# Patient Record
Sex: Male | Born: 1940
Health system: Southern US, Community
[De-identification: ages and names within clinical notes are randomized; demographics above are authoritative.]

## PROBLEM LIST (undated history)

## (undated) DIAGNOSIS — I10 Essential (primary) hypertension: Secondary | ICD-10-CM

## (undated) DIAGNOSIS — M51379 Other intervertebral disc degeneration, lumbosacral region without mention of lumbar back pain or lower extremity pain: Secondary | ICD-10-CM

## (undated) DIAGNOSIS — I82409 Acute embolism and thrombosis of unspecified deep veins of unspecified lower extremity: Secondary | ICD-10-CM

## (undated) DIAGNOSIS — E663 Overweight: Secondary | ICD-10-CM

## (undated) DIAGNOSIS — I2510 Atherosclerotic heart disease of native coronary artery without angina pectoris: Secondary | ICD-10-CM

## (undated) DIAGNOSIS — Z86718 Personal history of other venous thrombosis and embolism: Secondary | ICD-10-CM

## (undated) DIAGNOSIS — M7989 Other specified soft tissue disorders: Secondary | ICD-10-CM

## (undated) DIAGNOSIS — E785 Hyperlipidemia, unspecified: Secondary | ICD-10-CM

## (undated) DIAGNOSIS — N400 Enlarged prostate without lower urinary tract symptoms: Secondary | ICD-10-CM

## (undated) DIAGNOSIS — N401 Enlarged prostate with lower urinary tract symptoms: Secondary | ICD-10-CM

## (undated) DIAGNOSIS — I499 Cardiac arrhythmia, unspecified: Secondary | ICD-10-CM

## (undated) DIAGNOSIS — K5792 Diverticulitis of intestine, part unspecified, without perforation or abscess without bleeding: Secondary | ICD-10-CM

## (undated) DIAGNOSIS — Z7901 Long term (current) use of anticoagulants: Secondary | ICD-10-CM

## (undated) DIAGNOSIS — N529 Male erectile dysfunction, unspecified: Secondary | ICD-10-CM

## (undated) DIAGNOSIS — M5137 Other intervertebral disc degeneration, lumbosacral region: Secondary | ICD-10-CM

## (undated) DIAGNOSIS — I872 Venous insufficiency (chronic) (peripheral): Secondary | ICD-10-CM

## (undated) DIAGNOSIS — R351 Nocturia: Secondary | ICD-10-CM

## (undated) DIAGNOSIS — I639 Cerebral infarction, unspecified: Secondary | ICD-10-CM

## (undated) DIAGNOSIS — K219 Gastro-esophageal reflux disease without esophagitis: Secondary | ICD-10-CM

## (undated) DIAGNOSIS — L309 Dermatitis, unspecified: Secondary | ICD-10-CM

## (undated) HISTORY — DX: Essential (primary) hypertension: I10

## (undated) HISTORY — DX: Benign prostatic hyperplasia with lower urinary tract symptoms: N40.1

## (undated) HISTORY — DX: Hyperlipidemia, unspecified: E78.5

## (undated) HISTORY — DX: Benign prostatic hyperplasia without lower urinary tract symptoms: N40.0

## (undated) HISTORY — DX: Cerebral infarction, unspecified: I63.9

## (undated) HISTORY — PX: OTHER SURGICAL HISTORY: SHX169

## (undated) HISTORY — DX: Atherosclerotic heart disease of native coronary artery without angina pectoris: I25.10

## (undated) HISTORY — DX: Nocturia: R35.1

## (undated) HISTORY — DX: Gastro-esophageal reflux disease without esophagitis: K21.9

## (undated) HISTORY — PX: COLON SURGERY: SHX602

## (undated) HISTORY — DX: Dermatitis, unspecified: L30.9

## (undated) HISTORY — PX: EYE SURGERY: SHX253

## (undated) HISTORY — PX: CORONARY ANGIOPLASTY: SHX604

## (undated) HISTORY — PX: CARDIAC CATHETERIZATION: SHX172

## (undated) HISTORY — DX: Diverticulitis of intestine, part unspecified, without perforation or abscess without bleeding: K57.92

## (undated) HISTORY — DX: Other intervertebral disc degeneration, lumbosacral region without mention of lumbar back pain or lower extremity pain: M51.379

## (undated) HISTORY — DX: Venous insufficiency (chronic) (peripheral): I87.2

## (undated) HISTORY — DX: Long term (current) use of anticoagulants: Z79.01

## (undated) HISTORY — DX: Other specified soft tissue disorders: M79.89

## (undated) HISTORY — PX: TONSILLECTOMY AND ADENOIDECTOMY: SUR1326

## (undated) HISTORY — PX: NASAL SEPTUM SURGERY: SHX37

## (undated) HISTORY — DX: Other intervertebral disc degeneration, lumbosacral region: M51.37

## (undated) HISTORY — DX: Overweight: E66.3

## (undated) HISTORY — DX: Personal history of other venous thrombosis and embolism: Z86.718

## (undated) HISTORY — DX: Male erectile dysfunction, unspecified: N52.9

---

## 2000-11-15 ENCOUNTER — Encounter: Payer: Self-pay | Admitting: Gastroenterology

## 2000-11-15 ENCOUNTER — Encounter (INDEPENDENT_AMBULATORY_CARE_PROVIDER_SITE_OTHER): Payer: Self-pay

## 2000-11-15 ENCOUNTER — Other Ambulatory Visit: Admission: RE | Admit: 2000-11-15 | Discharge: 2000-11-15 | Payer: Self-pay | Admitting: Gastroenterology

## 2001-01-05 ENCOUNTER — Encounter: Payer: Self-pay | Admitting: Family Medicine

## 2001-01-05 ENCOUNTER — Inpatient Hospital Stay (HOSPITAL_COMMUNITY): Admission: EM | Admit: 2001-01-05 | Discharge: 2001-01-07 | Payer: Self-pay | Admitting: Emergency Medicine

## 2001-01-05 ENCOUNTER — Encounter: Admission: RE | Admit: 2001-01-05 | Discharge: 2001-01-05 | Payer: Self-pay | Admitting: Family Medicine

## 2004-03-29 ENCOUNTER — Ambulatory Visit: Payer: Self-pay | Admitting: Family Medicine

## 2004-04-19 ENCOUNTER — Ambulatory Visit: Payer: Self-pay | Admitting: Family Medicine

## 2004-04-30 ENCOUNTER — Ambulatory Visit: Payer: Self-pay | Admitting: Family Medicine

## 2004-06-07 ENCOUNTER — Ambulatory Visit: Payer: Self-pay | Admitting: Family Medicine

## 2004-12-28 ENCOUNTER — Ambulatory Visit: Payer: Self-pay | Admitting: Family Medicine

## 2005-01-04 ENCOUNTER — Ambulatory Visit: Payer: Self-pay | Admitting: Family Medicine

## 2005-07-13 ENCOUNTER — Ambulatory Visit: Payer: Self-pay | Admitting: Family Medicine

## 2005-07-18 ENCOUNTER — Ambulatory Visit: Payer: Self-pay | Admitting: Family Medicine

## 2005-07-20 ENCOUNTER — Ambulatory Visit: Payer: Self-pay | Admitting: Family Medicine

## 2005-07-25 ENCOUNTER — Ambulatory Visit: Payer: Self-pay | Admitting: Family Medicine

## 2005-08-04 ENCOUNTER — Ambulatory Visit: Payer: Self-pay | Admitting: Family Medicine

## 2005-08-23 ENCOUNTER — Ambulatory Visit: Payer: Self-pay | Admitting: Family Medicine

## 2005-08-26 ENCOUNTER — Ambulatory Visit: Payer: Self-pay | Admitting: Family Medicine

## 2005-09-01 ENCOUNTER — Ambulatory Visit: Payer: Self-pay | Admitting: Family Medicine

## 2005-10-03 ENCOUNTER — Ambulatory Visit: Payer: Self-pay | Admitting: Family Medicine

## 2005-10-27 ENCOUNTER — Ambulatory Visit: Payer: Self-pay | Admitting: Family Medicine

## 2005-11-17 ENCOUNTER — Ambulatory Visit: Payer: Self-pay | Admitting: Family Medicine

## 2006-01-10 ENCOUNTER — Ambulatory Visit: Payer: Self-pay | Admitting: Family Medicine

## 2006-01-10 LAB — CONVERTED CEMR LAB
ALT: 25 units/L (ref 0–40)
AST: 31 units/L (ref 0–37)
Albumin: 3.8 g/dL (ref 3.5–5.2)
Alkaline Phosphatase: 44 units/L (ref 39–117)
BUN: 12 mg/dL (ref 6–23)
Basophils Absolute: 0 10*3/uL (ref 0.0–0.1)
Basophils Relative: 0.9 % (ref 0.0–1.0)
CO2: 27 meq/L (ref 19–32)
Calcium: 9.5 mg/dL (ref 8.4–10.5)
Chloride: 109 meq/L (ref 96–112)
Chol/HDL Ratio, serum: 2.6
Cholesterol: 144 mg/dL (ref 0–200)
Creatinine, Ser: 0.9 mg/dL (ref 0.4–1.5)
Eosinophil percent: 2 % (ref 0.0–5.0)
GFR calc non Af Amer: 90 mL/min
Glomerular Filtration Rate, Af Am: 109 mL/min/{1.73_m2}
Glucose, Bld: 94 mg/dL (ref 70–99)
HCT: 44.3 % (ref 39.0–52.0)
HDL: 54.4 mg/dL (ref >39.0)
Hemoglobin: 15 g/dL (ref 13.0–17.0)
LDL Cholesterol: 76 mg/dL (ref 0–99)
Lymphocytes Relative: 30.3 % (ref 12.0–46.0)
MCHC: 33.8 g/dL (ref 30.0–36.0)
MCV: 84.5 fL (ref 78.0–100.0)
Monocytes Absolute: 0.5 10*3/uL (ref 0.2–0.7)
Monocytes Relative: 12 % — ABNORMAL HIGH (ref 3.0–11.0)
Neutro Abs: 2.5 10*3/uL (ref 1.4–7.7)
Neutrophils Relative %: 54.8 % (ref 43.0–77.0)
PSA: 1.12 ng/mL
PSA: 1.12 ng/mL
PSA: 1.12 ng/mL (ref 0.10–4.00)
Platelets: 198 10*3/uL (ref 150–400)
Potassium: 4.1 meq/L (ref 3.5–5.1)
RBC: 5.25 M/uL (ref 4.22–5.81)
RDW: 14 % (ref 11.5–14.6)
Sodium: 142 meq/L (ref 135–145)
TSH: 1.68 microintl units/mL (ref 0.35–5.50)
Total Bilirubin: 0.9 mg/dL (ref 0.3–1.2)
Total Protein: 6.3 g/dL (ref 6.0–8.3)
Triglyceride fasting, serum: 68 mg/dL (ref 0–149)
VLDL: 14 mg/dL (ref 0–40)
WBC: 4.5 10*3/uL (ref 4.5–10.5)

## 2006-01-16 ENCOUNTER — Ambulatory Visit: Payer: Self-pay | Admitting: Family Medicine

## 2006-08-04 ENCOUNTER — Encounter: Payer: Self-pay | Admitting: Family Medicine

## 2006-08-04 DIAGNOSIS — K219 Gastro-esophageal reflux disease without esophagitis: Secondary | ICD-10-CM | POA: Insufficient documentation

## 2006-08-04 DIAGNOSIS — E785 Hyperlipidemia, unspecified: Secondary | ICD-10-CM | POA: Insufficient documentation

## 2006-08-04 DIAGNOSIS — I10 Essential (primary) hypertension: Secondary | ICD-10-CM | POA: Insufficient documentation

## 2006-08-04 HISTORY — DX: Gastro-esophageal reflux disease without esophagitis: K21.9

## 2006-09-13 ENCOUNTER — Ambulatory Visit: Payer: Self-pay | Admitting: Family Medicine

## 2006-10-25 ENCOUNTER — Ambulatory Visit: Payer: Self-pay | Admitting: Family Medicine

## 2006-10-25 DIAGNOSIS — I82409 Acute embolism and thrombosis of unspecified deep veins of unspecified lower extremity: Secondary | ICD-10-CM | POA: Insufficient documentation

## 2006-10-25 LAB — CONVERTED CEMR LAB
INR: 2.5
Prothrombin Time: 19.2 s

## 2006-11-24 ENCOUNTER — Ambulatory Visit: Payer: Self-pay | Admitting: Family Medicine

## 2006-11-24 LAB — CONVERTED CEMR LAB
INR: 1.9
Prothrombin Time: 16.8 s

## 2006-12-27 ENCOUNTER — Ambulatory Visit: Payer: Self-pay | Admitting: Family Medicine

## 2006-12-27 LAB — CONVERTED CEMR LAB
INR: 2.2
Prothrombin Time: 18 s

## 2007-02-13 ENCOUNTER — Ambulatory Visit: Payer: Self-pay | Admitting: Family Medicine

## 2007-02-13 IMAGING — CR DG LUMBAR SPINE COMPLETE 4+V
5 series · 5 of 5 positions shown · non-contrast
Comparison: none

This report is delayed due to PACS failure.
CLINICAL DATA: Injured several weeks ago with right-sided pain radiating down right leg.
 LUMBAR SPINE ? 4 VIEW:

[view not recorded (1 of 5)]
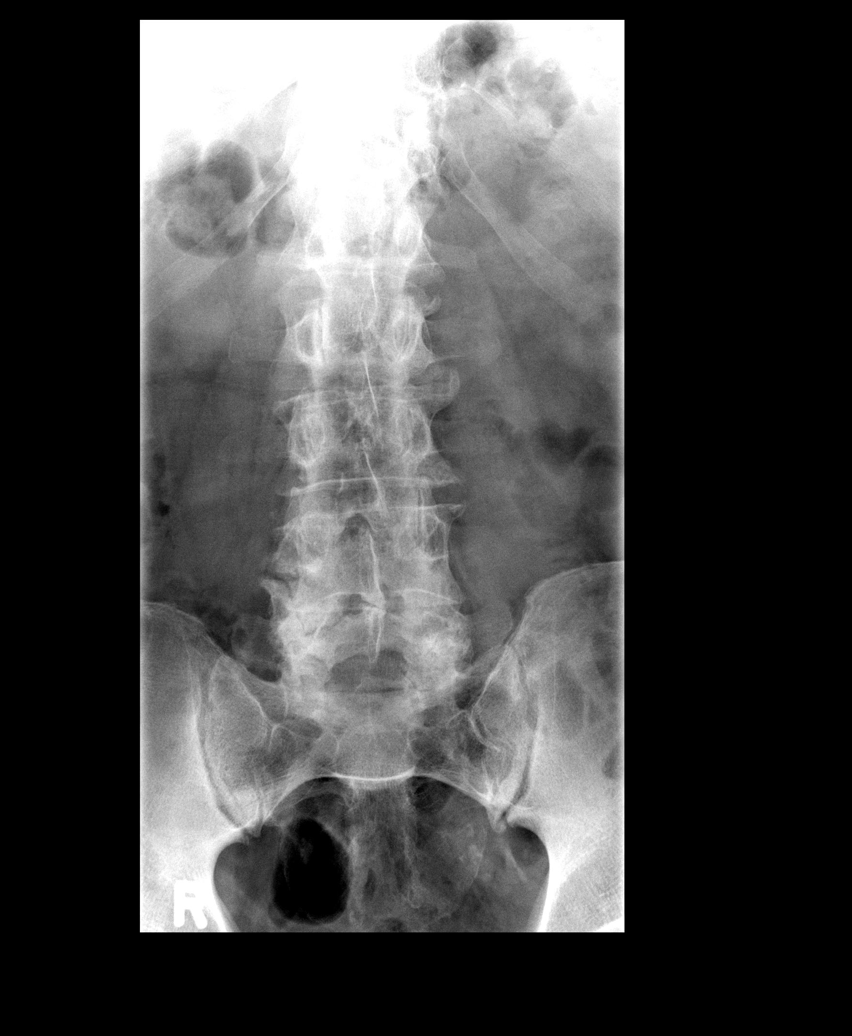

[view not recorded (2 of 5)]
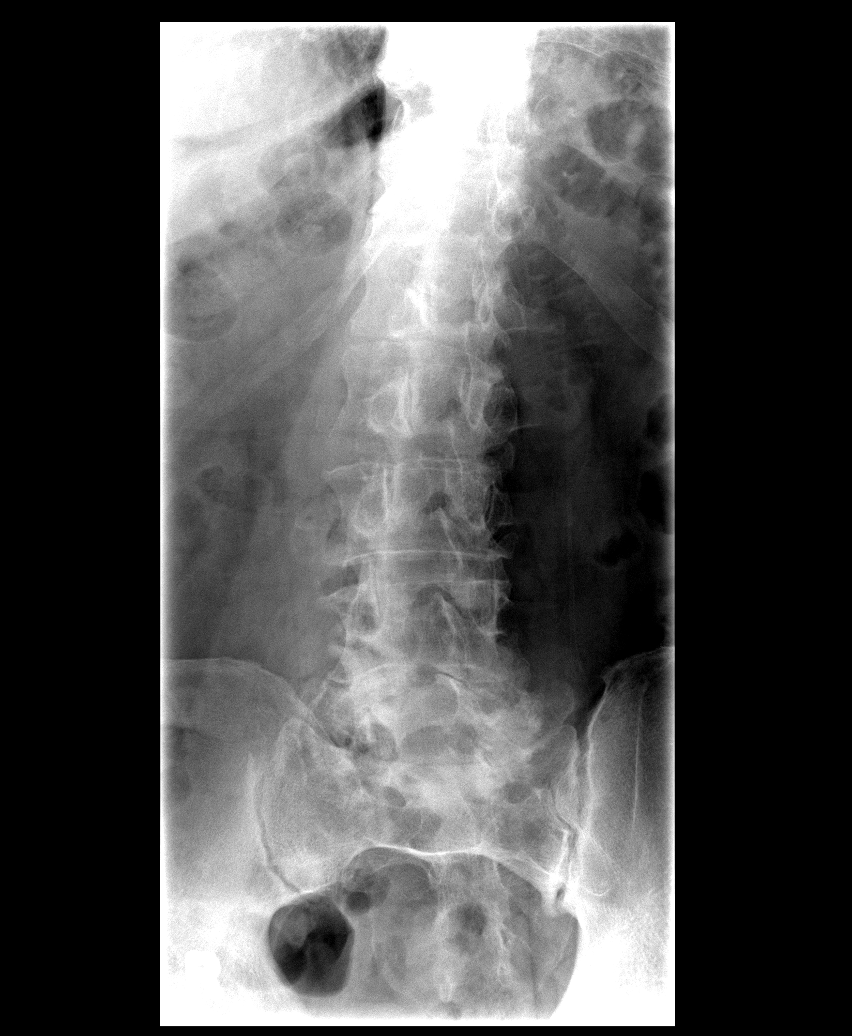

[view not recorded (3 of 5)]
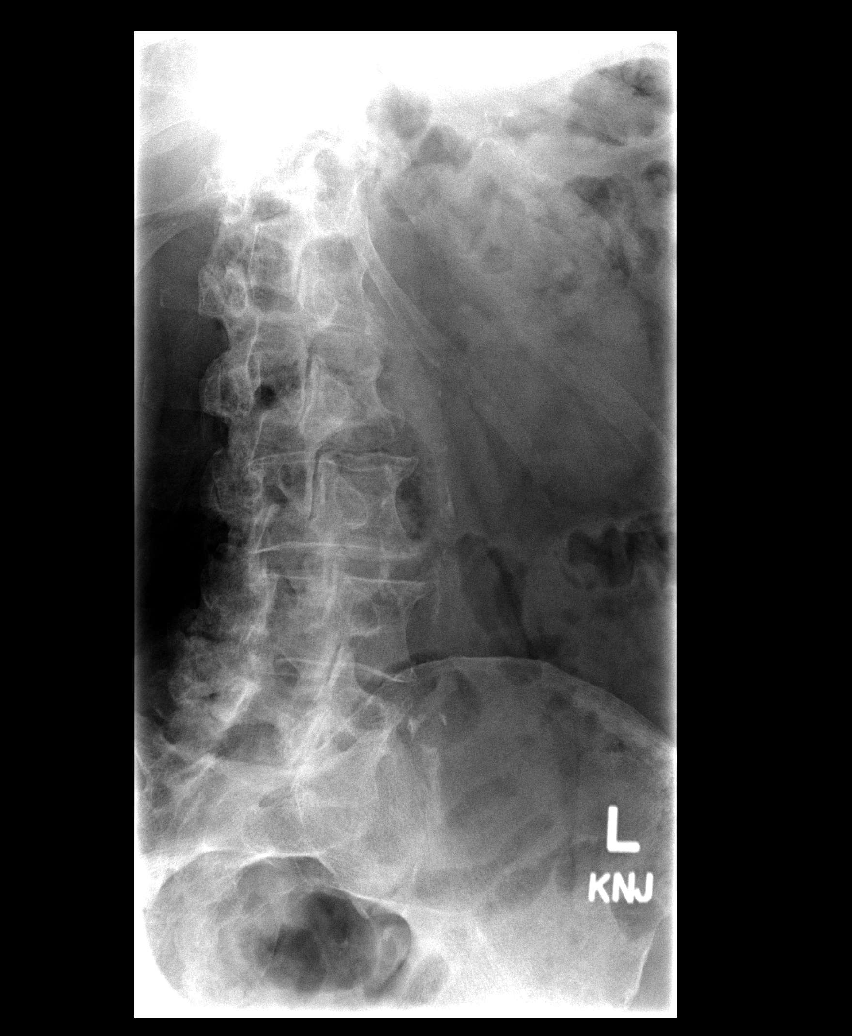

[view not recorded (4 of 5)]
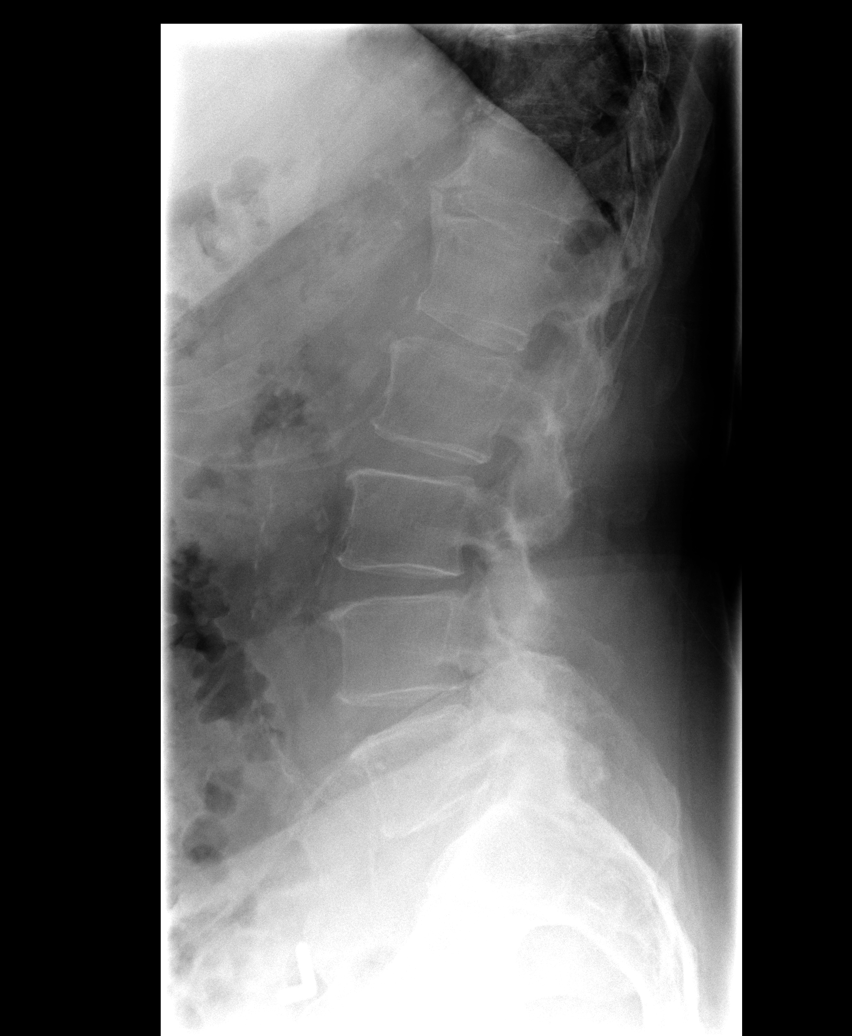

[view not recorded (5 of 5)]
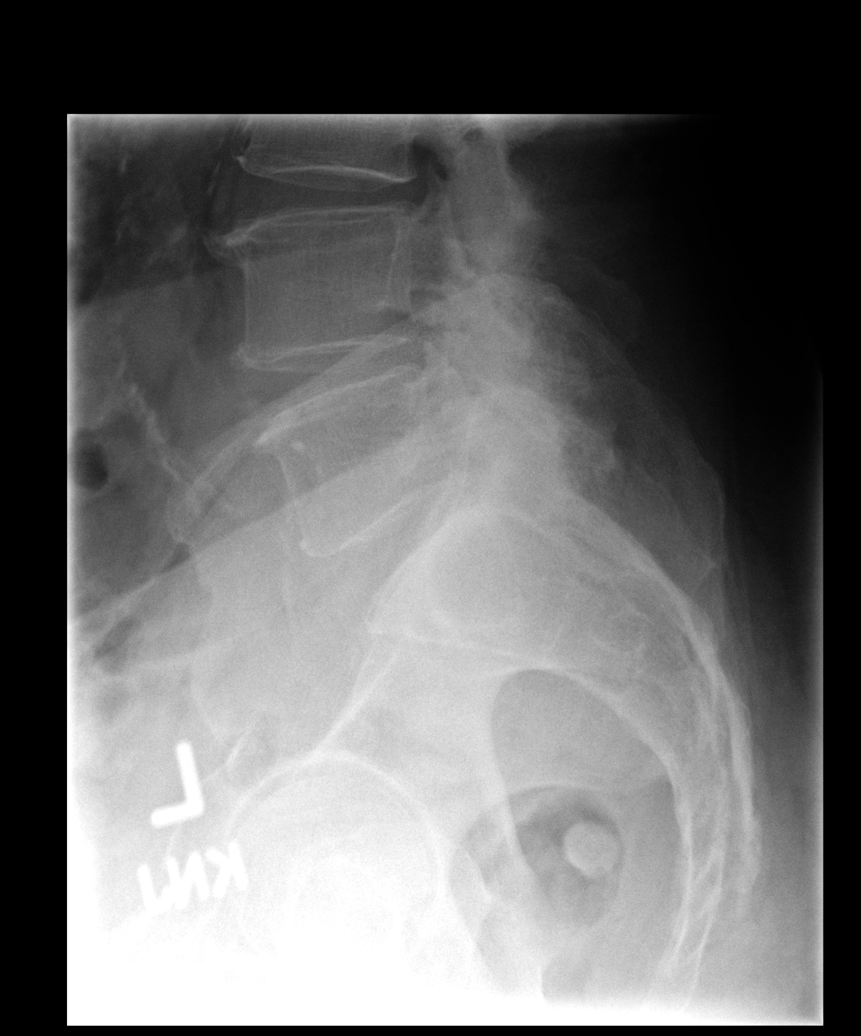

[5 of 5 positions shown; findings below may reference images not displayed]

FINDINGS: Four views of the lumbar spine were obtained.  The lumbar vertebrae are in normal alignment.  No compression deformity is seen. There is degenerative change involving the facet joints particularly of L4-5 and L5-S1. The SI joints appear normal.
IMPRESSION: 1.  Normal alignment with no acute abnormality.
 2.  Degenerative change involves facet joints of L4-5 and L5-S1.

## 2007-02-14 DIAGNOSIS — M5137 Other intervertebral disc degeneration, lumbosacral region: Secondary | ICD-10-CM | POA: Insufficient documentation

## 2007-02-14 DIAGNOSIS — M5416 Radiculopathy, lumbar region: Secondary | ICD-10-CM | POA: Insufficient documentation

## 2007-02-20 ENCOUNTER — Ambulatory Visit: Payer: Self-pay | Admitting: Family Medicine

## 2007-05-04 ENCOUNTER — Encounter: Payer: Self-pay | Admitting: Family Medicine

## 2007-05-07 ENCOUNTER — Ambulatory Visit: Payer: Self-pay | Admitting: Family Medicine

## 2007-05-07 LAB — CONVERTED CEMR LAB
INR: 1.6
Prothrombin Time: 15.7 s

## 2007-08-09 ENCOUNTER — Ambulatory Visit: Payer: Self-pay | Admitting: Family Medicine

## 2007-08-09 LAB — CONVERTED CEMR LAB
INR: 1.8
Prothrombin Time: 16.7 s

## 2007-08-28 ENCOUNTER — Ambulatory Visit: Payer: Self-pay | Admitting: Family Medicine

## 2007-08-28 LAB — CONVERTED CEMR LAB: Zinc: 1012 (ref 600–1200)

## 2007-11-12 ENCOUNTER — Ambulatory Visit: Payer: Self-pay | Admitting: Family Medicine

## 2007-11-12 LAB — CONVERTED CEMR LAB
INR: 2
Prothrombin Time: 17.5 s

## 2007-11-20 ENCOUNTER — Ambulatory Visit: Payer: Self-pay | Admitting: Family Medicine

## 2008-02-18 ENCOUNTER — Ambulatory Visit: Payer: Self-pay | Admitting: Family Medicine

## 2008-02-27 ENCOUNTER — Ambulatory Visit: Payer: Self-pay | Admitting: Family Medicine

## 2008-02-29 ENCOUNTER — Ambulatory Visit: Payer: Self-pay | Admitting: Family Medicine

## 2008-02-29 DIAGNOSIS — L02818 Cutaneous abscess of other sites: Secondary | ICD-10-CM | POA: Insufficient documentation

## 2008-02-29 DIAGNOSIS — L03818 Cellulitis of other sites: Secondary | ICD-10-CM

## 2008-03-03 ENCOUNTER — Ambulatory Visit: Payer: Self-pay | Admitting: Family Medicine

## 2008-03-10 ENCOUNTER — Ambulatory Visit: Payer: Self-pay | Admitting: Family Medicine

## 2008-03-10 DIAGNOSIS — R609 Edema, unspecified: Secondary | ICD-10-CM | POA: Insufficient documentation

## 2008-03-12 DIAGNOSIS — D126 Benign neoplasm of colon, unspecified: Secondary | ICD-10-CM | POA: Insufficient documentation

## 2008-03-12 DIAGNOSIS — K5730 Diverticulosis of large intestine without perforation or abscess without bleeding: Secondary | ICD-10-CM | POA: Insufficient documentation

## 2008-03-17 ENCOUNTER — Ambulatory Visit: Payer: Self-pay | Admitting: Gastroenterology

## 2008-03-17 DIAGNOSIS — D68318 Other hemorrhagic disorder due to intrinsic circulating anticoagulants, antibodies, or inhibitors: Secondary | ICD-10-CM | POA: Insufficient documentation

## 2008-03-24 ENCOUNTER — Ambulatory Visit: Payer: Self-pay | Admitting: Family Medicine

## 2008-03-25 ENCOUNTER — Ambulatory Visit: Payer: Self-pay | Admitting: Gastroenterology

## 2008-03-25 ENCOUNTER — Telehealth: Payer: Self-pay | Admitting: Gastroenterology

## 2008-03-27 LAB — CONVERTED CEMR LAB
BUN: 13 mg/dL (ref 6–23)
CO2: 30 meq/L (ref 19–32)
Calcium: 9.9 mg/dL (ref 8.4–10.5)
Chloride: 106 meq/L (ref 96–112)
Creatinine, Ser: 1 mg/dL (ref 0.4–1.5)
GFR calc Af Amer: 96 mL/min
GFR calc non Af Amer: 79 mL/min
Glucose, Bld: 75 mg/dL (ref 70–99)
Potassium: 4.9 meq/L (ref 3.5–5.1)
Sodium: 142 meq/L (ref 135–145)

## 2008-08-07 ENCOUNTER — Ambulatory Visit: Payer: Self-pay | Admitting: Internal Medicine

## 2008-08-07 ENCOUNTER — Inpatient Hospital Stay (HOSPITAL_COMMUNITY): Admission: EM | Admit: 2008-08-07 | Discharge: 2008-08-20 | Payer: Self-pay | Admitting: Emergency Medicine

## 2008-08-07 ENCOUNTER — Encounter (INDEPENDENT_AMBULATORY_CARE_PROVIDER_SITE_OTHER): Payer: Self-pay | Admitting: General Surgery

## 2008-08-07 IMAGING — CT CT PELVIS W/ CM
2 of 5 series · 13 of 32 positions shown, 18 images · IV contrast (water & 100 ML OMNI 300)
Comparison: None.

CT ABDOMEN

CLINICAL DATA: Severe mid to lower abdominal pain and nausea.
Fever.

CT ABDOMEN AND PELVIS WITH CONTRAST  [DATE]:
TECHNIQUE: Multidetector CT imaging of the abdomen and pelvis was
performed using the standard protocol following bolus
administration of intravenous contrast.
Contrast: 100 ml [WX] IV.  Water as oral contrast.

[Series 2: routine abdomen · axial · 0.88mm/px · z∈[-441,-91]mm · 5 of 103 slices shown, 10 images]
[im 18/103  soft-tissue]
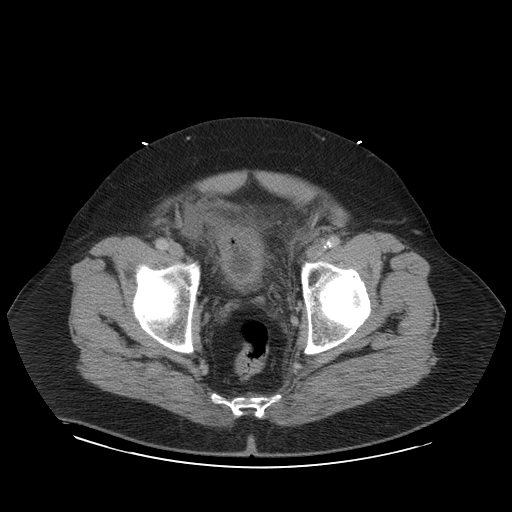
[im 18/103  bone]
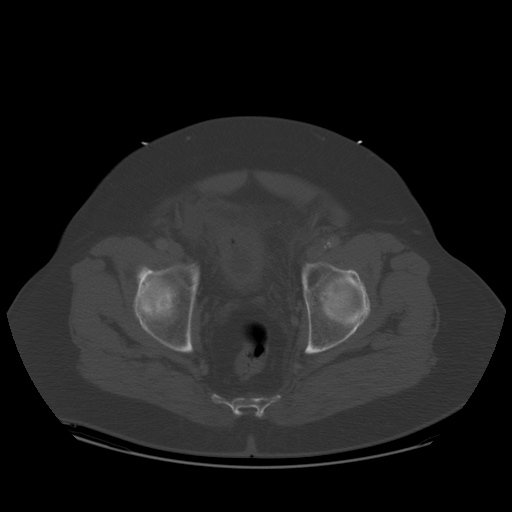
[im 35/103  soft-tissue]
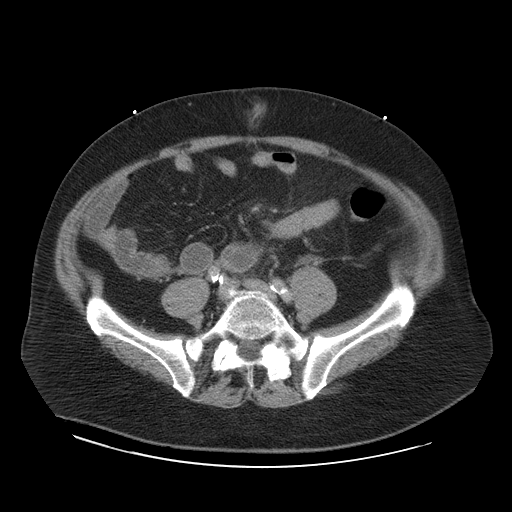
[im 35/103  lung]
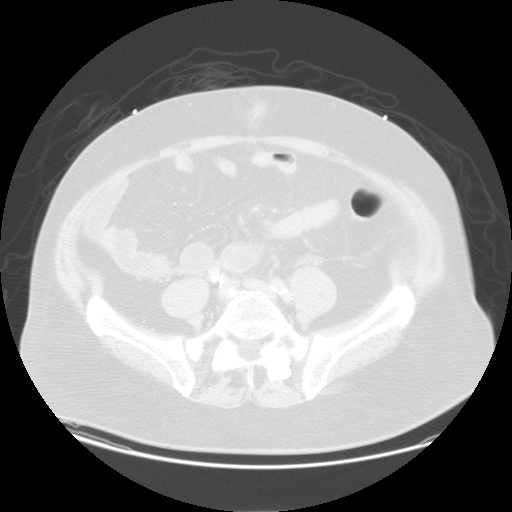
[im 52/103  soft-tissue]
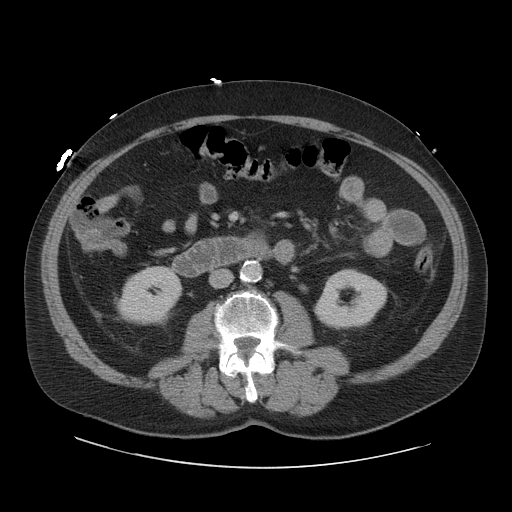
[im 52/103  lung]
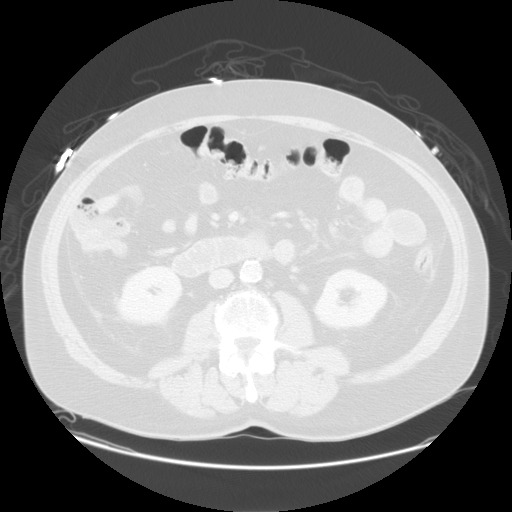
[im 69/103  soft-tissue]
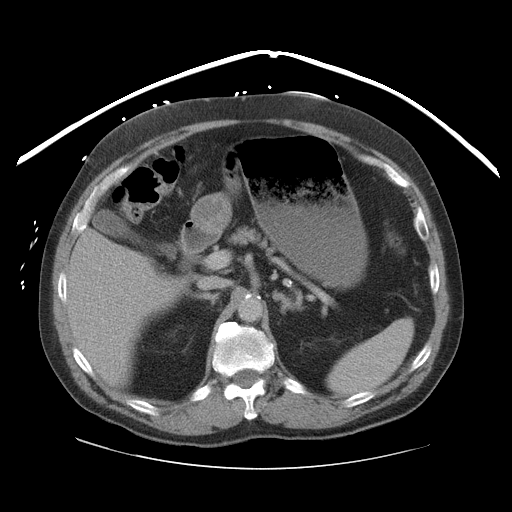
[im 69/103  lung]
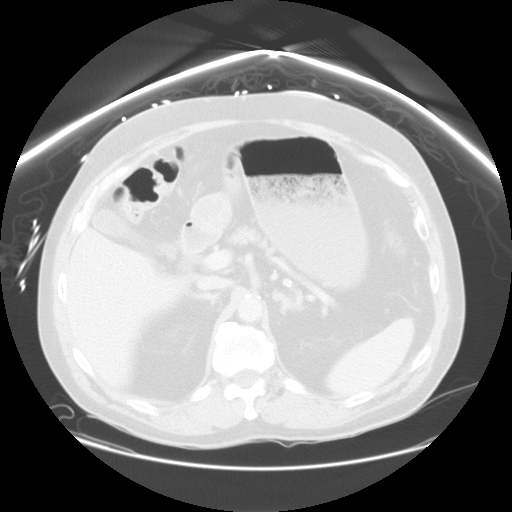
[im 86/103  soft-tissue]
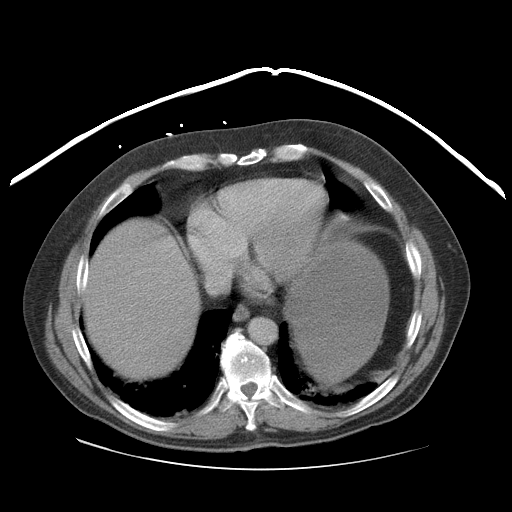
[im 86/103  lung]
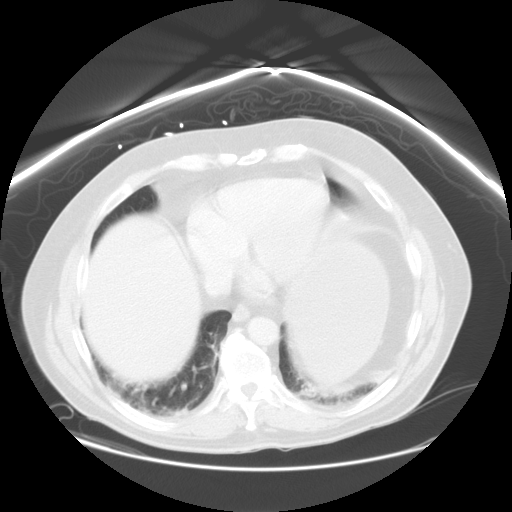

[Series 401: reformatted · sagittal · 1.08mm/px · 8 of 142 slices shown]
[im 16/142  soft-tissue]
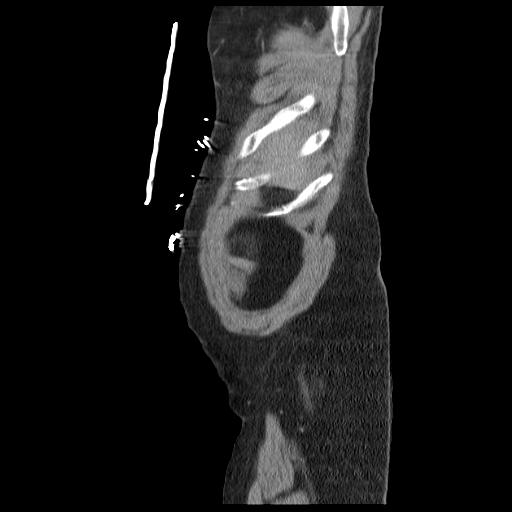
[im 32/142  soft-tissue]
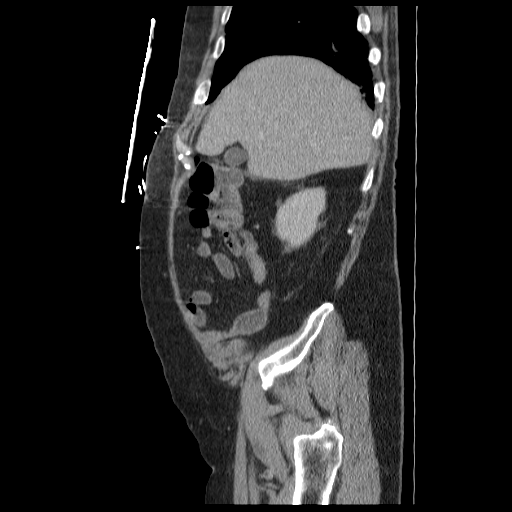
[im 48/142  soft-tissue]
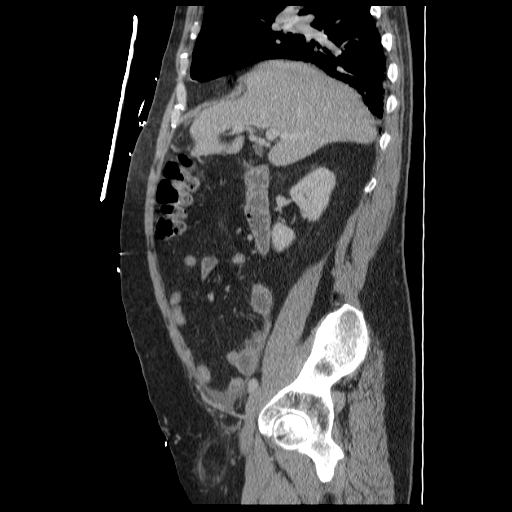
[im 63/142  soft-tissue]
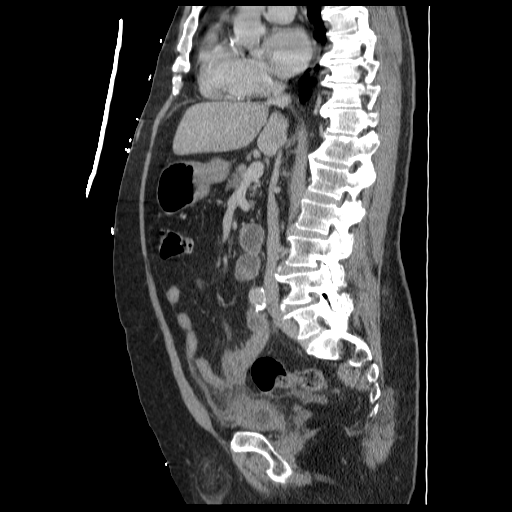
[im 79/142  soft-tissue]
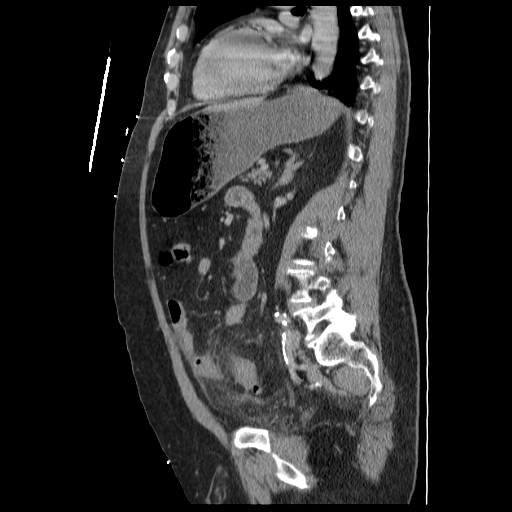
[im 95/142  soft-tissue]
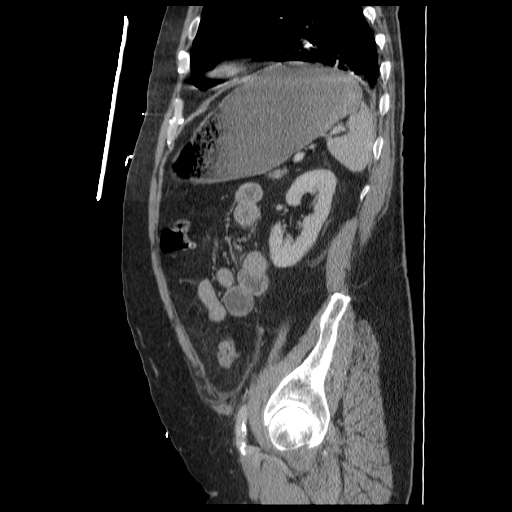
[im 110/142  soft-tissue]
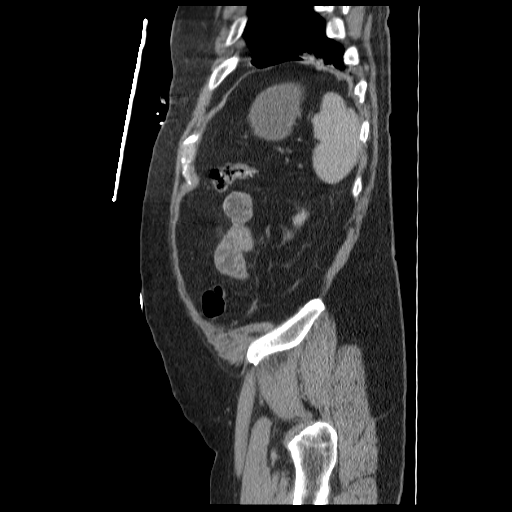
[im 126/142  soft-tissue]
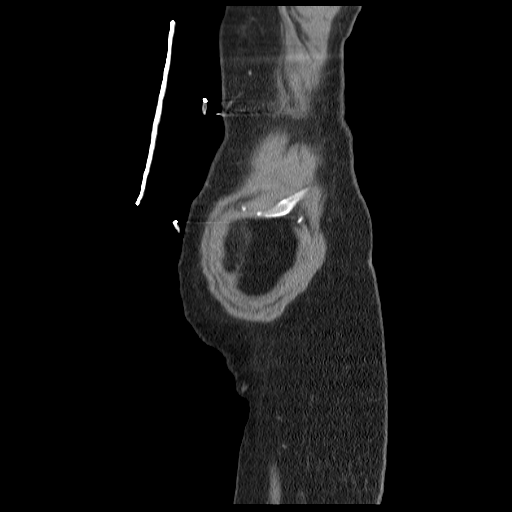

[13 of 32 positions shown; findings below may reference images not displayed]

FINDINGS: Normal appearing liver, spleen, adrenal glands, and the
left kidney.  Mild pancreatic atrophy without focal pancreatic
parenchymal abnormality.  Simple cyst in the lower pole of the
right kidney approximating 2 cm; no significant abnormality
involving the right kidney.  Gallbladder unremarkable by CT.  No
biliary ductal dilation.  Food within a normal-appearing stomach.
Scattered colonic diverticula without evidence of acute
diverticulitis.  Cecum positioned in the right upper quadrant of
the abdomen, and normal appearing appendix identified in the right
mid abdomen.  Visualized small bowel unremarkable.  Aortic
atherosclerosis without aneurysm.  No significant lymphadenopathy.
No ascites.  Emphysematous changes in the lower lobes with
atelectasis and/or scarring.  Bone window images demonstrate lower
thoracic spondylosis and degenerative changes diffusely throughout
the lumbar spine.
IMPRESSION: 1.  No acute abnormalities in the abdomen.
2.  Diffuse colonic diverticulosis.
3.  Mild pancreatic atrophy.
4.  Atelectasis and/or scarring in the lower lobes bilaterally.

CT PELVIS
FINDINGS: Wall thickening and associated inflammation/edema
involving a loop of the distal small bowel in the lower pelvis.
This loop is adjacent to the sigmoid colon, and there are numerous
sigmoid diverticula; however, the inflammatory disease does appear
to arise from the small bowel loop and not the colon.  No evidence
of bowel obstruction.  No extraluminal gas.  No abnormal fluid
collection to suggest abscess.  Urinary bladder decompressed by
Foley catheter.  Mild prostate gland enlargement consistent with
age.  Normal seminal vesicles.  Left inguinal hernia containing fat
and an approximate to 3.1 x 3.9 cm fluid collection.  No ascites.
No significant lymphadenopathy.  Iliofemoral atherosclerosis
without aneurysm.  Bone window images demonstrate mild degenerative
changes in the sacroiliac joints.
IMPRESSION: 1.  Marked inflammation involving a loop of distal small bowel in
the mid pelvis, with and associated phlegmon.  No evidence of
abscess.  This is likely either infectious in etiology or a
manifestation of inflammatory bowel disease. Focal ischemia is
possible, though the main and visceral arteries are all patent.
2.  Sigmoid colon diverticulosis without evidence of acute
diverticulitis.
3.  Right inguinal hernia containing fat and a small fluid
collection, measured above.  This could represent either a seroma
or a liquefied undescended testicle on the right.  Please correlate
clinically.
4.  Mild prostate gland enlargement consistent with age.

## 2008-08-09 IMAGING — CR DG CHEST 1V PORT
1 series · 1 of 1 positions shown · non-contrast
Comparison: None

CLINICAL DATA: CHF.

PORTABLE CHEST - 1 VIEW

[view not recorded]
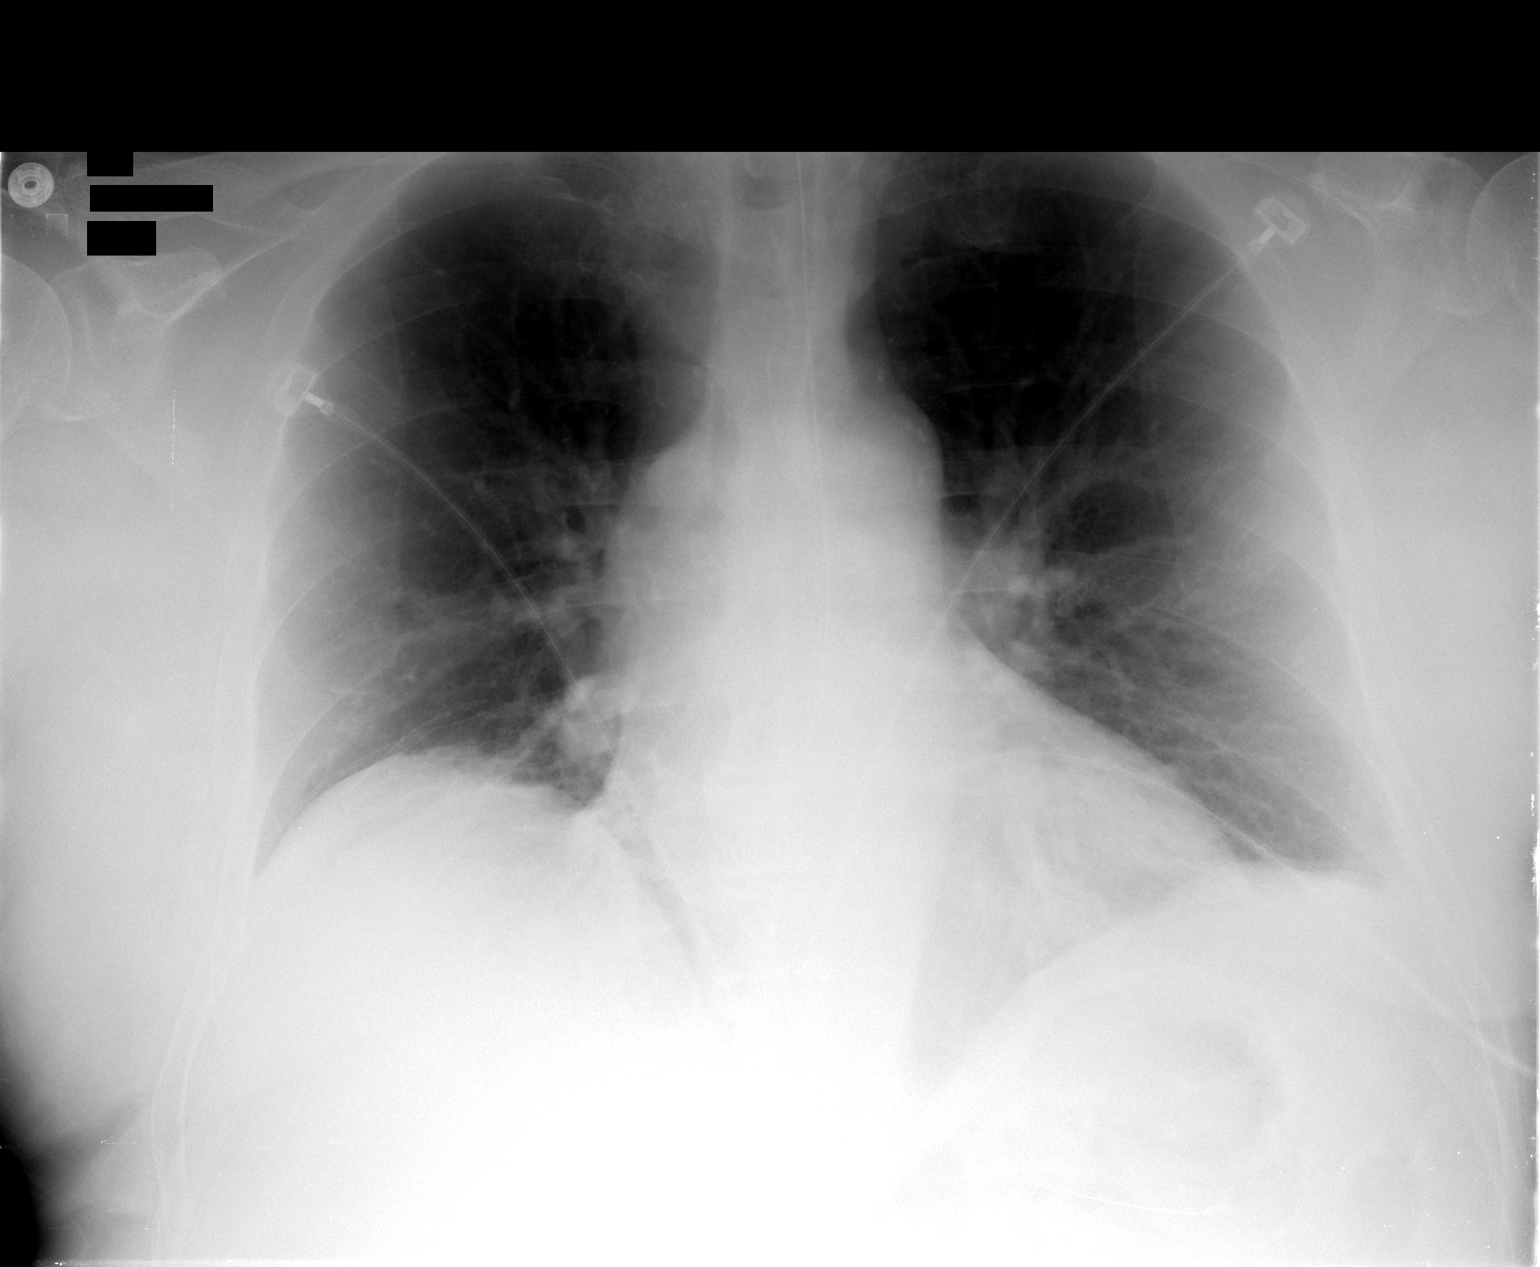

[1 of 1 positions shown; findings below may reference images not displayed]

FINDINGS: Single view of the chest demonstrates a nasogastric tube
that terminates in the proximal stomach near the fundus.  The lungs
are essentially clear.  There may be a small amount of basilar
atelectasis.  Heart size is within normal limits.  No evidence for
pulmonary edema.  Trachea is midline.
IMPRESSION: Nasogastric tube tip in the proximal stomach.

Left basilar atelectasis.

## 2008-08-22 ENCOUNTER — Ambulatory Visit: Payer: Self-pay | Admitting: Family Medicine

## 2008-08-22 LAB — CONVERTED CEMR LAB
INR: 1.3
Prothrombin Time: 14.3 s

## 2008-09-02 ENCOUNTER — Encounter: Payer: Self-pay | Admitting: Gastroenterology

## 2008-10-21 ENCOUNTER — Encounter: Payer: Self-pay | Admitting: Gastroenterology

## 2008-11-18 ENCOUNTER — Encounter: Payer: Self-pay | Admitting: Gastroenterology

## 2008-11-24 ENCOUNTER — Telehealth: Payer: Self-pay | Admitting: Gastroenterology

## 2008-12-11 ENCOUNTER — Telehealth: Payer: Self-pay | Admitting: Gastroenterology

## 2008-12-11 ENCOUNTER — Telehealth (INDEPENDENT_AMBULATORY_CARE_PROVIDER_SITE_OTHER): Payer: Self-pay

## 2008-12-12 ENCOUNTER — Ambulatory Visit: Payer: Self-pay | Admitting: Gastroenterology

## 2008-12-15 ENCOUNTER — Encounter (INDEPENDENT_AMBULATORY_CARE_PROVIDER_SITE_OTHER): Payer: Self-pay

## 2008-12-24 ENCOUNTER — Telehealth: Payer: Self-pay | Admitting: Gastroenterology

## 2008-12-25 ENCOUNTER — Encounter: Payer: Self-pay | Admitting: Gastroenterology

## 2008-12-25 ENCOUNTER — Ambulatory Visit: Payer: Self-pay | Admitting: Gastroenterology

## 2008-12-25 LAB — HM COLONOSCOPY

## 2008-12-29 ENCOUNTER — Encounter: Payer: Self-pay | Admitting: Gastroenterology

## 2008-12-29 ENCOUNTER — Encounter: Admission: RE | Admit: 2008-12-29 | Discharge: 2008-12-29 | Payer: Self-pay | Admitting: General Surgery

## 2008-12-29 IMAGING — CR DG ABDOMEN 1V
1 series · 1 of 1 positions shown · non-contrast
Comparison: CT scan from [DATE]

CLINICAL DATA: Preop evaluation for colostomy takedown

ABDOMEN - 1 VIEW

[view not recorded]
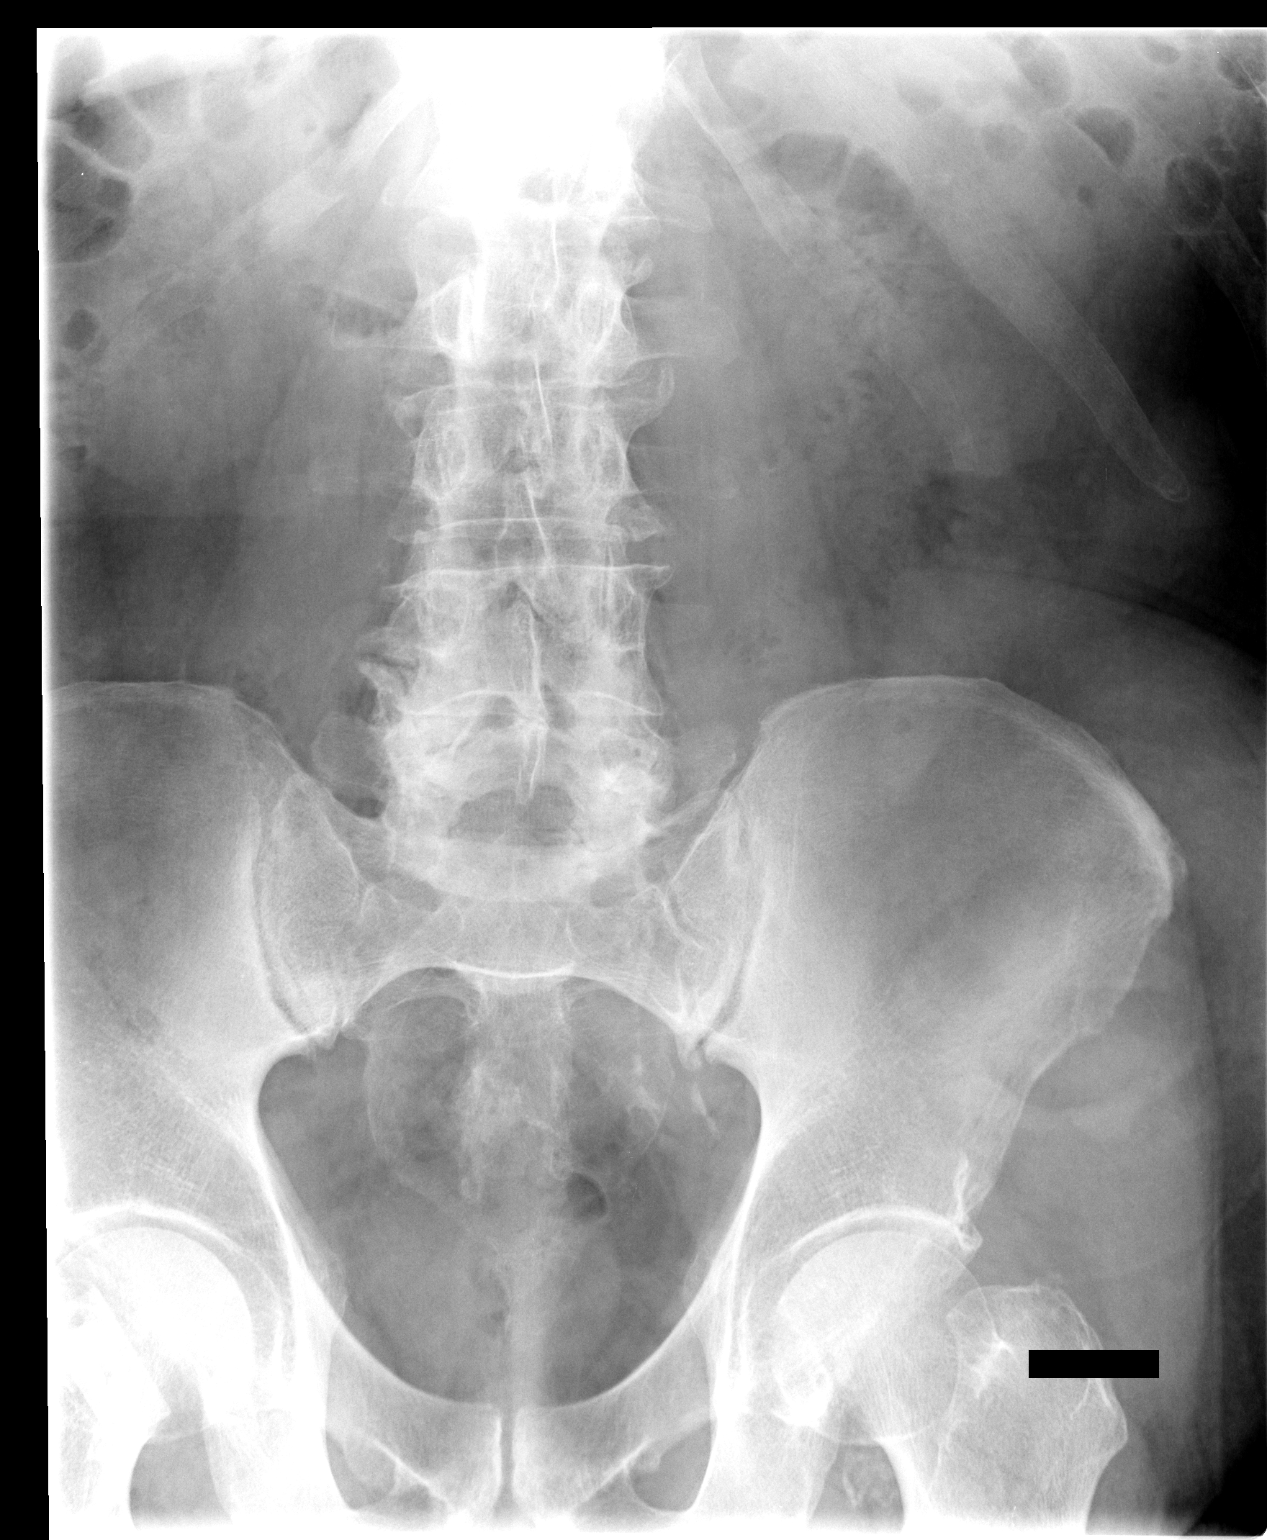

[1 of 1 positions shown; findings below may reference images not displayed]

FINDINGS: The the patient was scheduled for barium enema, but is
status post multiple colonic biopsies taken during colonoscopy 4
days ago.  For this reason, a barium enema was not performed today.
The the patient was rescheduled for repeat barium enema. KUB
obtained prior to the procedure shows a normal bowel gas pattern.
Stoma is visible in the left lower quadrant.
IMPRESSION: Normal bowel gas pattern.

## 2009-01-12 ENCOUNTER — Ambulatory Visit: Payer: Self-pay | Admitting: Family Medicine

## 2009-01-12 ENCOUNTER — Encounter: Payer: Self-pay | Admitting: Gastroenterology

## 2009-01-12 LAB — CONVERTED CEMR LAB
INR: 2.1
Prothrombin Time: 17.8 s

## 2009-01-20 ENCOUNTER — Encounter: Admission: RE | Admit: 2009-01-20 | Discharge: 2009-01-20 | Payer: Self-pay | Admitting: General Surgery

## 2009-01-20 IMAGING — RF DG BE THRU COLOSTOMY
14 of 24 series · 14 of 24 positions shown · IV contrast (agent unspecified)
Comparison: [HOSPITAL] at [REDACTED] [HOSPITAL] abdominal
radiograph [DATE] and [HOSPITAL] abdominal pelvic CT
[DATE].

CLINICAL DATA: Preoperative study for reanastomosis.  Previous
diverticulitis.

SINGLE CONTRAST BARIUM ENEMA
Fluoroscopy Time: 1.6 minutes.
Contrast: Single contrast barium instilled per rectum with balloon
catheter.  Subsequent barium single contrast retrograde
administered via the colostomy.

[Series 1: run · 1 of 1 slices shown (1 of 10)]
[im 1/1]
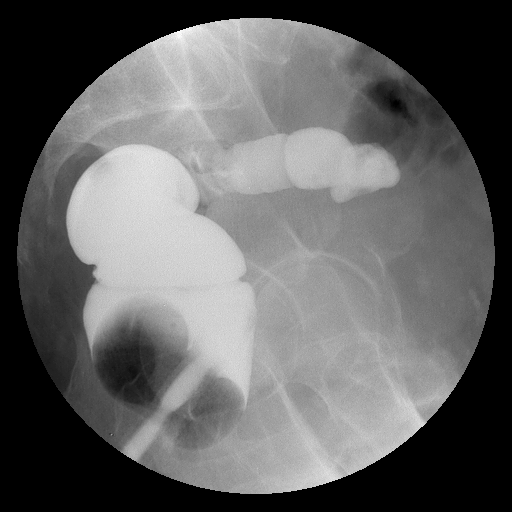

[Series 3: run · 1 of 1 slices shown (2 of 10)]
[im 1/1]
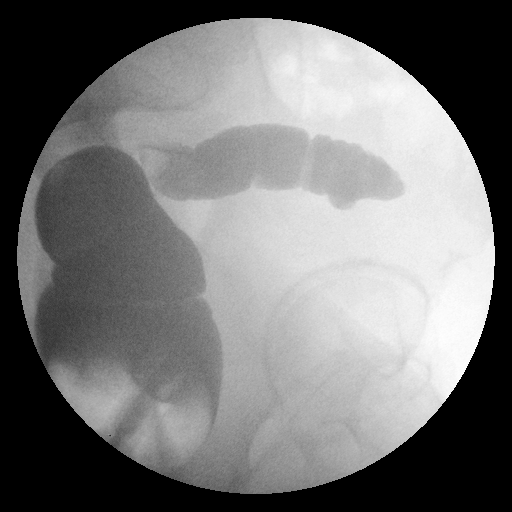

[Series 5: run · 1 of 1 slices shown (3 of 10)]
[im 1/1]
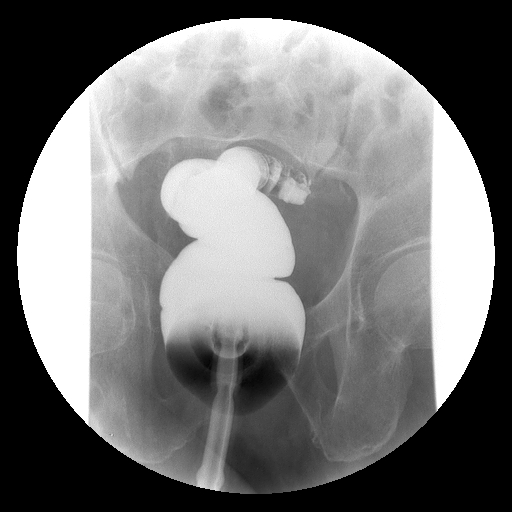

[Series 7: run · 1 of 1 slices shown (4 of 10)]
[im 1/1]
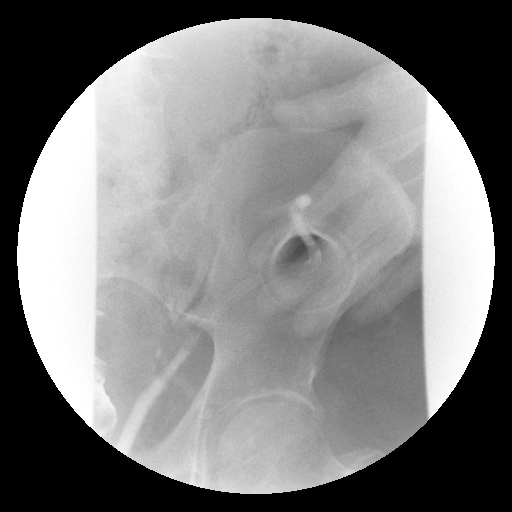

[Series 8: run · 1 of 1 slices shown (5 of 10)]
[im 1/1]
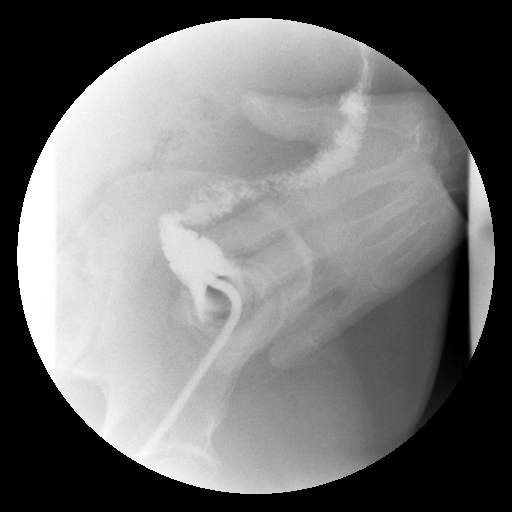

[Series 10: run · 1 of 1 slices shown (6 of 10)]
[im 1/1]
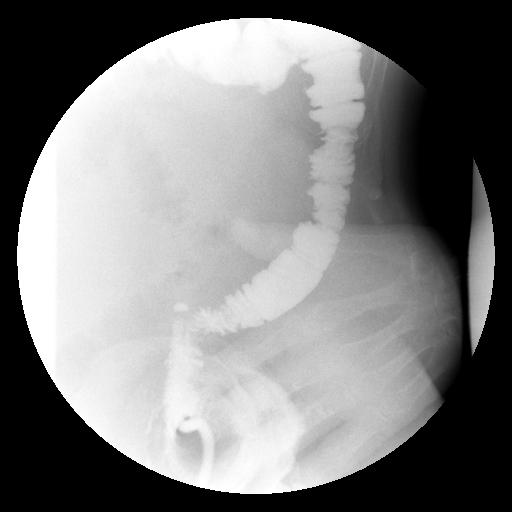

[Series 12: run · 1 of 1 slices shown (7 of 10)]
[im 1/1]
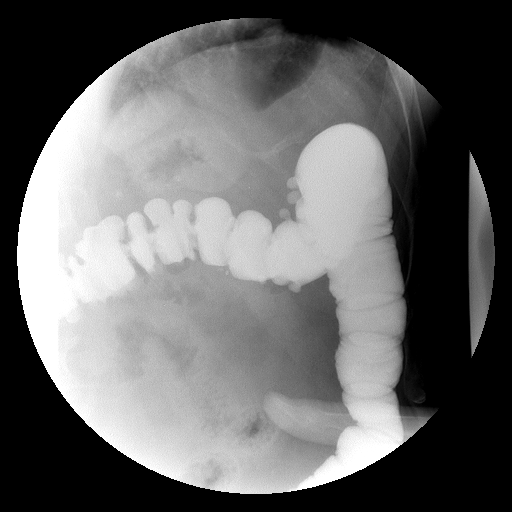

[Series 13: run · 1 of 1 slices shown (8 of 10)]
[im 1/1]
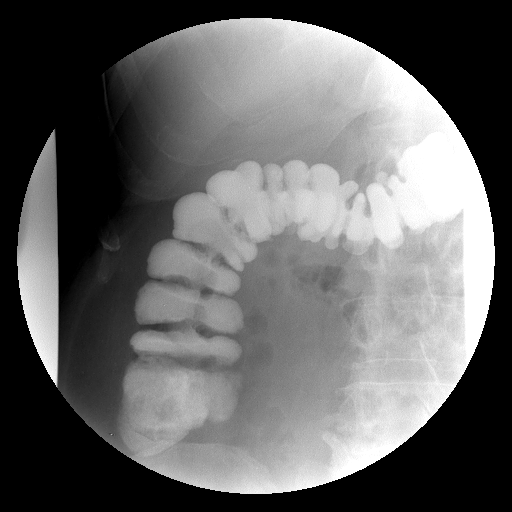

[Series 15: run · 1 of 1 slices shown (9 of 10)]
[im 1/1]
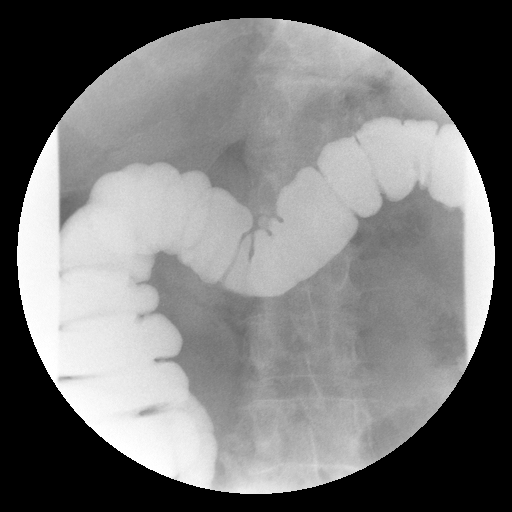

[Series 17: run · 1 of 1 slices shown (10 of 10)]
[im 1/1]
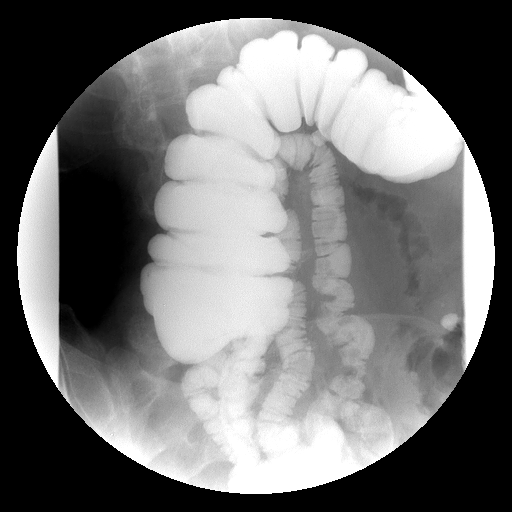

[Series 1002: view not recorded · 0.20mm/px · 1 of 1 slices shown (1 of 4)]
[im 1/1]
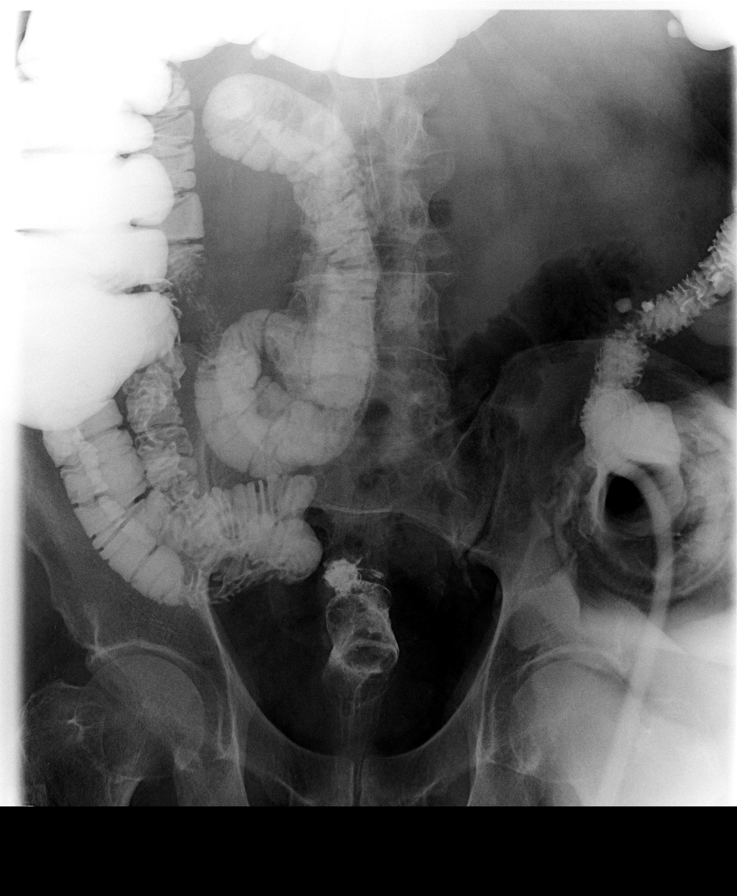

[Series 1003: view not recorded · 0.20mm/px · 1 of 1 slices shown (2 of 4)]
[im 1/1]
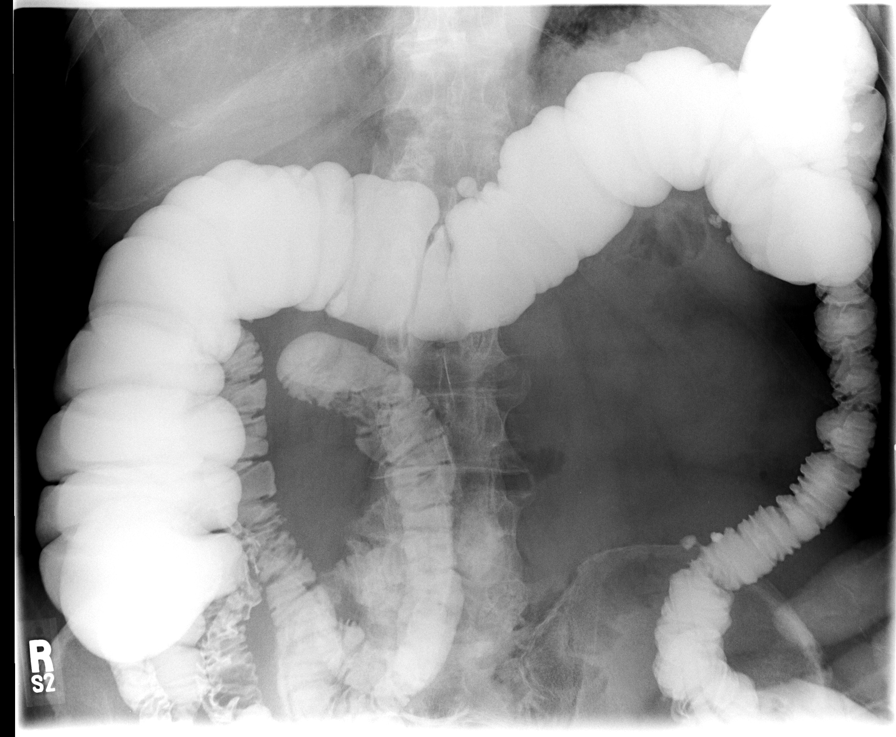

[Series 1005: view not recorded · 0.20mm/px · 1 of 1 slices shown (3 of 4)]
[im 1/1]
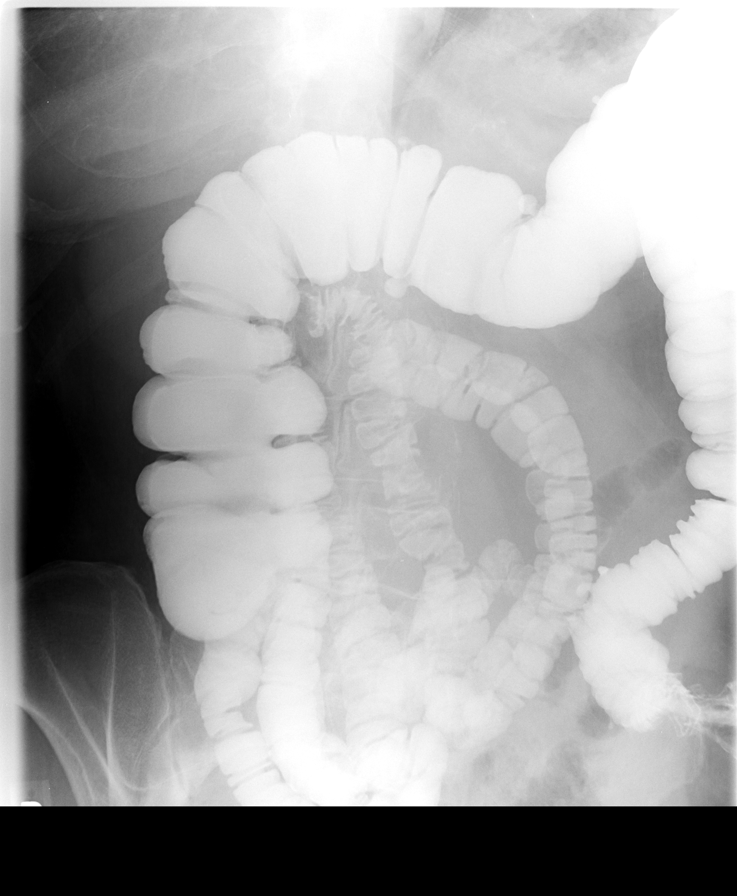

[Series 1007: view not recorded · 0.20mm/px · 1 of 1 slices shown (4 of 4)]
[im 1/1]
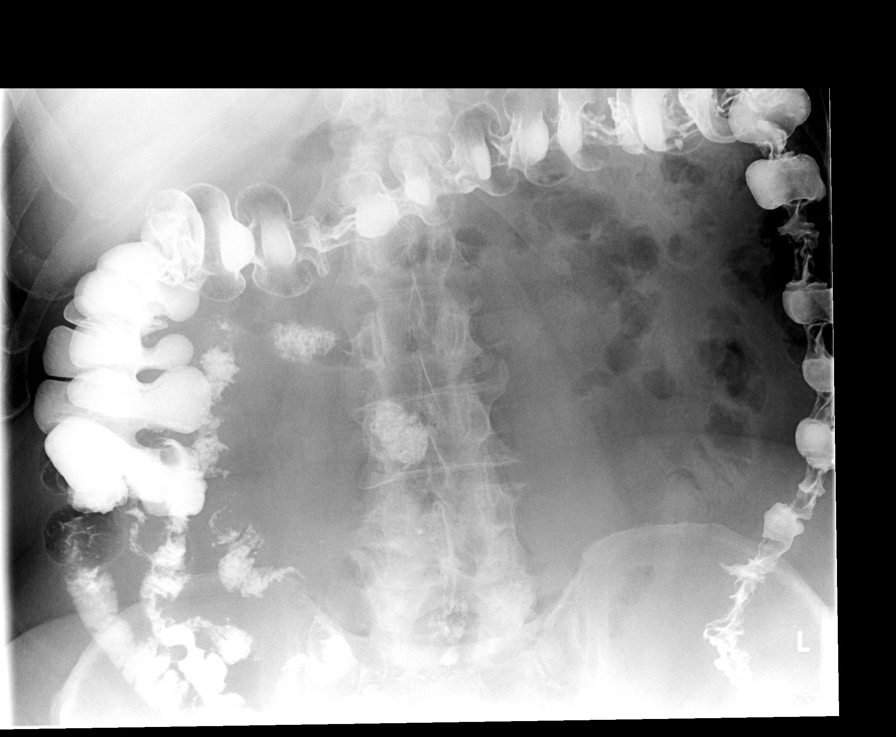

[14 of 24 positions shown; findings below may reference images not displayed]

FINDINGS: The rectosigmoid Hartmann's pouch measures approximately
25 cm from anal verge with no intrinsic or extrinsic lesions
visualized.

Left lower quadrant retrograde enema via colostomy demonstrates
slight transverse and descending colonic diverticulosis with no
other significant intrinsic or extrinsic colonic lesion.
Retrograde filling of terminal ileum appears normal.
IMPRESSION: 1.  Normal approximate 25 cm Hartmann's pouch/rectosigmoid colon.
2.  Slight transverse and descending colonic diverticulosis without
inflammatory change.
3.  Otherwise no significant abnormality.

## 2009-02-12 ENCOUNTER — Encounter: Payer: Self-pay | Admitting: Gastroenterology

## 2009-02-16 LAB — CONVERTED CEMR LAB
Cholesterol: 166 mg/dL
HDL: 72 mg/dL
LDL Cholesterol: 62 mg/dL
Triglycerides: 164 mg/dL

## 2009-02-26 ENCOUNTER — Ambulatory Visit: Payer: Self-pay | Admitting: Family Medicine

## 2009-02-26 LAB — CONVERTED CEMR LAB
INR: 2.3
Prothrombin Time: 18.4 s

## 2009-03-02 LAB — CONVERTED CEMR LAB
ALT: 21 units/L (ref 0–53)
AST: 28 units/L (ref 0–37)
Albumin: 4 g/dL (ref 3.5–5.2)
Alkaline Phosphatase: 40 units/L (ref 39–117)
BUN: 10 mg/dL (ref 6–23)
Basophils Absolute: 0.1 10*3/uL (ref 0.0–0.1)
Basophils Relative: 1.2 % (ref 0.0–3.0)
Bilirubin, Direct: 0.1 mg/dL (ref 0.0–0.3)
CO2: 27 meq/L (ref 19–32)
Calcium: 9.2 mg/dL (ref 8.4–10.5)
Chloride: 103 meq/L (ref 96–112)
Creatinine, Ser: 0.9 mg/dL (ref 0.4–1.5)
Eosinophils Absolute: 0.1 10*3/uL (ref 0.0–0.7)
Eosinophils Relative: 1.6 % (ref 0.0–5.0)
GFR calc non Af Amer: 88.88 mL/min (ref >60)
Glucose, Bld: 105 mg/dL — ABNORMAL HIGH (ref 70–99)
HCT: 40.8 % (ref 39.0–52.0)
Hemoglobin: 13.1 g/dL (ref 13.0–17.0)
Lymphocytes Relative: 36.8 % (ref 12.0–46.0)
Lymphs Abs: 1.8 10*3/uL (ref 0.7–4.0)
MCHC: 32.2 g/dL (ref 30.0–36.0)
MCV: 85.3 fL (ref 78.0–100.0)
Monocytes Absolute: 0.6 10*3/uL (ref 0.1–1.0)
Monocytes Relative: 11.9 % (ref 3.0–12.0)
Neutro Abs: 2.3 10*3/uL (ref 1.4–7.7)
Neutrophils Relative %: 48.5 % (ref 43.0–77.0)
PSA: 1.86 ng/mL (ref 0.10–4.00)
Platelets: 177 10*3/uL (ref 150.0–400.0)
Potassium: 4.1 meq/L (ref 3.5–5.1)
RBC: 4.78 M/uL (ref 4.22–5.81)
RDW: 15.2 % — ABNORMAL HIGH (ref 11.5–14.6)
Sodium: 137 meq/L (ref 135–145)
TSH: 2.23 microintl units/mL (ref 0.35–5.50)
Total Bilirubin: 0.7 mg/dL (ref 0.3–1.2)
Total Protein: 7 g/dL (ref 6.0–8.3)
WBC: 4.9 10*3/uL (ref 4.5–10.5)

## 2009-04-15 ENCOUNTER — Ambulatory Visit
Admission: RE | Admit: 2009-04-15 | Discharge: 2009-04-15 | Payer: Self-pay | Source: Home / Self Care | Admitting: General Surgery

## 2009-05-11 ENCOUNTER — Inpatient Hospital Stay (HOSPITAL_COMMUNITY): Admission: RE | Admit: 2009-05-11 | Discharge: 2009-05-17 | Payer: Self-pay | Admitting: General Surgery

## 2009-05-11 ENCOUNTER — Encounter (INDEPENDENT_AMBULATORY_CARE_PROVIDER_SITE_OTHER): Payer: Self-pay | Admitting: General Surgery

## 2009-05-21 ENCOUNTER — Encounter: Admission: RE | Admit: 2009-05-21 | Discharge: 2009-05-21 | Payer: Self-pay | Admitting: General Surgery

## 2009-05-21 IMAGING — CT CT ABD-PELV W/ CM
2 of 5 series · 16 of 46 positions shown, 18 images · IV contrast (30CC OMNI 300 & [ID] OMNI 300)
Comparison: The CT [DATE]

CLINICAL DATA: Post colostomy closure 10 days ago.  Left abdominal
pain.  Uncontrolled diarrhea, weight loss.

CT ABDOMEN AND PELVIS WITH CONTRAST
TECHNIQUE: Multidetector CT imaging of the abdomen and pelvis was
performed following the standard protocol during bolus
administration of intravenous contrast.
Contrast: 100 ml Omnipaque 300 IV.

[Series 2: a&pw/ · axial · 0.76mm/px · z∈[-390,+20]mm · 13 of 94 slices shown, 15 images]
[im 6/94  soft-tissue]
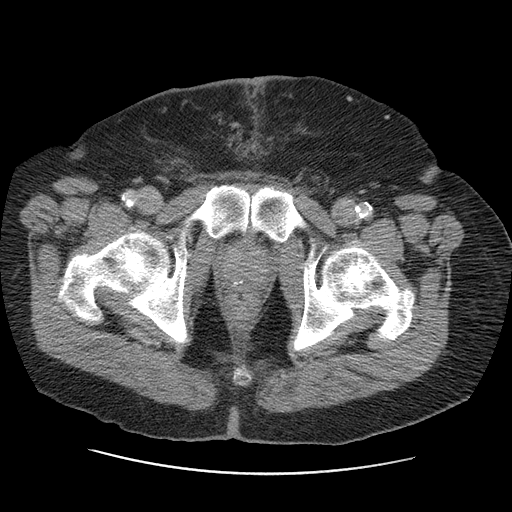
[im 6/94  bone]
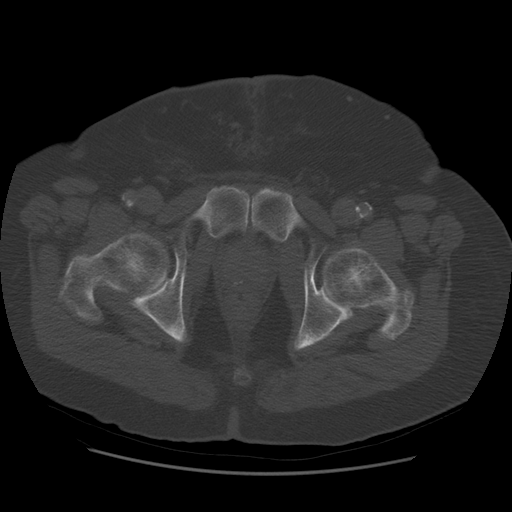
[im 11/94  soft-tissue]
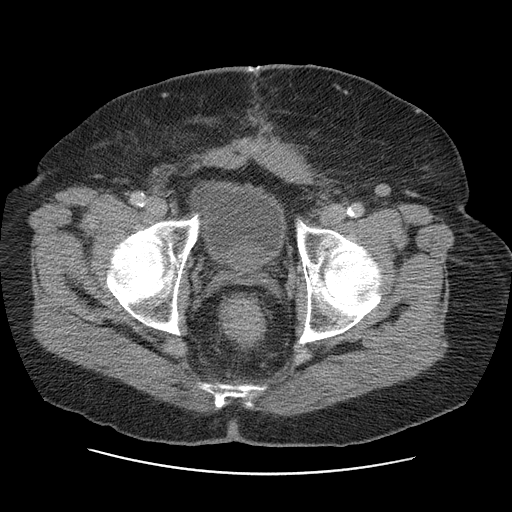
[im 22/94  soft-tissue]
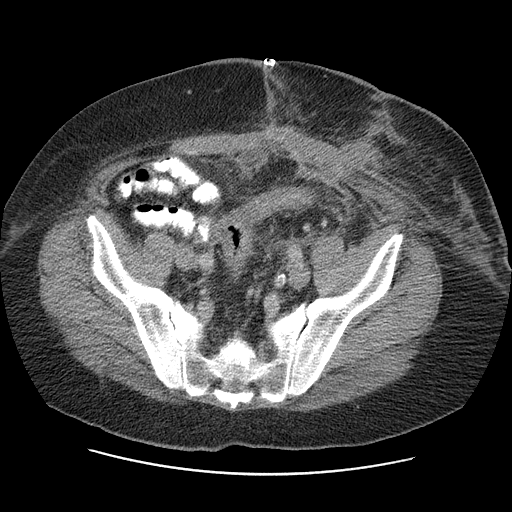
[im 28/94  soft-tissue]
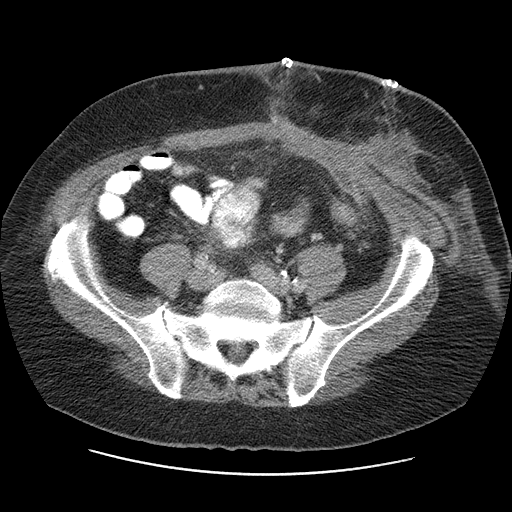
[im 33/94  soft-tissue]
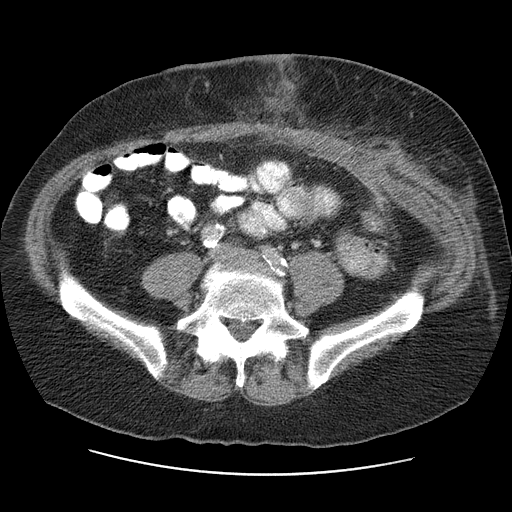
[im 39/94  soft-tissue]
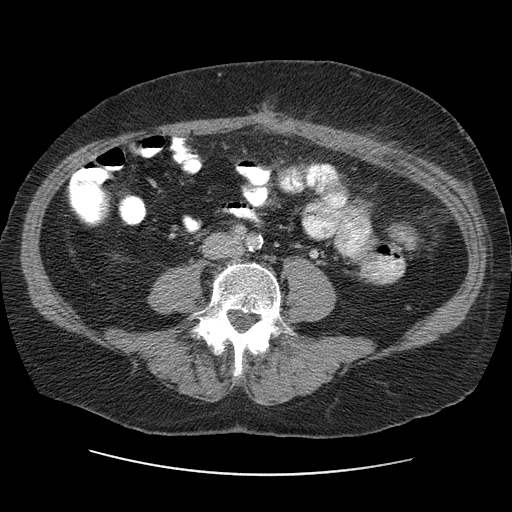
[im 50/94  soft-tissue]
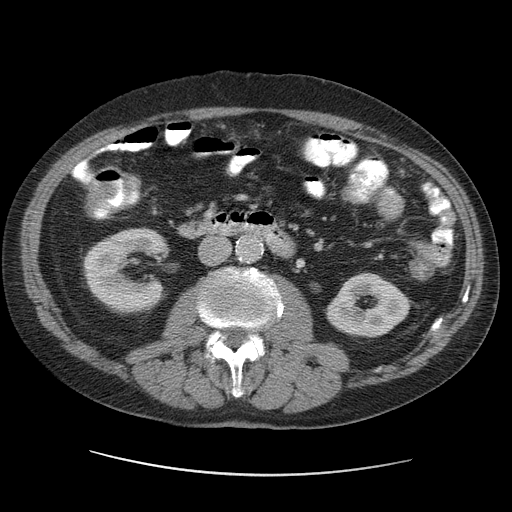
[im 55/94  soft-tissue]
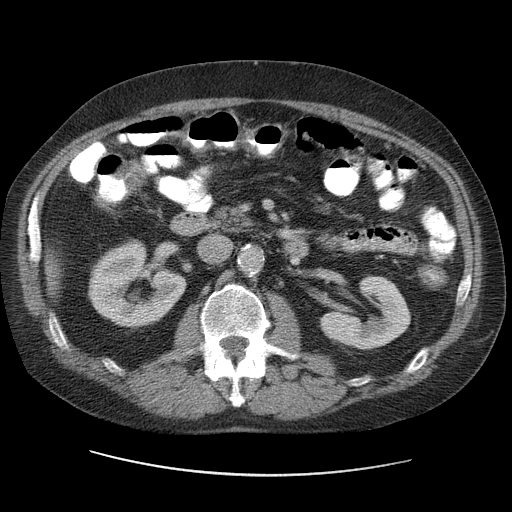
[im 61/94  soft-tissue]
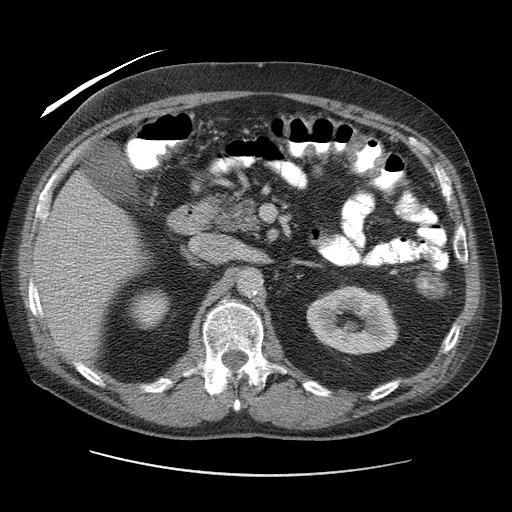
[im 61/94  bone]
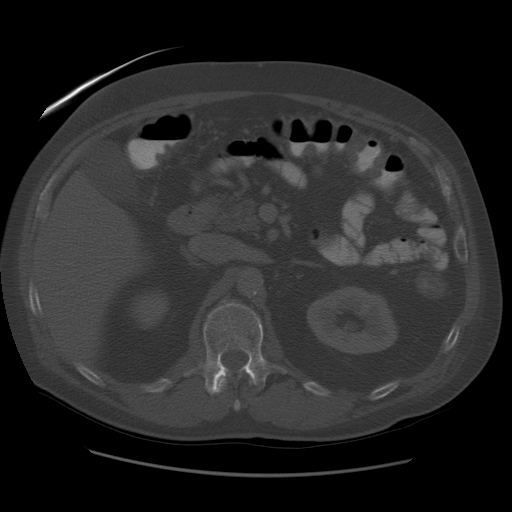
[im 66/94  soft-tissue]
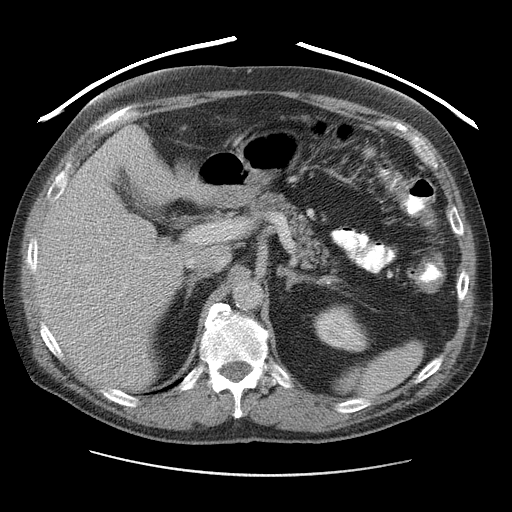
[im 72/94  soft-tissue]
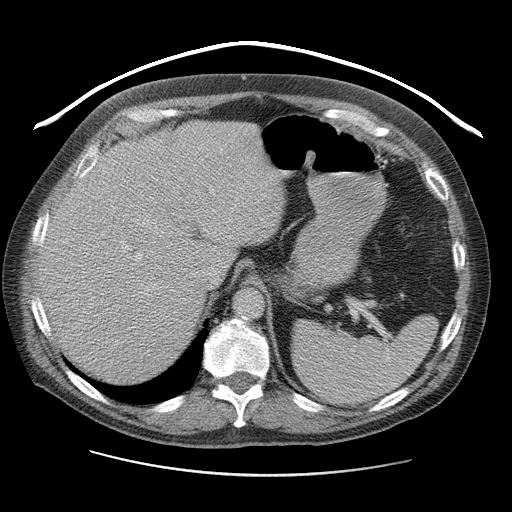
[im 83/94  soft-tissue]
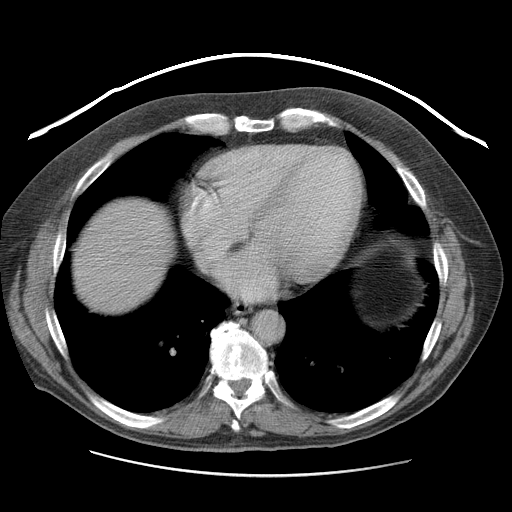
[im 88/94  soft-tissue]
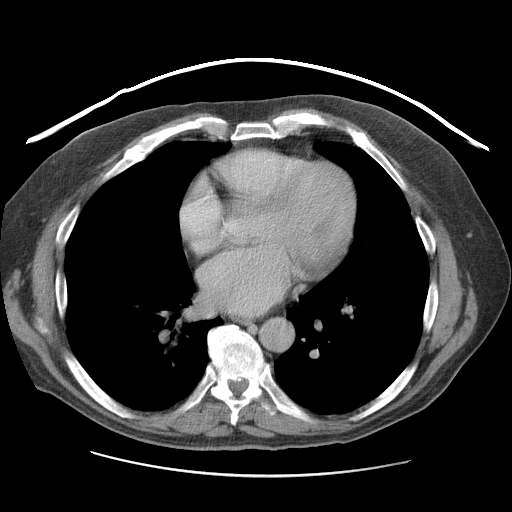

[Series 400: cor abd · coronal · 1.03mm/px · 3 of 142 slices shown]
[im 48/142  soft-tissue]
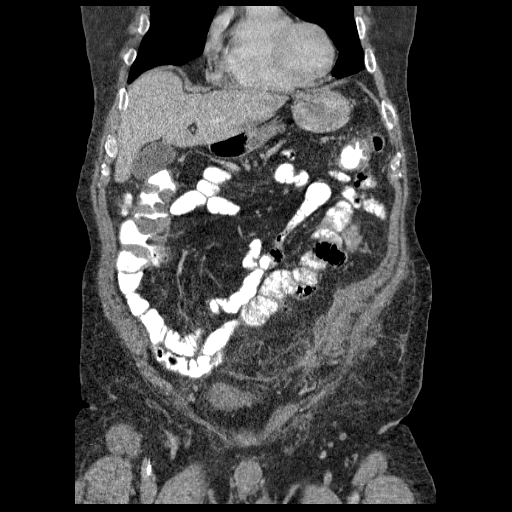
[im 63/142  soft-tissue]
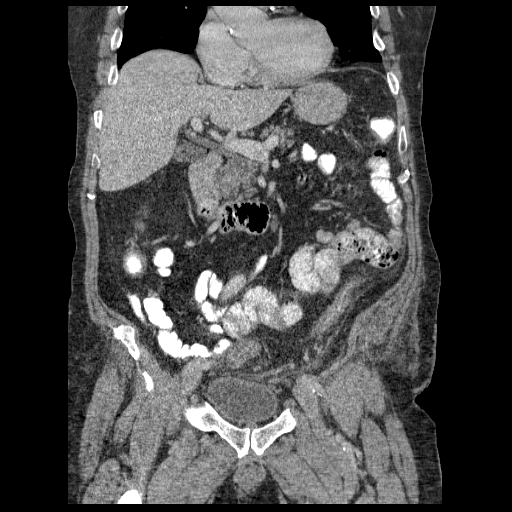
[im 79/142  soft-tissue]
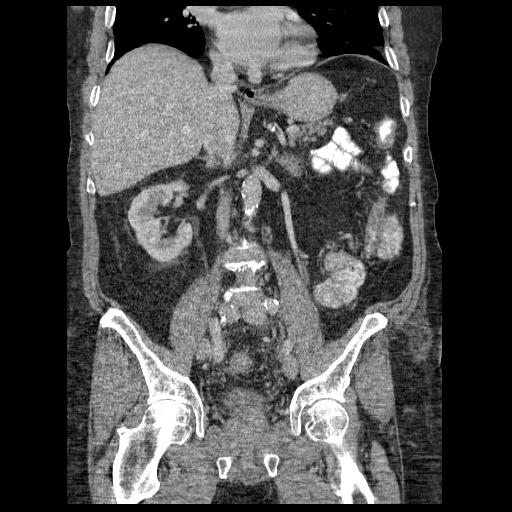

[16 of 46 positions shown; findings below may reference images not displayed]

FINDINGS: At initial imaging, contrast was in the descending colon.
Therefore, delayed images were also obtained 1 hour following
initial imaging to ensure passage of contrast into the rectum.

Coronary artery calcifications are present.  Heart is upper limits
normal in size.  No confluent opacities in the lung bases.  No
effusions.

Abdomen:  Liver, gallbladder, spleen, pancreas, adrenals and
kidneys are unremarkable.

Aorta is normal caliber. Abdominal large and small bowel
unremarkable.

Pelvis:  Extensive subcutaneous stranding is noted in the anterior
subcutaneous soft tissues of the left pelvic wall.  Irregular,
poorly defined fluid collections seen in the left lower quadrant
and in the region of the left rectus muscle.  The best defined
fluid collection is tiny, deep to the left abdominal wall
musculature, measures 2.3 x 1.3 cm on image 68.  There is stranding
noted around the sigmoid colon and in the pelvis superior to the
bladder.  Much of this may be postoperative, but infection cannot
be excluded.  On delayed images, contrast is seen into the rectum.
There is no evidence of contrast extravasation to suggest leak.
IMPRESSION: Extensive stranding in the anterior pelvic wall and adjacent to the
left abdominal musculature.  Ill-defined small fluid collections
superficial to and deep to the left abdominal wall musculature.
While some of this may represent postoperative changes, infection
cannot be completely excluded. No drainable fluid collection at
this time.  No evidence of leak or fistula at this time.

## 2009-05-21 IMAGING — CT CT ABD-PELV W/ CM
2 of 3 series · 17 of 46 positions shown, 19 images · IV contrast (30CC OMNI 300 & [ID] OMNI 350)
Comparison: The CT [DATE]

CLINICAL DATA: Post colostomy closure 10 days ago.  Left abdominal
pain.  Uncontrolled diarrhea, weight loss.

CT ABDOMEN AND PELVIS WITH CONTRAST
TECHNIQUE: Multidetector CT imaging of the abdomen and pelvis was
performed following the standard protocol during bolus
administration of intravenous contrast.
Contrast: 100 ml Omnipaque 300 IV.

[Series 2: ihr delay · axial · delayed · 0.76mm/px · z∈[-234,+32]mm · 14 of 61 slices shown, 16 images]
[im 4/61  soft-tissue]
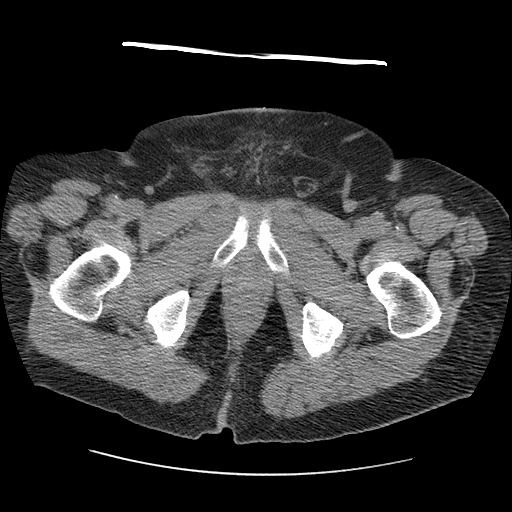
[im 4/61  bone]
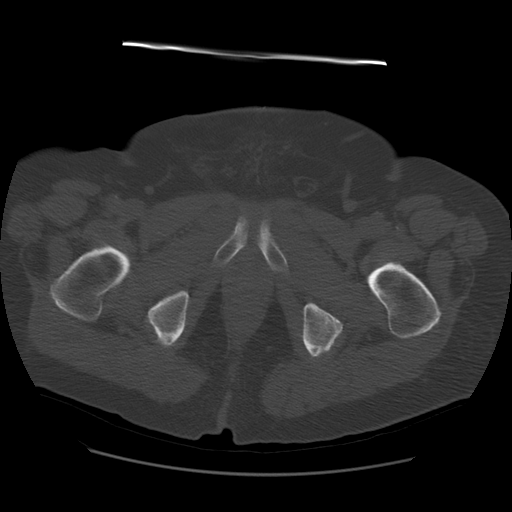
[im 8/61  soft-tissue]
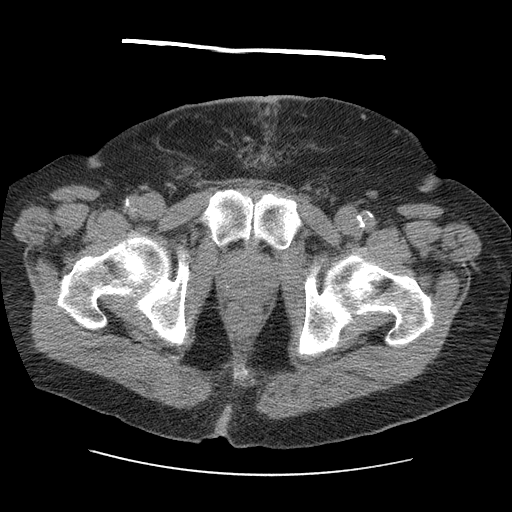
[im 12/61  soft-tissue]
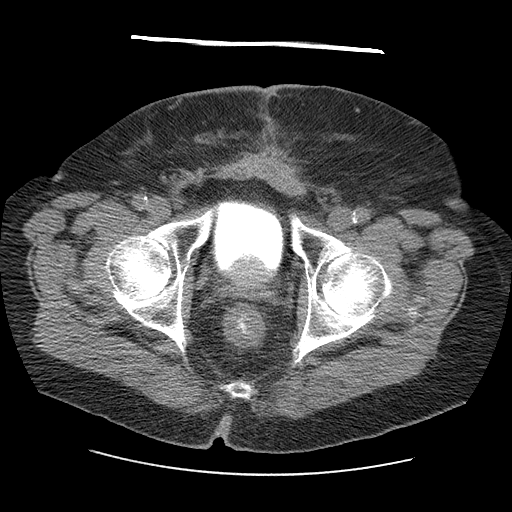
[im 16/61  soft-tissue]
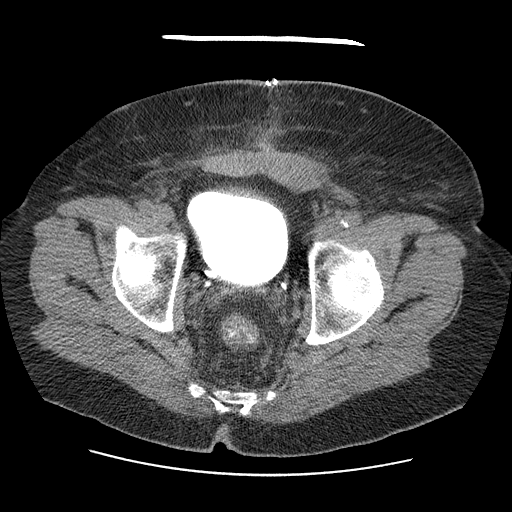
[im 20/61  soft-tissue]
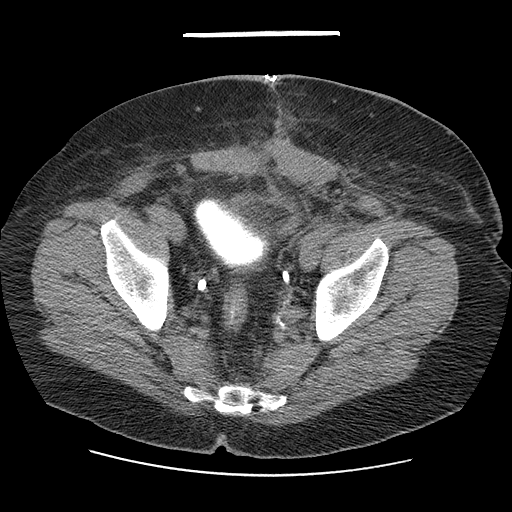
[im 24/61  soft-tissue]
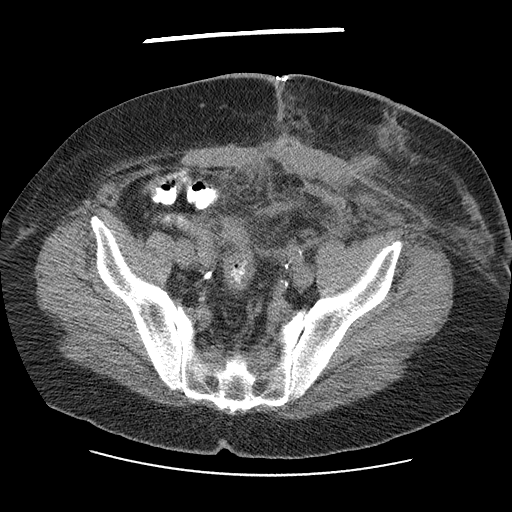
[im 28/61  soft-tissue]
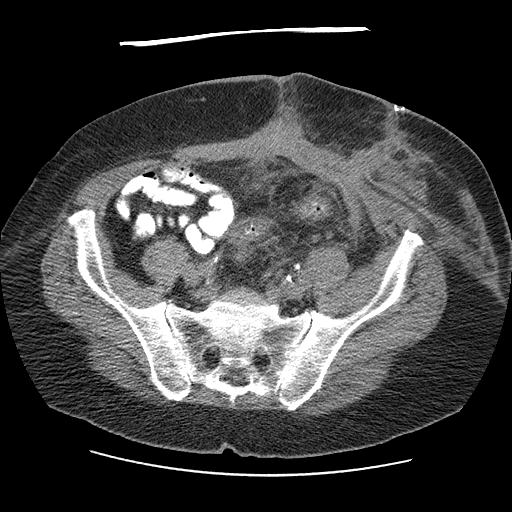
[im 33/61  soft-tissue]
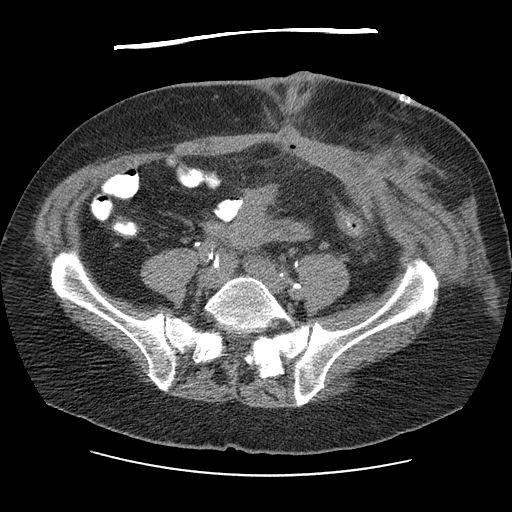
[im 37/61  soft-tissue]
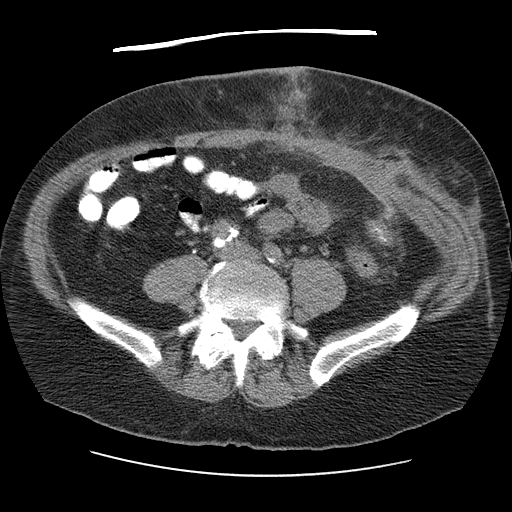
[im 37/61  bone]
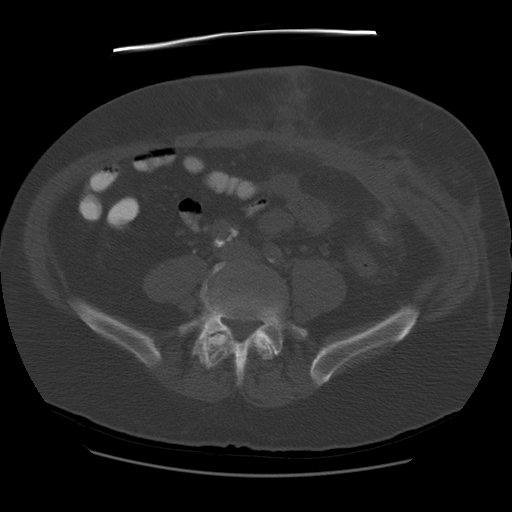
[im 41/61  soft-tissue]
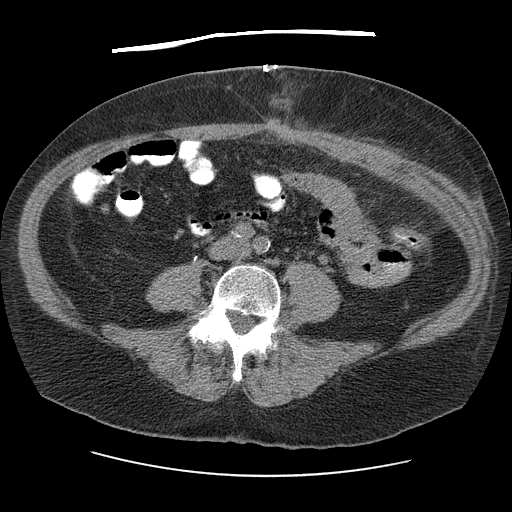
[im 45/61  soft-tissue]
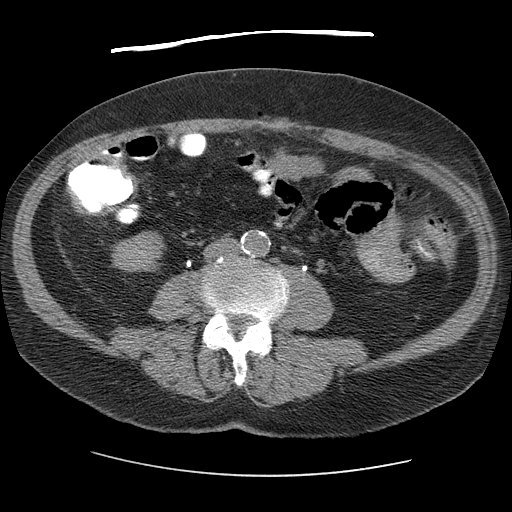
[im 49/61  soft-tissue]
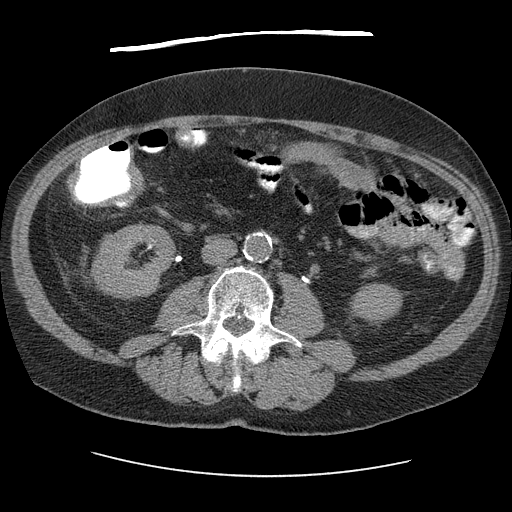
[im 53/61  soft-tissue]
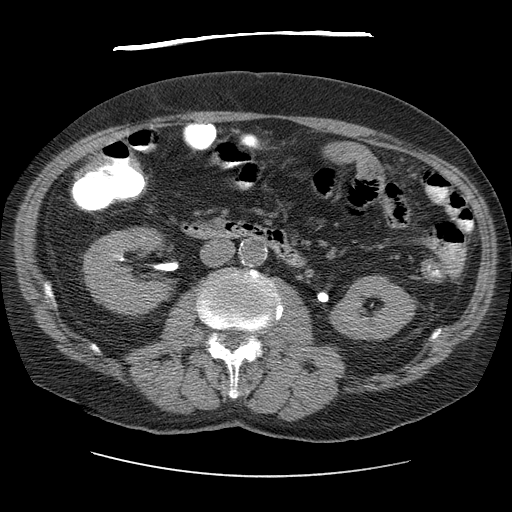
[im 57/61  soft-tissue]
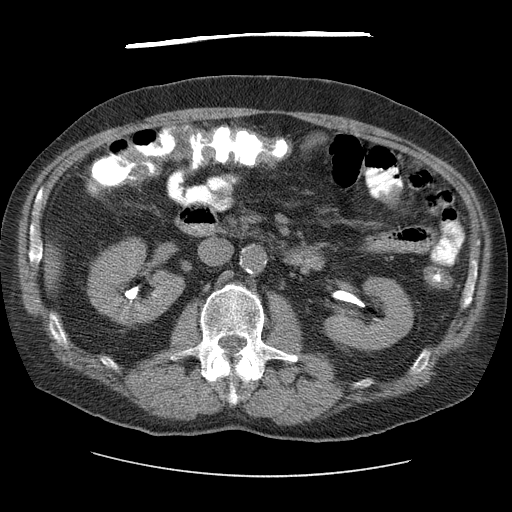

[Series 103: 1hr. delayed pelvis cor · coronal · 0.82mm/px · 3 of 140 slices shown]
[im 47/140  soft-tissue]
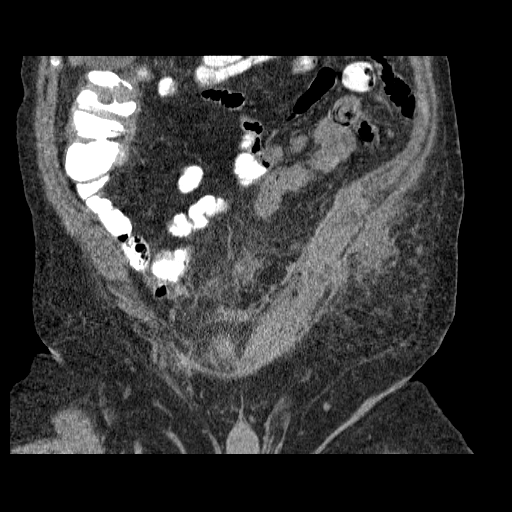
[im 62/140  soft-tissue]
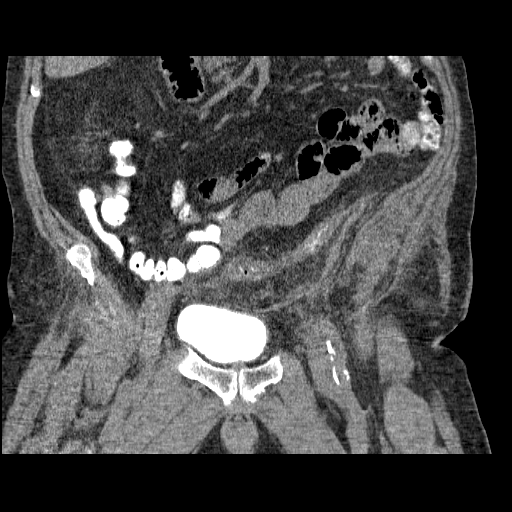
[im 78/140  soft-tissue]
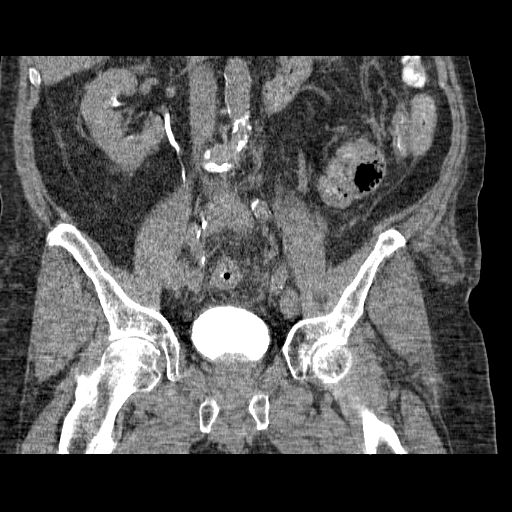

[17 of 46 positions shown; findings below may reference images not displayed]

FINDINGS: At initial imaging, contrast was in the descending colon.
Therefore, delayed images were also obtained 1 hour following
initial imaging to ensure passage of contrast into the rectum.

Coronary artery calcifications are present.  Heart is upper limits
normal in size.  No confluent opacities in the lung bases.  No
effusions.

Abdomen:  Liver, gallbladder, spleen, pancreas, adrenals and
kidneys are unremarkable.

Aorta is normal caliber. Abdominal large and small bowel
unremarkable.

Pelvis:  Extensive subcutaneous stranding is noted in the anterior
subcutaneous soft tissues of the left pelvic wall.  Irregular,
poorly defined fluid collections seen in the left lower quadrant
and in the region of the left rectus muscle.  The best defined
fluid collection is tiny, deep to the left abdominal wall
musculature, measures 2.3 x 1.3 cm on image 68.  There is stranding
noted around the sigmoid colon and in the pelvis superior to the
bladder.  Much of this may be postoperative, but infection cannot
be excluded.  On delayed images, contrast is seen into the rectum.
There is no evidence of contrast extravasation to suggest leak.
IMPRESSION: Extensive stranding in the anterior pelvic wall and adjacent to the
left abdominal musculature.  Ill-defined small fluid collections
superficial to and deep to the left abdominal wall musculature.
While some of this may represent postoperative changes, infection
cannot be completely excluded. No drainable fluid collection at
this time.  No evidence of leak or fistula at this time.

## 2009-05-28 ENCOUNTER — Encounter: Payer: Self-pay | Admitting: Gastroenterology

## 2009-06-26 ENCOUNTER — Ambulatory Visit: Payer: Self-pay | Admitting: Family Medicine

## 2009-07-09 ENCOUNTER — Encounter: Payer: Self-pay | Admitting: Gastroenterology

## 2009-07-14 ENCOUNTER — Ambulatory Visit: Payer: Self-pay | Admitting: Family Medicine

## 2009-07-14 LAB — CONVERTED CEMR LAB: INR: 3.3

## 2009-07-28 ENCOUNTER — Ambulatory Visit: Payer: Self-pay | Admitting: Family Medicine

## 2009-07-28 LAB — CONVERTED CEMR LAB: INR: 3.6

## 2009-08-18 ENCOUNTER — Ambulatory Visit: Payer: Self-pay | Admitting: Family Medicine

## 2009-08-18 LAB — CONVERTED CEMR LAB: INR: 2.9

## 2009-12-02 ENCOUNTER — Telehealth: Payer: Self-pay | Admitting: Family Medicine

## 2009-12-24 ENCOUNTER — Ambulatory Visit: Payer: Self-pay | Admitting: Family Medicine

## 2009-12-24 LAB — CONVERTED CEMR LAB: INR: 3.9

## 2010-02-26 ENCOUNTER — Encounter: Payer: Self-pay | Admitting: Family Medicine

## 2010-02-26 ENCOUNTER — Other Ambulatory Visit: Payer: Self-pay | Admitting: Family Medicine

## 2010-02-26 ENCOUNTER — Ambulatory Visit
Admission: RE | Admit: 2010-02-26 | Discharge: 2010-02-26 | Payer: Self-pay | Source: Home / Self Care | Attending: Family Medicine | Admitting: Family Medicine

## 2010-02-26 LAB — BASIC METABOLIC PANEL
BUN: 13 mg/dL (ref 6–23)
CO2: 27 mEq/L (ref 19–32)
Calcium: 9.4 mg/dL (ref 8.4–10.5)
Chloride: 106 mEq/L (ref 96–112)
Creatinine, Ser: 0.7 mg/dL (ref 0.4–1.5)
GFR: 112.84 mL/min (ref >60.00)
Glucose, Bld: 90 mg/dL (ref 70–99)
Potassium: 4.5 mEq/L (ref 3.5–5.1)
Sodium: 140 mEq/L (ref 135–145)

## 2010-02-26 LAB — CBC WITH DIFFERENTIAL/PLATELET
Basophils Absolute: 0.1 10*3/uL (ref 0.0–0.1)
Basophils Relative: 1.2 % (ref 0.0–3.0)
Eosinophils Absolute: 0 10*3/uL (ref 0.0–0.7)
Eosinophils Relative: 0.8 % (ref 0.0–5.0)
HCT: 40.6 % (ref 39.0–52.0)
Hemoglobin: 13.4 g/dL (ref 13.0–17.0)
Lymphocytes Relative: 40.2 % (ref 12.0–46.0)
Lymphs Abs: 2 10*3/uL (ref 0.7–4.0)
MCHC: 32.9 g/dL (ref 30.0–36.0)
MCV: 79.4 fl (ref 78.0–100.0)
Monocytes Absolute: 0.5 10*3/uL (ref 0.1–1.0)
Monocytes Relative: 10.7 % (ref 3.0–12.0)
Neutro Abs: 2.3 10*3/uL (ref 1.4–7.7)
Neutrophils Relative %: 47.1 % (ref 43.0–77.0)
Platelets: 221 10*3/uL (ref 150.0–400.0)
RBC: 5.11 Mil/uL (ref 4.22–5.81)
RDW: 18.1 % — ABNORMAL HIGH (ref 11.5–14.6)
WBC: 4.9 10*3/uL (ref 4.5–10.5)

## 2010-02-26 LAB — CONVERTED CEMR LAB: INR: 1.9

## 2010-02-26 LAB — LIPID PANEL
Cholesterol: 254 mg/dL — ABNORMAL HIGH (ref 0–200)
HDL: 44.5 mg/dL (ref >39.00)
Total CHOL/HDL Ratio: 6
Triglycerides: 160 mg/dL — ABNORMAL HIGH (ref 0.0–149.0)
VLDL: 32 mg/dL (ref 0.0–40.0)

## 2010-02-26 LAB — HEPATIC FUNCTION PANEL
ALT: 19 U/L (ref 0–53)
AST: 28 U/L (ref 0–37)
Albumin: 3.9 g/dL (ref 3.5–5.2)
Alkaline Phosphatase: 47 U/L (ref 39–117)
Bilirubin, Direct: 0.1 mg/dL (ref 0.0–0.3)
Total Bilirubin: 0.5 mg/dL (ref 0.3–1.2)
Total Protein: 6.6 g/dL (ref 6.0–8.3)

## 2010-02-26 LAB — PSA: PSA: 1.64 ng/mL (ref 0.10–4.00)

## 2010-02-26 LAB — TSH: TSH: 1.96 u[IU]/mL (ref 0.35–5.50)

## 2010-02-26 LAB — LDL CHOLESTEROL, DIRECT: Direct LDL: 184.2 mg/dL

## 2010-03-03 ENCOUNTER — Telehealth: Payer: Self-pay | Admitting: Family Medicine

## 2010-03-09 ENCOUNTER — Encounter: Payer: Self-pay | Admitting: Family Medicine

## 2010-03-09 ENCOUNTER — Ambulatory Visit
Admission: RE | Admit: 2010-03-09 | Discharge: 2010-03-09 | Payer: Self-pay | Source: Home / Self Care | Attending: Family Medicine | Admitting: Family Medicine

## 2010-03-11 ENCOUNTER — Telehealth: Payer: Self-pay | Admitting: Family Medicine

## 2010-03-14 LAB — CONVERTED CEMR LAB
ALT: 23 units/L (ref 0–53)
ALT: 24 units/L (ref 0–53)
AST: 30 units/L (ref 0–37)
AST: 32 units/L (ref 0–37)
Albumin: 4 g/dL (ref 3.5–5.2)
Albumin: 4 g/dL (ref 3.5–5.2)
Alkaline Phosphatase: 38 units/L — ABNORMAL LOW (ref 39–117)
Alkaline Phosphatase: 42 units/L (ref 39–117)
BUN: 11 mg/dL (ref 6–23)
BUN: 14 mg/dL (ref 6–23)
Basophils Absolute: 0 10*3/uL (ref 0.0–0.1)
Basophils Absolute: 0.1 10*3/uL (ref 0.0–0.1)
Basophils Relative: 0 % (ref 0.0–1.0)
Basophils Relative: 1.5 % (ref 0.0–3.0)
Bilirubin Urine: NEGATIVE
Bilirubin, Direct: 0.1 mg/dL (ref 0.0–0.3)
Bilirubin, Direct: 0.2 mg/dL (ref 0.0–0.3)
CO2: 29 meq/L (ref 19–32)
CO2: 31 meq/L (ref 19–32)
Calcium: 9.7 mg/dL (ref 8.4–10.5)
Calcium: 9.9 mg/dL (ref 8.4–10.5)
Chloride: 106 meq/L (ref 96–112)
Chloride: 106 meq/L (ref 96–112)
Cholesterol: 153 mg/dL (ref 0–200)
Cholesterol: 164 mg/dL (ref 0–200)
Creatinine, Ser: 0.8 mg/dL (ref 0.4–1.5)
Creatinine, Ser: 0.8 mg/dL (ref 0.4–1.5)
Eosinophils Absolute: 0.1 10*3/uL (ref 0.0–0.6)
Eosinophils Absolute: 0.1 10*3/uL (ref 0.0–0.7)
Eosinophils Relative: 1.2 % (ref 0.0–5.0)
Eosinophils Relative: 1.9 % (ref 0.0–5.0)
GFR calc Af Amer: 124 mL/min
GFR calc Af Amer: 124 mL/min
GFR calc non Af Amer: 102 mL/min
GFR calc non Af Amer: 102 mL/min
Glucose, Bld: 107 mg/dL — ABNORMAL HIGH (ref 70–99)
Glucose, Bld: 99 mg/dL (ref 70–99)
Glucose, Urine, Semiquant: NEGATIVE
HCT: 44.7 % (ref 39.0–52.0)
HCT: 44.9 % (ref 39.0–52.0)
HDL: 51.7 mg/dL (ref >39.0)
HDL: 56.6 mg/dL (ref >39.0)
Hemoglobin: 15.4 g/dL (ref 13.0–17.0)
Hemoglobin: 15.4 g/dL (ref 13.0–17.0)
INR: 2.7
INR: 3.3
Ketones, urine, test strip: NEGATIVE
LDL Cholesterol: 81 mg/dL (ref 0–99)
LDL Cholesterol: 83 mg/dL (ref 0–99)
Lymphocytes Relative: 27.7 % (ref 12.0–46.0)
Lymphocytes Relative: 41.3 % (ref 12.0–46.0)
MCHC: 34.2 g/dL (ref 30.0–36.0)
MCHC: 34.5 g/dL (ref 30.0–36.0)
MCV: 85.5 fL (ref 78.0–100.0)
MCV: 87.1 fL (ref 78.0–100.0)
Monocytes Absolute: 0.7 10*3/uL (ref 0.2–0.7)
Monocytes Absolute: 0.8 10*3/uL (ref 0.1–1.0)
Monocytes Relative: 11.6 % — ABNORMAL HIGH (ref 3.0–11.0)
Monocytes Relative: 12.5 % — ABNORMAL HIGH (ref 3.0–12.0)
Neutro Abs: 3 10*3/uL (ref 1.4–7.7)
Neutro Abs: 3.6 10*3/uL (ref 1.4–7.7)
Neutrophils Relative %: 45.9 % (ref 43.0–77.0)
Neutrophils Relative %: 56.4 % (ref 43.0–77.0)
Nitrite: NEGATIVE
PSA: 1.4 ng/mL (ref 0.10–4.00)
PSA: 1.66 ng/mL (ref 0.10–4.00)
Platelets: 179 10*3/uL (ref 150–400)
Platelets: 184 10*3/uL (ref 150–400)
Potassium: 4.1 meq/L (ref 3.5–5.1)
Potassium: 4.3 meq/L (ref 3.5–5.1)
Protein, U semiquant: NEGATIVE
Prothrombin Time: 19.9 s
Prothrombin Time: 22 s
RBC: 5.16 M/uL (ref 4.22–5.81)
RBC: 5.22 M/uL (ref 4.22–5.81)
RDW: 12.9 % (ref 11.5–14.6)
RDW: 13.3 % (ref 11.5–14.6)
Sodium: 141 meq/L (ref 135–145)
Sodium: 142 meq/L (ref 135–145)
Specific Gravity, Urine: 1.02
TSH: 1.84 microintl units/mL (ref 0.35–5.50)
TSH: 2.49 microintl units/mL (ref 0.35–5.50)
Total Bilirubin: 0.7 mg/dL (ref 0.3–1.2)
Total Bilirubin: 0.9 mg/dL (ref 0.3–1.2)
Total CHOL/HDL Ratio: 2.9
Total CHOL/HDL Ratio: 3
Total Protein: 6.6 g/dL (ref 6.0–8.3)
Total Protein: 6.7 g/dL (ref 6.0–8.3)
Triglycerides: 130 mg/dL (ref 0–149)
Triglycerides: 93 mg/dL (ref 0–149)
Urobilinogen, UA: 0.2
VLDL: 19 mg/dL (ref 0–40)
VLDL: 26 mg/dL (ref 0–40)
WBC Urine, dipstick: NEGATIVE
WBC: 6.3 10*3/uL (ref 4.5–10.5)
WBC: 6.4 10*3/uL (ref 4.5–10.5)
pH: 5.5

## 2010-03-15 ENCOUNTER — Telehealth: Payer: Self-pay | Admitting: Family Medicine

## 2010-03-18 NOTE — Letter (Signed)
Summary: [facility]   Imported By: Lester [place] 09/15/2008 09:28:37  _____________________________________________________________________  External Attachment:    Type:   Image     Comment:   External Document

## 2010-03-18 NOTE — Assessment & Plan Note (Signed)
Summary: 3 day rov/njr   Vital Signs:  Patient Profile:   70 Years Old Male Height:     71 inches Weight:      237 pounds Temp:     98.1 degrees F oral Pulse rate:   60 / minute Pulse rhythm:   irregular BP sitting:   142 / 84  (left arm) Cuff size:   regular  Vitals Entered By: Kern Reap CMA (February 29, 2008 12:32 PM)                 Chief Complaint:  follow up with left leg.  History of Present Illness: William Norman is a 70 year old male, who comes in today for follow-up of cellulitis of his left lower extremity.  He was seen earlier this week with cellulitis.  He was started on Keflex 1 g t.i.d.  He was placed at bedrest with elevation because of 2+ edema of the extremity.  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  Because of that.  He has chronic venous insufficiency.  He comes back today saying is better, but not well.  The swelling has not changed    Prior Medication List:  ATENOLOL 100 MG  TABS (ATENOLOL) Take 1 tablet by mouth once a day COUMADIN 5 MG  TABS (WARFARIN SODIUM) Take 1 tablet by mouth once a day NEXIUM 40 MG  CPDR (ESOMEPRAZOLE MAGNESIUM) Take 1 tab by mouth at bedtime NIASPAN 500 MG  TBCR (NIACIN (ANTIHYPERLIPIDEMIC)) Take 1 tablet by mouth once a day KLOR-CON M20 20 MEQ  TBCR (POTASSIUM CHLORIDE CRYS CR) Take 1 tablet by mouth once a day LISINOPRIL 20 MG  TABS (LISINOPRIL) Take 1 tablet by mouth once a day NITROSTAT 0.4 MG  SUBL (NITROGLYCERIN) as needed,uad VIAGRA 50 MG  TABS (SILDENAFIL CITRATE) as needed, uad * EQUATE NASAL SPRAY  CRESTOR 40 MG  TABS (ROSUVASTATIN CALCIUM) 1 tab @ bedtime KEFLEX 500 MG CAPS (CEPHALEXIN) Take 2 tablet by mouth two times a day   Current Allergies: No known allergies   Past Medical History:    Reviewed history from 08/04/2006 and no changes required:       CAD       Hyperlipidemia       Hypertension       BPH       GERD       ECZEMA       PERIPHERAL EDEMA   Social History:    Reviewed  history from 02/13/2007 and no changes required:       Retired       Married       Never Smoked    Review of Systems      See HPI   Physical Exam  General:     Well-developed,well-nourished,in no acute distress; alert,appropriate and cooperative throughout examination Skin:     the abrasion appears unchanged.  The erythema is less, but only slightly.  There is 2+ edema    Impression & Recommendations:  Problem # 1:  CELLULITIS AND ABSCESS OF OTHER SPECIFIED SITE [phone]) Assessment: Improved  His updated medication list for this problem includes:    Keflex 500 Mg Caps (Cephalexin) .Marland Kitchen... Take 2 tablet by mouth two times a day   Complete Medication List: 1)  Atenolol 100 Mg Tabs (Atenolol) .... Take 1 tablet by mouth once a day 2)  Coumadin 5 Mg Tabs (Warfarin sodium) .... Take 1 tablet by mouth once a day 3)  Nexium 40 Mg  Cpdr (Esomeprazole magnesium) .... Take 1 tab by mouth at bedtime 4)  Niaspan 500 Mg Tbcr (Niacin (antihyperlipidemic)) .... Take 1 tablet by mouth once a day 5)  Klor-con M20 20 Meq Tbcr (Potassium chloride crys cr) .... Take 1 tablet by mouth once a day 6)  Lisinopril 20 Mg Tabs (Lisinopril) .... Take 1 tablet by mouth once a day 7)  Nitrostat 0.4 Mg Subl (Nitroglycerin) .... As needed,uad 8)  Viagra 50 Mg Tabs (Sildenafil citrate) .... As needed, uad 9)  Equate Nasal Spray  10)  Crestor 40 Mg Tabs (Rosuvastatin calcium) .Marland Kitchen.. 1 tab @ bedtime 11)  Keflex 500 Mg Caps (Cephalexin) .... Take 2 tablet by mouth two times a day 12)  Furosemide 20 Mg Tabs (Furosemide) .... Take 1 tablet by mouth two times a day   Patient Instructions: 1)  continue your current treatment program.  Add Lasix 20 mg in the morning and 20 mg at noon.  Continue a salt free diet.  Elevation in your medication.  Return Monday afternoon for follow   Prescriptions: FUROSEMIDE 20 MG TABS (FUROSEMIDE) Take 1 tablet by mouth two times a day  #200 x 3   Entered and Authorized by:    Roderick Pee MD   Signed by:   Roderick Pee MD on 02/29/2008   Method used:   Print then Give to Patient   RxID:   [id number]

## 2010-03-18 NOTE — Progress Notes (Signed)
Summary: fyi  Phone Note Call from Patient   Summary of Call: patient is aware of lab results but he does not want to take his crestor until he sees his cardiologist because crestor causes muscle pain for him. Initial call taken by: Kern Reap CMA Duncan Dull),  March 03, 2010 3:32 PM  Follow-up for Phone Call        Provider Notified Follow-up by: Roderick Pee MD,  March 04, 2010 10:39 AM

## 2010-03-18 NOTE — Letter (Signed)
Summary: Dr Retta Mac request  Dr Retta Mac request   Imported By: Kassie Mends 05/10/2007 15:57:45  _____________________________________________________________________  External Attachment:    Type:   Image     Comment:   Dr Retta Mac request

## 2010-03-18 NOTE — Assessment & Plan Note (Signed)
Summary: ?dvt/njr   Vital Signs:  Patient Profile:   70 Years Old Male Height:     71 inches Weight:      237 pounds Temp:     98.2 degrees F oral BP sitting:   140 / 92  (left arm)  Vitals Entered By: Kern Reap CMA (February 27, 2008 11:59 AM)                 Chief Complaint:  left leg swollen and red.  History of Present Illness: [name] is a 70 year old male, who comes in today for evaluation of redness and swelling of his left lower extremity.  He bumped his shin.  A couple days ago, and then noticed some redness and swelling.  He's had no fever, chills.  He had a history of venous insufficiency, secondary to previous DVT.  He has chronic edema of his lower extremities, but is not taking his Lasix as directed    Prior Medication List:  ATENOLOL 100 MG  TABS (ATENOLOL) Take 1 tablet by mouth once a day COUMADIN 5 MG  TABS (WARFARIN SODIUM) Take 1 tablet by mouth once a day NEXIUM 40 MG  CPDR (ESOMEPRAZOLE MAGNESIUM) Take 1 tab by mouth at bedtime NIASPAN 500 MG  TBCR (NIACIN (ANTIHYPERLIPIDEMIC)) Take 1 tablet by mouth once a day KLOR-CON M20 20 MEQ  TBCR (POTASSIUM CHLORIDE CRYS CR) Take 1 tablet by mouth once a day LISINOPRIL 20 MG  TABS (LISINOPRIL) Take 1 tablet by mouth once a day NITROSTAT 0.4 MG  SUBL (NITROGLYCERIN) as needed,uad VIAGRA 50 MG  TABS (SILDENAFIL CITRATE) as needed, uad * EQUATE NASAL SPRAY  CRESTOR 40 MG  TABS (ROSUVASTATIN CALCIUM) 1 tab @ bedtime KEFLEX 500 MG CAPS (CEPHALEXIN) Take 2 tablet by mouth two times a day   Current Allergies: No known allergies   Past Medical History:    Reviewed history from 08/04/2006 and no changes required:       CAD       Hyperlipidemia       Hypertension       BPH       GERD       ECZEMA       PERIPHERAL EDEMA   Social History:    Reviewed history from 02/13/2007 and no changes required:       Retired       Married       Never Smoked    Review of Systems      See HPI   Physical  Exam  General:     Well-developed,well-nourished,in no acute distress; alert,appropriate and cooperative throughout examination Skin:     abrasion over the tibia in the midline with superficial erythema.    Impression & Recommendations:  Problem # 1:  DEEP VENOUS THROMBOPHLEBITIS (ICD-453.40)  Complete Medication List: 1)  Atenolol 100 Mg Tabs (Atenolol) .... Take 1 tablet by mouth once a day 2)  Coumadin 5 Mg Tabs (Warfarin sodium) .... Take 1 tablet by mouth once a day 3)  Nexium 40 Mg Cpdr (Esomeprazole magnesium) .... Take 1 tab by mouth at bedtime 4)  Niaspan 500 Mg Tbcr (Niacin (antihyperlipidemic)) .... Take 1 tablet by mouth once a day 5)  Klor-con M20 20 Meq Tbcr (Potassium chloride crys cr) .... Take 1 tablet by mouth once a day 6)  Lisinopril 20 Mg Tabs (Lisinopril) .... Take 1 tablet by mouth once a day 7)  Nitrostat 0.4 Mg Subl (Nitroglycerin) .... As needed,uad 8)  Viagra 50  Mg Tabs (Sildenafil citrate) .... As needed, uad 9)  Equate Nasal Spray  10)  Crestor 40 Mg Tabs (Rosuvastatin calcium) .Marland Kitchen.. 1 tab @ bedtime 11)  Keflex 500 Mg Caps (Cephalexin) .... Take 2 tablet by mouth two times a day   Patient Instructions: 1)  elevation or leg state, complete bed rest, no salt.  Begin Keflex 1 g t.i.d. return two days for follow-up

## 2010-03-18 NOTE — Procedures (Signed)
Summary: Colonoscopy  Patient: William Norman Note: All result statuses are Final unless otherwise noted.  Tests: (1) Colonoscopy (COL)   COL Colonoscopy           DONE (C)     [facility]     [address]     [place], [place]  [address]           COLONOSCOPY PROCEDURE REPORT           PATIENT:  William Norman, William Norman  MR#:  #[id number]     BIRTHDATE:  December 16, 1940, 68 yrs. old  GENDER:  male           ENDOSCOPIST:  Judie Petit T. Russella Dar, MD, [facility]           PROCEDURE DATE:  12/25/2008     PROCEDURE:  Colonoscopy with biopsy     ASA CLASS:  Class III     INDICATIONS:  1) Routine Risk Screening S/P sigmoid colectomy,     colostomy.           MEDICATIONS:   Fentanyl 50 mcg IV, Versed 8 mg IV           DESCRIPTION OF PROCEDURE:   After the risks benefits and     alternatives of the procedure were thoroughly explained, informed     consent was obtained.  Digital rectal exam was performed and     revealed no abnormalities.   The LB [id number] endoscope     was introduced through the colostomy and and advanced to the     cecum, which was identified by both the appendix and ileocecal     valve, without limitations.  The endoscope was  then inserted in     the rectum and advanced to the end of the Hartmann's pouch with     return material in the rectum. The quality of the prep was     excellent, using MoviPrep.  The instrument was then slowly     withdrawn as the colon was fully examined.     <<PROCEDUREIMAGES>>           FINDINGS:  Mild diverticulosis was found in the descending colon.     A normal appearing cecum, ileocecal valve, and appendiceal orifice     were identified. The ascending, hepatic flexure, transverse,     splenic flexure, appeared unremarkable.  There was evidence of a     prior segmental colectomy. in the sigmoid colon and a colostomy.     Proctitis was identified in the rectum, suspect diversion or statis     related colitis. It was erythematous and friable.  Multiple     biopsies were obtained and sent to pathology.  Retroflexed views     in the rectum revealed internal hemorrhoids, small. The Hartman's     pouch was otherwise normal. The time to cecum =  1.25  minutes.     The scope was then withdrawn (time =  9.67  min) from the patient     and the procedure completed.           COMPLICATIONS:  None           ENDOSCOPIC IMPRESSION:     1) Mild diverticulosis in the descending colon     2) Prior segmental colectomy in the sigmoid colon     3) Proctitis in the rectum     4) Internal hemorrhoids     5) Colostomy and Hartmanns' pouch  RECOMMENDATIONS:     1) Resume Coumadin (warfarin) today     2) Await pathology results     3) High fiber diet     4) Continue current colorectal screening recommendations for     "routine risk" patients with a repeat colonoscopy in 10 years.           Venita Lick. Russella Dar, MD, [name]           CC: Claud Kelp, MD           n.     REVISED:  12/30/2008 08:29 AM     eSIGNED:   Judie Petit T. Stark at 12/30/2008 08:29 AM           Estill Dooms, #[id number]  Note: An exclamation mark (!) indicates a result that was not dispersed into the flowsheet. Document Creation Date: 12/30/2008 8:29 AM _______________________________________________________________________  (1) Order result status: Final Collection or observation date-time: 12/25/2008 15:00 Requested date-time:  Receipt date-time:  Reported date-time:  Referring Physician:   Ordering Physician: Claudette Head [id number]) Specimen Source:  Source: Launa Grill Order Number: [id number] Lab site:   Appended Document: Colonoscopy     Procedures Next Due Date:    Colonoscopy: 12/2018

## 2010-03-18 NOTE — Letter (Signed)
Summary: [facility]   Imported By: Lester [place] 10/31/2008 09:04:15  _____________________________________________________________________  External Attachment:    Type:   Image     Comment:   External Document

## 2010-03-18 NOTE — Assessment & Plan Note (Signed)
Summary: PROTIME PER DR LOSEE//SLM  Nurse Visit     Allergies: No Known Drug Allergies  Laboratory Results   Blood Tests     PT: 14.3 s   (Normal Range: 10.6-13.4)  INR: 1.3   (Normal Range: 0.88-1.12   Therap INR: 2.0-3.5) Comments: Army Fossa CMA  August 22, 2008 3:06 PM      Orders Added: 1)  Est. Patient Level I [99211] 2)  Fingerstick [36416] 3)  Protime [84696EX]      ANTICOAGULATION RECORD PREVIOUS REGIMEN & LAB RESULTS Anticoagulation Diagnosis:  v58.83,v58.61, 453.41 on  02/13/2007 Previous INR Goal Range:  2-3 on  08/09/2007 Previous INR:  3.3 on  02/18/2008 Previous Coumadin Dose(mg):  5mg  QD on  08/09/2007 Previous Regimen:  same on  08/09/2007 Previous Coagulation Comments:  Patient will stop eating greens. on  08/09/2007  NEW REGIMEN & LAB RESULTS Current INR: 1.3 Regimen: same  (no change) Coagulation Comments: per dr.k leave the same.  Repeat testing in: 4 weeks   Anticoagulation Visit Questionnaire Coumadin dose missed/changed:  Yes Coumadin Dose Comments:  was in hospital  Abnormal Bleeding Symptoms:  No  Any diet changes including alcohol intake, vegetables or greens since the last visit:  Yes      Diet Comments:was in the hospital for 2 weeks  Any illnesses or hospitalizations since the last visit:  Yes      Recent Illness/Hospitalizations:  Ruptured colon was in the hospital for 2 weeks  Any signs of clotting since the last visit (including chest discomfort, dizziness, shortness of breath, arm tingling, slurred speech, swelling or redness in leg):  No  MEDICATIONS ATENOLOL 100 MG  TABS (ATENOLOL) Take 1 tablet by mouth once a day COUMADIN 5 MG  TABS (WARFARIN SODIUM) Take 1 tablet by mouth once a day NEXIUM 40 MG  CPDR (ESOMEPRAZOLE MAGNESIUM) Take 1 tab by mouth at bedtime NIASPAN 500 MG  TBCR (NIACIN (ANTIHYPERLIPIDEMIC)) Take 1 tablet by mouth once a day KLOR-CON M20 20 MEQ  TBCR (POTASSIUM CHLORIDE CRYS CR) Take 1 tablet by  mouth once a day LISINOPRIL 20 MG  TABS (LISINOPRIL) Take 1 tablet by mouth once a day NITROSTAT 0.4 MG  SUBL (NITROGLYCERIN) as needed,uad VIAGRA 50 MG  TABS (SILDENAFIL CITRATE) as needed, uad * EQUATE NASAL SPRAY  CRESTOR 40 MG  TABS (ROSUVASTATIN CALCIUM) 1 tab @ bedtime FUROSEMIDE 20 MG TABS (FUROSEMIDE) 2 in am, 1 @ noon

## 2010-03-18 NOTE — Progress Notes (Signed)
Summary: Ate a Salad  Phone Note Call from Patient Call back at [facility] Phone [phone]   Call For: Dr Russella Dar Summary of Call: Had salad with tomatoes and green peppers tuesday. Did not see that he was not supposed to eat this. Should he reschedule? Colon tomorrow at 3pm. Initial call taken by: Leanor [facility],  December 24, 2008 1:27 PM  Follow-up for Phone Call        Pt. told to drink plenty of clear liquids today to help flush the salad through his system. Follow-up by: Wyona Almas RN,  December 24, 2008 1:54 PM

## 2010-03-18 NOTE — Letter (Signed)
Summary: Anticoagulation Modification Letter  [facility]  [address] [place], [place] [address]   Phone: [phone]  Fax: [phone]    December 15, 2008  Re:    William Norman DOB:    29-Apr-1940 MRN:    [id number]    Dear Tawanna Cooler,   We have scheduled the above patient for an endoscopic procedure. Our records show that  he is on anticoagulation therapy. Please advise as to how long the patient may come off their therapy of Coumadin prior to the scheduled procedure(s) on 12/25/2008   Please route the completed form to Darcey Nora, RN at [facility].  Thank you for your help with this matter.  Sincerely,  Darcey Nora RN, [facility]   Physician Recommendation:  Hold Plavix 7 days prior ________________  Hold Coumadin 5 days prior ____________  Other ______________________________     Appended Document: Anticoagulation Modification Letter Per Kern Reap with Dr Tawanna Cooler patient is ok to hold the coumadin for 5 days  Appended Document: Anticoagulation Modification Letter I have left the patient a message to hold his coumadin 12-20-08 for his colonoscopy scheduled on 12-25-08.  I have asked him to call and confirm he has gotten this message  Appended Document: Anticoagulation Modification Letter I spoke with patient and he is aware

## 2010-03-18 NOTE — Letter (Signed)
Summary: [facility]   Imported By: Lester [place] 06/18/2009 08:06:32  _____________________________________________________________________  External Attachment:    Type:   Image     Comment:   External Document

## 2010-03-18 NOTE — Progress Notes (Signed)
Summary: ? hold coumadin  ---- Converted from flag ---- ---- 12/11/2008 9:03 AM, Meryl Dare MD [facility] wrote: OK to get clearance from his PCP for a 5 day hold and keep PV and colonoscopy appt as scheduled. ? whether he is taking it.  ---- 12/11/2008 8:21 AM, Wyona Almas RN wrote: Dr. Russella Dar, Mr. Dugal has a PV, Friday, 12/12/2008.  He has been on coumadin 5 mg. daily for Hx DVT.  Do you want him to hold it prior to colon on 12/25/2008?  Thanks, Almyra Brace ------------------------------

## 2010-03-18 NOTE — Assessment & Plan Note (Signed)
Summary: EMP//PT WILL FAST//LH   Vital Signs:  Patient profile:   70 year old male Height:      71 inches Weight:      229 pounds BMI:     32.05 Temp:     98.6 degrees F oral Pulse rate:   68 / minute BP sitting:   160 / 70  (left arm)  Vitals Entered By: Doristine Devoid (February 26, 2009 10:44 AM) CC: yearly exam and labs    Primary Care Provider:  Kelle Darting M.D  CC:  yearly exam and labs .  History of Present Illness:  [name] is a 70 year old, married male, nonsmoker, who comes in today for evaluation of multiple problems.  His underlying hypertension on atenolol 100 mg daily, lisinopril, 20 mg daily, one potassium supplement 20 mEq daily.  BP 160/70.  He will monitor home blood pressures x 3 weeks if BP does not draw back to normal.  He is to return for follow-up.  He takes Coumadin 5 mg daily pro times are normal.  Cardiology office.  He takes Nexium 40 mg daily for reflux esophagitis.  For hyperlipidemia.  He takes Niaspan 500 mg daily and Crestor 40 mg daily.  He's wondering if he can stop the Niaspan because of so expensive.    The last year because he had a colon perforation.  Colonoscopy in February 2010 by Dr. Arlyce Dice, which shows some diverticuli.  No polyps.  In June.  He has the sudden onset of spontaneous abdominal pain.  ,  Dr. Derrell Lolling saw him.  They took him to the OR and found he had a colon perf .  They removed 3 inches of his colon.  Dr. Derrell Lolling is planning to reattach his colon sometime in the near future.  He does have dry skin and would like a refill on the Lidex.  Pneumovax 2010, tetanus, 2006, seasonal flu 2010, cardiac evaluation, and Dr. Donnie Aho in the past month.  Normal.  Lipids done in Dr. Donnie Aho  ok.   Allergies: No Known Drug Allergies  Past History:  Past medical, surgical, family and social histories (including risk factors) reviewed, and no changes noted (except as noted below).  Past Medical History: Reviewed history from 08/04/2006 and no  changes required. CAD Hyperlipidemia Hypertension BPH GERD ECZEMA PERIPHERAL EDEMA  Past Surgical History: Reviewed history from 08/04/2006 and no changes required. Percutaneous transluminal coronary angioplasty T&A R HERNIA  DVT R LEG  Family History: Reviewed history from 03/17/2008 and no changes required. No FH of Colon Cancer: Family History of Heart Disease: father  Social History: Reviewed history from 03/17/2008 and no changes required. Retired Married Never Smoked Alcohol Use - yes rare Daily Caffeine Use Illicit Drug Use - no Patient gets regular exercise.  Review of Systems      See HPI  Physical Exam  General:  Well-developed,well-nourished,in no acute distress; alert,appropriate and cooperative throughout examination Head:  Normocephalic and atraumatic without obvious abnormalities. No apparent alopecia or balding. Eyes:  No corneal or conjunctival inflammation noted. EOMI. Perrla. Funduscopic exam benign, without hemorrhages, exudates or papilledema. Vision grossly normal. Ears:  External ear exam shows no significant lesions or deformities.  Otoscopic examination reveals clear canals, tympanic membranes are intact bilaterally without bulging, retraction, inflammation or discharge. Hearing is grossly normal bilaterally. Nose:  External nasal examination shows no deformity or inflammation. Nasal mucosa are pink and moist without lesions or exudates. Mouth:  Oral mucosa and oropharynx without lesions or exudates.  Teeth in good  repair. Neck:  No deformities, masses, or tenderness noted. Chest Wall:  No deformities, masses, tenderness or gynecomastia noted. Breasts:  No masses or gynecomastia noted Lungs:  Normal respiratory effort, chest expands symmetrically. Lungs are clear to auscultation, no crackles or wheezes. Heart:  Normal rate and regular rhythm. S1 and S2 normal without gallop, murmur, click, rub or other extra sounds. Abdomen:  colostomy pouch,  left lower quadrant, otherwise, abdomen negative Msk:  No deformity or scoliosis noted of thoracic or lumbar spine.   Pulses:  R and L carotid,radial,femoral,dorsalis pedis and posterior tibial pulses are full and equal bilaterally Extremities:  No clubbing, cyanosis, edema, or deformity noted with normal full range of motion of all joints.   Neurologic:  No cranial nerve deficits noted. Station and gait are normal. Plantar reflexes are down-going bilaterally. DTRs are symmetrical throughout. Sensory, motor and coordinative functions appear intact. Skin:  Intact without suspicious lesions or rashes Cervical Nodes:  No lymphadenopathy noted Axillary Nodes:  No palpable lymphadenopathy Inguinal Nodes:  No significant adenopathy Psych:  Cognition and judgment appear intact. Alert and cooperative with normal attention span and concentration. No apparent delusions, illusions, hallucinations   Impression & Recommendations:  Problem # 1:  HYPERTENSION (ICD-401.9) Assessment Improved  His updated medication list for this problem includes:    Atenolol 100 Mg Tabs (Atenolol) .Marland Kitchen... Take 1 tablet by mouth once a day    Lisinopril 20 Mg Tabs (Lisinopril) .Marland Kitchen... Take 1 tablet by mouth once a day    Furosemide 20 Mg Tabs (Furosemide) .Marland Kitchen... 2 in am, 1 @ noon  Orders: Venipuncture ([address]) TLB-BMP (Basic Metabolic Panel-BMET) (80048-METABOL) TLB-CBC Platelet - w/Differential (85025-CBCD) TLB-Hepatic/Liver Function Pnl (80076-HEPATIC) TLB-TSH (Thyroid Stimulating Hormone) (84443-TSH) TLB-PSA (Prostate Specific Antigen) (84153-PSA) Prescription Created Electronically [phone]) UA Dipstick w/o Micro (automated)  (81003)  Problem # 2:  HYPERLIPIDEMIA (ICD-272.4) Assessment: Improved  His updated medication list for this problem includes:    Niaspan 500 Mg Tbcr (Niacin (antihyperlipidemic)) .Marland Kitchen... Take 1 tablet by mouth once a day    Crestor 40 Mg Tabs (Rosuvastatin calcium) .Marland Kitchen... 1 tab @ bedtime     Niaspan 1000 Mg Cr-tabs (Niacin (antihyperlipidemic)) .Marland Kitchen... Take 1 tablet by mouth every morning  Orders: Venipuncture ([address]) TLB-BMP (Basic Metabolic Panel-BMET) (80048-METABOL) TLB-CBC Platelet - w/Differential (85025-CBCD) TLB-Hepatic/Liver Function Pnl (80076-HEPATIC) TLB-TSH (Thyroid Stimulating Hormone) (84443-TSH) TLB-PSA (Prostate Specific Antigen) (84153-PSA) Prescription Created Electronically [phone]) UA Dipstick w/o Micro (automated)  (81003)  Problem # 3:  GERD (ICD-530.81) Assessment: Improved  His updated medication list for this problem includes:    Nexium 40 Mg Cpdr (Esomeprazole magnesium) .Marland Kitchen... Take 1 tab by mouth at bedtime  Orders: Venipuncture ([address]) TLB-BMP (Basic Metabolic Panel-BMET) (80048-METABOL) TLB-CBC Platelet - w/Differential (85025-CBCD) TLB-Hepatic/Liver Function Pnl (80076-HEPATIC) TLB-TSH (Thyroid Stimulating Hormone) (84443-TSH) TLB-PSA (Prostate Specific Antigen) (84153-PSA)  Complete Medication List: 1)  Atenolol 100 Mg Tabs (Atenolol) .... Take 1 tablet by mouth once a day 2)  Coumadin 5 Mg Tabs (Warfarin sodium) .... Take 1 tablet by mouth once a day 3)  Nexium 40 Mg Cpdr (Esomeprazole magnesium) .... Take 1 tab by mouth at bedtime 4)  Niaspan 500 Mg Tbcr (Niacin (antihyperlipidemic)) .... Take 1 tablet by mouth once a day 5)  Klor-con M20 20 Meq Tbcr (Potassium chloride crys cr) .... Take 1 tablet by mouth once a day 6)  Lisinopril 20 Mg Tabs (Lisinopril) .... Take 1 tablet by mouth once a day 7)  Nitrostat 0.4 Mg Subl (Nitroglycerin) .... As needed,uad 8)  Viagra 50  Mg Tabs (Sildenafil citrate) .... As needed, uad 9)  Equate Nasal Spray  10)  Crestor 40 Mg Tabs (Rosuvastatin calcium) .Marland Kitchen.. 1 tab @ bedtime 11)  Furosemide 20 Mg Tabs (Furosemide) .... 2 in am, 1 @ noon 12)  Niaspan 1000 Mg Cr-tabs (Niacin (antihyperlipidemic)) .... Take 1 tablet by mouth every morning 13)  Lidex 0.05 % Crea (Fluocinonide) .... Apply at bedtime  Other  Orders: Protime (72536UY)  Patient Instructions: 1)  Please schedule a follow-up appointment in 1 year. 2)  he may use small amounts of Lidex, along with a moisturizing cream at bedtime as needed for the dry skin. 3)  Also during 30 ounces of water daily and run a vaporizer in her bedroom at night. Prescriptions: LIDEX 0.05 % CREA (FLUOCINONIDE) apply at bedtime  #60 gr x 4   Entered and Authorized by:   Roderick Pee MD   Signed by:   Roderick Pee MD on 02/26/2009   Method used:   Electronically to        [facility]  [phone]* (retail)       [name] [place], [place]  [address]       Ph: [phone] or [phone]       Fax: [phone]   RxID:   [id number] NIASPAN 1000 MG CR-TABS (NIACIN (ANTIHYPERLIPIDEMIC)) Take 1 tablet by mouth every morning  #100 x 3   Entered and Authorized by:   Roderick Pee MD   Signed by:   Roderick Pee MD on 02/26/2009   Method used:   Print then Give to Patient   RxID:   [id number] FUROSEMIDE 20 MG TABS (FUROSEMIDE) 2 in am, 1 @ noon  #300 x 3   Entered and Authorized by:   Roderick Pee MD   Signed by:   Roderick Pee MD on 02/26/2009   Method used:   Electronically to        [facility]  [phone]* (retail)       [address] [place], [place]  [address]       Ph: [phone] or [phone]       Fax: [phone]   RxID:   [id number] CRESTOR 40 MG  TABS (ROSUVASTATIN CALCIUM) 1 tab @ bedtime  #100 x 3   Entered and Authorized by:   Roderick Pee MD   Signed by:   Roderick Pee MD on 02/26/2009   Method used:   Electronically to        [facility]  [phone]* (retail)       [address] [place], [place]  [address]       Ph: [phone] or [phone]       Fax: [phone]   RxID:   [id number] VIAGRA 50 MG  TABS (SILDENAFIL CITRATE) as needed, uad  #6 x 11   Entered and Authorized by:   Roderick Pee MD   Signed by:   Roderick Pee MD on 02/26/2009   Method used:    Electronically to        [facility]  [phone]* (retail)       [address] [place], [place]  [address]       Ph: [phone] or [phone]       Fax: [phone]   RxID:   [id number]  NITROSTAT 0.4 MG  SUBL (NITROGLYCERIN) as needed,uad  #25 x 1   Entered and Authorized by:   Roderick Pee MD   Signed by:   Roderick Pee MD on 02/26/2009   Method used:   Electronically to        [facility]  [phone]* (retail)       [address] [place], [place]  [address]       Ph: [phone] or [phone]       Fax: [phone]   RxID:   [id number] LISINOPRIL 20 MG  TABS (LISINOPRIL) Take 1 tablet by mouth once a day  #100 x 3   Entered and Authorized by:   Roderick Pee MD   Signed by:   Roderick Pee MD on 02/26/2009   Method used:   Electronically to        [facility]  [phone]* (retail)       [address] [place], [place]  [address]       Ph: [phone] or [phone]       Fax: [phone]   RxID:   [id number] KLOR-CON M20 20 MEQ  TBCR (POTASSIUM CHLORIDE CRYS CR) Take 1 tablet by mouth once a day  #100 x 3   Entered and Authorized by:   Roderick Pee MD   Signed by:   Roderick Pee MD on 02/26/2009   Method used:   Electronically to        [facility]  [phone]* (retail)       [address] [place], [place]  [address]       Ph: [phone] or [phone]       Fax: [phone]   RxID:   [id number] NEXIUM 40 MG  CPDR (ESOMEPRAZOLE MAGNESIUM) Take 1 tab by mouth at bedtime  #100 x 3   Entered and Authorized by:   Roderick Pee MD   Signed by:   Roderick Pee MD on 02/26/2009   Method used:   Electronically to        [facility]  [phone]* (retail)       [address] [place], [place]  [address]       Ph: [phone] or [phone]       Fax: [phone]   RxID:   [id number] COUMADIN 5 MG  TABS (WARFARIN SODIUM) Take 1 tablet by mouth once a day  #100 x 3   Entered and  Authorized by:   Roderick Pee MD   Signed by:   Roderick Pee MD on 02/26/2009   Method used:   Electronically to        [facility]  [phone]* (retail)       [address] [place], [place]  [address]       Ph: [phone] or [phone]       Fax: [phone]   RxID:   [id number] ATENOLOL 100 MG  TABS (ATENOLOL) Take 1 tablet by mouth once a day  #100 x 3   Entered and Authorized by:   Roderick Pee MD   Signed by:   Roderick Pee MD on 02/26/2009   Method used:   Electronically to        [facility]  [address]  [phone]* (retail)  [address] [place], [place]  [address]       Ph: [phone] or [phone]       Fax: [phone]   RxID:   [id number]     ANTICOAGULATION RECORD PREVIOUS REGIMEN & LAB RESULTS Anticoagulation Diagnosis:  v58.83,v58.61, 453.41 on  02/13/2007 Previous INR Goal Range:  2-3 on  08/09/2007 Previous INR:  2.1 on  01/12/2009 Previous Coumadin Dose(mg):  5mg  QD on  08/09/2007 Previous Regimen:  same on  01/12/2009 Previous Coagulation Comments:  per dr.k leave the same.  on  08/22/2008  NEW REGIMEN & LAB RESULTS Current INR: 2.3 Regimen: same  (no change)   Anticoagulation Visit Questionnaire Coumadin dose missed/changed:  No Abnormal Bleeding Symptoms:  No  Any diet changes including alcohol intake, vegetables or greens since the last visit:  No Any illnesses or hospitalizations since the last visit:  No Any signs of clotting since the last visit (including chest discomfort, dizziness, shortness of breath, arm tingling, slurred speech, swelling or redness in leg):  No  MEDICATIONS ATENOLOL 100 MG  TABS (ATENOLOL) Take 1 tablet by mouth once a day COUMADIN 5 MG  TABS (WARFARIN SODIUM) Take 1 tablet by mouth once a day NEXIUM 40 MG  CPDR (ESOMEPRAZOLE MAGNESIUM) Take 1 tab by mouth at bedtime NIASPAN 500 MG  TBCR (NIACIN (ANTIHYPERLIPIDEMIC)) Take 1 tablet by mouth once a day KLOR-CON M20 20 MEQ  TBCR  (POTASSIUM CHLORIDE CRYS CR) Take 1 tablet by mouth once a day LISINOPRIL 20 MG  TABS (LISINOPRIL) Take 1 tablet by mouth once a day NITROSTAT 0.4 MG  SUBL (NITROGLYCERIN) as needed,uad VIAGRA 50 MG  TABS (SILDENAFIL CITRATE) as needed, uad * EQUATE NASAL SPRAY  CRESTOR 40 MG  TABS (ROSUVASTATIN CALCIUM) 1 tab @ bedtime FUROSEMIDE 20 MG TABS (FUROSEMIDE) 2 in am, 1 @ noon NIASPAN 1000 MG CR-TABS (NIACIN (ANTIHYPERLIPIDEMIC)) Take 1 tablet by mouth every morning LIDEX 0.05 % CREA (FLUOCINONIDE) apply at bedtime    Laboratory Results   Blood Tests     PT: 18.4 s   (Normal Range: 10.6-13.4)  INR: 2.3   (Normal Range: 0.88-1.12   Therap INR: 2.0-3.5) Comments: Joanne Chars CMA  February 26, 2009 11:58 AM

## 2010-03-18 NOTE — Letter (Signed)
Summary: Patient Notice- Colon Biospy Results  [facility]  [address] [place], [place] [address]   Phone: [phone]  Fax: [phone]        December 29, 2008 MRN: [id number]    [address] [place], [place]  [address]    Dear Mr. Weatherford,  I am pleased to inform you that the biopsies taken during your recent colonoscopy did not show any evidence of cancer upon pathologic examination. The biopsies were normal.  Continue with the treatment plan as outlined on the day of your      exam.  You should have a repeat colonoscopy examination in 10 years.  Please call us if you are having persistent problems or have questions about your condition that have not been fully answered at this time.  Sincerely,  Meryl Dare MD [facility]  This letter has been electronically signed by your physician.  Appended Document: Patient Notice- Colon Biospy Results Letter mailed 11.17.10.

## 2010-03-18 NOTE — Assessment & Plan Note (Signed)
Summary: 1 week roa/jls   Vital Signs:  Patient Profile:   70 Years Old Male Weight:      226 pounds (102.73 kg) Temp:     98.1 degrees F (36.72 degrees C) oral Pulse rate:   56 / minute BP sitting:   114 / 68  (right arm)  Pt. in pain?   yes    Location:   back    Intensity:   5    Type:       aching  Vitals Entered By: Arcola Jansky, RN (February 20, 2007 10:53 AM)                  Chief Complaint:  1)F/U BACK PAIN       2)SINUSES.  History of Present Illness: he is a 70 year old male comes back today for follow-up of back pain.  He was seen last week for evaluation of back pain.  At that time.  His neurologic examination was negative.  He was started on prednisone 20-mg tablets two a day for 3 days with a taper.  He was also given Flexeril and Vicodin to take 3 times a day as needed for pain.  He comes back today for follow-up saying is 50% better.  His x-rays were normal.  The  Current Allergies (reviewed today): No known allergies      Review of Systems      See HPI   Physical Exam  General:     Well-developed,well-nourished,in no acute distress; alert,appropriate and cooperative throughout examination Msk:     No deformity or scoliosis noted of thoracic or lumbar spine.   Pulses:     R and L carotid,radial,femoral,dorsalis pedis and posterior tibial pulses are full and equal bilaterally Extremities:     No clubbing, cyanosis, edema, or deformity noted with normal full range of motion of all joints.   Neurologic:     No cranial nerve deficits noted. Station and gait are normal. Plantar reflexes are down-going bilaterally. DTRs are symmetrical throughout. Sensory, motor and coordinative functions appear intact.straight leg positive right leg at 45 degrees, negative left    Impression & Recommendations:  Problem # 1:  DISC DISEASE, LUMBAR (ICD-722.52) Assessment: Improved  Complete Medication List: 1)  Atenolol 100 Mg Tabs (Atenolol) .... Take 1  tablet by mouth once a day 2)  Coumadin 5 Mg Tabs (Warfarin sodium) .... Take 1 tablet by mouth once a day 3)  Nexium 40 Mg Cpdr (Esomeprazole magnesium) .... Take 1 tab by mouth at bedtime 4)  Niaspan 500 Mg Tbcr (Niacin (antihyperlipidemic)) .... Take 1 tablet by mouth once a day 5)  Lasix 20 Mg Tabs (Furosemide) .... Stopped 6)  Klor-con M20 20 Meq Tbcr (Potassium chloride crys cr) .... Take 1 tablet by mouth once a day 7)  Lisinopril 20 Mg Tabs (Lisinopril) .... Take 1 tablet by mouth once a day 8)  Nitrostat 0.4 Mg Subl (Nitroglycerin) .... As needed,uad 9)  Viagra 50 Mg Tabs (Sildenafil citrate) .... As needed, uad 10)  Prednisone 20 Mg Tabs (Prednisone) .... Uad 11)  Flexeril 10 Mg Tabs (Cyclobenzaprine hcl) .Marland Kitchen.. 1 tab @ bedtime 12)  Vicodin Es 7.5-750 Mg Tabs (Hydrocodone-acetaminophen) .Marland Kitchen.. 1 tab @ bedtime 13)  Equate Nasal Spray  14)  Crestor 40 Mg Tabs (Rosuvastatin calcium) .Marland Kitchen.. 1 tab @ bedtime   Patient Instructions: 1)  taper off the present dose of prednisone.  Use Motrin, 600 mg 3 times a day as needed for minor  discomfort.  Take Flexeril and/or Vicodin at bedtime as needed for pain.  Return p.r.n.    Prescriptions: CRESTOR 40 MG  TABS (ROSUVASTATIN CALCIUM) 1 tab @ bedtime  #100 x 4   Entered and Authorized by:   Roderick Pee MD   Signed by:   Roderick Pee MD on 02/20/2007   Method used:   Print then Give to Patient   RxID:   [id number]  ]

## 2010-03-18 NOTE — Assessment & Plan Note (Signed)
Summary: 1 WK ROV/NJR   Vital Signs:  Patient Profile:   70 Years Old Male Height:     71 inches Weight:      238 pounds Temp:     98.2 degrees F oral BP sitting:   134 / 84  (left arm) Cuff size:   regular  Vitals Entered By: Kern Reap CMA (March 10, 2008 1:40 PM)                 Chief Complaint:  follow up left leg .  History of Present Illness: is a 70 year old male, who comes back today for evaluation of the cellulitis of his left lower extremity.  He had a contusion with a bruise and a slight laceration to midway between the knee and the ankle just over the tibia anteriorly.  He developed a secondary cellulitis.  We cut them on an ABI elevation et Karie Soda.  It's markedly improved.  He also has significant edema.  That lower extremity and a week ago was started on Lasix 20 mg b.i.d., but is not taking Lasix.  He says he can't remember to do that.  His leg is still swollen.  I explained the importance of decreasing the edema also, the downside of developing severe problems, including osteomyelitis of the tibia.    Current Allergies: No known allergies   Past Medical History:    Reviewed history from 08/04/2006 and no changes required:       CAD       Hyperlipidemia       Hypertension       BPH       GERD       ECZEMA       PERIPHERAL EDEMA   Social History:    Reviewed history from 02/13/2007 and no changes required:       Retired       Married       Never Smoked    Review of Systems      See HPI   Physical Exam  General:     Well-developed,well-nourished,in no acute distress; alert,appropriate and cooperative throughout examination Msk:     No deformity or scoliosis noted of thoracic or lumbar spine.   Extremities:     2+ left pedal edema and 2+ right pedal edema.   Skin:     wound is healing.  There is a scab.  There was no pus.    Impression & Recommendations:  Problem # 1:  CELLULITIS AND ABSCESS OF OTHER SPECIFIED SITE  [phone]) Assessment: Improved  His updated medication list for this problem includes:    Keflex 500 Mg Caps (Cephalexin) .Marland Kitchen... Take 2 tablet by mouth two times a day   Problem # 2:  PERIPHERAL EDEMA (ICD-782.3) Assessment: Unchanged  His updated medication list for this problem includes:    Furosemide 20 Mg Tabs (Furosemide) .Marland Kitchen... Take 1 tablet by mouth two times a day   Complete Medication List: 1)  Atenolol 100 Mg Tabs (Atenolol) .... Take 1 tablet by mouth once a day 2)  Coumadin 5 Mg Tabs (Warfarin sodium) .... Take 1 tablet by mouth once a day 3)  Nexium 40 Mg Cpdr (Esomeprazole magnesium) .... Take 1 tab by mouth at bedtime 4)  Niaspan 500 Mg Tbcr (Niacin (antihyperlipidemic)) .... Take 1 tablet by mouth once a day 5)  Klor-con M20 20 Meq Tbcr (Potassium chloride crys cr) .... Take 1 tablet by mouth once a day 6)  Lisinopril 20 Mg Tabs (  Lisinopril) .... Take 1 tablet by mouth once a day 7)  Nitrostat 0.4 Mg Subl (Nitroglycerin) .... As needed,uad 8)  Viagra 50 Mg Tabs (Sildenafil citrate) .... As needed, uad 9)  Equate Nasal Spray  10)  Crestor 40 Mg Tabs (Rosuvastatin calcium) .Marland Kitchen.. 1 tab @ bedtime 11)  Keflex 500 Mg Caps (Cephalexin) .... Take 2 tablet by mouth two times a day 12)  Furosemide 20 Mg Tabs (Furosemide) .... Take 1 tablet by mouth two times a day   Patient Instructions: 1)  take Lasix twice a day, as outlined.  Return in two weeks for follow-up.  If at any point, you feel, like it's getting infected again restart the antibiotics, and immediately

## 2010-03-18 NOTE — Letter (Signed)
Summary: [facility]   Imported By: Sherian Rein 01/23/2009 12:19:42  _____________________________________________________________________  External Attachment:    Type:   Image     Comment:   External Document

## 2010-03-18 NOTE — Assessment & Plan Note (Signed)
Summary: ISSUES WITH CRESTOR MED//ALP   Vital Signs:  Patient profile:   70 year old male Weight:      217 pounds Temp:     98.0 degrees F BP sitting:   150 / 80  (right arm) CC: Med prob Crestor   Primary Care Provider:  Kelle Darting M.D  CC:  Med prob Crestor.  History of Present Illness: William Norman is a 70 year old male, who comes in today for follow-up of 3 issues.  He is been taking Nexium 40 mg daily for reflux esophagitis.  However, because of cost he would like to switch to omeprazole.  He stopped taking his 40 mg of Crestor daily because his severe muscle pain.  Since he stopped it two weeks ago.  His symptoms have all gone away.  He feels well.  He's due for a protime today.  In March Dr. Derrell Lolling took down his colostomy.  re[-  anastomosis went well.  No side effects except for some postop diarrhea.  They resolve with some oral antibiotics.  Allergies: No Known Drug Allergies  Past History:  Past medical, surgical, family and social histories (including risk factors) reviewed for relevance to current acute and chronic problems.  Past Medical History: Reviewed history from 08/04/2006 and no changes required. CAD Hyperlipidemia Hypertension BPH GERD ECZEMA PERIPHERAL EDEMA  Past Surgical History: Reviewed history from 08/04/2006 and no changes required. Percutaneous transluminal coronary angioplasty T&A R HERNIA  DVT R LEG  Family History: Reviewed history from 03/17/2008 and no changes required. No FH of Colon Cancer: Family History of Heart Disease: father  Social History: Reviewed history from 03/17/2008 and no changes required. Retired Married Never Smoked Alcohol Use - yes rare Daily Caffeine Use Illicit Drug Use - no Patient gets regular exercise.  Review of Systems      See HPI  Physical Exam  General:  Well-developed,well-nourished,in no acute distress; alert,appropriate and cooperative throughout examination   Impression &  Recommendations:  Problem # 1:  ENCOUNTER FOR THERAPEUTIC DRUG MONITORING (ICD-V58.83) Assessment Unchanged  Orders: Protime ([id number]) Fingerstick ([id number])  Problem # 2:  GERD (ICD-530.81) Assessment: Improved  The following medications were removed from the medication list:    Nexium 40 Mg Cpdr (Esomeprazole magnesium) .Marland Kitchen... Take 1 tab by mouth at bedtime His updated medication list for this problem includes:    Prilosec 40 Mg Cpdr (Omeprazole) .Marland Kitchen... Take 1 tablet by mouth every morning  Problem # 3:  HYPERLIPIDEMIA (ICD-272.4) Assessment: Unchanged  The following medications were removed from the medication list:    Crestor 40 Mg Tabs (Rosuvastatin calcium) .Marland Kitchen... 1 tab @ bedtime His updated medication list for this problem includes:    Niaspan 500 Mg Tbcr (Niacin (antihyperlipidemic)) .Marland Kitchen... Take 1 tablet by mouth once a day    Niaspan 1000 Mg Cr-tabs (Niacin (antihyperlipidemic)) .Marland Kitchen... Take 1 tablet by mouth every morning  Complete Medication List: 1)  Atenolol 100 Mg Tabs (Atenolol) .... Take 1 tablet by mouth once a day 2)  Coumadin 5 Mg Tabs (Warfarin sodium) .... Take 1 tablet by mouth once a day 3)  Niaspan 500 Mg Tbcr (Niacin (antihyperlipidemic)) .... Take 1 tablet by mouth once a day 4)  Klor-con M20 20 Meq Tbcr (Potassium chloride crys cr) .... Take 1 tablet by mouth once a day 5)  Lisinopril 20 Mg Tabs (Lisinopril) .... Take 1 tablet by mouth once a day 6)  Nitrostat 0.4 Mg Subl (Nitroglycerin) .... As needed,uad 7)  Viagra 50 Mg Tabs (Sildenafil citrate) .Marland KitchenMarland KitchenMarland Kitchen  As needed, uad 8)  Equate Nasal Spray  9)  Niaspan 1000 Mg Cr-tabs (Niacin (antihyperlipidemic)) .... Take 1 tablet by mouth every morning 10)  Prilosec 40 Mg Cpdr (Omeprazole) .... Take 1 tablet by mouth every morning  Patient Instructions: 1)  restart the Crestor by taking a half a tablet Monday, and Friday.if with a half a tablet twice weekly y  symptoms of muscle pain return .......... stopped the Crestor,  completely 2)  We will check your protime today. 3)  I will write her a prescription for Prilosec 40 mg daily instead of the Nexium Prescriptions: PRILOSEC 40 MG CPDR (OMEPRAZOLE) Take 1 tablet by mouth every morning  #100 x 3   Entered and Authorized by:   Roderick Pee MD   Signed by:   Roderick Pee MD on 06/26/2009   Method used:   [facility] to        [phone] (retail)       [address] [place], [place]  [address]       Ph: [phone] or [phone]       Fax: [phone]   RxID:   [id number]       ANTICOAGULATION RECORD PREVIOUS REGIMEN & LAB RESULTS Anticoagulation Diagnosis:  v58.83,v58.61, 453.41 on  02/13/2007 Previous INR Goal Range:  2-3 on  08/09/2007 Previous INR:  2.3 on  02/26/2009 Previous Coumadin Dose(mg):  5mg  QD on  08/09/2007 Previous Regimen:  same on  01/12/2009 Previous Coagulation Comments:  per dr.k leave the same.  on  08/22/2008  NEW REGIMEN & LAB RESULTS Regimen: same  (no change)   Anticoagulation Visit Questionnaire Coumadin dose missed/changed:  No Abnormal Bleeding Symptoms:  No  Any diet changes including alcohol intake, vegetables or greens since the last visit:  No Any illnesses or hospitalizations since the last visit:  Yes      Recent Illness/Hospitalizations:  1 week in March Any signs of clotting since the last visit (including chest discomfort, dizziness, shortness of breath, arm tingling, slurred speech, swelling or redness in leg):  No  MEDICATIONS ATENOLOL 100 MG  TABS (ATENOLOL) Take 1 tablet by mouth once a day COUMADIN 5 MG  TABS (WARFARIN SODIUM) Take 1 tablet by mouth once a day NIASPAN 500 MG  TBCR (NIACIN (ANTIHYPERLIPIDEMIC)) Take 1 tablet by mouth once a day KLOR-CON M20 20 MEQ  TBCR (POTASSIUM CHLORIDE CRYS CR) Take 1 tablet by mouth once a day LISINOPRIL 20 MG  TABS (LISINOPRIL) Take 1 tablet by mouth once a day NITROSTAT 0.4 MG  SUBL (NITROGLYCERIN) as needed,uad VIAGRA  50 MG  TABS (SILDENAFIL CITRATE) as needed, uad * EQUATE NASAL SPRAY  NIASPAN 1000 MG CR-TABS (NIACIN (ANTIHYPERLIPIDEMIC)) Take 1 tablet by mouth every morning PRILOSEC 40 MG CPDR (OMEPRAZOLE) Take 1 tablet by mouth every morning

## 2010-03-18 NOTE — Miscellaneous (Signed)
Summary: Waiver of Liability for [employer]  Waiver of Liability for [employer]   Imported By: Maryln Gottron 03/11/2010 10:15:45  _____________________________________________________________________  External Attachment:    Type:   Image     Comment:   External Document

## 2010-03-18 NOTE — Progress Notes (Signed)
Summary: ? about coumadin  Phone Note Outgoing Call Call back at [facility] Phone [phone]   Call placed by: Darcey Nora RN, CGRN,  December 11, 2008 9:06 AM Call placed to: Patient Summary of Call: I have left a message for the patient to call back and let me know if he is on coumadin. Initial call taken by: Darcey Nora RN, CGRN,  December 11, 2008 9:07 AM  Follow-up for Phone Call        Pt. is taking coumadin 5 mg. daily. Follow-up by: Wyona Almas RN,  December 12, 2008 4:18 PM

## 2010-03-18 NOTE — Procedures (Signed)
Summary: Colonoscopy/[facility]  [facility]   Imported By: Lanelle Bal 03/26/2008 09:44:07  _____________________________________________________________________  External Attachment:    Type:   Image     Comment:   External Document

## 2010-03-18 NOTE — Progress Notes (Signed)
Summary: switc drs  Phone Note Call from Patient Call back at Home Phone [phone]   Caller: Patient Call For: William Norman Reason for Call: Talk to Nurse Summary of Call: Patient wants to switch from Dr Arlyce Dice to Dr William Norman because he likes Dr William Norman  Initial call taken by: Tawni Levy,  November 24, 2008 4:44 PM  Follow-up for Phone Call        DR.KAPLAN--Do you approve switch? Follow-up by: Laureen Ochs LPN,  November 24, 2008 5:03 PM  Additional Follow-up for Phone Call Additional follow up Details #1::        ok Additional Follow-up by: Louis Meckel MD,  November 26, 2008 9:33 PM     Appended Document: switc drs Dr William Dar do you approve?????  Appended Document: switc drs ok

## 2010-03-18 NOTE — Letter (Signed)
Summary: [facility]   Imported By: Lester Metamora 12/01/2008 08:13:28  _____________________________________________________________________  External Attachment:    Type:   Image     Comment:   External Document

## 2010-03-18 NOTE — Assessment & Plan Note (Signed)
Summary: DIR COL//BLOOD THINNERS...AS.   History of Present Illness Visit Type: consult Primary GI MD: Melvia Heaps MD [facility] Primary Jerlyn Pain: Kelle Darting M.D Chief Complaint: Colon on bloodthinners History of Present Illness:   Mr. William Norman is a pleasant 70 year old white male patient of Dr. Renda Rolls was here to sit up screening colonoscopy.  His  last exam in 2002 demonstrated a hyperplastic polyp and diverticulosis.  He has no  GI  complaints including change in bowel habits, abdominal pain, melena or hematochezia.  His Coumadin because of a history of DVT.    GI Review of Systems      Denies abdominal pain, acid reflux, belching, bloating, chest pain, dysphagia with liquids, dysphagia with solids, heartburn, loss of appetite, nausea, vomiting, vomiting blood, weight loss, and  weight gain.        Denies anal fissure, black tarry stools, change in bowel habit, constipation, diarrhea, diverticulosis, fecal incontinence, heme positive stool, hemorrhoids, irritable bowel syndrome, jaundice, light color stool, liver problems, rectal bleeding, and  rectal pain.     Updated Prior Medication List: ATENOLOL 100 MG  TABS (ATENOLOL) Take 1 tablet by mouth once a day COUMADIN 5 MG  TABS (WARFARIN SODIUM) Take 1 tablet by mouth once a day NEXIUM 40 MG  CPDR (ESOMEPRAZOLE MAGNESIUM) Take 1 tab by mouth at bedtime NIASPAN 500 MG  TBCR (NIACIN (ANTIHYPERLIPIDEMIC)) Take 1 tablet by mouth once a day KLOR-CON M20 20 MEQ  TBCR (POTASSIUM CHLORIDE CRYS CR) Take 1 tablet by mouth once a day LISINOPRIL 20 MG  TABS (LISINOPRIL) Take 1 tablet by mouth once a day NITROSTAT 0.4 MG  SUBL (NITROGLYCERIN) as needed,uad VIAGRA 50 MG  TABS (SILDENAFIL CITRATE) as needed, uad * EQUATE NASAL SPRAY  CRESTOR 40 MG  TABS (ROSUVASTATIN CALCIUM) 1 tab @ bedtime FUROSEMIDE 20 MG TABS (FUROSEMIDE) Take 1 tablet by mouth two times a day  Current Allergies (reviewed today): No known allergies   Past Medical  History:    Reviewed history from 08/04/2006 and no changes required:       CAD       Hyperlipidemia       Hypertension       BPH       GERD       ECZEMA       PERIPHERAL EDEMA  Past Surgical History:    Reviewed history from 08/04/2006 and no changes required:       Percutaneous transluminal coronary angioplasty       T&A       R HERNIA        DVT R LEG   Family History:    No FH of Colon Cancer:    Family History of Heart Disease: father  Social History:    Retired    Married    Never Smoked    Alcohol Use - yes rare    Daily Caffeine Use    Illicit Drug Use - no    Patient gets regular exercise.   Risk Factors:  Drug use:  no Alcohol use:  yes Exercise:  yes   Review of Systems       The patient complains of allergy/sinus, swelling of feet/legs, urination - excessive, and urine leakage.         Review of systems is otherwise negative    Vital Signs:  Patient Profile:   70 Years Old Male Height:     71 inches Weight:      236.50 pounds BMI:  33.10 BSA:     2.27 Pulse rate:   58 / minute Pulse rhythm:   regular BP sitting:   130 / 72  (left arm)  Vitals Entered By: Paulene Floor, RN (March 17, 2008 2:25 PM)                  Physical Exam  He is a well-developed well-nourished male  skin: anicteric HEENT: normocephalic; PEERLA; no nasal or pharyngeal abnormalities neck: supple nodes: no cervical lymphadenopathy chest: clear to ausculatation and percussion heart: no murmurs, gallops, or rubs abd: soft, nontender; BS normoactive; no abdominal masses, tenderness, organomegaly rectal: deferred ext: no cynanosis, clubbing, edema skeletal: no deformities neuro: oriented x 3; no focal abnormalities     Impression & Recommendations:  Problem # 1:  POLYP, COLON (ICD-211.3) Patient has a history of hyperplastic polyps.  He is about due for screening colonoscopy.  Recommendations #1 colonoscopy.  Coumadin will be held in anticipation  of this exam  Problem # 2:  COAGULOPATHY, COUMADIN-INDUCED (ICD-286.5) Assessment: Comment Only   Patient Instructions: 1)  Colonoscopy and Flexible Sigmoidoscopy brochure given. 2)  Conscious Sedation brochure given.     Appended Document: Orders Update    Clinical Lists Changes  Problems: Added new problem of SPECIAL SCREENING FOR MALIGNANT NEOPLASMS COLON (ICD-V76.51) Medications: Added new medication of MIRALAX   POWD (POLYETHYLENE GLYCOL 3350) As per prep  instructions. - Signed Added new medication of REGLAN 10 MG  TABS (METOCLOPRAMIDE HCL) As per prep instructions. - Signed Added new medication of DULCOLAX 5 MG  TBEC (BISACODYL) Day before procedure take 2 at 3pm and 2 at 8pm. - Signed Rx of MIRALAX   POWD (POLYETHYLENE GLYCOL 3350) As per prep  instructions.;  #255gm x 0;  Signed;  Entered by: Merri Ray CMA;  Authorized by: Louis Meckel MD;  Method used: [facility]  [phone]*, [address], [place], [place]  [address], Ph: [phone] or [phone], Fax: [phone] Rx of REGLAN 10 MG  TABS (METOCLOPRAMIDE HCL) As per prep instructions.;  #2 x 0;  Signed;  Entered by: Merri Ray CMA;  Authorized by: Louis Meckel MD;  Method used: Electronically to [facility]  [phone]*, [address] [place], [place]  [address], Ph: [phone] or [phone], Fax: [phone] Rx of DULCOLAX 5 MG  TBEC (BISACODYL) Day before procedure take 2 at 3pm and 2 at 8pm.;  #4 x 0;  Signed;  Entered by: Merri Ray CMA;  Authorized by: Louis Meckel MD;  Method used: Electronically to [facility]  [phone]*, [address], [place], [place]  [address], Ph: [phone] or [phone], Fax: [phone] Orders: Added new Test order of Colonoscopy (Colon) - Signed    Prescriptions: DULCOLAX 5 MG  TBEC (BISACODYL) Day before procedure take 2 at 3pm and 2 at 8pm.  #4 x 0   Entered by:   Merri Ray CMA    Authorized by:   Louis Meckel MD   Signed by:   Merri Ray CMA on 03/17/2008   Method used:   Electronically to        [facility]  [phone]* (retail)       [address] [place], [place]  [address]       Ph: [phone] or [phone]       Fax: [phone]   RxID:   [id number] REGLAN 10  MG  TABS (METOCLOPRAMIDE HCL) As per prep instructions.  #2 x 0   Entered by:   Merri Ray CMA   Authorized by:   Louis Meckel MD   Signed by:   Merri Ray CMA on 03/17/2008   Method used:   Electronically to        [facility]  [phone]* (retail)       [address] [place], [place]  [address]       Ph: [phone] or [phone]       Fax: [phone]   RxID:   [id number] MIRALAX   POWD (POLYETHYLENE GLYCOL 3350) As per prep  instructions.  #255gm x 0   Entered by:   Merri Ray CMA   Authorized by:   Louis Meckel MD   Signed by:   Merri Ray CMA on 03/17/2008   Method used:   Electronically to        [facility]  [phone]* (retail)       [address] [place], [place]  [address]       Ph: [phone] or [phone]       Fax: [phone]   RxID:   [id number]

## 2010-03-18 NOTE — Assessment & Plan Note (Signed)
Summary: pt/njr  Nurse Visit   Vital Signs:  Patient Profile:   70 Years Old Male Pulse rate:   54 / minute BP sitting:   114 / 68  (left arm)                 Prior Medications: ATENOLOL 100 MG  TABS (ATENOLOL) Take 1 tablet by mouth once a day COUMADIN 5 MG  TABS (WARFARIN SODIUM) Take 1 tablet by mouth once a day NEXIUM 40 MG  CPDR (ESOMEPRAZOLE MAGNESIUM) Take 1 tab by mouth at bedtime NIASPAN 500 MG  TBCR (NIACIN (ANTIHYPERLIPIDEMIC)) Take 1 tablet by mouth once a day LASIX 20 MG  TABS (FUROSEMIDE) stopped KLOR-CON M20 20 MEQ  TBCR (POTASSIUM CHLORIDE CRYS CR) Take 1 tablet by mouth once a day LISINOPRIL 20 MG  TABS (LISINOPRIL) Take 1 tablet by mouth once a day NITROSTAT 0.4 MG  SUBL (NITROGLYCERIN) as needed,uad VIAGRA 50 MG  TABS (SILDENAFIL CITRATE) as needed, uad PREDNISONE 20 MG  TABS (PREDNISONE) UAD FLEXERIL 10 MG  TABS (CYCLOBENZAPRINE HCL) 1 tab @ bedtime VICODIN ES 7.5-750 MG  TABS (HYDROCODONE-ACETAMINOPHEN) 1 tab @ bedtime EQUATE NASAL SPRAY ()  CRESTOR 40 MG  TABS (ROSUVASTATIN CALCIUM) 1 tab @ bedtime Current Allergies: No known allergies  Laboratory Results   Blood Tests    Date/Time Reported: November 12, 2007 12:15 PM   PT: 17.5 s   (Normal Range: 10.6-13.4)  INR: 2.0   (Normal Range: 0.88-1.12   Therap INR: 2.0-3.5) Comments: Wynona Canes, CMA  November 12, 2007 12:16 PM         ] Laboratory Results   Blood Tests     PT: 17.5 s   (Normal Range: 10.6-13.4)  INR: 2.0   (Normal Range: 0.88-1.12   Therap INR: 2.0-3.5) Comments: Wynona Canes, CMA  November 12, 2007 12:16 PM       ANTICOAGULATION RECORD PREVIOUS REGIMEN & LAB RESULTS Anticoagulation Diagnosis:  v58.83,v58.61, 453.41 on  02/13/2007 Previous INR Goal Range:  2-3 on  08/09/2007 Previous INR:  1.8 on  08/09/2007 Previous Coumadin Dose(mg):  5mg  QD on  08/09/2007 Previous Regimen:  same on  08/09/2007 Previous Coagulation Comments:  Patient will stop  eating greens. on  08/09/2007  NEW REGIMEN & LAB RESULTS Current INR: 2.0 Regimen: same  (no change)  MEDICATIONS ATENOLOL 100 MG  TABS (ATENOLOL) Take 1 tablet by mouth once a day COUMADIN 5 MG  TABS (WARFARIN SODIUM) Take 1 tablet by mouth once a day NEXIUM 40 MG  CPDR (ESOMEPRAZOLE MAGNESIUM) Take 1 tab by mouth at bedtime NIASPAN 500 MG  TBCR (NIACIN (ANTIHYPERLIPIDEMIC)) Take 1 tablet by mouth once a day LASIX 20 MG  TABS (FUROSEMIDE) stopped KLOR-CON M20 20 MEQ  TBCR (POTASSIUM CHLORIDE CRYS CR) Take 1 tablet by mouth once a day LISINOPRIL 20 MG  TABS (LISINOPRIL) Take 1 tablet by mouth once a day NITROSTAT 0.4 MG  SUBL (NITROGLYCERIN) as needed,uad VIAGRA 50 MG  TABS (SILDENAFIL CITRATE) as needed, uad PREDNISONE 20 MG  TABS (PREDNISONE) UAD FLEXERIL 10 MG  TABS (CYCLOBENZAPRINE HCL) 1 tab @ bedtime VICODIN ES 7.5-750 MG  TABS (HYDROCODONE-ACETAMINOPHEN) 1 tab @ bedtime * EQUATE NASAL SPRAY  CRESTOR 40 MG  TABS (ROSUVASTATIN CALCIUM) 1 tab @ bedtime   Anticoagulation Visit Questionnaire      Coumadin dose missed/changed:  No      Abnormal Bleeding Symptoms:  No   Any diet changes including alcohol intake, vegetables or greens since the  last visit:  No Any illnesses or hospitalizations since the last visit:  No Any signs of clotting since the last visit (including chest discomfort, dizziness, shortness of breath, arm tingling, slurred speech, swelling or redness in leg):  No   Appended Document: Orders Update    Clinical Lists Changes  Orders: Added new Service order of Est. Patient Level I (16109) - Signed Added new Service order of Protime ([id number]) - Signed Added new Service order of Fingerstick [phone]) - Signed

## 2010-03-18 NOTE — Assessment & Plan Note (Signed)
Summary: fu per doc/njr   Vital Signs:  Patient Profile:   70 Years Old Male Height:     71 inches Weight:      238 pounds Temp:     97.8 degrees F oral BP sitting:   140 / 86  (left arm) Cuff size:   regular  Vitals Entered By: Kern Reap CMA (March 03, 2008 2:36 PM)                 Chief Complaint:  follow up left leg.  History of Present Illness:  William Norman is a 70 year old male, who comes back today for follow-up of cellulitis of his left lower extremity.  He was seen last week had marked edema of his leg.  We start him on antibiotics, but we only saw marginal improvement.  We therefore, advised him this we start his Lasix.  He did not get the prescription filled until Sunday, and only took one dose.  However, he said it helped a lot with the swelling and now the redness is going but not gone.    Current Allergies: No known allergies   Past Medical History:    Reviewed history from 08/04/2006 and no changes required:       CAD       Hyperlipidemia       Hypertension       BPH       GERD       ECZEMA       PERIPHERAL EDEMA   Social History:    Reviewed history from 02/13/2007 and no changes required:       Retired       Married       Never Smoked     Physical Exam  General:     Well-developed,well-nourished,in no acute distress; alert,appropriate and cooperative throughout examination Skin:     the left lower extremity appears less red.  The central lesion shows no past.  There is still one to 2+ edema    Impression & Recommendations:  Problem # 1:  CELLULITIS AND ABSCESS OF OTHER SPECIFIED SITE [phone]) Assessment: Improved  His updated medication list for this problem includes:    Keflex 500 Mg Caps (Cephalexin) .Marland Kitchen... Take 2 tablet by mouth two times a day   Complete Medication List: 1)  Atenolol 100 Mg Tabs (Atenolol) .... Take 1 tablet by mouth once a day 2)  Coumadin 5 Mg Tabs (Warfarin sodium) .... Take 1 tablet by mouth once a  day 3)  Nexium 40 Mg Cpdr (Esomeprazole magnesium) .... Take 1 tab by mouth at bedtime 4)  Niaspan 500 Mg Tbcr (Niacin (antihyperlipidemic)) .... Take 1 tablet by mouth once a day 5)  Klor-con M20 20 Meq Tbcr (Potassium chloride crys cr) .... Take 1 tablet by mouth once a day 6)  Lisinopril 20 Mg Tabs (Lisinopril) .... Take 1 tablet by mouth once a day 7)  Nitrostat 0.4 Mg Subl (Nitroglycerin) .... As needed,uad 8)  Viagra 50 Mg Tabs (Sildenafil citrate) .... As needed, uad 9)  Equate Nasal Spray  10)  Crestor 40 Mg Tabs (Rosuvastatin calcium) .Marland Kitchen.. 1 tab @ bedtime 11)  Keflex 500 Mg Caps (Cephalexin) .... Take 2 tablet by mouth two times a day 12)  Furosemide 20 Mg Tabs (Furosemide) .... Take 1 tablet by mouth two times a day   Patient Instructions: 1)  take 20 mg of Lasix twice a day.  We are not to be able to completely get the infection  resolved until we get the swelling to go away.  Return in one week for follow-up

## 2010-03-18 NOTE — Procedures (Signed)
Summary: Colonoscopy   Colonoscopy  Procedure date:  03/25/2008  Findings:      Location:  [facility].    Procedures Next Due Date:    Colonoscopy: 03/2018  COLONOSCOPY PROCEDURE REPORT  PATIENT:  William Norman, William Norman  MR#:  [id number] BIRTHDATE:   [birth year]   GENDER:   male  ENDOSCOPIST:   Barbette Hair. Arlyce Dice, MD Referred by: Eugenio Hoes Tawanna Cooler, M.D.  PROCEDURE DATE:  03/25/2008 PROCEDURE:  Average-risk screening colonoscopy ASA CLASS:   Class II INDICATIONS: screening   MEDICATIONS:    Fentanyl 75 mcg, Versed mg  DESCRIPTION OF PROCEDURE:   After the risks benefits and alternatives of the procedure were thoroughly explained, informed consent was obtained.  Digital rectal exam was performed and revealed no abnormalities.   The LB [id number] endoscope was introduced through the anus and advanced to the cecum, which was identified by both the appendix and ileocecal valve, without limitations.  The quality of the prep was excellent, using MoviPrep.  The instrument was then slowly withdrawn as the colon was fully examined. <<PROCEDUREIMAGES>>              <<OLD IMAGES>>  FINDINGS:  Mild diverticulosis was found in the right colon (see image6).  Mild diverticulosis was found in the sigmoid colon (see image9).  Internal hemorrhoids were found (see image11).  This was otherwise a normal examination (see image3, image4, image5, image8, and image10).   Retroflexed views in the rectum revealed no abnormalities.    The scope was then withdrawn from the patient and the procedure completed.  COMPLICATIONS:   None  ENDOSCOPIC IMPRESSION:  1) Mild diverticulosis in the right colon  2) Mild diverticulosis in the sigmoid colon  3) Internal hemorrhoids  4) Otherwise normal examination RECOMMENDATIONS:  1) continue current medications    2) You should continue follow current colorectal cancer screening guidelines for "routine risk" patients with a repeat colonoscopy in  10 years. I do not recommend other colon cancer screening prior to then (including stool tests for microscopic blood) unless new symptoms arise.      Resume coumadin  REPEAT EXAM:   In 10 year(s) for Colonoscopy.   _______________________________ Barbette Hair. Arlyce Dice, MD  CC: Eugenio Hoes. Tawanna Cooler, MD

## 2010-03-18 NOTE — Miscellaneous (Signed)
Summary: [facility] previsit/prep  Clinical Lists Changes  Medications: Added new medication of MOVIPREP 100 GM  SOLR (PEG-KCL-NACL-NASULF-NA ASC-C) As per prep instructions. - Signed Rx of MOVIPREP 100 GM  SOLR (PEG-KCL-NACL-NASULF-NA ASC-C) As per prep instructions.;  #1 x 0;  Signed;  Entered by: Wyona Almas RN;  Authorized by: Meryl Dare MD [facility];  Method used: [facility] to [facility]  [phone]*, [address] [place], [place]  [address], Ph: [phone] or [phone], Fax: [phone] Observations: Added new observation of NKA: T (12/12/2008 15:32)    Prescriptions: MOVIPREP 100 GM  SOLR (PEG-KCL-NACL-NASULF-NA ASC-C) As per prep instructions.  #1 x 0   Entered by:   Wyona Almas RN   Authorized by:   Meryl Dare MD [facility]   Signed by:   Wyona Almas RN on 12/12/2008   Method used:   [facility]ronically to        [facility]  [phone]* (retail)       [address] [place], [place]  [address]       Ph: [phone] or [phone]       Fax: [phone]   RxID:   [id number]

## 2010-03-18 NOTE — Progress Notes (Signed)
Summary: comadin refill  Phone Note Refill Request   Refills Requested: Medication #1:  COUMADIN 5 MG  TABS Take 1 tablet by mouth once a day Initial call taken by: Kern Reap CMA Duncan Dull),  December 02, 2009 9:47 AM    New/Updated Medications: COUMADIN 5 MG  TABS (WARFARIN SODIUM) Take 1 1/2 tab T,W,F,Sat,Sun 1 tab M,Th Prescriptions: COUMADIN 5 MG  TABS (WARFARIN SODIUM) Take 1 1/2 tab T,W,F,Sat,Sun 1 tab M,Th  #90 x 3   Entered by:   Kern Reap CMA (AAMA)   Authorized by:   Roderick Pee MD   Signed by:   Kern Reap CMA (AAMA) on 12/02/2009   Method used:   Electronically to        [facility]  [phone]* (retail)       [address] [place], [place]  [address]       Ph: [phone] or [phone]       Fax: [phone]   RxID:   [id number]

## 2010-03-18 NOTE — Assessment & Plan Note (Signed)
Summary: 2 wk rov/mm   Vital Signs:  Patient Profile:   70 Years Old Male Height:     71 inches Weight:      235 pounds Temp:     98.3 degrees F oral BP sitting:   110 / 80  (left arm) Cuff size:   regular  Vitals Entered By: Kern Reap CMA (March 24, 2008 11:46 AM)                 PCP:  Kelle Darting M.D  Chief Complaint:  follow up left leg .  History of Present Illness: William Norman is a 70 year old male, who comes in today for follow-up of venous insufficiency of his left lower extremity.  He had a superficial cellulitis that we treat her with Keflex 1 g b.i.d.  The redness and mostly gone.  However, the swelling is still there.  We increased his Lasix to 20 mg b.i.d.  He's lost 3 pounds in one week.  All fluid.  Leg is better, but still swollen    Prior Medication List:  ATENOLOL 100 MG  TABS (ATENOLOL) Take 1 tablet by mouth once a day COUMADIN 5 MG  TABS (WARFARIN SODIUM) Take 1 tablet by mouth once a day NEXIUM 40 MG  CPDR (ESOMEPRAZOLE MAGNESIUM) Take 1 tab by mouth at bedtime NIASPAN 500 MG  TBCR (NIACIN (ANTIHYPERLIPIDEMIC)) Take 1 tablet by mouth once a day KLOR-CON M20 20 MEQ  TBCR (POTASSIUM CHLORIDE CRYS CR) Take 1 tablet by mouth once a day LISINOPRIL 20 MG  TABS (LISINOPRIL) Take 1 tablet by mouth once a day NITROSTAT 0.4 MG  SUBL (NITROGLYCERIN) as needed,uad VIAGRA 50 MG  TABS (SILDENAFIL CITRATE) as needed, uad * EQUATE NASAL SPRAY  CRESTOR 40 MG  TABS (ROSUVASTATIN CALCIUM) 1 tab @ bedtime FUROSEMIDE 20 MG TABS (FUROSEMIDE) Take 1 tablet by mouth two times a day MIRALAX   POWD (POLYETHYLENE GLYCOL 3350) As per prep  instructions. REGLAN 10 MG  TABS (METOCLOPRAMIDE HCL) As per prep instructions. DULCOLAX 5 MG  TBEC (BISACODYL) Day before procedure take 2 at 3pm and 2 at 8pm.   Current Allergies (reviewed today): No known allergies   Past Medical History:    Reviewed history from 08/04/2006 and no changes required:       CAD        Hyperlipidemia       Hypertension       BPH       GERD       ECZEMA       PERIPHERAL EDEMA   Social History:    Reviewed history from 03/17/2008 and no changes required:       Retired       Married       Never Smoked       Alcohol Use - yes rare       Daily Caffeine Use       Illicit Drug Use - no       Patient gets regular exercise.    Review of Systems      See HPI   Physical Exam  General:     Well-developed,well-nourished,in no acute distress; alert,appropriate and cooperative throughout examination Extremities:     2+ left pedal edema and 2+ right pedal edema.   Skin:     the skin is well healed.  It slightly erythematosus, and tender.    Impression & Recommendations:  Problem # 1:  PERIPHERAL EDEMA (ICD-782.3) Assessment: Improved  His updated  medication list for this problem includes:    Furosemide 20 Mg Tabs (Furosemide) .Marland Kitchen... 2 in am, 1 @ noon  Orders: Venipuncture (04540) TLB-BMP (Basic Metabolic Panel-BMET) (80048-METABOL)   Complete Medication List: 1)  Atenolol 100 Mg Tabs (Atenolol) .... Take 1 tablet by mouth once a day 2)  Coumadin 5 Mg Tabs (Warfarin sodium) .... Take 1 tablet by mouth once a day 3)  Nexium 40 Mg Cpdr (Esomeprazole magnesium) .... Take 1 tab by mouth at bedtime 4)  Niaspan 500 Mg Tbcr (Niacin (antihyperlipidemic)) .... Take 1 tablet by mouth once a day 5)  Klor-con M20 20 Meq Tbcr (Potassium chloride crys cr) .... Take 1 tablet by mouth once a day 6)  Lisinopril 20 Mg Tabs (Lisinopril) .... Take 1 tablet by mouth once a day 7)  Nitrostat 0.4 Mg Subl (Nitroglycerin) .... As needed,uad 8)  Viagra 50 Mg Tabs (Sildenafil citrate) .... As needed, uad 9)  Equate Nasal Spray  10)  Crestor 40 Mg Tabs (Rosuvastatin calcium) .Marland Kitchen.. 1 tab @ bedtime 11)  Furosemide 20 Mg Tabs (Furosemide) .... 2 in am, 1 @ noon 12)  Miralax Powd (Polyethylene glycol 3350) .... As per prep  instructions. 13)  Reglan 10 Mg Tabs (Metoclopramide hcl) ....  As per prep instructions. 14)  Dulcolax 5 Mg Tbec (Bisacodyl) .... Day before procedure take 2 at 3pm and 2 at 8pm.   Patient Instructions: 1)  increase the Lasix to take 40 mg in the morning and 20 mg at noon.  We will check a potassium level today.   Prescriptions: FUROSEMIDE 20 MG TABS (FUROSEMIDE) 2 in am, 1 @ noon  #300 x 3   Entered and Authorized by:   Roderick Pee MD   Signed by:   Roderick Pee MD on 03/24/2008   Method used:   Electronically to        [facility]  [phone]* (retail)       [address] [place], [place]  [address]       Ph: [phone] or [phone]       Fax: [phone]   RxID:   [id number]

## 2010-03-18 NOTE — Assessment & Plan Note (Signed)
Summary: PT..LH  Nurse Visit   Allergies: No Known Drug Allergies Laboratory Results   Blood Tests     PT: 17.8 s   (Normal Range: 10.6-13.4)  INR: 2.1   (Normal Range: 0.88-1.12   Therap INR: 2.0-3.5) Comments: Rita Ohara  January 12, 2009 3:23 PM     Orders Added: 1)  Est. Patient Level I [99211] 2)  Protime [[id number]]   ANTICOAGULATION RECORD PREVIOUS REGIMEN & LAB RESULTS Anticoagulation Diagnosis:  v58.83,v58.61, 453.41 on  02/13/2007 Previous INR Goal Range:  2-3 on  08/09/2007 Previous INR:  1.3 on  08/22/2008 Previous Coumadin Dose(mg):  5mg  QD on  08/09/2007 Previous Regimen:  same on  08/09/2007 Previous Coagulation Comments:  per dr.k leave the same.  on  08/22/2008  NEW REGIMEN & LAB RESULTS Current INR: 2.1 Regimen: same  Repeat testing in: 1 month  Anticoagulation Visit Questionnaire Coumadin dose missed/changed:  No Abnormal Bleeding Symptoms:  No  Any diet changes including alcohol intake, vegetables or greens since the last visit:  No Any illnesses or hospitalizations since the last visit:  Yes      Recent Illness/Hospitalizations:  June 24th for 3 weeks for a ruptured colin. Any signs of clotting since the last visit (including chest discomfort, dizziness, shortness of breath, arm tingling, slurred speech, swelling or redness in leg):  No  MEDICATIONS ATENOLOL 100 MG  TABS (ATENOLOL) Take 1 tablet by mouth once a day COUMADIN 5 MG  TABS (WARFARIN SODIUM) Take 1 tablet by mouth once a day NEXIUM 40 MG  CPDR (ESOMEPRAZOLE MAGNESIUM) Take 1 tab by mouth at bedtime NIASPAN 500 MG  TBCR (NIACIN (ANTIHYPERLIPIDEMIC)) Take 1 tablet by mouth once a day KLOR-CON M20 20 MEQ  TBCR (POTASSIUM CHLORIDE CRYS CR) Take 1 tablet by mouth once a day LISINOPRIL 20 MG  TABS (LISINOPRIL) Take 1 tablet by mouth once a day NITROSTAT 0.4 MG  SUBL (NITROGLYCERIN) as needed,uad VIAGRA 50 MG  TABS (SILDENAFIL CITRATE) as needed, uad * EQUATE NASAL SPRAY  CRESTOR  40 MG  TABS (ROSUVASTATIN CALCIUM) 1 tab @ bedtime FUROSEMIDE 20 MG TABS (FUROSEMIDE) 2 in am, 1 @ noon

## 2010-03-18 NOTE — Assessment & Plan Note (Signed)
Summary: CPX//PT WILL BE FASTING//ALP   Vital Signs:  Patient profile:   70 year old male Height:      71 inches Weight:      231 pounds BMI:     32.33 Temp:     97.9 degrees F oral BP sitting:   128 / 80  (left arm) Cuff size:   large  Vitals Entered By: Romualdo Bolk, CMA (AAMA) (February 26, 2010 9:49 AM) CC: Annual Visit for disease management   Primary Care Provider:  Kelle Darting M.D  CC:  Annual Visit for disease management.  History of Present Illness:  William Norman is a 70 year old male, who comes in today for Medicare wellness examination because of a history of underlying hypertension erectile dysfunction, coronary disease hyperlipidemia.  His medications are reviewed.  There is been no changes except he stopped taking the potassium and the Viagra 50 mg does not work.  Also, the nitroglycerin he has not had to use cardiac wise.  He said no chest pain.  He gets routine eye care, dental care, colonoscopy, normal in GI......... he did have a perforation from his previous colonoscopy and had to have a colostomy which has been reversed......... tetanus 2006, Pneumovax 2010, seasonal flu shot 2011, information given on shingles  His gastroenterologist, Dr. Russella Dar has him on Nexium.  He tried OTC Prilosec, and he did work. Here for Medicare AWV:  1.   Risk factors based on Past M, S, F history:...reviewed no changes 2.   Physical Activities: walks daily 3.   Depression/mood: good mood.  No depression 4.   Hearing: normal.  Hearing 5.   ADL's: functions independently 6.   Fall Risk: reviewed.  None identified 7.   Home Safety: guns in the house locked up 8.   Height, weight, &visual acuity:height weight, normal.  Vision normal except for early cataract 9.   Counseling: ..continue good health habits 10.   Labs ordered based on risk factors:done today 11.           Referral Coordination...Marland KitchenMarland KitchenMarland Kitchennone indicated 12.           Care Plan....are reviewed 95.            Cognitive  Assessment ...... oriented x 3.  He does all his own finances independently  Current Medications (verified): 1)  Atenolol 100 Mg  Tabs (Atenolol) .... Take 1 Tablet By Mouth Once A Day 2)  Coumadin 5 Mg  Tabs (Warfarin Sodium) .... Take 1 1/2 Tab T,w,f,sat,sun 1 Tab M,th 3)  Klor-Con M20 20 Meq  Tbcr (Potassium Chloride Crys Cr) .... Take 1 Tablet By Mouth Once A Day 4)  Lisinopril 20 Mg  Tabs (Lisinopril) .... Take 1 Tablet By Mouth Once A Day 5)  Nitrostat 0.4 Mg  Subl (Nitroglycerin) .... As Needed,uad 6)  Viagra 50 Mg  Tabs (Sildenafil Citrate) .... As Needed, Uad 7)  Equate Nasal Spray 8)  Prilosec 40 Mg Cpdr (Omeprazole) .... Take 1 Tablet By Mouth Every Morning 9)  Niacin Cr 250 Mg Cr-Caps (Niacin)  Allergies (verified): No Known Drug Allergies  Past History:  Past medical, surgical, family and social histories (including risk factors) reviewed, and no changes noted (except as noted below).  Past Medical History: Reviewed history from 08/04/2006 and no changes required. CAD Hyperlipidemia Hypertension BPH GERD ECZEMA PERIPHERAL EDEMA  Past Surgical History: Reviewed history from 08/04/2006 and no changes required. Percutaneous transluminal coronary angioplasty T&A R HERNIA  DVT R LEG  Family History: Reviewed history  from 03/17/2008 and no changes required. No FH of Colon Cancer: Family History of Heart Disease: father  Social History: Reviewed history from 03/17/2008 and no changes required. Retired Married Never Smoked Alcohol Use - yes rare Daily Caffeine Use Illicit Drug Use - no Patient gets regular exercise.  Review of Systems      See HPI  Physical Exam  General:  Well-developed,well-nourished,in no acute distress; alert,appropriate and cooperative throughout examination Head:  Normocephalic and atraumatic without obvious abnormalities. No apparent alopecia or balding. Eyes:  No corneal or conjunctival inflammation noted. EOMI. Perrla.  Funduscopic exam benign, without hemorrhages, exudates or papilledema. Vision grossly normal. Ears:  External ear exam shows no significant lesions or deformities.  Otoscopic examination reveals clear canals, tympanic membranes are intact bilaterally without bulging, retraction, inflammation or discharge. Hearing is grossly normal bilaterally. Nose:  External nasal examination shows no deformity or inflammation. Nasal mucosa are pink and moist without lesions or exudates. Mouth:  Oral mucosa and oropharynx without lesions or exudates.  Teeth in good repair. Neck:  No deformities, masses, or tenderness noted. Chest Wall:  No deformities, masses, tenderness or gynecomastia noted. Breasts:  No masses or gynecomastia noted Lungs:  Normal respiratory effort, chest expands symmetrically. Lungs are clear to auscultation, no crackles or wheezes. Heart:  Normal rate and regular rhythm. S1 and S2 normal without gallop, murmur, click, rub or other extra sounds. Abdomen:  Bowel sounds positive,abdomen soft and non-tender without masses, organomegaly or hernias noted. Rectal:  No external abnormalities noted. Normal sphincter tone. No rectal masses or tenderness. Genitalia:  Testes bilaterally descended without nodularity, tenderness or masses. No scrotal masses or lesions. No penis lesions or urethral discharge. Prostate:  no nodules, no asymmetry, and 1+ enlarged.   Msk:  No deformity or scoliosis noted of thoracic or lumbar spine.   Pulses:  R and L carotid,radial,femoral,dorsalis pedis and posterior tibial pulses are full and equal bilaterally Neurologic:  No cranial nerve deficits noted. Station and gait are normal. Plantar reflexes are down-going bilaterally. DTRs are symmetrical throughout. Sensory, motor and coordinative functions appear intact.   Impression & Recommendations:  Problem # 1:  CAD (ICD-414.00) Assessment Unchanged  His updated medication list for this problem includes:    Atenolol  100 Mg Tabs (Atenolol) .Marland Kitchen... Take 1 tablet by mouth once a day    Lisinopril 20 Mg Tabs (Lisinopril) .Marland Kitchen... Take 1 tablet by mouth once a day    Nitrostat 0.4 Mg Subl (Nitroglycerin) .Marland Kitchen... As needed,uad  Orders: Venipuncture (16109) TLB-Lipid Panel (80061-LIPID) TLB-BMP (Basic Metabolic Panel-BMET) (80048-METABOL) TLB-CBC Platelet - w/Differential (85025-CBCD) TLB-Hepatic/Liver Function Pnl (80076-HEPATIC) TLB-TSH (Thyroid Stimulating Hormone) (84443-TSH) TLB-PSA (Prostate Specific Antigen) (84153-PSA) Prescription Created Electronically [phone]) Medicare -1st Annual Wellness Visit [phone]) Urinalysis-dipstick only (Medicare patient) ([id number])  Problem # 2:  HYPERTENSION (ICD-401.9) Assessment: Improved  His updated medication list for this problem includes:    Atenolol 100 Mg Tabs (Atenolol) .Marland Kitchen... Take 1 tablet by mouth once a day    Lisinopril 20 Mg Tabs (Lisinopril) .Marland Kitchen... Take 1 tablet by mouth once a day  Orders: Venipuncture (56213) TLB-Lipid Panel (80061-LIPID) TLB-BMP (Basic Metabolic Panel-BMET) (80048-METABOL) TLB-CBC Platelet - w/Differential (85025-CBCD) TLB-Hepatic/Liver Function Pnl (80076-HEPATIC) TLB-TSH (Thyroid Stimulating Hormone) (84443-TSH) TLB-PSA (Prostate Specific Antigen) (84153-PSA) Prescription Created Electronically [phone]) Medicare -1st Annual Wellness Visit [phone]) Urinalysis-dipstick only (Medicare patient) ([id number])  Problem # 3:  HYPERLIPIDEMIA (ICD-272.4) Assessment: Improved  The following medications were removed from the medication list:    Niaspan 500 Mg Tbcr (Niacin (antihyperlipidemic)) .Marland KitchenMarland KitchenMarland KitchenMarland Kitchen  Take 1 tablet by mouth once a day    Niaspan 1000 Mg Cr-tabs (Niacin (antihyperlipidemic)) .Marland Kitchen... Take 1 tablet by mouth every morning His updated medication list for this problem includes:    Niacin Cr 250 Mg Cr-caps (Niacin)  Orders: Venipuncture (04540) TLB-Lipid Panel (80061-LIPID) TLB-BMP (Basic Metabolic Panel-BMET) (80048-METABOL) TLB-CBC  Platelet - w/Differential (85025-CBCD) TLB-Hepatic/Liver Function Pnl (80076-HEPATIC) TLB-TSH (Thyroid Stimulating Hormone) (84443-TSH) TLB-PSA (Prostate Specific Antigen) (98119-JYN) Prescription Created Electronically [phone]) Medicare -1st Annual Wellness Visit [phone]) Urinalysis-dipstick only (Medicare patient) (65784ON)  Problem # 4:  Preventive Health Care (ICD-V70.0) Assessment: Unchanged  Complete Medication List: 1)  Atenolol 100 Mg Tabs (Atenolol) .... Take 1 tablet by mouth once a day 2)  Coumadin 5 Mg Tabs (Warfarin sodium) .... Take 1 1/2 tab t,w,f,sat,sun 1 tab m,th 3)  Klor-con M20 20 Meq Tbcr (Potassium chloride crys cr) .... Take 1 tablet by mouth once a day 4)  Lisinopril 20 Mg Tabs (Lisinopril) .... Take 1 tablet by mouth once a day 5)  Nitrostat 0.4 Mg Subl (Nitroglycerin) .... As needed,uad 6)  Equate Nasal Spray  7)  Prilosec 40 Mg Cpdr (Omeprazole) .... Take 1 tablet by mouth every morning 8)  Niacin Cr 250 Mg Cr-caps (Niacin) 9)  Viagra 100 Mg Tabs (Sildenafil citrate) .... Uad 10)  Nexium 40 Mg Cpdr (Esomeprazole magnesium) .... Take 1 tablet by mouth every morning  Other Orders: Specimen Handling (62952)  Patient Instructions: 1)  Please schedule a follow-up appointment in 1 year. 2)  It is important that you exercise regularly at least 20 minutes 5 times a week. If you develop chest pain, have severe difficulty breathing, or feel very tired , stop exercising immediately and seek medical attention. 3)  You need to lose weight. Consider a lower calorie diet and regular exercise.  Prescriptions: NEXIUM 40 MG CPDR (ESOMEPRAZOLE MAGNESIUM) Take 1 tablet by mouth every morning  #100 x 3   Entered and Authorized by:   Roderick Pee MD   Signed by:   Roderick Pee MD on 02/26/2010   Method used:   Electronically to        [facility]  [phone]* (retail)       [address] [place], [place]  [address]       Ph: [phone] or [phone]        Fax: [phone]   RxID:   [id number] VIAGRA 100 MG TABS (SILDENAFIL CITRATE) UAD  #6 x 11   Entered and Authorized by:   Roderick Pee MD   Signed by:   Roderick Pee MD on 02/26/2010   Method used:   Electronically to        [facility]  [phone]* (retail)       [address] [place], [place]  [address]       Ph: [phone] or [phone]       Fax: [phone]   RxID:   [id number] NITROSTAT 0.4 MG  SUBL (NITROGLYCERIN) as needed,uad  #25 x 1   Entered and Authorized by:   Roderick Pee MD   Signed by:   Roderick Pee MD on 02/26/2010   Method used:   Electronically to        [facility]  [phone]* (retail)       [address] [place], [place]  [address]  Ph: [phone] or [phone]       Fax: [phone]   RxID:   [id number] LISINOPRIL 20 MG  TABS (LISINOPRIL) Take 1 tablet by mouth once a day  #100 Tablet x 3   Entered and Authorized by:   Roderick Pee MD   Signed by:   Roderick Pee MD on 02/26/2010   Method used:   Electronically to        [facility]  [phone]* (retail)       [address] [place], [place]  [address]       Ph: [phone] or [phone]       Fax: [phone]   RxID:   [id number] COUMADIN 5 MG  TABS (WARFARIN SODIUM) Take 1 1/2 tab T,W,F,Sat,Sun 1 tab M,Th  #100 x 3   Entered and Authorized by:   Roderick Pee MD   Signed by:   Roderick Pee MD on 02/26/2010   Method used:   Electronically to        [facility]  [phone]* (retail)       [address] [place], [place]  [address]       Ph: [phone] or [phone]       Fax: [phone]   RxID:   [id number] ATENOLOL 100 MG  TABS (ATENOLOL) Take 1 tablet by mouth once a day  #100 Tablet x 3   Entered and Authorized by:   Roderick Pee MD   Signed by:   Roderick Pee MD on 02/26/2010   Method used:   Electronically to        [facility]  [phone]* (retail)       [address] [place], [place]  [address]       Ph: [phone] or [phone]       Fax: [phone]   RxID:   [id number]    Orders Added: 1)  Venipuncture [85027] 2)  TLB-Lipid Panel [80061-LIPID] 3)  TLB-BMP (Basic Metabolic Panel-BMET) [80048-METABOL] 4)  TLB-CBC Platelet - w/Differential [85025-CBCD] 5)  TLB-Hepatic/Liver Function Pnl [80076-HEPATIC] 6)  TLB-TSH (Thyroid Stimulating Hormone) [84443-TSH] 7)  TLB-PSA (Prostate Specific Antigen) [74128-NOM] 8)  Prescription Created Electronically [G8553] 9)  Medicare -1st Annual Wellness Visit [G0438] 10)  Urinalysis-dipstick only (Medicare patient) [81003QW] 11)  Specimen Handling [99000]   Immunization History:  Influenza Immunization History:    Influenza:  historical (11/14/2009)   Immunization History:  Influenza Immunization History:    Influenza:  Historical (11/14/2009)   ANTICOAGULATION RECORD PREVIOUS REGIMEN & LAB RESULTS Anticoagulation Diagnosis:  v58.83,v58.61, 453.41 on  02/13/2007 Previous INR Goal Range:  2-3 on  08/09/2007 Previous INR:  3.9 on  12/24/2009 Previous Coumadin Dose(mg):  5mg  on mon & thu 7.5mg  on other days on  12/24/2009 Previous Regimen:  Hold for a day then 5mg  qd on  12/24/2009 Previous Coagulation Comments:  per dr.k leave the same.  on  08/22/2008  NEW REGIMEN & LAB RESULTS Current INR: 1.9 Regimen: same  Repeat testing in: 4 weeks  Anticoagulation Visit Questionnaire Coumadin dose missed/changed:  No Abnormal Bleeding Symptoms:  No  Any diet changes including alcohol intake, vegetables or greens since the last visit:  No Any illnesses or hospitalizations since the last visit:  No Any signs of clotting since the last visit (including chest discomfort, dizziness, shortness of breath, arm tingling, slurred speech, swelling or  redness in leg):  Yes  MEDICATIONS ATENOLOL 100 MG  TABS (ATENOLOL) Take 1 tablet by mouth once a day COUMADIN 5 MG  TABS (WARFARIN SODIUM) Take 1 1/2 tab  T,W,F,Sat,Sun 1 tab M,Th KLOR-CON M20 20 MEQ  TBCR (POTASSIUM CHLORIDE CRYS CR) Take 1 tablet by mouth once a day LISINOPRIL 20 MG  TABS (LISINOPRIL) Take 1 tablet by mouth once a day NITROSTAT 0.4 MG  SUBL (NITROGLYCERIN) as needed,uad * EQUATE NASAL SPRAY  PRILOSEC 40 MG CPDR (OMEPRAZOLE) Take 1 tablet by mouth every morning NIACIN CR 250 MG CR-CAPS (NIACIN)  VIAGRA 100 MG TABS (SILDENAFIL CITRATE) UAD NEXIUM 40 MG CPDR (ESOMEPRAZOLE MAGNESIUM) Take 1 tablet by mouth every morning    Laboratory Results   Blood Tests      INR: 1.9   (Normal Range: 0.88-1.12   Therap INR: 2.0-3.5) Comments: Rita Ohara  February 26, 2010 11:06 AM       Appended Document: Orders Update    Clinical Lists Changes  Observations: Added new observation of COMMENTS: Rita Ohara  February 26, 2010 1:44 PM  (02/26/2010 13:44) Added new observation of PH URINE: 7.0  (02/26/2010 13:44) Added new observation of SPEC GR URIN: 1.020  (02/26/2010 13:44) Added new observation of APPEARANCE U: Clear  (02/26/2010 13:44) Added new observation of UA COLOR: yellow  (02/26/2010 13:44) Added new observation of WBC DIPSTK U: negative  (02/26/2010 13:44) Added new observation of NITRITE URN: negative  (02/26/2010 13:44) Added new observation of UROBILINOGEN: 0.2  (02/26/2010 13:44) Added new observation of PROTEIN, URN: negative  (02/26/2010 13:44) Added new observation of BLOOD UR DIP: trace-intact  (02/26/2010 13:44) Added new observation of KETONES URN: negative  (02/26/2010 13:44) Added new observation of BILIRUBIN UR: negative  (02/26/2010 13:44) Added new observation of GLUCOSE, URN: negative  (02/26/2010 13:44)      Laboratory Results   Urine Tests    Routine Urinalysis   Color: yellow Appearance: Clear Glucose: negative   (Normal Range: Negative) Bilirubin: negative   (Normal Range: Negative) Ketone: negative   (Normal Range: Negative) Spec. Gravity: 1.020   (Normal Range:  1.003-1.035) Blood: trace-intact   (Normal Range: Negative) pH: 7.0   (Normal Range: 5.0-8.0) Protein: negative   (Normal Range: Negative) Urobilinogen: 0.2   (Normal Range: 0-1) Nitrite: negative   (Normal Range: Negative) Leukocyte Esterace: negative   (Normal Range: Negative)    Comments: Rita Ohara  February 26, 2010 1:44 PM

## 2010-03-18 NOTE — Letter (Signed)
Summary: [facility]   Imported By: Sherian Rein 03/04/2009 12:25:28  _____________________________________________________________________  External Attachment:    Type:   Image     Comment:   External Document

## 2010-03-18 NOTE — Assessment & Plan Note (Signed)
Summary: pt will come in fasting/njr   Vital Signs:  Patient Profile:   70 Years Old Male Height:     71 inches Weight:      238 pounds Temp:     97.9 degrees F oral Pulse rate:   60 / minute Pulse rhythm:   regular BP sitting:   150 / 84  (right arm) Cuff size:   regular  Vitals Entered By: Kern Reap CMA (February 18, 2008 9:52 AM)                 Chief Complaint:  cpx.  History of Present Illness: William Norman is a 70 year old male, who comes in today for evaluation of multiple problems.  He has underlying hypertension, treated with atenolol hundred milligrams daily, potassium 20 mEq daily, lisinopril, 20 mEq daily.  BP 150/84.  Asked him to do a BP check every day for 3 weeks and get back if BP not at goal.  He also takes Nexium 40 mg daily for reflux esophagitis.  He is asymptomaes Coumadin 5 mg daily last protime was two months ago.  Advised to get a protime monthly.  He has underlying coronary disease.  We will write him a new bottle of Nitrostat.  Cardiac-wise he is asymptomatic.  He also uses Allegra 50 mg p.r.n. for rectal dysfunction.  No side effects  Yours uses Crestor 40 mg one half tablet nightly for hyperlipidemia.  Overall, he says he feels well and has no major complaints.  Last tetanus booster 2006, pneumonia shot 2005, seasonal flu 2009.  Advised H1 and one, and another Pneumovax    Updated Prior Medication List: ATENOLOL 100 MG  TABS (ATENOLOL) Take 1 tablet by mouth once a day COUMADIN 5 MG  TABS (WARFARIN SODIUM) Take 1 tablet by mouth once a day NEXIUM 40 MG  CPDR (ESOMEPRAZOLE MAGNESIUM) Take 1 tab by mouth at bedtime NIASPAN 500 MG  TBCR (NIACIN (ANTIHYPERLIPIDEMIC)) Take 1 tablet by mouth once a day KLOR-CON M20 20 MEQ  TBCR (POTASSIUM CHLORIDE CRYS CR) Take 1 tablet by mouth once a day LISINOPRIL 20 MG  TABS (LISINOPRIL) Take 1 tablet by mouth once a day NITROSTAT 0.4 MG  SUBL (NITROGLYCERIN) as needed,uad VIAGRA 50 MG  TABS (SILDENAFIL  CITRATE) as needed, uad * EQUATE NASAL SPRAY  CRESTOR 40 MG  TABS (ROSUVASTATIN CALCIUM) 1 tab @ bedtime  Current Allergies: No known allergies   Past Medical History:    Reviewed history from 08/04/2006 and no changes required:       CAD       Hyperlipidemia       Hypertension       BPH       GERD       ECZEMA       PERIPHERAL EDEMA   Social History:    Reviewed history from 02/13/2007 and no changes required:       Retired       Married       Never Smoked    Review of Systems      See HPI   Physical Exam  General:     Well-developed,well-nourished,in no acute distress; alert,appropriate and cooperative throughout examination Head:     Normocephalic and atraumatic without obvious abnormalities. No apparent alopecia or balding. Eyes:     No corneal or conjunctival inflammation noted. EOMI. Perrla. Funduscopic exam benign, without hemorrhages, exudates or papilledema. Vision grossly normal. Ears:     External ear exam shows no significant  lesions or deformities.  Otoscopic examination reveals clear canals, tympanic membranes are intact bilaterally without bulging, retraction, inflammation or discharge. Hearing is grossly normal bilaterally. Nose:     External nasal examination shows no deformity or inflammation. Nasal mucosa are pink and moist without lesions or exudates. Mouth:     Oral mucosa and oropharynx without lesions or exudates.  Teeth in good repair. Neck:     No deformities, masses, or tenderness noted. Chest Wall:     No deformities, masses, tenderness or gynecomastia noted. Breasts:     No masses or gynecomastia noted Lungs:     Normal respiratory effort, chest expands symmetrically. Lungs are clear to auscultation, no crackles or wheezes. Heart:     Normal rate and regular rhythm. S1 and S2 normal without gallop, murmur, click, rub or other extra sounds. Abdomen:     Bowel sounds positive,abdomen soft and non-tender without masses, organomegaly or  hernias noted. Rectal:     No external abnormalities noted. Normal sphincter tone. No rectal masses or tenderness. Genitalia:     Testes bilaterally descended without nodularity, tenderness or masses. No scrotal masses or lesions. No penis lesions or urethral discharge. Prostate:     Prostate gland firm and smooth, no enlargement, nodularity, tenderness, mass, asymmetry or induration. Msk:     No deformity or scoliosis noted of thoracic or lumbar spine.   Pulses:     R and L carotid,radial,femoral,dorsalis pedis and posterior tibial pulses are full and equal bilaterally Extremities:     No clubbing, cyanosis, edema, or deformity noted with normal full range of motion of all joints.   Neurologic:     No cranial nerve deficits noted. Station and gait are normal. Plantar reflexes are down-going bilaterally. DTRs are symmetrical throughout. Sensory, motor and coordinative functions appear intact. Skin:     small laceration sustained yesterday.  He he did local wound care, looks well.  The lower left extremity Cervical Nodes:     No lymphadenopathy noted Axillary Nodes:     No palpable lymphadenopathy Inguinal Nodes:     No significant adenopathy Psych:     Cognition and judgment appear intact. Alert and cooperative with normal attention span and concentration. No apparent delusions, illusions, hallucinations    Impression & Recommendations:  Problem # 1:  CAD (ICD-414.00) Assessment: Unchanged  The following medications were removed from the medication list:    Lasix 20 Mg Tabs (Furosemide) ..... Stopped  His updated medication list for this problem includes:    Atenolol 100 Mg Tabs (Atenolol) .Marland Kitchen... Take 1 tablet by mouth once a day    Lisinopril 20 Mg Tabs (Lisinopril) .Marland Kitchen... Take 1 tablet by mouth once a day    Nitrostat 0.4 Mg Subl (Nitroglycerin) .Marland Kitchen... As needed,uad  Orders: Venipuncture (16109) TLB-Lipid Panel (80061-LIPID) TLB-BMP (Basic Metabolic Panel-BMET)  (80048-METABOL) TLB-CBC Platelet - w/Differential (85025-CBCD) TLB-Hepatic/Liver Function Pnl (80076-HEPATIC) TLB-TSH (Thyroid Stimulating Hormone) (84443-TSH) TLB-PSA (Prostate Specific Antigen) (84153-PSA) Protime (60454UJ)   Problem # 2:  HYPERTENSION (ICD-401.9) Assessment: Unchanged  The following medications were removed from the medication list:    Lasix 20 Mg Tabs (Furosemide) ..... Stopped  His updated medication list for this problem includes:    Atenolol 100 Mg Tabs (Atenolol) .Marland Kitchen... Take 1 tablet by mouth once a day    Lisinopril 20 Mg Tabs (Lisinopril) .Marland Kitchen... Take 1 tablet by mouth once a day  Orders: Venipuncture (81191) TLB-Lipid Panel (80061-LIPID) TLB-BMP (Basic Metabolic Panel-BMET) (80048-METABOL) TLB-CBC Platelet - w/Differential (85025-CBCD) TLB-Hepatic/Liver Function Pnl (  80076-HEPATIC) TLB-TSH (Thyroid Stimulating Hormone) (84443-TSH) TLB-PSA (Prostate Specific Antigen) (84153-PSA) EKG w/ Interpretation (93000)   Problem # 3:  HYPERLIPIDEMIA (ICD-272.4) Assessment: Improved  His updated medication list for this problem includes:    Niaspan 500 Mg Tbcr (Niacin (antihyperlipidemic)) .Marland Kitchen... Take 1 tablet by mouth once a day    Crestor 40 Mg Tabs (Rosuvastatin calcium) .Marland Kitchen... 1 tab @ bedtime  Orders: Venipuncture (16109) TLB-Lipid Panel (80061-LIPID) TLB-BMP (Basic Metabolic Panel-BMET) (80048-METABOL) TLB-CBC Platelet - w/Differential (85025-CBCD) TLB-Hepatic/Liver Function Pnl (80076-HEPATIC) TLB-TSH (Thyroid Stimulating Hormone) (84443-TSH) TLB-PSA (Prostate Specific Antigen) (84153-PSA) EKG w/ Interpretation (93000)   Complete Medication List: 1)  Atenolol 100 Mg Tabs (Atenolol) .... Take 1 tablet by mouth once a day 2)  Coumadin 5 Mg Tabs (Warfarin sodium) .... Take 1 tablet by mouth once a day 3)  Nexium 40 Mg Cpdr (Esomeprazole magnesium) .... Take 1 tab by mouth at bedtime 4)  Niaspan 500 Mg Tbcr (Niacin (antihyperlipidemic)) .... Take 1  tablet by mouth once a day 5)  Klor-con M20 20 Meq Tbcr (Potassium chloride crys cr) .... Take 1 tablet by mouth once a day 6)  Lisinopril 20 Mg Tabs (Lisinopril) .... Take 1 tablet by mouth once a day 7)  Nitrostat 0.4 Mg Subl (Nitroglycerin) .... As needed,uad 8)  Viagra 50 Mg Tabs (Sildenafil citrate) .... As needed, uad 9)  Equate Nasal Spray  10)  Crestor 40 Mg Tabs (Rosuvastatin calcium) .Marland Kitchen.. 1 tab @ bedtime 11)  Keflex 500 Mg Caps (Cephalexin) .... Take 2 tablet by mouth two times a day  Other Orders: Pneumococcal Vaccine (60454) Admin 1st Vaccine (09811)   Patient Instructions: 1)  It is important that you exercise regularly at least 20 minutes 5 times a week. If you develop chest pain, have severe difficulty breathing, or feel very tired , stop exercising immediately and seek medical attention. 2)  You need to lose weight. Consider a lower calorie diet and regular exercise.  3)  Schedule a colonoscopy/sigmoidoscopy to help detect colon cancer. 4)  Check your Blood Pressure regularly. If it is above: you should make an appointment. 5)  clean the laceration with peroxide and apply ointment to and a Band-Aid daily.  If you see any signs of secondary infection.  Start Keflex immediately   Prescriptions: KEFLEX 500 MG CAPS (CEPHALEXIN) Take 2 tablet by mouth two times a day  #50 x 1   Entered and Authorized by:   Roderick Pee MD   Signed by:   Roderick Pee MD on 02/18/2008   Method used:   Print then Give to Patient   RxID:   [id number] CRESTOR 40 MG  TABS (ROSUVASTATIN CALCIUM) 1 tab @ bedtime  #100 x 4   Entered and Authorized by:   Roderick Pee MD   Signed by:   Roderick Pee MD on 02/18/2008   Method used:   Print then Give to Patient   RxID:   [id number] VIAGRA 50 MG  TABS (SILDENAFIL CITRATE) as needed, uad  #6 x 11   Entered and Authorized by:   Roderick Pee MD   Signed by:   Roderick Pee MD on 02/18/2008   Method used:   Print then Give to  Patient   RxID:   [id number] NITROSTAT 0.4 MG  SUBL (NITROGLYCERIN) as needed,uad  #25 x 1   Entered and Authorized by:   Roderick Pee MD   Signed by:   Roderick Pee MD on 02/18/2008  Method used:   Print then Give to Patient   RxID:   [id number] LISINOPRIL 20 MG  TABS (LISINOPRIL) Take 1 tablet by mouth once a day  #100 x 4   Entered and Authorized by:   Roderick Pee MD   Signed by:   Roderick Pee MD on 02/18/2008   Method used:   Print then Give to Patient   RxID:   [id number] KLOR-CON M20 20 MEQ  TBCR (POTASSIUM CHLORIDE CRYS CR) Take 1 tablet by mouth once a day  #100 x 4   Entered and Authorized by:   Roderick Pee MD   Signed by:   Roderick Pee MD on 02/18/2008   Method used:   Print then Give to Patient   RxID:   [id number] NIASPAN 500 MG  TBCR (NIACIN (ANTIHYPERLIPIDEMIC)) Take 1 tablet by mouth once a day  #100 x 4   Entered and Authorized by:   Roderick Pee MD   Signed by:   Roderick Pee MD on 02/18/2008   Method used:   Print then Give to Patient   RxID:   [id number] NEXIUM 40 MG  CPDR (ESOMEPRAZOLE MAGNESIUM) Take 1 tab by mouth at bedtime  #100 x 4   Entered and Authorized by:   Roderick Pee MD   Signed by:   Roderick Pee MD on 02/18/2008   Method used:   Print then Give to Patient   RxID:   [id number] COUMADIN 5 MG  TABS (WARFARIN SODIUM) Take 1 tablet by mouth once a day  #100 x 4   Entered and Authorized by:   Roderick Pee MD   Signed by:   Roderick Pee MD on 02/18/2008   Method used:   Print then Give to Patient   RxID:   [id number] ATENOLOL 100 MG  TABS (ATENOLOL) Take 1 tablet by mouth once a day  #100 x 4   Entered and Authorized by:   Roderick Pee MD   Signed by:   Roderick Pee MD on 02/18/2008   Method used:   Print then Give to Patient   RxID:   [id number]  ]  Pneumovax Vaccine    Vaccine Type: Pneumovax    Site: left deltoid    Mfr: Mer[employer]    Dose: 0.5 ml    Route: IM     Given by: Kern Reap CMA    Exp. Date: 03/06/2009    Lot #: [id number]  Laboratory Results   Blood Tests    Date/Time Reported: February 18, 2008 11:02 AM   PT: 22.0 s   (Normal Range: 10.6-13.4)  INR: 3.3   (Normal Range: 0.88-1.12   Therap INR: 2.0-3.5) Comments: Wynona Canes, CMA  February 18, 2008 11:02 AM        ANTICOAGULATION RECORD PREVIOUS REGIMEN & LAB RESULTS Anticoagulation Diagnosis:  v58.83,v58.61, 453.41 on  02/13/2007 Previous INR Goal Range:  2-3 on  08/09/2007 Previous INR:  2.0 on  11/12/2007 Previous Coumadin Dose(mg):  5mg  QD on  08/09/2007 Previous Regimen:  same on  08/09/2007 Previous Coagulation Comments:  Patient will stop eating greens. on  08/09/2007  NEW REGIMEN & LAB RESULTS Current INR: 3.3 Regimen: same  (no change)       Repeat testing in: 4 weeks MEDICATIONS ATENOLOL 100 MG  TABS (ATENOLOL) Take 1 tablet by mouth once a day COUMADIN 5 MG  TABS (WARFARIN SODIUM) Take 1 tablet by mouth  once a day NEXIUM 40 MG  CPDR (ESOMEPRAZOLE MAGNESIUM) Take 1 tab by mouth at bedtime NIASPAN 500 MG  TBCR (NIACIN (ANTIHYPERLIPIDEMIC)) Take 1 tablet by mouth once a day KLOR-CON M20 20 MEQ  TBCR (POTASSIUM CHLORIDE CRYS CR) Take 1 tablet by mouth once a day LISINOPRIL 20 MG  TABS (LISINOPRIL) Take 1 tablet by mouth once a day NITROSTAT 0.4 MG  SUBL (NITROGLYCERIN) as needed,uad VIAGRA 50 MG  TABS (SILDENAFIL CITRATE) as needed, uad * EQUATE NASAL SPRAY  CRESTOR 40 MG  TABS (ROSUVASTATIN CALCIUM) 1 tab @ bedtime KEFLEX 500 MG CAPS (CEPHALEXIN) Take 2 tablet by mouth two times a day   Anticoagulation Visit Questionnaire      Coumadin dose missed/changed:  No      Abnormal Bleeding Symptoms:  No   Any diet changes including alcohol intake, vegetables or greens since the last visit:  No Any illnesses or hospitalizations since the last visit:  No Any signs of clotting since the last visit (including chest discomfort, dizziness, shortness of breath,  arm tingling, slurred speech, swelling or redness in leg):  No

## 2010-03-18 NOTE — Assessment & Plan Note (Signed)
Summary: pt fasting/nta rsc bmp/njr   Vital Signs:  Patient Profile:   70 Years Old Male Weight:      225 pounds (102.27 kg) Temp:     98.1 degrees F (36.72 degrees C) oral Pulse rate:   62 / minute BP sitting:   145 / 76  Pt. in pain?   no  Vitals Entered By: Arcola Jansky, RN (February 13, 2007 9:39 AM)                  Chief Complaint:  cpx  and fasting labs .  History of Present Illness: William Norman is a 70 year old male, who comes in today for physical evaluation because of underlying coronary disease, hyperlipidemia, hypertension, BPH, reflux, esophagitis, and now severe back pain for 6 months.  Says overall he set a very good year.  Six months ago he noticed the gradual onset of back pain.  He describes it as right and he points to the lumbar area.  He says it comes and goes.  When it comes on a last for two to 3 minutesight knee, no history of trauma.  It also hurts when he coughs or sneezes.  It is never any injuries to his back.  Mostly scale review of systems negative.  He sucked on self minutes.  Activities.  Acute Visit History:      He denies abdominal pain, chest pain, constipation, cough, diarrhea, earache, eye symptoms, fever, genitourinary symptoms, headache, musculoskeletal symptoms, nasal discharge, nausea, rash, sinus problems, sore throat, and vomiting.         Current Allergies (reviewed today): No known allergies   Past Medical History:    Reviewed history from 08/04/2006 and no changes required:       CAD       Hyperlipidemia       Hypertension       BPH       GERD       ECZEMA       PERIPHERAL EDEMA   Family History:    Reviewed history and no changes required:  Social History:    Reviewed history and no changes required:       Retired       Married       Never Smoked   Risk Factors:  Tobacco use:  never   Review of Systems      See HPI   Physical Exam  General:     Well-developed,well-nourished,in no acute distress;  alert,appropriate and cooperative throughout examination Head:     Normocephalic and atraumatic without obvious abnormalities. No apparent alopecia or balding. Eyes:     No corneal or conjunctival inflammation noted. EOMI. Perrla. Funduscopic exam benign, without hemorrhages, exudates or papilledema. Vision grossly normal. Ears:     External ear exam shows no significant lesions or deformities.  Otoscopic examination reveals clear canals, tympanic membranes are intact bilaterally without bulging, retraction, inflammation or discharge. Hearing is grossly normal bilaterally. Nose:     External nasal examination shows no deformity or inflammation. Nasal mucosa are pink and moist without lesions or exudates. Mouth:     Oral mucosa and oropharynx without lesions or exudates.  Teeth in good repair. Neck:     No deformities, masses, or tenderness noted. Chest Wall:     No deformities, masses, tenderness or gynecomastia noted. Breasts:     No masses or gynecomastia noted Lungs:     Normal respiratory effort, chest expands symmetrically. Lungs are clear to auscultation, no  crackles or wheezes. Heart:     Normal rate and regular rhythm. S1 and S2 normal without gallop, murmur, click, rub or other extra sounds. Abdomen:     Bowel sounds positive,abdomen soft and non-tender without masses, organomegaly or hernias noted. Rectal:     No external abnormalities noted. Normal sphincter tone. No rectal masses or tenderness. Genitalia:     Testes bilaterally descended without nodularity, tenderness or masses. No scrotal masses or lesions. No penis lesions or urethral discharge. Prostate:     Prostate gland firm and smooth, no enlargement, nodularity, tenderness, mass, asymmetry or induration. Msk:     No deformity or scoliosis noted of thoracic or lumbar spine.   Pulses:     R and L carotid,radial,femoral,dorsalis pedis and posterior tibial pulses are full and equal bilaterally Extremities:     No  clubbing, cyanosis, edema, or deformity noted with normal full range of motion of all joints.   Neurologic:     No cranial nerve deficits noted. Station and gait are normal. Plantar reflexes are down-going bilaterally. DTRs are symmetrical throughout. Sensory, motor and coordinative functions appear intact.  Straight leg raising positive right leg at 45 degrees. Skin:     Intact without suspicious lesions or rashes Cervical Nodes:     No lymphadenopathy noted Axillary Nodes:     No palpable lymphadenopathy Inguinal Nodes:     No significant adenopathy Psych:     Cognition and judgment appear intact. Alert and cooperative with normal attention span and concentration. No apparent delusions, illusions, hallucinations    Impression & Recommendations:  Problem # 1:  HYPERTENSION (ICD-401.9) Assessment: Improved  His updated medication list for this problem includes:    Atenolol 100 Mg Tabs (Atenolol) .Marland Kitchen... Take 1 tablet by mouth once a day    Lasix 20 Mg Tabs (Furosemide) ..... Stopped    Lisinopril 20 Mg Tabs (Lisinopril) .Marland Kitchen... Take 1 tablet by mouth once a day  Orders: EKG w/ Interpretation (93000) Venipuncture (62952) UA Dipstick w/o Micro (81002) TLB-Lipid Panel (80061-LIPID) TLB-CBC Platelet - w/Differential (85025-CBCD) TLB-Hepatic/Liver Function Pnl (80076-HEPATIC) TLB-TSH (Thyroid Stimulating Hormone) (84443-TSH) TLB-BMP (Basic Metabolic Panel-BMET) (80048-METABOL) TLB-PSA (Prostate Specific Antigen) (84153-PSA)   Problem # 2:  HYPERLIPIDEMIA (ICD-272.4) Assessment: Improved  His updated medication list for this problem includes:    Niaspan 500 Mg Tbcr (Niacin (antihyperlipidemic)) .Marland Kitchen... Take 1 tablet by mouth once a day    Crestor 20 Mg Tabs (Rosuvastatin calcium) .Marland Kitchen... Take 1 tablet by mouth once a day  Orders: Venipuncture (84132) TLB-Lipid Panel (80061-LIPID) TLB-CBC Platelet - w/Differential (85025-CBCD) TLB-Hepatic/Liver Function Pnl  (80076-HEPATIC) TLB-TSH (Thyroid Stimulating Hormone) (84443-TSH) TLB-BMP (Basic Metabolic Panel-BMET) (80048-METABOL) TLB-PSA (Prostate Specific Antigen) (84153-PSA)   Problem # 3:  GERD (ICD-530.81) Assessment: Improved  His updated medication list for this problem includes:    Nexium 40 Mg Cpdr (Esomeprazole magnesium) .Marland Kitchen... Take 1 tab by mouth at bedtime  Orders: Venipuncture (44010) TLB-Lipid Panel (80061-LIPID) TLB-CBC Platelet - w/Differential (85025-CBCD) TLB-Hepatic/Liver Function Pnl (80076-HEPATIC) TLB-TSH (Thyroid Stimulating Hormone) (84443-TSH) TLB-BMP (Basic Metabolic Panel-BMET) (80048-METABOL) TLB-PSA (Prostate Specific Antigen) (84153-PSA)   Problem # 4:  CAD (ICD-414.00) Assessment: Unchanged  His updated medication list for this problem includes:    Atenolol 100 Mg Tabs (Atenolol) .Marland Kitchen... Take 1 tablet by mouth once a day    Lasix 20 Mg Tabs (Furosemide) ..... Stopped    Lisinopril 20 Mg Tabs (Lisinopril) .Marland Kitchen... Take 1 tablet by mouth once a day    Nitrostat 0.4 Mg Subl (Nitroglycerin) .Marland Kitchen... As  needed,uad  Orders: Venipuncture (84132) TLB-Lipid Panel (80061-LIPID) TLB-CBC Platelet - w/Differential (85025-CBCD) TLB-Hepatic/Liver Function Pnl (80076-HEPATIC) TLB-TSH (Thyroid Stimulating Hormone) (84443-TSH) TLB-BMP (Basic Metabolic Panel-BMET) (80048-METABOL) TLB-PSA (Prostate Specific Antigen) (84153-PSA)   Problem # 5:  DISC DISEASE, LUMBAR (ICD-722.52) Assessment: New  Orders: Venipuncture (44010) T-Lumbar Spine Complete, 5 Views (71110TC) TLB-Lipid Panel (80061-LIPID) TLB-CBC Platelet - w/Differential (85025-CBCD) TLB-Hepatic/Liver Function Pnl (80076-HEPATIC) TLB-TSH (Thyroid Stimulating Hormone) (84443-TSH) TLB-BMP (Basic Metabolic Panel-BMET) (80048-METABOL) TLB-PSA (Prostate Specific Antigen) (84153-PSA)   Complete Medication List: 1)  Atenolol 100 Mg Tabs (Atenolol) .... Take 1 tablet by mouth once a day 2)  Coumadin 5 Mg Tabs (Warfarin  sodium) .... Take 1 tablet by mouth once a day 3)  Nexium 40 Mg Cpdr (Esomeprazole magnesium) .... Take 1 tab by mouth at bedtime 4)  Niaspan 500 Mg Tbcr (Niacin (antihyperlipidemic)) .... Take 1 tablet by mouth once a day 5)  Crestor 20 Mg Tabs (Rosuvastatin calcium) .... Take 1 tablet by mouth once a day 6)  Lasix 20 Mg Tabs (Furosemide) .... Stopped 7)  Klor-con M20 20 Meq Tbcr (Potassium chloride crys cr) .... Take 1 tablet by mouth once a day 8)  Lisinopril 20 Mg Tabs (Lisinopril) .... Take 1 tablet by mouth once a day 9)  Nitrostat 0.4 Mg Subl (Nitroglycerin) .... As needed,uad 10)  Viagra 50 Mg Tabs (Sildenafil citrate) .... As needed, uad 11)  Prednisone 20 Mg Tabs (Prednisone) .... Uad 12)  Flexeril 10 Mg Tabs (Cyclobenzaprine hcl) .Marland Kitchen.. 1 tab @ bedtime 13)  Vicodin Es 7.5-750 Mg Tabs (Hydrocodone-acetaminophen) .Marland Kitchen.. 1 tab @ bedtime   Patient Instructions: 1)  begin prednisone 20-mg tablets two a day for 3 days, one a day for 3 days, then a half a tablet a day for 3 days, sleep on a heating pad at night.  Be sure to use low heat and cover the heating pad with a tile.  Take a half of Flexeril and a half of a pain pill at bedtime.  Return next Tuesday for re-examination. 2)  Go ahead and get her x-rays today or tomorrow or Friday.    Prescriptions: VIAGRA 50 MG  TABS (SILDENAFIL CITRATE) as needed, uad  #6 x 11   Entered and Authorized by:   Roderick Pee MD   Signed by:   Roderick Pee MD on 02/13/2007   Method used:   Print then Give to Patient   RxID:   [id number] NITROSTAT 0.4 MG  SUBL (NITROGLYCERIN) as needed,uad  #25 x 1   Entered and Authorized by:   Roderick Pee MD   Signed by:   Roderick Pee MD on 02/13/2007   Method used:   Print then Give to Patient   RxID:   [id number] CRESTOR 20 MG  TABS (ROSUVASTATIN CALCIUM) Take 1 tablet by mouth once a day  #100 x 4   Entered and Authorized by:   Roderick Pee MD   Signed by:   Roderick Pee MD on  02/13/2007   Method used:   Print then Give to Patient   RxID:   [id number] LISINOPRIL 20 MG  TABS (LISINOPRIL) Take 1 tablet by mouth once a day  #100 x 4   Entered and Authorized by:   Roderick Pee MD   Signed by:   Roderick Pee MD on 02/13/2007   Method used:   Print then Give to Patient   RxID:   [id number] KLOR-CON M20 20 MEQ  TBCR (POTASSIUM CHLORIDE  CRYS CR) Take 1 tablet by mouth once a day  #100 x 4   Entered and Authorized by:   Roderick Pee MD   Signed by:   Roderick Pee MD on 02/13/2007   Method used:   Print then Give to Patient   RxID:   [id number] LASIX 20 MG  TABS (FUROSEMIDE) stopped  #100 x 4   Entered and Authorized by:   Roderick Pee MD   Signed by:   Roderick Pee MD on 02/13/2007   Method used:   Print then Give to Patient   RxID:   [id number] NIASPAN 500 MG  TBCR (NIACIN (ANTIHYPERLIPIDEMIC)) Take 1 tablet by mouth once a day  #100 x 4   Entered and Authorized by:   Roderick Pee MD   Signed by:   Roderick Pee MD on 02/13/2007   Method used:   Print then Give to Patient   RxID:   [id number] NEXIUM 40 MG  CPDR (ESOMEPRAZOLE MAGNESIUM) Take 1 tab by mouth at bedtime  #100 x 4   Entered and Authorized by:   Roderick Pee MD   Signed by:   Roderick Pee MD on 02/13/2007   Method used:   Print then Give to Patient   RxID:   [id number] COUMADIN 5 MG  TABS (WARFARIN SODIUM) Take 1 tablet by mouth once a day  #100 x 4   Entered and Authorized by:   Roderick Pee MD   Signed by:   Roderick Pee MD on 02/13/2007   Method used:   Print then Give to Patient   RxID:   [id number] ATENOLOL 100 MG  TABS (ATENOLOL) Take 1 tablet by mouth once a day  #100 x 4   Entered and Authorized by:   Roderick Pee MD   Signed by:   Roderick Pee MD on 02/13/2007   Method used:   Print then Give to Patient   RxID:   [id number] VICODIN ES 7.5-750 MG  TABS (HYDROCODONE-ACETAMINOPHEN) 1 tab @ bedtime  #40 x 1   Entered  and Authorized by:   Roderick Pee MD   Signed by:   Roderick Pee MD on 02/13/2007   Method used:   Print then Give to Patient   RxID:   [id number] FLEXERIL 10 MG  TABS (CYCLOBENZAPRINE HCL) 1 tab @ bedtime  #40 x 1   Entered and Authorized by:   Roderick Pee MD   Signed by:   Roderick Pee MD on 02/13/2007   Method used:   Print then Give to Patient   RxID:   [id number] PREDNISONE 20 MG  TABS (PREDNISONE) UAD  #40 x 1   Entered and Authorized by:   Roderick Pee MD   Signed by:   Roderick Pee MD on 02/13/2007   Method used:   Print then Give to Patient   RxID:   [id number]  ]  Laboratory Results   Urine Tests   Date/Time Reported: February 13, 2007 11:06 AM   Routine Urinalysis   Color: yellow Appearance: Clear Glucose: negative   (Normal Range: Negative) Bilirubin: negative   (Normal Range: Negative) Ketone: negative   (Normal Range: Negative) Spec. Gravity: 1.020   (Normal Range: 1.003-1.035) Blood: 1+   (Normal Range: Negative) pH: 5.5   (Normal Range: 5.0-8.0) Protein: negative   (Normal Range: Negative) Urobilinogen: 0.2   (Normal Range: 0-1) Nitrite: negative   (Normal  Range: Negative) Leukocyte Esterace: negative   (Normal Range: Negative)    Comments: ..................................................................Marland KitchenWynona Canes, CMA  February 13, 2007 11:07 AM   Blood Tests    Date/Time Reported: February 13, 2007 11:16 AM   PT: 19.9 s   (Normal Range: 10.6-13.4)  INR: 2.7   (Normal Range: 0.88-1.12   Therap INR: 2.0-3.5) Comments: ..................................................................Marland KitchenWynona Canes, CMA  February 13, 2007 11:17 AM      ANTICOAGULATION RECORD PREVIOUS REGIMEN & LAB RESULTS Anticoagulation Diagnosis:  Deep venous thrombosis,v58.83,v58.61 on  10/25/2006 Previous INR Goal Range:  2-3 on  10/25/2006 Previous INR:  2.2 on  12/27/2006 Previous Coumadin Dose(mg):  5mg  qd on   12/27/2006 Previous Regimen:  same on  12/27/2006  NEW REGIMEN & LAB RESULTS Anticoag. Dx: G29.52,W41.32, 453.41 Current INR: 2.7 Regimen: same  (no change)  MEDICATIONS ATENOLOL 100 MG  TABS (ATENOLOL) Take 1 tablet by mouth once a day COUMADIN 5 MG  TABS (WARFARIN SODIUM) Take 1 tablet by mouth once a day NEXIUM 40 MG  CPDR (ESOMEPRAZOLE MAGNESIUM) Take 1 tab by mouth at bedtime NIASPAN 500 MG  TBCR (NIACIN (ANTIHYPERLIPIDEMIC)) Take 1 tablet by mouth once a day CRESTOR 20 MG  TABS (ROSUVASTATIN CALCIUM) Take 1 tablet by mouth once a day LASIX 20 MG  TABS (FUROSEMIDE) stopped KLOR-CON M20 20 MEQ  TBCR (POTASSIUM CHLORIDE CRYS CR) Take 1 tablet by mouth once a day LISINOPRIL 20 MG  TABS (LISINOPRIL) Take 1 tablet by mouth once a day NITROSTAT 0.4 MG  SUBL (NITROGLYCERIN) as needed,uad VIAGRA 50 MG  TABS (SILDENAFIL CITRATE) as needed, uad PREDNISONE 20 MG  TABS (PREDNISONE) UAD FLEXERIL 10 MG  TABS (CYCLOBENZAPRINE HCL) 1 tab @ bedtime VICODIN ES 7.5-750 MG  TABS (HYDROCODONE-ACETAMINOPHEN) 1 tab @ bedtime   Anticoagulation Visit Questionnaire      Coumadin dose missed/changed:  No      Abnormal Bleeding Symptoms:  No   Any diet changes including alcohol intake, vegetables or greens since the last visit:  No Any illnesses or hospitalizations since the last visit:  No Any signs of clotting since the last visit (including chest discomfort, dizziness, shortness of breath, arm tingling, slurred speech, swelling or redness in leg):  No

## 2010-03-18 NOTE — Assessment & Plan Note (Signed)
Summary: shingles inj/rcd  Nurse Visit   Allergies: No Known Drug Allergies  Immunizations Administered:  Zostavax # 1:    Vaccine Type: Zostavax    Site: left deltoid    Mfr: Merck    Dose: 0.65    Route: [place]    Given by: Kern Reap CMA (AAMA)    Exp. Date: 10/20/2010    Lot #: [id number]    VIS given: 11/26/04 given March 09, 2010.    Physician counseled: yes  Orders Added: 1)  Zoster (Shingles) Vaccine Live [90736] 2)  Admin 1st Vaccine [phone]

## 2010-03-18 NOTE — Assessment & Plan Note (Signed)
Summary: PT/RCD  Nurse Visit   Vital Signs:  Patient Profile:   70 Years Old Male Pulse rate:   57 / minute BP sitting:   129 / 73  (left arm) Cuff size:   regular                 Prior Medications: ATENOLOL 100 MG  TABS (ATENOLOL) Take 1 tablet by mouth once a day COUMADIN 5 MG  TABS (WARFARIN SODIUM) Take 1 tablet by mouth once a day NEXIUM 40 MG  CPDR (ESOMEPRAZOLE MAGNESIUM) Take 1 tab by mouth at bedtime NIASPAN 500 MG  TBCR (NIACIN (ANTIHYPERLIPIDEMIC)) Take 1 tablet by mouth once a day LASIX 20 MG  TABS (FUROSEMIDE) stopped KLOR-CON M20 20 MEQ  TBCR (POTASSIUM CHLORIDE CRYS CR) Take 1 tablet by mouth once a day LISINOPRIL 20 MG  TABS (LISINOPRIL) Take 1 tablet by mouth once a day NITROSTAT 0.4 MG  SUBL (NITROGLYCERIN) as needed,uad VIAGRA 50 MG  TABS (SILDENAFIL CITRATE) as needed, uad PREDNISONE 20 MG  TABS (PREDNISONE) UAD FLEXERIL 10 MG  TABS (CYCLOBENZAPRINE HCL) 1 tab @ bedtime VICODIN ES 7.5-750 MG  TABS (HYDROCODONE-ACETAMINOPHEN) 1 tab @ bedtime EQUATE NASAL SPRAY ()  CRESTOR 40 MG  TABS (ROSUVASTATIN CALCIUM) 1 tab @ bedtime Current Allergies: No known allergies  Laboratory Results   Blood Tests    Date/Time Reported: August 09, 2007 1:40 PM   PT: 16.7 s   (Normal Range: 10.6-13.4)  INR: 1.8   (Normal Range: 0.88-1.12   Therap INR: 2.0-3.5) Comments: Darra Lis RMA  August 09, 2007 1:40 PM       Orders Added: 1)  Est. Patient Level I [99211] 2)  Protime [85610QW] 3)  Fingerstick Xanthus.Diener    ]  ANTICOAGULATION RECORD PREVIOUS REGIMEN & LAB RESULTS Anticoagulation Diagnosis:  v58.83,v58.61, 453.41 on  02/13/2007 Previous INR Goal Range:  2-3 on  10/25/2006 Previous INR:  1.6 on  05/07/2007 Previous Coumadin Dose(mg):  5mg  qd on  12/27/2006 Previous Regimen:  same on  12/27/2006 Previous Coagulation Comments:  Missed 1 day on  05/07/2007  NEW REGIMEN & LAB RESULTS Current INR Goal Range: 2-3 Current INR: 1.8 Current Coumadin  Dose(mg): 5mg  QD Regimen: same Coagulation Comments: Patient will stop eating greens. Provider: Dr. Tawanna Cooler Repeat testing in: 4 weeks  Anticoagulation Visit Questionnaire Coumadin dose missed/changed:  No Abnormal Bleeding Symptoms:  No  Any diet changes including alcohol intake, vegetables or greens since the last visit:  No Any illnesses or hospitalizations since the last visit:  No Any signs of clotting since the last visit (including chest discomfort, dizziness, shortness of breath, arm tingling, slurred speech, swelling or redness in leg):  No  MEDICATIONS ATENOLOL 100 MG  TABS (ATENOLOL) Take 1 tablet by mouth once a day COUMADIN 5 MG  TABS (WARFARIN SODIUM) Take 1 tablet by mouth once a day NEXIUM 40 MG  CPDR (ESOMEPRAZOLE MAGNESIUM) Take 1 tab by mouth at bedtime NIASPAN 500 MG  TBCR (NIACIN (ANTIHYPERLIPIDEMIC)) Take 1 tablet by mouth once a day LASIX 20 MG  TABS (FUROSEMIDE) stopped KLOR-CON M20 20 MEQ  TBCR (POTASSIUM CHLORIDE CRYS CR) Take 1 tablet by mouth once a day LISINOPRIL 20 MG  TABS (LISINOPRIL) Take 1 tablet by mouth once a day NITROSTAT 0.4 MG  SUBL (NITROGLYCERIN) as needed,uad VIAGRA 50 MG  TABS (SILDENAFIL CITRATE) as needed, uad PREDNISONE 20 MG  TABS (PREDNISONE) UAD FLEXERIL 10 MG  TABS (CYCLOBENZAPRINE HCL) 1 tab @ bedtime VICODIN ES 7.5-750 MG  TABS (HYDROCODONE-ACETAMINOPHEN) 1 tab @ bedtime * EQUATE NASAL SPRAY  CRESTOR 40 MG  TABS (ROSUVASTATIN CALCIUM) 1 tab @ bedtime

## 2010-03-18 NOTE — Progress Notes (Signed)
Summary: colon report  Phone Note Call from Patient Call back at [phone]   Caller: Spouse Call For: [facility] Reason for Call: Talk to Nurse Summary of Call: has ? re: colon report done today  Initial call taken by: Guadlupe [facility],  March 25, 2008 3:38 PM  Follow-up for Phone Call        called pt back wife who was care partner w/ pt. today asked what as needed means under meds. listed last dose column. told pt. it means takes med. as needed. Follow-up by: penny hylton r. n.

## 2010-03-18 NOTE — Assessment & Plan Note (Signed)
Summary: flu shot/njr  Nurse Visit    Prior Medications: ATENOLOL 100 MG  TABS (ATENOLOL) Take 1 tablet by mouth once a day COUMADIN 5 MG  TABS (WARFARIN SODIUM) Take 1 tablet by mouth once a day NEXIUM 40 MG  CPDR (ESOMEPRAZOLE MAGNESIUM) Take 1 tab by mouth at bedtime NIASPAN 500 MG  TBCR (NIACIN (ANTIHYPERLIPIDEMIC)) Take 1 tablet by mouth once a day LASIX 20 MG  TABS (FUROSEMIDE) stopped KLOR-CON M20 20 MEQ  TBCR (POTASSIUM CHLORIDE CRYS CR) Take 1 tablet by mouth once a day LISINOPRIL 20 MG  TABS (LISINOPRIL) Take 1 tablet by mouth once a day NITROSTAT 0.4 MG  SUBL (NITROGLYCERIN) as needed,uad VIAGRA 50 MG  TABS (SILDENAFIL CITRATE) as needed, uad PREDNISONE 20 MG  TABS (PREDNISONE) UAD FLEXERIL 10 MG  TABS (CYCLOBENZAPRINE HCL) 1 tab @ bedtime VICODIN ES 7.5-750 MG  TABS (HYDROCODONE-ACETAMINOPHEN) 1 tab @ bedtime EQUATE NASAL SPRAY ()  CRESTOR 40 MG  TABS (ROSUVASTATIN CALCIUM) 1 tab @ bedtime Current Allergies: No known allergies   Flu Vaccine Consent Questions     Do you have a history of severe allergic reactions to this vaccine? no    Any prior history of allergic reactions to egg and/or gelatin? no    Do you have a sensitivity to the preservative Thimersol? no    Do you have a past history of Guillan-Barre Syndrome? no    Do you currently have an acute febrile illness? no    Have you ever had a severe reaction to latex? no    Vaccine information given and explained to patient? yes    Are you currently pregnant? no    Lot Number:[id number]   Site Given  Left Deltoid IM   Orders Added: 1)  Flu Vaccine 48yrs + [09811] 2)  Administration Flu vaccine [G0008]    ]

## 2010-03-19 NOTE — Letter (Signed)
Summary: [facility]   Imported By: Lester [place] 07/28/2009 14:30:22  _____________________________________________________________________  External Attachment:    Type:   Image     Comment:   External Document

## 2010-03-24 ENCOUNTER — Other Ambulatory Visit: Payer: Self-pay | Admitting: *Deleted

## 2010-03-24 DIAGNOSIS — K219 Gastro-esophageal reflux disease without esophagitis: Secondary | ICD-10-CM

## 2010-03-24 MED ORDER — ESOMEPRAZOLE MAGNESIUM 40 MG PO CPDR: 40.0000 mg | DELAYED_RELEASE_CAPSULE | Freq: Every day | ORAL | Status: DC

## 2010-03-24 NOTE — Progress Notes (Signed)
Summary: REQUEST FOR RX  Phone Note Refill Request Message from:  Patient on March 11, 2010 10:46 AM  Refills Requested: Medication #1:  ACIPHEX / DEXILANT  60MG    Notes: [facility] [address]...Marland KitchenMarland KitchenWas given samples, currently out of same...Marland KitchenMarland KitchenWould like Rx for Dexilant sent to pharmacy.    Initial call taken by: Debbra Riding,  March 11, 2010 10:48 AM  Follow-up for Phone Call        Pt called to ck status of refill / RX.  Follow-up by: Debbra Riding,  March 12, 2010 12:19 PM  Additional Follow-up for Phone Call Additional follow up Details #1::        left message on machine for patient to return our call Additional Follow-up by: Kern Reap CMA Duncan Dull),  March 12, 2010 4:06 PM    Additional Follow-up for Phone Call Additional follow up Details #2::    left message on machine Follow-up by: Kern Reap CMA Duncan Dull),  March 15, 2010 11:27 AM

## 2010-03-24 NOTE — Progress Notes (Signed)
Summary: rx request  Phone Note Call from Patient   Caller: Patient Reason for Call: Talk to Doctor Summary of Call: patient is calling because he has tried all OTC and had a prescription for omeprazole and they did not work.  Nexium is too expensive. He would really like to continue with a rx for dexilant if at all possible. Initial call taken by: Kern Reap CMA Duncan Dull),  March 15, 2010 1:59 PM  Follow-up for Phone Call        this product is a combination of omeprazole, and bicarbonate.  He can get the same result by taking the omeprazole OTC and the liquid antacid like Mylicon two tablespoons twice daily or if he would like the Brand name  product.  Have him call GI Follow-up by: Roderick Pee MD,  March 15, 2010 2:50 PM

## 2010-03-25 ENCOUNTER — Ambulatory Visit (INDEPENDENT_AMBULATORY_CARE_PROVIDER_SITE_OTHER): Payer: Medicare Other | Admitting: Family Medicine

## 2010-03-25 ENCOUNTER — Encounter: Payer: Self-pay | Admitting: Family Medicine

## 2010-03-25 VITALS — BP 150/90 | Temp 98.3°F | Ht 71.75 in | Wt 232.0 lb

## 2010-03-25 DIAGNOSIS — I82409 Acute embolism and thrombosis of unspecified deep veins of unspecified lower extremity: Secondary | ICD-10-CM

## 2010-03-25 DIAGNOSIS — E785 Hyperlipidemia, unspecified: Secondary | ICD-10-CM

## 2010-03-25 DIAGNOSIS — K219 Gastro-esophageal reflux disease without esophagitis: Secondary | ICD-10-CM

## 2010-03-25 LAB — POCT INR: INR: 2.1

## 2010-03-25 MED ORDER — ESOMEPRAZOLE MAGNESIUM 40 MG PO CPDR: 40.0000 mg | DELAYED_RELEASE_CAPSULE | Freq: Every day | ORAL | Status: DC

## 2010-03-25 MED ORDER — ROSUVASTATIN CALCIUM 20 MG PO TABS: 20.0000 mg | ORAL_TABLET | Freq: Every day | ORAL | Status: DC

## 2010-03-25 MED ORDER — LISINOPRIL 40 MG PO TABS: 40.0000 mg | ORAL_TABLET | Freq: Every day | ORAL | Status: DC

## 2010-03-25 NOTE — Patient Instructions (Signed)
Take Nexium 40 mg q.a.m. Daily.  Crestor 20 mg Monday, and Thursday.  Return in 3 months for lipid and liver panel

## 2010-03-25 NOTE — Progress Notes (Signed)
  Subjective:    Patient ID: Fortino Haag, male    DOB: Nov 09, 1940, 70 y.o.   MRN: [id number]  HPI  [name] is a 70 year old male, who comes in today for follow-up of two problems.  He has a history of hyperlipidemia, and underlying coronary disease.  His LDL cholesterol is 184.  In the, past.  He's been intolerant of statins.  They give him severe muscle pain and joint pain.  Recently, he went to see his cardiologist, Dr. Doylene Canard, who recommended that he start back on the Crestor and take a 20-mg tablet on Monday and Thursday.  He started to have muscle and joint pain.  Again, and wants to know what to do.  I advised him he only has two options either take the medicine or not.  He also has a history of chronic reflux esophagitis and would like Nexium 40 mg daily.  He states the over-the-counter omeprazole, does not help his symptoms.  Review of Systems Negative    Objective:   Physical Exam Well-developed well-nourished, male, no acute distress       Assessment & Plan:

## 2010-03-29 ENCOUNTER — Other Ambulatory Visit: Payer: Self-pay | Admitting: *Deleted

## 2010-03-29 DIAGNOSIS — E785 Hyperlipidemia, unspecified: Secondary | ICD-10-CM

## 2010-03-29 MED ORDER — ROSUVASTATIN CALCIUM 20 MG PO TABS: 20.0000 mg | ORAL_TABLET | Freq: Every day | ORAL | Status: DC

## 2010-04-22 ENCOUNTER — Other Ambulatory Visit (INDEPENDENT_AMBULATORY_CARE_PROVIDER_SITE_OTHER): Payer: Medicare Other

## 2010-04-22 ENCOUNTER — Other Ambulatory Visit: Payer: Self-pay | Admitting: Family Medicine

## 2010-04-22 DIAGNOSIS — I82409 Acute embolism and thrombosis of unspecified deep veins of unspecified lower extremity: Secondary | ICD-10-CM

## 2010-04-22 DIAGNOSIS — E785 Hyperlipidemia, unspecified: Secondary | ICD-10-CM

## 2010-04-22 LAB — LIPID PANEL
Cholesterol: 205 mg/dL — ABNORMAL HIGH (ref 0–200)
HDL: 41.9 mg/dL (ref >39.00)
Total CHOL/HDL Ratio: 5
Triglycerides: 167 mg/dL — ABNORMAL HIGH (ref 0.0–149.0)
VLDL: 33.4 mg/dL (ref 0.0–40.0)

## 2010-04-22 LAB — POCT INR: INR: 2.3

## 2010-04-22 LAB — LDL CHOLESTEROL, DIRECT: Direct LDL: 131.5 mg/dL

## 2010-05-05 LAB — COMPREHENSIVE METABOLIC PANEL
ALT: 24 U/L (ref 0–53)
AST: 28 U/L (ref 0–37)
Albumin: 3.8 g/dL (ref 3.5–5.2)
Alkaline Phosphatase: 42 U/L (ref 39–117)
BUN: 12 mg/dL (ref 6–23)
CO2: 29 mEq/L (ref 19–32)
Calcium: 9.4 mg/dL (ref 8.4–10.5)
Chloride: 108 mEq/L (ref 96–112)
Creatinine, Ser: 0.78 mg/dL (ref 0.4–1.5)
GFR calc Af Amer: >60 mL/min (ref >60)
GFR calc non Af Amer: >60 mL/min (ref >60)
Glucose, Bld: 90 mg/dL (ref 70–99)
Potassium: 4.2 mEq/L (ref 3.5–5.1)
Sodium: 141 mEq/L (ref 135–145)
Total Bilirubin: 0.5 mg/dL (ref 0.3–1.2)
Total Protein: 6.6 g/dL (ref 6.0–8.3)

## 2010-05-05 LAB — DIFFERENTIAL
Basophils Absolute: 0.1 10*3/uL (ref 0.0–0.1)
Basophils Relative: 1 % (ref 0–1)
Eosinophils Absolute: 0.1 10*3/uL (ref 0.0–0.7)
Eosinophils Relative: 2 % (ref 0–5)
Lymphocytes Relative: 33 % (ref 12–46)
Lymphs Abs: 2.1 10*3/uL (ref 0.7–4.0)
Monocytes Absolute: 0.8 10*3/uL (ref 0.1–1.0)
Monocytes Relative: 12 % (ref 3–12)
Neutro Abs: 3.3 10*3/uL (ref 1.7–7.7)
Neutrophils Relative %: 52 % (ref 43–77)

## 2010-05-05 LAB — CBC
HCT: 40.8 % (ref 39.0–52.0)
Hemoglobin: 13.3 g/dL (ref 13.0–17.0)
MCHC: 32.5 g/dL (ref 30.0–36.0)
MCV: 83.5 fL (ref 78.0–100.0)
Platelets: 247 10*3/uL (ref 150–400)
RBC: 4.88 MIL/uL (ref 4.22–5.81)
RDW: 16.2 % — ABNORMAL HIGH (ref 11.5–15.5)
WBC: 6.4 10*3/uL (ref 4.0–10.5)

## 2010-05-05 LAB — URINALYSIS, ROUTINE W REFLEX MICROSCOPIC
Bilirubin Urine: NEGATIVE
Glucose, UA: NEGATIVE mg/dL
Ketones, ur: NEGATIVE mg/dL
Leukocytes, UA: NEGATIVE
Nitrite: NEGATIVE
Protein, ur: NEGATIVE mg/dL
Specific Gravity, Urine: 1.017 (ref 1.005–1.030)
Urobilinogen, UA: 0.2 mg/dL (ref 0.0–1.0)
pH: 6 (ref 5.0–8.0)

## 2010-05-05 LAB — URINE MICROSCOPIC-ADD ON

## 2010-05-05 LAB — PROTIME-INR
INR: 1.13 (ref 0.00–1.49)
INR: 1.24 (ref 0.00–1.49)
INR: 1.35 (ref 0.00–1.49)
Prothrombin Time: 14.4 seconds (ref 11.6–15.2)
Prothrombin Time: 15.5 seconds — ABNORMAL HIGH (ref 11.6–15.2)
Prothrombin Time: 16.6 seconds — ABNORMAL HIGH (ref 11.6–15.2)

## 2010-05-09 LAB — BASIC METABOLIC PANEL
BUN: 4 mg/dL — ABNORMAL LOW (ref 6–23)
BUN: 6 mg/dL (ref 6–23)
CO2: 26 mEq/L (ref 19–32)
CO2: 28 mEq/L (ref 19–32)
Calcium: 8.7 mg/dL (ref 8.4–10.5)
Calcium: 9.2 mg/dL (ref 8.4–10.5)
Chloride: 105 mEq/L (ref 96–112)
Chloride: 105 mEq/L (ref 96–112)
Creatinine, Ser: 0.72 mg/dL (ref 0.4–1.5)
Creatinine, Ser: 0.81 mg/dL (ref 0.4–1.5)
GFR calc Af Amer: >60 mL/min (ref >60)
GFR calc Af Amer: >60 mL/min (ref >60)
GFR calc non Af Amer: >60 mL/min (ref >60)
GFR calc non Af Amer: >60 mL/min (ref >60)
Glucose, Bld: 137 mg/dL — ABNORMAL HIGH (ref 70–99)
Glucose, Bld: 137 mg/dL — ABNORMAL HIGH (ref 70–99)
Potassium: 3.7 mEq/L (ref 3.5–5.1)
Potassium: 4.3 mEq/L (ref 3.5–5.1)
Sodium: 136 mEq/L (ref 135–145)
Sodium: 136 mEq/L (ref 135–145)

## 2010-05-09 LAB — URINALYSIS, ROUTINE W REFLEX MICROSCOPIC
Bilirubin Urine: NEGATIVE
Glucose, UA: NEGATIVE mg/dL
Hgb urine dipstick: NEGATIVE
Ketones, ur: NEGATIVE mg/dL
Nitrite: NEGATIVE
Protein, ur: NEGATIVE mg/dL
Specific Gravity, Urine: 1.011 (ref 1.005–1.030)
Urobilinogen, UA: 0.2 mg/dL (ref 0.0–1.0)
pH: 5.5 (ref 5.0–8.0)

## 2010-05-09 LAB — COMPREHENSIVE METABOLIC PANEL
ALT: 21 U/L (ref 0–53)
AST: 28 U/L (ref 0–37)
Albumin: 4.1 g/dL (ref 3.5–5.2)
Alkaline Phosphatase: 47 U/L (ref 39–117)
BUN: 11 mg/dL (ref 6–23)
CO2: 27 mEq/L (ref 19–32)
Calcium: 9.5 mg/dL (ref 8.4–10.5)
Chloride: 107 mEq/L (ref 96–112)
Creatinine, Ser: 0.72 mg/dL (ref 0.4–1.5)
GFR calc Af Amer: >60 mL/min (ref >60)
GFR calc non Af Amer: >60 mL/min (ref >60)
Glucose, Bld: 92 mg/dL (ref 70–99)
Potassium: 4.2 mEq/L (ref 3.5–5.1)
Sodium: 137 mEq/L (ref 135–145)
Total Bilirubin: 0.8 mg/dL (ref 0.3–1.2)
Total Protein: 6.7 g/dL (ref 6.0–8.3)

## 2010-05-09 LAB — CBC
HCT: 37.3 % — ABNORMAL LOW (ref 39.0–52.0)
HCT: 39.3 % (ref 39.0–52.0)
HCT: 41.3 % (ref 39.0–52.0)
Hemoglobin: 12.3 g/dL — ABNORMAL LOW (ref 13.0–17.0)
Hemoglobin: 13 g/dL (ref 13.0–17.0)
Hemoglobin: 13.4 g/dL (ref 13.0–17.0)
MCHC: 32.6 g/dL (ref 30.0–36.0)
MCHC: 32.9 g/dL (ref 30.0–36.0)
MCHC: 33.1 g/dL (ref 30.0–36.0)
MCV: 84.5 fL (ref 78.0–100.0)
MCV: 85 fL (ref 78.0–100.0)
MCV: 85.1 fL (ref 78.0–100.0)
Platelets: 169 10*3/uL (ref 150–400)
Platelets: 172 10*3/uL (ref 150–400)
Platelets: 209 10*3/uL (ref 150–400)
RBC: 4.38 MIL/uL (ref 4.22–5.81)
RBC: 4.65 MIL/uL (ref 4.22–5.81)
RBC: 4.85 MIL/uL (ref 4.22–5.81)
RDW: 14.6 % (ref 11.5–15.5)
RDW: 15.1 % (ref 11.5–15.5)
RDW: 15.5 % (ref 11.5–15.5)
WBC: 10.2 10*3/uL (ref 4.0–10.5)
WBC: 11.4 10*3/uL — ABNORMAL HIGH (ref 4.0–10.5)
WBC: 5.5 10*3/uL (ref 4.0–10.5)

## 2010-05-09 LAB — DIFFERENTIAL
Basophils Absolute: 0.1 10*3/uL (ref 0.0–0.1)
Basophils Relative: 2 % — ABNORMAL HIGH (ref 0–1)
Eosinophils Absolute: 0.1 10*3/uL (ref 0.0–0.7)
Eosinophils Relative: 2 % (ref 0–5)
Lymphocytes Relative: 35 % (ref 12–46)
Lymphs Abs: 1.9 10*3/uL (ref 0.7–4.0)
Monocytes Absolute: 0.6 10*3/uL (ref 0.1–1.0)
Monocytes Relative: 12 % (ref 3–12)
Neutro Abs: 2.7 10*3/uL (ref 1.7–7.7)
Neutrophils Relative %: 50 % (ref 43–77)

## 2010-05-09 LAB — MRSA PCR SCREENING: MRSA by PCR: NEGATIVE

## 2010-05-09 LAB — PROTIME-INR
INR: 1.02 (ref 0.00–1.49)
INR: 1.94 — ABNORMAL HIGH (ref 0.00–1.49)
Prothrombin Time: 13.3 seconds (ref 11.6–15.2)
Prothrombin Time: 22 seconds — ABNORMAL HIGH (ref 11.6–15.2)

## 2010-05-11 ENCOUNTER — Other Ambulatory Visit: Payer: Self-pay | Admitting: Family Medicine

## 2010-05-23 LAB — BASIC METABOLIC PANEL
BUN: 5 mg/dL — ABNORMAL LOW (ref 6–23)
CO2: 30 mEq/L (ref 19–32)
Calcium: 9 mg/dL (ref 8.4–10.5)
Chloride: 103 mEq/L (ref 96–112)
Creatinine, Ser: 0.91 mg/dL (ref 0.4–1.5)
GFR calc Af Amer: >60 mL/min (ref >60)
GFR calc non Af Amer: >60 mL/min (ref >60)
Glucose, Bld: 87 mg/dL (ref 70–99)
Potassium: 3.8 mEq/L (ref 3.5–5.1)
Sodium: 140 mEq/L (ref 135–145)

## 2010-05-23 LAB — PROTIME-INR
INR: 1.5 (ref 0.00–1.49)
INR: 1.7 — ABNORMAL HIGH (ref 0.00–1.49)
INR: 1.7 — ABNORMAL HIGH (ref 0.00–1.49)
INR: 2 — ABNORMAL HIGH (ref 0.00–1.49)
INR: 2 — ABNORMAL HIGH (ref 0.00–1.49)
INR: 2 — ABNORMAL HIGH (ref 0.00–1.49)
Prothrombin Time: 19.1 seconds — ABNORMAL HIGH (ref 11.6–15.2)
Prothrombin Time: 21 seconds — ABNORMAL HIGH (ref 11.6–15.2)
Prothrombin Time: 21.3 seconds — ABNORMAL HIGH (ref 11.6–15.2)
Prothrombin Time: 23.6 seconds — ABNORMAL HIGH (ref 11.6–15.2)
Prothrombin Time: 23.6 seconds — ABNORMAL HIGH (ref 11.6–15.2)
Prothrombin Time: 24 seconds — ABNORMAL HIGH (ref 11.6–15.2)

## 2010-05-23 LAB — CBC
HCT: 37.6 % — ABNORMAL LOW (ref 39.0–52.0)
Hemoglobin: 12.7 g/dL — ABNORMAL LOW (ref 13.0–17.0)
MCHC: 33.7 g/dL (ref 30.0–36.0)
MCV: 86.7 fL (ref 78.0–100.0)
Platelets: 330 10*3/uL (ref 150–400)
RBC: 4.34 MIL/uL (ref 4.22–5.81)
RDW: 14.6 % (ref 11.5–15.5)
WBC: 7.9 10*3/uL (ref 4.0–10.5)

## 2010-05-24 ENCOUNTER — Other Ambulatory Visit (INDEPENDENT_AMBULATORY_CARE_PROVIDER_SITE_OTHER): Payer: Medicare Other | Admitting: Family Medicine

## 2010-05-24 ENCOUNTER — Ambulatory Visit (INDEPENDENT_AMBULATORY_CARE_PROVIDER_SITE_OTHER): Payer: Medicare Other | Admitting: Family Medicine

## 2010-05-24 DIAGNOSIS — I82409 Acute embolism and thrombosis of unspecified deep veins of unspecified lower extremity: Secondary | ICD-10-CM

## 2010-05-24 DIAGNOSIS — K219 Gastro-esophageal reflux disease without esophagitis: Secondary | ICD-10-CM

## 2010-05-24 LAB — BASIC METABOLIC PANEL
BUN: 12 mg/dL (ref 6–23)
BUN: 18 mg/dL (ref 6–23)
BUN: 18 mg/dL (ref 6–23)
BUN: 6 mg/dL (ref 6–23)
BUN: 9 mg/dL (ref 6–23)
CO2: 23 mEq/L (ref 19–32)
CO2: 24 mEq/L (ref 19–32)
CO2: 27 mEq/L (ref 19–32)
CO2: 27 mEq/L (ref 19–32)
CO2: 29 mEq/L (ref 19–32)
Calcium: 8.1 mg/dL — ABNORMAL LOW (ref 8.4–10.5)
Calcium: 8.3 mg/dL — ABNORMAL LOW (ref 8.4–10.5)
Calcium: 8.6 mg/dL (ref 8.4–10.5)
Calcium: 8.7 mg/dL (ref 8.4–10.5)
Calcium: 8.9 mg/dL (ref 8.4–10.5)
Chloride: 102 mEq/L (ref 96–112)
Chloride: 104 mEq/L (ref 96–112)
Chloride: 104 mEq/L (ref 96–112)
Chloride: 105 mEq/L (ref 96–112)
Chloride: 106 mEq/L (ref 96–112)
Creatinine, Ser: 0.76 mg/dL (ref 0.4–1.5)
Creatinine, Ser: 0.78 mg/dL (ref 0.4–1.5)
Creatinine, Ser: 0.78 mg/dL (ref 0.4–1.5)
Creatinine, Ser: 0.9 mg/dL (ref 0.4–1.5)
Creatinine, Ser: 0.98 mg/dL (ref 0.4–1.5)
GFR calc Af Amer: >60 mL/min (ref >60)
GFR calc Af Amer: >60 mL/min (ref >60)
GFR calc Af Amer: >60 mL/min (ref >60)
GFR calc Af Amer: >60 mL/min (ref >60)
GFR calc Af Amer: >60 mL/min (ref >60)
GFR calc non Af Amer: >60 mL/min (ref >60)
GFR calc non Af Amer: >60 mL/min (ref >60)
GFR calc non Af Amer: >60 mL/min (ref >60)
GFR calc non Af Amer: >60 mL/min (ref >60)
GFR calc non Af Amer: >60 mL/min (ref >60)
Glucose, Bld: 108 mg/dL — ABNORMAL HIGH (ref 70–99)
Glucose, Bld: 120 mg/dL — ABNORMAL HIGH (ref 70–99)
Glucose, Bld: 142 mg/dL — ABNORMAL HIGH (ref 70–99)
Glucose, Bld: 164 mg/dL — ABNORMAL HIGH (ref 70–99)
Glucose, Bld: 188 mg/dL — ABNORMAL HIGH (ref 70–99)
Potassium: 3.9 mEq/L (ref 3.5–5.1)
Potassium: 3.9 mEq/L (ref 3.5–5.1)
Potassium: 4 mEq/L (ref 3.5–5.1)
Potassium: 4 mEq/L (ref 3.5–5.1)
Potassium: 4 mEq/L (ref 3.5–5.1)
Sodium: 132 mEq/L — ABNORMAL LOW (ref 135–145)
Sodium: 133 mEq/L — ABNORMAL LOW (ref 135–145)
Sodium: 135 mEq/L (ref 135–145)
Sodium: 138 mEq/L (ref 135–145)
Sodium: 139 mEq/L (ref 135–145)

## 2010-05-24 LAB — CBC
HCT: 32.7 % — ABNORMAL LOW (ref 39.0–52.0)
HCT: 37 % — ABNORMAL LOW (ref 39.0–52.0)
HCT: 37.3 % — ABNORMAL LOW (ref 39.0–52.0)
HCT: 38 % — ABNORMAL LOW (ref 39.0–52.0)
HCT: 38.3 % — ABNORMAL LOW (ref 39.0–52.0)
HCT: 39.3 % (ref 39.0–52.0)
HCT: 40.4 % (ref 39.0–52.0)
HCT: 44.1 % (ref 39.0–52.0)
Hemoglobin: 11 g/dL — ABNORMAL LOW (ref 13.0–17.0)
Hemoglobin: 12.4 g/dL — ABNORMAL LOW (ref 13.0–17.0)
Hemoglobin: 12.5 g/dL — ABNORMAL LOW (ref 13.0–17.0)
Hemoglobin: 12.6 g/dL — ABNORMAL LOW (ref 13.0–17.0)
Hemoglobin: 12.9 g/dL — ABNORMAL LOW (ref 13.0–17.0)
Hemoglobin: 13.1 g/dL (ref 13.0–17.0)
Hemoglobin: 13.6 g/dL (ref 13.0–17.0)
Hemoglobin: 14.9 g/dL (ref 13.0–17.0)
MCHC: 32.6 g/dL (ref 30.0–36.0)
MCHC: 33.2 g/dL (ref 30.0–36.0)
MCHC: 33.5 g/dL (ref 30.0–36.0)
MCHC: 33.6 g/dL (ref 30.0–36.0)
MCHC: 33.7 g/dL (ref 30.0–36.0)
MCHC: 33.7 g/dL (ref 30.0–36.0)
MCHC: 33.8 g/dL (ref 30.0–36.0)
MCHC: 33.8 g/dL (ref 30.0–36.0)
MCV: 86.3 fL (ref 78.0–100.0)
MCV: 86.5 fL (ref 78.0–100.0)
MCV: 86.8 fL (ref 78.0–100.0)
MCV: 86.8 fL (ref 78.0–100.0)
MCV: 87 fL (ref 78.0–100.0)
MCV: 87.2 fL (ref 78.0–100.0)
MCV: 87.2 fL (ref 78.0–100.0)
MCV: 87.5 fL (ref 78.0–100.0)
Platelets: 185 10*3/uL (ref 150–400)
Platelets: 195 10*3/uL (ref 150–400)
Platelets: 200 10*3/uL (ref 150–400)
Platelets: 207 10*3/uL (ref 150–400)
Platelets: 209 10*3/uL (ref 150–400)
Platelets: 221 10*3/uL (ref 150–400)
Platelets: 324 10*3/uL (ref 150–400)
Platelets: 343 10*3/uL (ref 150–400)
RBC: 3.77 MIL/uL — ABNORMAL LOW (ref 4.22–5.81)
RBC: 4.28 MIL/uL (ref 4.22–5.81)
RBC: 4.28 MIL/uL (ref 4.22–5.81)
RBC: 4.37 MIL/uL (ref 4.22–5.81)
RBC: 4.42 MIL/uL (ref 4.22–5.81)
RBC: 4.53 MIL/uL (ref 4.22–5.81)
RBC: 4.62 MIL/uL (ref 4.22–5.81)
RBC: 5.06 MIL/uL (ref 4.22–5.81)
RDW: 13.8 % (ref 11.5–15.5)
RDW: 14 % (ref 11.5–15.5)
RDW: 14 % (ref 11.5–15.5)
RDW: 14 % (ref 11.5–15.5)
RDW: 14.1 % (ref 11.5–15.5)
RDW: 14.2 % (ref 11.5–15.5)
RDW: 14.2 % (ref 11.5–15.5)
RDW: 14.3 % (ref 11.5–15.5)
WBC: 10.4 10*3/uL (ref 4.0–10.5)
WBC: 10.4 10*3/uL (ref 4.0–10.5)
WBC: 11.2 10*3/uL — ABNORMAL HIGH (ref 4.0–10.5)
WBC: 12.6 10*3/uL — ABNORMAL HIGH (ref 4.0–10.5)
WBC: 12.9 10*3/uL — ABNORMAL HIGH (ref 4.0–10.5)
WBC: 13.3 10*3/uL — ABNORMAL HIGH (ref 4.0–10.5)
WBC: 14.8 10*3/uL — ABNORMAL HIGH (ref 4.0–10.5)
WBC: 8.5 10*3/uL (ref 4.0–10.5)

## 2010-05-24 LAB — LIPID PANEL
Cholesterol: 108 mg/dL (ref 0–200)
HDL: 51 mg/dL (ref >39)
LDL Cholesterol: 48 mg/dL (ref 0–99)
Total CHOL/HDL Ratio: 2.1 RATIO
Triglycerides: 46 mg/dL (ref <150)
VLDL: 9 mg/dL (ref 0–40)

## 2010-05-24 LAB — PROTIME-INR
INR: 1.3 (ref 0.00–1.49)
INR: 1.4 (ref 0.00–1.49)
INR: 1.5 (ref 0.00–1.49)
INR: 1.8 — ABNORMAL HIGH (ref 0.00–1.49)
INR: 2.1 — ABNORMAL HIGH (ref 0.00–1.49)
INR: 2.3 — ABNORMAL HIGH (ref 0.00–1.49)
Prothrombin Time: 16.8 seconds — ABNORMAL HIGH (ref 11.6–15.2)
Prothrombin Time: 17.7 seconds — ABNORMAL HIGH (ref 11.6–15.2)
Prothrombin Time: 19.2 seconds — ABNORMAL HIGH (ref 11.6–15.2)
Prothrombin Time: 21.5 seconds — ABNORMAL HIGH (ref 11.6–15.2)
Prothrombin Time: 25.2 seconds — ABNORMAL HIGH (ref 11.6–15.2)
Prothrombin Time: 27 seconds — ABNORMAL HIGH (ref 11.6–15.2)

## 2010-05-24 LAB — PREPARE FRESH FROZEN PLASMA

## 2010-05-24 LAB — URINALYSIS, ROUTINE W REFLEX MICROSCOPIC
Glucose, UA: NEGATIVE mg/dL
Ketones, ur: 15 mg/dL — AB
Leukocytes, UA: NEGATIVE
Nitrite: NEGATIVE
Protein, ur: 30 mg/dL — AB
Specific Gravity, Urine: 1.031 — ABNORMAL HIGH (ref 1.005–1.030)
Urobilinogen, UA: 1 mg/dL (ref 0.0–1.0)
pH: 5 (ref 5.0–8.0)

## 2010-05-24 LAB — COMPREHENSIVE METABOLIC PANEL
ALT: 23 U/L (ref 0–53)
ALT: 24 U/L (ref 0–53)
AST: 29 U/L (ref 0–37)
AST: 34 U/L (ref 0–37)
Albumin: 2.8 g/dL — ABNORMAL LOW (ref 3.5–5.2)
Albumin: 3.4 g/dL — ABNORMAL LOW (ref 3.5–5.2)
Alkaline Phosphatase: 53 U/L (ref 39–117)
Alkaline Phosphatase: 60 U/L (ref 39–117)
BUN: 17 mg/dL (ref 6–23)
BUN: 18 mg/dL (ref 6–23)
CO2: 22 mEq/L (ref 19–32)
CO2: 26 mEq/L (ref 19–32)
Calcium: 9.1 mg/dL (ref 8.4–10.5)
Calcium: 9.5 mg/dL (ref 8.4–10.5)
Chloride: 106 mEq/L (ref 96–112)
Chloride: 108 mEq/L (ref 96–112)
Creatinine, Ser: 1.08 mg/dL (ref 0.4–1.5)
Creatinine, Ser: 1.13 mg/dL (ref 0.4–1.5)
GFR calc Af Amer: >60 mL/min (ref >60)
GFR calc Af Amer: >60 mL/min (ref >60)
GFR calc non Af Amer: >60 mL/min (ref >60)
GFR calc non Af Amer: >60 mL/min (ref >60)
Glucose, Bld: 139 mg/dL — ABNORMAL HIGH (ref 70–99)
Glucose, Bld: 164 mg/dL — ABNORMAL HIGH (ref 70–99)
Potassium: 4.2 mEq/L (ref 3.5–5.1)
Potassium: 4.4 mEq/L (ref 3.5–5.1)
Sodium: 136 mEq/L (ref 135–145)
Sodium: 138 mEq/L (ref 135–145)
Total Bilirubin: 0.9 mg/dL (ref 0.3–1.2)
Total Bilirubin: 1.1 mg/dL (ref 0.3–1.2)
Total Protein: 6.2 g/dL (ref 6.0–8.3)
Total Protein: 7.2 g/dL (ref 6.0–8.3)

## 2010-05-24 LAB — DIFFERENTIAL
Basophils Absolute: 0 10*3/uL (ref 0.0–0.1)
Basophils Absolute: 0 10*3/uL (ref 0.0–0.1)
Basophils Relative: 0 % (ref 0–1)
Basophils Relative: 0 % (ref 0–1)
Eosinophils Absolute: 0 10*3/uL (ref 0.0–0.7)
Eosinophils Absolute: 0 10*3/uL (ref 0.0–0.7)
Eosinophils Relative: 0 % (ref 0–5)
Eosinophils Relative: 0 % (ref 0–5)
Lymphocytes Relative: 10 % — ABNORMAL LOW (ref 12–46)
Lymphocytes Relative: 7 % — ABNORMAL LOW (ref 12–46)
Lymphs Abs: 1 10*3/uL (ref 0.7–4.0)
Lymphs Abs: 1 10*3/uL (ref 0.7–4.0)
Monocytes Absolute: 0.5 10*3/uL (ref 0.1–1.0)
Monocytes Absolute: 0.9 10*3/uL (ref 0.1–1.0)
Monocytes Relative: 5 % (ref 3–12)
Monocytes Relative: 6 % (ref 3–12)
Neutro Abs: 12.8 10*3/uL — ABNORMAL HIGH (ref 1.7–7.7)
Neutro Abs: 8.8 10*3/uL — ABNORMAL HIGH (ref 1.7–7.7)
Neutrophils Relative %: 85 % — ABNORMAL HIGH (ref 43–77)
Neutrophils Relative %: 87 % — ABNORMAL HIGH (ref 43–77)

## 2010-05-24 LAB — LIPASE, BLOOD: Lipase: 12 U/L (ref 11–59)

## 2010-05-24 LAB — CULTURE, BLOOD (ROUTINE X 2): Culture: NO GROWTH

## 2010-05-24 LAB — POCT I-STAT, CHEM 8
BUN: 20 mg/dL (ref 6–23)
Calcium, Ion: 1.2 mmol/L (ref 1.12–1.32)
Chloride: 105 mEq/L (ref 96–112)
Creatinine, Ser: 1.1 mg/dL (ref 0.4–1.5)
Glucose, Bld: 156 mg/dL — ABNORMAL HIGH (ref 70–99)
HCT: 46 % (ref 39.0–52.0)
Hemoglobin: 15.6 g/dL (ref 13.0–17.0)
Potassium: 4.4 mEq/L (ref 3.5–5.1)
Sodium: 137 mEq/L (ref 135–145)
TCO2: 25 mmol/L (ref 0–100)

## 2010-05-24 LAB — LACTIC ACID, PLASMA: Lactic Acid, Venous: 1 mmol/L (ref 0.5–2.2)

## 2010-05-24 LAB — URINE CULTURE: Colony Count: 45000

## 2010-05-24 LAB — URINE MICROSCOPIC-ADD ON

## 2010-05-24 LAB — HEMOGLOBIN A1C
Hgb A1c MFr Bld: 5.7 % (ref 4.6–6.1)
Mean Plasma Glucose: 117 mg/dL

## 2010-05-24 LAB — APTT
aPTT: 56 seconds — ABNORMAL HIGH (ref 24–37)
aPTT: 61 seconds — ABNORMAL HIGH (ref 24–37)

## 2010-05-24 LAB — ABO/RH: ABO/RH(D): A POS

## 2010-05-24 LAB — TSH: TSH: 1.027 u[IU]/mL (ref 0.350–4.500)

## 2010-05-24 LAB — POCT INR: INR: 2.3

## 2010-05-24 MED ORDER — ESOMEPRAZOLE MAGNESIUM 40 MG PO CPDR: 40.0000 mg | DELAYED_RELEASE_CAPSULE | Freq: Every day | ORAL | Status: DC

## 2010-05-24 NOTE — Telephone Encounter (Signed)
DR. Tena/CARDIOLOGIST CHANGED CRESTOR MEDS TO GENERIC FORM OF LIPITOR (DAILY) WANTS TO KNOW IF DR. TODD IS AWARE OF THIS?

## 2010-05-24 NOTE — Telephone Encounter (Signed)
Crestor change to Lipitor noted

## 2010-05-24 NOTE — Patient Instructions (Signed)
Same dose, 5 mg everyday

## 2010-06-21 ENCOUNTER — Other Ambulatory Visit (INDEPENDENT_AMBULATORY_CARE_PROVIDER_SITE_OTHER): Payer: Medicare Other | Admitting: Family Medicine

## 2010-06-21 DIAGNOSIS — E785 Hyperlipidemia, unspecified: Secondary | ICD-10-CM

## 2010-06-21 DIAGNOSIS — I82409 Acute embolism and thrombosis of unspecified deep veins of unspecified lower extremity: Secondary | ICD-10-CM

## 2010-06-21 LAB — POCT INR: INR: 2

## 2010-06-21 LAB — LIPID PANEL
Cholesterol: 142 mg/dL (ref 0–200)
HDL: 43.5 mg/dL (ref >39.00)
LDL Cholesterol: 69 mg/dL (ref 0–99)
Total CHOL/HDL Ratio: 3
Triglycerides: 148 mg/dL (ref 0.0–149.0)
VLDL: 29.6 mg/dL (ref 0.0–40.0)

## 2010-06-21 NOTE — Patient Instructions (Signed)
Same dose 

## 2010-06-23 ENCOUNTER — Other Ambulatory Visit: Payer: Medicare Other

## 2010-06-29 NOTE — Consult Note (Signed)
NAMEDARELD, MCAULIFFE NO.:  [id number]   MEDICAL RECORD NO.:  [id number]          PATIENT TYPE:  INP   LOCATION:  3313                         FACILITY:  [facility]   PHYSICIAN:  Angelia Mould. Derrell Lolling, M.D.DATE OF BIRTH:  28-Jan-1941   DATE OF CONSULTATION:  08/06/2008  DATE OF DISCHARGE:                                 CONSULTATION   CONSULTING SURGEON:  Angelia Mould. Derrell Lolling, MD   REQUESTING PHYSICIAN:  Michiel Cowboy, MD   PRIMARY CARE PHYSICIAN:  Eugenio Hoes. Tawanna Cooler, MD   CARDIOLOGIST:  Georga Hacking, MD   REASON FOR CONSULTATION:  Focal small-bowel inflammation and abdominal  pain.   HISTORY OF PRESENT ILLNESS:  William Norman is a 70 year old male patient,  known history of BPH, mild CAD, post PTCA in 1988 and again in 1992 as  well as prior DVT in 2002, on chronic Coumadin.  He presented to the ER  on Wednesday night with severe suprapubic abdominal pain.  On the exam,  he was also found to have mid abdominal pain as well.  CT scan revealed  wall thickening with phlegmon of the distal small bowel as well as the  fluid collection possibly an old seroma in the right inguinal region.  The patient's initial white count was 10,400 with neutrophils of 86%,  and he was admitted by the Internal Medicine Team, and started on  empiric antibiotic therapy, n.p.o. status, and pain management.   REVIEW OF SYSTEMS:  GI:  The patient has reported anorexia with nausea,  no vomiting since onset of symptoms this past Wednesday.  He reports his  symptoms began after he ran to his seat at a track meet and then sat for  4 hours straight.  When he arose to get up to leave, he noticed severe  suprapubic pain.  The pain gradually decreased, although the anorexia  persisted for some time.  Wednesday evening, he had tried to eat pizza  and then developed a worst pain that he had experienced since onset of  symptoms.  He has had no change in stool or no blood and no diarrhea.  GU:  The  patient denied any and scrotal swelling.  Since onset of  symptoms, he has had dysuria, which improves after emptying his bladder.  This has been going on since Wednesday.  No chronic symptoms.  CONSTITUTIONAL:  No fever.  He has had malaise.  No chills.   PAST MEDICAL HISTORY:  1. BPH.  2. Right DVT in the distal popliteal as well as a superficial femoral      vein in 2002.  3. CAD with PTCA in 1988 and 1992.  4. GERD.  5. Hypertension.   PAST SURGICAL HISTORY:  1. Tonsillectomy.  2. Appendectomy in childhood.  3. Right inguinal hernia repair.   SOCIAL HISTORY:  Former tobacco user, quit about 1998, drinks about 3  beers per week.  He is retired.   ALLERGIES:  NKDA.   FAMILY HISTORY:  Noncontributory.   CURRENT MEDICATIONS:  1. Nexium 40 mg daily.  2. Coumadin 5 mg daily.  3. Niaspan.  4. Crestor 20 mg daily.  5. Atenolol 100 mg daily.  6. Potassium chloride 20 mEq daily.  7. Lasix 20 mg daily.   PHYSICAL EXAMINATION:  GENERAL:  Pleasant male patient reporting lower  abdominal pain as described in the history of present illness, slightly  improved since coming to the hospital, and starting on antibiotics, IV  fluids, and IV pain medications.  VITAL SIGNS:  Temperature 98.8, BP 114/65, pulse 63, and respirations  20.  PSYCH:  The patient is alert and oriented x3.  Affect appropriate to  current situation.  NEUROLOGIC:  Cranial nerves II through XII are grossly intact.  He is  moving all extremities x4 without focal deficits.  HEENT:  Eyes, sclerae nonicteric and injected bilaterally.  Ears, nose  and throat, ears are symmetrical.  No otorrhea.  Nose is midline.  No  rhinorrhea.  Oral mucous membranes pink and moist.  CHEST:  Bilateral lung sounds are clear to auscultation anteriorly.  Respiratory effort is nonlabored.  He is on room air.  CARDIOVASCULAR:  Heart sounds S1 and S2.  No rubs, murmurs, thrills, or  gallops.  No JVD.  No peripheral edema.  Pulses regular  and  nontachycardic.  ABDOMEN:  Soft and slightly distended.  Bowel sounds are present and  normoactive.  He has no abdominal scars except for a low abdomen/groin  scar horizontal in nature consistent with prior right inguinal hernia  repair.  He is tender in the suprapubic area more to the right below the  right inguinal hernia scar.  There is a fullness and goose-egg type knot  like structure.  There that is exquisitely tender.  He is also tender in  the mid abdomen between the symphysis pubis and the umbilicus.  He is  more tender in this area that he has been in the groin.  EXTREMITIES:  Symmetrical in appearance without cyanosis or clubbing.   LABORATORY DATA:  White count from 6:30 this morning is 14,800, previous  white count was 10,400, this was at 1 a.m.  Current neutrophils are 87%,  hemoglobin 13.6, and platelets 200,000.  Sodium 136, potassium 4.2, CO2  of 22, glucose 139, BUN 18, and creatinine 1.08.  LFTs are normal.  Lactic acid at 3:40 a.m. was 1.0.  INR 2.3.  PT 27, and PTT 56.  Diagnostic CT as described in the history of present illness.  He also  has colonic diverticulum without evidence of diverticulitis, and again  there is not only the wall thickening and phlegmon in the distal small  bowel.  In the lower pelvis, there is a right inguinal hernia with fat  and small fluid collection.   IMPRESSION:  1. Abdominal and right groin pain due to incarcerated inguinal hernia.  2. Small bowel vs. large bowel inflammatory process possibly related      to ischemia secondary related to incarcerated inguinal hernia.  3. Coumadin for deep vein thrombosis with therapeutic INR.  4. History of coronary artery disease, prior percutaneous transluminal      coronary angioplasty currently asymptomatic.   PLAN:  1. The patient needs urgent surgical intervention and repair of      incarcerated right inguinal hernia.  He is at increased risk for      bowel perforation due to ischemia  or inflammation.  Risks and      benefits of undergoing procedure, including laparotomy have been      discussed with the patient per Dr. Derrell Lolling, the patient  agrees.  We      need to urgently reverse anticoagulation.  He will be given vitamin      K IV as well as 2 units of FFP quickly with stat PT, INR, and CBC      check after infusion of the blood products.  2. Continue Cipro and Flagyl.  3. Increase IV fluids to 150 an hour.       Allison L. Rennis Harding, N.P.      Angelia Mould. Derrell Lolling, M.D.  Electronically Signed    ALE/MEDQ  D:  08/07/2008  T:  08/07/2008  Job:  [id number]   cc:   Michiel Cowboy, MD  Eugenio Hoes Tawanna Cooler, MD  Georga Hacking, M.D.

## 2010-06-29 NOTE — Discharge Summary (Signed)
William Norman, STAILEY NO.:  [id number]   MEDICAL RECORD NO.:  [id number]          PATIENT TYPE:  INP   LOCATION:  5531                         FACILITY:  [facility]   PHYSICIAN:  Lonia Blood, M.D.       DATE OF BIRTH:  Mar 16, 1940   DATE OF ADMISSION:  08/07/2008  DATE OF DISCHARGE:  08/20/2008                               DISCHARGE SUMMARY   PRIMARY CARE PHYSICIAN:  Tinnie Gens A. Tawanna Cooler, MD, with [facility].   DISCHARGE DIAGNOSES:  1. Strangulated right inguinal hernia, status post repair.  2. Perforated sigmoid diverticulitis, status post sigmoid colectomy      and colostomy.  3. History of recurrent deep vein thrombosis, the patient can remember      at least 4 episodes in the past.  4. Gastroesophageal reflux disease.  5. Hypertension.  6. Hyperlipidemia.  7. Remote history of coronary artery disease with balloon angioplasty      in the 90s.  8. Postoperative ileus, resolved.   DISCHARGE MEDICATIONS:  1. Nexium 40 mg daily.  2. Coumadin 5 mg daily.  3. Niaspan 1000 mg at bedtime.  4. Crestor 20 mg daily.  5. Atenolol 100 mg daily.  6. Potassium chloride 20 mEq daily.  7. Vicodin 5/325 one to two every 4 hours as needed for pain.  8. Protonix 40 mg daily.   CONSULTATION THIS ADMISSION:  The patient was seen by Dr. Claud Kelp  from General Surgery and his partners.   PROCEDURE DURING THIS ADMISSION:  1. On June 24, 2,010, the patient underwent CT scan of abdomen and      pelvis with intravenous contrast with findings of sigmoid      diverticulosis with diverticulitis and strangulated right inguinal      hernia.  2. On August 07, 2008, procedure for repair of incarcerated right      inguinal hernia, sigmoid colectomy with colostomy by Dr. Claud Kelp.   HISTORY AND PHYSICAL:  Refer to the dictated H and P, which was done by  Dr. Adela Glimpse on August 07, 2008.   HOSPITAL COURSE:  1. Severe abdominal pain.  Mr. Odette presented to the  emergency      room with severe abdominal pain.  He was diagnosed with an      incarcerated right inguinal hernia, perforated sigmoid      diverticulitis, was taken emergently to the operating room and      underwent repair of his hernia as well as sigmoid resection and      colostomy.  Postoperatively, the patient did well on intravenous      antibiotics and symptomatic treatment.  He had a mild postoperative      ileus, which eventually resolved.  He has been advanced on a diet      and is currently able to tolerate a regular diet.  He also has been      educated about the use of his colostomy.  2. Hypertension, hyperlipidemia, coronary artery disease, and      gastroesophageal reflux disease.  Mr. Abalos has been seen on a      daily basis by the Medicine Service with the necessary adjustments      being done depending on the postoperative status.  By the time of      discharge, the patient is able to resume all his home medications.  3. History of recurrent deep venous thrombosis.  Prior to surgery, Mr.      Winsett was taken off his Coumadin and then once he is stabilized      postoperatively, he was resumed.  Discharge INR is 2.0 on 5 mg of      Coumadin.  The initial history and physical mentions the fact that      the patient may not need Coumadin, but I strongly disagree with      that as he had at least 4 episodes of deep venous thrombosis in the      past.  As far as I am concerned for now, this patient needs a      lifelong anticoagulation.   FOLLOWUP:  Mr. Harmes will follow up with Dr. Kelle Darting on Friday,  August 22, 2008, for Coumadin check and he will follow up with Dr. Claud Kelp for his postoperative care in 1-2 weeks.      Lonia Blood, M.D.  Electronically Signed     SL/MEDQ  D:  08/20/2008  T:  08/20/2008  Job:  [id number]   cc:   Tinnie Gens A. Tawanna Cooler, MD  Angelia Mould. Derrell Lolling, M.D.

## 2010-06-29 NOTE — Op Note (Signed)
William Norman, William Norman NO.:  [id number]   MEDICAL RECORD NO.:  [id number]          PATIENT TYPE:  INP   LOCATION:  3313                         FACILITY:  [facility]   PHYSICIAN:  Angelia Mould. Derrell Lolling, M.D.DATE OF BIRTH:  January 15, 1941   DATE OF PROCEDURE:  08/07/2008  DATE OF DISCHARGE:                               OPERATIVE REPORT   PREOPERATIVE DIAGNOSIS:  Strangulated right inguinal hernia.   POSTOPERATIVE DIAGNOSES:  1. Perforated sigmoid diverticulitis.  2. Incarcerated right inguinal hernia containing preperitoneal fat      only.   OPERATIVE PROCEDURE  1. Sigmoid colectomy with Colostomy ([place] resection)  2. Repair incarcerated inguinal hernia.   OPERATIVE INDICATIONS:  This is a 70 year old white man who has a  history of right inguinal hernia repair some 20 years ago.  He is known  that he had a recurrent right inguinal hernia for 5 or 6 years, but has  elected not to have this repaired.  He presented to the emergency room  last night with severe suprapubic pain, abdominal pain, and right groin  pain.  On exam, he has peritoneal signs with tenderness and guarding  throughout the entire lower abdomen, is somewhat obese and distended.  He also has an exquisitely tender 5-cm mass in his right groin, but  there is no inflammation of the groin or the scrotum or penis.  CT scan  shows an inflamed loop of bowel, was read to be small bowel, and also  there appears to be a right groin mass, but they really could not tell  whether this contained bowel or not.  The patient was brought to the  operating room emergently following correction of his coagulation status  with fresh frozen plasma and vitamin K, as he is a chronic Coumadin user  because of deep venous thrombosis.   OPERATIVE FINDINGS:  In the abdomen, the patient had ruptured sigmoid  diverticulitis with lots of purulent fluid and exudate on the small  bowel, but there was no primary small bowel process.   There was a focal  area of acute diverticulitis in the midsigmoid colon.  We were able to  resect this with the distal stump being well above the peritoneal  reflection and perform a colostomy on the left side.  In the right groin  when we explored this, he had a large hernia sac coming out beside the  cord structures.  The external oblique did not appear to have been  closed, or possibly he had a Arboriculturist many years ago. I did not  see any mesh.  The hernia sac was simply a very thick-walled hernia sac  containing some fluid, which was presumably irritated from the  inflammatory fluid and a little bit of preperitoneal fat.   OPERATIVE TECHNIQUE:  Following the induction of general endotracheal  anesthesia, the patient's abdomen, right groin, and genitalia were  prepped and draped in sterile fashion.  He had received antibiotics.  Surgical time-out was held identifying correct patient and correct  procedure.   A lower midline laparotomy incision was made from the suprapubic  area  and ultimately, this had to be extended up and around the umbilicus for  exposure because of his obesity.  The fascia was incised in the midline.  The abdomen was entered and explored with findings as described above.  We ran the small bowel from the ligament of Treitz all the way to the  ileocecal valve.  We identified the area of sigmoid diverticulitis.  We  did not see any small bowel or large bowel incarcerated in the right  inguinal hernia.  We could pass our finger down and found a small defect  in the fascia, but it was no more than 1 cm in size.   We elected to explore the right groin next and made an oblique incision  in the right groin overlying the palpable mass.  Dissection was carried  down through the subcutaneous tissue and we encountered the hernia sac.  We did not see the external oblique at all.  We dissected the hernia sac  away from the surrounding tissues.  We isolated the cord  structures and  encircled them with a Penrose drain.  I made a small incision in the  fascia around the hernia sac.  Ultimately, I could pass my finger from  the abdominal cavity through the defect in the abdominal wall into the  hernia sac.  We then opened the hernia sac and found no other  abnormalities.  We carefully debrided the hernia sac circumferentially.   We then had one of the nurses inflate the bladder with about 350 mL of  saline until the bladder was quite tight and there was no leakage of  urine from the peritoneal side or from the groin side.  We therefore  felt that there was no injury to the bladder in anyway.  We then emptied  the bladder.   We closed the peritoneum of the hernia sac with a pursestring suture of  2-0 Vicryl.  We then simply closed the abdominal wall muscle with  multiple interrupted sutures of #1 Novafil.  We did not feel comfortable  placing any kind of mesh in this place.  Since the hernia defect was so  small and we closed this well, we felt that this would be adequate,  although he would have a slightly increased risk of recurrence.  We  irrigated the right groin wound out quite well.  We closed the  subcutaneous tissue with interrupted 2-0 Vicryl and the skin with skin  staples.   We then went back to the abdominal cavity, placed self-retaining  retractors.  We ran the small bowel one more time from the ligament of  Treitz all the way to the ileocecal valve, saw some exudate on part of  the small bowel, but there was no primary disease process of the small  bowel.  We then packed the small bowel away.  We mobilized the diseased  sigmoid colon by dividing its lateral peritoneal attachments.  We  identified the focal inflamed area of sigmoid colon.  We cleaned off the  mesentery just proximal to this and divided the sigmoid colon with a GIA  stapling device.  We then mobilized the diseased distal segment by  dividing the mesentery close to the  colon, staying well away from the  retroperitoneum.  We divided some of the mesentery with LigaSure device  and some of the mesenteric vessels were clamped, divided, and ligated  with 2-0 silk ties and 2-0 silk suture ligatures.  We then isolated the  distal colon below this area and skeletonized it away from the colon  mesentery.  We then divided the distal colon with the contour stapling  device and removed the specimen.  We marked the distal margin of the  specimen with silk suture and sent it to the lab.  We also sent the  hernia sac to the lab.   We placed two 2-0 Prolene sutures at the corners of the staple line of  the rectal stump.  The rectal stump was several centimeters above the  peritoneal reflection behind the bladder.  We irrigated out the entire  abdomen; subphrenic spaces, abdominal space, and pelvic area at this  time.  We mobilized the sigmoid colon somewhat more by dividing its  lateral peritoneal attachments until we felt we could bring it to the  abdominal wall.  We found a suitable area on the left abdominal wall for  the colostomy.  We cut out a circular button of skin.  We debrided the  fat.  We incised the anterior rectus sheath in a cruciate fashion.  We  split the rectus muscles and divided the posterior rectus sheath and  then dilated this until two fingers would go through it.  We placed a  Babcock clamp to the abdominal wall.  We placed the colon carefully in  this making sure that there was no twisting.  We then carefully pushed  it from within outward until it would come up into the wound just above  the level of the skin.  It looked pink and viable.   We irrigated out one more time.  There was no bleeding in the abdomen.  We placed a 19-French Blake drain down in the pelvis and brought it out  through a separate stab incision in the right lower quadrant, sutured  the drain to the skin with nylon suture, and connected to suction bulb.  We returned the  small bowel and the omentum to their anatomic positions.  Midline fascia was closed carefully with a running, double-stranded #1  PDS suture.  The wound was irrigated.  The skin was closed loosely with  staples and we placed Telfa wicks in between the staples for drainage.   We then amputated the staple line on the colostomy and matured the  colostomy with about 12 interrupted sutures of 3-0 Vicryl.  The  colostomy bled well, was pink and viable.  I could pass my finger  through the colostomy into the peritoneal space and there was no  obstruction.  Clean bandages were placed and a colostomy bag was placed.  The patient tolerated the procedure well, was taken to the recovery room  in stable condition.  Estimated blood loss was about 500 mL.   COMPLICATIONS:  None.   COUNTS:  Sponge, needle, and instrument counts were correct.      Angelia Mould. Derrell Lolling, M.D.  Electronically Signed     HMI/MEDQ  D:  08/07/2008  T:  08/08/2008  Job:  [id number]   cc:   Tinnie Gens A. Tawanna Cooler, MD  Michiel Cowboy, MD

## 2010-06-29 NOTE — H&P (Signed)
NAMEGHAZI, William Norman NO.:  [id number]   MEDICAL RECORD NO.:  [id number]          PATIENT TYPE:  INP   LOCATION:  4715                         FACILITY:  [facility]   PHYSICIAN:  Michiel Cowboy, MDDATE OF BIRTH:  04-22-1940   DATE OF ADMISSION:  08/07/2008  DATE OF DISCHARGE:                              HISTORY & PHYSICAL   PRIMARY CARE PHYSICIAN:  Dr. Tawanna Cooler.   CHIEF COMPLAINT:  Abdominal pain.   HISTORY OF PRESENT ILLNESS:  The patient is a 70 year old gentleman with  history of hypertension, recurrent DVTs and GERD.  Patient, for the past  one week, had been having intermittent abdominal pain, nausea and  occasional dry heaves.  No fevers.  He had no appetite for the past two  days.  Yesterday, he finally got some appetite, so he decided to go have  a pizza.  He ordered a pizza with hot sausage, and as soon as he ate it  he started having further significant abdominal pain.  He was severe  enough to come into the emergency department.  He was noted to be  febrile up to 102.6.  Antibiotics was started and CT scan showed small  bowel inflammation with no abscess or perforation.  Medicine was called  for admission and surgery was called for consult at which point Dr.  Lindie Spruce felt that the patient most likely does not have an acute surgical  indication, but they will continue antibiotics and continue to monitor  him.  So, we will see him this morning.   Otherwise, no constipation or diarrhea.  The patient's last bowel  movement was yesterday.  He did not know of any fever he had at home.  No chills, no shortness of breath.  No chest pain.  Otherwise, review of  systems unremarkable.   PAST MEDICAL HISTORY:  1. Recurrent DVTs.  Last one was in 1990s.  He continues to be on      Coumadin.  2. Gastroesophageal reflux disease.  3. Hypercholesterolemia.  4. Hypertension.  5. Coronary artery disease status post angioplasty in 1991.  He is      followed by Dr.  Donnie Aho.  6. Inguinal hernias.  One of his hernias was repaired in the past.   SOCIAL HISTORY:  The patient used to smoke but quit in 1990s, drinks  about three alcohol drinks a week, does not use drugs.  Has very  supportive family.   FAMILY HISTORY:  Noncontributory.   ALLERGIES:  No known drug allergies.   MEDICATIONS:  1. Nexium 40 mg daily.  2. Coumadin 5 mg daily.  3. Niaspan, dose unknown.  4. Crestor 20 mg at bedtime.  5. Atenolol 100 mg once a day.  6. Potassium 20 mEq daily.  7. Lasix 40 mg daily, but he is not sure what dose.   PHYSICAL EXAMINATION:  VITAL SIGNS:  T-max 102.6, blood pressure 139/80,  pulse 80, respirations 17, sating 92-95% on room air.  GENERAL:  The patient appears to be currently in no acute distress.  HEENT:  Normocephalic.  Somewhat dry, mucous membranes.  LUNGS:  Clear to auscultation bilaterally.  HEART:  Regular rate and rhythm.  No murmurs, rubs or gallops.  ABDOMEN:  Slightly distended, diminished bowel sounds noted.  There is  some guarding and some point tenderness over the right lower quadrant  noted.  Some peritoneal signs.  EXTREMITIES:  Lower extremities without cyanosis, clubbing or edema.  NEUROLOGICAL:  Intact.  SKIN:  Intact.   LABORATORY DATA:  White blood cell count 10.4, hemoglobin 15.6.  Sodium  137, potassium 4.4, creatinine 0.1, INR 2.1.  UA showing 3-6 white blood  cells and 7-10 red blood cells and few bacteria.  CT scan of the abdomen  and pelvis showing diverticulosis as well as still small bowel loop  inflammation in phlegmon but no evidence of abscess.  No extraluminal  gas noted.  There is a seroma on the left and inguinal hernia.  Of note,  they did discuss his appendix as being normal in the right mid abdomen.   EKG showed normal sinus rhythm, no ischemia.   ASSESSMENT/PLAN:  This is a 70 year old gentleman with small bowel  inflammation, question of mild UTI, being admitted for the antibiotics.  1. Small bowel  inflammation.  Per Surgery, will treat as a      diverticulitis, will make n.p.o.  Give Cipro and Flagyl, IV fluids.      Continue to follow.  Surgery will see him.  The patient's care was      discussed with Dr. Lindie Spruce.  2. History of deep vein thrombosis.  This was remote.  In case the      patient may need to undergo surgery, will hold Coumadin for now.      His last DVT was very remote.  Question if he would need to be on      heparin until once his INR becomes subtherapeutic.  3. History of GERD.  Continue Protonix.  4. Hypertension.  Hold Lasix for now, continue Atenolol.  5. Questionable UTI, mild, but should be covered by Cipro anyway.      Will write urine culture.  6. Prophylaxis, Protonix, plus the patient is still therapeutic on      Coumadin.  Once becomes subtherapeutic will do SCDs until the      patient will not need the operation.  After that, if no need for      operation, his Coumadin likely could be restarted.      Michiel Cowboy, MD  Electronically Signed     AVD/MEDQ  D:  08/07/2008  T:  08/07/2008  Job:  [id number]   cc:   Tinnie Gens A. Tawanna Cooler, MD

## 2010-07-02 NOTE — H&P (Signed)
[facility]  Patient:    William Norman, William Norman Visit Number: [id number] MRN: [id number]          Service Type: Attending:  Tera Mater. Clent Ridges, M.D. Dictated by:   Tera Mater Clent Ridges, M.D.                           History and Physical  DATE OF BIRTH:  [birth year]  CHIEF COMPLAINT:  A 70 year old man with pain and swelling in the left calf.  HISTORY OF PRESENT ILLNESS:  Four days ago, the patient did a lot of strenuous work in his yard.  Then the next day he spent the whole day sitting in his truck driving.  The next day, he developed some swelling and pain in his left calf.  Now for the past three days, he has had progressive pain and swelling in the left calf.  No history of trauma.  No chest pain, shortness of breath, or any other symptoms.  PAST MEDICAL HISTORY:  Notable for a deep venous thrombosis in his right leg in 1988 after he had an angioplasty procedure.  He has been on daily aspirin since then.  Otherwise the history is positive for coronary artery disease. He is status post PTCA twice, once in 1988 and again in 1992.  He sees W. Viann Fish, Montez Hageman., M.D., on a regular basis for cardiology care.  He has hyperlipidemia, hypertension, GE reflux disease, and benign prostatic hypertrophy.  He has had a tonsillectomy and adenoidectomy as a child.  He has had a right inguinal hernia repair.  He had a Cardiolite treadmill evaluation in April of 2000, which was negative for ischemia.  SOCIAL HISTORY:  He is married with several children.  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  HABITS:  He quit smoking in 1995.  He does not use alcohol.  FAMILY HISTORY:  Remarkable for hypertension and heart disease.  ALLERGIES:  None.  CURRENT MEDICATIONS: 1. Atenolol 50 mg per day. 2. Zocor 10 mg per day. 3. Aspirin 325 mg per day. 4. Lasix 20 mg per day. 5. K-Dur 20 mEq per day.  PHYSICAL EXAMINATION:  He is in no acute distress.  VITAL SIGNS:  BP 120/90,  respirations 12 and comfortable, pulse 70 and regular, temperature 98.2 degrees.  WEIGHT:  233 pounds.  HEENT:  Unremarkable.  NECK:  Supple without masses.  LUNGS:  Clear.  CARDIAC:  Regular rate and rhythm without murmurs, rubs, or gallops.  Distal pulses are full.  ABDOMEN:  Soft.  Normal bowel sounds.  Nontender.  No masses.  EXTREMITIES:  Remarkable for swelling, redness, slight warmth, and tenderness in the left calf.  Homans sign is negative.  No cords are palpable.  He is not tender in the popliteal space.  NEUROLOGIC:  Exam is grossly intact.  The patient was sent for a venous Doppler evaluation become coming back to the office.  This was positive for deep venous thrombosis involving the distal superficial femoral vein, the distal popliteal vein, and the distal medial calf vein.  ASSESSMENT AND PLAN:  Deep venous thrombosis in the left calf.  The patient will be admitted after baseline labs are drawn, including PT and PTT.  Twice daily injections of Lovenox will begin immediately as he is worked up to therapeutic levels on Coumadin.  Since he has had thrombotic events twice now even on aspirin therapy, I think a full hypercoagulability work-up is warranted as well. Dictated by:  Tera Mater. Clent Ridges, M.D. Attending:  Tera Mater. Clent Ridges, M.D. DD:  01/05/01 TD:  01/06/01 Job: [id number]

## 2010-07-02 NOTE — Discharge Summary (Signed)
[facility]  Patient:    William Norman, William Norman Visit Number: [id number] MRN: [id number]          Service Type: MED Location: [phone] Attending Physician:  Nelwyn Salisbury Dictated by:   Titus Dubin. Alwyn Ren, M.D. [facility] Admit Date:  01/05/2001 Discharge Date: 01/07/2001   CC:         Evette Georges, M.D. [facility] [place] - Cherylann Banas., M.D.   Discharge Summary  ADMITTING DIAGNOSIS:  Deep venous thrombosis, distal popliteal medial and superficial femoral veins.  OPERATIONS:  None.  BRIEF HISTORY:  Mr. Duris presented with four days of swelling and pain on the left calf.  Four days ago, he spent an entire day in his truck. Additionally, a month ago, he was off aspirin for 10 days for a colonoscopy and polypectomy.  Significantly, he had deep venous thrombosis on his right leg in 1988 after angioplasty.  He has had angioplasty twice in 1988 and 1992 and is followed by Dr. Viann Fish.  Apparently, he had a Cardiolite which was negative in April 2000.  Other medical issues have included hypercholesterolemia, hypertension, prostatic hypertrophy, and esophageal reflux.  He quit smoking in 1995 and does not drink.  Outpatient medicines included atenolol 50 mg daily, Zocor 10 mg daily, enteric-coated aspirin 325 mg daily, Lasix 20 mg daily, and potassium 20 mg daily.  Significant family history includes pulmonary thromboembolism in his grandfather.  His father had varicose veins.  HOSPITAL COURSE:  He was admitted following the ultrasound results as an outpatient.  He was placed on Lovenox and Coumadin initiated. Symptomatically, there was decreased pain and swelling on the left calf.  He was instructed in self-administration of the Lovenox.  He remained asymptomatic from a cardiopulmonary standpoint with no chest pain or shortness of breath.  He stated that the tenderness in the calf had become more localized and was markedly  improved.  On the day of discharge, he had a temperature of 99.4, pulse rate of 65, and respiratory rate of 18 to 20.  Blood pressure was 136/79.  Chest was clear and he had a regular rhythm.  There was localized tenderness to compression on the left calf.  INR was 1.0 after one dose of Coumadin 7.5 mg daily.  Arrangements were made for outpatient self-administration of the Lovenox for five days following discharge.  He was to take Coumadin 7.5 mg on Sunday, November 24 and Monday, November 25, and then have a prothrombin time checked at Dr. Barbette Or office on Tuesday, November 26.  He was instructed to take his Coumadin in the evening at approximately 5:00 so adjustments could be made in his dose if the INRs were excessively high.  As this is his second episode of deep venous thrombosis and now has involved both lower extremities and he has underlying venous insufficiency, lifelong anticoagulation would probably be advisable.  He was discharged with no change in his home medicines with the exception of stopping the enteric-coated aspirin.  He will continue Nexium for esophageal reflux symptoms.  He has been asked to monitor his stools for black or tarry stool, or blood in his stool, and his urine for any traces of blood.  If he sees these, he should contact Dr. Tawanna Cooler immediately.  He has been asked to monitor PTs as noted above; follow-up by Dr. Tawanna Cooler which should be in seven to 10 days or sooner should he have any symptoms or signs.  Discharge status is improved.  Prognosis is good as he has no significant comorbidities other than those listed in the history and is asymptomatic from a cardiopulmonary standpoint. Dictated by:   Titus Dubin. Alwyn Ren, M.D. [facility] Attending Physician:  Nelwyn Salisbury DD:  01/07/01 TD:  01/07/01 Job: [id number]

## 2010-07-08 ENCOUNTER — Ambulatory Visit: Payer: Medicare Other | Admitting: Family Medicine

## 2010-07-20 ENCOUNTER — Ambulatory Visit: Payer: Medicare Other | Admitting: Family Medicine

## 2010-11-21 ENCOUNTER — Other Ambulatory Visit: Payer: Self-pay | Admitting: Family Medicine

## 2010-11-26 ENCOUNTER — Other Ambulatory Visit: Payer: Self-pay | Admitting: Family Medicine

## 2010-11-26 DIAGNOSIS — K219 Gastro-esophageal reflux disease without esophagitis: Secondary | ICD-10-CM

## 2010-11-26 MED ORDER — ESOMEPRAZOLE MAGNESIUM 40 MG PO CPDR: 40.0000 mg | DELAYED_RELEASE_CAPSULE | Freq: Every day | ORAL | Status: DC

## 2010-11-26 NOTE — Telephone Encounter (Signed)
rx sent in electronically 

## 2010-11-26 NOTE — Telephone Encounter (Signed)
Pt came by office and said that [facility] [address] sent over a refill req for pts Nexium, but was told that it had been Denied by Dr Tawanna Cooler. Pt needs refill of NEXIUM 40 MG capsule 90 day supply [facility] [address].

## 2011-03-02 ENCOUNTER — Other Ambulatory Visit: Payer: Self-pay | Admitting: *Deleted

## 2011-03-02 DIAGNOSIS — K219 Gastro-esophageal reflux disease without esophagitis: Secondary | ICD-10-CM

## 2011-03-02 MED ORDER — WARFARIN SODIUM 5 MG PO TABS: 5.0000 mg | ORAL_TABLET | Freq: Every day | ORAL | Status: DC

## 2011-03-02 MED ORDER — ATENOLOL 100 MG PO TABS: 100.0000 mg | ORAL_TABLET | Freq: Every day | ORAL | Status: DC

## 2011-03-02 MED ORDER — ESOMEPRAZOLE MAGNESIUM 40 MG PO CPDR: 40.0000 mg | DELAYED_RELEASE_CAPSULE | Freq: Every day | ORAL | Status: DC

## 2011-04-13 ENCOUNTER — Encounter: Payer: Self-pay | Admitting: Family Medicine

## 2011-04-14 ENCOUNTER — Ambulatory Visit (INDEPENDENT_AMBULATORY_CARE_PROVIDER_SITE_OTHER): Payer: Medicare Other | Admitting: Family Medicine

## 2011-04-14 ENCOUNTER — Encounter: Payer: Self-pay | Admitting: Family Medicine

## 2011-04-14 DIAGNOSIS — N401 Enlarged prostate with lower urinary tract symptoms: Secondary | ICD-10-CM

## 2011-04-14 DIAGNOSIS — R609 Edema, unspecified: Secondary | ICD-10-CM

## 2011-04-14 DIAGNOSIS — J309 Allergic rhinitis, unspecified: Secondary | ICD-10-CM

## 2011-04-14 DIAGNOSIS — N139 Obstructive and reflux uropathy, unspecified: Secondary | ICD-10-CM

## 2011-04-14 DIAGNOSIS — K219 Gastro-esophageal reflux disease without esophagitis: Secondary | ICD-10-CM

## 2011-04-14 DIAGNOSIS — N138 Other obstructive and reflux uropathy: Secondary | ICD-10-CM

## 2011-04-14 DIAGNOSIS — D68318 Other hemorrhagic disorder due to intrinsic circulating anticoagulants, antibodies, or inhibitors: Secondary | ICD-10-CM

## 2011-04-14 DIAGNOSIS — N529 Male erectile dysfunction, unspecified: Secondary | ICD-10-CM

## 2011-04-14 DIAGNOSIS — K5730 Diverticulosis of large intestine without perforation or abscess without bleeding: Secondary | ICD-10-CM

## 2011-04-14 DIAGNOSIS — M5137 Other intervertebral disc degeneration, lumbosacral region: Secondary | ICD-10-CM

## 2011-04-14 DIAGNOSIS — I2510 Atherosclerotic heart disease of native coronary artery without angina pectoris: Secondary | ICD-10-CM

## 2011-04-14 DIAGNOSIS — E785 Hyperlipidemia, unspecified: Secondary | ICD-10-CM

## 2011-04-14 DIAGNOSIS — I10 Essential (primary) hypertension: Secondary | ICD-10-CM

## 2011-04-14 DIAGNOSIS — Z0000 Encounter for general adult medical examination without abnormal findings: Secondary | ICD-10-CM

## 2011-04-14 LAB — CBC WITH DIFFERENTIAL/PLATELET
Basophils Absolute: 0.1 10*3/uL (ref 0.0–0.1)
Basophils Relative: 1 % (ref 0.0–3.0)
Eosinophils Absolute: 0.2 10*3/uL (ref 0.0–0.7)
Eosinophils Relative: 1.9 % (ref 0.0–5.0)
HCT: 40.5 % (ref 39.0–52.0)
Hemoglobin: 13.2 g/dL (ref 13.0–17.0)
Lymphocytes Relative: 15.8 % (ref 12.0–46.0)
Lymphs Abs: 1.4 10*3/uL (ref 0.7–4.0)
MCHC: 32.5 g/dL (ref 30.0–36.0)
MCV: 82.4 fl (ref 78.0–100.0)
Monocytes Absolute: 0.9 10*3/uL (ref 0.1–1.0)
Monocytes Relative: 11 % (ref 3.0–12.0)
Neutro Abs: 6 10*3/uL (ref 1.4–7.7)
Neutrophils Relative %: 70.3 % (ref 43.0–77.0)
Platelets: 278 10*3/uL (ref 150.0–400.0)
RBC: 4.91 Mil/uL (ref 4.22–5.81)
RDW: 16.2 % — ABNORMAL HIGH (ref 11.5–14.6)
WBC: 8.5 10*3/uL (ref 4.5–10.5)

## 2011-04-14 LAB — POCT URINALYSIS DIPSTICK
Bilirubin, UA: NEGATIVE
Glucose, UA: NEGATIVE
Ketones, UA: NEGATIVE
Leukocytes, UA: NEGATIVE
Nitrite, UA: NEGATIVE
Protein, UA: NEGATIVE
Spec Grav, UA: 1.02
Urobilinogen, UA: 0.2
pH, UA: 5.5

## 2011-04-14 LAB — BASIC METABOLIC PANEL
BUN: 20 mg/dL (ref 6–23)
CO2: 27 mEq/L (ref 19–32)
Calcium: 9.5 mg/dL (ref 8.4–10.5)
Chloride: 105 mEq/L (ref 96–112)
Creatinine, Ser: 0.9 mg/dL (ref 0.4–1.5)
GFR: 88.34 mL/min (ref >60.00)
Glucose, Bld: 82 mg/dL (ref 70–99)
Potassium: 4.9 mEq/L (ref 3.5–5.1)
Sodium: 139 mEq/L (ref 135–145)

## 2011-04-14 LAB — LIPID PANEL
Cholesterol: 110 mg/dL (ref 0–200)
HDL: 38.9 mg/dL — ABNORMAL LOW (ref >39.00)
LDL Cholesterol: 58 mg/dL (ref 0–99)
Total CHOL/HDL Ratio: 3
Triglycerides: 64 mg/dL (ref 0.0–149.0)
VLDL: 12.8 mg/dL (ref 0.0–40.0)

## 2011-04-14 LAB — TSH: TSH: 2.53 u[IU]/mL (ref 0.35–5.50)

## 2011-04-14 LAB — HEPATIC FUNCTION PANEL
ALT: 18 U/L (ref 0–53)
AST: 20 U/L (ref 0–37)
Albumin: 3.4 g/dL — ABNORMAL LOW (ref 3.5–5.2)
Alkaline Phosphatase: 50 U/L (ref 39–117)
Bilirubin, Direct: 0.1 mg/dL (ref 0.0–0.3)
Total Bilirubin: 0.3 mg/dL (ref 0.3–1.2)
Total Protein: 6.2 g/dL (ref 6.0–8.3)

## 2011-04-14 LAB — POCT INR: INR: 2.9

## 2011-04-14 LAB — PSA: PSA: 2.14 ng/mL (ref 0.10–4.00)

## 2011-04-14 MED ORDER — WARFARIN SODIUM 5 MG PO TABS: 5.0000 mg | ORAL_TABLET | Freq: Every day | ORAL | Status: DC

## 2011-04-14 MED ORDER — ATORVASTATIN CALCIUM 20 MG PO TABS: 20.0000 mg | ORAL_TABLET | Freq: Every day | ORAL | Status: DC

## 2011-04-14 MED ORDER — OXYMETAZOLINE HCL 0.05 % NA SOLN: 2.0000 | Freq: Two times a day (BID) | NASAL | Status: DC

## 2011-04-14 MED ORDER — HYDROCHLOROTHIAZIDE 25 MG PO TABS: 25.0000 mg | ORAL_TABLET | Freq: Every day | ORAL | Status: DC

## 2011-04-14 MED ORDER — LISINOPRIL 40 MG PO TABS: 40.0000 mg | ORAL_TABLET | Freq: Every day | ORAL | Status: DC

## 2011-04-14 MED ORDER — ATENOLOL 100 MG PO TABS: 100.0000 mg | ORAL_TABLET | Freq: Every day | ORAL | Status: DC

## 2011-04-14 MED ORDER — NITROGLYCERIN 0.4 MG SL SUBL: 0.4000 mg | SUBLINGUAL_TABLET | SUBLINGUAL | Status: DC | PRN

## 2011-04-14 MED ORDER — SILDENAFIL CITRATE 100 MG PO TABS: 100.0000 mg | ORAL_TABLET | Freq: Every day | ORAL | Status: DC | PRN

## 2011-04-14 NOTE — Progress Notes (Signed)
Subjective:    Patient ID: William Norman, male    DOB: [birth year], 71 y.o.   MRN: [id number]  HPI [name] is today a 71 year old married male nonsmoker who comes in today for a Medicare wellness examination  He is a history of hypertension hyperlipidemia coronary disease allergic rhinitis chronic Coumadin therapy and erectile dysfunction  Medications reviewed there's been no change see med list  He's currently taking Motrin 800 mg daily for hand pain he has seen the hand surgeon Dr. Merlyn Lot. They feel he has carpal tunnel syndrome. He was cautioned about taking anti-inflammatories with the lisinopril on board.  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 He does have guns in the house but her locked up. He does have a health care power of attorney and living will    Review of Systems  Constitutional: Negative.   HENT: Negative.   Eyes: Negative.   Respiratory: Negative.   Cardiovascular: Negative.   Gastrointestinal: Negative.   Genitourinary: Negative.   Musculoskeletal: Positive for arthralgias.  Skin: Negative.   Neurological: Negative.   Hematological: Negative.   Psychiatric/Behavioral: Negative.        Objective:   Physical Exam  Constitutional: He is oriented to person, place, and time. He appears well-developed and well-nourished.  HENT:  Head: Normocephalic and atraumatic.  Right Ear: External ear normal.  Left Ear: External ear normal.  Nose: Nose normal.  Mouth/Throat: Oropharynx is clear and moist.  Eyes: Conjunctivae and EOM are normal. Pupils are equal, round, and reactive to light.  Neck: Normal range of motion. Neck supple. No JVD present. No tracheal deviation present. No thyromegaly present.  Cardiovascular: Normal rate,  regular rhythm, normal heart sounds and intact distal pulses.  Exam reveals no gallop and no friction rub.   No murmur heard.      No carotid bruits  Pulmonary/Chest: Effort normal and breath sounds normal. No stridor. No respiratory distress. He has no wheezes. He has no rales. He exhibits no tenderness.  Abdominal: Soft. Bowel sounds are normal. He exhibits no distension and no mass. There is no tenderness. There is no rebound and no guarding.  Genitourinary: Rectum normal and penis normal. Guaiac negative stool. No penile tenderness.       3+ symmetrical BPH nonnodular  Musculoskeletal: Normal range of motion. He exhibits no edema and no tenderness.  Lymphadenopathy:    He has no cervical adenopathy.  Neurological: He is alert and oriented to person, place, and time. He has normal reflexes. No cranial nerve deficit. He exhibits normal muscle tone.  Skin: Skin is warm and dry. No rash noted. No erythema. No pallor.       Total body skin exam normal except for a plaque lesion left face advised to return for removal  Psychiatric: He has a normal mood and affect. His behavior is normal. Judgment and thought content normal.          Assessment & Plan:  Healthy male  Hypertension continue current medication  Hyperlipidemia continue current meds  Coronary disease asymptomatic followup by cardiology and a couple weeks  Chronic Coumadin therapy  Erectile dysfunction continue Viagra when necessary  Allergic rhinitis continue OTC antihistamine and nasal spray  Advised to take only small amounts of Motrin 400 twice a day and  no more for joint pain because of the potential interaction between NSAIDs and lisinopril  Carpal tunnel syndrome bilaterally followup with Dr. Merlyn Lot

## 2011-04-14 NOTE — Patient Instructions (Addendum)
Continue your current medications  Limit Motrin to no more than 400 mg twice daily with food  Return in one year sooner if any problems  Hydrochlorothiazide 25 mg every morning  .   Latest dosing instructions   Total Sun Mon Tue Wed Thu Fri Sat   35 5 mg 5 mg 5 mg 5 mg 5 mg 5 mg 5 mg    (5 mg1) (5 mg1) (5 mg1) (5 mg1) (5 mg1) (5 mg1) (5 mg1)

## 2011-04-18 ENCOUNTER — Telehealth: Payer: Self-pay | Admitting: Family Medicine

## 2011-04-18 NOTE — Telephone Encounter (Signed)
Yes and discuss with y Environmental health practitioner asap

## 2011-04-18 NOTE — Telephone Encounter (Addendum)
Pt has hand surgery on 04-21-2011. Should he stop coumadin?

## 2011-04-19 ENCOUNTER — Encounter: Payer: Self-pay | Admitting: Family Medicine

## 2011-04-19 ENCOUNTER — Ambulatory Visit (INDEPENDENT_AMBULATORY_CARE_PROVIDER_SITE_OTHER): Payer: Medicare Other | Admitting: Family Medicine

## 2011-04-19 DIAGNOSIS — I10 Essential (primary) hypertension: Secondary | ICD-10-CM

## 2011-04-19 DIAGNOSIS — G5600 Carpal tunnel syndrome, unspecified upper limb: Secondary | ICD-10-CM

## 2011-04-19 DIAGNOSIS — G5603 Carpal tunnel syndrome, bilateral upper limbs: Secondary | ICD-10-CM | POA: Insufficient documentation

## 2011-04-19 NOTE — Telephone Encounter (Signed)
Left detailed message on machine.

## 2011-04-19 NOTE — Patient Instructions (Signed)
Call Dr. Merrilee Seashore office tomorrow and confirm your surgery for Thursday morning  They should check a protime on Thursday morning prior to surgery  Check a blood pressure daily in the morning  Return in 2 weeks for followup  Per your request we will check a uric acid level however I do not think it's gout

## 2011-04-19 NOTE — Progress Notes (Signed)
  Subjective:    Patient ID: William Norman, male    DOB: 08/19/1940, 71 y.o.   MRN: [id number]  HPI William Norman is a 71 year old male who walks in this afternoon for evaluation of pain in both hands  He's had a complete evaluation by Dr. Merlyn Norman and is scheduled for surgery this Thursday for carpal tunnel syndrome on one hand. His last dose of Coumadin was Sunday night. I recommend that he have a pro time prior to surgery on Thursday morning and he is coming hematological s between now and then.  He states last night he slept with his hand pain over the bed and not today his hand is swollen.  His wife also wants to get a uric acid level she thinks he has gout and not carpal tunnel syndrome.  BP today 160/90 right arm sitting position he states he is taking his medicine daily previous BP was 120/78 a couple weeks ago   Review of Systems General and cardiovascular musculoskeletal review of systems otherwise negative    Objective:   Physical Exam  Well-developed well-nourished man no acute distress BP right arm sitting position 160/90  Left hand appears normal right hand was swollen but he had it in a dependent position all night      Assessment & Plan:  Hypertension question at goal plan BP check daily followup 2 weeks continue current medication  Carpal tunnel syndrome ,,,,,,,,, surgery the Thursday with Dr. Merlyn Norman plan,,,,,,,, okayed for surgery check pro time prior to surgery

## 2011-04-20 ENCOUNTER — Encounter (HOSPITAL_BASED_OUTPATIENT_CLINIC_OR_DEPARTMENT_OTHER): Payer: Medicare Other

## 2011-04-20 ENCOUNTER — Other Ambulatory Visit: Payer: Self-pay

## 2011-04-20 ENCOUNTER — Other Ambulatory Visit: Payer: Self-pay | Admitting: Orthopedic Surgery

## 2011-04-20 LAB — BASIC METABOLIC PANEL
BUN: 18 mg/dL (ref 6–23)
CO2: 24 mEq/L (ref 19–32)
Calcium: 10.4 mg/dL (ref 8.4–10.5)
Chloride: 103 mEq/L (ref 96–112)
Creatinine, Ser: 0.77 mg/dL (ref 0.50–1.35)
GFR calc Af Amer: >90 mL/min (ref >90)
GFR calc non Af Amer: 89 mL/min — ABNORMAL LOW (ref >90)
Glucose, Bld: 112 mg/dL — ABNORMAL HIGH (ref 70–99)
Potassium: 4 mEq/L (ref 3.5–5.1)
Sodium: 139 mEq/L (ref 135–145)

## 2011-04-20 LAB — PROTIME-INR
INR: 1.84 — ABNORMAL HIGH (ref 0.00–1.49)
Prothrombin Time: 21.6 seconds — ABNORMAL HIGH (ref 11.6–15.2)

## 2011-04-20 LAB — URIC ACID: Uric Acid, Serum: 5.5 mg/dL (ref 4.0–7.8)

## 2011-04-21 ENCOUNTER — Encounter (HOSPITAL_BASED_OUTPATIENT_CLINIC_OR_DEPARTMENT_OTHER): Payer: Self-pay | Admitting: Orthopedic Surgery

## 2011-04-21 ENCOUNTER — Encounter (HOSPITAL_BASED_OUTPATIENT_CLINIC_OR_DEPARTMENT_OTHER)
Admission: RE | Disposition: A | Discharge status: Home Health | Payer: Self-pay | Source: Ambulatory Visit | Attending: Orthopedic Surgery

## 2011-04-21 ENCOUNTER — Ambulatory Visit (HOSPITAL_BASED_OUTPATIENT_CLINIC_OR_DEPARTMENT_OTHER): Payer: Medicare Other | Admitting: Anesthesiology

## 2011-04-21 ENCOUNTER — Encounter (HOSPITAL_BASED_OUTPATIENT_CLINIC_OR_DEPARTMENT_OTHER): Payer: Self-pay | Admitting: *Deleted

## 2011-04-21 ENCOUNTER — Encounter (HOSPITAL_BASED_OUTPATIENT_CLINIC_OR_DEPARTMENT_OTHER): Payer: Self-pay | Admitting: Anesthesiology

## 2011-04-21 ENCOUNTER — Ambulatory Visit (HOSPITAL_BASED_OUTPATIENT_CLINIC_OR_DEPARTMENT_OTHER)
Admission: RE | Admit: 2011-04-21 | Discharge: 2011-04-21 | Disposition: A | Discharge status: Home Health | Payer: Medicare Other | Source: Ambulatory Visit | Attending: Orthopedic Surgery | Admitting: Orthopedic Surgery

## 2011-04-21 DIAGNOSIS — I2510 Atherosclerotic heart disease of native coronary artery without angina pectoris: Secondary | ICD-10-CM | POA: Insufficient documentation

## 2011-04-21 DIAGNOSIS — G5600 Carpal tunnel syndrome, unspecified upper limb: Secondary | ICD-10-CM | POA: Insufficient documentation

## 2011-04-21 DIAGNOSIS — I10 Essential (primary) hypertension: Secondary | ICD-10-CM | POA: Insufficient documentation

## 2011-04-21 DIAGNOSIS — Z01810 Encounter for preprocedural cardiovascular examination: Secondary | ICD-10-CM | POA: Insufficient documentation

## 2011-04-21 DIAGNOSIS — L259 Unspecified contact dermatitis, unspecified cause: Secondary | ICD-10-CM | POA: Insufficient documentation

## 2011-04-21 DIAGNOSIS — R609 Edema, unspecified: Secondary | ICD-10-CM | POA: Insufficient documentation

## 2011-04-21 DIAGNOSIS — E785 Hyperlipidemia, unspecified: Secondary | ICD-10-CM | POA: Insufficient documentation

## 2011-04-21 DIAGNOSIS — K219 Gastro-esophageal reflux disease without esophagitis: Secondary | ICD-10-CM | POA: Insufficient documentation

## 2011-04-21 DIAGNOSIS — N400 Enlarged prostate without lower urinary tract symptoms: Secondary | ICD-10-CM | POA: Insufficient documentation

## 2011-04-21 HISTORY — PX: CARPAL TUNNEL RELEASE: SHX101

## 2011-04-21 LAB — PROTIME-INR
INR: 1.46 (ref 0.00–1.49)
Prothrombin Time: 18 seconds — ABNORMAL HIGH (ref 11.6–15.2)

## 2011-04-21 SURGERY — CARPAL TUNNEL RELEASE
Anesthesia: Choice | Laterality: Left | Wound class: Clean

## 2011-04-21 MED ORDER — 0.9 % SODIUM CHLORIDE (POUR BTL) OPTIME
TOPICAL | Status: DC | PRN
  Administered 2011-04-21: 50 mL

## 2011-04-21 MED ORDER — DEXAMETHASONE SODIUM PHOSPHATE 4 MG/ML IJ SOLN
INTRAMUSCULAR | Status: DC | PRN
  Administered 2011-04-21: 4 mg via INTRAVENOUS

## 2011-04-21 MED ORDER — FENTANYL CITRATE 0.05 MG/ML IJ SOLN: 50.0000 ug | INTRAMUSCULAR | Status: DC | PRN

## 2011-04-21 MED ORDER — BUPIVACAINE HCL (PF) 0.25 % IJ SOLN
INTRAMUSCULAR | Status: DC | PRN
  Administered 2011-04-21: 10 mL

## 2011-04-21 MED ORDER — CEFAZOLIN SODIUM 1-5 GM-% IV SOLN
INTRAVENOUS | Status: DC | PRN
  Administered 2011-04-21: 2 g via INTRAVENOUS

## 2011-04-21 MED ORDER — MIDAZOLAM HCL 2 MG/2ML IJ SOLN: 0.5000 mg | INTRAMUSCULAR | Status: DC | PRN

## 2011-04-21 MED ORDER — ONDANSETRON HCL 4 MG/2ML IJ SOLN
INTRAMUSCULAR | Status: DC | PRN
  Administered 2011-04-21: 4 mg via INTRAVENOUS

## 2011-04-21 MED ORDER — FENTANYL CITRATE 0.05 MG/ML IJ SOLN
INTRAMUSCULAR | Status: DC | PRN
  Administered 2011-04-21 (×2): 50 ug via INTRAVENOUS

## 2011-04-21 MED ORDER — LACTATED RINGERS IV SOLN
Freq: Once | INTRAVENOUS | Status: AC
  Administered 2011-04-21: 08:00:00 via INTRAVENOUS

## 2011-04-21 MED ORDER — OXYCODONE-ACETAMINOPHEN 5-325 MG PO TABS: ORAL_TABLET | ORAL | Status: AC

## 2011-04-21 MED ORDER — PROPOFOL 10 MG/ML IV EMUL
INTRAVENOUS | Status: DC | PRN
  Administered 2011-04-21: 100 ug/kg/min via INTRAVENOUS

## 2011-04-21 MED ORDER — LIDOCAINE HCL (CARDIAC) 20 MG/ML IV SOLN
INTRAVENOUS | Status: DC | PRN
  Administered 2011-04-21: 2 mg via INTRAVENOUS

## 2011-04-21 SURGICAL SUPPLY — 35 items
BANDAGE ELASTIC 3 VELCRO ST LF (GAUZE/BANDAGES/DRESSINGS) ×2 IMPLANT
BANDAGE GAUZE ELAST BULKY 4 IN (GAUZE/BANDAGES/DRESSINGS) ×2 IMPLANT
BLADE MINI RND TIP GREEN BEAV (BLADE) IMPLANT
BLADE SURG 15 STRL LF DISP TIS (BLADE) ×2 IMPLANT
BLADE SURG 15 STRL SS (BLADE) ×2
BNDG ESMARK 4X9 LF (GAUZE/BANDAGES/DRESSINGS) IMPLANT
CHLORAPREP W/TINT 26ML (MISCELLANEOUS) ×2 IMPLANT
CLOTH BEACON ORANGE TIMEOUT ST (SAFETY) ×2 IMPLANT
CORDS BIPOLAR (ELECTRODE) ×2 IMPLANT
COVER MAYO STAND STRL (DRAPES) ×2 IMPLANT
COVER TABLE BACK 60X90 (DRAPES) ×2 IMPLANT
CUFF TOURNIQUET SINGLE 18IN (TOURNIQUET CUFF) ×2 IMPLANT
DRAPE EXTREMITY T 121X128X90 (DRAPE) ×2 IMPLANT
DRAPE SURG 17X23 STRL (DRAPES) ×2 IMPLANT
DRSG PAD ABDOMINAL 8X10 ST (GAUZE/BANDAGES/DRESSINGS) ×2 IMPLANT
GAUZE XEROFORM 1X8 LF (GAUZE/BANDAGES/DRESSINGS) ×2 IMPLANT
GLOVE BIO SURGEON STRL SZ7.5 (GLOVE) ×2 IMPLANT
GLOVE SKINSENSE NS SZ7.0 (GLOVE) ×1
GLOVE SKINSENSE STRL SZ7.0 (GLOVE) ×1 IMPLANT
GOWN PREVENTION PLUS XLARGE (GOWN DISPOSABLE) ×2 IMPLANT
GOWN STRL REIN XL XLG (GOWN DISPOSABLE) ×2 IMPLANT
NEEDLE HYPO 25X1 1.5 SAFETY (NEEDLE) ×4 IMPLANT
NS IRRIG 1000ML POUR BTL (IV SOLUTION) ×2 IMPLANT
PACK BASIN DAY SURGERY FS (CUSTOM PROCEDURE TRAY) ×2 IMPLANT
PADDING CAST ABS 4INX4YD NS (CAST SUPPLIES) ×1
PADDING CAST ABS COTTON 4X4 ST (CAST SUPPLIES) ×1 IMPLANT
SHEET MEDIUM DRAPE 40X70 STRL (DRAPES) IMPLANT
SPONGE GAUZE 4X4 12PLY (GAUZE/BANDAGES/DRESSINGS) ×2 IMPLANT
STOCKINETTE 4X48 STRL (DRAPES) ×2 IMPLANT
SUT ETHILON 4 0 PS 2 18 (SUTURE) ×2 IMPLANT
SYR BULB 3OZ (MISCELLANEOUS) ×2 IMPLANT
SYR CONTROL 10ML LL (SYRINGE) ×4 IMPLANT
TOWEL OR 17X24 6PK STRL BLUE (TOWEL DISPOSABLE) ×4 IMPLANT
UNDERPAD 30X30 INCONTINENT (UNDERPADS AND DIAPERS) ×2 IMPLANT
WATER STERILE IRR 1000ML POUR (IV SOLUTION) IMPLANT

## 2011-04-21 NOTE — Anesthesia Preprocedure Evaluation (Addendum)
Anesthesia Evaluation  Patient identified by MRN, date of birth, ID band Patient awake    Reviewed: Allergy & Precautions, H&P , NPO status , Patient's Chart, lab work & pertinent test results, reviewed documented beta blocker date and time   Airway Mallampati: II TM Distance: >3 FB Neck ROM: full    Dental   Pulmonary neg pulmonary ROS,          Cardiovascular hypertension, On Medications and On Home Beta Blockers + CAD     Neuro/Psych  Neuromuscular disease negative psych ROS   GI/Hepatic Neg liver ROS, GERD-  Medicated and Controlled,  Endo/Other  negative endocrine ROS  Renal/GU negative Renal ROS  negative genitourinary   Musculoskeletal   Abdominal   Peds  Hematology negative hematology ROS (+)   Anesthesia Other Findings See surgeon's H&P   Reproductive/Obstetrics negative OB ROS                           Anesthesia Physical Anesthesia Plan  ASA: III  Anesthesia Plan: MAC and Bier Block   Post-op Pain Management:    Induction: Intravenous  Airway Management Planned: Simple Face Mask  Additional Equipment:   Intra-op Plan:   Post-operative Plan:   Informed Consent: I have reviewed the patients History and Physical, chart, labs and discussed the procedure including the risks, benefits and alternatives for the proposed anesthesia with the patient or authorized representative who has indicated his/her understanding and acceptance.   Dental Advisory Given  Plan Discussed with: CRNA and Surgeon  Anesthesia Plan Comments: (Pt has H/O DVT R leg on Coumadin)      Anesthesia Quick Evaluation

## 2011-04-21 NOTE — Anesthesia Postprocedure Evaluation (Signed)
Anesthesia Post Note  Patient: William Norman  Procedure(s) Performed: Procedure(s) (LRB): CARPAL TUNNEL RELEASE (Left)  Anesthesia type: general  Patient location: PACU  Post pain: Pain level controlled  Post assessment: Patient's Cardiovascular Status Stable  Last Vitals:  Filed Vitals:   04/21/11 1115  BP: 176/99  Pulse: 57  Temp: 36.9 C  Resp: 20    Post vital signs: Reviewed and stable  Level of consciousness: sedated  Complications: No apparent anesthesia complications

## 2011-04-21 NOTE — Discharge Instructions (Addendum)
[facility] Center Instructions Hand Surgery  Wound Care: Keep your hand elevated above the level of your heart.  Do not allow it to dangle  by your side.  Keep the dressing dry and do not remove it unless your doctor advises you to do so.  He will usually change it at the time of your post-op visit.  Moving your fingers is advised to stimulate circulation but will depend on the site of your surgery.  If you have a splint applied, your doctor will advise you regarding movement.  Activity: Do not drive or operate machinery today.  Rest today and then you may return to your normal activity and work as indicated by your physician.  Diet:  Drink liquids today or eat a light diet.  You may resume a regular diet tomorrow.    General expectations: Pain for two to three days. Fingers may become slightly swollen.  Call your doctor if any of the following occur: Severe pain not relieved by pain medication. Elevated temperature. Dressing soaked with blood. Inability to move fingers. White or bluish color to fingers.[facility]  [address] [place], [place] [address] [phone]   Post Anesthesia Home Care Instructions  Activity: Get plenty of rest for the remainder of the day. A responsible adult should stay with you for 24 hours following the procedure.  For the next 24 hours, DO NOT: -Drive a car -Operate machinery -Drink alcoholic beverages -Take any medication unless instructed by your physician -Make any legal decisions or sign important papers.  Meals: Start with liquid foods such as gelatin or soup. Progress to regular foods as tolerated. Avoid greasy, spicy, heavy foods. If nausea and/or vomiting occur, drink only clear liquids until the nausea and/or vomiting subsides. Call your physician if vomiting continues.  Special Instructions/Symptoms: Your throat may feel dry or sore from the anesthesia or the breathing tube placed in your throat during surgery. If  this causes discomfort, gargle with warm salt water. The discomfort should disappear within 24 hours.   

## 2011-04-21 NOTE — Anesthesia Procedure Notes (Addendum)
Performed by: Meyer Russel   Procedure Name: MAC Date/Time: 04/21/2011 9:06 AM Performed by: Meyer Russel Pre-anesthesia Checklist: Patient identified, Timeout performed, Emergency Drugs available, Suction available and Patient being monitored Patient Re-evaluated:Patient Re-evaluated prior to inductionOxygen Delivery Method: Simple face mask Preoxygenation: Pre-oxygenation with 100% oxygen

## 2011-04-21 NOTE — Op Note (Signed)
Dictation [id number]

## 2011-04-21 NOTE — Brief Op Note (Signed)
04/21/2011  10:04 AM  PATIENT:  Assunta Curtis  71 y.o. male  PRE-OPERATIVE DIAGNOSIS:  Left CTS  POST-OPERATIVE DIAGNOSIS:  Left Carpal Tunnel Syndrone  PROCEDURE:  Procedure(s) (LRB): CARPAL TUNNEL RELEASE (Left)  SURGEON:  Surgeon(s) and Role:    * Tami Ribas, MD - Primary  PHYSICIAN ASSISTANT:   ASSISTANTS: none   ANESTHESIA:   bier block with local  EBL:  Total I/O In: 600 [I.V.:600] Out: -   BLOOD ADMINISTERED:none  DRAINS: none   LOCAL MEDICATIONS USED:  MARCAINE     SPECIMEN:  No Specimen  DISPOSITION OF SPECIMEN:  N/A  COUNTS:  YES  TOURNIQUET:   Total Tourniquet Time Documented: Forearm (Left) - 49 minutes  DICTATION: .Other Dictation: Dictation Number [id number]  PLAN OF CARE: Discharge to home after PACU  PATIENT DISPOSITION:  PACU - hemodynamically stable.

## 2011-04-21 NOTE — Transfer of Care (Signed)
Immediate Anesthesia Transfer of Care Note  Patient: William Norman  Procedure(s) Performed: Procedure(s) (LRB): CARPAL TUNNEL RELEASE (Left)  Patient Location: PACU  Anesthesia Type: Bier block  Level of Consciousness: awake, alert  and oriented  Airway & Oxygen Therapy: Patient Spontanous Breathing and Patient connected to face mask oxygen  Post-op Assessment: Report given to PACU RN, Post -op Vital signs reviewed and stable and Patient moving all extremities  Post vital signs: Reviewed and stable  Complications: No apparent anesthesia complications

## 2011-04-21 NOTE — H&P (Signed)
William Norman is an 71 y.o. male.   Chief Complaint: carpal tunnel syndrome HPI: 71 yo rhd male with pain and numbness in hands for years with diagnosis of carpal tunnel syndrome.  Worsening of symptoms 3 months ago.  Nocturnal symptoms.  Positive nerve conduction testing.  Past Medical History  Diagnosis Date  . CAD (coronary artery disease)   . Hyperlipidemia   . Hypertension   . BPH (benign prostatic hypertrophy)   . GERD (gastroesophageal reflux disease)   . Eczema   . Peripheral edema     Past Surgical History  Procedure Date  . Precutaneous transluminal coronary angioplasty   . Tonsillectomy and adenoidectomy   . Right hernia   . Dvt right leg   . Colon surgery 2010 and 2011 colostomy reversal    Family History  Problem Relation Age of Onset  . Heart disease Father    Social History:  reports that he quit smoking about 13 years ago. He does not have any smokeless tobacco history on file. He reports that he drinks about .6 ounces of alcohol per week. He reports that he does not use illicit drugs.  Allergies: Not on File  Medications Prior to Admission  Medication Dose Route Frequency Provider Last Rate Last Dose  . fentaNYL (SUBLIMAZE) injection 50-100 mcg  50-100 mcg Intravenous PRN Constance Goltz, MD      . lactated ringers infusion   Intravenous Once Aubery Lapping, MD 20 mL/hr at 04/21/11 0818    . midazolam (VERSED) injection 0.5-2 mg  0.5-2 mg Intravenous PRN Constance Goltz, MD       Medications Prior to Admission  Medication Sig Dispense Refill  . atenolol (TENORMIN) 100 MG tablet Take 1 tablet (100 mg total) by mouth daily.  100 tablet  3  . atorvastatin (LIPITOR) 20 MG tablet Take 1 tablet (20 mg total) by mouth daily.  100 tablet  3  . co-enzyme Q-10 30 MG capsule Take 30 mg by mouth 3 (three) times daily.      Marland Kitchen esomeprazole (NEXIUM) 40 MG capsule Take 1 capsule (40 mg total) by mouth daily before breakfast.  90 capsule  1  .  lisinopril (PRINIVIL,ZESTRIL) 40 MG tablet Take 1 tablet (40 mg total) by mouth daily.  100 tablet  3  . oxymetazoline (AFRIN) 0.05 % nasal spray Place 2 sprays into the nose 2 (two) times daily.  30 mL  5  . traMADol (ULTRAM) 50 MG tablet Take 50 mg by mouth every 6 (six) hours as needed.      . hydrochlorothiazide (HYDRODIURIL) 25 MG tablet Take 1 tablet (25 mg total) by mouth daily.  90 tablet  3  . niacin 250 MG tablet Take 250 mg by mouth daily with breakfast.        . nitroGLYCERIN (NITROSTAT) 0.4 MG SL tablet Place 1 tablet (0.4 mg total) under the tongue every 5 (five) minutes as needed.  25 tablet  1  . sildenafil (VIAGRA) 100 MG tablet Take 1 tablet (100 mg total) by mouth daily as needed.  6 tablet  5  . warfarin (COUMADIN) 5 MG tablet Take 1 tablet (5 mg total) by mouth daily.  90 tablet  3    Results for orders placed during the hospital encounter of 04/21/11 (from the past 48 hour(s))  BASIC METABOLIC PANEL     Status: Abnormal   Collection Time   04/20/11  4:00 PM      Component Value Range  Comment   Sodium 139  135 - 145 (mEq/L)    Potassium 4.0  3.5 - 5.1 (mEq/L)    Chloride 103  96 - 112 (mEq/L)    CO2 24  19 - 32 (mEq/L)    Glucose, Bld 112 (*) 70 - 99 (mg/dL)    BUN 18  6 - 23 (mg/dL)    Creatinine, Ser 4.09  0.50 - 1.35 (mg/dL)    Calcium 81.1  8.4 - 10.5 (mg/dL)    GFR calc non Af Amer 89 (*) >90 (mL/min)    GFR calc Af Amer >90  >90 (mL/min)   PROTIME-INR     Status: Abnormal   Collection Time   04/20/11  4:00 PM      Component Value Range Comment   Prothrombin Time 21.6 (*) 11.6 - 15.2 (seconds)    INR 1.84 (*) 0.00 - 1.49      No results found.   A comprehensive review of systems was negative except for: Eyes: positive for contacts/glasses Hematologic/lymphatic: positive for bleeding  Blood pressure 129/76, pulse 55, temperature 98.2 F (36.8 C), temperature source Oral, resp. rate 18, height 5\' 11"  (1.803 m), weight 100.699 kg (222 lb), SpO2  95.00%.  General appearance: alert, cooperative and appears stated age Head: Normocephalic, without obvious abnormality, atraumatic Neck: supple, symmetrical, trachea midline Resp: clear to auscultation bilaterally Cardio: regular rate and rhythm GI: soft, non-tender; bowel sounds normal; no masses,  no organomegaly Extremities: light touch sensation and capillary refill intact all digits.  positive phalens and durkins test on left. Pulses: 2+ and symmetric Skin: Skin color, texture, turgor normal. No rashes or lesions Neurologic: Grossly normal Incision/Wound: na  Assessment/Plan Bilateral carpal tunnel syndrome.  Discussed non operative and operative treatment options.  Patient elected surgical release of left carpal tunnel.  Risks, benefits, and alternatives of surgery were discussed and the patient agrees with the plan of care.   William Norman William 04/21/2011, 8:42 AM

## 2011-04-22 ENCOUNTER — Encounter (HOSPITAL_BASED_OUTPATIENT_CLINIC_OR_DEPARTMENT_OTHER): Payer: Self-pay | Admitting: Orthopedic Surgery

## 2011-04-22 NOTE — Op Note (Signed)
William Norman, William Norman             ACCOUNT NO.:  [id number]  MEDICAL RECORD NO.:  [id number]  LOCATION:                                 FACILITY:  PHYSICIAN:  Betha Loa, MD             DATE OF BIRTH:  DATE OF PROCEDURE:  04/21/2011 DATE OF DISCHARGE:                              OPERATIVE REPORT   PREOPERATIVE DIAGNOSIS:  Left carpal tunnel syndrome.  POSTOPERATIVE DIAGNOSIS:  Left carpal tunnel syndrome.  PROCEDURE:  Left carpal tunnel release.  SURGEON:  Betha Loa, M.D.  ASSISTANTS:  None.  ANESTHESIA:  Bier block.  IV FLUIDS:  Per anesthesia flow sheet.  ESTIMATED BLOOD LOSS:  Minimal.  COMPLICATIONS:  None.  SPECIMENS:  None.  TOURNIQUET TIME:  49 minutes.  DISPOSITION:  Stable to PACU.  INDICATIONS:  William Norman is a 71 year old right-hand dominant male who has had many years of carpal tunnel syndrome on bilateral hands.  It has been bothering him more over the past few months.  He presented to me in the office.  He notes nocturnal symptoms.  He had positive Phalen's and Durkin's at median nerve and at the carpal tunnel on the left side. Nerve conduction studies confirmed carpal tunnel syndrome bilaterally. He wished to have a release of transverse carpal ligament after discussion of nonoperative and operative treatment measures.  Risks, benefits, alternatives of surgery were discussed including risk of blood loss; infection; damage to nerves, vessels, tendons, ligaments, bone; failure of surgery; need for additional surgery; complications with wound healing, continued pain, continued carpal tunnel syndrome.  He voiced understanding of these risks and elected proceed.  OPERATIVE COURSE:  After being identified preoperatively by myself, the patient and I agreed upon procedure and site of the procedure.  Surgical site was marked.  The risks, benefits, alternatives of the surgery were reviewed and he wished to proceed.  Surgical had been consent.  He  was given 1 g of IV Ancef as preoperative antibiotic prophylaxis.  He was transferred to the operating room and placed on the operating table in supine position with left upper extremity on arm board.  A Bier block was performed using a left forearm tourniquet.  The left upper extremity was prepped and draped in normal sterile orthopedic fashion. A surgical pause was performed between surgeons, anesthesia, operating staff, and all were in agreement with the patient, procedure and site of the procedure.  Incision was made over the transverse carpal ligament, carried into subcutaneous tissues by spreading technique.  The ligament was identified and was incised sharply.  It was incised distally first. Care was taken to ensure complete release of transverse carpal ligament in distal direction.  We then incised it proximally.  The distal aspect of the volar antebrachial fascia was split using the scissors.  The finger was placed into the wound.  There was good decompression distally.  At the proximal aspect,  there was some tight bands of tissue.  Attempts to reach these from within the incision were unsuccessful.  The incision was extended proximally to allow direct visualization to prevent any injury to the nerve.  The tight bands of tissue were released.  It  was confirmed that there was complete decompression of the median nerve through the carpal tunnel.  The nerve was hyperemic and flattened.  The motor branch of the nerve was identified and was intact.  The wound was copiously irrigated with sterile saline by bulb syringe.  The skin was closed with 4-0 nylon in a horizontal mattress fashion.  The wound had been injected with 10 mL of 0.25% plain Marcaine at the start of the case.  The wound was then dressed with sterile Xeroform, 4x4s, Ace, and ABD, wrapped with Kerlix and Ace bandage.  The tourniquet was deflated at 49 minutes.  The fingertips were pink with brisk capillary refill after  deflation of tourniquet.  Operative drapes were broken down and the patient was awoken from anesthesia safely.  He was transferred back to stretcher and taken to PACU in stable condition.  I will see him back in the office in 1 week for postoperative followup.  I will give him Percocet 5/325, 1-2 p.o. q.6 hours p.r.n. pain, dispensed #40.     Betha Loa, MD     KK/MEDQ  D:  04/21/2011  T:  04/21/2011  Job:  [id number]

## 2011-04-28 NOTE — Progress Notes (Signed)
Quick Note:  Left a message for pt to return call. ______ 

## 2011-05-05 ENCOUNTER — Other Ambulatory Visit: Payer: Self-pay | Admitting: Orthopedic Surgery

## 2011-05-19 ENCOUNTER — Encounter (HOSPITAL_BASED_OUTPATIENT_CLINIC_OR_DEPARTMENT_OTHER): Payer: Self-pay | Admitting: *Deleted

## 2011-05-19 NOTE — Progress Notes (Signed)
To do inr-istat dos

## 2011-05-23 ENCOUNTER — Encounter (HOSPITAL_BASED_OUTPATIENT_CLINIC_OR_DEPARTMENT_OTHER)
Admission: RE | Disposition: A | Discharge status: Home / Self Care | Payer: Self-pay | Source: Ambulatory Visit | Attending: Orthopedic Surgery

## 2011-05-23 ENCOUNTER — Ambulatory Visit (HOSPITAL_BASED_OUTPATIENT_CLINIC_OR_DEPARTMENT_OTHER)
Admission: RE | Admit: 2011-05-23 | Discharge: 2011-05-23 | Disposition: A | Discharge status: Home / Self Care | Payer: Medicare Other | Source: Ambulatory Visit | Attending: Orthopedic Surgery | Admitting: Orthopedic Surgery

## 2011-05-23 ENCOUNTER — Ambulatory Visit (HOSPITAL_BASED_OUTPATIENT_CLINIC_OR_DEPARTMENT_OTHER): Payer: Medicare Other | Admitting: Certified Registered Nurse Anesthetist

## 2011-05-23 ENCOUNTER — Encounter (HOSPITAL_BASED_OUTPATIENT_CLINIC_OR_DEPARTMENT_OTHER): Payer: Self-pay

## 2011-05-23 ENCOUNTER — Encounter (HOSPITAL_BASED_OUTPATIENT_CLINIC_OR_DEPARTMENT_OTHER): Payer: Self-pay | Admitting: Certified Registered Nurse Anesthetist

## 2011-05-23 DIAGNOSIS — I10 Essential (primary) hypertension: Secondary | ICD-10-CM | POA: Insufficient documentation

## 2011-05-23 DIAGNOSIS — K219 Gastro-esophageal reflux disease without esophagitis: Secondary | ICD-10-CM | POA: Insufficient documentation

## 2011-05-23 DIAGNOSIS — G5600 Carpal tunnel syndrome, unspecified upper limb: Secondary | ICD-10-CM | POA: Insufficient documentation

## 2011-05-23 DIAGNOSIS — I2510 Atherosclerotic heart disease of native coronary artery without angina pectoris: Secondary | ICD-10-CM | POA: Insufficient documentation

## 2011-05-23 HISTORY — DX: Acute embolism and thrombosis of unspecified deep veins of unspecified lower extremity: I82.409

## 2011-05-23 HISTORY — PX: CARPAL TUNNEL RELEASE: SHX101

## 2011-05-23 LAB — APTT: aPTT: 34 seconds (ref 24–37)

## 2011-05-23 LAB — POCT I-STAT, CHEM 8
BUN: 29 mg/dL — ABNORMAL HIGH (ref 6–23)
Calcium, Ion: 1.29 mmol/L (ref 1.12–1.32)
Chloride: 108 mEq/L (ref 96–112)
Creatinine, Ser: 1.1 mg/dL (ref 0.50–1.35)
Glucose, Bld: 100 mg/dL — ABNORMAL HIGH (ref 70–99)
HCT: 41 % (ref 39.0–52.0)
Hemoglobin: 13.9 g/dL (ref 13.0–17.0)
Potassium: 3.7 mEq/L (ref 3.5–5.1)
Sodium: 140 mEq/L (ref 135–145)
TCO2: 24 mmol/L (ref 0–100)

## 2011-05-23 LAB — PROTIME-INR
INR: 0.98 (ref 0.00–1.49)
Prothrombin Time: 13.2 seconds (ref 11.6–15.2)

## 2011-05-23 SURGERY — CARPAL TUNNEL RELEASE
Anesthesia: General | Site: Hand | Laterality: Right | Wound class: Clean

## 2011-05-23 MED ORDER — FENTANYL CITRATE 0.05 MG/ML IJ SOLN
INTRAMUSCULAR | Status: DC | PRN
  Administered 2011-05-23: 25 ug via INTRAVENOUS
  Administered 2011-05-23 (×2): 50 ug via INTRAVENOUS

## 2011-05-23 MED ORDER — OXYCODONE-ACETAMINOPHEN 5-325 MG PO TABS: ORAL_TABLET | ORAL | Status: AC

## 2011-05-23 MED ORDER — 0.9 % SODIUM CHLORIDE (POUR BTL) OPTIME
TOPICAL | Status: DC | PRN
  Administered 2011-05-23: 1000 mL

## 2011-05-23 MED ORDER — CHLORHEXIDINE GLUCONATE 4 % EX LIQD: 60.0000 mL | Freq: Once | CUTANEOUS | Status: DC

## 2011-05-23 MED ORDER — LIDOCAINE HCL (CARDIAC) 20 MG/ML IV SOLN
INTRAVENOUS | Status: DC | PRN
  Administered 2011-05-23: 60 mg via INTRAVENOUS

## 2011-05-23 MED ORDER — PROPOFOL 10 MG/ML IV EMUL
INTRAVENOUS | Status: DC | PRN
  Administered 2011-05-23: 180 mg via INTRAVENOUS

## 2011-05-23 MED ORDER — LACTATED RINGERS IV SOLN
INTRAVENOUS | Status: DC
  Administered 2011-05-23 (×2): via INTRAVENOUS

## 2011-05-23 MED ORDER — BUPIVACAINE HCL (PF) 0.25 % IJ SOLN
INTRAMUSCULAR | Status: DC | PRN
  Administered 2011-05-23: 8 mL

## 2011-05-23 MED ORDER — CEFAZOLIN SODIUM 1-5 GM-% IV SOLN
1.0000 g | INTRAVENOUS | Status: AC
Start: 2011-05-23 — End: 2011-05-23
  Administered 2011-05-23: 2 g via INTRAVENOUS

## 2011-05-23 MED ORDER — DEXAMETHASONE SODIUM PHOSPHATE 10 MG/ML IJ SOLN
INTRAMUSCULAR | Status: DC | PRN
  Administered 2011-05-23: 5 mg via INTRAVENOUS

## 2011-05-23 MED ORDER — ONDANSETRON HCL 4 MG/2ML IJ SOLN
INTRAMUSCULAR | Status: DC | PRN
  Administered 2011-05-23: 4 mg via INTRAVENOUS

## 2011-05-23 SURGICAL SUPPLY — 33 items
BANDAGE ELASTIC 3 VELCRO ST LF (GAUZE/BANDAGES/DRESSINGS) ×2 IMPLANT
BANDAGE GAUZE ELAST BULKY 4 IN (GAUZE/BANDAGES/DRESSINGS) ×2 IMPLANT
BLADE MINI RND TIP GREEN BEAV (BLADE) IMPLANT
BLADE SURG 15 STRL LF DISP TIS (BLADE) ×2 IMPLANT
BLADE SURG 15 STRL SS (BLADE) ×2
BNDG ESMARK 4X9 LF (GAUZE/BANDAGES/DRESSINGS) ×2 IMPLANT
CHLORAPREP W/TINT 26ML (MISCELLANEOUS) ×2 IMPLANT
CLOTH BEACON ORANGE TIMEOUT ST (SAFETY) ×2 IMPLANT
CORDS BIPOLAR (ELECTRODE) ×2 IMPLANT
COVER MAYO STAND STRL (DRAPES) ×2 IMPLANT
COVER TABLE BACK 60X90 (DRAPES) ×2 IMPLANT
CUFF TOURNIQUET SINGLE 18IN (TOURNIQUET CUFF) ×2 IMPLANT
DRAPE EXTREMITY T 121X128X90 (DRAPE) ×2 IMPLANT
DRAPE SURG 17X23 STRL (DRAPES) ×2 IMPLANT
DRSG PAD ABDOMINAL 8X10 ST (GAUZE/BANDAGES/DRESSINGS) ×2 IMPLANT
GAUZE XEROFORM 1X8 LF (GAUZE/BANDAGES/DRESSINGS) ×2 IMPLANT
GLOVE BIO SURGEON STRL SZ 6.5 (GLOVE) ×2 IMPLANT
GLOVE BIO SURGEON STRL SZ7.5 (GLOVE) ×2 IMPLANT
GOWN PREVENTION PLUS XLARGE (GOWN DISPOSABLE) ×2 IMPLANT
GOWN STRL REIN XL XLG (GOWN DISPOSABLE) ×2 IMPLANT
NEEDLE HYPO 25X1 1.5 SAFETY (NEEDLE) ×2 IMPLANT
NS IRRIG 1000ML POUR BTL (IV SOLUTION) ×2 IMPLANT
PACK BASIN DAY SURGERY FS (CUSTOM PROCEDURE TRAY) ×2 IMPLANT
PADDING CAST ABS 4INX4YD NS (CAST SUPPLIES) ×1
PADDING CAST ABS COTTON 4X4 ST (CAST SUPPLIES) ×1 IMPLANT
SPONGE GAUZE 4X4 12PLY (GAUZE/BANDAGES/DRESSINGS) ×2 IMPLANT
STOCKINETTE 4X48 STRL (DRAPES) ×2 IMPLANT
SUT ETHILON 4 0 PS 2 18 (SUTURE) ×2 IMPLANT
SYR BULB 3OZ (MISCELLANEOUS) ×2 IMPLANT
SYR CONTROL 10ML LL (SYRINGE) ×2 IMPLANT
TOWEL OR 17X24 6PK STRL BLUE (TOWEL DISPOSABLE) ×4 IMPLANT
UNDERPAD 30X30 INCONTINENT (UNDERPADS AND DIAPERS) ×2 IMPLANT
WATER STERILE IRR 1000ML POUR (IV SOLUTION) ×2 IMPLANT

## 2011-05-23 NOTE — Op Note (Signed)
[name] NO.:  [id number]  MEDICAL RECORD NO.:  [id number]  LOCATION:                                 FACILITY:  PHYSICIAN:  Betha Loa, MD             DATE OF BIRTH:  DATE OF PROCEDURE:  05/23/2011 DATE OF DISCHARGE:                              OPERATIVE REPORT   PREOPERATIVE DIAGNOSIS:  Right carpal tunnel syndrome.  POSTOP DIAGNOSIS:  Right carpal tunnel syndrome.  PROCEDURE:  Right carpal tunnel release.  SURGEON:  Betha Loa, MD  ASSISTANTS:  None.  ANESTHESIA:  General.  IV FLUIDS:  Per anesthesia flow sheet.  ESTIMATED BLOOD LOSS:  Minimal.  COMPLICATIONS:  None.  SPECIMENS:  None.  TOURNIQUET TIME:  22 minutes.  DISPOSITION:  Stable to PACU.  INDICATIONS:  William Norman is a 71 year old right-hand dominant male who has been seen in my office for bilateral carpal tunnel syndrome.  He has positive nerve conduction studies.  He has had a left carpal tunnel release and wishes to have the right carpal tunnel released as well. Risks, benefits, alternatives of surgery were discussed including the risk of blood loss, infection, damage to nerves, vessels, tendons, ligaments, bone, failure of  surgery, need for additional surgery, complications with wound healing, continued pain, continued carpal tunnel syndrome.  He voiced understanding of these risks and elected to proceed.  OPERATIVE COURSE:  After being identified preoperatively by myself, the Patient and I agreed upon procedure and site of procedure.  Surgical site was marked.  The risks, benefits, alternatives of surgery were reviewed and he wished to proceed.  Surgical consent had been signed.  He was given 1 g of IV Ancef as preoperative antibiotic prophylaxis.  He was transported to the operating room, placed on operating room table in supine position with the right upper extremity on arm board.  General anesthesia was induced by the anesthesiologist.  The right upper  extremity was prepped and draped in the normal sterile orthopedic fashion.  A surgical pause was performed between surgeons, anesthesia, and operating staff and all were in agreement with the patient, procedure, and site of procedure.  Tourniquet to proximal aspect of the extremity was inflated 250 mmHg after exsanguination of the limb with an Esmarch bandage. Incision was made in the palm over the transverse carpal ligament.  This was carried into subcutaneous tissues by spreading technique.  The transverse carpal ligament was identified and was incised sharply in distal direction.  Care was taken to ensure complete release of the transverse carpal ligament distally.  The ligament was then incised proximally.  The distal aspect of the volar antebrachial fascia was released as well.  A finger was placed in the wound, and there was adequate decompression of median nerve both proximally and distally. The nerve was inspected.  It was somewhat flattened.  The motor branch was identified and was intact.  The wound was copiously irrigated with sterile saline.  It was closed with 4-0 nylon in horizontal mattress fashion.  It was injected with 8 mL of 0.25% plain Marcaine to aid in postoperative analgesia.  The wound was then dressed with sterile Xeroform, 4x4s,  an ABD and wrapped with Kerlix and Ace bandage.  The tourniquet was deflated at 22 minutes.  The operative drapes were broken down.  Fingertips were pink with brisk capillary refill after deflation of tourniquet.  The patient was awoken from anesthesia safely.  He was transferred back to stretcher and taken to PACU in stable condition.  I will see him back in the office in 1 week for postoperative followup.  I will give him Percocet 5/325 one to two p.o. q.6 hours p.r.n. pain, dispensed #40.     Betha Loa, MD     KK/MEDQ  D:  05/23/2011  T:  05/23/2011  Job:  [id number]

## 2011-05-23 NOTE — Brief Op Note (Signed)
05/23/2011  10:27 AM  PATIENT:  Assunta Curtis  71 y.o. male  PRE-OPERATIVE DIAGNOSIS:  Right Carpal Tunnel Syndrome  POST-OPERATIVE DIAGNOSIS:  Right Carpal Tunnel Syndrome  PROCEDURE:  Procedure(s) (LRB): CARPAL TUNNEL RELEASE (Right)  SURGEON:  Surgeon(s) and Role:    * Tami Ribas, MD - Primary  PHYSICIAN ASSISTANT:   ASSISTANTS: none   ANESTHESIA:   general  EBL:  Total I/O In: 1000 [I.V.:1000] Out: -   BLOOD ADMINISTERED:none  DRAINS: none   LOCAL MEDICATIONS USED:  MARCAINE     SPECIMEN:  No Specimen  DISPOSITION OF SPECIMEN:  N/A  COUNTS:  YES  TOURNIQUET:   Total Tourniquet Time Documented: Upper Arm (Right) - 22 minutes  DICTATION: .Other Dictation: Dictation Number [id number]  PLAN OF CARE: Discharge to home after PACU  PATIENT DISPOSITION:  PACU - hemodynamically stable.

## 2011-05-23 NOTE — Anesthesia Procedure Notes (Signed)
Procedure Name: LMA Insertion Date/Time: 05/23/2011 9:50 AM Performed by: Tekisha Darcey D Pre-anesthesia Checklist: Patient identified, Emergency Drugs available, Suction available and Patient being monitored Patient Re-evaluated:Patient Re-evaluated prior to inductionOxygen Delivery Method: Circle System Utilized Preoxygenation: Pre-oxygenation with 100% oxygen Intubation Type: IV induction Ventilation: Mask ventilation without difficulty LMA: LMA inserted LMA Size: 5.0 Number of attempts: 1 Placement Confirmation: positive ETCO2 Tube secured with: Tape Dental Injury: Teeth and Oropharynx as per pre-operative assessment

## 2011-05-23 NOTE — Discharge Instructions (Addendum)
[facility] Instructions °Hand Surgery ° °Wound Care: °Keep your hand elevated above the level of your heart.  Do not allow it to dangle by your side.  Keep the dressing dry and do not remove it unless your doctor advises you to do so.  He will usually change it at the time of your post-op visit.  Moving your fingers is advised to stimulate circulation but will depend on the site of your surgery.  If you have a splint applied, your doctor will advise you regarding movement. ° °Activity: °Do not drive or operate machinery today.  Rest today and then you may return to your normal activity and work as indicated by your physician. ° °Diet:  °Drink liquids today or eat a light diet.  You may resume a regular diet tomorrow.   ° °General expectations: °Pain for two to three days. °Fingers may become slightly swollen. ° °Call your doctor if any of the following occur: °Severe pain not relieved by pain medication. °Elevated temperature. °Dressing soaked with blood. °Inability to move fingers. °White or bluish color to fingers. ° ° ° °Post Anesthesia Home Care Instructions ° °Activity: °Get plenty of rest for the remainder of the day. A responsible adult should stay with you for 24 hours following the procedure.  °For the next 24 hours, DO NOT: °-Drive a car °-Operate machinery °-Drink alcoholic beverages °-Take any medication unless instructed by your physician °-Make any legal decisions or sign important papers. ° °Meals: °Start with liquid foods such as gelatin or soup. Progress to regular foods as tolerated. Avoid greasy, spicy, heavy foods. If nausea and/or vomiting occur, drink only clear liquids until the nausea and/or vomiting subsides. Call your physician if vomiting continues. ° °Special Instructions/Symptoms: °Your throat may feel dry or sore from the anesthesia or the breathing tube placed in your throat during surgery. If this causes discomfort, gargle with warm salt water. The discomfort should disappear within  24 hours. ° ° °Call your surgeon if you experience:  ° °1.  Fever over 101.0. °2.  Inability to urinate. °3.  Nausea and/or vomiting. °4.  Extreme swelling or bruising at the surgical site. °5.  Continued bleeding from the incision. °6.  Increased pain, redness or drainage from the incision. °7.  Problems related to your pain medication. °

## 2011-05-23 NOTE — H&P (Signed)
R William Norman is an 71 y.o. male.   Chief Complaint: carpal tunnel syndrome HPI: 71 yo rhd male with bilateral carpal tunnel syndrome.  Has had left carpal tunnel release.  Would like right carpal tunnel release.  Positive nerve conduction studies.  Past Medical History  Diagnosis Date  . CAD (coronary artery disease)   . Hyperlipidemia   . Hypertension   . BPH (benign prostatic hypertrophy)   . GERD (gastroesophageal reflux disease)   . Eczema   . Peripheral edema   . DVT (deep venous thrombosis)     Past Surgical History  Procedure Date  . Precutaneous transluminal coronary angioplasty   . Tonsillectomy and adenoidectomy   . Right hernia   . Dvt right leg   . Colon surgery 2010 and 2011 colostomy reversal  . Carpal tunnel release 04/21/2011    Procedure: CARPAL TUNNEL RELEASE;  Surgeon: Tami Ribas, MD;  Location: [facility];  Service: Orthopedics;  Laterality: Left;    Family History  Problem Relation Age of Onset  . Heart disease Father    Social History:  reports that he quit smoking about 13 years ago. He does not have any smokeless tobacco history on file. He reports that he drinks about .6 ounces of alcohol per week. He reports that he does not use illicit drugs.  Allergies: No Known Allergies  Medications Prior to Admission  Medication Dose Route Frequency Provider Last Rate Last Dose  . ceFAZolin (ANCEF) IVPB 1 g/50 mL premix  1 g Intravenous 60 min Pre-Op Tami Ribas, MD      . chlorhexidine (HIBICLENS) 4 % liquid 4 application  60 mL Topical Once Tami Ribas, MD      . chlorhexidine (HIBICLENS) 4 % liquid 4 application  60 mL Topical Once Tami Ribas, MD      . lactated ringers infusion   Intravenous Continuous Hart Robinsons, MD 20 mL/hr at 05/23/11 0800     Medications Prior to Admission  Medication Sig Dispense Refill  . atenolol (TENORMIN) 100 MG tablet Take 1 tablet (100 mg total) by mouth daily.  100 tablet  3  .  atorvastatin (LIPITOR) 20 MG tablet Take 1 tablet (20 mg total) by mouth daily.  100 tablet  3  . co-enzyme Q-10 30 MG capsule Take 30 mg by mouth 3 (three) times daily.      Marland Kitchen esomeprazole (NEXIUM) 40 MG capsule Take 1 capsule (40 mg total) by mouth daily before breakfast.  90 capsule  1  . hydrochlorothiazide (HYDRODIURIL) 25 MG tablet Take 1 tablet (25 mg total) by mouth daily.  90 tablet  3  . lisinopril (PRINIVIL,ZESTRIL) 40 MG tablet Take 1 tablet (40 mg total) by mouth daily.  100 tablet  3  . niacin 250 MG tablet Take 250 mg by mouth daily with breakfast.        . oxymetazoline (AFRIN) 0.05 % nasal spray Place 2 sprays into the nose 2 (two) times daily.  30 mL  5  . sildenafil (VIAGRA) 100 MG tablet Take 1 tablet (100 mg total) by mouth daily as needed.  6 tablet  5  . warfarin (COUMADIN) 5 MG tablet Take 1 tablet (5 mg total) by mouth daily.  90 tablet  3  . nitroGLYCERIN (NITROSTAT) 0.4 MG SL tablet Place 1 tablet (0.4 mg total) under the tongue every 5 (five) minutes as needed.  25 tablet  1    Results for orders placed during  the hospital encounter of 05/23/11 (from the past 48 hour(s))  POCT I-STAT, CHEM 8     Status: Abnormal   Collection Time   05/23/11  8:05 AM      Component Value Range Comment   Sodium 140  135 - 145 (mEq/L)    Potassium 3.7  3.5 - 5.1 (mEq/L)    Chloride 108  96 - 112 (mEq/L)    BUN 29 (*) 6 - 23 (mg/dL)    Creatinine, Ser 4.09  0.50 - 1.35 (mg/dL)    Glucose, Bld 811 (*) 70 - 99 (mg/dL)    Calcium, Ion 9.14  1.12 - 1.32 (mmol/L)    TCO2 24  0 - 100 (mmol/L)    Hemoglobin 13.9  13.0 - 17.0 (g/dL)    HCT 78.2  95.6 - 21.3 (%)     No results found.   A comprehensive review of systems was negative except for: Eyes: positive for contacts/glasses Hematologic/lymphatic: positive for bleeding  Blood pressure 122/75, pulse 55, temperature 97.6 F (36.4 C), temperature source Oral, resp. rate 18, height 5\' 11"  (1.803 m), weight 102.059 kg (225 lb), SpO2  97.00%.  General appearance: alert, cooperative and appears stated age Head: Normocephalic, without obvious abnormality, atraumatic Neck: supple, symmetrical, trachea midline Resp: clear to auscultation bilaterally Cardio: regular rate and rhythm GI: soft, non-tender; bowel sounds normal; no masses,  no organomegaly Extremities: intact capillary refill all digits.  fingertips numb feeling.  +epl/fpl/io. Pulses: 2+ and symmetric Skin: Skin color, texture, turgor normal. No rashes or lesions Neurologic: Grossly normal Incision/Wound: na  Assessment/Plan Right carpal tunnel syndrome.  Discussed non operative and operative treatment measures.  He wishes to have right carpal tunnel release.  Risks, benefits, and alternatives of surgery were discussed and the patient agrees with the plan of care.   [place] Moss R 05/23/2011, 8:44 AM

## 2011-05-23 NOTE — Anesthesia Preprocedure Evaluation (Signed)
Anesthesia Evaluation  Patient identified by MRN, date of birth, ID band Patient awake    Reviewed: Allergy & Precautions, H&P , NPO status , Patient's Chart, lab work & pertinent test results, reviewed documented beta blocker date and time   Airway Mallampati: III TM Distance: >3 FB Neck ROM: Full    Dental  (+) Edentulous Upper and Partial Lower   Pulmonary  breath sounds clear to auscultation        Cardiovascular Rhythm:Regular Rate:Normal     Neuro/Psych    GI/Hepatic   Endo/Other    Renal/GU      Musculoskeletal   Abdominal   Peds  Hematology   Anesthesia Other Findings   Reproductive/Obstetrics                           Anesthesia Physical Anesthesia Plan  ASA: III  Anesthesia Plan: General   Post-op Pain Management:    Induction: Intravenous  Airway Management Planned: LMA  Additional Equipment:   Intra-op Plan:   Post-operative Plan:   Informed Consent: I have reviewed the patients History and Physical, chart, labs and discussed the procedure including the risks, benefits and alternatives for the proposed anesthesia with the patient or authorized representative who has indicated his/her understanding and acceptance.   Dental advisory given  Plan Discussed with:   Anesthesia Plan Comments: (Htn CAD s/p PTCA x2 Stable since 2000.  Plan GA with LMA  Kipp Brood, MD)        Anesthesia Quick Evaluation

## 2011-05-23 NOTE — Op Note (Signed)
Dictation [phone]

## 2011-05-23 NOTE — Anesthesia Postprocedure Evaluation (Signed)
  Anesthesia Post-op Note  Patient: William Norman  Procedure(s) Performed: Procedure(s) (LRB): CARPAL TUNNEL RELEASE (Right)  Patient Location: PACU  Anesthesia Type: General  Level of Consciousness: awake, alert  and oriented  Airway and Oxygen Therapy: Patient Spontanous Breathing and Patient connected to nasal cannula oxygen  Post-op Pain: mild  Post-op Assessment: Post-op Vital signs reviewed and Patient's Cardiovascular Status Stable  Post-op Vital Signs: stable  Complications: No apparent anesthesia complications

## 2011-05-23 NOTE — Transfer of Care (Signed)
Immediate Anesthesia Transfer of Care Note  Patient: William Norman  Procedure(s) Performed: Procedure(s) (LRB): CARPAL TUNNEL RELEASE (Right)  Patient Location: PACU  Anesthesia Type: General  Level of Consciousness: awake, alert , oriented and patient cooperative  Airway & Oxygen Therapy: Patient Spontanous Breathing and Patient connected to face mask oxygen  Post-op Assessment: Report given to PACU RN and Post -op Vital signs reviewed and stable  Post vital signs: Reviewed and stable  Complications: No apparent anesthesia complications

## 2011-05-24 ENCOUNTER — Encounter (HOSPITAL_BASED_OUTPATIENT_CLINIC_OR_DEPARTMENT_OTHER): Payer: Self-pay | Admitting: Orthopedic Surgery

## 2011-06-10 ENCOUNTER — Ambulatory Visit (INDEPENDENT_AMBULATORY_CARE_PROVIDER_SITE_OTHER): Payer: Medicare Other

## 2011-06-10 DIAGNOSIS — Z7901 Long term (current) use of anticoagulants: Secondary | ICD-10-CM

## 2011-06-10 LAB — POCT INR: INR: 2.1

## 2011-06-10 NOTE — Patient Instructions (Signed)
  Latest dosing instructions   Total Sun Mon Tue Wed Thu Fri Sat   35 5 mg 5 mg 5 mg 5 mg 5 mg 5 mg 5 mg    (5 mg1) (5 mg1) (5 mg1) (5 mg1) (5 mg1) (5 mg1) (5 mg1)        

## 2011-08-11 ENCOUNTER — Telehealth: Payer: Self-pay | Admitting: Family

## 2011-08-11 NOTE — Telephone Encounter (Signed)
Patient needs to schedule appointment to check INR.

## 2011-09-02 ENCOUNTER — Ambulatory Visit (INDEPENDENT_AMBULATORY_CARE_PROVIDER_SITE_OTHER): Payer: Medicare Other | Admitting: Family

## 2011-09-02 DIAGNOSIS — Z7901 Long term (current) use of anticoagulants: Secondary | ICD-10-CM

## 2011-09-02 LAB — POCT INR: INR: 2

## 2011-09-02 NOTE — Patient Instructions (Addendum)
  Latest dosing instructions   Total Sun Mon Tue Wed Thu Fri Sat   35 5 mg 5 mg 5 mg 5 mg 5 mg 5 mg 5 mg    (5 mg1) (5 mg1) (5 mg1) (5 mg1) (5 mg1) (5 mg1) (5 mg1)       Same dose, 5 mg Everyday, check in 6 weeks

## 2011-10-12 ENCOUNTER — Encounter: Payer: Medicare Other | Admitting: Family

## 2011-10-19 ENCOUNTER — Telehealth: Payer: Self-pay | Admitting: Family

## 2011-10-19 NOTE — Telephone Encounter (Signed)
Needs INR checked asap. Please schedule appointment

## 2011-10-19 NOTE — Telephone Encounter (Signed)
Patient overdue for INR check. Please schedule asap!

## 2011-10-20 NOTE — Telephone Encounter (Signed)
Left message to advise pt to call office to schedule pt/inr check asap

## 2011-10-24 ENCOUNTER — Ambulatory Visit (INDEPENDENT_AMBULATORY_CARE_PROVIDER_SITE_OTHER): Payer: Medicare Other | Admitting: Family

## 2011-10-24 DIAGNOSIS — Z7901 Long term (current) use of anticoagulants: Secondary | ICD-10-CM

## 2011-10-24 LAB — POCT INR: INR: 2.2

## 2011-10-24 NOTE — Patient Instructions (Addendum)
Same dose, 5 mg Everyday, check in 6 weeks    Latest dosing instructions   Total Sun Mon Tue Wed Thu Fri Sat   35 5 mg 5 mg 5 mg 5 mg 5 mg 5 mg 5 mg    (5 mg1) (5 mg1) (5 mg1) (5 mg1) (5 mg1) (5 mg1) (5 mg1)       \ 

## 2011-10-27 ENCOUNTER — Other Ambulatory Visit: Payer: Self-pay | Admitting: Family Medicine

## 2011-12-05 ENCOUNTER — Ambulatory Visit (INDEPENDENT_AMBULATORY_CARE_PROVIDER_SITE_OTHER): Payer: Medicare Other | Admitting: Family

## 2011-12-05 ENCOUNTER — Encounter: Payer: Medicare Other | Admitting: Family

## 2011-12-05 DIAGNOSIS — D68318 Other hemorrhagic disorder due to intrinsic circulating anticoagulants, antibodies, or inhibitors: Secondary | ICD-10-CM

## 2011-12-05 LAB — POCT INR: INR: 2.5

## 2011-12-05 NOTE — Patient Instructions (Addendum)
Same dose, 5 mg Everyday, check in 6 weeks    Latest dosing instructions   Total Sun Mon Tue Wed Thu Fri Sat   35 5 mg 5 mg 5 mg 5 mg 5 mg 5 mg 5 mg    (5 mg1) (5 mg1) (5 mg1) (5 mg1) (5 mg1) (5 mg1) (5 mg1)       \ 

## 2012-01-16 ENCOUNTER — Encounter: Payer: Medicare Other | Admitting: Family

## 2012-01-24 ENCOUNTER — Telehealth: Payer: Self-pay | Admitting: Family

## 2012-01-24 NOTE — Telephone Encounter (Signed)
Needs to have INR asap.

## 2012-01-24 NOTE — Telephone Encounter (Signed)
Left message for pt to call back to schedule pt/inr appt

## 2012-02-02 ENCOUNTER — Ambulatory Visit (INDEPENDENT_AMBULATORY_CARE_PROVIDER_SITE_OTHER): Payer: Medicare Other | Admitting: Family

## 2012-02-02 DIAGNOSIS — D68318 Other hemorrhagic disorder due to intrinsic circulating anticoagulants, antibodies, or inhibitors: Secondary | ICD-10-CM

## 2012-02-02 LAB — POCT INR: INR: 2.1

## 2012-02-02 NOTE — Patient Instructions (Signed)
Same dose, 5 mg Everyday, check in 6 weeks    Latest dosing instructions   Total Sun Mon Tue Wed Thu Fri Sat   35 5 mg 5 mg 5 mg 5 mg 5 mg 5 mg 5 mg    (5 mg1) (5 mg1) (5 mg1) (5 mg1) (5 mg1) (5 mg1) (5 mg1)       \ 

## 2012-02-03 ENCOUNTER — Other Ambulatory Visit: Payer: Self-pay | Admitting: Family Medicine

## 2012-03-16 ENCOUNTER — Ambulatory Visit (INDEPENDENT_AMBULATORY_CARE_PROVIDER_SITE_OTHER): Payer: Medicare Other | Admitting: Family

## 2012-03-16 DIAGNOSIS — Z7901 Long term (current) use of anticoagulants: Secondary | ICD-10-CM

## 2012-03-16 DIAGNOSIS — D68318 Other hemorrhagic disorder due to intrinsic circulating anticoagulants, antibodies, or inhibitors: Secondary | ICD-10-CM

## 2012-03-16 LAB — POCT INR: INR: 2.1

## 2012-03-16 NOTE — Patient Instructions (Addendum)
Same dose, 5 mg Everyday, check in 6 weeks    Latest dosing instructions   Total Sun Mon Tue Wed Thu Fri Sat   35 5 mg 5 mg 5 mg 5 mg 5 mg 5 mg 5 mg    (5 mg1) (5 mg1) (5 mg1) (5 mg1) (5 mg1) (5 mg1) (5 mg1)       \

## 2012-04-16 ENCOUNTER — Encounter: Payer: Medicare Other | Admitting: Family Medicine

## 2012-04-16 ENCOUNTER — Encounter: Payer: Medicare Other | Admitting: Family

## 2012-04-18 ENCOUNTER — Ambulatory Visit (INDEPENDENT_AMBULATORY_CARE_PROVIDER_SITE_OTHER): Payer: Medicare Other | Admitting: Family

## 2012-04-18 DIAGNOSIS — Z7901 Long term (current) use of anticoagulants: Secondary | ICD-10-CM

## 2012-04-18 DIAGNOSIS — D68318 Other hemorrhagic disorder due to intrinsic circulating anticoagulants, antibodies, or inhibitors: Secondary | ICD-10-CM

## 2012-04-18 LAB — POCT INR: INR: 2.2

## 2012-04-18 NOTE — Patient Instructions (Signed)
Same dose, 5 mg Everyday, check in 6 weeks  Anticoagulation Dose Instructions as of 04/18/2012     Glynis Smiles Tue Wed Thu Fri Sat   New Dose 5 mg 5 mg 5 mg 5 mg 5 mg 5 mg 5 mg    Description       Same dose, 5 mg Everyday, check in 6 weeks

## 2012-04-29 ENCOUNTER — Other Ambulatory Visit: Payer: Self-pay | Admitting: Family Medicine

## 2012-05-02 ENCOUNTER — Other Ambulatory Visit: Payer: Self-pay | Admitting: Family

## 2012-06-11 ENCOUNTER — Encounter: Payer: Medicare Other | Admitting: Family

## 2012-06-11 ENCOUNTER — Encounter: Payer: Medicare Other | Admitting: Family Medicine

## 2012-06-12 ENCOUNTER — Telehealth: Payer: Self-pay | Admitting: Family

## 2012-06-12 NOTE — Telephone Encounter (Signed)
Appointment for CPX and pt/inr was canceled for 06/11/12 and rescheduled for 07/11/12

## 2012-06-12 NOTE — Telephone Encounter (Signed)
Needs an INR visit ASAP!!

## 2012-06-21 ENCOUNTER — Other Ambulatory Visit: Payer: Self-pay | Admitting: Dermatology

## 2012-07-04 ENCOUNTER — Other Ambulatory Visit: Payer: Self-pay | Admitting: Family Medicine

## 2012-07-11 ENCOUNTER — Ambulatory Visit (INDEPENDENT_AMBULATORY_CARE_PROVIDER_SITE_OTHER): Payer: Medicare Other | Admitting: Family

## 2012-07-11 ENCOUNTER — Ambulatory Visit (INDEPENDENT_AMBULATORY_CARE_PROVIDER_SITE_OTHER): Payer: Medicare Other | Admitting: Family Medicine

## 2012-07-11 ENCOUNTER — Encounter: Payer: Self-pay | Admitting: Family Medicine

## 2012-07-11 VITALS — BP 160/98 | Temp 98.3°F | Ht 72.0 in | Wt 221.0 lb

## 2012-07-11 DIAGNOSIS — Z0000 Encounter for general adult medical examination without abnormal findings: Secondary | ICD-10-CM

## 2012-07-11 DIAGNOSIS — N401 Enlarged prostate with lower urinary tract symptoms: Secondary | ICD-10-CM

## 2012-07-11 DIAGNOSIS — D68318 Other hemorrhagic disorder due to intrinsic circulating anticoagulants, antibodies, or inhibitors: Secondary | ICD-10-CM

## 2012-07-11 DIAGNOSIS — M5137 Other intervertebral disc degeneration, lumbosacral region: Secondary | ICD-10-CM

## 2012-07-11 DIAGNOSIS — M51379 Other intervertebral disc degeneration, lumbosacral region without mention of lumbar back pain or lower extremity pain: Secondary | ICD-10-CM

## 2012-07-11 DIAGNOSIS — I2510 Atherosclerotic heart disease of native coronary artery without angina pectoris: Secondary | ICD-10-CM

## 2012-07-11 DIAGNOSIS — E785 Hyperlipidemia, unspecified: Secondary | ICD-10-CM

## 2012-07-11 DIAGNOSIS — N138 Other obstructive and reflux uropathy: Secondary | ICD-10-CM

## 2012-07-11 DIAGNOSIS — N529 Male erectile dysfunction, unspecified: Secondary | ICD-10-CM

## 2012-07-11 DIAGNOSIS — Z7901 Long term (current) use of anticoagulants: Secondary | ICD-10-CM

## 2012-07-11 DIAGNOSIS — Z125 Encounter for screening for malignant neoplasm of prostate: Secondary | ICD-10-CM

## 2012-07-11 DIAGNOSIS — D126 Benign neoplasm of colon, unspecified: Secondary | ICD-10-CM

## 2012-07-11 DIAGNOSIS — R609 Edema, unspecified: Secondary | ICD-10-CM

## 2012-07-11 DIAGNOSIS — I10 Essential (primary) hypertension: Secondary | ICD-10-CM

## 2012-07-11 DIAGNOSIS — K219 Gastro-esophageal reflux disease without esophagitis: Secondary | ICD-10-CM

## 2012-07-11 HISTORY — DX: Male erectile dysfunction, unspecified: N52.9

## 2012-07-11 LAB — CBC WITH DIFFERENTIAL/PLATELET
Basophils Absolute: 0.1 10*3/uL (ref 0.0–0.1)
Basophils Relative: 1.5 % (ref 0.0–3.0)
Eosinophils Absolute: 0.2 10*3/uL (ref 0.0–0.7)
Eosinophils Relative: 2.3 % (ref 0.0–5.0)
HCT: 44.8 % (ref 39.0–52.0)
Hemoglobin: 14.8 g/dL (ref 13.0–17.0)
Lymphocytes Relative: 28 % (ref 12.0–46.0)
Lymphs Abs: 1.9 10*3/uL (ref 0.7–4.0)
MCHC: 33.1 g/dL (ref 30.0–36.0)
MCV: 86.7 fl (ref 78.0–100.0)
Monocytes Absolute: 0.8 10*3/uL (ref 0.1–1.0)
Monocytes Relative: 12.2 % — ABNORMAL HIGH (ref 3.0–12.0)
Neutro Abs: 3.8 10*3/uL (ref 1.4–7.7)
Neutrophils Relative %: 56 % (ref 43.0–77.0)
Platelets: 224 10*3/uL (ref 150.0–400.0)
RBC: 5.17 Mil/uL (ref 4.22–5.81)
RDW: 15.3 % — ABNORMAL HIGH (ref 11.5–14.6)
WBC: 6.8 10*3/uL (ref 4.5–10.5)

## 2012-07-11 LAB — BASIC METABOLIC PANEL
BUN: 24 mg/dL — ABNORMAL HIGH (ref 6–23)
CO2: 28 mEq/L (ref 19–32)
Calcium: 10 mg/dL (ref 8.4–10.5)
Chloride: 107 mEq/L (ref 96–112)
Creatinine, Ser: 0.8 mg/dL (ref 0.4–1.5)
GFR: 103.83 mL/min (ref >60.00)
Glucose, Bld: 86 mg/dL (ref 70–99)
Potassium: 4.6 mEq/L (ref 3.5–5.1)
Sodium: 139 mEq/L (ref 135–145)

## 2012-07-11 LAB — HEPATIC FUNCTION PANEL
ALT: 24 U/L (ref 0–53)
AST: 30 U/L (ref 0–37)
Albumin: 4 g/dL (ref 3.5–5.2)
Alkaline Phosphatase: 50 U/L (ref 39–117)
Bilirubin, Direct: 0.2 mg/dL (ref 0.0–0.3)
Total Bilirubin: 0.9 mg/dL (ref 0.3–1.2)
Total Protein: 6.8 g/dL (ref 6.0–8.3)

## 2012-07-11 LAB — POCT URINALYSIS DIPSTICK
Bilirubin, UA: NEGATIVE
Glucose, UA: NEGATIVE
Ketones, UA: NEGATIVE
Nitrite, UA: NEGATIVE
Protein, UA: NEGATIVE
Spec Grav, UA: >=1.03
Urobilinogen, UA: 0.2
pH, UA: 5

## 2012-07-11 LAB — PSA: PSA: 1.99 ng/mL (ref 0.10–4.00)

## 2012-07-11 LAB — TSH: TSH: 3.16 u[IU]/mL (ref 0.35–5.50)

## 2012-07-11 LAB — POCT INR: INR: 2.6

## 2012-07-11 MED ORDER — WARFARIN SODIUM 5 MG PO TABS: ORAL_TABLET | ORAL | Status: DC

## 2012-07-11 MED ORDER — ATENOLOL 100 MG PO TABS: ORAL_TABLET | ORAL | Status: DC

## 2012-07-11 MED ORDER — SILDENAFIL CITRATE 100 MG PO TABS: 100.0000 mg | ORAL_TABLET | Freq: Every day | ORAL | Status: DC | PRN

## 2012-07-11 MED ORDER — HYDROCHLOROTHIAZIDE 25 MG PO TABS: ORAL_TABLET | ORAL | Status: DC

## 2012-07-11 MED ORDER — ATORVASTATIN CALCIUM 20 MG PO TABS: 20.0000 mg | ORAL_TABLET | Freq: Every day | ORAL | Status: DC

## 2012-07-11 NOTE — Addendum Note (Signed)
Addended byAdline Mango B on: 07/11/2012 09:00 AM   Modules accepted: Level of Service

## 2012-07-11 NOTE — Patient Instructions (Signed)
Check your blood pressure daily in the morning return in 4 weeks for followup with the data and the device  Continue all your other medications  OTC omeprazole when necessary for GERD  OTC steroid nasal spray when necessary for allergic rhinitis  Stop all caffeine,,,,,, no sodas no Tea

## 2012-07-11 NOTE — Patient Instructions (Addendum)
Same dose, 5 mg Everyday, check in 6 weeks  Anticoagulation Dose Instructions as of 07/11/2012     Glynis Smiles Tue Wed Thu Fri Sat   New Dose 5 mg 5 mg 5 mg 5 mg 5 mg 5 mg 5 mg    Description       Same dose, 5 mg Everyday, check in 6 weeks

## 2012-07-11 NOTE — Progress Notes (Signed)
Subjective:    Patient ID: William Norman, male    DOB: Mar 12, 1940, 72 y.o.   MRN: [id number]  HPI Mr. Abbe Amsterdam is a 72 year old married male nonsmoker who comes in today for a Medicare wellness examination  He takes 100 mg of Tenormin, lisinopril 40 mg for hypertension BP 160/98. Advised to do BP check daily and send [place] the data in 3 weeks  He takes Nexium when necessary for reflux esophagitis,  He gets a prescription of nitroglycerin to take when necessary but she's not had to use.  He takes Viagra 100 mg when necessary for ED  He's on chronic Coumadin therapy because of a history of [place] DVT for coronary disease and status post angioplasty.  He gets routine eye care, dental care, colonoscopy and GI, vaccinations up-to-date  Cognitive function normal he walks on a regular basis home health safety reviewed no issues identified, no guns in the house, he does have a health care power of attorney and living well  He saw his cardiologist Dr. Donnie Aho who did a lipid panel this month it was normal.   Review of Systems  Constitutional: Negative.   HENT: Negative.   Eyes: Negative.   Respiratory: Negative.   Cardiovascular: Negative.   Gastrointestinal: Negative.   Genitourinary: Negative.   Musculoskeletal: Negative.   Skin: Negative.   Neurological: Negative.   Psychiatric/Behavioral: Negative.        Objective:   Physical Exam  Constitutional: He is oriented to person, place, and time. He appears well-developed and well-nourished.  HENT:  Head: Normocephalic and atraumatic.  Right Ear: External ear normal.  Left Ear: External ear normal.  Nose: Nose normal.  Mouth/Throat: Oropharynx is clear and moist.  Eyes: Conjunctivae and EOM are normal. Pupils are equal, round, and reactive to light.  Neck: Normal range of motion. Neck supple. No JVD present. No tracheal deviation present. No thyromegaly present.  Cardiovascular: Normal rate, regular rhythm, normal heart  sounds and intact distal pulses.  Exam reveals no gallop and no friction rub.   No murmur heard. No carotid or aortic bruits peripheral pulses normal  Pulmonary/Chest: Effort normal and breath sounds normal. No stridor. No respiratory distress. He has no wheezes. He has no rales. He exhibits no tenderness.  Abdominal: Soft. Bowel sounds are normal. He exhibits no distension and no mass. There is no tenderness. There is no rebound and no guarding.  Umbilicus to pubis from previous partial 3 inches of colon removal for severe diverticulitis 4 years ago  Genitourinary: Rectum normal and penis normal. Guaiac negative stool. No penile tenderness.  3+ nonnodular BPH  Musculoskeletal: Normal range of motion. He exhibits no edema and no tenderness.  Lymphadenopathy:    He has no cervical adenopathy.  Neurological: He is alert and oriented to person, place, and time. He has normal reflexes. No cranial nerve deficit. He exhibits normal muscle tone.  Skin: Skin is warm and dry. No rash noted. No erythema. No pallor.  Total body skin exam normal except he has multiple moles and freckles capillary hemangiomas and seborrheic keratosis  Psychiatric: He has a normal mood and affect. His behavior is normal. Judgment and thought content normal.          Assessment & Plan:  Healthy male  Hypertension question ,,,,,,,,, control continue current blood pressure medication BP check daily followup in one month  Hyperlipidemia continue Lipitor 20 mg a day 8  Reflux esophagitis Nexium when necessary  Coronary disease status post PTCA nitroglycerin when  necessary  Erectile dysfunction Viagra when necessary  Chronic Coumadin therapy  BPH with urinary frequency and nocturia,,,,,,,,,,,, DC caffeine,,, he consumes tea and Pepsi  History of DVT

## 2012-08-07 ENCOUNTER — Telehealth: Payer: Self-pay | Admitting: Family

## 2012-08-07 NOTE — Telephone Encounter (Signed)
Pt has fup w/ Dr Tawanna Cooler on 7/10 @ 2:30.  His coum was 6/09 @ 2:00pm.  Is it ok if he gets his coum checked when he comes for his appt w/ Dr Tawanna Cooler on 7/10 so he does not have to come 2 days in a row?

## 2012-08-07 NOTE — Telephone Encounter (Signed)
That is fine. Please schedule

## 2012-08-09 ENCOUNTER — Ambulatory Visit: Payer: Medicare Other | Admitting: Family Medicine

## 2012-08-22 ENCOUNTER — Encounter: Payer: Medicare Other | Admitting: Family

## 2012-08-23 ENCOUNTER — Encounter: Payer: Self-pay | Admitting: Family Medicine

## 2012-08-23 ENCOUNTER — Ambulatory Visit (INDEPENDENT_AMBULATORY_CARE_PROVIDER_SITE_OTHER): Payer: Medicare Other | Admitting: General Practice

## 2012-08-23 ENCOUNTER — Ambulatory Visit (INDEPENDENT_AMBULATORY_CARE_PROVIDER_SITE_OTHER): Payer: Medicare Other | Admitting: Family Medicine

## 2012-08-23 VITALS — BP 130/84 | Temp 98.3°F | Wt 220.0 lb

## 2012-08-23 DIAGNOSIS — Z7901 Long term (current) use of anticoagulants: Secondary | ICD-10-CM

## 2012-08-23 DIAGNOSIS — I10 Essential (primary) hypertension: Secondary | ICD-10-CM

## 2012-08-23 DIAGNOSIS — D68318 Other hemorrhagic disorder due to intrinsic circulating anticoagulants, antibodies, or inhibitors: Secondary | ICD-10-CM

## 2012-08-23 LAB — POCT INR: INR: 2.3

## 2012-08-23 NOTE — Patient Instructions (Signed)
Restart the hydrochlorothiazide one tablet daily  Continue the Tenormin and lisinopril one of each daily in the morning  Drink lots of water  Avoid salt  Check your blood pressure right arm sitting position daily in the morning and fax me the data in 4 weeks,,,,,,,,,,,, [phone]

## 2012-08-23 NOTE — Progress Notes (Signed)
  Subjective:    Patient ID: William Norman, male    DOB: [birth year], 72 y.o.   MRN: [id number]  HPI William Norman is a 72 year old married male nonsmoker who comes in today for followup of hypertension  He's on 100 mg of Tenormin, lisinopril 40 mg daily  He stopped the hydrochlorothiazide. He says since he stopped the hydrochlorothiazide his feet are more swollen and his blood pressure went up. Diastolics mid 80s systolics 140-150   Review of Systems Review of systems negative    Objective:   Physical Exam  Well-developed well-nourished male no acute distress DP right arm sitting position with his cuff and our 140/80 145/85      Assessment & Plan:  Hypertension not quite at goal restart diuretic fax dated in 4 weeks

## 2012-10-04 ENCOUNTER — Ambulatory Visit: Payer: Medicare Other

## 2012-10-04 ENCOUNTER — Ambulatory Visit (INDEPENDENT_AMBULATORY_CARE_PROVIDER_SITE_OTHER): Payer: Medicare Other | Admitting: General Practice

## 2012-10-26 ENCOUNTER — Other Ambulatory Visit: Payer: Self-pay | Admitting: Family Medicine

## 2012-11-23 ENCOUNTER — Telehealth: Payer: Self-pay | Admitting: General Practice

## 2012-11-23 NOTE — Telephone Encounter (Signed)
Patient last picked up a 90 day refill of coumadin in September.  Last 2 90 day refills have been cancelled per Bailey Mech, RN.  Patient has not had INR checked in 3 months.  LMOM for patient to call clinic to schedule appointment.

## 2012-12-31 ENCOUNTER — Ambulatory Visit (INDEPENDENT_AMBULATORY_CARE_PROVIDER_SITE_OTHER): Payer: Medicare Other | Admitting: General Practice

## 2012-12-31 DIAGNOSIS — D68318 Other hemorrhagic disorder due to intrinsic circulating anticoagulants, antibodies, or inhibitors: Secondary | ICD-10-CM

## 2012-12-31 LAB — POCT INR: INR: 2.8

## 2012-12-31 NOTE — Progress Notes (Signed)
Pre-visit discussion using our clinic review tool. No additional management support is needed unless otherwise documented below in the visit note.  

## 2013-01-28 ENCOUNTER — Other Ambulatory Visit: Payer: Self-pay | Admitting: Family Medicine

## 2013-01-28 ENCOUNTER — Other Ambulatory Visit: Payer: Self-pay | Admitting: General Practice

## 2013-02-11 ENCOUNTER — Ambulatory Visit: Payer: Medicare Other

## 2013-03-11 ENCOUNTER — Ambulatory Visit (INDEPENDENT_AMBULATORY_CARE_PROVIDER_SITE_OTHER): Payer: Managed Care, Other (non HMO) | Admitting: General Practice

## 2013-03-11 DIAGNOSIS — Z5181 Encounter for therapeutic drug level monitoring: Secondary | ICD-10-CM | POA: Insufficient documentation

## 2013-03-11 DIAGNOSIS — D68318 Other hemorrhagic disorder due to intrinsic circulating anticoagulants, antibodies, or inhibitors: Secondary | ICD-10-CM

## 2013-03-11 LAB — POCT INR: INR: 2.1

## 2013-03-11 NOTE — Progress Notes (Signed)
Pre-visit discussion using our clinic review tool. No additional management support is needed unless otherwise documented below in the visit note.  

## 2013-04-22 ENCOUNTER — Ambulatory Visit (INDEPENDENT_AMBULATORY_CARE_PROVIDER_SITE_OTHER): Payer: Managed Care, Other (non HMO) | Admitting: General Practice

## 2013-04-22 DIAGNOSIS — Z7901 Long term (current) use of anticoagulants: Secondary | ICD-10-CM

## 2013-04-22 DIAGNOSIS — D68318 Other hemorrhagic disorder due to intrinsic circulating anticoagulants, antibodies, or inhibitors: Secondary | ICD-10-CM

## 2013-04-22 LAB — POCT INR: INR: 2

## 2013-04-22 NOTE — Progress Notes (Signed)
Pre visit review using our clinic review tool, if applicable. No additional management support is needed unless otherwise documented below in the visit note. 

## 2013-04-28 ENCOUNTER — Other Ambulatory Visit: Payer: Self-pay | Admitting: Family Medicine

## 2013-05-07 ENCOUNTER — Other Ambulatory Visit: Payer: Self-pay | Admitting: Family Medicine

## 2013-05-08 ENCOUNTER — Other Ambulatory Visit: Payer: Self-pay | Admitting: General Practice

## 2013-05-08 MED ORDER — WARFARIN SODIUM 5 MG PO TABS: ORAL_TABLET | ORAL | Status: DC

## 2013-06-03 ENCOUNTER — Ambulatory Visit (INDEPENDENT_AMBULATORY_CARE_PROVIDER_SITE_OTHER): Payer: Managed Care, Other (non HMO) | Admitting: General Practice

## 2013-06-03 DIAGNOSIS — Z5181 Encounter for therapeutic drug level monitoring: Secondary | ICD-10-CM

## 2013-06-03 LAB — POCT INR: INR: 3

## 2013-06-03 NOTE — Progress Notes (Signed)
Pre visit review using our clinic review tool, if applicable. No additional management support is needed unless otherwise documented below in the visit note. 

## 2013-07-15 ENCOUNTER — Ambulatory Visit (INDEPENDENT_AMBULATORY_CARE_PROVIDER_SITE_OTHER): Payer: Managed Care, Other (non HMO) | Admitting: General Practice

## 2013-07-15 ENCOUNTER — Encounter: Payer: Self-pay | Admitting: Family Medicine

## 2013-07-15 ENCOUNTER — Ambulatory Visit (INDEPENDENT_AMBULATORY_CARE_PROVIDER_SITE_OTHER): Payer: Managed Care, Other (non HMO) | Admitting: Family Medicine

## 2013-07-15 VITALS — BP 120/80 | Temp 98.6°F | Ht 71.5 in | Wt 222.0 lb

## 2013-07-15 DIAGNOSIS — N401 Enlarged prostate with lower urinary tract symptoms: Secondary | ICD-10-CM

## 2013-07-15 DIAGNOSIS — D68318 Other hemorrhagic disorder due to intrinsic circulating anticoagulants, antibodies, or inhibitors: Secondary | ICD-10-CM

## 2013-07-15 DIAGNOSIS — I10 Essential (primary) hypertension: Secondary | ICD-10-CM

## 2013-07-15 DIAGNOSIS — K219 Gastro-esophageal reflux disease without esophagitis: Secondary | ICD-10-CM

## 2013-07-15 DIAGNOSIS — R351 Nocturia: Secondary | ICD-10-CM

## 2013-07-15 DIAGNOSIS — R609 Edema, unspecified: Secondary | ICD-10-CM

## 2013-07-15 DIAGNOSIS — E785 Hyperlipidemia, unspecified: Secondary | ICD-10-CM

## 2013-07-15 DIAGNOSIS — Z5181 Encounter for therapeutic drug level monitoring: Secondary | ICD-10-CM

## 2013-07-15 DIAGNOSIS — N529 Male erectile dysfunction, unspecified: Secondary | ICD-10-CM

## 2013-07-15 DIAGNOSIS — N138 Other obstructive and reflux uropathy: Secondary | ICD-10-CM

## 2013-07-15 DIAGNOSIS — I82409 Acute embolism and thrombosis of unspecified deep veins of unspecified lower extremity: Secondary | ICD-10-CM

## 2013-07-15 DIAGNOSIS — I2510 Atherosclerotic heart disease of native coronary artery without angina pectoris: Secondary | ICD-10-CM

## 2013-07-15 DIAGNOSIS — K5730 Diverticulosis of large intestine without perforation or abscess without bleeding: Secondary | ICD-10-CM

## 2013-07-15 LAB — POCT URINALYSIS DIPSTICK
Bilirubin, UA: NEGATIVE
Glucose, UA: NEGATIVE
Ketones, UA: NEGATIVE
Nitrite, UA: POSITIVE
Spec Grav, UA: >=1.03
Urobilinogen, UA: 0.2
pH, UA: 6

## 2013-07-15 LAB — CBC WITH DIFFERENTIAL/PLATELET
Basophils Absolute: 0.1 10*3/uL (ref 0.0–0.1)
Basophils Relative: 0.9 % (ref 0.0–3.0)
Eosinophils Absolute: 0.1 10*3/uL (ref 0.0–0.7)
Eosinophils Relative: 1.5 % (ref 0.0–5.0)
HCT: 43.2 % (ref 39.0–52.0)
Hemoglobin: 14.4 g/dL (ref 13.0–17.0)
Lymphocytes Relative: 28.1 % (ref 12.0–46.0)
Lymphs Abs: 1.8 10*3/uL (ref 0.7–4.0)
MCHC: 33.4 g/dL (ref 30.0–36.0)
MCV: 85.7 fl (ref 78.0–100.0)
Monocytes Absolute: 0.8 10*3/uL (ref 0.1–1.0)
Monocytes Relative: 13.4 % — ABNORMAL HIGH (ref 3.0–12.0)
Neutro Abs: 3.5 10*3/uL (ref 1.4–7.7)
Neutrophils Relative %: 56.1 % (ref 43.0–77.0)
Platelets: 183 10*3/uL (ref 150.0–400.0)
RBC: 5.04 Mil/uL (ref 4.22–5.81)
RDW: 14.9 % (ref 11.5–15.5)
WBC: 6.3 10*3/uL (ref 4.0–10.5)

## 2013-07-15 LAB — LIPID PANEL
Cholesterol: 156 mg/dL (ref 0–200)
HDL: 46.2 mg/dL (ref >39.00)
LDL Cholesterol: 82 mg/dL (ref 0–99)
Total CHOL/HDL Ratio: 3
Triglycerides: 138 mg/dL (ref 0.0–149.0)
VLDL: 27.6 mg/dL (ref 0.0–40.0)

## 2013-07-15 LAB — BASIC METABOLIC PANEL
BUN: 21 mg/dL (ref 6–23)
CO2: 27 mEq/L (ref 19–32)
Calcium: 9.4 mg/dL (ref 8.4–10.5)
Chloride: 108 mEq/L (ref 96–112)
Creatinine, Ser: 0.9 mg/dL (ref 0.4–1.5)
GFR: 91.28 mL/min (ref >60.00)
Glucose, Bld: 88 mg/dL (ref 70–99)
Potassium: 4.5 mEq/L (ref 3.5–5.1)
Sodium: 140 mEq/L (ref 135–145)

## 2013-07-15 LAB — HEPATIC FUNCTION PANEL
ALT: 21 U/L (ref 0–53)
AST: 29 U/L (ref 0–37)
Albumin: 3.8 g/dL (ref 3.5–5.2)
Alkaline Phosphatase: 43 U/L (ref 39–117)
Bilirubin, Direct: 0.1 mg/dL (ref 0.0–0.3)
Total Bilirubin: 0.9 mg/dL (ref 0.2–1.2)
Total Protein: 6.2 g/dL (ref 6.0–8.3)

## 2013-07-15 LAB — POCT INR: INR: 2.7

## 2013-07-15 LAB — TSH: TSH: 2.4 u[IU]/mL (ref 0.35–4.50)

## 2013-07-15 LAB — PSA: PSA: 2.23 ng/mL (ref 0.10–4.00)

## 2013-07-15 MED ORDER — ATENOLOL 100 MG PO TABS: ORAL_TABLET | ORAL | Status: DC

## 2013-07-15 MED ORDER — DOXAZOSIN MESYLATE 1 MG PO TABS: ORAL_TABLET | ORAL | Status: DC

## 2013-07-15 MED ORDER — HYDROCHLOROTHIAZIDE 25 MG PO TABS: ORAL_TABLET | ORAL | Status: DC

## 2013-07-15 MED ORDER — SILDENAFIL CITRATE 100 MG PO TABS: 100.0000 mg | ORAL_TABLET | Freq: Every day | ORAL | Status: DC | PRN

## 2013-07-15 MED ORDER — WARFARIN SODIUM 5 MG PO TABS: ORAL_TABLET | ORAL | Status: DC

## 2013-07-15 MED ORDER — NITROGLYCERIN 0.4 MG SL SUBL: 0.4000 mg | SUBLINGUAL_TABLET | SUBLINGUAL | Status: DC | PRN

## 2013-07-15 MED ORDER — ESOMEPRAZOLE MAGNESIUM 40 MG PO CPDR: 40.0000 mg | DELAYED_RELEASE_CAPSULE | Freq: Every day | ORAL | Status: DC

## 2013-07-15 MED ORDER — ATORVASTATIN CALCIUM 20 MG PO TABS: 20.0000 mg | ORAL_TABLET | Freq: Every day | ORAL | Status: DC

## 2013-07-15 MED ORDER — LISINOPRIL 40 MG PO TABS: ORAL_TABLET | ORAL | Status: DC

## 2013-07-15 NOTE — Progress Notes (Signed)
Pre visit review using our clinic review tool, if applicable. No additional management support is needed unless otherwise documented below in the visit note. 

## 2013-07-15 NOTE — Patient Instructions (Addendum)
Continue your current medications  Walk 30 minutes daily  Return in one year for general checkup sooner if any problem  Cardura 1 mg............ one tablet at bedtime for BPH  Avoid caffeine  Followup in 4-6 weeks

## 2013-07-15 NOTE — Progress Notes (Signed)
   Subjective:    Patient ID: William Norman, male    DOB: 04/04/1940, 73 y.o.   MRN: [id number]  HPI William Norman is a 73 year old married male nonsmoker who comes in for general checkup and review all his medications  His med list reviewed is correct  His underlying problems include coronary disease, hypertension, hyperlipidemia, reflux esophagitis, erectile dysfunction and history of DVT  He gets routine eye care,,,,,, cataract extraction lens implant last August,,,,,,,, regular dental care, colonoscopy 2010 showed some diverticuli and polyps. 2 months after the procedure had a ruptured diverticulum.  Cognitive function normal he walks on a regular basis home health safety reviewed no issues identified, no no guns in the house, he does have a health care power of attorney and living well  Vaccinations updated by Apolonio Schneiders  Over the last year his urine flow is decreased he now has significant nocturia sometimes every hour. He's also drink a lot of sodas   Review of Systems  Constitutional: Negative.   HENT: Negative.   Eyes: Negative.   Respiratory: Negative.   Cardiovascular: Negative.   Gastrointestinal: Negative.   Genitourinary: Negative.   Musculoskeletal: Negative.   Skin: Negative.   Neurological: Negative.   Psychiatric/Behavioral: Negative.        Objective:   Physical Exam  Nursing note and vitals reviewed. Constitutional: He is oriented to person, place, and time. He appears well-developed and well-nourished.  HENT:  Head: Normocephalic and atraumatic.  Right Ear: External ear normal.  Left Ear: External ear normal.  Nose: Nose normal.  Mouth/Throat: Oropharynx is clear and moist.  Eyes: Conjunctivae and EOM are normal. Pupils are equal, round, and reactive to light.  Neck: Normal range of motion. Neck supple. No JVD present. No tracheal deviation present. No thyromegaly present.  Cardiovascular: Normal rate, regular rhythm and intact distal pulses.  Exam reveals  no gallop and no friction rub.   No murmur heard. No carotid no aortic bruits  Heart sounds audible but distant  Pulmonary/Chest: Effort normal and breath sounds normal. No stridor. No respiratory distress. He has no wheezes. He has no rales. He exhibits no tenderness.  Abdominal: Soft. Bowel sounds are normal. He exhibits no distension and no mass. There is no tenderness. There is no rebound and no guarding.  Large panniculus  Genitourinary: Rectum normal and penis normal. Guaiac negative stool. No penile tenderness.  3+ symmetrical BPH  Musculoskeletal: Normal range of motion. He exhibits no edema and no tenderness.  Lymphadenopathy:    He has no cervical adenopathy.  Neurological: He is alert and oriented to person, place, and time. He has normal reflexes. No cranial nerve deficit. He exhibits normal muscle tone.  Skin: Skin is warm and dry. No rash noted. No erythema. No pallor.  Psychiatric: He has a normal mood and affect. His behavior is normal. Judgment and thought content normal.          Assessment & Plan:  Hypertension at goal continue current therapy  Coronary artery disease asymptomatic  Hyperlipidemia continue Lipitor  BPH relatively asymptomatic therefore no therapy at this time  History of ruptured diverticulum  Reflux esophagitis continue Nexium  Erectile dysfunction continue Viagra  BPH with outlet obstruction

## 2013-07-18 ENCOUNTER — Encounter: Payer: Self-pay | Admitting: *Deleted

## 2013-07-18 ENCOUNTER — Encounter: Payer: Self-pay | Admitting: Cardiology

## 2013-07-18 ENCOUNTER — Other Ambulatory Visit: Payer: Self-pay | Admitting: *Deleted

## 2013-07-18 ENCOUNTER — Encounter: Payer: Self-pay | Admitting: Family Medicine

## 2013-07-18 DIAGNOSIS — Z86718 Personal history of other venous thrombosis and embolism: Secondary | ICD-10-CM

## 2013-07-18 DIAGNOSIS — I872 Venous insufficiency (chronic) (peripheral): Secondary | ICD-10-CM

## 2013-07-18 DIAGNOSIS — Z7901 Long term (current) use of anticoagulants: Secondary | ICD-10-CM | POA: Insufficient documentation

## 2013-07-18 DIAGNOSIS — M5137 Other intervertebral disc degeneration, lumbosacral region: Secondary | ICD-10-CM

## 2013-07-18 DIAGNOSIS — I2510 Atherosclerotic heart disease of native coronary artery without angina pectoris: Secondary | ICD-10-CM | POA: Insufficient documentation

## 2013-07-18 MED ORDER — ATORVASTATIN CALCIUM 40 MG PO TABS: 40.0000 mg | ORAL_TABLET | Freq: Every day | ORAL | Status: DC

## 2013-07-18 NOTE — Progress Notes (Unsigned)
Patient ID: William Norman, male   DOB: 01/18/1941, 73 y.o.   MRN: [id number]   William, Norman  Date of visit:  07/18/2013 DOB:  August 18, 1940    Age:  73 yrs. Medical record number:  [id number]     Account number:  [id number] Primary Care Provider: TODD, JEFFREY A ____________________________ CURRENT DIAGNOSES  1. CAD,Native  2. Hypertension-Essential (Benign)  3. Hyperlipidemia  4. Obesity(BMI30-40)  5. Venous (peripheral) Insufficiency, Unspecified  6. Personal history of venous thrombosis and embollism  7. Surgery-PTCA  8. Long Term Use Anticoagulant ____________________________ ALLERGIES  Simvastatin, Muscle aches ____________________________ MEDICATIONS  1. nitroglycerin 0.4 mg Tablet, Sublingual, PRN  2. atenolol 100 mg Tablet, 1 p.o. daily  3. warfarin 5 mg Tablet, 5mg  qd  4. lisinopril 40 mg Tablet, 1 p.o. daily  5. Co Q-10 100 mg Capsule, 1 p.o. daily  6. niacin 500 mg tablet, 1 p.o. daily  7. Nexium 40 mg capsule,delayed release(DR/EC), PRN  8. atorvastatin 40 mg tablet, 1/2 tab daily  9. doxazosin 1 mg tablet, 1 p.o. daily  10. hydrochlorothiazide 25 mg tablet, 2 p.o. daily ____________________________ CHIEF COMPLAINTS  Followup of CAD,Native ____________________________ HISTORY OF PRESENT ILLNESS  Patient seen for cardiac followup. He has been doing well since he was previously here. He is not exercising on a regular basis and his lipids were reviewed today and show excellent control. He denies angina and has no PND, orthopnea, syncope, palpitations, or claudication. He remains on warfarin without any bleeding complications first prior history of DVT. He has a mild amount of edema. ____________________________ PAST HISTORY  Past Medical Illnesses:  hypertension, hyperlipidemia, obesity, Deep vein thrombosis1988 with recurrence bilateal in 2002  on chronic coumadin, GERD, BPH, lumbar disc disease, carpal tunnel syndrome;  Cardiovascular Illnesses:  CAD;  Surgical  Procedures:  inguinal herniorrhaphy-rt, deviated septum repair, emergency laparotomy with partial colon resection June 2010, closure of colostomy and repair of hernia April 2011;  Cardiology Procedures-Invasive:  cardiac cath (left) July 1992, PTCA of the LAD July 1992, PTCA of the RCA February 1986;  Cardiology Procedures-Noninvasive:  treadmill cardiolite January, 2011;  Cardiac Cath Results:  normal Left main, 80% stenosis proximal LAD, no significant disease CFX, 40% stenosis proximal RCA, widely patent RCA at the previous angioplasty site, PTCA of LAD done after unsuccessful atherectomy;  LVEF of 66% documented via nuclear study on 02/19/2009,   ____________________________ CARDIO-PULMONARY TEST DATES EKG Date:  06/14/2012;   Cardiac Cath Date:  08/13/1990;  Stent Placement Date: 08/16/1990;  Nuclear Study Date:  02/19/2009;  Chest Xray Date: 05/07/1998;   ____________________________ FAMILY HISTORY Brother -- Brother dead, Myocardial infarction Father -- Father dead, Myocardial infarction Mother -- Mother dead, Heart disease ____________________________ SOCIAL HISTORY Alcohol Use:  drinks occasionally;  Smoking:  used to smoke but quit 1998;  Diet:  regular diet;  Lifestyle:  married;  Exercise:  some exercise;  Occupation:  retired AT;  Residence:  lives with wife;   ____________________________ REVIEW OF SYSTEMS General:  obesity Eyes: denies diplopia, history of glaucoma or visual problems. Ears, Nose, Throat, Mouth:  seasonal sinusitis Respiratory: denies dyspnea, cough, wheezing or hemoptysis. Cardiovascular:  please review HPI Abdominal: constipation Genitourinary-Male: hesitancy, nocturia, denies erectile dysfunction  Musculoskeletal:  Chronic back pain, prior DVT and venous insufficiency, mild edema  Neurological:  denies headaches, stroke, or TIA  ____________________________ PHYSICAL EXAMINATION VITAL SIGNS  Blood Pressure:  114/70 Sitting, Left arm, regular cuff  , 110/68 Standing,  Left arm and regular cuff   Pulse:  76/min. Weight:  220.00 lbs. Height:  72"BMI: 30  Constitutional:  pleasant white male in no acute distress, mildly obese Skin:  warm and dry to touch, no apparent skin lesions, or masses noted. Head:  normocephalic, normal hair pattern, no masses or tenderness ENT:  ears, nose and throat unremarkable, upper dentures present Neck:  supple, no masses, thyromegaly, JVD. Carotid pulses are full and equal bilaterally without bruits. Chest:  normal symmetry, clear to auscultation. Cardiac:  regular rhythm, normal S1 and S2, No S3 or S4, no murmurs, gallops or rubs detected. Peripheral Pulses:  the femoral,dorsalis pedis, and posterior tibial pulses are full and equal bilaterally with no bruits auscultated. Extremities & Back:  bilateral venous insufficiency changes present, trace edema Neurological:  no gross motor or sensory deficits noted, affect appropriate, oriented x3. ____________________________ MOST RECENT LIPID PANEL 07/15/13  CHOL TOTL 156 mg/dl, LDL 82 NM, HDL 46 mg/dl, TRIGLYCER 138 mg/dl and CHOL/HDL 3 (Calc) ____________________________ IMPRESSIONS/PLAN  1. Coronary artery disease with previous percutaneous intervention of the LAD and right coronary artery over 20 years ago with no angina 2. Hyperlipidemia currently well controlled 3. Overweight with weight loss since here 4. Hypertension controlled 5. History of DVT  Recommendations:  Encouraged him on his weight loss. He is now no longer obese. He is having no angina at the present time and his lipids are controlled. Recommend followup in one year with an EKG. Labs reviewed from primary physician. ____________________________ TODAYS ORDERS  1. Return Visit: 1 year  2. 12 Lead EKG: 1 year                       ____________________________ Cardiology Physician:  Kerry Hough MD [facility]

## 2013-08-26 ENCOUNTER — Ambulatory Visit: Payer: Managed Care, Other (non HMO) | Admitting: Family Medicine

## 2013-08-26 ENCOUNTER — Ambulatory Visit: Payer: Managed Care, Other (non HMO)

## 2013-08-29 ENCOUNTER — Ambulatory Visit (INDEPENDENT_AMBULATORY_CARE_PROVIDER_SITE_OTHER): Payer: Managed Care, Other (non HMO) | Admitting: Family Medicine

## 2013-08-29 ENCOUNTER — Ambulatory Visit (INDEPENDENT_AMBULATORY_CARE_PROVIDER_SITE_OTHER): Payer: Managed Care, Other (non HMO) | Admitting: General Practice

## 2013-08-29 ENCOUNTER — Encounter: Payer: Self-pay | Admitting: Family Medicine

## 2013-08-29 VITALS — BP 120/80 | Temp 98.4°F | Wt 225.0 lb

## 2013-08-29 DIAGNOSIS — N401 Enlarged prostate with lower urinary tract symptoms: Secondary | ICD-10-CM

## 2013-08-29 DIAGNOSIS — R351 Nocturia: Secondary | ICD-10-CM

## 2013-08-29 DIAGNOSIS — N138 Other obstructive and reflux uropathy: Secondary | ICD-10-CM

## 2013-08-29 DIAGNOSIS — I82409 Acute embolism and thrombosis of unspecified deep veins of unspecified lower extremity: Secondary | ICD-10-CM

## 2013-08-29 DIAGNOSIS — Z86718 Personal history of other venous thrombosis and embolism: Secondary | ICD-10-CM

## 2013-08-29 DIAGNOSIS — Z5181 Encounter for therapeutic drug level monitoring: Secondary | ICD-10-CM

## 2013-08-29 HISTORY — DX: Benign prostatic hyperplasia with lower urinary tract symptoms: N40.1

## 2013-08-29 LAB — POCT INR: INR: 2.1

## 2013-08-29 MED ORDER — DOXAZOSIN MESYLATE 4 MG PO TABS: 4.0000 mg | ORAL_TABLET | Freq: Every day | ORAL | Status: DC

## 2013-08-29 NOTE — Progress Notes (Signed)
Pre visit review using our clinic review tool, if applicable. No additional management support is needed unless otherwise documented below in the visit note. 

## 2013-08-29 NOTE — Progress Notes (Signed)
   Subjective:    Patient ID: William Norman, male    DOB: 30-Dec-1940, 73 y.o.   MRN: [id number]  HPI William Norman is a 73 year old male who comes in today for followup of BPH  We saw about 6 weeks ago with symptomatic BPH and nocturia x3-4  We started her on Cardura 1 mg daily. He comes back today for followup. He notices that increase in his urine flow or stream   Review of Systems Review of systems negative no side effects    Objective:   Physical Exam  Well-developed well-nourished male no acute distress vital signs stable he is afebrile     Assessment & Plan:  BPH with nocturia,,,,,,,,,,, increase Cardura to 4 mg,,,,,,,,,,,,,,, 8 mg of 4 does not help,,,,,,,,,,,, switch to other drugs when necessary,

## 2013-08-29 NOTE — Patient Instructions (Signed)
Cardura 4 mg.............. one tablet nightly at bedtime  If in 6 weeks she don't see any improvement call and we'll increase the dose to 8 mg.  Date milligrams doesn't work we'll switch to another drug  Voicemail to [place] when necessary

## 2013-09-09 ENCOUNTER — Other Ambulatory Visit: Payer: Self-pay | Admitting: *Deleted

## 2013-09-09 MED ORDER — DOXAZOSIN MESYLATE 8 MG PO TABS: 8.0000 mg | ORAL_TABLET | Freq: Every day | ORAL | Status: DC

## 2013-10-05 ENCOUNTER — Encounter: Payer: Self-pay | Admitting: Family Medicine

## 2013-10-10 ENCOUNTER — Ambulatory Visit (INDEPENDENT_AMBULATORY_CARE_PROVIDER_SITE_OTHER): Payer: Managed Care, Other (non HMO) | Admitting: *Deleted

## 2013-10-10 ENCOUNTER — Ambulatory Visit: Payer: Managed Care, Other (non HMO)

## 2013-10-10 ENCOUNTER — Ambulatory Visit (INDEPENDENT_AMBULATORY_CARE_PROVIDER_SITE_OTHER): Payer: Managed Care, Other (non HMO) | Admitting: Family

## 2013-10-10 DIAGNOSIS — Z23 Encounter for immunization: Secondary | ICD-10-CM

## 2013-10-10 DIAGNOSIS — I2510 Atherosclerotic heart disease of native coronary artery without angina pectoris: Secondary | ICD-10-CM

## 2013-10-10 DIAGNOSIS — Z7901 Long term (current) use of anticoagulants: Secondary | ICD-10-CM

## 2013-10-10 LAB — POCT INR: INR: 3

## 2013-10-10 NOTE — Patient Instructions (Signed)
Continue to take 5 mg daily.  Re-check in 6 weeks.  Anticoagulation Dose Instructions as of 10/10/2013     William Norman Tue Wed Thu Fri Sat   New Dose 5 mg 5 mg 5 mg 5 mg 5 mg 5 mg 5 mg    Description       Continue to take 5 mg daily.  Re-check in 6 weeks.

## 2013-11-21 ENCOUNTER — Ambulatory Visit: Payer: Managed Care, Other (non HMO)

## 2013-11-25 ENCOUNTER — Ambulatory Visit (INDEPENDENT_AMBULATORY_CARE_PROVIDER_SITE_OTHER): Payer: Managed Care, Other (non HMO) | Admitting: Family

## 2013-11-25 DIAGNOSIS — Z7901 Long term (current) use of anticoagulants: Secondary | ICD-10-CM

## 2013-11-25 LAB — POCT INR: INR: 2.8

## 2013-11-25 NOTE — Patient Instructions (Signed)
Continue to take 5 mg daily.  Re-check in 6 weeks. Anticoagulation Dose Instructions as of 11/25/2013     Dorene Grebe Tue Wed Thu Fri Sat   New Dose 5 mg 5 mg 5 mg 5 mg 5 mg 5 mg 5 mg    Description       Continue to take 5 mg daily.  Re-check in 6 weeks.

## 2014-01-06 ENCOUNTER — Ambulatory Visit (INDEPENDENT_AMBULATORY_CARE_PROVIDER_SITE_OTHER): Payer: Managed Care, Other (non HMO) | Admitting: Family

## 2014-01-06 DIAGNOSIS — Z5181 Encounter for therapeutic drug level monitoring: Secondary | ICD-10-CM

## 2014-01-06 LAB — POCT INR: INR: 1.9

## 2014-01-06 NOTE — Patient Instructions (Signed)
Take an extra 1/2 tab today only. Continue to take 5 mg daily.  Re-check in 6 weeks.  Anticoagulation Dose Instructions as of 01/06/2014      William Norman Tue Wed Thu Fri Sat   New Dose 5 mg 5 mg 5 mg 5 mg 5 mg 5 mg 5 mg    Description        Take an extra 1/2 tab today only. Continue to take 5 mg daily.  Re-check in 6 weeks.

## 2014-02-15 ENCOUNTER — Other Ambulatory Visit: Payer: Self-pay | Admitting: Family Medicine

## 2014-02-17 ENCOUNTER — Ambulatory Visit: Payer: Managed Care, Other (non HMO) | Admitting: Family

## 2014-02-24 ENCOUNTER — Other Ambulatory Visit: Payer: Self-pay | Admitting: *Deleted

## 2014-02-24 MED ORDER — LISINOPRIL 40 MG PO TABS: 40.0000 mg | ORAL_TABLET | Freq: Every day | ORAL | Status: DC

## 2014-03-05 ENCOUNTER — Telehealth: Payer: Self-pay | Admitting: Family

## 2014-03-05 NOTE — Telephone Encounter (Signed)
LMOM

## 2014-03-05 NOTE — Telephone Encounter (Signed)
Needs PT/INR appt asap.  

## 2014-03-21 ENCOUNTER — Encounter (HOSPITAL_COMMUNITY): Payer: Self-pay | Admitting: Emergency Medicine

## 2014-03-21 ENCOUNTER — Emergency Department (HOSPITAL_COMMUNITY): Payer: Medicare HMO

## 2014-03-21 ENCOUNTER — Inpatient Hospital Stay (HOSPITAL_COMMUNITY)
Admission: EM | Admit: 2014-03-21 | Discharge: 2014-03-25 | DRG: 390 | Disposition: A | Discharge status: Home / Self Care | Payer: Medicare HMO | Attending: Internal Medicine | Admitting: Internal Medicine

## 2014-03-21 DIAGNOSIS — Z79899 Other long term (current) drug therapy: Secondary | ICD-10-CM

## 2014-03-21 DIAGNOSIS — Z8249 Family history of ischemic heart disease and other diseases of the circulatory system: Secondary | ICD-10-CM | POA: Diagnosis not present

## 2014-03-21 DIAGNOSIS — K56609 Unspecified intestinal obstruction, unspecified as to partial versus complete obstruction: Secondary | ICD-10-CM

## 2014-03-21 DIAGNOSIS — Z86718 Personal history of other venous thrombosis and embolism: Secondary | ICD-10-CM | POA: Diagnosis not present

## 2014-03-21 DIAGNOSIS — N529 Male erectile dysfunction, unspecified: Secondary | ICD-10-CM | POA: Diagnosis present

## 2014-03-21 DIAGNOSIS — K219 Gastro-esophageal reflux disease without esophagitis: Secondary | ICD-10-CM | POA: Diagnosis present

## 2014-03-21 DIAGNOSIS — K5660 Unspecified intestinal obstruction: Secondary | ICD-10-CM | POA: Diagnosis present

## 2014-03-21 DIAGNOSIS — N400 Enlarged prostate without lower urinary tract symptoms: Secondary | ICD-10-CM | POA: Diagnosis present

## 2014-03-21 DIAGNOSIS — Z7901 Long term (current) use of anticoagulants: Secondary | ICD-10-CM | POA: Diagnosis not present

## 2014-03-21 DIAGNOSIS — R109 Unspecified abdominal pain: Secondary | ICD-10-CM | POA: Diagnosis present

## 2014-03-21 DIAGNOSIS — I2510 Atherosclerotic heart disease of native coronary artery without angina pectoris: Secondary | ICD-10-CM | POA: Diagnosis present

## 2014-03-21 DIAGNOSIS — E785 Hyperlipidemia, unspecified: Secondary | ICD-10-CM | POA: Diagnosis present

## 2014-03-21 DIAGNOSIS — Z9049 Acquired absence of other specified parts of digestive tract: Secondary | ICD-10-CM | POA: Diagnosis present

## 2014-03-21 DIAGNOSIS — Z9861 Coronary angioplasty status: Secondary | ICD-10-CM

## 2014-03-21 DIAGNOSIS — I10 Essential (primary) hypertension: Secondary | ICD-10-CM | POA: Diagnosis present

## 2014-03-21 DIAGNOSIS — Z87891 Personal history of nicotine dependence: Secondary | ICD-10-CM

## 2014-03-21 LAB — CBC WITH DIFFERENTIAL/PLATELET
Basophils Absolute: 0.1 10*3/uL (ref 0.0–0.1)
Basophils Relative: 1 % (ref 0–1)
Eosinophils Absolute: 0.1 10*3/uL (ref 0.0–0.7)
Eosinophils Relative: 2 % (ref 0–5)
HCT: 46.2 % (ref 39.0–52.0)
Hemoglobin: 15.3 g/dL (ref 13.0–17.0)
Lymphocytes Relative: 43 % (ref 12–46)
Lymphs Abs: 2.5 10*3/uL (ref 0.7–4.0)
MCH: 29.4 pg (ref 26.0–34.0)
MCHC: 33.1 g/dL (ref 30.0–36.0)
MCV: 88.7 fL (ref 78.0–100.0)
Monocytes Absolute: 0.6 10*3/uL (ref 0.1–1.0)
Monocytes Relative: 11 % (ref 3–12)
Neutro Abs: 2.4 10*3/uL (ref 1.7–7.7)
Neutrophils Relative %: 43 % (ref 43–77)
Platelets: 194 10*3/uL (ref 150–400)
RBC: 5.21 MIL/uL (ref 4.22–5.81)
RDW: 14.7 % (ref 11.5–15.5)
WBC: 5.7 10*3/uL (ref 4.0–10.5)

## 2014-03-21 LAB — PROTIME-INR
INR: 2.24 — ABNORMAL HIGH (ref 0.00–1.49)
INR: 1.92 — ABNORMAL HIGH (ref 0.00–1.49)
Prothrombin Time: 25 seconds — ABNORMAL HIGH (ref 11.6–15.2)
Prothrombin Time: 22.1 seconds — ABNORMAL HIGH (ref 11.6–15.2)

## 2014-03-21 LAB — I-STAT CG4 LACTIC ACID, ED: Lactic Acid, Venous: 1.03 mmol/L (ref 0.5–2.0)

## 2014-03-21 LAB — COMPREHENSIVE METABOLIC PANEL
ALT: 29 U/L (ref 0–53)
AST: 42 U/L — ABNORMAL HIGH (ref 0–37)
Albumin: 4.1 g/dL (ref 3.5–5.2)
Alkaline Phosphatase: 49 U/L (ref 39–117)
Anion gap: 5 (ref 5–15)
BUN: 12 mg/dL (ref 6–23)
CO2: 30 mmol/L (ref 19–32)
Calcium: 10.1 mg/dL (ref 8.4–10.5)
Chloride: 106 mmol/L (ref 96–112)
Creatinine, Ser: 0.94 mg/dL (ref 0.50–1.35)
GFR calc Af Amer: >90 mL/min (ref >90)
GFR calc non Af Amer: 80 mL/min — ABNORMAL LOW (ref >90)
Glucose, Bld: 109 mg/dL — ABNORMAL HIGH (ref 70–99)
Potassium: 4.1 mmol/L (ref 3.5–5.1)
Sodium: 141 mmol/L (ref 135–145)
Total Bilirubin: 0.7 mg/dL (ref 0.3–1.2)
Total Protein: 6.8 g/dL (ref 6.0–8.3)

## 2014-03-21 LAB — URINE MICROSCOPIC-ADD ON

## 2014-03-21 LAB — URINALYSIS, ROUTINE W REFLEX MICROSCOPIC
Bilirubin Urine: NEGATIVE
Glucose, UA: NEGATIVE mg/dL
Ketones, ur: NEGATIVE mg/dL
Nitrite: NEGATIVE
Protein, ur: NEGATIVE mg/dL
Specific Gravity, Urine: 1.017 (ref 1.005–1.030)
Urobilinogen, UA: 0.2 mg/dL (ref 0.0–1.0)
pH: 5 (ref 5.0–8.0)

## 2014-03-21 LAB — APTT: aPTT: 38 seconds — ABNORMAL HIGH (ref 24–37)

## 2014-03-21 LAB — LIPASE, BLOOD: Lipase: 26 U/L (ref 11–59)

## 2014-03-21 IMAGING — CT CT ABD-PELV W/ CM
2 of 5 series · 8 of 46 positions shown, 9 images · IV contrast (omnipaque)
Comparison: CT of the abdomen and pelvis [DATE]

CLINICAL DATA: Mid abdominal pain beginning last night, worsening
since arrival in emergency department. History of constipation.
History of hernia repair and reflux disease.

EXAM:
CT ABDOMEN AND PELVIS WITH CONTRAST
TECHNIQUE: Multidetector CT imaging of the abdomen and pelvis was performed
using the standard protocol following bolus administration of
intravenous contrast.
CONTRAST:  100mL OMNIPAQUE IOHEXOL 300 MG/ML  SOLN

[Series 201: routine, idose (2) · axial · 0.90mm/px · z∈[+62,+477]mm · 5 of 105 slices shown, 6 images]
[im 11/105  soft-tissue]
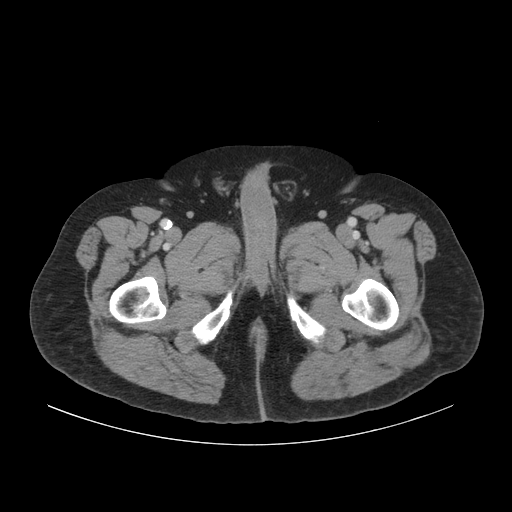
[im 11/105  bone]
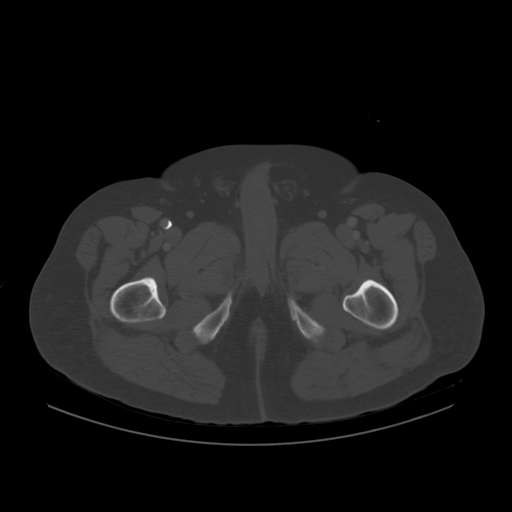
[im 33/105  soft-tissue]
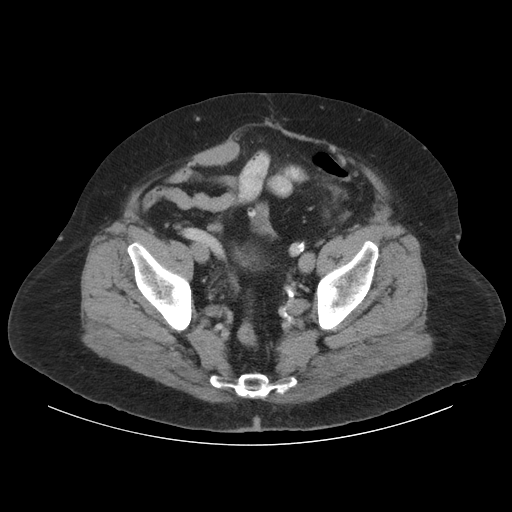
[im 55/105  soft-tissue]
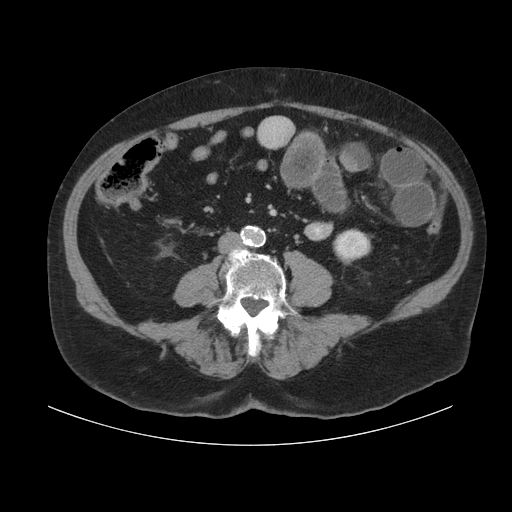
[im 72/105  soft-tissue]
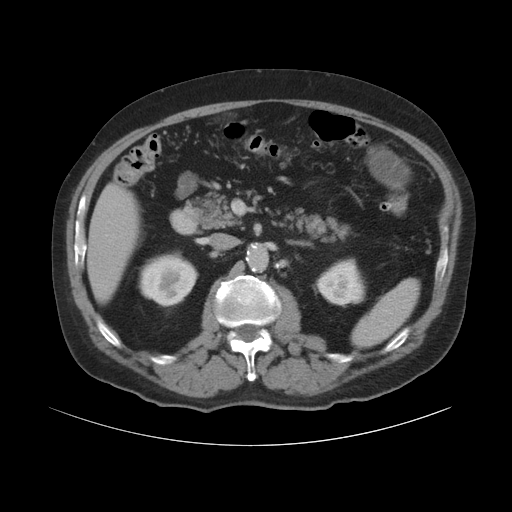
[im 94/105  soft-tissue]
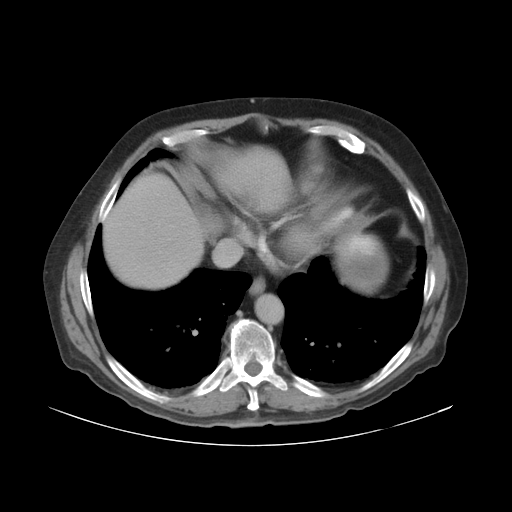

[Series 202: coronals, idose (2) · coronal · 0.45mm/px · 3 of 131 slices shown]
[im 44/131  soft-tissue]
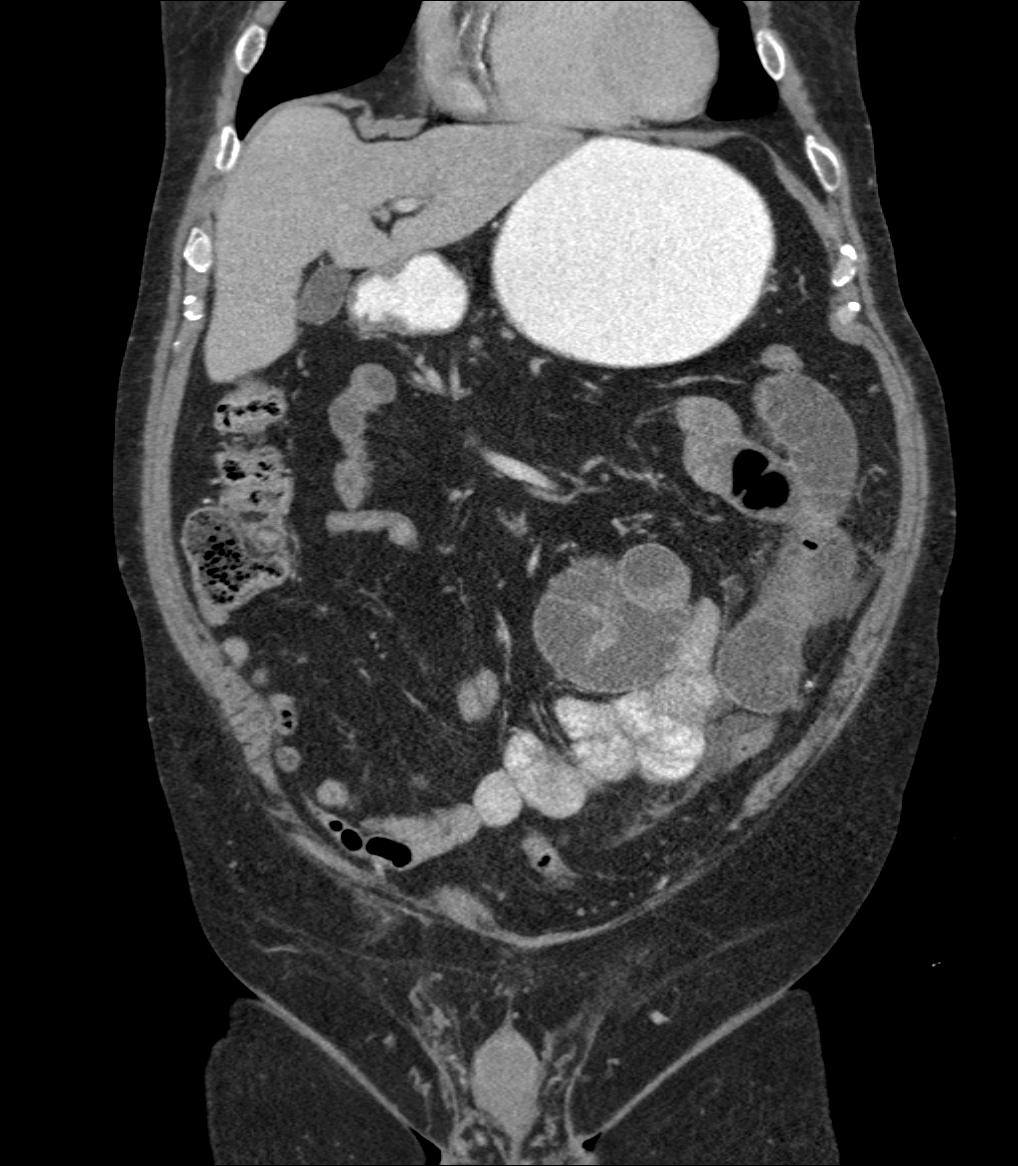
[im 58/131  soft-tissue]
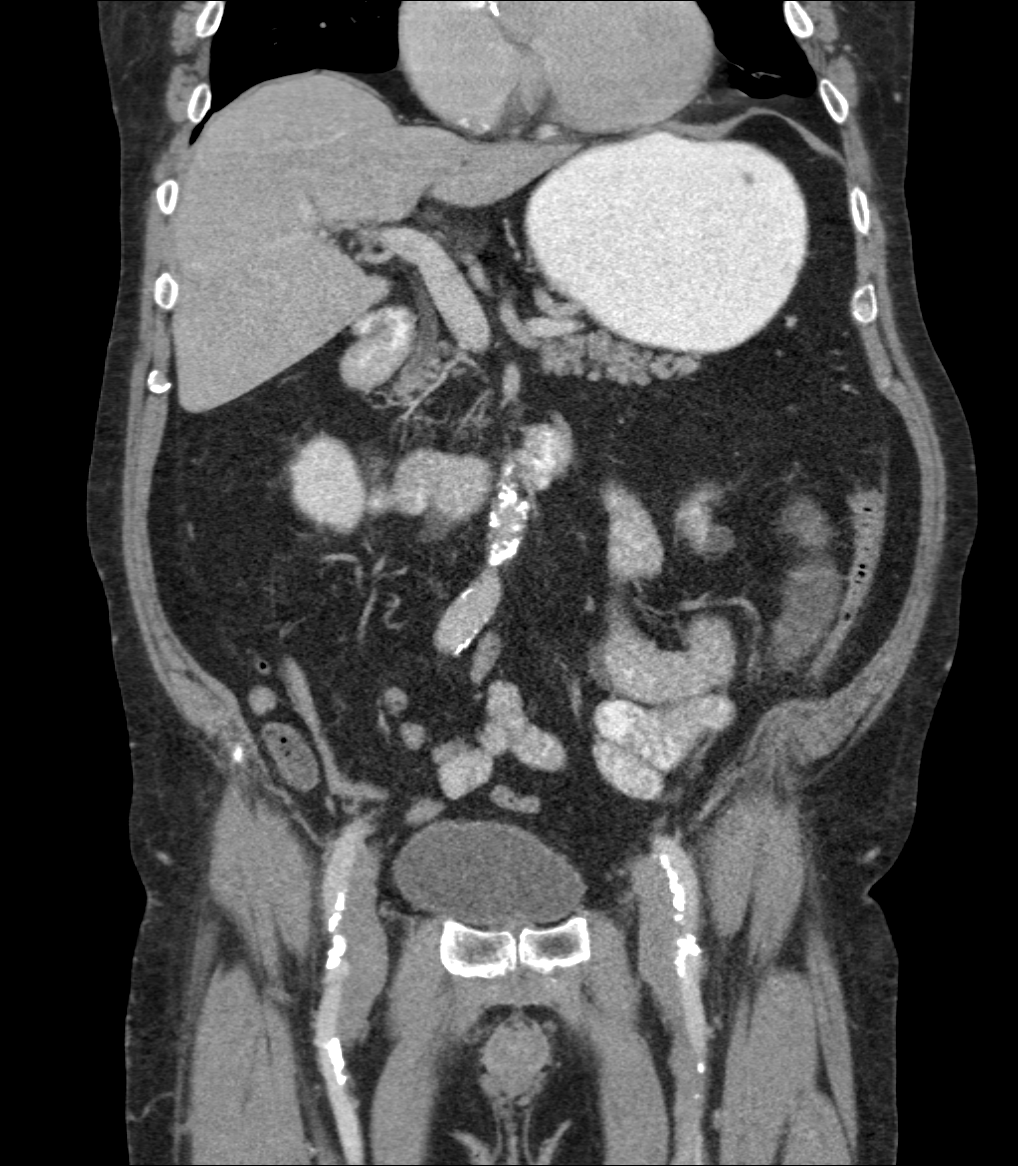
[im 73/131  soft-tissue]
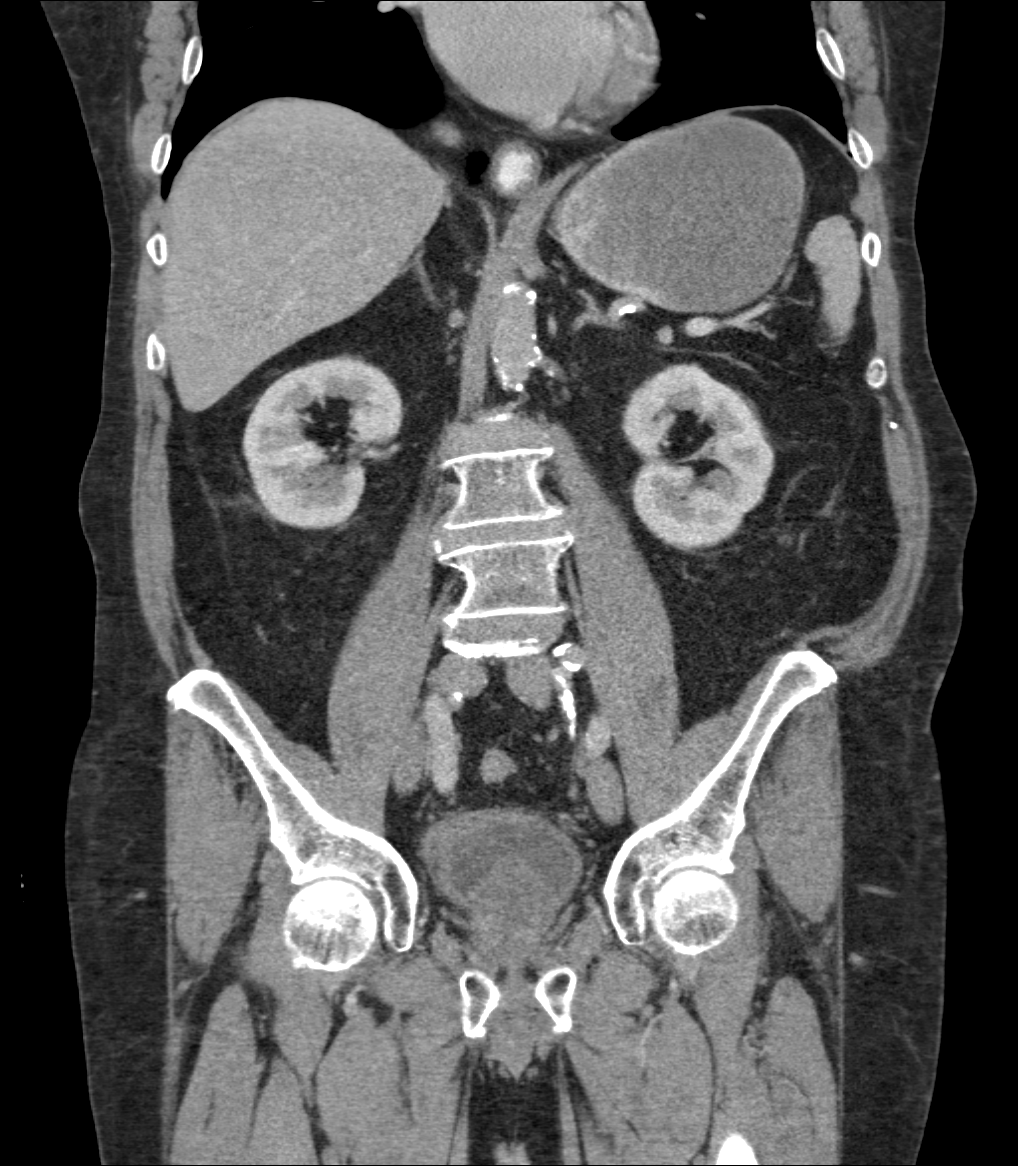

[8 of 46 positions shown; findings below may reference images not displayed]

FINDINGS: LUNG BASES: Included view of the lung bases demonstrates dependent
atelectatic this Cough the heart appears mildly enlarged with
coronary artery calcifications. No pericardial fluid collections.

SOLID ORGANS: The liver, spleen, gallbladder, pancreas and adrenal
glands are unremarkable.

GASTROINTESTINAL TRACT: Multiple loops of dilated small bowel with
inflammation though LEFT lower quadrant measure up to 4.1 cm.
Multiple transition points and, suspected superimposed possible
internal hernia. Large bowel is decompressed.

KIDNEYS/ URINARY TRACT: Kidneys are orthotopic, demonstrating
symmetric enhancement. No nephrolithiasis, hydronephrosis or solid
renal masses. Too small to characterize hypodensities in the kidneys
bilaterally. 1 cm exophytic LEFT lower pole cyst. The unopacified
ureters are normal in course and caliber. Delayed imaging through
the kidneys demonstrates symmetric prompt contrast excretion within
the proximal urinary collecting system. Urinary bladder is partially
distended and unremarkable.

PERITONEUM/RETROPERITONEUM: Aortoiliac vessels are normal in course,
mild infrarenal aortic ectasia with moderate calcific
atherosclerosis. Mildly ectatic RIGHT Common iliac artery. No
lymphadenopathy by CT size criteria. Moderate prostatomegaly. Small
amount of ascites in the LEFT abdomen.

SOFT TISSUE/OSSEOUS STRUCTURES: Non-suspicious. Extensive anterior
abdominal wall scarring. Multiple small the moderate fat containing
ventral hernias. Moderate to severe sacroiliac osteoarthrosis.
Severe lower lumbar facet arthropathy, severe L3-4, L4-5 neural
foraminal narrowing on the RIGHT.
IMPRESSION: Partial versus early bowel obstruction with multiple transition
points. This may reflect adhesions though, suspect superimposed
internal hernia. Small amount of ascites in the LEFT abdomen is
likely reactive.

Extensive abdominal wall scarring with fat containing ventral
hernias.

  By: TIGER

## 2014-03-21 MED ORDER — MORPHINE SULFATE 2 MG/ML IJ SOLN
2.0000 mg | INTRAMUSCULAR | Status: DC | PRN
  Administered 2014-03-21: 2 mg via INTRAVENOUS
  Filled 2014-03-21 (×2): qty 1

## 2014-03-21 MED ORDER — PANTOPRAZOLE SODIUM 40 MG IV SOLR
40.0000 mg | Freq: Two times a day (BID) | INTRAVENOUS | Status: DC
  Administered 2014-03-21 – 2014-03-25 (×9): 40 mg via INTRAVENOUS
  Filled 2014-03-21 (×10): qty 40

## 2014-03-21 MED ORDER — ONDANSETRON HCL 4 MG PO TABS: 4.0000 mg | ORAL_TABLET | Freq: Four times a day (QID) | ORAL | Status: DC | PRN

## 2014-03-21 MED ORDER — FENTANYL CITRATE 0.05 MG/ML IJ SOLN
50.0000 ug | Freq: Once | INTRAMUSCULAR | Status: AC
  Administered 2014-03-21: 50 ug via INTRAVENOUS
  Filled 2014-03-21: qty 2

## 2014-03-21 MED ORDER — NITROGLYCERIN 0.4 MG SL SUBL: 0.4000 mg | SUBLINGUAL_TABLET | SUBLINGUAL | Status: DC | PRN

## 2014-03-21 MED ORDER — ONDANSETRON HCL 4 MG/2ML IJ SOLN
4.0000 mg | Freq: Once | INTRAMUSCULAR | Status: AC
  Administered 2014-03-21: 4 mg via INTRAVENOUS
  Filled 2014-03-21: qty 2

## 2014-03-21 MED ORDER — HEPARIN (PORCINE) IN NACL 100-0.45 UNIT/ML-% IJ SOLN
1450.0000 [IU]/h | INTRAMUSCULAR | Status: DC
  Administered 2014-03-21 – 2014-03-24 (×4): 1450 [IU]/h via INTRAVENOUS
  Filled 2014-03-21 (×7): qty 250

## 2014-03-21 MED ORDER — DEXTROSE-NACL 5-0.45 % IV SOLN
INTRAVENOUS | Status: AC
  Administered 2014-03-21 (×2): via INTRAVENOUS

## 2014-03-21 MED ORDER — HYDROMORPHONE HCL 1 MG/ML IJ SOLN
0.5000 mg | Freq: Once | INTRAMUSCULAR | Status: AC
  Administered 2014-03-21: 0.5 mg via INTRAVENOUS

## 2014-03-21 MED ORDER — FENTANYL CITRATE 0.05 MG/ML IJ SOLN
50.0000 ug | INTRAMUSCULAR | Status: DC | PRN
  Administered 2014-03-21: 50 ug via INTRAVENOUS
  Filled 2014-03-21: qty 2

## 2014-03-21 MED ORDER — IOHEXOL 300 MG/ML  SOLN
100.0000 mL | Freq: Once | INTRAMUSCULAR | Status: AC | PRN
  Administered 2014-03-21: 100 mL via INTRAVENOUS

## 2014-03-21 MED ORDER — MORPHINE SULFATE 2 MG/ML IJ SOLN
2.0000 mg | INTRAMUSCULAR | Status: DC | PRN
  Administered 2014-03-21 (×4): 2 mg via INTRAVENOUS
  Filled 2014-03-21 (×4): qty 1

## 2014-03-21 MED ORDER — IOHEXOL 300 MG/ML  SOLN
25.0000 mL | Freq: Once | INTRAMUSCULAR | Status: AC | PRN
  Administered 2014-03-21: 25 mL via ORAL

## 2014-03-21 MED ORDER — NITROGLYCERIN 2 % TD OINT
0.5000 [in_us] | TOPICAL_OINTMENT | Freq: Four times a day (QID) | TRANSDERMAL | Status: DC
  Filled 2014-03-21: qty 30

## 2014-03-21 MED ORDER — SODIUM CHLORIDE 0.9 % IV BOLUS (SEPSIS)
1000.0000 mL | Freq: Once | INTRAVENOUS | Status: AC
  Administered 2014-03-21: 1000 mL via INTRAVENOUS

## 2014-03-21 MED ORDER — HYDRALAZINE HCL 20 MG/ML IJ SOLN
10.0000 mg | Freq: Four times a day (QID) | INTRAMUSCULAR | Status: DC | PRN
  Administered 2014-03-21 – 2014-03-25 (×3): 10 mg via INTRAVENOUS
  Filled 2014-03-21 (×3): qty 1

## 2014-03-21 MED ORDER — ONDANSETRON HCL 4 MG/2ML IJ SOLN
4.0000 mg | Freq: Four times a day (QID) | INTRAMUSCULAR | Status: DC | PRN
  Administered 2014-03-21 – 2014-03-22 (×2): 4 mg via INTRAVENOUS
  Filled 2014-03-21 (×2): qty 2

## 2014-03-21 MED ORDER — GUAIFENESIN-DM 100-10 MG/5ML PO SYRP
5.0000 mL | ORAL_SOLUTION | ORAL | Status: DC | PRN
  Filled 2014-03-21: qty 5

## 2014-03-21 MED ORDER — HYDROMORPHONE HCL 1 MG/ML IJ SOLN: 2.0000 mg | Freq: Once | INTRAMUSCULAR | Status: DC

## 2014-03-21 NOTE — ED Notes (Signed)
Pt. reports mid abdominal pain with constipation onset this evening , denies nausea or vomitting .

## 2014-03-21 NOTE — ED Notes (Signed)
Patient transported to CT 

## 2014-03-21 NOTE — Evaluation (Signed)
EDPA: Humes  Reason for Admission: SBO   Abd pain, constipation x 1 day. CT abd and pelvis in ER + Partial versus early bowel obstruction with multiple transition points. This may reflect adhesions though, suspect superimposed internal hernia. Surgery consulting. Asking for medical admission given comorbidities including CAD, hx/o DVT on coumadin. Hemodynamically stable. Bloodwork WNL. INR pending.

## 2014-03-21 NOTE — Consult Note (Signed)
Reason for Consult:PSBO  Referring Physician: Julianne Rice  William Norman is an 74 y.o. male.  HPI: William Norman developed acute onset of mid abdominal pain around 10 PM last night. He ate a sandwich but the pain persisted. It is been crampy. It comes and goes. The pain increased in intensity and prevented him from sleeping. He says he usually passes a lot of gas but has not passed any since this episode began. He came to the emergency department for evaluation. Laboratory studies were unrevealing. CT scan of the abdomen and pelvis revealed partial small bowel obstruction. I was asked to see him in consultation for surgical management. He did not have associated nausea or vomiting at home. However, he has developed nausea recently in the emergency department. He is known to Dr. Dalbert Batman from our practice status post colectomy with colostomy in 2010 for what sounds like perforated diverticulitis and then colostomy takedown in 2011.  Past Medical History  Diagnosis Date  . CAD (coronary artery disease)   . Hyperlipidemia   . Hypertension   . BPH (benign prostatic hypertrophy)   . GERD (gastroesophageal reflux disease)   . Eczema   . DVT (deep venous thrombosis)     Past Surgical History  Procedure Laterality Date  . Precutaneous transluminal coronary angioplasty    . Tonsillectomy and adenoidectomy    . Right hernia    . Colon surgery  2010 and 2011 colostomy reversal  . Carpal tunnel release  04/21/2011    Procedure: CARPAL TUNNEL RELEASE;  Surgeon: Tennis Must, MD;  Location: [facility];  Service: Orthopedics;  Laterality: Left;  . Carpal tunnel release  05/23/2011    Procedure: CARPAL TUNNEL RELEASE;  Surgeon: Tennis Must, MD;  Location: [facility];  Service: Orthopedics;  Laterality: Right;    Family History  Problem Relation Age of Onset  . Heart disease Father     Social History:  reports that he quit smoking about 16 years ago. He does not have  any smokeless tobacco history on file. He reports that he drinks about 0.6 oz of alcohol per week. He reports that he does not use illicit drugs.  Allergies:  Allergies  Allergen Reactions  . Other Other (See Comments)    Some form of numbing medication used by dentist, almost died    Medications: Prior to Admission:  (Not in a hospital admission)  Results for orders placed or performed during the hospital encounter of 03/21/14 (from the past 48 hour(s))  CBC with Differential     Status: None   Collection Time: 03/21/14  2:13 AM  Result Value Ref Range   WBC 5.7 4.0 - 10.5 K/uL   RBC 5.21 4.22 - 5.81 MIL/uL   Hemoglobin 15.3 13.0 - 17.0 g/dL   HCT 46.2 39.0 - 52.0 %   MCV 88.7 78.0 - 100.0 fL   MCH 29.4 26.0 - 34.0 pg   MCHC 33.1 30.0 - 36.0 g/dL   RDW 14.7 11.5 - 15.5 %   Platelets 194 150 - 400 K/uL   Neutrophils Relative % 43 43 - 77 %   Neutro Abs 2.4 1.7 - 7.7 K/uL   Lymphocytes Relative 43 12 - 46 %   Lymphs Abs 2.5 0.7 - 4.0 K/uL   Monocytes Relative 11 3 - 12 %   Monocytes Absolute 0.6 0.1 - 1.0 K/uL   Eosinophils Relative 2 0 - 5 %   Eosinophils Absolute 0.1 0.0 - 0.7 K/uL  Basophils Relative 1 0 - 1 %   Basophils Absolute 0.1 0.0 - 0.1 K/uL  Comprehensive metabolic panel     Status: Abnormal   Collection Time: 03/21/14  2:13 AM  Result Value Ref Range   Sodium 141 135 - 145 mmol/L   Potassium 4.1 3.5 - 5.1 mmol/L   Chloride 106 96 - 112 mmol/L   CO2 30 19 - 32 mmol/L   Glucose, Bld 109 (H) 70 - 99 mg/dL   BUN 12 6 - 23 mg/dL   Creatinine, Ser 0.94 0.50 - 1.35 mg/dL   Calcium 10.1 8.4 - 10.5 mg/dL   Total Protein 6.8 6.0 - 8.3 g/dL   Albumin 4.1 3.5 - 5.2 g/dL   AST 42 (H) 0 - 37 U/L   ALT 29 0 - 53 U/L   Alkaline Phosphatase 49 39 - 117 U/L   Total Bilirubin 0.7 0.3 - 1.2 mg/dL   GFR calc non Af Amer 80 (L) >90 mL/min   GFR calc Af Amer >90 >90 mL/min    Comment: (NOTE) The eGFR has been calculated using the CKD EPI equation. This calculation has  not been validated in all clinical situations. eGFR's persistently <90 mL/min signify possible Chronic Kidney Disease.    Anion gap 5 5 - 15  Lipase, blood     Status: None   Collection Time: 03/21/14  2:13 AM  Result Value Ref Range   Lipase 26 11 - 59 U/L  I-Stat CG4 Lactic Acid, ED     Status: None   Collection Time: 03/21/14  2:21 AM  Result Value Ref Range   Lactic Acid, Venous 1.03 0.5 - 2.0 mmol/L  Urinalysis, Routine w reflex microscopic     Status: Abnormal   Collection Time: 03/21/14  4:20 AM  Result Value Ref Range   Color, Urine YELLOW YELLOW   APPearance CLEAR CLEAR   Specific Gravity, Urine 1.017 1.005 - 1.030   pH 5.0 5.0 - 8.0   Glucose, UA NEGATIVE NEGATIVE mg/dL   Hgb urine dipstick SMALL (A) NEGATIVE   Bilirubin Urine NEGATIVE NEGATIVE   Ketones, ur NEGATIVE NEGATIVE mg/dL   Protein, ur NEGATIVE NEGATIVE mg/dL   Urobilinogen, UA 0.2 0.0 - 1.0 mg/dL   Nitrite NEGATIVE NEGATIVE   Leukocytes, UA SMALL (A) NEGATIVE  Urine microscopic-add on     Status: None   Collection Time: 03/21/14  4:20 AM  Result Value Ref Range   Squamous Epithelial / LPF RARE RARE   WBC, UA 11-20 <3 WBC/hpf   RBC / HPF 0-2 <3 RBC/hpf   Bacteria, UA RARE RARE    Ct Abdomen Pelvis W Contrast  03/21/2014   CLINICAL DATA:  Mid abdominal pain beginning last night, worsening since arrival in emergency department. History of constipation. History of hernia repair and reflux disease.  EXAM: CT ABDOMEN AND PELVIS WITH CONTRAST  TECHNIQUE: Multidetector CT imaging of the abdomen and pelvis was performed using the standard protocol following bolus administration of intravenous contrast.  CONTRAST:  189m OMNIPAQUE IOHEXOL 300 MG/ML  SOLN  COMPARISON:  CT of the abdomen and pelvis May 21, 2009  FINDINGS: LUNG BASES: Included view of the lung bases demonstrates dependent atelectatic this Cough the heart appears mildly enlarged with coronary artery calcifications. No pericardial fluid collections.  SOLID  ORGANS: The liver, spleen, gallbladder, pancreas and adrenal glands are unremarkable.  GASTROINTESTINAL TRACT: Multiple loops of dilated small bowel with inflammation though LEFT lower quadrant measure up to 4.1 cm. Multiple transition  points and, suspected superimposed possible internal hernia. Large bowel is decompressed.  KIDNEYS/ URINARY TRACT: Kidneys are orthotopic, demonstrating symmetric enhancement. No nephrolithiasis, hydronephrosis or solid renal masses. Too small to characterize hypodensities in the kidneys bilaterally. 1 cm exophytic LEFT lower pole cyst. The unopacified ureters are normal in course and caliber. Delayed imaging through the kidneys demonstrates symmetric prompt contrast excretion within the proximal urinary collecting system. Urinary bladder is partially distended and unremarkable.  PERITONEUM/RETROPERITONEUM: Aortoiliac vessels are normal in course, mild infrarenal aortic ectasia with moderate calcific atherosclerosis. Mildly ectatic RIGHT Common iliac artery. No lymphadenopathy by CT size criteria. Moderate prostatomegaly. Small amount of ascites in the LEFT abdomen.  SOFT TISSUE/OSSEOUS STRUCTURES: Non-suspicious. Extensive anterior abdominal wall scarring. Multiple small the moderate fat containing ventral hernias. Moderate to severe sacroiliac osteoarthrosis. Severe lower lumbar facet arthropathy, severe L3-4, L4-5 neural foraminal narrowing on the RIGHT.  IMPRESSION: Partial versus early bowel obstruction with multiple transition points. This may reflect adhesions though, suspect superimposed internal hernia. Small amount of ascites in the LEFT abdomen is likely reactive.  Extensive abdominal wall scarring with fat containing ventral hernias.   Electronically Signed   By: Elon Alas   On: 03/21/2014 05:17    Review of Systems  Constitutional: Negative for fever and chills.  HENT: Negative.   Eyes: Negative.   Respiratory: Negative.   Cardiovascular: Negative for  chest pain and palpitations.  Gastrointestinal: Positive for nausea, abdominal pain and constipation. Negative for vomiting and diarrhea.  Genitourinary: Negative.   Musculoskeletal: Negative.   Skin: Negative.   Neurological: Negative.   Endo/Heme/Allergies: Negative.   Psychiatric/Behavioral: Negative.    Blood pressure 161/79, pulse 65, temperature 97.9 F (36.6 C), temperature source Oral, resp. rate 19, height 6' (1.829 m), weight 230 lb (104.327 kg), SpO2 89 %. Physical Exam  Constitutional: He is oriented to person, place, and time. He appears well-developed and well-nourished. No distress.  HENT:  Head: Normocephalic and atraumatic.  Right Ear: External ear normal.  Left Ear: External ear normal.  Nose: Nose normal.  Mouth/Throat: Oropharynx is clear and moist.  Eyes: EOM are normal. Pupils are equal, round, and reactive to light. Right eye exhibits no discharge. Left eye exhibits no discharge. No scleral icterus.  Neck: Neck supple. No tracheal deviation present.  Cardiovascular: Normal rate, regular rhythm, normal heart sounds and intact distal pulses.   Moderate peripheral edema  Respiratory: Effort normal and breath sounds normal. No stridor. No respiratory distress. He has no wheezes. He has no rales.  GI: Soft. He exhibits distension. There is no tenderness. There is no rebound and no guarding.  Small reducible incisional hernias along midline  Musculoskeletal: Normal range of motion.  Neurological: He is alert and oriented to person, place, and time. He exhibits normal muscle tone.  Skin: Skin is warm and dry.  Psychiatric: He has a normal mood and affect.    Assessment/Plan: PSBO - likely due to adhesions. Agree with medical admission. Recommend bowel rest and IV fluids. If he develops significant nausea and vomiting would place nasogastric tube to low intermittent suction. We will follow along closely with you. If conservative measures fail, he may require  laparotomy. I discussed this plan of care with him and answered his questions.  Layney Gillson E 03/21/2014, 6:16 AM

## 2014-03-21 NOTE — Progress Notes (Signed)
ANTICOAGULATION CONSULT NOTE - Follow-up Consult  Pharmacy Consult for heparin Indication: h/o DVT  Allergies  Allergen Reactions  . Other Other (See Comments)    Some form of numbing medication used by dentist, almost died    Patient Measurements: Height: 6' (182.9 cm) Weight: 230 lb (104.327 kg) IBW/kg (Calculated) : 77.6  Heparin dosing wt: 106 kg   Vital Signs: Temp: 98.5 F (36.9 C) (02/05 2057) Temp Source: Oral (02/05 2057) BP: 153/74 mmHg (02/05 2057) Pulse Rate: 73 (02/05 2057)  Labs:  Recent Labs  03/21/14 0213 03/21/14 0543 03/21/14 1956  HGB 15.3  --   --   HCT 46.2  --   --   PLT 194  --   --   APTT  --  38*  --   LABPROT  --  25.0* 22.1*  INR  --  2.24* 1.92*  CREATININE 0.94  --   --     Estimated Creatinine Clearance: 86.1 mL/min (by C-G formula based on Cr of 0.94).   Assessment: 74 yo man admitted with abdominal pain.  He was on coumadin for h/o DVT.  His last dose was 2/4. INR down to 1.92. Will start heparin now that INR <2.0.  Goal of Therapy:  Heparin level 0.3-0.7 units/ml Monitor platelets by anticoagulation protocol: Yes   Plan:  1. Heparin gtt at 1450 units/hr. No bolus 2. Will f/u 8hr heparin level 3. Daily heparin level and CBC  Thanks for allowing pharmacy to be a part of this patient's care.  Sherlon Handing, PharmD, BCPS Clinical pharmacist, pager [phone] 03/21/2014,9:07 PM

## 2014-03-21 NOTE — ED Provider Notes (Signed)
CSN: [id number]     Arrival date & time 03/21/14  0147 History   First MD Initiated Contact with Patient 03/21/14 [phone]     Chief Complaint  Patient presents with  . Abdominal Pain  . Constipation    (Consider location/radiation/quality/duration/timing/severity/associated sxs/prior Treatment) HPI Comments: 74 year old male with a history of CAD, hyperlipidemia, hypertension, BPH, esophageal reflux, and DVT presents to the emergency department today for further evaluation of abdominal pain. Patient states that his abdominal pain began this evening. It has been waxing and waning in severity as well as intermittent. Patient denies any modifying factors of his symptoms. He feels as though his pain is primarily present in his epigastric region. No radiation of the pain, per patient. He states that he feels as though he would have improvement in his pain if he had a bowel movement, but has been unable to do so this evening. He denies passing any flatus. He does report 2 normal bowel movements prior to onset of symptoms, yesterday. He states that he feels as though his abdomen is becoming more distended.   Per patient, he has a history of colonic perforation. As a result, patient had a sigmoid colectomy with colostomy and, later, reversal by Dr. Dalbert Batman. He denies any other history of abdominal surgeries. Patient denies associated fever, chest pain, shortness of breath, nausea, vomiting, diarrhea, melena or hematochezia, urinary symptoms, and syncope or near-syncope.  PCP - Stevie Kern Cardiologist - [facility]  The history is provided by the patient. No language interpreter was used.    Past Medical History  Diagnosis Date  . CAD (coronary artery disease)   . Hyperlipidemia   . Hypertension   . BPH (benign prostatic hypertrophy)   . GERD (gastroesophageal reflux disease)   . Eczema   . DVT (deep venous thrombosis)    Past Surgical History  Procedure Laterality Date  . Precutaneous  transluminal coronary angioplasty    . Tonsillectomy and adenoidectomy    . Right hernia    . Colon surgery  2010 and 2011 colostomy reversal  . Carpal tunnel release  04/21/2011    Procedure: CARPAL TUNNEL RELEASE;  Surgeon: Tennis Must, MD;  Location: [place];  Service: Orthopedics;  Laterality: Left;  . Carpal tunnel release  05/23/2011    Procedure: CARPAL TUNNEL RELEASE;  Surgeon: Tennis Must, MD;  Location: [place];  Service: Orthopedics;  Laterality: Right;   Family History  Problem Relation Age of Onset  . Heart disease Father    History  Substance Use Topics  . Smoking status: Former Smoker    Quit date: 03/25/1998  . Smokeless tobacco: Not on file  . Alcohol Use: 0.6 oz/week    1 Cans of beer per week    Review of Systems  Constitutional: Negative for fever.  Respiratory: Negative for shortness of breath.   Cardiovascular: Negative for chest pain.  Gastrointestinal: Positive for abdominal pain and abdominal distention. Negative for nausea, vomiting and diarrhea.  Genitourinary: Negative for dysuria and hematuria.  Neurological: Negative for syncope.  All other systems reviewed and are negative.   Allergies  Other  Home Medications   Prior to Admission medications   Medication Sig Start Date End Date Taking? Authorizing Provider  atenolol (TENORMIN) 100 MG tablet TAKE 1 TABLET BY MOUTH EVERY DAY 07/15/13  Yes Dorena Cookey, MD  atorvastatin (LIPITOR) 40 MG tablet Take 1 tablet (40 mg total) by mouth daily. 07/18/13  Yes Dellis Filbert  Delora Fuel, MD  esomeprazole (NEXIUM) 40 MG capsule Take 1 capsule (40 mg total) by mouth daily before breakfast. Patient taking differently: Take 40 mg by mouth daily as needed (acid reflux).  07/15/13 07/15/14 Yes Dorena Cookey, MD  hydroxypropyl methylcellulose / hypromellose (ISOPTO TEARS / GONIOVISC) 2.5 % ophthalmic solution Place 1 drop into both eyes 4 (four) times daily as needed for dry eyes.   Yes  Historical Provider, MD  lisinopril (PRINIVIL,ZESTRIL) 40 MG tablet Take 1 tablet (40 mg total) by mouth daily. 02/24/14  Yes Dorena Cookey, MD  niacin 50 MG tablet Take 50 mg by mouth daily.   Yes Historical Provider, MD  nitroGLYCERIN (NITROSTAT) 0.4 MG SL tablet Place 1 tablet (0.4 mg total) under the tongue every 5 (five) minutes as needed. 07/15/13  Yes Dorena Cookey, MD  OVER THE COUNTER MEDICATION Take 1 capsule by mouth daily.    Yes Historical Provider, MD  oxymetazoline (AFRIN) 0.05 % nasal spray Place 2 sprays into both nostrils every evening.   Yes Historical Provider, MD  sildenafil (VIAGRA) 100 MG tablet Take 1 tablet (100 mg total) by mouth daily as needed. Patient taking differently: Take 100 mg by mouth daily as needed for erectile dysfunction.  07/15/13  Yes Dorena Cookey, MD  tamsulosin (FLOMAX) 0.4 MG CAPS capsule Take 0.4 mg by mouth daily.   Yes Historical Provider, MD  warfarin (COUMADIN) 5 MG tablet Take as directed by anticoagulation clinic Patient taking differently: Take 5 mg by mouth daily.  07/15/13  Yes Dorena Cookey, MD  doxazosin (CARDURA) 8 MG tablet Take 1 tablet (8 mg total) by mouth daily. Patient not taking: Reported on 03/21/2014 09/09/13   Dorena Cookey, MD  hydrochlorothiazide (HYDRODIURIL) 50 MG tablet Take 50 mg by mouth daily.    Historical Provider, MD   BP 161/77 mmHg  Pulse 62  Temp(Src) 97.9 F (36.6 C) (Oral)  Resp 14  Ht 6' (1.829 m)  Wt 230 lb (104.327 kg)  BMI 31.19 kg/m2  SpO2 90%   Physical Exam  Constitutional: He is oriented to person, place, and time. He appears well-developed and well-nourished. No distress.  Nontoxic/nonseptic appearing. Patient is calm and pleasant.  HENT:  Head: Normocephalic and atraumatic.  Eyes: Conjunctivae and EOM are normal. No scleral icterus.  Neck: Normal range of motion.  Cardiovascular: Normal rate, regular rhythm and intact distal pulses.   Pulmonary/Chest: Effort normal and breath sounds normal. No  respiratory distress. He has no wheezes. He has no rales.  Respirations even and unlabored  Abdominal: Soft. He exhibits distension (mild). There is tenderness in the right lower quadrant, epigastric area and left upper quadrant. There is no rebound and no guarding.    TTP in the epigastric regio, LUQ, and RLQ. No masses or peritoneal signs. Abdomen appears mildly distended; not firm.  Musculoskeletal: Normal range of motion.  Neurological: He is alert and oriented to person, place, and time. He exhibits normal muscle tone. Coordination normal.  Skin: Skin is warm and dry. No rash noted. He is not diaphoretic. No erythema. No pallor.  Psychiatric: He has a normal mood and affect. His behavior is normal.  Nursing note and vitals reviewed.   ED Course  Procedures (including critical care time) Labs Review Labs Reviewed  COMPREHENSIVE METABOLIC PANEL - Abnormal; Notable for the following:    Glucose, Bld 109 (*)    AST 42 (*)    GFR calc non Af Amer 80 (*)  All other components within normal limits  URINALYSIS, ROUTINE W REFLEX MICROSCOPIC - Abnormal; Notable for the following:    Hgb urine dipstick SMALL (*)    Leukocytes, UA SMALL (*)    All other components within normal limits  CBC WITH DIFFERENTIAL/PLATELET  LIPASE, BLOOD  URINE MICROSCOPIC-ADD ON  I-STAT CG4 LACTIC ACID, ED    Imaging Review Ct Abdomen Pelvis W Contrast  03/21/2014   CLINICAL DATA:  Mid abdominal pain beginning last night, worsening since arrival in emergency department. History of constipation. History of hernia repair and reflux disease.  EXAM: CT ABDOMEN AND PELVIS WITH CONTRAST  TECHNIQUE: Multidetector CT imaging of the abdomen and pelvis was performed using the standard protocol following bolus administration of intravenous contrast.  CONTRAST:  162mL OMNIPAQUE IOHEXOL 300 MG/ML  SOLN  COMPARISON:  CT of the abdomen and pelvis May 21, 2009  FINDINGS: LUNG BASES: Included view of the lung bases  demonstrates dependent atelectatic this Cough the heart appears mildly enlarged with coronary artery calcifications. No pericardial fluid collections.  SOLID ORGANS: The liver, spleen, gallbladder, pancreas and adrenal glands are unremarkable.  GASTROINTESTINAL TRACT: Multiple loops of dilated small bowel with inflammation though LEFT lower quadrant measure up to 4.1 cm. Multiple transition points and, suspected superimposed possible internal hernia. Large bowel is decompressed.  KIDNEYS/ URINARY TRACT: Kidneys are orthotopic, demonstrating symmetric enhancement. No nephrolithiasis, hydronephrosis or solid renal masses. Too small to characterize hypodensities in the kidneys bilaterally. 1 cm exophytic LEFT lower pole cyst. The unopacified ureters are normal in course and caliber. Delayed imaging through the kidneys demonstrates symmetric prompt contrast excretion within the proximal urinary collecting system. Urinary bladder is partially distended and unremarkable.  PERITONEUM/RETROPERITONEUM: Aortoiliac vessels are normal in course, mild infrarenal aortic ectasia with moderate calcific atherosclerosis. Mildly ectatic RIGHT Common iliac artery. No lymphadenopathy by CT size criteria. Moderate prostatomegaly. Small amount of ascites in the LEFT abdomen.  SOFT TISSUE/OSSEOUS STRUCTURES: Non-suspicious. Extensive anterior abdominal wall scarring. Multiple small the moderate fat containing ventral hernias. Moderate to severe sacroiliac osteoarthrosis. Severe lower lumbar facet arthropathy, severe L3-4, L4-5 neural foraminal narrowing on the RIGHT.  IMPRESSION: Partial versus early bowel obstruction with multiple transition points. This may reflect adhesions though, suspect superimposed internal hernia. Small amount of ascites in the LEFT abdomen is likely reactive.  Extensive abdominal wall scarring with fat containing ventral hernias.   Electronically Signed   By: Elon Alas   On: 03/21/2014 05:17     EKG  Interpretation None      MDM   Final diagnoses:  Abdominal pain, unspecified abdominal location    74 year old male with a history of colectomy with colostomy and reversal by Dr. Dalbert Batman presents to the emergency department for further evaluation of abdominal pain which began yesterday evening. No associated nausea, vomiting, or diarrhea. Patient hemodynamically stable and afebrile. His labs today are unremarkable. CT scan ordered for further evaluation which shows partial versus early bowel obstruction with multiple transition points. Radiologist referencing possible adhesions versus superimposed internal hernia.  Case discussed with Dr. Grandville Silos of [facility]. He will come and evaluate the patient in ED for admission. Advises to hold NG tube until surgical evaluation. Dr. Grandville Silos does recommend admission to the hospitalist service given his medical history and comorbidities. Case discussed with Dr. Ernestina Patches of [facility] to admit. Temp admit orders placed.   Filed Vitals:   03/21/14 0345 03/21/14 0400 03/21/14 0415 03/21/14 0430  BP: 161/79 173/88 177/85 161/77  Pulse: 60 64  61 62  Temp:      TempSrc:      Resp:      Height:      Weight:      SpO2: 94% 98% 94% 90%     Antonietta Breach, PA-C 03/21/14 0602  Julianne Rice, MD 03/21/14 [phone]

## 2014-03-21 NOTE — Progress Notes (Signed)
Utilization review completed. Jamita Mckelvin, RN, BSN. 

## 2014-03-21 NOTE — Progress Notes (Signed)
[facility] Progress Note     Subjective: Pt feels bad, still with pain.  Not much nausea, no vomiting.  Hasn't been OOB yet due to pain  Objective: Vital signs in last 24 hours: Temp:  [97.9 F (36.6 C)-98.3 F (36.8 C)] 98.3 F (36.8 C) (02/05 0717) Pulse Rate:  [59-65] 64 (02/05 0717) Resp:  [12-19] 12 (02/05 0717) BP: (151-179)/(73-98) 157/78 mmHg (02/05 0717) SpO2:  [89 %-98 %] 94 % (02/05 0717) Weight:  [230 lb (104.327 kg)] 230 lb (104.327 kg) (02/05 0157)    Intake/Output from previous day:   Intake/Output this shift:    PE: Gen:  Alert, NAD, pleasant Abd: Soft, distended, tender in the upper abdomen, +BS, no HSM, no abdominal scars noted   Lab Results:   Recent Labs  03/21/14 0213  WBC 5.7  HGB 15.3  HCT 46.2  PLT 194   BMET  Recent Labs  03/21/14 0213  NA 141  K 4.1  CL 106  CO2 30  GLUCOSE 109*  BUN 12  CREATININE 0.94  CALCIUM 10.1   PT/INR  Recent Labs  03/21/14 0543  LABPROT 25.0*  INR 2.24*   CMP     Component Value Date/Time   NA 141 03/21/2014 0213   K 4.1 03/21/2014 0213   CL 106 03/21/2014 0213   CO2 30 03/21/2014 0213   GLUCOSE 109* 03/21/2014 0213   GLUCOSE 94 01/10/2006 1108   BUN 12 03/21/2014 0213   CREATININE 0.94 03/21/2014 0213   CALCIUM 10.1 03/21/2014 0213   PROT 6.8 03/21/2014 0213   ALBUMIN 4.1 03/21/2014 0213   AST 42* 03/21/2014 0213   ALT 29 03/21/2014 0213   ALKPHOS 49 03/21/2014 0213   BILITOT 0.7 03/21/2014 0213   GFRNONAA 80* 03/21/2014 0213   GFRAA >90 03/21/2014 0213   Lipase     Component Value Date/Time   LIPASE 26 03/21/2014 0213       Studies/Results: Ct Abdomen Pelvis W Contrast  03/21/2014   CLINICAL DATA:  Mid abdominal pain beginning last night, worsening since arrival in emergency department. History of constipation. History of hernia repair and reflux disease.  EXAM: CT ABDOMEN AND PELVIS WITH CONTRAST  TECHNIQUE: Multidetector CT imaging of the abdomen and  pelvis was performed using the standard protocol following bolus administration of intravenous contrast.  CONTRAST:  144mL OMNIPAQUE IOHEXOL 300 MG/ML  SOLN  COMPARISON:  CT of the abdomen and pelvis May 21, 2009  FINDINGS: LUNG BASES: Included view of the lung bases demonstrates dependent atelectatic this Cough the heart appears mildly enlarged with coronary artery calcifications. No pericardial fluid collections.  SOLID ORGANS: The liver, spleen, gallbladder, pancreas and adrenal glands are unremarkable.  GASTROINTESTINAL TRACT: Multiple loops of dilated small bowel with inflammation though LEFT lower quadrant measure up to 4.1 cm. Multiple transition points and, suspected superimposed possible internal hernia. Large bowel is decompressed.  KIDNEYS/ URINARY TRACT: Kidneys are orthotopic, demonstrating symmetric enhancement. No nephrolithiasis, hydronephrosis or solid renal masses. Too small to characterize hypodensities in the kidneys bilaterally. 1 cm exophytic LEFT lower pole cyst. The unopacified ureters are normal in course and caliber. Delayed imaging through the kidneys demonstrates symmetric prompt contrast excretion within the proximal urinary collecting system. Urinary bladder is partially distended and unremarkable.  PERITONEUM/RETROPERITONEUM: Aortoiliac vessels are normal in course, mild infrarenal aortic ectasia with moderate calcific atherosclerosis. Mildly ectatic RIGHT Common iliac artery. No lymphadenopathy by CT size criteria. Moderate prostatomegaly. Small amount of ascites in the LEFT abdomen.  SOFT TISSUE/OSSEOUS STRUCTURES: Non-suspicious. Extensive anterior abdominal wall scarring. Multiple small the moderate fat containing ventral hernias. Moderate to severe sacroiliac osteoarthrosis. Severe lower lumbar facet arthropathy, severe L3-4, L4-5 neural foraminal narrowing on the RIGHT.  IMPRESSION: Partial versus early bowel obstruction with multiple transition points. This may reflect  adhesions though, suspect superimposed internal hernia. Small amount of ascites in the LEFT abdomen is likely reactive.  Extensive abdominal wall scarring with fat containing ventral hernias.   Electronically Signed   By: Elon Alas   On: 03/21/2014 05:17    Anti-infectives: Anti-infectives    None       Assessment/Plan pSBO  1.  Seen early this am by Dr. Grandville Silos.  Continue present management, await improvement in bowel function, if not improvement in 48-72hrs may need surgical intervention  2.  Low threshold for NG tube    LOS: 0 days    Coralie Keens 03/21/2014, 12:56 PM Pager: [phone]

## 2014-03-21 NOTE — ED Notes (Signed)
Hospitalist at the bedside 

## 2014-03-21 NOTE — H&P (Signed)
Patient Demographics  William Norman, is a 74 y.o. male  MRN: [id number]   DOB - [birth year]  Admit Date - 03/21/2014  Outpatient Primary MD for the patient is TODD,JEFFREY Zenia Resides, MD   With History of -  Past Medical History  Diagnosis Date  . CAD (coronary artery disease)   . Hyperlipidemia   . Hypertension   . BPH (benign prostatic hypertrophy)   . GERD (gastroesophageal reflux disease)   . Eczema   . DVT (deep venous thrombosis)       Past Surgical History  Procedure Laterality Date  . Precutaneous transluminal coronary angioplasty    . Tonsillectomy and adenoidectomy    . Right hernia    . Colon surgery  2010 and 2011 colostomy reversal  . Carpal tunnel release  04/21/2011    Procedure: CARPAL TUNNEL RELEASE;  Surgeon: Tennis Must, MD;  Location: [facility];  Service: Orthopedics;  Laterality: Left;  . Carpal tunnel release  05/23/2011    Procedure: CARPAL TUNNEL RELEASE;  Surgeon: Tennis Must, MD;  Location: [facility];  Service: Orthopedics;  Laterality: Right;    in for   Chief Complaint  Patient presents with  . Abdominal Pain  . Constipation     HPI  William Norman  is a 74 y.o. male, with history of CAD status post angioplasty over 15 years ago, DVT on Coumadin, GERD, BPH, status post colon rupture requiring surgery several years ago, BPH, essential hypertension comes to the hospital with 1 day history of constipation, generalized nonradiating dull and constant abdominal pain and bloating, associated with nausea, no aggravating or relieving symptoms.  In the ER blood work, EKG unremarkable, CT scan of the abdomen and pelvis a digestive of partial small bowel obstruction. He was seen by general surgery and hospitalist team was requested to admit. Patient  currently is feeling fairly well except for mild generalized abdominal pain and some nausea without emesis, denies any unintentional weight loss, denies any personal or family history of colon cancer or stomach cancers.    Review of Systems    In addition to the HPI above,  No Fever-chills, No Headache, No changes with Vision or hearing, No problems swallowing food or Liquids, No Chest pain, Cough or Shortness of Breapositive generalized abdominal pain with nausea but no emesis, not passing flatus.  No Blood in stool or Urine, No dysuria, No new skin rashes or bruises, No new joints pains-aches,  No new weakness, tingling, numbness in any extremity, No recent weight gain or loss, No polyuria, polydypsia or polyphagia, No significant Mental Stressors.  A full 10 point Review of Systems was done, except as stated above, all other Review of Systems were negative.   Social History History  Substance Use Topics  . Smoking status: Former Smoker    Quit date: 03/25/1998  . Smokeless tobacco: Not on file  . Alcohol Use: 0.6 oz/week  1 Cans of beer per week      Family History Family History  Problem Relation Age of Onset  . Heart disease Father       Prior to Admission medications   Medication Sig Start Date End Date Taking? Authorizing Provider  atenolol (TENORMIN) 100 MG tablet TAKE 1 TABLET BY MOUTH EVERY DAY 07/15/13  Yes Dorena Cookey, MD  atorvastatin (LIPITOR) 40 MG tablet Take 1 tablet (40 mg total) by mouth daily. 07/18/13  Yes Dorena Cookey, MD  esomeprazole (NEXIUM) 40 MG capsule Take 1 capsule (40 mg total) by mouth daily before breakfast. Patient taking differently: Take 40 mg by mouth daily as needed (acid reflux).  07/15/13 07/15/14 Yes Dorena Cookey, MD  hydroxypropyl methylcellulose / hypromellose (ISOPTO TEARS / GONIOVISC) 2.5 % ophthalmic solution Place 1 drop into both eyes 4 (four) times daily as needed for dry eyes.   Yes Historical Provider, MD  lisinopril  (PRINIVIL,ZESTRIL) 40 MG tablet Take 1 tablet (40 mg total) by mouth daily. 02/24/14  Yes Dorena Cookey, MD  niacin 50 MG tablet Take 50 mg by mouth daily.   Yes Historical Provider, MD  nitroGLYCERIN (NITROSTAT) 0.4 MG SL tablet Place 1 tablet (0.4 mg total) under the tongue every 5 (five) minutes as needed. 07/15/13  Yes Dorena Cookey, MD  OVER THE COUNTER MEDICATION Take 1 capsule by mouth daily.    Yes Historical Provider, MD  oxymetazoline (AFRIN) 0.05 % nasal spray Place 2 sprays into both nostrils every evening.   Yes Historical Provider, MD  sildenafil (VIAGRA) 100 MG tablet Take 1 tablet (100 mg total) by mouth daily as needed. Patient taking differently: Take 100 mg by mouth daily as needed for erectile dysfunction.  07/15/13  Yes Dorena Cookey, MD  tamsulosin (FLOMAX) 0.4 MG CAPS capsule Take 0.4 mg by mouth daily.   Yes Historical Provider, MD  warfarin (COUMADIN) 5 MG tablet Take as directed by anticoagulation clinic Patient taking differently: Take 5 mg by mouth daily.  07/15/13  Yes Dorena Cookey, MD  doxazosin (CARDURA) 8 MG tablet Take 1 tablet (8 mg total) by mouth daily. Patient not taking: Reported on 03/21/2014 09/09/13   Dorena Cookey, MD  hydrochlorothiazide (HYDRODIURIL) 50 MG tablet Take 50 mg by mouth daily.    Historical Provider, MD    Allergies  Allergen Reactions  . Other Other (See Comments)    Some form of numbing medication used by dentist, almost died    Physical Exam  Vitals  Blood pressure 157/78, pulse 64, temperature 98.3 F (36.8 C), temperature source Oral, resp. rate 12, height 6' (1.829 m), weight 104.327 kg (230 lb), SpO2 94 %.   1. General elderly white male lying in bed in NAD,     2. Normal affect and insight, Not Suicidal or Homicidal, Awake Alert, Oriented X 3.  3. No F.N deficits, ALL C.Nerves Intact, Strength 5/5 all 4 extremities, Sensation intact all 4 extremities, Plantars down going.  4. Ears and Eyes appear Normal, Conjunctivae  clear, PERRLA. Moist Oral Mucosa.  5. Supple Neck, No JVD, No cervical lymphadenopathy appriciated, No Carotid Bruits.  6. Symmetrical Chest wall movement, Good air movement bilaterally, CTAB.  7. RRR, No Gallops, Rubs or Murmurs, No Parasternal Heave.  8. No Bowel Sounds, Abdomen Soft but distended, mild generalized  tenderness, No organomegaly appriciated,No rebound -guarding or rigidity.  9.  No Cyanosis, Normal Skin Turgor, No Skin Rash or Bruise.  10. Good  muscle tone,  joints appear normal , no effusions, Normal ROM.  11. No Palpable Lymph Nodes in Neck or Axillae     Data Review  CBC  Recent Labs Lab 03/21/14 0213  WBC 5.7  HGB 15.3  HCT 46.2  PLT 194  MCV 88.7  MCH 29.4  MCHC 33.1  RDW 14.7  LYMPHSABS 2.5  MONOABS 0.6  EOSABS 0.1  BASOSABS 0.1   ------------------------------------------------------------------------------------------------------------------  Chemistries   Recent Labs Lab 03/21/14 0213  NA 141  K 4.1  CL 106  CO2 30  GLUCOSE 109*  BUN 12  CREATININE 0.94  CALCIUM 10.1  AST 42*  ALT 29  ALKPHOS 49  BILITOT 0.7   ------------------------------------------------------------------------------------------------------------------ estimated creatinine clearance is 86.1 mL/min (by C-G formula based on Cr of 0.94). ------------------------------------------------------------------------------------------------------------------ No results for input(s): TSH, T4TOTAL, T3FREE, THYROIDAB in the last 72 hours.  Invalid input(s): FREET3   Coagulation profile  Recent Labs Lab 03/21/14 0543  INR 2.24*   ------------------------------------------------------------------------------------------------------------------- No results for input(s): DDIMER in the last 72 hours. -------------------------------------------------------------------------------------------------------------------  Cardiac Enzymes No results for input(s): CKMB,  TROPONINI, MYOGLOBIN in the last 168 hours.  Invalid input(s): CK ------------------------------------------------------------------------------------------------------------------ Invalid input(s): POCBNP   ---------------------------------------------------------------------------------------------------------------  Urinalysis    Component Value Date/Time   COLORURINE YELLOW 03/21/2014 0420   APPEARANCEUR CLEAR 03/21/2014 0420   LABSPEC 1.017 03/21/2014 0420   PHURINE 5.0 03/21/2014 0420   GLUCOSEU NEGATIVE 03/21/2014 0420   HGBUR SMALL* 03/21/2014 0420   HGBUR 1+ 02/13/2007 0000   BILIRUBINUR NEGATIVE 03/21/2014 0420   BILIRUBINUR n 07/15/2013 1017   KETONESUR NEGATIVE 03/21/2014 0420   PROTEINUR NEGATIVE 03/21/2014 0420   PROTEINUR trace 07/15/2013 1017   UROBILINOGEN 0.2 03/21/2014 0420   UROBILINOGEN 0.2 07/15/2013 1017   NITRITE NEGATIVE 03/21/2014 0420   NITRITE pos 07/15/2013 1017   LEUKOCYTESUR SMALL* 03/21/2014 0420    ----------------------------------------------------------------------------------------------------------------  Imaging results:   Ct Abdomen Pelvis W Contrast  03/21/2014   CLINICAL DATA:  Mid abdominal pain beginning last night, worsening since arrival in emergency department. History of constipation. History of hernia repair and reflux disease.  EXAM: CT ABDOMEN AND PELVIS WITH CONTRAST  TECHNIQUE: Multidetector CT imaging of the abdomen and pelvis was performed using the standard protocol following bolus administration of intravenous contrast.  CONTRAST:  189mL OMNIPAQUE IOHEXOL 300 MG/ML  SOLN  COMPARISON:  CT of the abdomen and pelvis May 21, 2009  FINDINGS: LUNG BASES: Included view of the lung bases demonstrates dependent atelectatic this Cough the heart appears mildly enlarged with coronary artery calcifications. No pericardial fluid collections.  SOLID ORGANS: The liver, spleen, gallbladder, pancreas and adrenal glands are unremarkable.   GASTROINTESTINAL TRACT: Multiple loops of dilated small bowel with inflammation though LEFT lower quadrant measure up to 4.1 cm. Multiple transition points and, suspected superimposed possible internal hernia. Large bowel is decompressed.  KIDNEYS/ URINARY TRACT: Kidneys are orthotopic, demonstrating symmetric enhancement. No nephrolithiasis, hydronephrosis or solid renal masses. Too small to characterize hypodensities in the kidneys bilaterally. 1 cm exophytic LEFT lower pole cyst. The unopacified ureters are normal in course and caliber. Delayed imaging through the kidneys demonstrates symmetric prompt contrast excretion within the proximal urinary collecting system. Urinary bladder is partially distended and unremarkable.  PERITONEUM/RETROPERITONEUM: Aortoiliac vessels are normal in course, mild infrarenal aortic ectasia with moderate calcific atherosclerosis. Mildly ectatic RIGHT Common iliac artery. No lymphadenopathy by CT size criteria. Moderate prostatomegaly. Small amount of ascites in the LEFT abdomen.  SOFT TISSUE/OSSEOUS STRUCTURES: Non-suspicious. Extensive anterior abdominal wall  scarring. Multiple small the moderate fat containing ventral hernias. Moderate to severe sacroiliac osteoarthrosis. Severe lower lumbar facet arthropathy, severe L3-4, L4-5 neural foraminal narrowing on the RIGHT.  IMPRESSION: Partial versus early bowel obstruction with multiple transition points. This may reflect adhesions though, suspect superimposed internal hernia. Small amount of ascites in the LEFT abdomen is likely reactive.  Extensive abdominal wall scarring with fat containing ventral hernias.   Electronically Signed   By: Elon Alas   On: 03/21/2014 05:17    My personal review of EKG: Rhythm NSR,   no Acute ST changes    Assessment & Plan    1. Small bowel obstruction. Supportive care, nothing by mouth, IV fluids, pain control and nausea control, IV PPI, general surgery following. If nausea gets  worse or he develops emesis will place NG tube.   2. CAD. Status post angioplasty. No acute issues, EKG sinus. Supportive care.   3. Essential hypertension. Nothing by mouth, as needed IV hydralazine.   4. BPH. Monitor as alpha blockers were held due to nothing by mouth status.   5. GERD. IV PPI.   6.history of DVT. Pharmacy consulted for IV heparin once INR below 2.      DVT Prophylaxis Heparin    AM Labs Ordered, also please review Full Orders  Family Communication: Admission, patients condition and plan of care including tests being ordered have been discussed with the patient  who indicates understanding and agree with the plan and Code Status.  Code Status Full  Likely DC to  Home  Condition Fair  Time spent in minutes :35    William Norman K M.D on 03/21/2014 at 9:23 AM  Between 7am to 7pm - Pager - [phone]  After 7pm go to [email or web address] - [facility] Office  [phone]

## 2014-03-21 NOTE — Progress Notes (Addendum)
ANTICOAGULATION CONSULT NOTE - Initial Consult  Pharmacy Consult for heparin Indication: DVT  Allergies  Allergen Reactions  . Other Other (See Comments)    Some form of numbing medication used by dentist, almost died    Patient Measurements: Height: 6' (182.9 cm) Weight: 230 lb (104.327 kg) IBW/kg (Calculated) : 77.6   Vital Signs: Temp: 98.3 F (36.8 C) (02/05 0717) Temp Source: Oral (02/05 0717) BP: 157/78 mmHg (02/05 0717) Pulse Rate: 64 (02/05 0717)  Labs:  Recent Labs  03/21/14 0213 03/21/14 0543  HGB 15.3  --   HCT 46.2  --   PLT 194  --   APTT  --  38*  LABPROT  --  25.0*  INR  --  2.24*  CREATININE 0.94  --     Estimated Creatinine Clearance: 86.1 mL/min (by C-G formula based on Cr of 0.94).   Medical History: Past Medical History  Diagnosis Date  . CAD (coronary artery disease)   . Hyperlipidemia   . Hypertension   . BPH (benign prostatic hypertrophy)   . GERD (gastroesophageal reflux disease)   . Eczema   . DVT (deep venous thrombosis)     Medications:  Prescriptions prior to admission  Medication Sig Dispense Refill Last Dose  . atenolol (TENORMIN) 100 MG tablet TAKE 1 TABLET BY MOUTH EVERY DAY 100 tablet 3 03/20/2014 at 0830  . atorvastatin (LIPITOR) 40 MG tablet Take 1 tablet (40 mg total) by mouth daily. 90 tablet 3 03/20/2014 at Unknown time  . esomeprazole (NEXIUM) 40 MG capsule Take 1 capsule (40 mg total) by mouth daily before breakfast. (Patient taking differently: Take 40 mg by mouth daily as needed (acid reflux). ) 90 capsule 3 03/20/2014 at Unknown time  . hydroxypropyl methylcellulose / hypromellose (ISOPTO TEARS / GONIOVISC) 2.5 % ophthalmic solution Place 1 drop into both eyes 4 (four) times daily as needed for dry eyes.   03/20/2014 at Unknown time  . lisinopril (PRINIVIL,ZESTRIL) 40 MG tablet Take 1 tablet (40 mg total) by mouth daily. 90 tablet 2 03/20/2014 at Unknown time  . niacin 50 MG tablet Take 50 mg by mouth daily.   03/20/2014 at  Unknown time  . nitroGLYCERIN (NITROSTAT) 0.4 MG SL tablet Place 1 tablet (0.4 mg total) under the tongue every 5 (five) minutes as needed. 25 tablet 1 couple years  . OVER THE COUNTER MEDICATION Take 1 capsule by mouth daily.    03/20/2014 at Unknown time  . oxymetazoline (AFRIN) 0.05 % nasal spray Place 2 sprays into both nostrils every evening.   03/20/2014 at Unknown time  . sildenafil (VIAGRA) 100 MG tablet Take 1 tablet (100 mg total) by mouth daily as needed. (Patient taking differently: Take 100 mg by mouth daily as needed for erectile dysfunction. ) 6 tablet 5 never  . tamsulosin (FLOMAX) 0.4 MG CAPS capsule Take 0.4 mg by mouth daily.   03/20/2014 at Unknown time  . warfarin (COUMADIN) 5 MG tablet Take as directed by anticoagulation clinic (Patient taking differently: Take 5 mg by mouth daily. ) 90 tablet 3 03/20/2014 at Unknown time  . doxazosin (CARDURA) 8 MG tablet Take 1 tablet (8 mg total) by mouth daily. (Patient not taking: Reported on 03/21/2014) 30 tablet 3   . hydrochlorothiazide (HYDRODIURIL) 50 MG tablet Take 50 mg by mouth daily.   Taking    Assessment: 74 yo man admitted with abdominal pain.  He was on coumadin for h/o DVT.  His last dose was 2/4.  INR this  am is therapeutic and heparin will start when INR <2.0. Goal of Therapy:  Heparin level 0.3-0.7 units/ml Monitor platelets by anticoagulation protocol: Yes   Plan:  Will check PT/INR later today.  Thanks for allowing pharmacy to be a part of this patient's care.  Excell Seltzer, PharmD Clinical Pharmacist, [phone] 03/21/2014,9:54 AM

## 2014-03-22 ENCOUNTER — Inpatient Hospital Stay (HOSPITAL_COMMUNITY): Payer: Medicare HMO

## 2014-03-22 DIAGNOSIS — R1084 Generalized abdominal pain: Secondary | ICD-10-CM

## 2014-03-22 DIAGNOSIS — I10 Essential (primary) hypertension: Secondary | ICD-10-CM

## 2014-03-22 DIAGNOSIS — I2510 Atherosclerotic heart disease of native coronary artery without angina pectoris: Secondary | ICD-10-CM

## 2014-03-22 DIAGNOSIS — K5669 Other intestinal obstruction: Secondary | ICD-10-CM

## 2014-03-22 DIAGNOSIS — Z86718 Personal history of other venous thrombosis and embolism: Secondary | ICD-10-CM

## 2014-03-22 LAB — BASIC METABOLIC PANEL
Anion gap: 8 (ref 5–15)
BUN: 10 mg/dL (ref 6–23)
CO2: 25 mmol/L (ref 19–32)
Calcium: 8.8 mg/dL (ref 8.4–10.5)
Chloride: 102 mmol/L (ref 96–112)
Creatinine, Ser: 0.86 mg/dL (ref 0.50–1.35)
GFR calc Af Amer: >90 mL/min (ref >90)
GFR calc non Af Amer: 83 mL/min — ABNORMAL LOW (ref >90)
Glucose, Bld: 138 mg/dL — ABNORMAL HIGH (ref 70–99)
Potassium: 3.6 mmol/L (ref 3.5–5.1)
Sodium: 135 mmol/L (ref 135–145)

## 2014-03-22 LAB — HEPARIN LEVEL (UNFRACTIONATED)
Heparin Unfractionated: 0.53 IU/mL (ref 0.30–0.70)
Heparin Unfractionated: 0.53 IU/mL (ref 0.30–0.70)

## 2014-03-22 LAB — CBC
HCT: 44.5 % (ref 39.0–52.0)
Hemoglobin: 14.7 g/dL (ref 13.0–17.0)
MCH: 29.2 pg (ref 26.0–34.0)
MCHC: 33 g/dL (ref 30.0–36.0)
MCV: 88.3 fL (ref 78.0–100.0)
Platelets: 178 10*3/uL (ref 150–400)
RBC: 5.04 MIL/uL (ref 4.22–5.81)
RDW: 15 % (ref 11.5–15.5)
WBC: 8.7 10*3/uL (ref 4.0–10.5)

## 2014-03-22 IMAGING — CR DG ABD PORTABLE 1V
2 series · 2 of 2 positions shown · non-contrast
Comparison: CT abdomen/ pelvis [DATE]

CLINICAL DATA: 73-year-old male with 4 days of constipation,
abdominal pain and distention.

EXAM:
PORTABLE ABDOMEN - 1 VIEW

[AP (1 of 2)]
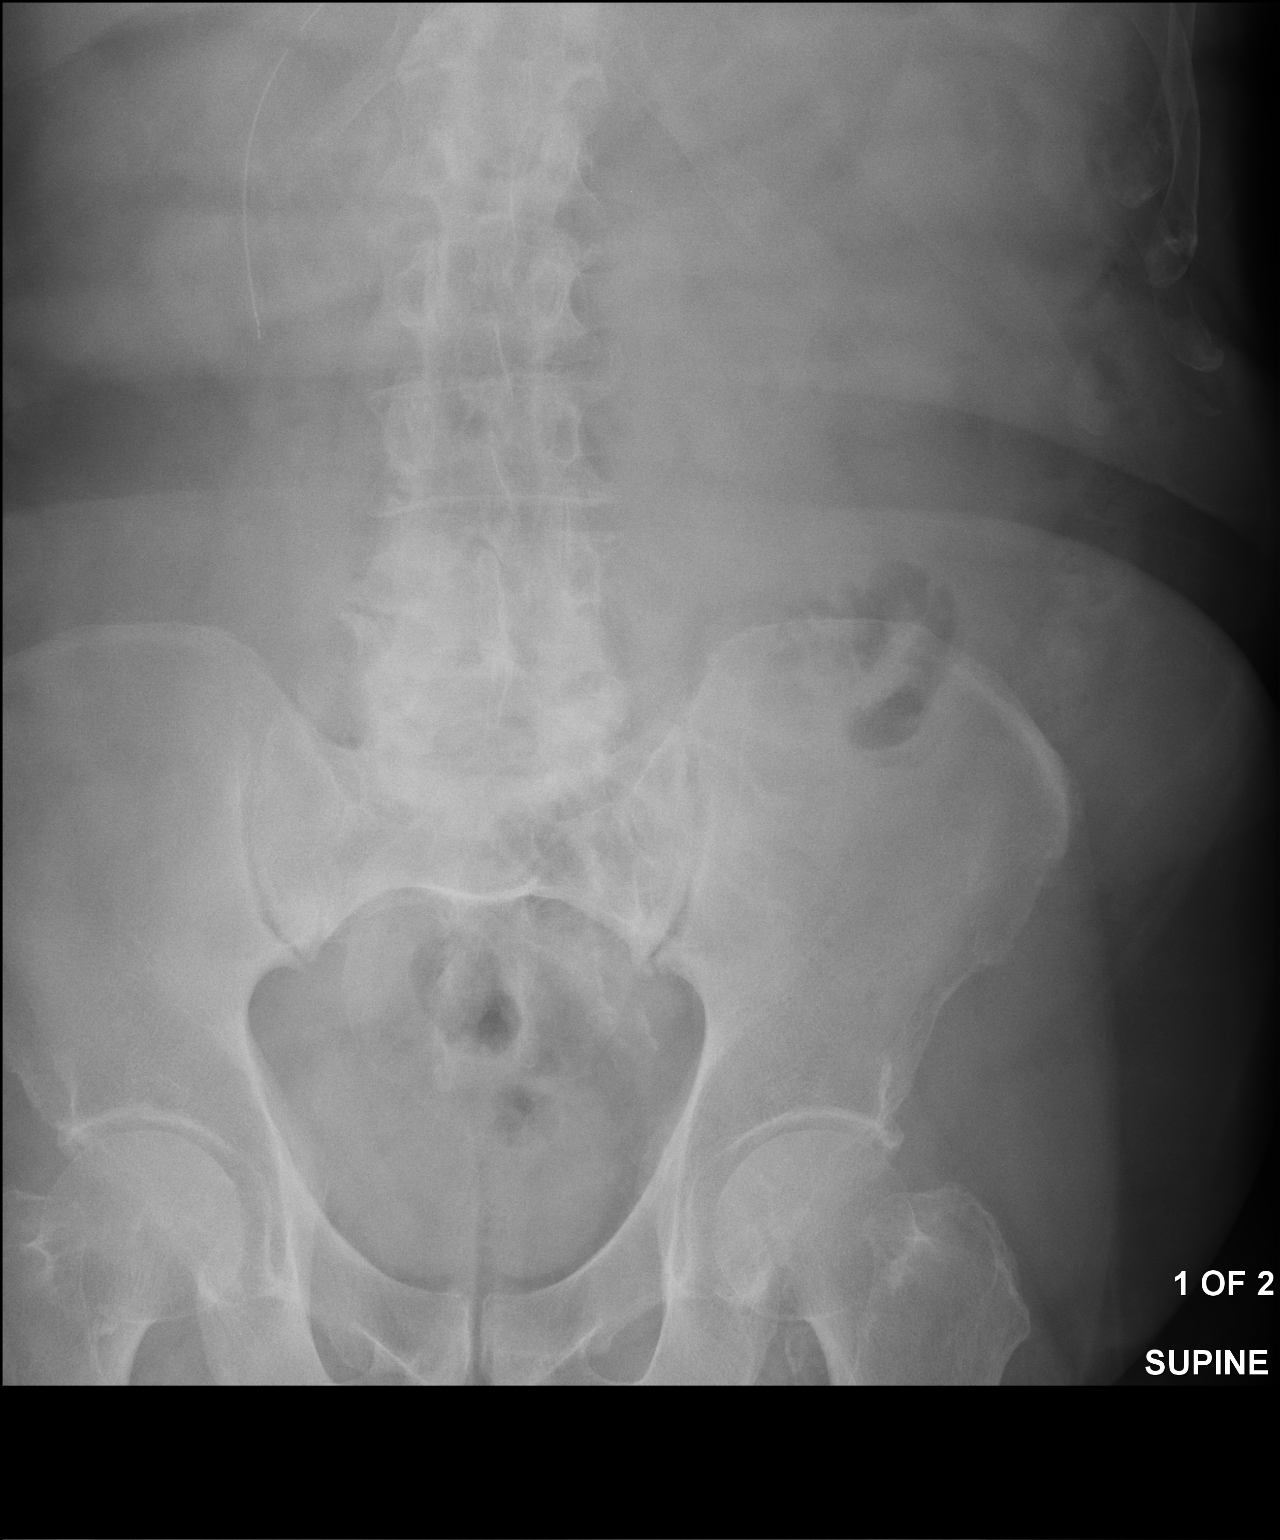

[AP (2 of 2)]
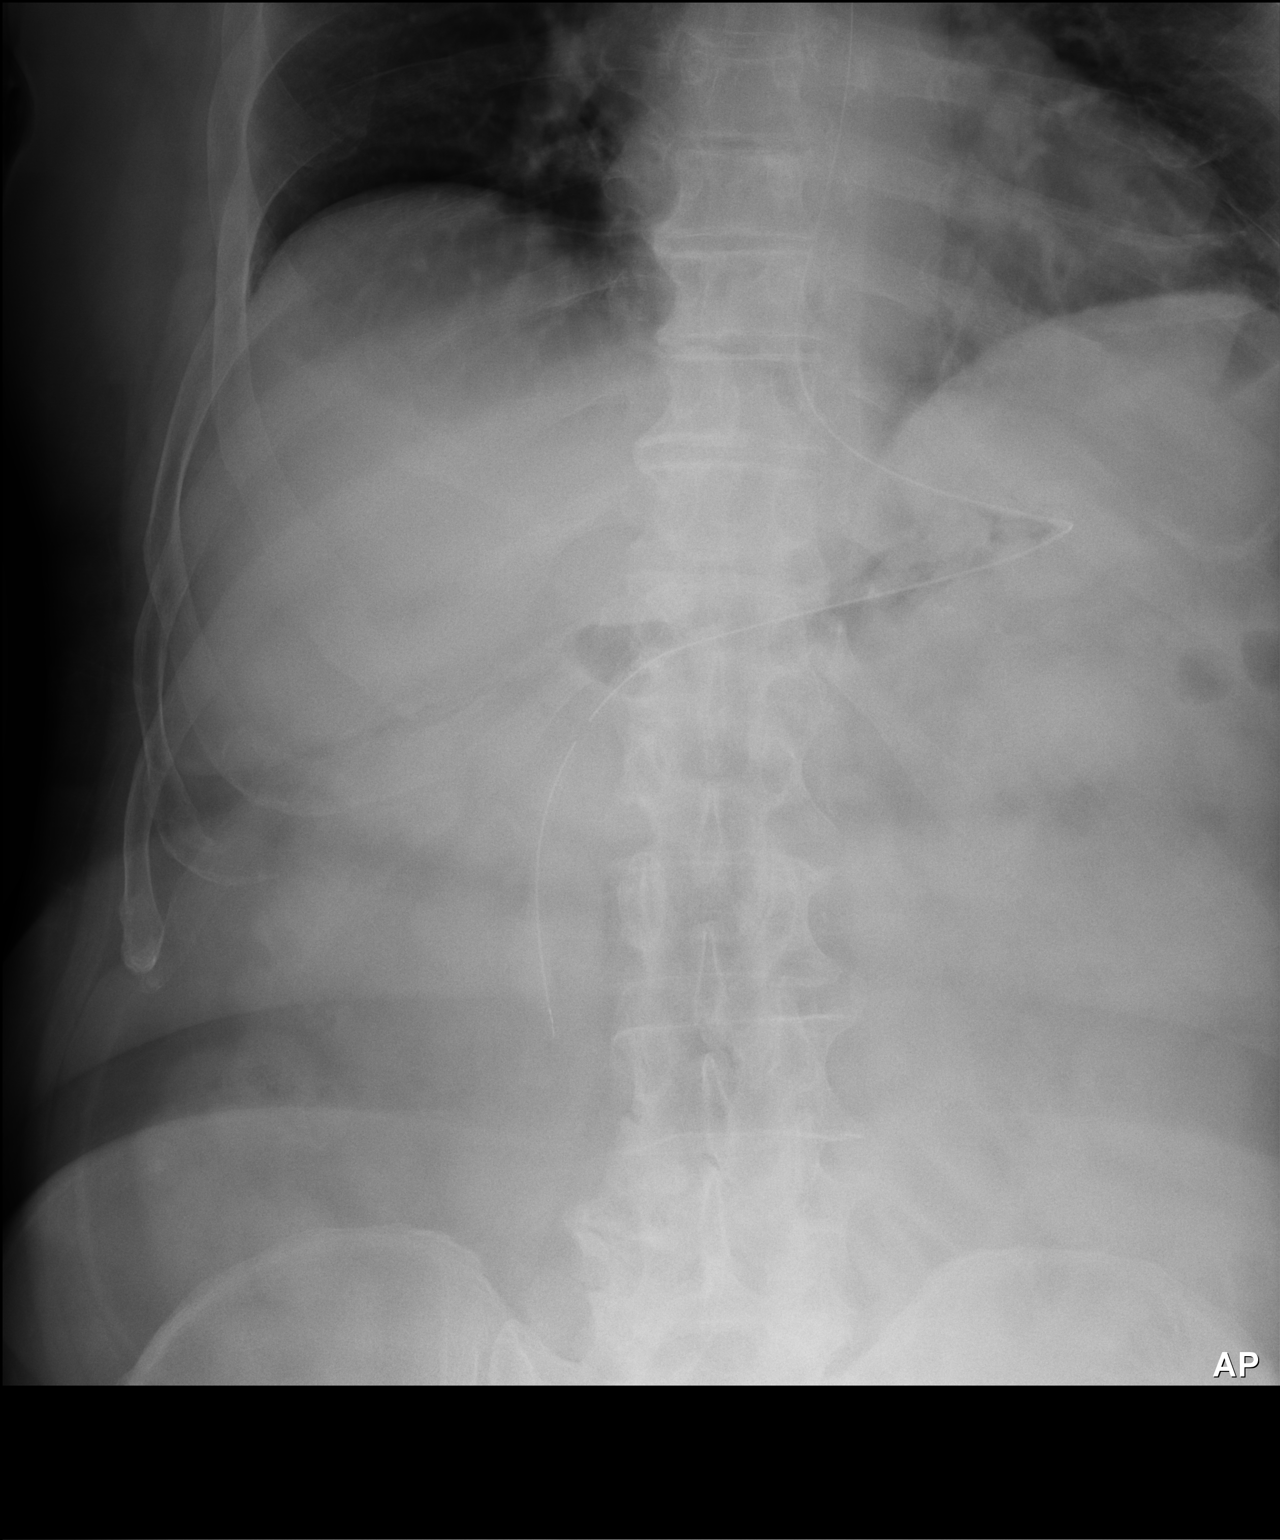

[2 of 2 positions shown; findings below may reference images not displayed]

FINDINGS: The tip of the nasogastric tube is in the descending duodenum. The
bowel gas pattern is not obstructed. There is a small amount of gas
in normal caliber small bowel in the lower abdomen. The colon has
very little gas overall. Lower lumbar degenerative disc disease and
facet arthropathy.
IMPRESSION: 1. The tip of the nasogastric tube is at the junction of the second
and third portions of the duodenum.
2. Nonobstructed bowel gas pattern. Of note, obstructed, non aerated
but fluid-filled loops of small bowel may be missed on conventional
radiography.

## 2014-03-22 MED ORDER — POTASSIUM CHLORIDE 10 MEQ/100ML IV SOLN
10.0000 meq | INTRAVENOUS | Status: AC
  Administered 2014-03-22 (×3): 10 meq via INTRAVENOUS
  Filled 2014-03-22 (×3): qty 100

## 2014-03-22 NOTE — Progress Notes (Signed)
[facility] PROGRESS NOTE   William Norman ERX:[id number] DOB: May 08, 1940 DOA: 03/21/2014 PCP: Joycelyn Man, MD  HPI/Subjective: Feels better than yesterday, not passing any gas or bowel movement for the past 3 days.  Assessment/Plan: Principal Problem:   SBO (small bowel obstruction) Active Problems:   Essential hypertension   CAD (coronary artery disease), native coronary artery   Personal history of DVT (deep vein thrombosis)   Abdominal pain   AP (abdominal pain)   Small bowel obstruction Supportive care, nothing by mouth, IV fluids, pain control and nausea control, IV PPI General surgery following.  NG tube placed, low intermittent suction. Follow clinically, check KUB, per general surgery to the OR if not improving in the next 24-48 hours.  CAD. Status post angioplasty No acute issues, EKG sinus. Supportive care. Denies any recent chest pain or other issues.  Essential hypertension Nothing by mouth, as needed IV hydralazine, reasonable control so far.  BPH Monitor as alpha blockers were held due to nothing by mouth status.  GERD IV PPI.  History of DVT Patient is on Coumadin, held anticipating surgery.  INR is 1.9, check INR in a.m. heparin when INR less than 2, if surgery scheduled, ALT 68 hours prior to surgery.  Code Status: Full Code Family Communication: Plan discussed with the patient. Disposition Plan: Remains inpatient Diet:    Consultants:  General surgery  Procedures:  NG tube placed  Antibiotics:  None    Objective: Filed Vitals:   03/22/14 0502  BP: 147/72  Pulse:   Temp: 98.2 F (36.8 C)  Resp: 18    Intake/Output Summary (Last 24 hours) at 03/22/14 1329 Last data filed at 03/22/14 0730  Gross per 24 hour  Intake 657.22 ml  Output   1700 ml  Net -1042.78 ml   Filed Weights   03/21/14 0157 03/22/14 0502  Weight: 104.327 kg (230 lb) 101.9 kg (224 lb 10.4 oz)    Exam: General: Alert and awake,  oriented x3, not in any acute distress. HEENT: anicteric sclera, pupils reactive to light and accommodation, EOMI CVS: S1-S2 clear, no murmur rubs or gallops Chest: clear to auscultation bilaterally, no wheezing, rales or rhonchi Abdomen: soft nontender, nondistended, normal bowel sounds, no organomegaly Extremities: no cyanosis, clubbing or edema noted bilaterally Neuro: Cranial nerves II-XII intact, no focal neurological deficits  Data Reviewed: Basic Metabolic Panel:  Recent Labs Lab 03/21/14 0213 03/22/14 0813  NA 141 135  K 4.1 3.6  CL 106 102  CO2 30 25  GLUCOSE 109* 138*  BUN 12 10  CREATININE 0.94 0.86  CALCIUM 10.1 8.8   Liver Function Tests:  Recent Labs Lab 03/21/14 0213  AST 42*  ALT 29  ALKPHOS 49  BILITOT 0.7  PROT 6.8  ALBUMIN 4.1    Recent Labs Lab 03/21/14 0213  LIPASE 26   No results for input(s): AMMONIA in the last 168 hours. CBC:  Recent Labs Lab 03/21/14 0213 03/22/14 0813  WBC 5.7 8.7  NEUTROABS 2.4  --   HGB 15.3 14.7  HCT 46.2 44.5  MCV 88.7 88.3  PLT 194 178   Cardiac Enzymes: No results for input(s): CKTOTAL, CKMB, CKMBINDEX, TROPONINI in the last 168 hours. BNP (last 3 results) No results for input(s): BNP in the last 8760 hours.  ProBNP (last 3 results) No results for input(s): PROBNP in the last 8760 hours.  CBG: No results for input(s): GLUCAP in the last 168 hours.  Micro No results found for this or any previous  visit (from the past 240 hour(s)).   Studies: Ct Abdomen Pelvis W Contrast  03/21/2014   CLINICAL DATA:  Mid abdominal pain beginning last night, worsening since arrival in emergency department. History of constipation. History of hernia repair and reflux disease.  EXAM: CT ABDOMEN AND PELVIS WITH CONTRAST  TECHNIQUE: Multidetector CT imaging of the abdomen and pelvis was performed using the standard protocol following bolus administration of intravenous contrast.  CONTRAST:  129mL OMNIPAQUE IOHEXOL 300  MG/ML  SOLN  COMPARISON:  CT of the abdomen and pelvis May 21, 2009  FINDINGS: LUNG BASES: Included view of the lung bases demonstrates dependent atelectatic this Cough the heart appears mildly enlarged with coronary artery calcifications. No pericardial fluid collections.  SOLID ORGANS: The liver, spleen, gallbladder, pancreas and adrenal glands are unremarkable.  GASTROINTESTINAL TRACT: Multiple loops of dilated small bowel with inflammation though LEFT lower quadrant measure up to 4.1 cm. Multiple transition points and, suspected superimposed possible internal hernia. Large bowel is decompressed.  KIDNEYS/ URINARY TRACT: Kidneys are orthotopic, demonstrating symmetric enhancement. No nephrolithiasis, hydronephrosis or solid renal masses. Too small to characterize hypodensities in the kidneys bilaterally. 1 cm exophytic LEFT lower pole cyst. The unopacified ureters are normal in course and caliber. Delayed imaging through the kidneys demonstrates symmetric prompt contrast excretion within the proximal urinary collecting system. Urinary bladder is partially distended and unremarkable.  PERITONEUM/RETROPERITONEUM: Aortoiliac vessels are normal in course, mild infrarenal aortic ectasia with moderate calcific atherosclerosis. Mildly ectatic RIGHT Common iliac artery. No lymphadenopathy by CT size criteria. Moderate prostatomegaly. Small amount of ascites in the LEFT abdomen.  SOFT TISSUE/OSSEOUS STRUCTURES: Non-suspicious. Extensive anterior abdominal wall scarring. Multiple small the moderate fat containing ventral hernias. Moderate to severe sacroiliac osteoarthrosis. Severe lower lumbar facet arthropathy, severe L3-4, L4-5 neural foraminal narrowing on the RIGHT.  IMPRESSION: Partial versus early bowel obstruction with multiple transition points. This may reflect adhesions though, suspect superimposed internal hernia. Small amount of ascites in the LEFT abdomen is likely reactive.  Extensive abdominal wall  scarring with fat containing ventral hernias.   Electronically Signed   By: Elon Alas   On: 03/21/2014 05:17    Scheduled Meds: . pantoprazole (PROTONIX) IV  40 mg Intravenous Q12H   Continuous Infusions: . heparin 1,450 Units/hr (03/22/14 0730)       Time spent: 35 minutes    [facility]  [facility] Pager [phone] If 7PM-7AM, please contact night-coverage at [email or web address], password [facility] 03/22/2014, 1:29 PM  LOS: 1 day

## 2014-03-22 NOTE — Progress Notes (Signed)
ANTICOAGULATION CONSULT NOTE - Follow-up Consult  Pharmacy Consult for heparin Indication: h/o DVT  Allergies  Allergen Reactions  . Other Other (See Comments)    Some form of numbing medication used by dentist, almost died    Patient Measurements: Height: 6' (182.9 cm) Weight: 224 lb 10.4 oz (101.9 kg) IBW/kg (Calculated) : 77.6  Heparin dosing wt: 106 kg   Vital Signs: Temp: 98.2 F (36.8 C) (02/06 0502) Temp Source: Oral (02/06 0502) BP: 147/72 mmHg (02/06 0502)  Labs:  Recent Labs  03/21/14 0213 03/21/14 0543 03/21/14 1956 03/22/14 0813 03/22/14 1136  HGB 15.3  --   --  14.7  --   HCT 46.2  --   --  44.5  --   PLT 194  --   --  178  --   APTT  --  38*  --   --   --   LABPROT  --  25.0* 22.1*  --   --   INR  --  2.24* 1.92*  --   --   HEPARINUNFRC  --   --   --   --  0.53  CREATININE 0.94  --   --  0.86  --     Estimated Creatinine Clearance: 93.1 mL/min (by C-G formula based on Cr of 0.86).   Assessment: 74 yo man admitted with abdominal pain.  He was on coumadin for h/o DVT.  His last dose was 2/4. Heparin was started as INR <2.0.  Initial heparin level is 0.53 units/ml.    Goal of Therapy:  Heparin level 0.3-0.7 units/ml Monitor platelets by anticoagulation protocol: Yes   Plan:  1. Cont Heparin gtt at 1450 units/hr.  2. Will f/u 8hr heparin level to confirm 3. Daily heparin level and CBC  Thanks for allowing pharmacy to be a part of this patient's care.  Excell Seltzer. PharmD  03/22/2014,12:30 PM

## 2014-03-22 NOTE — Progress Notes (Signed)
ANTICOAGULATION CONSULT NOTE - Follow-up Consult  Pharmacy Consult for heparin Indication: h/o DVT  Allergies  Allergen Reactions  . Other Other (See Comments)    Some form of numbing medication used by dentist, almost died    Patient Measurements: Height: 6' (182.9 cm) Weight: 224 lb 10.4 oz (101.9 kg) IBW/kg (Calculated) : 77.6  Heparin dosing wt: 106 kg   Vital Signs: Temp: 97.9 F (36.6 C) (02/06 1534) Temp Source: Oral (02/06 1534) BP: 151/80 mmHg (02/06 1534) Pulse Rate: 69 (02/06 1534)  Labs:  Recent Labs  03/21/14 0213 03/21/14 0543 03/21/14 1956 03/22/14 0813 03/22/14 1136 03/22/14 1837  HGB 15.3  --   --  14.7  --   --   HCT 46.2  --   --  44.5  --   --   PLT 194  --   --  178  --   --   APTT  --  38*  --   --   --   --   LABPROT  --  25.0* 22.1*  --   --   --   INR  --  2.24* 1.92*  --   --   --   HEPARINUNFRC  --   --   --   --  0.53 0.53  CREATININE 0.94  --   --  0.86  --   --     Estimated Creatinine Clearance: 93.1 mL/min (by C-G formula based on Cr of 0.86).  Assessment: 74 yo man admitted with abdominal pain.  He was on coumadin for h/o DVT.  His last dose was 2/4. Heparin was started as INR <2.0. Repeat heparin level remains therapeutic.   Goal of Therapy:  Heparin level 0.3-0.7 units/ml Monitor platelets by anticoagulation protocol: Yes   Plan:  1. Cont Heparin gtt at 1450 units/hr.  2. F/u AM heparin level and CBC  Salome Arnt, PharmD, BCPS Pager # [phone] 03/22/2014 7:24 PM

## 2014-03-22 NOTE — Progress Notes (Signed)
[facility] Progress Note     Subjective: Pt  States overnight he got very nauseated and threw up thus an NG was placed.  He is since feeling much better.  NG has put out 1532mL since placement.  He feels less distended, no more N/V.  Abdominal pain improved, but still in upper abdomen.     Objective: Vital signs in last 24 hours: Temp:  [98.2 F (36.8 C)-98.6 F (37 C)] 98.2 F (36.8 C) (02/06 0502) Pulse Rate:  [69-73] 73 (02/05 2057) Resp:  [18-20] 18 (02/06 0502) BP: (147-183)/(72-84) 147/72 mmHg (02/06 0502) SpO2:  [92 %-98 %] 92 % (02/06 0502) Weight:  [224 lb 10.4 oz (101.9 kg)] 224 lb 10.4 oz (101.9 kg) (02/06 0502) Last BM Date: 03/19/14  Intake/Output from previous day: 02/05 0701 - 02/06 0700 In: 547.5 [I.V.:547.5] Out: 1700 [Urine:200; Emesis/NG output:1500] Intake/Output this shift: Total I/O In: 109.7 [I.V.:109.7] Out: -   PE: Gen:  Alert, NAD, pleasant Abd: Soft, distended (mildly less than yesterday), tender in the upper abdomen, few scattered BS, no HSM, abdominal scars noted   Lab Results:   Recent Labs  03/21/14 0213  WBC 5.7  HGB 15.3  HCT 46.2  PLT 194   BMET  Recent Labs  03/21/14 0213  NA 141  K 4.1  CL 106  CO2 30  GLUCOSE 109*  BUN 12  CREATININE 0.94  CALCIUM 10.1   PT/INR  Recent Labs  03/21/14 0543 03/21/14 1956  LABPROT 25.0* 22.1*  INR 2.24* 1.92*   CMP     Component Value Date/Time   NA 141 03/21/2014 0213   K 4.1 03/21/2014 0213   CL 106 03/21/2014 0213   CO2 30 03/21/2014 0213   GLUCOSE 109* 03/21/2014 0213   GLUCOSE 94 01/10/2006 1108   BUN 12 03/21/2014 0213   CREATININE 0.94 03/21/2014 0213   CALCIUM 10.1 03/21/2014 0213   PROT 6.8 03/21/2014 0213   ALBUMIN 4.1 03/21/2014 0213   AST 42* 03/21/2014 0213   ALT 29 03/21/2014 0213   ALKPHOS 49 03/21/2014 0213   BILITOT 0.7 03/21/2014 0213   GFRNONAA 80* 03/21/2014 0213   GFRAA >90 03/21/2014 0213   Lipase     Component Value  Date/Time   LIPASE 26 03/21/2014 0213       Studies/Results: Ct Abdomen Pelvis W Contrast  03/21/2014   CLINICAL DATA:  Mid abdominal pain beginning last night, worsening since arrival in emergency department. History of constipation. History of hernia repair and reflux disease.  EXAM: CT ABDOMEN AND PELVIS WITH CONTRAST  TECHNIQUE: Multidetector CT imaging of the abdomen and pelvis was performed using the standard protocol following bolus administration of intravenous contrast.  CONTRAST:  127mL OMNIPAQUE IOHEXOL 300 MG/ML  SOLN  COMPARISON:  CT of the abdomen and pelvis May 21, 2009  FINDINGS: LUNG BASES: Included view of the lung bases demonstrates dependent atelectatic this Cough the heart appears mildly enlarged with coronary artery calcifications. No pericardial fluid collections.  SOLID ORGANS: The liver, spleen, gallbladder, pancreas and adrenal glands are unremarkable.  GASTROINTESTINAL TRACT: Multiple loops of dilated small bowel with inflammation though LEFT lower quadrant measure up to 4.1 cm. Multiple transition points and, suspected superimposed possible internal hernia. Large bowel is decompressed.  KIDNEYS/ URINARY TRACT: Kidneys are orthotopic, demonstrating symmetric enhancement. No nephrolithiasis, hydronephrosis or solid renal masses. Too small to characterize hypodensities in the kidneys bilaterally. 1 cm exophytic LEFT lower pole cyst. The unopacified ureters are normal in course and  caliber. Delayed imaging through the kidneys demonstrates symmetric prompt contrast excretion within the proximal urinary collecting system. Urinary bladder is partially distended and unremarkable.  PERITONEUM/RETROPERITONEUM: Aortoiliac vessels are normal in course, mild infrarenal aortic ectasia with moderate calcific atherosclerosis. Mildly ectatic RIGHT Common iliac artery. No lymphadenopathy by CT size criteria. Moderate prostatomegaly. Small amount of ascites in the LEFT abdomen.  SOFT TISSUE/OSSEOUS  STRUCTURES: Non-suspicious. Extensive anterior abdominal wall scarring. Multiple small the moderate fat containing ventral hernias. Moderate to severe sacroiliac osteoarthrosis. Severe lower lumbar facet arthropathy, severe L3-4, L4-5 neural foraminal narrowing on the RIGHT.  IMPRESSION: Partial versus early bowel obstruction with multiple transition points. This may reflect adhesions though, suspect superimposed internal hernia. Small amount of ascites in the LEFT abdomen is likely reactive.  Extensive abdominal wall scarring with fat containing ventral hernias.   Electronically Signed   By: Elon Alas   On: 03/21/2014 05:17    Anti-infectives: Anti-infectives    None       Assessment/Plan pSBO likely due to adhesions Colectomy/colostomy and subsequent takedown for diverticulitis 2010 Dr. Dalbert Batman CAD  HLD/HTN BPH GERD DVT normally anticoagulated on Coumadin  Plan: 1. Continue present management, slight improvement since NG in place.  Await improvement in bowel function, if not improvement in 24-48hrs may need surgical intervention. 2. Pain meds, antiemetics 3.  Ambulate and IS 4.  SCD's and on heparin drip, coumadin being held 5.  Ordered KUB, will check to see if CT contrast is in colon, repeat labs tomorrow    LOS: 1 day    DORT, Lazaria Schaben 03/22/2014, 7:58 AM Pager: [phone]

## 2014-03-23 LAB — CBC
HCT: 43.3 % (ref 39.0–52.0)
Hemoglobin: 14 g/dL (ref 13.0–17.0)
MCH: 28.8 pg (ref 26.0–34.0)
MCHC: 32.3 g/dL (ref 30.0–36.0)
MCV: 89.1 fL (ref 78.0–100.0)
Platelets: 151 10*3/uL (ref 150–400)
RBC: 4.86 MIL/uL (ref 4.22–5.81)
RDW: 14.9 % (ref 11.5–15.5)
WBC: 5.2 10*3/uL (ref 4.0–10.5)

## 2014-03-23 LAB — BASIC METABOLIC PANEL
Anion gap: 4 — ABNORMAL LOW (ref 5–15)
BUN: 10 mg/dL (ref 6–23)
CO2: 29 mmol/L (ref 19–32)
Calcium: 9 mg/dL (ref 8.4–10.5)
Chloride: 106 mmol/L (ref 96–112)
Creatinine, Ser: 0.88 mg/dL (ref 0.50–1.35)
GFR calc Af Amer: >90 mL/min (ref >90)
GFR calc non Af Amer: 83 mL/min — ABNORMAL LOW (ref >90)
Glucose, Bld: 93 mg/dL (ref 70–99)
Potassium: 3.8 mmol/L (ref 3.5–5.1)
Sodium: 139 mmol/L (ref 135–145)

## 2014-03-23 LAB — HEPARIN LEVEL (UNFRACTIONATED): Heparin Unfractionated: 0.46 IU/mL (ref 0.30–0.70)

## 2014-03-23 MED ORDER — POTASSIUM CHLORIDE CRYS ER 20 MEQ PO TBCR
40.0000 meq | EXTENDED_RELEASE_TABLET | Freq: Once | ORAL | Status: AC
  Administered 2014-03-23: 40 meq via ORAL
  Filled 2014-03-23: qty 2

## 2014-03-23 NOTE — Progress Notes (Signed)
[facility] Progress Note     Subjective: Pt notes significant improvement.  Had 3 BM's.  Feels great since distension and pain resolved.  No N/V, ambulating well.  Thirsty/hungry.  Having good flatus.  Objective: Vital signs in last 24 hours: Temp:  [97.9 F (36.6 C)-98.9 F (37.2 C)] 98.9 F (37.2 C) (02/07 0501) Pulse Rate:  [57-69] 57 (02/07 0501) Resp:  [16] 16 (02/07 0501) BP: (151-161)/(79-80) 161/79 mmHg (02/07 0501) SpO2:  [91 %-93 %] 91 % (02/07 0501) Weight:  [220 lb 3.8 oz (99.9 kg)] 220 lb 3.8 oz (99.9 kg) (02/07 0501) Last BM Date: 03/22/14  Intake/Output from previous day: 02/06 0701 - 02/07 0700 In: 850.3 [I.V.:550.3; IV Piggyback:300] Out: 750 [Urine:400; Emesis/NG output:350] Intake/Output this shift:    PE: Gen:  Alert, NAD, pleasant Abd: Soft, NT/ND, +BS, no HSM, abdominal scars noted   Lab Results:   Recent Labs  03/22/14 0813 03/23/14 0700  WBC 8.7 5.2  HGB 14.7 14.0  HCT 44.5 43.3  PLT 178 151   BMET  Recent Labs  03/21/14 0213 03/22/14 0813  NA 141 135  K 4.1 3.6  CL 106 102  CO2 30 25  GLUCOSE 109* 138*  BUN 12 10  CREATININE 0.94 0.86  CALCIUM 10.1 8.8   PT/INR  Recent Labs  03/21/14 0543 03/21/14 1956  LABPROT 25.0* 22.1*  INR 2.24* 1.92*   CMP     Component Value Date/Time   NA 135 03/22/2014 0813   K 3.6 03/22/2014 0813   CL 102 03/22/2014 0813   CO2 25 03/22/2014 0813   GLUCOSE 138* 03/22/2014 0813   GLUCOSE 94 01/10/2006 1108   BUN 10 03/22/2014 0813   CREATININE 0.86 03/22/2014 0813   CALCIUM 8.8 03/22/2014 0813   PROT 6.8 03/21/2014 0213   ALBUMIN 4.1 03/21/2014 0213   AST 42* 03/21/2014 0213   ALT 29 03/21/2014 0213   ALKPHOS 49 03/21/2014 0213   BILITOT 0.7 03/21/2014 0213   GFRNONAA 83* 03/22/2014 0813   GFRAA >90 03/22/2014 0813   Lipase     Component Value Date/Time   LIPASE 26 03/21/2014 0213       Studies/Results: Dg Abd Portable 1v  03/22/2014   CLINICAL DATA:   74 year old male with 4 days of constipation, abdominal pain and distention.  EXAM: PORTABLE ABDOMEN - 1 VIEW  COMPARISON:  CT abdomen/ pelvis 03/21/2014  FINDINGS: The tip of the nasogastric tube is in the descending duodenum. The bowel gas pattern is not obstructed. There is a small amount of gas in normal caliber small bowel in the lower abdomen. The colon has very little gas overall. Lower lumbar degenerative disc disease and facet arthropathy.  IMPRESSION: 1. The tip of the nasogastric tube is at the junction of the second and third portions of the duodenum. 2. Nonobstructed bowel gas pattern. Of note, obstructed, non aerated but fluid-filled loops of small bowel may be missed on conventional radiography.   Electronically Signed   By: Jacqulynn Cadet M.D.   On: 03/22/2014 14:25    Anti-infectives: Anti-infectives    None       Assessment/Plan pSBO likely due to adhesions Colectomy/colostomy and subsequent takedown for diverticulitis 2010 Dr. Dalbert Batman CAD  HLD/HTN BPH GERD DVT normally anticoagulated on Coumadin  Plan: 1. Improved, clamp NG tube for 6 hours, allow sips of clears, check residuals after 6 hours and if improved then d/c ng and start clear liquid tray. 2. Pain meds, antiemetics 3. Ambulate and  IS 4. SCD's and on heparin drip, coumadin being held, can be resumed when we know he's tolerating a diet well 5. Some air seen in colon on film yesterday.    LOS: 2 days    Coralie Keens 03/23/2014, 7:58 AM Pager: [phone]

## 2014-03-23 NOTE — Progress Notes (Signed)
[facility] PROGRESS NOTE   William Norman DVV:[id number] DOB: 1940-10-16 DOA: 03/21/2014 PCP: Joycelyn Man, MD  HPI/Subjective: Feels much better today, had 3 bowel movements yesterday. Continue to ambulate without problems.  Assessment/Plan: Principal Problem:   SBO (small bowel obstruction) Active Problems:   Essential hypertension   CAD (coronary artery disease), native coronary artery   Personal history of DVT (deep vein thrombosis)   Abdominal pain   AP (abdominal pain)   Small bowel obstruction Supportive care, nothing by mouth, IV fluids, pain control and nausea control, IV PPI General surgery following.  Had 3 bowel movements yesterday, NG tube clamped, trial of clears per general surgery. Continue ambulation.  CAD. Status post angioplasty No acute issues, EKG sinus. Supportive care. Denies any recent chest pain or other issues.  Essential hypertension Nothing by mouth, as needed IV hydralazine, reasonable control so far.  BPH Monitor as alpha blockers were held due to nothing by mouth status.  GERD IV PPI.  History of DVT Patient is on Coumadin, held anticipating surgery.  INR is 1.9, check INR in a.m. heparin when INR less than 2, if surgery scheduled, ALT 68 hours prior to surgery.  Code Status: Full Code Family Communication: Plan discussed with the patient. Disposition Plan: Remains inpatient Diet: Diet NPO time specified Except for: Ice Chips, Sips with Meds, Other (See Comments)  Consultants:  General surgery  Procedures:  NG tube placed  Antibiotics:  None    Objective: Filed Vitals:   03/23/14 0501  BP: 161/79  Pulse: 57  Temp: 98.9 F (37.2 C)  Resp: 16    Intake/Output Summary (Last 24 hours) at 03/23/14 1201 Last data filed at 03/23/14 0600  Gross per 24 hour  Intake 740.56 ml  Output    750 ml  Net  -9.44 ml   Filed Weights   03/21/14 0157 03/22/14 0502 03/23/14 0501  Weight: 104.327 kg (230 lb)  101.9 kg (224 lb 10.4 oz) 99.9 kg (220 lb 3.8 oz)    Exam: General: Alert and awake, oriented x3, not in any acute distress. HEENT: anicteric sclera, pupils reactive to light and accommodation, EOMI CVS: S1-S2 clear, no murmur rubs or gallops Chest: clear to auscultation bilaterally, no wheezing, rales or rhonchi Abdomen: soft nontender, nondistended, normal bowel sounds, no organomegaly Extremities: no cyanosis, clubbing or edema noted bilaterally Neuro: Cranial nerves II-XII intact, no focal neurological deficits  Data Reviewed: Basic Metabolic Panel:  Recent Labs Lab 03/21/14 0213 03/22/14 0813 03/23/14 0700  NA 141 135 139  K 4.1 3.6 3.8  CL 106 102 106  CO2 30 25 29   GLUCOSE 109* 138* 93  BUN 12 10 10   CREATININE 0.94 0.86 0.88  CALCIUM 10.1 8.8 9.0   Liver Function Tests:  Recent Labs Lab 03/21/14 0213  AST 42*  ALT 29  ALKPHOS 49  BILITOT 0.7  PROT 6.8  ALBUMIN 4.1    Recent Labs Lab 03/21/14 0213  LIPASE 26   No results for input(s): AMMONIA in the last 168 hours. CBC:  Recent Labs Lab 03/21/14 0213 03/22/14 0813 03/23/14 0700  WBC 5.7 8.7 5.2  NEUTROABS 2.4  --   --   HGB 15.3 14.7 14.0  HCT 46.2 44.5 43.3  MCV 88.7 88.3 89.1  PLT 194 178 151   Cardiac Enzymes: No results for input(s): CKTOTAL, CKMB, CKMBINDEX, TROPONINI in the last 168 hours. BNP (last 3 results) No results for input(s): BNP in the last 8760 hours.  ProBNP (last 3 results)  No results for input(s): PROBNP in the last 8760 hours.  CBG: No results for input(s): GLUCAP in the last 168 hours.  Micro No results found for this or any previous visit (from the past 240 hour(s)).   Studies: Dg Abd Portable 1v  03/22/2014   CLINICAL DATA:  74 year old male with 4 days of constipation, abdominal pain and distention.  EXAM: PORTABLE ABDOMEN - 1 VIEW  COMPARISON:  CT abdomen/ pelvis 03/21/2014  FINDINGS: The tip of the nasogastric tube is in the descending duodenum. The bowel  gas pattern is not obstructed. There is a small amount of gas in normal caliber small bowel in the lower abdomen. The colon has very little gas overall. Lower lumbar degenerative disc disease and facet arthropathy.  IMPRESSION: 1. The tip of the nasogastric tube is at the junction of the second and third portions of the duodenum. 2. Nonobstructed bowel gas pattern. Of note, obstructed, non aerated but fluid-filled loops of small bowel may be missed on conventional radiography.   Electronically Signed   By: Jacqulynn Cadet M.D.   On: 03/22/2014 14:25    Scheduled Meds: . pantoprazole (PROTONIX) IV  40 mg Intravenous Q12H   Continuous Infusions: . heparin 1,450 Units/hr (03/23/14 0600)       Time spent: 35 minutes    [facility] A  [facility] Pager [phone] If 7PM-7AM, please contact night-coverage at [email or web address], password [email or web address] 03/23/2014, 12:01 PM  LOS: 2 days

## 2014-03-23 NOTE — Progress Notes (Signed)
received pt in bed asleep,  IV  Heparin drip infusing at 14.5 cc / hr. IV site intact and WNL. NGT to LI WS, draining greenish drainage. No acute distress  noted. Will continue to monitor pt.

## 2014-03-23 NOTE — Progress Notes (Signed)
ANTICOAGULATION CONSULT NOTE - Follow-up Consult  Pharmacy Consult for heparin Indication: h/o DVT  Allergies  Allergen Reactions  . Other Other (See Comments)    Some form of numbing medication used by dentist, almost died    Patient Measurements: Height: 6' (182.9 cm) Weight: 220 lb 3.8 oz (99.9 kg) IBW/kg (Calculated) : 77.6  Heparin dosing wt: 106 kg   Vital Signs: Temp: 98.9 F (37.2 C) (02/07 0501) Temp Source: Oral (02/07 0501) BP: 161/79 mmHg (02/07 0501) Pulse Rate: 57 (02/07 0501)  Labs:  Recent Labs  03/21/14 0213 03/21/14 0543 03/21/14 1956 03/22/14 0813 03/22/14 1136 03/22/14 1837 03/23/14 0700  HGB 15.3  --   --  14.7  --   --  14.0  HCT 46.2  --   --  44.5  --   --  43.3  PLT 194  --   --  178  --   --  151  APTT  --  38*  --   --   --   --   --   LABPROT  --  25.0* 22.1*  --   --   --   --   INR  --  2.24* 1.92*  --   --   --   --   HEPARINUNFRC  --   --   --   --  0.53 0.53 0.46  CREATININE 0.94  --   --  0.86  --   --  0.88    Estimated Creatinine Clearance: 90.1 mL/min (by C-G formula based on Cr of 0.88).  Assessment: 74 yo man admitted with abdominal pain.  He was on coumadin for h/o DVT.  His last dose was 2/4. Heparin was started as INR <2.0. Heparin level remains therapeutic.  No bleeding noted  Goal of Therapy:  Heparin level 0.3-0.7 units/ml Monitor platelets by anticoagulation protocol: Yes   Plan:  1. Cont Heparin gtt at 1450 units/hr.  2. F/u AM heparin level and CBC Thanks for allowing pharmacy to be a part of this patient's care.  Excell Seltzer, PharmD Clinical Pharmacist, [phone]   03/23/2014 10:09 AM

## 2014-03-24 LAB — CBC
HCT: 46.2 % (ref 39.0–52.0)
Hemoglobin: 15 g/dL (ref 13.0–17.0)
MCH: 29.4 pg (ref 26.0–34.0)
MCHC: 32.5 g/dL (ref 30.0–36.0)
MCV: 90.4 fL (ref 78.0–100.0)
Platelets: 175 10*3/uL (ref 150–400)
RBC: 5.11 MIL/uL (ref 4.22–5.81)
RDW: 14.5 % (ref 11.5–15.5)
WBC: 5.6 10*3/uL (ref 4.0–10.5)

## 2014-03-24 LAB — PROTIME-INR
INR: 1.21 (ref 0.00–1.49)
Prothrombin Time: 15.4 seconds — ABNORMAL HIGH (ref 11.6–15.2)

## 2014-03-24 LAB — BASIC METABOLIC PANEL
Anion gap: 8 (ref 5–15)
BUN: 14 mg/dL (ref 6–23)
CO2: 26 mmol/L (ref 19–32)
Calcium: 9.3 mg/dL (ref 8.4–10.5)
Chloride: 105 mmol/L (ref 96–112)
Creatinine, Ser: 0.94 mg/dL (ref 0.50–1.35)
GFR calc Af Amer: >90 mL/min (ref >90)
GFR calc non Af Amer: 80 mL/min — ABNORMAL LOW (ref >90)
Glucose, Bld: 84 mg/dL (ref 70–99)
Potassium: 4 mmol/L (ref 3.5–5.1)
Sodium: 139 mmol/L (ref 135–145)

## 2014-03-24 LAB — URINE CULTURE: Colony Count: 7000

## 2014-03-24 LAB — HEPARIN LEVEL (UNFRACTIONATED): Heparin Unfractionated: 0.43 IU/mL (ref 0.30–0.70)

## 2014-03-24 MED ORDER — WARFARIN SODIUM 7.5 MG PO TABS
7.5000 mg | ORAL_TABLET | Freq: Once | ORAL | Status: AC
  Administered 2014-03-24: 7.5 mg via ORAL
  Filled 2014-03-24: qty 1

## 2014-03-24 MED ORDER — BISACODYL 10 MG RE SUPP: 10.0000 mg | Freq: Every day | RECTAL | Status: DC | PRN

## 2014-03-24 MED ORDER — ARTIFICIAL TEARS OP OINT
TOPICAL_OINTMENT | OPHTHALMIC | Status: DC | PRN
Start: 2014-03-24 — End: 2014-03-25
  Administered 2014-03-24: 11:00:00 via OPHTHALMIC
  Filled 2014-03-24: qty 3.5

## 2014-03-24 MED ORDER — WARFARIN - PHARMACIST DOSING INPATIENT
Freq: Every day | Status: DC
  Administered 2014-03-24: 1

## 2014-03-24 MED ORDER — DOCUSATE SODIUM 100 MG PO CAPS
100.0000 mg | ORAL_CAPSULE | Freq: Two times a day (BID) | ORAL | Status: DC
  Administered 2014-03-24 – 2014-03-25 (×3): 100 mg via ORAL
  Filled 2014-03-24 (×2): qty 1

## 2014-03-24 NOTE — Progress Notes (Signed)
ANTICOAGULATION CONSULT NOTE - Follow-up Consult  Pharmacy Consult for heparin + Warfarin Indication: h/o DVT  Allergies  Allergen Reactions  . Other Other (See Comments)    Some form of numbing medication used by dentist, almost died    Patient Measurements: Height: 6' (182.9 cm) Weight: 217 lb 2.5 oz (98.5 kg) IBW/kg (Calculated) : 77.6  Heparin dosing wt: 98 kg   Vital Signs: Temp: 97.8 F (36.6 C) (02/08 1332) Temp Source: Oral (02/08 1332) BP: 134/84 mmHg (02/08 1332) Pulse Rate: 99 (02/08 1332)  Labs:  Recent Labs  03/21/14 1956  03/22/14 0813  03/22/14 1837 03/23/14 0700 03/24/14 0752 03/24/14 1025  HGB  --   < > 14.7  --   --  14.0 15.0  --   HCT  --   --  44.5  --   --  43.3 46.2  --   PLT  --   --  178  --   --  151 175  --   LABPROT 22.1*  --   --   --   --   --   --  15.4*  INR 1.92*  --   --   --   --   --   --  1.21  HEPARINUNFRC  --   --   --   < > 0.53 0.46 0.43  --   CREATININE  --   --  0.86  --   --  0.88 0.94  --   < > = values in this interval not displayed.  Estimated Creatinine Clearance: 83.9 mL/min (by C-G formula based on Cr of 0.94).  Assessment: 54 YOM on warfarin PTA for hx DVT - held on admit and transitioned to Heparin while awaiting resolution of SBO. This is now noted to be resolving, surgery is not planning any interventions. Pharmacy has received approval to resume warfarin dosing tonight.   Heparin level this morning remains therapeutic (HL 0.43, goal of 0.3-0.7), INR is SUBtherapeutic (INR 1.21, goal of 2-3). CBC wnl - no bleeding noted.   Goal of Therapy:  INR 2-3 Heparin level 0.3-0.7 units/ml Monitor platelets by anticoagulation protocol: Yes   Plan:  1. Continue heparin at the current rate of 1450 units/hr (14.5 ml/hr) 2. Warfarin 7.5 mg x 1 dose at 1800 today 3. Will continue to monitor for any signs/symptoms of bleeding and will follow up with PT/INR and HL in the AM  Alycia Rossetti, PharmD, BCPS Clinical  Pharmacist Pager: [phone] 03/24/2014 1:59 PM

## 2014-03-24 NOTE — Progress Notes (Signed)
[facility] PROGRESS NOTE   William Norman SJG:[id number] DOB: 1940/02/23 DOA: 03/21/2014 PCP: Joycelyn Man, MD  HPI/Subjective: Patient did very well overnight, no nausea or vomiting. Tolerated clears very well. Started on clears and advanced to full per general surgery at lunchtime.  Assessment/Plan: Principal Problem:   SBO (small bowel obstruction) Active Problems:   Essential hypertension   CAD (coronary artery disease), native coronary artery   Personal history of DVT (deep vein thrombosis)   Abdominal pain   AP (abdominal pain)   Small bowel obstruction Supportive care, nothing by mouth, IV fluids, pain control and nausea control, IV PPI General surgery following.  Had 3 bowel movements yesterday, NG tube clamped, trial of clears per general surgery. Started on clears, full liquids by today likely to be discharged in a.m.  CAD. Status post angioplasty No acute issues, EKG sinus. Supportive care. Denies any recent chest pain or other issues.  Essential hypertension Nothing by mouth, as needed IV hydralazine, reasonable control so far.  BPH Monitor as alpha blockers were held due to nothing by mouth status.  GERD IV PPI.  History of DVT Patient is on Coumadin, held anticipating surgery.  INR is 1.2, restarted Coumadin, can be discharge on Coumadin/Lovenox bridge in a.m.  I will discuss with them if he prefers to be on one of the new novel anticoagulants.  Code Status: Full Code Family Communication: Plan discussed with the patient. Disposition Plan: Remains inpatient Diet: Diet full liquid DIET SOFT  Consultants:  General surgery  Procedures:  NG tube placed  Antibiotics:  None    Objective: Filed Vitals:   03/24/14 1332  BP: 134/84  Pulse: 99  Temp: 97.8 F (36.6 C)  Resp: 20    Intake/Output Summary (Last 24 hours) at 03/24/14 1615 Last data filed at 03/24/14 1605  Gross per 24 hour  Intake  860.5 ml  Output     595 ml  Net  265.5 ml   Filed Weights   03/22/14 0502 03/23/14 0501 03/24/14 0705  Weight: 101.9 kg (224 lb 10.4 oz) 99.9 kg (220 lb 3.8 oz) 98.5 kg (217 lb 2.5 oz)    Exam: General: Alert and awake, oriented x3, not in any acute distress. HEENT: anicteric sclera, pupils reactive to light and accommodation, EOMI CVS: S1-S2 clear, no murmur rubs or gallops Chest: clear to auscultation bilaterally, no wheezing, rales or rhonchi Abdomen: soft nontender, nondistended, normal bowel sounds, no organomegaly Extremities: no cyanosis, clubbing or edema noted bilaterally Neuro: Cranial nerves II-XII intact, no focal neurological deficits  Data Reviewed: Basic Metabolic Panel:  Recent Labs Lab 03/21/14 0213 03/22/14 0813 03/23/14 0700 03/24/14 0752  NA 141 135 139 139  K 4.1 3.6 3.8 4.0  CL 106 102 106 105  CO2 30 25 29 26   GLUCOSE 109* 138* 93 84  BUN 12 10 10 14   CREATININE 0.94 0.86 0.88 0.94  CALCIUM 10.1 8.8 9.0 9.3   Liver Function Tests:  Recent Labs Lab 03/21/14 0213  AST 42*  ALT 29  ALKPHOS 49  BILITOT 0.7  PROT 6.8  ALBUMIN 4.1    Recent Labs Lab 03/21/14 0213  LIPASE 26   No results for input(s): AMMONIA in the last 168 hours. CBC:  Recent Labs Lab 03/21/14 0213 03/22/14 0813 03/23/14 0700 03/24/14 0752  WBC 5.7 8.7 5.2 5.6  NEUTROABS 2.4  --   --   --   HGB 15.3 14.7 14.0 15.0  HCT 46.2 44.5 43.3 46.2  MCV 88.7  88.3 89.1 90.4  PLT 194 178 151 175   Cardiac Enzymes: No results for input(s): CKTOTAL, CKMB, CKMBINDEX, TROPONINI in the last 168 hours. BNP (last 3 results) No results for input(s): BNP in the last 8760 hours.  ProBNP (last 3 results) No results for input(s): PROBNP in the last 8760 hours.  CBG: No results for input(s): GLUCAP in the last 168 hours.  Micro Recent Results (from the past 240 hour(s))  Urine culture     Status: None   Collection Time: 03/22/14 10:54 AM  Result Value Ref Range Status   Specimen Description  URINE, RANDOM  Final   Special Requests NONE  Final   Colony Count   Final    7,000 COLONIES/ML Performed at [facility]    Culture   Final    INSIGNIFICANT GROWTH Performed at [facility]    Report Status 03/24/2014 FINAL  Final     Studies: No results found.  Scheduled Meds: . docusate sodium  100 mg Oral BID  . pantoprazole (PROTONIX) IV  40 mg Intravenous Q12H  . warfarin  7.5 mg Oral ONCE-1800  . Warfarin - Pharmacist Dosing Inpatient   Does not apply q1800   Continuous Infusions: . heparin 1,450 Units/hr (03/24/14 0352)       Time spent: 35 minutes    [facility] A  [facility] Pager [phone] If 7PM-7AM, please contact night-coverage at [email or web address], password [facility] 03/24/2014, 4:15 PM  LOS: 3 days

## 2014-03-24 NOTE — Plan of Care (Signed)
Problem: Phase III Progression Outcomes Goal: Activity at appropriate level-compared to baseline (UP IN CHAIR FOR HEMODIALYSIS)  Outcome: Completed/Met Date Met:  03/24/14 Patient ambulating the halls without complications.

## 2014-03-24 NOTE — Progress Notes (Signed)
NG tube was D/C by MD order. Patient tolerated well clear liquid diet. No complaints of nausea/vomiting, no pain. Will continue to monitor.

## 2014-03-24 NOTE — Progress Notes (Signed)
[facility] Progress Note     Subjective: Pt doing well.  He says the nurse yesterday never clamped and removed his NG tube yesterday as ordered.  He denies any N/V or abdominal pain.  Having great gas but no BM yet.  Ambulating well through the halls.  Objective: Vital signs in last 24 hours: Temp:  [98 F (36.7 C)-98.4 F (36.9 C)] 98 F (36.7 C) (02/08 0705) Pulse Rate:  [66-72] 66 (02/08 0705) Resp:  [18-20] 18 (02/08 0705) BP: (136-168)/(70-80) 163/70 mmHg (02/08 0705) SpO2:  [94 %-95 %] 94 % (02/08 0705) Weight:  [217 lb 2.5 oz (98.5 kg)] 217 lb 2.5 oz (98.5 kg) (02/08 0705) Last BM Date: 03/22/14  Intake/Output from previous day: 02/07 0701 - 02/08 0700 In: 338.5 [P.O.:150; I.V.:188.5] Out: 325 [Urine:250; Emesis/NG output:75] Intake/Output this shift: Total I/O In: -  Out: 250 [Urine:100; Emesis/NG output:150]  PE: Gen:  Alert, NAD, pleasant Abd: Soft, NT/ND, +BS, no HSM   Lab Results:   Recent Labs  03/22/14 0813 03/23/14 0700  WBC 8.7 5.2  HGB 14.7 14.0  HCT 44.5 43.3  PLT 178 151   BMET  Recent Labs  03/22/14 0813 03/23/14 0700  NA 135 139  K 3.6 3.8  CL 102 106  CO2 25 29  GLUCOSE 138* 93  BUN 10 10  CREATININE 0.86 0.88  CALCIUM 8.8 9.0   PT/INR  Recent Labs  03/21/14 1956  LABPROT 22.1*  INR 1.92*   CMP     Component Value Date/Time   NA 139 03/23/2014 0700   K 3.8 03/23/2014 0700   CL 106 03/23/2014 0700   CO2 29 03/23/2014 0700   GLUCOSE 93 03/23/2014 0700   GLUCOSE 94 01/10/2006 1108   BUN 10 03/23/2014 0700   CREATININE 0.88 03/23/2014 0700   CALCIUM 9.0 03/23/2014 0700   PROT 6.8 03/21/2014 0213   ALBUMIN 4.1 03/21/2014 0213   AST 42* 03/21/2014 0213   ALT 29 03/21/2014 0213   ALKPHOS 49 03/21/2014 0213   BILITOT 0.7 03/21/2014 0213   GFRNONAA 83* 03/23/2014 0700   GFRAA >90 03/23/2014 0700   Lipase     Component Value Date/Time   LIPASE 26 03/21/2014 0213       Studies/Results: Dg Abd  Portable 1v  03/22/2014   CLINICAL DATA:  74 year old male with 4 days of constipation, abdominal pain and distention.  EXAM: PORTABLE ABDOMEN - 1 VIEW  COMPARISON:  CT abdomen/ pelvis 03/21/2014  FINDINGS: The tip of the nasogastric tube is in the descending duodenum. The bowel gas pattern is not obstructed. There is a small amount of gas in normal caliber small bowel in the lower abdomen. The colon has very little gas overall. Lower lumbar degenerative disc disease and facet arthropathy.  IMPRESSION: 1. The tip of the nasogastric tube is at the junction of the second and third portions of the duodenum. 2. Nonobstructed bowel gas pattern. Of note, obstructed, non aerated but fluid-filled loops of small bowel may be missed on conventional radiography.   Electronically Signed   By: Jacqulynn Cadet M.D.   On: 03/22/2014 14:25    Anti-infectives: Anti-infectives    None       Assessment/Plan pSBO due to adhesions - resolving Colectomy/colostomy and subsequent takedown for diverticulitis 2010 Dr. Dalbert Batman CAD  HLD/HTN BPH GERD DVT normally anticoagulated on Coumadin  Plan: 1. Improved, NG wasn't clamped yesterday, but he's doing well so will pull it now and start clears.  Advance  to fulls at lunch 2. Pain meds, antiemetics 3. Ambulate and IS 4. SCD's and on heparin drip, coumadin being held, resume coumadin later today if tolerating clears 5. Start colace and duclolax 6.  Anticipate d/c home tomorrow if all goes well    LOS: 3 days    DORT, Lavone Barrientes 03/24/2014, 8:12 AM Pager: [phone]

## 2014-03-24 NOTE — Progress Notes (Signed)
Medicare Important Message given?  YES (If response is "NO", the following Medicare IM given date fields will be blank) Date Medicare IM given:  03/24/14 Medicare IM given by:  Adasha Boehme 

## 2014-03-25 LAB — CBC
HCT: 47.2 % (ref 39.0–52.0)
Hemoglobin: 15.8 g/dL (ref 13.0–17.0)
MCH: 29.8 pg (ref 26.0–34.0)
MCHC: 33.5 g/dL (ref 30.0–36.0)
MCV: 88.9 fL (ref 78.0–100.0)
Platelets: 186 10*3/uL (ref 150–400)
RBC: 5.31 MIL/uL (ref 4.22–5.81)
RDW: 14.9 % (ref 11.5–15.5)
WBC: 4.7 10*3/uL (ref 4.0–10.5)

## 2014-03-25 LAB — PROTIME-INR
INR: 1.32 (ref 0.00–1.49)
Prothrombin Time: 16.5 seconds — ABNORMAL HIGH (ref 11.6–15.2)

## 2014-03-25 LAB — HEPARIN LEVEL (UNFRACTIONATED): Heparin Unfractionated: 0.47 IU/mL (ref 0.30–0.70)

## 2014-03-25 MED ORDER — PANTOPRAZOLE SODIUM 40 MG PO TBEC: 40.0000 mg | DELAYED_RELEASE_TABLET | Freq: Two times a day (BID) | ORAL | Status: DC

## 2014-03-25 MED ORDER — POLYETHYLENE GLYCOL 3350 17 G PO PACK
17.0000 g | PACK | Freq: Every day | ORAL | Status: DC
  Administered 2014-03-25: 17 g via ORAL
  Filled 2014-03-25: qty 1

## 2014-03-25 MED ORDER — WARFARIN SODIUM 7.5 MG PO TABS
7.5000 mg | ORAL_TABLET | Freq: Once | ORAL | Status: DC
  Filled 2014-03-25: qty 1

## 2014-03-25 NOTE — Progress Notes (Signed)
Patient was discharged home by MD order; discharged instructions  review and give to patient with care notes; IV DIC; skin intact; patient will be escorted to the car by nurse tech via wheelchair.  

## 2014-03-25 NOTE — Discharge Instructions (Signed)

## 2014-03-25 NOTE — Progress Notes (Signed)
ANTICOAGULATION CONSULT NOTE - Follow-up Consult  Pharmacy Consult for heparin + Warfarin Indication: h/o DVT  Allergies  Allergen Reactions  . Other Other (See Comments)    Some form of numbing medication used by dentist, almost died    Patient Measurements: Height: 6' (182.9 cm) Weight: 217 lb 2.5 oz (98.5 kg) IBW/kg (Calculated) : 77.6  Heparin dosing wt: 98 kg   Vital Signs: Temp: 97.7 F (36.5 C) (02/09 0548) Temp Source: Oral (02/09 0548) BP: 149/80 mmHg (02/09 0548)  Labs:  Recent Labs  03/23/14 0700 03/24/14 0752 03/24/14 1025 03/25/14 0930  HGB 14.0 15.0  --  15.8  HCT 43.3 46.2  --  47.2  PLT 151 175  --  186  LABPROT  --   --  15.4* 16.5*  INR  --   --  1.21 1.32  HEPARINUNFRC 0.46 0.43  --  0.47  CREATININE 0.88 0.94  --   --     Estimated Creatinine Clearance: 83.9 mL/min (by C-G formula based on Cr of 0.94).  Assessment: 63 YOM on warfarin PTA for hx DVT - held on admit and transitioned to Heparin while awaiting resolution of SBO. This is now noted to be resolving, surgery is not planning any interventions. Warfarin was resumed yetserday.  Heparin level this morning remains therapeutic (HL 0.47, goal of 0.3-0.7), INR is SUBtherapeutic (INR 1.3, goal of 2-3). CBC wnl - no bleeding noted. Pt was taking warfarin 5 mg po daily PTA.  Goal of Therapy:  INR 2-3 Heparin level 0.3-0.7 units/ml Monitor platelets by anticoagulation protocol: Yes   Plan:  1. Continue heparin at the current rate of 1450 units/hr (14.5 ml/hr) 2. Warfarin 7.5 mg x 1  3. Will continue to monitor for any signs/symptoms of bleeding and will follow up with PT/INR and HL in the AM    Hughes Better, PharmD, BCPS Clinical Pharmacist Pager: [phone] 03/25/2014 10:20 AM

## 2014-03-25 NOTE — Progress Notes (Signed)
Patient ID: William Norman, male   DOB: [birth year], 74 y.o.   MRN: [id number]    Subjective: Pt looks great and feels well.  Just had a large BM.  Tolerating regular diet  Objective: Vital signs in last 24 hours: Temp:  [97.7 F (36.5 C)-97.8 F (36.6 C)] 97.7 F (36.5 C) (02/09 0548) Pulse Rate:  [71-99] 71 (02/08 2137) Resp:  [18-20] 18 (02/09 0548) BP: (134-154)/(79-84) 149/80 mmHg (02/09 0548) SpO2:  [93 %-96 %] 95 % (02/09 0548) Weight:  [217 lb 2.5 oz (98.5 kg)] 217 lb 2.5 oz (98.5 kg) (02/09 0548) Last BM Date: 03/22/14  Intake/Output from previous day: 02/08 0701 - 02/09 0700 In: 1012 [P.O.:1012] Out: 570 [Urine:420; Emesis/NG output:150] Intake/Output this shift: Total I/O In: 120 [P.O.:120] Out: -   PE: Abd: soft, Nt, Nd, +BS  Lab Results:   Recent Labs  03/24/14 0752 03/25/14 0930  WBC 5.6 4.7  HGB 15.0 15.8  HCT 46.2 47.2  PLT 175 186   BMET  Recent Labs  03/23/14 0700 03/24/14 0752  NA 139 139  K 3.8 4.0  CL 106 105  CO2 29 26  GLUCOSE 93 84  BUN 10 14  CREATININE 0.88 0.94  CALCIUM 9.0 9.3   PT/INR  Recent Labs  03/24/14 1025 03/25/14 0930  LABPROT 15.4* 16.5*  INR 1.21 1.32   CMP     Component Value Date/Time   NA 139 03/24/2014 0752   K 4.0 03/24/2014 0752   CL 105 03/24/2014 0752   CO2 26 03/24/2014 0752   GLUCOSE 84 03/24/2014 0752   GLUCOSE 94 01/10/2006 1108   BUN 14 03/24/2014 0752   CREATININE 0.94 03/24/2014 0752   CALCIUM 9.3 03/24/2014 0752   PROT 6.8 03/21/2014 0213   ALBUMIN 4.1 03/21/2014 0213   AST 42* 03/21/2014 0213   ALT 29 03/21/2014 0213   ALKPHOS 49 03/21/2014 0213   BILITOT 0.7 03/21/2014 0213   GFRNONAA 80* 03/24/2014 0752   GFRAA >90 03/24/2014 0752   Lipase     Component Value Date/Time   LIPASE 26 03/21/2014 0213       Studies/Results: No results found.  Anti-infectives: Anti-infectives    None       Assessment/Plan  1. SBO, resolved  Plan: 1. Patient is  tolerating a regular diet.  He is stable for dc home from our standpoint.  No surgical follow up required.   LOS: 4 days    Shy Guallpa E 03/25/2014, 11:03 AM Pager: [phone]

## 2014-03-25 NOTE — Discharge Summary (Signed)
Physician Discharge Summary  William Norman DGU:[id number] DOB: [birth year] DOA: 03/21/2014  PCP: Joycelyn Man, MD  Admit date: 03/21/2014 Discharge date: 03/25/2014  Time spent: 40 minutes  Recommendations for Outpatient Follow-up:  1. Follow-up with primary care physician within one week. 2. Check INR within 1 week.  Discharge Diagnoses:  Principal Problem:   SBO (small bowel obstruction) Active Problems:   Essential hypertension   CAD (coronary artery disease), native coronary artery   Personal history of DVT (deep vein thrombosis)   Abdominal pain   AP (abdominal pain)   Discharge Condition: Stable  Diet recommendation: Heart healthy  Filed Weights   03/23/14 0501 03/24/14 0705 03/25/14 0548  Weight: 99.9 kg (220 lb 3.8 oz) 98.5 kg (217 lb 2.5 oz) 98.5 kg (217 lb 2.5 oz)    History of present illness:  William Norman is a 74 y.o. male, with history of CAD status post angioplasty over 15 years ago, DVT on Coumadin, GERD, BPH, status post colon rupture requiring surgery several years ago, BPH, essential hypertension comes to the hospital with 1 day history of constipation, generalized nonradiating dull and constant abdominal pain and bloating, associated with nausea, no aggravating or relieving symptoms.  In the ER blood work, EKG unremarkable, CT scan of the abdomen and pelvis a digestive of partial small bowel obstruction. He was seen by general surgery and hospitalist team was requested to admit. Patient currently is feeling fairly well except for mild generalized abdominal pain and some nausea without emesis, denies any unintentional weight loss, denies any personal or family history of colon cancer or stomach cancers.  Hospital Course:    Small bowel obstruction Supportive care, nothing by mouth, IV fluids, pain control and nausea control, IV PPI General surgery following.  Had 3 bowel movements yesterday, NG tube clamped, trial of clears per general  surgery. Was on clears, advanced to full liquids and tolerated a soft diet since yesterday. Discharged home, I told him to take MiraLAX daily.  CAD. Status post angioplasty No acute issues, EKG sinus. Supportive care. Denies any recent chest pain or other issues.  Essential hypertension Nothing by mouth, as needed IV hydralazine, reasonable control so far.  BPH Monitor as alpha blockers were held due to nothing by mouth status.  GERD IV PPI.  History of DVT Patient is on Coumadin, held anticipating surgery.  INR is 1.32 on discharge, I were to discharge patient in Coumadin/Lovenox bridge. Patient reported that last DVT had was in the mid 90s, he will really prefer not to take any shots. Reported he is very active at home, discharge on his home dose of Coumadin, check INR in 1 week.   Procedures:  Placement and removal of NG tube  Consultations:  General surgery  Discharge Exam: Filed Vitals:   03/25/14 0548  BP: 149/80  Pulse:   Temp: 97.7 F (36.5 C)  Resp: 18   General: Alert and awake, oriented x3, not in any acute distress. HEENT: anicteric sclera, pupils reactive to light and accommodation, EOMI CVS: S1-S2 clear, no murmur rubs or gallops Chest: clear to auscultation bilaterally, no wheezing, rales or rhonchi Abdomen: soft nontender, nondistended, normal bowel sounds, no organomegaly Extremities: no cyanosis, clubbing or edema noted bilaterally Neuro: Cranial nerves II-XII intact, no focal neurological deficits  Discharge Instructions   Discharge Instructions    Diet - low sodium heart healthy    Complete by:  As directed      Increase activity slowly    Complete by:  As directed           Current Discharge Medication List    CONTINUE these medications which have NOT CHANGED   Details  atenolol (TENORMIN) 100 MG tablet TAKE 1 TABLET BY MOUTH EVERY DAY Qty: 100 tablet, Refills: 3   Associated Diagnoses: Unspecified essential hypertension     atorvastatin (LIPITOR) 40 MG tablet Take 1 tablet (40 mg total) by mouth daily. Qty: 90 tablet, Refills: 3    esomeprazole (NEXIUM) 40 MG capsule Take 1 capsule (40 mg total) by mouth daily before breakfast. Qty: 90 capsule, Refills: 3   Associated Diagnoses: GERD (gastroesophageal reflux disease); Esophageal reflux    hydroxypropyl methylcellulose / hypromellose (ISOPTO TEARS / GONIOVISC) 2.5 % ophthalmic solution Place 1 drop into both eyes 4 (four) times daily as needed for dry eyes.    lisinopril (PRINIVIL,ZESTRIL) 40 MG tablet Take 1 tablet (40 mg total) by mouth daily. Qty: 90 tablet, Refills: 2    niacin 50 MG tablet Take 50 mg by mouth daily.    nitroGLYCERIN (NITROSTAT) 0.4 MG SL tablet Place 1 tablet (0.4 mg total) under the tongue every 5 (five) minutes as needed. Qty: 25 tablet, Refills: 1   Associated Diagnoses: Coronary atherosclerosis of unspecified type of vessel, native or graft    OVER THE COUNTER MEDICATION Take 1 capsule by mouth daily.     oxymetazoline (AFRIN) 0.05 % nasal spray Place 2 sprays into both nostrils every evening.    sildenafil (VIAGRA) 100 MG tablet Take 1 tablet (100 mg total) by mouth daily as needed. Qty: 6 tablet, Refills: 5   Associated Diagnoses: ED (erectile dysfunction)    tamsulosin (FLOMAX) 0.4 MG CAPS capsule Take 0.4 mg by mouth daily.    warfarin (COUMADIN) 5 MG tablet Take as directed by anticoagulation clinic Qty: 90 tablet, Refills: 3   Associated Diagnoses: DVT (deep venous thrombosis)       Allergies  Allergen Reactions  . Other Other (See Comments)    Some form of numbing medication used by dentist, almost died   Follow-up Information    Follow up with TODD,JEFFREY ALLEN, MD In 1 week.   Specialty:  Family Medicine   Contact information:   [place] [address] [phone]        The results of significant diagnostics from this hospitalization (including imaging, microbiology, ancillary  and laboratory) are listed below for reference.    Significant Diagnostic Studies: Ct Abdomen Pelvis W Contrast  03/21/2014   CLINICAL DATA:  Mid abdominal pain beginning last night, worsening since arrival in emergency department. History of constipation. History of hernia repair and reflux disease.  EXAM: CT ABDOMEN AND PELVIS WITH CONTRAST  TECHNIQUE: Multidetector CT imaging of the abdomen and pelvis was performed using the standard protocol following bolus administration of intravenous contrast.  CONTRAST:  159mL OMNIPAQUE IOHEXOL 300 MG/ML  SOLN  COMPARISON:  CT of the abdomen and pelvis May 21, 2009  FINDINGS: LUNG BASES: Included view of the lung bases demonstrates dependent atelectatic this Cough the heart appears mildly enlarged with coronary artery calcifications. No pericardial fluid collections.  SOLID ORGANS: The liver, spleen, gallbladder, pancreas and adrenal glands are unremarkable.  GASTROINTESTINAL TRACT: Multiple loops of dilated small bowel with inflammation though LEFT lower quadrant measure up to 4.1 cm. Multiple transition points and, suspected superimposed possible internal hernia. Large bowel is decompressed.  KIDNEYS/ URINARY TRACT: Kidneys are orthotopic, demonstrating symmetric enhancement. No nephrolithiasis, hydronephrosis or solid renal masses. Too small  to characterize hypodensities in the kidneys bilaterally. 1 cm exophytic LEFT lower pole cyst. The unopacified ureters are normal in course and caliber. Delayed imaging through the kidneys demonstrates symmetric prompt contrast excretion within the proximal urinary collecting system. Urinary bladder is partially distended and unremarkable.  PERITONEUM/RETROPERITONEUM: Aortoiliac vessels are normal in course, mild infrarenal aortic ectasia with moderate calcific atherosclerosis. Mildly ectatic RIGHT Common iliac artery. No lymphadenopathy by CT size criteria. Moderate prostatomegaly. Small amount of ascites in the LEFT abdomen.   SOFT TISSUE/OSSEOUS STRUCTURES: Non-suspicious. Extensive anterior abdominal wall scarring. Multiple small the moderate fat containing ventral hernias. Moderate to severe sacroiliac osteoarthrosis. Severe lower lumbar facet arthropathy, severe L3-4, L4-5 neural foraminal narrowing on the RIGHT.  IMPRESSION: Partial versus early bowel obstruction with multiple transition points. This may reflect adhesions though, suspect superimposed internal hernia. Small amount of ascites in the LEFT abdomen is likely reactive.  Extensive abdominal wall scarring with fat containing ventral hernias.   Electronically Signed   By: Elon Alas   On: 03/21/2014 05:17   Dg Abd Portable 1v  03/22/2014   CLINICAL DATA:  74 year old male with 4 days of constipation, abdominal pain and distention.  EXAM: PORTABLE ABDOMEN - 1 VIEW  COMPARISON:  CT abdomen/ pelvis 03/21/2014  FINDINGS: The tip of the nasogastric tube is in the descending duodenum. The bowel gas pattern is not obstructed. There is a small amount of gas in normal caliber small bowel in the lower abdomen. The colon has very little gas overall. Lower lumbar degenerative disc disease and facet arthropathy.  IMPRESSION: 1. The tip of the nasogastric tube is at the junction of the second and third portions of the duodenum. 2. Nonobstructed bowel gas pattern. Of note, obstructed, non aerated but fluid-filled loops of small bowel may be missed on conventional radiography.   Electronically Signed   By: Jacqulynn Cadet M.D.   On: 03/22/2014 14:25    Microbiology: Recent Results (from the past 240 hour(s))  Urine culture     Status: None   Collection Time: 03/22/14 10:54 AM  Result Value Ref Range Status   Specimen Description URINE, RANDOM  Final   Special Requests NONE  Final   Colony Count   Final    7,000 COLONIES/ML Performed at [facility]    Culture   Final    INSIGNIFICANT GROWTH Performed at [facility]    Report Status 03/24/2014  FINAL  Final     Labs: Basic Metabolic Panel:  Recent Labs Lab 03/21/14 0213 03/22/14 0813 03/23/14 0700 03/24/14 0752  NA 141 135 139 139  K 4.1 3.6 3.8 4.0  CL 106 102 106 105  CO2 30 25 29 26   GLUCOSE 109* 138* 93 84  BUN 12 10 10 14   CREATININE 0.94 0.86 0.88 0.94  CALCIUM 10.1 8.8 9.0 9.3   Liver Function Tests:  Recent Labs Lab 03/21/14 0213  AST 42*  ALT 29  ALKPHOS 49  BILITOT 0.7  PROT 6.8  ALBUMIN 4.1    Recent Labs Lab 03/21/14 0213  LIPASE 26   No results for input(s): AMMONIA in the last 168 hours. CBC:  Recent Labs Lab 03/21/14 0213 03/22/14 0813 03/23/14 0700 03/24/14 0752 03/25/14 0930  WBC 5.7 8.7 5.2 5.6 4.7  NEUTROABS 2.4  --   --   --   --   HGB 15.3 14.7 14.0 15.0 15.8  HCT 46.2 44.5 43.3 46.2 47.2  MCV 88.7 88.3 89.1 90.4 88.9  PLT 194  178 151 175 186   Cardiac Enzymes: No results for input(s): CKTOTAL, CKMB, CKMBINDEX, TROPONINI in the last 168 hours. BNP: BNP (last 3 results) No results for input(s): BNP in the last 8760 hours.  ProBNP (last 3 results) No results for input(s): PROBNP in the last 8760 hours.  CBG: No results for input(s): GLUCAP in the last 168 hours.     Signed:  Nemiah Kissner A  [facility] 03/25/2014, 12:07 PM

## 2014-03-27 NOTE — Care Management Note (Signed)
    Page 1 of 1   03/27/2014     4:17:42 PM CARE MANAGEMENT NOTE 03/27/2014  Patient:  William Norman, William Norman   Account Number:  [id number]  Date Initiated:  03/24/2014  Documentation initiated by:  Tomi Bamberger  Subjective/Objective Assessment:   dx sbo  admit- lives with spouse.     Action/Plan:   Anticipated DC Date:  03/25/2014   Anticipated DC Plan:  [place]  CM consult      Choice offered to / List presented to:             Status of service:  Completed, signed off Medicare Important Message given?  YES (If response is "NO", the following Medicare IM given date fields will be blank) Date Medicare IM given:  03/24/2014 Medicare IM given by:  Tomi Bamberger Date Additional Medicare IM given:   Additional Medicare IM given by:    Discharge Disposition:  HOME/SELF CARE  Per UR Regulation:  Reviewed for med. necessity/level of care/duration of stay  If discussed at [place] of Stay Meetings, dates discussed:    Comments:

## 2014-04-01 ENCOUNTER — Encounter: Payer: Self-pay | Admitting: Internal Medicine

## 2014-04-01 ENCOUNTER — Ambulatory Visit (INDEPENDENT_AMBULATORY_CARE_PROVIDER_SITE_OTHER): Payer: Medicare HMO | Admitting: Internal Medicine

## 2014-04-01 VITALS — BP 120/70 | HR 71 | Temp 98.0°F | Resp 20 | Ht 72.0 in | Wt 218.0 lb

## 2014-04-01 DIAGNOSIS — E785 Hyperlipidemia, unspecified: Secondary | ICD-10-CM

## 2014-04-01 DIAGNOSIS — I2510 Atherosclerotic heart disease of native coronary artery without angina pectoris: Secondary | ICD-10-CM

## 2014-04-01 DIAGNOSIS — I10 Essential (primary) hypertension: Secondary | ICD-10-CM

## 2014-04-01 NOTE — Progress Notes (Signed)
Subjective:    Patient ID: William Norman, male    DOB: 07/03/1940, 74 y.o.   MRN: [id number]  HPI  Admit date: 03/21/2014 Discharge date: 03/25/2014  Recommendations for Outpatient Follow-up:  1. Follow-up with primary care physician within one week. 2. Check INR within 1 week.  Discharge Diagnoses:  Principal Problem:  SBO (small bowel obstruction) Active Problems:  Essential hypertension  CAD (coronary artery disease), native coronary artery  Personal history of DVT (deep vein thrombosis)  Abdominal pain  AP (abdominal pain)  74 year old patient who has a history of complex diverticular disease.  He suffered a diverticular rupture and required a temporary colostomy and later re-anastomosis.  He was admitted hospital recently for a partial small bowel obstruction that was treated medically.  He has done quite well since his hospital discharge with no abdominal pain, nausea or other GI symptoms.  His bowel habits have been normal.  He generally feels very well and is back to his usual routine  He is scheduled for his annual physical in September He has resumed chronic anticoagulation  Hospital records including x-rays and lab reviewed  Past Medical History  Diagnosis Date  . CAD (coronary artery disease)   . Hyperlipidemia   . Hypertension   . BPH (benign prostatic hypertrophy)   . GERD (gastroesophageal reflux disease)   . Eczema   . DVT (deep venous thrombosis)     History   Social History  . Marital Status: Married    Spouse Name: N/A  . Number of Children: N/A  . Years of Education: N/A   Occupational History  . Not on file.   Social History Main Topics  . Smoking status: Former Smoker    Quit date: 03/25/1998  . Smokeless tobacco: Not on file  . Alcohol Use: 0.6 oz/week    1 Cans of beer per week  . Drug Use: No  . Sexual Activity: Not on file   Other Topics Concern  . Not on file   Social History Narrative    Past Surgical History   Procedure Laterality Date  . Precutaneous transluminal coronary angioplasty    . Tonsillectomy and adenoidectomy    . Right hernia    . Colon surgery  2010 and 2011 colostomy reversal  . Carpal tunnel release  04/21/2011    Procedure: CARPAL TUNNEL RELEASE;  Surgeon: Tennis Must, MD;  Location: [place];  Service: Orthopedics;  Laterality: Left;  . Carpal tunnel release  05/23/2011    Procedure: CARPAL TUNNEL RELEASE;  Surgeon: Tennis Must, MD;  Location: [place];  Service: Orthopedics;  Laterality: Right;    Family History  Problem Relation Age of Onset  . Heart disease Father     Allergies  Allergen Reactions  . Other Other (See Comments)    Some form of numbing medication used by dentist, almost died    Current Outpatient Prescriptions on File Prior to Visit  Medication Sig Dispense Refill  . atenolol (TENORMIN) 100 MG tablet TAKE 1 TABLET BY MOUTH EVERY DAY 100 tablet 3  . atorvastatin (LIPITOR) 40 MG tablet Take 1 tablet (40 mg total) by mouth daily. 90 tablet 3  . esomeprazole (NEXIUM) 40 MG capsule Take 1 capsule (40 mg total) by mouth daily before breakfast. (Patient taking differently: Take 40 mg by mouth daily as needed (acid reflux). ) 90 capsule 3  . hydroxypropyl methylcellulose / hypromellose (ISOPTO TEARS / GONIOVISC) 2.5 % ophthalmic solution Place 1  drop into both eyes 4 (four) times daily as needed for dry eyes.    Marland Kitchen lisinopril (PRINIVIL,ZESTRIL) 40 MG tablet Take 1 tablet (40 mg total) by mouth daily. 90 tablet 2  . niacin 50 MG tablet Take 50 mg by mouth daily.    . nitroGLYCERIN (NITROSTAT) 0.4 MG SL tablet Place 1 tablet (0.4 mg total) under the tongue every 5 (five) minutes as needed. 25 tablet 1  . OVER THE COUNTER MEDICATION Take 1 capsule by mouth daily.     Marland Kitchen oxymetazoline (AFRIN) 0.05 % nasal spray Place 2 sprays into both nostrils every evening.    . sildenafil (VIAGRA) 100 MG tablet Take 1 tablet (100 mg total) by  mouth daily as needed. (Patient taking differently: Take 100 mg by mouth daily as needed for erectile dysfunction. ) 6 tablet 5  . tamsulosin (FLOMAX) 0.4 MG CAPS capsule Take 0.4 mg by mouth daily.    Marland Kitchen warfarin (COUMADIN) 5 MG tablet Take as directed by anticoagulation clinic (Patient taking differently: Take 5 mg by mouth daily. ) 90 tablet 3   No current facility-administered medications on file prior to visit.    BP 120/70 mmHg  Pulse 71  Temp(Src) 98 F (36.7 C) (Oral)  Resp 20  Ht 6' (1.829 m)  Wt 218 lb (98.884 kg)  BMI 29.56 kg/m2  SpO2 97%     Review of Systems  Constitutional: Negative for fever, chills, appetite change and fatigue.  HENT: Negative for congestion, dental problem, ear pain, hearing loss, sore throat, tinnitus, trouble swallowing and voice change.   Eyes: Negative for pain, discharge and visual disturbance.  Respiratory: Negative for cough, chest tightness, wheezing and stridor.   Cardiovascular: Negative for chest pain, palpitations and leg swelling.  Gastrointestinal: Negative for nausea, vomiting, abdominal pain, diarrhea, constipation, blood in stool and abdominal distention.  Genitourinary: Negative for urgency, hematuria, flank pain, discharge, difficulty urinating and genital sores.  Musculoskeletal: Negative for myalgias, back pain, joint swelling, arthralgias, gait problem and neck stiffness.  Skin: Negative for rash.  Neurological: Negative for dizziness, syncope, speech difficulty, weakness, numbness and headaches.  Hematological: Negative for adenopathy. Does not bruise/bleed easily.  Psychiatric/Behavioral: Negative for behavioral problems and dysphoric mood. The patient is not nervous/anxious.        Objective:   Physical Exam  Constitutional: He is oriented to person, place, and time. He appears well-developed.  HENT:  Head: Normocephalic.  Right Ear: External ear normal.  Left Ear: External ear normal.  Eyes: Conjunctivae and EOM  are normal.  Neck: Normal range of motion.  Cardiovascular: Normal rate and normal heart sounds.   Pulmonary/Chest: Breath sounds normal.  Abdominal: Soft. Bowel sounds are normal. He exhibits no distension. There is no tenderness. There is no rebound and no guarding.  Well-healed surgical scars  Musculoskeletal: Normal range of motion. He exhibits no edema or tenderness.  Neurological: He is alert and oriented to person, place, and time.  Psychiatric: He has a normal mood and affect. His behavior is normal.          Assessment & Plan:   Status post partial SBO.  Treated medically.  Medically stable Hypertension Dyslipidemia Chronic Coumadin anticoagulation  CPX as scheduled Continue monthly INRs

## 2014-04-01 NOTE — Patient Instructions (Signed)
Call or return to clinic prn if these symptoms worsen or fail to improve as anticipated.  Annual exam as scheduled

## 2014-04-01 NOTE — Progress Notes (Signed)
Pre visit review using our clinic review tool, if applicable. No additional management support is needed unless otherwise documented below in the visit note. 

## 2014-04-02 ENCOUNTER — Telehealth: Payer: Self-pay | Admitting: Family Medicine

## 2014-04-02 NOTE — Telephone Encounter (Signed)
emmi emailed °

## 2014-04-14 ENCOUNTER — Encounter: Payer: Self-pay | Admitting: Family Medicine

## 2014-04-14 ENCOUNTER — Ambulatory Visit (INDEPENDENT_AMBULATORY_CARE_PROVIDER_SITE_OTHER): Payer: Medicare HMO | Admitting: Family Medicine

## 2014-04-14 ENCOUNTER — Ambulatory Visit (INDEPENDENT_AMBULATORY_CARE_PROVIDER_SITE_OTHER): Payer: Medicare HMO | Admitting: General Practice

## 2014-04-14 VITALS — BP 120/80 | HR 68 | Temp 98.3°F | Wt 217.5 lb

## 2014-04-14 DIAGNOSIS — Z5181 Encounter for therapeutic drug level monitoring: Secondary | ICD-10-CM

## 2014-04-14 DIAGNOSIS — Z86718 Personal history of other venous thrombosis and embolism: Secondary | ICD-10-CM

## 2014-04-14 DIAGNOSIS — L03012 Cellulitis of left finger: Secondary | ICD-10-CM

## 2014-04-14 LAB — POCT INR: INR: 2.1

## 2014-04-14 MED ORDER — DOXYCYCLINE HYCLATE 100 MG PO CAPS: 100.0000 mg | ORAL_CAPSULE | Freq: Two times a day (BID) | ORAL | Status: AC

## 2014-04-14 NOTE — Progress Notes (Signed)
   Subjective:    Patient ID: William Norman, male    DOB: Jan 27, 1941, 74 y.o.   MRN: [id number]  HPI Here for one week of a sore spot on the left 4th finger that will not heal despite dressing it with Neosporin and a Bandaid every day. He has eczema on the hands and sometimes the skin breaks down.    Review of Systems  Constitutional: Negative.   Skin: Positive for wound.       Objective:   Physical Exam  Constitutional: He appears well-developed and well-nourished.  Skin:  Both hands have patches of red macular scaly skin. The left 4th finger has an area of open skin with scant purulence draining out. This is tender but not swollen           Assessment & Plan:  Treat with Doxycycline.

## 2014-04-14 NOTE — Progress Notes (Signed)
Pre visit review using our clinic review tool, if applicable. No additional management support is needed unless otherwise documented below in the visit note. 

## 2014-05-22 ENCOUNTER — Other Ambulatory Visit: Payer: Self-pay | Admitting: Family Medicine

## 2014-05-26 ENCOUNTER — Ambulatory Visit (INDEPENDENT_AMBULATORY_CARE_PROVIDER_SITE_OTHER): Payer: Medicare HMO | Admitting: General Practice

## 2014-05-26 DIAGNOSIS — Z86718 Personal history of other venous thrombosis and embolism: Secondary | ICD-10-CM | POA: Diagnosis not present

## 2014-05-26 DIAGNOSIS — Z5181 Encounter for therapeutic drug level monitoring: Secondary | ICD-10-CM | POA: Diagnosis not present

## 2014-05-26 LAB — POCT INR: INR: 2.3

## 2014-05-26 NOTE — Progress Notes (Signed)
Pre visit review using our clinic review tool, if applicable. No additional management support is needed unless otherwise documented below in the visit note. 

## 2014-06-23 ENCOUNTER — Ambulatory Visit (INDEPENDENT_AMBULATORY_CARE_PROVIDER_SITE_OTHER): Payer: Medicare HMO | Admitting: General Practice

## 2014-06-23 DIAGNOSIS — Z86718 Personal history of other venous thrombosis and embolism: Secondary | ICD-10-CM

## 2014-06-23 DIAGNOSIS — Z5181 Encounter for therapeutic drug level monitoring: Secondary | ICD-10-CM | POA: Diagnosis not present

## 2014-06-23 LAB — POCT INR: INR: 1.9

## 2014-06-23 NOTE — Progress Notes (Signed)
Pre visit review using our clinic review tool, if applicable. No additional management support is needed unless otherwise documented below in the visit note. 

## 2014-08-04 ENCOUNTER — Ambulatory Visit (INDEPENDENT_AMBULATORY_CARE_PROVIDER_SITE_OTHER): Payer: Medicare HMO | Admitting: General Practice

## 2014-08-04 DIAGNOSIS — Z86718 Personal history of other venous thrombosis and embolism: Secondary | ICD-10-CM | POA: Diagnosis not present

## 2014-08-04 DIAGNOSIS — Z5181 Encounter for therapeutic drug level monitoring: Secondary | ICD-10-CM | POA: Diagnosis not present

## 2014-08-04 LAB — POCT INR: INR: 2.1

## 2014-08-04 NOTE — Progress Notes (Signed)
Pre visit review using our clinic review tool, if applicable. No additional management support is needed unless otherwise documented below in the visit note. 

## 2014-08-23 ENCOUNTER — Other Ambulatory Visit: Payer: Self-pay | Admitting: Family Medicine

## 2014-08-25 ENCOUNTER — Other Ambulatory Visit: Payer: Self-pay | Admitting: General Practice

## 2014-08-25 DIAGNOSIS — I82409 Acute embolism and thrombosis of unspecified deep veins of unspecified lower extremity: Secondary | ICD-10-CM

## 2014-08-25 MED ORDER — WARFARIN SODIUM 5 MG PO TABS: ORAL_TABLET | ORAL | Status: DC

## 2014-08-26 ENCOUNTER — Other Ambulatory Visit: Payer: Self-pay | Admitting: *Deleted

## 2014-08-26 MED ORDER — ATENOLOL 100 MG PO TABS: 100.0000 mg | ORAL_TABLET | Freq: Every day | ORAL | Status: DC

## 2014-08-26 NOTE — Telephone Encounter (Signed)
Patient came in to the office to request refills on Atenolol and Warfain.  William Norman informed the pt the Rx for Atenolol was sent by me and [name] sent a refill for the Warfarin on 7/11.

## 2014-09-15 ENCOUNTER — Ambulatory Visit: Payer: Medicare HMO

## 2014-09-18 ENCOUNTER — Ambulatory Visit (INDEPENDENT_AMBULATORY_CARE_PROVIDER_SITE_OTHER): Payer: Medicare HMO | Admitting: General Practice

## 2014-09-18 ENCOUNTER — Other Ambulatory Visit: Payer: Medicare HMO

## 2014-09-18 DIAGNOSIS — I82409 Acute embolism and thrombosis of unspecified deep veins of unspecified lower extremity: Secondary | ICD-10-CM | POA: Diagnosis not present

## 2014-09-18 DIAGNOSIS — Z5181 Encounter for therapeutic drug level monitoring: Secondary | ICD-10-CM

## 2014-09-18 LAB — POCT INR: INR: 1.7

## 2014-09-18 NOTE — Progress Notes (Signed)
Pre visit review using our clinic review tool, if applicable. No additional management support is needed unless otherwise documented below in the visit note. 

## 2014-10-14 ENCOUNTER — Other Ambulatory Visit (INDEPENDENT_AMBULATORY_CARE_PROVIDER_SITE_OTHER): Payer: Medicare HMO

## 2014-10-14 DIAGNOSIS — E785 Hyperlipidemia, unspecified: Secondary | ICD-10-CM | POA: Diagnosis not present

## 2014-10-14 DIAGNOSIS — N401 Enlarged prostate with lower urinary tract symptoms: Secondary | ICD-10-CM | POA: Diagnosis not present

## 2014-10-14 DIAGNOSIS — I10 Essential (primary) hypertension: Secondary | ICD-10-CM

## 2014-10-14 DIAGNOSIS — Z0000 Encounter for general adult medical examination without abnormal findings: Secondary | ICD-10-CM

## 2014-10-14 LAB — POCT URINALYSIS DIPSTICK
Bilirubin, UA: NEGATIVE
Glucose, UA: NEGATIVE
Ketones, UA: NEGATIVE
Leukocytes, UA: NEGATIVE
Nitrite, UA: NEGATIVE
Protein, UA: NEGATIVE
Spec Grav, UA: 1.025
Urobilinogen, UA: 0.2
pH, UA: 5.5

## 2014-10-14 LAB — HEPATIC FUNCTION PANEL
ALT: 24 U/L (ref 0–53)
AST: 28 U/L (ref 0–37)
Albumin: 4 g/dL (ref 3.5–5.2)
Alkaline Phosphatase: 46 U/L (ref 39–117)
Bilirubin, Direct: 0.1 mg/dL (ref 0.0–0.3)
Total Bilirubin: 0.6 mg/dL (ref 0.2–1.2)
Total Protein: 6.4 g/dL (ref 6.0–8.3)

## 2014-10-14 LAB — CBC WITH DIFFERENTIAL/PLATELET
Basophils Absolute: 0.1 10*3/uL (ref 0.0–0.1)
Basophils Relative: 1.1 % (ref 0.0–3.0)
Eosinophils Absolute: 0.1 10*3/uL (ref 0.0–0.7)
Eosinophils Relative: 1.5 % (ref 0.0–5.0)
HCT: 44.4 % (ref 39.0–52.0)
Hemoglobin: 14.8 g/dL (ref 13.0–17.0)
Lymphocytes Relative: 28.2 % (ref 12.0–46.0)
Lymphs Abs: 1.7 10*3/uL (ref 0.7–4.0)
MCHC: 33.2 g/dL (ref 30.0–36.0)
MCV: 88 fl (ref 78.0–100.0)
Monocytes Absolute: 0.7 10*3/uL (ref 0.1–1.0)
Monocytes Relative: 11.1 % (ref 3.0–12.0)
Neutro Abs: 3.4 10*3/uL (ref 1.4–7.7)
Neutrophils Relative %: 58.1 % (ref 43.0–77.0)
Platelets: 184 10*3/uL (ref 150.0–400.0)
RBC: 5.04 Mil/uL (ref 4.22–5.81)
RDW: 14.6 % (ref 11.5–15.5)
WBC: 5.9 10*3/uL (ref 4.0–10.5)

## 2014-10-14 LAB — PSA: PSA: 1.8 ng/mL (ref 0.10–4.00)

## 2014-10-14 LAB — LIPID PANEL
Cholesterol: 161 mg/dL (ref 0–200)
HDL: 45.6 mg/dL (ref >39.00)
LDL Cholesterol: 96 mg/dL (ref 0–99)
NonHDL: 115.45
Total CHOL/HDL Ratio: 4
Triglycerides: 96 mg/dL (ref 0.0–149.0)
VLDL: 19.2 mg/dL (ref 0.0–40.0)

## 2014-10-14 LAB — BASIC METABOLIC PANEL
BUN: 18 mg/dL (ref 6–23)
CO2: 29 mEq/L (ref 19–32)
Calcium: 9.8 mg/dL (ref 8.4–10.5)
Chloride: 106 mEq/L (ref 96–112)
Creatinine, Ser: 0.82 mg/dL (ref 0.40–1.50)
GFR: 97.4 mL/min (ref >60.00)
Glucose, Bld: 101 mg/dL — ABNORMAL HIGH (ref 70–99)
Potassium: 5 mEq/L (ref 3.5–5.1)
Sodium: 141 mEq/L (ref 135–145)

## 2014-10-14 LAB — TSH: TSH: 3.09 u[IU]/mL (ref 0.35–4.50)

## 2014-10-14 NOTE — Addendum Note (Signed)
Addended by: Elmer Picker on: 10/14/2014 09:34 AM   Modules accepted: Orders

## 2014-10-21 ENCOUNTER — Other Ambulatory Visit: Payer: Self-pay | Admitting: Family Medicine

## 2014-10-21 ENCOUNTER — Encounter: Payer: Managed Care, Other (non HMO) | Admitting: Family Medicine

## 2014-10-21 ENCOUNTER — Encounter: Payer: Self-pay | Admitting: Family Medicine

## 2014-10-21 ENCOUNTER — Ambulatory Visit (INDEPENDENT_AMBULATORY_CARE_PROVIDER_SITE_OTHER): Payer: Medicare HMO | Admitting: General Practice

## 2014-10-21 ENCOUNTER — Ambulatory Visit (INDEPENDENT_AMBULATORY_CARE_PROVIDER_SITE_OTHER): Payer: Medicare HMO | Admitting: Family Medicine

## 2014-10-21 VITALS — BP 140/90 | Temp 98.0°F | Ht 71.25 in | Wt 217.0 lb

## 2014-10-21 DIAGNOSIS — R351 Nocturia: Secondary | ICD-10-CM

## 2014-10-21 DIAGNOSIS — Z5181 Encounter for therapeutic drug level monitoring: Secondary | ICD-10-CM

## 2014-10-21 DIAGNOSIS — Z0000 Encounter for general adult medical examination without abnormal findings: Secondary | ICD-10-CM

## 2014-10-21 DIAGNOSIS — I10 Essential (primary) hypertension: Secondary | ICD-10-CM | POA: Diagnosis not present

## 2014-10-21 DIAGNOSIS — I2510 Atherosclerotic heart disease of native coronary artery without angina pectoris: Secondary | ICD-10-CM | POA: Diagnosis not present

## 2014-10-21 DIAGNOSIS — Z23 Encounter for immunization: Secondary | ICD-10-CM

## 2014-10-21 DIAGNOSIS — Z86718 Personal history of other venous thrombosis and embolism: Secondary | ICD-10-CM | POA: Diagnosis not present

## 2014-10-21 DIAGNOSIS — N401 Enlarged prostate with lower urinary tract symptoms: Secondary | ICD-10-CM

## 2014-10-21 DIAGNOSIS — E785 Hyperlipidemia, unspecified: Secondary | ICD-10-CM

## 2014-10-21 DIAGNOSIS — I2584 Coronary atherosclerosis due to calcified coronary lesion: Secondary | ICD-10-CM

## 2014-10-21 LAB — POCT INR: INR: 2.8

## 2014-10-21 MED ORDER — LISINOPRIL 40 MG PO TABS: 40.0000 mg | ORAL_TABLET | Freq: Every day | ORAL | Status: DC

## 2014-10-21 MED ORDER — ATENOLOL 100 MG PO TABS: 100.0000 mg | ORAL_TABLET | Freq: Every day | ORAL | Status: DC

## 2014-10-21 MED ORDER — NITROGLYCERIN 0.4 MG SL SUBL: 0.4000 mg | SUBLINGUAL_TABLET | SUBLINGUAL | Status: DC | PRN

## 2014-10-21 MED ORDER — ATORVASTATIN CALCIUM 40 MG PO TABS: 40.0000 mg | ORAL_TABLET | Freq: Every day | ORAL | Status: DC

## 2014-10-21 NOTE — Patient Instructions (Signed)
Continue current medications  Remember to avoid all caffeinated products  Follow-up in one year............ Beaulah Dinning our new adult Designer, jewellery from [employer]

## 2014-10-21 NOTE — Progress Notes (Signed)
Pre visit review using our clinic review tool, if applicable. No additional management support is needed unless otherwise documented below in the visit note. 

## 2014-10-21 NOTE — Progress Notes (Signed)
I have reviewed and agree with the plan. 

## 2014-10-21 NOTE — Progress Notes (Signed)
Subjective:    Patient ID: William Norman, male    DOB: [birth year], 74 y.o.   MRN: [id number]  HPI William Norman is a 74 year old married male nonsmoker who comes in today for general physical examination because of a history of hypertension, hyperlipidemia, coronary artery disease, chronic anticoagulation with Coumadin  His blood pressure is 140/90 on Tenormin 100 mg daily along with lisinopril 40 mg daily  He takes Lipitor 40 mg daily for hyperlipidemia lipids are at goal with an LDL of 96  He had a history coronary disease currently no chest pain or shortness of breath with exertion. He lives in [place] does all his own mowing without any difficulty.  At one time he took some Flomax for 6 months for BPH but didn't really help  He takes Coumadin 5 mg daily he gets his pro times here at [facility]  He gets routine eye care, dental care, colonoscopy 2010 was normal. Vaccinations updated today by Apolonio Schneiders  Cognitive function normal he walks on a daily basis home health safety reviewed no issues identified, he does have guns in the house but they're locked up, he also has a healthcare power of attorney and living well   Review of Systems  Constitutional: Negative.   HENT: Negative.   Eyes: Negative.   Respiratory: Negative.   Cardiovascular: Negative.   Gastrointestinal: Negative.   Endocrine: Negative.   Genitourinary: Negative.   Musculoskeletal: Negative.   Skin: Negative.   Allergic/Immunologic: Negative.   Neurological: Negative.   Hematological: Negative.   Psychiatric/Behavioral: Negative.        Objective:   Physical Exam  Constitutional: He is oriented to person, place, and time. He appears well-developed and well-nourished.  HENT:  Head: Normocephalic and atraumatic.  Right Ear: External ear normal.  Left Ear: External ear normal.  Nose: Nose normal.  Mouth/Throat: Oropharynx is clear and moist.  Upper fold denture partial lower  Eyes: Conjunctivae  and EOM are normal. Pupils are equal, round, and reactive to light.  Neck: Normal range of motion. Neck supple. No JVD present. No tracheal deviation present. No thyromegaly present.  Cardiovascular: Normal rate, regular rhythm, normal heart sounds and intact distal pulses.  Exam reveals no gallop and no friction rub.   No murmur heard. No carotid aortic bruits peripheral pulses 2+ and symmetrical  Pulmonary/Chest: Effort normal and breath sounds normal. No stridor. No respiratory distress. He has no wheezes. He has no rales. He exhibits no tenderness.  Abdominal: Soft. Bowel sounds are normal. He exhibits no distension and no mass. There is no tenderness. There is no rebound and no guarding.  Genitourinary: Rectum normal and penis normal. Guaiac negative stool. No penile tenderness.  3+ symmetrical nonnodular BPH  Musculoskeletal: Normal range of motion. He exhibits no edema or tenderness.  Lymphadenopathy:    He has no cervical adenopathy.  Neurological: He is alert and oriented to person, place, and time. He has normal reflexes. No cranial nerve deficit. He exhibits normal muscle tone.  Skin: Skin is warm and dry. No rash noted. No erythema. No pallor.  Psychiatric: He has a normal mood and affect. His behavior is normal. Judgment and thought content normal.  Nursing note and vitals reviewed.         Assessment & Plan:  Healthy male  Hypertension ago continue current therapy  Hyperlipidemia goal continue current therapy  History coronary disease refill Nitrostat  BPH 3+ relatively asymptomatic unresponsive to 6 months of Flomax. Discussed various options patient elects  to do nothing for now  Long-term anticoagulant follow-up in the Coumadin clinic.

## 2014-12-01 ENCOUNTER — Ambulatory Visit: Payer: Medicare HMO

## 2014-12-11 ENCOUNTER — Ambulatory Visit (INDEPENDENT_AMBULATORY_CARE_PROVIDER_SITE_OTHER): Payer: Medicare HMO | Admitting: General Practice

## 2014-12-11 DIAGNOSIS — Z5181 Encounter for therapeutic drug level monitoring: Secondary | ICD-10-CM

## 2014-12-11 DIAGNOSIS — Z86718 Personal history of other venous thrombosis and embolism: Secondary | ICD-10-CM

## 2014-12-11 LAB — POCT INR: INR: 2.5

## 2014-12-11 NOTE — Progress Notes (Signed)
Pre visit review using our clinic review tool, if applicable. No additional management support is needed unless otherwise documented below in the visit note. 

## 2014-12-11 NOTE — Progress Notes (Signed)
Ok with this plan 

## 2014-12-25 DIAGNOSIS — R69 Illness, unspecified: Secondary | ICD-10-CM | POA: Diagnosis not present

## 2015-01-15 ENCOUNTER — Ambulatory Visit (INDEPENDENT_AMBULATORY_CARE_PROVIDER_SITE_OTHER): Payer: Medicare HMO | Admitting: General Practice

## 2015-01-15 DIAGNOSIS — Z86718 Personal history of other venous thrombosis and embolism: Secondary | ICD-10-CM | POA: Diagnosis not present

## 2015-01-15 DIAGNOSIS — Z5181 Encounter for therapeutic drug level monitoring: Secondary | ICD-10-CM | POA: Diagnosis not present

## 2015-01-15 LAB — POCT INR: INR: 2.2

## 2015-01-15 NOTE — Progress Notes (Signed)
Pre visit review using our clinic review tool, if applicable. No additional management support is needed unless otherwise documented below in the visit note. 

## 2015-01-27 ENCOUNTER — Encounter: Payer: Self-pay | Admitting: Gastroenterology

## 2015-02-26 ENCOUNTER — Ambulatory Visit (INDEPENDENT_AMBULATORY_CARE_PROVIDER_SITE_OTHER): Payer: Medicare HMO | Admitting: General Practice

## 2015-02-26 DIAGNOSIS — Z5181 Encounter for therapeutic drug level monitoring: Secondary | ICD-10-CM

## 2015-02-26 DIAGNOSIS — Z86718 Personal history of other venous thrombosis and embolism: Secondary | ICD-10-CM | POA: Diagnosis not present

## 2015-02-26 LAB — POCT INR: INR: 2.7

## 2015-02-26 NOTE — Progress Notes (Signed)
Pre visit review using our clinic review tool, if applicable. No additional management support is needed unless otherwise documented below in the visit note. 

## 2015-04-06 ENCOUNTER — Ambulatory Visit: Payer: Medicare HMO

## 2015-05-14 ENCOUNTER — Telehealth: Payer: Self-pay | Admitting: Family Medicine

## 2015-05-14 NOTE — Telephone Encounter (Signed)
Ok with me 

## 2015-05-14 NOTE — Telephone Encounter (Signed)
Patient is requesting to transfer care to [facility].   Marya Amsler,  Is this ok with you Dr Sherren Mocha,  Is this ok with you

## 2015-05-16 NOTE — Telephone Encounter (Signed)
Okay with Dr Audrie Kuri 

## 2015-05-28 NOTE — Telephone Encounter (Signed)
Called left vm ..

## 2015-06-09 ENCOUNTER — Ambulatory Visit (INDEPENDENT_AMBULATORY_CARE_PROVIDER_SITE_OTHER): Payer: Medicare HMO

## 2015-06-09 VITALS — BP 140/90 | Ht 71.0 in | Wt 216.0 lb

## 2015-06-09 DIAGNOSIS — Z0000 Encounter for general adult medical examination without abnormal findings: Secondary | ICD-10-CM

## 2015-06-09 NOTE — Progress Notes (Addendum)
Subjective:   William Norman is a 75 y.o. male who presents for Medicare Annual/Subsequent preventive examination.  Review of Systems:  HRA assessment completed during visit; [place]  The Patient was informed that this wellness visit is to identify risk and educate on how to reduce risk for increase disease through lifestyle changes.   ROS deferred to CPE exam with physician / is transitioning care to Terri Piedra NP and apt made 5/4 of next week;   Family and medical hx given below;  Maternal grand mother had DM Paternal grandfather died of blood clot Father has issues blood clots   Has been getting calls from [facility] regarding doctor not being Par; decided to transition care to this practice.   Lifestyle review:  HTN; slightly elevated today; did have spaghetti last pm. Educated no sodium and BP .  Reviewed foods with sodium; CAD with PTCA 1992; attempted stent; angioplasty; (late 80's) no issues since Dyslipidemia; trig good HDL 40  DVT had this 2 to 3 times; 1st time post- surgery;   Tobacco HX; approx 03/1998 ( smoked since 75 yo) 60 pack years;  ETOH; Beer one time a week.   Medication review/ Adherence good; no issues   BMI: 30  Full dentures; partial; sees dentist periodically to check on a few lower teeth  Diet;  Had banana and grapefruit;  apple in the am when getting up Later am: [place]; scrambled eggs and sausage; [employer] and [employer] scrambled; toast; very crisp bacon Lunch skips Dinner; eat late; oven roasted [place]'; cheese; tomato; lettuce;  Does not eat vegetables;  Likes corn; green beans, mashed potatoes; small chopped steak;  Discussed getting more vegetables in diet and referred to INR nurse to discuss options and number of greens.   Exercise;  Has a garden; 82ft x 3ft grows tomatoes; okra; squash, zucchini; cucumbers; green beans and butter beans  Loves working outside; Heat with wood; cuts his own wood;   HOME SAFETY; one level with  basement; lives in basement but bedrooms on main level; could alter if they needed to.  Age in place? YES, the long term plan is to stay in home.  Removal of clutter clearing paths through the home,  Railing as needed for stairs; may consider renovating bathrooms  Falls Hx:  no falls in the last year; Does not climb often but does occasionally; Given information regarding fall risk   Bathroom safety; 2 bathrooms; one a tub and one is shower with a seat;  Wife is slow down steps  Warehouse manager; yes Smoke detectors yes Firearms safety reviewed and will keep in a safe place if these exist.  Driving accidents; no; Wears a seatbelt Advised to use sun protection; no; does wear a cap   Stressors (1-5) most days a 2  Depression: Denies feeling depressed or hopeless; voices pleasure in daily life Mood stable;   Cognitive; Presents with no issues;  Engaged in assessment Manages checkbook, medications; no failures of task Ad8 score reviewed for issues; score 0  . Issues making decisions: no . Less interest in hobbies / activities: no . Repeats questions, stories; family complaining: no . Trouble using ordinary gadgets; microwave; computer: no . Forgets the month or year: no . Mismanaging finances:no . Missing apt; no . Daily problems with thinking of memory: no   Fall assessment no Gait assessment appears normal;   Mobilization and Functional losses from last year to this year? none  Sleep pattern changes; can't not sleep; Hours of sleep  on average? 6 hours;  Does get up and go to bathroom; mentioned it x 1 to the doctor. Can discuss with Terri Piedra on his next visit;   Urinary or fecal incontinence reviewed; frequency up q 2 hours;  Difficult stream; hurts to go at times; to fup with Greg 05/04   Counseling Health Maintenance Colonoscopy; 12/2008; June 2016, his colon ruptured; eating pizza and had pain; Colonoscopy due to be repeated 12/2018    EKG:  03/22/2014  Hearing: 4000hz  both ears   Ophthalmology exam; eyes examined; Dr. Gershon Crane cataracts in both eyes Lens implant was unique; no glasses lens;  Had issue with one eye ; lasik surgery; left eye;  Has been within the last year;   Immunizations: up to date   Advanced Directive;  Asked about donations of body to [facility] at the time of his death; has a plan in place. Has discussed with his children.  Will take the time to write his wishes out in [place]; given cone form.    Health Recommendations and Referrals fup urinary issues  with Terri Piedra for recommendation or referral    Current Care Team reviewed and updated  Cardiac Risk Factors include: advanced age (>77men, >28 women);dyslipidemia;hypertension;smoking/ tobacco exposure     Objective:    Vitals: BP 140/90 mmHg  Ht 5\' 11"  (1.803 m)  Wt 216 lb (97.977 kg)  BMI 30.14 kg/m2  Body mass index is 30.14 kg/(m^2).  Tobacco History  Smoking status  . Former Smoker -- 1.00 packs/day for 60 years  . Types: Cigarettes  . Quit date: 03/25/1998  Smokeless tobacco  . Not on file     Counseling given: Not Answered   Past Medical History  Diagnosis Date  . CAD (coronary artery disease)   . Hyperlipidemia   . Hypertension   . BPH (benign prostatic hypertrophy)   . GERD (gastroesophageal reflux disease)   . Eczema   . DVT (deep venous thrombosis) [facility])    Past Surgical History  Procedure Laterality Date  . Precutaneous transluminal coronary angioplasty    . Tonsillectomy and adenoidectomy    . Right hernia    . Colon surgery  2010 and 2011 colostomy reversal  . Carpal tunnel release  04/21/2011    Procedure: CARPAL TUNNEL RELEASE;  Surgeon: Tennis Must, MD;  Location: [place] [place];  Service: Orthopedics;  Laterality: Left;  . Carpal tunnel release  05/23/2011    Procedure: CARPAL TUNNEL RELEASE;  Surgeon: Tennis Must, MD;  Location: [place];  Service: Orthopedics;   Laterality: Right;   Family History  Problem Relation Age of Onset  . Heart disease Father    History  Sexual Activity  . Sexual Activity: Not on file    Outpatient Encounter Prescriptions as of 06/09/2015  Medication Sig  . atenolol (TENORMIN) 100 MG tablet Take 1 tablet (100 mg total) by mouth daily.  Marland Kitchen atorvastatin (LIPITOR) 40 MG tablet Take 1 tablet (40 mg total) by mouth daily.  . Coenzyme Q10 (COQ10) 200 MG CAPS Take by mouth.  . fluocinonide cream (LIDEX) 0.05 %   . hydroxypropyl methylcellulose / hypromellose (ISOPTO TEARS / GONIOVISC) 2.5 % ophthalmic solution Place 1 drop into both eyes 4 (four) times daily as needed for dry eyes.  Marland Kitchen lisinopril (PRINIVIL,ZESTRIL) 40 MG tablet Take 1 tablet (40 mg total) by mouth daily.  . niacin 50 MG tablet Take 50 mg by mouth daily.  . nitroGLYCERIN (NITROSTAT) 0.4 MG SL  tablet Dissolve 1 tablet under the tongue every 5 minutes as needed  . warfarin (COUMADIN) 5 MG tablet Take as directed by anticoagulation clinic  . OVER THE COUNTER MEDICATION Take 1 capsule by mouth daily.   Marland Kitchen oxymetazoline (AFRIN) 0.05 % nasal spray Place 2 sprays into both nostrils every evening. Reported on 06/09/2015   No facility-administered encounter medications on file as of 06/09/2015.    Activities of Daily Living In your present state of health, do you have any difficulty performing the following activities: 06/09/2015  Hearing? N  Vision? N  Difficulty concentrating or making decisions? N  Walking or climbing stairs? N  Dressing or bathing? N  Doing errands, shopping? N  Preparing Food and eating ? N  Using the Toilet? N  In the past six months, have you accidently leaked urine? N  Do you have problems with loss of bowel control? N  Managing your Medications? N  Managing your Finances? N  Housekeeping or managing your Housekeeping? N    Patient Care Team: Dorena Cookey, MD as PCP - General Jacolyn Reedy, MD as Consulting Physician (Cardiology)    Assessment:    Exercise Activities and Dietary recommendations Current Exercise Habits: Home exercise routine, Time (Minutes): 60, Frequency (Times/Week): 4, Weekly Exercise (Minutes/Week): 240, Intensity: Moderate  Goals    . patient     Wants to continue to stay active to enjoy life       Fall Risk Fall Risk  06/09/2015 10/21/2014 07/15/2013  Falls in the past year? No No Yes   Depression Screen PHQ 2/9 Scores 06/09/2015 10/21/2014 07/15/2013  PHQ - 2 Score 0 0 0    Cognitive Testing MMSE - Mini Mental State Exam 06/09/2015  Not completed: (No Data)   Ad8 score is 0; mentally engaged and alert  Immunization History  Administered Date(s) Administered  . Influenza Split 11/16/2012  . Influenza Whole 02/14/2005, 11/20/2007, 11/14/2009  . Influenza, High Dose Seasonal PF 10/21/2014  . Influenza,inj,Quad PF,36+ Mos 10/10/2013  . Pneumococcal Conjugate-13 10/21/2014  . Pneumococcal Polysaccharide-23 02/15/2003, 02/18/2008  . Td 02/15/2004, 10/21/2014  . Zoster 03/09/2010   Screening Tests Health Maintenance  Topic Date Due  . INFLUENZA VACCINE  09/15/2015  . COLONOSCOPY  12/26/2018  . TETANUS/TDAP  10/20/2024  . ZOSTAVAX  Completed  . PNA vac Low Risk Adult  Completed      Plan:    During the course of the visit the patient was educated and counseled about the following appropriate screening and preventive services:   Vaccines to include Pneumoccal, Influenza, Hepatitis B, Td, Zostavax, HCV/ up to date  Electrocardiogram: 03/2014  Cardiovascular Disease/ deferred to cardiology  Colorectal cancer screening; 12/2018  Diabetes screening/ neg  Prostate Cancer Screening/ 07/2013 neg  Glaucoma screening; neg; Dr. Gershon Crane; lens implant; left eye still giving him some issues   Nutrition counseling; low sodium  Smoking cessation counseling; has 60 pack hx; stopped around 2000;   Patient Instructions (the written plan) was given to the patient.    Wynetta Fines,  RN  06/09/2015   Medical screening examination/treatment/procedure(s) were performed by non-physician practitioner and as supervising provider I was immediately available for consultation/collaboration. I agree with above. Mauricio Po, FNP

## 2015-06-09 NOTE — Patient Instructions (Addendum)
William Norman , Thank you for taking time to come for your Medicare Wellness Visit. I appreciate your ongoing commitment to your health goals. Please review the following plan we discussed and let me know if I can assist you in the future.   Will complete Advanced Directive wishes in writing  Will fup with Terri Piedra NP regarding frequent voiding     These are the goals we discussed: Goals    . patient     Wants to continue to stay active to enjoy life        This is a list of the screening recommended for you and due dates:  Health Maintenance  Topic Date Due  . Flu Shot  09/15/2015  . Colon Cancer Screening  12/26/2018  . Tetanus Vaccine  10/20/2024  . Shingles Vaccine  Completed  . Pneumonia vaccines  Completed     Fall Prevention in the Home  Falls can cause injuries. They can happen to people of all ages. There are many things you can do to make your home safe and to help prevent falls.  WHAT CAN I DO ON THE OUTSIDE OF MY HOME?  Regularly fix the edges of walkways and driveways and fix any cracks.  Remove anything that might make you trip as you walk through a door, such as a raised step or threshold.  Trim any bushes or trees on the path to your home.  Use bright outdoor lighting.  Clear any walking paths of anything that might make someone trip, such as rocks or tools.  Regularly check to see if handrails are loose or broken. Make sure that both sides of any steps have handrails.  Any raised decks and porches should have guardrails on the edges.  Have any leaves, snow, or ice cleared regularly.  Use sand or salt on walking paths during winter.  Clean up any spills in your garage right away. This includes oil or grease spills. WHAT CAN I DO IN THE BATHROOM?   Use night lights.  Install grab bars by the toilet and in the tub and shower. Do not use towel bars as grab bars.  Use non-skid mats or decals in the tub or shower.  If you need to sit down in  the shower, use a plastic, non-slip stool.  Keep the floor dry. Clean up any water that spills on the floor as soon as it happens.  Remove soap buildup in the tub or shower regularly.  Attach bath mats securely with double-sided non-slip rug tape.  Do not have throw rugs and other things on the floor that can make you trip. WHAT CAN I DO IN THE BEDROOM?  Use night lights.  Make sure that you have a light by your bed that is easy to reach.  Do not use any sheets or blankets that are too big for your bed. They should not hang down onto the floor.  Have a firm chair that has side arms. You can use this for support while you get dressed.  Do not have throw rugs and other things on the floor that can make you trip. WHAT CAN I DO IN THE KITCHEN?  Clean up any spills right away.  Avoid walking on wet floors.  Keep items that you use a lot in easy-to-reach places.  If you need to reach something above you, use a strong step stool that has a grab bar.  Keep electrical cords out of the way.  Do not use floor  polish or wax that makes floors slippery. If you must use wax, use non-skid floor wax.  Do not have throw rugs and other things on the floor that can make you trip. WHAT CAN I DO WITH MY STAIRS?  Do not leave any items on the stairs.  Make sure that there are handrails on both sides of the stairs and use them. Fix handrails that are broken or loose. Make sure that handrails are as long as the stairways.  Check any carpeting to make sure that it is firmly attached to the stairs. Fix any carpet that is loose or worn.  Avoid having throw rugs at the top or bottom of the stairs. If you do have throw rugs, attach them to the floor with carpet tape.  Make sure that you have a light switch at the top of the stairs and the bottom of the stairs. If you do not have them, ask someone to add them for you. WHAT ELSE CAN I DO TO HELP PREVENT FALLS?  Wear shoes that:  Do not have high  heels.  Have rubber bottoms.  Are comfortable and fit you well.  Are closed at the toe. Do not wear sandals.  If you use a stepladder:  Make sure that it is fully opened. Do not climb a closed stepladder.  Make sure that both sides of the stepladder are locked into place.  Ask someone to hold it for you, if possible.  Clearly mark and make sure that you can see:  Any grab bars or handrails.  First and last steps.  Where the edge of each step is.  Use tools that help you move around (mobility aids) if they are needed. These include:  Canes.  Walkers.  Scooters.  Crutches.  Turn on the lights when you go into a dark area. Replace any light bulbs as soon as they burn out.  Set up your furniture so you have a clear path. Avoid moving your furniture around.  If any of your floors are uneven, fix them.  If there are any pets around you, be aware of where they are.  Review your medicines with your doctor. Some medicines can make you feel dizzy. This can increase your chance of falling. Ask your doctor what other things that you can do to help prevent falls.   This information is not intended to replace advice given to you by your health care provider. Make sure you discuss any questions you have with your health care provider.   Document Released: 11/27/2008 Document Revised: 06/17/2014 Document Reviewed: 03/07/2014 Elsevier Interactive Patient Education 2016 [place] Maintenance, Male A healthy lifestyle and preventative care can promote health and wellness.  Maintain regular health, dental, and eye exams.  Eat a healthy diet. Foods like vegetables, fruits, whole grains, low-fat dairy products, and lean protein foods contain the nutrients you need and are low in calories. Decrease your intake of foods high in solid fats, added sugars, and salt. Get information about a proper diet from your health care provider, if necessary.  Regular physical exercise  is one of the most important things you can do for your health. Most adults should get at least 150 minutes of moderate-intensity exercise (any activity that increases your heart rate and causes you to sweat) each week. In addition, most adults need muscle-strengthening exercises on 2 or more days a week.   Maintain a healthy weight. The body mass index (BMI) is a screening tool to identify  possible weight problems. It provides an estimate of body fat based on height and weight. Your health care provider can find your BMI and can help you achieve or maintain a healthy weight. For males 20 years and older:  A BMI below 18.5 is considered underweight.  A BMI of 18.5 to 24.9 is normal.  A BMI of 25 to 29.9 is considered overweight.  A BMI of 30 and above is considered obese.  Maintain normal blood lipids and cholesterol by exercising and minimizing your intake of saturated fat. Eat a balanced diet with plenty of fruits and vegetables. Blood tests for lipids and cholesterol should begin at age 96 and be repeated every 5 years. If your lipid or cholesterol levels are high, you are over age 69, or you are at high risk for heart disease, you may need your cholesterol levels checked more frequently.Ongoing high lipid and cholesterol levels should be treated with medicines if diet and exercise are not working.  If you smoke, find out from your health care provider how to quit. If you do not use tobacco, do not start.  Lung cancer screening is recommended for adults aged 16-80 years who are at high risk for developing lung cancer because of a history of smoking. A yearly low-dose CT scan of the lungs is recommended for people who have at least a 30-pack-year history of smoking and are current smokers or have quit within the past 15 years. A pack year of smoking is smoking an average of 1 pack of cigarettes a day for 1 year (for example, a 30-pack-year history of smoking could mean smoking 1 pack a day for 30  years or 2 packs a day for 15 years). Yearly screening should continue until the smoker has stopped smoking for at least 15 years. Yearly screening should be stopped for people who develop a health problem that would prevent them from having lung cancer treatment.  If you choose to drink alcohol, do not have more than 2 drinks per day. One drink is considered to be 12 oz (360 mL) of beer, 5 oz (150 mL) of wine, or 1.5 oz (45 mL) of liquor.  Avoid the use of street drugs. Do not share needles with anyone. Ask for help if you need support or instructions about stopping the use of drugs.  High blood pressure causes heart disease and increases the risk of stroke. High blood pressure is more likely to develop in:  People who have blood pressure in the end of the normal range (100-139/85-89 mm Hg).  People who are overweight or obese.  People who are African American.  If you are 69-60 years of age, have your blood pressure checked every 3-5 years. If you are 68 years of age or older, have your blood pressure checked every year. You should have your blood pressure measured twice--once when you are at a hospital or clinic, and once when you are not at a hospital or clinic. Record the average of the two measurements. To check your blood pressure when you are not at a hospital or clinic, you can use:  An automated blood pressure machine at a pharmacy.  A home blood pressure monitor.  If you are 60-72 years old, ask your health care provider if you should take aspirin to prevent heart disease.  Diabetes screening involves taking a blood sample to check your fasting blood sugar level. This should be done once every 3 years after age 19 if you are at a  normal weight and without risk factors for diabetes. Testing should be considered at a younger age or be carried out more frequently if you are overweight and have at least 1 risk factor for diabetes.  Colorectal cancer can be detected and often prevented.  Most routine colorectal cancer screening begins at the age of 28 and continues through age 41. However, your health care provider may recommend screening at an earlier age if you have risk factors for colon cancer. On a yearly basis, your health care provider may provide home test kits to check for hidden blood in the stool. A small camera at the end of a tube may be used to directly examine the colon (sigmoidoscopy or colonoscopy) to detect the earliest forms of colorectal cancer. Talk to your health care provider about this at age 53 when routine screening begins. A direct exam of the colon should be repeated every 5-10 years through age 52, unless early forms of precancerous polyps or small growths are found.  People who are at an increased risk for hepatitis B should be screened for this virus. You are considered at high risk for hepatitis B if:  You were born in a country where hepatitis B occurs often. Talk with your health care provider about which countries are considered high risk.  Your parents were born in a high-risk country and you have not received a shot to protect against hepatitis B (hepatitis B vaccine).  You have HIV or AIDS.  You use needles to inject street drugs.  You live with, or have sex with, someone who has hepatitis B.  You are a man who has sex with other men (MSM).  You get hemodialysis treatment.  You take certain medicines for conditions like cancer, organ transplantation, and autoimmune conditions.  Hepatitis C blood testing is recommended for all people born from [birth year] through 1966 and any individual with known risk factors for hepatitis C.  Healthy men should no longer receive prostate-specific antigen (PSA) blood tests as part of routine cancer screening. Talk to your health care provider about prostate cancer screening.  Testicular cancer screening is not recommended for adolescents or adult males who have no symptoms. Screening includes self-exam, a health  care provider exam, and other screening tests. Consult with your health care provider about any symptoms you have or any concerns you have about testicular cancer.  Practice safe sex. Use condoms and avoid high-risk sexual practices to reduce the spread of sexually transmitted infections (STIs).  You should be screened for STIs, including gonorrhea and chlamydia if:  You are sexually active and are younger than 24 years.  You are older than 24 years, and your health care provider tells you that you are at risk for this type of infection.  Your sexual activity has changed since you were last screened, and you are at an increased risk for chlamydia or gonorrhea. Ask your health care provider if you are at risk.  If you are at risk of being infected with HIV, it is recommended that you take a prescription medicine daily to prevent HIV infection. This is called pre-exposure prophylaxis (PrEP). You are considered at risk if:  You are a man who has sex with other men (MSM).  You are a heterosexual man who is sexually active with multiple partners.  You take drugs by injection.  You are sexually active with a partner who has HIV.  Talk with your health care provider about whether you are at high risk of being  infected with HIV. If you choose to begin PrEP, you should first be tested for HIV. You should then be tested every 3 months for as long as you are taking PrEP.  Use sunscreen. Apply sunscreen liberally and repeatedly throughout the day. You should seek shade when your shadow is shorter than you. Protect yourself by wearing long sleeves, pants, a wide-brimmed hat, and sunglasses year round whenever you are outdoors.  Tell your health care provider of new moles or changes in moles, especially if there is a change in shape or color. Also, tell your health care provider if a mole is larger than the size of a pencil eraser.  A one-time screening for abdominal aortic aneurysm (AAA) and surgical  repair of large AAAs by ultrasound is recommended for men aged 18-75 years who are current or former smokers.  Stay current with your vaccines (immunizations).   This information is not intended to replace advice given to you by your health care provider. Make sure you discuss any questions you have with your health care provider.   Document Released: 07/30/2007 Document Revised: 02/21/2014 Document Reviewed: 06/28/2010 Elsevier Interactive Patient Education [employer].

## 2015-06-18 ENCOUNTER — Encounter: Payer: Self-pay | Admitting: Family

## 2015-06-18 ENCOUNTER — Ambulatory Visit (INDEPENDENT_AMBULATORY_CARE_PROVIDER_SITE_OTHER): Payer: Medicare HMO | Admitting: Family

## 2015-06-18 DIAGNOSIS — I10 Essential (primary) hypertension: Secondary | ICD-10-CM

## 2015-06-18 DIAGNOSIS — E663 Overweight: Secondary | ICD-10-CM | POA: Insufficient documentation

## 2015-06-18 DIAGNOSIS — I639 Cerebral infarction, unspecified: Secondary | ICD-10-CM | POA: Insufficient documentation

## 2015-06-18 DIAGNOSIS — Z0000 Encounter for general adult medical examination without abnormal findings: Secondary | ICD-10-CM | POA: Diagnosis not present

## 2015-06-18 HISTORY — DX: Overweight: E66.3

## 2015-06-18 MED ORDER — AMLODIPINE BESYLATE 5 MG PO TABS: 5.0000 mg | ORAL_TABLET | Freq: Every day | ORAL | Status: DC

## 2015-06-18 NOTE — Progress Notes (Addendum)
Subjective:    Patient ID: William Norman, male    DOB: [birth year], 75 y.o.   MRN: [id number]  Chief Complaint  Patient presents with  . CPE    not fasting    HPI:  William Norman is a 75 y.o. male who presents today for an annual wellness visit.   1) Health Maintenance -   Diet - Averaging about 1-2 meals per day consisting of fruits, vegetables, chicken, beef and pork; 1-2 cups of caffeine daily.  Exercise - Working out in the yard  2) Publishing rights manager / Immunizations:  Dental -- Appointment upcoming / up to date  Vision -- Appointment upcoming    Health Maintenance  Topic Date Due  . INFLUENZA VACCINE  09/15/2015  . COLONOSCOPY  12/26/2018  . TETANUS/TDAP  10/20/2024  . ZOSTAVAX  Completed  . PNA vac Low Risk Adult  Completed     Immunization History  Administered Date(s) Administered  . Influenza Split 11/16/2012  . Influenza Whole 02/14/2005, 11/20/2007, 11/14/2009  . Influenza, High Dose Seasonal PF 10/21/2014  . Influenza,inj,Quad PF,36+ Mos 10/10/2013  . Pneumococcal Conjugate-13 10/21/2014  . Pneumococcal Polysaccharide-23 02/15/2003, 02/18/2008  . Td 02/15/2004, 10/21/2014  . Zoster 03/09/2010    RISK FACTORS  Tobacco History  Smoking status  . Former Smoker -- 1.00 packs/day for 60 years  . Types: Cigarettes  . Quit date: 03/25/1998  Smokeless tobacco  . Never Used     Cardiac risk factors: advanced age (older than 43 for men, 59 for women), dyslipidemia, hypertension, male gender and obesity (BMI >= 30 kg/m2).  Depression Screen  Q1: Over the past two weeks, have you felt down, depressed or hopeless? No  Q2: Over the past two weeks, have you felt little interest or pleasure in doing things? No  Have you lost interest or pleasure in daily life? No  Do you often feel hopeless? No  Do you cry easily over simple problems? No  Activities of Daily Living In your present state of health, do you have any difficulty  performing the following activities?:  Driving? No Managing money?  No Feeding yourself? No Getting from bed to chair? No Climbing a flight of stairs? No Preparing food and eating?: No Bathing or showering? No Getting dressed: No Getting to the toilet? No Using the toilet: No Moving around from place to place: No In the past year have you fallen or had a near fall?:No   Home Safety Has smoke detector and wears seat belts. No firearms. No excess sun exposure. Are there smokers in your home (other than you)?  No Do you feel safe at home?  Yes  Hearing Difficulties: No Do you often ask people to speak up or repeat themselves? No Do you experience ringing or noises in your ears? No  Do you have difficulty understanding soft or whispered voices? No    Cognitive Testing  Alert? Yes   Normal Appearance? Yes  Oriented to person? Yes  Place? Yes   Time? Yes  Recall of three objects?  Yes  Can perform simple calculations? Yes  Displays appropriate judgment? Yes  Can read the correct time from a watch face? Yes  Do you feel that you have a problem with memory? No  Do you often misplace items? No   Advanced Directives have been discussed with the patient? Yes  Current Physicians/Providers and Suppliers  1. Terri Piedra, FNP - Internal Medicine 2. [name], RN - Coumadin Management 3. Landry Corporal,  MD - Cardiology 4. Rowan Blase, MD - Dermatology 5. Rutherford Guys, MD - Opthalmology   Indicate any recent Medical Services you may have received from other than [facility] providers in the past year (date may be approximate).  All answers were reviewed with the patient and necessary referrals were made:  Mauricio Po, [place]   06/18/2015    Allergies  Allergen Reactions  . Other Other (See Comments)    Some form of numbing medication used by dentist, almost died     Outpatient Prescriptions Prior to Visit  Medication Sig Dispense Refill  . atenolol (TENORMIN) 100 MG  tablet Take 1 tablet (100 mg total) by mouth daily. 90 tablet 3  . atorvastatin (LIPITOR) 40 MG tablet Take 1 tablet (40 mg total) by mouth daily. 90 tablet 3  . Coenzyme Q10 (COQ10) 200 MG CAPS Take by mouth.    . fluocinonide cream (LIDEX) 0.05 %   2  . hydroxypropyl methylcellulose / hypromellose (ISOPTO TEARS / GONIOVISC) 2.5 % ophthalmic solution Place 1 drop into both eyes 4 (four) times daily as needed for dry eyes.    Marland Kitchen lisinopril (PRINIVIL,ZESTRIL) 40 MG tablet Take 1 tablet (40 mg total) by mouth daily. 90 tablet 3  . niacin 50 MG tablet Take 50 mg by mouth daily.    . nitroGLYCERIN (NITROSTAT) 0.4 MG SL tablet Dissolve 1 tablet under the tongue every 5 minutes as needed 30 tablet 1  . oxymetazoline (AFRIN) 0.05 % nasal spray Place 2 sprays into both nostrils every evening. Reported on 06/09/2015    . warfarin (COUMADIN) 5 MG tablet Take as directed by anticoagulation clinic 90 tablet 3  . OVER THE COUNTER MEDICATION Take 1 capsule by mouth daily.      No facility-administered medications prior to visit.     Past Medical History  Diagnosis Date  . CAD (coronary artery disease)   . Hyperlipidemia   . Hypertension   . BPH (benign prostatic hypertrophy)   . GERD (gastroesophageal reflux disease)   . Eczema   . DVT (deep venous thrombosis) [facility])      Past Surgical History  Procedure Laterality Date  . Precutaneous transluminal coronary angioplasty    . Tonsillectomy and adenoidectomy    . Right hernia    . Colon surgery  2010 and 2011 colostomy reversal  . Carpal tunnel release  04/21/2011    Procedure: CARPAL TUNNEL RELEASE;  Surgeon: Tennis Must, MD;  Location: [place];  Service: Orthopedics;  Laterality: Left;  . Carpal tunnel release  05/23/2011    Procedure: CARPAL TUNNEL RELEASE;  Surgeon: Tennis Must, MD;  Location: [place];  Service: Orthopedics;  Laterality: Right;     Family History  Problem Relation Age of Onset  . Heart  disease Father      Social History   Social History  . Marital Status: Married    Spouse Name: N/A  . Number of Children: 3  . Years of Education: 16   Occupational History  . Retired    Social History Main Topics  . Smoking status: Former Smoker -- 1.00 packs/day for 60 years    Types: Cigarettes    Quit date: 03/25/1998  . Smokeless tobacco: Never Used  . Alcohol Use: 4.2 oz/week    7 Glasses of wine per week  . Drug Use: No  . Sexual Activity: Not on file   Other Topics Concern  . Not on file   Social  History Narrative   Fun: Working outside in yard, cutting wood    Denies abuse and feels safe at home.      Review of Systems  Constitutional: Denies fever, chills, fatigue, or significant weight gain/loss. HENT: Head: Denies headache or neck pain Ears: Denies changes in hearing, ringing in ears, earache, drainage Nose: Denies discharge, stuffiness, itching, nosebleed, sinus pain Throat: Denies sore throat, hoarseness, dry mouth, sores, thrush Eyes: Denies loss/changes in vision, pain, redness, blurry/double vision, flashing lights Cardiovascular: Denies chest pain/discomfort, tightness, palpitations, shortness of breath with activity, difficulty lying down, swelling, sudden awakening with shortness of breath Respiratory: Denies shortness of breath, cough, sputum production, wheezing Gastrointestinal: Denies dysphasia, heartburn, change in appetite, nausea, change in bowel habits, rectal bleeding, constipation, diarrhea, yellow skin or eyes Genitourinary: Denies frequency, urgency, burning/pain, blood in urine, incontinence, change in urinary strength. Musculoskeletal: Denies muscle/joint pain, stiffness, back pain, redness or swelling of joints, trauma Skin: Denies rashes, lumps, itching, dryness, color changes, or hair/nail changes Neurological: Denies dizziness, fainting, seizures, weakness, numbness, tingling, tremor Psychiatric - Denies nervousness, stress,  depression or memory loss Endocrine: Denies heat or cold intolerance, sweating, frequent urination, excessive thirst, changes in appetite Hematologic: Denies ease of bruising or bleeding    Objective:     BP 162/80 mmHg  Pulse 55  Temp(Src) 97.5 F (36.4 C) (Oral)  Resp 16  Ht 5' 11.25" (1.81 m)  Wt 214 lb (97.07 kg)  BMI 29.63 kg/m2  SpO2 95% Nursing note and vital signs reviewed.  Physical Exam  Constitutional: He is oriented to person, place, and time. He appears well-developed and well-nourished.  HENT:  Head: Normocephalic.  Right Ear: Hearing, tympanic membrane, external ear and ear canal normal.  Left Ear: Hearing, tympanic membrane, external ear and ear canal normal.  Nose: Nose normal.  Mouth/Throat: Uvula is midline, oropharynx is clear and moist and mucous membranes are normal.  Eyes: Conjunctivae and EOM are normal. Pupils are equal, round, and reactive to light.  Neck: Neck supple. No JVD present. No tracheal deviation present. No thyromegaly present.  Cardiovascular: Normal rate, regular rhythm, normal heart sounds and intact distal pulses.   Pulmonary/Chest: Effort normal and breath sounds normal.  Abdominal: Soft. Bowel sounds are normal. He exhibits no distension and no mass. There is no tenderness. There is no rebound and no guarding.  Musculoskeletal: Normal range of motion. He exhibits no edema or tenderness.  Lymphadenopathy:    He has no cervical adenopathy.  Neurological: He is alert and oriented to person, place, and time. He has normal reflexes. No cranial nerve deficit. He exhibits normal muscle tone. Coordination normal.  Skin: Skin is warm and dry.  Psychiatric: He has a normal mood and affect. His behavior is normal. Judgment and thought content normal.       Assessment & Plan:   During the course of the visit the patient was educated and counseled about appropriate screening and preventive services including:    Pneumococcal vaccine    Influenza vaccine  Colorectal cancer screening  Nutrition counseling   Diet review for nutrition referral? Yes ____  Not Indicated _X___   Patient Instructions (the written plan) was given to the patient.  Medicare Attestation I have personally reviewed: The patient's medical and social history Their use of alcohol, tobacco or illicit drugs Their current medications and supplements The patient's functional ability including ADLs,fall risks, home safety risks, cognitive, and hearing and visual impairment Diet and physical activities Evidence for depression or mood disorders  The patient's weight, height, BMI,  have been recorded in the chart.  I have made referrals, counseling, and provided education to the patient based on review of the above and I have provided the patient with a written personalized care plan for preventive services.     Mauricio Po, FNP   06/18/2015    Problem List Items Addressed This Visit      Cardiovascular and Mediastinum   Essential hypertension (Chronic)    Hypertension remains uncontrolled and above goal 150/90 with current regimen. Start amlodipine. Continue current dosage of lisinopril and atenolol. Encouraged to monitor blood pressure at home. Decrease sodium in diet. Follow-up in 3 weeks for nurse visit to check blood pressure.      Relevant Medications   amLODipine (NORVASC) 5 MG tablet     Other   Routine general medical examination at a health care facility    1) Anticipatory Guidance: Discussed importance of wearing a seatbelt while driving and not texting while driving; changing batteries in smoke detector at least once annually; wearing suntan lotion when outside; eating a balanced and moderate diet; getting physical activity at least 30 minutes per day.  2) Immunizations / Screenings / Labs:  All immunizations are up-to-date per recommendations. Due for a vision and dental screen both with appointment scheduled next couple weeks.  Obtain PSA for prostate cancer screening. All other screenings are up-to-date per recommendations. Obtain CBC, CMET, and Lipid profile.  Overall well exam with risk factors for cardiovascular disease including hypertension, dyslipidemia and overweight. Hypertension remains labile with current regimen. Start amlodipine. Encouraged to monitor blood pressure at home. Dyslipidemia adequate controlled with atorvastatin. Recommend weight loss of approximately 5-10% of current body weight through nutrition and physical activity.Recommend increasing physical activity to 30 minutes of moderate level activity daily. Encourage nutritional intake that focuses on nutrient dense foods and is moderate, varied, and balanced and is low in saturated fats and processed/sugary foods. Continue other healthy lifestyle behaviors and choices. Follow-up prevention exam in 1 year. Follow-up office visit for chronic conditions in 3 months. Nurse visit in 3 weeks for blood pressure check.      Relevant Orders   CBC   Comprehensive metabolic panel   Lipid panel   PSA   Overweight (BMI 25.0-29.9)    BMI of 29 indicating overweight. Recommend weight loss of approximately 5-10% of current body weight through nutritional intake and lifestyle changes.Recommend increasing physical activity to 30 minutes of moderate level activity daily. Encourage nutritional intake that focuses on nutrient dense foods and is moderate, varied, and balanced and is low in saturated fats and processed/sugary foods. Continue to monitor with weight checks at next office visit.

## 2015-06-18 NOTE — Assessment & Plan Note (Signed)
BMI of 29 indicating overweight. Recommend weight loss of approximately 5-10% of current body weight through nutritional intake and lifestyle changes.Recommend increasing physical activity to 30 minutes of moderate level activity daily. Encourage nutritional intake that focuses on nutrient dense foods and is moderate, varied, and balanced and is low in saturated fats and processed/sugary foods. Continue to monitor with weight checks at next office visit.

## 2015-06-18 NOTE — Progress Notes (Signed)
Pre visit review using our clinic review tool, if applicable. No additional management support is needed unless otherwise documented below in the visit note. 

## 2015-06-18 NOTE — Assessment & Plan Note (Signed)
1) Anticipatory Guidance: Discussed importance of wearing a seatbelt while driving and not texting while driving; changing batteries in smoke detector at least once annually; wearing suntan lotion when outside; eating a balanced and moderate diet; getting physical activity at least 30 minutes per day.  2) Immunizations / Screenings / Labs:  All immunizations are up-to-date per recommendations. Due for a vision and dental screen both with appointment scheduled next couple weeks. Obtain PSA for prostate cancer screening. All other screenings are up-to-date per recommendations. Obtain CBC, CMET, and Lipid profile.  Overall well exam with risk factors for cardiovascular disease including hypertension, dyslipidemia and overweight. Hypertension remains labile with current regimen. Start amlodipine. Encouraged to monitor blood pressure at home. Dyslipidemia adequate controlled with atorvastatin. Recommend weight loss of approximately 5-10% of current body weight through nutrition and physical activity.Recommend increasing physical activity to 30 minutes of moderate level activity daily. Encourage nutritional intake that focuses on nutrient dense foods and is moderate, varied, and balanced and is low in saturated fats and processed/sugary foods. Continue other healthy lifestyle behaviors and choices. Follow-up prevention exam in 1 year. Follow-up office visit for chronic conditions in 3 months. Nurse visit in 3 weeks for blood pressure check.

## 2015-06-18 NOTE — Addendum Note (Signed)
Addended by: Mauricio Po D on: 06/18/2015 01:15 PM   Modules accepted: Level of Service

## 2015-06-18 NOTE — Assessment & Plan Note (Signed)
Hypertension remains uncontrolled and above goal 150/90 with current regimen. Start amlodipine. Continue current dosage of lisinopril and atenolol. Encouraged to monitor blood pressure at home. Decrease sodium in diet. Follow-up in 3 weeks for nurse visit to check blood pressure.

## 2015-06-18 NOTE — Patient Instructions (Addendum)
Thank you for choosing [facility].  Summary/Instructions:  Please continue to take your medications as prescribed.   Start the amlodipine for your blood pressure.   Follow up 3 weeks for blood pressure check and blood work.   Your prescription(s) have been submitted to your pharmacy or been printed and provided for you. Please take as directed and contact our office if you believe you are having problem(s) with the medication(s) or have any questions.  Health Maintenance  Topic Date Due  . INFLUENZA VACCINE  09/15/2015  . COLONOSCOPY  12/26/2018  . TETANUS/TDAP  10/20/2024  . ZOSTAVAX  Completed  . PNA vac Low Risk Adult  Completed   Health Maintenance, Male A healthy lifestyle and preventative care can promote health and wellness.  Maintain regular health, dental, and eye exams.  Eat a healthy diet. Foods like vegetables, fruits, whole grains, low-fat dairy products, and lean protein foods contain the nutrients you need and are low in calories. Decrease your intake of foods high in solid fats, added sugars, and salt. Get information about a proper diet from your health care provider, if necessary.  Regular physical exercise is one of the most important things you can do for your health. Most adults should get at least 150 minutes of moderate-intensity exercise (any activity that increases your heart rate and causes you to sweat) each week. In addition, most adults need muscle-strengthening exercises on 2 or more days a week.   Maintain a healthy weight. The body mass index (BMI) is a screening tool to identify possible weight problems. It provides an estimate of body fat based on height and weight. Your health care provider can find your BMI and can help you achieve or maintain a healthy weight. For males 20 years and older:  A BMI below 18.5 is considered underweight.  A BMI of 18.5 to 24.9 is normal.  A BMI of 25 to 29.9 is considered overweight.  A BMI of 30 and  above is considered obese.  Maintain normal blood lipids and cholesterol by exercising and minimizing your intake of saturated fat. Eat a balanced diet with plenty of fruits and vegetables. Blood tests for lipids and cholesterol should begin at age 34 and be repeated every 5 years. If your lipid or cholesterol levels are high, you are over age 56, or you are at high risk for heart disease, you may need your cholesterol levels checked more frequently.Ongoing high lipid and cholesterol levels should be treated with medicines if diet and exercise are not working.  If you smoke, find out from your health care provider how to quit. If you do not use tobacco, do not start.  Lung cancer screening is recommended for adults aged 10-80 years who are at high risk for developing lung cancer because of a history of smoking. A yearly low-dose CT scan of the lungs is recommended for people who have at least a 30-pack-year history of smoking and are current smokers or have quit within the past 15 years. A pack year of smoking is smoking an average of 1 pack of cigarettes a day for 1 year (for example, a 30-pack-year history of smoking could mean smoking 1 pack a day for 30 years or 2 packs a day for 15 years). Yearly screening should continue until the smoker has stopped smoking for at least 15 years. Yearly screening should be stopped for people who develop a health problem that would prevent them from having lung cancer treatment.  If you choose  to drink alcohol, do not have more than 2 drinks per day. One drink is considered to be 12 oz (360 mL) of beer, 5 oz (150 mL) of wine, or 1.5 oz (45 mL) of liquor.  Avoid the use of street drugs. Do not share needles with anyone. Ask for help if you need support or instructions about stopping the use of drugs.  High blood pressure causes heart disease and increases the risk of stroke. High blood pressure is more likely to develop in:  People who have blood pressure in the  end of the normal range (100-139/85-89 mm Hg).  People who are overweight or obese.  People who are African American.  If you are 41-58 years of age, have your blood pressure checked every 3-5 years. If you are 42 years of age or older, have your blood pressure checked every year. You should have your blood pressure measured twice--once when you are at a hospital or clinic, and once when you are not at a hospital or clinic. Record the average of the two measurements. To check your blood pressure when you are not at a hospital or clinic, you can use:  An automated blood pressure machine at a pharmacy.  A home blood pressure monitor.  If you are 33-59 years old, ask your health care provider if you should take aspirin to prevent heart disease.  Diabetes screening involves taking a blood sample to check your fasting blood sugar level. This should be done once every 3 years after age 27 if you are at a normal weight and without risk factors for diabetes. Testing should be considered at a younger age or be carried out more frequently if you are overweight and have at least 1 risk factor for diabetes.  Colorectal cancer can be detected and often prevented. Most routine colorectal cancer screening begins at the age of 68 and continues through age 53. However, your health care provider may recommend screening at an earlier age if you have risk factors for colon cancer. On a yearly basis, your health care provider may provide home test kits to check for hidden blood in the stool. A small camera at the end of a tube may be used to directly examine the colon (sigmoidoscopy or colonoscopy) to detect the earliest forms of colorectal cancer. Talk to your health care provider about this at age 83 when routine screening begins. A direct exam of the colon should be repeated every 5-10 years through age 87, unless early forms of precancerous polyps or small growths are found.  People who are at an increased risk for  hepatitis B should be screened for this virus. You are considered at high risk for hepatitis B if:  You were born in a country where hepatitis B occurs often. Talk with your health care provider about which countries are considered high risk.  Your parents were born in a high-risk country and you have not received a shot to protect against hepatitis B (hepatitis B vaccine).  You have HIV or AIDS.  You use needles to inject street drugs.  You live with, or have sex with, someone who has hepatitis B.  You are a man who has sex with other men (MSM).  You get hemodialysis treatment.  You take certain medicines for conditions like cancer, organ transplantation, and autoimmune conditions.  Hepatitis C blood testing is recommended for all people born from [birth year] through 1966 and any individual with known risk factors for hepatitis C.  Healthy men  should no longer receive prostate-specific antigen (PSA) blood tests as part of routine cancer screening. Talk to your health care provider about prostate cancer screening.  Testicular cancer screening is not recommended for adolescents or adult males who have no symptoms. Screening includes self-exam, a health care provider exam, and other screening tests. Consult with your health care provider about any symptoms you have or any concerns you have about testicular cancer.  Practice safe sex. Use condoms and avoid high-risk sexual practices to reduce the spread of sexually transmitted infections (STIs).  You should be screened for STIs, including gonorrhea and chlamydia if:  You are sexually active and are younger than 24 years.  You are older than 24 years, and your health care provider tells you that you are at risk for this type of infection.  Your sexual activity has changed since you were last screened, and you are at an increased risk for chlamydia or gonorrhea. Ask your health care provider if you are at risk.  If you are at risk of being  infected with HIV, it is recommended that you take a prescription medicine daily to prevent HIV infection. This is called pre-exposure prophylaxis (PrEP). You are considered at risk if:  You are a man who has sex with other men (MSM).  You are a heterosexual man who is sexually active with multiple partners.  You take drugs by injection.  You are sexually active with a partner who has HIV.  Talk with your health care provider about whether you are at high risk of being infected with HIV. If you choose to begin PrEP, you should first be tested for HIV. You should then be tested every 3 months for as long as you are taking PrEP.  Use sunscreen. Apply sunscreen liberally and repeatedly throughout the day. You should seek shade when your shadow is shorter than you. Protect yourself by wearing long sleeves, pants, a wide-brimmed hat, and sunglasses year round whenever you are outdoors.  Tell your health care provider of new moles or changes in moles, especially if there is a change in shape or color. Also, tell your health care provider if a mole is larger than the size of a pencil eraser.  A one-time screening for abdominal aortic aneurysm (AAA) and surgical repair of large AAAs by ultrasound is recommended for men aged 9-75 years who are current or former smokers.  Stay current with your vaccines (immunizations).   This information is not intended to replace advice given to you by your health care provider. Make sure you discuss any questions you have with your health care provider.   Document Released: 07/30/2007 Document Revised: 02/21/2014 Document Reviewed: 06/28/2010 Elsevier Interactive Patient Education [employer].

## 2015-06-18 NOTE — Assessment & Plan Note (Signed)
Reviewed and updated patient's medical, surgical, family and social history. Medications and allergies were also reviewed. Basic screenings for depression, activities of daily living, hearing, cognition and safety were performed. Provider list was updated and health plan was provided to the patient.  

## 2015-06-23 ENCOUNTER — Telehealth: Payer: Self-pay

## 2015-06-23 NOTE — Telephone Encounter (Signed)
Faxed PAF and OV notes to [facility] 2017

## 2015-06-30 ENCOUNTER — Telehealth: Payer: Self-pay

## 2015-06-30 NOTE — Telephone Encounter (Signed)
The patient returned my call to fup on information rec'd from the pastoral dept related to his wish to send his body for gifted to [facility] at his death. Stated that per the pastoral dept at [facility], there are qualifiers as he would have to make a reservation for [facility] to accept his body as anatomical gift/ they will then let him know how to manage things at that time. He will need information on the Qualifiers as well. William Norman stated he spoke to a person at [facility] and they have sent him paper work but he actually has not reviewed this. Understands that he needs to review and complete, as well fup his direction on his HCPOA. He may have to write in that the [place] has the "right to decide what will happen upon his death" and then state his wishes; The patient will review the information sent from [facility] and completed the needed paper work.  Will call as needed.

## 2015-07-07 DIAGNOSIS — H43813 Vitreous degeneration, bilateral: Secondary | ICD-10-CM | POA: Diagnosis not present

## 2015-07-07 DIAGNOSIS — H35431 Paving stone degeneration of retina, right eye: Secondary | ICD-10-CM | POA: Diagnosis not present

## 2015-07-07 DIAGNOSIS — H04123 Dry eye syndrome of bilateral lacrimal glands: Secondary | ICD-10-CM | POA: Diagnosis not present

## 2015-07-08 ENCOUNTER — Other Ambulatory Visit (INDEPENDENT_AMBULATORY_CARE_PROVIDER_SITE_OTHER): Payer: Medicare HMO

## 2015-07-08 DIAGNOSIS — Z0000 Encounter for general adult medical examination without abnormal findings: Secondary | ICD-10-CM | POA: Diagnosis not present

## 2015-07-08 DIAGNOSIS — Z125 Encounter for screening for malignant neoplasm of prostate: Secondary | ICD-10-CM | POA: Diagnosis not present

## 2015-07-08 LAB — COMPREHENSIVE METABOLIC PANEL
ALT: 24 U/L (ref 0–53)
AST: 27 U/L (ref 0–37)
Albumin: 4.2 g/dL (ref 3.5–5.2)
Alkaline Phosphatase: 45 U/L (ref 39–117)
BUN: 16 mg/dL (ref 6–23)
CO2: 29 mEq/L (ref 19–32)
Calcium: 9.7 mg/dL (ref 8.4–10.5)
Chloride: 107 mEq/L (ref 96–112)
Creatinine, Ser: 0.83 mg/dL (ref 0.40–1.50)
GFR: 95.86 mL/min (ref >60.00)
Glucose, Bld: 79 mg/dL (ref 70–99)
Potassium: 4.2 mEq/L (ref 3.5–5.1)
Sodium: 142 mEq/L (ref 135–145)
Total Bilirubin: 0.6 mg/dL (ref 0.2–1.2)
Total Protein: 6.3 g/dL (ref 6.0–8.3)

## 2015-07-08 LAB — LIPID PANEL
Cholesterol: 155 mg/dL (ref 0–200)
HDL: 46.4 mg/dL (ref >39.00)
LDL Cholesterol: 80 mg/dL (ref 0–99)
NonHDL: 108.97
Total CHOL/HDL Ratio: 3
Triglycerides: 146 mg/dL (ref 0.0–149.0)
VLDL: 29.2 mg/dL (ref 0.0–40.0)

## 2015-07-08 LAB — CBC
HCT: 45.9 % (ref 39.0–52.0)
Hemoglobin: 15 g/dL (ref 13.0–17.0)
MCHC: 32.8 g/dL (ref 30.0–36.0)
MCV: 87.9 fl (ref 78.0–100.0)
Platelets: 213 10*3/uL (ref 150.0–400.0)
RBC: 5.22 Mil/uL (ref 4.22–5.81)
RDW: 14.8 % (ref 11.5–15.5)
WBC: 7.5 10*3/uL (ref 4.0–10.5)

## 2015-07-08 LAB — PSA: PSA: 1.65 ng/mL (ref 0.10–4.00)

## 2015-07-09 ENCOUNTER — Encounter: Payer: Self-pay | Admitting: Family

## 2015-07-17 ENCOUNTER — Ambulatory Visit (INDEPENDENT_AMBULATORY_CARE_PROVIDER_SITE_OTHER): Payer: Medicare HMO | Admitting: General Practice

## 2015-07-17 DIAGNOSIS — Z5181 Encounter for therapeutic drug level monitoring: Secondary | ICD-10-CM

## 2015-07-17 LAB — POCT INR: INR: 1.9

## 2015-07-17 NOTE — Progress Notes (Signed)
Pre visit review using our clinic review tool, if applicable. No additional management support is needed unless otherwise documented below in the visit note. 

## 2015-07-17 NOTE — Progress Notes (Signed)
I have reviewed and agree with the plan. 

## 2015-08-14 ENCOUNTER — Ambulatory Visit (INDEPENDENT_AMBULATORY_CARE_PROVIDER_SITE_OTHER): Payer: Medicare HMO | Admitting: General Practice

## 2015-08-14 DIAGNOSIS — Z5181 Encounter for therapeutic drug level monitoring: Secondary | ICD-10-CM

## 2015-08-14 LAB — POCT INR: INR: 2.8

## 2015-08-14 NOTE — Progress Notes (Signed)
Pre visit review using our clinic review tool, if applicable. No additional management support is needed unless otherwise documented below in the visit note. 

## 2015-08-14 NOTE — Progress Notes (Signed)
I have reviewed and agree with the plan. 

## 2015-08-21 ENCOUNTER — Other Ambulatory Visit: Payer: Self-pay | Admitting: Family

## 2015-08-27 ENCOUNTER — Ambulatory Visit (INDEPENDENT_AMBULATORY_CARE_PROVIDER_SITE_OTHER): Payer: Medicare HMO | Admitting: Family

## 2015-08-27 ENCOUNTER — Encounter: Payer: Self-pay | Admitting: Family

## 2015-08-27 VITALS — BP 142/80 | HR 59 | Temp 98.0°F | Resp 14 | Ht 71.25 in | Wt 214.2 lb

## 2015-08-27 DIAGNOSIS — M7989 Other specified soft tissue disorders: Secondary | ICD-10-CM

## 2015-08-27 DIAGNOSIS — M799 Soft tissue disorder, unspecified: Secondary | ICD-10-CM

## 2015-08-27 HISTORY — DX: Soft tissue disorder, unspecified: M79.9

## 2015-08-27 MED ORDER — DOXYCYCLINE HYCLATE 100 MG PO TABS: 100.0000 mg | ORAL_TABLET | Freq: Two times a day (BID) | ORAL | Status: DC

## 2015-08-27 NOTE — Patient Instructions (Addendum)
Thank you for choosing [facility].  Summary/Instructions:  Please keep clean with soap and water.   Start the doxycycline.  If symptoms do not improve please let us know.  Your prescription(s) have been submitted to your pharmacy or been printed and provided for you. Please take as directed and contact our office if you believe you are having problem(s) with the medication(s) or have any questions.  If your symptoms worsen or fail to improve, please contact our office for further instruction, or in case of emergency go directly to the emergency room at the closest medical facility.

## 2015-08-27 NOTE — Progress Notes (Signed)
Subjective:    Patient ID: William Norman, male    DOB: [birth year], 75 y.o.   MRN: [id number]  Chief Complaint  Patient presents with  . Mass    x1 month has had a growth on left foot, it does have pain with it    HPI:  William Norman is a 75 y.o. male who  has a past medical history of CAD (coronary artery disease); Hyperlipidemia; Hypertension; BPH (benign prostatic hypertrophy); GERD (gastroesophageal reflux disease); Eczema; and DVT (deep venous thrombosis) ([place]). and presents today for an office visit.   This is a new problem. Associated symptom of a mass located on his left foot has been going on for about 1 month. Described as a red nodule and tender to the touch. Modifying factors include neosporin and a bandage which has not helped. Course of the symptoms has not changed. Denies fevers.    Allergies  Allergen Reactions  . Other Other (See Comments)    Some form of numbing medication used by dentist, almost died     Current Outpatient Prescriptions on File Prior to Visit  Medication Sig Dispense Refill  . amLODipine (NORVASC) 5 MG tablet Take 1 tablet (5 mg total) by mouth daily. 30 tablet 2  . atenolol (TENORMIN) 100 MG tablet Take 1 tablet (100 mg total) by mouth daily. 90 tablet 3  . atorvastatin (LIPITOR) 40 MG tablet Take 1 tablet (40 mg total) by mouth daily. 90 tablet 3  . Coenzyme Q10 (COQ10) 200 MG CAPS Take by mouth.    . fluocinonide cream (LIDEX) 0.05 %   2  . hydroxypropyl methylcellulose / hypromellose (ISOPTO TEARS / GONIOVISC) 2.5 % ophthalmic solution Place 1 drop into both eyes 4 (four) times daily as needed for dry eyes.    Marland Kitchen lisinopril (PRINIVIL,ZESTRIL) 40 MG tablet Take 1 tablet (40 mg total) by mouth daily. 90 tablet 3  . niacin 50 MG tablet Take 50 mg by mouth daily.    . nitroGLYCERIN (NITROSTAT) 0.4 MG SL tablet Dissolve 1 tablet under the tongue every 5 minutes as needed 30 tablet 1  . oxymetazoline (AFRIN) 0.05 % nasal spray  Place 2 sprays into both nostrils every evening. Reported on 06/09/2015    . warfarin (COUMADIN) 5 MG tablet TAKE AS DIRECTED BY ANTICOAGULATION CLINIC 90 tablet 0   No current facility-administered medications on file prior to visit.    Past Medical History  Diagnosis Date  . CAD (coronary artery disease)   . Hyperlipidemia   . Hypertension   . BPH (benign prostatic hypertrophy)   . GERD (gastroesophageal reflux disease)   . Eczema   . DVT (deep venous thrombosis) ([place])       Review of Systems  Constitutional: Negative for fever and chills.  Skin:       Positive for a lesion located on his left foot.       Objective:    BP 142/80 mmHg  Pulse 59  Temp(Src) 98 F (36.7 C) (Oral)  Resp 14  Ht 5' 11.25" (1.81 m)  Wt 214 lb 3.2 oz (97.16 kg)  BMI 29.66 kg/m2  SpO2 98% Nursing note and vital signs reviewed.  Physical Exam  Constitutional: He is oriented to person, place, and time. He appears well-developed and well-nourished. No distress.  Cardiovascular: Normal rate, regular rhythm, normal heart sounds and intact distal pulses.   Pulmonary/Chest: Effort normal and breath sounds normal.  Neurological: He is alert and oriented to person, place, and  time.  Skin: Skin is warm and dry.  Approximately 1 in irregularly shaped raised lesion that appears red and purple with a reddish border surrounding the base and mild warmth. The tip appears with dried discharge. There is tenderness to the touch.   Psychiatric: He has a normal mood and affect. His behavior is normal. Judgment and thought content normal.       Assessment & Plan:   Problem List Items Addressed This Visit      Other   Soft tissue lesion of foot - Primary    Symptoms and exam consistent with a soft tissue leision/cyst. No evidence of systemic infection with vital signs being normal. Start doxycycline. Does have potential for worsening. Instructed to monitor for signs of bleeding as INR may be increased  secondary to doxycyline. Follow up if symptoms worsen or do not improve.       Relevant Medications   doxycycline (VIBRA-TABS) 100 MG tablet       I am having Mr. Tousignant start on doxycycline. I am also having him maintain his niacin, oxymetazoline, hydroxypropyl methylcellulose / hypromellose, fluocinonide cream, lisinopril, atorvastatin, atenolol, nitroGLYCERIN, CoQ10, amLODipine, and warfarin.   Meds ordered this encounter  Medications  . doxycycline (VIBRA-TABS) 100 MG tablet    Sig: Take 1 tablet (100 mg total) by mouth 2 (two) times daily.    Dispense:  20 tablet    Refill:  0    Order Specific Question:  Supervising Provider    Answer:  Pricilla Holm A [id number]     Follow-up: Return if symptoms worsen or fail to improve.  Mauricio Po, FNP

## 2015-08-27 NOTE — Assessment & Plan Note (Addendum)
Symptoms and exam consistent with a soft tissue leision/cyst. No evidence of systemic infection with vital signs being normal. Start doxycycline. Does have potential for worsening. Instructed to monitor for signs of bleeding as INR may be increased secondary to doxycyline. Follow up if symptoms worsen or do not improve.

## 2015-09-11 ENCOUNTER — Ambulatory Visit: Payer: Medicare HMO

## 2015-09-22 ENCOUNTER — Other Ambulatory Visit: Payer: Self-pay | Admitting: Family

## 2015-09-22 DIAGNOSIS — I10 Essential (primary) hypertension: Secondary | ICD-10-CM

## 2015-10-02 ENCOUNTER — Ambulatory Visit (INDEPENDENT_AMBULATORY_CARE_PROVIDER_SITE_OTHER): Payer: Medicare HMO | Admitting: General Practice

## 2015-10-02 DIAGNOSIS — Z5181 Encounter for therapeutic drug level monitoring: Secondary | ICD-10-CM

## 2015-10-02 LAB — POCT INR: INR: 2.9

## 2015-10-02 NOTE — Progress Notes (Signed)
I have reviewed and agree with the plan. 

## 2015-10-12 ENCOUNTER — Encounter: Payer: Self-pay | Admitting: Family

## 2015-10-12 ENCOUNTER — Ambulatory Visit (INDEPENDENT_AMBULATORY_CARE_PROVIDER_SITE_OTHER): Payer: Medicare HMO | Admitting: Family

## 2015-10-12 VITALS — BP 136/80 | HR 56 | Temp 97.8°F | Resp 16 | Ht 71.0 in | Wt 211.0 lb

## 2015-10-12 DIAGNOSIS — Z23 Encounter for immunization: Secondary | ICD-10-CM

## 2015-10-12 DIAGNOSIS — L309 Dermatitis, unspecified: Secondary | ICD-10-CM

## 2015-10-12 HISTORY — DX: Dermatitis, unspecified: L30.9

## 2015-10-12 MED ORDER — FLUOCINONIDE 0.05 % EX CREA: 1.0000 "application " | TOPICAL_CREAM | Freq: Two times a day (BID) | CUTANEOUS | 0 refills | Status: DC

## 2015-10-12 NOTE — Patient Instructions (Signed)
Thank you for choosing [facility].  SUMMARY AND INSTRUCTIONS:  Recommend Tinactin powder for your gloves.   Air out / Dry out glove nightly.   Medication:  Your prescription(s) have been submitted to your pharmacy or been printed and provided for you. Please take as directed and contact our office if you believe you are having problem(s) with the medication(s) or have any questions.  Follow up:  If your symptoms worsen or fail to improve, please contact our office for further instruction, or in case of emergency go directly to the emergency room at the closest medical facility.

## 2015-10-12 NOTE — Progress Notes (Signed)
Subjective:    Patient ID: William Norman, male    DOB: 1940-12-15, 75 y.o.   MRN: [id number]  Chief Complaint  Patient presents with  . Rash    fingers are cracking and painful it happens during the summer    HPI:  William Norman is a 75 y.o. male who  has a past medical history of BPH (benign prostatic hypertrophy); CAD (coronary artery disease); DVT (deep venous thrombosis) ([place]); Eczema; GERD (gastroesophageal reflux disease); Hyperlipidemia; and Hypertension. and presents today for an office visit.    1.) Rash of hands - Associated symptoms of a rash located on his bilateral hands that has been going for about 3 years and the timing of the symptoms is generally worse In the summer. Previously evaluated by dermatology and prescribed fluocinonide cream which has helped in the past. Notes he does wear gloves while outside in the heat. Rash has not spread and is described as itchy and red.   Allergies  Allergen Reactions  . Other Other (See Comments)    Some form of numbing medication used by dentist, almost died      Outpatient Medications Prior to Visit  Medication Sig Dispense Refill  . amLODipine (NORVASC) 5 MG tablet TAKE 1 TABLET(5 MG) BY MOUTH DAILY 90 tablet 1  . atenolol (TENORMIN) 100 MG tablet Take 1 tablet (100 mg total) by mouth daily. 90 tablet 3  . atorvastatin (LIPITOR) 40 MG tablet Take 1 tablet (40 mg total) by mouth daily. 90 tablet 3  . Coenzyme Q10 (COQ10) 200 MG CAPS Take by mouth.    . hydroxypropyl methylcellulose / hypromellose (ISOPTO TEARS / GONIOVISC) 2.5 % ophthalmic solution Place 1 drop into both eyes 4 (four) times daily as needed for dry eyes.    Marland Kitchen lisinopril (PRINIVIL,ZESTRIL) 40 MG tablet Take 1 tablet (40 mg total) by mouth daily. 90 tablet 3  . niacin 50 MG tablet Take 50 mg by mouth daily.    . nitroGLYCERIN (NITROSTAT) 0.4 MG SL tablet Dissolve 1 tablet under the tongue every 5 minutes as needed 30 tablet 1  . warfarin  (COUMADIN) 5 MG tablet TAKE AS DIRECTED BY ANTICOAGULATION CLINIC 90 tablet 0  . doxycycline (VIBRA-TABS) 100 MG tablet Take 1 tablet (100 mg total) by mouth 2 (two) times daily. 20 tablet 0  . fluocinonide cream (LIDEX) 0.05 %   2  . oxymetazoline (AFRIN) 0.05 % nasal spray Place 2 sprays into both nostrils every evening. Reported on 06/09/2015     No facility-administered medications prior to visit.      Review of Systems  Constitutional: Negative for chills and fever.  Skin: Positive for rash.      Objective:    BP 136/80 (BP Location: Left Arm, Patient Position: Sitting, Cuff Size: Normal)   Pulse (!) 56   Temp 97.8 F (36.6 C) (Oral)   Resp 16   Ht 5\' 11"  (1.803 m)   Wt 211 lb (95.7 kg)   SpO2 95%   BMI 29.43 kg/m  Nursing note and vital signs reviewed.  Physical Exam  Constitutional: He is oriented to person, place, and time. He appears well-developed and well-nourished. No distress.  Cardiovascular: Normal rate, regular rhythm, normal heart sounds and intact distal pulses.   Pulmonary/Chest: Effort normal and breath sounds normal.  Neurological: He is alert and oriented to person, place, and time.  Skin: Skin is warm and dry. Rash (Rash of bilateral hands with redness and white cracked skin.) noted.  Psychiatric: He has a normal mood and affect. His behavior is normal. Judgment and thought content normal.       Assessment & Plan:   Problem List Items Addressed This Visit      Musculoskeletal and Integument   Chronic dermatitis of hands - Primary    Symptoms and exam consistent with chronic dermatitis of his bilateral hands with questionable fungal infections or tinea manus. Start fluocinonide cream. Keep gloves dry as this is most likely a result of sweat and heat. Consider Tinactin powders to promote dryness.      Relevant Medications   fluocinonide cream (LIDEX) 0.05 %    Other Visit Diagnoses    Encounter for immunization       Relevant Orders   Flu  vaccine HIGH DOSE PF (Completed)       I have discontinued Mr. Wirtanen oxymetazoline, fluocinonide cream, and doxycycline. I am also having him start on fluocinonide cream. Additionally, I am having him maintain his niacin, hydroxypropyl methylcellulose / hypromellose, lisinopril, atorvastatin, atenolol, nitroGLYCERIN, CoQ10, warfarin, and amLODipine.   Meds ordered this encounter  Medications  . fluocinonide cream (LIDEX) 0.05 %    Sig: Apply 1 application topically 2 (two) times daily.    Dispense:  120 g    Refill:  0    Order Specific Question:   Supervising Provider    Answer:   Pricilla Holm A [id number]     Follow-up: Return if symptoms worsen or fail to improve.  William Po, FNP

## 2015-10-12 NOTE — Assessment & Plan Note (Signed)
Symptoms and exam consistent with chronic dermatitis of his bilateral hands with questionable fungal infections or tinea manus. Start fluocinonide cream. Keep gloves dry as this is most likely a result of sweat and heat. Consider Tinactin powders to promote dryness.

## 2015-10-30 ENCOUNTER — Ambulatory Visit (INDEPENDENT_AMBULATORY_CARE_PROVIDER_SITE_OTHER): Payer: Medicare HMO | Admitting: General Practice

## 2015-10-30 DIAGNOSIS — Z5181 Encounter for therapeutic drug level monitoring: Secondary | ICD-10-CM

## 2015-10-30 LAB — POCT INR: INR: 2.9

## 2015-11-13 DIAGNOSIS — I10 Essential (primary) hypertension: Secondary | ICD-10-CM | POA: Diagnosis not present

## 2015-11-13 DIAGNOSIS — E785 Hyperlipidemia, unspecified: Secondary | ICD-10-CM | POA: Diagnosis not present

## 2015-11-13 DIAGNOSIS — Z9861 Coronary angioplasty status: Secondary | ICD-10-CM | POA: Diagnosis not present

## 2015-11-13 DIAGNOSIS — Z7901 Long term (current) use of anticoagulants: Secondary | ICD-10-CM | POA: Diagnosis not present

## 2015-11-13 DIAGNOSIS — I2510 Atherosclerotic heart disease of native coronary artery without angina pectoris: Secondary | ICD-10-CM | POA: Diagnosis not present

## 2015-11-13 DIAGNOSIS — Z86718 Personal history of other venous thrombosis and embolism: Secondary | ICD-10-CM | POA: Diagnosis not present

## 2015-11-13 DIAGNOSIS — E668 Other obesity: Secondary | ICD-10-CM | POA: Diagnosis not present

## 2015-11-17 ENCOUNTER — Telehealth: Payer: Self-pay | Admitting: Family

## 2015-11-17 ENCOUNTER — Other Ambulatory Visit: Payer: Self-pay | Admitting: Family

## 2015-11-17 NOTE — Telephone Encounter (Signed)
Patient is due for atenolol (TENORMIN) 100 MG tablet [id number]   refill. Due to shortage, they are recommending an alternative. please call in altenalol alternative.

## 2015-11-19 NOTE — Telephone Encounter (Signed)
Patient will have enough medication to last until Tuesday when Marya Amsler gets back

## 2015-11-23 MED ORDER — METOPROLOL SUCCINATE ER 50 MG PO TB24: 50.0000 mg | ORAL_TABLET | Freq: Every day | ORAL | 2 refills | Status: DC

## 2015-11-23 NOTE — Telephone Encounter (Signed)
Medication changed to metoprolol.

## 2015-11-24 ENCOUNTER — Other Ambulatory Visit: Payer: Self-pay | Admitting: Family

## 2015-11-24 NOTE — Telephone Encounter (Signed)
Tried to contact patient.  No ring

## 2015-11-27 ENCOUNTER — Ambulatory Visit: Payer: Medicare HMO

## 2015-12-31 DIAGNOSIS — R69 Illness, unspecified: Secondary | ICD-10-CM | POA: Diagnosis not present

## 2016-01-01 ENCOUNTER — Ambulatory Visit (INDEPENDENT_AMBULATORY_CARE_PROVIDER_SITE_OTHER): Payer: Medicare HMO | Admitting: General Practice

## 2016-01-01 DIAGNOSIS — Z5181 Encounter for therapeutic drug level monitoring: Secondary | ICD-10-CM | POA: Diagnosis not present

## 2016-01-01 LAB — POCT INR: INR: 2.3

## 2016-01-01 NOTE — Progress Notes (Signed)
I have reviewed and agree with the plan. 

## 2016-01-01 NOTE — Patient Instructions (Signed)
Pre visit review using our clinic review tool, if applicable. No additional management support is needed unless otherwise documented below in the visit note. 

## 2016-01-06 ENCOUNTER — Other Ambulatory Visit: Payer: Self-pay | Admitting: Family Medicine

## 2016-02-17 ENCOUNTER — Other Ambulatory Visit: Payer: Self-pay

## 2016-02-17 DIAGNOSIS — I10 Essential (primary) hypertension: Secondary | ICD-10-CM

## 2016-02-17 MED ORDER — WARFARIN SODIUM 5 MG PO TABS: ORAL_TABLET | ORAL | 0 refills | Status: DC

## 2016-02-17 MED ORDER — AMLODIPINE BESYLATE 5 MG PO TABS: ORAL_TABLET | ORAL | 1 refills | Status: DC

## 2016-02-18 ENCOUNTER — Telehealth: Payer: Self-pay | Admitting: Family

## 2016-02-18 ENCOUNTER — Other Ambulatory Visit: Payer: Self-pay | Admitting: Emergency Medicine

## 2016-02-18 NOTE — Telephone Encounter (Signed)
Patient walked in today  States that [facility] [address] has altenolol. He is asking that you change the metoprolol back to altenolol and [facility] to [facility] [address].   Additionally, dr Sherren Mocha was sent refills back in November for atorvistatin and lisinopril. Only filled x90. Patient is asking that you also send those for a 6 month to avoid refill calls.

## 2016-02-18 NOTE — Telephone Encounter (Signed)
Spoke with pharmacy in reference to a medication request for Lisinopril. Explained to pharmacist that the pt as a new pcp and hasn't been in our office since 10/2014.

## 2016-02-19 ENCOUNTER — Other Ambulatory Visit: Payer: Self-pay

## 2016-02-19 ENCOUNTER — Other Ambulatory Visit: Payer: Self-pay | Admitting: Emergency Medicine

## 2016-02-19 ENCOUNTER — Ambulatory Visit: Payer: Medicare HMO

## 2016-02-19 DIAGNOSIS — L309 Dermatitis, unspecified: Secondary | ICD-10-CM

## 2016-02-19 MED ORDER — ATORVASTATIN CALCIUM 40 MG PO TABS: ORAL_TABLET | ORAL | 1 refills | Status: DC

## 2016-02-19 MED ORDER — LISINOPRIL 40 MG PO TABS: ORAL_TABLET | ORAL | 1 refills | Status: DC

## 2016-02-19 MED ORDER — ATENOLOL 100 MG PO TABS: 100.0000 mg | ORAL_TABLET | Freq: Every day | ORAL | 1 refills | Status: DC

## 2016-02-19 MED ORDER — FLUOCINONIDE 0.05 % EX CREA: 1.0000 "application " | TOPICAL_CREAM | Freq: Two times a day (BID) | CUTANEOUS | 0 refills | Status: DC

## 2016-02-19 NOTE — Addendum Note (Signed)
Addended by: Delice Bison E on: 02/19/2016 01:49 PM   Modules accepted: Orders

## 2016-02-19 NOTE — Telephone Encounter (Signed)
All medications have been filled.

## 2016-03-04 ENCOUNTER — Ambulatory Visit (INDEPENDENT_AMBULATORY_CARE_PROVIDER_SITE_OTHER): Payer: Medicare HMO | Admitting: General Practice

## 2016-03-04 DIAGNOSIS — Z5181 Encounter for therapeutic drug level monitoring: Secondary | ICD-10-CM

## 2016-03-04 LAB — POCT INR: INR: 1.9

## 2016-03-04 NOTE — Progress Notes (Signed)
I have reviewed and agree with the plan. 

## 2016-03-04 NOTE — Patient Instructions (Signed)
Pre visit review using our clinic review tool, if applicable. No additional management support is needed unless otherwise documented below in the visit note. 

## 2016-04-01 ENCOUNTER — Ambulatory Visit (INDEPENDENT_AMBULATORY_CARE_PROVIDER_SITE_OTHER): Payer: Medicare HMO | Admitting: General Practice

## 2016-04-01 DIAGNOSIS — Z5181 Encounter for therapeutic drug level monitoring: Secondary | ICD-10-CM

## 2016-04-01 LAB — POCT INR: INR: 1.8

## 2016-04-01 NOTE — Patient Instructions (Signed)
Pre visit review using our clinic review tool, if applicable. No additional management support is needed unless otherwise documented below in the visit note. 

## 2016-04-01 NOTE — Progress Notes (Signed)
I have reviewed and agree with the plan. 

## 2016-04-29 ENCOUNTER — Ambulatory Visit (INDEPENDENT_AMBULATORY_CARE_PROVIDER_SITE_OTHER): Payer: Medicare HMO | Admitting: General Practice

## 2016-04-29 DIAGNOSIS — Z5181 Encounter for therapeutic drug level monitoring: Secondary | ICD-10-CM | POA: Diagnosis not present

## 2016-04-29 LAB — POCT INR: INR: 2

## 2016-04-29 NOTE — Progress Notes (Signed)
I have reviewed and agree with the plan. 

## 2016-04-29 NOTE — Patient Instructions (Signed)
Pre visit review using our clinic review tool, if applicable. No additional management support is needed unless otherwise documented below in the visit note. 

## 2016-05-19 ENCOUNTER — Other Ambulatory Visit: Payer: Self-pay | Admitting: Family

## 2016-05-27 ENCOUNTER — Ambulatory Visit (INDEPENDENT_AMBULATORY_CARE_PROVIDER_SITE_OTHER): Payer: Medicare HMO | Admitting: General Practice

## 2016-05-27 DIAGNOSIS — I82409 Acute embolism and thrombosis of unspecified deep veins of unspecified lower extremity: Secondary | ICD-10-CM | POA: Diagnosis not present

## 2016-05-27 DIAGNOSIS — Z5181 Encounter for therapeutic drug level monitoring: Secondary | ICD-10-CM

## 2016-05-27 LAB — POCT INR: INR: 2.3

## 2016-05-27 NOTE — Patient Instructions (Signed)
Pre visit review using our clinic review tool, if applicable. No additional management support is needed unless otherwise documented below in the visit note. 

## 2016-05-27 NOTE — Progress Notes (Signed)
I have reviewed and agree with the plan. 

## 2016-06-20 ENCOUNTER — Encounter: Payer: Self-pay | Admitting: Family

## 2016-06-20 ENCOUNTER — Ambulatory Visit (INDEPENDENT_AMBULATORY_CARE_PROVIDER_SITE_OTHER): Payer: Medicare HMO | Admitting: Family

## 2016-06-20 ENCOUNTER — Other Ambulatory Visit (INDEPENDENT_AMBULATORY_CARE_PROVIDER_SITE_OTHER): Payer: Medicare HMO

## 2016-06-20 VITALS — BP 138/70 | HR 54 | Temp 98.0°F | Resp 16 | Ht 71.0 in | Wt 209.8 lb

## 2016-06-20 DIAGNOSIS — I10 Essential (primary) hypertension: Secondary | ICD-10-CM | POA: Diagnosis not present

## 2016-06-20 DIAGNOSIS — Z125 Encounter for screening for malignant neoplasm of prostate: Secondary | ICD-10-CM | POA: Diagnosis not present

## 2016-06-20 DIAGNOSIS — Z0000 Encounter for general adult medical examination without abnormal findings: Secondary | ICD-10-CM

## 2016-06-20 DIAGNOSIS — I2510 Atherosclerotic heart disease of native coronary artery without angina pectoris: Secondary | ICD-10-CM

## 2016-06-20 DIAGNOSIS — R351 Nocturia: Secondary | ICD-10-CM | POA: Diagnosis not present

## 2016-06-20 DIAGNOSIS — E785 Hyperlipidemia, unspecified: Secondary | ICD-10-CM

## 2016-06-20 DIAGNOSIS — N401 Enlarged prostate with lower urinary tract symptoms: Secondary | ICD-10-CM | POA: Diagnosis not present

## 2016-06-20 LAB — COMPREHENSIVE METABOLIC PANEL
ALT: 19 U/L (ref 0–53)
AST: 25 U/L (ref 0–37)
Albumin: 4.3 g/dL (ref 3.5–5.2)
Alkaline Phosphatase: 44 U/L (ref 39–117)
BUN: 21 mg/dL (ref 6–23)
CO2: 28 mEq/L (ref 19–32)
Calcium: 9.7 mg/dL (ref 8.4–10.5)
Chloride: 107 mEq/L (ref 96–112)
Creatinine, Ser: 0.79 mg/dL (ref 0.40–1.50)
GFR: 101.22 mL/min (ref >60.00)
Glucose, Bld: 106 mg/dL — ABNORMAL HIGH (ref 70–99)
Potassium: 4.4 mEq/L (ref 3.5–5.1)
Sodium: 140 mEq/L (ref 135–145)
Total Bilirubin: 0.7 mg/dL (ref 0.2–1.2)
Total Protein: 6.6 g/dL (ref 6.0–8.3)

## 2016-06-20 LAB — LIPID PANEL
Cholesterol: 145 mg/dL (ref 0–200)
HDL: 45.3 mg/dL (ref >39.00)
LDL Cholesterol: 76 mg/dL (ref 0–99)
NonHDL: 99.24
Total CHOL/HDL Ratio: 3
Triglycerides: 118 mg/dL (ref 0.0–149.0)
VLDL: 23.6 mg/dL (ref 0.0–40.0)

## 2016-06-20 LAB — CBC
HCT: 45.3 % (ref 39.0–52.0)
Hemoglobin: 15.1 g/dL (ref 13.0–17.0)
MCHC: 33.3 g/dL (ref 30.0–36.0)
MCV: 88.1 fl (ref 78.0–100.0)
Platelets: 208 10*3/uL (ref 150.0–400.0)
RBC: 5.14 Mil/uL (ref 4.22–5.81)
RDW: 14.3 % (ref 11.5–15.5)
WBC: 5.6 10*3/uL (ref 4.0–10.5)

## 2016-06-20 LAB — PSA: PSA: 2.01 ng/mL (ref 0.10–4.00)

## 2016-06-20 MED ORDER — ZOSTER VAC RECOMB ADJUVANTED 50 MCG/0.5ML IM SUSR: 0.5000 mL | Freq: Once | INTRAMUSCULAR | 0 refills | Status: DC

## 2016-06-20 MED ORDER — ZOSTER VAC RECOMB ADJUVANTED 50 MCG/0.5ML IM SUSR
0.5000 mL | Freq: Once | INTRAMUSCULAR | 0 refills | Status: AC
Start: 2016-06-20 — End: 2016-06-20

## 2016-06-20 NOTE — Assessment & Plan Note (Signed)
Blood pressure adequate control and below goal 140/90 with current medication regimen and no adverse side effects or hypotensive readings. Continue current dosage of amlodipine, atenolol and lisinopril. Continue to monitor blood pressure, follow sodium diet.

## 2016-06-20 NOTE — Assessment & Plan Note (Signed)
Currently maintained on atorvastatin with no adverse side effects. Continue current dosage of atorvastatin pending lipid profile results.

## 2016-06-20 NOTE — Assessment & Plan Note (Signed)
Reviewed and updated patient's medical, surgical, family and social history. Medications and allergies were also reviewed. Basic screenings for depression, activities of daily living, hearing, cognition and safety were performed. Provider list was updated and health plan was provided to the patient.  

## 2016-06-20 NOTE — Patient Instructions (Addendum)
Thank you for choosing [facility].  SUMMARY AND INSTRUCTIONS:  Medication:  Your prescription(s) have been submitted to your pharmacy or been printed and provided for you. Please take as directed and contact our office if you believe you are having problem(s) with the medication(s) or have any questions.  Labs:  Please stop by the lab on the lower level of the building for your blood work. Your results will be released to [place] (or called to you) after review, usually within 72 hours after test completion. If any changes need to be made, you will be notified at that same time.  1.) The lab is open from 7:30am to 5:30 pm Monday-Friday 2.) No appointment is necessary 3.) Fasting (if needed) is 6-8 hours after food and drink; black coffee and water are okay   Imaging / Radiology:  Please stop by radiology on the basement level of the building for your x-rays. Your results will be released to [place] (or called to you) after review, usually within 72 hours after test completion. If any treatments or changes are necessary, you will be notified at that same time.  Referrals:  Referrals have been made during this visit. You should expect to hear back from our schedulers in about 7-10 days in regards to establishing an appointment with the specialists we discussed.   Follow up:  If your symptoms worsen or fail to improve, please contact our office for further instruction, or in case of emergency go directly to the emergency room at the closest medical facility.    Health Maintenance  Topic Date Due  . INFLUENZA VACCINE  09/14/2016  . TETANUS/TDAP  10/20/2024  . PNA vac Low Risk Adult  Completed    Health Maintenance, Male A healthy lifestyle and preventive care is important for your health and wellness. Ask your health care provider about what schedule of regular examinations is right for you. What should I know about weight and diet?  Eat a Healthy Diet  Eat plenty of  vegetables, fruits, whole grains, low-fat dairy products, and lean protein.  Do not eat a lot of foods high in solid fats, added sugars, or salt. Maintain a Healthy Weight  Regular exercise can help you achieve or maintain a healthy weight. You should:  Do at least 150 minutes of exercise each week. The exercise should increase your heart rate and make you sweat (moderate-intensity exercise).  Do strength-training exercises at least twice a week. Watch Your Levels of Cholesterol and Blood Lipids  Have your blood tested for lipids and cholesterol every 5 years starting at 77 years of age. If you are at high risk for heart disease, you should start having your blood tested when you are 77 years old. You may need to have your cholesterol levels checked more often if:  Your lipid or cholesterol levels are high.  You are older than 77 years of age.  You are at high risk for heart disease. What should I know about cancer screening? Many types of cancers can be detected early and may often be prevented. Lung Cancer  You should be screened every year for lung cancer if:  You are a current smoker who has smoked for at least 30 years.  You are a former smoker who has quit within the past 15 years.  Talk to your health care provider about your screening options, when you should start screening, and how often you should be screened. Colorectal Cancer  Routine colorectal cancer screening usually begins at  77 years of age and should be repeated every 5-10 years until you are 77 years old. You may need to be screened more often if early forms of precancerous polyps or small growths are found. Your health care provider may recommend screening at an earlier age if you have risk factors for colon cancer.  Your health care provider may recommend using home test kits to check for hidden blood in the stool.  A small camera at the end of a tube can be used to examine your colon (sigmoidoscopy or  colonoscopy). This checks for the earliest forms of colorectal cancer. Prostate and Testicular Cancer  Depending on your age and overall health, your health care provider may do certain tests to screen for prostate and testicular cancer.  Talk to your health care provider about any symptoms or concerns you have about testicular or prostate cancer. Skin Cancer  Check your skin from head to toe regularly.  Tell your health care provider about any new moles or changes in moles, especially if:  There is a change in a mole's size, shape, or color.  You have a mole that is larger than a pencil eraser.  Always use sunscreen. Apply sunscreen liberally and repeat throughout the day.  Protect yourself by wearing long sleeves, pants, a wide-brimmed hat, and sunglasses when outside. What should I know about heart disease, diabetes, and high blood pressure?  If you are 58-21 years of age, have your blood pressure checked every 3-5 years. If you are 2 years of age or older, have your blood pressure checked every year. You should have your blood pressure measured twice-once when you are at a hospital or clinic, and once when you are not at a hospital or clinic. Record the average of the two measurements. To check your blood pressure when you are not at a hospital or clinic, you can use:  An automated blood pressure machine at a pharmacy.  A home blood pressure monitor.  Talk to your health care provider about your target blood pressure.  If you are between 74-16 years old, ask your health care provider if you should take aspirin to prevent heart disease.  Have regular diabetes screenings by checking your fasting blood sugar level.  If you are at a normal weight and have a low risk for diabetes, have this test once every three years after the age of 73.  If you are overweight and have a high risk for diabetes, consider being tested at a younger age or more often.  A one-time screening for  abdominal aortic aneurysm (AAA) by ultrasound is recommended for men aged 70-75 years who are current or former smokers. What should I know about preventing infection? Hepatitis B  If you have a higher risk for hepatitis B, you should be screened for this virus. Talk with your health care provider to find out if you are at risk for hepatitis B infection. Hepatitis C  Blood testing is recommended for:  Everyone born from [birth year] through 1966.  Anyone with known risk factors for hepatitis C. Sexually Transmitted Diseases (STDs)  You should be screened each year for STDs including gonorrhea and chlamydia if:  You are sexually active and are younger than 77 years of age.  You are older than 77 years of age and your health care provider tells you that you are at risk for this type of infection.  Your sexual activity has changed since you were last screened and you are at an increased  risk for chlamydia or gonorrhea. Ask your health care provider if you are at risk.  Talk with your health care provider about whether you are at high risk of being infected with HIV. Your health care provider may recommend a prescription medicine to help prevent HIV infection. What else can I do?  Schedule regular health, dental, and eye exams.  Stay current with your vaccines (immunizations).  Do not use any tobacco products, such as cigarettes, chewing tobacco, and e-cigarettes. If you need help quitting, ask your health care provider.  Limit alcohol intake to no more than 2 drinks per day. One drink equals 12 ounces of beer, 5 ounces of wine, or 1 ounces of hard liquor.  Do not use street drugs.  Do not share needles.  Ask your health care provider for help if you need support or information about quitting drugs.  Tell your health care provider if you often feel depressed.  Tell your health care provider if you have ever been abused or do not feel safe at home. This information is not intended to  replace advice given to you by your health care provider. Make sure you discuss any questions you have with your health care provider. Document Released: 07/30/2007 Document Revised: 09/30/2015 Document Reviewed: 11/04/2014 Elsevier Interactive Patient Education  2017 [employer].

## 2016-06-20 NOTE — Assessment & Plan Note (Signed)
Continues to experience BPH associated with nocturia. Previously maintained on Flomax with no significant improvement. Refer to urology for further assessment and treatment.

## 2016-06-20 NOTE — Progress Notes (Signed)
Subjective:    Patient ID: William Norman, male    DOB: [birth year], 76 y.o.   MRN: [id number]  Chief Complaint  Patient presents with  . CPE    fasting    HPI:  William Norman is a 76 y.o. male who presents today for a Medicare Annual Wellness/Physical exam.    1) Health Maintenance -   Diet - Averaging about 1-2 meals per day consisting of a regular diet; Caffeine intake of 2-3 cups per day   Exercise - Stays active; outdoor yard work; No other structured exercise.   2) Preventative Exams / Immunizations:  Dental -- Due for exam  Vision -- Up to date   Health Maintenance  Topic Date Due  . INFLUENZA VACCINE  09/14/2016  . TETANUS/TDAP  10/20/2024  . PNA vac Low Risk Adult  Completed     Immunization History  Administered Date(s) Administered  . Influenza Split 11/16/2012  . Influenza Whole 02/14/2005, 11/20/2007, 11/14/2009  . Influenza, High Dose Seasonal PF 10/21/2014, 10/12/2015  . Influenza,inj,Quad PF,36+ Mos 10/10/2013  . Pneumococcal Conjugate-13 10/21/2014  . Pneumococcal Polysaccharide-23 02/15/2003, 02/18/2008  . Td 02/15/2004, 10/21/2014  . Zoster 03/09/2010    RISK FACTORS  Tobacco History  Smoking Status  . Former Smoker  . Packs/day: 1.00  . Years: 60.00  . Types: Cigarettes  . Quit date: 03/25/1998  Smokeless Tobacco  . Never Used     Cardiac risk factors: advanced age (older than 45 for men, 66 for women), dyslipidemia, hypertension, male gender and sedentary lifestyle.  Depression Screen  Depression screen [facility] 2/9 06/20/2016  Decreased Interest 0  Down, Depressed, Hopeless 0  PHQ - 2 Score 0     Activities of Daily Living In your present state of health, do you have any difficulty performing the following activities?:  Driving? No Managing money?  No Feeding yourself? No Getting from bed to chair? No Climbing a flight of stairs? No Preparing food and eating?: No Bathing or showering? No Getting dressed:  No Getting to the toilet? No Using the toilet: No Moving around from place to place: No In the past year have you fallen or had a near fall?:No   Home Safety Has smoke detector and wears seat belts. No firearms. No excess sun exposure. Are there smokers in your home (other than you)?  No Do you feel safe at home?  Yes  Hearing Difficulties: No Do you often ask people to speak up or repeat themselves? No Do you experience ringing or noises in your ears? No  Do you have difficulty understanding soft or whispered voices? No    Cognitive Testing  Alert? Yes   Normal Appearance? Yes  Oriented to person? Yes  Place? Yes   Time? Yes  Recall of three objects?  Yes  Can perform simple calculations? Yes  Displays appropriate judgment? Yes  Can read the correct time from a watch face? Yes  Do you feel that you have a problem with memory? No  Do you often misplace items? No   Advanced Directives have been discussed with the patient? Yes   Current Physicians/Providers and Suppliers  1. Terri Piedra, FNP - Internal Medicine 2. Meriam Sprague, RN -  Coumadin Management 3. Dr. Gershon [facility] - Opthalmology  Indicate any recent Medical Services you may have received from other than [facility] providers in the past year (date may be approximate).  All answers were reviewed with the patient and necessary referrals were made:  Mauricio Po,  FNP   06/20/2016    Allergies  Allergen Reactions  . Other Other (See Comments)    Some form of numbing medication used by dentist, almost died     Outpatient Medications Prior to Visit  Medication Sig Dispense Refill  . amLODipine (NORVASC) 5 MG tablet TAKE 1 TABLET(5 MG) BY MOUTH DAILY 90 tablet 1  . atenolol (TENORMIN) 100 MG tablet Take 1 tablet (100 mg total) by mouth daily. 90 tablet 1  . atorvastatin (LIPITOR) 40 MG tablet TAKE 1 TABLET(40 MG) BY MOUTH DAILY 90 tablet 1  . Coenzyme Q10 (COQ10) 200 MG CAPS Take by mouth.    . fluocinonide cream (LIDEX)  2.37 % Apply 1 application topically 2 (two) times daily. 120 g 0  . hydroxypropyl methylcellulose / hypromellose (ISOPTO TEARS / GONIOVISC) 2.5 % ophthalmic solution Place 1 drop into both eyes 4 (four) times daily as needed for dry eyes.    . metoprolol succinate (TOPROL-XL) 50 MG 24 hr tablet TAKE 1 TABLET BY MOUTH DAILY WITH OR IMMEDIATELY FOLLOWING A MEAL 90 tablet 2  . niacin 50 MG tablet Take 50 mg by mouth daily.    . nitroGLYCERIN (NITROSTAT) 0.4 MG SL tablet Dissolve 1 tablet under the tongue every 5 minutes as needed 30 tablet 1  . warfarin (COUMADIN) 5 MG tablet TAKE AS DIRECTED BY ANTICOAGULATION CLINIC 90 tablet 0  . lisinopril (PRINIVIL,ZESTRIL) 40 MG tablet TAKE 1 TABLET(40 MG) BY MOUTH DAILY 90 tablet 1   No facility-administered medications prior to visit.      Past Medical History:  Diagnosis Date  . BPH (benign prostatic hypertrophy)   . CAD (coronary artery disease)   . DVT (deep venous thrombosis) ([place])   . Eczema   . GERD (gastroesophageal reflux disease)   . Hyperlipidemia   . Hypertension      Past Surgical History:  Procedure Laterality Date  . CARPAL TUNNEL RELEASE  04/21/2011   Procedure: CARPAL TUNNEL RELEASE;  Surgeon: Tennis Must, MD;  Location: [facility];  Service: Orthopedics;  Laterality: Left;  . CARPAL TUNNEL RELEASE  05/23/2011   Procedure: CARPAL TUNNEL RELEASE;  Surgeon: Tennis Must, MD;  Location: [facility];  Service: Orthopedics;  Laterality: Right;  . COLON SURGERY  2010 and 2011 colostomy reversal  . precutaneous transluminal coronary angioplasty    . right hernia    . TONSILLECTOMY AND ADENOIDECTOMY       Family History  Problem Relation Age of Onset  . Heart disease Father      Social History   Social History  . Marital status: Married    Spouse name: N/A  . Number of children: 3  . Years of education: 36   Occupational History  . Retired    Social History Main Topics  . Smoking status:  Former Smoker    Packs/day: 1.00    Years: 60.00    Types: Cigarettes    Quit date: 03/25/1998  . Smokeless tobacco: Never Used  . Alcohol use 4.2 oz/week    7 Glasses of wine per week  . Drug use: No  . Sexual activity: Not on file   Other Topics Concern  . Not on file   Social History Narrative   Fun: Working outside in yard, cutting wood    Denies abuse and feels safe at home.      Review of Systems  Constitutional: Denies fever, chills, fatigue, or significant weight gain/loss. HENT: Head: Denies headache  or neck pain Ears: Denies changes in hearing, ringing in ears, earache, drainage Nose: Denies discharge, stuffiness, itching, nosebleed, sinus pain Throat: Denies sore throat, hoarseness, dry mouth, sores, thrush Eyes: Denies loss/changes in vision, pain, redness, blurry/double vision, flashing lights Cardiovascular: Denies chest pain/discomfort, tightness, palpitations, shortness of breath with activity, difficulty lying down, swelling, sudden awakening with shortness of breath Respiratory: Denies shortness of breath, cough, sputum production, wheezing Gastrointestinal: Denies dysphasia, heartburn, change in appetite, nausea, change in bowel habits, rectal bleeding, constipation, diarrhea, yellow skin or eyes Genitourinary: Denies frequency, urgency, burning/pain, blood in urine, incontinence, change in urinary strength. Musculoskeletal: Denies muscle/joint pain, stiffness, back pain, redness or swelling of joints, trauma Skin: Denies rashes, lumps, itching, dryness, color changes, or hair/nail changes Neurological: Denies dizziness, fainting, seizures, weakness, numbness, tingling, tremor Psychiatric - Denies nervousness, stress, depression or memory loss Endocrine: Denies heat or cold intolerance, sweating, frequent urination, excessive thirst, changes in appetite Hematologic: Denies ease of bruising or bleeding    Objective:     BP 138/70 (BP Location: Left Arm,  Patient Position: Sitting, Cuff Size: Large)   Pulse (!) 54   Temp 98 F (36.7 C) (Oral)   Resp 16   Ht 5\' 11"  (1.803 m)   Wt 209 lb 12.8 oz (95.2 kg)   SpO2 96%   BMI 29.26 kg/m  Nursing note and vital signs reviewed.  Physical Exam  Constitutional: He is oriented to person, place, and time. He appears well-developed and well-nourished.  HENT:  Head: Normocephalic.  Right Ear: Hearing, tympanic membrane, external ear and ear canal normal.  Left Ear: Hearing, tympanic membrane, external ear and ear canal normal.  Nose: Nose normal.  Mouth/Throat: Uvula is midline, oropharynx is clear and moist and mucous membranes are normal.  Eyes: Conjunctivae and EOM are normal. Pupils are equal, round, and reactive to light.  Neck: Neck supple. No JVD present. No tracheal deviation present. No thyromegaly present.  Cardiovascular: Normal rate, regular rhythm, normal heart sounds and intact distal pulses.   Pulmonary/Chest: Effort normal and breath sounds normal.  Abdominal: Soft. Bowel sounds are normal. He exhibits no distension and no mass. There is no tenderness. There is no rebound and no guarding.  Musculoskeletal: Normal range of motion. He exhibits no edema or tenderness.  Lymphadenopathy:    He has no cervical adenopathy.  Neurological: He is alert and oriented to person, place, and time. He has normal reflexes. No cranial nerve deficit. He exhibits normal muscle tone. Coordination normal.  Skin: Skin is warm and dry.  Psychiatric: He has a normal mood and affect. His behavior is normal. Judgment and thought content normal.       Assessment & Plan:   During the course of the visit the patient was educated and counseled about appropriate screening and preventive services including:    Pneumococcal vaccine   Influenza vaccine  Prostate cancer screening  Diabetes screening  Glaucoma screening  Nutrition counseling   Diet review for nutrition referral? Yes ____  Not  Indicated _X___   Patient Instructions (the written plan) was given to the patient.  Medicare Attestation I have personally reviewed: The patient's medical and social history Their use of alcohol, tobacco or illicit drugs Their current medications and supplements The patient's functional ability including ADLs,fall risks, home safety risks, cognitive, and hearing and visual impairment Diet and physical activities Evidence for depression or mood disorders  The patient's weight, height, BMI,  have been recorded in the chart.  I have made  referrals, counseling, and provided education to the patient based on review of the above and I have provided the patient with a written personalized care plan for preventive services.     Problem List Items Addressed This Visit      Cardiovascular and Mediastinum   Essential hypertension (Chronic)    Blood pressure adequate control and below goal 140/90 with current medication regimen and no adverse side effects or hypotensive readings. Continue current dosage of amlodipine, atenolol and lisinopril. Continue to monitor blood pressure, follow sodium diet.      CAD (coronary artery disease), native coronary artery (Chronic)    No current exacerbations or cardiac symptoms. Continue to monitor through risk factor modification for hypertension and hyperlipidemia.        Genitourinary   BPH associated with nocturia    Continues to experience BPH associated with nocturia. Previously maintained on Flomax with no significant improvement. Refer to urology for further assessment and treatment.      Relevant Orders   Ambulatory referral to Urology     Other   Dyslipidemia (Chronic)    Currently maintained on atorvastatin with no adverse side effects. Continue current dosage of atorvastatin pending lipid profile results.      Medicare annual wellness visit, subsequent    Reviewed and updated patient's medical, surgical, family and social history.  Medications and allergies were also reviewed. Basic screenings for depression, activities of daily living, hearing, cognition and safety were performed. Provider list was updated and health plan was provided to the patient.       Routine general medical examination at a health care facility - Primary    1) Anticipatory Guidance: Discussed importance of wearing a seatbelt while driving and not texting while driving; changing batteries in smoke detector at least once annually; wearing suntan lotion when outside; eating a balanced and moderate diet; getting physical activity at least 30 minutes per day.  2) Immunizations / Screenings / Labs:  Written prescription for Shingrix provided. All other immunizations are up to date per recommendations. Due for a dental exam encouraged to be completed independently. Obtain PSA for prostate cancer screening. Aged out of colon cancer screening. All other screenings are up to date per recommendations. Obtain CBC, CMET, and lipid profile.    Overall well exam with risk factors for cardiovascular disease including hyperlipidemia, hypertension, coronary artery disease, and overweight. Chronic conditions appear adequately managed through medication regimen with no significant adverse side effects. Encouraged increased physical activity to 30 minutes of moderate level activity 2-3 days per week or about 10,000 steps per day. Continue other healthy lifestyle behaviors and choices. Follow-up prevention exam in 1 year. Follow-up office visit for chronic conditions pending blood work.      Relevant Orders   CBC   Comprehensive metabolic panel   Lipid panel   PSA       I have discontinued Mr. Clason's lisinopril. I am also having him maintain his niacin, hydroxypropyl methylcellulose / hypromellose, nitroGLYCERIN, CoQ10, metoprolol succinate, amLODipine, atenolol, atorvastatin, fluocinonide cream, warfarin, and Zoster Vac Recomb Adjuvanted.   Meds ordered this  encounter  Medications  . DISCONTD: Zoster Vac Recomb Adjuvanted [facility]) injection    Sig: Inject 0.5 mLs into the muscle once.    Dispense:  0.5 mL    Refill:  0    Order Specific Question:   Supervising Provider    Answer:   Pricilla Holm A [[id number]]  . Zoster Vac Recomb Adjuvanted [facility]) injection    Sig: Inject  0.5 mLs into the muscle once.    Dispense:  0.5 mL    Refill:  0    Complete 2-6 months after first injection date.    Order Specific Question:   Supervising Provider    Answer:   Pricilla Holm A [[id number]]     Follow-up: Return in about 6 months (around 12/21/2016), or if symptoms worsen or fail to improve.   Mauricio Po, FNP

## 2016-06-20 NOTE — Assessment & Plan Note (Signed)
1) Anticipatory Guidance: Discussed importance of wearing a seatbelt while driving and not texting while driving; changing batteries in smoke detector at least once annually; wearing suntan lotion when outside; eating a balanced and moderate diet; getting physical activity at least 30 minutes per day.  2) Immunizations / Screenings / Labs:  Written prescription for Shingrix provided. All other immunizations are up to date per recommendations. Due for a dental exam encouraged to be completed independently. Obtain PSA for prostate cancer screening. Aged out of colon cancer screening. All other screenings are up to date per recommendations. Obtain CBC, CMET, and lipid profile.    Overall well exam with risk factors for cardiovascular disease including hyperlipidemia, hypertension, coronary artery disease, and overweight. Chronic conditions appear adequately managed through medication regimen with no significant adverse side effects. Encouraged increased physical activity to 30 minutes of moderate level activity 2-3 days per week or about 10,000 steps per day. Continue other healthy lifestyle behaviors and choices. Follow-up prevention exam in 1 year. Follow-up office visit for chronic conditions pending blood work.

## 2016-06-20 NOTE — Assessment & Plan Note (Signed)
No current exacerbations or cardiac symptoms. Continue to monitor through risk factor modification for hypertension and hyperlipidemia.

## 2016-06-24 ENCOUNTER — Ambulatory Visit (INDEPENDENT_AMBULATORY_CARE_PROVIDER_SITE_OTHER): Payer: Medicare HMO | Admitting: General Practice

## 2016-06-24 DIAGNOSIS — Z5181 Encounter for therapeutic drug level monitoring: Secondary | ICD-10-CM | POA: Diagnosis not present

## 2016-06-24 LAB — POCT INR: INR: 2.6

## 2016-06-24 NOTE — Patient Instructions (Signed)
Pre visit review using our clinic review tool, if applicable. No additional management support is needed unless otherwise documented below in the visit note. 

## 2016-06-24 NOTE — Progress Notes (Signed)
I have reviewed and agree with the plan. 

## 2016-07-15 DIAGNOSIS — R69 Illness, unspecified: Secondary | ICD-10-CM | POA: Diagnosis not present

## 2016-08-05 ENCOUNTER — Ambulatory Visit (INDEPENDENT_AMBULATORY_CARE_PROVIDER_SITE_OTHER): Payer: Medicare HMO | Admitting: General Practice

## 2016-08-05 DIAGNOSIS — Z5181 Encounter for therapeutic drug level monitoring: Secondary | ICD-10-CM

## 2016-08-05 LAB — POCT INR: INR: 3.1

## 2016-08-05 NOTE — Patient Instructions (Signed)
Pre visit review using our clinic review tool, if applicable. No additional management support is needed unless otherwise documented below in the visit note. 

## 2016-08-05 NOTE — Progress Notes (Signed)
I have reviewed and agree with the plan. 

## 2016-08-14 ENCOUNTER — Other Ambulatory Visit: Payer: Self-pay | Admitting: Family

## 2016-08-15 ENCOUNTER — Other Ambulatory Visit: Payer: Self-pay | Admitting: General Practice

## 2016-08-15 MED ORDER — WARFARIN SODIUM 5 MG PO TABS: ORAL_TABLET | ORAL | 1 refills | Status: DC

## 2016-09-16 ENCOUNTER — Ambulatory Visit (INDEPENDENT_AMBULATORY_CARE_PROVIDER_SITE_OTHER): Payer: Medicare HMO | Admitting: General Practice

## 2016-09-16 DIAGNOSIS — Z5181 Encounter for therapeutic drug level monitoring: Secondary | ICD-10-CM | POA: Diagnosis not present

## 2016-09-16 LAB — POCT INR: INR: 1.7

## 2016-09-16 NOTE — Progress Notes (Signed)
I have reviewed and agree with the plan. 

## 2016-09-16 NOTE — Patient Instructions (Signed)
Pre visit review using our clinic review tool, if applicable. No additional management support is needed unless otherwise documented below in the visit note. 

## 2016-09-17 ENCOUNTER — Other Ambulatory Visit: Payer: Self-pay | Admitting: Family

## 2016-09-17 DIAGNOSIS — I10 Essential (primary) hypertension: Secondary | ICD-10-CM

## 2016-10-15 ENCOUNTER — Other Ambulatory Visit: Payer: Self-pay | Admitting: Family

## 2016-10-20 NOTE — Patient Instructions (Signed)
Pre visit review using our clinic review tool, if applicable. No additional management support is needed unless otherwise documented below in the visit note. 

## 2016-10-21 ENCOUNTER — Ambulatory Visit (INDEPENDENT_AMBULATORY_CARE_PROVIDER_SITE_OTHER): Payer: Medicare HMO | Admitting: General Practice

## 2016-10-21 DIAGNOSIS — Z7901 Long term (current) use of anticoagulants: Secondary | ICD-10-CM | POA: Diagnosis not present

## 2016-10-21 LAB — POCT INR: INR: 2.8

## 2016-11-11 ENCOUNTER — Encounter: Payer: Self-pay | Admitting: Cardiology

## 2016-11-11 DIAGNOSIS — E785 Hyperlipidemia, unspecified: Secondary | ICD-10-CM

## 2016-11-11 DIAGNOSIS — I10 Essential (primary) hypertension: Secondary | ICD-10-CM

## 2016-11-11 DIAGNOSIS — K219 Gastro-esophageal reflux disease without esophagitis: Secondary | ICD-10-CM

## 2016-11-11 DIAGNOSIS — E668 Other obesity: Secondary | ICD-10-CM | POA: Diagnosis not present

## 2016-11-11 DIAGNOSIS — Z7901 Long term (current) use of anticoagulants: Secondary | ICD-10-CM | POA: Diagnosis not present

## 2016-11-11 DIAGNOSIS — Z9861 Coronary angioplasty status: Secondary | ICD-10-CM | POA: Diagnosis not present

## 2016-11-11 DIAGNOSIS — Z86718 Personal history of other venous thrombosis and embolism: Secondary | ICD-10-CM | POA: Diagnosis not present

## 2016-11-11 DIAGNOSIS — I2510 Atherosclerotic heart disease of native coronary artery without angina pectoris: Secondary | ICD-10-CM | POA: Diagnosis not present

## 2016-11-24 DIAGNOSIS — R69 Illness, unspecified: Secondary | ICD-10-CM | POA: Diagnosis not present

## 2016-12-02 ENCOUNTER — Ambulatory Visit: Payer: Medicare HMO | Admitting: General Practice

## 2016-12-02 DIAGNOSIS — Z7901 Long term (current) use of anticoagulants: Secondary | ICD-10-CM | POA: Diagnosis not present

## 2016-12-02 DIAGNOSIS — I82409 Acute embolism and thrombosis of unspecified deep veins of unspecified lower extremity: Secondary | ICD-10-CM | POA: Diagnosis not present

## 2016-12-02 NOTE — Patient Instructions (Signed)
Pre visit review using our clinic review tool, if applicable. No additional management support is needed unless otherwise documented below in the visit note. 

## 2016-12-27 ENCOUNTER — Other Ambulatory Visit: Payer: Self-pay

## 2016-12-27 DIAGNOSIS — I10 Essential (primary) hypertension: Secondary | ICD-10-CM

## 2016-12-27 MED ORDER — AMLODIPINE BESYLATE 5 MG PO TABS: ORAL_TABLET | ORAL | 0 refills | Status: DC

## 2017-01-03 ENCOUNTER — Other Ambulatory Visit: Payer: Self-pay | Admitting: *Deleted

## 2017-01-03 ENCOUNTER — Ambulatory Visit: Payer: Medicare HMO | Admitting: General Practice

## 2017-01-03 DIAGNOSIS — Z7901 Long term (current) use of anticoagulants: Secondary | ICD-10-CM

## 2017-01-03 LAB — POCT INR: INR: 3.4

## 2017-01-03 MED ORDER — LISINOPRIL 40 MG PO TABS: ORAL_TABLET | ORAL | 0 refills | Status: DC

## 2017-01-03 NOTE — Patient Instructions (Addendum)
Pre visit review using our clinic review tool, if applicable. No additional management support is needed unless otherwise documented below in the visit note.  Skip coumadin tomorrow (11/21) and then continue to take 1 tablet all days except 1 1/2 tablets on Thursdays.  Re-check in 4 weeks.

## 2017-01-17 ENCOUNTER — Telehealth: Payer: Self-pay | Admitting: Family Medicine

## 2017-01-17 DIAGNOSIS — R69 Illness, unspecified: Secondary | ICD-10-CM | POA: Diagnosis not present

## 2017-01-17 NOTE — Telephone Encounter (Signed)
MEDICATION: nitroGLYCERIN (NITROSTAT) 0.4 MG SL tablet  PHARMACY:   [id number] [place], [place] - [address] AT [place] [phone] (Phone) [phone] (Fax)    IS THIS A 90 DAY SUPPLY : no (unknown)  IS PATIENT OUT OF MEDICATION: yes  IF NOT; HOW MUCH IS LEFT: n/a  LAST APPOINTMENT DATE: @5 /7/18  NEXT APPOINTMENT DATE:@12 /17/2018  OTHER COMMENTS: Patient used to be on this medication with Dr. Elna Breslow. Dr. Elna Breslow is no longer practicing and he is seeing Dr. [name] next year to establish care due to being rather healthy. Patient needs this medication as soon as possible. Please advise.    **Let patient know to contact pharmacy at the end of the day to make sure medication is ready. **  ** Please notify patient to allow 48-72 hours to process**  **Encourage patient to contact the pharmacy for refills or they can request refills through [facility]**

## 2017-01-18 MED ORDER — NITROGLYCERIN 0.4 MG SL SUBL: SUBLINGUAL_TABLET | SUBLINGUAL | 1 refills | Status: DC

## 2017-01-18 NOTE — Telephone Encounter (Signed)
Medication sent in for patient. 

## 2017-01-19 ENCOUNTER — Other Ambulatory Visit: Payer: Self-pay

## 2017-01-19 MED ORDER — NITROGLYCERIN 0.4 MG SL SUBL: SUBLINGUAL_TABLET | SUBLINGUAL | 1 refills | Status: DC

## 2017-01-20 ENCOUNTER — Telehealth: Payer: Self-pay | Admitting: Family Medicine

## 2017-01-20 NOTE — Telephone Encounter (Signed)
Patient needs to schedule coumadin, but states he's unavailable Wednesdays. Please call patient to advise if possible to schedule another day.

## 2017-01-24 NOTE — Telephone Encounter (Signed)
No further action required

## 2017-01-30 ENCOUNTER — Ambulatory Visit (INDEPENDENT_AMBULATORY_CARE_PROVIDER_SITE_OTHER): Payer: Medicare HMO | Admitting: General Practice

## 2017-01-30 ENCOUNTER — Telehealth: Payer: Self-pay | Admitting: Family Medicine

## 2017-01-30 ENCOUNTER — Ambulatory Visit: Payer: Medicare HMO

## 2017-01-30 DIAGNOSIS — Z7901 Long term (current) use of anticoagulants: Secondary | ICD-10-CM | POA: Diagnosis not present

## 2017-01-30 LAB — POCT INR: INR: 2.7

## 2017-01-30 NOTE — Progress Notes (Signed)
I agree with this plan.

## 2017-01-30 NOTE — Telephone Encounter (Signed)
Patient walked in requesting an update from [place] about a billing situation which the Billing Department could not resolve. Please advise.

## 2017-01-30 NOTE — Patient Instructions (Addendum)
Pre visit review using our clinic review tool, if applicable. No additional management support is needed unless otherwise documented below in the visit note.  Continue to take 1 tablet all days except 1 1/2 tablets on Thursdays.  Re-check in 4 weeks.

## 2017-02-13 NOTE — Telephone Encounter (Signed)
I called and left a voicemail stating "to update the patient that [facility] has requested to the payer to recoup their payment. Currently, [facility] is not showing anything owed by the patient for this date of service."   I advised that everything is being taken care of and no other step is needed at this time but, to call with any other questions if needed.

## 2017-02-19 ENCOUNTER — Other Ambulatory Visit: Payer: Self-pay | Admitting: Family

## 2017-02-21 ENCOUNTER — Other Ambulatory Visit: Payer: Self-pay

## 2017-02-21 ENCOUNTER — Telehealth: Payer: Self-pay | Admitting: Family Medicine

## 2017-02-21 MED ORDER — ATENOLOL 100 MG PO TABS: 100.0000 mg | ORAL_TABLET | Freq: Every day | ORAL | 1 refills | Status: DC

## 2017-02-21 MED ORDER — WARFARIN SODIUM 5 MG PO TABS: ORAL_TABLET | ORAL | 1 refills | Status: DC

## 2017-02-21 NOTE — Telephone Encounter (Signed)
NOTE: Patient has not had their establish care visit, but their previous provider [facility]) is no longer working. Please advise.    MEDICATION: atenolol (TENORMIN) 100 MG tablet  [facility] [address] [place]  IS THIS A 90 DAY SUPPLY : yes  IS PATIENT OUT OF MEDICATION: no  IF NOT; HOW MUCH IS LEFT: 4-7 pills  LAST APPOINTMENT DATE: @12 /17/2018  NEXT APPOINTMENT DATE:@5 /09/2017  OTHER COMMENTS:    **Let patient know to contact pharmacy at the end of the day to make sure medication is ready. **  ** Please notify patient to allow 48-72 hours to process**  **Encourage patient to contact the pharmacy for refills or they can request refills through [facility]**    MEDICATION: warfarin (COUMADIN) 5 MG tablet  [facility] [address], [place]  IS THIS A 90 DAY SUPPLY : yes  IS PATIENT OUT OF MEDICATION: no  IF NOT; HOW MUCH IS LEFT: 2 pills  LAST APPOINTMENT DATE: @12 /17/2018  NEXT APPOINTMENT DATE:@5 /09/2017  OTHER COMMENTS:    **Let patient know to contact pharmacy at the end of the day to make sure medication is ready. **  ** Please notify patient to allow 48-72 hours to process**  **Encourage patient to contact the pharmacy for refills or they can request refills through [facility]**

## 2017-02-21 NOTE — Telephone Encounter (Signed)
Refills have been sent to patient's pharmacy. °

## 2017-03-30 ENCOUNTER — Other Ambulatory Visit: Payer: Self-pay | Admitting: Nurse Practitioner

## 2017-03-30 DIAGNOSIS — I10 Essential (primary) hypertension: Secondary | ICD-10-CM

## 2017-04-01 ENCOUNTER — Other Ambulatory Visit: Payer: Self-pay | Admitting: Nurse Practitioner

## 2017-04-03 ENCOUNTER — Other Ambulatory Visit: Payer: Self-pay | Admitting: Family Medicine

## 2017-04-03 DIAGNOSIS — I10 Essential (primary) hypertension: Secondary | ICD-10-CM

## 2017-04-22 ENCOUNTER — Other Ambulatory Visit: Payer: Self-pay | Admitting: Family Medicine

## 2017-05-26 ENCOUNTER — Ambulatory Visit (INDEPENDENT_AMBULATORY_CARE_PROVIDER_SITE_OTHER): Payer: Medicare HMO | Admitting: Family Medicine

## 2017-05-26 ENCOUNTER — Encounter: Payer: Self-pay | Admitting: Family Medicine

## 2017-05-26 ENCOUNTER — Telehealth: Payer: Self-pay | Admitting: *Deleted

## 2017-05-26 VITALS — BP 142/78 | HR 57 | Temp 98.4°F | Ht 71.0 in | Wt 210.0 lb

## 2017-05-26 DIAGNOSIS — M542 Cervicalgia: Secondary | ICD-10-CM

## 2017-05-26 NOTE — Telephone Encounter (Signed)
Pt walked into the office at 11:40 c/o sharp pain left side of neck started yesterday. Pain last about 3 seconds occurring every 4-5 hours. Bp:160/90, pulse: 57, O2: 95 Pt has Hx of Balloon procedure done x 2, pt also on coumadin 5 mg daily. Pt has not had coumadin checked since 01/2017. He said [name] left. I told him [name] is still here. Pt was not aware and did not think to call for coumadin check cause he said it has always been good. Asked pt if he is having any Chest pain, headaches, dizziness, SOB, numbness or tingling, slurred speech or pain down left arm. Pt denies all symptoms. Went and discussed pt with Dr. [name] he said he would see pt at 1:00 pm today. Told pt Dr. [name] would like to see him at 1:00 pm today. Pt said do you have anything after 2:00 pm? Told pt there is a 2:00 pm. Pt said that is fine. Told pt okay we will see you at 2:00 pm appointment scheduled any chest pain or other symptoms prior to visit need to go to the ED. Pt verbalized understanding. Dr. [name] notified pt coming at 2:00 pm.

## 2017-05-26 NOTE — Progress Notes (Signed)
   Subjective:  William Norman is a 77 y.o. male who presents today for same-day appointment with a chief complaint of neck pain.   HPI:  Neck Pain, Acute Issue Started yesterday.  Located in left neck.  Pain is described as a sharp sensation that lasts for 2-3 seconds and then subsides.  He has had 2 or 3 episodes of this over the past day.  No obvious triggers.  No obvious precipitating events.  No obvious alleviating or aggravating factors.  No chest pain.  No shortness of breath.  No nausea or vomiting.  ROS: Per HPI  PMH: He reports that he quit smoking about 19 years ago. His smoking use included cigarettes. He has a 60.00 pack-year smoking history. He has never used smokeless tobacco. He reports that he drinks about 4.2 oz of alcohol per week. He reports that he does not use drugs.  Objective:  Physical Exam: BP (!) 142/78 (BP Location: Left Arm, Patient Position: Sitting, Cuff Size: Normal)   Pulse (!) 57   Temp 98.4 F (36.9 C) (Oral)   Ht 5\' 11"  (1.803 m)   Wt 210 lb (95.3 kg)   SpO2 95%   BMI 29.29 kg/m   Gen: NAD, resting comfortably CV: RRR with no murmurs appreciated.  Carotid pulse 2+ bilaterally.  No carotid bruits Pulm: NWOB, CTAB with no crackles, wheezes, or rhonchi MSK:  -Neck: No deformities.  Very slight tenderness to palpation along left sternocleidomastoid.  Otherwise nontender to palpation.  Full range of motion in all directions. Skin: Warm, dry  Assessment/Plan:  Neck pain Likely secondary to muscular strain/spasm.  No red flag signs or symptoms.  Symptoms are not consistent with cardiac or pulmonary etiology.  Discussed management with patient.  We will avoid NSAIDs given his history of anticoagulation on Coumadin.  Offered starting mild muscle relaxant such as baclofen, however patient deferred.  He will use Tylenol as needed.  Also recommended use of heat to the area over the next several days.  Return precautions reviewed.  [name]. [name],  MD 05/26/2017 2:18 PM

## 2017-05-26 NOTE — Patient Instructions (Signed)
I think you have a strained muscle.  Please try using a heating pad to the area to see if this helps.  We will not make any other medication changes today.  Please schedule an appointment to have your Coumadin level checked soon.  I will see you next month, or sooner as needed.  Take care, Dr. [name]

## 2017-06-21 ENCOUNTER — Ambulatory Visit: Payer: Medicare HMO | Admitting: Family Medicine

## 2017-06-27 ENCOUNTER — Telehealth: Payer: Self-pay | Admitting: General Practice

## 2017-06-27 NOTE — Telephone Encounter (Signed)
Unable to reach patient.  LMOVM.  Patient needs coumadin clinic appointment with [name], RN.

## 2017-06-29 ENCOUNTER — Encounter: Payer: Self-pay | Admitting: Family Medicine

## 2017-06-29 ENCOUNTER — Ambulatory Visit (INDEPENDENT_AMBULATORY_CARE_PROVIDER_SITE_OTHER): Payer: Medicare HMO | Admitting: Family Medicine

## 2017-06-29 ENCOUNTER — Telehealth: Payer: Self-pay | Admitting: Family Medicine

## 2017-06-29 VITALS — BP 110/68 | HR 62 | Temp 98.4°F | Resp 16 | Ht 72.0 in | Wt 208.0 lb

## 2017-06-29 DIAGNOSIS — I10 Essential (primary) hypertension: Secondary | ICD-10-CM | POA: Diagnosis not present

## 2017-06-29 DIAGNOSIS — R739 Hyperglycemia, unspecified: Secondary | ICD-10-CM | POA: Diagnosis not present

## 2017-06-29 DIAGNOSIS — Z7901 Long term (current) use of anticoagulants: Secondary | ICD-10-CM | POA: Diagnosis not present

## 2017-06-29 DIAGNOSIS — Z0001 Encounter for general adult medical examination with abnormal findings: Secondary | ICD-10-CM

## 2017-06-29 DIAGNOSIS — I2510 Atherosclerotic heart disease of native coronary artery without angina pectoris: Secondary | ICD-10-CM

## 2017-06-29 DIAGNOSIS — E785 Hyperlipidemia, unspecified: Secondary | ICD-10-CM | POA: Diagnosis not present

## 2017-06-29 LAB — LIPID PANEL
Cholesterol: 146 mg/dL (ref 0–200)
HDL: 47.4 mg/dL (ref >39.00)
LDL Cholesterol: 80 mg/dL (ref 0–99)
NonHDL: 99.06
Total CHOL/HDL Ratio: 3
Triglycerides: 93 mg/dL (ref 0.0–149.0)
VLDL: 18.6 mg/dL (ref 0.0–40.0)

## 2017-06-29 LAB — PROTIME-INR
INR: 2.6 ratio — ABNORMAL HIGH (ref 0.8–1.0)
Prothrombin Time: 30.3 s — ABNORMAL HIGH (ref 9.6–13.1)

## 2017-06-29 LAB — CBC
HCT: 44.2 % (ref 39.0–52.0)
Hemoglobin: 14.6 g/dL (ref 13.0–17.0)
MCHC: 33.1 g/dL (ref 30.0–36.0)
MCV: 87.8 fl (ref 78.0–100.0)
Platelets: 219 10*3/uL (ref 150.0–400.0)
RBC: 5.03 Mil/uL (ref 4.22–5.81)
RDW: 14.9 % (ref 11.5–15.5)
WBC: 5.3 10*3/uL (ref 4.0–10.5)

## 2017-06-29 LAB — COMPREHENSIVE METABOLIC PANEL
ALT: 19 U/L (ref 0–53)
AST: 26 U/L (ref 0–37)
Albumin: 3.9 g/dL (ref 3.5–5.2)
Alkaline Phosphatase: 45 U/L (ref 39–117)
BUN: 17 mg/dL (ref 6–23)
CO2: 28 mEq/L (ref 19–32)
Calcium: 9.5 mg/dL (ref 8.4–10.5)
Chloride: 106 mEq/L (ref 96–112)
Creatinine, Ser: 0.82 mg/dL (ref 0.40–1.50)
GFR: 96.7 mL/min (ref >60.00)
Glucose, Bld: 97 mg/dL (ref 70–99)
Potassium: 5 mEq/L (ref 3.5–5.1)
Sodium: 138 mEq/L (ref 135–145)
Total Bilirubin: 0.7 mg/dL (ref 0.2–1.2)
Total Protein: 6.8 g/dL (ref 6.0–8.3)

## 2017-06-29 LAB — TSH: TSH: 2.5 u[IU]/mL (ref 0.35–4.50)

## 2017-06-29 LAB — HEMOGLOBIN A1C: Hgb A1c MFr Bld: 5.7 % (ref 4.6–6.5)

## 2017-06-29 NOTE — Assessment & Plan Note (Addendum)
At goal on amlodipine, atenolol, and lisinopril.  We will continue these medications.  Check CBC, CMET, and TSH.

## 2017-06-29 NOTE — Telephone Encounter (Signed)
See note

## 2017-06-29 NOTE — Patient Instructions (Signed)
It was very nice to see you today!  No med changes today.  Please look into if your insurance would cover either xarelto or eliquis. This could be good alternatives to warfarin.  It is ok for you to stop the niacin.  Please come back soon for your medicare annual wellness visit.  I would like to see you back in a year for your next physical. Please come back to see [place] sooner as needed.  Take care, Dr [name] Preventive Care 78 Years and Older, Male Preventive care refers to lifestyle choices and visits with your health care provider that can promote health and wellness. What does preventive care include?  A yearly physical exam. This is also called an annual well check.  Dental exams once or twice a year.  Routine eye exams. Ask your health care provider how often you should have your eyes checked.  Personal lifestyle choices, including: ? Daily care of your teeth and gums. ? Regular physical activity. ? Eating a healthy diet. ? Avoiding tobacco and drug use. ? Limiting alcohol use. ? Practicing safe sex. ? Taking low doses of aspirin every day. ? Taking vitamin and mineral supplements as recommended by your health care provider. What happens during an annual well check? The services and screenings done by your health care provider during your annual well check will depend on your age, overall health, lifestyle risk factors, and family history of disease. Counseling Your health care provider may ask you questions about your:  Alcohol use.  Tobacco use.  Drug use.  Emotional well-being.  Home and relationship well-being.  Sexual activity.  Eating habits.  History of falls.  Memory and ability to understand (cognition).  Work and work Statistician.  Screening You may have the following tests or measurements:  Height, weight, and BMI.  Blood pressure.  Lipid and cholesterol levels. These may be checked every 5 years, or more frequently if you are over 48  years old.  Skin check.  Lung cancer screening. You may have this screening every year starting at age 19 if you have a 30-pack-year history of smoking and currently smoke or have quit within the past 15 years.  Fecal occult blood test (FOBT) of the stool. You may have this test every year starting at age 42.  Flexible sigmoidoscopy or colonoscopy. You may have a sigmoidoscopy every 5 years or a colonoscopy every 10 years starting at age 64.  Prostate cancer screening. Recommendations will vary depending on your family history and other risks.  Hepatitis C blood test.  Hepatitis B blood test.  Sexually transmitted disease (STD) testing.  Diabetes screening. This is done by checking your blood sugar (glucose) after you have not eaten for a while (fasting). You may have this done every 1-3 years.  Abdominal aortic aneurysm (AAA) screening. You may need this if you are a current or former smoker.  Osteoporosis. You may be screened starting at age 15 if you are at high risk.  Talk with your health care provider about your test results, treatment options, and if necessary, the need for more tests. Vaccines Your health care provider may recommend certain vaccines, such as:  Influenza vaccine. This is recommended every year.  Tetanus, diphtheria, and acellular pertussis (Tdap, Td) vaccine. You may need a Td booster every 10 years.  Varicella vaccine. You may need this if you have not been vaccinated.  Zoster vaccine. You may need this after age 70.  Measles, mumps, and rubella (MMR) vaccine. You  may need at least one dose of MMR if you were born in 1957 or later. You may also need a second dose.  Pneumococcal 13-valent conjugate (PCV13) vaccine. One dose is recommended after age 52.  Pneumococcal polysaccharide (PPSV23) vaccine. One dose is recommended after age 40.  Meningococcal vaccine. You may need this if you have certain conditions.  Hepatitis A vaccine. You may need this  if you have certain conditions or if you travel or work in places where you may be exposed to hepatitis A.  Hepatitis B vaccine. You may need this if you have certain conditions or if you travel or work in places where you may be exposed to hepatitis B.  Haemophilus influenzae type b (Hib) vaccine. You may need this if you have certain risk factors.  Talk to your health care provider about which screenings and vaccines you need and how often you need them. This information is not intended to replace advice given to you by your health care provider. Make sure you discuss any questions you have with your health care provider. Document Released: 02/27/2015 Document Revised: 10/21/2015 Document Reviewed: 12/02/2014 Elsevier Interactive Patient Education  [employer].

## 2017-06-29 NOTE — Progress Notes (Signed)
Subjective:  William Norman is a 77 y.o. male who presents today for his annual comprehensive physical exam.    HPI:  He has no acute complaints today.   Lifestyle Diet: Tries to eat a balanced diet.  Exercise: Does a lot of yard work.   Depression screen [facility] 2/9 06/29/2017  Decreased Interest 0  Down, Depressed, Hopeless 0  PHQ - 2 Score 0   ROS: Positive for light sensitive eyes, cough, leg swelling, increased urination, difficulty urinating, pain with urination, urinary frequency, urinary urgency, otherwise a complete review of systems was negative.   PMH:  The following were reviewed and entered/updated in epic: Past Medical History:  Diagnosis Date  . BPH (benign prostatic hypertrophy)   . CAD (coronary artery disease)   . DVT (deep venous thrombosis) ([place])   . Eczema   . GERD (gastroesophageal reflux disease)   . Hyperlipidemia   . Hypertension    Patient Active Problem List   Diagnosis Date Noted  . Long term (current) use of anticoagulants 12/02/2016  . Chronic dermatitis of hands 10/12/2015  . Soft tissue lesion of foot 08/27/2015  . Overweight (BMI 25.0-29.9) 06/18/2015  . Abdominal pain 03/21/2014  . SBO (small bowel obstruction) ([place]) 03/21/2014  . BPH associated with nocturia 08/29/2013  . CAD (coronary artery disease), native coronary artery   . Long term current use of anticoagulant therapy   . Personal history of DVT (deep vein thrombosis)   . Chronic venous insufficiency   . Erectile dysfunction 07/11/2012  . Lumbar disc disease   . GERD 08/04/2006  . Dyslipidemia   . Essential hypertension    Past Surgical History:  Procedure Laterality Date  . CARPAL TUNNEL RELEASE  04/21/2011   Procedure: CARPAL TUNNEL RELEASE;  Surgeon: Tennis Must, MD;  Location: [facility];  Service: Orthopedics;  Laterality: Left;  . CARPAL TUNNEL RELEASE  05/23/2011   Procedure: CARPAL TUNNEL RELEASE;  Surgeon: Tennis Must, MD;  Location: [place];  Service: Orthopedics;  Laterality: Right;  . COLON SURGERY  2010 and 2011 colostomy reversal  . precutaneous transluminal coronary angioplasty    . right hernia    . TONSILLECTOMY AND ADENOIDECTOMY      Family History  Problem Relation Age of Onset  . Heart disease Father     Medications- reviewed and updated Current Outpatient Medications  Medication Sig Dispense Refill  . amLODipine (NORVASC) 5 MG tablet TAKE 1 TABLET(5 MG) BY MOUTH DAILY 90 tablet 0  . atenolol (TENORMIN) 100 MG tablet Take 1 tablet (100 mg total) by mouth daily. 90 tablet 1  . atorvastatin (LIPITOR) 40 MG tablet TAKE 1 TABLET(40 MG) BY MOUTH DAILY 90 tablet 1  . Coenzyme Q10 (COQ10) 200 MG CAPS Take by mouth.    . fluocinonide cream (LIDEX) 8.24 % Apply 1 application topically 2 (two) times daily. 120 g 0  . hydroxypropyl methylcellulose / hypromellose (ISOPTO TEARS / GONIOVISC) 2.5 % ophthalmic solution Place 1 drop into both eyes 4 (four) times daily as needed for dry eyes.    Marland Kitchen lisinopril (PRINIVIL,ZESTRIL) 40 MG tablet TAKE 1 TABLET(40 MG) BY MOUTH DAILY *KEEP APPT FOR MORE REFILLS** 90 tablet 0  . nitroGLYCERIN (NITROSTAT) 0.4 MG SL tablet Dissolve 1 tablet under the tongue every 5 minutes as needed 90 tablet 1  . warfarin (COUMADIN) 5 MG tablet TAKE AS DIRECTED BY ANTICOAGULATION CLINIC 90 tablet 1   No current facility-administered medications for this visit.  Allergies-reviewed and updated Allergies  Allergen Reactions  . Other Other (See Comments)    Some form of numbing medication used by dentist, almost died    Social History   Socioeconomic History  . Marital status: Married    Spouse name: Not on file  . Number of children: 3  . Years of education: 33  . Highest education level: Not on file  Occupational History  . Occupation: Retired  Scientific laboratory technician  . Financial resource strain: Not on file  . Food insecurity:    Worry: Not on file    Inability: Not on file  .  Transportation needs:    Medical: Not on file    Non-medical: Not on file  Tobacco Use  . Smoking status: Former Smoker    Packs/day: 1.00    Years: 60.00    Pack years: 60.00    Types: Cigarettes    Last attempt to quit: 03/25/1998    Years since quitting: 19.2  . Smokeless tobacco: Never Used  Substance and Sexual Activity  . Alcohol use: Yes    Alcohol/week: 4.2 oz    Types: 7 Glasses of wine per week  . Drug use: No  . Sexual activity: Not on file  Lifestyle  . Physical activity:    Days per week: Not on file    Minutes per session: Not on file  . Stress: Not on file  Relationships  . Social connections:    Talks on phone: Not on file    Gets together: Not on file    Attends religious service: Not on file    Active member of club or organization: Not on file    Attends meetings of clubs or organizations: Not on file    Relationship status: Not on file  Other Topics Concern  . Not on file  Social History Narrative   Fun: Working outside in yard, cutting wood    Denies abuse and feels safe at home.    Objective:  Physical Exam: BP 110/68   Pulse 62   Temp 98.4 F (36.9 C) (Oral)   Resp 16   Ht 6' (1.829 m)   Wt 208 lb (94.3 kg)   SpO2 96%   BMI 28.21 kg/m   Body mass index is 28.21 kg/m. Wt Readings from Last 3 Encounters:  06/29/17 208 lb (94.3 kg)  05/26/17 210 lb (95.3 kg)  06/20/16 209 lb 12.8 oz (95.2 kg)   Gen: NAD, resting comfortably HEENT: TMs normal bilaterally. OP clear. No thyromegaly noted.  CV: RRR with no murmurs appreciated Pulm: NWOB, CTAB with no crackles, wheezes, or rhonchi GI: Normal bowel sounds present. Soft, Nontender, Nondistended. MSK: no edema, cyanosis, or clubbing noted Skin: warm, dry Neuro: CN2-12 grossly intact. Strength 5/5 in upper and lower extremities. Reflexes symmetric and intact bilaterally.  Psych: Normal affect and thought content  Assessment/Plan:  Essential hypertension At goal on amlodipine, atenolol, and  lisinopril.  We will continue these medications.  Check CBC, CMET, and TSH.  Dyslipidemia Continue Lipitor 40 mg daily.  Check lipid panel.  Stop niacin.  Long term current use of anticoagulant therapy Check INR today.  Patient will look alternative anticoagulation including Xarelto and Eliquis to see if his insurance will cover this.  Hyperglycemia Check A1c today.  Preventative Healthcare: Up-to-date on vaccinations and screenings.  Patient Counseling(The following topics were reviewed and/or handout was given):  -Nutrition: Stressed importance of moderation in sodium/caffeine intake, saturated fat and cholesterol, caloric balance, sufficient intake  of fresh fruits, vegetables, and fiber.  -Stressed the importance of regular exercise.   -Substance Abuse: Discussed cessation/primary prevention of tobacco, alcohol, or other drug use; driving or other dangerous activities under the influence; availability of treatment for abuse.   -Injury prevention: Discussed safety belts, safety helmets, smoke detector, smoking near bedding or upholstery.   -Sexuality: Discussed sexually transmitted diseases, partner selection, use of condoms, avoidance of unintended pregnancy and contraceptive alternatives.   -Dental health: Discussed importance of regular tooth brushing, flossing, and dental visits.  -Health maintenance and immunizations reviewed. Please refer to Health maintenance section.  Return to care in 1 year for next preventative visit.   [name]. [name], MD 06/29/2017 11:57 AM

## 2017-06-29 NOTE — Assessment & Plan Note (Signed)
Continue Lipitor 40 mg daily.  Check lipid panel.  Stop niacin.

## 2017-06-29 NOTE — Telephone Encounter (Signed)
Copied from [name] [id number]. Topic: Quick Communication - Rx Refill/Question >> Jun 29, 2017  2:10 PM William Norman wrote: Medication: Dr. [name] noted: Please look into if your insurance would cover either xarelto or eliquis. This could be good alternatives to warfarin. (patient needs to know what the dosage would be on either of these to find out how much it would cost at his pharamcy.) Has the patient contacted their pharmacy? Yes.   (Agent: If no, request that the patient contact the pharmacy for the refill.) Preferred Pharmacy (with phone number or street name): [id number] [address], [place] [address] Phone: [phone] Fax: [phone] Agent: Please be advised that RX refills may take up to 3 business days. We ask that you follow-up with your pharmacy.

## 2017-06-29 NOTE — Telephone Encounter (Signed)
Please add dosages to the meds for pt. For insurance

## 2017-06-29 NOTE — Assessment & Plan Note (Signed)
Check INR today.  Patient will look alternative anticoagulation including Xarelto and Eliquis to see if his insurance will cover this.

## 2017-06-30 NOTE — Telephone Encounter (Signed)
Rivaroxaban 15 mg by mouth twice daily for 21 days followed by 20 mg once daily Apixaban 10 mg twice daily for seven days followed by 5 mg twice daily

## 2017-06-30 NOTE — Telephone Encounter (Signed)
LM for patient to return call.

## 2017-07-03 NOTE — Progress Notes (Signed)
Dr Marigene Ehlers interpretation of your lab work:  Your blood work is all NORMAL. We do not need to make any changes to your treatment plan at this time.    If you have any additional questions, please give [place] a call or send [place] a message through [employer].  Take care, Dr [name]

## 2017-07-04 ENCOUNTER — Encounter: Payer: Self-pay | Admitting: Family Medicine

## 2017-07-04 NOTE — Telephone Encounter (Signed)
Sent note through [employer]

## 2017-07-13 ENCOUNTER — Encounter: Payer: Self-pay | Admitting: *Deleted

## 2017-07-13 ENCOUNTER — Other Ambulatory Visit: Payer: Self-pay | Admitting: Family Medicine

## 2017-07-13 ENCOUNTER — Ambulatory Visit (INDEPENDENT_AMBULATORY_CARE_PROVIDER_SITE_OTHER): Payer: Medicare HMO | Admitting: *Deleted

## 2017-07-13 VITALS — BP 120/70 | HR 60 | Ht 72.0 in | Wt 211.0 lb

## 2017-07-13 DIAGNOSIS — Z0000 Encounter for general adult medical examination without abnormal findings: Secondary | ICD-10-CM | POA: Diagnosis not present

## 2017-07-13 DIAGNOSIS — I10 Essential (primary) hypertension: Secondary | ICD-10-CM

## 2017-07-13 NOTE — Progress Notes (Signed)
I have personally reviewed the Medicare Annual Wellness Visit and agree with the assessment and plan.  William Norman. [name], MD 07/13/2017 4:34 PM

## 2017-07-13 NOTE — Patient Instructions (Addendum)
William Norman , Thank you for taking time to come for your Medicare Wellness Visit. I appreciate your ongoing commitment to your health goals. Please review the following plan we discussed and let me know if I can assist you in the future.   Please make an apt with Dr. Gershon Crane this year   Will fup with [name] on Monday at 1:15 for your INR check States he had not had his INR checks in awhile  You had a zoster in Jan of 2012 Now we have 2  New vaccines called the shingirx, Shingrix is a vaccine for the prevention of Shingles in Adults 50 and older.  If you are on Medicare, the shingrix is covered under your Part D plan, so you will take both of the vaccines in the series at your pharmacy. Please check with your benefits regarding applicable copays or out of pocket expenses.  The Shingrix is given in 2 vaccines approx 8 weeks apart. You must receive the 2nd dose prior to 6 months from receipt of the first. Please have the pharmacist print out you Immunization  dates for our office records   You were tested for pre-diabetes and your A1c was 5.7  5.8 indicates very mild pre-diabetes and fasting glucose was normal   These are the goals we discussed: Goals    . patient (pt-stated)     Wants to continue to stay active to enjoy life        This is a list of the screening recommended for you and due dates:  Health Maintenance  Topic Date Due  . Flu Shot  09/14/2017  . Tetanus Vaccine  10/20/2024  . Pneumonia vaccines  Completed      Fall Prevention in the Home Falls can cause injuries. They can happen to people of all ages. There are many things you can do to make your home safe and to help prevent falls. What can I do on the outside of my home?  Regularly fix the edges of walkways and driveways and fix any cracks.  Remove anything that might make you trip as you walk through a door, such as a raised step or threshold.  Trim any bushes or trees on the path to your home.  Use  bright outdoor lighting.  Clear any walking paths of anything that might make someone trip, such as rocks or tools.  Regularly check to see if handrails are loose or broken. Make sure that both sides of any steps have handrails.  Any raised decks and porches should have guardrails on the edges.  Have any leaves, snow, or ice cleared regularly.  Use sand or salt on walking paths during winter.  Clean up any spills in your garage right away. This includes oil or grease spills. What can I do in the bathroom?  Use night lights.  Install grab bars by the toilet and in the tub and shower. Do not use towel bars as grab bars.  Use non-skid mats or decals in the tub or shower.  If you need to sit down in the shower, use a plastic, non-slip stool.  Keep the floor dry. Clean up any water that spills on the floor as soon as it happens.  Remove soap buildup in the tub or shower regularly.  Attach bath mats securely with double-sided non-slip rug tape.  Do not have throw rugs and other things on the floor that can make you trip. What can I do in the bedroom?  Use night  lights.  Make sure that you have a light by your bed that is easy to reach.  Do not use any sheets or blankets that are too big for your bed. They should not hang down onto the floor.  Have a firm chair that has side arms. You can use this for support while you get dressed.  Do not have throw rugs and other things on the floor that can make you trip. What can I do in the kitchen?  Clean up any spills right away.  Avoid walking on wet floors.  Keep items that you use a lot in easy-to-reach places.  If you need to reach something above you, use a strong step stool that has a grab bar.  Keep electrical cords out of the way.  Do not use floor polish or wax that makes floors slippery. If you must use wax, use non-skid floor wax.  Do not have throw rugs and other things on the floor that can make you trip. What can  I do with my stairs?  Do not leave any items on the stairs.  Make sure that there are handrails on both sides of the stairs and use them. Fix handrails that are broken or loose. Make sure that handrails are as long as the stairways.  Check any carpeting to make sure that it is firmly attached to the stairs. Fix any carpet that is loose or worn.  Avoid having throw rugs at the top or bottom of the stairs. If you do have throw rugs, attach them to the floor with carpet tape.  Make sure that you have a light switch at the top of the stairs and the bottom of the stairs. If you do not have them, ask someone to add them for you. What else can I do to help prevent falls?  Wear shoes that: ? Do not have high heels. ? Have rubber bottoms. ? Are comfortable and fit you well. ? Are closed at the toe. Do not wear sandals.  If you use a stepladder: ? Make sure that it is fully opened. Do not climb a closed stepladder. ? Make sure that both sides of the stepladder are locked into place. ? Ask someone to hold it for you, if possible.  Clearly mark and make sure that you can see: ? Any grab bars or handrails. ? First and last steps. ? Where the edge of each step is.  Use tools that help you move around (mobility aids) if they are needed. These include: ? Canes. ? Walkers. ? Scooters. ? Crutches.  Turn on the lights when you go into a dark area. Replace any light bulbs as soon as they burn out.  Set up your furniture so you have a clear path. Avoid moving your furniture around.  If any of your floors are uneven, fix them.  If there are any pets around you, be aware of where they are.  Review your medicines with your doctor. Some medicines can make you feel dizzy. This can increase your chance of falling. Ask your doctor what other things that you can do to help prevent falls. This information is not intended to replace advice given to you by your health care provider. Make sure you  discuss any questions you have with your health care provider. Document Released: 11/27/2008 Document Revised: 07/09/2015 Document Reviewed: 03/07/2014 Elsevier Interactive Patient Education  2018 [place] Maintenance, Male A healthy lifestyle and preventive care is important for  your health and wellness. Ask your health care provider about what schedule of regular examinations is right for you. What should I know about weight and diet? Eat a Healthy Diet  Eat plenty of vegetables, fruits, whole grains, low-fat dairy products, and lean protein.  Do not eat a lot of foods high in solid fats, added sugars, or salt.  Maintain a Healthy Weight Regular exercise can help you achieve or maintain a healthy weight. You should:  Do at least 150 minutes of exercise each week. The exercise should increase your heart rate and make you sweat (moderate-intensity exercise).  Do strength-training exercises at least twice a week.  Watch Your Levels of Cholesterol and Blood Lipids  Have your blood tested for lipids and cholesterol every 5 years starting at 78 years of age. If you are at high risk for heart disease, you should start having your blood tested when you are 78 years old. You may need to have your cholesterol levels checked more often if: ? Your lipid or cholesterol levels are high. ? You are older than 78 years of age. ? You are at high risk for heart disease.  What should I know about cancer screening? Many types of cancers can be detected early and may often be prevented. Lung Cancer  You should be screened every year for lung cancer if: ? You are a current smoker who has smoked for at least 30 years. ? You are a former smoker who has quit within the past 15 years.  Talk to your health care provider about your screening options, when you should start screening, and how often you should be screened.  Colorectal Cancer  Routine colorectal cancer screening usually  begins at 78 years of age and should be repeated every 5-10 years until you are 78 years old. You may need to be screened more often if early forms of precancerous polyps or small growths are found. Your health care provider may recommend screening at an earlier age if you have risk factors for colon cancer.  Your health care provider may recommend using home test kits to check for hidden blood in the stool.  A small camera at the end of a tube can be used to examine your colon (sigmoidoscopy or colonoscopy). This checks for the earliest forms of colorectal cancer.  Prostate and Testicular Cancer  Depending on your age and overall health, your health care provider may do certain tests to screen for prostate and testicular cancer.  Talk to your health care provider about any symptoms or concerns you have about testicular or prostate cancer.  Skin Cancer  Check your skin from head to toe regularly.  Tell your health care provider about any new moles or changes in moles, especially if: ? There is a change in a mole's size, shape, or color. ? You have a mole that is larger than a pencil eraser.  Always use sunscreen. Apply sunscreen liberally and repeat throughout the day.  Protect yourself by wearing long sleeves, pants, a wide-brimmed hat, and sunglasses when outside.  What should I know about heart disease, diabetes, and high blood pressure?  If you are 41-33 years of age, have your blood pressure checked every 3-5 years. If you are 64 years of age or older, have your blood pressure checked every year. You should have your blood pressure measured twice-once when you are at a hospital or clinic, and once when you are not at a hospital or clinic. Record the average of  the two measurements. To check your blood pressure when you are not at a hospital or clinic, you can use: ? An automated blood pressure machine at a pharmacy. ? A home blood pressure monitor.  Talk to your health care  provider about your target blood pressure.  If you are between 46-65 years old, ask your health care provider if you should take aspirin to prevent heart disease.  Have regular diabetes screenings by checking your fasting blood sugar level. ? If you are at a normal weight and have a low risk for diabetes, have this test once every three years after the age of 57. ? If you are overweight and have a high risk for diabetes, consider being tested at a younger age or more often.  A one-time screening for abdominal aortic aneurysm (AAA) by ultrasound is recommended for men aged 4-75 years who are current or former smokers. What should I know about preventing infection? Hepatitis B If you have a higher risk for hepatitis B, you should be screened for this virus. Talk with your health care provider to find out if you are at risk for hepatitis B infection. Hepatitis C Blood testing is recommended for:  Everyone born from [birth year] through 1966.  Anyone with known risk factors for hepatitis C.  Sexually Transmitted Diseases (STDs)  You should be screened each year for STDs including gonorrhea and chlamydia if: ? You are sexually active and are younger than 78 years of age. ? You are older than 78 years of age and your health care provider tells you that you are at risk for this type of infection. ? Your sexual activity has changed since you were last screened and you are at an increased risk for chlamydia or gonorrhea. Ask your health care provider if you are at risk.  Talk with your health care provider about whether you are at high risk of being infected with HIV. Your health care provider may recommend a prescription medicine to help prevent HIV infection.  What else can I do?  Schedule regular health, dental, and eye exams.  Stay current with your vaccines (immunizations).  Do not use any tobacco products, such as cigarettes, chewing tobacco, and e-cigarettes. If you need help quitting, ask  your health care provider.  Limit alcohol intake to no more than 2 drinks per day. One drink equals 12 ounces of beer, 5 ounces of wine, or 1 ounces of hard liquor.  Do not use street drugs.  Do not share needles.  Ask your health care provider for help if you need support or information about quitting drugs.  Tell your health care provider if you often feel depressed.  Tell your health care provider if you have ever been abused or do not feel safe at home. This information is not intended to replace advice given to you by your health care provider. Make sure you discuss any questions you have with your health care provider. Document Released: 07/30/2007 Document Revised: 09/30/2015 Document Reviewed: 11/04/2014 Elsevier Interactive Patient Education  [employer].

## 2017-07-13 NOTE — Progress Notes (Signed)
Subjective:   Wilgus Deyton is a 77 y.o. male who presents for Medicare Annual/Subsequent preventive examination.  Reports health as good Colectomy 2010 with colostomy reversal in 2011   Lives with wife in [place] in 2 level home Basement unfinished but stays down there in the summer   Typical day; Leaves in the am and goes to [place] to eat and to [place] home and then works inside or outside  Wife is in good health Children:  Sherren Mocha- has one dtr; Jeani Hawking she has 2 dtr; Merry Proud is a  Agricultural consultant and currently lives with them   Diet BMI 28  Lipids chol/hdl ratio 3 Trig 93 (A1c 5.7 2019) Educated regarding pre-diabetes  Exercise Works in the yard On his feet during the day    ETOH q day x 1 glass Tobacco 60 pack hx and quit 2000;   There are no preventive care reminders to display for this patient.  No more colonoscopy's planned at this time  PSA 06/2016    Objective:    Vitals: BP 120/70   Pulse 60   Ht 6' (1.829 m)   Wt 211 lb (95.7 kg)   SpO2 94%   BMI 28.62 kg/m   Body mass index is 28.62 kg/m.  Advanced Directives 07/13/2017 03/21/2014 05/19/2011  Does Patient Have a Medical Advance Directive? No No Patient does not have advance directive   Declined information  Tobacco Social History   Tobacco Use  Smoking Status Former Smoker  . Packs/day: 1.00  . Years: 60.00  . Pack years: 60.00  . Types: Cigarettes  . Last attempt to quit: 03/25/1998  . Years since quitting: 19.3  Smokeless Tobacco Never Used     Counseling given: Yes   Clinical Intake:     Past Medical History:  Diagnosis Date  . BPH (benign prostatic hypertrophy)   . CAD (coronary artery disease)   . DVT (deep venous thrombosis) ([facility])   . Eczema   . GERD (gastroesophageal reflux disease)   . Hyperlipidemia   . Hypertension    Past Surgical History:  Procedure Laterality Date  . CARPAL TUNNEL RELEASE  04/21/2011   Procedure: CARPAL TUNNEL RELEASE;  Surgeon: Tennis Must, MD;  Location: [facility];  Service: Orthopedics;  Laterality: Left;  . CARPAL TUNNEL RELEASE  05/23/2011   Procedure: CARPAL TUNNEL RELEASE;  Surgeon: Tennis Must, MD;  Location: [facility];  Service: Orthopedics;  Laterality: Right;  . COLON SURGERY  2010 and 2011 colostomy reversal  . precutaneous transluminal coronary angioplasty    . right hernia    . TONSILLECTOMY AND ADENOIDECTOMY     Family History  Problem Relation Age of Onset  . Heart disease Father    Social History   Socioeconomic History  . Marital status: Married    Spouse name: Not on file  . Number of children: 3  . Years of education: 9  . Highest education level: Not on file  Occupational History  . Occupation: Retired  Scientific laboratory technician  . Financial resource strain: Not on file  . Food insecurity:    Worry: Not on file    Inability: Not on file  . Transportation needs:    Medical: Not on file    Non-medical: Not on file  Tobacco Use  . Smoking status: Former Smoker    Packs/day: 1.00    Years: 60.00    Pack years: 60.00    Types: Cigarettes  Last attempt to quit: 03/25/1998    Years since quitting: 19.3  . Smokeless tobacco: Never Used  Substance and Sexual Activity  . Alcohol use: Yes    Alcohol/week: 4.2 oz    Types: 7 Glasses of wine per week    Comment: maybe 2 a week  . Drug use: No  . Sexual activity: Not on file  Lifestyle  . Physical activity:    Days per week: Not on file    Minutes per session: Not on file  . Stress: Not on file  Relationships  . Social connections:    Talks on phone: Not on file    Gets together: Not on file    Attends religious service: Not on file    Active member of club or organization: Not on file    Attends meetings of clubs or organizations: Not on file    Relationship status: Not on file  Other Topics Concern  . Not on file  Social History Narrative   Fun: Working outside in yard, cutting wood    Denies abuse and  feels safe at home.     Outpatient Encounter Medications as of 07/13/2017  Medication Sig  . amLODipine (NORVASC) 5 MG tablet TAKE 1 TABLET(5 MG) BY MOUTH DAILY  . atenolol (TENORMIN) 100 MG tablet Take 1 tablet (100 mg total) by mouth daily.  Marland Kitchen atorvastatin (LIPITOR) 40 MG tablet TAKE 1 TABLET(40 MG) BY MOUTH DAILY  . Coenzyme Q10 (COQ10) 200 MG CAPS Take by mouth.  . fluocinonide cream (LIDEX) 0.63 % Apply 1 application topically 2 (two) times daily.  . hydroxypropyl methylcellulose / hypromellose (ISOPTO TEARS / GONIOVISC) 2.5 % ophthalmic solution Place 1 drop into both eyes 4 (four) times daily as needed for dry eyes.  Marland Kitchen lisinopril (PRINIVIL,ZESTRIL) 40 MG tablet TAKE 1 TABLET(40 MG) BY MOUTH DAILY *KEEP APPT FOR MORE REFILLS**  . nitroGLYCERIN (NITROSTAT) 0.4 MG SL tablet Dissolve 1 tablet under the tongue every 5 minutes as needed  . warfarin (COUMADIN) 5 MG tablet TAKE AS DIRECTED BY ANTICOAGULATION CLINIC   No facility-administered encounter medications on file as of 07/13/2017.     Activities of Daily Living In your present state of health, do you have any difficulty performing the following activities: 07/13/2017  Hearing? N  Vision? N  Difficulty concentrating or making decisions? N  Walking or climbing stairs? N  Dressing or bathing? N  Doing errands, shopping? N  Preparing Food and eating ? N  Using the Toilet? N  In the past six months, have you accidently leaked urine? N  Comment does have freq urination; sometimes painful;   Do you have problems with loss of bowel control? N  Managing your Medications? N  Managing your Finances? N  Housekeeping or managing your Housekeeping? N  Some recent data might be hidden    Patient Care Team: Vivi Barrack, MD as PCP - General (Family Medicine) Jacolyn Reedy, MD as Consulting Physician (Cardiology)   Assessment:   This is a routine wellness examination for Zeph.  Exercise Activities and Dietary  recommendations Current Exercise Habits: Home exercise routine, Type of exercise: walking(physical work ), Time (Minutes): 60, Frequency (Times/Week): 3, Weekly Exercise (Minutes/Week): 180  Goals    . patient (pt-stated)     Wants to continue to stay active to enjoy life        Fall Risk Fall Risk  07/13/2017 06/29/2017 06/20/2016 06/18/2015 06/09/2015  Falls in the past year? No No No No  No     Depression Screen PHQ 2/9 Scores 07/13/2017 06/29/2017 06/20/2016 06/18/2015  PHQ - 2 Score 0 0 0 0    Cognitive Function MMSE - Mini Mental State Exam 07/13/2017 06/09/2015  Not completed: (No Data) (No Data)   Ad8 score reviewed for issues:  Issues making decisions:  Less interest in hobbies / activities:  Repeats questions, stories (family complaining):  Trouble using ordinary gadgets (microwave, computer, phone):  Forgets the month or year:   Mismanaging finances:   Remembering appts:  Daily problems with thinking and/or memory: Ad8 score is=0       6CIT Screen 07/13/2017  What Year? 0 points  What month? 0 points  What time? 0 points  Count back from 20 0 points  Months in reverse 0 points  Repeat phrase 0 points  Total Score 0    Immunization History  Administered Date(s) Administered  . Influenza Split 11/16/2012  . Influenza Whole 02/14/2005, 11/20/2007, 11/14/2009  . Influenza, High Dose Seasonal PF 10/21/2014, 10/12/2015  . Influenza,inj,Quad PF,6+ Mos 10/10/2013  . Pneumococcal Conjugate-13 10/21/2014  . Pneumococcal Polysaccharide-23 02/15/2003, 02/18/2008  . Td 02/15/2004, 10/21/2014  . Zoster 03/09/2010     Screening Tests Health Maintenance  Topic Date Due  . INFLUENZA VACCINE  09/14/2017  . TETANUS/TDAP  10/20/2024  . PNA vac Low Risk Adult  Completed   Cancer Screenings: Lung: Low Dose CT Chest recommended if Age 53-80 years, 30 pack-year currently smoking OR have quit w/in 15years. Patient does not qualify as he quit in 2000;  AAA /had CT of  the abd 04-26-14  States his brother died from AAA  Colorectal: hx of colectomy with reversal of colostomy PSA in 2018         Plan:     PCP Notes   Health Maintenance Will schedule an eye exam with Dr. Dominga Ferry regarding shingrix   Abnormal Screens  none  Referrals  States he has not had INR in awhile; referred to [facility] and apt made for Monday at 1:15; He is thinking of transitioning to Eliquis and will see how much it cost; cindy will help with the transition if he decides to switch   Patient concerns; Frequent urination but does not feel he needs to see a doctor Dysuria random;   Nurse Concerns; Of interest, he was a chronic 60 pack year smoker and quit 2000  CT of the abd w contrast completed 04-26-2014 and 06-24-09 States his brother died of AAA and this was added to the history Not eligible for AAA screening under guidelines 65 to 24.  No aneurysm noted in CT report    Next PCP apt Just seen 06/29/2017     I have personally reviewed and noted the following in the patient's chart:   . Medical and social history . Use of alcohol, tobacco or illicit drugs  . Current medications and supplements . Functional ability and status . Nutritional status . Physical activity . Advanced directives . List of other physicians . Hospitalizations, surgeries, and ER visits in previous 12 months . Vitals . Screenings to include cognitive, depression, and falls . Referrals and appointments  In addition, I have reviewed and discussed with patient certain preventive protocols, quality metrics, and best practice recommendations. A written personalized care plan for preventive services as well as general preventive health recommendations were provided to patient.     Wynetta Fines, RN  07/13/2017

## 2017-07-17 ENCOUNTER — Ambulatory Visit (INDEPENDENT_AMBULATORY_CARE_PROVIDER_SITE_OTHER): Payer: Medicare HMO | Admitting: General Practice

## 2017-07-17 DIAGNOSIS — Z7901 Long term (current) use of anticoagulants: Secondary | ICD-10-CM

## 2017-07-17 LAB — POCT INR: INR: 1.9 — AB (ref 2.0–3.0)

## 2017-07-17 NOTE — Patient Instructions (Addendum)
Pre visit review using our clinic review tool, if applicable. No additional management support is needed unless otherwise documented below in the visit note.  Take 1 1/2 tablets today and then continue to take 1 tablet daily.  Re-check in 4 weeks. 

## 2017-07-20 ENCOUNTER — Other Ambulatory Visit: Payer: Self-pay | Admitting: Family Medicine

## 2017-07-20 ENCOUNTER — Other Ambulatory Visit: Payer: Self-pay | Admitting: Family

## 2017-08-14 ENCOUNTER — Ambulatory Visit (INDEPENDENT_AMBULATORY_CARE_PROVIDER_SITE_OTHER): Payer: Medicare HMO | Admitting: General Practice

## 2017-08-14 DIAGNOSIS — I82409 Acute embolism and thrombosis of unspecified deep veins of unspecified lower extremity: Secondary | ICD-10-CM

## 2017-08-14 DIAGNOSIS — Z7901 Long term (current) use of anticoagulants: Secondary | ICD-10-CM | POA: Diagnosis not present

## 2017-08-14 LAB — POCT INR: INR: 2.9 (ref 2.0–3.0)

## 2017-08-14 NOTE — Patient Instructions (Addendum)
Pre visit review using our clinic review tool, if applicable. No additional management support is needed unless otherwise documented below in the visit note.  Continue to take 1 tablet daily.  Re-check in 4 weeks.  

## 2017-08-16 ENCOUNTER — Other Ambulatory Visit: Payer: Self-pay | Admitting: Family Medicine

## 2017-08-17 ENCOUNTER — Other Ambulatory Visit: Payer: Self-pay | Admitting: Family Medicine

## 2017-09-04 DIAGNOSIS — Z961 Presence of intraocular lens: Secondary | ICD-10-CM | POA: Diagnosis not present

## 2017-09-11 ENCOUNTER — Ambulatory Visit: Payer: Medicare HMO

## 2017-10-12 ENCOUNTER — Other Ambulatory Visit: Payer: Self-pay | Admitting: Family Medicine

## 2017-10-23 ENCOUNTER — Other Ambulatory Visit: Payer: Self-pay | Admitting: Family Medicine

## 2017-10-23 DIAGNOSIS — I10 Essential (primary) hypertension: Secondary | ICD-10-CM

## 2017-11-23 DIAGNOSIS — R69 Illness, unspecified: Secondary | ICD-10-CM | POA: Diagnosis not present

## 2017-11-28 NOTE — Progress Notes (Signed)
William Norman  Date of visit:  11/11/2016 DOB:  09-Sep-1940    Age:  77 yrs. Medical record number:  [id number]     Account number:  [id number] Primary Care Provider: Mauricio Po ____________________________ CURRENT DIAGNOSES  1. CAD Native without angina  2. Hyperlipidemia  3. Essential (primary) hypertension  4. Obesity  5. Personal history of other venous thrombosis and embolism  6. Coronary angioplasty status  7. Venous insufficiency (chronic) (peripheral)  8. Long term (current) use of anticoagulants ____________________________ ALLERGIES  Simvastatin, Muscle aches ____________________________ MEDICATIONS  1. amlodipine 5 mg tablet, 1 p.o. daily  2. atenolol 100 mg Tablet, 1 p.o. daily  3. atorvastatin 40 mg tablet, 1/2 tab daily  4. Co Q-10 100 mg capsule, 300mg  p.o. daily  5. lisinopril 40 mg Tablet, 1 p.o. daily  6. Nexium 40 mg capsule,delayed release(DR/EC), PRN  7. niacin 500 mg tablet, 1 p.o. daily  8. nitroglycerin 0.4 mg Tablet, Sublingual, PRN  9. warfarin 5 mg Tablet, 5mg  qd ____________________________ HISTORY OF PRESENT ILLNESS Patient seen for cardiac followup. He has been doing well since he was previously here. He is exercising on a regular basis and his lipids were reviewed today and show good control. He denies angina and has no PND, orthopnea, syncope, palpitations, or claudication. He is getting ready to change primary care doctors again. He has had no bleeding complications from anticoagulation and no recurrence of DVT. ____________________________ PAST HISTORY  Past Medical Illnesses:  hypertension, hyperlipidemia, obesity, Deep vein thrombosis1988 with recurrence bilateal in 2002  on chronic coumadin, GERD, BPH, lumbar disc disease, carpal tunnel syndrome;  Cardiovascular Illnesses:  CAD;  Surgical Procedures:  inguinal herniorrhaphy-rt, deviated septum repair, emergency laparotomy with partial colon resection June 2010, closure of colostomy and repair  of hernia April 2011;  NYHA Classification:  I;  Canadian Angina Classification:  Class 0: Asymptomatic;  Cardiology Procedures-Invasive:  cardiac cath (left) July 1992, PTCA of the LAD July 1992, PTCA of the RCA February 1986;  Cardiology Procedures-Noninvasive:  treadmill cardiolite January, 2011;  Cardiac Cath Results:  normal Left main, 80% stenosis proximal LAD, no significant disease CFX, 40% stenosis proximal RCA, widely patent RCA at the previous angioplasty site, PTCA of LAD done after unsuccessful atherectomy;  LVEF of 66% documented via nuclear study on 02/19/2009,   ____________________________ CARDIO-PULMONARY TEST DATES EKG Date:  11/13/2015;   Cardiac Cath Date:  08/13/1990;  Stent Placement Date: 08/16/1990;  Nuclear Study Date:  02/19/2009;  Chest Xray Date: 05/07/1998;   ____________________________ FAMILY HISTORY Brother -- Brother dead, Myocardial infarction Father -- Father dead, Myocardial infarction Mother -- Mother dead, Heart disease ____________________________ SOCIAL HISTORY Alcohol Use:  drinks occasionally;  Smoking:  used to smoke but quit 1998;  Diet:  regular diet;  Lifestyle:  married;  Exercise:  some exercise;  Occupation:  retired AT;  Residence:  lives with wife;   ____________________________ REVIEW OF SYSTEMS General:  obesity Eyes: denies diplopia, history of glaucoma or visual problems. Ears, Nose, Throat, Mouth:  seasonal sinusitis Respiratory: denies dyspnea, cough, wheezing or hemoptysis. Cardiovascular:  please review HPI Abdominal: denies dyspepsia, GI bleeding, constipation, or diarrhea Genitourinary-Male: nocturia, denies erectile dysfunction, frequency  Musculoskeletal:  arthritis of the shoulder Neurological:  denies headaches, stroke, or TIA  ____________________________ PHYSICAL EXAMINATION VITAL SIGNS  Blood Pressure:  130/80 Standing, Left arm, regular cuff  , 120/70 Sitting, Left arm and regular cuff   Pulse:  64/min. Weight:  208.00 lbs.  Height:  71.00"BMI: 29  Constitutional:  pleasant white male in no acute distress, mildly obese Skin:  warm and dry to touch, no apparent skin lesions, or masses noted. Head:  normocephalic, normal hair pattern, no masses or tenderness ENT:  ears, nose and throat unremarkable, upper dentures present Neck:  supple, no masses, thyromegaly, JVD. Carotid pulses are full and equal bilaterally without bruits. Chest:  normal symmetry, clear to auscultation. Cardiac:  regular rhythm, normal S1 and S2, No S3 or S4, no murmurs, gallops or rubs detected. Peripheral Pulses:  the femoral,dorsalis pedis, and posterior tibial pulses are full and equal bilaterally with no bruits auscultated. Extremities & Back:  bilateral venous insufficiency changes present, trace edema Neurological:  no gross motor or sensory deficits noted, affect appropriate, oriented x3. ____________________________ MOST RECENT LIPID PANEL 10/14/14  CHOL TOTL 161 mg/dl, LDL 96 NM, HDL 45 mg/dl and TRIGLYCER 96 mg/dl ____________________________ IMPRESSIONS/PLAN  1. Coronary artery disease with remote PCI of the LAD and right coronary artery many years ago with no angina 2. Hyperlipidemia well controlled 3. Hypertension controlled 4. Long-term use of anticoagulation without complications  Recommendations:  Labs from primary physician were reviewed and updated. Continues on anticoagulation but no recurrence of DVT. Continue blood pressure treatment and lipid treatment. Followup in one year. Call if problems ____________________________ TODAYS ORDERS  1. Return Visit: 1 year ____________________________ Cardiology Physician:  Kerry Hough MD [facility]

## 2018-01-14 ENCOUNTER — Other Ambulatory Visit: Payer: Self-pay | Admitting: Family Medicine

## 2018-01-15 ENCOUNTER — Other Ambulatory Visit: Payer: Self-pay | Admitting: Family Medicine

## 2018-01-15 DIAGNOSIS — I10 Essential (primary) hypertension: Secondary | ICD-10-CM

## 2018-01-31 DIAGNOSIS — R69 Illness, unspecified: Secondary | ICD-10-CM | POA: Diagnosis not present

## 2018-02-05 ENCOUNTER — Other Ambulatory Visit: Payer: Self-pay | Admitting: Family Medicine

## 2018-02-05 NOTE — Telephone Encounter (Signed)
Rx request 

## 2018-03-07 ENCOUNTER — Ambulatory Visit (INDEPENDENT_AMBULATORY_CARE_PROVIDER_SITE_OTHER): Payer: Medicare HMO | Admitting: General Practice

## 2018-03-07 DIAGNOSIS — Z7901 Long term (current) use of anticoagulants: Secondary | ICD-10-CM | POA: Diagnosis not present

## 2018-03-07 LAB — POCT INR: INR: 3.1 — AB (ref 2.0–3.0)

## 2018-03-07 NOTE — Patient Instructions (Signed)
Pre visit review using our clinic review tool, if applicable. No additional management support is needed unless otherwise documented below in the visit note.  Take 1/2 tablet tomorrow and then continue to take 1 tablet daily. Re-check in 6 weeks.

## 2018-03-11 ENCOUNTER — Other Ambulatory Visit: Payer: Self-pay | Admitting: Family Medicine

## 2018-03-12 NOTE — Telephone Encounter (Signed)
Routing to cindy boyd,RN/coumadin clinic

## 2018-03-12 NOTE — Telephone Encounter (Signed)
Rx request 

## 2018-04-09 DIAGNOSIS — D1801 Hemangioma of skin and subcutaneous tissue: Secondary | ICD-10-CM | POA: Diagnosis not present

## 2018-04-09 DIAGNOSIS — L821 Other seborrheic keratosis: Secondary | ICD-10-CM | POA: Diagnosis not present

## 2018-04-09 DIAGNOSIS — L814 Other melanin hyperpigmentation: Secondary | ICD-10-CM | POA: Diagnosis not present

## 2018-04-09 DIAGNOSIS — D225 Melanocytic nevi of trunk: Secondary | ICD-10-CM | POA: Diagnosis not present

## 2018-04-09 DIAGNOSIS — L570 Actinic keratosis: Secondary | ICD-10-CM | POA: Diagnosis not present

## 2018-04-09 NOTE — Progress Notes (Signed)
Cardiology Office Note:    Date:  04/10/2018   ID:  William Norman, DOB 01-11-1941, MRN [id number]  PCP:  Vivi Barrack, MD  Cardiologist:  Shirlee More, MD    Referring MD: Vivi Barrack, MD    ASSESSMENT:    1. Coronary artery disease involving native coronary artery of native heart without angina pectoris   2. Essential hypertension   3. Dyslipidemia   4. Long term current use of anticoagulant therapy   5. Persistent atrial fibrillation    PLAN:    In order of problems listed above:  1. Stable CAD New York Heart Association class I continue current treatment this time I do not think he requires an ischemia evaluation he is having no angina on current treatment 2. Stable hypertension continue treatment including ACE beta-blocker calcium channel blocker he will have labs done as wellness with his primary care physician in the near future 3. Good result and continue atorvastatin lipids are at target 4. Continue warfarin goal INR 2.5 especially with atrial fibrillation 5. New compared to 2017 plan to 1 weeks ago reassess in the office at this time I be hesitant to consider cardioversion with age-indeterminate atrial fibrillation and echocardiogram   Next appointment: 6 weeks   Medication Adjustments/Labs and Tests Ordered: Current medicines are reviewed at length with the patient today.  Concerns regarding medicines are outlined above.  No orders of the defined types were placed in this encounter.  No orders of the defined types were placed in this encounter.   Chief Complaint  Patient presents with  . Follow-up  . Coronary Artery Disease    History of Present Illness:    William Norman is a 78 y.o. male with a hx of CAD with PCI of LAD in 1992 and RCA 1986 DVT on chronic coumadin hypertension and hyperlipidemia last seen 11/11/2016 Dr. Wynonia Lawman. Compliance with diet, lifestyle and medications: Yes  He has had a good year no change in exercise  tolerance no chest pain dyspnea edema palpitations syncope remains on warfarin today is in rate controlled atrial fibrillation normal axis his previous EKGs to see if this is new or old.  His last electrocardiogram in 2017 was sinus rhythm.  Since he is rate controlled and anticoagulated no change in his medications Past Medical History:  Diagnosis Date  . BPH (benign prostatic hypertrophy)   . BPH associated with nocturia 08/29/2013  . CAD (coronary artery disease)   . CAD (coronary artery disease), native coronary artery    PTCA of RCA 1986, PTCA of LAD 1992,  Negative treadmill Cardiolite 2011   . Chronic dermatitis of hands 10/12/2015  . Chronic venous insufficiency   . DVT (deep venous thrombosis) ([place])   . Dyslipidemia   . Eczema   . Erectile dysfunction 07/11/2012  . Essential hypertension   . GERD 08/04/2006      . GERD (gastroesophageal reflux disease)   . Hyperlipidemia   . Hypertension   . Long term current use of anticoagulant therapy   . Lumbar disc disease   . Overweight (BMI 25.0-29.9) 06/18/2015  . Personal history of DVT (deep vein thrombosis)    Initially in 1998 with recurrence in 2002 now on chronic warfarin   . Soft tissue lesion of foot 08/27/2015    Past Surgical History:  Procedure Laterality Date  . CARDIAC CATHETERIZATION    . CARPAL TUNNEL RELEASE  04/21/2011   Procedure: CARPAL TUNNEL RELEASE;  Surgeon: Tennis Must, MD;  Location: [facility];  Service: Orthopedics;  Laterality: Left;  . CARPAL TUNNEL RELEASE  05/23/2011   Procedure: CARPAL TUNNEL RELEASE;  Surgeon: Tennis Must, MD;  Location: [facility];  Service: Orthopedics;  Laterality: Right;  . COLON SURGERY  2010 and 2011 colostomy reversal  . CORONARY ANGIOPLASTY    . NASAL SEPTUM SURGERY    . precutaneous transluminal coronary angioplasty    . right hernia    . TONSILLECTOMY AND ADENOIDECTOMY      Current Medications: Current Meds  Medication Sig  . amLODipine  (NORVASC) 5 MG tablet TAKE 1 TABLET BY MOUTH EVERY DAY  . atenolol (TENORMIN) 100 MG tablet TAKE 1 TABLET BY MOUTH EVERY DAY  . atorvastatin (LIPITOR) 40 MG tablet Take 20 mg by mouth daily.  . Coenzyme Q10 (COQ10) 200 MG CAPS Take 1 capsule by mouth daily.   . hydroxypropyl methylcellulose / hypromellose (ISOPTO TEARS / GONIOVISC) 2.5 % ophthalmic solution Place 1 drop into both eyes 4 (four) times daily as needed for dry eyes.  Marland Kitchen lisinopril (PRINIVIL,ZESTRIL) 40 MG tablet TAKE 1 TABLET(40 MG) BY MOUTH DAILY *KEEP APPT FOR MORE REFILLS**  . nitroGLYCERIN (NITROSTAT) 0.4 MG SL tablet Dissolve 1 tablet under the tongue every 5 minutes as needed  . warfarin (COUMADIN) 5 MG tablet TAKE 1 TABLET BY MOUTH DAILY OR AS DIRECTED BY ANTICOAGULATION CLINIC.     Allergies:   Other and Simvastatin   Social History   Socioeconomic History  . Marital status: Married    Spouse name: Not on file  . Number of children: 3  . Years of education: 36  . Highest education level: Not on file  Occupational History  . Occupation: Retired  Scientific laboratory technician  . Financial resource strain: Not on file  . Food insecurity:    Worry: Not on file    Inability: Not on file  . Transportation needs:    Medical: Not on file    Non-medical: Not on file  Tobacco Use  . Smoking status: Former Smoker    Packs/day: 1.00    Years: 60.00    Pack years: 60.00    Types: Cigarettes    Last attempt to quit: 03/25/1998    Years since quitting: 20.0  . Smokeless tobacco: Never Used  Substance and Sexual Activity  . Alcohol use: Yes    Alcohol/week: 7.0 standard drinks    Types: 7 Glasses of wine per week    Comment: 7 glasses of red wine per week   . Drug use: No  . Sexual activity: Not on file  Lifestyle  . Physical activity:    Days per week: Not on file    Minutes per session: Not on file  . Stress: Not on file  Relationships  . Social connections:    Talks on phone: Not on file    Gets together: Not on file     Attends religious service: Not on file    Active member of club or organization: Not on file    Attends meetings of clubs or organizations: Not on file    Relationship status: Not on file  Other Topics Concern  . Not on file  Social History Narrative   Fun: Working outside in yard, cutting wood    Denies abuse and feels safe at home.      Family History: The patient's family history includes AAA (abdominal aortic aneurysm) in his brother; Heart attack in his father; Heart disease in  his father and mother. ROS:   Please see the history of present illness.    All other systems reviewed and are negative.  EKGs/Labs/Other Studies Reviewed:    The following studies were reviewed today:  EKG:  EKG ordered today.  The ekg ordered today demonstrates AF controlled rate   Recent Labs: 06/29/2017: ALT 19; BUN 17; Creatinine, Ser 0.82; Hemoglobin 14.6; Platelets 219.0; Potassium 5.0; Sodium 138; TSH 2.50  Recent Lipid Panel    Component Value Date/Time   CHOL 146 06/29/2017 1118   TRIG 93.0 06/29/2017 1118   TRIG 68 01/10/2006 1108   HDL 47.40 06/29/2017 1118   CHOLHDL 3 06/29/2017 1118   VLDL 18.6 06/29/2017 1118   LDLCALC 80 06/29/2017 1118   LDLDIRECT 131.5 04/22/2010 1018   06/29/17    Chol 146 HDL 47 LDL 80 Physical Exam:    VS:  BP 138/84 (BP Location: Right Arm, Patient Position: Sitting, Cuff Size: Normal)   Pulse 67   Ht 6' (1.829 m)   Wt 217 lb 12.8 oz (98.8 kg)   SpO2 93%   BMI 29.54 kg/m     Wt Readings from Last 3 Encounters:  04/10/18 217 lb 12.8 oz (98.8 kg)  07/13/17 211 lb (95.7 kg)  06/29/17 208 lb (94.3 kg)     GEN:  Well nourished, well developed in no acute distress HEENT: Normal NECK: No JVD; No carotid bruits LYMPHATICS: No lymphadenopathy CARDIAC: Irr Irr variable s1  no murmurs, rubs, gallops RESPIRATORY:  Clear to auscultation without rales, wheezing or rhonchi  ABDOMEN: Soft, non-tender, non-distended MUSCULOSKELETAL:  No edema; No deformity    SKIN: Warm and dry NEUROLOGIC:  Alert and oriented x 3 PSYCHIATRIC:  Normal affect    Signed, Shirlee More, MD  04/10/2018 4:37 PM     [facility]

## 2018-04-10 ENCOUNTER — Encounter: Payer: Self-pay | Admitting: Cardiology

## 2018-04-10 ENCOUNTER — Ambulatory Visit: Payer: Medicare HMO | Admitting: Cardiology

## 2018-04-10 VITALS — BP 138/84 | HR 67 | Ht 72.0 in | Wt 217.8 lb

## 2018-04-10 DIAGNOSIS — I10 Essential (primary) hypertension: Secondary | ICD-10-CM | POA: Diagnosis not present

## 2018-04-10 DIAGNOSIS — I4819 Other persistent atrial fibrillation: Secondary | ICD-10-CM

## 2018-04-10 DIAGNOSIS — E785 Hyperlipidemia, unspecified: Secondary | ICD-10-CM

## 2018-04-10 DIAGNOSIS — I2510 Atherosclerotic heart disease of native coronary artery without angina pectoris: Secondary | ICD-10-CM | POA: Diagnosis not present

## 2018-04-10 DIAGNOSIS — Z7901 Long term (current) use of anticoagulants: Secondary | ICD-10-CM

## 2018-04-10 HISTORY — DX: Other persistent atrial fibrillation: I48.19

## 2018-04-10 NOTE — Patient Instructions (Addendum)
Medication Instructions:  Your physician recommends that you continue on your current medications as directed. Please refer to the Current Medication list given to you today.  If you need a refill on your cardiac medications before your next appointment, please call your pharmacy.   Lab work: NONE   Testing/Procedures: An EKG was performed today  Your physician has recommended that you wear a ZIO monitor. ZIO monitors are medical devices that record the heart's electrical activity. Doctors most often use these monitors to diagnose arrhythmias. Arrhythmias are problems with the speed or rhythm of the heartbeat. The monitor is a small, portable device. You can wear one while you do your normal daily activities. This is usually used to diagnose what is causing palpitations/syncope (passing out).You will wear this device for 1 week.  Your physician has requested that you have an echocardiogram. Echocardiography is a painless test that uses sound waves to create images of your heart. It provides your doctor with information about the size and shape of your heart and how well your heart's chambers and valves are working. This procedure takes approximately one hour. There are no restrictions for this procedure.     Follow-Up: At [facility], you and your health needs are our priority.  As part of our continuing mission to provide you with exceptional heart care, we have created designated Provider Care Teams.  These Care Teams include your primary Cardiologist (physician) and Advanced Practice Providers (APPs -  Physician Assistants and Nurse Practitioners) who all work together to provide you with the care you need, when you need it. You will need a follow up appointment in 6 weeks.  Please call our office 2 months in advance to schedule this appointment.     Echocardiogram An echocardiogram is a procedure that uses painless sound waves (ultrasound) to produce an image of the heart. Images  from an echocardiogram can provide important information about:  Signs of coronary artery disease (CAD).  Aneurysm detection. An aneurysm is a weak or damaged part of an artery wall that bulges out from the normal force of blood pumping through the body.  Heart size and shape. Changes in the size or shape of the heart can be associated with certain conditions, including heart failure, aneurysm, and CAD.  Heart muscle function.  Heart valve function.  Signs of a past heart attack.  Fluid buildup around the heart.  Thickening of the heart muscle.  A tumor or infectious growth around the heart valves. Tell a health care provider about:  Any allergies you have.  All medicines you are taking, including vitamins, herbs, eye drops, creams, and over-the-counter medicines.  Any blood disorders you have.  Any surgeries you have had.  Any medical conditions you have.  Whether you are pregnant or may be pregnant. What are the risks? Generally, this is a safe procedure. However, problems may occur, including:  Allergic reaction to dye (contrast) that may be used during the procedure. What happens before the procedure? No specific preparation is needed. You may eat and drink normally. What happens during the procedure?   An IV tube may be inserted into one of your veins.  You may receive contrast through this tube. A contrast is an injection that improves the quality of the pictures from your heart.  A gel will be applied to your chest.  A wand-like tool (transducer) will be moved over your chest. The gel will help to transmit the sound waves from the transducer.  The sound waves  will harmlessly bounce off of your heart to allow the heart images to be captured in real-time motion. The images will be recorded on a computer. The procedure may vary among health care providers and hospitals. What happens after the procedure?  You may return to your normal, everyday life, including  diet, activities, and medicines, unless your health care provider tells you not to do that. Summary  An echocardiogram is a procedure that uses painless sound waves (ultrasound) to produce an image of the heart.  Images from an echocardiogram can provide important information about the size and shape of your heart, heart muscle function, heart valve function, and fluid buildup around your heart.  You do not need to do anything to prepare before this procedure. You may eat and drink normally.  After the echocardiogram is completed, you may return to your normal, everyday life, unless your health care provider tells you not to do that. This information is not intended to replace advice given to you by your health care provider. Make sure you discuss any questions you have with your health care provider. Document Released: 01/29/2000 Document Revised: 03/05/2016 Document Reviewed: 03/05/2016 Elsevier Interactive Patient Education  2019 [employer].

## 2018-04-11 ENCOUNTER — Other Ambulatory Visit: Payer: Self-pay | Admitting: Family Medicine

## 2018-04-12 ENCOUNTER — Ambulatory Visit (INDEPENDENT_AMBULATORY_CARE_PROVIDER_SITE_OTHER): Payer: Medicare HMO

## 2018-04-12 DIAGNOSIS — I4819 Other persistent atrial fibrillation: Secondary | ICD-10-CM

## 2018-04-18 ENCOUNTER — Ambulatory Visit (HOSPITAL_BASED_OUTPATIENT_CLINIC_OR_DEPARTMENT_OTHER)
Admission: RE | Admit: 2018-04-18 | Discharge: 2018-04-18 | Disposition: A | Discharge status: Home / Self Care | Payer: Medicare HMO | Source: Ambulatory Visit | Attending: Cardiology | Admitting: Cardiology

## 2018-04-18 ENCOUNTER — Ambulatory Visit (INDEPENDENT_AMBULATORY_CARE_PROVIDER_SITE_OTHER): Payer: Medicare HMO | Admitting: General Practice

## 2018-04-18 DIAGNOSIS — I10 Essential (primary) hypertension: Secondary | ICD-10-CM | POA: Diagnosis not present

## 2018-04-18 DIAGNOSIS — I4819 Other persistent atrial fibrillation: Secondary | ICD-10-CM | POA: Diagnosis not present

## 2018-04-18 DIAGNOSIS — Z7901 Long term (current) use of anticoagulants: Secondary | ICD-10-CM | POA: Insufficient documentation

## 2018-04-18 DIAGNOSIS — I2510 Atherosclerotic heart disease of native coronary artery without angina pectoris: Secondary | ICD-10-CM | POA: Diagnosis not present

## 2018-04-18 LAB — POCT INR: INR: 2.2 (ref 2.0–3.0)

## 2018-04-18 NOTE — Progress Notes (Signed)
  Echocardiogram 2D Echocardiogram has been performed.  Taber Sweetser T Paiton Fosco 04/18/2018, 11:09 AM

## 2018-04-18 NOTE — Patient Instructions (Addendum)
Pre visit review using our clinic review tool, if applicable. No additional management support is needed unless otherwise documented below in the visit note.  Continue to take 1 tablet daily.  Re-check in 6 weeks.  

## 2018-04-18 NOTE — Progress Notes (Signed)
I have reviewed and agree with this plan  

## 2018-04-23 ENCOUNTER — Encounter: Payer: Self-pay | Admitting: Podiatry

## 2018-04-23 ENCOUNTER — Ambulatory Visit: Payer: Medicare HMO | Admitting: Podiatry

## 2018-04-23 DIAGNOSIS — B351 Tinea unguium: Secondary | ICD-10-CM | POA: Diagnosis not present

## 2018-04-23 DIAGNOSIS — I4891 Unspecified atrial fibrillation: Secondary | ICD-10-CM | POA: Diagnosis not present

## 2018-04-23 DIAGNOSIS — M79676 Pain in unspecified toe(s): Secondary | ICD-10-CM

## 2018-04-23 DIAGNOSIS — I4819 Other persistent atrial fibrillation: Secondary | ICD-10-CM | POA: Diagnosis not present

## 2018-04-23 DIAGNOSIS — I472 Ventricular tachycardia: Secondary | ICD-10-CM | POA: Diagnosis not present

## 2018-04-26 DIAGNOSIS — R69 Illness, unspecified: Secondary | ICD-10-CM | POA: Diagnosis not present

## 2018-04-26 NOTE — Progress Notes (Signed)
   SUBJECTIVE Patient presents to office today complaining of elongated, thickened nails that cause pain while ambulating in shoes. He is unable to trim his own nails. Patient is here for further evaluation and treatment.  Past Medical History:  Diagnosis Date  . BPH (benign prostatic hypertrophy)   . BPH associated with nocturia 08/29/2013  . CAD (coronary artery disease)   . CAD (coronary artery disease), native coronary artery    PTCA of RCA 1986, PTCA of LAD 1992,  Negative treadmill Cardiolite 2011   . Chronic dermatitis of hands 10/12/2015  . Chronic venous insufficiency   . DVT (deep venous thrombosis) ([facility])   . Dyslipidemia   . Eczema   . Erectile dysfunction 07/11/2012  . Essential hypertension   . GERD 08/04/2006      . GERD (gastroesophageal reflux disease)   . Hyperlipidemia   . Hypertension   . Long term current use of anticoagulant therapy   . Lumbar disc disease   . Overweight (BMI 25.0-29.9) 06/18/2015  . Personal history of DVT (deep vein thrombosis)    Initially in 1998 with recurrence in 2002 now on chronic warfarin   . Soft tissue lesion of foot 08/27/2015    OBJECTIVE General Patient is awake, alert, and oriented x 3 and in no acute distress. Derm Skin is dry and supple bilateral. Negative open lesions or macerations. Remaining integument unremarkable. Nails are tender, long, thickened and dystrophic with subungual debris, consistent with onychomycosis, 1-5 bilateral. No signs of infection noted. Vasc  DP and PT pedal pulses palpable bilaterally. Temperature gradient within normal limits.  Neuro Epicritic and protective threshold sensation grossly intact bilaterally.  Musculoskeletal Exam No symptomatic pedal deformities noted bilateral. Muscular strength within normal limits.  ASSESSMENT 1. Onychodystrophic nails 1-5 bilateral with hyperkeratosis of nails.  2. Onychomycosis of nail due to dermatophyte bilateral 3. Pain in foot bilateral  PLAN OF CARE 1.  Patient evaluated today.  2. Instructed to maintain good pedal hygiene and foot care.  3. Mechanical debridement of nails 1-5 bilaterally performed using a nail nipper. Filed with dremel without incident.  4. Return to clinic in 3 mos.    Emonte Dieujuste M. Owin Vignola, DPM [facility]  Dr. Dailynn Nancarrow M. Jayani Rozman, DPM    [address]                                        [place], [place] [address]                Office [phone]  Fax [phone]     

## 2018-05-10 ENCOUNTER — Ambulatory Visit (INDEPENDENT_AMBULATORY_CARE_PROVIDER_SITE_OTHER): Payer: Medicare HMO

## 2018-05-10 ENCOUNTER — Ambulatory Visit (INDEPENDENT_AMBULATORY_CARE_PROVIDER_SITE_OTHER): Payer: Medicare HMO | Admitting: Family Medicine

## 2018-05-10 ENCOUNTER — Encounter: Payer: Self-pay | Admitting: Family Medicine

## 2018-05-10 ENCOUNTER — Other Ambulatory Visit: Payer: Self-pay

## 2018-05-10 VITALS — BP 132/80 | HR 77 | Temp 97.8°F | Ht 72.0 in | Wt 216.4 lb

## 2018-05-10 DIAGNOSIS — R0781 Pleurodynia: Secondary | ICD-10-CM | POA: Diagnosis not present

## 2018-05-10 DIAGNOSIS — R0782 Intercostal pain: Secondary | ICD-10-CM | POA: Diagnosis not present

## 2018-05-10 IMAGING — DX LEFT RIBS AND CHEST - 3+ VIEW
5 series · 5 of 5 positions shown · non-contrast
Comparison: Chest radiograph [DATE]

CLINICAL DATA: LEFT rib pain, injury yesterday

EXAM:
LEFT RIBS AND CHEST - 3+ VIEW

[chest pa]
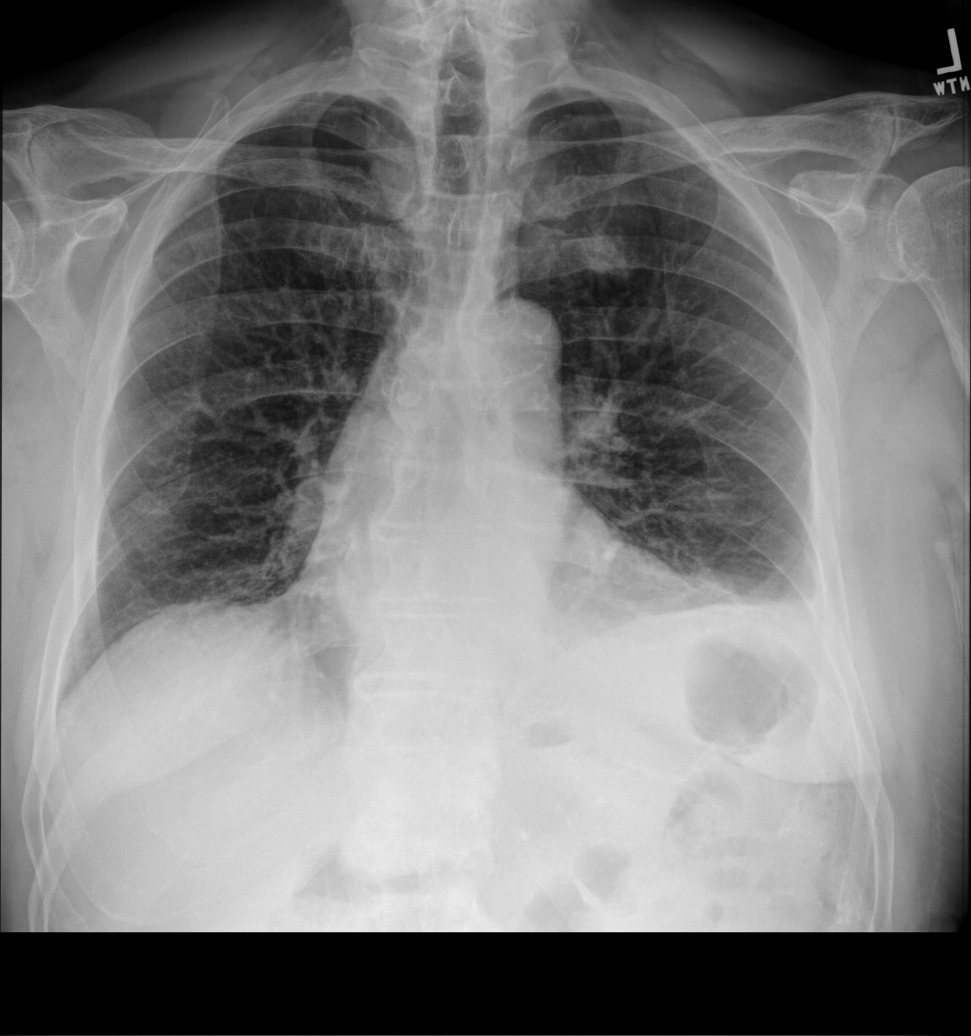

[hemithorax (ribs) ap (1 of 2)]
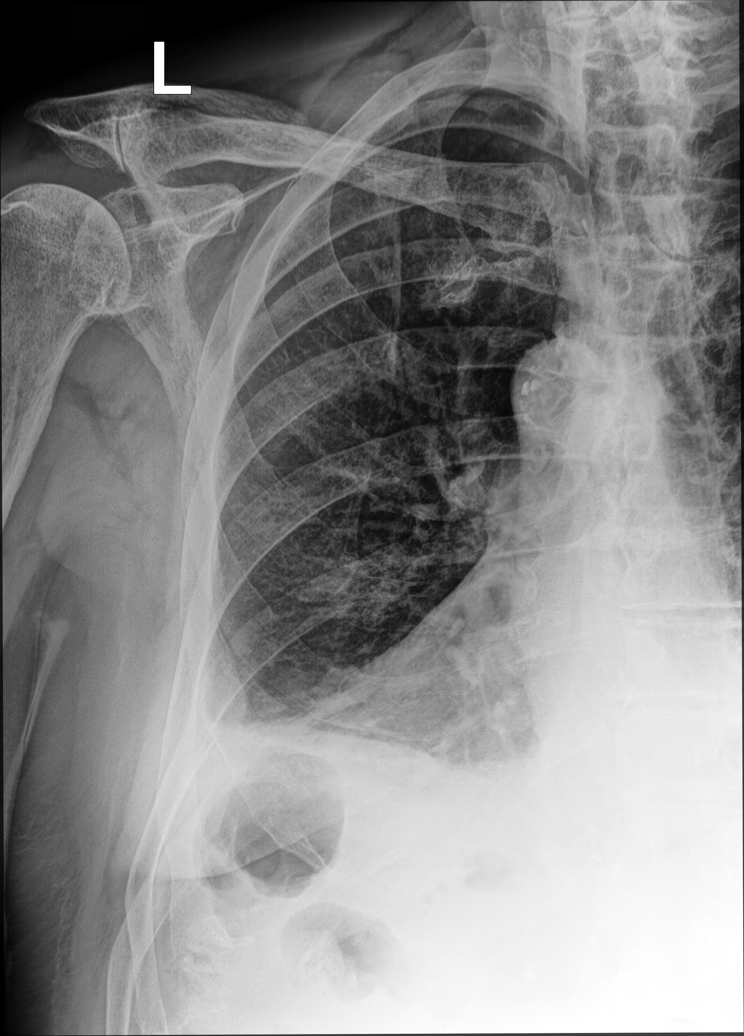

[hemithorax (ribs) ap (2 of 2)]
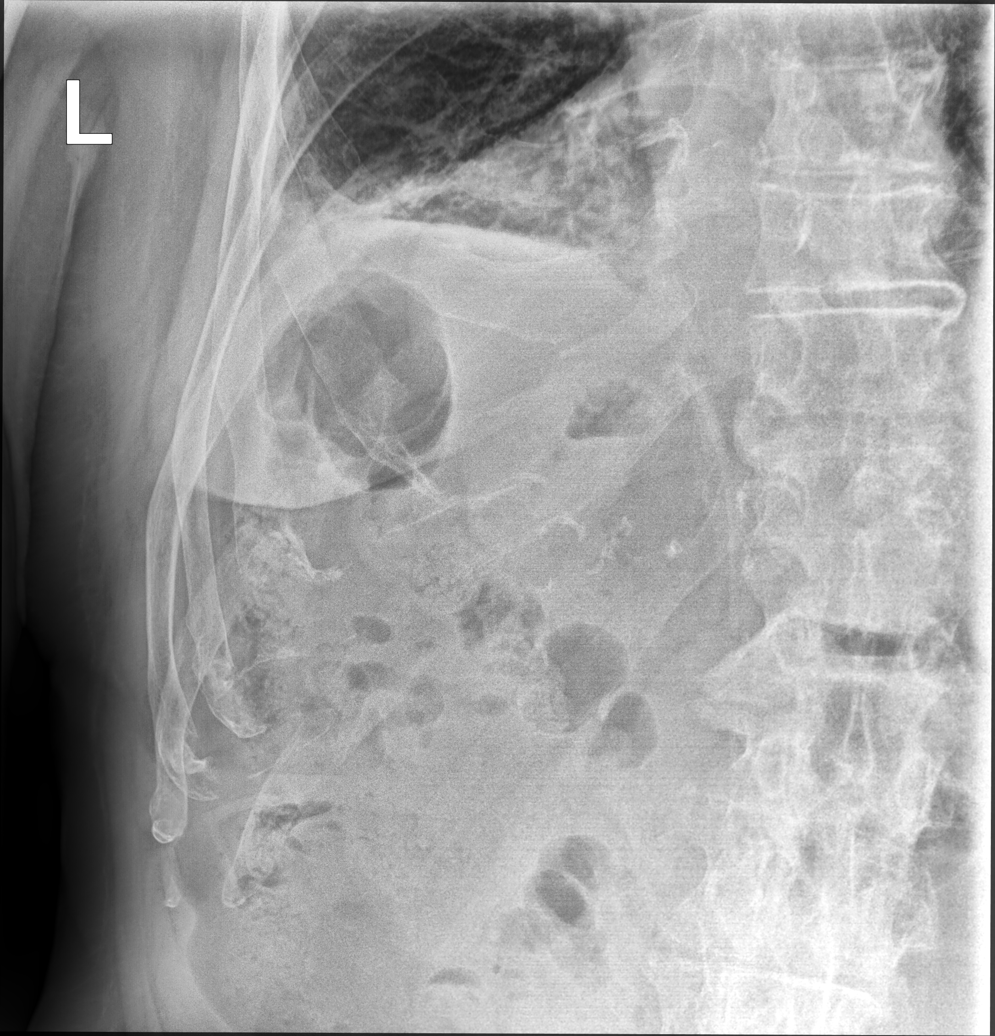

[oblique ribs oblique (1 of 2)]
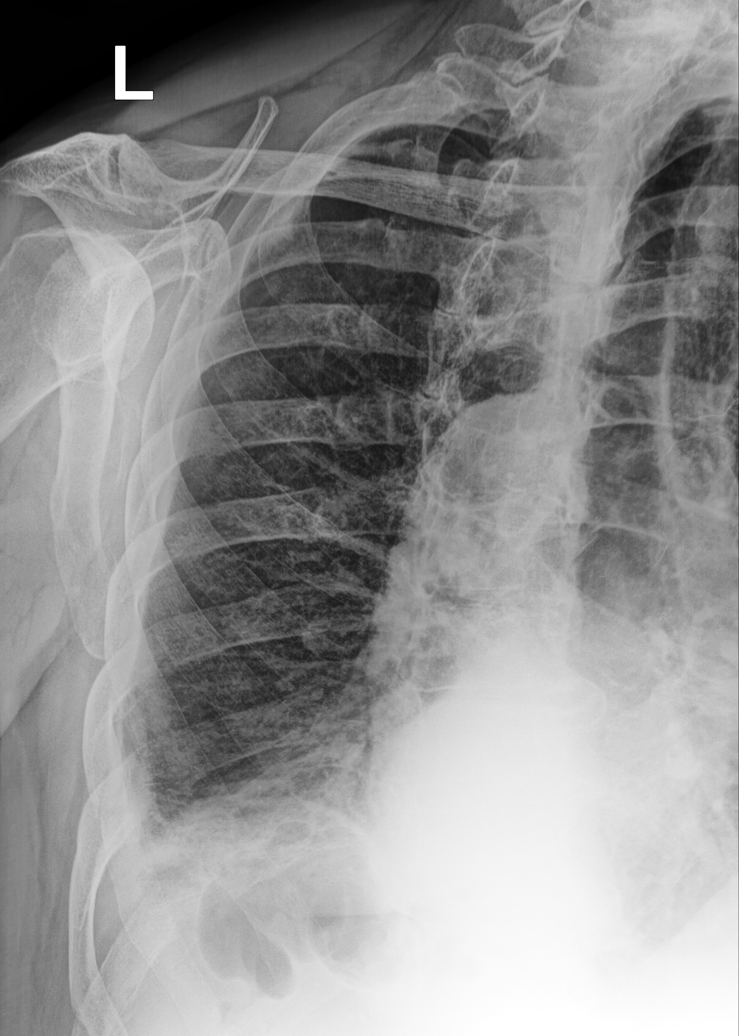

[oblique ribs oblique (2 of 2)]
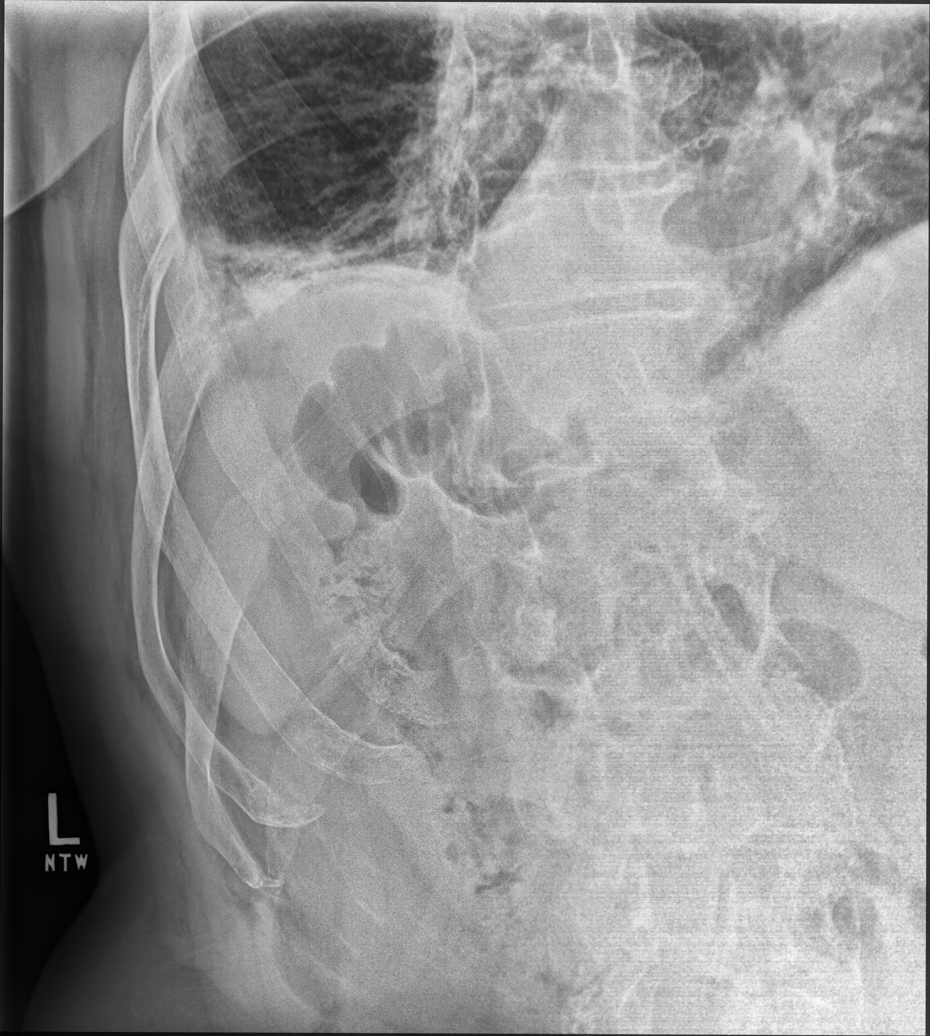

[5 of 5 positions shown; findings below may reference images not displayed]

FINDINGS: Upper normal heart size.

Atherosclerotic calcification aorta.

Mediastinal contours and pulmonary vascularity normal.

LEFT basilar atelectasis.

Remaining lungs clear.

No pneumothorax.

Bones demineralized.

Minimally displaced acute fracture of lateral LEFT eighth rib.

No additional focal osseous abnormalities.
IMPRESSION: Fracture of the lateral LEFT eighth rib with associated LEFT basilar
atelectasis.

## 2018-05-10 MED ORDER — ACETAMINOPHEN-CODEINE #3 300-30 MG PO TABS: 1.0000 | ORAL_TABLET | Freq: Three times a day (TID) | ORAL | 0 refills | Status: AC | PRN

## 2018-05-10 NOTE — Progress Notes (Signed)
   Chief Complaint:  William Norman is a 78 y.o. male who presents for same day appointment with a chief complaint of back pain.   Assessment/Plan:  Left Sided Rib Pain Plain film without obvious rib fracture based on my read-we will await radiology read.  Possibly has a very small left pleural effusion-again will await radiology read.  Will treat symptomatically with short supply of Tylenol 3.  Encouraged deep inspirations periodically to prevent atelectasis.  Discussed reasons to return to care and seek emergent care.  Follow-up as needed.    Subjective:  HPI:  Back Pain, acute problem Symptoms started yesterday. Patient was trimming tree limbs with a polesaw when one fell onto his back and left side of his ribs. He had the breath knocked out of him and fell to the ground.  He was able to get up and resume his normal activities. Since then, has had worsening left rib and back pain.  Pain is worse with lying on his left side.  Also worse with deep inspiration.  No difficulty breathing.  No other obvious alleviating or aggravating factors.    ROS: Per HPI  PMH: He reports that he quit smoking about 20 years ago. His smoking use included cigarettes. He has a 60.00 pack-year smoking history. He has never used smokeless tobacco. He reports current alcohol use of about 7.0 standard drinks of alcohol per week. He reports that he does not use drugs.      Objective:  Physical Exam: BP 132/80 (BP Location: Left Arm, Patient Position: Sitting, Cuff Size: Normal)   Pulse 77   Temp 97.8 F (36.6 C) (Oral)   Ht 6' (1.829 m)   Wt 216 lb 6.4 oz (98.2 kg)   SpO2 94%   BMI 29.35 kg/m   Gen: NAD, resting comfortably CV: Regular rate and rhythm with no murmurs appreciated Pulm: Normal work of breathing, clear to auscultation bilaterally with no crackles, wheezes, or rhonchi MSK: No apparent deformities.  Tender to palpation along posterior aspect of lower rib wall.     [name]. [name], MD  05/10/2018 3:53 PM

## 2018-05-11 NOTE — Progress Notes (Signed)
Please inform patient of the following:  Radiology reviewed his xray and found a small fracture in his 8th rib that I did not see. This does not change management, but he should expect to have some pain there for the next 4-6 weeks. Would like for him to continue working on taking deep breaths and use the pain medication if needed. Would like for him to let us know if his symptoms do not improve gradually over the next few weeks.   [name]. [name], MD 05/11/2018 9:45 AM

## 2018-05-18 ENCOUNTER — Telehealth: Payer: Self-pay | Admitting: Cardiology

## 2018-05-18 NOTE — Telephone Encounter (Signed)
Cardiac Questionnaire:    Since your last visit or hospitalization:    1. Have you been having new or worsening chest pain? no   2. Have you been having new or worsening shortness of breath?no 3. Have you been having new or worsening leg swelling, wt gain, or increase in abdominal girth (pants fitting more tightly)? No    4. Have you had any passing out spells? no    *A YES to any of these questions would result in the appointment being kept. *If all the answers to these questions are NO, we should indicate that given the current situation regarding the worldwide coronarvirus pandemic, at the recommendation of the CDC, we are looking to limit gatherings in our waiting area, and thus will reschedule their appointment beyond four weeks from today.   _____________   [id number]-[id number] Pre-Screening Questions:   Do you currently have a fever? no  Have you recently travelled on a cruise, internationally, or to [place], [place], [place], [place], or [place], [place]) ? no  Have you been in contact with someone that is currently pending confirmation of Covid19 testing or has been confirmed to have the [place] virus?  no  Are you currently experiencing fatigue or cough? No  YOUR CARDIOLOGY TEAM HAS ARRANGED FOR AN E-VISIT FOR YOUR APPOINTMENT - PLEASE REVIEW IMPORTANT INFORMATION BELOW SEVERAL DAYS PRIOR TO YOUR APPOINTMENT  Due to the recent COVID-19 pandemic, we are transitioning in-person office visits to tele-medicine visits in an effort to decrease unnecessary exposure to our patients and staff. Medicare and most insurances are covering these visits without a copay needed. We also encourage you to sign up for MyChart if you have not already done so. You will need a smartphone if possible. For patients that do not have this, we can still complete the visit using a regular telephone but do prefer a smartphone to enable video when possible. You may have a close family member that lives with you that can  help. If possible, we also ask that you have a blood pressure cuff and scale at home to measure your blood pressure, heart rate and weight prior to your scheduled appointment. Patients with clinical needs that need an in-person evaluation and testing will still be able to come to the office if absolutely necessary. If you have any questions, feel free to call our office.    CONSENT FOR TELE-HEALTH VISIT - PLEASE REVIEW  I hereby voluntarily request, consent and authorize [facility] and its employed or contracted physicians, physician assistants, nurse practitioners or other licensed health care professionals (the Practitioner), to provide me with telemedicine health care services (the Services") as deemed necessary by the treating Practitioner. I acknowledge and consent to receive the Services by the Practitioner via telemedicine. I understand that the telemedicine visit will involve communicating with the Practitioner through live audiovisual communication technology and the disclosure of certain medical information by electronic transmission. I acknowledge that I have been given the opportunity to request an in-person assessment or other available alternative prior to the telemedicine visit and am voluntarily participating in the telemedicine visit.  I understand that I have the right to withhold or withdraw my consent to the use of telemedicine in the course of my care at any time, without affecting my right to future care or treatment, and that the Practitioner or I may terminate the telemedicine visit at any time. I understand that I have the right to inspect all information obtained and/or recorded in the  course of the telemedicine visit and may receive copies of available information for a reasonable fee.  I understand that some of the potential risks of receiving the Services via telemedicine include:   Delay or interruption in medical evaluation due to technological equipment failure or  disruption;  Information transmitted may not be sufficient (e.g. poor resolution of images) to allow for appropriate medical decision making by the Practitioner; and/or   In rare instances, security protocols could fail, causing a breach of personal health information.  Furthermore, I acknowledge that it is my responsibility to provide information about my medical history, conditions and care that is complete and accurate to the best of my ability. I acknowledge that Practitioner's advice, recommendations, and/or decision may be based on factors not within their control, such as incomplete or inaccurate data provided by me or distortions of diagnostic images or specimens that may result from electronic transmissions. I understand that the practice of medicine is not an exact science and that Practitioner makes no warranties or guarantees regarding treatment outcomes. I acknowledge that I will receive a copy of this consent concurrently upon execution via email to the email address I last provided but may also request a printed copy by calling the office of [facility].    I understand that my insurance will be billed for this visit.   I have read or had this consent read to me.  I understand the contents of this consent, which adequately explains the benefits and risks of the Services being provided via telemedicine.   I have been provided ample opportunity to ask questions regarding this consent and the Services and have had my questions answered to my satisfaction.  I give my informed consent for the services to be provided through the use of telemedicine in my medical care  By participating in this telemedicine visit I agree to the above.  Patient gives verbal consent for televisit 05/18/2018 pp

## 2018-05-22 ENCOUNTER — Ambulatory Visit: Payer: Medicare HMO | Admitting: Cardiology

## 2018-05-23 ENCOUNTER — Encounter: Payer: Self-pay | Admitting: Cardiology

## 2018-05-23 ENCOUNTER — Telehealth (INDEPENDENT_AMBULATORY_CARE_PROVIDER_SITE_OTHER): Payer: Medicare HMO | Admitting: Cardiology

## 2018-05-23 VITALS — BP 131/71 | HR 76 | Ht 72.0 in | Wt 217.0 lb

## 2018-05-23 DIAGNOSIS — I119 Hypertensive heart disease without heart failure: Secondary | ICD-10-CM | POA: Diagnosis not present

## 2018-05-23 DIAGNOSIS — Z7901 Long term (current) use of anticoagulants: Secondary | ICD-10-CM | POA: Diagnosis not present

## 2018-05-23 DIAGNOSIS — Z79899 Other long term (current) drug therapy: Secondary | ICD-10-CM

## 2018-05-23 DIAGNOSIS — E785 Hyperlipidemia, unspecified: Secondary | ICD-10-CM

## 2018-05-23 DIAGNOSIS — Z87891 Personal history of nicotine dependence: Secondary | ICD-10-CM

## 2018-05-23 DIAGNOSIS — I2510 Atherosclerotic heart disease of native coronary artery without angina pectoris: Secondary | ICD-10-CM | POA: Diagnosis not present

## 2018-05-23 DIAGNOSIS — I4819 Other persistent atrial fibrillation: Secondary | ICD-10-CM | POA: Diagnosis not present

## 2018-05-23 NOTE — Progress Notes (Signed)
Virtual Visit via Telephone Note   This visit type was conducted due to national recommendations for restrictions regarding the COVID-19 Pandemic (e.g. social distancing) in an effort to limit this patient's exposure and mitigate transmission in our community.  Due to his co-morbid illnesses, this patient is at least at moderate risk for complications without adequate follow up.  This format is felt to be most appropriate for this patient at this time.  The patient did not have access to video technology/had technical difficulties with video requiring transitioning to audio format only (telephone).  All issues noted in this document were discussed and addressed.  No physical exam could be performed with this format.  Please refer to the patient's chart for his  consent to telehealth for [facility].   Evaluation Performed:  Follow-up visit  Date:  05/23/2018   ID:  William Norman, William Norman 07-23-1940, MRN [id number]  Patient Location: Home  Provider Location: Home  PCP:  Vivi Barrack, MD  Cardiologist:  No primary care provider on file. Dr Bettina Gavia Electrophysiologist:  None   Chief Complaint:  FU after tests for atrial fibrillation  History of Present Illness:    William Norman is a 78 y.o. male who presents via audio/video conferencing for a telehealth visit today.    William Norman is a 78 y.o. male with a hx of CAD with PCI of LAD in 1992 and RCA 1986 DVT on chronic coumadin hypertension and hyperlipidemia last seen 04/11/18.  He was asymptomatic but found to be in persistent rate controlled atrial fibrillation. Echocardiogram 04/18/2018 showed normal left ventricular size and function EF 60 to 65% the right ventricle is normal there was moderate to severe left atrial and right atrial enlargement.  Mild aortic and moderate mitral regurgitation was noted.  Quantitative evaluation of mitral regurgitation was not performed and there is no message of reflux into the  pulmonary vein.  A 7-day ZIO monitor showed atrial fibrillation with a controlled rate.  The patient does not have symptoms concerning for COVID-19 infection (fever, chills, cough, or new shortness of breath).   Unfortunately cannot master video link on his iPhone.  He was struck in the back by a tree branch x-ray 05/10/2018 showed a left eighth rib fracture and atelectasis.  He is slowly improving but still has back and rib pain.  No shortness of breath palpitation orthopnea chest pain syncope or TIA he is on warfarin his last INR was therapeutic 2.2 and has had no bleeding complication.  I reviewed his 7-day extended heart rhythm monitor with well rate controlled atrial fibrillation and echocardiogram showing moderate to severe biatrial enlargement and recommended that we not attempt to resume sinus rhythm I think he be a failure of cardioversion antiarrhythmic drugs.  Clinically his mitral regurgitation is not severe. Past Medical History:  Diagnosis Date  . BPH (benign prostatic hypertrophy)   . BPH associated with nocturia 08/29/2013  . CAD (coronary artery disease)   . CAD (coronary artery disease), native coronary artery    PTCA of RCA 1986, PTCA of LAD 1992,  Negative treadmill Cardiolite 2011   . Chronic dermatitis of hands 10/12/2015  . Chronic venous insufficiency   . DVT (deep venous thrombosis) ([place])   . Dyslipidemia   . Eczema   . Erectile dysfunction 07/11/2012  . Essential hypertension   . GERD 08/04/2006      . GERD (gastroesophageal reflux disease)   . Hyperlipidemia   . Hypertension   . Long  term current use of anticoagulant therapy   . Lumbar disc disease   . Overweight (BMI 25.0-29.9) 06/18/2015  . Personal history of DVT (deep vein thrombosis)    Initially in 1998 with recurrence in 2002 now on chronic warfarin   . Soft tissue lesion of foot 08/27/2015   Past Surgical History:  Procedure Laterality Date  . CARDIAC CATHETERIZATION    . CARPAL TUNNEL RELEASE  04/21/2011    Procedure: CARPAL TUNNEL RELEASE;  Surgeon: Tennis Must, MD;  Location: [facility];  Service: Orthopedics;  Laterality: Left;  . CARPAL TUNNEL RELEASE  05/23/2011   Procedure: CARPAL TUNNEL RELEASE;  Surgeon: Tennis Must, MD;  Location: [facility];  Service: Orthopedics;  Laterality: Right;  . COLON SURGERY  2010 and 2011 colostomy reversal  . CORONARY ANGIOPLASTY    . NASAL SEPTUM SURGERY    . precutaneous transluminal coronary angioplasty    . right hernia    . TONSILLECTOMY AND ADENOIDECTOMY       Current Meds  Medication Sig  . amLODipine (NORVASC) 5 MG tablet TAKE 1 TABLET BY MOUTH EVERY DAY  . atenolol (TENORMIN) 100 MG tablet TAKE 1 TABLET BY MOUTH EVERY DAY  . atorvastatin (LIPITOR) 40 MG tablet TAKE 1 TABLET(40 MG) BY MOUTH DAILY  . Coenzyme Q10 (COQ10) 200 MG CAPS Take 1 capsule by mouth daily.   . hydroxypropyl methylcellulose / hypromellose (ISOPTO TEARS / GONIOVISC) 2.5 % ophthalmic solution Place 1 drop into both eyes 4 (four) times daily as needed for dry eyes.  Marland Kitchen lisinopril (PRINIVIL,ZESTRIL) 40 MG tablet TAKE 1 TABLET(40 MG) BY MOUTH DAILY *KEEP APPT FOR MORE REFILLS**  . nitroGLYCERIN (NITROSTAT) 0.4 MG SL tablet Dissolve 1 tablet under the tongue every 5 minutes as needed  . warfarin (COUMADIN) 5 MG tablet TAKE 1 TABLET BY MOUTH DAILY OR AS DIRECTED BY ANTICOAGULATION CLINIC.     Allergies:   Other and Simvastatin   Social History   Tobacco Use  . Smoking status: Former Smoker    Packs/day: 1.00    Years: 60.00    Pack years: 60.00    Types: Cigarettes    Last attempt to quit: 03/25/1998    Years since quitting: 20.1  . Smokeless tobacco: Never Used  Substance Use Topics  . Alcohol use: Yes    Alcohol/week: 7.0 standard drinks    Types: 7 Glasses of wine per week    Comment: 7 glasses of red wine per week   . Drug use: No     Family Hx: The patient's family history includes AAA (abdominal aortic aneurysm) in his brother;  Heart attack in his father; Heart disease in his father and mother.  ROS:   Please see the history of present illness.     All other systems reviewed and are negative.   Prior CV studies:   The following studies were reviewed today:    Labs/Other Tests and Data Reviewed:    Recent Labs: 06/29/2017: ALT 19; BUN 17; Creatinine, Ser 0.82; Hemoglobin 14.6; Platelets 219.0; Potassium 5.0; Sodium 138; TSH 2.50   Recent Lipid Panel Lab Results  Component Value Date/Time   CHOL 146 06/29/2017 11:18 AM   TRIG 93.0 06/29/2017 11:18 AM   TRIG 68 01/10/2006 11:08 AM   HDL 47.40 06/29/2017 11:18 AM   CHOLHDL 3 06/29/2017 11:18 AM   LDLCALC 80 06/29/2017 11:18 AM   LDLDIRECT 131.5 04/22/2010 10:18 AM    Wt Readings from Last 3 Encounters:  05/23/18 217 lb (98.4 kg)  05/10/18 216 lb 6.4 oz (98.2 kg)  04/10/18 217 lb 12.8 oz (98.8 kg)     Objective:    Vital Signs:  BP 131/71 (BP Location: Left Arm, Patient Position: Sitting)   Pulse 76   Ht 6' (1.829 m)   Wt 217 lb (98.4 kg)   BMI 29.43 kg/m    well-nourished well-developed in no acute distress Vital signs reviewed Neuro/Psychiatric, judgment and thought processes are intact and coherent, alert and oriented x3, mood and affect appear normal.  ASSESSMENT & PLAN:    1. Atrial fibrillation persistent asymptomatic rate controlled continue his beta-blocker and anticoagulant.  He will have a repeat INR in our warfarin clinic 2. Stable continue his current anticoagulant goal INR 2.5 3. CAD stable after remote PCI and stent asymptomatic New York Heart Association class I and continue current medical therapy including lipid-lowering and beta-blocker 4. Hypertension stable continue current treatment including ACE inhibitor beta-blocker 5. Hyperlipidemia stable continue a statin lipids are ideal   COVID-19 Education: The signs and symptoms of COVID-19 were discussed with the patient and how to seek care for testing (follow up with PCP  or arrange E-visit).  The importance of social distancing was discussed today.  Time:   Today, I have spent 24 minutes with the patient with telehealth technology discussing the above problems.     Medication Adjustments/Labs and Tests Ordered: Current medicines are reviewed at length with the patient today.  Concerns regarding medicines are outlined above.  Tests Ordered: No orders of the defined types were placed in this encounter.  Medication Changes: No orders of the defined types were placed in this encounter.   Disposition:  Follow up Septmeber 2020  Signed, Shirlee More, MD  05/23/2018 2:53 PM    [place]

## 2018-05-23 NOTE — Patient Instructions (Addendum)
Medication Instructions:  Your physician recommends that you continue on your current medications as directed. Please refer to the Current Medication list given to you today.  If you need a refill on your cardiac medications before your next appointment, please call your pharmacy.   Lab work: None  If you have labs (blood work) drawn today and your tests are completely normal, you will receive your results only by: Marland Kitchen MyChart Message (if you have MyChart) OR . A paper copy in the mail If you have any lab test that is abnormal or we need to change your treatment, we will call you to review the results.  Testing/Procedures: None  Follow-Up: At [facility], you and your health needs are our priority.  As part of our continuing mission to provide you with exceptional heart care, we have created designated Provider Care Teams.  These Care Teams include your primary Cardiologist (physician) and Advanced Practice Providers (APPs -  Physician Assistants and Nurse Practitioners) who all work together to provide you with the care you need, when you need it. You will need a follow up appointment in 5 months: Wednesday, 10/24/2018, at 3:00 pm in the [facility] office. Please arrive at 2:45 pm.

## 2018-05-30 ENCOUNTER — Other Ambulatory Visit: Payer: Self-pay | Admitting: General Practice

## 2018-05-30 ENCOUNTER — Other Ambulatory Visit: Payer: Self-pay

## 2018-05-30 ENCOUNTER — Ambulatory Visit (INDEPENDENT_AMBULATORY_CARE_PROVIDER_SITE_OTHER): Payer: Medicare HMO | Admitting: General Practice

## 2018-05-30 DIAGNOSIS — Z7901 Long term (current) use of anticoagulants: Secondary | ICD-10-CM

## 2018-05-30 LAB — POCT INR: INR: 3 (ref 2.0–3.0)

## 2018-05-30 MED ORDER — WARFARIN SODIUM 5 MG PO TABS: ORAL_TABLET | ORAL | 1 refills | Status: DC

## 2018-05-30 NOTE — Progress Notes (Signed)
I have reviewed the results and agree with this plan   

## 2018-05-30 NOTE — Patient Instructions (Addendum)
Pre visit review using our clinic review tool, if applicable. No additional management support is needed unless otherwise documented below in the visit note.  Continue to take 1 tablet daily.  Re-check in 6 weeks.  

## 2018-07-05 ENCOUNTER — Encounter: Payer: Self-pay | Admitting: Family Medicine

## 2018-07-05 ENCOUNTER — Ambulatory Visit (INDEPENDENT_AMBULATORY_CARE_PROVIDER_SITE_OTHER): Payer: Medicare HMO | Admitting: Family Medicine

## 2018-07-05 ENCOUNTER — Other Ambulatory Visit: Payer: Self-pay

## 2018-07-05 VITALS — BP 126/78 | HR 72 | Temp 97.9°F | Ht 72.0 in | Wt 211.6 lb

## 2018-07-05 DIAGNOSIS — E785 Hyperlipidemia, unspecified: Secondary | ICD-10-CM | POA: Diagnosis not present

## 2018-07-05 DIAGNOSIS — Z0001 Encounter for general adult medical examination with abnormal findings: Secondary | ICD-10-CM | POA: Diagnosis not present

## 2018-07-05 DIAGNOSIS — I4819 Other persistent atrial fibrillation: Secondary | ICD-10-CM

## 2018-07-05 DIAGNOSIS — R739 Hyperglycemia, unspecified: Secondary | ICD-10-CM | POA: Diagnosis not present

## 2018-07-05 DIAGNOSIS — I10 Essential (primary) hypertension: Secondary | ICD-10-CM

## 2018-07-05 LAB — COMPREHENSIVE METABOLIC PANEL
ALT: 16 U/L (ref 0–53)
AST: 20 U/L (ref 0–37)
Albumin: 3.9 g/dL (ref 3.5–5.2)
Alkaline Phosphatase: 61 U/L (ref 39–117)
BUN: 13 mg/dL (ref 6–23)
CO2: 28 mEq/L (ref 19–32)
Calcium: 9.5 mg/dL (ref 8.4–10.5)
Chloride: 105 mEq/L (ref 96–112)
Creatinine, Ser: 0.85 mg/dL (ref 0.40–1.50)
GFR: 87.06 mL/min (ref >60.00)
Glucose, Bld: 95 mg/dL (ref 70–99)
Potassium: 4.5 mEq/L (ref 3.5–5.1)
Sodium: 141 mEq/L (ref 135–145)
Total Bilirubin: 1 mg/dL (ref 0.2–1.2)
Total Protein: 6.4 g/dL (ref 6.0–8.3)

## 2018-07-05 LAB — CBC
HCT: 47.1 % (ref 39.0–52.0)
Hemoglobin: 15.9 g/dL (ref 13.0–17.0)
MCHC: 33.8 g/dL (ref 30.0–36.0)
MCV: 87.1 fl (ref 78.0–100.0)
Platelets: 208 10*3/uL (ref 150.0–400.0)
RBC: 5.41 Mil/uL (ref 4.22–5.81)
RDW: 15.1 % (ref 11.5–15.5)
WBC: 6.8 10*3/uL (ref 4.0–10.5)

## 2018-07-05 LAB — LIPID PANEL
Cholesterol: 134 mg/dL (ref 0–200)
HDL: 49.3 mg/dL (ref >39.00)
LDL Cholesterol: 66 mg/dL (ref 0–99)
NonHDL: 85.1
Total CHOL/HDL Ratio: 3
Triglycerides: 96 mg/dL (ref 0.0–149.0)
VLDL: 19.2 mg/dL (ref 0.0–40.0)

## 2018-07-05 LAB — TSH: TSH: 2.78 u[IU]/mL (ref 0.35–4.50)

## 2018-07-05 LAB — HEMOGLOBIN A1C: Hgb A1c MFr Bld: 6 % (ref 4.6–6.5)

## 2018-07-05 NOTE — Assessment & Plan Note (Signed)
Stable.  Continue Lipitor 40 mg daily.  Check lipid panel.

## 2018-07-05 NOTE — Assessment & Plan Note (Signed)
At goal.  Continue amlodipine 5 mg daily, atenolol 100 mg daily, and lisinopril 40 mg daily.  Check C met, TSH, and CBC.

## 2018-07-05 NOTE — Assessment & Plan Note (Signed)
Rate controlled.  Anticoagulated on warfarin.  Continue management per cardiology.

## 2018-07-05 NOTE — Progress Notes (Signed)
Chief Complaint:  William Norman is a 78 y.o. male who presents today for his annual comprehensive physical exam.    Assessment/Plan:  Persistent atrial fibrillation Rate controlled.  Anticoagulated on warfarin.  Continue management per cardiology.  Essential hypertension At goal.  Continue amlodipine 5 mg daily, atenolol 100 mg daily, and lisinopril 40 mg daily.  Check C met, TSH, and CBC.  Dyslipidemia Stable.  Continue Lipitor 40 mg daily.  Check lipid panel.  Hyperglycemia Check A1c.  Preventative Healthcare: Up-to-date on vaccines and immunizations.  Patient Counseling(The following topics were reviewed and/or handout was given):  -Nutrition: Stressed importance of moderation in sodium/caffeine intake, saturated fat and cholesterol, caloric balance, sufficient intake of fresh fruits, vegetables, and fiber.  -Stressed the importance of regular exercise.   -Substance Abuse: Discussed cessation/primary prevention of tobacco, alcohol, or other drug use; driving or other dangerous activities under the influence; availability of treatment for abuse.   -Injury prevention: Discussed safety belts, safety helmets, smoke detector, smoking near bedding or upholstery.   -Sexuality: Discussed sexually transmitted diseases, partner selection, use of condoms, avoidance of unintended pregnancy and contraceptive alternatives.   -Dental health: Discussed importance of regular tooth brushing, flossing, and dental visits.  -Health maintenance and immunizations reviewed. Please refer to Health maintenance section.  Return to care in 1 year for next preventative visit.     Subjective:  HPI:  He has no acute complaints today.   His stable, chronic medical conditions are outlined below:   # Essential Hypertension - On norvasc 49m daily, lisinopril 40 mg daily and atenolol 1092mdaily.  Tolerating well without side effects. - ROS: No reported chest pain or shortness of breath.   #  Dyslipidemia - On lipitor 4053maily and tolerating well - ROS: No reported myalgias  # Afib anticoagulated on warfarin - Follows with cardiology  Lifestyle Diet: No specific diets or eating plans. Tries to eat a health, balanced diet.  Exercise: Does a lot of yard work.   [facility]  Decreased Interest 0  Down, Depressed, Hopeless 0  PHQ - 2 Score 0   ROS: Per HPI, otherwise a complete review of systems was negative.   PMH:  The following were reviewed and entered/updated in epic: Past Medical History:  Diagnosis Date  . BPH (benign prostatic hypertrophy)   . BPH associated with nocturia 08/29/2013  . CAD (coronary artery disease)   . CAD (coronary artery disease), native coronary artery    PTCA of RCA 1986, PTCA of LAD 1992,  Negative treadmill Cardiolite 2011   . Chronic dermatitis of hands 10/12/2015  . Chronic venous insufficiency   . DVT (deep venous thrombosis) ([facility] . Dyslipidemia   . Eczema   . Erectile dysfunction 07/11/2012  . Essential hypertension   . GERD 08/04/2006      . GERD (gastroesophageal reflux disease)   . Hyperlipidemia   . Hypertension   . Long term current use of anticoagulant therapy   . Lumbar disc disease   . Overweight (BMI 25.0-29.9) 06/18/2015  . Personal history of DVT (deep vein thrombosis)    Initially in 1998 with recurrence in 2002 now on chronic warfarin   . Soft tissue lesion of foot 08/27/2015   Patient Active Problem List   Diagnosis Date Noted  . Persistent atrial fibrillation 04/10/2018  . Chronic dermatitis of hands 10/12/2015  . Soft tissue lesion of foot 08/27/2015  . Overweight (BMI 25.0-29.9) 06/18/2015  . BPH associated  with nocturia 08/29/2013  . CAD (coronary artery disease), native coronary artery   . Long term current use of anticoagulant therapy   . Personal history of DVT (deep vein thrombosis)   . Chronic venous insufficiency   . Erectile dysfunction 07/11/2012  . Lumbar disc disease    . GERD 08/04/2006  . Dyslipidemia   . Essential hypertension    Past Surgical History:  Procedure Laterality Date  . CARDIAC CATHETERIZATION    . CARPAL TUNNEL RELEASE  04/21/2011   Procedure: CARPAL TUNNEL RELEASE;  Surgeon: Tennis Must, MD;  Location: [place];  Service: Orthopedics;  Laterality: Left;  . CARPAL TUNNEL RELEASE  05/23/2011   Procedure: CARPAL TUNNEL RELEASE;  Surgeon: Tennis Must, MD;  Location: [place];  Service: Orthopedics;  Laterality: Right;  . COLON SURGERY  2010 and 2011 colostomy reversal  . CORONARY ANGIOPLASTY    . NASAL SEPTUM SURGERY    . precutaneous transluminal coronary angioplasty    . right hernia    . TONSILLECTOMY AND ADENOIDECTOMY      Family History  Problem Relation Age of Onset  . Heart disease Father   . Heart attack Father   . Heart disease Mother   . AAA (abdominal aortic aneurysm) Brother     Medications- reviewed and updated Current Outpatient Medications  Medication Sig Dispense Refill  . amLODipine (NORVASC) 5 MG tablet TAKE 1 TABLET BY MOUTH EVERY DAY 90 tablet 1  . atenolol (TENORMIN) 100 MG tablet TAKE 1 TABLET BY MOUTH EVERY DAY 90 tablet 1  . atorvastatin (LIPITOR) 40 MG tablet TAKE 1 TABLET(40 MG) BY MOUTH DAILY 90 tablet 1  . Coenzyme Q10 (COQ10) 200 MG CAPS Take 1 capsule by mouth daily.     . hydroxypropyl methylcellulose / hypromellose (ISOPTO TEARS / GONIOVISC) 2.5 % ophthalmic solution Place 1 drop into both eyes 4 (four) times daily as needed for dry eyes.    Marland Kitchen lisinopril (PRINIVIL,ZESTRIL) 40 MG tablet TAKE 1 TABLET(40 MG) BY MOUTH DAILY *KEEP APPT FOR MORE REFILLS** 90 tablet 0  . nitroGLYCERIN (NITROSTAT) 0.4 MG SL tablet Dissolve 1 tablet under the tongue every 5 minutes as needed 90 tablet 1  . warfarin (COUMADIN) 5 MG tablet TAKE 1 TABLET BY MOUTH DAILY OR AS DIRECTED BY ANTICOAGULATION CLINIC. 100 tablet 1   No current facility-administered medications for this visit.      Allergies-reviewed and updated Allergies  Allergen Reactions  . Other Other (See Comments)    Some form of numbing medication used by dentist, almost died  . Simvastatin Other (See Comments)    Muscle aches     Social History   Socioeconomic History  . Marital status: Married    Spouse name: Not on file  . Number of children: 3  . Years of education: 71  . Highest education level: Not on file  Occupational History  . Occupation: Retired  Scientific laboratory technician  . Financial resource strain: Not on file  . Food insecurity:    Worry: Not on file    Inability: Not on file  . Transportation needs:    Medical: Not on file    Non-medical: Not on file  Tobacco Use  . Smoking status: Former Smoker    Packs/day: 1.00    Years: 60.00    Pack years: 60.00    Types: Cigarettes    Last attempt to quit: 03/25/1998    Years since quitting: 20.2  . Smokeless tobacco: Never  Used  Substance and Sexual Activity  . Alcohol use: Yes    Alcohol/week: 7.0 standard drinks    Types: 7 Glasses of wine per week    Comment: 7 glasses of red wine per week   . Drug use: No  . Sexual activity: Not on file  Lifestyle  . Physical activity:    Days per week: Not on file    Minutes per session: Not on file  . Stress: Not on file  Relationships  . Social connections:    Talks on phone: Not on file    Gets together: Not on file    Attends religious service: Not on file    Active member of club or organization: Not on file    Attends meetings of clubs or organizations: Not on file    Relationship status: Not on file  Other Topics Concern  . Not on file  Social History Narrative   Fun: Working outside in yard, cutting wood    Denies abuse and feels safe at home.         Objective:  Physical Exam: BP 126/78 (BP Location: Left Arm, Patient Position: Sitting, Cuff Size: Normal)   Pulse 72   Temp 97.9 F (36.6 C) (Oral)   Ht 6' (1.829 m)   Wt 211 lb 9.6 oz (96 kg)   SpO2 96%   BMI 28.70 kg/m    Body mass index is 28.7 kg/m. Wt Readings from Last 3 Encounters:  07/05/18 211 lb 9.6 oz (96 kg)  05/23/18 217 lb (98.4 kg)  05/10/18 216 lb 6.4 oz (98.2 kg)   Gen: NAD, resting comfortably HEENT: TMs normal bilaterally. OP clear. No thyromegaly noted.  CV: Irregular with no murmurs appreciated Pulm: NWOB, CTAB with no crackles, wheezes, or rhonchi GI: Normal bowel sounds present. Soft, Nontender, Nondistended. MSK: no edema, cyanosis, or clubbing noted Skin: warm, dry Neuro: CN2-12 grossly intact. Strength 5/5 in upper and lower extremities. Reflexes symmetric and intact bilaterally.  Psych: Normal affect and thought content     Aubrey Voong M. [name], MD 07/05/2018 3:36 PM

## 2018-07-05 NOTE — Patient Instructions (Signed)
It was very nice to see you today!  Keep up the good work! No changes today. We will check blood work.  Come back in 1 year for your next physical or sooner as needed.   Take care, Dr [name]   Preventive Care 79 Years and Older, Male Preventive care refers to lifestyle choices and visits with your health care provider that can promote health and wellness. What does preventive care include?   A yearly physical exam. This is also called an annual well check.  Dental exams once or twice a year.  Routine eye exams. Ask your health care provider how often you should have your eyes checked.  Personal lifestyle choices, including: ? Daily care of your teeth and gums. ? Regular physical activity. ? Eating a healthy diet. ? Avoiding tobacco and drug use. ? Limiting alcohol use. ? Practicing safe sex. ? Taking low doses of aspirin every day. ? Taking vitamin and mineral supplements as recommended by your health care provider. What happens during an annual well check? The services and screenings done by your health care provider during your annual well check will depend on your age, overall health, lifestyle risk factors, and family history of disease. Counseling Your health care provider may ask you questions about your:  Alcohol use.  Tobacco use.  Drug use.  Emotional well-being.  Home and relationship well-being.  Sexual activity.  Eating habits.  History of falls.  Memory and ability to understand (cognition).  Work and work Statistician. Screening You may have the following tests or measurements:  Height, weight, and BMI.  Blood pressure.  Lipid and cholesterol levels. These may be checked every 5 years, or more frequently if you are over 39 years old.  Skin check.  Lung cancer screening. You may have this screening every year starting at age 79 if you have a 30-pack-year history of smoking and currently smoke or have quit within the past 15 years.   Colorectal cancer screening. All adults should have this screening starting at age 30 and continuing until age 79. You will have tests every 1-10 years, depending on your results and the type of screening test. People at increased risk should start screening at an earlier age. Screening tests may include: ? Guaiac-based fecal occult blood testing. ? Fecal immunochemical test (FIT). ? Stool DNA test. ? Virtual colonoscopy. ? Sigmoidoscopy. During this test, a flexible tube with a tiny camera (sigmoidoscope) is used to examine your rectum and lower colon. The sigmoidoscope is inserted through your anus into your rectum and lower colon. ? Colonoscopy. During this test, a long, thin, flexible tube with a tiny camera (colonoscope) is used to examine your entire colon and rectum.  Prostate cancer screening. Recommendations will vary depending on your family history and other risks.  Hepatitis C blood test.  Hepatitis B blood test.  Sexually transmitted disease (STD) testing.  Diabetes screening. This is done by checking your blood sugar (glucose) after you have not eaten for a while (fasting). You may have this done every 1-3 years.  Abdominal aortic aneurysm (AAA) screening. You may need this if you are a current or former smoker.  Osteoporosis. You may be screened starting at age 75 if you are at high risk. Talk with your health care provider about your test results, treatment options, and if necessary, the need for more tests. Vaccines Your health care provider may recommend certain vaccines, such as:  Influenza vaccine. This is recommended every year.  Tetanus, diphtheria, and  acellular pertussis (Tdap, Td) vaccine. You may need a Td booster every 10 years.  Varicella vaccine. You may need this if you have not been vaccinated.  Zoster vaccine. You may need this after age 44.  Measles, mumps, and rubella (MMR) vaccine. You may need at least one dose of MMR if you were born in 1957 or  later. You may also need a second dose.  Pneumococcal 13-valent conjugate (PCV13) vaccine. One dose is recommended after age 37.  Pneumococcal polysaccharide (PPSV23) vaccine. One dose is recommended after age 29.  Meningococcal vaccine. You may need this if you have certain conditions.  Hepatitis A vaccine. You may need this if you have certain conditions or if you travel or work in places where you may be exposed to hepatitis A.  Hepatitis B vaccine. You may need this if you have certain conditions or if you travel or work in places where you may be exposed to hepatitis B.  Haemophilus influenzae type b (Hib) vaccine. You may need this if you have certain risk factors. Talk to your health care provider about which screenings and vaccines you need and how often you need them. This information is not intended to replace advice given to you by your health care provider. Make sure you discuss any questions you have with your health care provider. Document Released: 02/27/2015 Document Revised: 03/23/2017 Document Reviewed: 12/02/2014 Elsevier Interactive Patient Education  2019 [employer].

## 2018-07-06 ENCOUNTER — Telehealth: Payer: Self-pay | Admitting: Family Medicine

## 2018-07-06 NOTE — Telephone Encounter (Signed)
Copied from [place] [id number]. Topic: General - Other >> Jul 06, 2018 10:55 AM Leward Quan A wrote: Reason for CRM: Patient called to speak with Dr [name] in regards to his pulse from yesterdays visit. States that his paper work show his pulse at a 56 and he was informed to contact his heart Dr but he need to know from Dr [name] if he should be concerned about this. Please call patient at Ph# [phone]

## 2018-07-06 NOTE — Progress Notes (Signed)
Please inform patient of the following:  His A1c is up just a little but but still well below the diabetic range. All of his other blood work is NORMAL.   Would like for him to keep up the good work and we can recheck in a year.  William Norman. [name], MD 07/06/2018 4:07 PM

## 2018-07-06 NOTE — Telephone Encounter (Signed)
I have answered the patient in the [place] question he asked earlier.

## 2018-07-11 ENCOUNTER — Other Ambulatory Visit: Payer: Self-pay | Admitting: Family Medicine

## 2018-07-11 ENCOUNTER — Other Ambulatory Visit: Payer: Self-pay

## 2018-07-11 ENCOUNTER — Ambulatory Visit (INDEPENDENT_AMBULATORY_CARE_PROVIDER_SITE_OTHER): Payer: Medicare HMO | Admitting: General Practice

## 2018-07-11 DIAGNOSIS — Z7901 Long term (current) use of anticoagulants: Secondary | ICD-10-CM | POA: Diagnosis not present

## 2018-07-11 DIAGNOSIS — I10 Essential (primary) hypertension: Secondary | ICD-10-CM

## 2018-07-11 LAB — POCT INR: INR: 3.4 — AB (ref 2.0–3.0)

## 2018-07-11 NOTE — Progress Notes (Signed)
I have reviewed the results and agree with this plan   

## 2018-07-11 NOTE — Patient Instructions (Incomplete)
Pre visit review using our clinic review tool, if applicable. No additional management support is needed unless otherwise documented below in the visit note.  Hold coumadin today (5/27) and then continue to take 1 tablet daily. Re-check in 4 weeks.

## 2018-07-17 ENCOUNTER — Ambulatory Visit: Payer: Medicare HMO

## 2018-07-18 ENCOUNTER — Ambulatory Visit: Payer: Medicare HMO

## 2018-07-23 ENCOUNTER — Other Ambulatory Visit: Payer: Self-pay

## 2018-07-23 ENCOUNTER — Ambulatory Visit: Payer: Medicare HMO | Admitting: Podiatry

## 2018-07-23 VITALS — HR 97

## 2018-07-23 DIAGNOSIS — M79676 Pain in unspecified toe(s): Secondary | ICD-10-CM | POA: Diagnosis not present

## 2018-07-23 DIAGNOSIS — B351 Tinea unguium: Secondary | ICD-10-CM | POA: Diagnosis not present

## 2018-07-29 NOTE — Progress Notes (Signed)
   SUBJECTIVE Patient presents to office today complaining of elongated, thickened nails that cause pain while ambulating in shoes. He is unable to trim his own nails. Patient is here for further evaluation and treatment.  Past Medical History:  Diagnosis Date  . BPH (benign prostatic hypertrophy)   . BPH associated with nocturia 08/29/2013  . CAD (coronary artery disease)   . CAD (coronary artery disease), native coronary artery    PTCA of RCA 1986, PTCA of LAD 1992,  Negative treadmill Cardiolite 2011   . Chronic dermatitis of hands 10/12/2015  . Chronic venous insufficiency   . DVT (deep venous thrombosis) ([place])   . Dyslipidemia   . Eczema   . Erectile dysfunction 07/11/2012  . Essential hypertension   . GERD 08/04/2006      . GERD (gastroesophageal reflux disease)   . Hyperlipidemia   . Hypertension   . Long term current use of anticoagulant therapy   . Lumbar disc disease   . Overweight (BMI 25.0-29.9) 06/18/2015  . Personal history of DVT (deep vein thrombosis)    Initially in 1998 with recurrence in 2002 now on chronic warfarin   . Soft tissue lesion of foot 08/27/2015    OBJECTIVE General Patient is awake, alert, and oriented x 3 and in no acute distress. Derm Skin is dry and supple bilateral. Negative open lesions or macerations. Remaining integument unremarkable. Nails are tender, long, thickened and dystrophic with subungual debris, consistent with onychomycosis, 1-5 bilateral. No signs of infection noted. Vasc  DP and PT pedal pulses palpable bilaterally. Temperature gradient within normal limits.  Neuro Epicritic and protective threshold sensation grossly intact bilaterally.  Musculoskeletal Exam No symptomatic pedal deformities noted bilateral. Muscular strength within normal limits.  ASSESSMENT 1. Onychodystrophic nails 1-5 bilateral with hyperkeratosis of nails.  2. Onychomycosis of nail due to dermatophyte bilateral 3. Pain in foot bilateral  PLAN OF CARE 1.  Patient evaluated today.  2. Instructed to maintain good pedal hygiene and foot care.  3. Mechanical debridement of nails 1-5 bilaterally performed using a nail nipper. Filed with dremel without incident.  4. Return to clinic in 3 mos.    Edrick Kins, DPM [facility]  Dr. Edrick Kins, [place]                                        [place], [place] [address]                Office [phone]  Fax [phone]

## 2018-08-08 ENCOUNTER — Ambulatory Visit: Payer: Medicare HMO

## 2018-08-13 ENCOUNTER — Other Ambulatory Visit: Payer: Self-pay

## 2018-08-13 ENCOUNTER — Ambulatory Visit (INDEPENDENT_AMBULATORY_CARE_PROVIDER_SITE_OTHER): Payer: Medicare HMO | Admitting: General Practice

## 2018-08-13 DIAGNOSIS — Z7901 Long term (current) use of anticoagulants: Secondary | ICD-10-CM

## 2018-08-13 LAB — POCT INR: INR: 2.9 (ref 2.0–3.0)

## 2018-08-13 NOTE — Patient Instructions (Incomplete)
Pre visit review using our clinic review tool, if applicable. No additional management support is needed unless otherwise documented below in the visit note.  Continue to take 1 tablet daily.  Re-check in 4 weeks.  

## 2018-08-22 ENCOUNTER — Other Ambulatory Visit: Payer: Self-pay

## 2018-08-22 MED ORDER — ATENOLOL 100 MG PO TABS: 100.0000 mg | ORAL_TABLET | Freq: Every day | ORAL | 1 refills | Status: DC

## 2018-09-06 DIAGNOSIS — H52203 Unspecified astigmatism, bilateral: Secondary | ICD-10-CM | POA: Diagnosis not present

## 2018-09-06 DIAGNOSIS — Z961 Presence of intraocular lens: Secondary | ICD-10-CM | POA: Diagnosis not present

## 2018-09-06 DIAGNOSIS — H35342 Macular cyst, hole, or pseudohole, left eye: Secondary | ICD-10-CM | POA: Diagnosis not present

## 2018-09-10 DIAGNOSIS — H43812 Vitreous degeneration, left eye: Secondary | ICD-10-CM | POA: Diagnosis not present

## 2018-09-10 DIAGNOSIS — Z961 Presence of intraocular lens: Secondary | ICD-10-CM | POA: Diagnosis not present

## 2018-09-10 DIAGNOSIS — H35342 Macular cyst, hole, or pseudohole, left eye: Secondary | ICD-10-CM | POA: Diagnosis not present

## 2018-09-12 ENCOUNTER — Ambulatory Visit: Payer: Medicare HMO

## 2018-09-25 ENCOUNTER — Telehealth: Payer: Self-pay | Admitting: Cardiology

## 2018-09-25 DIAGNOSIS — H35342 Macular cyst, hole, or pseudohole, left eye: Secondary | ICD-10-CM | POA: Diagnosis not present

## 2018-09-25 DIAGNOSIS — H43813 Vitreous degeneration, bilateral: Secondary | ICD-10-CM | POA: Diagnosis not present

## 2018-09-25 DIAGNOSIS — H35431 Paving stone degeneration of retina, right eye: Secondary | ICD-10-CM | POA: Diagnosis not present

## 2018-09-25 NOTE — Telephone Encounter (Signed)
Patient having eye surgery 8-20 and needs to go off blood thinner prior, will this be ok? Dr. Naoma Diener (Eye dr) will need permission from [place] to do this.

## 2018-09-26 ENCOUNTER — Telehealth: Payer: Self-pay | Admitting: Cardiology

## 2018-09-26 NOTE — Telephone Encounter (Signed)
Left message to return call 

## 2018-09-26 NOTE — Telephone Encounter (Signed)
Duplicate encounter. Please see previous encounter.  

## 2018-09-26 NOTE — Telephone Encounter (Signed)
Patient confirmed that he is having a macular hole procedure with Dr. Sherlynn Stalls in [place] on 10/04/2018. Called Dr. [place] Cancer' office and requested a cardiac clearance form be sent for Dr. Bettina Gavia to review and advise. Melissa confirmed fax number and will send it.

## 2018-09-27 NOTE — Telephone Encounter (Signed)
Reviewed the chart. William Norman has a PMH of CAD, persistent atrial fibrillation on chronic anticoagulation, DVT. No recent CAD, TIA symptoms noted.  He is scheduled for vitrectomy, membrane peel, gas fluid exchange under MAC anesthesia.  Safe to stop Warfarin 7 days prior to surgery scheduled 10/04/18. Dr. [place] [facility] of [facility] will inform the Mr. Zentz when to resume Warfarin after surgery.   Will forward this encounter to Dr. Joycelyn Das office.   Shirlee More, MD

## 2018-10-04 DIAGNOSIS — H35342 Macular cyst, hole, or pseudohole, left eye: Secondary | ICD-10-CM | POA: Diagnosis not present

## 2018-10-05 ENCOUNTER — Other Ambulatory Visit: Payer: Self-pay | Admitting: *Deleted

## 2018-10-05 DIAGNOSIS — Z20822 Contact with and (suspected) exposure to covid-19: Secondary | ICD-10-CM

## 2018-10-05 DIAGNOSIS — H35342 Macular cyst, hole, or pseudohole, left eye: Secondary | ICD-10-CM | POA: Diagnosis not present

## 2018-10-06 LAB — NOVEL CORONAVIRUS, NAA: SARS-CoV-2, NAA: NOT DETECTED

## 2018-10-10 DIAGNOSIS — H35342 Macular cyst, hole, or pseudohole, left eye: Secondary | ICD-10-CM | POA: Diagnosis not present

## 2018-10-15 DIAGNOSIS — R69 Illness, unspecified: Secondary | ICD-10-CM | POA: Diagnosis not present

## 2018-10-16 ENCOUNTER — Other Ambulatory Visit: Payer: Self-pay | Admitting: Family Medicine

## 2018-10-23 NOTE — Progress Notes (Signed)
Cardiology Office Note:    Date:  10/24/2018   ID:  William Norman, DOB [birth year], MRN [id number]  PCP:  Vivi Barrack, MD  Cardiologist:  Shirlee More, MD    Referring MD: Vivi Barrack, MD    ASSESSMENT:    1. Persistent atrial fibrillation   2. Long term current use of anticoagulant therapy   3. Hypertensive heart disease without heart failure   4. Dyslipidemia    PLAN:    In order of problems listed above:  1. And persistent age-indeterminate atrial fibrillation rate controlled continue his beta-blocker anticoagulant.  At this time I would not advise resuming sinus rhythm I suspect to be ineffective I would not improve the quality of his life 2. Continue his anticoagulant 3. Stable decrease his calcium channel blocker with his peripheral edema 4. Continue statin check liver function renal function potassium lipid profile   Next appointment: 6 months   Medication Adjustments/Labs and Tests Ordered: Current medicines are reviewed at length with the patient today.  Concerns regarding medicines are outlined above.  No orders of the defined types were placed in this encounter.  No orders of the defined types were placed in this encounter.   Chief Complaint  Patient presents with  . Follow-up  . Atrial Fibrillation    History of Present Illness:    William Norman is a 78 y.o. male with a hx of CAD with PCI of LAD in 1992 and RCA 1986 DVT on chronic coumadin hypertension and hyperlipidemia He was asymptomatic but found to be in persistent rate controlled atrial fibrillation. Echocardiogram 04/18/2018 showed normal left ventricular size and function EF 60 to 65% the right ventricle is normal there was moderate to severe left atrial and right atrial enlargement.  Mild aortic and moderate mitral regurgitation was noted.  Quantitative evaluation of mitral regurgitation was not performed and there is no message of reflux into the pulmonary vein.  A 7-day ZIO  monitor showed atrial fibrillation with a controlled rate.  He was last seen 05/23/2018. Compliance with diet, lifestyle and medications: Yes  Overall he is doing well he has chronic edema takes a calcium channel blocker BP is in range and I think we can decrease to see if it helps his peripheral edema.  He has no awareness of atrial fibrillation no bleeding complications anticoagulant at this time I would not advise attempts to resume sinus rhythm.  He has had no shortness of breath orthopnea chest pain or syncope. Past Medical History:  Diagnosis Date  . BPH (benign prostatic hypertrophy)   . BPH associated with nocturia 08/29/2013  . CAD (coronary artery disease)   . CAD (coronary artery disease), native coronary artery    PTCA of RCA 1986, PTCA of LAD 1992,  Negative treadmill Cardiolite 2011   . Chronic dermatitis of hands 10/12/2015  . Chronic venous insufficiency   . DVT (deep venous thrombosis) ([place])   . Dyslipidemia   . Eczema   . Erectile dysfunction 07/11/2012  . Essential hypertension   . GERD 08/04/2006      . GERD (gastroesophageal reflux disease)   . Hyperlipidemia   . Hypertension   . Long term current use of anticoagulant therapy   . Lumbar disc disease   . Overweight (BMI 25.0-29.9) 06/18/2015  . Personal history of DVT (deep vein thrombosis)    Initially in 1998 with recurrence in 2002 now on chronic warfarin   . Soft tissue lesion of foot 08/27/2015  Past Surgical History:  Procedure Laterality Date  . CARDIAC CATHETERIZATION    . CARPAL TUNNEL RELEASE  04/21/2011   Procedure: CARPAL TUNNEL RELEASE;  Surgeon: Tennis Must, MD;  Location: [facility];  Service: Orthopedics;  Laterality: Left;  . CARPAL TUNNEL RELEASE  05/23/2011   Procedure: CARPAL TUNNEL RELEASE;  Surgeon: Tennis Must, MD;  Location: [facility];  Service: Orthopedics;  Laterality: Right;  . COLON SURGERY  2010 and 2011 colostomy reversal  . CORONARY ANGIOPLASTY    .  EYE SURGERY     repair of macular hole  . NASAL SEPTUM SURGERY    . precutaneous transluminal coronary angioplasty    . right hernia    . TONSILLECTOMY AND ADENOIDECTOMY      Current Medications: Current Meds  Medication Sig  . amLODipine (NORVASC) 5 MG tablet TAKE 1 TABLET BY MOUTH EVERY DAY  . atenolol (TENORMIN) 100 MG tablet Take 1 tablet (100 mg total) by mouth daily.  Marland Kitchen atorvastatin (LIPITOR) 40 MG tablet Take 20 mg by mouth daily.  . Coenzyme Q10 (COQ10) 200 MG CAPS Take 1 capsule by mouth daily.   . hydroxypropyl methylcellulose / hypromellose (ISOPTO TEARS / GONIOVISC) 2.5 % ophthalmic solution Place 1 drop into both eyes 4 (four) times daily as needed for dry eyes.  Marland Kitchen lisinopril (ZESTRIL) 40 MG tablet TAKE 1 TABLE BY MOUTH DAILY  . nitroGLYCERIN (NITROSTAT) 0.4 MG SL tablet Dissolve 1 tablet under the tongue every 5 minutes as needed  . warfarin (COUMADIN) 5 MG tablet TAKE 1 TABLET BY MOUTH DAILY OR AS DIRECTED BY ANTICOAGULATION CLINIC.     Allergies:   Other and Simvastatin   Social History   Socioeconomic History  . Marital status: Married    Spouse name: Not on file  . Number of children: 3  . Years of education: 24  . Highest education level: Not on file  Occupational History  . Occupation: Retired  Scientific laboratory technician  . Financial resource strain: Not on file  . Food insecurity    Worry: Not on file    Inability: Not on file  . Transportation needs    Medical: Not on file    Non-medical: Not on file  Tobacco Use  . Smoking status: Former Smoker    Packs/day: 1.00    Years: 60.00    Pack years: 60.00    Types: Cigarettes    Quit date: 03/25/1998    Years since quitting: 20.5  . Smokeless tobacco: Never Used  Substance and Sexual Activity  . Alcohol use: Yes    Alcohol/week: 7.0 standard drinks    Types: 7 Glasses of wine per week    Comment: 7 glasses of red wine per week   . Drug use: No  . Sexual activity: Not on file  Lifestyle  . Physical activity     Days per week: Not on file    Minutes per session: Not on file  . Stress: Not on file  Relationships  . Social Herbalist on phone: Not on file    Gets together: Not on file    Attends religious service: Not on file    Active member of club or organization: Not on file    Attends meetings of clubs or organizations: Not on file    Relationship status: Not on file  Other Topics Concern  . Not on file  Social History Narrative   Fun: Working outside in yard,  cutting wood    Denies abuse and feels safe at home.      Family History: The patient's family history includes AAA (abdominal aortic aneurysm) in his brother; Heart attack in his father; Heart disease in his father and mother. ROS:   Please see the history of present illness.    All other systems reviewed and are negative.  EKGs/Labs/Other Studies Reviewed:    The following studies were reviewed today:  EKG:  EKG ordered today and personally reviewed.  The ekg ordered today demonstrates rate controlled atrial fibrillation  Recent Labs: 07/05/2018: ALT 16; BUN 13; Creatinine, Ser 0.85; Hemoglobin 15.9; Platelets 208.0; Potassium 4.5; Sodium 141; TSH 2.78  Recent Lipid Panel    Component Value Date/Time   CHOL 134 07/05/2018 1414   TRIG 96.0 07/05/2018 1414   TRIG 68 01/10/2006 1108   HDL 49.30 07/05/2018 1414   CHOLHDL 3 07/05/2018 1414   VLDL 19.2 07/05/2018 1414   LDLCALC 66 07/05/2018 1414   LDLDIRECT 131.5 04/22/2010 1018    Physical Exam:    VS:  BP 128/84 (BP Location: Right Arm, Patient Position: Sitting, Cuff Size: Large)   Pulse 65   Temp (!) 97.5 F (36.4 C)   Ht 6' (1.829 m)   Wt 208 lb 6.4 oz (94.5 kg)   SpO2 96%   BMI 28.26 kg/m     Wt Readings from Last 3 Encounters:  10/24/18 208 lb 6.4 oz (94.5 kg)  07/05/18 211 lb 9.6 oz (96 kg)  05/23/18 217 lb (98.4 kg)     GEN:  Well nourished, well developed in no acute distress HEENT: Normal NECK: No JVD; No carotid bruits  LYMPHATICS: No lymphadenopathy CARDIAC: Irregular rhythm S1 variable RRR, no murmurs, rubs, gallops RESPIRATORY:  Clear to auscultation without rales, wheezing or rhonchi  ABDOMEN: Soft, non-tender, non-distended MUSCULOSKELETAL: He has bilateral nonpitting predominantly ankles lower extremities towards the ankle doughy edema bilateral edema; No deformity  SKIN: Warm and dry NEUROLOGIC:  Alert and oriented x 3 PSYCHIATRIC:  Normal affect    Signed, Shirlee More, MD  10/24/2018 3:49 PM    [facility]

## 2018-10-24 ENCOUNTER — Other Ambulatory Visit: Payer: Self-pay

## 2018-10-24 ENCOUNTER — Ambulatory Visit (INDEPENDENT_AMBULATORY_CARE_PROVIDER_SITE_OTHER): Payer: Medicare HMO | Admitting: Cardiology

## 2018-10-24 ENCOUNTER — Encounter: Payer: Self-pay | Admitting: Cardiology

## 2018-10-24 VITALS — BP 128/84 | HR 65 | Temp 97.5°F | Ht 72.0 in | Wt 208.4 lb

## 2018-10-24 DIAGNOSIS — I10 Essential (primary) hypertension: Secondary | ICD-10-CM

## 2018-10-24 DIAGNOSIS — Z7901 Long term (current) use of anticoagulants: Secondary | ICD-10-CM

## 2018-10-24 DIAGNOSIS — I4819 Other persistent atrial fibrillation: Secondary | ICD-10-CM | POA: Diagnosis not present

## 2018-10-24 DIAGNOSIS — E785 Hyperlipidemia, unspecified: Secondary | ICD-10-CM | POA: Diagnosis not present

## 2018-10-24 DIAGNOSIS — I119 Hypertensive heart disease without heart failure: Secondary | ICD-10-CM | POA: Diagnosis not present

## 2018-10-24 MED ORDER — AMLODIPINE BESYLATE 5 MG PO TABS: 5.0000 mg | ORAL_TABLET | ORAL | 1 refills | Status: DC

## 2018-10-24 NOTE — Patient Instructions (Signed)
Medication Instructions:  Your physician has recommended you make the following change in your medication:   DECREASE amlodipine (norvasc) 5 mg: Take 1 tablet every other day   If you need a refill on your cardiac medications before your next appointment, please call your pharmacy.   Lab work: Your physician recommends that you return for lab work today: CMP, lipid panel.   If you have labs (blood work) drawn today and your tests are completely normal, you will receive your results only by: Marland Kitchen MyChart Message (if you have MyChart) OR . A paper copy in the mail If you have any lab test that is abnormal or we need to change your treatment, we will call you to review the results.  Testing/Procedures: You had an EKG today.   Follow-Up: At [facility], you and your health needs are our priority.  As part of our continuing mission to provide you with exceptional heart care, we have created designated Provider Care Teams.  These Care Teams include your primary Cardiologist (physician) and Advanced Practice Providers (APPs -  Physician Assistants and Nurse Practitioners) who all work together to provide you with the care you need, when you need it. You will need a follow up appointment in 6 months.  Please call our office 2 months in advance to schedule this appointment.

## 2018-10-24 NOTE — Addendum Note (Signed)
Addended by: Austin Miles on: 10/24/2018 03:58 PM   Modules accepted: Orders

## 2018-10-25 LAB — LIPID PANEL
Chol/HDL Ratio: 3 ratio (ref 0.0–5.0)
Cholesterol, Total: 132 mg/dL (ref 100–199)
HDL: 44 mg/dL (ref >39)
LDL Chol Calc (NIH): 69 mg/dL (ref 0–99)
Triglycerides: 103 mg/dL (ref 0–149)
VLDL Cholesterol Cal: 19 mg/dL (ref 5–40)

## 2018-10-25 LAB — COMPREHENSIVE METABOLIC PANEL
ALT: 17 IU/L (ref 0–44)
AST: 26 IU/L (ref 0–40)
Albumin/Globulin Ratio: 1.8 (ref 1.2–2.2)
Albumin: 4.2 g/dL (ref 3.7–4.7)
Alkaline Phosphatase: 58 IU/L (ref 39–117)
BUN/Creatinine Ratio: 22 (ref 10–24)
BUN: 19 mg/dL (ref 8–27)
Bilirubin Total: 0.7 mg/dL (ref 0.0–1.2)
CO2: 23 mmol/L (ref 20–29)
Calcium: 9.5 mg/dL (ref 8.6–10.2)
Chloride: 103 mmol/L (ref 96–106)
Creatinine, Ser: 0.88 mg/dL (ref 0.76–1.27)
GFR calc Af Amer: 95 mL/min/{1.73_m2} (ref >59)
GFR calc non Af Amer: 82 mL/min/{1.73_m2} (ref >59)
Globulin, Total: 2.4 g/dL (ref 1.5–4.5)
Glucose: 98 mg/dL (ref 65–99)
Potassium: 4.4 mmol/L (ref 3.5–5.2)
Sodium: 139 mmol/L (ref 134–144)
Total Protein: 6.6 g/dL (ref 6.0–8.5)

## 2018-10-26 ENCOUNTER — Encounter: Payer: Self-pay | Admitting: Podiatry

## 2018-10-26 ENCOUNTER — Other Ambulatory Visit: Payer: Self-pay

## 2018-10-26 ENCOUNTER — Ambulatory Visit (INDEPENDENT_AMBULATORY_CARE_PROVIDER_SITE_OTHER): Payer: Medicare HMO | Admitting: Podiatry

## 2018-10-26 DIAGNOSIS — D689 Coagulation defect, unspecified: Secondary | ICD-10-CM

## 2018-10-26 DIAGNOSIS — B351 Tinea unguium: Secondary | ICD-10-CM

## 2018-10-26 DIAGNOSIS — M79676 Pain in unspecified toe(s): Secondary | ICD-10-CM | POA: Diagnosis not present

## 2018-10-26 HISTORY — DX: Coagulation defect, unspecified: D68.9

## 2018-10-26 NOTE — Progress Notes (Signed)
Complaint:  Visit Type: Patient returns to my office for continued preventative foot care services. Complaint: Patient states" my nails have grown long and thick and become painful to walk and wear shoes" Patient has been treated with coumadin.. The patient presents for preventative foot care services. No changes to ROS  Podiatric Exam: Vascular: dorsalis pedis and posterior tibial pulses are palpable bilateral. Capillary return is immediate. Temperature gradient is WNL. Skin turgor WNL  Sensorium: Normal Semmes Weinstein monofilament test. Normal tactile sensation bilaterally. Nail Exam: Pt has thick disfigured discolored nails with subungual debris noted bilateral entire nail hallux through fifth toenails Ulcer Exam: There is no evidence of ulcer or pre-ulcerative changes or infection. Orthopedic Exam: Muscle tone and strength are WNL. No limitations in general ROM. No crepitus or effusions noted. Foot type and digits show no abnormalities. Bony prominences are unremarkable. Skin: No Porokeratosis. No infection or ulcers  Diagnosis:  Onychomycosis, , Pain in right toe, pain in left toes  Treatment & Plan Procedures and Treatment: Consent by patient was obtained for treatment procedures.   Debridement of mycotic and hypertrophic toenails, 1 through 5 bilateral and clearing of subungual debris. No ulceration, no infection noted.  Return Visit-Office Procedure: Patient instructed to return to the office for a follow up visit 3 months for continued evaluation and treatment.    Gardiner Barefoot DPM

## 2018-10-31 DIAGNOSIS — H35342 Macular cyst, hole, or pseudohole, left eye: Secondary | ICD-10-CM | POA: Diagnosis not present

## 2018-11-25 ENCOUNTER — Other Ambulatory Visit: Payer: Self-pay | Admitting: Family Medicine

## 2018-11-25 DIAGNOSIS — Z7901 Long term (current) use of anticoagulants: Secondary | ICD-10-CM

## 2018-11-28 ENCOUNTER — Encounter: Payer: Self-pay | Admitting: Gastroenterology

## 2018-12-14 ENCOUNTER — Telehealth: Payer: Self-pay | Admitting: Family Medicine

## 2018-12-14 NOTE — Telephone Encounter (Signed)
I left a message asking the patient to call and schedule Medicare AWV with Loma Sousa ([place]).  If patient calls back, please schedule Medicare Wellness Visit at next available opening. [place] AWV 07/13/17 VDM ([place])

## 2019-01-07 ENCOUNTER — Other Ambulatory Visit: Payer: Self-pay | Admitting: Family Medicine

## 2019-01-25 ENCOUNTER — Ambulatory Visit: Payer: Medicare HMO | Admitting: Podiatry

## 2019-02-18 ENCOUNTER — Telehealth: Payer: Self-pay | Admitting: General Practice

## 2019-02-18 NOTE — Telephone Encounter (Signed)
LMOVM for pt to call [name],  RN for INR appointment.

## 2019-02-19 DIAGNOSIS — R69 Illness, unspecified: Secondary | ICD-10-CM | POA: Diagnosis not present

## 2019-02-22 ENCOUNTER — Other Ambulatory Visit: Payer: Self-pay

## 2019-02-22 ENCOUNTER — Telehealth: Payer: Self-pay

## 2019-02-22 MED ORDER — ATENOLOL 100 MG PO TABS: 100.0000 mg | ORAL_TABLET | Freq: Every day | ORAL | 1 refills | Status: DC

## 2019-02-22 NOTE — Telephone Encounter (Signed)
Patient came to the office because [facility] that have fax the office twice for patient medication. Patient need a refill for atenolol to be sent to [facility].

## 2019-03-01 ENCOUNTER — Ambulatory Visit: Payer: Medicare Other | Attending: Internal Medicine

## 2019-03-01 DIAGNOSIS — Z23 Encounter for immunization: Secondary | ICD-10-CM | POA: Insufficient documentation

## 2019-03-01 NOTE — Progress Notes (Signed)
   Covid-19 Vaccination Clinic  Name:  William Norman    MRN: [id number] DOB: November 01, 1940  03/01/2019  William Norman was observed post Covid-19 immunization for 30 minutes based on pre-vaccination screening without incidence. He was provided with Vaccine Information Sheet and instruction to access the V-Safe system.   William Norman was instructed to call 911 with any severe reactions post vaccine: Marland Kitchen Difficulty breathing  . Swelling of your face and throat  . A fast heartbeat  . A bad rash all over your body  . Dizziness and weakness    Immunizations Administered    Name Date Dose VIS Date Route   Pfizer COVID-19 Vaccine 03/01/2019 11:32 AM 0.3 mL 01/25/2019 Intramuscular   Manufacturer: [place]   Lot: [id number]   [place]: [id number]

## 2019-03-21 ENCOUNTER — Ambulatory Visit: Payer: Medicare HMO | Attending: Internal Medicine

## 2019-03-21 DIAGNOSIS — Z23 Encounter for immunization: Secondary | ICD-10-CM | POA: Insufficient documentation

## 2019-03-21 NOTE — Progress Notes (Signed)
   Covid-19 Vaccination Clinic  Name:  William Norman    MRN: [id number] DOB: 1940/06/28  03/21/2019  [name] was observed post Covid-19 immunization for 15 minutes without incidence. He was provided with Vaccine Information Sheet and instruction to access the V-Safe system.   [name] was instructed to call 911 with any severe reactions post vaccine: Marland Kitchen Difficulty breathing  . Swelling of your face and throat  . A fast heartbeat  . A bad rash all over your body  . Dizziness and weakness    Immunizations Administered    Name Date Dose VIS Date Route   Pfizer COVID-19 Vaccine 03/21/2019 12:38 PM 0.3 mL 01/25/2019 Intramuscular   Manufacturer: [place]   Lot: [id number]   [place]: [id number]

## 2019-03-31 ENCOUNTER — Other Ambulatory Visit: Payer: Self-pay | Admitting: Family Medicine

## 2019-04-02 ENCOUNTER — Other Ambulatory Visit: Payer: Self-pay | Admitting: Family Medicine

## 2019-04-02 DIAGNOSIS — I10 Essential (primary) hypertension: Secondary | ICD-10-CM

## 2019-04-02 NOTE — Telephone Encounter (Signed)
MEDICATION: Amlodipine 5 MG  PHARMACY: [facility]  Comments:   **Let patient know to contact pharmacy at the end of the day to make sure medication is ready. **  ** Please notify patient to allow 48-72 hours to process**  **Encourage patient to contact the pharmacy for refills or they can request refills through [facility]**

## 2019-04-03 ENCOUNTER — Other Ambulatory Visit: Payer: Self-pay

## 2019-04-03 ENCOUNTER — Ambulatory Visit: Payer: Medicare HMO | Admitting: Podiatry

## 2019-04-03 ENCOUNTER — Encounter: Payer: Self-pay | Admitting: Podiatry

## 2019-04-03 DIAGNOSIS — M79676 Pain in unspecified toe(s): Secondary | ICD-10-CM | POA: Diagnosis not present

## 2019-04-03 DIAGNOSIS — D689 Coagulation defect, unspecified: Secondary | ICD-10-CM

## 2019-04-03 DIAGNOSIS — B351 Tinea unguium: Secondary | ICD-10-CM | POA: Diagnosis not present

## 2019-04-03 MED ORDER — AMLODIPINE BESYLATE 5 MG PO TABS: 5.0000 mg | ORAL_TABLET | ORAL | 1 refills | Status: DC

## 2019-04-03 NOTE — Progress Notes (Signed)
Complaint:  Visit Type: Patient returns to my office for continued preventative foot care services. Complaint: Patient states" my nails have grown long and thick and become painful to walk and wear shoes" Patient has been treated with coumadin.. The patient presents for preventative foot care services. No changes to ROS  Podiatric Exam: Vascular: dorsalis pedis and posterior tibial pulses are palpable bilateral. Capillary return is immediate. Temperature gradient is WNL. Skin turgor WNL  Sensorium: Normal Semmes Weinstein monofilament test. Normal tactile sensation bilaterally. Nail Exam: Pt has thick disfigured discolored nails with subungual debris noted bilateral entire nail hallux through fifth toenails Ulcer Exam: There is no evidence of ulcer or pre-ulcerative changes or infection. Orthopedic Exam: Muscle tone and strength are WNL. No limitations in general ROM. No crepitus or effusions noted. Foot type and digits show no abnormalities. Bony prominences are unremarkable. Skin: No Porokeratosis. No infection or ulcers  Diagnosis:  Onychomycosis, , Pain in right toe, pain in left toes  Treatment & Plan Procedures and Treatment: Consent by patient was obtained for treatment procedures.   Debridement of mycotic and hypertrophic toenails, 1 through 5 bilateral and clearing of subungual debris. No ulceration, no infection noted.  Return Visit-Office Procedure: Patient instructed to return to the office for a follow up visit 3 months for continued evaluation and treatment.    Gardiner Barefoot DPM

## 2019-04-03 NOTE — Addendum Note (Signed)
Addended by: Francella Solian on: 04/03/2019 10:57 AM   Modules accepted: Orders

## 2019-04-03 NOTE — Telephone Encounter (Signed)
Last seen by you on 07/05/2018 no f/u app made. Script given in past by cardiology ok to refill?

## 2019-04-04 ENCOUNTER — Telehealth: Payer: Self-pay | Admitting: Family Medicine

## 2019-04-04 NOTE — Telephone Encounter (Signed)
I left a message asking the patient to call and schedule Medicare AWV with Loma Sousa ([place]).  If patient calls back, please schedule Medicare Wellness Visit at next available opening.. Last AWV 07/13/2017 VDM ([place])

## 2019-04-05 ENCOUNTER — Other Ambulatory Visit: Payer: Self-pay

## 2019-04-05 DIAGNOSIS — I10 Essential (primary) hypertension: Secondary | ICD-10-CM

## 2019-04-05 MED ORDER — AMLODIPINE BESYLATE 5 MG PO TABS: 5.0000 mg | ORAL_TABLET | ORAL | 1 refills | Status: DC

## 2019-04-09 ENCOUNTER — Other Ambulatory Visit: Payer: Self-pay

## 2019-04-10 ENCOUNTER — Ambulatory Visit (INDEPENDENT_AMBULATORY_CARE_PROVIDER_SITE_OTHER): Payer: Medicare HMO | Admitting: General Practice

## 2019-04-10 DIAGNOSIS — Z7901 Long term (current) use of anticoagulants: Secondary | ICD-10-CM | POA: Diagnosis not present

## 2019-04-10 LAB — POCT INR: INR: 3 (ref 2.0–3.0)

## 2019-04-10 NOTE — Progress Notes (Signed)
I have reviewed the results and agree with this plan   

## 2019-04-10 NOTE — Patient Instructions (Addendum)
Pre visit review using our clinic review tool, if applicable. No additional management support is needed unless otherwise documented below in the visit note.  Continue to take 1 tablet daily.  Re-check in 6 weeks.  

## 2019-04-12 ENCOUNTER — Other Ambulatory Visit: Payer: Self-pay

## 2019-04-12 ENCOUNTER — Ambulatory Visit (INDEPENDENT_AMBULATORY_CARE_PROVIDER_SITE_OTHER): Payer: Medicare HMO

## 2019-04-12 VITALS — BP 126/74 | HR 85 | Temp 97.2°F | Ht 72.0 in | Wt 214.4 lb

## 2019-04-12 DIAGNOSIS — Z0000 Encounter for general adult medical examination without abnormal findings: Secondary | ICD-10-CM

## 2019-04-12 NOTE — Progress Notes (Addendum)
Subjective:   Shiquan Rapisarda is a 79 y.o. male who presents for Medicare Annual/Subsequent preventive examination.  Review of Systems:   Cardiac Risk Factors include: advanced age (>21men, >70 women);male gender;hypertension;dyslipidemia    Objective:    Vitals: BP 126/74   Pulse 85   Temp (!) 97.2 F (36.2 C) (Temporal)   Ht 6' (1.829 m)   Wt 214 lb 6.4 oz (97.3 kg)   SpO2 95%   BMI 29.08 kg/m   Body mass index is 29.08 kg/m.  Advanced Directives 04/12/2019 07/13/2017 03/21/2014 05/19/2011  Does Patient Have a Medical Advance Directive? No No No Patient does not have advance directive  Would patient like information on creating a medical advance directive? No - Patient declined - - -    Tobacco Social History   Tobacco Use  Smoking Status Former Smoker  . Packs/day: 1.00  . Years: 60.00  . Pack years: 60.00  . Types: Cigarettes  . Quit date: 03/25/1998  . Years since quitting: 21.0  Smokeless Tobacco Never Used     Counseling given: Not Answered   Clinical Intake:  Pre-visit preparation completed: Yes  Pain : No/denies pain  Diabetes: No  How often do you need to have someone help you when you read instructions, pamphlets, or other written materials from your doctor or pharmacy?: 1 - Never  Interpreter Needed?: No  Information entered by :: Denman George LPN  Past Medical History:  Diagnosis Date  . BPH (benign prostatic hypertrophy)   . BPH associated with nocturia 08/29/2013  . CAD (coronary artery disease)   . CAD (coronary artery disease), native coronary artery    PTCA of RCA 1986, PTCA of LAD 1992,  Negative treadmill Cardiolite 2011   . Chronic dermatitis of hands 10/12/2015  . Chronic venous insufficiency   . DVT (deep venous thrombosis) ([place])   . Dyslipidemia   . Eczema   . Erectile dysfunction 07/11/2012  . Essential hypertension   . GERD 08/04/2006      . GERD (gastroesophageal reflux disease)   . Hyperlipidemia   . Hypertension     . Long term current use of anticoagulant therapy   . Lumbar disc disease   . Overweight (BMI 25.0-29.9) 06/18/2015  . Personal history of DVT (deep vein thrombosis)    Initially in 1998 with recurrence in 2002 now on chronic warfarin   . Soft tissue lesion of foot 08/27/2015   Past Surgical History:  Procedure Laterality Date  . CARDIAC CATHETERIZATION    . CARPAL TUNNEL RELEASE  04/21/2011   Procedure: CARPAL TUNNEL RELEASE;  Surgeon: Tennis Must, MD;  Location: [facility];  Service: Orthopedics;  Laterality: Left;  . CARPAL TUNNEL RELEASE  05/23/2011   Procedure: CARPAL TUNNEL RELEASE;  Surgeon: Tennis Must, MD;  Location: [facility];  Service: Orthopedics;  Laterality: Right;  . COLON SURGERY  2010 and 2011 colostomy reversal  . CORONARY ANGIOPLASTY    . EYE SURGERY     repair of macular hole  . NASAL SEPTUM SURGERY    . precutaneous transluminal coronary angioplasty    . right hernia    . TONSILLECTOMY AND ADENOIDECTOMY     Family History  Problem Relation Age of Onset  . Heart disease Father   . Heart attack Father   . Heart disease Mother   . AAA (abdominal aortic aneurysm) Brother    Social History   Socioeconomic History  . Marital status: Married  Spouse name: Not on file  . Number of children: 3  . Years of education: 1  . Highest education level: Not on file  Occupational History  . Occupation: Retired  Tobacco Use  . Smoking status: Former Smoker    Packs/day: 1.00    Years: 60.00    Pack years: 60.00    Types: Cigarettes    Quit date: 03/25/1998    Years since quitting: 21.0  . Smokeless tobacco: Never Used  Substance and Sexual Activity  . Alcohol use: Yes    Alcohol/week: 7.0 standard drinks    Types: 7 Glasses of wine per week    Comment: 7 glasses of red wine per week   . Drug use: No  . Sexual activity: Not on file  Other Topics Concern  . Not on file  Social History Narrative   Fun: Working outside in yard,  cutting wood    Denies abuse and feels safe at home.    Social Determinants of Health   Financial Resource Strain:   . Difficulty of Paying Living Expenses: Not on file  Food Insecurity:   . Worried About Charity fundraiser in the Last Year: Not on file  . Ran Out of Food in the Last Year: Not on file  Transportation Needs:   . Lack of Transportation (Medical): Not on file  . Lack of Transportation (Non-Medical): Not on file  Physical Activity:   . Days of Exercise per Week: Not on file  . Minutes of Exercise per Session: Not on file  Stress:   . Feeling of Stress : Not on file  Social Connections:   . Frequency of Communication with Friends and Family: Not on file  . Frequency of Social Gatherings with Friends and Family: Not on file  . Attends Religious Services: Not on file  . Active Member of Clubs or Organizations: Not on file  . Attends Archivist Meetings: Not on file  . Marital Status: Not on file    Outpatient Encounter Medications as of 04/12/2019  Medication Sig  . amLODipine (NORVASC) 5 MG tablet Take 1 tablet (5 mg total) by mouth every other day.  Marland Kitchen atenolol (TENORMIN) 100 MG tablet Take 1 tablet (100 mg total) by mouth daily.  Marland Kitchen atorvastatin (LIPITOR) 40 MG tablet TAKE 1 TABLET BY MOUTH EVERY DAY  . Coenzyme Q10 (COQ10) 200 MG CAPS Take 1 capsule by mouth daily.   . hydroxypropyl methylcellulose / hypromellose (ISOPTO TEARS / GONIOVISC) 2.5 % ophthalmic solution Place 1 drop into both eyes 4 (four) times daily as needed for dry eyes.  Marland Kitchen lisinopril (ZESTRIL) 40 MG tablet TAKE 1 TABLET BY MOUTH EVERY DAY  . nitroGLYCERIN (NITROSTAT) 0.4 MG SL tablet Dissolve 1 tablet under the tongue every 5 minutes as needed  . warfarin (COUMADIN) 5 MG tablet TAKE 1 TABLET BY MOUTH DAILY OR AS DIRECTED BY ANTICOAGULATION CLINIC.   No facility-administered encounter medications on file as of 04/12/2019.    Activities of Daily Living In your present state of health, do  you have any difficulty performing the following activities: 04/12/2019  Hearing? N  Vision? Y  Difficulty concentrating or making decisions? N  Walking or climbing stairs? N  Dressing or bathing? N  Doing errands, shopping? N  Preparing Food and eating ? N  Using the Toilet? N  In the past six months, have you accidently leaked urine? N  Do you have problems with loss of bowel control? N  Managing  your Medications? N  Managing your Finances? N  Housekeeping or managing your Housekeeping? N  Some recent data might be hidden    Patient Care Team: Vivi Barrack, MD as PCP - General (Family Medicine) Jacolyn Reedy, MD as Consulting Physician (Cardiology) Rutherford Guys, MD as Consulting Physician (Ophthalmology) Bettina Gavia Hilton Cork, MD as Consulting Physician (Cardiology) Edrick Kins, DPM as Consulting Physician (Podiatry)   Assessment:   This is a routine wellness examination for Xander.  Exercise Activities and Dietary recommendations Current Exercise Habits: The patient does not participate in regular exercise at present  Goals    . patient (pt-stated)     Wants to continue to stay active to enjoy life        Fall Risk Fall Risk  04/12/2019 07/13/2017 06/29/2017 06/20/2016 06/18/2015  Falls in the past year? 0 No No No No  Number falls in past yr: 0 - - - -  Injury with Fall? 0 - - - -  Follow up Falls evaluation completed;Education provided;Falls prevention discussed - - - -   Is the patient's home free of loose throw rugs in walkways, pet beds, electrical cords, etc?   yes      Grab bars in the bathroom? yes      Handrails on the stairs?   yes      Adequate lighting?   yes  Timed Get Up and Go Performed: completed and within normal timeframe; no gait abnormalities noted   Depression Screen PHQ 2/9 Scores 04/12/2019 07/13/2017 06/29/2017 06/20/2016  PHQ - 2 Score 0 0 0 0    Cognitive Function MMSE - Mini Mental State Exam 07/13/2017 06/09/2015  Not completed: (No Data)  (No Data)     6CIT Screen 07/13/2017  What Year? 0 points  What month? 0 points  What time? 0 points  Count back from 20 0 points  Months in reverse 0 points  Repeat phrase 0 points  Total Score 0    Immunization History  Administered Date(s) Administered  . Fluad Quad(high Dose 65+) 10/15/2018  . Influenza Split 11/16/2012  . Influenza Whole 02/14/2005, 11/20/2007, 11/14/2009  . Influenza, High Dose Seasonal PF 10/21/2014, 10/12/2015, 11/23/2017  . Influenza,inj,Quad PF,6+ Mos 10/10/2013  . PFIZER SARS-COV-2 Vaccination 03/01/2019, 03/21/2019  . Pneumococcal Conjugate-13 10/21/2014  . Pneumococcal Polysaccharide-23 02/15/2003, 02/18/2008  . Td 02/15/2004, 10/21/2014  . Zoster 03/09/2010    Qualifies for Shingles Vaccine? Discussed and patient will check with pharmacy for coverage.  Patient education handout provided   Screening Tests Health Maintenance  Topic Date Due  . TETANUS/TDAP  10/20/2024  . INFLUENZA VACCINE  Completed  . PNA vac Low Risk Adult  Completed   Cancer Screenings: Lung: Low Dose CT Chest recommended if Age 2-80 years, 30 pack-year currently smoking OR have quit w/in 15years. Patient does not qualify. Colorectal: No longer indicated      Plan:  I have personally reviewed and addressed the Medicare Annual Wellness questionnaire and have noted the following in the patient's chart:  A. Medical and social history B. Use of alcohol, tobacco or illicit drugs  C. Current medications and supplements D. Functional ability and status E.  Nutritional status F.  Physical activity G. Advance directives H. List of other physicians I.  Hospitalizations, surgeries, and ER visits in previous 12 months J.  [place] such as hearing and vision if needed, cognitive and depression L. Referrals, records requested, and appointments- none   In addition, I have reviewed and  discussed with patient certain preventive protocols, quality metrics, and best  practice recommendations. A written personalized care plan for preventive services as well as general preventive health recommendations were provided to patient.   Signed,  Denman George, LPN  Nurse Health Advisor   Nurse Notes: Patient has completed Covid vaccine   I have reviewed documentation for AWV and Advance Care planning provided by [facility], I agree with documentation, I was immediately available for any questions. Inda Coke, [place]

## 2019-04-12 NOTE — Patient Instructions (Signed)
William Norman , Thank you for taking time to come for your Medicare Wellness Visit. I appreciate your ongoing commitment to your health goals. Please review the following plan we discussed and let me know if I can assist you in the future.   Screening recommendations/referrals: Colorectal Screening: up to date; no longer indicated   Vision and Dental Exams: Recommended annual ophthalmology exams for early detection of glaucoma and other disorders of the eye Recommended annual dental exams for proper oral hygiene  Vaccinations: Influenza vaccine: up to date; last 10/15/18 Pneumococcal vaccine: up to date; last 10/21/14 Tdap vaccine: up to date; last 10/21/14 Shingles vaccine: Please call your insurance company to determine your out of pocket expense for the Shingrix vaccine. You may receive this vaccine at your local pharmacy. (see handout)   Advanced directives: Please bring a copy of your POA (Power of Attorney) and/or Living Will to your next appointment.  Goals: Recommend to drink at least 6-8 8oz glasses of water per day and consume a balanced diet rich in fresh fruits and vegetables.   Next appointment: Please schedule your Annual Wellness Visit with your Nurse Health Advisor in one year.  Preventive Care 80 Years and Older, Male Preventive care refers to lifestyle choices and visits with your health care provider that can promote health and wellness. What does preventive care include?  A yearly physical exam. This is also called an annual well check.  Dental exams once or twice a year.  Routine eye exams. Ask your health care provider how often you should have your eyes checked.  Personal lifestyle choices, including:  Daily care of your teeth and gums.  Regular physical activity.  Eating a healthy diet.  Avoiding tobacco and drug use.  Limiting alcohol use.  Practicing safe sex.  Taking low doses of aspirin every day if recommended by your health care  provider..  Taking vitamin and mineral supplements as recommended by your health care provider. What happens during an annual well check? The services and screenings done by your health care provider during your annual well check will depend on your age, overall health, lifestyle risk factors, and family history of disease. Counseling  Your health care provider may ask you questions about your:  Alcohol use.  Tobacco use.  Drug use.  Emotional well-being.  Home and relationship well-being.  Sexual activity.  Eating habits.  History of falls.  Memory and ability to understand (cognition).  Work and work Statistician. Screening  You may have the following tests or measurements:  Height, weight, and BMI.  Blood pressure.  Lipid and cholesterol levels. These may be checked every 5 years, or more frequently if you are over 60 years old.  Skin check.  Lung cancer screening. You may have this screening every year starting at age 80 if you have a 30-pack-year history of smoking and currently smoke or have quit within the past 15 years.  Fecal occult blood test (FOBT) of the stool. You may have this test every year starting at age 80.  Flexible sigmoidoscopy or colonoscopy. You may have a sigmoidoscopy every 5 years or a colonoscopy every 10 years starting at age 80.  Prostate cancer screening. Recommendations will vary depending on your family history and other risks.  Hepatitis C blood test.  Hepatitis B blood test.  Sexually transmitted disease (STD) testing.  Diabetes screening. This is done by checking your blood sugar (glucose) after you have not eaten for a while (fasting). You may have this done  every 1-3 years.  Abdominal aortic aneurysm (AAA) screening. You may need this if you are a current or former smoker.  Osteoporosis. You may be screened starting at age 80 if you are at high risk. Talk with your health care provider about your test results, treatment  options, and if necessary, the need for more tests. Vaccines  Your health care provider may recommend certain vaccines, such as:  Influenza vaccine. This is recommended every year.  Tetanus, diphtheria, and acellular pertussis (Tdap, Td) vaccine. You may need a Td booster every 10 years.  Zoster vaccine. You may need this after age 80.  Pneumococcal 13-valent conjugate (PCV13) vaccine. One dose is recommended after age 80.  Pneumococcal polysaccharide (PPSV23) vaccine. One dose is recommended after age 80. Talk to your health care provider about which screenings and vaccines you need and how often you need them. This information is not intended to replace advice given to you by your health care provider. Make sure you discuss any questions you have with your health care provider. Document Released: 02/27/2015 Document Revised: 10/21/2015 Document Reviewed: 12/02/2014 Elsevier Interactive Patient Education  2017 [employer] Prevention in the Home Falls can cause injuries. They can happen to people of all ages. There are many things you can do to make your home safe and to help prevent falls. What can I do on the outside of my home?  Regularly fix the edges of walkways and driveways and fix any cracks.  Remove anything that might make you trip as you walk through a door, such as a raised step or threshold.  Trim any bushes or trees on the path to your home.  Use bright outdoor lighting.  Clear any walking paths of anything that might make someone trip, such as rocks or tools.  Regularly check to see if handrails are loose or broken. Make sure that both sides of any steps have handrails.  Any raised decks and porches should have guardrails on the edges.  Have any leaves, snow, or ice cleared regularly.  Use sand or salt on walking paths during winter.  Clean up any spills in your garage right away. This includes oil or grease spills. What can I do in the  bathroom?  Use night lights.  Install grab bars by the toilet and in the tub and shower. Do not use towel bars as grab bars.  Use non-skid mats or decals in the tub or shower.  If you need to sit down in the shower, use a plastic, non-slip stool.  Keep the floor dry. Clean up any water that spills on the floor as soon as it happens.  Remove soap buildup in the tub or shower regularly.  Attach bath mats securely with double-sided non-slip rug tape.  Do not have throw rugs and other things on the floor that can make you trip. What can I do in the bedroom?  Use night lights.  Make sure that you have a light by your bed that is easy to reach.  Do not use any sheets or blankets that are too big for your bed. They should not hang down onto the floor.  Have a firm chair that has side arms. You can use this for support while you get dressed.  Do not have throw rugs and other things on the floor that can make you trip. What can I do in the kitchen?  Clean up any spills right away.  Avoid walking on wet floors.  Keep  items that you use a lot in easy-to-reach places.  If you need to reach something above you, use a strong step stool that has a grab bar.  Keep electrical cords out of the way.  Do not use floor polish or wax that makes floors slippery. If you must use wax, use non-skid floor wax.  Do not have throw rugs and other things on the floor that can make you trip. What can I do with my stairs?  Do not leave any items on the stairs.  Make sure that there are handrails on both sides of the stairs and use them. Fix handrails that are broken or loose. Make sure that handrails are as long as the stairways.  Check any carpeting to make sure that it is firmly attached to the stairs. Fix any carpet that is loose or worn.  Avoid having throw rugs at the top or bottom of the stairs. If you do have throw rugs, attach them to the floor with carpet tape.  Make sure that you have a  light switch at the top of the stairs and the bottom of the stairs. If you do not have them, ask someone to add them for you. What else can I do to help prevent falls?  Wear shoes that:  Do not have high heels.  Have rubber bottoms.  Are comfortable and fit you well.  Are closed at the toe. Do not wear sandals.  If you use a stepladder:  Make sure that it is fully opened. Do not climb a closed stepladder.  Make sure that both sides of the stepladder are locked into place.  Ask someone to hold it for you, if possible.  Clearly mark and make sure that you can see:  Any grab bars or handrails.  First and last steps.  Where the edge of each step is.  Use tools that help you move around (mobility aids) if they are needed. These include:  Canes.  Walkers.  Scooters.  Crutches.  Turn on the lights when you go into a dark area. Replace any light bulbs as soon as they burn out.  Set up your furniture so you have a clear path. Avoid moving your furniture around.  If any of your floors are uneven, fix them.  If there are any pets around you, be aware of where they are.  Review your medicines with your doctor. Some medicines can make you feel dizzy. This can increase your chance of falling. Ask your doctor what other things that you can do to help prevent falls. This information is not intended to replace advice given to you by your health care provider. Make sure you discuss any questions you have with your health care provider. Document Released: 11/27/2008 Document Revised: 07/09/2015 Document Reviewed: 03/07/2014 Elsevier Interactive Patient Education  2017 [employer].

## 2019-04-20 NOTE — Progress Notes (Deleted)
Cardiology Office Note:    Date:  04/20/2019   ID:  William Norman, DOB October 23, 1940, MRN [id number]  PCP:  Vivi Barrack, MD  Cardiologist:  Shirlee More, MD    Referring MD: Vivi Barrack, MD    ASSESSMENT:    No diagnosis found. PLAN:    In order of problems listed above:  1. ***   Next appointment: ***   Medication Adjustments/Labs and Tests Ordered: Current medicines are reviewed at length with the patient today.  Concerns regarding medicines are outlined above.  No orders of the defined types were placed in this encounter.  No orders of the defined types were placed in this encounter.   No chief complaint on file.   History of Present Illness:    William Norman is a 79 y.o. male with a hx of CAD with PCI of LAD in 1992 and RCA 1986 DVT on chronic coumadin hypertension and hyperlipidemia He was asymptomatic but found to be in persistent rate controlled atrial fibrillation.Echocardiogram 04/18/2018 showed normal left ventricular size and function EF 60 to 65% the right ventricle is normal there was moderate to severe left atrial and right atrial enlargement.  Mild aortic and moderate mitral regurgitation was noted He was last seen 10/24/2018. Compliance with diet, lifestyle and medications: *** Past Medical History:  Diagnosis Date  . BPH (benign prostatic hypertrophy)   . BPH associated with nocturia 08/29/2013  . CAD (coronary artery disease)   . CAD (coronary artery disease), native coronary artery    PTCA of RCA 1986, PTCA of LAD 1992,  Negative treadmill Cardiolite 2011   . Chronic dermatitis of hands 10/12/2015  . Chronic venous insufficiency   . DVT (deep venous thrombosis) ([place])   . Dyslipidemia   . Eczema   . Erectile dysfunction 07/11/2012  . Essential hypertension   . GERD 08/04/2006      . GERD (gastroesophageal reflux disease)   . Hyperlipidemia   . Hypertension   . Long term current use of anticoagulant therapy   . Lumbar disc  disease   . Overweight (BMI 25.0-29.9) 06/18/2015  . Personal history of DVT (deep vein thrombosis)    Initially in 1998 with recurrence in 2002 now on chronic warfarin   . Soft tissue lesion of foot 08/27/2015    Past Surgical History:  Procedure Laterality Date  . CARDIAC CATHETERIZATION    . CARPAL TUNNEL RELEASE  04/21/2011   Procedure: CARPAL TUNNEL RELEASE;  Surgeon: Tennis Must, MD;  Location: [facility];  Service: Orthopedics;  Laterality: Left;  . CARPAL TUNNEL RELEASE  05/23/2011   Procedure: CARPAL TUNNEL RELEASE;  Surgeon: Tennis Must, MD;  Location: [facility];  Service: Orthopedics;  Laterality: Right;  . COLON SURGERY  2010 and 2011 colostomy reversal  . CORONARY ANGIOPLASTY    . EYE SURGERY     repair of macular hole  . NASAL SEPTUM SURGERY    . precutaneous transluminal coronary angioplasty    . right hernia    . TONSILLECTOMY AND ADENOIDECTOMY      Current Medications: No outpatient medications have been marked as taking for the 04/22/19 encounter (Appointment) with Richardo Priest, MD.     Allergies:   Other and Simvastatin   Social History   Socioeconomic History  . Marital status: Married    Spouse name: Not on file  . Number of children: 3  . Years of education: 16  . Highest education level:  Not on file  Occupational History  . Occupation: Retired  Tobacco Use  . Smoking status: Former Smoker    Packs/day: 1.00    Years: 60.00    Pack years: 60.00    Types: Cigarettes    Quit date: 03/25/1998    Years since quitting: 21.0  . Smokeless tobacco: Never Used  Substance and Sexual Activity  . Alcohol use: Yes    Alcohol/week: 7.0 standard drinks    Types: 7 Glasses of wine per week    Comment: 7 glasses of red wine per week   . Drug use: No  . Sexual activity: Not on file  Other Topics Concern  . Not on file  Social History Narrative   Fun: Working outside in yard, cutting wood    Denies abuse and feels safe at home.     Social Determinants of Health   Financial Resource Strain:   . Difficulty of Paying Living Expenses: Not on file  Food Insecurity:   . Worried About Charity fundraiser in the Last Year: Not on file  . Ran Out of Food in the Last Year: Not on file  Transportation Needs:   . Lack of Transportation (Medical): Not on file  . Lack of Transportation (Non-Medical): Not on file  Physical Activity:   . Days of Exercise per Week: Not on file  . Minutes of Exercise per Session: Not on file  Stress:   . Feeling of Stress : Not on file  Social Connections:   . Frequency of Communication with Friends and Family: Not on file  . Frequency of Social Gatherings with Friends and Family: Not on file  . Attends Religious Services: Not on file  . Active Member of Clubs or Organizations: Not on file  . Attends Archivist Meetings: Not on file  . Marital Status: Not on file     Family History: The patient's ***family history includes AAA (abdominal aortic aneurysm) in his brother; Heart attack in his father; Heart disease in his father and mother. ROS:   Please see the history of present illness.    All other systems reviewed and are negative.  EKGs/Labs/Other Studies Reviewed:    The following studies were reviewed today:  EKG:  EKG ordered today and personally reviewed.  The ekg ordered today demonstrates ***  Recent Labs: 07/05/2018: Hemoglobin 15.9; Platelets 208.0; TSH 2.78 10/24/2018: ALT 17; BUN 19; Creatinine, Ser 0.88; Potassium 4.4; Sodium 139  Recent Lipid Panel    Component Value Date/Time   CHOL 132 10/24/2018 1607   TRIG 103 10/24/2018 1607   TRIG 68 01/10/2006 1108   HDL 44 10/24/2018 1607   CHOLHDL 3.0 10/24/2018 1607   CHOLHDL 3 07/05/2018 1414   VLDL 19.2 07/05/2018 1414   LDLCALC 69 10/24/2018 1607   LDLDIRECT 131.5 04/22/2010 1018    Physical Exam:    VS:  There were no vitals taken for this visit.    Wt Readings from Last 3 Encounters:  04/12/19 214  lb 6.4 oz (97.3 kg)  10/24/18 208 lb 6.4 oz (94.5 kg)  07/05/18 211 lb 9.6 oz (96 kg)     GEN: *** Well nourished, well developed in no acute distress HEENT: Normal NECK: No JVD; No carotid bruits LYMPHATICS: No lymphadenopathy CARDIAC: ***RRR, no murmurs, rubs, gallops RESPIRATORY:  Clear to auscultation without rales, wheezing or rhonchi  ABDOMEN: Soft, non-tender, non-distended MUSCULOSKELETAL:  No edema; No deformity  SKIN: Warm and dry NEUROLOGIC:  Alert and oriented  x 3 PSYCHIATRIC:  Normal affect    Signed, Shirlee More, MD  04/20/2019 11:20 AM    [place]

## 2019-04-22 ENCOUNTER — Ambulatory Visit (INDEPENDENT_AMBULATORY_CARE_PROVIDER_SITE_OTHER): Payer: Medicare HMO

## 2019-04-22 ENCOUNTER — Other Ambulatory Visit: Payer: Self-pay

## 2019-04-22 ENCOUNTER — Ambulatory Visit: Payer: Medicare HMO | Admitting: Cardiology

## 2019-04-22 ENCOUNTER — Ambulatory Visit (INDEPENDENT_AMBULATORY_CARE_PROVIDER_SITE_OTHER): Payer: Medicare HMO | Admitting: Family Medicine

## 2019-04-22 ENCOUNTER — Encounter: Payer: Self-pay | Admitting: Family Medicine

## 2019-04-22 VITALS — BP 130/80 | HR 84 | Temp 98.1°F | Ht 72.0 in | Wt 211.8 lb

## 2019-04-22 DIAGNOSIS — S2232XA Fracture of one rib, left side, initial encounter for closed fracture: Secondary | ICD-10-CM | POA: Diagnosis not present

## 2019-04-22 DIAGNOSIS — I4819 Other persistent atrial fibrillation: Secondary | ICD-10-CM

## 2019-04-22 DIAGNOSIS — M549 Dorsalgia, unspecified: Secondary | ICD-10-CM

## 2019-04-22 IMAGING — DX DG RIBS W/ CHEST 3+V*L*
5 series · 5 of 5 positions shown · non-contrast
Comparison: Left rib radiographs [DATE]

CLINICAL DATA: Fall from tree. Left-sided back pain.

EXAM:
LEFT RIBS AND CHEST - 3+ VIEW

[chest pa]
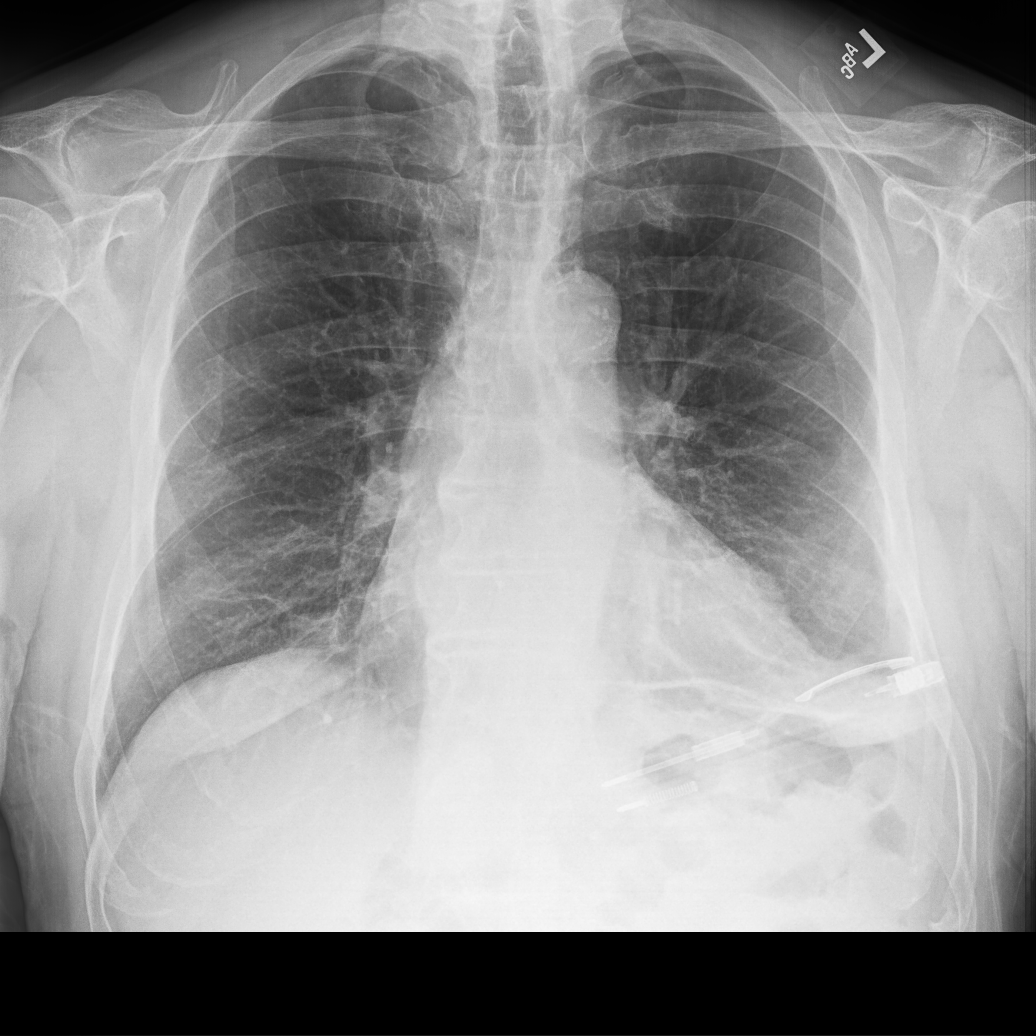

[hemithorax (ribs) ap (1 of 2)]
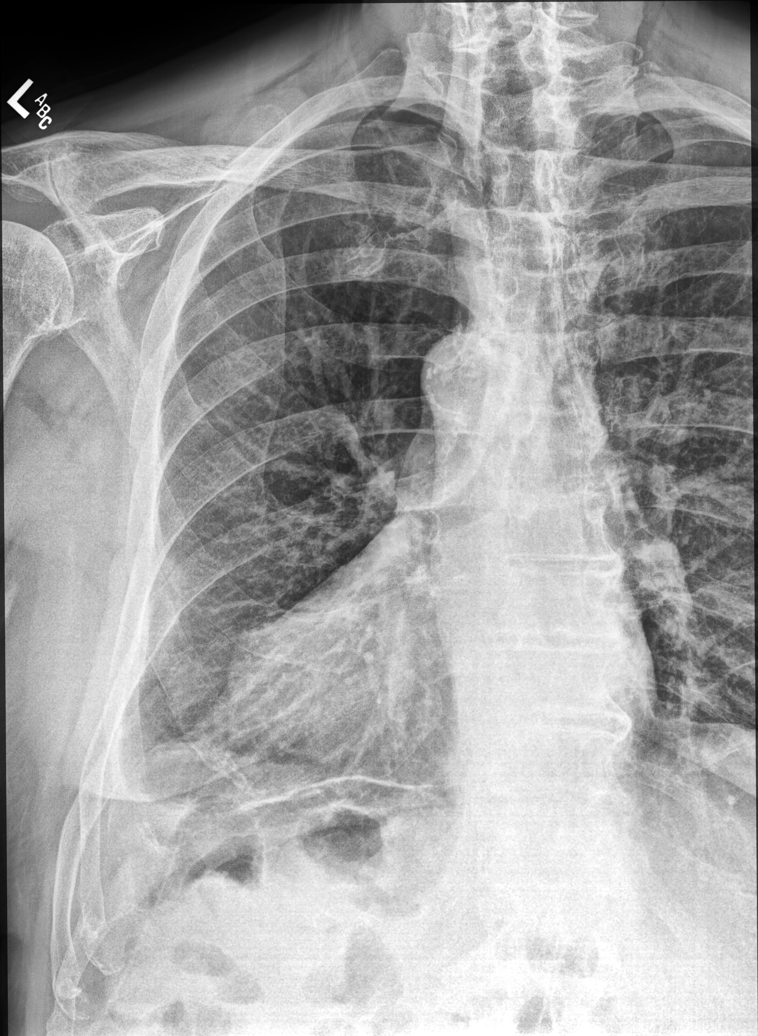

[hemithorax (ribs) ap (2 of 2)]
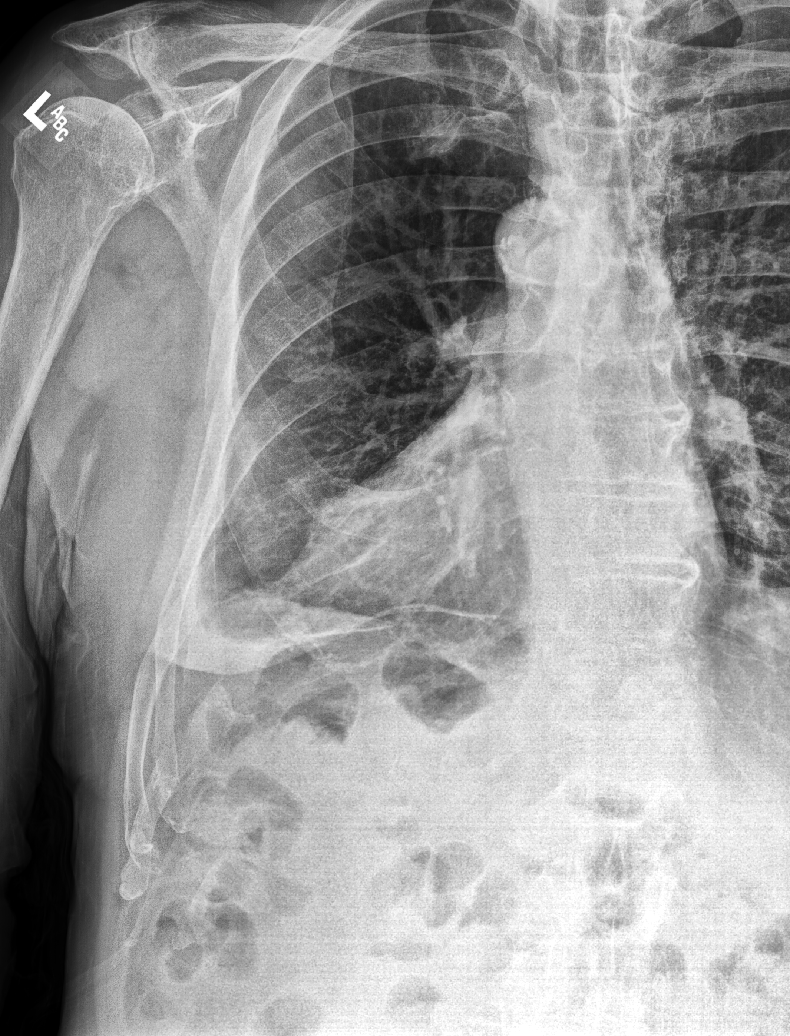

[oblique ribs oblique (1 of 2)]
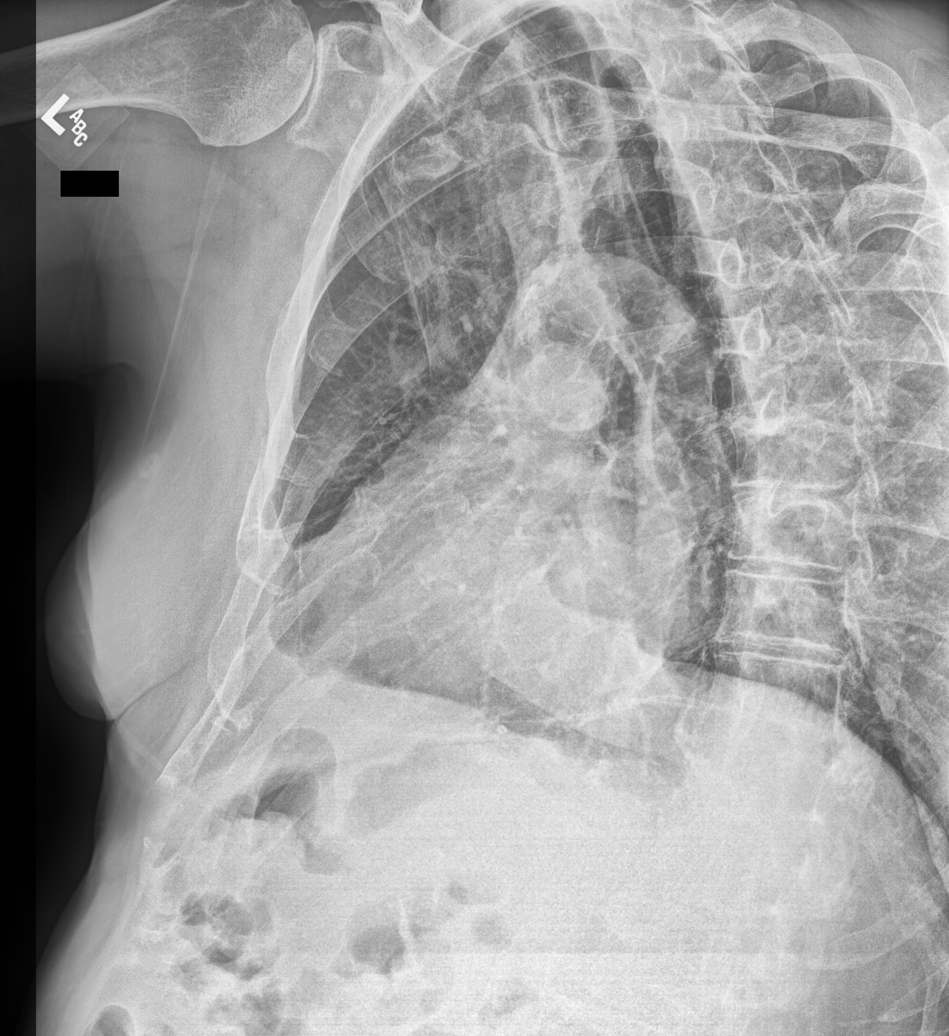

[oblique ribs oblique (2 of 2)]
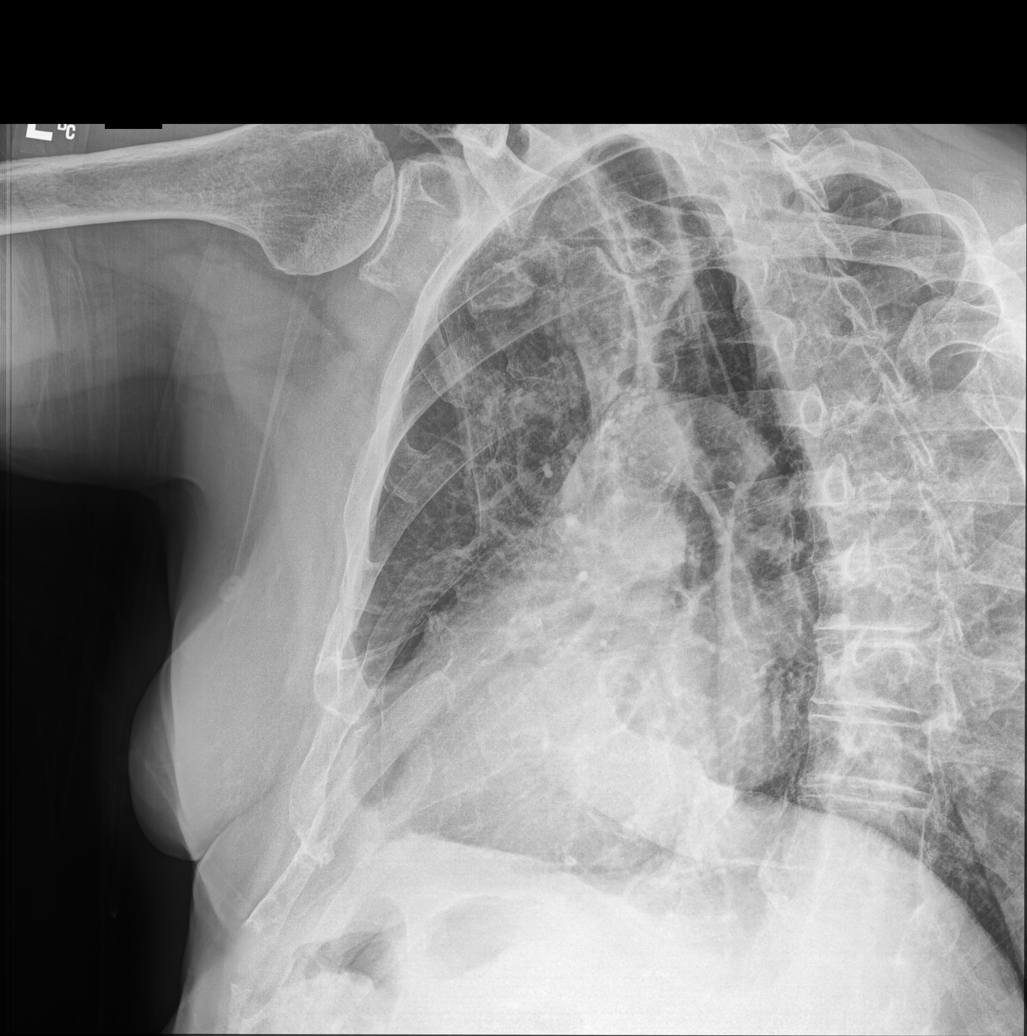

[5 of 5 positions shown; findings below may reference images not displayed]

FINDINGS: A minimally displaced left acute fifth rib fracture is present peer
a more remote left sixth rib fracture is present. There is no
pneumothorax. The heart size is normal. Atherosclerotic
calcifications are present at the aortic arch. The lungs are clear.
Chronic scarring is present at the left base.
IMPRESSION: 1. Acute left fifth rib fracture without pneumothorax.
2. More remote left sixth rib fracture.
3. Aortic atherosclerosis.

## 2019-04-22 NOTE — Progress Notes (Signed)
Phone [phone] In person visit   Subjective:   William Norman is a 79 y.o. year old very pleasant male patient who presents for/with See problem oriented charting Chief Complaint  Patient presents with  . Follow-up  . fall   This visit occurred during the SARS-CoV-2 public health emergency.  Safety protocols were in place, including screening questions prior to the visit, additional usage of staff PPE, and extensive cleaning of exam room while observing appropriate contact time as indicated for disinfecting solutions.   Past Medical History-  Patient Active Problem List   Diagnosis Date Noted  . Coagulation disorder ([place]) 10/26/2018  . Persistent atrial fibrillation ([place]) 04/10/2018  . Chronic dermatitis of hands 10/12/2015  . Soft tissue lesion of foot 08/27/2015  . Overweight (BMI 25.0-29.9) 06/18/2015  . BPH associated with nocturia 08/29/2013  . CAD (coronary artery disease), native coronary artery   . Long term current use of anticoagulant therapy   . Personal history of DVT (deep vein thrombosis)   . Chronic venous insufficiency   . Erectile dysfunction 07/11/2012  . Lumbar disc disease   . GERD 08/04/2006  . Dyslipidemia   . Essential hypertension     Medications- reviewed and updated Current Outpatient Medications  Medication Sig Dispense Refill  . amLODipine (NORVASC) 5 MG tablet Take 1 tablet (5 mg total) by mouth every other day. 90 tablet 1  . atenolol (TENORMIN) 100 MG tablet Take 1 tablet (100 mg total) by mouth daily. 90 tablet 1  . atorvastatin (LIPITOR) 40 MG tablet TAKE 1 TABLET BY MOUTH EVERY DAY 90 tablet 1  . Coenzyme Q10 (COQ10) 200 MG CAPS Take 1 capsule by mouth daily.     . hydroxypropyl methylcellulose / hypromellose (ISOPTO TEARS / GONIOVISC) 2.5 % ophthalmic solution Place 1 drop into both eyes 4 (four) times daily as needed for dry eyes.    Marland Kitchen lisinopril (ZESTRIL) 40 MG tablet TAKE 1 TABLET BY MOUTH EVERY DAY 90 tablet 0  .  nitroGLYCERIN (NITROSTAT) 0.4 MG SL tablet Dissolve 1 tablet under the tongue every 5 minutes as needed 90 tablet 1  . warfarin (COUMADIN) 5 MG tablet TAKE 1 TABLET BY MOUTH DAILY OR AS DIRECTED BY ANTICOAGULATION CLINIC. 100 tablet 1   No current facility-administered medications for this visit.     Objective:  BP 130/80   Pulse 84   Temp 98.1 F (36.7 C)   Ht 6' (1.829 m)   Wt 211 lb 12.8 oz (96.1 kg)   SpO2 96%   BMI 28.73 kg/m  Gen: NAD, resting comfortably CV: irregularly irregular No obvious chest wall tenderness Lungs: CTAB no crackles, wheeze, rhonchi Ext: no edema MSK: patient with pain over left lateral upper to mid back- has one pinpoint area where he pulls away    Assessment and Plan  Left-sided rib pain/fall S: Patient with fall 3 days ago.  Pine tree was down and was cutting off branches. He lost balance started to fall backwards. Fell onto left upper back. Did not hit head or lose consciousness. Felt jarred overall as hit hard but did not feel pain immediately. Pain mainly came on the next day- he was actually able to finish cutting. The next day pain was mild as well as following day. Also had some pain in lower portion of left chest. Doing things like blowing nose or coughing would worsen the pain. Pain was worse at night and couldn't lay on left side first 2 nights. Night before last finally  able to lay on left side with assist of pillow. Last night he threw his left leg up and he felt a little click and got a sharp pain where he had landed. He rates pain as moderate since that time- getting up from seated position makes things worse. No pain in middle of back. Tried some hemp salve chest but not on back but otherwise has not tried medicine- has not tried tylenol. Discussed avoiding nsaids with xarelto.   He also felt lower energy yesterday while working. Fully vaccinated from covid 19.  A/P: 79 year old male with left-sided rib pain/left mid to upper back pain after a  fall.  I worry about rib fracture versus contusion.  We will get left-sided rib films as well as chest x-ray to evaluate further.  From AVS "  Patient Instructions  I think you either have a rib fracture or at least a significant bone bruise- likely to take 4-6 weeks to heal all the way up.   Heating pad is reasonable to use 15 minutes 3-4 x a day  You may also want to schedule some tylenol 650mg  every 6 hours while awake for pain control. If you need something stronger please let me know - could try low dose tramadol  "  #Atrial fibrillation S: Patient is rate controlled on atenolol.  He was scheduled to see cardiology today but canceled that visit so he could come in today.  He is anticoagulated with Xarelto A/P: Appears stable and persistent atrial fibrillation-continue rate control and anticoagulation.  We discussed this topic specifically due to fall but he had no head injury or loss of consciousness-do not think neuroimaging is necessary   Recommended follow up:  Future Appointments  Date Time Provider [place]  05/22/2019  2:30 PM [facility]  07/03/2019  2:30 PM Gardiner Barefoot, DPM [facility]  07/08/2019  2:20 PM [name], [name], MD [facility]    Lab/Order associations:   ICD-10-CM   1. Upper back pain on left side  M54.9 DG Ribs Unilateral W/Chest Left  2. Persistent atrial fibrillation (HCC) Chronic I48.19    Time Spent: 20 minutes of total time (4:15 PM- 4:31 PM, 5:37 PM-5:41 PM ) was spent on the date of the encounter performing the following actions: chart review prior to seeing the patient, obtaining history, performing a medically necessary exam, counseling on the treatment plan, placing orders, and documenting in our EHR.   Return precautions advised.  Garret Reddish, MD

## 2019-04-22 NOTE — Patient Instructions (Addendum)
Please stop by x-ray before you go If you do not have mychart- we will call you about results within 5 business days of [place] receiving them.  If you have mychart- we will send your results within 3 business days of [place] receiving them.  If abnormal or we want to clarify a result, we will call or mychart you to make sure you receive the message.  If you have questions or concerns or don't hear within 5 business days, please send [place] a message or call us.    I think you either have a rib fracture or at least a significant bone bruise- likely to take 4-6 weeks to heal all the way up.   Heating pad is reasonable to use 15 minutes 3-4 x a day  You may also want to schedule some tylenol 650mg  every 6 hours while awake for pain control. If you need something stronger please let me know - could try low dose tramadol

## 2019-05-21 ENCOUNTER — Other Ambulatory Visit: Payer: Self-pay

## 2019-05-22 ENCOUNTER — Ambulatory Visit: Payer: Medicare HMO

## 2019-05-25 DIAGNOSIS — Z20828 Contact with and (suspected) exposure to other viral communicable diseases: Secondary | ICD-10-CM | POA: Diagnosis not present

## 2019-05-25 DIAGNOSIS — I10 Essential (primary) hypertension: Secondary | ICD-10-CM | POA: Diagnosis not present

## 2019-05-25 DIAGNOSIS — Z03818 Encounter for observation for suspected exposure to other biological agents ruled out: Secondary | ICD-10-CM | POA: Diagnosis not present

## 2019-05-26 NOTE — Progress Notes (Signed)
Cardiology Office Note:    Date:  05/27/2019   ID:  William Norman, DOB [birth year], MRN [id number]  PCP:  Vivi Barrack, MD  Cardiologist:  Shirlee More, MD    Referring MD: Vivi Barrack, MD    ASSESSMENT:    1. Persistent atrial fibrillation ([place])   2. Long term current use of anticoagulant therapy   3. Hypertensive heart disease without heart failure   4. Dyslipidemia   5. Coronary artery disease involving native coronary artery of native heart without angina pectoris    PLAN:    In order of problems listed above:  1. Overall he is done well he is rate controlled atrial fibrillation anticoagulated and at this point time I would not advise attempts to cardiovert her to maintain sinus rhythm.  He is on warfarin goal INR 2.5. 2. Stable blood pressure rechecked by me 138/80 continue current treatment ACE inhibitor beta-blocker 3. Stable dyslipidemia continue his high intensity statin have labs done next month with his PCP goal INR less than 100 ideally less than 70 4. Stable CAD having no angina continue medical therapy including anticoagulant calcium channel blocker beta-blocker statin   Next appointment: 6 months   Medication Adjustments/Labs and Tests Ordered: Current medicines are reviewed at length with the patient today.  Concerns regarding medicines are outlined above.  No orders of the defined types were placed in this encounter.  No orders of the defined types were placed in this encounter.   Chief Complaint  Patient presents with  . Follow-up    6 Months  . Atrial Fibrillation    History of Present Illness:    William Norman is a 79 y.o. male with a hx of CAD PCI of the LAD in 1992 right coronary artery in 1986, deep vein thrombosis on chronic anticoagulant therapy hypertension hyperlipidemia was asymptomatic but found to be in persistent atrial fibrillation.  He was last seen 10/24/2018.  Echocardiogram performed 04/18/2018 showed normal  left ventricular size and function ejection fraction 60 to 65% but also moderate to severe left atrial and right atrial enlargement.  He has 7-day cardiac monitor showed well controlled atrial fibrillation. Compliance with diet, lifestyle and medications: Yes  In general he is done well he just notices a little bit of a decrease in his endurance but he attributes to atrial fibrillation.  He is anticoagulated without bleeding complication.  He has had both Covid vaccines had a recent contact square but did not develop the illness he was tested.  He has had no angina edema shortness of breath palpitation or syncope and tolerates his statin without muscle pain or weakness.  He is having a wellness exam this month and he will need to have a CMP and lipid profile performed. Past Medical History:  Diagnosis Date  . BPH (benign prostatic hypertrophy)   . BPH associated with nocturia 08/29/2013  . CAD (coronary artery disease)   . CAD (coronary artery disease), native coronary artery    PTCA of RCA 1986, PTCA of LAD 1992,  Negative treadmill Cardiolite 2011   . Chronic dermatitis of hands 10/12/2015  . Chronic venous insufficiency   . DVT (deep venous thrombosis) ([place])   . Dyslipidemia   . Eczema   . Erectile dysfunction 07/11/2012  . Essential hypertension   . GERD 08/04/2006      . GERD (gastroesophageal reflux disease)   . Hyperlipidemia   . Hypertension   . Long term current use of anticoagulant therapy   .  Lumbar disc disease   . Overweight (BMI 25.0-29.9) 06/18/2015  . Personal history of DVT (deep vein thrombosis)    Initially in 1998 with recurrence in 2002 now on chronic warfarin   . Soft tissue lesion of foot 08/27/2015    Past Surgical History:  Procedure Laterality Date  . CARDIAC CATHETERIZATION    . CARPAL TUNNEL RELEASE  04/21/2011   Procedure: CARPAL TUNNEL RELEASE;  Surgeon: Tennis Must, MD;  Location: [facility];  Service: Orthopedics;  Laterality: Left;  .  CARPAL TUNNEL RELEASE  05/23/2011   Procedure: CARPAL TUNNEL RELEASE;  Surgeon: Tennis Must, MD;  Location: [facility];  Service: Orthopedics;  Laterality: Right;  . COLON SURGERY  2010 and 2011 colostomy reversal  . CORONARY ANGIOPLASTY    . EYE SURGERY     repair of macular hole  . NASAL SEPTUM SURGERY    . precutaneous transluminal coronary angioplasty    . right hernia    . TONSILLECTOMY AND ADENOIDECTOMY      Current Medications: Current Meds  Medication Sig  . amLODipine (NORVASC) 5 MG tablet Take 1 tablet (5 mg total) by mouth every other day.  Marland Kitchen atenolol (TENORMIN) 100 MG tablet Take 1 tablet (100 mg total) by mouth daily.  Marland Kitchen atorvastatin (LIPITOR) 40 MG tablet TAKE 1 TABLET BY MOUTH EVERY DAY  . Coenzyme Q10 (COQ10) 200 MG CAPS Take 1 capsule by mouth daily.   . hydroxypropyl methylcellulose / hypromellose (ISOPTO TEARS / GONIOVISC) 2.5 % ophthalmic solution Place 1 drop into both eyes 4 (four) times daily as needed for dry eyes.  Marland Kitchen lisinopril (ZESTRIL) 40 MG tablet TAKE 1 TABLET BY MOUTH EVERY DAY  . nitroGLYCERIN (NITROSTAT) 0.4 MG SL tablet Dissolve 1 tablet under the tongue every 5 minutes as needed  . warfarin (COUMADIN) 5 MG tablet TAKE 1 TABLET BY MOUTH DAILY OR AS DIRECTED BY ANTICOAGULATION CLINIC.     Allergies:   Other and Simvastatin   Social History   Socioeconomic History  . Marital status: Married    Spouse name: Not on file  . Number of children: 3  . Years of education: 31  . Highest education level: Not on file  Occupational History  . Occupation: Retired  Tobacco Use  . Smoking status: Former Smoker    Packs/day: 1.00    Years: 60.00    Pack years: 60.00    Types: Cigarettes    Quit date: 03/25/1998    Years since quitting: 21.1  . Smokeless tobacco: Never Used  Substance and Sexual Activity  . Alcohol use: Yes    Alcohol/week: 7.0 standard drinks    Types: 7 Glasses of wine per week    Comment: 7 glasses of red wine per week    . Drug use: No  . Sexual activity: Not on file  Other Topics Concern  . Not on file  Social History Narrative   Fun: Working outside in yard, cutting wood    Denies abuse and feels safe at home.    Social Determinants of Health   Financial Resource Strain:   . Difficulty of Paying Living Expenses:   Food Insecurity:   . Worried About Charity fundraiser in the Last Year:   . Arboriculturist in the Last Year:   Transportation Needs:   . Film/video editor (Medical):   Marland Kitchen Lack of Transportation (Non-Medical):   Physical Activity:   . Days of Exercise per Week:   .  Minutes of Exercise per Session:   Stress:   . Feeling of Stress :   Social Connections:   . Frequency of Communication with Friends and Family:   . Frequency of Social Gatherings with Friends and Family:   . Attends Religious Services:   . Active Member of Clubs or Organizations:   . Attends Archivist Meetings:   Marland Kitchen Marital Status:      Family History: The patient's family history includes AAA (abdominal aortic aneurysm) in his brother; Heart attack in his father; Heart disease in his father and mother. ROS:   Please see the history of present illness.    All other systems reviewed and are negative.  EKGs/Labs/Other Studies Reviewed:    The following studies were reviewed today Recent Labs: 07/05/2018: Hemoglobin 15.9; Platelets 208.0; TSH 2.78 10/24/2018: ALT 17; BUN 19; Creatinine, Ser 0.88; Potassium 4.4; Sodium 139  Recent Lipid Panel    Component Value Date/Time   CHOL 132 10/24/2018 1607   TRIG 103 10/24/2018 1607   TRIG 68 01/10/2006 1108   HDL 44 10/24/2018 1607   CHOLHDL 3.0 10/24/2018 1607   CHOLHDL 3 07/05/2018 1414   VLDL 19.2 07/05/2018 1414   LDLCALC 69 10/24/2018 1607   LDLDIRECT 131.5 04/22/2010 1018    Physical Exam:    VS:  BP (!) 150/88   Pulse 68   Ht 6' (1.829 m)   Wt 213 lb (96.6 kg)   SpO2 97%   BMI 28.89 kg/m     Wt Readings from Last 3 Encounters:    05/27/19 213 lb (96.6 kg)  04/22/19 211 lb 12.8 oz (96.1 kg)  04/12/19 214 lb 6.4 oz (97.3 kg)     GEN:  Well nourished, well developed in no acute distress HEENT: Normal NECK: No JVD; No carotid bruits LYMPHATICS: No lymphadenopathy CARDIAC: Irregular S1 variable no murmurs, rubs, gallops RESPIRATORY:  Clear to auscultation without rales, wheezing or rhonchi  ABDOMEN: Soft, non-tender, non-distended MUSCULOSKELETAL:  No edema; No deformity  SKIN: Warm and dry NEUROLOGIC:  Alert and oriented x 3 PSYCHIATRIC:  Normal affect    Signed, Shirlee More, MD  05/27/2019 2:37 PM    [place]

## 2019-05-27 ENCOUNTER — Other Ambulatory Visit: Payer: Self-pay

## 2019-05-27 ENCOUNTER — Encounter: Payer: Self-pay | Admitting: Cardiology

## 2019-05-27 ENCOUNTER — Ambulatory Visit: Payer: Medicare HMO | Admitting: Cardiology

## 2019-05-27 VITALS — BP 150/88 | HR 68 | Ht 72.0 in | Wt 213.0 lb

## 2019-05-27 DIAGNOSIS — I119 Hypertensive heart disease without heart failure: Secondary | ICD-10-CM

## 2019-05-27 DIAGNOSIS — I4819 Other persistent atrial fibrillation: Secondary | ICD-10-CM

## 2019-05-27 DIAGNOSIS — E785 Hyperlipidemia, unspecified: Secondary | ICD-10-CM

## 2019-05-27 DIAGNOSIS — I2510 Atherosclerotic heart disease of native coronary artery without angina pectoris: Secondary | ICD-10-CM | POA: Diagnosis not present

## 2019-05-27 DIAGNOSIS — Z7901 Long term (current) use of anticoagulants: Secondary | ICD-10-CM

## 2019-05-27 NOTE — Patient Instructions (Signed)

## 2019-05-28 ENCOUNTER — Telehealth: Payer: Self-pay | Admitting: Family Medicine

## 2019-05-28 NOTE — Telephone Encounter (Signed)
Monica from [facility] is calling she talked to the patient and he told her that he atorvastatin (LIPITOR) 40 MG tablet that he was only taking half of the pill instead of taking the whole thing. Monica just wanted to call and touch base and if there is any questions to call her back at [phone].

## 2019-05-28 NOTE — Telephone Encounter (Signed)
He should be on whole tablet.   [name]. [name], MD 05/28/2019 11:30 AM

## 2019-05-28 NOTE — Telephone Encounter (Signed)
Left detailed message ok for patient to wait until May,Informed patient that he should be taking atorvastatin 1 tablet daily.

## 2019-05-28 NOTE — Telephone Encounter (Signed)
Patient calling back in regard.  States has a CPE on 5/24 and would like to know if he could wait until that appointment.  Patient can be reached at [phone].

## 2019-05-28 NOTE — Telephone Encounter (Signed)
Patient taking  1/2 tablet daily atorvastatin 40 MG, Left patient a detailed message to call the clinic to schedule a follow up.Please advise

## 2019-06-14 ENCOUNTER — Other Ambulatory Visit: Payer: Self-pay

## 2019-06-17 ENCOUNTER — Ambulatory Visit (INDEPENDENT_AMBULATORY_CARE_PROVIDER_SITE_OTHER): Payer: Medicare HMO | Admitting: General Practice

## 2019-06-17 DIAGNOSIS — Z7901 Long term (current) use of anticoagulants: Secondary | ICD-10-CM

## 2019-06-17 LAB — POCT INR: INR: 3.7 — AB (ref 2.0–3.0)

## 2019-06-17 NOTE — Patient Instructions (Signed)
Pre visit review using our clinic review tool, if applicable. No additional management support is needed unless otherwise documented below in the visit note.  Hold coumadin today and then change dosage and take 1 tablet daily except take 1/2 tablet on Sundays.  Re-check in 3 weeks.

## 2019-06-25 ENCOUNTER — Other Ambulatory Visit: Payer: Self-pay | Admitting: Family Medicine

## 2019-06-25 DIAGNOSIS — Z7901 Long term (current) use of anticoagulants: Secondary | ICD-10-CM

## 2019-07-03 ENCOUNTER — Ambulatory Visit: Payer: Medicare HMO | Admitting: Podiatry

## 2019-07-03 ENCOUNTER — Encounter: Payer: Self-pay | Admitting: Podiatry

## 2019-07-03 ENCOUNTER — Other Ambulatory Visit: Payer: Self-pay

## 2019-07-03 VITALS — Temp 97.5°F

## 2019-07-03 DIAGNOSIS — I872 Venous insufficiency (chronic) (peripheral): Secondary | ICD-10-CM

## 2019-07-03 DIAGNOSIS — M79676 Pain in unspecified toe(s): Secondary | ICD-10-CM | POA: Diagnosis not present

## 2019-07-03 DIAGNOSIS — D689 Coagulation defect, unspecified: Secondary | ICD-10-CM | POA: Diagnosis not present

## 2019-07-03 DIAGNOSIS — B351 Tinea unguium: Secondary | ICD-10-CM | POA: Diagnosis not present

## 2019-07-03 NOTE — Progress Notes (Signed)
This patient returns to my office for at risk foot care.  This patient requires this care by a professional since this patient will be at risk due to having venous insufficiency and coagulation defect.  Patient is taking coumadin.  This patient is unable to cut nails himself since the patient cannot reach his nails.These nails are painful walking and wearing shoes.  This patient presents for at risk foot care today.  General Appearance  Alert, conversant and in no acute stress.  Vascular  Dorsalis pedis and posterior tibial  pulses are palpable  bilaterally.  Capillary return is within normal limits  bilaterally. Temperature is within normal limits  bilaterally.  Neurologic  Senn-Weinstein monofilament wire test within normal limits  bilaterally. Muscle power within normal limits bilaterally.  Nails Thick disfigured discolored nails with subungual debris  from hallux to fifth toes bilaterally. No evidence of bacterial infection or drainage bilaterally.  Orthopedic  No limitations of motion  feet .  No crepitus or effusions noted.  No bony pathology or digital deformities noted.  Skin  normotropic skin with no porokeratosis noted bilaterally.  No signs of infections or ulcers noted.     Onychomycosis  Pain in right toes  Pain in left toes  Consent was obtained for treatment procedures.   Mechanical debridement of nails 1-5  bilaterally performed with a nail nipper.  Filed with dremel without incident.    Return office visit   10 weeks                  Told patient to return for periodic foot care and evaluation due to potential at risk complications.   Lajuana Patchell DPM  

## 2019-07-08 ENCOUNTER — Ambulatory Visit (INDEPENDENT_AMBULATORY_CARE_PROVIDER_SITE_OTHER): Payer: Medicare HMO | Admitting: Family Medicine

## 2019-07-08 ENCOUNTER — Encounter: Payer: Self-pay | Admitting: Family Medicine

## 2019-07-08 ENCOUNTER — Ambulatory Visit (INDEPENDENT_AMBULATORY_CARE_PROVIDER_SITE_OTHER): Payer: Medicare HMO | Admitting: General Practice

## 2019-07-08 ENCOUNTER — Other Ambulatory Visit: Payer: Self-pay

## 2019-07-08 VITALS — BP 140/80 | HR 77 | Temp 97.3°F | Ht 72.0 in | Wt 215.4 lb

## 2019-07-08 DIAGNOSIS — R351 Nocturia: Secondary | ICD-10-CM

## 2019-07-08 DIAGNOSIS — I25118 Atherosclerotic heart disease of native coronary artery with other forms of angina pectoris: Secondary | ICD-10-CM | POA: Diagnosis not present

## 2019-07-08 DIAGNOSIS — E663 Overweight: Secondary | ICD-10-CM

## 2019-07-08 DIAGNOSIS — Z0001 Encounter for general adult medical examination with abnormal findings: Secondary | ICD-10-CM

## 2019-07-08 DIAGNOSIS — E785 Hyperlipidemia, unspecified: Secondary | ICD-10-CM | POA: Diagnosis not present

## 2019-07-08 DIAGNOSIS — R739 Hyperglycemia, unspecified: Secondary | ICD-10-CM | POA: Insufficient documentation

## 2019-07-08 DIAGNOSIS — Z6829 Body mass index (BMI) 29.0-29.9, adult: Secondary | ICD-10-CM

## 2019-07-08 DIAGNOSIS — I10 Essential (primary) hypertension: Secondary | ICD-10-CM

## 2019-07-08 DIAGNOSIS — Z7901 Long term (current) use of anticoagulants: Secondary | ICD-10-CM

## 2019-07-08 DIAGNOSIS — N401 Enlarged prostate with lower urinary tract symptoms: Secondary | ICD-10-CM

## 2019-07-08 HISTORY — DX: Hyperglycemia, unspecified: R73.9

## 2019-07-08 LAB — TSH: TSH: 2.61 u[IU]/mL (ref 0.35–4.50)

## 2019-07-08 LAB — CBC
HCT: 44.3 % (ref 39.0–52.0)
Hemoglobin: 14.7 g/dL (ref 13.0–17.0)
MCHC: 33.2 g/dL (ref 30.0–36.0)
MCV: 88.7 fl (ref 78.0–100.0)
Platelets: 191 10*3/uL (ref 150.0–400.0)
RBC: 5 Mil/uL (ref 4.22–5.81)
RDW: 15.1 % (ref 11.5–15.5)
WBC: 6 10*3/uL (ref 4.0–10.5)

## 2019-07-08 LAB — LIPID PANEL
Cholesterol: 130 mg/dL (ref 0–200)
HDL: 46.1 mg/dL (ref >39.00)
LDL Cholesterol: 71 mg/dL (ref 0–99)
NonHDL: 84.13
Total CHOL/HDL Ratio: 3
Triglycerides: 67 mg/dL (ref 0.0–149.0)
VLDL: 13.4 mg/dL (ref 0.0–40.0)

## 2019-07-08 LAB — COMPREHENSIVE METABOLIC PANEL
ALT: 20 U/L (ref 0–53)
AST: 24 U/L (ref 0–37)
Albumin: 4 g/dL (ref 3.5–5.2)
Alkaline Phosphatase: 64 U/L (ref 39–117)
BUN: 15 mg/dL (ref 6–23)
CO2: 25 mEq/L (ref 19–32)
Calcium: 9.4 mg/dL (ref 8.4–10.5)
Chloride: 107 mEq/L (ref 96–112)
Creatinine, Ser: 0.78 mg/dL (ref 0.40–1.50)
GFR: 95.88 mL/min (ref >60.00)
Glucose, Bld: 87 mg/dL (ref 70–99)
Potassium: 4.3 mEq/L (ref 3.5–5.1)
Sodium: 140 mEq/L (ref 135–145)
Total Bilirubin: 1.2 mg/dL (ref 0.2–1.2)
Total Protein: 6.2 g/dL (ref 6.0–8.3)

## 2019-07-08 LAB — POCT INR: INR: 2.7 (ref 2.0–3.0)

## 2019-07-08 LAB — HEMOGLOBIN A1C: Hgb A1c MFr Bld: 5.8 % (ref 4.6–6.5)

## 2019-07-08 MED ORDER — ATORVASTATIN CALCIUM 40 MG PO TABS: 20.0000 mg | ORAL_TABLET | Freq: Every day | ORAL | 1 refills | Status: DC

## 2019-07-08 NOTE — Assessment & Plan Note (Signed)
He will be following up with urology.

## 2019-07-08 NOTE — Assessment & Plan Note (Signed)
Continue management per cardiology. 

## 2019-07-08 NOTE — Assessment & Plan Note (Signed)
At goal. Continue amlodipine 5mg  daily, atenolol 100 mg daily, and lisinopril 40 mg daily. Check CBC, CMET, and TSH.

## 2019-07-08 NOTE — Assessment & Plan Note (Signed)
Stable.  Continue Lipitor 10 mg daily.  Will check lipid panel.

## 2019-07-08 NOTE — Patient Instructions (Addendum)
Pre visit review using our clinic review tool, if applicable. No additional management support is needed unless otherwise documented below in the visit note.  Continue to take 1 tablet daily except take 1/2 tablet on Sundays.  Re-check in 5 weeks.

## 2019-07-08 NOTE — Patient Instructions (Signed)
It was very nice to see you today!  We will check blood work today.  No medication changes today.  Please continue working on diet and exercise.  I will see you back in year for your next physical, or sooner if needed.  Take care, Dr [name]  Please try these tips to maintain a healthy lifestyle:   Eat at least 3 REAL meals and 1-2 snacks per day.  Aim for no more than 5 hours between eating.  If you eat breakfast, please do so within one hour of getting up.    Each meal should contain half fruits/vegetables, one quarter protein, and one quarter carbs (no bigger than a computer mouse)   Cut down on sweet beverages. This includes juice, soda, and sweet tea.     Drink at least 1 glass of water with each meal and aim for at least 8 glasses per day   Exercise at least 150 minutes every week.    Preventive Care 80 Years and Older, Male Preventive care refers to lifestyle choices and visits with your health care provider that can promote health and wellness. This includes:  A yearly physical exam. This is also called an annual well check.  Regular dental and eye exams.  Immunizations.  Screening for certain conditions.  Healthy lifestyle choices, such as diet and exercise. What can I expect for my preventive care visit? Physical exam Your health care provider will check:  Height and weight. These may be used to calculate body mass index (BMI), which is a measurement that tells if you are at a healthy weight.  Heart rate and blood pressure.  Your skin for abnormal spots. Counseling Your health care provider may ask you questions about:  Alcohol, tobacco, and drug use.  Emotional well-being.  Home and relationship well-being.  Sexual activity.  Eating habits.  History of falls.  Memory and ability to understand (cognition).  Work and work Statistician. What immunizations do I need?  Influenza (flu) vaccine  This is recommended every year. Tetanus,  diphtheria, and pertussis (Tdap) vaccine  You may need a Td booster every 10 years. Varicella (chickenpox) vaccine  You may need this vaccine if you have not already been vaccinated. Zoster (shingles) vaccine  You may need this after age 80. Pneumococcal conjugate (PCV13) vaccine  One dose is recommended after age 80. Pneumococcal polysaccharide (PPSV23) vaccine  One dose is recommended after age 37. Measles, mumps, and rubella (MMR) vaccine  You may need at least one dose of MMR if you were born in 1957 or later. You may also need a second dose. Meningococcal conjugate (MenACWY) vaccine  You may need this if you have certain conditions. Hepatitis A vaccine  You may need this if you have certain conditions or if you travel or work in places where you may be exposed to hepatitis A. Hepatitis B vaccine  You may need this if you have certain conditions or if you travel or work in places where you may be exposed to hepatitis B. Haemophilus influenzae type b (Hib) vaccine  You may need this if you have certain conditions. You may receive vaccines as individual doses or as more than one vaccine together in one shot (combination vaccines). Talk with your health care provider about the risks and benefits of combination vaccines. What tests do I need? Blood tests  Lipid and cholesterol levels. These may be checked every 5 years, or more frequently depending on your overall health.  Hepatitis C test.  Hepatitis  B test. Screening  Lung cancer screening. You may have this screening every year starting at age 80 if you have a 30-pack-year history of smoking and currently smoke or have quit within the past 15 years.  Colorectal cancer screening. All adults should have this screening starting at age 80 and continuing until age 72. Your health care provider may recommend screening at age 72 if you are at increased risk. You will have tests every 1-10 years, depending on your results and  the type of screening test.  Prostate cancer screening. Recommendations will vary depending on your family history and other risks.  Diabetes screening. This is done by checking your blood sugar (glucose) after you have not eaten for a while (fasting). You may have this done every 1-3 years.  Abdominal aortic aneurysm (AAA) screening. You may need this if you are a current or former smoker.  Sexually transmitted disease (STD) testing. Follow these instructions at home: Eating and drinking  Eat a diet that includes fresh fruits and vegetables, whole grains, lean protein, and low-fat dairy products. Limit your intake of foods with high amounts of sugar, saturated fats, and salt.  Take vitamin and mineral supplements as recommended by your health care provider.  Do not drink alcohol if your health care provider tells you not to drink.  If you drink alcohol: ? Limit how much you have to 0-2 drinks a day. ? Be aware of how much alcohol is in your drink. In the [place] one drink equals one 12 oz bottle of beer (355 mL), one 5 oz glass of wine (148 mL), or one 1 oz glass of hard liquor (44 mL). Lifestyle  Take daily care of your teeth and gums.  Stay active. Exercise for at least 30 minutes on 5 or more days each week.  Do not use any products that contain nicotine or tobacco, such as cigarettes, e-cigarettes, and chewing tobacco. If you need help quitting, ask your health care provider.  If you are sexually active, practice safe sex. Use a condom or other form of protection to prevent STIs (sexually transmitted infections).  Talk with your health care provider about taking a low-dose aspirin or statin. What's next?  Visit your health care provider once a year for a well check visit.  Ask your health care provider how often you should have your eyes and teeth checked.  Stay up to date on all vaccines. This information is not intended to replace advice given to you by your health care  provider. Make sure you discuss any questions you have with your health care provider. Document Revised: 01/25/2018 Document Reviewed: 01/25/2018 Elsevier Patient Education  2020 [employer].

## 2019-07-08 NOTE — Progress Notes (Signed)
Chief Complaint:  William Norman is a 79 y.o. male who presents today for his annual comprehensive physical exam.    Assessment/Plan:  Chronic Problems Addressed Today: Essential hypertension At goal. Continue amlodipine 20m daily, atenolol 100 mg daily, and lisinopril 40 mg daily. Check CBC, CMET, and TSH.   Dyslipidemia Stable.  Continue Lipitor 10 mg daily.  Will check lipid panel.  BPH associated with nocturia He will be following up with urology.  CAD (coronary artery disease), native coronary artery Continue management per cardiology.  Hyperglycemia Check A1c.   Body mass index is 29.21 kg/m. / Overweight BMI Metric Follow Up - 07/08/19 1441      BMI Metric Follow Up-Please document annually   BMI Metric Follow Up  Education provided       Preventative Healthcare: Check CBC, C met, TSH, lipid panel. UTD on other vaccines and screenings.   Patient Counseling(The following topics were reviewed and/or handout was given):  -Nutrition: Stressed importance of moderation in sodium/caffeine intake, saturated fat and cholesterol, caloric balance, sufficient intake of fresh fruits, vegetables, and fiber.  -Stressed the importance of regular exercise.   -Substance Abuse: Discussed cessation/primary prevention of tobacco, alcohol, or other drug use; driving or other dangerous activities under the influence; availability of treatment for abuse.   -Injury prevention: Discussed safety belts, safety helmets, smoke detector, smoking near bedding or upholstery.   -Sexuality: Discussed sexually transmitted diseases, partner selection, use of condoms, avoidance of unintended pregnancy and contraceptive alternatives.   -Dental health: Discussed importance of regular tooth brushing, flossing, and dental visits.  -Health maintenance and immunizations reviewed. Please refer to Health maintenance section.  Return to care in 1 year for next preventative visit.     Subjective:   HPI:  He has no acute complaints today.   His stable, chronic medical conditions are outlined below:  # Essential Hypertension - On norvasc 569mdaily, lisinopril 40 mg daily and atenolol 10015maily.  Tolerating well without side effects. - ROS: No reported chest pain or shortness of breath.   # Dyslipidemia - On lipitor 7m57mily and tolerating well - ROS: No reported myalgias  # Afib anticoagulated on warfarin - Follows with cardiology  # BPH - Following with urology   Lifestyle Diet: Balanced. Plenty of fruits and vegetables.  Exercise: Likes to work outside.   Depression screen PHQ 2/9 04/12/2019  Decreased Interest 0  Down, Depressed, Hopeless 0  PHQ - 2 Score 0    There are no preventive care reminders to display for this patient.   ROS: Per HPI, otherwise a complete review of systems was negative.   PMH:  The following were reviewed and entered/updated in epic: Past Medical History:  Diagnosis Date  . BPH (benign prostatic hypertrophy)   . BPH associated with nocturia 08/29/2013  . CAD (coronary artery disease)   . CAD (coronary artery disease), native coronary artery    PTCA of RCA 1986, PTCA of LAD 1992,  Negative treadmill Cardiolite 2011   . Chronic dermatitis of hands 10/12/2015  . Chronic venous insufficiency   . DVT (deep venous thrombosis) (HCC)[place]. Dyslipidemia   . Eczema   . Erectile dysfunction 07/11/2012  . Essential hypertension   . GERD 08/04/2006      . GERD (gastroesophageal reflux disease)   . Hyperlipidemia   . Hypertension   . Long term current use of anticoagulant therapy   . Lumbar disc disease   . Overweight (BMI 25.0-29.9) 06/18/2015  .  Personal history of DVT (deep vein thrombosis)    Initially in 1998 with recurrence in 2002 now on chronic warfarin   . Soft tissue lesion of foot 08/27/2015   Patient Active Problem List   Diagnosis Date Noted  . Hyperglycemia 07/08/2019  . Coagulation disorder ([place]) 10/26/2018  .  Persistent atrial fibrillation ([place]) 04/10/2018  . Chronic dermatitis of hands 10/12/2015  . Soft tissue lesion of foot 08/27/2015  . BPH associated with nocturia 08/29/2013  . CAD (coronary artery disease), native coronary artery   . Long term current use of anticoagulant therapy   . Personal history of DVT (deep vein thrombosis)   . Chronic venous insufficiency   . Erectile dysfunction 07/11/2012  . Lumbar disc disease   . GERD 08/04/2006  . Dyslipidemia   . Essential hypertension    Past Surgical History:  Procedure Laterality Date  . CARDIAC CATHETERIZATION    . CARPAL TUNNEL RELEASE  04/21/2011   Procedure: CARPAL TUNNEL RELEASE;  Surgeon: Tennis Must, MD;  Location: [facility];  Service: Orthopedics;  Laterality: Left;  . CARPAL TUNNEL RELEASE  05/23/2011   Procedure: CARPAL TUNNEL RELEASE;  Surgeon: Tennis Must, MD;  Location: [facility];  Service: Orthopedics;  Laterality: Right;  . COLON SURGERY  2010 and 2011 colostomy reversal  . CORONARY ANGIOPLASTY    . EYE SURGERY     repair of macular hole  . NASAL SEPTUM SURGERY    . precutaneous transluminal coronary angioplasty    . right hernia    . TONSILLECTOMY AND ADENOIDECTOMY      Family History  Problem Relation Age of Onset  . Heart disease Father   . Heart attack Father   . Heart disease Mother   . AAA (abdominal aortic aneurysm) Brother     Medications- reviewed and updated Current Outpatient Medications  Medication Sig Dispense Refill  . amLODipine (NORVASC) 5 MG tablet Take 1 tablet (5 mg total) by mouth every other day. 90 tablet 1  . atenolol (TENORMIN) 100 MG tablet Take 1 tablet (100 mg total) by mouth daily. 90 tablet 1  . atorvastatin (LIPITOR) 40 MG tablet Take 0.5 tablets (20 mg total) by mouth daily. 90 tablet 1  . Coenzyme Q10 (COQ10) 200 MG CAPS Take 1 capsule by mouth daily.     . hydroxypropyl methylcellulose / hypromellose (ISOPTO TEARS / GONIOVISC) 2.5 %  ophthalmic solution Place 1 drop into both eyes 4 (four) times daily as needed for dry eyes.    Marland Kitchen lisinopril (ZESTRIL) 40 MG tablet TAKE 1 TABLET BY MOUTH EVERY DAY 90 tablet 0  . nitroGLYCERIN (NITROSTAT) 0.4 MG SL tablet Dissolve 1 tablet under the tongue every 5 minutes as needed 90 tablet 1  . warfarin (COUMADIN) 5 MG tablet TAKE 1 TABLET BY MOUTH DAILY OR AS DIRECTED BY ANTICOAGULATION CLINIC. 90 tablet 2   No current facility-administered medications for this visit.    Allergies-reviewed and updated Allergies  Allergen Reactions  . Other Other (See Comments)    Some form of numbing medication used by dentist, almost died  . Simvastatin Other (See Comments)    Muscle aches     Social History   Socioeconomic History  . Marital status: Married    Spouse name: Not on file  . Number of children: 3  . Years of education: 3  . Highest education level: Not on file  Occupational History  . Occupation: Retired  Tobacco Use  . Smoking  status: Former Smoker    Packs/day: 1.00    Years: 60.00    Pack years: 60.00    Types: Cigarettes    Quit date: 03/25/1998    Years since quitting: 21.3  . Smokeless tobacco: Never Used  Substance and Sexual Activity  . Alcohol use: Yes    Alcohol/week: 7.0 standard drinks    Types: 7 Glasses of wine per week    Comment: 7 glasses of red wine per week   . Drug use: No  . Sexual activity: Not on file  Other Topics Concern  . Not on file  Social History Narrative   Fun: Working outside in yard, cutting wood    Denies abuse and feels safe at home.    Social Determinants of Health   Financial Resource Strain:   . Difficulty of Paying Living Expenses:   Food Insecurity:   . Worried About Charity fundraiser in the Last Year:   . Arboriculturist in the Last Year:   Transportation Needs:   . Film/video editor (Medical):   Marland Kitchen Lack of Transportation (Non-Medical):   Physical Activity:   . Days of Exercise per Week:   . Minutes of  Exercise per Session:   Stress:   . Feeling of Stress :   Social Connections:   . Frequency of Communication with Friends and Family:   . Frequency of Social Gatherings with Friends and Family:   . Attends Religious Services:   . Active Member of Clubs or Organizations:   . Attends Archivist Meetings:   Marland Kitchen Marital Status:         Objective:  Physical Exam: BP 140/80 (BP Location: Left Arm, Patient Position: Sitting, Cuff Size: Normal)   Pulse 77   Temp (!) 97.3 F (36.3 C) (Temporal)   Ht 6' (1.829 m)   Wt 215 lb 6.4 oz (97.7 kg)   SpO2 98%   BMI 29.21 kg/m   Body mass index is 29.21 kg/m. Wt Readings from Last 3 Encounters:  07/08/19 215 lb 6.4 oz (97.7 kg)  05/27/19 213 lb (96.6 kg)  04/22/19 211 lb 12.8 oz (96.1 kg)   Gen: NAD, resting comfortably HEENT: TMs normal bilaterally. OP clear. No thyromegaly noted.  CV: RRR with no murmurs appreciated Pulm: NWOB, CTAB with no crackles, wheezes, or rhonchi GI: Normal bowel sounds present. Soft, Nontender, Nondistended. MSK: no edema, cyanosis, or clubbing noted Skin: warm, dry Neuro: CN2-12 grossly intact. Strength 5/5 in upper and lower extremities. Reflexes symmetric and intact bilaterally.  Psych: Normal affect and thought content     Deneise Getty M. [name], MD 07/08/2019 2:46 PM

## 2019-07-08 NOTE — Assessment & Plan Note (Signed)
Check A1c. 

## 2019-07-09 ENCOUNTER — Telehealth: Payer: Self-pay

## 2019-07-09 NOTE — Progress Notes (Signed)
Please inform patient of the following:  Blood work is all STABLE. Do not need to make any changes to his treatment plan at this time. Would like for him to keep up the good work and we can recheck in a year or so.  William Norman. [name], MD 07/09/2019 9:24 AM

## 2019-07-09 NOTE — Telephone Encounter (Signed)
Returned patients call.

## 2019-07-09 NOTE — Telephone Encounter (Signed)
Patient returning missed call. 

## 2019-08-12 ENCOUNTER — Other Ambulatory Visit: Payer: Self-pay

## 2019-08-12 ENCOUNTER — Ambulatory Visit (INDEPENDENT_AMBULATORY_CARE_PROVIDER_SITE_OTHER): Payer: Medicare HMO | Admitting: General Practice

## 2019-08-12 DIAGNOSIS — Z7901 Long term (current) use of anticoagulants: Secondary | ICD-10-CM

## 2019-08-12 LAB — POCT INR: INR: 3.6 — AB (ref 2.0–3.0)

## 2019-08-12 NOTE — Patient Instructions (Addendum)
Pre visit review using our clinic review tool, if applicable. No additional management support is needed unless otherwise documented below in the visit note.  Skip coumadin tomorrow and then take 1 tablet daily except take 1/2 tablet on Sundays.  Re-check in 4 weeks.

## 2019-08-14 ENCOUNTER — Other Ambulatory Visit: Payer: Self-pay | Admitting: Family Medicine

## 2019-08-14 DIAGNOSIS — N138 Other obstructive and reflux uropathy: Secondary | ICD-10-CM

## 2019-08-14 DIAGNOSIS — N390 Urinary tract infection, site not specified: Secondary | ICD-10-CM

## 2019-08-14 DIAGNOSIS — R338 Other retention of urine: Secondary | ICD-10-CM | POA: Diagnosis not present

## 2019-08-14 DIAGNOSIS — R319 Hematuria, unspecified: Secondary | ICD-10-CM | POA: Diagnosis not present

## 2019-08-14 DIAGNOSIS — N401 Enlarged prostate with lower urinary tract symptoms: Secondary | ICD-10-CM | POA: Diagnosis not present

## 2019-08-14 DIAGNOSIS — R339 Retention of urine, unspecified: Secondary | ICD-10-CM | POA: Insufficient documentation

## 2019-08-14 HISTORY — DX: Benign prostatic hyperplasia with lower urinary tract symptoms: N13.8

## 2019-08-14 HISTORY — DX: Urinary tract infection, site not specified: N39.0

## 2019-08-14 HISTORY — DX: Other obstructive and reflux uropathy: N40.1

## 2019-08-14 HISTORY — DX: Retention of urine, unspecified: R33.9

## 2019-08-20 ENCOUNTER — Encounter: Payer: Self-pay | Admitting: Family Medicine

## 2019-08-21 ENCOUNTER — Ambulatory Visit (INDEPENDENT_AMBULATORY_CARE_PROVIDER_SITE_OTHER): Payer: Medicare HMO | Admitting: General Practice

## 2019-08-21 ENCOUNTER — Other Ambulatory Visit: Payer: Self-pay

## 2019-08-21 DIAGNOSIS — Z7901 Long term (current) use of anticoagulants: Secondary | ICD-10-CM

## 2019-08-21 LAB — POCT INR: INR: 2 (ref 2.0–3.0)

## 2019-08-21 NOTE — Patient Instructions (Addendum)
Pre visit review using our clinic review tool, if applicable. No additional management support is needed unless otherwise documented below in the visit note.  Continue to take 1 tablet daily except take 1/2 tablet on Sundays.  Re-check in 4 weeks.

## 2019-08-21 NOTE — Telephone Encounter (Signed)
Patients wife says that Caren Griffins called and left a voicemail telling them they have information to go over and wife would like if she called husbands cell phone number around 2 [phone]

## 2019-08-28 DIAGNOSIS — N138 Other obstructive and reflux uropathy: Secondary | ICD-10-CM | POA: Diagnosis not present

## 2019-08-28 DIAGNOSIS — R338 Other retention of urine: Secondary | ICD-10-CM | POA: Diagnosis not present

## 2019-08-28 DIAGNOSIS — N401 Enlarged prostate with lower urinary tract symptoms: Secondary | ICD-10-CM | POA: Diagnosis not present

## 2019-09-09 ENCOUNTER — Ambulatory Visit: Payer: Medicare HMO

## 2019-09-09 DIAGNOSIS — Z961 Presence of intraocular lens: Secondary | ICD-10-CM | POA: Diagnosis not present

## 2019-09-09 DIAGNOSIS — Z0100 Encounter for examination of eyes and vision without abnormal findings: Secondary | ICD-10-CM | POA: Diagnosis not present

## 2019-09-11 ENCOUNTER — Ambulatory Visit: Payer: Medicare HMO

## 2019-09-18 ENCOUNTER — Ambulatory Visit (INDEPENDENT_AMBULATORY_CARE_PROVIDER_SITE_OTHER): Payer: Medicare HMO | Admitting: General Practice

## 2019-09-18 ENCOUNTER — Other Ambulatory Visit: Payer: Self-pay

## 2019-09-18 DIAGNOSIS — Z7901 Long term (current) use of anticoagulants: Secondary | ICD-10-CM

## 2019-09-18 LAB — POCT INR: INR: 3.2 — AB (ref 2.0–3.0)

## 2019-09-18 NOTE — Progress Notes (Signed)
I have reviewed the results and agree with this plan   

## 2019-09-18 NOTE — Patient Instructions (Addendum)
Pre visit review using our clinic review tool, if applicable. No additional management support is needed unless otherwise documented below in the visit note.  Hold tomorrow (8/5) and then continue to take 1 tablet daily except take 1/2 tablet on Sundays.  Re-check in 4 weeks.

## 2019-09-23 ENCOUNTER — Emergency Department
Admission: EM | Admit: 2019-09-23 | Discharge: 2019-09-23 | Disposition: A | Discharge status: Home / Self Care | Payer: Medicare HMO | Source: Home / Self Care

## 2019-09-23 DIAGNOSIS — R0981 Nasal congestion: Secondary | ICD-10-CM

## 2019-09-23 DIAGNOSIS — R310 Gross hematuria: Secondary | ICD-10-CM

## 2019-09-23 DIAGNOSIS — R319 Hematuria, unspecified: Secondary | ICD-10-CM | POA: Diagnosis not present

## 2019-09-23 DIAGNOSIS — J3489 Other specified disorders of nose and nasal sinuses: Secondary | ICD-10-CM

## 2019-09-23 LAB — POCT URINALYSIS DIP (MANUAL ENTRY)
Bilirubin, UA: NEGATIVE
Glucose, UA: NEGATIVE mg/dL
Ketones, POC UA: NEGATIVE mg/dL
Nitrite, UA: NEGATIVE
Protein Ur, POC: NEGATIVE mg/dL
Spec Grav, UA: 1.025 (ref 1.010–1.025)
Urobilinogen, UA: 0.2 E.U./dL
pH, UA: 6.5 (ref 5.0–8.0)

## 2019-09-23 NOTE — Discharge Instructions (Signed)
  Please call your primary care provider to schedule a follow up visit later this week for recheck of symptoms and to discuss PSA.

## 2019-09-23 NOTE — ED Provider Notes (Signed)
[name]    CSN: [id number] Arrival date & time: 09/23/19  1506      History   Chief Complaint Chief Complaint  Patient presents with  . Urinary Tract Infection    HPI William Norman is a 79 y.o. male.   HPI  William Norman is a 79 y.o. male presenting to UC with c/o passing a blood clot in his urine yesterday.  He was treated for a UTI with flomax recently, he is currently still taking flomax but was concerned about the blood clot in his urine yesterday. Denies pain or difficulty urinating. Denies fever, chills, back or abdominal pain. No hx of kidney stones.   Pt also reports nasal congestion with a slight runny nose for about 2 days. He has received the Covid-19 vaccine but wonders if he needs a booster based on information he heard on the radio. No known sick contacts. He would like to be tested.    Past Medical History:  Diagnosis Date  . BPH (benign prostatic hypertrophy)   . BPH associated with nocturia 08/29/2013  . CAD (coronary artery disease)   . CAD (coronary artery disease), native coronary artery    PTCA of RCA 1986, PTCA of LAD 1992,  Negative treadmill Cardiolite 2011   . Chronic dermatitis of hands 10/12/2015  . Chronic venous insufficiency   . DVT (deep venous thrombosis) ([place])   . Dyslipidemia   . Eczema   . Erectile dysfunction 07/11/2012  . Essential hypertension   . GERD 08/04/2006      . GERD (gastroesophageal reflux disease)   . Hyperlipidemia   . Hypertension   . Long term current use of anticoagulant therapy   . Lumbar disc disease   . Overweight (BMI 25.0-29.9) 06/18/2015  . Personal history of DVT (deep vein thrombosis)    Initially in 1998 with recurrence in 2002 now on chronic warfarin   . Soft tissue lesion of foot 08/27/2015    Patient Active Problem List   Diagnosis Date Noted  . Hyperglycemia 07/08/2019  . Coagulation disorder ([place]) 10/26/2018  . Persistent atrial fibrillation ([place]) 04/10/2018  . Chronic  dermatitis of hands 10/12/2015  . Soft tissue lesion of foot 08/27/2015  . BPH associated with nocturia 08/29/2013  . CAD (coronary artery disease), native coronary artery   . Long term current use of anticoagulant therapy   . Personal history of DVT (deep vein thrombosis)   . Chronic venous insufficiency   . Erectile dysfunction 07/11/2012  . Lumbar disc disease   . GERD 08/04/2006  . Dyslipidemia   . Essential hypertension     Past Surgical History:  Procedure Laterality Date  . CARDIAC CATHETERIZATION    . CARPAL TUNNEL RELEASE  04/21/2011   Procedure: CARPAL TUNNEL RELEASE;  Surgeon: Tennis Must, MD;  Location: [facility];  Service: Orthopedics;  Laterality: Left;  . CARPAL TUNNEL RELEASE  05/23/2011   Procedure: CARPAL TUNNEL RELEASE;  Surgeon: Tennis Must, MD;  Location: [facility];  Service: Orthopedics;  Laterality: Right;  . COLON SURGERY  2010 and 2011 colostomy reversal  . CORONARY ANGIOPLASTY    . EYE SURGERY     repair of macular hole  . NASAL SEPTUM SURGERY    . precutaneous transluminal coronary angioplasty    . right hernia    . TONSILLECTOMY AND ADENOIDECTOMY         Home Medications    Prior to Admission medications   Medication  Sig Start Date End Date Taking? Authorizing Provider  amLODipine (NORVASC) 5 MG tablet Take 1 tablet (5 mg total) by mouth every other day. 04/05/19   Vivi Barrack, MD  atenolol (TENORMIN) 100 MG tablet TAKE 1 TABLET BY MOUTH EVERY DAY 08/14/19   Vivi Barrack, MD  atorvastatin (LIPITOR) 40 MG tablet Take 0.5 tablets (20 mg total) by mouth daily. 07/08/19   Vivi Barrack, MD  Coenzyme Q10 (COQ10) 200 MG CAPS Take 1 capsule by mouth daily.     [provider]  hydroxypropyl methylcellulose / hypromellose (ISOPTO TEARS / GONIOVISC) 2.5 % ophthalmic solution Place 1 drop into both eyes 4 (four) times daily as needed for dry eyes.    [provider]  lisinopril (ZESTRIL) 40 MG tablet  TAKE 1 TABLET BY MOUTH EVERY DAY 06/25/19   Vivi Barrack, MD  nitroGLYCERIN (NITROSTAT) 0.4 MG SL tablet Dissolve 1 tablet under the tongue every 5 minutes as needed 01/19/17   Vivi Barrack, MD  warfarin (COUMADIN) 5 MG tablet TAKE 1 TABLET BY MOUTH DAILY OR AS DIRECTED BY ANTICOAGULATION CLINIC. 06/25/19   Vivi Barrack, MD    Family History Family History  Problem Relation Age of Onset  . Heart disease Father   . Heart attack Father   . Heart disease Mother   . AAA (abdominal aortic aneurysm) Brother     Social History Social History   Tobacco Use  . Smoking status: Former Smoker    Packs/day: 1.00    Years: 60.00    Pack years: 60.00    Types: Cigarettes    Quit date: 03/25/1998    Years since quitting: 21.5  . Smokeless tobacco: Never Used  Vaping Use  . Vaping Use: Never used  Substance Use Topics  . Alcohol use: Yes    Alcohol/week: 7.0 standard drinks    Types: 7 Glasses of wine per week    Comment: 7 glasses of red wine per week   . Drug use: No     Allergies   Other and Simvastatin   Review of Systems Review of Systems  Constitutional: Negative for chills and fever.  HENT: Positive for congestion (minimal) and rhinorrhea. Negative for ear pain, sore throat, trouble swallowing and voice change.   Respiratory: Negative for cough and shortness of breath.   Cardiovascular: Negative for chest pain and palpitations.  Gastrointestinal: Negative for abdominal pain, diarrhea, nausea and vomiting.  Genitourinary: Positive for hematuria. Negative for dysuria, flank pain and frequency.  Musculoskeletal: Negative for arthralgias, back pain and myalgias.  Skin: Negative for rash.  All other systems reviewed and are negative.    Physical Exam Triage Vital Signs ED Triage Vitals  Enc Vitals Group     BP 09/23/19 1521 (!) 148/90     Pulse Rate 09/23/19 1521 77     Resp 09/23/19 1521 16     Temp 09/23/19 1521 98.3 F (36.8 C)     Temp Source 09/23/19 1521  Oral     SpO2 09/23/19 1521 97 %     Weight --      Height --      Head Circumference --      Peak Flow --      Pain Score 09/23/19 1519 0     Pain Loc --      Pain Edu? --      Excl. in [place]? --    No data found.  Updated Vital Signs BP Marland Kitchen)  148/90 (BP Location: Right Arm)   Pulse 77   Temp 98.3 F (36.8 C) (Oral)   Resp 16   SpO2 97%   Visual Acuity Right Eye Distance:   Left Eye Distance:   Bilateral Distance:    Right Eye Near:   Left Eye Near:    Bilateral Near:     Physical Exam Vitals and nursing note reviewed.  Constitutional:      General: He is not in acute distress.    Appearance: Normal appearance. He is well-developed. He is not ill-appearing, toxic-appearing or diaphoretic.  HENT:     Head: Normocephalic and atraumatic.     Right Ear: Tympanic membrane and ear canal normal.     Left Ear: Tympanic membrane and ear canal normal.     Nose: Nose normal.     Mouth/Throat:     Mouth: Mucous membranes are moist.     Pharynx: Oropharynx is clear.  Cardiovascular:     Rate and Rhythm: Normal rate and regular rhythm.  Pulmonary:     Effort: Pulmonary effort is normal. No respiratory distress.     Breath sounds: Normal breath sounds.  Abdominal:     General: There is no distension.     Palpations: Abdomen is soft.     Tenderness: There is no abdominal tenderness. There is no right CVA tenderness or left CVA tenderness.  Musculoskeletal:        General: Normal range of motion.     Cervical back: Normal range of motion.  Skin:    General: Skin is warm and dry.  Neurological:     Mental Status: He is alert and oriented to person, place, and time.  Psychiatric:        Behavior: Behavior normal.      UC Treatments / Results  Labs (all labs ordered are listed, but only abnormal results are displayed) Labs Reviewed  POCT URINALYSIS DIP (MANUAL ENTRY) - Abnormal; Notable for the following components:      Result Value   Blood, UA trace-intact (*)     Leukocytes, UA Trace (*)    All other components within normal limits  NOVEL CORONAVIRUS, NAA   Narrative:    Performed at:  [address], [place], [place]  [id number] Lab Director: Rush Farmer MD, Phone:  [phone]  URINE CULTURE  SARS-COV-2, NAA 2 DAY TAT   Narrative:    Performed at:  [address], [place], [place]  [id number] Lab Director: Rush Farmer MD, Phone:  [phone]    EKG   Radiology No results found.  Procedures Procedures (including critical care time)  Medications Ordered in UC Medications - No data to display  Initial Impression / Assessment and Plan / UC Course  I have reviewed the triage vital signs and the nursing notes.  Pertinent labs & imaging results that were available during my care of the patient were reviewed by me and considered in my medical decision making (see chart for details).    Discussed UA with pt Encouraged f/u with urology, keep taking Flomax as prescribed Will hold off on antibiotics Encouraged f/u with PCP to discuss Covid booster, advised it has not been officially approved/recommended yet.  Final Clinical Impressions(s) / UC Diagnoses   Final diagnoses:  Nasal congestion with rhinorrhea  Gross hematuria  Hematuria of unknown cause     Discharge Instructions      Please call your primary care provider to schedule a follow  up visit later this week for recheck of symptoms and to discuss PSA.     ED Prescriptions    None     PDMP not reviewed this encounter.   Noe Gens, PA-C 09/25/19 1022

## 2019-09-23 NOTE — ED Triage Notes (Signed)
Patient presents to Urgent Care with complaints of possible UTI since yesterday. Patient reports he was recently diagnosed with a UTI, was put on flomax by urology and his sx went away. Then yesterday, he passed what he thinks is a blood clot. Pt would also like to be tested for covid, has been vaccinated but he heard on the radio that people are getting boosters and he has a runny nose so would like to be tested.

## 2019-09-25 LAB — NOVEL CORONAVIRUS, NAA: SARS-CoV-2, NAA: NOT DETECTED

## 2019-09-25 LAB — URINE CULTURE
MICRO NUMBER:: 10807019
SPECIMEN QUALITY:: ADEQUATE

## 2019-09-25 LAB — SARS-COV-2, NAA 2 DAY TAT

## 2019-09-26 ENCOUNTER — Other Ambulatory Visit: Payer: Self-pay | Admitting: Family Medicine

## 2019-09-30 ENCOUNTER — Other Ambulatory Visit: Payer: Self-pay

## 2019-09-30 ENCOUNTER — Ambulatory Visit (INDEPENDENT_AMBULATORY_CARE_PROVIDER_SITE_OTHER): Payer: Medicare HMO | Admitting: Family Medicine

## 2019-09-30 VITALS — BP 138/80 | HR 69 | Temp 97.5°F | Ht 72.0 in | Wt 218.4 lb

## 2019-09-30 DIAGNOSIS — R351 Nocturia: Secondary | ICD-10-CM

## 2019-09-30 DIAGNOSIS — R319 Hematuria, unspecified: Secondary | ICD-10-CM

## 2019-09-30 DIAGNOSIS — I10 Essential (primary) hypertension: Secondary | ICD-10-CM | POA: Diagnosis not present

## 2019-09-30 DIAGNOSIS — D519 Vitamin B12 deficiency anemia, unspecified: Secondary | ICD-10-CM | POA: Diagnosis not present

## 2019-09-30 DIAGNOSIS — N401 Enlarged prostate with lower urinary tract symptoms: Secondary | ICD-10-CM | POA: Diagnosis not present

## 2019-09-30 NOTE — Assessment & Plan Note (Signed)
At goal.  Continue amlodipine 5 mg daily, atenolol 100 mg daily, lisinopril 40 mg daily.

## 2019-09-30 NOTE — Assessment & Plan Note (Signed)
Possibly associated with his hematuria.  Will place referral to urology.  He will continue Flomax.

## 2019-09-30 NOTE — Patient Instructions (Signed)
It was very nice to see you today!  I will place a referral for you to see the urologist.  We will check blood work today.  We will contact you later this year.  Take care, Dr [name]  Please try these tips to maintain a healthy lifestyle:   Eat at least 3 REAL meals and 1-2 snacks per day.  Aim for no more than 5 hours between eating.  If you eat breakfast, please do so within one hour of getting up.    Each meal should contain half fruits/vegetables, one quarter protein, and one quarter carbs (no bigger than a computer mouse)   Cut down on sweet beverages. This includes juice, soda, and sweet tea.     Drink at least 1 glass of water with each meal and aim for at least 8 glasses per day   Exercise at least 150 minutes every week.

## 2019-09-30 NOTE — Progress Notes (Signed)
   William Norman is a 79 y.o. male who presents today for an office visit.  Assessment/Plan:  New/Acute Problems: Hematuria We will place referral to urology.  Check CBC, CMET, TSH  Fatigue Check CBC, CMET, TSH, B12.  Chronic Problems Addressed Today: Essential hypertension At goal.  Continue amlodipine 5 mg daily, atenolol 100 mg daily, lisinopril 40 mg daily.  BPH associated with nocturia Possibly associated with his hematuria.  Will place referral to urology.  He will continue Flomax.     Subjective:  HPI:  Patient had single episode of hematuria about a week ago.  At that point urgent care.  UA at that time showed trace hemoglobin.  He has seen urology in the past.  Has been put on Flomax to help with BPH.  Since then.  No history of kidney stones.  No dysuria.  No flank pain.  No fevers or chills.  No difficulty with urination.      Objective:  Physical Exam: BP 138/80   Pulse 69   Temp (!) 97.5 F (36.4 C)   Ht 6' (1.829 m)   Wt 218 lb 6.4 oz (99.1 kg)   SpO2 94%   BMI 29.62 kg/m   Gen: No acute distress, resting comfortably CV: Regular rate and rhythm with no murmurs appreciated Pulm: Normal work of breathing, clear to auscultation bilaterally with no crackles, wheezes, or rhonchi Neuro: Grossly normal, moves all extremities Psych: Normal affect and thought content      William Arney M. [name], MD 09/30/2019 1:51 PM

## 2019-10-01 ENCOUNTER — Ambulatory Visit: Payer: Medicare HMO | Admitting: Podiatry

## 2019-10-01 ENCOUNTER — Encounter: Payer: Self-pay | Admitting: Podiatry

## 2019-10-01 DIAGNOSIS — B351 Tinea unguium: Secondary | ICD-10-CM

## 2019-10-01 DIAGNOSIS — D689 Coagulation defect, unspecified: Secondary | ICD-10-CM

## 2019-10-01 DIAGNOSIS — I872 Venous insufficiency (chronic) (peripheral): Secondary | ICD-10-CM

## 2019-10-01 DIAGNOSIS — M79676 Pain in unspecified toe(s): Secondary | ICD-10-CM | POA: Diagnosis not present

## 2019-10-01 LAB — COMPREHENSIVE METABOLIC PANEL
AG Ratio: 1.7 (calc) (ref 1.0–2.5)
ALT: 17 U/L (ref 9–46)
AST: 22 U/L (ref 10–35)
Albumin: 3.8 g/dL (ref 3.6–5.1)
Alkaline phosphatase (APISO): 48 U/L (ref 35–144)
BUN: 14 mg/dL (ref 7–25)
CO2: 27 mmol/L (ref 20–32)
Calcium: 9.8 mg/dL (ref 8.6–10.3)
Chloride: 105 mmol/L (ref 98–110)
Creat: 0.84 mg/dL (ref 0.70–1.18)
Globulin: 2.3 g/dL (calc) (ref 1.9–3.7)
Glucose, Bld: 83 mg/dL (ref 65–99)
Potassium: 4.7 mmol/L (ref 3.5–5.3)
Sodium: 139 mmol/L (ref 135–146)
Total Bilirubin: 1.1 mg/dL (ref 0.2–1.2)
Total Protein: 6.1 g/dL (ref 6.1–8.1)

## 2019-10-01 LAB — TSH: TSH: 2.86 mIU/L (ref 0.40–4.50)

## 2019-10-01 LAB — VITAMIN B12: Vitamin B-12: 271 pg/mL (ref 200–1100)

## 2019-10-01 LAB — CBC
HCT: 47.3 % (ref 38.5–50.0)
Hemoglobin: 15.5 g/dL (ref 13.2–17.1)
MCH: 29.6 pg (ref 27.0–33.0)
MCHC: 32.8 g/dL (ref 32.0–36.0)
MCV: 90.3 fL (ref 80.0–100.0)
MPV: 10.7 fL (ref 7.5–12.5)
Platelets: 198 10*3/uL (ref 140–400)
RBC: 5.24 10*6/uL (ref 4.20–5.80)
RDW: 13.7 % (ref 11.0–15.0)
WBC: 5.9 10*3/uL (ref 3.8–10.8)

## 2019-10-01 NOTE — Progress Notes (Signed)
Order(s) created erroneously. Erroneous order ID: [id number]  Order moved by: Sheliah Hatch  Order move date/time: 10/01/2019 11:44 AM  Source Patient: [id number]  Source Contact: 09/30/2019  Destination Patient: [id number]  Destination Contact: 12/05/2018

## 2019-10-01 NOTE — Progress Notes (Signed)
This patient returns to my office for at risk foot care.  This patient requires this care by a professional since this patient will be at risk due to having venous insufficiency and coagulation defect.  Patient is taking coumadin.  This patient is unable to cut nails himself since the patient cannot reach his nails.These nails are painful walking and wearing shoes.  This patient presents for at risk foot care today.  General Appearance  Alert, conversant and in no acute stress.  Vascular  Dorsalis pedis and posterior tibial  pulses are palpable  bilaterally.  Capillary return is within normal limits  bilaterally. Temperature is within normal limits  bilaterally.  Neurologic  Senn-Weinstein monofilament wire test within normal limits  bilaterally. Muscle power within normal limits bilaterally.  Nails Thick disfigured discolored nails with subungual debris  from hallux to fifth toes bilaterally. No evidence of bacterial infection or drainage bilaterally.  Orthopedic  No limitations of motion  feet .  No crepitus or effusions noted.  No bony pathology or digital deformities noted.  Skin  normotropic skin with no porokeratosis noted bilaterally.  No signs of infections or ulcers noted.     Onychomycosis  Pain in right toes  Pain in left toes  Consent was obtained for treatment procedures.   Mechanical debridement of nails 1-5  bilaterally performed with a nail nipper.  Filed with dremel without incident.    Return office visit   10 weeks                  Told patient to return for periodic foot care and evaluation due to potential at risk complications.   Gardiner Barefoot DPM

## 2019-10-16 ENCOUNTER — Other Ambulatory Visit: Payer: Self-pay

## 2019-10-16 ENCOUNTER — Ambulatory Visit (INDEPENDENT_AMBULATORY_CARE_PROVIDER_SITE_OTHER): Payer: Medicare HMO | Admitting: General Practice

## 2019-10-16 DIAGNOSIS — Z7901 Long term (current) use of anticoagulants: Secondary | ICD-10-CM

## 2019-10-16 LAB — POCT INR: INR: 3 (ref 2.0–3.0)

## 2019-10-16 NOTE — Patient Instructions (Addendum)
Pre visit review using our clinic review tool, if applicable. No additional management support is needed unless otherwise documented below in the visit note.  Hold tomorrow (9/2) and then continue to take 1 tablet daily except take 1/2 tablet on Sundays.  Re-check in 4 to 5 weeks.

## 2019-11-06 DIAGNOSIS — R69 Illness, unspecified: Secondary | ICD-10-CM | POA: Diagnosis not present

## 2019-11-18 ENCOUNTER — Ambulatory Visit: Payer: Medicare HMO

## 2019-11-20 ENCOUNTER — Other Ambulatory Visit: Payer: Self-pay

## 2019-11-20 ENCOUNTER — Ambulatory Visit (INDEPENDENT_AMBULATORY_CARE_PROVIDER_SITE_OTHER): Payer: Medicare HMO | Admitting: General Practice

## 2019-11-20 DIAGNOSIS — Z7901 Long term (current) use of anticoagulants: Secondary | ICD-10-CM | POA: Diagnosis not present

## 2019-11-20 LAB — POCT INR: INR: 2.2 (ref 2.0–3.0)

## 2019-11-20 NOTE — Patient Instructions (Addendum)
Pre visit review using our clinic review tool, if applicable. No additional management support is needed unless otherwise documented below in the visit note.  Continue to take 1 tablet daily except take 1/2 tablet on Sundays.  Re-check in 6 weeks.   

## 2019-11-20 NOTE — Progress Notes (Signed)
I have reviewed the results and agree with this plan   

## 2019-12-02 DIAGNOSIS — L309 Dermatitis, unspecified: Secondary | ICD-10-CM | POA: Insufficient documentation

## 2019-12-02 DIAGNOSIS — I10 Essential (primary) hypertension: Secondary | ICD-10-CM | POA: Insufficient documentation

## 2019-12-02 DIAGNOSIS — I2510 Atherosclerotic heart disease of native coronary artery without angina pectoris: Secondary | ICD-10-CM | POA: Insufficient documentation

## 2019-12-02 DIAGNOSIS — K219 Gastro-esophageal reflux disease without esophagitis: Secondary | ICD-10-CM | POA: Insufficient documentation

## 2019-12-02 DIAGNOSIS — I82409 Acute embolism and thrombosis of unspecified deep veins of unspecified lower extremity: Secondary | ICD-10-CM | POA: Insufficient documentation

## 2019-12-02 DIAGNOSIS — E785 Hyperlipidemia, unspecified: Secondary | ICD-10-CM | POA: Insufficient documentation

## 2019-12-03 ENCOUNTER — Ambulatory Visit: Payer: Medicare HMO | Admitting: Cardiology

## 2019-12-03 ENCOUNTER — Encounter: Payer: Self-pay | Admitting: Cardiology

## 2019-12-03 ENCOUNTER — Other Ambulatory Visit: Payer: Self-pay

## 2019-12-03 VITALS — BP 144/74 | HR 68 | Ht 72.0 in | Wt 211.0 lb

## 2019-12-03 DIAGNOSIS — R7303 Prediabetes: Secondary | ICD-10-CM | POA: Diagnosis not present

## 2019-12-03 DIAGNOSIS — I824Y9 Acute embolism and thrombosis of unspecified deep veins of unspecified proximal lower extremity: Secondary | ICD-10-CM

## 2019-12-03 DIAGNOSIS — I4819 Other persistent atrial fibrillation: Secondary | ICD-10-CM

## 2019-12-03 DIAGNOSIS — I2510 Atherosclerotic heart disease of native coronary artery without angina pectoris: Secondary | ICD-10-CM | POA: Diagnosis not present

## 2019-12-03 DIAGNOSIS — I119 Hypertensive heart disease without heart failure: Secondary | ICD-10-CM | POA: Diagnosis not present

## 2019-12-03 DIAGNOSIS — Z7901 Long term (current) use of anticoagulants: Secondary | ICD-10-CM | POA: Diagnosis not present

## 2019-12-03 DIAGNOSIS — E785 Hyperlipidemia, unspecified: Secondary | ICD-10-CM

## 2019-12-03 NOTE — Patient Instructions (Signed)
Medication Instructions:  Your physician recommends that you continue on your current medications as directed. Please refer to the Current Medication list given to you today.  *If you need a refill on your cardiac medications before your next appointment, please call your pharmacy*   Lab Work: Your physician recommends that you return for lab work in: TODAY CMP, Lipids, HGB A1C If you have labs (blood work) drawn today and your tests are completely normal, you will receive your results only by: Marland Kitchen MyChart Message (if you have MyChart) OR . A paper copy in the mail If you have any lab test that is abnormal or we need to change your treatment, we will call you to review the results.   Testing/Procedures: None   Follow-Up: At [facility], you and your health needs are our priority.  As part of our continuing mission to provide you with exceptional heart care, we have created designated Provider Care Teams.  These Care Teams include your primary Cardiologist (physician) and Advanced Practice Providers (APPs -  Physician Assistants and Nurse Practitioners) who all work together to provide you with the care you need, when you need it.  We recommend signing up for the patient portal called "MyChart".  Sign up information is provided on this After Visit Summary.  MyChart is used to connect with patients for Virtual Visits (Telemedicine).  Patients are able to view lab/test results, encounter notes, upcoming appointments, etc.  Non-urgent messages can be sent to your provider as well.   To learn more about what you can do with MyChart, go to [email or web address].    Your next appointment:   6 month(s)  The format for your next appointment:   In Person  Provider:   Shirlee More, MD   Other Instructions

## 2019-12-03 NOTE — Progress Notes (Signed)
Cardiology Office Note:    Date:  12/03/2019   ID:  William Norman, DOB [birth year], MRN [id number]  PCP:  Vivi Barrack, MD  Cardiologist:  Shirlee More, MD    Referring MD: Vivi Barrack, MD    ASSESSMENT:    1. Coronary artery disease involving native coronary artery of native heart without angina pectoris   2. Deep vein thrombosis (DVT) of proximal lower extremity, unspecified chronicity, unspecified laterality ([place])   3. Long term current use of anticoagulant therapy   4. Persistent atrial fibrillation ([place])   5. Hypertensive heart disease without heart failure   6. Dyslipidemia   7. Prediabetes    PLAN:    In order of problems listed above:  1. Stable New York Heart Association class I no angina present treatment continue beta-blocker calcium channel blocker statin would not advise an ischemia evaluation at this time 2. Continue anticoagulation also has atrial fibrillation chronic 3. Stable hypertension continue treatment including ACE inhibitor 4. Continue statin check lipid profile liver function 5. Check A1c   Next appointment: 6 months   Medication Adjustments/Labs and Tests Ordered: Current medicines are reviewed at length with the patient today.  Concerns regarding medicines are outlined above.  Orders Placed This Encounter  Procedures  . Comprehensive metabolic panel  . Lipid panel  . HgB A1c  . EKG 12-Lead   No orders of the defined types were placed in this encounter.   No chief complaint on file.   History of Present Illness:    William Norman is a 79 y.o. male with a hx of CAD remote PCI of the LAD in 1992, history of deep vein thrombosis on chronic anticoagulant therapy hypertension hyperlipidemia and persistent atrial fibrillation.  Echocardiogram in March 2020 showed moderate to severe left and right atrial enlargement normal left ventricular function and a 7-day event monitor showed well-controlled atrial fibrillation at that  time and he was continued on rate control and anticoagulation.  He was last seen 05/27/2019. Compliance with diet, lifestyle and medications: Yes  Remains active he just needs to stop and rest when he does garden work.  He is concerned about the lower extremities that show varicosities chronic problem.  He tells me his A1c was borderline at request to repeat today.  He is taking over-the-counter vitamin B12 for deficiency.  No palpitations syncope bleeding from warfarin chest pain shortness of breath or edema.  Past Medical History:  Diagnosis Date  . BPH (benign prostatic hypertrophy)   . BPH associated with nocturia 08/29/2013  . BPH with obstruction/lower urinary tract symptoms 08/14/2019  . CAD (coronary artery disease)   . CAD (coronary artery disease), native coronary artery    PTCA of RCA 1986, PTCA of LAD 1992,  Negative treadmill Cardiolite 2011   . Chronic dermatitis of hands 10/12/2015  . Chronic venous insufficiency   . Coagulation disorder ([place]) 10/26/2018  . DVT (deep venous thrombosis) ([place])   . Dyslipidemia   . Eczema   . Erectile dysfunction 07/11/2012  . Essential hypertension   . GERD 08/04/2006      . GERD (gastroesophageal reflux disease)   . Hyperglycemia 07/08/2019  . Hyperlipidemia   . Hypertension   . Incomplete emptying of bladder 08/14/2019  . Long term current use of anticoagulant therapy   . Lumbar disc disease   . Overweight (BMI 25.0-29.9) 06/18/2015  . Persistent atrial fibrillation ([place]) 04/10/2018  . Personal history of DVT (deep vein thrombosis)    Initially  in 1998 with recurrence in 2002 now on chronic warfarin   . Soft tissue lesion of foot 08/27/2015  . Urinary tract infection with hematuria 08/14/2019    Past Surgical History:  Procedure Laterality Date  . CARDIAC CATHETERIZATION    . CARPAL TUNNEL RELEASE  04/21/2011   Procedure: CARPAL TUNNEL RELEASE;  Surgeon: Tennis Must, MD;  Location: [facility];  Service: Orthopedics;   Laterality: Left;  . CARPAL TUNNEL RELEASE  05/23/2011   Procedure: CARPAL TUNNEL RELEASE;  Surgeon: Tennis Must, MD;  Location: [facility];  Service: Orthopedics;  Laterality: Right;  . COLON SURGERY  2010 and 2011 colostomy reversal  . CORONARY ANGIOPLASTY    . EYE SURGERY     repair of macular hole  . NASAL SEPTUM SURGERY    . precutaneous transluminal coronary angioplasty    . right hernia    . TONSILLECTOMY AND ADENOIDECTOMY      Current Medications: Current Meds  Medication Sig  . amLODipine (NORVASC) 5 MG tablet Take 1 tablet (5 mg total) by mouth every other day.  Marland Kitchen atenolol (TENORMIN) 100 MG tablet TAKE 1 TABLET BY MOUTH EVERY DAY  . atorvastatin (LIPITOR) 40 MG tablet Take 0.5 tablets (20 mg total) by mouth daily.  . Coenzyme Q10 (COQ10) 200 MG CAPS Take 1 capsule by mouth daily.   . Cranberry 500 MG CAPS Take 1 capsule by mouth daily.  . Cyanocobalamin (VITAMIN B-12) 1000 MCG SUBL Place 1 tablet under the tongue daily.  . hydroxypropyl methylcellulose / hypromellose (ISOPTO TEARS / GONIOVISC) 2.5 % ophthalmic solution Place 1 drop into both eyes 4 (four) times daily as needed for dry eyes.  Marland Kitchen lisinopril (ZESTRIL) 40 MG tablet TAKE 1 TABLET BY MOUTH EVERY DAY  . Melatonin ([employer] MELATONIN) 5 MG SUBL Place 1 tablet under the tongue at bedtime.  . nitroGLYCERIN (NITROSTAT) 0.4 MG SL tablet Dissolve 1 tablet under the tongue every 5 minutes as needed  . warfarin (COUMADIN) 5 MG tablet TAKE 1 TABLET BY MOUTH DAILY OR AS DIRECTED BY ANTICOAGULATION CLINIC.     Allergies:   Other and Simvastatin   Social History   Socioeconomic History  . Marital status: Married    Spouse name: Not on file  . Number of children: 3  . Years of education: 42  . Highest education level: Not on file  Occupational History  . Occupation: Retired  Tobacco Use  . Smoking status: Former Smoker    Packs/day: 1.00    Years: 60.00    Pack years: 60.00    Types: Cigarettes    Quit  date: 03/25/1998    Years since quitting: 21.7  . Smokeless tobacco: Never Used  Vaping Use  . Vaping Use: Never used  Substance and Sexual Activity  . Alcohol use: Yes    Alcohol/week: 7.0 standard drinks    Types: 7 Glasses of wine per week    Comment: 7 glasses of red wine per week   . Drug use: No  . Sexual activity: Not on file  Other Topics Concern  . Not on file  Social History Narrative   Fun: Working outside in yard, cutting wood    Denies abuse and feels safe at home.    Social Determinants of Health   Financial Resource Strain:   . Difficulty of Paying Living Expenses: Not on file  Food Insecurity:   . Worried About Charity fundraiser in the Last Year: Not on  file  . [place] in the Last Year: Not on file  Transportation Needs:   . Lack of Transportation (Medical): Not on file  . Lack of Transportation (Non-Medical): Not on file  Physical Activity:   . Days of Exercise per Week: Not on file  . Minutes of Exercise per Session: Not on file  Stress:   . Feeling of Stress : Not on file  Social Connections:   . Frequency of Communication with Friends and Family: Not on file  . Frequency of Social Gatherings with Friends and Family: Not on file  . Attends Religious Services: Not on file  . Active Member of Clubs or Organizations: Not on file  . Attends Archivist Meetings: Not on file  . Marital Status: Not on file     Family History: The patient's family history includes AAA (abdominal aortic aneurysm) in his brother; Heart attack in his father; Heart disease in his father and mother. ROS:   Please see the history of present illness.    All other systems reviewed and are negative.  EKGs/Labs/Other Studies Reviewed:    The following studies were reviewed today:  EKG:  EKG ordered today and personally reviewed.  The ekg ordered today demonstrates rate controlled atrial fibrillation  Recent Labs: 09/30/2019: ALT 17; BUN 14; Creat 0.84;  Hemoglobin 15.5; Platelets 198; Potassium 4.7; Sodium 139; TSH 2.86  Recent Lipid Panel    Component Value Date/Time   CHOL 130 07/08/2019 1427   CHOL 132 10/24/2018 1607   TRIG 67.0 07/08/2019 1427   TRIG 68 01/10/2006 1108   HDL 46.10 07/08/2019 1427   HDL 44 10/24/2018 1607   CHOLHDL 3 07/08/2019 1427   VLDL 13.4 07/08/2019 1427   LDLCALC 71 07/08/2019 1427   LDLCALC 69 10/24/2018 1607   LDLDIRECT 131.5 04/22/2010 1018    Physical Exam:    VS:  BP (!) 144/74   Pulse 68   Ht 6' (1.829 m)   Wt 211 lb (95.7 kg)   SpO2 96%   BMI 28.62 kg/m     Wt Readings from Last 3 Encounters:  12/03/19 211 lb (95.7 kg)  09/30/19 218 lb 6.4 oz (99.1 kg)  07/08/19 215 lb 6.4 oz (97.7 kg)     GEN:  Well nourished, well developed in no acute distress HEENT: Normal NECK: No JVD; No carotid bruits LYMPHATICS: No lymphadenopathy CARDIAC: Variable S1 irregular rhythm no murmurs, rubs, gallops RESPIRATORY:  Clear to auscultation without rales, wheezing or rhonchi  ABDOMEN: Soft, non-tender, non-distended MUSCULOSKELETAL:  No edema; No deformity he has marked venous varicosities lower extremity bilateral SKIN: Warm and dry NEUROLOGIC:  Alert and oriented x 3 PSYCHIATRIC:  Normal affect    Signed, Shirlee More, MD  12/03/2019 3:07 PM    [facility]

## 2019-12-04 ENCOUNTER — Telehealth: Payer: Self-pay | Admitting: Cardiology

## 2019-12-04 ENCOUNTER — Telehealth: Payer: Self-pay

## 2019-12-04 ENCOUNTER — Ambulatory Visit: Payer: Medicare HMO | Admitting: Cardiology

## 2019-12-04 LAB — COMPREHENSIVE METABOLIC PANEL
ALT: 21 IU/L (ref 0–44)
AST: 26 IU/L (ref 0–40)
Albumin/Globulin Ratio: 1.8 (ref 1.2–2.2)
Albumin: 4.2 g/dL (ref 3.7–4.7)
Alkaline Phosphatase: 61 IU/L (ref 44–121)
BUN/Creatinine Ratio: 12 (ref 10–24)
BUN: 10 mg/dL (ref 8–27)
Bilirubin Total: 0.9 mg/dL (ref 0.0–1.2)
CO2: 25 mmol/L (ref 20–29)
Calcium: 9.7 mg/dL (ref 8.6–10.2)
Chloride: 104 mmol/L (ref 96–106)
Creatinine, Ser: 0.83 mg/dL (ref 0.76–1.27)
GFR calc Af Amer: 97 mL/min/{1.73_m2} (ref >59)
GFR calc non Af Amer: 84 mL/min/{1.73_m2} (ref >59)
Globulin, Total: 2.3 g/dL (ref 1.5–4.5)
Glucose: 91 mg/dL (ref 65–99)
Potassium: 4.8 mmol/L (ref 3.5–5.2)
Sodium: 141 mmol/L (ref 134–144)
Total Protein: 6.5 g/dL (ref 6.0–8.5)

## 2019-12-04 LAB — LIPID PANEL
Chol/HDL Ratio: 2.7 ratio (ref 0.0–5.0)
Cholesterol, Total: 130 mg/dL (ref 100–199)
HDL: 48 mg/dL (ref >39)
LDL Chol Calc (NIH): 66 mg/dL (ref 0–99)
Triglycerides: 81 mg/dL (ref 0–149)
VLDL Cholesterol Cal: 16 mg/dL (ref 5–40)

## 2019-12-04 LAB — HEMOGLOBIN A1C
Est. average glucose Bld gHb Est-mCnc: 123 mg/dL
Hgb A1c MFr Bld: 5.9 % — ABNORMAL HIGH (ref 4.8–5.6)

## 2019-12-04 NOTE — Telephone Encounter (Signed)
Spoke with patient regarding results and recommendation.  Patient verbalizes understanding and is agreeable to plan of care. Advised patient to call back with any issues or concerns.  

## 2019-12-04 NOTE — Telephone Encounter (Signed)
Left message on patients voicemail to please return our call.   

## 2019-12-04 NOTE — Telephone Encounter (Signed)
-----   Message from Richardo Priest, MD sent at 12/04/2019  7:35 AM EDT ----- All good no changes

## 2019-12-04 NOTE — Telephone Encounter (Signed)
Followup:     pa

## 2019-12-09 DIAGNOSIS — N138 Other obstructive and reflux uropathy: Secondary | ICD-10-CM | POA: Diagnosis not present

## 2019-12-09 DIAGNOSIS — N390 Urinary tract infection, site not specified: Secondary | ICD-10-CM | POA: Diagnosis not present

## 2019-12-09 DIAGNOSIS — N401 Enlarged prostate with lower urinary tract symptoms: Secondary | ICD-10-CM | POA: Diagnosis not present

## 2019-12-09 DIAGNOSIS — Z87440 Personal history of urinary (tract) infections: Secondary | ICD-10-CM | POA: Diagnosis not present

## 2019-12-11 ENCOUNTER — Ambulatory Visit: Payer: Medicare HMO | Admitting: Podiatry

## 2019-12-11 ENCOUNTER — Encounter: Payer: Self-pay | Admitting: Podiatry

## 2019-12-11 ENCOUNTER — Other Ambulatory Visit: Payer: Self-pay

## 2019-12-11 DIAGNOSIS — B351 Tinea unguium: Secondary | ICD-10-CM

## 2019-12-11 DIAGNOSIS — I872 Venous insufficiency (chronic) (peripheral): Secondary | ICD-10-CM | POA: Diagnosis not present

## 2019-12-11 DIAGNOSIS — M79676 Pain in unspecified toe(s): Secondary | ICD-10-CM

## 2019-12-11 DIAGNOSIS — D689 Coagulation defect, unspecified: Secondary | ICD-10-CM

## 2019-12-11 NOTE — Progress Notes (Signed)
This patient returns to my office for at risk foot care.  This patient requires this care by a professional since this patient will be at risk due to having venous insufficiency and coagulation defect.  Patient is taking coumadin.  This patient is unable to cut nails himself since the patient cannot reach his nails.These nails are painful walking and wearing shoes.  This patient presents for at risk foot care today.  General Appearance  Alert, conversant and in no acute stress.  Vascular  Dorsalis pedis and posterior tibial  pulses are palpable  bilaterally.  Capillary return is within normal limits  bilaterally. Temperature is within normal limits  bilaterally.  Neurologic  Senn-Weinstein monofilament wire test within normal limits  bilaterally. Muscle power within normal limits bilaterally.  Nails Thick disfigured discolored nails with subungual debris  from hallux to fifth toes bilaterally. No evidence of bacterial infection or drainage bilaterally. Incurvation lateral border right hallux with no infection.  Orthopedic  No limitations of motion  feet .  No crepitus or effusions noted.  No bony pathology or digital deformities noted.  Skin  normotropic skin with no porokeratosis noted bilaterally.  No signs of infections or ulcers noted.     Onychomycosis  Pain in right toes  Pain in left toes  Consent was obtained for treatment procedures.   Mechanical debridement of nails 1-5  bilaterally performed with a nail nipper.  Filed with dremel without incident.    Return office visit   10 weeks                  Told patient to return for periodic foot care and evaluation due to potential at risk complications.   Gardiner Barefoot DPM

## 2020-01-01 ENCOUNTER — Other Ambulatory Visit: Payer: Self-pay

## 2020-01-01 ENCOUNTER — Ambulatory Visit (INDEPENDENT_AMBULATORY_CARE_PROVIDER_SITE_OTHER): Payer: Medicare HMO | Admitting: General Practice

## 2020-01-01 DIAGNOSIS — Z7901 Long term (current) use of anticoagulants: Secondary | ICD-10-CM

## 2020-01-01 LAB — POCT INR: INR: 2.3 (ref 2.0–3.0)

## 2020-01-01 NOTE — Progress Notes (Signed)
I have reviewed the results and agree with this plan   

## 2020-01-01 NOTE — Patient Instructions (Addendum)
Pre visit review using our clinic review tool, if applicable. No additional management support is needed unless otherwise documented below in the visit note.  Continue to take 1 tablet daily except take 1/2 tablet on Sundays.  Re-check in 6 weeks.

## 2020-01-08 DIAGNOSIS — N401 Enlarged prostate with lower urinary tract symptoms: Secondary | ICD-10-CM | POA: Diagnosis not present

## 2020-01-08 DIAGNOSIS — Z87440 Personal history of urinary (tract) infections: Secondary | ICD-10-CM | POA: Diagnosis not present

## 2020-01-08 DIAGNOSIS — N138 Other obstructive and reflux uropathy: Secondary | ICD-10-CM | POA: Diagnosis not present

## 2020-01-18 ENCOUNTER — Other Ambulatory Visit: Payer: Self-pay | Admitting: Family Medicine

## 2020-01-18 DIAGNOSIS — I10 Essential (primary) hypertension: Secondary | ICD-10-CM

## 2020-01-27 ENCOUNTER — Encounter: Payer: Self-pay | Admitting: Family Medicine

## 2020-01-27 DIAGNOSIS — R338 Other retention of urine: Secondary | ICD-10-CM | POA: Diagnosis not present

## 2020-01-27 DIAGNOSIS — N138 Other obstructive and reflux uropathy: Secondary | ICD-10-CM | POA: Diagnosis not present

## 2020-01-27 DIAGNOSIS — N401 Enlarged prostate with lower urinary tract symptoms: Secondary | ICD-10-CM | POA: Diagnosis not present

## 2020-01-28 NOTE — Telephone Encounter (Signed)
Please advise 

## 2020-02-11 DIAGNOSIS — M51379 Other intervertebral disc degeneration, lumbosacral region without mention of lumbar back pain or lower extremity pain: Secondary | ICD-10-CM | POA: Insufficient documentation

## 2020-02-11 DIAGNOSIS — N401 Enlarged prostate with lower urinary tract symptoms: Secondary | ICD-10-CM | POA: Diagnosis not present

## 2020-02-11 DIAGNOSIS — Z01812 Encounter for preprocedural laboratory examination: Secondary | ICD-10-CM | POA: Diagnosis not present

## 2020-02-12 ENCOUNTER — Other Ambulatory Visit: Payer: Self-pay | Admitting: Family Medicine

## 2020-02-16 ENCOUNTER — Observation Stay (HOSPITAL_COMMUNITY): Payer: Medicare HMO

## 2020-02-16 ENCOUNTER — Observation Stay (HOSPITAL_COMMUNITY)
Admission: EM | Admit: 2020-02-16 | Discharge: 2020-02-17 | Disposition: A | Discharge status: Home / Self Care | Payer: Medicare HMO | Attending: Family Medicine | Admitting: Family Medicine

## 2020-02-16 ENCOUNTER — Emergency Department (HOSPITAL_COMMUNITY): Payer: Medicare HMO

## 2020-02-16 ENCOUNTER — Encounter (HOSPITAL_COMMUNITY): Payer: Self-pay | Admitting: Emergency Medicine

## 2020-02-16 DIAGNOSIS — R351 Nocturia: Secondary | ICD-10-CM

## 2020-02-16 DIAGNOSIS — N401 Enlarged prostate with lower urinary tract symptoms: Secondary | ICD-10-CM

## 2020-02-16 DIAGNOSIS — R531 Weakness: Secondary | ICD-10-CM | POA: Diagnosis not present

## 2020-02-16 DIAGNOSIS — R0902 Hypoxemia: Secondary | ICD-10-CM | POA: Diagnosis not present

## 2020-02-16 DIAGNOSIS — R29810 Facial weakness: Secondary | ICD-10-CM | POA: Diagnosis not present

## 2020-02-16 DIAGNOSIS — R404 Transient alteration of awareness: Secondary | ICD-10-CM | POA: Diagnosis not present

## 2020-02-16 DIAGNOSIS — Z8673 Personal history of transient ischemic attack (TIA), and cerebral infarction without residual deficits: Secondary | ICD-10-CM | POA: Diagnosis present

## 2020-02-16 DIAGNOSIS — Z7901 Long term (current) use of anticoagulants: Secondary | ICD-10-CM

## 2020-02-16 DIAGNOSIS — G459 Transient cerebral ischemic attack, unspecified: Secondary | ICD-10-CM | POA: Diagnosis not present

## 2020-02-16 DIAGNOSIS — R4701 Aphasia: Secondary | ICD-10-CM | POA: Diagnosis present

## 2020-02-16 DIAGNOSIS — R2681 Unsteadiness on feet: Secondary | ICD-10-CM | POA: Insufficient documentation

## 2020-02-16 DIAGNOSIS — I4891 Unspecified atrial fibrillation: Secondary | ICD-10-CM | POA: Diagnosis not present

## 2020-02-16 DIAGNOSIS — I6310 Cerebral infarction due to embolism of unspecified precerebral artery: Secondary | ICD-10-CM

## 2020-02-16 DIAGNOSIS — Z9861 Coronary angioplasty status: Secondary | ICD-10-CM | POA: Diagnosis not present

## 2020-02-16 DIAGNOSIS — I700 Atherosclerosis of aorta: Secondary | ICD-10-CM | POA: Diagnosis not present

## 2020-02-16 DIAGNOSIS — I2510 Atherosclerotic heart disease of native coronary artery without angina pectoris: Secondary | ICD-10-CM | POA: Diagnosis not present

## 2020-02-16 DIAGNOSIS — Z86718 Personal history of other venous thrombosis and embolism: Secondary | ICD-10-CM

## 2020-02-16 DIAGNOSIS — I6340 Cerebral infarction due to embolism of unspecified cerebral artery: Secondary | ICD-10-CM | POA: Diagnosis not present

## 2020-02-16 DIAGNOSIS — Z79899 Other long term (current) drug therapy: Secondary | ICD-10-CM | POA: Insufficient documentation

## 2020-02-16 DIAGNOSIS — I639 Cerebral infarction, unspecified: Secondary | ICD-10-CM | POA: Diagnosis not present

## 2020-02-16 DIAGNOSIS — Z20822 Contact with and (suspected) exposure to covid-19: Secondary | ICD-10-CM | POA: Insufficient documentation

## 2020-02-16 DIAGNOSIS — I10 Essential (primary) hypertension: Secondary | ICD-10-CM

## 2020-02-16 DIAGNOSIS — E785 Hyperlipidemia, unspecified: Secondary | ICD-10-CM

## 2020-02-16 DIAGNOSIS — R4781 Slurred speech: Secondary | ICD-10-CM | POA: Diagnosis not present

## 2020-02-16 DIAGNOSIS — Z87891 Personal history of nicotine dependence: Secondary | ICD-10-CM | POA: Diagnosis not present

## 2020-02-16 DIAGNOSIS — N138 Other obstructive and reflux uropathy: Secondary | ICD-10-CM | POA: Diagnosis present

## 2020-02-16 LAB — DIFFERENTIAL
Abs Immature Granulocytes: 0.04 10*3/uL (ref 0.00–0.07)
Basophils Absolute: 0.1 10*3/uL (ref 0.0–0.1)
Basophils Relative: 1 %
Eosinophils Absolute: 0 10*3/uL (ref 0.0–0.5)
Eosinophils Relative: 1 %
Immature Granulocytes: 1 %
Lymphocytes Relative: 22 %
Lymphs Abs: 1.4 10*3/uL (ref 0.7–4.0)
Monocytes Absolute: 0.7 10*3/uL (ref 0.1–1.0)
Monocytes Relative: 10 %
Neutro Abs: 4.4 10*3/uL (ref 1.7–7.7)
Neutrophils Relative %: 65 %

## 2020-02-16 LAB — COMPREHENSIVE METABOLIC PANEL
ALT: 23 U/L (ref 0–44)
AST: 32 U/L (ref 15–41)
Albumin: 3.8 g/dL (ref 3.5–5.0)
Alkaline Phosphatase: 53 U/L (ref 38–126)
Anion gap: 8 (ref 5–15)
BUN: 13 mg/dL (ref 8–23)
CO2: 26 mmol/L (ref 22–32)
Calcium: 9.7 mg/dL (ref 8.9–10.3)
Chloride: 104 mmol/L (ref 98–111)
Creatinine, Ser: 0.85 mg/dL (ref 0.61–1.24)
GFR, Estimated: >60 mL/min (ref >60)
Glucose, Bld: 120 mg/dL — ABNORMAL HIGH (ref 70–99)
Potassium: 4.4 mmol/L (ref 3.5–5.1)
Sodium: 138 mmol/L (ref 135–145)
Total Bilirubin: 1.4 mg/dL — ABNORMAL HIGH (ref 0.3–1.2)
Total Protein: 6.6 g/dL (ref 6.5–8.1)

## 2020-02-16 LAB — CBC
HCT: 48.9 % (ref 39.0–52.0)
Hemoglobin: 16.2 g/dL (ref 13.0–17.0)
MCH: 29.8 pg (ref 26.0–34.0)
MCHC: 33.1 g/dL (ref 30.0–36.0)
MCV: 90.1 fL (ref 80.0–100.0)
Platelets: 192 10*3/uL (ref 150–400)
RBC: 5.43 MIL/uL (ref 4.22–5.81)
RDW: 14 % (ref 11.5–15.5)
WBC: 6.6 10*3/uL (ref 4.0–10.5)
nRBC: 0 % (ref 0.0–0.2)

## 2020-02-16 LAB — CBG MONITORING, ED: Glucose-Capillary: 119 mg/dL — ABNORMAL HIGH (ref 70–99)

## 2020-02-16 LAB — I-STAT CHEM 8, ED
BUN: 18 mg/dL (ref 8–23)
Calcium, Ion: 1.25 mmol/L (ref 1.15–1.40)
Chloride: 102 mmol/L (ref 98–111)
Creatinine, Ser: 0.7 mg/dL (ref 0.61–1.24)
Glucose, Bld: 114 mg/dL — ABNORMAL HIGH (ref 70–99)
HCT: 50 % (ref 39.0–52.0)
Hemoglobin: 17 g/dL (ref 13.0–17.0)
Potassium: 4.4 mmol/L (ref 3.5–5.1)
Sodium: 140 mmol/L (ref 135–145)
TCO2: 29 mmol/L (ref 22–32)

## 2020-02-16 LAB — APTT: aPTT: 27 seconds (ref 24–36)

## 2020-02-16 LAB — PROTIME-INR
INR: 1 (ref 0.8–1.2)
Prothrombin Time: 13.2 seconds (ref 11.4–15.2)

## 2020-02-16 IMAGING — CT CT HEAD W/O CM
4 series · 15 of 47 positions shown, 17 images · non-contrast
Comparison: None.

CLINICAL DATA: Transient ischemic attack. Expressive aphasia, left
leg weakness, resolved prior to arrival.

EXAM:
CT HEAD WITHOUT CONTRAST
TECHNIQUE: Contiguous axial images were obtained from the base of the skull
through the vertex without intravenous contrast.

[Series 3: head wo · axial · 0.50mm/px · z∈[-72,+48]mm · 7 of 33 slices shown, 9 images]
[im 5/33  brain]
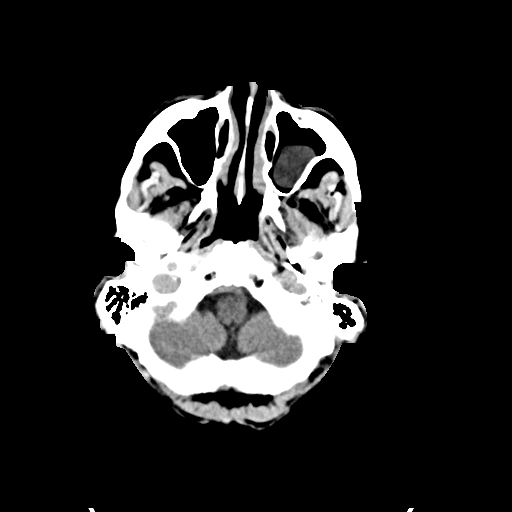
[im 5/33  bone]
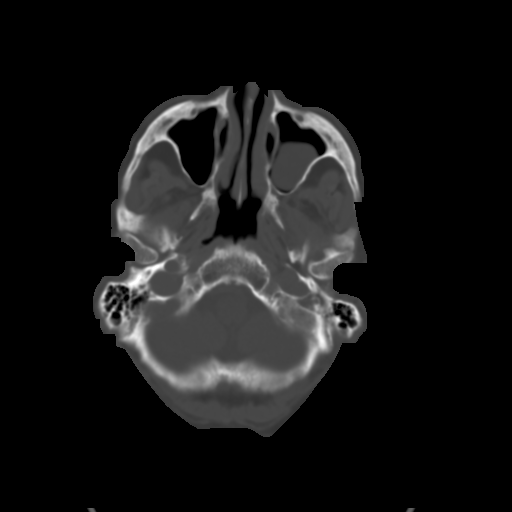
[im 9/33  brain]
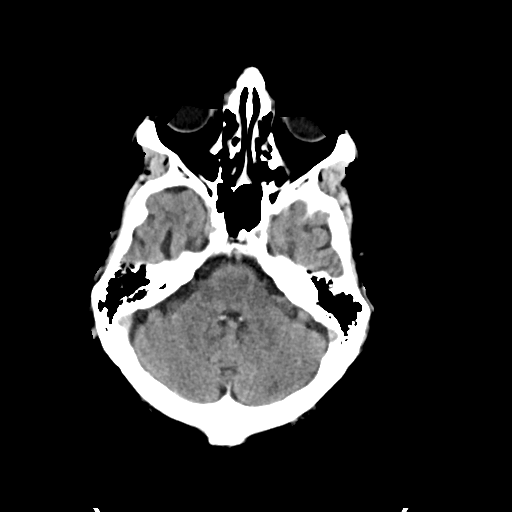
[im 13/33  brain]
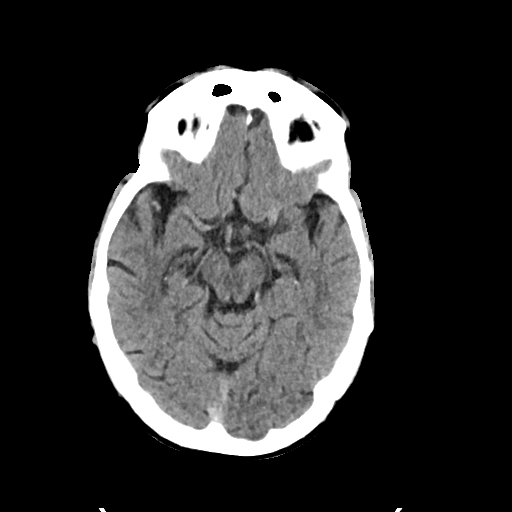
[im 17/33  brain]
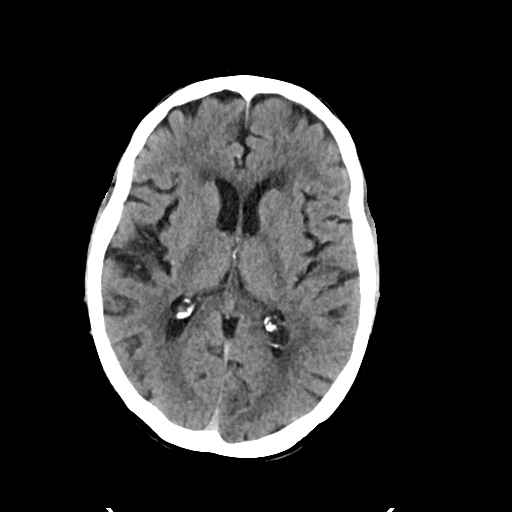
[im 21/33  brain]
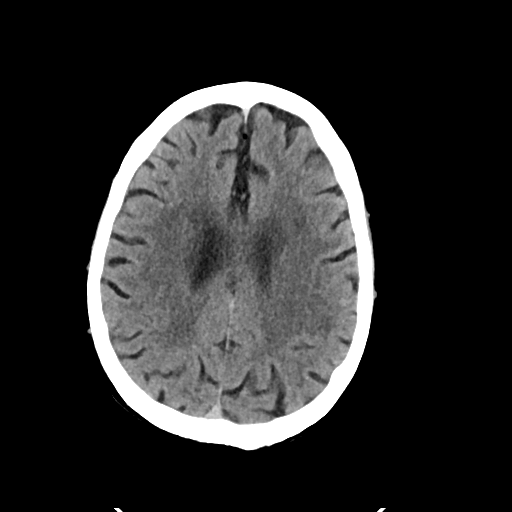
[im 21/33  bone]
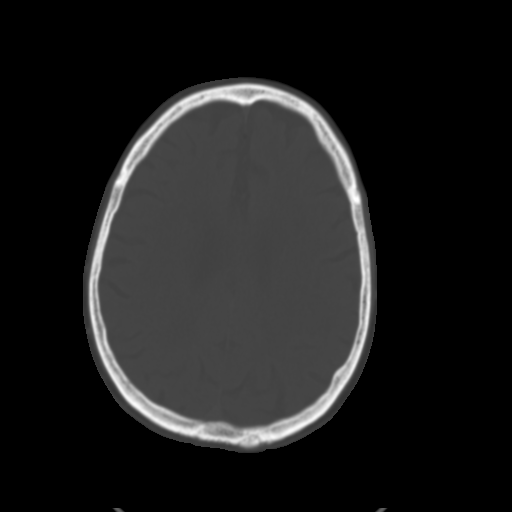
[im 25/33  brain]
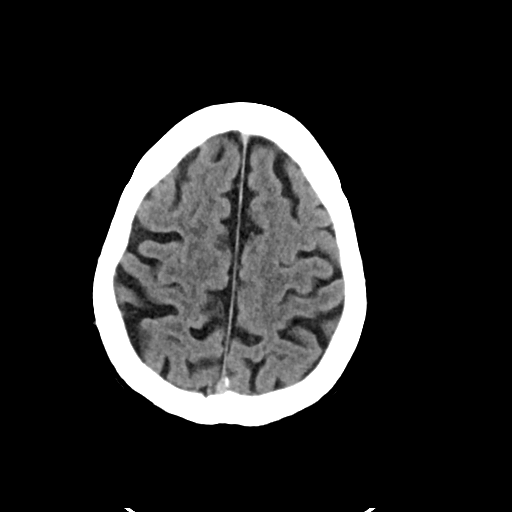
[im 29/33  brain]
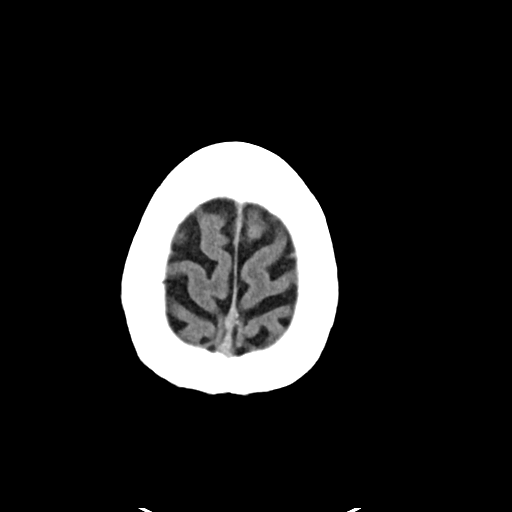

[Series 4: head bone · axial · 0.50mm/px · z∈[-76,-60]mm · 2 of 82 slices shown]
[im 9/82  bone]
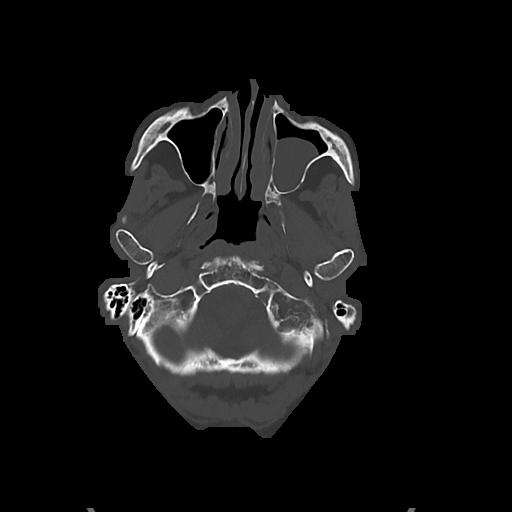
[im 17/82  bone]
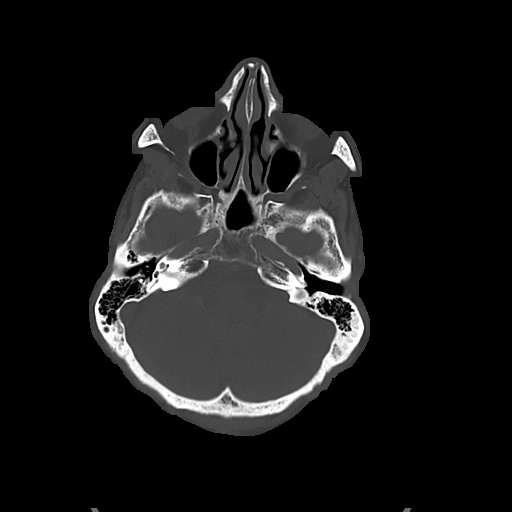

[Series 5: cor soft · coronal · 0.31mm/px · 3 of 88 slices shown]
[im 30/88  brain]
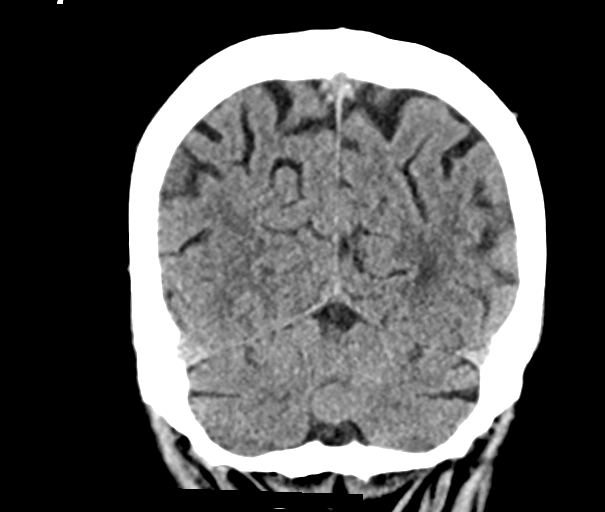
[im 39/88  brain]
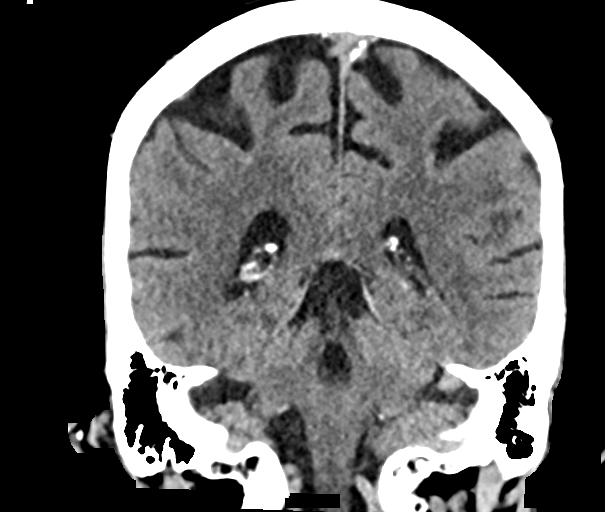
[im 49/88  brain]
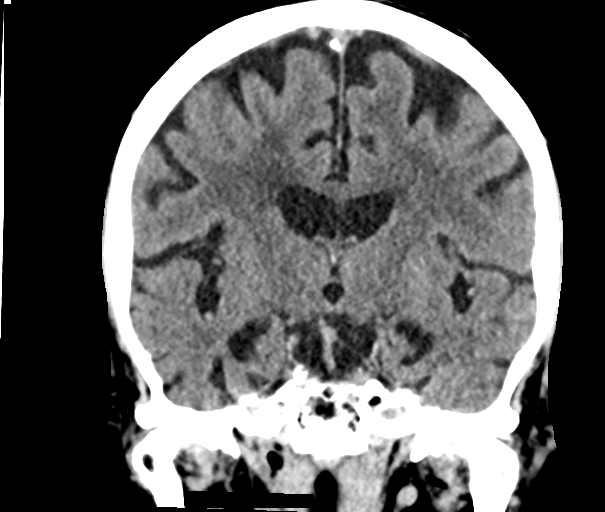

[Series 6: sag soft · sagittal · 0.31mm/px · 3 of 61 slices shown]
[im 21/61  brain]
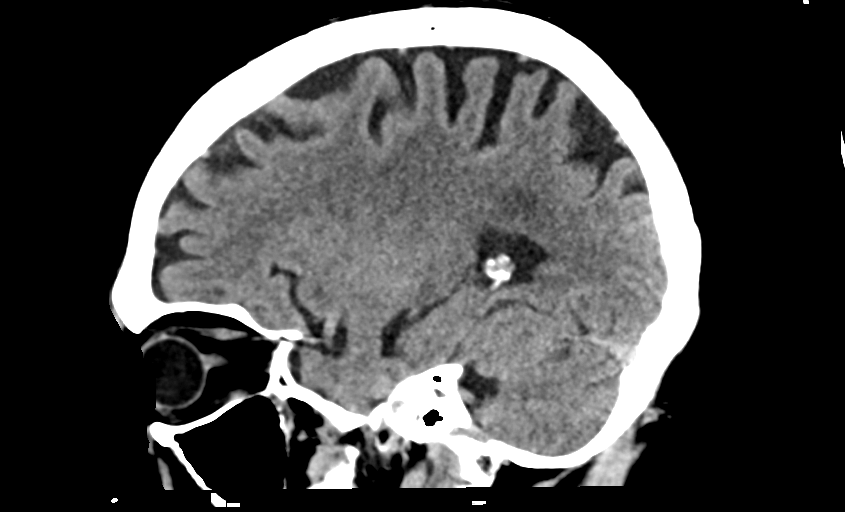
[im 31/61  brain]
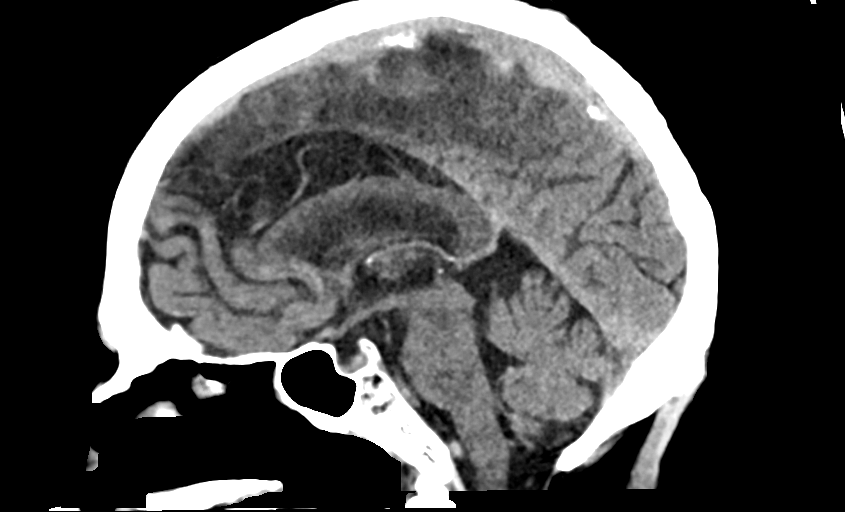
[im 41/61  brain]
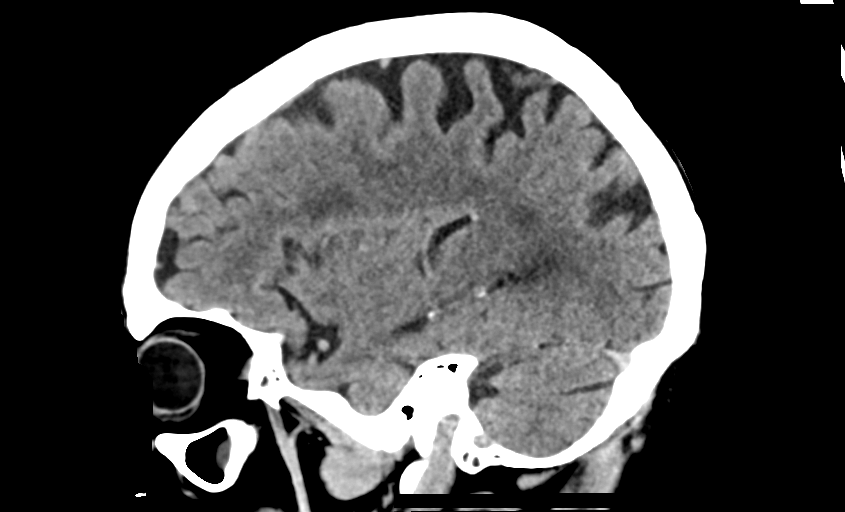

[15 of 47 positions shown; findings below may reference images not displayed]

FINDINGS: Brain: Diffuse cerebral atrophy. Mild ventricular dilatation
consistent with central atrophy. Patchy low-attenuation changes
throughout the deep white matter most consistent with small vessel
ischemia. No mass-effect or midline shift. No abnormal extra-axial
fluid collections. Gray-white matter junctions are distinct. Basal
cisterns are not effaced. No acute intracranial hemorrhage.

Vascular: No hyperdense vessel or unexpected calcification.

Skull: Calvarium appears intact.

Sinuses/Orbits: Retention cyst in the left maxillary antrum.
Paranasal sinuses and mastoid air cells are otherwise clear.

Other: None.
IMPRESSION: 1. No acute intracranial abnormalities.
2. Chronic atrophy and small vessel ischemia.

## 2020-02-16 IMAGING — CT CT ANGIO NECK
2 of 8 series · 8 of 33 positions shown · IV contrast (OMNI 350)
Comparison: None.

CLINICAL DATA: Stroke/TIA

EXAM:
CT ANGIOGRAPHY HEAD AND NECK
TECHNIQUE: Multidetector CT imaging of the head and neck was performed using
the standard protocol during bolus administration of intravenous
contrast. Multiplanar CT image reconstructions and MIPs were
obtained to evaluate the vascular anatomy. Carotid stenosis
measurements (when applicable) are obtained utilizing NASCET
criteria, using the distal internal carotid diameter as the
denominator.
CONTRAST:  75mL OMNIPAQUE IOHEXOL 350 MG/ML SOLN

[Series 5: cta neck · axial · 0.47mm/px · z∈[-243,-109]mm · 2 of 201 slices shown]
[im 67/201  soft-tissue]
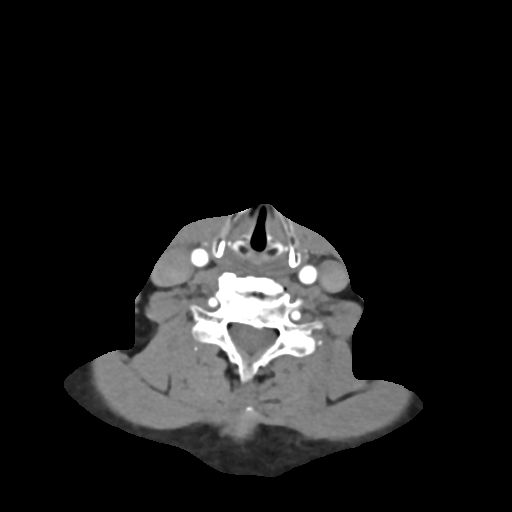
[im 134/201  soft-tissue]
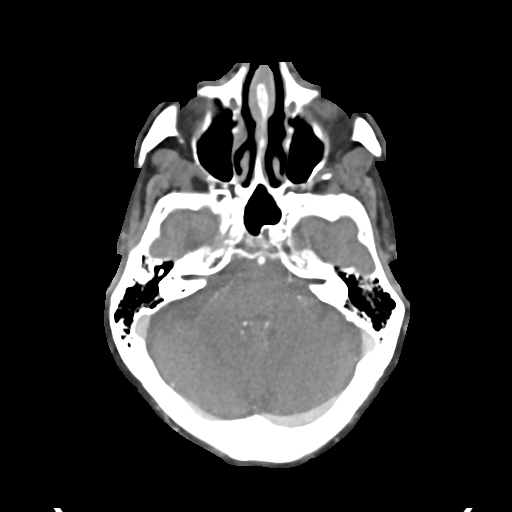

[Series 7: cta neck axial · axial · 0.39mm/px · z∈[-317,-32]mm · 6 of 401 slices shown]
[im 58/401  soft-tissue]
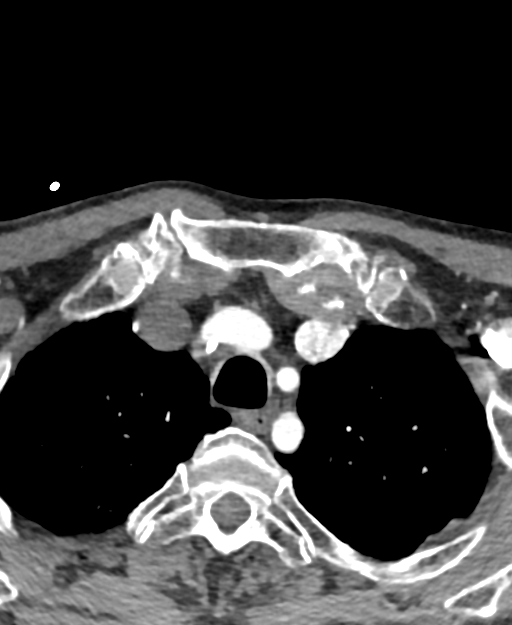
[im 115/401  bone]
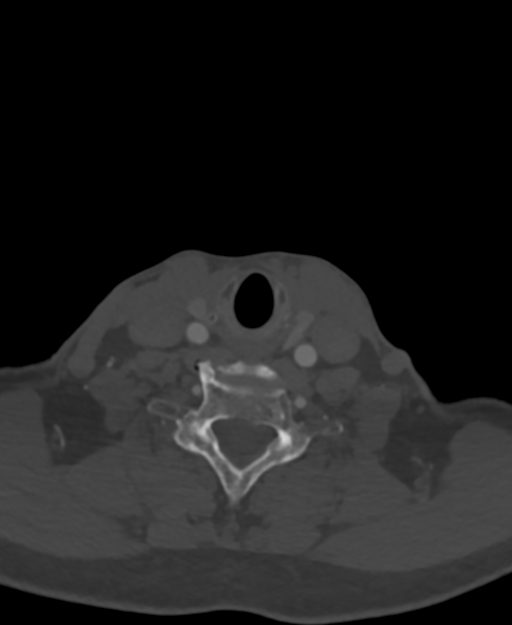
[im 172/401  soft-tissue]
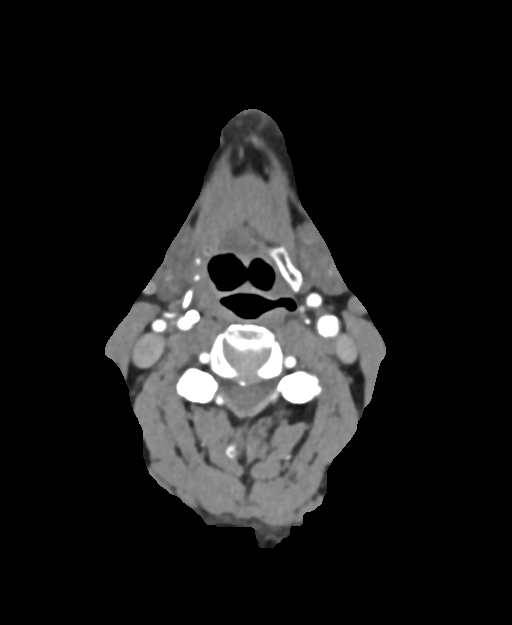
[im 229/401  bone]
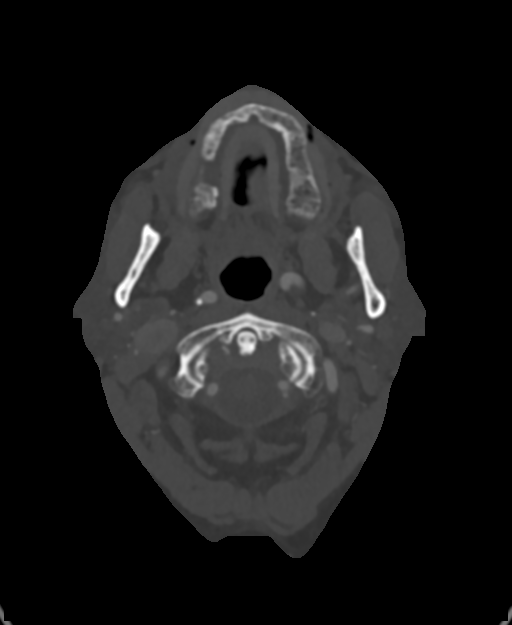
[im 286/401  soft-tissue]
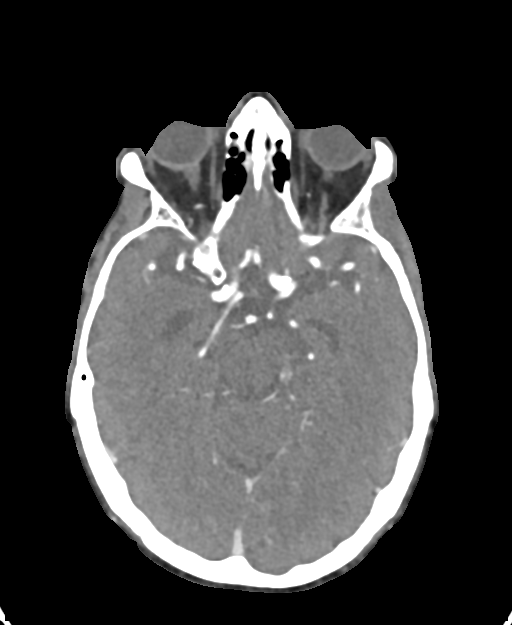
[im 343/401  bone]
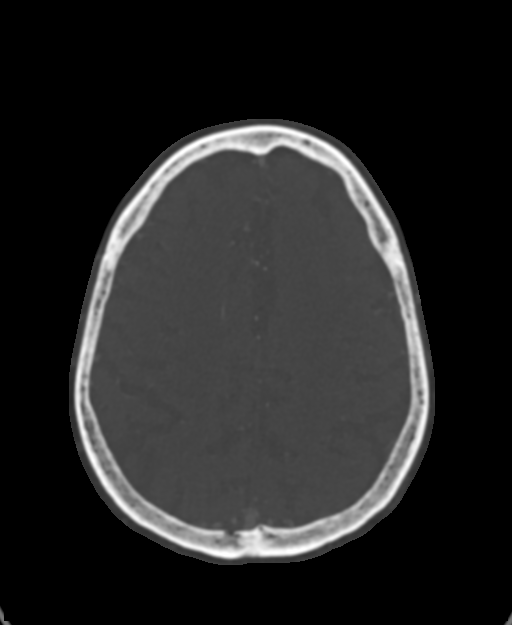

[8 of 33 positions shown; findings below may reference images not displayed]

FINDINGS: CTA NECK FINDINGS

SKELETON: There is no bony spinal canal stenosis. No lytic or
blastic lesion.

OTHER NECK: Normal pharynx, larynx and major salivary glands. No
cervical lymphadenopathy. Unremarkable thyroid gland.

UPPER CHEST: Biapical emphysema.

AORTIC ARCH:

There is calcific atherosclerosis of the aortic arch. There is no
aneurysm, dissection or hemodynamically significant stenosis of the
visualized portion of the aorta. Conventional 3 vessel aortic
branching pattern. The visualized proximal subclavian arteries are
widely patent.

RIGHT CAROTID SYSTEM: No dissection, occlusion or aneurysm. Mild
atherosclerotic calcification at the carotid bifurcation without
hemodynamically significant stenosis.

LEFT CAROTID SYSTEM: No dissection, occlusion or aneurysm. Mild
atherosclerotic calcification at the carotid bifurcation without
hemodynamically significant stenosis.

VERTEBRAL ARTERIES: Codominant configuration. Both vertebral artery
origins are normal. There is no dissection, occlusion or
flow-limiting stenosis to the skull base (V1-V3 segments).

CTA HEAD FINDINGS

POSTERIOR CIRCULATION:

--Vertebral arteries: Normal V4 segments.

--Inferior cerebellar arteries: Normal.

--Basilar artery: Normal.

--Superior cerebellar arteries: Normal.

--Posterior cerebral arteries (PCA): Normal.

ANTERIOR CIRCULATION:

--Intracranial internal carotid arteries: Normal.

--Anterior cerebral arteries (ACA): Normal. Both A1 segments are
present. Patent anterior communicating artery (a-comm).

--Middle cerebral arteries (MCA): Normal.

VENOUS SINUSES: As permitted by contrast timing, patent.

ANATOMIC VARIANTS: Fetal origin of the left posterior cerebral
artery.

Review of the MIP images confirms the above findings.
IMPRESSION: 1. No emergent large vessel occlusion or hemodynamically significant
stenosis of the head or neck.

Aortic Atherosclerosis ([MI]-[MI]) and Emphysema ([MI]-[MI]).

## 2020-02-16 IMAGING — MR MR HEAD W/O CM
12 of 13 series · 44 of 48 positions shown · non-contrast
Comparison: CT head [DATE]

CLINICAL DATA: Acute neuro deficit. Expressive aphasia left leg
weakness

EXAM:
MRI HEAD WITHOUT CONTRAST
TECHNIQUE: Multiplanar, multiecho pulse sequences of the brain and surrounding
structures were obtained without intravenous contrast.

[Series 9: DWI · axial · 3.0mm · 0.88mm/px · z∈[-58,+88]mm · 8 of 100 slices shown (1 of 4)]
[im 1/100]
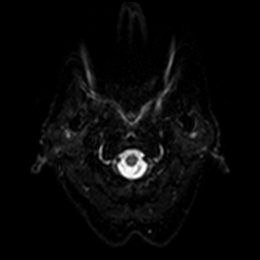
[im 15/100]
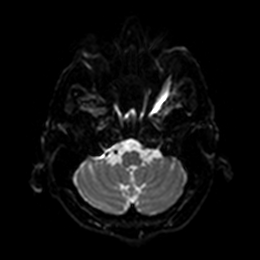
[im 29/100]
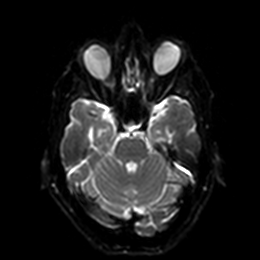
[im 43/100]
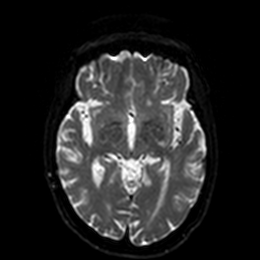
[im 57/100]
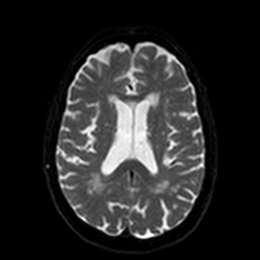
[im 71/100]
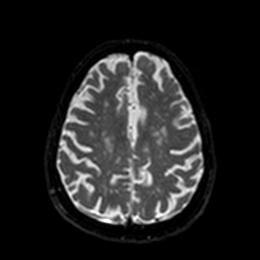
[im 85/100]
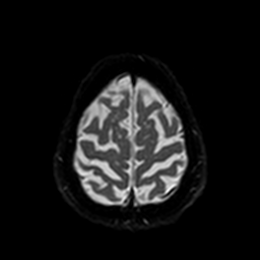
[im 100/100]
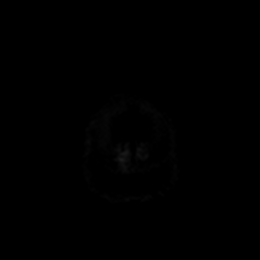

[Series 10: DWI · axial · 3.0mm · 0.88mm/px · z∈[-58,+88]mm · 4 of 50 slices shown (2 of 4)]
[im 1/50]
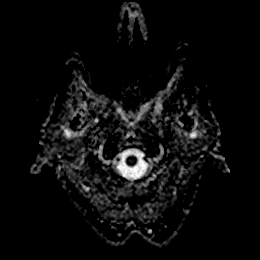
[im 17/50]
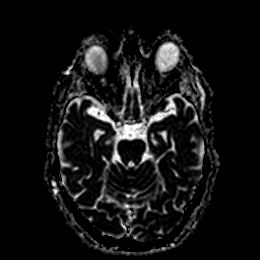
[im 33/50]
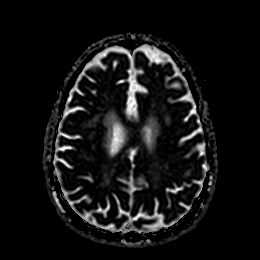
[im 50/50]
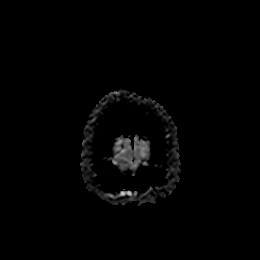

[Series 11: DWI · coronal · 4.0mm · 0.88mm/px · 5 of 68 slices shown (3 of 4)]
[im 1/68]
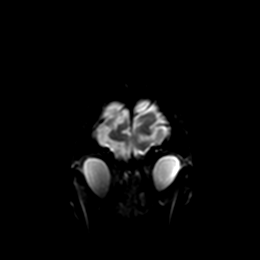
[im 17/68]
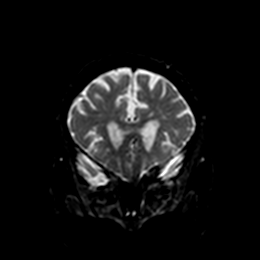
[im 34/68]
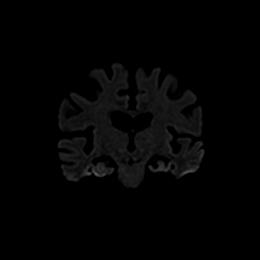
[im 51/68]
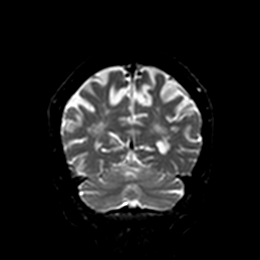
[im 68/68]
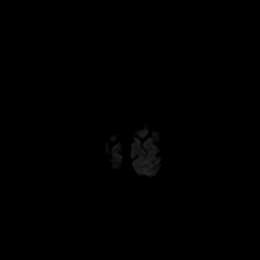

[Series 12: DWI · coronal · 4.0mm · 0.88mm/px · 3 of 34 slices shown (4 of 4)]
[im 1/34]
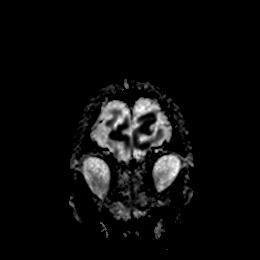
[im 17/34]
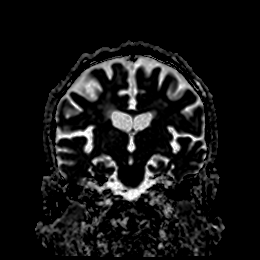
[im 34/34]
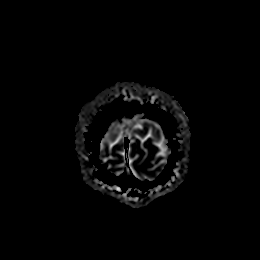

[Series 13: T1 · sagittal · 5.0mm · 0.75mm/px · 2 of 25 slices shown]
[im 1/25]
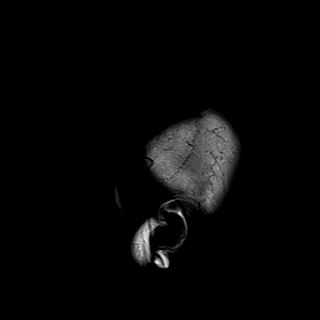
[im 25/25]
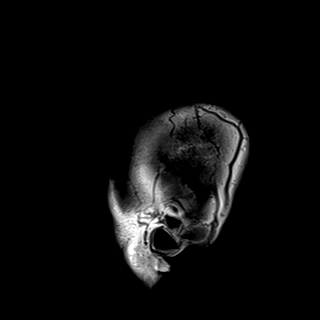

[Series 14: T2 · axial · 5.0mm · 0.72mm/px · z∈[-57,+86]mm · 2 of 25 slices shown (1 of 2)]
[im 1/25]
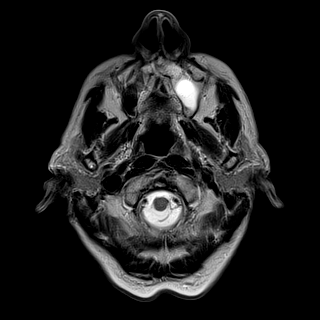
[im 25/25]
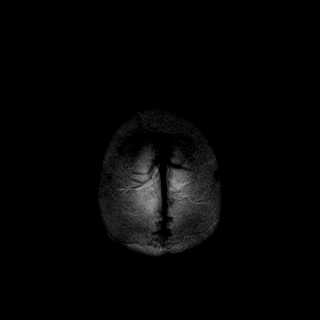

[Series 15: FLAIR · axial · 5.0mm · 0.45mm/px · z∈[-59,+84]mm · 2 of 25 slices shown]
[im 1/25]
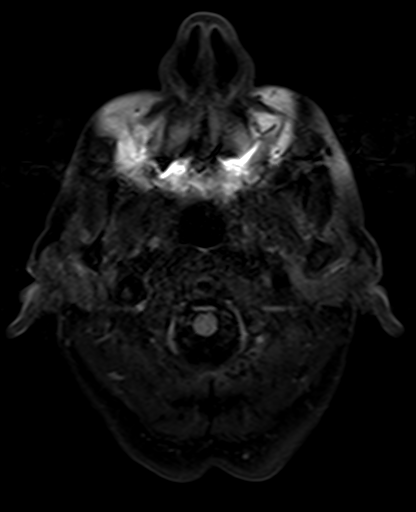
[im 25/25]
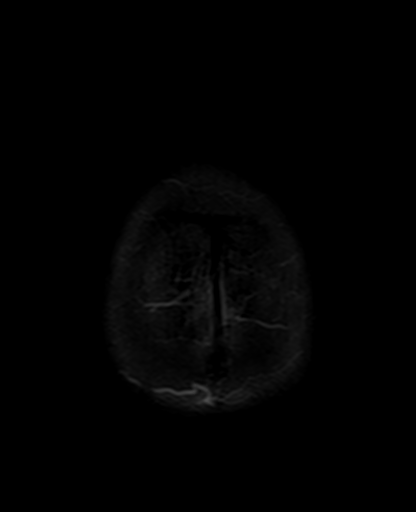

[Series 16: mag_images · axial · 3.0mm · 0.90mm/px · z∈[-75,+101]mm · 4 of 60 slices shown]
[im 1/60]
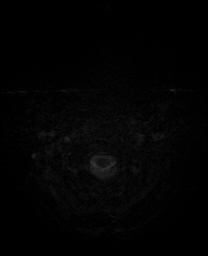
[im 20/60]
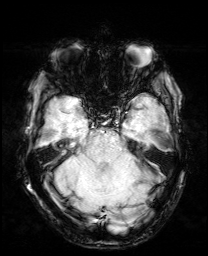
[im 40/60]
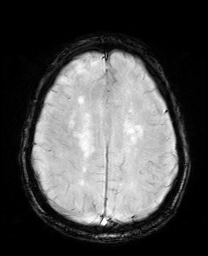
[im 60/60]
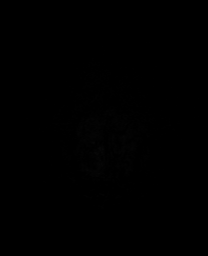

[Series 17: pha_images · axial · 3.0mm · 0.90mm/px · z∈[-75,+98]mm · 4 of 59 slices shown]
[im 1/59]
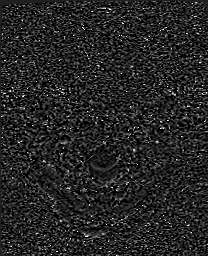
[im 20/59]
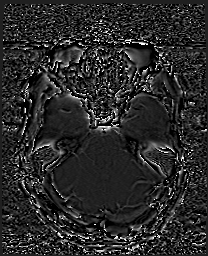
[im 39/59]
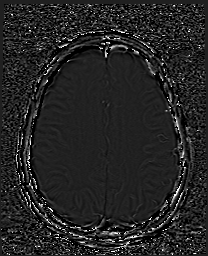
[im 59/59]
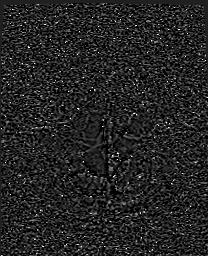

[Series 18: swi_images · axial · 3.0mm · 0.90mm/px · z∈[-75,+101]mm · 4 of 60 slices shown]
[im 1/60]
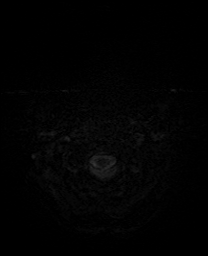
[im 20/60]
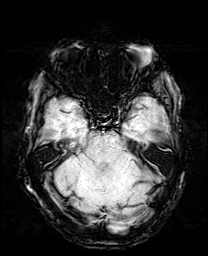
[im 40/60]
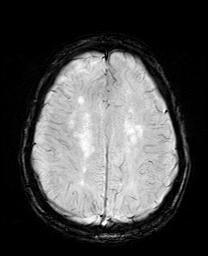
[im 60/60]
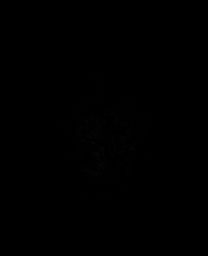

[Series 19: mip_images(sw) · axial · 24.0mm · 0.90mm/px · z∈[-64,+90]mm · 4 of 53 slices shown]
[im 1/53]
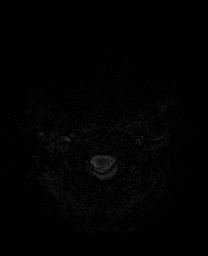
[im 18/53]
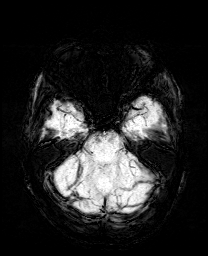
[im 35/53]
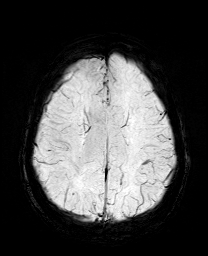
[im 53/53]
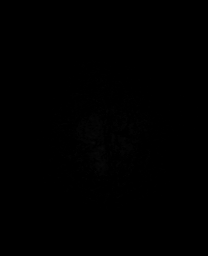

[Series 21: T2 · coronal · 5.0mm · 0.34mm/px · 2 of 29 slices shown (2 of 2)]
[im 1/29]
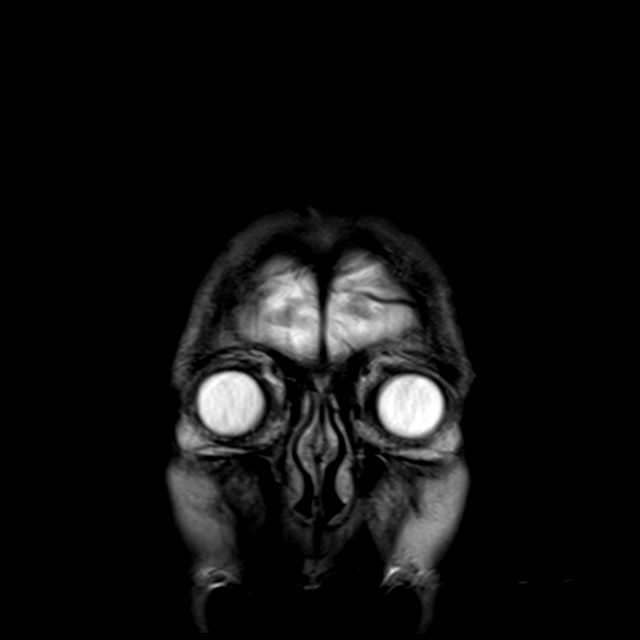
[im 29/29]
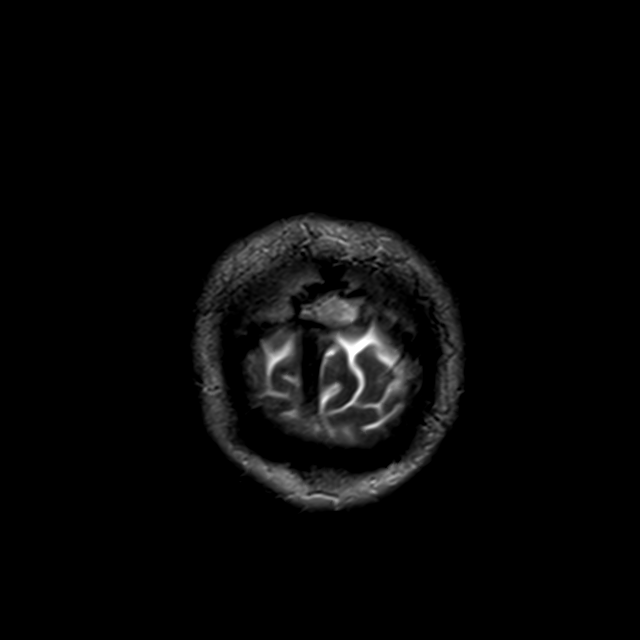

[44 of 48 positions shown; findings below may reference images not displayed]

FINDINGS: Brain: Acute infarct in the left posterior insula and parietal
operculum. This measures approximately 15 x 25 mm. No other areas of
acute infarct. Negative for hemorrhage or mass.

Scattered white matter hyperintensities compatible with chronic
microvascular ischemia, moderate in degree. Negative for mass
lesion.

Vascular: Normal arterial flow voids

Skull and upper cervical spine: No focal skeletal lesion.

Sinuses/Orbits: Cyst left maxillary sinus. Mild mucosal edema in the
paranasal sinuses. Bilateral cataract extraction.

Other: None
IMPRESSION: Acute infarct left posterior insula and parietal operculum without
hemorrhage

Moderate chronic microvascular ischemic change in the white matter.

## 2020-02-16 MED ORDER — HEPARIN (PORCINE) 25000 UT/250ML-% IV SOLN
1450.0000 [IU]/h | INTRAVENOUS | Status: DC
  Administered 2020-02-16: 1450 [IU]/h via INTRAVENOUS
  Filled 2020-02-16: qty 250

## 2020-02-16 MED ORDER — ACETAMINOPHEN 650 MG RE SUPP: 650.0000 mg | RECTAL | Status: DC | PRN

## 2020-02-16 MED ORDER — SODIUM CHLORIDE 0.9% FLUSH
3.0000 mL | Freq: Once | INTRAVENOUS | Status: AC
  Administered 2020-02-16: 3 mL via INTRAVENOUS

## 2020-02-16 MED ORDER — HEPARIN BOLUS VIA INFUSION
5800.0000 [IU] | Freq: Once | INTRAVENOUS | Status: DC
  Filled 2020-02-16: qty 5800

## 2020-02-16 MED ORDER — SENNOSIDES-DOCUSATE SODIUM 8.6-50 MG PO TABS: 1.0000 | ORAL_TABLET | Freq: Every evening | ORAL | Status: DC | PRN

## 2020-02-16 MED ORDER — TAMSULOSIN HCL 0.4 MG PO CAPS: 0.4000 mg | ORAL_CAPSULE | Freq: Every day | ORAL | Status: DC

## 2020-02-16 MED ORDER — TAMSULOSIN HCL 0.4 MG PO CAPS: 0.8000 mg | ORAL_CAPSULE | Freq: Every day | ORAL | Status: DC

## 2020-02-16 MED ORDER — ACETAMINOPHEN 160 MG/5ML PO SOLN: 650.0000 mg | ORAL | Status: DC | PRN

## 2020-02-16 MED ORDER — TAMSULOSIN HCL 0.4 MG PO CAPS
0.8000 mg | ORAL_CAPSULE | Freq: Every day | ORAL | Status: DC
  Administered 2020-02-16: 0.8 mg via ORAL
  Filled 2020-02-16: qty 2

## 2020-02-16 MED ORDER — ACETAMINOPHEN 325 MG PO TABS
650.0000 mg | ORAL_TABLET | ORAL | Status: DC | PRN
  Administered 2020-02-17: 650 mg via ORAL
  Filled 2020-02-16: qty 2

## 2020-02-16 MED ORDER — ATORVASTATIN CALCIUM 40 MG PO TABS
40.0000 mg | ORAL_TABLET | Freq: Every day | ORAL | Status: DC
  Administered 2020-02-16 – 2020-02-17 (×2): 40 mg via ORAL
  Filled 2020-02-16: qty 4
  Filled 2020-02-16: qty 1

## 2020-02-16 MED ORDER — IOHEXOL 350 MG/ML SOLN
75.0000 mL | Freq: Once | INTRAVENOUS | Status: AC | PRN
  Administered 2020-02-16: 75 mL via INTRAVENOUS

## 2020-02-16 MED ORDER — LABETALOL HCL 5 MG/ML IV SOLN: 5.0000 mg | INTRAVENOUS | Status: AC | PRN

## 2020-02-16 MED ORDER — STROKE: EARLY STAGES OF RECOVERY BOOK
Freq: Once | Status: DC
  Filled 2020-02-16: qty 1

## 2020-02-16 MED ORDER — ATORVASTATIN CALCIUM 10 MG PO TABS: 20.0000 mg | ORAL_TABLET | Freq: Every day | ORAL | Status: DC

## 2020-02-16 NOTE — ED Triage Notes (Signed)
Patient BIB [facility] from home with complaint of expressive aphasia, left leg weakness that resolved prior to arrival to ED. LKW 2330 yesterday. 18g saline lock in left AC.

## 2020-02-16 NOTE — ED Notes (Signed)
pts wife, Fannie Knee, remembered that patient had talked to his sister.  Fannie Knee called sister and this nurse talked with the sister.  Sister states she had missed call from pt @ 1110, she returned his call at 28 and they had discussion.  Sister states patient had normal speech throughout their conversation.

## 2020-02-16 NOTE — ED Notes (Signed)
Pt reports that he got up around noon, made bed, made himself breakfast and peeled an apple.  Pt reports that he did not notice a facial droop when he got up and looked in the mirror.  He denies he had any weakness.  Per wife, who is at bedside, granddaughter told her that she heard patient choking, she went to kitchen and seen pt was holding a bowl of apples, and granddaughter noticed a facial droop and that patient was unable to get words out correctly.

## 2020-02-16 NOTE — ED Notes (Signed)
Patient granddaughter Delfino Lovett [phone]

## 2020-02-16 NOTE — Consult Note (Addendum)
NEURO HOSPITALIST  CONSULT   Requesting Physician: Dr. Ashok Cordia    Chief Complaint: Difficulty expressing thoughts  History obtained from:  Patient and Family  HPI:                                                                                                                                         William Norman is an 80 y.o. male with a. Fib (on Coumadin), HTN, CAD, HLD, dyslipidemia, BPH and DVT who presented to the [facility] ED after sudden onset of trouble speaking.   Per family, patient spoke to his sister about 11:24 am this morning and was normal at that time. Shortly after he was found in the kitchen unable to speak. His granddaughter found him because she thought she heard him choking. When she went into the kitchen he was holding a bowl of apples and was unable to speak coherently. He states that he could understand what people were saying but was unable to respond. However, his wife states that in a few instances he seemed to be unable to understand what she was saying. He says symptoms lasted about an hour, but family states about 20 minutes for the severe aphasia symptoms. There was still some mild confusion noticed by his wife while he was in the ED. His symptoms are now completely subjectively resolved, but he did miss "hammock" when naming pictures on the NIHSS card. Of note, he is usually on Coumadin, but it has been held for several days in anticipation of TURP Tuesday 02/18/20. Patient denies HA, CP, SOB, numbness, tingling or any weakness. Last known normal: 02/16/2020 at 11:24 am. No prior stroke history.   ED course:  CTH: negative for hemorrhage BG: 120 Creatinine: 0.70 tPA Given: No: Symptoms resolved   Modified Rankin: Rankin Score=1 NIHSS: 0   Past Medical History:  Diagnosis Date  . BPH (benign prostatic hypertrophy)   . BPH associated with nocturia 08/29/2013  . BPH with obstruction/lower urinary tract symptoms  08/14/2019  . CAD (coronary artery disease)   . CAD (coronary artery disease), native coronary artery    PTCA of RCA 1986, PTCA of LAD 1992,  Negative treadmill Cardiolite 2011   . Chronic dermatitis of hands 10/12/2015  . Chronic venous insufficiency   . Coagulation disorder ([place]) 10/26/2018  . DVT (deep venous thrombosis) ([place])   . Dyslipidemia   . Eczema   . Erectile dysfunction 07/11/2012  . Essential hypertension   . GERD 08/04/2006      . GERD (gastroesophageal reflux disease)   . Hyperglycemia 07/08/2019  . Hyperlipidemia   . Hypertension   . Incomplete emptying  of bladder 08/14/2019  . Long term current use of anticoagulant therapy   . Lumbar disc disease   . Overweight (BMI 25.0-29.9) 06/18/2015  . Persistent atrial fibrillation ([place]) 04/10/2018  . Personal history of DVT (deep vein thrombosis)    Initially in 1998 with recurrence in 2002 now on chronic warfarin   . Soft tissue lesion of foot 08/27/2015  . Urinary tract infection with hematuria 08/14/2019    Past Surgical History:  Procedure Laterality Date  . CARDIAC CATHETERIZATION    . CARPAL TUNNEL RELEASE  04/21/2011   Procedure: CARPAL TUNNEL RELEASE;  Surgeon: Tennis Must, MD;  Location: [place];  Service: Orthopedics;  Laterality: Left;  . CARPAL TUNNEL RELEASE  05/23/2011   Procedure: CARPAL TUNNEL RELEASE;  Surgeon: Tennis Must, MD;  Location: [place];  Service: Orthopedics;  Laterality: Right;  . COLON SURGERY  2010 and 2011 colostomy reversal  . CORONARY ANGIOPLASTY    . EYE SURGERY     repair of macular hole  . NASAL SEPTUM SURGERY    . precutaneous transluminal coronary angioplasty    . right hernia    . TONSILLECTOMY AND ADENOIDECTOMY      Family History  Problem Relation Age of Onset  . Heart disease Father   . Heart attack Father   . Heart disease Mother   . AAA (abdominal aortic aneurysm) Brother          Social History:  reports that he quit smoking about 21  years ago. His smoking use included cigarettes. He has a 60.00 pack-year smoking history. He has never used smokeless tobacco. He reports current alcohol use of about 7.0 standard drinks of alcohol per week. He reports that he does not use drugs.  Allergies:  Allergies  Allergen Reactions  . Other Other (See Comments)    Some form of numbing medication used by dentist, almost died  . Simvastatin Other (See Comments)    Muscle aches     Medications:                                                                                                                           Current Facility-Administered Medications  Medication Dose Route Frequency Provider Last Rate Last Admin  . sodium chloride flush (NS) 0.9 % injection 3 mL  3 mL Intravenous Once Lajean Saver, MD       Current Outpatient Medications  Medication Sig Dispense Refill  . amLODipine (NORVASC) 5 MG tablet TAKE 1 TABLET BY MOUTH EVERY OTHER DAY 45 tablet 3  . atenolol (TENORMIN) 100 MG tablet TAKE 1 TABLET BY MOUTH EVERY DAY 90 tablet 1  . atorvastatin (LIPITOR) 40 MG tablet Take 0.5 tablets (20 mg total) by mouth daily. 90 tablet 1  . Coenzyme Q10 (COQ10) 200 MG CAPS Take 1 capsule by mouth daily.     . Cyanocobalamin (VITAMIN B-12) 1000 MCG SUBL Place 1 tablet under the tongue daily.    Marland Kitchen  hydroxypropyl methylcellulose / hypromellose (ISOPTO TEARS / GONIOVISC) 2.5 % ophthalmic solution Place 1 drop into both eyes 4 (four) times daily as needed for dry eyes.    Marland Kitchen lisinopril (ZESTRIL) 40 MG tablet TAKE 1 TABLET BY MOUTH EVERY DAY 90 tablet 0  . Melatonin ([employer] MELATONIN) 5 MG SUBL Place 1 tablet under the tongue at bedtime.    . nitroGLYCERIN (NITROSTAT) 0.4 MG SL tablet Dissolve 1 tablet under the tongue every 5 minutes as needed 90 tablet 1  . tamsulosin (FLOMAX) 0.4 MG CAPS capsule Take 0.4 mg by mouth daily.    Marland Kitchen warfarin (COUMADIN) 5 MG tablet TAKE 1 TABLET BY MOUTH DAILY OR AS DIRECTED BY ANTICOAGULATION CLINIC. 90 tablet 2     ROS:                                                                                                                                       ROS was performed and is negative except as noted in HPI   General Examination:                                                                                                      Blood pressure (!) 160/88, pulse 81, temperature 97.9 F (36.6 C), temperature source Oral, resp. rate 17, SpO2 97 %.  Physical Exam  Constitutional: Appears well-developed and well-nourished.  Psych: Affect appropriate to situation Eyes: Normal external eye and conjunctiva. HENT: Normocephalic, no lesions, without obvious abnormality.   Musculoskeletal-no joint tenderness, deformity or swelling Cardiovascular: Normal rate and regular rhythm.  Respiratory: Effort normal, non-labored breathing saturations WNL GI: Soft.  No distension. There is no tenderness.  Skin: WDI  Neurological Examination Mental Status: Alert, oriented, thought content appropriate.  Speech fluent.  Able to follow  commands without difficulty. Repetition intact. Naming intact except for inability to verbally identify "hammock" on the NIHSS card.  Cranial Nerves: II:  Visual fields grossly normal,  III,IV, VI: ptosis not present, extra-ocular motions intact bilaterally, pupils equal, round, reactive to light and accommodation V,VII: smile symmetric, facial light touch sensation normal bilaterally VIII: hearing normal bilaterally IX,X: uvula rises midline XI: bilateral shoulder shrug XII: midline tongue extension Motor: Right : Upper extremity   5/5  Left:     Upper extremity   5/5  Lower extremity   5/5   Lower extremity   5/5 Tone and bulk:normal tone throughout; no atrophy noted Sensory:  light touch intact throughout, bilaterally Deep Tendon Reflexes: 2+  and symmetric biceps and patella Plantars: Right: downgoing   Left: downgoing Cerebellar: normal finger-to-nose, normal rapid  alternating movements and normal heel-to-shin test Gait: deferred   Lab Results: Basic Metabolic Panel: Recent Labs  Lab 02/16/20 1458 02/16/20 1504  NA 138 140  K 4.4 4.4  CL 104 102  CO2 26  --   GLUCOSE 120* 114*  BUN 13 18  CREATININE 0.85 0.70  CALCIUM 9.7  --     CBC: Recent Labs  Lab 02/16/20 1458 02/16/20 1504  WBC 6.6  --   NEUTROABS 4.4  --   HGB 16.2 17.0  HCT 48.9 50.0  MCV 90.1  --   PLT 192  --     Imaging: No results found.    Laurey Morale, MSN, NP-C [facility] [phone] 02/16/2020, 5:48 PM   Assessment: 80 y.o. male with a PMHx of DVT (1998/2002 on Coumadin), a. Fib (Coumadin), HTN, CAD, HLD and BPH who presented to the [facility] ED after an episode of transient aphasia at home.  1. CT head did not show hemorrhage.  2. MRI preliminary review of MRI brain reveals a small left perisylvian acute ischemic infarction. GRE images show a small linear focus of hypointensity near the stroke consistent with intraluminal thrombus.  3. Not a candidate for tPA or endovascular intervention as symptoms have resolved.  4. Patient is at high risk for stroke d/t a. Fib while off anticoagulation and should be started on IV heparin with admission for complete stroke work-up. The stroke is small enough not to present a significant risk for hemorrhagic conversion. Benefits of heparinization outweigh risks. Discussed with patient and his wife who expressed understanding and agreement with the plan.  5. After stroke work up is completed, if patient wishes to have TURP performed here, would contact Urology and coordinate when to hold heparin for the procedure. However, the patient states at this time that he simply wants to cancel his TURP with his own surgeon at [facility], and due to his stroke no longer wishes to have a procedure performed, just the stroke work up followed by restarting of Coumadin. Possibly there will be a change of mind as his wife is  strongly in favor of him proceeding with TURP.  6. Stroke Risk Factors - DVT, atrial fibrillation, HTN, CAD and HLD    Recommendations: -- CTA head and neck -- Echocardiogram -- IV heparin -- HgbA1c, fasting lipid panel -- PT consult, OT consult, Speech consult --Telemetry monitoring --Frequent neuro checks --Stroke swallow screen  -- Modified permissive HTN protocol given advanced age. Correct SBP if > 180. Continue until 2 PM tomorrow, then reduce SBP by 15% per day to goal of 120-140  I have seen and examined the patient. I have formulated the assessment and recommendations. 80 year old male presenting with a left MCA stroke after Coumadin was held for a TURP scheduled later this week. Imaging findings as above. Plan is for stroke work up. On heparin gtt.  Electronically signed: Dr. Kerney Elbe

## 2020-02-16 NOTE — Progress Notes (Signed)
ANTICOAGULATION CONSULT NOTE  Pharmacy Consult for Heparin Indication: atrial fibrillation  Allergies  Allergen Reactions  . Other Other (See Comments)    Some form of numbing medication used by dentist, almost died  . Simvastatin Other (See Comments)    Muscle aches     Patient Measurements: Height: 6' (182.9 cm) Weight: 98 kg (216 lb 0.8 oz) IBW/kg (Calculated) : 77.6 Heparin Dosing Weight: 97.3 mg  Vital Signs: Temp: 97.9 F (36.6 C) (01/02 1606) Temp Source: Oral (01/02 1606) BP: 160/88 (01/02 1730) Pulse Rate: 81 (01/02 1730)  Labs: Recent Labs    02/16/20 1458 02/16/20 1504  HGB 16.2 17.0  HCT 48.9 50.0  PLT 192  --   APTT 27  --   LABPROT 13.2  --   INR 1.0  --   CREATININE 0.85 0.70    Estimated Creatinine Clearance: 89.4 mL/min (by C-G formula based on SCr of 0.7 mg/dL).   Medical History: Past Medical History:  Diagnosis Date  . BPH (benign prostatic hypertrophy)   . BPH associated with nocturia 08/29/2013  . BPH with obstruction/lower urinary tract symptoms 08/14/2019  . CAD (coronary artery disease)   . CAD (coronary artery disease), native coronary artery    PTCA of RCA 1986, PTCA of LAD 1992,  Negative treadmill Cardiolite 2011   . Chronic dermatitis of hands 10/12/2015  . Chronic venous insufficiency   . Coagulation disorder ([facility]) 10/26/2018  . DVT (deep venous thrombosis) ([facility])   . Dyslipidemia   . Eczema   . Erectile dysfunction 07/11/2012  . Essential hypertension   . GERD 08/04/2006      . GERD (gastroesophageal reflux disease)   . Hyperglycemia 07/08/2019  . Hyperlipidemia   . Hypertension   . Incomplete emptying of bladder 08/14/2019  . Long term current use of anticoagulant therapy   . Lumbar disc disease   . Overweight (BMI 25.0-29.9) 06/18/2015  . Persistent atrial fibrillation ([facility]) 04/10/2018  . Personal history of DVT (deep vein thrombosis)    Initially in 1998 with recurrence in 2002 now on chronic warfarin   . Soft tissue  lesion of foot 08/27/2015  . Urinary tract infection with hematuria 08/14/2019    Medications:  Scheduled:  . heparin  5,800 Units Intravenous Once  . sodium chloride flush  3 mL Intravenous Once    Assessment Patient is a 85 yom that is being admitted for a TIA/CVA workup. Patient is normally on warfarin for afib however is being held for a turp. Pharmacy has been asked to dose heparin at this time for afib with concern for a cva.  Goal of Therapy:  Heparin level 0.3-0.5 units/ml Monitor platelets by anticoagulation protocol: Yes   Plan:  - No heparin bolus 2/2 concern for cva  - Heparin drip @ 1450 units/hr - Heparin level in ~ 8 hours  - Monitor patient for s/s of bleeding and CBC while on heparin   Joaquim Lai PharmD. BCPS  02/16/2020,6:19 PM

## 2020-02-16 NOTE — ED Notes (Signed)
Back from MRI.

## 2020-02-16 NOTE — H&P (Signed)
History and Physical   William Norman [id number] DOB: March 07, 1940 DOA: 02/16/2020  PCP: Vivi Barrack, MD  Outpatient Specialists: Dr. Amalia Hailey, [facility] Patient coming from: Home  I have personally briefly reviewed patient's old medical records in [place].  Chief Concern: Difficulty speaking  HPI: William Norman is a 80 y.o. male with medical history significant for hypertension, atrial fibrillation on warfarin, history of DVT, BPH, dyslipidemia, CAD, presented to the emergency department for chief concerns of sudden onset difficulty speaking.  He states that he was not able to converse, approximately 1-2 pm on 02/16/19. He states he had difficulty getting the word out and denies difficulty understanding other people speaking. This lasted approximately 20-30 minutes. He denies experiencing these symptoms before. He denies extremity weakness or tingling. He denies difficulty walking, vision changes, shortness of breath, chest pain, nausea, vomiting, difficulty swallowing, pain with swallowing.   He denies seizure activities. He endorses baseline tremors when he is trying to initiate activities like working on computers and holding a glass of water.   His last dose of warfarin was Wednesday, 02/12/20 in anticipation of cystoscopy with TURP on 02/18/2020.  He endorses his symptoms have improved during my evaluation.   ROS: Constitutional: no weight change, no fever ENT/Mouth: no sore throat, no rhinorrhea, he endorses baseline ringing in his ears and this has been on-going for months Eyes: no eye pain, no vision changes Cardiovascular: no chest pain, no dyspnea, no palpitations. He endorses baseline swelling in his legs with walking and standing.  Respiratory: no cough, no sputum, no wheezing Gastrointestinal: no nausea, no vomiting, no diarrhea, no constipation Genitourinary: no urinary incontinence, no dysuria, no hematuria Musculoskeletal:  no arthralgias, no myalgias Skin: no skin lesions, no pruritus, Neuro: no weakness, no loss of consciousness, no syncope Psych: no anxiety, no depression, no decrease appetite Heme/Lymph: no bruising, no bleeding  ED Course: Discussed patient case with ED provider.  Patient requiring hospitalization for stroke work-up.  Initial ED vitals were afebrile, respiration rate 16, heart rate 128, blood pressure 140/85, SPO2 96% on room air.  Initial labs in the emergency department were unrevealing.  PT was 13.2, INR 1.0, PTT was 27. CT of the head without contrast was negative for stroke.  MRI of the brain without contrast was ordered and pending read.  EKG was done showing atrial fibrillation.   Assessment/Plan  Principal Problem:   Stroke [facility]) Active Problems:   Dyslipidemia   Essential hypertension   Long term current use of anticoagulant therapy   Personal history of DVT (deep vein thrombosis)   BPH associated with nocturia   CAD (coronary artery disease)   BPH with obstruction/lower urinary tract symptoms   Transit aphasia suspect secondary to left perisylvian acute ischemic infarction -In setting of high risk for stroke due to atrial fibrillation off anticoagulation for TURP procedure -Not a candidate for TPA or endovascular intervention at this time -MRI of the brain without contrast -CTA of the head and neck ordered -Echo with bubble study -Hemoglobin, fasting lipid panel -Frequent neurochecks -PT/OT/SLP -Per neurology, modified permissive hypertension protocol given advanced age, correct if SBP greater than 180, continue until 2 PM tomorrow, then reduce SBP by 15 %/day to goal of 120-140 -Observation with telemetry monitoring  Hypertension-holding home amlodipine, lisinopril, atenolol at this time for permissive hypertension per above -Primary hospitalist team to resume as appropriate per above -As needed meds: Labetalol 5 mg IV every 2 hours as needed for SBP  greater than  180 until 1400 on 02/17/2020  Dyslipidemia-atorvastatin 40 nightly  Chronic tremors - query essential tremors vs parkinson - Outpatient evaluation recommended  Chart reviewed.   DVT prophylaxis: On heparin GTT Code Status: full code  Diet: RN passed bedside swallow, Heart healthy diet placed Family Communication: updated spouse at bedside  Disposition Plan: Clinical course Consults called: neurology  Admission status: Observation with telemetry  Past Medical History:  Diagnosis Date  . BPH (benign prostatic hypertrophy)   . BPH associated with nocturia 08/29/2013  . BPH with obstruction/lower urinary tract symptoms 08/14/2019  . CAD (coronary artery disease)   . CAD (coronary artery disease), native coronary artery    PTCA of RCA 1986, PTCA of LAD 1992,  Negative treadmill Cardiolite 2011   . Chronic dermatitis of hands 10/12/2015  . Chronic venous insufficiency   . Coagulation disorder (HCC) 10/26/2018  . DVT (deep venous thrombosis) (HCC)   . Dyslipidemia   . Eczema   . Erectile dysfunction 07/11/2012  . Essential hypertension   . GERD 08/04/2006      . GERD (gastroesophageal reflux disease)   . Hyperglycemia 07/08/2019  . Hyperlipidemia   . Hypertension   . Incomplete emptying of bladder 08/14/2019  . Long term current use of anticoagulant therapy   . Lumbar disc disease   . Overweight (BMI 25.0-29.9) 06/18/2015  . Persistent atrial fibrillation (HCC) 04/10/2018  . Personal history of DVT (deep vein thrombosis)    Initially in 1998 with recurrence in 2002 now on chronic warfarin   . Soft tissue lesion of foot 08/27/2015  . Urinary tract infection with hematuria 08/14/2019   Past Surgical History:  Procedure Laterality Date  . CARDIAC CATHETERIZATION    . CARPAL TUNNEL RELEASE  04/21/2011   Procedure: CARPAL TUNNEL RELEASE;  Surgeon: Tami Ribas, MD;  Location: [facility];  Service: Orthopedics;  Laterality: Left;  . CARPAL TUNNEL RELEASE  05/23/2011    Procedure: CARPAL TUNNEL RELEASE;  Surgeon: Tami Ribas, MD;  Location: [facility];  Service: Orthopedics;  Laterality: Right;  . COLON SURGERY  2010 and 2011 colostomy reversal  . CORONARY ANGIOPLASTY    . EYE SURGERY     repair of macular hole  . NASAL SEPTUM SURGERY    . precutaneous transluminal coronary angioplasty    . right hernia    . TONSILLECTOMY AND ADENOIDECTOMY     Social History:  reports that he quit smoking about 21 years ago. His smoking use included cigarettes. He has a 60.00 pack-year smoking history. He has never used smokeless tobacco. He reports current alcohol use of about 7.0 standard drinks of alcohol per week. He reports that he does not use drugs.  Allergies  Allergen Reactions  . Other Other (See Comments)    Some form of numbing medication used by dentist, almost died  . Simvastatin Other (See Comments)    Muscle aches    Family History  Problem Relation Age of Onset  . Heart disease Father   . Heart attack Father   . Heart disease Mother   . AAA (abdominal aortic aneurysm) Brother    Family history: Family history reviewed and not pertinent  Prior to Admission medications   Medication Sig Start Date End Date Taking? Authorizing Provider  amLODipine (NORVASC) 5 MG tablet TAKE 1 TABLET BY MOUTH EVERY OTHER DAY 01/19/20   [name], MD  atenolol (TENORMIN) 100 MG tablet TAKE 1 TABLET BY MOUTH EVERY DAY  02/12/20   Vivi Barrack, MD  atorvastatin (LIPITOR) 40 MG tablet Take 0.5 tablets (20 mg total) by mouth daily. 07/08/19   Vivi Barrack, MD  Coenzyme Q10 (COQ10) 200 MG CAPS Take 1 capsule by mouth daily.     [provider]  Cyanocobalamin (VITAMIN B-12) 1000 MCG SUBL Place 1 tablet under the tongue daily.    [provider]  hydroxypropyl methylcellulose / hypromellose (ISOPTO TEARS / GONIOVISC) 2.5 % ophthalmic solution Place 1 drop into both eyes 4 (four) times daily as needed for dry eyes.    [provider]  lisinopril (ZESTRIL) 40 MG tablet TAKE 1 TABLET BY MOUTH EVERY DAY 01/19/20   Vivi Barrack, MD  Melatonin ([employer] MELATONIN) 5 MG SUBL Place 1 tablet under the tongue at bedtime.    [provider]  nitroGLYCERIN (NITROSTAT) 0.4 MG SL tablet Dissolve 1 tablet under the tongue every 5 minutes as needed 01/19/17   Vivi Barrack, MD  tamsulosin (FLOMAX) 0.4 MG CAPS capsule Take 0.4 mg by mouth daily. 12/09/19   [provider]  warfarin (COUMADIN) 5 MG tablet TAKE 1 TABLET BY MOUTH DAILY OR AS DIRECTED BY ANTICOAGULATION CLINIC. 06/25/19   Vivi Barrack, MD   Physical Exam: Vitals:   02/16/20 1443 02/16/20 1606 02/16/20 1730 02/16/20 1800  BP: 140/85 (!) 141/99 (!) 160/88   Pulse: (!) 128 82 81   Resp: 16 16 17    Temp: 98.7 F (37.1 C) 97.9 F (36.6 C)    TempSrc: Oral Oral    SpO2: 96% 94% 97%   Weight:    98 kg  Height:    6' (1.829 m)   Constitutional: appears age-appropriate, NAD, calm, comfortable Eyes: PERRL, lids and conjunctivae normal ENMT: Mucous membranes are moist. Posterior pharynx clear of any exudate or lesions.  Age-appropriate dentition. Trachea is midline. Hearing appropriate Neck: normal, supple, no masses, no thyromegaly Respiratory: clear to auscultation bilaterally, no wheezing, no crackles. Normal respiratory effort. No accessory muscle use.  Cardiovascular: Irregular rate and rhythm, no murmurs / rubs / gallops. No extremity edema. 2+ pedal pulses. No carotid bruits.  Abdomen: no tenderness, no masses palpated, no hepatosplenomegaly. Bowel sounds positive.  Musculoskeletal: no clubbing / cyanosis. No joint deformity upper and lower extremities. Good ROM, no contractures, no atrophy. Normal muscle tone. Clubbing of the right, 2nd, 3rd, and 4th fingers.  Skin: no rashes, lesions, ulcers. No induration Neurologic: Sensation intact. Strength 5/5 in all 4.  Psychiatric: Normal judgment and insight. Alert and oriented x 3. Normal mood.    EKG: independently reviewed, showing atrial fibrillation with a rate of 73, QTc 446, LVH  CT the head without contrast on Admission: I personally reviewed and I agree with radiologist reading as below.  No results found.  Labs on Admission: I have personally reviewed following labs  CBC: Recent Labs  Lab 02/16/20 1458 02/16/20 1504  WBC 6.6  --   NEUTROABS 4.4  --   HGB 16.2 17.0  HCT 48.9 50.0  MCV 90.1  --   PLT 192  --    Basic Metabolic Panel: Recent Labs  Lab 02/16/20 1458 02/16/20 1504  NA 138 140  K 4.4 4.4  CL 104 102  CO2 26  --   GLUCOSE 120* 114*  BUN 13 18  CREATININE 0.85 0.70  CALCIUM 9.7  --    GFR: Estimated Creatinine Clearance: 89.4 mL/min (by C-G formula based on SCr of 0.7 mg/dL).  Liver  Function Tests: Recent Labs  Lab 02/16/20 1458  AST 32  ALT 23  ALKPHOS 53  BILITOT 1.4*  PROT 6.6  ALBUMIN 3.8   Coagulation Profile: Recent Labs  Lab 02/16/20 1458  INR 1.0   CBG: Recent Labs  Lab 02/16/20 1836  GLUCAP 119*   Urine analysis:    Component Value Date/Time   COLORURINE YELLOW 03/21/2014 0420   APPEARANCEUR CLEAR 03/21/2014 0420   LABSPEC 1.017 03/21/2014 0420   PHURINE 5.0 03/21/2014 0420   GLUCOSEU NEGATIVE 03/21/2014 0420   HGBUR SMALL (A) 03/21/2014 0420   HGBUR 1+ 02/13/2007 0000   BILIRUBINUR negative 09/23/2019 1527   BILIRUBINUR n 10/14/2014 1012   KETONESUR negative 09/23/2019 1527   KETONESUR NEGATIVE 03/21/2014 0420   PROTEINUR negative 09/23/2019 1527   PROTEINUR n 10/14/2014 1012   PROTEINUR NEGATIVE 03/21/2014 0420   UROBILINOGEN 0.2 09/23/2019 1527   UROBILINOGEN 0.2 03/21/2014 0420   NITRITE Negative 09/23/2019 1527   NITRITE n 10/14/2014 1012   NITRITE NEGATIVE 03/21/2014 0420   LEUKOCYTESUR Trace (A) 09/23/2019 1527   Dakoda Bassette N Junella Domke D.O. [facility]  If 7PM-7AM, please contact overnight-coverage provider If 7AM-7PM, please contact day coverage provider [email or web address]  02/16/2020, 9:52 PM

## 2020-02-16 NOTE — ED Provider Notes (Addendum)
[facility] EMERGENCY DEPARTMENT Provider Note   CSN: [id number] Arrival date & time: 02/16/20  1437     History Chief Complaint  Patient presents with  . Aphasia    William Norman is a 80 y.o. male.  Patient with acute onset difficulty expressing his thoughts, trouble speaking this AM. Was at rest, talking with family member when symptoms started acutely. Symptoms were constant, moderate, but improved/resolved after ~ 20 minutes. ?mild left leg weakness then, denies currently. Denies headache. No neck pain. No chest pain or discomfort. Denies change in vision. No facial droop. No problems w balance or coordination. Denies hx tia or cva. Does have hx afib, on currently holding anticoag therapy in prep for TURP next week.   The history is provided by the patient, the EMS personnel and a relative.       Past Medical History:  Diagnosis Date  . BPH (benign prostatic hypertrophy)   . BPH associated with nocturia 08/29/2013  . BPH with obstruction/lower urinary tract symptoms 08/14/2019  . CAD (coronary artery disease)   . CAD (coronary artery disease), native coronary artery    PTCA of RCA 1986, PTCA of LAD 1992,  Negative treadmill Cardiolite 2011   . Chronic dermatitis of hands 10/12/2015  . Chronic venous insufficiency   . Coagulation disorder ([place]) 10/26/2018  . DVT (deep venous thrombosis) ([place])   . Dyslipidemia   . Eczema   . Erectile dysfunction 07/11/2012  . Essential hypertension   . GERD 08/04/2006      . GERD (gastroesophageal reflux disease)   . Hyperglycemia 07/08/2019  . Hyperlipidemia   . Hypertension   . Incomplete emptying of bladder 08/14/2019  . Long term current use of anticoagulant therapy   . Lumbar disc disease   . Overweight (BMI 25.0-29.9) 06/18/2015  . Persistent atrial fibrillation ([place]) 04/10/2018  . Personal history of DVT (deep vein thrombosis)    Initially in 1998 with recurrence in 2002 now on chronic warfarin   . Soft  tissue lesion of foot 08/27/2015  . Urinary tract infection with hematuria 08/14/2019    Patient Active Problem List   Diagnosis Date Noted  . Hypertension   . Hyperlipidemia   . GERD (gastroesophageal reflux disease)   . Eczema   . DVT (deep venous thrombosis) ([place])   . CAD (coronary artery disease)   . BPH with obstruction/lower urinary tract symptoms 08/14/2019  . Incomplete emptying of bladder 08/14/2019  . Urinary tract infection with hematuria 08/14/2019  . Hyperglycemia 07/08/2019  . Coagulation disorder ([place]) 10/26/2018  . Persistent atrial fibrillation ([place]) 04/10/2018  . Chronic dermatitis of hands 10/12/2015  . Soft tissue lesion of foot 08/27/2015  . Overweight (BMI 25.0-29.9) 06/18/2015  . BPH associated with nocturia 08/29/2013  . CAD (coronary artery disease), native coronary artery   . Long term current use of anticoagulant therapy   . Personal history of DVT (deep vein thrombosis)   . Chronic venous insufficiency   . Erectile dysfunction 07/11/2012  . Lumbar disc disease   . GERD 08/04/2006  . Dyslipidemia   . Essential hypertension     Past Surgical History:  Procedure Laterality Date  . CARDIAC CATHETERIZATION    . CARPAL TUNNEL RELEASE  04/21/2011   Procedure: CARPAL TUNNEL RELEASE;  Surgeon: Tennis Must, MD;  Location: [facility];  Service: Orthopedics;  Laterality: Left;  . CARPAL TUNNEL RELEASE  05/23/2011   Procedure: CARPAL TUNNEL RELEASE;  Surgeon: Tennis Must,  MD;  Location: [facility];  Service: Orthopedics;  Laterality: Right;  . COLON SURGERY  2010 and 2011 colostomy reversal  . CORONARY ANGIOPLASTY    . EYE SURGERY     repair of macular hole  . NASAL SEPTUM SURGERY    . precutaneous transluminal coronary angioplasty    . right hernia    . TONSILLECTOMY AND ADENOIDECTOMY         Family History  Problem Relation Age of Onset  . Heart disease Father   . Heart attack Father   . Heart disease Mother   . AAA  (abdominal aortic aneurysm) Brother     Social History   Tobacco Use  . Smoking status: Former Smoker    Packs/day: 1.00    Years: 60.00    Pack years: 60.00    Types: Cigarettes    Quit date: 03/25/1998    Years since quitting: 21.9  . Smokeless tobacco: Never Used  Vaping Use  . Vaping Use: Never used  Substance Use Topics  . Alcohol use: Yes    Alcohol/week: 7.0 standard drinks    Types: 7 Glasses of wine per week    Comment: 7 glasses of red wine per week   . Drug use: No    Home Medications Prior to Admission medications   Medication Sig Start Date End Date Taking? Authorizing Provider  amLODipine (NORVASC) 5 MG tablet TAKE 1 TABLET BY MOUTH EVERY OTHER DAY 01/19/20   [name], MD  atenolol (TENORMIN) 100 MG tablet TAKE 1 TABLET BY MOUTH EVERY DAY 02/12/20   [name], MD  atorvastatin (LIPITOR) 40 MG tablet Take 0.5 tablets (20 mg total) by mouth daily. 07/08/19   [name], MD  Coenzyme Q10 (COQ10) 200 MG CAPS Take 1 capsule by mouth daily.     [provider]  Cyanocobalamin (VITAMIN B-12) 1000 MCG SUBL Place 1 tablet under the tongue daily.    [provider]  hydroxypropyl methylcellulose / hypromellose (ISOPTO TEARS / GONIOVISC) 2.5 % ophthalmic solution Place 1 drop into both eyes 4 (four) times daily as needed for dry eyes.    [provider]  lisinopril (ZESTRIL) 40 MG tablet TAKE 1 TABLET BY MOUTH EVERY DAY 01/19/20   [name], MD  Melatonin ([employer] MELATONIN) 5 MG SUBL Place 1 tablet under the tongue at bedtime.    [provider]  nitroGLYCERIN (NITROSTAT) 0.4 MG SL tablet Dissolve 1 tablet under the tongue every 5 minutes as needed 01/19/17   [name], MD  tamsulosin (FLOMAX) 0.4 MG CAPS capsule Take 0.4 mg by mouth daily. 12/09/19   [provider]  warfarin (COUMADIN) 5 MG tablet TAKE 1 TABLET BY MOUTH DAILY OR AS DIRECTED BY ANTICOAGULATION CLINIC. 06/25/19   [name], MD     Allergies    Other and Simvastatin  Review of Systems   Review of Systems  Constitutional: Negative for fever.  HENT: Negative for sore throat.   Eyes: Negative for visual disturbance.  Respiratory: Negative for shortness of breath.   Cardiovascular: Negative for chest pain.  Gastrointestinal: Negative for abdominal pain.  Genitourinary: Negative for flank pain.  Musculoskeletal: Negative for back pain and neck pain.  Skin: Negative for rash.  Neurological: Positive for speech difficulty. Negative for headaches.  Hematological: Does not bruise/bleed easily.  Psychiatric/Behavioral: Negative for confusion.    Physical Exam Updated Vital Signs BP (!) 141/99 (BP Location: Right Arm)   Pulse  82   Temp 97.9 F (36.6 C) (Oral)   Resp 16   SpO2 94%   Physical Exam Vitals and nursing note reviewed.  Constitutional:      Appearance: Normal appearance. He is well-developed.  HENT:     Head: Atraumatic.     Nose: Nose normal.     Mouth/Throat:     Mouth: Mucous membranes are moist.     Pharynx: Oropharynx is clear.  Eyes:     General: No scleral icterus.    Conjunctiva/sclera: Conjunctivae normal.     Pupils: Pupils are equal, round, and reactive to light.  Neck:     Vascular: No carotid bruit.     Trachea: No tracheal deviation.  Cardiovascular:     Rate and Rhythm: Normal rate and regular rhythm.     Pulses: Normal pulses.     Heart sounds: Normal heart sounds. No murmur heard. No friction rub. No gallop.   Pulmonary:     Effort: Pulmonary effort is normal. No accessory muscle usage or respiratory distress.     Breath sounds: Normal breath sounds.  Abdominal:     General: Bowel sounds are normal. There is no distension.     Palpations: Abdomen is soft.     Tenderness: There is no abdominal tenderness. There is no guarding.  Genitourinary:    Comments: No cva tenderness. Musculoskeletal:        General: No swelling.     Cervical back: Normal range of motion  and neck supple. No rigidity.  Skin:    General: Skin is warm and dry.     Findings: No rash.  Neurological:     Mental Status: He is alert and oriented to person, place, and time.     Cranial Nerves: No cranial nerve deficit.     Comments: Alert, speech clear.while pt/fam do report speech markedly improved from prior, he still occasionally has word finding deficit.  Motor intact bil, stre 5/5. No pronator drift. Sens grossly intact bil.   Psychiatric:        Mood and Affect: Mood normal.     ED Results / Procedures / Treatments   Labs (all labs ordered are listed, but only abnormal results are displayed) Results for orders placed or performed during the hospital encounter of 02/16/20  Protime-INR  Result Value Ref Range   Prothrombin Time 13.2 11.4 - 15.2 seconds   INR 1.0 0.8 - 1.2  APTT  Result Value Ref Range   aPTT 27 24 - 36 seconds  CBC  Result Value Ref Range   WBC 6.6 4.0 - 10.5 K/uL   RBC 5.43 4.22 - 5.81 MIL/uL   Hemoglobin 16.2 13.0 - 17.0 g/dL   HCT 48.9 39.0 - 52.0 %   MCV 90.1 80.0 - 100.0 fL   MCH 29.8 26.0 - 34.0 pg   MCHC 33.1 30.0 - 36.0 g/dL   RDW 14.0 11.5 - 15.5 %   Platelets 192 150 - 400 K/uL   nRBC 0.0 0.0 - 0.2 %  Differential  Result Value Ref Range   Neutrophils Relative % 65 %   Neutro Abs 4.4 1.7 - 7.7 K/uL   Lymphocytes Relative 22 %   Lymphs Abs 1.4 0.7 - 4.0 K/uL   Monocytes Relative 10 %   Monocytes Absolute 0.7 0.1 - 1.0 K/uL   Eosinophils Relative 1 %   Eosinophils Absolute 0.0 0.0 - 0.5 K/uL   Basophils Relative 1 %   Basophils Absolute 0.1 0.0 -  0.1 K/uL   Immature Granulocytes 1 %   Abs Immature Granulocytes 0.04 0.00 - 0.07 K/uL  Comprehensive metabolic panel  Result Value Ref Range   Sodium 138 135 - 145 mmol/L   Potassium 4.4 3.5 - 5.1 mmol/L   Chloride 104 98 - 111 mmol/L   CO2 26 22 - 32 mmol/L   Glucose, Bld 120 (H) 70 - 99 mg/dL   BUN 13 8 - 23 mg/dL   Creatinine, Ser 0.85 0.61 - 1.24 mg/dL   Calcium 9.7 8.9 -  10.3 mg/dL   Total Protein 6.6 6.5 - 8.1 g/dL   Albumin 3.8 3.5 - 5.0 g/dL   AST 32 15 - 41 U/L   ALT 23 0 - 44 U/L   Alkaline Phosphatase 53 38 - 126 U/L   Total Bilirubin 1.4 (H) 0.3 - 1.2 mg/dL   GFR, Estimated >60 >60 mL/min   Anion gap 8 5 - 15  I-stat chem 8, ED  Result Value Ref Range   Sodium 140 135 - 145 mmol/L   Potassium 4.4 3.5 - 5.1 mmol/L   Chloride 102 98 - 111 mmol/L   BUN 18 8 - 23 mg/dL   Creatinine, Ser 0.70 0.61 - 1.24 mg/dL   Glucose, Bld 114 (H) 70 - 99 mg/dL   Calcium, Ion 1.25 1.15 - 1.40 mmol/L   TCO2 29 22 - 32 mmol/L   Hemoglobin 17.0 13.0 - 17.0 g/dL   HCT 50.0 39.0 - 52.0 %    EKG EKG Interpretation  Date/Time:  Sunday February 16 2020 17:38:36 EST Ventricular Rate:  73 PR Interval:    QRS Duration: 104 QT Interval:  404 QTC Calculation: 446 R Axis:   74 Text Interpretation: Atrial fibrillation Confirmed by Lajean Saver [phone]) on 02/16/2020 5:41:17 PM   Radiology CT: no hem or large acute cva MRI: +acute cva    Procedures Procedures (including critical care time)  Medications Ordered in ED Medications  sodium chloride flush (NS) 0.9 % injection 3 mL (has no administration in time range)    ED Course  I have reviewed the triage vital signs and the nursing notes.  Pertinent labs & imaging results that were available during my care of the patient were reviewed by me and considered in my medical decision making (see chart for details).    MDM Rules/Calculators/A&P                          Iv ns. Continuous pulse ox and cardiac monitoring. Stat labs and imaging.  Reviewed nursing notes and prior charts for additional history.   CT reviewed/interpreted by me - no hem.  Labs reviewed/interpreted by me - chem normal.  MRI ordered.   MRI reviewed/interpreted by me - acute cva.   Neurology emergently consulted - discussed w Dr Cheral Marker, he recommends heparin, admission.  Iv Heparin ordered/started in ED - heparin dosing per  pharmacy protocol - pharmacy consulted.   Neurochecks. No change in exam from prior. Swallow screen.   Hospitalists consulted for admission.   CRITICAL CARE RE: acute CVA, hx afib, embolic cva suspected, iv heparin therapy instituted Performed by: Mirna Mires Total critical care time: 35 minutes Critical care time was exclusive of separately billable procedures and treating other patients. Critical care was necessary to treat or prevent imminent or life-threatening deterioration. Critical care was time spent personally by me on the following activities: development of treatment plan with patient and/or surrogate as  well as nursing, discussions with consultants, evaluation of patient's response to treatment, examination of patient, obtaining history from patient or surrogate, ordering and performing treatments and interventions, ordering and review of laboratory studies, ordering and review of radiographic studies, pulse oximetry and re-evaluation of patient's condition.    Final Clinical Impression(s) / ED Diagnoses Final diagnoses:  None    Rx / DC Orders ED Discharge Orders    None       Lajean Saver, MD 02/16/20 [id number]      Lajean Saver, MD 02/24/20 [phone]

## 2020-02-17 ENCOUNTER — Observation Stay (HOSPITAL_BASED_OUTPATIENT_CLINIC_OR_DEPARTMENT_OTHER): Payer: Medicare HMO

## 2020-02-17 DIAGNOSIS — N401 Enlarged prostate with lower urinary tract symptoms: Secondary | ICD-10-CM | POA: Diagnosis not present

## 2020-02-17 DIAGNOSIS — I361 Nonrheumatic tricuspid (valve) insufficiency: Secondary | ICD-10-CM | POA: Diagnosis not present

## 2020-02-17 DIAGNOSIS — I6300 Cerebral infarction due to thrombosis of unspecified precerebral artery: Secondary | ICD-10-CM | POA: Diagnosis not present

## 2020-02-17 DIAGNOSIS — I6389 Other cerebral infarction: Secondary | ICD-10-CM

## 2020-02-17 DIAGNOSIS — I351 Nonrheumatic aortic (valve) insufficiency: Secondary | ICD-10-CM

## 2020-02-17 LAB — CBC
HCT: 50.3 % (ref 39.0–52.0)
Hemoglobin: 16.1 g/dL (ref 13.0–17.0)
MCH: 28.7 pg (ref 26.0–34.0)
MCHC: 32 g/dL (ref 30.0–36.0)
MCV: 89.7 fL (ref 80.0–100.0)
Platelets: 185 10*3/uL (ref 150–400)
RBC: 5.61 MIL/uL (ref 4.22–5.81)
RDW: 13.7 % (ref 11.5–15.5)
WBC: 7.6 10*3/uL (ref 4.0–10.5)
nRBC: 0 % (ref 0.0–0.2)

## 2020-02-17 LAB — LIPID PANEL
Cholesterol: 143 mg/dL (ref 0–200)
HDL: 48 mg/dL (ref >40)
LDL Cholesterol: 86 mg/dL (ref 0–99)
Total CHOL/HDL Ratio: 3 RATIO
Triglycerides: 45 mg/dL (ref <150)
VLDL: 9 mg/dL (ref 0–40)

## 2020-02-17 LAB — HEMOGLOBIN A1C
Hgb A1c MFr Bld: 5.5 % (ref 4.8–5.6)
Mean Plasma Glucose: 111.15 mg/dL

## 2020-02-17 LAB — ECHOCARDIOGRAM COMPLETE BUBBLE STUDY
AR max vel: 2.35 cm2
AV Area VTI: 2.6 cm2
AV Area mean vel: 2.1 cm2
AV Mean grad: 2.3 mmHg
AV Peak grad: 3.7 mmHg
Ao pk vel: 0.96 m/s
S' Lateral: 3.6 cm

## 2020-02-17 LAB — RESP PANEL BY RT-PCR (FLU A&B, COVID) ARPGX2
Influenza A by PCR: NEGATIVE
Influenza B by PCR: NEGATIVE
SARS Coronavirus 2 by RT PCR: NEGATIVE

## 2020-02-17 LAB — HEPARIN LEVEL (UNFRACTIONATED)
Heparin Unfractionated: 1 IU/mL — ABNORMAL HIGH (ref 0.30–0.70)
Heparin Unfractionated: 0.13 IU/mL — ABNORMAL LOW (ref 0.30–0.70)

## 2020-02-17 MED ORDER — HEPARIN (PORCINE) 25000 UT/250ML-% IV SOLN
1150.0000 [IU]/h | INTRAVENOUS | Status: DC
  Administered 2020-02-17: 1150 [IU]/h via INTRAVENOUS
  Filled 2020-02-17: qty 250

## 2020-02-17 MED ORDER — APIXABAN 5 MG PO TABS: 5.0000 mg | ORAL_TABLET | Freq: Two times a day (BID) | ORAL | 11 refills | Status: DC

## 2020-02-17 MED ORDER — APIXABAN 5 MG PO TABS
5.0000 mg | ORAL_TABLET | Freq: Two times a day (BID) | ORAL | Status: DC
  Administered 2020-02-17: 5 mg via ORAL
  Filled 2020-02-17: qty 1

## 2020-02-17 MED FILL — ELIQUIS 5 MG TABLET: 5 | 30 days supply | Qty: 60 | Fill #0

## 2020-02-17 NOTE — Evaluation (Addendum)
Occupational Therapy Evaluation Patient Details Name: William Norman MRN: [id number] DOB: 21-Nov-1940 Today's Date: 02/17/2020    History of Present Illness Patient is a 80 y/o male who presents with aphasia and LLE weakness. Head CT-unremarkable. MRI- left perisylvian acute infarct. PMH includes HTN, HLD, DVT, CAD, BPH.   Clinical Impression   PTA pt living with family and functioning at independent level. At time of eval, pt able to complete all mobility and ADL at mod I level. No overt balance or cognitive deficits noted. No unilateral strength deficits noted. Pt was able to complete household level functional mobility and divide attention with multi step commands during mobility. Some mild word finding difficulty was noted at this time, but pt is aware and self corrects. He was also seen completing higher level balance activities on ADL without high risk of fall. No further OT needs identified, OT will sign off. Thank you for this consult.    Follow Up Recommendations  No OT follow up    Equipment Recommendations  None recommended by OT    Recommendations for Other Services       Precautions / Restrictions Precautions Precautions: Fall Precaution Comments: watch HR Restrictions Weight Bearing Restrictions: No      Mobility Bed Mobility Overal bed mobility: Modified Independent             General bed mobility comments: No assist needed.    Transfers Overall transfer level: Modified independent Equipment used: None Transfers: Sit to/from Stand Sit to Stand: Supervision         General transfer comment: Stood from EOB x1 without difficulty; transferred to chair post ambulation.    Balance Overall balance assessment: Mild deficits observed, not formally tested Sitting-balance support: Feet supported;No upper extremity supported Sitting balance-Leahy Scale: Good     Standing balance support: During functional activity Standing balance-Leahy Scale:  Fair Standing balance comment: Supervision for safety               High Level Balance Comments: Able to step over objects and change in direction with only mild deviations in gait but no overt LOB.           ADL either performed or assessed with clinical judgement   ADL Overall ADL's : Modified independent                                       General ADL Comments: Pt demonstrates the ability to complete all ADL at mod I level. Pt stood to don foot wear while intermittently standing on one leg. No safety concerns or overt LOB noted     Vision Patient Visual Report: No change from baseline Vision Assessment?: No apparent visual deficits     Perception     Praxis      Pertinent Vitals/Pain Pain Assessment: No/denies pain     Hand Dominance     Extremity/Trunk Assessment Upper Extremity Assessment Upper Extremity Assessment: Overall WFL for tasks assessed   Lower Extremity Assessment Lower Extremity Assessment: Overall WFL for tasks assessed       Communication Communication Communication: No difficulties   Cognition Arousal/Alertness: Awake/alert Behavior During Therapy: WFL for tasks assessed/performed Overall Cognitive Status: Within Functional Limits for tasks assessed  General Comments: occasional word finding difficulty- but pt is aware of this and self corrects.   General Comments  HR up to 126 bpm A-fib.    Exercises     Shoulder Instructions      Home Living Family/patient expects to be discharged to:: Private residence Living Arrangements: Spouse/significant other;Children (son, daughter, granddaughter) Available Help at Discharge: Family;Available PRN/intermittently Type of Home: House Home Access: Level entry     Home Layout: Two level Alternate Level Stairs-Number of Steps: 1 flight Alternate Level Stairs-Rails: Right Bathroom Shower/Tub: Engineer, site: Standard     Home Equipment: Walker - 2 wheels          Prior Functioning/Environment Level of Independence: Independent        Comments: Drives, cooking, cleaning.  No falls.        OT Problem List: Decreased knowledge of use of DME or AE;Decreased knowledge of precautions;Decreased activity tolerance      OT Treatment/Interventions:      OT Goals(Current goals can be found in the care plan section) Acute Rehab OT Goals Patient Stated Goal: to go home OT Goal Formulation: All assessment and education complete, DC therapy  OT Frequency:     Barriers to D/C:            Co-evaluation              AM-PAC OT "6 Clicks" Daily Activity     Outcome Measure Help from another person eating meals?: None Help from another person taking care of personal grooming?: None Help from another person toileting, which includes using toliet, bedpan, or urinal?: None Help from another person bathing (including washing, rinsing, drying)?: None Help from another person to put on and taking off regular upper body clothing?: None Help from another person to put on and taking off regular lower body clothing?: None 6 Click Score: 24   End of Session Nurse Communication: Mobility status  Activity Tolerance: Patient tolerated treatment well Patient left: in bed;with call bell/phone within reach  OT Visit Diagnosis: Other abnormalities of gait and mobility (R26.89)                Time: [id number] OT Time Calculation (min): 12 min Charges:  OT General Charges $OT Visit: 1 Visit OT Evaluation $OT Eval Low Complexity: 1 Low  Zenovia Jarred, MSOT, OTR/L Acute Rehabilitation Services [facility] Office Number: [phone] Pager: [phone]  Zenovia Jarred 02/17/2020, 12:30 PM

## 2020-02-17 NOTE — Procedures (Signed)
Echo attempted. Patient on phone trying to cancel medical procedure scheduled for 02/18/20 per patient. Will attempt again later.

## 2020-02-17 NOTE — Plan of Care (Signed)
  Problem: Education: Goal: Knowledge of disease or condition will improve Outcome: Progressing Goal: Knowledge of secondary prevention will improve Outcome: Progressing Goal: Knowledge of patient specific risk factors addressed and post discharge goals established will improve Outcome: Progressing Goal: Individualized Educational Video(s) Outcome: Progressing   Problem: Nutrition: Goal: Risk of aspiration will decrease Outcome: Progressing Goal: Dietary intake will improve Outcome: Progressing   Problem: Ischemic Stroke/TIA Tissue Perfusion: Goal: Complications of ischemic stroke/TIA will be minimized Outcome: Progressing   Problem: Intracerebral Hemorrhage Tissue Perfusion: Goal: Complications of Intracerebral Hemorrhage will be minimized Outcome: Progressing   Problem: Coping: Goal: Will verbalize positive feelings about self Outcome: Progressing Goal: Will identify appropriate support needs Outcome: Progressing

## 2020-02-17 NOTE — Progress Notes (Signed)
William Norman  D/C'd Home per MD order.  Discussed with the patient and all questions fully answered.Patient verbalized understanding of D/C information given to him.  VSS, Skin clean, dry and intact without evidence of skin break down, no evidence of skin tears noted. IV catheter discontinued intact. Site without signs and symptoms of complications. Dressing and pressure applied.  An After Visit Summary was printed and given to the patient. Patient received prescription.  D/c education completed with patient/family including follow up instructions, medication list, d/c activities limitations if indicated, with other d/c instructions as indicated by MD - patient able to verbalize understanding, all questions fully answered.   Patient instructed to return to ED, call 911, or call MD for any changes in condition.   Patient escorted via WC, and D/C home via private auto.  [name] 02/17/2020 4:45 PM

## 2020-02-17 NOTE — TOC Benefit Eligibility Note (Signed)
Transition of Care [facility]) Benefit Eligibility Note    Patient Details  Name: William Norman MRN: [id number] Date of Birth: 10-08-1940   Medication/Dose: ENOXAPARIN 110 MG BID SYRINGES  Covered?: Yes  Tier:  (TIER - 4)  Prescription Coverage Preferred Pharmacy: [facility]  Spoke with Person/Company/Phone Number:: DIANNA  @ [employer]  [employer]D TC #  [phone]  Co-Pay: $100.00  Prior Approval: No  Deductible: Met  Additional Notes: LOVENOX : NON-FORMULARY and  NOT COVER  P/A-YES # [phone]    Memory Argue Phone Number: 02/17/2020, 3:56 PM

## 2020-02-17 NOTE — Discharge Summary (Signed)
Physician Discharge Summary  William Norman [id number] DOB: 1940-05-19 DOA: 02/16/2020  PCP: Vivi Barrack, MD  Admit date: 02/16/2020 Discharge date: 02/17/2020  Admitted From: Home  Disposition:  Home   Recommendations for Outpatient Follow-up:  1. Follow up with Cardiology on new Eliquis 2. Follow up with Urology as needed 3. Follow up with Neurology in 1 month      Home Health: None  Equipment/Devices: None  Discharge Condition: Good  CODE STATUS: FULL Diet recommendation: Cardiac  Brief/Interim Summary: Mr. Stanczak is a 80 y.o. M with HTN, Afib on warfarin, DVT, BPH, coronary disease and upcoming TURP who presented with acute aphasia.  Patient holding warfarin for upcoming TURP, had sudden onset difficulty getting words out and difficulty understanding people that lasted about 20 to 30 minutes, presented immediately to the ER.  In the ER CT head unremarkable.  The symptoms were resolved.     PRINCIPAL HOSPITAL DIAGNOSIS: Acute embolic stroke    Discharge Diagnoses:  Acute embolic stroke Patient was admitted, MRI showed small infarct in the perisylvian fissure, likely embolic in nature.  Neurology were consulted, the patient was started on heparin.  Transition to Eliquis at discharge.  -Non-invasive angiography showed no significant stenoses and no occlusions -Echocardiogram showed no cardiogenic source of embolism -Carotid imaging unremarkable   -Lipids ordered: discharged on atorvastatin -Aspirin ordered at admission --> discharged on new Eliquis -Atrial fibrillation: known, off warfarin for procedure -tPA not given because sypmtoms resolved -Dysphagia screen ordered in ER -PT eval ordered: recommended no follow up -Smoking cessation: not pertinent   Coronary artery disease Hypertension BPH History of DVT       Discharge Instructions  Discharge Instructions    Ambulatory referral to Neurology   Complete by: As directed    An  appointment is requested in approximately: 4 weeks   Diet - low sodium heart healthy   Complete by: As directed    Discharge instructions   Complete by: As directed    From Dr. Loleta Books: You were admited for an episode of difficulty speaking. On your MRI, we found that this was indeed from a small stroke in your "left posterior insula and parietal operculum" which is a fancy way of saying, "your speech centers of the brain."  The location of the stroke is also typical for "embolic" strokes, which are the kind most associated with atrial fibrillation.  I think this was just really bad luck that this happened.  Thankfully, your symptoms are improved so much today!  As you discussed with Dr. Leonie Man: a reasonable plan is to stop warfarin (which is so cumbersome) and start Eliquis  Take apixaban/Eliqiuis 5 mg twice daily from now on STOP warfarin  Follow up with your PCP or heart doctor in 1 week  Follow up with the Neurologist at [facility] in 1 month (this referral has been made, they will contact you, if you haven't heard from them in 1 week, call the number below)   Increase activity slowly   Complete by: As directed      Allergies as of 02/17/2020      Reactions   Other Other (See Comments)   Some form of numbing medication used by dentist, almost died   Simvastatin Other (See Comments)   Muscle aches      Medication List    STOP taking these medications   warfarin 5 MG tablet Commonly known as: COUMADIN     TAKE these medications   amLODipine 5 MG  tablet Commonly known as: NORVASC TAKE 1 TABLET BY MOUTH EVERY OTHER DAY   apixaban 5 MG Tabs tablet Commonly known as: ELIQUIS Take 1 tablet (5 mg total) by mouth 2 (two) times daily.   atenolol 100 MG tablet Commonly known as: TENORMIN TAKE 1 TABLET BY MOUTH EVERY DAY   atorvastatin 40 MG tablet Commonly known as: LIPITOR Take 0.5 tablets (20 mg total) by mouth daily.   CoQ10 200 MG Caps Take 1 capsule by  mouth daily.   [employer] Melatonin 5 MG Subl Generic drug: Melatonin Place 1 tablet under the tongue at bedtime.   hydroxypropyl methylcellulose / hypromellose 2.5 % ophthalmic solution Commonly known as: ISOPTO TEARS / GONIOVISC Place 1 drop into both eyes 4 (four) times daily as needed for dry eyes.   lisinopril 40 MG tablet Commonly known as: ZESTRIL TAKE 1 TABLET BY MOUTH EVERY DAY   nitroGLYCERIN 0.4 MG SL tablet Commonly known as: NITROSTAT Dissolve 1 tablet under the tongue every 5 minutes as needed   tamsulosin 0.4 MG Caps capsule Commonly known as: FLOMAX Take 0.4 mg by mouth in the morning and at bedtime.   Vitamin B-12 1000 MCG Subl Place 1 tablet under the tongue daily.       Follow-up Information    Vivi Barrack, MD. Schedule an appointment as soon as possible for a visit in 1 week(s).   Specialty: Family Medicine Contact information: [address] [place] [address] [phone]        [facility]. Go in 1 month(s).   Specialty: Neurology Contact information: [place] [phone]             Allergies  Allergen Reactions  . Other Other (See Comments)    Some form of numbing medication used by dentist, almost died  . Simvastatin Other (See Comments)    Muscle aches     Consultations:  Neurology   Procedures/Studies: CT ANGIO HEAD W OR WO CONTRAST  Result Date: 02/16/2020 CLINICAL DATA:  Stroke/TIA EXAM: CT ANGIOGRAPHY HEAD AND NECK TECHNIQUE: Multidetector CT imaging of the head and neck was performed using the standard protocol during bolus administration of intravenous contrast. Multiplanar CT image reconstructions and MIPs were obtained to evaluate the vascular anatomy. Carotid stenosis measurements (when applicable) are obtained utilizing NASCET criteria, using the distal internal carotid diameter as the denominator. CONTRAST:  5mL OMNIPAQUE IOHEXOL 350 MG/ML SOLN  COMPARISON:  None. FINDINGS: CTA NECK FINDINGS SKELETON: There is no bony spinal canal stenosis. No lytic or blastic lesion. OTHER NECK: Normal pharynx, larynx and major salivary glands. No cervical lymphadenopathy. Unremarkable thyroid gland. UPPER CHEST: Biapical emphysema. AORTIC ARCH: There is calcific atherosclerosis of the aortic arch. There is no aneurysm, dissection or hemodynamically significant stenosis of the visualized portion of the aorta. Conventional 3 vessel aortic branching pattern. The visualized proximal subclavian arteries are widely patent. RIGHT CAROTID SYSTEM: No dissection, occlusion or aneurysm. Mild atherosclerotic calcification at the carotid bifurcation without hemodynamically significant stenosis. LEFT CAROTID SYSTEM: No dissection, occlusion or aneurysm. Mild atherosclerotic calcification at the carotid bifurcation without hemodynamically significant stenosis. VERTEBRAL ARTERIES: Codominant configuration. Both vertebral artery origins are normal. There is no dissection, occlusion or flow-limiting stenosis to the skull base (V1-V3 segments). CTA HEAD FINDINGS POSTERIOR CIRCULATION: --Vertebral arteries: Normal V4 segments. --Inferior cerebellar arteries: Normal. --Basilar artery: Normal. --Superior cerebellar arteries: Normal. --Posterior cerebral arteries (PCA): Normal. ANTERIOR CIRCULATION: --Intracranial internal carotid arteries: Normal. --Anterior cerebral arteries (ACA): Normal.  Both A1 segments are present. Patent anterior communicating artery (a-comm). --Middle cerebral arteries (MCA): Normal. VENOUS SINUSES: As permitted by contrast timing, patent. ANATOMIC VARIANTS: Fetal origin of the left posterior cerebral artery. Review of the MIP images confirms the above findings. IMPRESSION: 1. No emergent large vessel occlusion or hemodynamically significant stenosis of the head or neck. Aortic Atherosclerosis (ICD10-I70.0) and Emphysema (ICD10-J43.9). Electronically Signed   By: Ulyses Jarred M.D.   On: 02/16/2020 22:33   CT HEAD WO CONTRAST  Result Date: 02/16/2020 CLINICAL DATA:  Transient ischemic attack. Expressive aphasia, left leg weakness, resolved prior to arrival. EXAM: CT HEAD WITHOUT CONTRAST TECHNIQUE: Contiguous axial images were obtained from the base of the skull through the vertex without intravenous contrast. COMPARISON:  None. FINDINGS: Brain: Diffuse cerebral atrophy. Mild ventricular dilatation consistent with central atrophy. Patchy low-attenuation changes throughout the deep white matter most consistent with small vessel ischemia. No mass-effect or midline shift. No abnormal extra-axial fluid collections. Gray-white matter junctions are distinct. Basal cisterns are not effaced. No acute intracranial hemorrhage. Vascular: No hyperdense vessel or unexpected calcification. Skull: Calvarium appears intact. Sinuses/Orbits: Retention cyst in the left maxillary antrum. Paranasal sinuses and mastoid air cells are otherwise clear. Other: None. IMPRESSION: 1. No acute intracranial abnormalities. 2. Chronic atrophy and small vessel ischemia. Electronically Signed   By: Lucienne Capers M.D.   On: 02/16/2020 16:47   CT ANGIO NECK W OR WO CONTRAST  Result Date: 02/16/2020 CLINICAL DATA:  Stroke/TIA EXAM: CT ANGIOGRAPHY HEAD AND NECK TECHNIQUE: Multidetector CT imaging of the head and neck was performed using the standard protocol during bolus administration of intravenous contrast. Multiplanar CT image reconstructions and MIPs were obtained to evaluate the vascular anatomy. Carotid stenosis measurements (when applicable) are obtained utilizing NASCET criteria, using the distal internal carotid diameter as the denominator. CONTRAST:  81mL OMNIPAQUE IOHEXOL 350 MG/ML SOLN COMPARISON:  None. FINDINGS: CTA NECK FINDINGS SKELETON: There is no bony spinal canal stenosis. No lytic or blastic lesion. OTHER NECK: Normal pharynx, larynx and major salivary glands. No cervical lymphadenopathy.  Unremarkable thyroid gland. UPPER CHEST: Biapical emphysema. AORTIC ARCH: There is calcific atherosclerosis of the aortic arch. There is no aneurysm, dissection or hemodynamically significant stenosis of the visualized portion of the aorta. Conventional 3 vessel aortic branching pattern. The visualized proximal subclavian arteries are widely patent. RIGHT CAROTID SYSTEM: No dissection, occlusion or aneurysm. Mild atherosclerotic calcification at the carotid bifurcation without hemodynamically significant stenosis. LEFT CAROTID SYSTEM: No dissection, occlusion or aneurysm. Mild atherosclerotic calcification at the carotid bifurcation without hemodynamically significant stenosis. VERTEBRAL ARTERIES: Codominant configuration. Both vertebral artery origins are normal. There is no dissection, occlusion or flow-limiting stenosis to the skull base (V1-V3 segments). CTA HEAD FINDINGS POSTERIOR CIRCULATION: --Vertebral arteries: Normal V4 segments. --Inferior cerebellar arteries: Normal. --Basilar artery: Normal. --Superior cerebellar arteries: Normal. --Posterior cerebral arteries (PCA): Normal. ANTERIOR CIRCULATION: --Intracranial internal carotid arteries: Normal. --Anterior cerebral arteries (ACA): Normal. Both A1 segments are present. Patent anterior communicating artery (a-comm). --Middle cerebral arteries (MCA): Normal. VENOUS SINUSES: As permitted by contrast timing, patent. ANATOMIC VARIANTS: Fetal origin of the left posterior cerebral artery. Review of the MIP images confirms the above findings. IMPRESSION: 1. No emergent large vessel occlusion or hemodynamically significant stenosis of the head or neck. Aortic Atherosclerosis (ICD10-I70.0) and Emphysema (ICD10-J43.9). Electronically Signed   By: Ulyses Jarred M.D.   On: 02/16/2020 22:33   MR BRAIN WO CONTRAST  Result Date: 02/16/2020 CLINICAL DATA:  Acute neuro deficit. Expressive aphasia left leg weakness  EXAM: MRI HEAD WITHOUT CONTRAST TECHNIQUE: Multiplanar,  multiecho pulse sequences of the brain and surrounding structures were obtained without intravenous contrast. COMPARISON:  CT head 02/16/2020 FINDINGS: Brain: Acute infarct in the left posterior insula and parietal operculum. This measures approximately 15 x 25 mm. No other areas of acute infarct. Negative for hemorrhage or mass. Scattered white matter hyperintensities compatible with chronic microvascular ischemia, moderate in degree. Negative for mass lesion. Vascular: Normal arterial flow voids Skull and upper cervical spine: No focal skeletal lesion. Sinuses/Orbits: Cyst left maxillary sinus. Mild mucosal edema in the paranasal sinuses. Bilateral cataract extraction. Other: None IMPRESSION: Acute infarct left posterior insula and parietal operculum without hemorrhage Moderate chronic microvascular ischemic change in the white matter. Electronically Signed   By: Marlan Palau M.D.   On: 02/16/2020 18:20   ECHOCARDIOGRAM COMPLETE BUBBLE STUDY  Result Date: 02/17/2020    ECHOCARDIOGRAM REPORT   Patient Name:   William Norman Date of Exam: 02/17/2020 Medical Rec #:  [id number]             Height:       72.0 in Accession #:    [id number]            Weight:       216.0 lb Date of Birth:  August 21, 1940            BSA:          2.202 m Patient Age:    79 years              BP:           141/81 mmHg Patient Gender: M                     HR:           73 bpm. Exam Location:  Inpatient Procedure: 2D Echo, Cardiac Doppler, Color Doppler and Saline Contrast Bubble            Study Indications:    Stroke  History:        Patient has prior history of Echocardiogram examinations, most                 recent 04/18/2018. CAD, Arrythmias:Atrial Fibrillation; Risk                 Factors:Hypertension and Dyslipidemia. H/O DVT.  Sonographer:    Ross Ludwig RDCS (AE) Referring Phys: [id number] AMY N [facility] IMPRESSIONS  1. Left ventricular ejection fraction, by estimation, is 60 to 65%. The left ventricle has normal function. The left  ventricle has no regional wall motion abnormalities. Left ventricular diastolic parameters are consistent with Grade I diastolic dysfunction (impaired relaxation).  2. Right ventricular systolic function is normal. The right ventricular size is normal. There is normal pulmonary artery systolic pressure.  3. Left atrial size was severely dilated.  4. The mitral valve is normal in structure. Trivial mitral valve regurgitation. No evidence of mitral stenosis.  5. The aortic valve is normal in structure. There is mild calcification of the aortic valve. There is mild thickening of the aortic valve. Aortic valve regurgitation is mild. No aortic stenosis is present.  6. The inferior vena cava is normal in size with greater than 50% respiratory variability, suggesting right atrial pressure of 3 mmHg.  7. Agitated saline contrast bubble study was negative, with no evidence of any interatrial shunt. FINDINGS  Left Ventricle: Left ventricular ejection fraction, by estimation, is 60 to 65%. The  left ventricle has normal function. The left ventricle has no regional wall motion abnormalities. The left ventricular internal cavity size was normal in size. There is  no left ventricular hypertrophy. Left ventricular diastolic parameters are consistent with Grade I diastolic dysfunction (impaired relaxation). Right Ventricle: The right ventricular size is normal. No increase in right ventricular wall thickness. Right ventricular systolic function is normal. There is normal pulmonary artery systolic pressure. The tricuspid regurgitant velocity is 2.14 m/s, and  with an assumed right atrial pressure of 3 mmHg, the estimated right ventricular systolic pressure is [id number] mmHg. Left Atrium: Left atrial size was severely dilated. Right Atrium: Right atrial size was normal in size. Pericardium: There is no evidence of pericardial effusion. Mitral Valve: The mitral valve is normal in structure. Mild mitral annular calcification. Trivial mitral  valve regurgitation. No evidence of mitral valve stenosis. Tricuspid Valve: The tricuspid valve is normal in structure. Tricuspid valve regurgitation is mild . No evidence of tricuspid stenosis. Aortic Valve: The aortic valve is normal in structure. There is mild calcification of the aortic valve. There is mild thickening of the aortic valve. Aortic valve regurgitation is mild. No aortic stenosis is present. Aortic valve mean gradient measures 2.3 mmHg. Aortic valve peak gradient measures 3.7 mmHg. Aortic valve area, by VTI measures 2.60 cm. Pulmonic Valve: The pulmonic valve was normal in structure. Pulmonic valve regurgitation is not visualized. No evidence of pulmonic stenosis. Aorta: The aortic root is normal in size and structure. Venous: The inferior vena cava is normal in size with greater than 50% respiratory variability, suggesting right atrial pressure of 3 mmHg. IAS/Shunts: No atrial level shunt detected by color flow Doppler. Agitated saline contrast was given intravenously to evaluate for intracardiac shunting. Agitated saline contrast bubble study was negative, with no evidence of any interatrial shunt. There  is no evidence of a patent foramen ovale. There is no evidence of an atrial septal defect.  LEFT VENTRICLE PLAX 2D LVIDd:         5.40 cm LVIDs:         3.60 cm LV PW:         1.24 cm LV IVS:        1.16 cm LVOT diam:     2.50 cm LV SV:         45 LV SV Index:   20 LVOT Area:     4.91 cm  RIGHT VENTRICLE             IVC RV Basal diam:  3.40 cm     IVC diam: 1.60 cm RV S prime:     14.90 cm/s TAPSE (M-mode): 2.3 cm LEFT ATRIUM              Index       RIGHT ATRIUM           Index LA diam:        4.90 cm  2.23 cm/m  RA Area:     18.20 cm LA Vol (A2C):   111.0 ml 50.42 ml/m RA Volume:   40.90 ml  18.58 ml/m LA Vol (A4C):   141.0 ml 64.04 ml/m LA Biplane Vol: 127.0 ml 57.68 ml/m  AORTIC VALVE AV Area (Vmax):    2.35 cm AV Area (Vmean):   2.10 cm AV Area (VTI):     2.60 cm AV Vmax:            96.00 cm/s AV Vmean:  69.267 cm/s AV VTI:            0.173 m AV Peak Grad:      3.7 mmHg AV Mean Grad:      2.3 mmHg LVOT Vmax:         46.00 cm/s LVOT Vmean:        29.700 cm/s LVOT VTI:          0.091 m LVOT/AV VTI ratio: 0.53  AORTA Ao Root diam: 3.50 cm Ao Asc diam:  3.40 cm TRICUSPID VALVE TR Peak grad:   18.3 mmHg TR Vmax:        214.00 cm/s  SHUNTS Systemic VTI:  0.09 m Systemic Diam: 2.50 cm Skeet Latch MD Electronically signed by Skeet Latch MD Signature Date/Time: 02/17/2020/11:58:10 AM    Final        Subjective: Feeling well.  No aphasia, focal weakness, numbness, speech deficits.  Discharge Exam: Vitals:   02/17/20 0753 02/17/20 1143  BP: 140/78 136/89  Pulse: 75 78  Resp: 20 18  Temp: (!) 97.3 F (36.3 C) 97.7 F (36.5 C)  SpO2: 96% 97%   Vitals:   02/17/20 0513 02/17/20 0543 02/17/20 0753 02/17/20 1143  BP:  (!) 141/81 140/78 136/89  Pulse:  73 75 78  Resp:  18 20 18   Temp: 98.2 F (36.8 C) 98.1 F (36.7 C) (!) 97.3 F (36.3 C) 97.7 F (36.5 C)  TempSrc: Oral Oral Oral Oral  SpO2:  96% 96% 97%  Weight:      Height:        General: Pt is alert, awake, not in acute distress Cardiovascular: RRR, nl S1-S2, no murmurs appreciated.   No LE edema.   Respiratory: Normal respiratory rate and rhythm.  CTAB without rales or wheezes. Abdominal: Abdomen soft and non-tender.  No distension or HSM.   Neuro/Psych: Strength symmetric in upper and lower extremities.  Judgment and insight appear normal.   The results of significant diagnostics from this hospitalization (including imaging, microbiology, ancillary and laboratory) are listed below for reference.     Microbiology: Recent Results (from the past 240 hour(s))  Resp Panel by RT-PCR (Flu A&B, Covid) Nasopharyngeal Swab     Status: None   Collection Time: 02/16/20 10:28 PM   Specimen: Nasopharyngeal Swab; Nasopharyngeal(NP) swabs in vial transport medium  Result Value Ref Range Status   SARS  Coronavirus 2 by RT PCR NEGATIVE NEGATIVE Final    Comment: (NOTE) SARS-CoV-2 target nucleic acids are NOT DETECTED.  The SARS-CoV-2 RNA is generally detectable in upper respiratory specimens during the acute phase of infection. The lowest concentration of SARS-CoV-2 viral copies this assay can detect is 138 copies/mL. A negative result does not preclude SARS-Cov-2 infection and should not be used as the sole basis for treatment or other patient management decisions. A negative result may occur with  improper specimen collection/handling, submission of specimen other than nasopharyngeal swab, presence of viral mutation(s) within the areas targeted by this assay, and inadequate number of viral copies(<138 copies/mL). A negative result must be combined with clinical observations, patient history, and epidemiological information. The expected result is Negative.  Fact Sheet for Patients:  EntrepreneurPulse.com.au  Fact Sheet for Healthcare Providers:  IncredibleEmployment.be  This test is no t yet approved or cleared by the [place] FDA and  has been authorized for detection and/or diagnosis of SARS-CoV-2 by FDA under an Emergency Use Authorization (EUA). This EUA will remain  in effect (meaning this test can be used)  for the duration of the COVID-19 declaration under Section 564(b)(1) of the Act, 21 U.S.C.section 360bbb-3(b)(1), unless the authorization is terminated  or revoked sooner.       Influenza A by PCR NEGATIVE NEGATIVE Final   Influenza B by PCR NEGATIVE NEGATIVE Final    Comment: (NOTE) The Xpert Xpress SARS-CoV-2/FLU/RSV plus assay is intended as an aid in the diagnosis of influenza from Nasopharyngeal swab specimens and should not be used as a sole basis for treatment. Nasal washings and aspirates are unacceptable for Xpert Xpress SARS-CoV-2/FLU/RSV testing.  Fact Sheet for  Patients: EntrepreneurPulse.com.au  Fact Sheet for Healthcare Providers: IncredibleEmployment.be  This test is not yet approved or cleared by the [place] FDA and has been authorized for detection and/or diagnosis of SARS-CoV-2 by FDA under an Emergency Use Authorization (EUA). This EUA will remain in effect (meaning this test can be used) for the duration of the COVID-19 declaration under Section 564(b)(1) of the Act, 21 U.S.C. section 360bbb-3(b)(1), unless the authorization is terminated or revoked.  Performed at [facility], [place] [address] [place], [place] [address]      Labs: BNP (last 3 results) No results for input(s): BNP in the last 8760 hours. Basic Metabolic Panel: Recent Labs  Lab 02/16/20 1458 02/16/20 1504  NA 138 140  K 4.4 4.4  CL 104 102  CO2 26  --   GLUCOSE 120* 114*  BUN 13 18  CREATININE 0.85 0.70  CALCIUM 9.7  --    Liver Function Tests: Recent Labs  Lab 02/16/20 1458  AST 32  ALT 23  ALKPHOS 53  BILITOT 1.4*  PROT 6.6  ALBUMIN 3.8   No results for input(s): LIPASE, AMYLASE in the last 168 hours. No results for input(s): AMMONIA in the last 168 hours. CBC: Recent Labs  Lab 02/16/20 1458 02/16/20 1504 02/17/20 0401  WBC 6.6  --  7.6  NEUTROABS 4.4  --   --   HGB 16.2 17.0 16.1  HCT 48.9 50.0 50.3  MCV 90.1  --  89.7  PLT 192  --  185   Cardiac Enzymes: No results for input(s): CKTOTAL, CKMB, CKMBINDEX, TROPONINI in the last 168 hours. BNP: Invalid input(s): POCBNP CBG: Recent Labs  Lab 02/16/20 1836  GLUCAP 119*   D-Dimer No results for input(s): DDIMER in the last 72 hours. Hgb A1c Recent Labs    02/17/20 0401  HGBA1C 5.5   Lipid Profile Recent Labs    02/17/20 0401  CHOL 143  HDL 48  LDLCALC 86  TRIG 45  CHOLHDL 3.0   Thyroid function studies No results for input(s): TSH, T4TOTAL, T3FREE, THYROIDAB in the last 72 hours.  Invalid input(s): FREET3 Anemia  work up No results for input(s): VITAMINB12, FOLATE, FERRITIN, TIBC, IRON, RETICCTPCT in the last 72 hours. Urinalysis    Component Value Date/Time   COLORURINE YELLOW 03/21/2014 0420   APPEARANCEUR CLEAR 03/21/2014 0420   LABSPEC 1.017 03/21/2014 0420   PHURINE 5.0 03/21/2014 0420   GLUCOSEU NEGATIVE 03/21/2014 0420   HGBUR SMALL (A) 03/21/2014 0420   HGBUR 1+ 02/13/2007 0000   BILIRUBINUR negative 09/23/2019 1527   BILIRUBINUR n 10/14/2014 1012   KETONESUR negative 09/23/2019 1527   KETONESUR NEGATIVE 03/21/2014 0420   PROTEINUR negative 09/23/2019 1527   PROTEINUR n 10/14/2014 1012   PROTEINUR NEGATIVE 03/21/2014 0420   UROBILINOGEN 0.2 09/23/2019 1527   UROBILINOGEN 0.2 03/21/2014 0420   NITRITE Negative 09/23/2019 1527   NITRITE n 10/14/2014 1012  NITRITE NEGATIVE 03/21/2014 0420   LEUKOCYTESUR Trace (A) 09/23/2019 1527   Sepsis Labs Invalid input(s): PROCALCITONIN,  WBC,  LACTICIDVEN Microbiology Recent Results (from the past 240 hour(s))  Resp Panel by RT-PCR (Flu A&B, Covid) Nasopharyngeal Swab     Status: None   Collection Time: 02/16/20 10:28 PM   Specimen: Nasopharyngeal Swab; Nasopharyngeal(NP) swabs in vial transport medium  Result Value Ref Range Status   SARS Coronavirus 2 by RT PCR NEGATIVE NEGATIVE Final    Comment: (NOTE) SARS-CoV-2 target nucleic acids are NOT DETECTED.  The SARS-CoV-2 RNA is generally detectable in upper respiratory specimens during the acute phase of infection. The lowest concentration of SARS-CoV-2 viral copies this assay can detect is 138 copies/mL. A negative result does not preclude SARS-Cov-2 infection and should not be used as the sole basis for treatment or other patient management decisions. A negative result may occur with  improper specimen collection/handling, submission of specimen other than nasopharyngeal swab, presence of viral mutation(s) within the areas targeted by this assay, and inadequate number of  viral copies(<138 copies/mL). A negative result must be combined with clinical observations, patient history, and epidemiological information. The expected result is Negative.  Fact Sheet for Patients:  EntrepreneurPulse.com.au  Fact Sheet for Healthcare Providers:  IncredibleEmployment.be  This test is no t yet approved or cleared by the [place] FDA and  has been authorized for detection and/or diagnosis of SARS-CoV-2 by FDA under an Emergency Use Authorization (EUA). This EUA will remain  in effect (meaning this test can be used) for the duration of the COVID-19 declaration under Section 564(b)(1) of the Act, 21 U.S.C.section 360bbb-3(b)(1), unless the authorization is terminated  or revoked sooner.       Influenza A by PCR NEGATIVE NEGATIVE Final   Influenza B by PCR NEGATIVE NEGATIVE Final    Comment: (NOTE) The Xpert Xpress SARS-CoV-2/FLU/RSV plus assay is intended as an aid in the diagnosis of influenza from Nasopharyngeal swab specimens and should not be used as a sole basis for treatment. Nasal washings and aspirates are unacceptable for Xpert Xpress SARS-CoV-2/FLU/RSV testing.  Fact Sheet for Patients: EntrepreneurPulse.com.au  Fact Sheet for Healthcare Providers: IncredibleEmployment.be  This test is not yet approved or cleared by the [place] FDA and has been authorized for detection and/or diagnosis of SARS-CoV-2 by FDA under an Emergency Use Authorization (EUA). This EUA will remain in effect (meaning this test can be used) for the duration of the COVID-19 declaration under Section 564(b)(1) of the Act, 21 U.S.C. section 360bbb-3(b)(1), unless the authorization is terminated or revoked.  Performed at [facility], [place] [address] [place], [place] [address]      Time coordinating discharge: 45 minutes      SIGNED:   Edwin Dada, MD  [facility] 02/17/2020, 7:44 PM

## 2020-02-17 NOTE — Progress Notes (Signed)
  Echocardiogram 2D Echocardiogram has been performed.  Gerda Diss 02/17/2020, 9:11 AM

## 2020-02-17 NOTE — Evaluation (Signed)
Physical Therapy Evaluation Patient Details Name: William Norman MRN: [id number] DOB: William Norman Today's Date: 02/17/2020   History of Present Illness  Patient is a 80 y/o male who presents with aphasia and LLE weakness. Head CT-unremarkable. MRI- left perisylvian acute infarct. PMH includes HTN, HLD, DVT, CAD, BPH.  Clinical Impression  Patient presents with word finding difficulties and impaired balance s/p above. Pt lives at home with wife and 2 adult children and reports being independent for ADls/IADLs and driving PTA. Today, pt tolerated transfers, gait training and stair training with Min guard assist-supervision for safety. Tolerated some higher level balance activities- stepping over objects and changes in direction with mild deviations in gait but no overt LOB. Noted to have worsening word finding difficulties with dual tasking. Will plan for negotiating 1 flight of stairs tomorrow prior to d/c. Will follow acutely to maximize independence, mobility and perform higher level balance challenges prior to return home.     Follow Up Recommendations No PT follow up;Supervision - Intermittent    Equipment Recommendations  None recommended by PT    Recommendations for Other Services       Precautions / Restrictions Precautions Precautions: Fall;Other (comment) Precaution Comments: watch HR Restrictions Weight Bearing Restrictions: No      Mobility  Bed Mobility Overal bed mobility: Modified Independent             General bed mobility comments: No assist needed.    Transfers Overall transfer level: Needs assistance Equipment used: None Transfers: Sit to/from Stand Sit to Stand: Supervision         General transfer comment: Stood from EOB x1 without difficulty; transferred to chair post ambulation.  Ambulation/Gait Ambulation/Gait assistance: Supervision;Min guard Gait Distance (Feet): 350 Feet Assistive device: None Gait Pattern/deviations: Step-through  pattern;Decreased stride length;Drifts right/left   Gait velocity interpretation: 1.31 - 2.62 ft/sec, indicative of limited community ambulator General Gait Details: Mildly unsteady gait with some drifting noted but no overt LOB. HR up to 126 bpm, asymptomatic.  Stairs            Wheelchair Mobility    Modified Rankin (Stroke Patients Only) Modified Rankin (Stroke Patients Only) Pre-Morbid Rankin Score: Slight disability Modified Rankin: Moderate disability     Balance Overall balance assessment: Needs assistance Sitting-balance support: Feet supported;No upper extremity supported Sitting balance-Leahy Scale: Good     Standing balance support: During functional activity Standing balance-Leahy Scale: Fair Standing balance comment: Supervision for safety               High Level Balance Comments: Able to step over objects and change in direction with only mild deviations in gait but no overt LOB.             Pertinent Vitals/Pain Pain Assessment: No/denies pain    Home Living Family/patient expects to be discharged to:: Private residence Living Arrangements: Spouse/significant other;Children (with son and daughter; gtranddaughter) Available Help at Discharge: Family;Available PRN/intermittently Type of Home: House Home Access: Level entry     Home Layout: Two level Home Equipment: Walker - 2 wheels      Prior Function Level of Independence: Independent         Comments: Drives, cooking, cleaning.  No falls.     Hand Dominance   Dominant Hand: Right    Extremity/Trunk Assessment   Upper Extremity Assessment Upper Extremity Assessment: Defer to OT evaluation    Lower Extremity Assessment Lower Extremity Assessment: Overall WFL for tasks assessed  Communication   Communication: No difficulties  Cognition Arousal/Alertness: Awake/alert Behavior During Therapy: WFL for tasks assessed/performed Overall Cognitive Status:  Impaired/Different from baseline                                 General Comments: word finding difficulties esp when walking.      General Comments General comments (skin integrity, edema, etc.): HR up to 126 bpm A-fib.    Exercises     Assessment/Plan    PT Assessment Patient needs continued PT services  PT Problem List Decreased mobility;Decreased balance       PT Treatment Interventions Therapeutic exercise;Gait training;Balance training;Stair training;Functional mobility training;Therapeutic activities;Patient/family education;Cognitive remediation    PT Goals (Current goals can be found in the Care Plan section)  Acute Rehab PT Goals Patient Stated Goal: to go home PT Goal Formulation: With patient Time For Goal Achievement: 03/02/20 Potential to Achieve Goals: Good    Frequency Min 3X/week   Barriers to discharge        Co-evaluation               AM-PAC PT "6 Clicks" Mobility  Outcome Measure Help needed turning from your back to your side while in a flat bed without using bedrails?: None Help needed moving from lying on your back to sitting on the side of a flat bed without using bedrails?: None Help needed moving to and from a bed to a chair (including a wheelchair)?: A Little Help needed standing up from a chair using your arms (e.g., wheelchair or bedside chair)?: A Little Help needed to walk in hospital room?: A Little Help needed climbing 3-5 steps with a railing? : A Little 6 Click Score: 20    End of Session Equipment Utilized During Treatment: Gait belt Activity Tolerance: Patient tolerated treatment well Patient left: in chair;with call bell/phone within reach;with chair alarm set Nurse Communication: Mobility status PT Visit Diagnosis: Unsteadiness on feet (R26.81)    Time: 8416-6063 PT Time Calculation (min) (ACUTE ONLY): 25 min   Charges:   PT Evaluation $PT Eval Moderate Complexity: 1 Mod PT Treatments $Gait  Training: 8-22 mins        William Norman, PT, DPT Acute Rehabilitation Services Pager [phone] Office [phone]      William Norman 02/17/2020, 9:48 AM

## 2020-02-17 NOTE — Progress Notes (Signed)
PT Cancellation Note  Patient Details Name: Valente Fosberg MRN: [id number] DOB: [birth year]   Cancelled Treatment:    Reason Eval/Treat Not Completed: Active bedrest order Will await increase in activity orders prior to PT evaluation. Will follow.   Blake Divine A Tanaya Dunigan 02/17/2020, 7:03 AM [place], PT, DPT Acute Rehabilitation Services Pager [phone] Office [phone]

## 2020-02-17 NOTE — Progress Notes (Signed)
ANTICOAGULATION CONSULT NOTE  Pharmacy Consult for Heparin Indication: atrial fibrillation  Allergies  Allergen Reactions  . Other Other (See Comments)    Some form of numbing medication used by dentist, almost died  . Simvastatin Other (See Comments)    Muscle aches     Patient Measurements: Height: 6' (182.9 cm) Weight: 98 kg (216 lb 0.8 oz) IBW/kg (Calculated) : 77.6 Heparin Dosing Weight: 97.3 mg  Vital Signs: Temp: 97.9 F (36.6 C) (01/02 1606) Temp Source: Oral (01/02 1606) BP: 132/89 (01/03 0200) Pulse Rate: 76 (01/03 0200)  Labs: Recent Labs    02/16/20 1458 02/16/20 1504 02/17/20 0200  HGB 16.2 17.0  --   HCT 48.9 50.0  --   PLT 192  --   --   APTT 27  --   --   LABPROT 13.2  --   --   INR 1.0  --   --   HEPARINUNFRC  --   --  1.00*  CREATININE 0.85 0.70  --     Estimated Creatinine Clearance: 89.4 mL/min (by C-G formula based on SCr of 0.7 mg/dL).   Medical History: Past Medical History:  Diagnosis Date  . BPH (benign prostatic hypertrophy)   . BPH associated with nocturia 08/29/2013  . BPH with obstruction/lower urinary tract symptoms 08/14/2019  . CAD (coronary artery disease)   . CAD (coronary artery disease), native coronary artery    PTCA of RCA 1986, PTCA of LAD 1992,  Negative treadmill Cardiolite 2011   . Chronic dermatitis of hands 10/12/2015  . Chronic venous insufficiency   . Coagulation disorder (HCC) 10/26/2018  . DVT (deep venous thrombosis) (HCC)   . Dyslipidemia   . Eczema   . Erectile dysfunction 07/11/2012  . Essential hypertension   . GERD 08/04/2006      . GERD (gastroesophageal reflux disease)   . Hyperglycemia 07/08/2019  . Hyperlipidemia   . Hypertension   . Incomplete emptying of bladder 08/14/2019  . Long term current use of anticoagulant therapy   . Lumbar disc disease   . Overweight (BMI 25.0-29.9) 06/18/2015  . Persistent atrial fibrillation (HCC) 04/10/2018  . Personal history of DVT (deep vein thrombosis)     Initially in 1998 with recurrence in 2002 now on chronic warfarin   . Soft tissue lesion of foot 08/27/2015  . Urinary tract infection with hematuria 08/14/2019    Medications:  Scheduled:  .  stroke: mapping our early stages of recovery book   Does not apply Once  . atorvastatin  40 mg Oral Daily  . tamsulosin  0.8 mg Oral QPC supper    Assessment Patient is a 62 yom that is being admitted for a TIA/CVA workup. Patient is normally on warfarin for afib however is being held for a turp. Pharmacy has been asked to dose heparin at this time for afib with concern for a cva.  1/3 AM update:  Heparin level elevated  No issues per RN  Goal of Therapy:  Heparin level 0.3-0.5 units/ml Monitor platelets by anticoagulation protocol: Yes   Plan:  -Hold heparin x 1 hr -Re-start heparin drip at 1150 units/hr at 0500 -1300 heparin level  Abran Duke, PharmD, BCPS Clinical Pharmacist Phone: [phone]

## 2020-02-17 NOTE — Progress Notes (Addendum)
STROKE TEAM PROGRESS NOTE  HPI per record : William Norman is a 80 y.o. male with medical history significant for hypertension, atrial fibrillation on warfarin, history of DVT, BPH, dyslipidemia, CAD, presented to the emergency department for chief concerns of sudden onset difficulty speaking. He states that he was not able to converse, approximately 1-2 pm on 02/16/19. He states he had difficulty getting the word out and denies difficulty understanding other people speaking. This lasted approximately 20-30 minutes. He denies experiencing these symptoms before. He denies extremity weakness or tingling. He denies difficulty walking, vision changes, shortness of breath, chest pain, nausea, vomiting, difficulty swallowing, pain with swallowing. He denies seizure activities. He endorses baseline tremors when he is trying to initiate activities like working on computers and holding a glass of water. His last dose of warfarin was Wednesday, 02/12/20 in anticipation of cystoscopy with TURP on 02/18/2020.    INTERVAL HISTORY No acute overnight events. Patient evaluated at bedside this morning. No family at the bedside. He states he feels well today and his speech is back to normal. States he was off his warfarin due to his upcoming TURP procedure. Patient states does not need emergent cystoscopy and would wait 6 months to re-schedule the procedure. Discussion about starting Eliquis ( rather than Coumadin) and patient agrees to the management.   Objective: Blood pressure adequately controlled. Neurological exam is stable. PT/OT does not suggest any follow up except intermittent supervision.  Echo does not show any PFO or ASD or shunt.  Vitals:   02/17/20 0513 02/17/20 0543 02/17/20 0753 02/17/20 1143  BP:  (!) 141/81 140/78 136/89  Pulse:  73 75 78  Resp:  18 20 18   Temp: 98.2 F (36.8 C) 98.1 F (36.7 C) (!) 97.3 F (36.3 C) 97.7 F (36.5 C)  TempSrc: Oral Oral Oral Oral  SpO2:  96% 96% 97%   Weight:      Height:       CBC:  Recent Labs  Lab 02/16/20 1458 02/16/20 1504 02/17/20 0401  WBC 6.6  --  7.6  NEUTROABS 4.4  --   --   HGB 16.2 17.0 16.1  HCT 48.9 50.0 50.3  MCV 90.1  --  89.7  PLT 192  --  [id number]   Basic Metabolic Panel:  Recent Labs  Lab 02/16/20 1458 02/16/20 1504  NA 138 140  K 4.4 4.4  CL 104 102  CO2 26  --   GLUCOSE 120* 114*  BUN 13 18  CREATININE 0.85 0.70  CALCIUM 9.7  --    Lipid Panel:  Recent Labs  Lab 02/17/20 0401  CHOL 143  TRIG 45  HDL 48  CHOLHDL 3.0  VLDL 9  LDLCALC 86   HgbA1c:  Recent Labs  Lab 02/17/20 0401  HGBA1C 5.5   Urine Drug Screen: No results for input(s): LABOPIA, COCAINSCRNUR, LABBENZ, AMPHETMU, THCU, LABBARB in the last 168 hours.  Alcohol Level No results for input(s): ETH in the last 168 hours.  IMAGING past 24 hours  CTA Head and Neck  CTA HEAD FINDINGS  POSTERIOR CIRCULATION:  --Vertebral arteries: Normal V4 segments.  --Inferior cerebellar arteries: Normal.  --Basilar artery: Normal.  --Superior cerebellar arteries: Normal.  --Posterior cerebral arteries (PCA): Normal.  ANTERIOR CIRCULATION:  --Intracranial internal carotid arteries: Normal.  --Anterior cerebral arteries (ACA): Normal. Both A1 segments are present. Patent anterior communicating artery (a-comm).  --Middle cerebral arteries (MCA): Normal.  VENOUS SINUSES: As permitted by contrast timing, patent.  ANATOMIC VARIANTS: Fetal  origin of the left posterior cerebral artery.  Review of the MIP images confirms the above findings.   CTA NECK FINDINGS  SKELETON: There is no bony spinal canal stenosis. No lytic or blastic lesion.  OTHER NECK: Normal pharynx, larynx and major salivary glands. No cervical lymphadenopathy. Unremarkable thyroid gland.  UPPER CHEST: Biapical emphysema.  AORTIC ARCH:  There is calcific atherosclerosis of the aortic arch. There is no aneurysm, dissection or  hemodynamically significant stenosis of the visualized portion of the aorta. Conventional 3 vessel aortic branching pattern. The visualized proximal subclavian arteries are widely patent.  RIGHT CAROTID SYSTEM: No dissection, occlusion or aneurysm. Mild atherosclerotic calcification at the carotid bifurcation without hemodynamically significant stenosis.  LEFT CAROTID SYSTEM: No dissection, occlusion or aneurysm. Mild atherosclerotic calcification at the carotid bifurcation without hemodynamically significant stenosis.  VERTEBRAL ARTERIES: Codominant configuration. Both vertebral artery origins are normal. There is no dissection, occlusion or flow-limiting stenosis to the skull base (V1-V3 segments).   IMPRESSION: 1. No emergent large vessel occlusion or hemodynamically significant stenosis of the head or neck.  Aortic Atherosclerosis (ICD10-I70.0) and Emphysema (ICD10-J43.9).  MRI head wo Contrast  FINDINGS: Brain: Acute infarct in the left posterior insula and parietal operculum. This measures approximately 15 x 25 mm. No other areas of acute infarct. Negative for hemorrhage or mass.  Scattered white matter hyperintensities compatible with chronic microvascular ischemia, moderate in degree. Negative for mass lesion.  Vascular: Normal arterial flow voids  Skull and upper cervical spine: No focal skeletal lesion.  Sinuses/Orbits: Cyst left maxillary sinus. Mild mucosal edema in the paranasal sinuses. Bilateral cataract extraction.  Other: None  IMPRESSION: Acute infarct left posterior insula and parietal operculum without hemorrhage  Moderate chronic microvascular ischemic change in the white matter.  CT HEAD WITHOUT CONTRAST   COMPARISON:  None.  FINDINGS: Brain: Diffuse cerebral atrophy. Mild ventricular dilatation consistent with central atrophy. Patchy low-attenuation changes throughout the deep white matter most consistent with small  vessel ischemia. No mass-effect or midline shift. No abnormal extra-axial fluid collections. Gray-white matter junctions are distinct. Basal cisterns are not effaced. No acute intracranial hemorrhage.  Vascular: No hyperdense vessel or unexpected calcification.  Skull: Calvarium appears intact.  Sinuses/Orbits: Retention cyst in the left maxillary antrum. Paranasal sinuses and mastoid air cells are otherwise clear.  Other: None.  IMPRESSION: 1. No acute intracranial abnormalities. 2. Chronic atrophy and small vessel ischemia.  Echo complete Bubble study  IMPRESSIONS    1. Left ventricular ejection fraction, by estimation, is 60 to 65%. The  left ventricle has normal function. The left ventricle has no regional  wall motion abnormalities. Left ventricular diastolic parameters are  consistent with Grade I diastolic  dysfunction (impaired relaxation).  2. Right ventricular systolic function is normal. The right ventricular  size is normal. There is normal pulmonary artery systolic pressure.  3. Left atrial size was severely dilated.  4. The mitral valve is normal in structure. Trivial mitral valve  regurgitation. No evidence of mitral stenosis.  5. The aortic valve is normal in structure. There is mild calcification  of the aortic valve. There is mild thickening of the aortic valve. Aortic  valve regurgitation is mild. No aortic stenosis is present.  6. The inferior vena cava is normal in size with greater than 50%  respiratory variability, suggesting right atrial pressure of 3 mmHg.  7. Agitated saline contrast bubble study was negative, with no evidence  of any interatrial shunt.    PHYSICAL EXAM  General: Well developed, well  nourished, elderly male lying comfortably in bed, NAD HEENT: [place]/AT CV: Normal rate and rhythm Psych: Normal mood and affect, normal thought content and judgement   Mental Status: Alert, oriented, thought content appropriate.   Speech fluent without evidence of aphasia.  Able to follow 3 step commands without difficulty. Cranial Nerves: II:  Visual fields grossly normal III,IV, VI: ptosis not present, extra-ocular motions intact bilaterally,  pupils equal, round, reactive to light and accommodation V,VII: smile symmetric, facial light touch sensation normal bilaterally VIII: hearing normal bilaterally IX,X: uvula rises symmetrically XI: bilateral shoulder shrug XII: midline tongue extension without atrophy or fasciculations  Motor: Right : Upper extremity   5/5    Left:     Upper extremity   5/5  Lower extremity   5/5     Lower extremity   5/5 Tone and bulk:normal tone throughout; no atrophy noted Sensory: Light touch intact throughout, bilaterally Cerebellar: normal finger-to-nose,  normal heel-to-shin test Gait: deferred   ASSESSMENT/PLAN Mr. William Norman is a 80 y.o. male with PMHx of HTN, Afib on Warfarin, h/o DVT, BPH, HLD, CAD presented [facility] ED after an episode of transient aphasia at home. This admission he was found to have an acute infarct to the left posterior insula and parietal operculum.His stroke etiology is felt to be in the setting of atrial fibrillation due to being off of anticoagulation that was being held for an outpatient cystoscopy. He takes Warfarin at home for [facility]    Stroke: Left posterior insula and parietal operculum   CT head: No acute intracranial abnormalities. Chronic atrophy and small vessel ischemia.  CTA head - No emergent large vessel occlusion or hemodynamically significant stenosis of the head   CTA neck - No emergent large vessel occlusion or hemodynamically significant  stenosis of the neck.  CT perfusion - not ordered  MRI - Acute infarct left posterior insula and parietal operculum without hemorrhage. Moderate chronic microvascular ischemic change in the white matter.  Echo complete Bubble study: Agitated saline contrast bubble study was negative, with  no evidence of any interatrial shunt.  LDL 86  HgbA1c 5.5  VTE prophylaxis - SCD's      Diet   Diet Heart Room service appropriate? Yes; Fluid consistency: Thin    warfarin daily prior to admission, was transitioned to Eliquis (apixaban) daily this admission.  Therapy recommendations:  No PT follow up, Supervision-Intermittent   Disposition:  Likely home   Hypertension  Home meds:  Amlodipine, Atenolol, Lisniopril  Stable . Permissive hypertension (OK if < 220/120) but gradually normalize in 5-7 days . Long-term BP goal normotensive  Hyperlipidemia  Home meds: Atorvastatin 40 mg resumed in hospital  LDL 86, goal < 70  Continue statin at discharge  Other Stroke Risk Factors  Advanced Age >/= 4   Coronary artery disease   Hospital day # 0 I have personally obtained history,examined this patient, reviewed notes, independently viewed imaging studies, participated in medical decision making and plan of care.ROS completed by me personally and pertinent positives fully documented  I have made any additions or clarifications directly to the above note. Agree with note above.  Patient presented with transient episode of expressive aphasia in the setting of anticoagulation with warfarin and being on hold for elective surgical procedure.  MRI scan shows small left MCA branch infarct and CT angiogram did not show large vessel stenosis or occlusion.  Continue ongoing stroke work-up.  Recommend changing warfarin to Eliquis for better compliance for stroke prevention.  Aggressive risk factor modification.  Patient has elected to postpone his elective urological procedure for 6 months.  Greater than 50% time during the 35-minute visit was spent in counseling and coordination of care about his embolic stroke discussion about stroke risk from atrial fibrillation holding anticoagulation from questions.  Discussed with Dr. Loleta Books. Stroke team will sign off.  Kindly call for  questions. Antony Contras, MD Medical Director [facility] Pager: [phone] 02/17/2020 4:26 PM   To contact Stroke Continuity provider, please refer to [email or web address] After hours, contact General Neurology

## 2020-02-17 NOTE — ED Notes (Signed)
Report attempted to 3W

## 2020-02-19 ENCOUNTER — Telehealth: Payer: Self-pay | Admitting: Family Medicine

## 2020-02-19 NOTE — Telephone Encounter (Signed)
Pt states he had a heart attack on Sunday.  He said the doctors have taken him off of his Coumadin medication and he wants me to pass that information along to [place].

## 2020-02-20 NOTE — Progress Notes (Signed)
Cardiology Office Note:    Date:  02/21/2020   ID:  William Norman, DOB 1940-09-30, MRN [id number]  PCP:  [name], MD  Cardiologist:  Norman Herrlich, MD    Referring MD: [name], MD    ASSESSMENT:    1. Persistent atrial fibrillation (HCC)   2. Long term current use of anticoagulant therapy   3. Hypertensive heart disease without heart failure   4. Coronary artery disease involving native coronary artery of native heart without angina pectoris   5. Mixed hyperlipidemia    PLAN:    In order of problems listed above:  1. Stable rate is controlled continue beta-blocker and long-term anticoagulation with Eliquis.  I told him it is vitally important if anybody recommends withdrawal of anticoagulant to contact me.  If not a good candidate for long-term anticoagulation left atrial occlusion would be an option. 2. Stable hypertension no evidence of heart failure presently not on a loop diuretic BP at target continue current therapy ACE inhibitor beta-blocker calcium channel blocker 3. Stable CAD no anginal discomfort continue current therapy including high intensity statin 4. Stable continue with statin with ideal lipid profile   Next appointment: 6 months   Medication Adjustments/Labs and Tests Ordered: Current medicines are reviewed at length with the patient today.  Concerns regarding medicines are outlined above.  No orders of the defined types were placed in this encounter.  No orders of the defined types were placed in this encounter.   Chief Complaint  Patient presents with  . Follow-up  . Atrial Fibrillation    And recent stroke in the setting of warfarin withdrawal    History of Present Illness:    William Norman is a 80 y.o. male with a hx of CAD remote PCI of the LAD in 1992, history of deep vein thrombosis on chronic anticoagulant therapy hypertension hyperlipidemia and persistent atrial fibrillation  12/03/2019 last seen. Compliance  with diet, lifestyle and medications: Yes  Following records were reviewed for this visit  He had a recent stroke with warfarin withdrawal for TURP. Acute embolic stroke 02/16/2019 Patient was admitted, MRI showed small infarct in the perisylvian fissure, likely embolic in nature.  Neurology were consulted, the patient was started on heparin.   Transition to Eliquis at discharge.   -Non-invasive angiography showed no significant stenoses and no occlusions -Echocardiogram showed no cardiogenic source of embolism -Carotid imaging unremarkable   -Lipids ordered: discharged on atorvastatin -Aspirin ordered at admission --> discharged on new Eliquis -Atrial fibrillation: known, off warfarin for procedure -tPA not given because sypmtoms resolved -Dysphagia screen ordered in ER -PT eval ordered: recommended no follow up -Smoking cessation: not pertinent  He has made a good recovery from his stroke and is taking his anticoagulant.  He has no plans for prostate surgery.  I have told him in the future anybody talks about withdrawing anticoagulants to be in touch with me encouraged him to stay on Eliquis for better anticoagulant than warfarin and if anticoagulation is not a good long-term strategy would consider left atrial occlusion watchman procedure.  No angina dyspnea palpitation or syncope Past Medical History:  Diagnosis Date  . BPH (benign prostatic hypertrophy)   . BPH associated with nocturia 08/29/2013  . BPH with obstruction/lower urinary tract symptoms 08/14/2019  . CAD (coronary artery disease)   . CAD (coronary artery disease), native coronary artery    PTCA of RCA 1986, PTCA of LAD 1992,  Negative treadmill Cardiolite 2011   .  Chronic dermatitis of hands 10/12/2015  . Chronic venous insufficiency   . Coagulation disorder ([place]) 10/26/2018  . DVT (deep venous thrombosis) ([place])   . Dyslipidemia   . Eczema   . Erectile dysfunction 07/11/2012  . Essential hypertension   . GERD  08/04/2006      . GERD (gastroesophageal reflux disease)   . Hyperglycemia 07/08/2019  . Hyperlipidemia   . Hypertension   . Incomplete emptying of bladder 08/14/2019  . Long term current use of anticoagulant therapy   . Lumbar disc disease   . Overweight (BMI 25.0-29.9) 06/18/2015  . Persistent atrial fibrillation ([place]) 04/10/2018  . Personal history of DVT (deep vein thrombosis)    Initially in 1998 with recurrence in 2002 now on chronic warfarin   . Soft tissue lesion of foot 08/27/2015  . Urinary tract infection with hematuria 08/14/2019    Past Surgical History:  Procedure Laterality Date  . CARDIAC CATHETERIZATION    . CARPAL TUNNEL RELEASE  04/21/2011   Procedure: CARPAL TUNNEL RELEASE;  Surgeon: Tennis Must, MD;  Location: [facility];  Service: Orthopedics;  Laterality: Left;  . CARPAL TUNNEL RELEASE  05/23/2011   Procedure: CARPAL TUNNEL RELEASE;  Surgeon: Tennis Must, MD;  Location: [facility];  Service: Orthopedics;  Laterality: Right;  . COLON SURGERY  2010 and 2011 colostomy reversal  . CORONARY ANGIOPLASTY    . EYE SURGERY     repair of macular hole  . NASAL SEPTUM SURGERY    . precutaneous transluminal coronary angioplasty    . right hernia    . TONSILLECTOMY AND ADENOIDECTOMY      Current Medications: Current Meds  Medication Sig  . amLODipine (NORVASC) 5 MG tablet TAKE 1 TABLET BY MOUTH EVERY OTHER DAY  . apixaban (ELIQUIS) 5 MG TABS tablet Take 1 tablet (5 mg total) by mouth 2 (two) times daily.  Marland Kitchen atenolol (TENORMIN) 100 MG tablet TAKE 1 TABLET BY MOUTH EVERY DAY  . atorvastatin (LIPITOR) 40 MG tablet Take 0.5 tablets (20 mg total) by mouth daily.  . Coenzyme Q10 (COQ10) 200 MG CAPS Take 1 capsule by mouth daily.   . Cyanocobalamin (VITAMIN B-12) 1000 MCG SUBL Place 1 tablet under the tongue daily.  . hydroxypropyl methylcellulose / hypromellose (ISOPTO TEARS / GONIOVISC) 2.5 % ophthalmic solution Place 1 drop into both eyes 4 (four)  times daily as needed for dry eyes.  Marland Kitchen lisinopril (ZESTRIL) 40 MG tablet TAKE 1 TABLET BY MOUTH EVERY DAY  . nitroGLYCERIN (NITROSTAT) 0.4 MG SL tablet Dissolve 1 tablet under the tongue every 5 minutes as needed  . tamsulosin (FLOMAX) 0.4 MG CAPS capsule Take 0.4 mg by mouth in the morning and at bedtime.     Allergies:   Other and Simvastatin   Social History   Socioeconomic History  . Marital status: Married    Spouse name: Not on file  . Number of children: 3  . Years of education: 66  . Highest education level: Not on file  Occupational History  . Occupation: Retired  Tobacco Use  . Smoking status: Former Smoker    Packs/day: 1.00    Years: 60.00    Pack years: 60.00    Types: Cigarettes    Quit date: 03/25/1998    Years since quitting: 21.9  . Smokeless tobacco: Never Used  Vaping Use  . Vaping Use: Never used  Substance and Sexual Activity  . Alcohol use: Yes    Alcohol/week: 7.0 standard  drinks    Types: 7 Glasses of wine per week    Comment: 7 glasses of red wine per week   . Drug use: No  . Sexual activity: Not on file  Other Topics Concern  . Not on file  Social History Narrative   Fun: Working outside in yard, cutting wood    Denies abuse and feels safe at home.    Social Determinants of Health   Financial Resource Strain: Not on file  Food Insecurity: Not on file  Transportation Needs: Not on file  Physical Activity: Not on file  Stress: Not on file  Social Connections: Not on file     Family History: The patient's family history includes AAA (abdominal aortic aneurysm) in his brother; Atrial fibrillation in his father and mother; Heart attack in his father; Heart disease in his father and mother; Stroke in his mother. ROS:   Please see the history of present illness.    All other systems reviewed and are negative.  EKGs/Labs/Other Studies Reviewed:    The following studies were reviewed today:    Recent Labs: 09/30/2019: TSH 2.86 02/16/2020:  ALT 23; BUN 18; Creatinine, Ser 0.70; Potassium 4.4; Sodium 140 02/17/2020: Hemoglobin 16.1; Platelets 185  Recent Lipid Panel    Component Value Date/Time   CHOL 143 02/17/2020 0401   CHOL 130 12/03/2019 1509   TRIG 45 02/17/2020 0401   TRIG 68 01/10/2006 1108   HDL 48 02/17/2020 0401   HDL 48 12/03/2019 1509   CHOLHDL 3.0 02/17/2020 0401   VLDL 9 02/17/2020 0401   LDLCALC 86 02/17/2020 0401   LDLCALC 66 12/03/2019 1509   LDLDIRECT 131.5 04/22/2010 1018    Physical Exam:    VS:  BP 130/82   Pulse 90   Ht 6' (1.829 m)   Wt 215 lb (97.5 kg)   SpO2 96%   BMI 29.16 kg/m     Wt Readings from Last 3 Encounters:  02/21/20 215 lb (97.5 kg)  02/16/20 216 lb 0.8 oz (98 kg)  12/03/19 211 lb (95.7 kg)     GEN:  Well nourished, well developed in no acute distress HEENT: Normal NECK: No JVD; No carotid bruits LYMPHATICS: No lymphadenopathy CARDIAC: Irregular rhythm variable first heart sound  no murmurs, rubs, gallops RESPIRATORY:  Clear to auscultation without rales, wheezing or rhonchi  ABDOMEN: Soft, non-tender, non-distended MUSCULOSKELETAL:  No edema; No deformity  SKIN: Warm and dry NEUROLOGIC:  Alert and oriented x 3 PSYCHIATRIC:  Normal affect    Signed, Shirlee More, MD  02/21/2020 10:25 AM    [place]

## 2020-02-21 ENCOUNTER — Other Ambulatory Visit: Payer: Self-pay

## 2020-02-21 ENCOUNTER — Ambulatory Visit: Payer: Medicare HMO | Admitting: Cardiology

## 2020-02-21 ENCOUNTER — Encounter: Payer: Self-pay | Admitting: Cardiology

## 2020-02-21 VITALS — BP 130/82 | HR 90 | Ht 72.0 in | Wt 215.0 lb

## 2020-02-21 DIAGNOSIS — I4819 Other persistent atrial fibrillation: Secondary | ICD-10-CM | POA: Diagnosis not present

## 2020-02-21 DIAGNOSIS — I119 Hypertensive heart disease without heart failure: Secondary | ICD-10-CM

## 2020-02-21 DIAGNOSIS — Z7901 Long term (current) use of anticoagulants: Secondary | ICD-10-CM

## 2020-02-21 DIAGNOSIS — I2510 Atherosclerotic heart disease of native coronary artery without angina pectoris: Secondary | ICD-10-CM | POA: Diagnosis not present

## 2020-02-21 DIAGNOSIS — E782 Mixed hyperlipidemia: Secondary | ICD-10-CM

## 2020-02-21 NOTE — Patient Instructions (Signed)

## 2020-02-24 ENCOUNTER — Encounter: Payer: Self-pay | Admitting: Family Medicine

## 2020-02-24 ENCOUNTER — Ambulatory Visit (INDEPENDENT_AMBULATORY_CARE_PROVIDER_SITE_OTHER): Payer: Medicare HMO | Admitting: Family Medicine

## 2020-02-24 ENCOUNTER — Other Ambulatory Visit: Payer: Self-pay

## 2020-02-24 ENCOUNTER — Ambulatory Visit: Payer: Self-pay | Admitting: General Practice

## 2020-02-24 VITALS — BP 128/80 | HR 61 | Temp 97.7°F | Ht 72.0 in | Wt 216.0 lb

## 2020-02-24 DIAGNOSIS — B070 Plantar wart: Secondary | ICD-10-CM | POA: Diagnosis not present

## 2020-02-24 DIAGNOSIS — N401 Enlarged prostate with lower urinary tract symptoms: Secondary | ICD-10-CM | POA: Diagnosis not present

## 2020-02-24 DIAGNOSIS — I4819 Other persistent atrial fibrillation: Secondary | ICD-10-CM | POA: Diagnosis not present

## 2020-02-24 DIAGNOSIS — M5137 Other intervertebral disc degeneration, lumbosacral region: Secondary | ICD-10-CM | POA: Diagnosis not present

## 2020-02-24 DIAGNOSIS — R351 Nocturia: Secondary | ICD-10-CM | POA: Diagnosis not present

## 2020-02-24 DIAGNOSIS — I6300 Cerebral infarction due to thrombosis of unspecified precerebral artery: Secondary | ICD-10-CM

## 2020-02-24 DIAGNOSIS — Z8673 Personal history of transient ischemic attack (TIA), and cerebral infarction without residual deficits: Secondary | ICD-10-CM

## 2020-02-24 NOTE — Assessment & Plan Note (Signed)
Rate controlled.  Anticoagulated on Eliquis. 

## 2020-02-24 NOTE — Patient Instructions (Signed)
It was very nice to see you today!  I am glad that you are doing better.  I will send you to see a sports medicine doctor for your right lower back.  Please see your foot doctor about the wart on her foot.  Take care, Dr [name]  Please try these tips to maintain a healthy lifestyle:   Eat at least 3 REAL meals and 1-2 snacks per day.  Aim for no more than 5 hours between eating.  If you eat breakfast, please do so within one hour of getting up.    Each meal should contain half fruits/vegetables, one quarter protein, and one quarter carbs (no bigger than a computer mouse)   Cut down on sweet beverages. This includes juice, soda, and sweet tea.     Drink at least 1 glass of water with each meal and aim for at least 8 glasses per day   Exercise at least 150 minutes every week.

## 2020-02-24 NOTE — Assessment & Plan Note (Signed)
He has essentially recovered with no ongoing impairment.  We will continue Eliquis per neurology.

## 2020-02-24 NOTE — Progress Notes (Signed)
William Norman is a 80 y.o. male who presents today for an office visit.  Assessment/Plan:  New/Acute Problems: Plantar Wart Offered cryotherapy however patient declined.  He will follow-up with podiatry soon.  Chronic Problems Addressed Today: Lumbar disc disease Will place referral to sports medicine.  No red flags.  Stroke [facility]) He has essentially recovered with no ongoing impairment.  We will continue Eliquis per neurology.   Persistent atrial fibrillation Rate controlled.  Anticoagulated on Eliquis.  BPH associated with nocturia He has been planning to have TURP but this was canceled due to his stroke last week.  He wishes to hold off on surgery for now.     Subjective:  HPI:  Patient here for hospital follow-up.  Presented to the hospital on 02/16/2020 with acute aphasia.  Admitted for stroke work-up.  MRI showed small infarct nature.  He was holding warfarin due to upcoming urologic procedure and there was concern for possible embolic source.  Rest of his stroke work-up including CTA neck and echocardiogram were within normal limits..  During hospitalization he was transitioned to Eliquis.  Symptoms resolved at the time of discharge.  He was discharged home on 02/17/2020.He has done well since being discharged home.  Occasionally has word finding difficulties but speech is otherwise normal.  No weakness or numbness.  He has had continued issues with low back pain.  Better today.  He also has a painful lesion on his right foot.  Has been there for several weeks.  ROS: Per HPI, otherwise a complete review of systems was negative.   PMH:  The following were reviewed and entered/updated in epic: Past Medical History:  Diagnosis Date  . BPH (benign prostatic hypertrophy)   . BPH associated with nocturia 08/29/2013  . BPH with obstruction/lower urinary tract symptoms 08/14/2019  . CAD (coronary artery disease)   . CAD (coronary artery disease), native coronary artery     PTCA of RCA 1986, PTCA of LAD 1992,  Negative treadmill Cardiolite 2011   . Chronic dermatitis of hands 10/12/2015  . Chronic venous insufficiency   . Coagulation disorder ([place]) 10/26/2018  . DVT (deep venous thrombosis) ([place])   . Dyslipidemia   . Eczema   . Erectile dysfunction 07/11/2012  . Essential hypertension   . GERD 08/04/2006      . GERD (gastroesophageal reflux disease)   . Hyperglycemia 07/08/2019  . Hyperlipidemia   . Hypertension   . Incomplete emptying of bladder 08/14/2019  . Long term current use of anticoagulant therapy   . Lumbar disc disease   . Overweight (BMI 25.0-29.9) 06/18/2015  . Persistent atrial fibrillation ([place]) 04/10/2018  . Personal history of DVT (deep vein thrombosis)    Initially in 1998 with recurrence in 2002 now on chronic warfarin   . Soft tissue lesion of foot 08/27/2015  . Urinary tract infection with hematuria 08/14/2019   Patient Active Problem List   Diagnosis Date Noted  . Stroke ([place]) 02/16/2020  . Hypertension   . Hyperlipidemia   . GERD (gastroesophageal reflux disease)   . Eczema   . DVT (deep venous thrombosis) ([place])   . CAD (coronary artery disease)   . BPH with obstruction/lower urinary tract symptoms 08/14/2019  . Incomplete emptying of bladder 08/14/2019  . Urinary tract infection with hematuria 08/14/2019  . Hyperglycemia 07/08/2019  . Coagulation disorder ([place]) 10/26/2018  . Persistent atrial fibrillation ([place]) 04/10/2018  . Chronic dermatitis of hands 10/12/2015  . Soft tissue lesion of foot 08/27/2015  .  Overweight (BMI 25.0-29.9) 06/18/2015  . BPH associated with nocturia 08/29/2013  . CAD (coronary artery disease), native coronary artery   . Long term current use of anticoagulant therapy   . Personal history of DVT (deep vein thrombosis)   . Chronic venous insufficiency   . Erectile dysfunction 07/11/2012  . Lumbar disc disease   . GERD 08/04/2006  . Dyslipidemia   . Essential hypertension    Past Surgical  History:  Procedure Laterality Date  . CARDIAC CATHETERIZATION    . CARPAL TUNNEL RELEASE  04/21/2011   Procedure: CARPAL TUNNEL RELEASE;  Surgeon: Tennis Must, MD;  Location: [facility];  Service: Orthopedics;  Laterality: Left;  . CARPAL TUNNEL RELEASE  05/23/2011   Procedure: CARPAL TUNNEL RELEASE;  Surgeon: Tennis Must, MD;  Location: [facility];  Service: Orthopedics;  Laterality: Right;  . COLON SURGERY  2010 and 2011 colostomy reversal  . CORONARY ANGIOPLASTY    . EYE SURGERY     repair of macular hole  . NASAL SEPTUM SURGERY    . precutaneous transluminal coronary angioplasty    . right hernia    . TONSILLECTOMY AND ADENOIDECTOMY      Family History  Problem Relation Age of Onset  . Heart disease Father   . Heart attack Father   . Atrial fibrillation Father   . Heart disease Mother   . Atrial fibrillation Mother   . Stroke Mother   . AAA (abdominal aortic aneurysm) Brother     Medications- reviewed and updated Current Outpatient Medications  Medication Sig Dispense Refill  . amLODipine (NORVASC) 5 MG tablet TAKE 1 TABLET BY MOUTH EVERY OTHER DAY 45 tablet 3  . apixaban (ELIQUIS) 5 MG TABS tablet Take 1 tablet (5 mg total) by mouth 2 (two) times daily. 60 tablet 11  . atenolol (TENORMIN) 100 MG tablet TAKE 1 TABLET BY MOUTH EVERY DAY 90 tablet 1  . atorvastatin (LIPITOR) 40 MG tablet Take 0.5 tablets (20 mg total) by mouth daily. 90 tablet 1  . Coenzyme Q10 (COQ10) 200 MG CAPS Take 1 capsule by mouth daily.     . Cyanocobalamin (VITAMIN B-12) 1000 MCG SUBL Place 1 tablet under the tongue daily.    . hydroxypropyl methylcellulose / hypromellose (ISOPTO TEARS / GONIOVISC) 2.5 % ophthalmic solution Place 1 drop into both eyes 4 (four) times daily as needed for dry eyes.    Marland Kitchen lisinopril (ZESTRIL) 40 MG tablet TAKE 1 TABLET BY MOUTH EVERY DAY 90 tablet 0  . nitroGLYCERIN (NITROSTAT) 0.4 MG SL tablet Dissolve 1 tablet under the tongue every 5  minutes as needed 90 tablet 1  . tamsulosin (FLOMAX) 0.4 MG CAPS capsule Take 0.4 mg by mouth in the morning and at bedtime.     No current facility-administered medications for this visit.    Allergies-reviewed and updated Allergies  Allergen Reactions  . Other Other (See Comments)    Some form of numbing medication used by dentist, almost died  . Simvastatin Other (See Comments)    Muscle aches     Social History   Socioeconomic History  . Marital status: Married    Spouse name: Not on file  . Number of children: 3  . Years of education: 49  . Highest education level: Not on file  Occupational History  . Occupation: Retired  Tobacco Use  . Smoking status: Former Smoker    Packs/day: 1.00    Years: 60.00    Pack years:  60.00    Types: Cigarettes    Quit date: 03/25/1998    Years since quitting: 21.9  . Smokeless tobacco: Never Used  Vaping Use  . Vaping Use: Never used  Substance and Sexual Activity  . Alcohol use: Yes    Alcohol/week: 7.0 standard drinks    Types: 7 Glasses of wine per week    Comment: 7 glasses of red wine per week   . Drug use: No  . Sexual activity: Not on file  Other Topics Concern  . Not on file  Social History Narrative   Fun: Working outside in yard, cutting wood    Denies abuse and feels safe at home.    Social Determinants of Health   Financial Resource Strain: Not on file  Food Insecurity: Not on file  Transportation Needs: Not on file  Physical Activity: Not on file  Stress: Not on file  Social Connections: Not on file        Objective:  Physical Exam: BP 128/80   Pulse 61   Temp 97.7 F (36.5 C) (Temporal)   Ht 6' (1.829 m)   Wt 216 lb (98 kg)   SpO2 97%   BMI 29.29 kg/m   Gen: No acute distress, resting comfortably CV: Irregular with no murmurs appreciated Pulm: Normal work of breathing, clear to auscultation bilaterally with no crackles, wheezes, or rhonchi Neuro: Grossly normal, moves all extremities Skin:  Plantar wart on right sole. Psych: Normal affect and thought content  Time Spent: 45 minutes of total time was spent on the date of the encounter performing the following actions: chart review prior to seeing the patient including recent hospitalization, obtaining history, performing a medically necessary exam, counseling on the treatment plan, placing orders, and documenting in our EHR.        [name]. [name], MD 02/24/2020 1:37 PM

## 2020-02-24 NOTE — Assessment & Plan Note (Signed)
He has been planning to have TURP but this was canceled due to his stroke last week.  He wishes to hold off on surgery for now.

## 2020-02-24 NOTE — Assessment & Plan Note (Signed)
Will place referral to sports medicine.  No red flags.

## 2020-02-25 ENCOUNTER — Encounter: Payer: Self-pay | Admitting: Podiatry

## 2020-02-25 ENCOUNTER — Other Ambulatory Visit: Payer: Self-pay

## 2020-02-25 ENCOUNTER — Ambulatory Visit (INDEPENDENT_AMBULATORY_CARE_PROVIDER_SITE_OTHER): Payer: Medicare HMO | Admitting: Podiatry

## 2020-02-25 DIAGNOSIS — B351 Tinea unguium: Secondary | ICD-10-CM | POA: Diagnosis not present

## 2020-02-25 DIAGNOSIS — Q828 Other specified congenital malformations of skin: Secondary | ICD-10-CM

## 2020-02-25 DIAGNOSIS — D689 Coagulation defect, unspecified: Secondary | ICD-10-CM

## 2020-02-25 DIAGNOSIS — M79676 Pain in unspecified toe(s): Secondary | ICD-10-CM

## 2020-02-25 DIAGNOSIS — I872 Venous insufficiency (chronic) (peripheral): Secondary | ICD-10-CM

## 2020-02-25 NOTE — Progress Notes (Signed)
This patient returns to my office for at risk foot care.  This patient requires this care by a professional since this patient will be at risk due to having venous insufficiency and coagulation defect.  Patient is taking coumadin.  This patient is unable to cut nails himself since the patient cannot reach his nails.These nails are painful walking and wearing shoes.  This patient presents for at risk foot care today.  General Appearance  Alert, conversant and in no acute stress.  Vascular  Dorsalis pedis and posterior tibial  pulses are palpable  bilaterally.  Capillary return is within normal limits  bilaterally. Temperature is within normal limits  bilaterally.  Neurologic  Senn-Weinstein monofilament wire test within normal limits  bilaterally. Muscle power within normal limits bilaterally.  Nails Thick disfigured discolored nails with subungual debris  from hallux to fifth toes bilaterally. No evidence of bacterial infection or drainage bilaterally. Incurvation lateral border right hallux with no infection.  Orthopedic  No limitations of motion  feet .  No crepitus or effusions noted.  No bony pathology or digital deformities noted.  Skin  normotropic skin  noted bilaterally.  No signs of infections or ulcers noted.   Porokeratosis sub 5th met right foot.  Onychomycosis  Pain in right toes  Pain in left toes  Consent was obtained for treatment procedures.   Mechanical debridement of nails 1-5  bilaterally performed with a nail nipper.  Filed with dremel without incident.    Return office visit   10 weeks                  Told patient to return for periodic foot care and evaluation due to potential at risk complications.   Gardiner Barefoot DPM

## 2020-02-25 NOTE — Progress Notes (Signed)
   Subjective:   I, William Norman, LAT, ATC acting as a scribe for Lynne Leader, MD.  I'm seeing this patient as a consultation for Dr. Dimas Chyle. Note will be routed back to referring provider/PCP.  CC: R buttock pain  HPI: Pt is a 80yo male c/o low back pain ongoing for 2-3 months w/ no known MOI. Pt locates pain to R low back and buttock area (superior). No radiating pain weakness or numbness distally. Patient feels well otherwise. Pain is mostly worse with activity and better with rest. He has not tried much treatment yet aside from some time.  Radiating pn: no LE numbness/tingling: yes LE weakness: yes Aggravates: walking Rx tried: rest  Dx imaging: 02/15/07 L-spine XR  Past medical history, Surgical history, Family history, Social history, Allergies, and medications have been entered into the medical record, reviewed.   Review of Systems: No new headache, visual changes, nausea, vomiting, diarrhea, constipation, dizziness, abdominal pain, skin rash, fevers, chills, night sweats, weight loss, swollen lymph nodes, body aches, joint swelling, muscle aches, chest pain, shortness of breath, mood changes, visual or auditory hallucinations.   Objective:    Vitals:   02/26/20 1554  BP: 118/76   General: Well Developed, well nourished, and in no acute distress.  Neuro/Psych: Alert and oriented x3, extra-ocular muscles intact, able to move all 4 extremities, sensation grossly intact. Skin: Warm and dry, no rashes noted.  Respiratory: Not using accessory muscles, speaking in full sentences, trachea midline.  Cardiovascular: Pulses palpable, no extremity edema. Abdomen: Does not appear distended. MSK: L-spine normal-appearing Nontender midline or paraspinal musculature. Limited motion especially to flexion. Lower extremity strength reflexes and sensation are intact distally.  Lab and Radiology Results  X-ray images lumbar spine obtained today personally and independently  interpreted Significant multilevel degenerative disc disease and facet DJD. No acute fractures visible. Await formal radiology review  Impression and Recommendations:    Assessment and Plan: 80 y.o. male with right low back pain ongoing for several months. Patient does have significant degeneration on lumbar spine seen on x-ray however I believe the main cause of his pain is most likely muscle dysfunction and spasm. He is a good candidate for physical therapy. Plan for referral to PT. Discussed medications I am not optimistic that medicines would be more helpful than harmful including muscle relaxers. Recommend Tylenol which is reasonable. Also recommend heating pad and TENS unit. Recheck back in 6 to 8 weeks.Marland Kitchen  PDMP not reviewed this encounter. Orders Placed This Encounter  Procedures  . DG Lumbar Spine 2-3 Views    Standing Status:   Future    Number of Occurrences:   1    Standing Expiration Date:   02/25/2021    Order Specific Question:   Reason for Exam (SYMPTOM  OR DIAGNOSIS REQUIRED)    Answer:   low back pain    Order Specific Question:   Preferred imaging location?    Answer:   [facility]  . Ambulatory referral to Physical Therapy    Referral Priority:   Routine    Referral Type:   Physical Medicine    Referral Reason:   Specialty Services Required    Requested Specialty:   Physical Therapy   No orders of the defined types were placed in this encounter.   Discussed warning signs or symptoms. Please see discharge instructions. Patient expresses understanding.   The above documentation has been reviewed and is accurate and complete Lynne Leader, M.D.

## 2020-02-26 ENCOUNTER — Ambulatory Visit: Payer: Medicare HMO | Admitting: Family Medicine

## 2020-02-26 ENCOUNTER — Encounter: Payer: Self-pay | Admitting: Family Medicine

## 2020-02-26 ENCOUNTER — Other Ambulatory Visit: Payer: Self-pay

## 2020-02-26 ENCOUNTER — Ambulatory Visit: Payer: Medicare HMO

## 2020-02-26 ENCOUNTER — Ambulatory Visit (INDEPENDENT_AMBULATORY_CARE_PROVIDER_SITE_OTHER): Payer: Medicare HMO

## 2020-02-26 VITALS — BP 118/76 | Ht 72.0 in | Wt 216.4 lb

## 2020-02-26 DIAGNOSIS — M545 Low back pain, unspecified: Secondary | ICD-10-CM

## 2020-02-26 IMAGING — DX DG LUMBAR SPINE 2-3V
3 series · 3 of 3 positions shown · non-contrast
Comparison: [DATE]

CLINICAL DATA: Low back pain

EXAM:
LUMBAR SPINE - 2-3 VIEW

[l-spine ap]
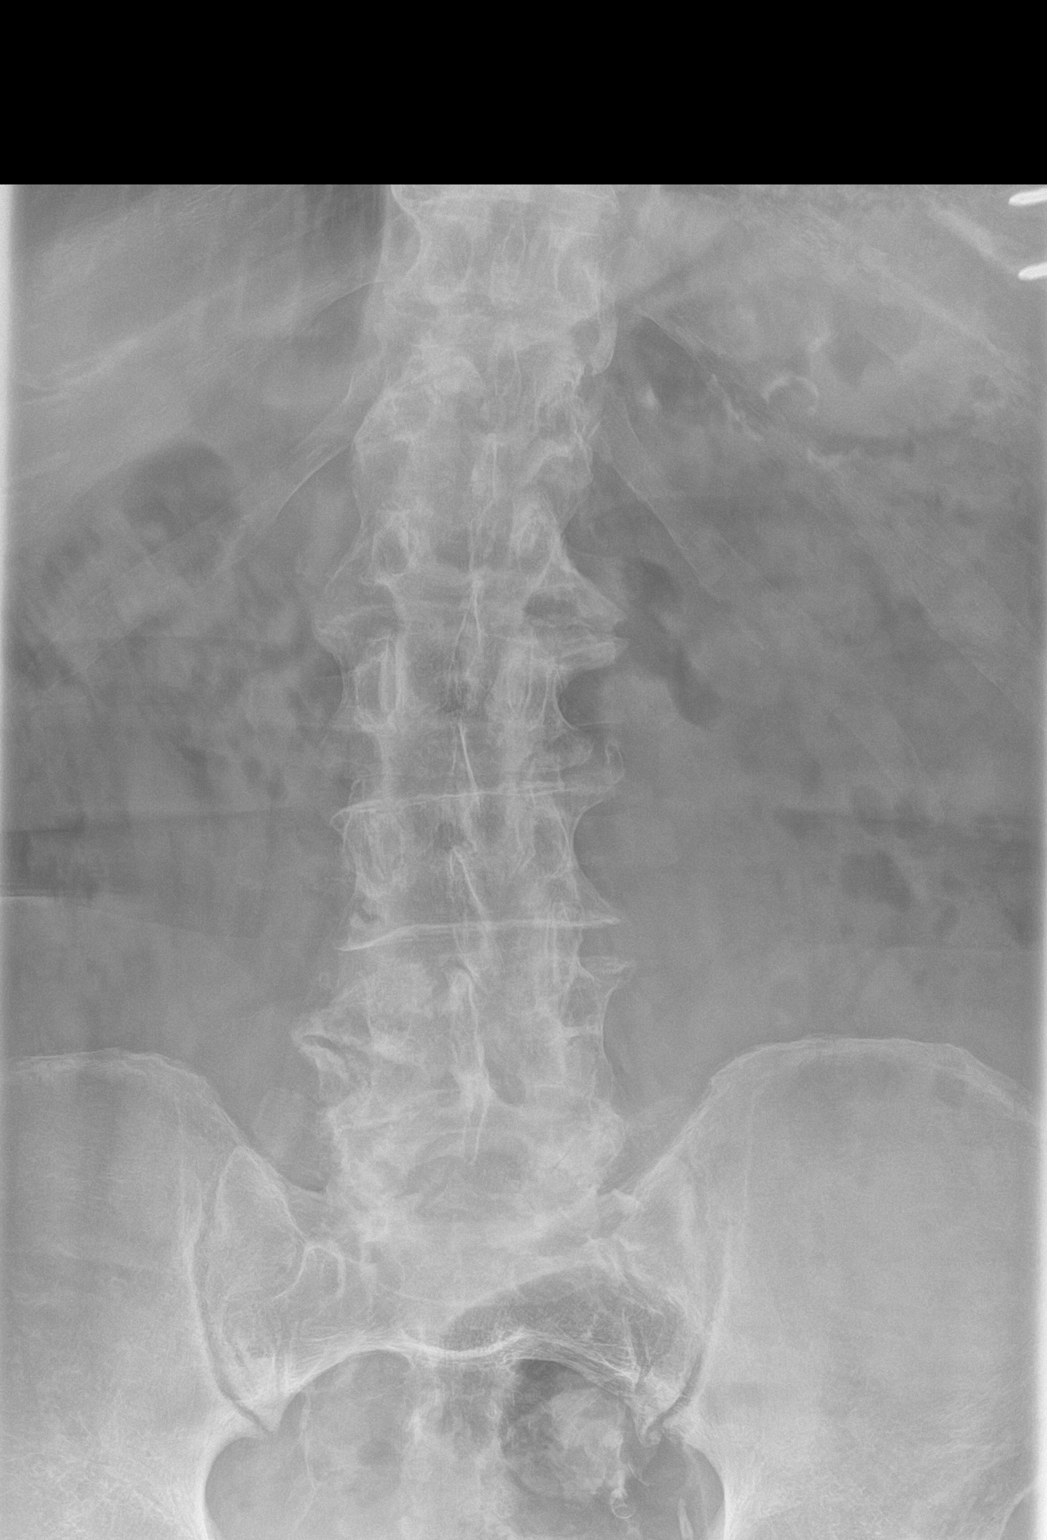

[l-spine lateral (1 of 2)]
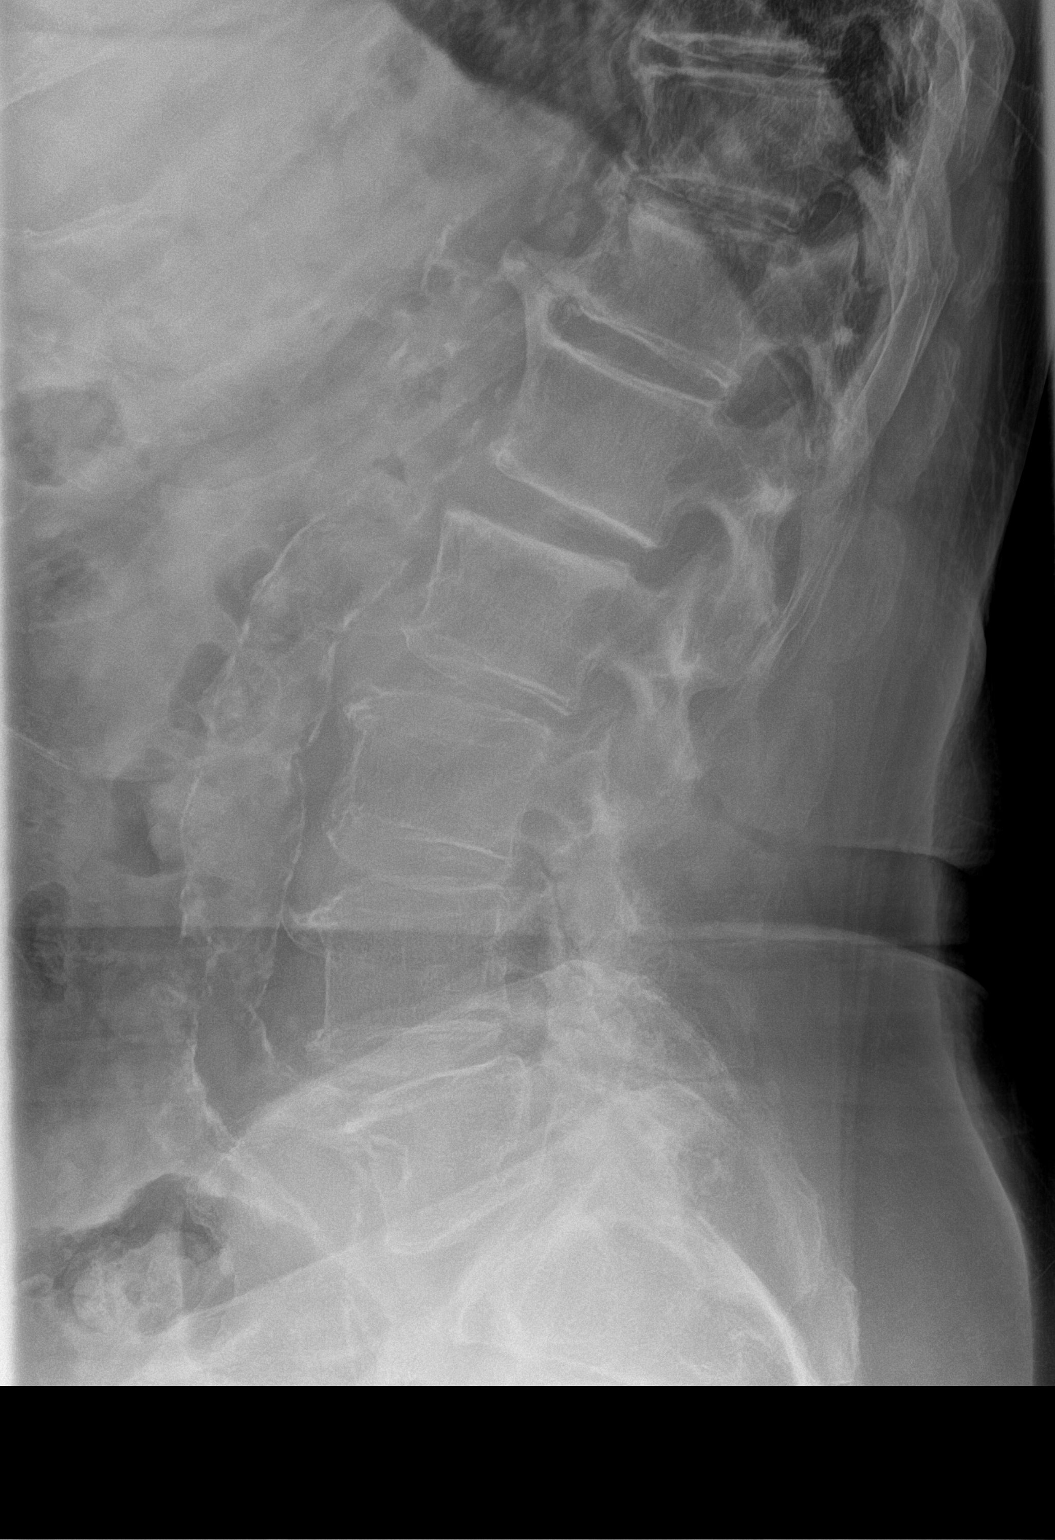

[l-spine lateral (2 of 2)]
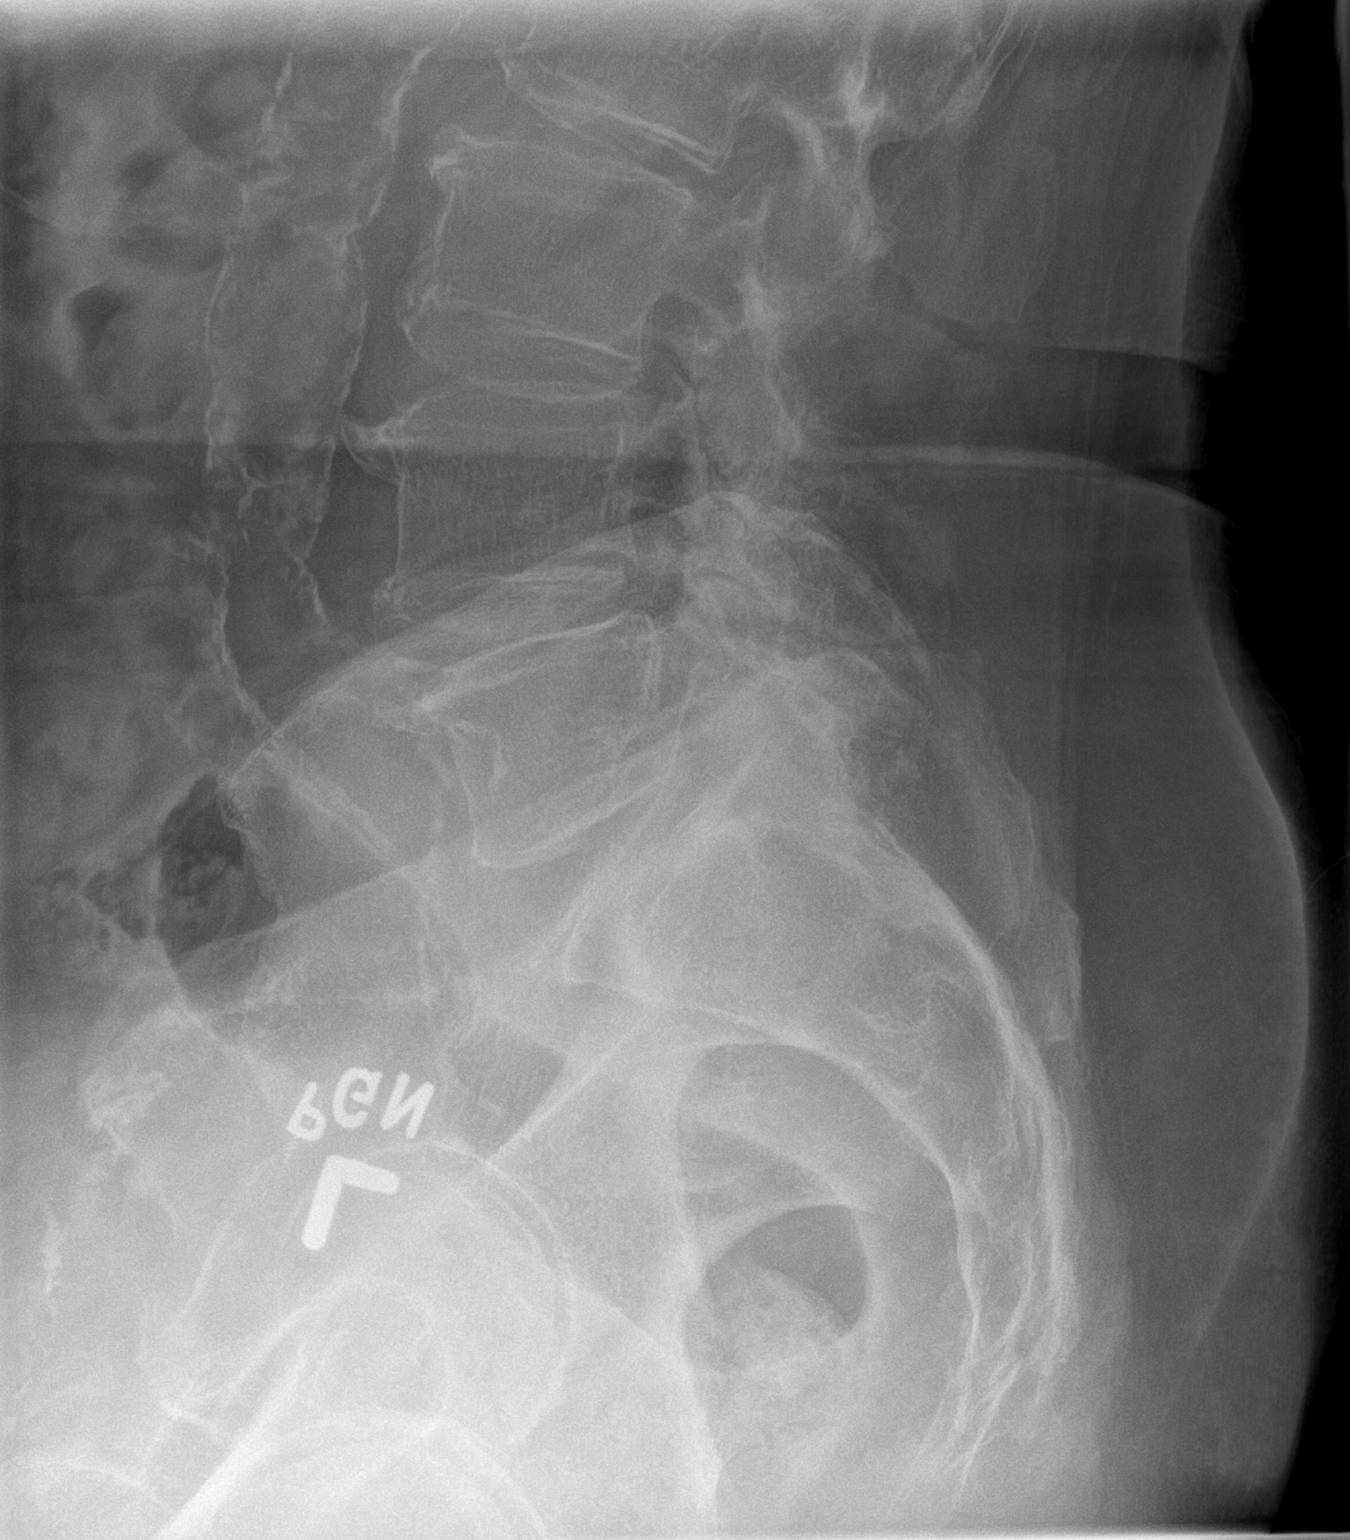

[3 of 3 positions shown; findings below may reference images not displayed]

FINDINGS: Multilevel degenerative changes are noted throughout the lumbar
spine that are mild-to-moderate in severity. These are greatest in
the lower lumbar segments. There is no acute compression fracture.
Bridging anterior osteophytes are noted compatible with DISH. There
is facet arthrosis in the lower lumbar segments. There is no
significant malalignment. There are advanced vascular calcifications
of the abdominal aorta. The infrarenal abdominal aorta appears to be
aneurysmal measuring approximately 3.3 cm in diameter.
IMPRESSION: 1. No acute osseous abnormality.
2. Degenerative changes of the lumbar spine as detailed above.
3. Advanced vascular calcifications of the abdominal aorta with a
possible abdominal aortic aneurysm. Follow-up with outpatient
ultrasound is recommended.

## 2020-02-26 NOTE — Patient Instructions (Addendum)
Thank you for coming in today.  Please get an Xray today before you leave  I've referred you to Physical Therapy.  Let us know if you don't hear from them in one week.  Try using a heating pad.   Tylenol arthritis will help some too.   A TENS unit may also help.   Recheck in about 6-8 weeks.   Let me know sooner if this is not working or you are having a problem.    TENS UNIT: This is helpful for muscle pain and spasm.   Search and Purchase a TENS 7000 2nd edition at  www.tenspros.com or www.[place].com It should be less than $30.     TENS unit instructions: Do not shower or bathe with the unit on . Turn the unit off before removing electrodes or batteries . If the electrodes lose stickiness add a drop of water to the electrodes after they are disconnected from the unit and place on plastic sheet. If you continued to have difficulty, call the TENS unit company to purchase more electrodes. . Do not apply lotion on the skin area prior to use. Make sure the skin is clean and dry as this will help prolong the life of the electrodes. . After use, always check skin for unusual red areas, rash or other skin difficulties. If there are any skin problems, does not apply electrodes to the same area. . Never remove the electrodes from the unit by pulling the wires. . Do not use the TENS unit or electrodes other than as directed. . Do not change electrode placement without consultating your therapist or physician. Marland Kitchen Keep 2 fingers with between each electrode. . Wear time ratio is 2:1, on to off times.    For example on for 30 minutes off for 15 minutes and then on for 30 minutes off for 15 minutes

## 2020-02-27 ENCOUNTER — Telehealth: Payer: Self-pay | Admitting: Family Medicine

## 2020-02-27 DIAGNOSIS — I714 Abdominal aortic aneurysm, without rupture, unspecified: Secondary | ICD-10-CM

## 2020-02-27 NOTE — Telephone Encounter (Signed)
Called pt and relayed information regarding order for vascular doppler US study to investigate abdominal aorta.  Pt verbalizes understanding.

## 2020-02-27 NOTE — Progress Notes (Signed)
Significant arthritis is present.  Radiology thinks that they can see an abdominal aortic aneurysm and would like me to get an ultrasound to follow this up.  I will order this test and further testing will be done with Dr. [name] or a vascular surgeon.  I do not think the aneurysm has anything to do with your back pain today but it should be worked up further.  I have ordered the ultrasound.  You should be contacted soon about scheduling.

## 2020-02-27 NOTE — Telephone Encounter (Signed)
Ordered the Vas [place] study to the wrong location. Will change to the [facility] Location here in [place]

## 2020-02-27 NOTE — Telephone Encounter (Signed)
X-ray of lumbar spine showed concern for abdominal aortic aneurysm.  Radiology requested ultrasound to further evaluate it.  Vascular ultrasound ordered for the [facility] location.  Please contact the vascular ultrasound location to schedule the ultrasound and notify patient.

## 2020-03-05 ENCOUNTER — Other Ambulatory Visit: Payer: Self-pay

## 2020-03-05 ENCOUNTER — Ambulatory Visit (HOSPITAL_COMMUNITY)
Admission: RE | Admit: 2020-03-05 | Discharge: 2020-03-05 | Disposition: A | Discharge status: Home / Self Care | Payer: Medicare HMO | Source: Ambulatory Visit | Attending: Family Medicine | Admitting: Family Medicine

## 2020-03-05 DIAGNOSIS — I714 Abdominal aortic aneurysm, without rupture, unspecified: Secondary | ICD-10-CM

## 2020-03-05 NOTE — Progress Notes (Signed)
Ultrasound of the abdominal aorta measures and aneurysm of 2.5 cm.  This is well below the size that we get worried about surgery.  But this is usually something that we want to follow along every once in a while.  I will make sure Dr. [name] knows about it and he can take over care from here.

## 2020-03-06 ENCOUNTER — Encounter: Payer: Self-pay | Admitting: Family Medicine

## 2020-03-06 DIAGNOSIS — I714 Abdominal aortic aneurysm, without rupture, unspecified: Secondary | ICD-10-CM | POA: Insufficient documentation

## 2020-03-10 ENCOUNTER — Ambulatory Visit: Payer: Medicare HMO | Admitting: Physical Therapy

## 2020-03-10 ENCOUNTER — Other Ambulatory Visit: Payer: Self-pay

## 2020-03-10 ENCOUNTER — Encounter: Payer: Self-pay | Admitting: Physical Therapy

## 2020-03-10 DIAGNOSIS — M545 Low back pain, unspecified: Secondary | ICD-10-CM

## 2020-03-10 NOTE — Patient Instructions (Signed)
Access Code: [email or web address] URL: [email or web address] Date: 03/10/2020 Prepared by: Lyndee Hensen  Exercises Supine Single Knee to Chest Stretch - 2 x daily - 3 reps - 30 hold Supine Piriformis Stretch Pulling Heel to Hip - 2 x daily - 3 reps - 30 hold Supine Lower Trunk Rotation - 2 x daily - 10 reps - 5 hold

## 2020-03-19 ENCOUNTER — Ambulatory Visit: Payer: Medicare HMO | Admitting: Physical Therapy

## 2020-03-19 ENCOUNTER — Other Ambulatory Visit: Payer: Self-pay

## 2020-03-19 DIAGNOSIS — M545 Low back pain, unspecified: Secondary | ICD-10-CM

## 2020-03-22 ENCOUNTER — Encounter: Payer: Self-pay | Admitting: Physical Therapy

## 2020-03-22 NOTE — Therapy (Signed)
[facility] [address] [place], [place], [address] Phone: [phone]   Fax:  [phone]  Physical Therapy Evaluation  Patient Details  Name: William Norman MRN: [id number] Date of Birth: December 06, 1940 Referring Provider (PT): Lynne Leader   Encounter Date: 03/10/2020   PT End of Session - 03/22/20 1445    Visit Number 1    Number of Visits 12    Date for PT Re-Evaluation 04/21/20    Authorization Type [employer] Medicare    PT Start Time [id number]    PT Stop Time 1430    PT Time Calculation (min) 42 min    Activity Tolerance Patient tolerated treatment well    Behavior During Therapy [facility] for tasks assessed/performed           Past Medical History:  Diagnosis Date  . BPH (benign prostatic hypertrophy)   . BPH associated with nocturia 08/29/2013  . BPH with obstruction/lower urinary tract symptoms 08/14/2019  . CAD (coronary artery disease)   . CAD (coronary artery disease), native coronary artery    PTCA of RCA 1986, PTCA of LAD 1992,  Negative treadmill Cardiolite 2011   . Chronic dermatitis of hands 10/12/2015  . Chronic venous insufficiency   . Coagulation disorder ([place]) 10/26/2018  . DVT (deep venous thrombosis) ([place])   . Dyslipidemia   . Eczema   . Erectile dysfunction 07/11/2012  . Essential hypertension   . GERD 08/04/2006      . GERD (gastroesophageal reflux disease)   . Hyperglycemia 07/08/2019  . Hyperlipidemia   . Hypertension   . Incomplete emptying of bladder 08/14/2019  . Long term current use of anticoagulant therapy   . Lumbar disc disease   . Overweight (BMI 25.0-29.9) 06/18/2015  . Persistent atrial fibrillation ([place]) 04/10/2018  . Personal history of DVT (deep vein thrombosis)    Initially in 1998 with recurrence in 2002 now on chronic warfarin   . Soft tissue lesion of foot 08/27/2015  . Urinary tract infection with hematuria 08/14/2019    Past Surgical History:  Procedure Laterality Date  . CARDIAC  CATHETERIZATION    . CARPAL TUNNEL RELEASE  04/21/2011   Procedure: CARPAL TUNNEL RELEASE;  Surgeon: Tennis Must, MD;  Location: [facility];  Service: Orthopedics;  Laterality: Left;  . CARPAL TUNNEL RELEASE  05/23/2011   Procedure: CARPAL TUNNEL RELEASE;  Surgeon: Tennis Must, MD;  Location: [facility];  Service: Orthopedics;  Laterality: Right;  . COLON SURGERY  2010 and 2011 colostomy reversal  . CORONARY ANGIOPLASTY    . EYE SURGERY     repair of macular hole  . NASAL SEPTUM SURGERY    . precutaneous transluminal coronary angioplasty    . right hernia    . TONSILLECTOMY AND ADENOIDECTOMY      There were no vitals filed for this visit.    Subjective Assessment - 03/22/20 1443    Subjective Notes increased back pain on R for about 6 months, no injury to report. Increased pain after increased standing walking, bending.    Pertinent History Did have recent CVA, States continued mild deficits with word finding, speech. also states increased shakiness in hands,    Patient Stated Goals Decreased pain    Currently in Pain? Yes    Pain Score 6     Pain Location Back    Pain Orientation Right    Pain Descriptors / Indicators Aching    Pain Type Acute pain  Pain Onset More than a month ago    Pain Frequency Intermittent    Aggravating Factors  increased bending, lifting, outdoor work.              [facility] PT Assessment - 03/22/20 0001      Assessment   Medical Diagnosis Low Back pain    Referring Provider (PT) Lynne Leader    Hand Dominance Right    Prior Therapy no      Balance Screen   Has the patient fallen in the past 6 months No      Prior Function   Level of Independence Independent      Cognition   Overall Cognitive Status Within Functional Limits for tasks assessed      AROM   Overall AROM Comments Lumbar: mild limitation for flexion/SB, mod limitation for ext      Strength   Overall Strength Comments Hips: flex 4/5, abd 4-/5;   Core: 3/5      Palpation   Palpation comment Soreness in R low lumbar, SI into R glute      Special Tests   Other special tests Neg SLR                      Objective measurements completed on examination: See above findings.       [facility] Adult PT Treatment/Exercise - 03/22/20 0001      Exercises   Exercises Lumbar      Lumbar Exercises: Stretches   Single Knee to Chest Stretch 2 reps;30 seconds    Single Knee to Chest Stretch Limitations bil    Lower Trunk Rotation 5 reps;10 seconds    Piriformis Stretch 3 reps;30 seconds    Piriformis Stretch Limitations supine mod fig 4;                  PT Education - 03/22/20 1445    Education Details PT POC, Exam findings, HEP    Person(s) Educated Patient    Methods Explanation;Demonstration;Verbal cues;Handout    Comprehension Verbalized understanding;Returned demonstration;Verbal cues required;Need further instruction            PT Short Term Goals - 03/22/20 1455      PT SHORT TERM GOAL #1   Title Pt to be independent with initial HEP    Time 2    Period Weeks    Status New    Target Date 03/24/20             PT Long Term Goals - 03/22/20 1456      PT LONG TERM GOAL #1   Title Pt to be independent with final HEP    Time 6    Period Weeks    Status New    Target Date 04/21/20      PT LONG TERM GOAL #2   Title Pt to demo lumbar ROM to be [facility] for age, without pain. to improve ability for IADLS.    Time 6    Period Weeks    Status New    Target Date 04/21/20      PT LONG TERM GOAL #3   Title Pt to demo optimal posture for bend, lift, squat, to improve safety and pain with outdoor activities and IADLS.    Time 6    Period Weeks    Status New    Target Date 04/21/20      PT LONG TERM GOAL #4   Title Pt to report decreased  pain in R side of low bck to 0-2/10 with activity    Time 6    Period Weeks    Status New    Target Date 04/21/20                  Plan - 03/22/20  1458    Clinical Impression Statement Pt presents wtih primary complaint of increased pain in R side of low back. He has increased pain with bending, lifting and IADLS. Pt with variable pain depending on activity. He has mild ROM deficits and strength deficits, and has lack of effective HEP for diagnosis. Pt to benefit from skilled PTto improve deficits and pain.    Examination-Activity Limitations Locomotion Level;Bend;Squat;Stand;Lift    Examination-Participation Restrictions Community Activity;Yard Work;Cleaning    Stability/Clinical Decision Making Stable/Uncomplicated    Clinical Decision Making Low    Rehab Potential Good    PT Frequency 2x / week    PT Duration 6 weeks    PT Treatment/Interventions ADLs/Self Care Home Management;Cryotherapy;Electrical Stimulation;Ultrasound;Traction;Moist Heat;Iontophoresis 4mg /ml Dexamethasone;Gait training;Stair training;Functional mobility training;Therapeutic activities;Therapeutic exercise;Balance training;Patient/family education;Neuromuscular re-education;Manual techniques;Passive range of motion;Taping;Dry needling;Spinal Manipulations;Joint Manipulations    PT Home Exercise Plan [email or web address]    Consulted and Agree with Plan of Care Patient           Patient will benefit from skilled therapeutic intervention in order to improve the following deficits and impairments:  Decreased range of motion,Increased muscle spasms,Decreased activity tolerance,Pain,Hypomobility,Impaired flexibility,Improper body mechanics,Decreased mobility,Decreased strength  Visit Diagnosis: Acute right-sided low back pain without sciatica     Problem List Patient Active Problem List   Diagnosis Date Noted  . AAA (abdominal aortic aneurysm) ([place]) 03/06/2020  . Porokeratosis 02/25/2020  . Stroke ([place]) 02/16/2020  . Hypertension   . Hyperlipidemia   . GERD (gastroesophageal reflux disease)   . Eczema   . DVT (deep venous thrombosis) ([place])   . CAD (coronary artery  disease)   . BPH with obstruction/lower urinary tract symptoms 08/14/2019  . Incomplete emptying of bladder 08/14/2019  . Urinary tract infection with hematuria 08/14/2019  . Hyperglycemia 07/08/2019  . Coagulation disorder ([place]) 10/26/2018  . Persistent atrial fibrillation ([place]) 04/10/2018  . Chronic dermatitis of hands 10/12/2015  . Soft tissue lesion of foot 08/27/2015  . Overweight (BMI 25.0-29.9) 06/18/2015  . BPH associated with nocturia 08/29/2013  . CAD (coronary artery disease), native coronary artery   . Long term current use of anticoagulant therapy   . Personal history of DVT (deep vein thrombosis)   . Chronic venous insufficiency   . Erectile dysfunction 07/11/2012  . Lumbar disc disease   . GERD 08/04/2006  . Dyslipidemia   . Essential hypertension    Lyndee Hensen, PT, DPT 3:03 PM  03/22/20     [place], [place], [address] Phone: [phone]   Fax:  [phone]  Name: William Norman MRN: [id number] Date of Birth: [birth year]

## 2020-03-22 NOTE — Therapy (Signed)
[facility] [address] [place], [place], [address] Phone: [phone]   Fax:  [phone]  Physical Therapy Treatment  Patient Details  Name: William Norman MRN: [id number] Date of Birth: 02-16-1940 Referring Provider (PT): Lynne Leader   Encounter Date: 03/19/2020   PT End of Session - 03/22/20 1507    Visit Number 2    Number of Visits 12    Date for PT Re-Evaluation 04/21/20    Authorization Type Aetna Medicare    PT Start Time 1346    PT Stop Time 1428    PT Time Calculation (min) 42 min    Activity Tolerance Patient tolerated treatment well    Behavior During Therapy [facility] for tasks assessed/performed           Past Medical History:  Diagnosis Date  . BPH (benign prostatic hypertrophy)   . BPH associated with nocturia 08/29/2013  . BPH with obstruction/lower urinary tract symptoms 08/14/2019  . CAD (coronary artery disease)   . CAD (coronary artery disease), native coronary artery    PTCA of RCA 1986, PTCA of LAD 1992,  Negative treadmill Cardiolite 2011   . Chronic dermatitis of hands 10/12/2015  . Chronic venous insufficiency   . Coagulation disorder ([place]) 10/26/2018  . DVT (deep venous thrombosis) ([place])   . Dyslipidemia   . Eczema   . Erectile dysfunction 07/11/2012  . Essential hypertension   . GERD 08/04/2006      . GERD (gastroesophageal reflux disease)   . Hyperglycemia 07/08/2019  . Hyperlipidemia   . Hypertension   . Incomplete emptying of bladder 08/14/2019  . Long term current use of anticoagulant therapy   . Lumbar disc disease   . Overweight (BMI 25.0-29.9) 06/18/2015  . Persistent atrial fibrillation ([place]) 04/10/2018  . Personal history of DVT (deep vein thrombosis)    Initially in 1998 with recurrence in 2002 now on chronic warfarin   . Soft tissue lesion of foot 08/27/2015  . Urinary tract infection with hematuria 08/14/2019    Past Surgical History:  Procedure Laterality Date  . CARDIAC  CATHETERIZATION    . CARPAL TUNNEL RELEASE  04/21/2011   Procedure: CARPAL TUNNEL RELEASE;  Surgeon: Tennis Must, MD;  Location: [facility];  Service: Orthopedics;  Laterality: Left;  . CARPAL TUNNEL RELEASE  05/23/2011   Procedure: CARPAL TUNNEL RELEASE;  Surgeon: Tennis Must, MD;  Location: [facility];  Service: Orthopedics;  Laterality: Right;  . COLON SURGERY  2010 and 2011 colostomy reversal  . CORONARY ANGIOPLASTY    . EYE SURGERY     repair of macular hole  . NASAL SEPTUM SURGERY    . precutaneous transluminal coronary angioplasty    . right hernia    . TONSILLECTOMY AND ADENOIDECTOMY      There were no vitals filed for this visit.   Subjective Assessment - 03/22/20 1506    Subjective Pt states minimal pain today, but had quite a bit of pain yesterday after working in the yard. Admits to not trying HEP yet    Patient Stated Goals Decreased pain    Currently in Pain? Yes    Pain Score 2     Pain Location Back    Pain Orientation Right    Pain Descriptors / Indicators Aching    Pain Type Acute pain    Pain Onset More than a month ago    Pain Frequency Intermittent  [facility] Adult PT Treatment/Exercise - 03/22/20 1509      Exercises   Exercises Lumbar      Lumbar Exercises: Stretches   Active Hamstring Stretch 3 reps;30 seconds    Active Hamstring Stretch Limitations seated    Single Knee to Chest Stretch 2 reps;30 seconds    Single Knee to Chest Stretch Limitations bil    Lower Trunk Rotation 5 reps;10 seconds    Piriformis Stretch 3 reps;30 seconds    Piriformis Stretch Limitations supine mod fig 4;      Lumbar Exercises: Aerobic   Recumbent Bike L1 x 6 min;      Lumbar Exercises: Supine   Pelvic Tilt 15 reps    Bent Knee Raise 20 reps    Straight Leg Raise 20 reps    Other Supine Lumbar Exercises Clam GTB x 20      Manual Therapy   Manual Therapy Soft tissue mobilization;Manual  Traction    Soft tissue mobilization STM to R lumar region    Manual Traction long leg distraction R for lumbar pump x 2 min                  PT Education - 03/22/20 1507    Education Details HEP review,    Person(s) Educated Patient    Methods Explanation;Demonstration;Tactile cues;Verbal cues;Handout    Comprehension Verbalized understanding;Returned demonstration;Verbal cues required;Tactile cues required;Need further instruction            PT Short Term Goals - 03/22/20 1455      PT SHORT TERM GOAL #1   Title Pt to be independent with initial HEP    Time 2    Period Weeks    Status New    Target Date 03/24/20             PT Long Term Goals - 03/22/20 1456      PT LONG TERM GOAL #1   Title Pt to be independent with final HEP    Time 6    Period Weeks    Status New    Target Date 04/21/20      PT LONG TERM GOAL #2   Title Pt to demo lumbar ROM to be [facility] for age, without pain. to improve ability for IADLS.    Time 6    Period Weeks    Status New    Target Date 04/21/20      PT LONG TERM GOAL #3   Title Pt to demo optimal posture for bend, lift, squat, to improve safety and pain with outdoor activities and IADLS.    Time 6    Period Weeks    Status New    Target Date 04/21/20      PT LONG TERM GOAL #4   Title Pt to report decreased pain in R side of low bck to 0-2/10 with activity    Time 6    Period Weeks    Status New    Target Date 04/21/20                 Plan - 03/22/20 1508    Clinical Impression Statement Practiced proper mechanics for bend, squat , lift today, reinforced importance of this and HEP at home. Reviewed mechanics for HEP. Plan to progress strength as tolerated.    Examination-Activity Limitations Locomotion Level;Bend;Squat;Stand;Lift    Examination-Participation Restrictions Community Activity;Yard Work;Cleaning    Stability/Clinical Decision Making Stable/Uncomplicated    Rehab Potential Good    PT  Frequency 2x /  week    PT Duration 6 weeks    PT Treatment/Interventions ADLs/Self Care Home Management;Cryotherapy;Electrical Stimulation;Ultrasound;Traction;Moist Heat;Iontophoresis 4mg /ml Dexamethasone;Gait training;Stair training;Functional mobility training;Therapeutic activities;Therapeutic exercise;Balance training;Patient/family education;Neuromuscular re-education;Manual techniques;Passive range of motion;Taping;Dry needling;Spinal Manipulations;Joint Manipulations    PT Home Exercise Plan [email or web address]    Consulted and Agree with Plan of Care Patient           Patient will benefit from skilled therapeutic intervention in order to improve the following deficits and impairments:  Decreased range of motion,Increased muscle spasms,Decreased activity tolerance,Pain,Hypomobility,Impaired flexibility,Improper body mechanics,Decreased mobility,Decreased strength  Visit Diagnosis: Acute right-sided low back pain without sciatica     Problem List Patient Active Problem List   Diagnosis Date Noted  . AAA (abdominal aortic aneurysm) ([place]) 03/06/2020  . Porokeratosis 02/25/2020  . Stroke ([place]) 02/16/2020  . Hypertension   . Hyperlipidemia   . GERD (gastroesophageal reflux disease)   . Eczema   . DVT (deep venous thrombosis) ([place])   . CAD (coronary artery disease)   . BPH with obstruction/lower urinary tract symptoms 08/14/2019  . Incomplete emptying of bladder 08/14/2019  . Urinary tract infection with hematuria 08/14/2019  . Hyperglycemia 07/08/2019  . Coagulation disorder ([place]) 10/26/2018  . Persistent atrial fibrillation ([place]) 04/10/2018  . Chronic dermatitis of hands 10/12/2015  . Soft tissue lesion of foot 08/27/2015  . Overweight (BMI 25.0-29.9) 06/18/2015  . BPH associated with nocturia 08/29/2013  . CAD (coronary artery disease), native coronary artery   . Long term current use of anticoagulant therapy   . Personal history of DVT (deep vein thrombosis)   . Chronic venous insufficiency    . Erectile dysfunction 07/11/2012  . Lumbar disc disease   . GERD 08/04/2006  . Dyslipidemia   . Essential hypertension     Lyndee Hensen, PT, DPT 3:12 PM  03/22/20    [place], [place], [address] Phone: [phone]   Fax:  [phone]  Name: Drew Lips MRN: [id number] Date of Birth: 07-06-1940

## 2020-03-23 ENCOUNTER — Encounter: Payer: Self-pay | Admitting: Adult Health

## 2020-03-23 ENCOUNTER — Ambulatory Visit: Payer: Medicare HMO | Admitting: Adult Health

## 2020-03-23 VITALS — BP 103/70 | HR 84 | Ht 72.0 in | Wt 215.0 lb

## 2020-03-23 DIAGNOSIS — G250 Essential tremor: Secondary | ICD-10-CM | POA: Diagnosis not present

## 2020-03-23 DIAGNOSIS — I63412 Cerebral infarction due to embolism of left middle cerebral artery: Secondary | ICD-10-CM | POA: Diagnosis not present

## 2020-03-23 NOTE — Progress Notes (Signed)
[facility] [address] [place]. [place] [address] [phone]       HOSPITAL FOLLOW UP NOTE  William Norman Date of Birth:  [birth year] Medical Record Number:  [id number]   Reason for Referral:  hospital stroke follow up    SUBJECTIVE:   CHIEF COMPLAINT:  Chief Complaint  Patient presents with  . Hospitalization Follow-up    Patient here for stroke follow-up; He thinks he has some lasting symptoms such as short term memory loss. Denies any weakness, numbness, or tingling. He notices he gets nervous sometimes and can feel his face quivering. He has to sit back and relax for it to ease off.  . Room 15    Here alone     HPI:   Mr. William Norman is a 80 y.o. male with PMHx of HTN, Afib on Warfarin, h/o DVT, BPH, HLD, CAD presented to [facility] ED on 02/16/2020 after an episode of transient aphasia at home.  Personally reviewed hospitalization pertinent progress notes, lab work and imaging with summary provided.  Evaluated by Dr. Leonie Man stroke work-up revealing left posterior insula and parietal operculum infarcts likely secondary to atrial fibrillation with warfarin held for upcoming cystoscopy.  CTA head/neck unremarkable.  Recommended changing warfarin to Eliquis for secondary stroke prevention.  History of HTN stable on amlodipine, atenolol and lisinopril.  History of HLD on atorvastatin 40 mg daily with LDL 86.  No history or evidence of DM with A1c 5.5.  Other stroke risk factors include advanced age and CAD but no prior stroke history.  Evaluated by therapies and discharged home in stable condition without therapy needs.  Stroke: Left posterior insula and parietal operculum, embolic secondary to known A. fib off warfarin for upcoming cystoscopy  CT head: No acute intracranial abnormalities. Chronic atrophy and small vessel ischemia.  CTA head - No emergent large vessel occlusion or hemodynamically significant stenosis of the head   CTA neck - No  emergent large vessel occlusion or hemodynamically significant  stenosis of the neck.  CT perfusion - not ordered  MRI - Acute infarct left posterior insula and parietal operculum without hemorrhage. Moderate chronic microvascular ischemic change in the white matter.  Echo complete Bubble study: Agitated saline contrast bubble study was negative, with no evidence of any interatrial shunt.  LDL 86  HgbA1c 5.5  warfarin daily prior to admission, was transitioned to Eliquis (apixaban) daily this admission.  Therapy recommendations:  No PT follow up, Supervision-Intermittent   Disposition:  Likely home   Today, 03/23/2020, [name] is being seen for hospital follow-up unaccompanied.  He has been overall stable since discharge and reports occasional word finding difficulties and short-term memory impairment.  He continues to live independently maintaining ADLs and IADLs without difficulty.  Denies new stroke/TIA symptoms.  He does report a longstanding history of BUE and facial tremors which worsen with increased anxiety or stress has been present over the past year.  He does report family history (maternal side) of tremors possibly essential but denies history of Parkinson's disease.  He has remained on Eliquis and atorvastatin without side effects.  Blood pressure today 112/70.  He is currently working with PT for chronic lower back pain.  No further concerns at this time.    ROS:   14 system review of systems performed and negative with exception of those listed in HPI  PMH:  Past Medical History:  Diagnosis Date  . BPH (benign prostatic hypertrophy)   . BPH associated with nocturia  08/29/2013  . BPH with obstruction/lower urinary tract symptoms 08/14/2019  . CAD (coronary artery disease)   . CAD (coronary artery disease), native coronary artery    PTCA of RCA 1986, PTCA of LAD 1992,  Negative treadmill Cardiolite 2011   . Chronic dermatitis of hands 10/12/2015  . Chronic venous  insufficiency   . Coagulation disorder (HCC) 10/26/2018  . DVT (deep venous thrombosis) (HCC)   . Dyslipidemia   . Eczema   . Erectile dysfunction 07/11/2012  . Essential hypertension   . GERD 08/04/2006      . GERD (gastroesophageal reflux disease)   . Hyperglycemia 07/08/2019  . Hyperlipidemia   . Hypertension   . Incomplete emptying of bladder 08/14/2019  . Long term current use of anticoagulant therapy   . Lumbar disc disease   . Overweight (BMI 25.0-29.9) 06/18/2015  . Persistent atrial fibrillation (HCC) 04/10/2018  . Personal history of DVT (deep vein thrombosis)    Initially in 1998 with recurrence in 2002 now on chronic warfarin   . Soft tissue lesion of foot 08/27/2015  . Urinary tract infection with hematuria 08/14/2019    PSH:  Past Surgical History:  Procedure Laterality Date  . CARDIAC CATHETERIZATION    . CARPAL TUNNEL RELEASE  04/21/2011   Procedure: CARPAL TUNNEL RELEASE;  Surgeon: Tami Ribas, MD;  Location: [facility];  Service: Orthopedics;  Laterality: Left;  . CARPAL TUNNEL RELEASE  05/23/2011   Procedure: CARPAL TUNNEL RELEASE;  Surgeon: Tami Ribas, MD;  Location: [facility];  Service: Orthopedics;  Laterality: Right;  . COLON SURGERY  2010 and 2011 colostomy reversal  . CORONARY ANGIOPLASTY    . EYE SURGERY     repair of macular hole  . NASAL SEPTUM SURGERY    . precutaneous transluminal coronary angioplasty    . right hernia    . TONSILLECTOMY AND ADENOIDECTOMY      Social History:  Social History   Socioeconomic History  . Marital status: Married    Spouse name: Not on file  . Number of children: 3  . Years of education: 51  . Highest education level: Not on file  Occupational History  . Occupation: Retired  Tobacco Use  . Smoking status: Former Smoker    Packs/day: 1.00    Years: 60.00    Pack years: 60.00    Types: Cigarettes    Quit date: 03/25/1998    Years since quitting: 22.0  . Smokeless tobacco: Never  Used  Vaping Use  . Vaping Use: Never used  Substance and Sexual Activity  . Alcohol use: Yes    Alcohol/week: 2.0 - 3.0 standard drinks    Types: 2 - 3 Glasses of wine per week    Comment: 1 glass of red wine every 2-3 night   . Drug use: No  . Sexual activity: Not on file  Other Topics Concern  . Not on file  Social History Narrative   Fun: Working outside in yard, cutting wood    Denies abuse and feels safe at home.    Right handed   Lives at home with wife, daughter, granddaughter, and son (periodically)   Social Determinants of Health   Financial Resource Strain: Not on file  Food Insecurity: Not on file  Transportation Needs: Not on file  Physical Activity: Not on file  Stress: Not on file  Social Connections: Not on file  Intimate Partner Violence: Not on file    Family History:  Family History  Problem Relation Age of Onset  . Heart disease Father   . Heart attack Father   . Atrial fibrillation Father   . Heart disease Mother   . Atrial fibrillation Mother   . Stroke Mother   . AAA (abdominal aortic aneurysm) Brother     Medications:   Current Outpatient Medications on File Prior to Visit  Medication Sig Dispense Refill  . acetaminophen (TYLENOL) 650 MG CR tablet Take 650 mg by mouth as needed for pain.    Marland Kitchen amLODipine (NORVASC) 5 MG tablet TAKE 1 TABLET BY MOUTH EVERY OTHER DAY 45 tablet 3  . apixaban (ELIQUIS) 5 MG TABS tablet Take 1 tablet (5 mg total) by mouth 2 (two) times daily. 60 tablet 11  . atenolol (TENORMIN) 100 MG tablet TAKE 1 TABLET BY MOUTH EVERY DAY 90 tablet 1  . atorvastatin (LIPITOR) 40 MG tablet Take 0.5 tablets (20 mg total) by mouth daily. 90 tablet 1  . Coenzyme Q10 (COQ10) 200 MG CAPS Take 1 capsule by mouth daily.     . Cyanocobalamin (VITAMIN B-12) 1000 MCG SUBL Place 1 tablet under the tongue daily.    . hydroxypropyl methylcellulose / hypromellose (ISOPTO TEARS / GONIOVISC) 2.5 % ophthalmic solution Place 1 drop into both eyes 4  (four) times daily as needed for dry eyes.    Marland Kitchen lisinopril (ZESTRIL) 40 MG tablet TAKE 1 TABLET BY MOUTH EVERY DAY 90 tablet 0  . nitroGLYCERIN (NITROSTAT) 0.4 MG SL tablet Dissolve 1 tablet under the tongue every 5 minutes as needed 90 tablet 1  . tamsulosin (FLOMAX) 0.4 MG CAPS capsule Take 0.4 mg by mouth in the morning and at bedtime.     No current facility-administered medications on file prior to visit.    Allergies:   Allergies  Allergen Reactions  . Other Other (See Comments)    Some form of numbing medication used by dentist, almost died  . Simvastatin Other (See Comments)    Muscle aches       OBJECTIVE:  Physical Exam  Vitals:   03/23/20 1317 03/23/20 1322  BP: 112/70 103/70  Pulse: 80 84  Weight: 215 lb (97.5 kg)   Height: 6' (1.829 m)    Body mass index is 29.16 kg/m. No exam data present  Post stroke PHQ 2/9 Depression screen PHQ 2/9 03/23/2020  Decreased Interest 0  Down, Depressed, Hopeless 0  PHQ - 2 Score 0  Some recent data might be hidden     General: well developed, well nourished,  very pleasant elderly Caucasian male, seated, in no evident distress Head: head normocephalic and atraumatic.   Neck: supple with no carotid or supraclavicular bruits Cardiovascular: irregular rate and rhythm, no murmurs Musculoskeletal: no deformity Skin:  no rash/petichiae Vascular:  Normal pulses all extremities   Neurologic Exam Mental Status: Awake and fully alert.   Fluent speech and language.  Oriented to place and time. Recent memory subjectively impaired and remote memory intact. Attention span, concentration and fund of knowledge appropriate. Mood and affect appropriate.  Cranial Nerves: Fundoscopic exam reveals sharp disc margins. Pupils equal, briskly reactive to light. Extraocular movements full without nystagmus. Visual fields full to confrontation. Hearing impaired bilaterally. Facial sensation intact. Face, tongue, palate moves normally and  symmetrically.  Motor: Normal bulk and tone. Normal strength in all tested extremity muscles Sensory.: intact to touch , pinprick , position and vibratory sensation except slightly decreased RLE distally Coordination: Rapid alternating movements normal in all extremities. heel-to-shin  performed accurately bilaterally.  Mild head and BUE tremor with outstretched arms, amplified with finger-to-nose testing.  No evidence of cogwheel rigidity, bradykinesia or resting tremor Gait and Station: Arises from chair without difficulty. Stance is normal. Gait demonstrates normal stride length and balance without use of assistive device.  Good arm swing and no evidence of pill-rolling tremor.  Able to tandem walk and heel toe with only slight difficulty. Reflexes: 1+ and symmetric. Toes downgoing.     NIHSS  0 Modified Rankin  1      ASSESSMENT: William Norman is a 80 y.o. year old male presented with transient aphasia on 02/16/2020 with evidence of left posterior insula and parietal operculum, embolic secondary to known AF off warfarin for outpatient cystoscopy. Vascular risk factors include atrial fibrillation, HTN, HLD, hx of DVT, CAD and advanced age.      PLAN:  1. L MCA stroke :  a. Residual deficit: Occasional word finding difficulty and short-term memory impairment.  Discussed exercises to do at home for hopeful ongoing recovery as well as ways to prevent worsening of cognition b. Continue Eliquis (apixaban) daily  and atorvastatin for secondary stroke prevention.   c. Discussed secondary stroke prevention measures and importance of close PCP follow up for aggressive stroke risk factor management  2. Persistent atrial fibrillation: CHA2DS2-VASc score of at least 5 -during recent stroke admission, switched warfarin to Eliquis for secondary stroke prevention -request ongoing prescribing and monitoring per cardiology 3. HTN: BP goal <130/90.  Stable on amlodipine, atenolol and lisinopril per  PCP/cardiology 4. HLD: LDL goal <70. Recent LDL 86 on atorvastatin 20 mg daily per PCP/cardiology.  5. Tremor: Appears essential type tremor clinically especially with family history.  Symptoms intermittent and do not interfere with daily activity or functioning.  No evidence of Parkinson's at this time.  No indication for medication management as these do not interfere with daily activity or functioning therefore risk (potential side effects) would likely outweigh any benefit.  Will continue to monitor    Follow up in 4 months or call earlier if needed  CC:  [facility] provider: Dr. Julian Reil, [name], MD    I spent 45 minutes of face-to-face and non-face-to-face time with patient.  This included previsit chart review including recent hospitalization pertinent progress notes, lab work and imaging, lab review, study review, order entry, electronic health record documentation, patient education regarding recent stroke including etiology, residual deficits, importance of managing stroke risk factors, tremors and possible etiology as well as potential future treatment options and answered all other questions to patient satisfaction   Frann Rider, AGNP-BC  [facility] [address] [place],  [address]  Phone [phone] Fax [phone] Note: This document was prepared with digital dictation and possible smart phrase technology. Any transcriptional errors that result from this process are unintentional.

## 2020-03-23 NOTE — Patient Instructions (Signed)
Highly recommend brain exercises such as crossword muscles, word search and card games which can help with your short-term memory concerns.  It is also important to manage stroke risk factors, maintaining a healthy diet, routine exercise and ensure adequate sleep to help prevent worsening  Upper extremity and head tremor likely due to essential tremor -no concerning findings of Parkinson's or any other causes at this time.  Additional information provided below.  Continue Eliquis (apixaban) daily  and atorvastatin 20 mg daily for secondary stroke prevention  Continue to follow up with PCP regarding cholesterol and blood pressure management  Maintain strict control of hypertension with blood pressure goal below 130/90 and cholesterol with LDL cholesterol (bad cholesterol) goal below 70 mg/dL.       Followup in the future with me in 4 months or call earlier if needed       Thank you for coming to see [place] at [facility]. I hope we have been able to provide you high quality care today.  You may receive a patient satisfaction survey over the next few weeks. We would appreciate your feedback and comments so that we may continue to improve ourselves and the health of our patients.    Essential Tremor A tremor is trembling or shaking that a person cannot control. Most tremors affect the hands or arms. Tremors can also affect the head, vocal cords, legs, and other parts of the body. Essential tremor is a tremor without a known cause. Usually, it occurs while a person is trying to perform an action. It tends to get worse gradually as a person ages. What are the causes? The cause of this condition is not known. What increases the risk? You are more likely to develop this condition if:  You have a family member with essential tremor.  You are age 8 or older.  You take certain medicines. What are the signs or symptoms? The main sign of a tremor is a rhythmic shaking of  certain parts of your body that is uncontrolled and unintentional. You may:  Have difficulty eating with a spoon or fork.  Have difficulty writing.  Nod your head up and down or side to side.  Have a quivering voice. The shaking may:  Get worse over time.  Come and go.  Be more noticeable on one side of your body.  Get worse due to stress, fatigue, caffeine, and extreme heat or cold. How is this diagnosed? This condition may be diagnosed based on:  Your symptoms and medical history.  A physical exam. There is no single test to diagnose an essential tremor. However, your health care provider may order tests to rule out other causes of your condition. These may include:  Blood and urine tests.  Imaging studies of your brain, such as CT scan and MRI.  A test that measures involuntary muscle movement (electromyogram).   How is this treated? Treatment for essential tremor depends on the severity of the condition.  Some tremors may go away without treatment.  Mild tremors may not need treatment if they do not affect your day-to-day life.  Severe tremors may need to be treated using one or more of the following options: ? Medicines. ? Lifestyle changes. ? Occupational or physical therapy. Follow these instructions at home: Lifestyle  Do not use any products that contain nicotine or tobacco, such as cigarettes and e-cigarettes. If you need help quitting, ask your health care provider.  Limit your caffeine intake as told by your  health care provider.  Try to get 8 hours of sleep each night.  Find ways to manage your stress that fits your lifestyle and personality. Consider trying meditation or yoga.  Try to anticipate stressful situations and allow extra time to manage them.  If you are struggling emotionally with the effects of your tremor, consider working with a mental health provider.   General instructions  Take over-the-counter and prescription medicines only as  told by your health care provider.  Avoid extreme heat and extreme cold.  Keep all follow-up visits as told by your health care provider. This is important. Visits may include physical therapy visits. Contact a health care provider if:  You experience any changes in the location or intensity of your tremors.  You start having a tremor after starting a new medicine.  You have tremor with other symptoms, such as: ? Numbness. ? Tingling. ? Pain. ? Weakness.  Your tremor gets worse.  Your tremor interferes with your daily life.  You feel down, blue, or sad for at least 2 weeks in a row.  Worrying about your tremor and what other people think about you interferes with your everyday life functions, including relationships, work, or school. Summary  Essential tremor is a tremor without a known cause. Usually, it occurs when you are trying to perform an action.  You are more likely to develop this condition if you have a family member with essential tremor.  The main sign of a tremor is a rhythmic shaking of certain parts of your body that is uncontrolled and unintentional.  Treatment for essential tremor depends on the severity of the condition. This information is not intended to replace advice given to you by your health care provider. Make sure you discuss any questions you have with your health care provider. Document Revised: 10/25/2019 Document Reviewed: 10/25/2019 Elsevier Patient Education  2021 [place].     Mild Neurocognitive Disorder Mild neurocognitive disorder, formerly known as mild cognitive impairment, is a disorder in which memory does not work as well as it should. This disorder may also cause problems with other mental functions, including thought, communication, behavior, and completion of tasks. These problems can be noticed and measured, but they usually do not interfere with daily activities or the ability to live independently. Mild neurocognitive  disorder typically develops after 81 years of age, but it can also develop at younger ages. It is not as serious as major neurocognitive disorder, also known as dementia, but it may be the first sign of it. Generally, symptoms of this condition get worse over time. In rare cases, symptoms can get better. What are the causes? This condition may be caused by:  Brain disorders like Alzheimer's disease, Parkinson's disease, and other conditions that gradually damage nerve cells (neurodegenerative conditions).  Diseases that affect blood vessels in the brain and result in small strokes.  Certain infections, such as HIV.  Traumatic brain injury.  Other medical conditions, such as brain tumors, underactive thyroid (hypothyroidism), and vitamin B12 deficiency.  Use of certain drugs or prescription medicines. What increases the risk? The following factors may make you more likely to develop this condition:  Being older than 65 years.  Being male.  Low education level.  Diabetes, high blood pressure, high cholesterol, and other conditions that increase the risk for blood vessel diseases.  Untreated or undertreated sleep apnea.  Having a certain type of gene that can be passed from parent to child (inherited).  Chronic health problems such  as heart disease, lung disease, liver disease, kidney disease, or depression. What are the signs or symptoms? Symptoms of this condition include:  Difficulty remembering. You may: ? Forget names, phone numbers, or details of recent events. ? Forget social events and appointments. ? Repeatedly forget where you put your car keys or other items.  Difficulty thinking and solving problems. You may have trouble with complex tasks, such as: ? Paying bills. ? Driving in unfamiliar places.  Difficulty communicating. You may have trouble: ? Finding the right word or naming an object. ? Forming a sentence that makes sense, or understanding what you read or  hear.  Changes in your behavior or personality. When this happens, you may: ? Lose interest in the things that you used to enjoy. ? Withdraw from social situations. ? Get angry more easily than usual. ? Act before thinking. How is this diagnosed? This condition is diagnosed based on:  Your symptoms. Your health care provider may ask you and the people you spend time with, such as family and friends, about your symptoms.  Evaluation of mental functions (neuropsychological testing). Your health care provider may refer you to a neurologist or mental health specialist to evaluate your mental functions in detail. To identify the cause of your condition, your health care provider may:  Get a detailed medical history.  Ask about use of alcohol, drugs, and prescription medicines.  Do a physical exam.  Order blood tests and brain imaging exams. How is this treated? Mild neurocognitive disorder that is caused by medicine use, drug use, infection, or another medical condition may improve when the cause is treated, or when medicines or drugs are stopped. If this disorder has another cause, it generally does not improve and may get worse. In these cases, the goal of treatment is to help you manage the loss of mental function. Treatments in these cases include:  Medicine. Medicine mainly helps memory and behavior symptoms.  Talk therapy. Talk therapy provides education, emotional support, memory aids, and other ways of making up for problems with mental function.  Lifestyle changes, including: ? Getting regular exercise. ? Eating a healthy diet that includes omega-3 fatty acids. ? Challenging your thinking and memory skills. ? Having more social interaction. Follow these instructions at home: Eating and drinking  Drink enough fluid to keep your urine pale yellow.  Eat a healthy diet that includes omega-3 fatty acids. These can be found in: ? Fish. ? Nuts. ? Leafy vegetables. ? Vegetable  oils.  If you drink alcohol: ? Limit how much you use to:  0-1 drink a day for women.  0-2 drinks a day for men. ? Be aware of how much alcohol is in your drink. In the [place] one drink equals one 12 oz bottle of beer (355 mL), one 5 oz glass of wine (148 mL), or one 1 oz glass of hard liquor (44 mL).   Lifestyle  Get regular exercise as told by your health care provider.  Do not use any products that contain nicotine or tobacco, such as cigarettes, e-cigarettes, and chewing tobacco. If you need help quitting, ask your health care provider.  Practice ways to manage stress. If you need help managing stress, ask your health care provider.  Continue to have social interaction.  Keep your mind active with stimulating activities you enjoy, such as reading or playing games.  Make sure to get quality sleep. Follow these tips: ? Avoid napping during the day. ? Keep your sleeping area dark  and cool. ? Avoid exercising during the few hours before you go to bed. ? Avoid caffeine products in the evening.   General instructions  Take over-the-counter and prescription medicines only as told by your health care provider. Your health care provider may recommend that you avoid taking medicines that can affect thinking, such as pain medicines or sleep medicines.  Work with your health care provider to find out what you need help with and what your safety needs are.  Keep all follow-up visits. This is important. Where to find more information  [employer] on Aging: http://kim-miller.com/ Contact a health care provider if:  You have any new symptoms. Get help right away if:  You develop new confusion or your confusion gets worse.  You act in ways that place you or your family in danger. Summary  Mild neurocognitive disorder is a disorder in which memory does not work as well as it should.  Mild neurocognitive disorder can have many causes. It may be the first stage of dementia.  To  manage your condition, get regular exercise, keep your mind active, get quality sleep, and eat a healthy diet. This information is not intended to replace advice given to you by your health care provider. Make sure you discuss any questions you have with your health care provider. Document Revised: 06/17/2019 Document Reviewed: 06/17/2019 Elsevier Patient Education  [place].

## 2020-03-24 NOTE — Progress Notes (Signed)
I agree with the above plan 

## 2020-03-25 ENCOUNTER — Ambulatory Visit: Payer: Medicare HMO | Admitting: Physical Therapy

## 2020-03-25 ENCOUNTER — Encounter: Payer: Self-pay | Admitting: Physical Therapy

## 2020-03-25 ENCOUNTER — Other Ambulatory Visit: Payer: Self-pay

## 2020-03-25 DIAGNOSIS — M545 Low back pain, unspecified: Secondary | ICD-10-CM

## 2020-03-25 NOTE — Therapy (Signed)
[facility] [address] [place], [place], [address] Phone: [phone]   Fax:  [phone]  Physical Therapy Treatment  Patient Details  Name: William Norman MRN: [id number] Date of Birth: [birth year] Referring Provider (PT): Lynne Leader   Encounter Date: 03/25/2020   PT End of Session - 03/25/20 1559    Visit Number 3    Number of Visits 12    Date for PT Re-Evaluation 04/21/20    Authorization Type [employer]    PT Start Time 0102    PT Stop Time 1428    PT Time Calculation (min) 43 min    Activity Tolerance Patient tolerated treatment well    Behavior During Therapy [facility] for tasks assessed/performed           Past Medical History:  Diagnosis Date  . BPH (benign prostatic hypertrophy)   . BPH associated with nocturia 08/29/2013  . BPH with obstruction/lower urinary tract symptoms 08/14/2019  . CAD (coronary artery disease)   . CAD (coronary artery disease), native coronary artery    PTCA of RCA 1986, PTCA of LAD 1992,  Negative treadmill Cardiolite 2011   . Chronic dermatitis of hands 10/12/2015  . Chronic venous insufficiency   . Coagulation disorder ([place]) 10/26/2018  . DVT (deep venous thrombosis) ([place])   . Dyslipidemia   . Eczema   . Erectile dysfunction 07/11/2012  . Essential hypertension   . GERD 08/04/2006      . GERD (gastroesophageal reflux disease)   . Hyperglycemia 07/08/2019  . Hyperlipidemia   . Hypertension   . Incomplete emptying of bladder 08/14/2019  . Long term current use of anticoagulant therapy   . Lumbar disc disease   . Overweight (BMI 25.0-29.9) 06/18/2015  . Persistent atrial fibrillation ([place]) 04/10/2018  . Personal history of DVT (deep vein thrombosis)    Initially in 1998 with recurrence in 2002 now on chronic warfarin   . Soft tissue lesion of foot 08/27/2015  . Urinary tract infection with hematuria 08/14/2019    Past Surgical History:  Procedure Laterality Date  . CARDIAC  CATHETERIZATION    . CARPAL TUNNEL RELEASE  04/21/2011   Procedure: CARPAL TUNNEL RELEASE;  Surgeon: Tennis Must, MD;  Location: [facility];  Service: Orthopedics;  Laterality: Left;  . CARPAL TUNNEL RELEASE  05/23/2011   Procedure: CARPAL TUNNEL RELEASE;  Surgeon: Tennis Must, MD;  Location: [facility];  Service: Orthopedics;  Laterality: Right;  . COLON SURGERY  2010 and 2011 colostomy reversal  . CORONARY ANGIOPLASTY    . EYE SURGERY     repair of macular hole  . NASAL SEPTUM SURGERY    . precutaneous transluminal coronary angioplasty    . right hernia    . TONSILLECTOMY AND ADENOIDECTOMY      There were no vitals filed for this visit.   Subjective Assessment - 03/25/20 1558    Subjective Pt state minimal pain today. Does note pain on R side of back with prolonged standing/walking. Did do HEP.    Currently in Pain? Yes    Pain Score 3     Pain Location Back    Pain Orientation Right    Pain Descriptors / Indicators Aching    Pain Type Acute pain    Pain Onset More than a month ago    Pain Frequency Intermittent  [place] Adult PT Treatment/Exercise - 03/25/20 1348      Exercises   Exercises Lumbar      Lumbar Exercises: Stretches   Active Hamstring Stretch 3 reps;30 seconds    Active Hamstring Stretch Limitations seated    Single Knee to Chest Stretch 2 reps;30 seconds    Single Knee to Chest Stretch Limitations bil    Lower Trunk Rotation 5 reps;10 seconds    Piriformis Stretch 3 reps;30 seconds    Piriformis Stretch Limitations supine mod fig 4;      Lumbar Exercises: Aerobic   Recumbent Bike L1 x 8 min;      Lumbar Exercises: Supine   Pelvic Tilt 15 reps    Bent Knee Raise 20 reps    Straight Leg Raise 20 reps    Other Supine Lumbar Exercises Clam GTB x 20      Lumbar Exercises: Sidelying   Hip Abduction 10 reps;Both      Manual Therapy   Manual Therapy Soft tissue  mobilization;Manual Traction;Joint mobilization    Joint Mobilization Lumbar PA mobs and SI mobs  gr 3    Soft tissue mobilization --    Manual Traction long leg distraction R for lumbar pump x 2 min                    PT Short Term Goals - 03/22/20 1455      PT SHORT TERM GOAL #1   Title Pt to be independent with initial HEP    Time 2    Period Weeks    Status New    Target Date 03/24/20             PT Long Term Goals - 03/22/20 1456      PT LONG TERM GOAL #1   Title Pt to be independent with final HEP    Time 6    Period Weeks    Status New    Target Date 04/21/20      PT LONG TERM GOAL #2   Title Pt to demo lumbar ROM to be [facility] for age, without pain. to improve ability for IADLS.    Time 6    Period Weeks    Status New    Target Date 04/21/20      PT LONG TERM GOAL #3   Title Pt to demo optimal posture for bend, lift, squat, to improve safety and pain with outdoor activities and IADLS.    Time 6    Period Weeks    Status New    Target Date 04/21/20      PT LONG TERM GOAL #4   Title Pt to report decreased pain in R side of low bck to 0-2/10 with activity    Time 6    Period Weeks    Status New    Target Date 04/21/20                 Plan - 03/25/20 1600    Clinical Impression Statement Pt with minimal pain with activities or palaption today. Pt to benefit from continued strengthening as tolerated.    Examination-Activity Limitations Locomotion Level;Bend;Squat;Stand;Lift    Examination-Participation Restrictions Community Activity;Yard Work;Cleaning    Stability/Clinical Decision Making Stable/Uncomplicated    Rehab Potential Good    PT Frequency 2x / week    PT Duration 6 weeks    PT Treatment/Interventions ADLs/Self Care Home Management;Cryotherapy;Electrical Stimulation;Ultrasound;Traction;Moist Heat;Iontophoresis 4mg /ml Dexamethasone;Gait training;Stair training;Functional mobility training;Therapeutic activities;Therapeutic  exercise;Balance training;Patient/family  education;Neuromuscular re-education;Manual techniques;Passive range of motion;Taping;Dry needling;Spinal Manipulations;Joint Manipulations    PT Home Exercise Plan [email or web address]    Consulted and Agree with Plan of Care Patient           Patient will benefit from skilled therapeutic intervention in order to improve the following deficits and impairments:  Decreased range of motion,Increased muscle spasms,Decreased activity tolerance,Pain,Hypomobility,Impaired flexibility,Improper body mechanics,Decreased mobility,Decreased strength  Visit Diagnosis: Acute right-sided low back pain without sciatica     Problem List Patient Active Problem List   Diagnosis Date Noted  . AAA (abdominal aortic aneurysm) ([place]) 03/06/2020  . Porokeratosis 02/25/2020  . Stroke ([place]) 02/16/2020  . Hypertension   . Hyperlipidemia   . GERD (gastroesophageal reflux disease)   . Eczema   . DVT (deep venous thrombosis) ([place])   . CAD (coronary artery disease)   . BPH with obstruction/lower urinary tract symptoms 08/14/2019  . Incomplete emptying of bladder 08/14/2019  . Urinary tract infection with hematuria 08/14/2019  . Hyperglycemia 07/08/2019  . Coagulation disorder ([place]) 10/26/2018  . Persistent atrial fibrillation ([place]) 04/10/2018  . Chronic dermatitis of hands 10/12/2015  . Soft tissue lesion of foot 08/27/2015  . Overweight (BMI 25.0-29.9) 06/18/2015  . BPH associated with nocturia 08/29/2013  . CAD (coronary artery disease), native coronary artery   . Long term current use of anticoagulant therapy   . Personal history of DVT (deep vein thrombosis)   . Chronic venous insufficiency   . Erectile dysfunction 07/11/2012  . Lumbar disc disease   . GERD 08/04/2006  . Dyslipidemia   . Essential hypertension     Lyndee Hensen, PT, DPT 4:01 PM  03/25/20    [facility] [place], [place],  [address] Phone: [phone]   Fax:  [phone]  Name: William Norman MRN: [id number] Date of Birth: Jun 24, 1940

## 2020-03-31 ENCOUNTER — Telehealth: Payer: Self-pay

## 2020-03-31 ENCOUNTER — Encounter: Payer: Medicare HMO | Admitting: Physical Therapy

## 2020-03-31 ENCOUNTER — Other Ambulatory Visit: Payer: Self-pay | Admitting: *Deleted

## 2020-03-31 ENCOUNTER — Other Ambulatory Visit: Payer: Self-pay | Admitting: Family Medicine

## 2020-03-31 ENCOUNTER — Encounter: Payer: Self-pay | Admitting: Physical Therapy

## 2020-03-31 ENCOUNTER — Ambulatory Visit: Payer: Medicare HMO | Admitting: Physical Therapy

## 2020-03-31 ENCOUNTER — Other Ambulatory Visit: Payer: Self-pay

## 2020-03-31 DIAGNOSIS — Z7901 Long term (current) use of anticoagulants: Secondary | ICD-10-CM

## 2020-03-31 DIAGNOSIS — M545 Low back pain, unspecified: Secondary | ICD-10-CM | POA: Diagnosis not present

## 2020-03-31 NOTE — Telephone Encounter (Signed)
MEDICATION:   apixaban (ELIQUIS) 5 MG TABS tablet  tamsulosin (FLOMAX) 0.4 MG CAPS capsule    PHARMACY:  [id number] [address], [place] Phone:  [phone]  Fax:  [phone]        Comments:   **Let patient know to contact pharmacy at the end of the day to make sure medication is ready. **  ** Please notify patient to allow 48-72 hours to process**  **Encourage patient to contact the pharmacy for refills or they can request refills through [facility]**

## 2020-03-31 NOTE — Telephone Encounter (Signed)
Pt requesting refills.

## 2020-03-31 NOTE — Telephone Encounter (Signed)
Patient stated Rx Eliquis is prescribed by neurologies

## 2020-03-31 NOTE — Telephone Encounter (Signed)
Patient is on eliquis. He should not be taking the warfarin any longer.  William Norman. [name], MD 03/31/2020 9:11 AM

## 2020-03-31 NOTE — Telephone Encounter (Signed)
Ok with flomax. Make sure his cardiologist is not prescribing his eliquis.  William Norman. William Pain, MD 03/31/2020 3:58 PM

## 2020-03-31 NOTE — Telephone Encounter (Signed)
Requesting refills Rx Eliquis refill on 02/17/2020 with 11 refills by Dr Darrick Meigs Rx Flomax done by historical provider

## 2020-03-31 NOTE — Therapy (Signed)
[facility] [address] [place], [place], [address] Phone: [phone]   Fax:  [phone]  Physical Therapy Treatment  Patient Details  Name: William Norman MRN: [id number] Date of Birth: 1940-06-24 Referring Provider (PT): Lynne Leader   Encounter Date: 03/31/2020   PT End of Session - 03/31/20 1514    Visit Number 4    Number of Visits 12    Date for PT Re-Evaluation 04/21/20    Authorization Type [employer] Medicare    PT Start Time 4193    PT Stop Time 1517    PT Time Calculation (min) 44 min    Activity Tolerance Patient tolerated treatment well    Behavior During Therapy [facility] for tasks assessed/performed           Past Medical History:  Diagnosis Date  . BPH (benign prostatic hypertrophy)   . BPH associated with nocturia 08/29/2013  . BPH with obstruction/lower urinary tract symptoms 08/14/2019  . CAD (coronary artery disease)   . CAD (coronary artery disease), native coronary artery    PTCA of RCA 1986, PTCA of LAD 1992,  Negative treadmill Cardiolite 2011   . Chronic dermatitis of hands 10/12/2015  . Chronic venous insufficiency   . Coagulation disorder ([place]) 10/26/2018  . DVT (deep venous thrombosis) ([place])   . Dyslipidemia   . Eczema   . Erectile dysfunction 07/11/2012  . Essential hypertension   . GERD 08/04/2006      . GERD (gastroesophageal reflux disease)   . Hyperglycemia 07/08/2019  . Hyperlipidemia   . Hypertension   . Incomplete emptying of bladder 08/14/2019  . Long term current use of anticoagulant therapy   . Lumbar disc disease   . Overweight (BMI 25.0-29.9) 06/18/2015  . Persistent atrial fibrillation ([place]) 04/10/2018  . Personal history of DVT (deep vein thrombosis)    Initially in 1998 with recurrence in 2002 now on chronic warfarin   . Soft tissue lesion of foot 08/27/2015  . Urinary tract infection with hematuria 08/14/2019    Past Surgical History:  Procedure Laterality Date  . CARDIAC  CATHETERIZATION    . CARPAL TUNNEL RELEASE  04/21/2011   Procedure: CARPAL TUNNEL RELEASE;  Surgeon: Tennis Must, MD;  Location: [facility];  Service: Orthopedics;  Laterality: Left;  . CARPAL TUNNEL RELEASE  05/23/2011   Procedure: CARPAL TUNNEL RELEASE;  Surgeon: Tennis Must, MD;  Location: [facility];  Service: Orthopedics;  Laterality: Right;  . COLON SURGERY  2010 and 2011 colostomy reversal  . CORONARY ANGIOPLASTY    . EYE SURGERY     repair of macular hole  . NASAL SEPTUM SURGERY    . precutaneous transluminal coronary angioplasty    . right hernia    . TONSILLECTOMY AND ADENOIDECTOMY      There were no vitals filed for this visit.   Subjective Assessment - 03/31/20 1440    Subjective Pt states he was very sore for about 2 days after last session but feels better today. Pt reports mixed compliance with HEP.                             [facility] Adult PT Treatment/Exercise - 03/31/20 0001      Exercises   Exercises Lumbar      Lumbar Exercises: Stretches   Active Hamstring Stretch 3 reps;30 seconds    Active Hamstring Stretch Limitations seated    Single  Knee to Chest Stretch 2 reps;30 seconds    Single Knee to Chest Stretch Limitations bil    Lower Trunk Rotation 5 reps;10 seconds    Piriformis Stretch 3 reps;30 seconds    Piriformis Stretch Limitations supine mod fig 4;      Lumbar Exercises: Aerobic   Recumbent Bike L1 x 8 min;      Lumbar Exercises: Standing   Functional Squats 10 reps    Functional Squats Limitations 2 sets, green band at knees      Lumbar Exercises: Supine   Pelvic Tilt 10 reps    Bent Knee Raise --    Straight Leg Raise --    Other Supine Lumbar Exercises --    Other Supine Lumbar Exercises Bridge with band ABD 2x10      Lumbar Exercises: Sidelying   Hip Abduction 10 reps;Both      Manual Therapy   Manual Therapy Soft tissue mobilization;Manual Traction;Joint mobilization    Joint  Mobilization Lumbar PA mobs and SI mobs  grade III    Soft tissue mobilization bilat lumbar paraspinals and QL    Manual Traction --                  PT Education - 03/31/20 1441    Education Details HEP review, exercise progression, anatomy    Person(s) Educated Patient    Methods Explanation;Demonstration;Handout;Verbal cues    Comprehension Verbalized understanding;Returned demonstration;Verbal cues required;Tactile cues required;Need further instruction            PT Short Term Goals - 03/22/20 1455      PT SHORT TERM GOAL #1   Title Pt to be independent with initial HEP    Time 2    Period Weeks    Status New    Target Date 03/24/20             PT Long Term Goals - 03/22/20 1456      PT LONG TERM GOAL #1   Title Pt to be independent with final HEP    Time 6    Period Weeks    Status New    Target Date 04/21/20      PT LONG TERM GOAL #2   Title Pt to demo lumbar ROM to be [facility] for age, without pain. to improve ability for IADLS.    Time 6    Period Weeks    Status New    Target Date 04/21/20      PT LONG TERM GOAL #3   Title Pt to demo optimal posture for bend, lift, squat, to improve safety and pain with outdoor activities and IADLS.    Time 6    Period Weeks    Status New    Target Date 04/21/20      PT LONG TERM GOAL #4   Title Pt to report decreased pain in R side of low bck to 0-2/10 with activity    Time 6    Period Weeks    Status New    Target Date 04/21/20                 Plan - 03/31/20 1515    Clinical Impression Statement Pt demonstrates improved ability to tolerate loaded CKC and OKC exercises at today's session with heavy cuing for form and technique. Due to pt's report of minimal compliance with HEP, pt's session focused on review technique,form, and importance of HEP. Pt was provided printed handout of new strengthening exercise  and pt gave verbal understanding of prescribed parameters. Pt will likely need review of STS  and bridge at next session. Pt would benefit from continued skilled therapy in order to progress towards goals and address functional deficits for prevention of further functional decline.    Examination-Activity Limitations Locomotion Level;Bend;Squat;Stand;Lift    Examination-Participation Restrictions Community Activity;Yard Work;Cleaning    Stability/Clinical Decision Making Stable/Uncomplicated    Rehab Potential Good    PT Frequency 2x / week    PT Duration 6 weeks    PT Treatment/Interventions ADLs/Self Care Home Management;Cryotherapy;Electrical Stimulation;Ultrasound;Traction;Moist Heat;Iontophoresis 4mg /ml Dexamethasone;Gait training;Stair training;Functional mobility training;Therapeutic activities;Therapeutic exercise;Balance training;Patient/family education;Neuromuscular re-education;Manual techniques;Passive range of motion;Taping;Dry needling;Spinal Manipulations;Joint Manipulations    PT Home Exercise Plan [email or web address]    Consulted and Agree with Plan of Care Patient           Patient will benefit from skilled therapeutic intervention in order to improve the following deficits and impairments:  Decreased range of motion,Increased muscle spasms,Decreased activity tolerance,Pain,Hypomobility,Impaired flexibility,Improper body mechanics,Decreased mobility,Decreased strength  Visit Diagnosis: Acute right-sided low back pain without sciatica     Problem List Patient Active Problem List   Diagnosis Date Noted  . AAA (abdominal aortic aneurysm) ([place]) 03/06/2020  . Porokeratosis 02/25/2020  . Stroke ([place]) 02/16/2020  . Hypertension   . Hyperlipidemia   . GERD (gastroesophageal reflux disease)   . Eczema   . DVT (deep venous thrombosis) ([place])   . CAD (coronary artery disease)   . BPH with obstruction/lower urinary tract symptoms 08/14/2019  . Incomplete emptying of bladder 08/14/2019  . Urinary tract infection with hematuria 08/14/2019  . Hyperglycemia 07/08/2019  .  Coagulation disorder ([place]) 10/26/2018  . Persistent atrial fibrillation ([place]) 04/10/2018  . Chronic dermatitis of hands 10/12/2015  . Soft tissue lesion of foot 08/27/2015  . Overweight (BMI 25.0-29.9) 06/18/2015  . BPH associated with nocturia 08/29/2013  . CAD (coronary artery disease), native coronary artery   . Long term current use of anticoagulant therapy   . Personal history of DVT (deep vein thrombosis)   . Chronic venous insufficiency   . Erectile dysfunction 07/11/2012  . Lumbar disc disease   . GERD 08/04/2006  . Dyslipidemia   . Essential hypertension     Daleen Bo PT, DPT 03/31/20 4:30 PM   [facility] [address] [place], [place], [address] Phone: [phone]   Fax:  [phone]  Name: William Norman MRN: [id number] Date of Birth: [birth year]

## 2020-04-01 ENCOUNTER — Other Ambulatory Visit: Payer: Self-pay | Admitting: *Deleted

## 2020-04-01 MED ORDER — TAMSULOSIN HCL 0.4 MG PO CAPS: 0.4000 mg | ORAL_CAPSULE | Freq: Two times a day (BID) | ORAL | 1 refills | Status: DC

## 2020-04-01 NOTE — Telephone Encounter (Signed)
Patient in our clinic notified Rx Flomax was send  Patient unsure who prescribed Eliquis will call his Cardiology and Neurologies

## 2020-04-02 ENCOUNTER — Encounter: Payer: Medicare HMO | Admitting: Physical Therapy

## 2020-04-02 ENCOUNTER — Telehealth: Payer: Self-pay | Admitting: Neurology

## 2020-04-02 NOTE — Telephone Encounter (Signed)
Patient walked into the lobby stating that he needs a 90-day supply of Eliquis 5 mg to [facility]. Best call back [phone]

## 2020-04-02 NOTE — Telephone Encounter (Signed)
Called pt and he will p/u at [facility] [address] that was transferred from [facility].  If a problem will call cardiology.

## 2020-04-07 ENCOUNTER — Other Ambulatory Visit: Payer: Self-pay

## 2020-04-07 ENCOUNTER — Ambulatory Visit: Payer: Medicare HMO | Admitting: Physical Therapy

## 2020-04-07 ENCOUNTER — Encounter: Payer: Self-pay | Admitting: Physical Therapy

## 2020-04-07 DIAGNOSIS — M545 Low back pain, unspecified: Secondary | ICD-10-CM | POA: Diagnosis not present

## 2020-04-07 NOTE — Therapy (Signed)
[facility] [address] [place], [place], [address] Phone: [phone]   Fax:  [phone]  Physical Therapy Treatment/Discharge  Patient Details  Name: William Norman MRN: [id number] Date of Birth: Jun 23, 1940 Referring Provider (PT): Lynne Leader   Encounter Date: 04/07/2020   PT End of Session - 04/07/20 1413    Visit Number 5    Number of Visits 12    Date for PT Re-Evaluation 04/21/20    Authorization Type Aetna Medicare    PT Start Time 1350    PT Stop Time 1430    PT Time Calculation (min) 40 min    Activity Tolerance Patient tolerated treatment well    Behavior During Therapy [facility] for tasks assessed/performed           Past Medical History:  Diagnosis Date  . BPH (benign prostatic hypertrophy)   . BPH associated with nocturia 08/29/2013  . BPH with obstruction/lower urinary tract symptoms 08/14/2019  . CAD (coronary artery disease)   . CAD (coronary artery disease), native coronary artery    PTCA of RCA 1986, PTCA of LAD 1992,  Negative treadmill Cardiolite 2011   . Chronic dermatitis of hands 10/12/2015  . Chronic venous insufficiency   . Coagulation disorder ([place]) 10/26/2018  . DVT (deep venous thrombosis) ([place])   . Dyslipidemia   . Eczema   . Erectile dysfunction 07/11/2012  . Essential hypertension   . GERD 08/04/2006      . GERD (gastroesophageal reflux disease)   . Hyperglycemia 07/08/2019  . Hyperlipidemia   . Hypertension   . Incomplete emptying of bladder 08/14/2019  . Long term current use of anticoagulant therapy   . Lumbar disc disease   . Overweight (BMI 25.0-29.9) 06/18/2015  . Persistent atrial fibrillation ([place]) 04/10/2018  . Personal history of DVT (deep vein thrombosis)    Initially in 1998 with recurrence in 2002 now on chronic warfarin   . Soft tissue lesion of foot 08/27/2015  . Urinary tract infection with hematuria 08/14/2019    Past Surgical History:  Procedure Laterality Date  .  CARDIAC CATHETERIZATION    . CARPAL TUNNEL RELEASE  04/21/2011   Procedure: CARPAL TUNNEL RELEASE;  Surgeon: Tennis Must, MD;  Location: [facility];  Service: Orthopedics;  Laterality: Left;  . CARPAL TUNNEL RELEASE  05/23/2011   Procedure: CARPAL TUNNEL RELEASE;  Surgeon: Tennis Must, MD;  Location: [facility];  Service: Orthopedics;  Laterality: Right;  . COLON SURGERY  2010 and 2011 colostomy reversal  . CORONARY ANGIOPLASTY    . EYE SURGERY     repair of macular hole  . NASAL SEPTUM SURGERY    . precutaneous transluminal coronary angioplasty    . right hernia    . TONSILLECTOMY AND ADENOIDECTOMY      There were no vitals filed for this visit.   Subjective Assessment - 04/07/20 1412    Subjective Pt states increased pain when doing activity outoors, up to 5/10 but Thinks back is doing better. He is not doing HEP. Pt seeing MD tomorrow. He feels his back is doing better and requests d/c.    Currently in Pain? Yes    Pain Score 3     Pain Location Back    Pain Orientation Right    Pain Descriptors / Indicators Aching    Pain Type Acute pain    Pain Onset More than a month ago    Pain Frequency Intermittent  [facility] Adult PT Treatment/Exercise - 04/07/20 1358      Exercises   Exercises Lumbar      Lumbar Exercises: Stretches   Active Hamstring Stretch 3 reps;30 seconds    Active Hamstring Stretch Limitations seated    Single Knee to Chest Stretch 2 reps;30 seconds    Single Knee to Chest Stretch Limitations bil    Lower Trunk Rotation 5 reps;10 seconds    Piriformis Stretch 3 reps;30 seconds    Piriformis Stretch Limitations supine mod fig 4;      Lumbar Exercises: Aerobic   Recumbent Bike L1 x 8 min;      Lumbar Exercises: Standing   Functional Squats 10 reps    Functional Squats Limitations 2 sets, green band at knees      Lumbar Exercises: Supine   Pelvic Tilt 10 reps    Other Supine  Lumbar Exercises --      Lumbar Exercises: Sidelying   Hip Abduction Both;15 reps      Manual Therapy   Manual Therapy Soft tissue mobilization;Manual Traction;Joint mobilization    Joint Mobilization Lumbar PA mobs and SI mobs  grade III    Soft tissue mobilization --                  PT Education - 04/07/20 1433    Education Details Final HEP reviewed,    Person(s) Educated Patient    Methods Explanation;Demonstration;Tactile cues;Verbal cues;Handout    Comprehension Verbalized understanding;Returned demonstration;Verbal cues required;Tactile cues required            PT Short Term Goals - 04/07/20 1434      PT SHORT TERM GOAL #1   Title Pt to be independent with initial HEP    Time 2    Period Weeks    Status Achieved    Target Date 03/24/20             PT Long Term Goals - 04/07/20 1434      PT LONG TERM GOAL #1   Title Pt to be independent with final HEP    Time 6    Period Weeks    Status Achieved      PT LONG TERM GOAL #2   Title Pt to demo lumbar ROM to be [facility] for age, without pain. to improve ability for IADLS.    Time 6    Period Weeks    Status Partially Met      PT LONG TERM GOAL #3   Title Pt to demo optimal posture for bend, lift, squat, to improve safety and pain with outdoor activities and IADLS.    Time 6    Period Weeks    Status Achieved      PT LONG TERM GOAL #4   Title Pt to report decreased pain in R side of low bck to 0-2/10 with activity    Time 6    Period Weeks    Status Partially Met                 Plan - 04/07/20 1435    Clinical Impression Statement Pt continues to have variable pain depending on amount of standing and walking he does. Pt req mod verbal cues to perform exercises correctly today. THer ex performed for lumbar ROM , pain, and strengthening. Reviewed final HEP, Discussed importance of HEP to reduce pain. Pt would likely benefit from continued sessions, but pt feels he has made good improvments  and requests d/c.  Examination-Activity Limitations Locomotion Level;Bend;Squat;Stand;Lift    Examination-Participation Restrictions Community Activity;Yard Work;Cleaning    Stability/Clinical Decision Making Stable/Uncomplicated    Rehab Potential Good    PT Frequency 2x / week    PT Duration 6 weeks    PT Treatment/Interventions ADLs/Self Care Home Management;Cryotherapy;Electrical Stimulation;Ultrasound;Traction;Moist Heat;Iontophoresis 58m/ml Dexamethasone;Gait training;Stair training;Functional mobility training;Therapeutic activities;Therapeutic exercise;Balance training;Patient/family education;Neuromuscular re-education;Manual techniques;Passive range of motion;Taping;Dry needling;Spinal Manipulations;Joint Manipulations    PT Home Exercise Plan [email or web address]    Consulted and Agree with Plan of Care Patient           Patient will benefit from skilled therapeutic intervention in order to improve the following deficits and impairments:  Decreased range of motion,Increased muscle spasms,Decreased activity tolerance,Pain,Hypomobility,Impaired flexibility,Improper body mechanics,Decreased mobility,Decreased strength  Visit Diagnosis: Acute right-sided low back pain without sciatica     Problem List Patient Active Problem List   Diagnosis Date Noted  . AAA (abdominal aortic aneurysm) ([place] 03/06/2020  . Porokeratosis 02/25/2020  . Stroke ([place] 02/16/2020  . Hypertension   . Hyperlipidemia   . GERD (gastroesophageal reflux disease)   . Eczema   . DVT (deep venous thrombosis) ([place]   . CAD (coronary artery disease)   . BPH with obstruction/lower urinary tract symptoms 08/14/2019  . Incomplete emptying of bladder 08/14/2019  . Urinary tract infection with hematuria 08/14/2019  . Hyperglycemia 07/08/2019  . Coagulation disorder ([place] 10/26/2018  . Persistent atrial fibrillation ([place] 04/10/2018  . Chronic dermatitis of hands 10/12/2015  . Soft tissue lesion of foot 08/27/2015  .  Overweight (BMI 25.0-29.9) 06/18/2015  . BPH associated with nocturia 08/29/2013  . CAD (coronary artery disease), native coronary artery   . Long term current use of anticoagulant therapy   . Personal history of DVT (deep vein thrombosis)   . Chronic venous insufficiency   . Erectile dysfunction 07/11/2012  . Lumbar disc disease   . GERD 08/04/2006  . Dyslipidemia   . Essential hypertension     LLyndee Hensen2/22/2022, 2:37 PM  [place] [place] [address]Phone: [phone]  Fax:  [phone] Name: RUnknown SchleyerMRN: [id number] of Birth: 1August 18, 1942  PHYSICAL THERAPY DISCHARGE SUMMARY  Visits from Start of Care:5 Plan: Patient agrees to discharge.  Patient goals were partially met. Patient is being discharged due to meeting the stated rehab goals.  ?????     LLyndee Hensen PT, DPT 2:37 PM  04/07/20

## 2020-04-07 NOTE — Progress Notes (Signed)
I, Peterson Lombard, LAT, ATC acting as a scribe for Lynne Leader, MD.  William Norman is a 80 y.o. male who presents to [place] at [facility] today for f/u LBP. Pt was last seen by Dr. Georgina Snell on 02/26/20 and was advised to try Tylenol, heating pad, TENS unit and was referred to PT of which he's completed 4 visits. Today, pt reports that he continues to have pain with walking but does feel like his pain ins improving. Patient is most comfortable with slouching in a chair but he is now trying to sit up straight.   Radiating pn: no LE numbness/tingling: yes LE weakness: yes Aggravates: walking Rx tried: rest  Dx imaging: 02/26/20 L-spine XR  02/15/07 L-spine XR  Pertinent review of systems: No fevers or chills  Relevant historical information: Hypertension   Exam:  BP 118/82   Pulse 78   Ht 6' (1.829 m)   Wt 220 lb (99.8 kg)   SpO2 97%   BMI 29.84 kg/m  General: Well Developed, well nourished, and in no acute distress.   MSK: L-spine normal motion.  Normal gait.    Lab and Radiology Results CT ANGIO HEAD W OR WO CONTRAST  Result Date: 02/16/2020 CLINICAL DATA:  Stroke/TIA EXAM: CT ANGIOGRAPHY HEAD AND NECK TECHNIQUE: Multidetector CT imaging of the head and neck was performed using the standard protocol during bolus administration of intravenous contrast. Multiplanar CT image reconstructions and MIPs were obtained to evaluate the vascular anatomy. Carotid stenosis measurements (when applicable) are obtained utilizing NASCET criteria, using the distal internal carotid diameter as the denominator. CONTRAST:  5mL OMNIPAQUE IOHEXOL 350 MG/ML SOLN COMPARISON:  None. FINDINGS: CTA NECK FINDINGS SKELETON: There is no bony spinal canal stenosis. No lytic or blastic lesion. OTHER NECK: Normal pharynx, larynx and major salivary glands. No cervical lymphadenopathy. Unremarkable thyroid gland. UPPER CHEST: Biapical emphysema. AORTIC ARCH: There is calcific  atherosclerosis of the aortic arch. There is no aneurysm, dissection or hemodynamically significant stenosis of the visualized portion of the aorta. Conventional 3 vessel aortic branching pattern. The visualized proximal subclavian arteries are widely patent. RIGHT CAROTID SYSTEM: No dissection, occlusion or aneurysm. Mild atherosclerotic calcification at the carotid bifurcation without hemodynamically significant stenosis. LEFT CAROTID SYSTEM: No dissection, occlusion or aneurysm. Mild atherosclerotic calcification at the carotid bifurcation without hemodynamically significant stenosis. VERTEBRAL ARTERIES: Codominant configuration. Both vertebral artery origins are normal. There is no dissection, occlusion or flow-limiting stenosis to the skull base (V1-V3 segments). CTA HEAD FINDINGS POSTERIOR CIRCULATION: --Vertebral arteries: Normal V4 segments. --Inferior cerebellar arteries: Normal. --Basilar artery: Normal. --Superior cerebellar arteries: Normal. --Posterior cerebral arteries (PCA): Normal. ANTERIOR CIRCULATION: --Intracranial internal carotid arteries: Normal. --Anterior cerebral arteries (ACA): Normal. Both A1 segments are present. Patent anterior communicating artery (a-comm). --Middle cerebral arteries (MCA): Normal. VENOUS SINUSES: As permitted by contrast timing, patent. ANATOMIC VARIANTS: Fetal origin of the left posterior cerebral artery. Review of the MIP images confirms the above findings. IMPRESSION: 1. No emergent large vessel occlusion or hemodynamically significant stenosis of the head or neck. Aortic Atherosclerosis (ICD10-I70.0) and Emphysema (ICD10-J43.9). Electronically Signed   By: Ulyses Jarred M.D.   On: 02/16/2020 22:33   DG Lumbar Spine 2-3 Views  Result Date: 02/26/2020 CLINICAL DATA:  Low back pain EXAM: LUMBAR SPINE - 2-3 VIEW COMPARISON:  February 13, 2007 FINDINGS: Multilevel degenerative changes are noted throughout the lumbar spine that are mild-to-moderate in severity. These  are greatest in the lower lumbar segments. There is no acute compression fracture.  Bridging anterior osteophytes are noted compatible with DISH. There is facet arthrosis in the lower lumbar segments. There is no significant malalignment. There are advanced vascular calcifications of the abdominal aorta. The infrarenal abdominal aorta appears to be aneurysmal measuring approximately 3.3 cm in diameter. IMPRESSION: 1. No acute osseous abnormality. 2. Degenerative changes of the lumbar spine as detailed above. 3. Advanced vascular calcifications of the abdominal aorta with a possible abdominal aortic aneurysm. Follow-up with outpatient ultrasound is recommended. Electronically Signed   By: Constance Holster M.D.   On: 02/26/2020 21:08   CT HEAD WO CONTRAST  Result Date: 02/16/2020 CLINICAL DATA:  Transient ischemic attack. Expressive aphasia, left leg weakness, resolved prior to arrival. EXAM: CT HEAD WITHOUT CONTRAST TECHNIQUE: Contiguous axial images were obtained from the base of the skull through the vertex without intravenous contrast. COMPARISON:  None. FINDINGS: Brain: Diffuse cerebral atrophy. Mild ventricular dilatation consistent with central atrophy. Patchy low-attenuation changes throughout the deep white matter most consistent with small vessel ischemia. No mass-effect or midline shift. No abnormal extra-axial fluid collections. Gray-white matter junctions are distinct. Basal cisterns are not effaced. No acute intracranial hemorrhage. Vascular: No hyperdense vessel or unexpected calcification. Skull: Calvarium appears intact. Sinuses/Orbits: Retention cyst in the left maxillary antrum. Paranasal sinuses and mastoid air cells are otherwise clear. Other: None. IMPRESSION: 1. No acute intracranial abnormalities. 2. Chronic atrophy and small vessel ischemia. Electronically Signed   By: Lucienne Capers M.D.   On: 02/16/2020 16:47   CT ANGIO NECK W OR WO CONTRAST  Result Date: 02/16/2020 CLINICAL DATA:   Stroke/TIA EXAM: CT ANGIOGRAPHY HEAD AND NECK TECHNIQUE: Multidetector CT imaging of the head and neck was performed using the standard protocol during bolus administration of intravenous contrast. Multiplanar CT image reconstructions and MIPs were obtained to evaluate the vascular anatomy. Carotid stenosis measurements (when applicable) are obtained utilizing NASCET criteria, using the distal internal carotid diameter as the denominator. CONTRAST:  68mL OMNIPAQUE IOHEXOL 350 MG/ML SOLN COMPARISON:  None. FINDINGS: CTA NECK FINDINGS SKELETON: There is no bony spinal canal stenosis. No lytic or blastic lesion. OTHER NECK: Normal pharynx, larynx and major salivary glands. No cervical lymphadenopathy. Unremarkable thyroid gland. UPPER CHEST: Biapical emphysema. AORTIC ARCH: There is calcific atherosclerosis of the aortic arch. There is no aneurysm, dissection or hemodynamically significant stenosis of the visualized portion of the aorta. Conventional 3 vessel aortic branching pattern. The visualized proximal subclavian arteries are widely patent. RIGHT CAROTID SYSTEM: No dissection, occlusion or aneurysm. Mild atherosclerotic calcification at the carotid bifurcation without hemodynamically significant stenosis. LEFT CAROTID SYSTEM: No dissection, occlusion or aneurysm. Mild atherosclerotic calcification at the carotid bifurcation without hemodynamically significant stenosis. VERTEBRAL ARTERIES: Codominant configuration. Both vertebral artery origins are normal. There is no dissection, occlusion or flow-limiting stenosis to the skull base (V1-V3 segments). CTA HEAD FINDINGS POSTERIOR CIRCULATION: --Vertebral arteries: Normal V4 segments. --Inferior cerebellar arteries: Normal. --Basilar artery: Normal. --Superior cerebellar arteries: Normal. --Posterior cerebral arteries (PCA): Normal. ANTERIOR CIRCULATION: --Intracranial internal carotid arteries: Normal. --Anterior cerebral arteries (ACA): Normal. Both A1 segments are  present. Patent anterior communicating artery (a-comm). --Middle cerebral arteries (MCA): Normal. VENOUS SINUSES: As permitted by contrast timing, patent. ANATOMIC VARIANTS: Fetal origin of the left posterior cerebral artery. Review of the MIP images confirms the above findings. IMPRESSION: 1. No emergent large vessel occlusion or hemodynamically significant stenosis of the head or neck. Aortic Atherosclerosis (ICD10-I70.0) and Emphysema (ICD10-J43.9). Electronically Signed   By: Ulyses Jarred M.D.   On: 02/16/2020 22:33   MR BRAIN  WO CONTRAST  Result Date: 02/16/2020 CLINICAL DATA:  Acute neuro deficit. Expressive aphasia left leg weakness EXAM: MRI HEAD WITHOUT CONTRAST TECHNIQUE: Multiplanar, multiecho pulse sequences of the brain and surrounding structures were obtained without intravenous contrast. COMPARISON:  CT head 02/16/2020 FINDINGS: Brain: Acute infarct in the left posterior insula and parietal operculum. This measures approximately 15 x 25 mm. No other areas of acute infarct. Negative for hemorrhage or mass. Scattered white matter hyperintensities compatible with chronic microvascular ischemia, moderate in degree. Negative for mass lesion. Vascular: Normal arterial flow voids Skull and upper cervical spine: No focal skeletal lesion. Sinuses/Orbits: Cyst left maxillary sinus. Mild mucosal edema in the paranasal sinuses. Bilateral cataract extraction. Other: None IMPRESSION: Acute infarct left posterior insula and parietal operculum without hemorrhage Moderate chronic microvascular ischemic change in the white matter. Electronically Signed   By: Franchot Gallo M.D.   On: 02/16/2020 18:20   ECHOCARDIOGRAM COMPLETE BUBBLE STUDY  Result Date: 02/17/2020    ECHOCARDIOGRAM REPORT   Patient Name:   TRAMAINE SAULS Vellucci Date of Exam: 02/17/2020 Medical Rec #:  [id number]             Height:       72.0 in Accession #:    [id number]            Weight:       216.0 lb Date of Birth:  Nov 14, 1940            BSA:           2.202 m Patient Age:    53 years              BP:           141/81 mmHg Patient Gender: M                     HR:           73 bpm. Exam Location:  Inpatient Procedure: 2D Echo, Cardiac Doppler, Color Doppler and Saline Contrast Bubble            Study Indications:    Stroke  History:        Patient has prior history of Echocardiogram examinations, most                 recent 04/18/2018. CAD, Arrythmias:Atrial Fibrillation; Risk                 Factors:Hypertension and Dyslipidemia. H/O DVT.  Sonographer:    Clayton Lefort RDCS (AE) Referring Phys: [id number] AMY N [facility] IMPRESSIONS  1. Left ventricular ejection fraction, by estimation, is 60 to 65%. The left ventricle has normal function. The left ventricle has no regional wall motion abnormalities. Left ventricular diastolic parameters are consistent with Grade I diastolic dysfunction (impaired relaxation).  2. Right ventricular systolic function is normal. The right ventricular size is normal. There is normal pulmonary artery systolic pressure.  3. Left atrial size was severely dilated.  4. The mitral valve is normal in structure. Trivial mitral valve regurgitation. No evidence of mitral stenosis.  5. The aortic valve is normal in structure. There is mild calcification of the aortic valve. There is mild thickening of the aortic valve. Aortic valve regurgitation is mild. No aortic stenosis is present.  6. The inferior vena cava is normal in size with greater than 50% respiratory variability, suggesting right atrial pressure of 3 mmHg.  7. Agitated saline contrast bubble study was negative, with no evidence of any interatrial  shunt. FINDINGS  Left Ventricle: Left ventricular ejection fraction, by estimation, is 60 to 65%. The left ventricle has normal function. The left ventricle has no regional wall motion abnormalities. The left ventricular internal cavity size was normal in size. There is  no left ventricular hypertrophy. Left ventricular diastolic parameters are  consistent with Grade I diastolic dysfunction (impaired relaxation). Right Ventricle: The right ventricular size is normal. No increase in right ventricular wall thickness. Right ventricular systolic function is normal. There is normal pulmonary artery systolic pressure. The tricuspid regurgitant velocity is 2.14 m/s, and  with an assumed right atrial pressure of 3 mmHg, the estimated right ventricular systolic pressure is 94.4 mmHg. Left Atrium: Left atrial size was severely dilated. Right Atrium: Right atrial size was normal in size. Pericardium: There is no evidence of pericardial effusion. Mitral Valve: The mitral valve is normal in structure. Mild mitral annular calcification. Trivial mitral valve regurgitation. No evidence of mitral valve stenosis. Tricuspid Valve: The tricuspid valve is normal in structure. Tricuspid valve regurgitation is mild . No evidence of tricuspid stenosis. Aortic Valve: The aortic valve is normal in structure. There is mild calcification of the aortic valve. There is mild thickening of the aortic valve. Aortic valve regurgitation is mild. No aortic stenosis is present. Aortic valve mean gradient measures 2.3 mmHg. Aortic valve peak gradient measures 3.7 mmHg. Aortic valve area, by VTI measures 2.60 cm. Pulmonic Valve: The pulmonic valve was normal in structure. Pulmonic valve regurgitation is not visualized. No evidence of pulmonic stenosis. Aorta: The aortic root is normal in size and structure. Venous: The inferior vena cava is normal in size with greater than 50% respiratory variability, suggesting right atrial pressure of 3 mmHg. IAS/Shunts: No atrial level shunt detected by color flow Doppler. Agitated saline contrast was given intravenously to evaluate for intracardiac shunting. Agitated saline contrast bubble study was negative, with no evidence of any interatrial shunt. There  is no evidence of a patent foramen ovale. There is no evidence of an atrial septal defect.  LEFT  VENTRICLE PLAX 2D LVIDd:         5.40 cm LVIDs:         3.60 cm LV PW:         1.24 cm LV IVS:        1.16 cm LVOT diam:     2.50 cm LV SV:         45 LV SV Index:   20 LVOT Area:     4.91 cm  RIGHT VENTRICLE             IVC RV Basal diam:  3.40 cm     IVC diam: 1.60 cm RV S prime:     14.90 cm/s TAPSE (M-mode): 2.3 cm LEFT ATRIUM              Index       RIGHT ATRIUM           Index LA diam:        4.90 cm  2.23 cm/m  RA Area:     18.20 cm LA Vol (A2C):   111.0 ml 50.42 ml/m RA Volume:   40.90 ml  18.58 ml/m LA Vol (A4C):   141.0 ml 64.04 ml/m LA Biplane Vol: 127.0 ml 57.68 ml/m  AORTIC VALVE AV Area (Vmax):    2.35 cm AV Area (Vmean):   2.10 cm AV Area (VTI):     2.60 cm AV Vmax:  96.00 cm/s AV Vmean:          69.267 cm/s AV VTI:            0.173 m AV Peak Grad:      3.7 mmHg AV Mean Grad:      2.3 mmHg LVOT Vmax:         46.00 cm/s LVOT Vmean:        29.700 cm/s LVOT VTI:          0.091 m LVOT/AV VTI ratio: 0.53  AORTA Ao Root diam: 3.50 cm Ao Asc diam:  3.40 cm TRICUSPID VALVE TR Peak grad:   18.3 mmHg TR Vmax:        214.00 cm/s  SHUNTS Systemic VTI:  0.09 m Systemic Diam: 2.50 cm Skeet Latch MD Electronically signed by Skeet Latch MD Signature Date/Time: 02/17/2020/11:58:10 AM    Final    VAS US AORTA/IVC/ILIACS  Result Date: 03/05/2020 ABDOMINAL AORTA STUDY Indications: Follow up exam for possible AAA noted on x-ray Limitations: Air/bowel gas.  Performing Technologist: Ronal Fear RVS, RCS  Examination Guidelines: A complete evaluation includes B-mode imaging, spectral Doppler, color Doppler, and power Doppler as needed of all accessible portions of each vessel. Bilateral testing is considered an integral part of a complete examination. Limited examinations for reoccurring indications may be performed as noted.  Abdominal Aorta Findings: +-----------+-------+----------+----------+--------+--------+------------------+ Location   AP (cm)Trans (cm)PSV  (cm/s)WaveformThrombusComments           +-----------+-------+----------+----------+--------+--------+------------------+ Proximal   2.31   2.33      75                                           +-----------+-------+----------+----------+--------+--------+------------------+ Mid        2.50   2.57      70                                           +-----------+-------+----------+----------+--------+--------+------------------+ Distal     2.54   2.43                        Present mild calcification +-----------+-------+----------+----------+--------+--------+------------------+ RT CIA Prox1.2    1.1                                                    +-----------+-------+----------+----------+--------+--------+------------------+  Summary: Abdominal Aorta: The largest aortic measurement is 2.5 cm, based on limited visualization due to overlying bowel gas.  *See table(s) above for measurements and observations.  Electronically signed by Ruta Hinds MD on 03/05/2020 at 1:11:36 PM.   Final   I, Lynne Leader, personally (independently) visualized and performed the interpretation of the xray images attached in this note.    Assessment and Plan: 80 y.o. male with lumbar pain.  Improved with physical therapy and home exercises.  Recommend more regular home exercise program.  Recheck back with me as needed.  Of note aneurysm was incidentally seen on x-ray evaluated with ultrasound.  PCP notified this issue will be followed up further with PCP in the future as needed.  Aneurysm small at 2.5 cm.  PDMP not reviewed this encounter. No orders of the defined types were placed in this encounter.  No orders of the defined types were placed in this encounter.    Discussed warning signs or symptoms. Please see discharge instructions. Patient expresses understanding.   The above documentation has been reviewed and is accurate and complete Lynne Leader, M.D.

## 2020-04-07 NOTE — Patient Instructions (Signed)
Access Code: [email or web address] URL: [email or web address] Date: 04/07/2020 Prepared by: Lyndee Hensen  Exercises Seated Hamstring Stretch - 2 x daily - 3 reps - 30 hold Supine Single Knee to Chest Stretch - 2 x daily - 3 reps - 30 hold Supine Piriformis Stretch Pulling Heel to Hip - 2 x daily - 3 reps - 30 hold Supine Lower Trunk Rotation - 2 x daily - 10 reps - 5 hold Supine Posterior Pelvic Tilt - 2 x daily - 1-2 sets - 10 reps Sidelying Hip Abduction - 1 x daily - 1-2 sets - 10 reps

## 2020-04-08 ENCOUNTER — Encounter: Payer: Self-pay | Admitting: Family Medicine

## 2020-04-08 ENCOUNTER — Ambulatory Visit: Payer: Medicare HMO | Admitting: Family Medicine

## 2020-04-08 VITALS — BP 118/82 | HR 78 | Ht 72.0 in | Wt 220.0 lb

## 2020-04-08 DIAGNOSIS — I714 Abdominal aortic aneurysm, without rupture, unspecified: Secondary | ICD-10-CM

## 2020-04-08 DIAGNOSIS — M545 Low back pain, unspecified: Secondary | ICD-10-CM | POA: Diagnosis not present

## 2020-04-08 NOTE — Patient Instructions (Signed)
Thank you for coming in today.  If that back bothers you again we can always do a little more physical therapy. Let me know.  The exercises will help too.   Recheck with me as needed.

## 2020-04-09 ENCOUNTER — Encounter: Payer: Medicare HMO | Admitting: Physical Therapy

## 2020-04-15 ENCOUNTER — Other Ambulatory Visit: Payer: Self-pay | Admitting: Family Medicine

## 2020-04-21 ENCOUNTER — Telehealth: Payer: Self-pay | Admitting: Family Medicine

## 2020-04-21 NOTE — Telephone Encounter (Signed)
Left message for patient to call back and schedule Medicare Annual Wellness Visit (AWV) either virtually OR in office.   Last AWV 04/12/19; please schedule at anytime with [facility] Health Advisor at [facility].  This should be a 45 minute visit.

## 2020-05-12 ENCOUNTER — Encounter: Payer: Self-pay | Admitting: Family Medicine

## 2020-05-12 ENCOUNTER — Ambulatory Visit (INDEPENDENT_AMBULATORY_CARE_PROVIDER_SITE_OTHER): Payer: Medicare HMO | Admitting: Family Medicine

## 2020-05-12 ENCOUNTER — Ambulatory Visit (INDEPENDENT_AMBULATORY_CARE_PROVIDER_SITE_OTHER): Payer: Medicare HMO

## 2020-05-12 ENCOUNTER — Other Ambulatory Visit: Payer: Self-pay

## 2020-05-12 VITALS — BP 130/83 | HR 65 | Temp 97.3°F | Ht 72.0 in | Wt 217.0 lb

## 2020-05-12 DIAGNOSIS — S62664A Nondisplaced fracture of distal phalanx of right ring finger, initial encounter for closed fracture: Secondary | ICD-10-CM

## 2020-05-12 DIAGNOSIS — I10 Essential (primary) hypertension: Secondary | ICD-10-CM | POA: Diagnosis not present

## 2020-05-12 DIAGNOSIS — I4819 Other persistent atrial fibrillation: Secondary | ICD-10-CM

## 2020-05-12 DIAGNOSIS — M79644 Pain in right finger(s): Secondary | ICD-10-CM

## 2020-05-12 DIAGNOSIS — M7989 Other specified soft tissue disorders: Secondary | ICD-10-CM | POA: Diagnosis not present

## 2020-05-12 DIAGNOSIS — M810 Age-related osteoporosis without current pathological fracture: Secondary | ICD-10-CM | POA: Diagnosis not present

## 2020-05-12 DIAGNOSIS — T1490XA Injury, unspecified, initial encounter: Secondary | ICD-10-CM

## 2020-05-12 IMAGING — DX DG FINGER RING 2+V*R*
3 series · 3 of 3 positions shown · non-contrast
Comparison: None.

CLINICAL DATA: Finger injury.  Piece of MARCO ZARATTINI fell on finger.

EXAM:
RIGHT RING FINGER 2+V

[finger pa]
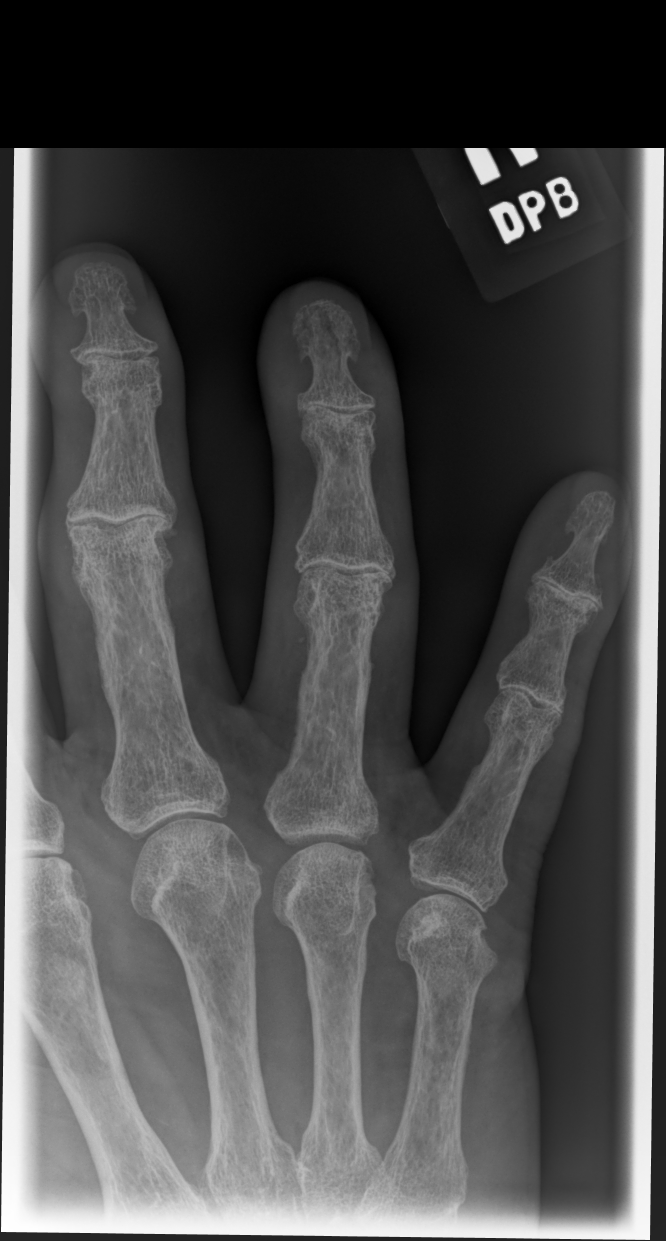

[finger oblique]
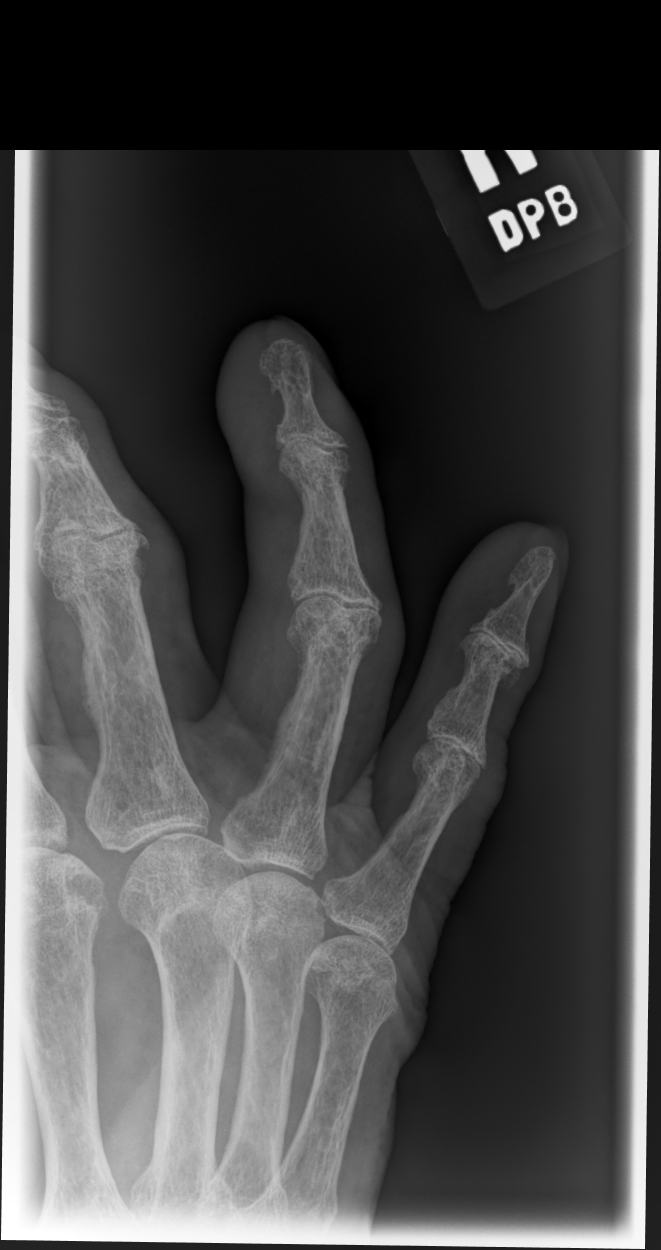

[finger lat]
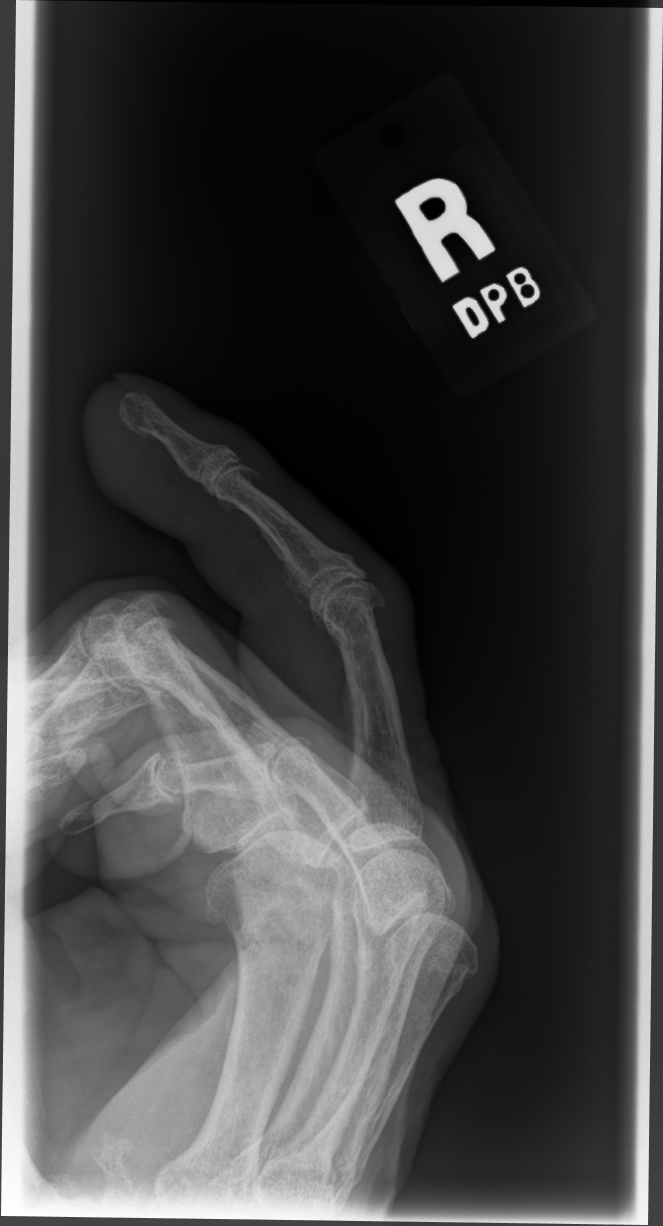

[3 of 3 positions shown; findings below may reference images not displayed]

FINDINGS: The bones are demineralized. There is a nondisplaced fracture of the
distal 4th phalanx, best seen on the PA view. This demonstrates no
definite intra-articular extension. Underlying moderate
interphalangeal degenerative changes are noted. There is soft tissue
swelling in the distal ring finger without evidence of foreign body
or soft tissue emphysema.
IMPRESSION: Nondisplaced extra-articular fracture of the distal 4th phalanx.
Underlying interphalangeal osteoarthritis.

## 2020-05-12 NOTE — Assessment & Plan Note (Signed)
At goal on amlodipine 5 mg daily, atenolol 100 mg daily, and lisinopril 40 mg daily.

## 2020-05-12 NOTE — Patient Instructions (Addendum)
It was very nice to see you today!  I think you fractured your finger.  Please leave the splint in place as much as you can.  It will take several weeks for this to heal up but I would like for you to see orthopedics to make sure it is healing appropriately.  They should call you within the next couple of days to have this scheduled.  Take care, Dr [name]  PLEASE NOTE:  If you had any lab tests please let us know if you have not heard back within a few days. You may see your results on mychart before we have a chance to review them but we will give you a call once they are reviewed by [place]. If we ordered any referrals today, please let us know if you have not heard from their office within the next week.   Please try these tips to maintain a healthy lifestyle:   Eat at least 3 REAL meals and 1-2 snacks per day.  Aim for no more than 5 hours between eating.  If you eat breakfast, please do so within one hour of getting up.    Each meal should contain half fruits/vegetables, one quarter protein, and one quarter carbs (no bigger than a computer mouse)   Cut down on sweet beverages. This includes juice, soda, and sweet tea.     Drink at least 1 glass of water with each meal and aim for at least 8 glasses per day   Exercise at least 150 minutes every week.

## 2020-05-12 NOTE — Progress Notes (Signed)
   William Norman is a 80 y.o. male who presents today for an office visit.  Assessment/Plan:  New/Acute Problems: Right Finger Pain Plain film with what appears to be distal phalanx fracture based on my read.  Will await radiology read.  He is neurovascularly intact and has no significant nailbed injury-do not think he needs urgent surgical evaluation at this point however will place referral to orthopedics for further management.  Patient was placed in a splint today and advised to stay in the splint as much as possible until he follows up with orthopedics.  He is okay with using over-the-counter meds as needed for pain control.  Chronic Problems Addressed Today: Essential hypertension At goal on amlodipine 5 mg daily, atenolol 100 mg daily, and lisinopril 40 mg daily.  Persistent atrial fibrillation Rate controlled.  He is anticoagulated on Eliquis which most likely worsen his hematoma due to above injury.  Does not have any signs of continued bleeding or worsening subungual hematoma.  Will continue Eliquis for now.     Subjective:  HPI:  Patient injured right fourth digit yesterday.  Was doing work and smashed it between 2 blocks of wood.  Had immediate pain ambulating the area.  Bleeding has subsided but pain and swelling have persisted.  Has tried taking over-the-counter meds with some improvement.       Objective:  Physical Exam: BP 130/83   Pulse 65   Temp (!) 97.3 F (36.3 C) (Temporal)   Ht 6' (1.829 m)   Wt 217 lb (98.4 kg)   SpO2 98%   BMI 29.43 kg/m   Gen: No acute distress, resting comfortably MSK: - Right Hand: Distal aspect of right fourth digit with hematoma and edema.  Good cap refill distally and sensation intact.  Slight subungual hematoma noted.  Distal phalanx tender to palpation. Neuro: Grossly normal, moves all extremities Psych: Normal affect and thought content      Lourdez Mcgahan M. [name], MD 05/12/2020 3:05 PM

## 2020-05-12 NOTE — Assessment & Plan Note (Signed)
Rate controlled.  He is anticoagulated on Eliquis which most likely worsen his hematoma due to above injury.  Does not have any signs of continued bleeding or worsening subungual hematoma.  Will continue Eliquis for now.

## 2020-05-13 ENCOUNTER — Encounter: Payer: Self-pay | Admitting: Orthopaedic Surgery

## 2020-05-13 ENCOUNTER — Ambulatory Visit: Payer: Medicare HMO | Admitting: Orthopaedic Surgery

## 2020-05-13 DIAGNOSIS — S62639A Displaced fracture of distal phalanx of unspecified finger, initial encounter for closed fracture: Secondary | ICD-10-CM

## 2020-05-13 NOTE — Progress Notes (Signed)
Cardiology Office Note:    Date:  05/14/2020   ID:  William Norman, DOB 08/12/1940, MRN [id number]  PCP:  Vivi Barrack, MD  Cardiologist:  Shirlee More, MD    Referring MD: Vivi Barrack, MD    ASSESSMENT:    1. Long term current use of anticoagulant therapy   2. Persistent atrial fibrillation ([name])   3. Hypertensive heart disease without heart failure    PLAN:    In order of problems listed above:  1. Stable continue his anticoagulant.  If we ran into issues in the future frequent epistasis GI GU bleeding can be a good candidate for watchman device but at this time I would not recommend and again told her if anyone wants to interrupt his anticoagulants to contact me 2. Stable rate is controlled continue his beta-blocker 3. BP at target continue treatment including ACE inhibitor calcium channel blocker beta-blocker   Next appointment: 6 months   Medication Adjustments/Labs and Tests Ordered: Current medicines are reviewed at length with the patient today.  Concerns regarding medicines are outlined above.  No orders of the defined types were placed in this encounter.  No orders of the defined types were placed in this encounter.   Chief Complaint  Patient presents with  . Follow-up  . Anticoagulation    History of Present Illness:    William Norman is a 80 y.o. male with a hx of persistent atrial fibrillation with anticoagulation hypertensive heart disease without heart failure hyperlipidemia and CAD with remote PCI of the LAD in 1992 and a history of deep vein thrombosis on chronic anticoagulation last seen 02/21/2020.  He had acute embolic stroke 76/28/3151 in the setting of anticoagulant withdrawal in preparation for TURP.  Compliance with diet, lifestyle and medications: Yes  He had trauma ring finger left hand fracture the tip and has hematoma under the nail.  He did not interrupt anticoagulation. He is recovered well from the stroke but still  searches for words at times. No bleeding complication of his anticoagulant No chest pain palpitations syncope shortness of breath. Past Medical History:  Diagnosis Date  . BPH (benign prostatic hypertrophy)   . BPH associated with nocturia 08/29/2013  . BPH with obstruction/lower urinary tract symptoms 08/14/2019  . CAD (coronary artery disease)   . CAD (coronary artery disease), native coronary artery    PTCA of RCA 1986, PTCA of LAD 1992,  Negative treadmill Cardiolite 2011   . Chronic dermatitis of hands 10/12/2015  . Chronic venous insufficiency   . Coagulation disorder ([place]) 10/26/2018  . DVT (deep venous thrombosis) ([place])   . Dyslipidemia   . Eczema   . Erectile dysfunction 07/11/2012  . Essential hypertension   . GERD 08/04/2006      . GERD (gastroesophageal reflux disease)   . Hyperglycemia 07/08/2019  . Hyperlipidemia   . Hypertension   . Incomplete emptying of bladder 08/14/2019  . Long term current use of anticoagulant therapy   . Lumbar disc disease   . Overweight (BMI 25.0-29.9) 06/18/2015  . Persistent atrial fibrillation ([name]) 04/10/2018  . Personal history of DVT (deep vein thrombosis)    Initially in 1998 with recurrence in 2002 now on chronic warfarin   . Soft tissue lesion of foot 08/27/2015  . Urinary tract infection with hematuria 08/14/2019    Past Surgical History:  Procedure Laterality Date  . CARDIAC CATHETERIZATION    . CARPAL TUNNEL RELEASE  04/21/2011   Procedure: CARPAL TUNNEL RELEASE;  Surgeon:  Tennis Must, MD;  Location: [facility];  Service: Orthopedics;  Laterality: Left;  . CARPAL TUNNEL RELEASE  05/23/2011   Procedure: CARPAL TUNNEL RELEASE;  Surgeon: Tennis Must, MD;  Location: [facility];  Service: Orthopedics;  Laterality: Right;  . COLON SURGERY  2010 and 2011 colostomy reversal  . CORONARY ANGIOPLASTY    . EYE SURGERY     repair of macular hole  . NASAL SEPTUM SURGERY    . precutaneous transluminal coronary  angioplasty    . right hernia    . TONSILLECTOMY AND ADENOIDECTOMY      Current Medications: Current Meds  Medication Sig  . acetaminophen (TYLENOL) 650 MG CR tablet Take 650 mg by mouth as needed for pain.  Marland Kitchen amLODipine (NORVASC) 5 MG tablet TAKE 1 TABLET BY MOUTH EVERY OTHER DAY  . apixaban (ELIQUIS) 5 MG TABS tablet Take 1 tablet (5 mg total) by mouth 2 (two) times daily.  Marland Kitchen atenolol (TENORMIN) 100 MG tablet TAKE 1 TABLET BY MOUTH EVERY DAY  . atorvastatin (LIPITOR) 40 MG tablet Take 0.5 tablets (20 mg total) by mouth daily.  . Coenzyme Q10 (COQ10) 200 MG CAPS Take 1 capsule by mouth daily.   . Cyanocobalamin (VITAMIN B-12) 1000 MCG SUBL Place 1 tablet under the tongue daily.  . hydroxypropyl methylcellulose / hypromellose (ISOPTO TEARS / GONIOVISC) 2.5 % ophthalmic solution Place 1 drop into both eyes 4 (four) times daily as needed for dry eyes.  Marland Kitchen lisinopril (ZESTRIL) 40 MG tablet TAKE 1 TABLET BY MOUTH EVERY DAY  . nitroGLYCERIN (NITROSTAT) 0.4 MG SL tablet Dissolve 1 tablet under the tongue every 5 minutes as needed  . tamsulosin (FLOMAX) 0.4 MG CAPS capsule Take 1 capsule (0.4 mg total) by mouth in the morning and at bedtime.     Allergies:   Other and Simvastatin   Social History   Socioeconomic History  . Marital status: Married    Spouse name: Not on file  . Number of children: 3  . Years of education: 63  . Highest education level: Not on file  Occupational History  . Occupation: Retired  Tobacco Use  . Smoking status: Former Smoker    Packs/day: 1.00    Years: 60.00    Pack years: 60.00    Types: Cigarettes    Quit date: 03/25/1998    Years since quitting: 22.1  . Smokeless tobacco: Never Used  Vaping Use  . Vaping Use: Never used  Substance and Sexual Activity  . Alcohol use: Yes    Alcohol/week: 2.0 - 3.0 standard drinks    Types: 2 - 3 Glasses of wine per week    Comment: 1 glass of red wine every 2-3 night   . Drug use: No  . Sexual activity: Not on  file  Other Topics Concern  . Not on file  Social History Narrative   Fun: Working outside in yard, cutting wood    Denies abuse and feels safe at home.    Right handed   Lives at home with wife, daughter, granddaughter, and son (periodically)   Social Determinants of Health   Financial Resource Strain: Not on file  Food Insecurity: Not on file  Transportation Needs: Not on file  Physical Activity: Not on file  Stress: Not on file  Social Connections: Not on file     Family History: The patient's family history includes AAA (abdominal aortic aneurysm) in his brother; Atrial fibrillation in his father and mother;  Heart attack in his father; Heart disease in his father and mother; Stroke in his mother. ROS:   Please see the history of present illness.    All other systems reviewed and are negative.  EKGs/Labs/Other Studies Reviewed:    The following studies were reviewed today:   Recent Labs: 09/30/2019: TSH 2.86 02/16/2020: ALT 23; BUN 18; Creatinine, Ser 0.70; Potassium 4.4; Sodium 140 02/17/2020: Hemoglobin 16.1; Platelets 185  Recent Lipid Panel    Component Value Date/Time   CHOL 143 02/17/2020 0401   CHOL 130 12/03/2019 1509   TRIG 45 02/17/2020 0401   TRIG 68 01/10/2006 1108   HDL 48 02/17/2020 0401   HDL 48 12/03/2019 1509   CHOLHDL 3.0 02/17/2020 0401   VLDL 9 02/17/2020 0401   LDLCALC 86 02/17/2020 0401   LDLCALC 66 12/03/2019 1509   LDLDIRECT 131.5 04/22/2010 1018    Physical Exam:    VS:  BP 130/72   Pulse 68   Ht 6' (1.829 m)   Wt 217 lb 1.3 oz (98.5 kg)   SpO2 96%   BMI 29.44 kg/m     Wt Readings from Last 3 Encounters:  05/14/20 217 lb 1.3 oz (98.5 kg)  05/12/20 217 lb (98.4 kg)  04/08/20 220 lb (99.8 kg)     GEN:  Well nourished, well developed in no acute distress HEENT: Normal NECK: No JVD; No carotid bruits LYMPHATICS: No lymphadenopathy CARDIAC: S1 is variable rhythm is irregular  no murmurs, rubs, gallops RESPIRATORY:  Clear to  auscultation without rales, wheezing or rhonchi  ABDOMEN: Soft, non-tender, non-distended MUSCULOSKELETAL:  No edema; No deformity  SKIN: Warm and dry NEUROLOGIC:  Alert and oriented x 3 PSYCHIATRIC:  Normal affect    Signed, Shirlee More, MD  05/14/2020 1:23 PM     [facility]

## 2020-05-13 NOTE — Progress Notes (Signed)
Office Visit Note   Patient: William Norman           Date of Birth: June 08, 1940           MRN: [id number] Visit Date: 05/13/2020              Requested by: Vivi Barrack, MD [address] [place],  [place] [address] PCP: Vivi Barrack, MD   Assessment & Plan: Visit Diagnoses:  1. Closed fracture of tuft of distal phalanx of finger     Plan: We placed his right ring finger in a mallet finger splint to protect the tuft fracture.  He is given extra Covan so he can rewrap it.  We will see him back in 4 weeks for repeat x-rays.  Sooner if there is any questions concerns.  In regards to the subungual hematoma if this becomes tight he can always come in to have this relieved.  Blisters on the volar aspect of the finger to be left in place should dissipate on their own.  Questions were encouraged and answered by Dr. Ninfa Linden myself.  Follow-Up Instructions: Return in about 4 weeks (around 06/10/2020) for Radiographs.   Orders:  No orders of the defined types were placed in this encounter.  No orders of the defined types were placed in this encounter.     Procedures: No procedures performed   Clinical Data: No additional findings.   Subjective: Chief Complaint  Patient presents with  . Right Ring Finger - Injury    HPI William Norman 80 year old male who was cutting wood yesterday when a large piece of wood fell on his right ring finger.  He is seen by his primary care physician radiographs were obtained.  I reviewed the radiographs and is shows a right ring finger tuft fracture nondisplaced.  He is placed in a splint which he feels in the external way.  He states overall the finger is feeling better.  No other injuries. Review of Systems See HPI.  Objective: Vital Signs: There were no vitals taken for this visit.  Physical Exam General: Well-developed well-nourished male in no acute distress mood affect appropriate Ortho Exam Right ring finger he has a subungual  hematoma small.  He has hematomas involving the volar aspect distal phalanx ring finger.  He is able to bring the finger out straight and able to flex it with significant amount of pain.  Remainder the hand is nontender.  There is no rashes healing ulcerations throughout the hand.  Sensation grossly intact throughout the hand.  Radial pulses intact.  Specialty Comments:  No specialty comments available.  Imaging: No results found.   PMFS History: Patient Active Problem List   Diagnosis Date Noted  . AAA (abdominal aortic aneurysm) ([place]) 03/06/2020  . Porokeratosis 02/25/2020  . Stroke ([place]) 02/16/2020  . Hypertension   . Hyperlipidemia   . GERD (gastroesophageal reflux disease)   . Eczema   . DVT (deep venous thrombosis) ([place])   . CAD (coronary artery disease)   . BPH with obstruction/lower urinary tract symptoms 08/14/2019  . Incomplete emptying of bladder 08/14/2019  . Urinary tract infection with hematuria 08/14/2019  . Hyperglycemia 07/08/2019  . Coagulation disorder ([place]) 10/26/2018  . Persistent atrial fibrillation ([place]) 04/10/2018  . Chronic dermatitis of hands 10/12/2015  . Soft tissue lesion of foot 08/27/2015  . Overweight (BMI 25.0-29.9) 06/18/2015  . BPH associated with nocturia 08/29/2013  . CAD (coronary artery disease), native coronary artery   . Long term  current use of anticoagulant therapy   . Personal history of DVT (deep vein thrombosis)   . Chronic venous insufficiency   . Erectile dysfunction 07/11/2012  . Lumbar disc disease   . GERD 08/04/2006  . Dyslipidemia   . Essential hypertension    Past Medical History:  Diagnosis Date  . BPH (benign prostatic hypertrophy)   . BPH associated with nocturia 08/29/2013  . BPH with obstruction/lower urinary tract symptoms 08/14/2019  . CAD (coronary artery disease)   . CAD (coronary artery disease), native coronary artery    PTCA of RCA 1986, PTCA of LAD 1992,  Negative treadmill Cardiolite 2011   . Chronic  dermatitis of hands 10/12/2015  . Chronic venous insufficiency   . Coagulation disorder ([place]) 10/26/2018  . DVT (deep venous thrombosis) ([place])   . Dyslipidemia   . Eczema   . Erectile dysfunction 07/11/2012  . Essential hypertension   . GERD 08/04/2006      . GERD (gastroesophageal reflux disease)   . Hyperglycemia 07/08/2019  . Hyperlipidemia   . Hypertension   . Incomplete emptying of bladder 08/14/2019  . Long term current use of anticoagulant therapy   . Lumbar disc disease   . Overweight (BMI 25.0-29.9) 06/18/2015  . Persistent atrial fibrillation ([place]) 04/10/2018  . Personal history of DVT (deep vein thrombosis)    Initially in 1998 with recurrence in 2002 now on chronic warfarin   . Soft tissue lesion of foot 08/27/2015  . Urinary tract infection with hematuria 08/14/2019    Family History  Problem Relation Age of Onset  . Heart disease Father   . Heart attack Father   . Atrial fibrillation Father   . Heart disease Mother   . Atrial fibrillation Mother   . Stroke Mother   . AAA (abdominal aortic aneurysm) Brother     Past Surgical History:  Procedure Laterality Date  . CARDIAC CATHETERIZATION    . CARPAL TUNNEL RELEASE  04/21/2011   Procedure: CARPAL TUNNEL RELEASE;  Surgeon: Tennis Must, MD;  Location: [facility];  Service: Orthopedics;  Laterality: Left;  . CARPAL TUNNEL RELEASE  05/23/2011   Procedure: CARPAL TUNNEL RELEASE;  Surgeon: Tennis Must, MD;  Location: [facility];  Service: Orthopedics;  Laterality: Right;  . COLON SURGERY  2010 and 2011 colostomy reversal  . CORONARY ANGIOPLASTY    . EYE SURGERY     repair of macular hole  . NASAL SEPTUM SURGERY    . precutaneous transluminal coronary angioplasty    . right hernia    . TONSILLECTOMY AND ADENOIDECTOMY     Social History   Occupational History  . Occupation: Retired  Tobacco Use  . Smoking status: Former Smoker    Packs/day: 1.00    Years: 60.00    Pack years: 60.00     Types: Cigarettes    Quit date: 03/25/1998    Years since quitting: 22.1  . Smokeless tobacco: Never Used  Vaping Use  . Vaping Use: Never used  Substance and Sexual Activity  . Alcohol use: Yes    Alcohol/week: 2.0 - 3.0 standard drinks    Types: 2 - 3 Glasses of wine per week    Comment: 1 glass of red wine every 2-3 night   . Drug use: No  . Sexual activity: Not on file

## 2020-05-14 ENCOUNTER — Other Ambulatory Visit: Payer: Self-pay

## 2020-05-14 ENCOUNTER — Ambulatory Visit: Payer: Medicare HMO | Admitting: Cardiology

## 2020-05-14 ENCOUNTER — Encounter: Payer: Self-pay | Admitting: Cardiology

## 2020-05-14 VITALS — BP 130/72 | HR 68 | Ht 72.0 in | Wt 217.1 lb

## 2020-05-14 DIAGNOSIS — I119 Hypertensive heart disease without heart failure: Secondary | ICD-10-CM

## 2020-05-14 DIAGNOSIS — I4819 Other persistent atrial fibrillation: Secondary | ICD-10-CM | POA: Diagnosis not present

## 2020-05-14 DIAGNOSIS — Z7901 Long term (current) use of anticoagulants: Secondary | ICD-10-CM | POA: Diagnosis not present

## 2020-05-14 NOTE — Patient Instructions (Signed)

## 2020-05-18 ENCOUNTER — Ambulatory Visit: Payer: Medicare HMO | Admitting: Physician Assistant

## 2020-05-19 ENCOUNTER — Encounter: Payer: Self-pay | Admitting: Podiatry

## 2020-05-19 ENCOUNTER — Ambulatory Visit: Payer: Medicare HMO | Admitting: Podiatry

## 2020-05-19 ENCOUNTER — Other Ambulatory Visit: Payer: Self-pay

## 2020-05-19 DIAGNOSIS — D689 Coagulation defect, unspecified: Secondary | ICD-10-CM | POA: Diagnosis not present

## 2020-05-19 DIAGNOSIS — M79676 Pain in unspecified toe(s): Secondary | ICD-10-CM | POA: Diagnosis not present

## 2020-05-19 DIAGNOSIS — B351 Tinea unguium: Secondary | ICD-10-CM | POA: Diagnosis not present

## 2020-05-19 DIAGNOSIS — I872 Venous insufficiency (chronic) (peripheral): Secondary | ICD-10-CM | POA: Diagnosis not present

## 2020-05-19 NOTE — Progress Notes (Signed)
This patient returns to my office for at risk foot care.  This patient requires this care by a professional since this patient will be at risk due to having venous insufficiency and coagulation defect.  Patient is taking coumadin.  This patient is unable to cut nails himself since the patient cannot reach his nails.These nails are painful walking and wearing shoes.  This patient presents for at risk foot care today.  General Appearance  Alert, conversant and in no acute stress.  Vascular  Dorsalis pedis and posterior tibial  pulses are palpable  bilaterally.  Capillary return is within normal limits  bilaterally. Temperature is within normal limits  bilaterally.  Neurologic  Senn-Weinstein monofilament wire test within normal limits  bilaterally. Muscle power within normal limits bilaterally.  Nails Thick disfigured discolored nails with subungual debris  from hallux to fifth toes bilaterally. No evidence of bacterial infection or drainage bilaterally.   Orthopedic  No limitations of motion  feet .  No crepitus or effusions noted.  No bony pathology or digital deformities noted.  Skin  normotropic skin  noted bilaterally.  No signs of infections or ulcers noted.     Onychomycosis  Pain in right toes  Pain in left toes  Consent was obtained for treatment procedures.   Mechanical debridement of nails 1-5  bilaterally performed with a nail nipper.  Filed with dremel without incident.    Return office visit   10 weeks                  Told patient to return for periodic foot care and evaluation due to potential at risk complications.   Gardiner Barefoot DPM

## 2020-06-02 ENCOUNTER — Other Ambulatory Visit: Payer: Self-pay | Admitting: Family Medicine

## 2020-07-03 ENCOUNTER — Other Ambulatory Visit: Payer: Self-pay | Admitting: Family Medicine

## 2020-07-09 ENCOUNTER — Encounter: Payer: Medicare HMO | Admitting: Family Medicine

## 2020-07-12 ENCOUNTER — Other Ambulatory Visit: Payer: Self-pay | Admitting: Family Medicine

## 2020-07-20 ENCOUNTER — Ambulatory Visit (INDEPENDENT_AMBULATORY_CARE_PROVIDER_SITE_OTHER): Payer: Medicare HMO | Admitting: Family Medicine

## 2020-07-20 ENCOUNTER — Encounter: Payer: Self-pay | Admitting: Family Medicine

## 2020-07-20 ENCOUNTER — Other Ambulatory Visit: Payer: Self-pay

## 2020-07-20 VITALS — BP 107/63 | HR 68 | Temp 97.6°F | Ht 72.0 in | Wt 215.0 lb

## 2020-07-20 DIAGNOSIS — R739 Hyperglycemia, unspecified: Secondary | ICD-10-CM

## 2020-07-20 DIAGNOSIS — I714 Abdominal aortic aneurysm, without rupture, unspecified: Secondary | ICD-10-CM

## 2020-07-20 DIAGNOSIS — I25118 Atherosclerotic heart disease of native coronary artery with other forms of angina pectoris: Secondary | ICD-10-CM

## 2020-07-20 DIAGNOSIS — N401 Enlarged prostate with lower urinary tract symptoms: Secondary | ICD-10-CM | POA: Diagnosis not present

## 2020-07-20 DIAGNOSIS — I4819 Other persistent atrial fibrillation: Secondary | ICD-10-CM | POA: Diagnosis not present

## 2020-07-20 DIAGNOSIS — M5137 Other intervertebral disc degeneration, lumbosacral region: Secondary | ICD-10-CM | POA: Diagnosis not present

## 2020-07-20 DIAGNOSIS — Z0000 Encounter for general adult medical examination without abnormal findings: Secondary | ICD-10-CM

## 2020-07-20 DIAGNOSIS — E538 Deficiency of other specified B group vitamins: Secondary | ICD-10-CM | POA: Diagnosis not present

## 2020-07-20 DIAGNOSIS — M51379 Other intervertebral disc degeneration, lumbosacral region without mention of lumbar back pain or lower extremity pain: Secondary | ICD-10-CM

## 2020-07-20 DIAGNOSIS — R351 Nocturia: Secondary | ICD-10-CM

## 2020-07-20 DIAGNOSIS — I10 Essential (primary) hypertension: Secondary | ICD-10-CM | POA: Diagnosis not present

## 2020-07-20 DIAGNOSIS — I824Y9 Acute embolism and thrombosis of unspecified deep veins of unspecified proximal lower extremity: Secondary | ICD-10-CM

## 2020-07-20 DIAGNOSIS — I6300 Cerebral infarction due to thrombosis of unspecified precerebral artery: Secondary | ICD-10-CM

## 2020-07-20 DIAGNOSIS — E785 Hyperlipidemia, unspecified: Secondary | ICD-10-CM

## 2020-07-20 DIAGNOSIS — Z0001 Encounter for general adult medical examination with abnormal findings: Secondary | ICD-10-CM | POA: Diagnosis not present

## 2020-07-20 NOTE — Assessment & Plan Note (Signed)
Follows with urology

## 2020-07-20 NOTE — Assessment & Plan Note (Signed)
At goal today.  Continue amlodipine 5 mg daily, atenolol 100 mg daily, and lisinopril 40 mg daily.

## 2020-07-20 NOTE — Patient Instructions (Signed)
It was very nice to see you today!  We will check blood work.  No medication changes today.  Please follow-up with sports medicine for your back if needed.  I will see back in year.  Come back to see me sooner if needed.  Take care, Dr [name]  PLEASE NOTE:  If you had any lab tests please let us know if you have not heard back within a few days. You may see your results on mychart before we have a chance to review them but we will give you a call once they are reviewed by [place]. If we ordered any referrals today, please let us know if you have not heard from their office within the next week.   Please try these tips to maintain a healthy lifestyle:   Eat at least 3 REAL meals and 1-2 snacks per day.  Aim for no more than 5 hours between eating.  If you eat breakfast, please do so within one hour of getting up.    Each meal should contain half fruits/vegetables, one quarter protein, and one quarter carbs (no bigger than a computer mouse)   Cut down on sweet beverages. This includes juice, soda, and sweet tea.     Drink at least 1 glass of water with each meal and aim for at least 8 glasses per day   Exercise at least 150 minutes every week.    Preventive Care 39 Years and Older, Male Preventive care refers to lifestyle choices and visits with your health care provider that can promote health and wellness. This includes:  A yearly physical exam. This is also called an annual wellness visit.  Regular dental and eye exams.  Immunizations.  Screening for certain conditions.  Healthy lifestyle choices, such as: ? Eating a healthy diet. ? Getting regular exercise. ? Not using drugs or products that contain nicotine and tobacco. ? Limiting alcohol use. What can I expect for my preventive care visit? Physical exam Your health care provider will check your:  Height and weight. These may be used to calculate your BMI (body mass index). BMI is a measurement that tells if you  are at a healthy weight.  Heart rate and blood pressure.  Body temperature.  Skin for abnormal spots. Counseling Your health care provider may ask you questions about your:  Past medical problems.  Family's medical history.  Alcohol, tobacco, and drug use.  Emotional well-being.  Home life and relationship well-being.  Sexual activity.  Diet, exercise, and sleep habits.  History of falls.  Memory and ability to understand (cognition).  Work and work Statistician.  Access to firearms. What immunizations do I need? Vaccines are usually given at various ages, according to a schedule. Your health care provider will recommend vaccines for you based on your age, medical history, and lifestyle or other factors, such as travel or where you work.   What tests do I need? Blood tests  Lipid and cholesterol levels. These may be checked every 5 years, or more often depending on your overall health.  Hepatitis C test.  Hepatitis B test. Screening  Lung cancer screening. You may have this screening every year starting at age 65 if you have a 30-pack-year history of smoking and currently smoke or have quit within the past 15 years.  Colorectal cancer screening. ? All adults should have this screening starting at age 23 and continuing until age 76. ? Your health care provider may recommend screening at age 42 if you  are at increased risk. ? You will have tests every 1-10 years, depending on your results and the type of screening test.  Prostate cancer screening. Recommendations will vary depending on your family history and other risks.  Genital exam to check for testicular cancer or hernias.  Diabetes screening. ? This is done by checking your blood sugar (glucose) after you have not eaten for a while (fasting). ? You may have this done every 1-3 years.  Abdominal aortic aneurysm (AAA) screening. You may need this if you are a current or former smoker.  STD (sexually  transmitted disease) testing, if you are at risk. Follow these instructions at home: Eating and drinking  Eat a diet that includes fresh fruits and vegetables, whole grains, lean protein, and low-fat dairy products. Limit your intake of foods with high amounts of sugar, saturated fats, and salt.  Take vitamin and mineral supplements as recommended by your health care provider.  Do not drink alcohol if your health care provider tells you not to drink.  If you drink alcohol: ? Limit how much you have to 0-2 drinks a day. ? Be aware of how much alcohol is in your drink. In the [place] one drink equals one 12 oz bottle of beer (355 mL), one 5 oz glass of wine (148 mL), or one 1 oz glass of hard liquor (44 mL).   Lifestyle  Take daily care of your teeth and gums. Brush your teeth every morning and night with fluoride toothpaste. Floss one time each day.  Stay active. Exercise for at least 30 minutes 5 or more days each week.  Do not use any products that contain nicotine or tobacco, such as cigarettes, e-cigarettes, and chewing tobacco. If you need help quitting, ask your health care provider.  Do not use drugs.  If you are sexually active, practice safe sex. Use a condom or other form of protection to prevent STIs (sexually transmitted infections).  Talk with your health care provider about taking a low-dose aspirin or statin.  Find healthy ways to cope with stress, such as: ? Meditation, yoga, or listening to music. ? Journaling. ? Talking to a trusted person. ? Spending time with friends and family. Safety  Always wear your seat belt while driving or riding in a vehicle.  Do not drive: ? If you have been drinking alcohol. Do not ride with someone who has been drinking. ? When you are tired or distracted. ? While texting.  Wear a helmet and other protective equipment during sports activities.  If you have firearms in your house, make sure you follow all gun safety  procedures. What's next?  Visit your health care provider once a year for an annual wellness visit.  Ask your health care provider how often you should have your eyes and teeth checked.  Stay up to date on all vaccines. This information is not intended to replace advice given to you by your health care provider. Make sure you discuss any questions you have with your health care provider. Document Revised: 10/30/2018 Document Reviewed: 01/25/2018 Elsevier Patient Education  2021 [employer].

## 2020-07-20 NOTE — Assessment & Plan Note (Signed)
Check A1c. 

## 2020-07-20 NOTE — Assessment & Plan Note (Signed)
On Lipitor 10 mg daily.  Check labs today including lipid panel.

## 2020-07-20 NOTE — Assessment & Plan Note (Signed)
On Eliquis and statin. 

## 2020-07-20 NOTE — Assessment & Plan Note (Signed)
Will need repeat ultrasound early 2023.

## 2020-07-20 NOTE — Progress Notes (Signed)
Chief Complaint:  William Norman is a 80 y.o. male who presents today for his annual comprehensive physical exam and subsequent medicare annual wellness visit.    Assessment/Plan:  Chronic Problems Addressed Today: Lumbar disc disease Still continues to be an issue though has improved some.  He will follow-up with sports medicine as needed.  Essential hypertension At goal today.  Continue amlodipine 5 mg daily, atenolol 100 mg daily, and lisinopril 40 mg daily.  Dyslipidemia On Lipitor 10 mg daily.  Check labs today including lipid panel.  BPH associated with nocturia Follows with urology.  CAD (coronary artery disease), native coronary artery On Eliquis and statin.  AAA (abdominal aortic aneurysm) ([place]) Will need repeat ultrasound early 2023.  Stroke [facility]) Follows with neurology.  Continue current dose of Eliquis 5 mg twice daily.  Hyperglycemia Check A1c.  Persistent atrial fibrillation Rate controlled.  Anticoagulated on Eliquis.  Check labs today.    Body mass index is 29.16 kg/m. / Overweight    Preventative Healthcare: Check labs today.  Up-to-date on cancer screening.  Patient Counseling(The following topics were reviewed and/or handout was given):  -Nutrition: Stressed importance of moderation in sodium/caffeine intake, saturated fat and cholesterol, caloric balance, sufficient intake of fresh fruits, vegetables, and fiber.  -Stressed the importance of regular exercise.   -Substance Abuse: Discussed cessation/primary prevention of tobacco, alcohol, or other drug use; driving or other dangerous activities under the influence; availability of treatment for abuse.   -Injury prevention: Discussed safety belts, safety helmets, smoke detector, smoking near bedding or upholstery.   -Sexuality: Discussed sexually transmitted diseases, partner selection, use of condoms, avoidance of unintended pregnancy and contraceptive alternatives.   -Dental health:  Discussed importance of regular tooth brushing, flossing, and dental visits.  -Health maintenance and immunizations reviewed. Please refer to Health maintenance section.  During the course of the visit the patient was educated and counseled about appropriate screening and preventive services including:        Fall prevention   Nutrition Physical Activity Weight Management Cognition  Return to care in 1 year for next preventative visit.     Subjective:  HPI:  See A/p for status of chronic conditions.  Health Risk Assessment: Patient considers his overall health to be good. He has no difficulty performing the following: . Preparing food and eating . Bathing  . Getting dressed . Using the toilet . Shopping . Managing Finances . Moving around from place to place  He has not had any falls within the past year.   Depression screen PHQ 2/9 03/23/2020  Decreased Interest 0  Down, Depressed, Hopeless 0  PHQ - 2 Score 0  Some recent data might be hidden   Lifestyle Factors: Diet: Balanced. Plenty of fruits and vegetables.  Exercise: Active with yard work.   Patient Care Team: Vivi Barrack, MD as PCP - General (Family Medicine) Jacolyn Reedy, MD as Consulting Physician (Cardiology) Rutherford Guys, MD as Consulting Physician (Ophthalmology) Bettina Gavia Hilton Cork, MD as Consulting Physician (Cardiology) Edrick Kins, DPM as Consulting Physician (Podiatry) Lyndee Hensen, PT as Physical Therapist (Physical Therapy)   ROS: Per HPI, otherwise a complete review of systems was negative.   PMH:  The following were reviewed and entered/updated in epic: Past Medical History:  Diagnosis Date  . BPH (benign prostatic hypertrophy)   . BPH associated with nocturia 08/29/2013  . BPH with obstruction/lower urinary tract symptoms 08/14/2019  . CAD (coronary artery disease)   . CAD (coronary artery disease),  native coronary artery    PTCA of RCA 1986, PTCA of LAD 1992,  Negative  treadmill Cardiolite 2011   . Chronic dermatitis of hands 10/12/2015  . Chronic venous insufficiency   . Coagulation disorder ([place]) 10/26/2018  . DVT (deep venous thrombosis) ([place])   . Dyslipidemia   . Eczema   . Erectile dysfunction 07/11/2012  . Essential hypertension   . GERD 08/04/2006      . GERD (gastroesophageal reflux disease)   . Hyperglycemia 07/08/2019  . Hyperlipidemia   . Hypertension   . Incomplete emptying of bladder 08/14/2019  . Long term current use of anticoagulant therapy   . Lumbar disc disease   . Overweight (BMI 25.0-29.9) 06/18/2015  . Persistent atrial fibrillation ([place]) 04/10/2018  . Personal history of DVT (deep vein thrombosis)    Initially in 1998 with recurrence in 2002 now on chronic warfarin   . Soft tissue lesion of foot 08/27/2015  . Urinary tract infection with hematuria 08/14/2019   Patient Active Problem List   Diagnosis Date Noted  . AAA (abdominal aortic aneurysm) ([place]) 03/06/2020  . Porokeratosis 02/25/2020  . Stroke ([place]) 02/16/2020  . Eczema   . DVT (deep venous thrombosis) ([place])   . CAD (coronary artery disease)   . Recurrent UTI 08/14/2019  . Hyperglycemia 07/08/2019  . Persistent atrial fibrillation ([place]) 04/10/2018  . Chronic dermatitis of hands 10/12/2015  . Soft tissue lesion of foot 08/27/2015  . BPH associated with nocturia 08/29/2013  . CAD (coronary artery disease), native coronary artery   . Long term current use of anticoagulant therapy   . Personal history of DVT (deep vein thrombosis)   . Chronic venous insufficiency   . Erectile dysfunction 07/11/2012  . Lumbar disc disease   . GERD 08/04/2006  . Dyslipidemia   . Essential hypertension    Past Surgical History:  Procedure Laterality Date  . CARDIAC CATHETERIZATION    . CARPAL TUNNEL RELEASE  04/21/2011   Procedure: CARPAL TUNNEL RELEASE;  Surgeon: Tennis Must, MD;  Location: [place];  Service: Orthopedics;  Laterality: Left;  . CARPAL TUNNEL RELEASE   05/23/2011   Procedure: CARPAL TUNNEL RELEASE;  Surgeon: Tennis Must, MD;  Location: [place];  Service: Orthopedics;  Laterality: Right;  . COLON SURGERY  2010 and 2011 colostomy reversal  . CORONARY ANGIOPLASTY    . EYE SURGERY     repair of macular hole  . NASAL SEPTUM SURGERY    . precutaneous transluminal coronary angioplasty    . right hernia    . TONSILLECTOMY AND ADENOIDECTOMY      Family History  Problem Relation Age of Onset  . Heart disease Father   . Heart attack Father   . Atrial fibrillation Father   . Heart disease Mother   . Atrial fibrillation Mother   . Stroke Mother   . AAA (abdominal aortic aneurysm) Brother     Medications- reviewed and updated Current Outpatient Medications  Medication Sig Dispense Refill  . acetaminophen (TYLENOL) 650 MG CR tablet Take 650 mg by mouth as needed for pain.    Marland Kitchen amLODipine (NORVASC) 5 MG tablet TAKE 1 TABLET BY MOUTH EVERY OTHER DAY 45 tablet 3  . apixaban (ELIQUIS) 5 MG TABS tablet Take 1 tablet (5 mg total) by mouth 2 (two) times daily. 60 tablet 11  . atenolol (TENORMIN) 100 MG tablet TAKE 1 TABLET BY MOUTH EVERY DAY 90 tablet 1  . atorvastatin (LIPITOR) 40 MG  tablet TAKE 1/2 TABLET BY MOUTH DAILY 45 tablet 3  . Coenzyme Q10 (COQ10) 200 MG CAPS Take 1 capsule by mouth daily.     . Cyanocobalamin (VITAMIN B-12) 1000 MCG SUBL Place 1 tablet under the tongue daily.    . hydroxypropyl methylcellulose / hypromellose (ISOPTO TEARS / GONIOVISC) 2.5 % ophthalmic solution Place 1 drop into both eyes 4 (four) times daily as needed for dry eyes.    Marland Kitchen lisinopril (ZESTRIL) 40 MG tablet TAKE 1 TABLET BY MOUTH EVERY DAY 90 tablet 0  . nitroGLYCERIN (NITROSTAT) 0.4 MG SL tablet Dissolve 1 tablet under the tongue every 5 minutes as needed 90 tablet 1  . tamsulosin (FLOMAX) 0.4 MG CAPS capsule TAKE 1 CAPSULE (0.4 MG TOTAL) BY MOUTH IN THE MORNING AND AT BEDTIME. 180 capsule 0   No current facility-administered medications  for this visit.    Allergies-reviewed and updated Allergies  Allergen Reactions  . Other Other (See Comments)    Some form of numbing medication used by dentist, almost died  . Simvastatin Other (See Comments)    Muscle aches     Social History   Socioeconomic History  . Marital status: Married    Spouse name: Not on file  . Number of children: 3  . Years of education: 57  . Highest education level: Not on file  Occupational History  . Occupation: Retired  Tobacco Use  . Smoking status: Former Smoker    Packs/day: 1.00    Years: 60.00    Pack years: 60.00    Types: Cigarettes    Quit date: 03/25/1998    Years since quitting: 22.3  . Smokeless tobacco: Never Used  Vaping Use  . Vaping Use: Never used  Substance and Sexual Activity  . Alcohol use: Yes    Alcohol/week: 2.0 - 3.0 standard drinks    Types: 2 - 3 Glasses of wine per week    Comment: 1 glass of red wine every 2-3 night   . Drug use: No  . Sexual activity: Not on file  Other Topics Concern  . Not on file  Social History Narrative   Fun: Working outside in yard, cutting wood    Denies abuse and feels safe at home.    Right handed   Lives at home with wife, daughter, granddaughter, and son (periodically)   Social Determinants of Health   Financial Resource Strain: Not on file  Food Insecurity: Not on file  Transportation Needs: Not on file  Physical Activity: Not on file  Stress: Not on file  Social Connections: Not on file        Objective:  Physical Exam: BP 107/63   Pulse 68   Temp 97.6 F (36.4 C) (Temporal)   Ht 6' (1.829 m)   Wt 215 lb (97.5 kg)   SpO2 95%   BMI 29.16 kg/m   Body mass index is 29.16 kg/m. Wt Readings from Last 3 Encounters:  07/20/20 215 lb (97.5 kg)  05/14/20 217 lb 1.3 oz (98.5 kg)  05/12/20 217 lb (98.4 kg)   Gen: NAD, resting comfortably HEENT: TMs normal bilaterally. OP clear. No thyromegaly noted.  CV: Irregular with no murmurs appreciated Pulm: NWOB,  CTAB with no crackles, wheezes, or rhonchi GI: Normal bowel sounds present. Soft, Nontender, Nondistended. MSK: no edema, cyanosis, or clubbing noted Skin: warm, dry Neuro: CN2-12 grossly intact. Strength 5/5 in upper and lower extremities. Reflexes symmetric and intact bilaterally. Normal minicog with 3/3 delayed word recall.  Psych: Normal affect and thought content     Gavino Fouch M. [name], MD 07/20/2020 2:58 PM

## 2020-07-20 NOTE — Assessment & Plan Note (Signed)
Rate controlled.  Anticoagulated on Eliquis.  Check labs today.

## 2020-07-20 NOTE — Assessment & Plan Note (Signed)
Still continues to be an issue though has improved some.  He will follow-up with sports medicine as needed.

## 2020-07-20 NOTE — Assessment & Plan Note (Signed)
Follows with neurology.  Continue current dose of Eliquis 5 mg twice daily.

## 2020-07-21 ENCOUNTER — Ambulatory Visit: Payer: Medicare HMO | Admitting: Adult Health

## 2020-07-21 LAB — CBC
HCT: 44.5 % (ref 39.0–52.0)
Hemoglobin: 15.1 g/dL (ref 13.0–17.0)
MCHC: 33.9 g/dL (ref 30.0–36.0)
MCV: 88.2 fl (ref 78.0–100.0)
Platelets: 178 10*3/uL (ref 150.0–400.0)
RBC: 5.05 Mil/uL (ref 4.22–5.81)
RDW: 14.8 % (ref 11.5–15.5)
WBC: 6.3 10*3/uL (ref 4.0–10.5)

## 2020-07-21 LAB — HEMOGLOBIN A1C: Hgb A1c MFr Bld: 5.9 % (ref 4.6–6.5)

## 2020-07-21 LAB — COMPREHENSIVE METABOLIC PANEL
ALT: 19 U/L (ref 0–53)
AST: 24 U/L (ref 0–37)
Albumin: 4 g/dL (ref 3.5–5.2)
Alkaline Phosphatase: 51 U/L (ref 39–117)
BUN: 15 mg/dL (ref 6–23)
CO2: 21 mEq/L (ref 19–32)
Calcium: 9.7 mg/dL (ref 8.4–10.5)
Chloride: 106 mEq/L (ref 96–112)
Creatinine, Ser: 0.92 mg/dL (ref 0.40–1.50)
GFR: 78.53 mL/min (ref >60.00)
Glucose, Bld: 98 mg/dL (ref 70–99)
Potassium: 4.4 mEq/L (ref 3.5–5.1)
Sodium: 140 mEq/L (ref 135–145)
Total Bilirubin: 0.9 mg/dL (ref 0.2–1.2)
Total Protein: 6.4 g/dL (ref 6.0–8.3)

## 2020-07-21 LAB — LIPID PANEL
Cholesterol: 124 mg/dL (ref 0–200)
HDL: 49.6 mg/dL (ref >39.00)
LDL Cholesterol: 60 mg/dL (ref 0–99)
NonHDL: 74.13
Total CHOL/HDL Ratio: 2
Triglycerides: 69 mg/dL (ref 0.0–149.0)
VLDL: 13.8 mg/dL (ref 0.0–40.0)

## 2020-07-21 LAB — VITAMIN B12: Vitamin B-12: 919 pg/mL — ABNORMAL HIGH (ref 211–911)

## 2020-07-21 LAB — TSH: TSH: 3.18 u[IU]/mL (ref 0.35–4.50)

## 2020-07-22 ENCOUNTER — Telehealth: Payer: Self-pay

## 2020-07-22 NOTE — Progress Notes (Signed)
Please inform patient of the following:  Labs are all stable. Would like for him to keep up the good work and we can recheck in a year.  [name]. [name], MD 07/22/2020 9:24 AM

## 2020-07-22 NOTE — Telephone Encounter (Signed)
.   LAST APPOINTMENT DATE: 07/20/2020   NEXT APPOINTMENT DATE:@Visit  date not found  MEDICATION: Coenzyme Q10 (COQ10) 200 MG CAPS  Cyanocobalamin (VITAMIN B-12) 1000 MCG SUBL   PHARMACY:[facility] # [address], [place]

## 2020-07-22 NOTE — Telephone Encounter (Signed)
Patient stated need Rx to be send to pharmacy for insurance purpose Please advise

## 2020-07-23 ENCOUNTER — Other Ambulatory Visit: Payer: Self-pay

## 2020-07-23 MED ORDER — COQ10 200 MG PO CAPS: 1.0000 | ORAL_CAPSULE | Freq: Every day | ORAL | 0 refills | Status: DC

## 2020-07-23 NOTE — Telephone Encounter (Signed)
He should continue 1037mcg daily.  [name]. [name], MD 07/23/2020 12:43 PM

## 2020-07-23 NOTE — Telephone Encounter (Signed)
Left message on voicemail to call office.  

## 2020-07-23 NOTE — Telephone Encounter (Signed)
Ok with me. Please place any necessary orders. 

## 2020-07-23 NOTE — Telephone Encounter (Signed)
Does pt need to continue rx refill for b12, or can he pick up OTC b12? Pt's value is at 919.

## 2020-07-24 ENCOUNTER — Other Ambulatory Visit: Payer: Self-pay | Admitting: *Deleted

## 2020-07-24 MED ORDER — VITAMIN B-12 1000 MCG SL SUBL: 1.0000 | SUBLINGUAL_TABLET | Freq: Every day | SUBLINGUAL | 0 refills | Status: AC

## 2020-07-24 NOTE — Telephone Encounter (Signed)
Left message on voicemail to call office.  

## 2020-08-07 ENCOUNTER — Other Ambulatory Visit: Payer: Self-pay | Admitting: Family Medicine

## 2020-08-19 ENCOUNTER — Ambulatory Visit: Payer: Medicare HMO | Admitting: Podiatry

## 2020-08-19 ENCOUNTER — Encounter: Payer: Self-pay | Admitting: Podiatry

## 2020-08-19 ENCOUNTER — Other Ambulatory Visit: Payer: Self-pay

## 2020-08-19 DIAGNOSIS — Q828 Other specified congenital malformations of skin: Secondary | ICD-10-CM

## 2020-08-19 DIAGNOSIS — I872 Venous insufficiency (chronic) (peripheral): Secondary | ICD-10-CM

## 2020-08-19 DIAGNOSIS — M79676 Pain in unspecified toe(s): Secondary | ICD-10-CM

## 2020-08-19 DIAGNOSIS — B351 Tinea unguium: Secondary | ICD-10-CM

## 2020-08-19 DIAGNOSIS — D689 Coagulation defect, unspecified: Secondary | ICD-10-CM

## 2020-08-19 NOTE — Progress Notes (Signed)
This patient returns to my office for at risk foot care.  This patient requires this care by a professional since this patient will be at risk due to having venous insufficiency and coagulation defect.  Patient is taking coumadin.  This patient is unable to cut nails himself since the patient cannot reach his nails.These nails are painful walking and wearing shoes. Painful callus left foot has returned. This patient presents for at risk foot care today.  General Appearance  Alert, conversant and in no acute stress.  Vascular  Dorsalis pedis and posterior tibial  pulses are palpable  bilaterally.  Capillary return is within normal limits  bilaterally. Temperature is within normal limits  bilaterally.  Neurologic  Senn-Weinstein monofilament wire test within normal limits  bilaterally. Muscle power within normal limits bilaterally.  Nails Thick disfigured discolored nails with subungual debris  from hallux to fifth toes bilaterally. No evidence of bacterial infection or drainage bilaterally.   Orthopedic  No limitations of motion  feet .  No crepitus or effusions noted.  No bony pathology or digital deformities noted.  Skin  normotropic skin  noted bilaterally.  No signs of infections or ulcers noted.   Symptomatic porokeratosis sub 5th met left foot.  Onychomycosis  Pain in right toes  Pain in left toes  Porokeratosis left foot.  Consent was obtained for treatment procedures.   Mechanical debridement of nails 1-5  bilaterally performed with a nail nipper.  Filed with dremel without incident. Debride porokeratosis debrided with # 15 blade.   Return office visit   10 weeks                  Told patient to return for periodic foot care and evaluation due to potential at risk complications.   Harmonie Verrastro DPM  

## 2020-08-25 ENCOUNTER — Other Ambulatory Visit: Payer: Self-pay | Admitting: *Deleted

## 2020-08-27 ENCOUNTER — Ambulatory Visit: Payer: Medicare HMO | Admitting: Adult Health

## 2020-08-27 ENCOUNTER — Encounter: Payer: Self-pay | Admitting: Adult Health

## 2020-08-27 VITALS — BP 154/87 | HR 73 | Ht 72.0 in | Wt 217.6 lb

## 2020-08-27 DIAGNOSIS — I10 Essential (primary) hypertension: Secondary | ICD-10-CM

## 2020-08-27 DIAGNOSIS — I63412 Cerebral infarction due to embolism of left middle cerebral artery: Secondary | ICD-10-CM | POA: Diagnosis not present

## 2020-08-27 DIAGNOSIS — E785 Hyperlipidemia, unspecified: Secondary | ICD-10-CM | POA: Diagnosis not present

## 2020-08-27 DIAGNOSIS — I4819 Other persistent atrial fibrillation: Secondary | ICD-10-CM

## 2020-08-27 DIAGNOSIS — G250 Essential tremor: Secondary | ICD-10-CM

## 2020-08-27 NOTE — Progress Notes (Signed)
[facility] [address] [place] [place] [employer]. [place] [id number] [phone]       STROKE FOLLOW UP NOTE  William Norman Date of Birth:  08/31/1940 Medical Record Number:  [id number]   Reason for Referral: stroke follow up    SUBJECTIVE:   CHIEF COMPLAINT:  Chief Complaint  Patient presents with   Cerebrovascular Accident    Rm 2,  4 month FU  "head starts shaking just sitting still, feels like my whole body shakes, not a new issue and no worse"    HPI:   Today, 08/27/2020, William Norman returns for 1-month stroke follow-up unaccompanied.  Stable from stroke standpoint without new stroke/TIA symptoms. Compliant on Eliquis and atorvastatin without associated side effects.  Blood pressure today 154/87 -does not routinely monitor at home as typically stable.  Upper extremity and facial tremor stable without worsening -does not interfere with daily activity or functioning.  No new concerns at this time.    History provided for reference purposes only Initial visit 03/23/2020 JM: William Norman is being seen for hospital follow-up unaccompanied.  He has been overall stable since discharge and reports occasional word finding difficulties and short-term memory impairment.  He continues to live independently maintaining ADLs and IADLs without difficulty.  Denies new stroke/TIA symptoms.  He does report a longstanding history of BUE and facial tremors which worsen with increased anxiety or stress has been present over the past year.  He does report family history (maternal side) of tremors possibly essential but denies history of Parkinson's disease.  He has remained on Eliquis and atorvastatin without side effects.  Blood pressure today 112/70.  He is currently working with PT for chronic lower back pain.  No further concerns at this time.  Stroke admission 02/16/2020 William Norman is a 80 y.o. male with PMHx of HTN, Afib on Warfarin, h/o DVT, BPH, HLD, CAD presented  to [facility] ED on 02/16/2020 after an episode of transient aphasia at home.  Personally reviewed hospitalization pertinent progress notes, lab work and imaging with summary provided.  Evaluated by Dr. Leonie Man stroke work-up revealing left posterior insula and parietal operculum infarcts likely secondary to atrial fibrillation with warfarin held for upcoming cystoscopy.  CTA head/neck unremarkable.  Recommended changing warfarin to Eliquis for secondary stroke prevention.  History of HTN stable on amlodipine, atenolol and lisinopril.  History of HLD on atorvastatin 40 mg daily with LDL 86.  No history or evidence of DM with A1c 5.5.  Other stroke risk factors include advanced age and CAD but no prior stroke history.  Evaluated by therapies and discharged home in stable condition without therapy needs.  Stroke: Left posterior insula and parietal operculum, embolic secondary to known A. fib off warfarin for upcoming cystoscopy CT head: No acute intracranial abnormalities. Chronic atrophy and small vessel ischemia. CTA head - No emergent large vessel occlusion or hemodynamically significant stenosis of the head  CTA neck - No emergent large vessel occlusion or hemodynamically significant stenosis of the neck. CT perfusion - not ordered MRI - Acute infarct left posterior insula and parietal operculum without hemorrhage. Moderate chronic microvascular ischemic change in the white matter. Echo complete Bubble study: Agitated saline contrast bubble study was negative, with no evidence of any interatrial shunt. LDL 86 HgbA1c 5.5 warfarin daily prior to admission, was transitioned to Eliquis (apixaban) daily this admission. Therapy recommendations:  No PT follow up, Supervision-Intermittent  Disposition:  Likely home      ROS:   14 system  review of systems performed and negative with exception of those listed in HPI  PMH:  Past Medical History:  Diagnosis Date   BPH (benign prostatic hypertrophy)    BPH  associated with nocturia 08/29/2013   BPH with obstruction/lower urinary tract symptoms 08/14/2019   CAD (coronary artery disease)    CAD (coronary artery disease), native coronary artery    PTCA of RCA 1986, PTCA of LAD 1992,  Negative treadmill Cardiolite 2011    Chronic dermatitis of hands 10/12/2015   Chronic venous insufficiency    Coagulation disorder ([place]) 10/26/2018   DVT (deep venous thrombosis) (HCC)    Dyslipidemia    Eczema    Erectile dysfunction 07/11/2012   Essential hypertension    GERD 08/04/2006       GERD (gastroesophageal reflux disease)    Hyperglycemia 07/08/2019   Hyperlipidemia    Hypertension    Incomplete emptying of bladder 08/14/2019   Long term current use of anticoagulant therapy    Lumbar disc disease    Overweight (BMI 25.0-29.9) 06/18/2015   Persistent atrial fibrillation ([place]) 04/10/2018   Personal history of DVT (deep vein thrombosis)    Initially in 1998 with recurrence in 2002 now on chronic warfarin    Soft tissue lesion of foot 08/27/2015   Stroke [facility])    Urinary tract infection with hematuria 08/14/2019    PSH:  Past Surgical History:  Procedure Laterality Date   CARDIAC CATHETERIZATION     CARPAL TUNNEL RELEASE  04/21/2011   Procedure: CARPAL TUNNEL RELEASE;  Surgeon: Tennis Must, MD;  Location: [place];  Service: Orthopedics;  Laterality: Left;   CARPAL TUNNEL RELEASE  05/23/2011   Procedure: CARPAL TUNNEL RELEASE;  Surgeon: Tennis Must, MD;  Location: [place];  Service: Orthopedics;  Laterality: Right;   COLON SURGERY  2010 and 2011 colostomy reversal   CORONARY ANGIOPLASTY     EYE SURGERY     repair of macular hole   NASAL SEPTUM SURGERY     precutaneous transluminal coronary angioplasty     right hernia     TONSILLECTOMY AND ADENOIDECTOMY      Social History:  Social History   Socioeconomic History   Marital status: Married    Spouse name: Not on file   Number of children: 3    Years of education: 16   Highest education level: Not on file  Occupational History   Occupation: [employer]  Tobacco Use   Smoking status: Former    Packs/day: 1.00    Years: 60.00    Pack years: 60.00    Types: Cigarettes    Quit date: 03/25/1998    Years since quitting: 22.4   Smokeless tobacco: Never  Vaping Use   Vaping Use: Never used  Substance and Sexual Activity   Alcohol use: Yes    Alcohol/week: 2.0 - 3.0 standard drinks    Types: 2 - 3 Glasses of wine per week    Comment: 1 glass of red wine every 2-3 night    Drug use: No   Sexual activity: Not on file  Other Topics Concern   Not on file  Social History Narrative   Fun: Working outside in yard, cutting wood    Denies abuse and feels safe at home.    Right handed   Lives at home with wife, daughter, granddaughter, and son (periodically)   Social Determinants of Health   Financial Resource Strain: Not on file  Food Insecurity: Not  on file  Transportation Needs: Not on file  Physical Activity: Not on file  Stress: Not on file  Social Connections: Not on file  Intimate Partner Violence: Not on file    Family History:  Family History  Problem Relation Age of Onset   Heart disease Father    Heart attack Father    Atrial fibrillation Father    Heart disease Mother    Atrial fibrillation Mother    Stroke Mother    AAA (abdominal aortic aneurysm) Brother     Medications:   Current Outpatient Medications on File Prior to Visit  Medication Sig Dispense Refill   acetaminophen (TYLENOL) 650 MG CR tablet Take 650 mg by mouth as needed for pain.     amLODipine (NORVASC) 5 MG tablet TAKE 1 TABLET BY MOUTH EVERY OTHER DAY 45 tablet 3   apixaban (ELIQUIS) 5 MG TABS tablet Take 1 tablet (5 mg total) by mouth 2 (two) times daily. 60 tablet 11   atenolol (TENORMIN) 100 MG tablet TAKE 1 TABLET BY MOUTH EVERY DAY 90 tablet 1   atorvastatin (LIPITOR) 40 MG tablet TAKE 1/2 TABLET BY MOUTH DAILY 45 tablet 3   Coenzyme Q10  (COQ10) 200 MG CAPS Take 1 capsule by mouth daily. 90 capsule 0   Cyanocobalamin (VITAMIN B-12) 1000 MCG SUBL Place 1 tablet (1,000 mcg total) under the tongue daily. 90 tablet 0   hydroxypropyl methylcellulose / hypromellose (ISOPTO TEARS / GONIOVISC) 2.5 % ophthalmic solution Place 1 drop into both eyes 4 (four) times daily as needed for dry eyes.     lisinopril (ZESTRIL) 40 MG tablet TAKE 1 TABLET BY MOUTH EVERY DAY 90 tablet 0   nitroGLYCERIN (NITROSTAT) 0.4 MG SL tablet Dissolve 1 tablet under the tongue every 5 minutes as needed 90 tablet 1   PFIZER-BIONT COVID-19 VAC-TRIS SUSP injection      tamsulosin (FLOMAX) 0.4 MG CAPS capsule TAKE 1 CAPSULE (0.4 MG TOTAL) BY MOUTH IN THE MORNING AND AT BEDTIME. 180 capsule 0   No current facility-administered medications on file prior to visit.    Allergies:   Allergies  Allergen Reactions   Other Other (See Comments)    Some form of numbing medication used by dentist, almost died   Simvastatin Other (See Comments)    Muscle aches       OBJECTIVE:  Physical Exam  Vitals:   08/27/20 1503  BP: (!) 154/87  Pulse: 73  Weight: 217 lb 9.6 oz (98.7 kg)  Height: 6' (1.829 m)   Body mass index is 29.51 kg/m. No results found.   General: well developed, well nourished,  very pleasant elderly Caucasian male, seated, in no evident distress Head: head normocephalic and atraumatic.   Neck: supple with no carotid or supraclavicular bruits Cardiovascular: irregular rate and rhythm, no murmurs Musculoskeletal: no deformity Skin:  no rash/petichiae Vascular:  Normal pulses all extremities   Neurologic Exam Mental Status: Awake and fully alert.   Fluent speech and language.  Oriented to place and time. Recent memory subjectively impaired and remote memory intact. Attention span, concentration and fund of knowledge appropriate. Mood and affect appropriate.  Cranial Nerves: Pupils equal, briskly reactive to light. Extraocular movements full  without nystagmus. Visual fields full to confrontation. Hearing impaired bilaterally. Facial sensation intact. Face, tongue, palate moves normally and symmetrically.  Motor: Normal bulk and tone. Normal strength in all tested extremity muscles Sensory.: intact to touch , pinprick , position and vibratory sensation Coordination: Rapid alternating movements normal  in all extremities. heel-to-shin performed accurately bilaterally.  Mild head and BUE tremor with outstretched arms, amplified with finger-to-nose testing.  No evidence of cogwheel rigidity, bradykinesia or resting tremor Gait and Station: Arises from chair without difficulty. Stance is normal. Gait demonstrates normal stride length and balance without use of assistive device.  Good arm swing and no evidence of pill-rolling tremor.  Able to tandem walk and heel toe with only slight difficulty. Reflexes: 1+ and symmetric. Toes downgoing.        ASSESSMENT: William Norman is a 80 y.o. year old male presented with transient aphasia on 02/16/2020 with evidence of left posterior insula and parietal operculum, embolic secondary to known AF off warfarin for outpatient cystoscopy. Vascular risk factors include atrial fibrillation, HTN, HLD, hx of DVT, CAD and advanced age.      PLAN:  L MCA stroke :  Greatly recovered without residual stroke deficit Continue Eliquis (apixaban) daily  and atorvastatin for secondary stroke prevention.   Discussed secondary stroke prevention measures and importance of close PCP follow up for aggressive stroke risk factor management  Persistent atrial fibrillation: On Eliquis 5 mg twice daily for CHA2DS2-VASc score of at least 5 -request ongoing prescribing and monitoring per cardiology HTN: BP goal <130/90.  Slightly elevated today typically stable on amlodipine, atenolol and lisinopril per PCP/cardiology HLD: LDL goal <70. Recent LDL 60 (07/2020) on atorvastatin 20 mg daily per PCP/cardiology.  Tremor:  stable appearance of essential tremor type etiology.  Unable to appreciate Parkinson type symptoms on exam.  Does not interfere with daily activity or functioning  - continue to monitor s/p   Follow up in 6 months or call earlier if needed  CC:  [facility] provider: Dr. Julian Reil, William Greenhouse, MD    I spent 31 minutes of face-to-face and non-face-to-face time with patient.  This included previsit chart review, lab review, study review, electronic health record documentation, patient education regarding prior stroke including etiology, secondary stroke prevention measures and importance of managing stroke risk factors, tremors and further education and answered all other questions to patient satisfaction   Frann Rider, [facility]  [facility] [address] [place], [place] [address]  Phone [phone] Fax [phone] Note: This document was prepared with digital dictation and possible smart phrase technology. Any transcriptional errors that result from this process are unintentional.

## 2020-08-27 NOTE — Progress Notes (Signed)
I agree with the above plan 

## 2020-08-28 ENCOUNTER — Inpatient Hospital Stay (HOSPITAL_BASED_OUTPATIENT_CLINIC_OR_DEPARTMENT_OTHER)
Admission: EM | Admit: 2020-08-28 | Discharge: 2020-09-18 | DRG: 329 | Disposition: A | Discharge status: Skilled Nursing Facility | Payer: Medicare HMO | Attending: Family Medicine | Admitting: Family Medicine

## 2020-08-28 ENCOUNTER — Encounter (HOSPITAL_BASED_OUTPATIENT_CLINIC_OR_DEPARTMENT_OTHER): Payer: Self-pay | Admitting: Emergency Medicine

## 2020-08-28 ENCOUNTER — Emergency Department (HOSPITAL_BASED_OUTPATIENT_CLINIC_OR_DEPARTMENT_OTHER): Payer: Medicare HMO

## 2020-08-28 ENCOUNTER — Other Ambulatory Visit: Payer: Self-pay

## 2020-08-28 DIAGNOSIS — L7632 Postprocedural hematoma of skin and subcutaneous tissue following other procedure: Secondary | ICD-10-CM | POA: Diagnosis not present

## 2020-08-28 DIAGNOSIS — E43 Unspecified severe protein-calorie malnutrition: Secondary | ICD-10-CM | POA: Insufficient documentation

## 2020-08-28 DIAGNOSIS — Z8673 Personal history of transient ischemic attack (TIA), and cerebral infarction without residual deficits: Secondary | ICD-10-CM

## 2020-08-28 DIAGNOSIS — R109 Unspecified abdominal pain: Secondary | ICD-10-CM

## 2020-08-28 DIAGNOSIS — Z978 Presence of other specified devices: Secondary | ICD-10-CM

## 2020-08-28 DIAGNOSIS — E785 Hyperlipidemia, unspecified: Secondary | ICD-10-CM | POA: Diagnosis present

## 2020-08-28 DIAGNOSIS — I2510 Atherosclerotic heart disease of native coronary artery without angina pectoris: Secondary | ICD-10-CM | POA: Diagnosis present

## 2020-08-28 DIAGNOSIS — K5651 Intestinal adhesions [bands], with partial obstruction: Secondary | ICD-10-CM | POA: Diagnosis not present

## 2020-08-28 DIAGNOSIS — N390 Urinary tract infection, site not specified: Secondary | ICD-10-CM | POA: Diagnosis not present

## 2020-08-28 DIAGNOSIS — Z86718 Personal history of other venous thrombosis and embolism: Secondary | ICD-10-CM

## 2020-08-28 DIAGNOSIS — K56609 Unspecified intestinal obstruction, unspecified as to partial versus complete obstruction: Secondary | ICD-10-CM | POA: Diagnosis not present

## 2020-08-28 DIAGNOSIS — Z9861 Coronary angioplasty status: Secondary | ICD-10-CM

## 2020-08-28 DIAGNOSIS — I10 Essential (primary) hypertension: Secondary | ICD-10-CM | POA: Diagnosis present

## 2020-08-28 DIAGNOSIS — N401 Enlarged prostate with lower urinary tract symptoms: Secondary | ICD-10-CM | POA: Diagnosis present

## 2020-08-28 DIAGNOSIS — Z95828 Presence of other vascular implants and grafts: Secondary | ICD-10-CM

## 2020-08-28 DIAGNOSIS — I4819 Other persistent atrial fibrillation: Secondary | ICD-10-CM | POA: Diagnosis not present

## 2020-08-28 DIAGNOSIS — Z20822 Contact with and (suspected) exposure to covid-19: Secondary | ICD-10-CM | POA: Diagnosis present

## 2020-08-28 DIAGNOSIS — Z0189 Encounter for other specified special examinations: Secondary | ICD-10-CM

## 2020-08-28 DIAGNOSIS — R188 Other ascites: Secondary | ICD-10-CM

## 2020-08-28 DIAGNOSIS — R319 Hematuria, unspecified: Secondary | ICD-10-CM

## 2020-08-28 DIAGNOSIS — Z4659 Encounter for fitting and adjustment of other gastrointestinal appliance and device: Secondary | ICD-10-CM

## 2020-08-28 DIAGNOSIS — Z87891 Personal history of nicotine dependence: Secondary | ICD-10-CM

## 2020-08-28 DIAGNOSIS — Y836 Removal of other organ (partial) (total) as the cause of abnormal reaction of the patient, or of later complication, without mention of misadventure at the time of the procedure: Secondary | ICD-10-CM | POA: Diagnosis not present

## 2020-08-28 DIAGNOSIS — K219 Gastro-esophageal reflux disease without esophagitis: Secondary | ICD-10-CM | POA: Diagnosis present

## 2020-08-28 DIAGNOSIS — R5381 Other malaise: Secondary | ICD-10-CM | POA: Diagnosis present

## 2020-08-28 DIAGNOSIS — E876 Hypokalemia: Secondary | ICD-10-CM | POA: Diagnosis present

## 2020-08-28 DIAGNOSIS — Z7901 Long term (current) use of anticoagulants: Secondary | ICD-10-CM

## 2020-08-28 DIAGNOSIS — Z6828 Body mass index (BMI) 28.0-28.9, adult: Secondary | ICD-10-CM

## 2020-08-28 LAB — CBC
HCT: 48.2 % (ref 39.0–52.0)
Hemoglobin: 15.6 g/dL (ref 13.0–17.0)
MCH: 29.8 pg (ref 26.0–34.0)
MCHC: 32.4 g/dL (ref 30.0–36.0)
MCV: 92 fL (ref 80.0–100.0)
Platelets: 192 10*3/uL (ref 150–400)
RBC: 5.24 MIL/uL (ref 4.22–5.81)
RDW: 14.2 % (ref 11.5–15.5)
WBC: 6.2 10*3/uL (ref 4.0–10.5)
nRBC: 0 % (ref 0.0–0.2)

## 2020-08-28 LAB — COMPREHENSIVE METABOLIC PANEL
ALT: 18 U/L (ref 0–44)
AST: 24 U/L (ref 15–41)
Albumin: 4.2 g/dL (ref 3.5–5.0)
Alkaline Phosphatase: 52 U/L (ref 38–126)
Anion gap: 7 (ref 5–15)
BUN: 17 mg/dL (ref 8–23)
CO2: 28 mmol/L (ref 22–32)
Calcium: 9.2 mg/dL (ref 8.9–10.3)
Chloride: 105 mmol/L (ref 98–111)
Creatinine, Ser: 0.86 mg/dL (ref 0.61–1.24)
GFR, Estimated: >60 mL/min (ref >60)
Glucose, Bld: 97 mg/dL (ref 70–99)
Potassium: 4 mmol/L (ref 3.5–5.1)
Sodium: 140 mmol/L (ref 135–145)
Total Bilirubin: 1.2 mg/dL (ref 0.3–1.2)
Total Protein: 6.8 g/dL (ref 6.5–8.1)

## 2020-08-28 LAB — URINALYSIS, ROUTINE W REFLEX MICROSCOPIC
Bilirubin Urine: NEGATIVE
Glucose, UA: NEGATIVE mg/dL
Ketones, ur: NEGATIVE mg/dL
Nitrite: POSITIVE — AB
Specific Gravity, Urine: 1.021 (ref 1.005–1.030)
WBC, UA: >50 WBC/hpf — ABNORMAL HIGH (ref 0–5)
pH: 6 (ref 5.0–8.0)

## 2020-08-28 LAB — LIPASE, BLOOD: Lipase: 15 U/L (ref 11–51)

## 2020-08-28 IMAGING — CT CT ABD-PELV W/ CM
2 of 5 series · 16 of 46 positions shown, 18 images · IV contrast (APPLIED)
Comparison: CT abdomen [DATE]

CLINICAL DATA: Diverticulitis suspected. Sudden onset abdominal
pain.

EXAM:
CT ABDOMEN AND PELVIS WITH CONTRAST
TECHNIQUE: Multidetector CT imaging of the abdomen and pelvis was performed
using the standard protocol following bolus administration of
intravenous contrast.
CONTRAST:  100mL OMNIPAQUE IOHEXOL 300 MG/ML  SOLN

[Series 2: abd pel w · axial · 0.81mm/px · z∈[+686,+1111]mm · 13 of 97 slices shown, 15 images]
[im 6/97  soft-tissue]
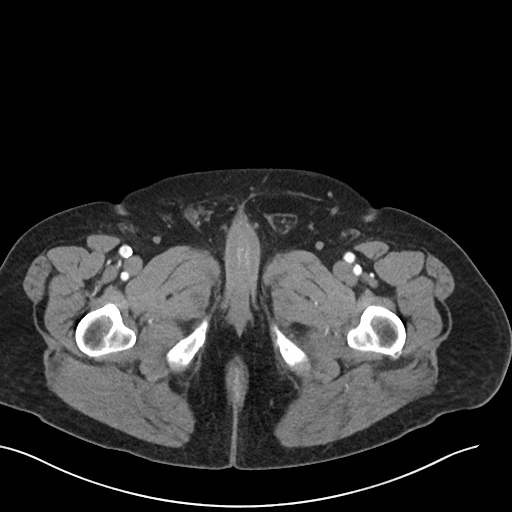
[im 6/97  bone]
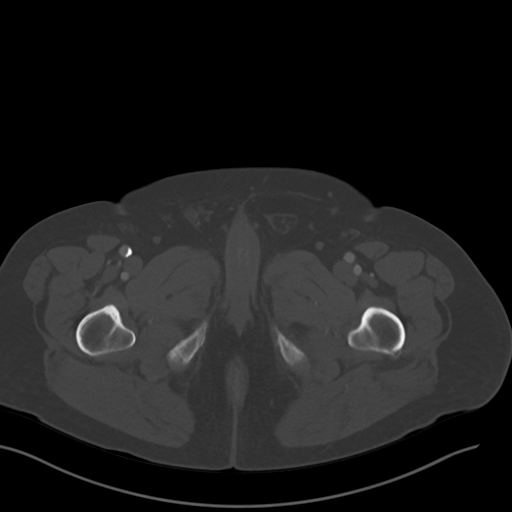
[im 16/97  soft-tissue]
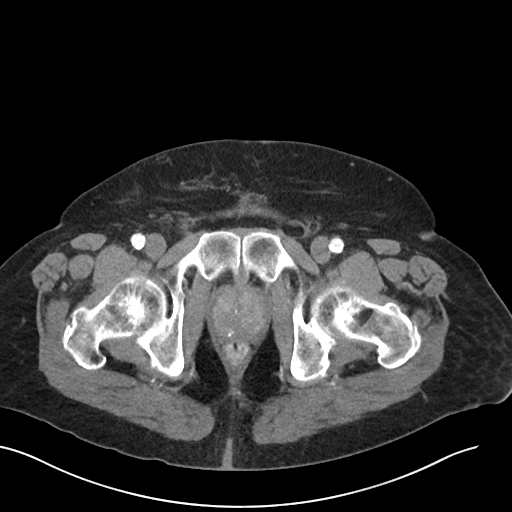
[im 21/97  soft-tissue]
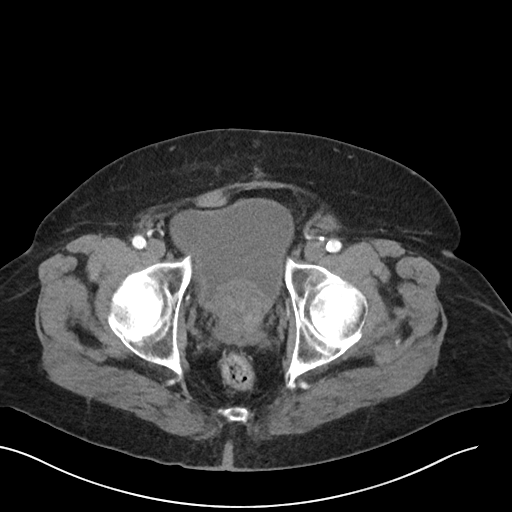
[im 26/97  soft-tissue]
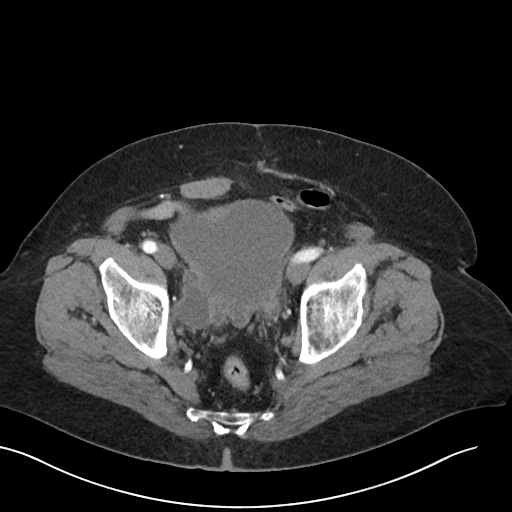
[im 36/97  soft-tissue]
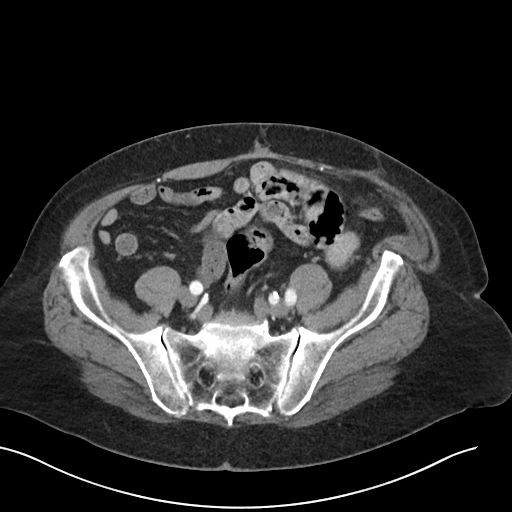
[im 41/97  soft-tissue]
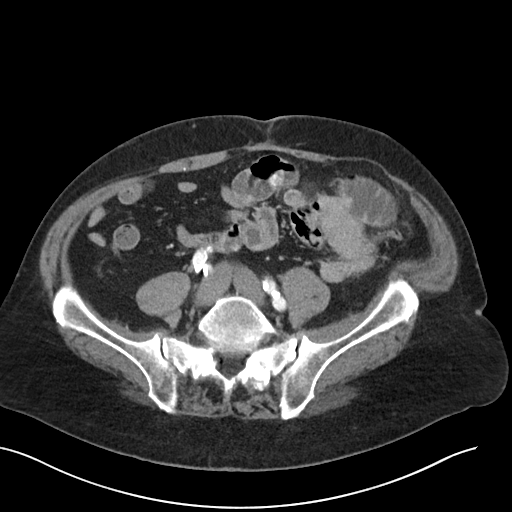
[im 51/97  soft-tissue]
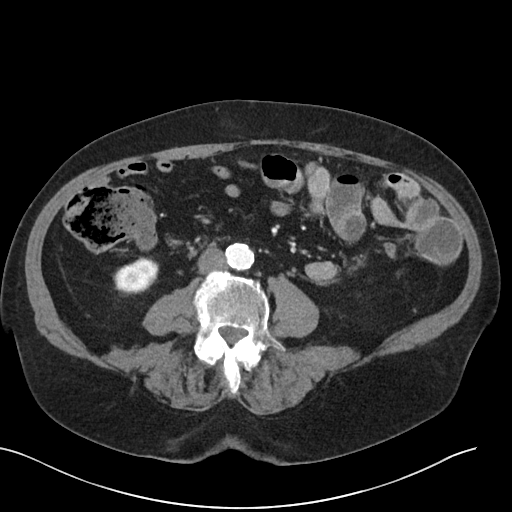
[im 56/97  soft-tissue]
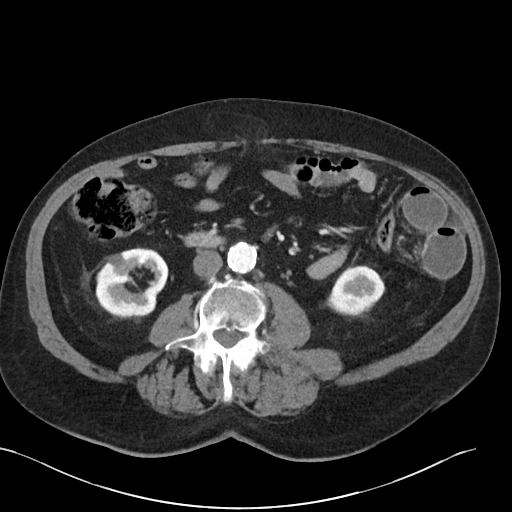
[im 61/97  soft-tissue]
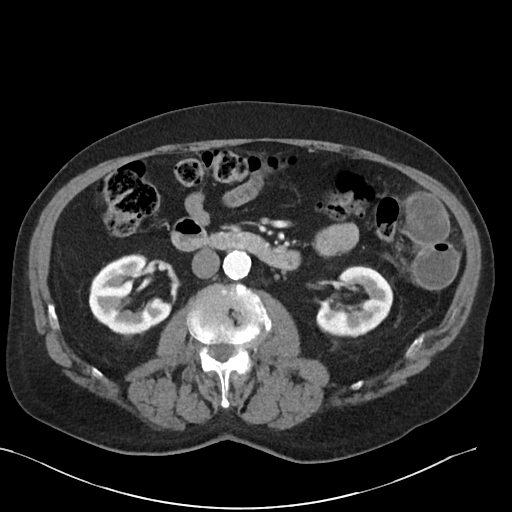
[im 61/97  bone]
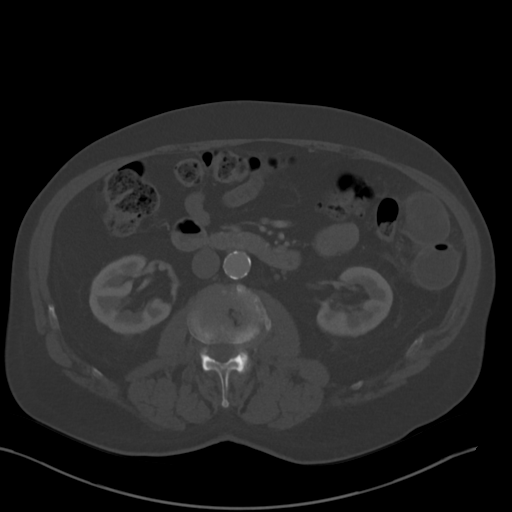
[im 71/97  soft-tissue]
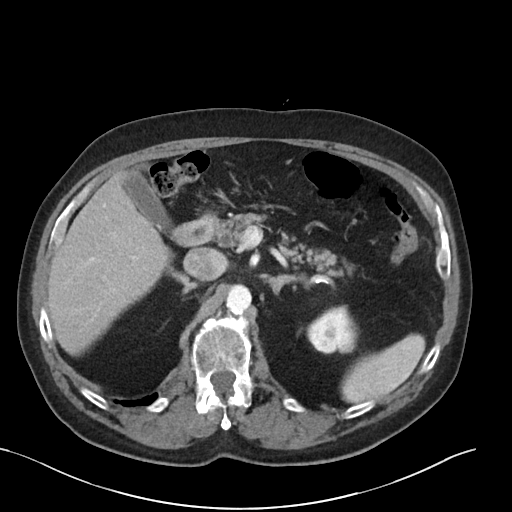
[im 76/97  soft-tissue]
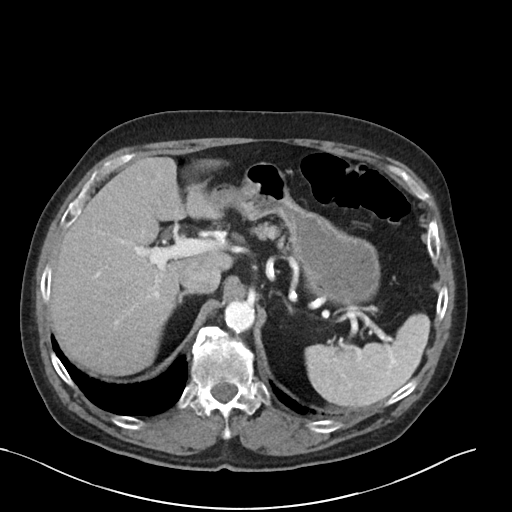
[im 81/97  soft-tissue]
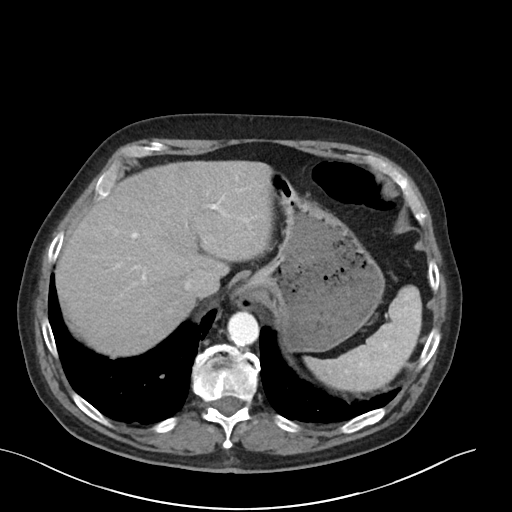
[im 91/97  soft-tissue]
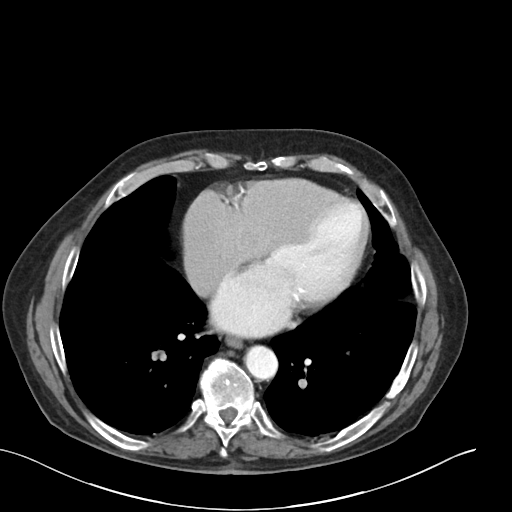

[Series 5: coronal · coronal · 0.83mm/px · 3 of 104 slices shown]
[im 35/104  soft-tissue]
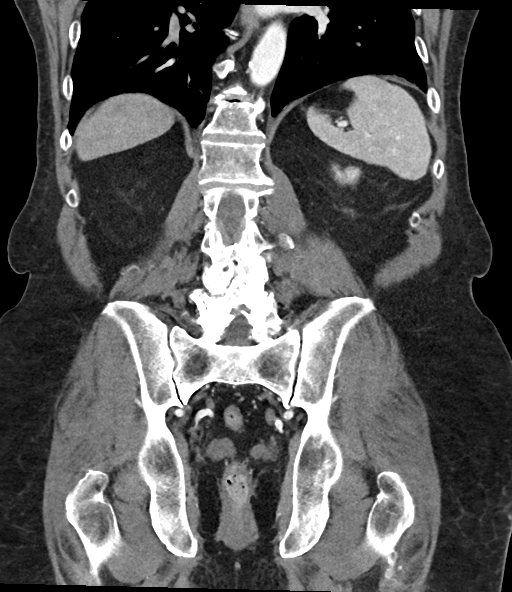
[im 46/104  soft-tissue]
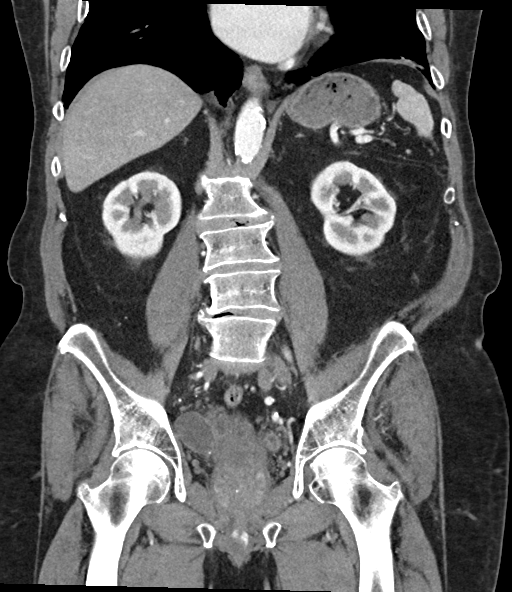
[im 58/104  soft-tissue]
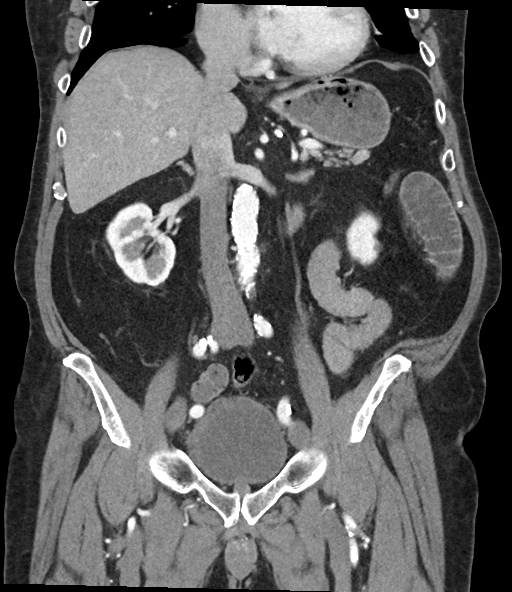

[16 of 46 positions shown; findings below may reference images not displayed]

FINDINGS: Lower chest: Lung bases are clear.

Hepatobiliary: No focal hepatic lesion. No biliary duct dilatation.
Common bile duct is normal.

Pancreas: Pancreas is normal. No ductal dilatation. No pancreatic
inflammation.

Spleen: Normal spleen

Adrenals/urinary tract: Adrenal glands and kidneys normal. Small
cortical lesion in the RIGHT kidney is not significant changed from

There are multiple diverticula of the bladder along the posterior
and anterior wall.

Stomach/Bowel: Small hiatal hernia.  Stomach and duodenum normal.

A loop of bowel in the LEFT upper quadrant is fluid-filled and has
mild inflammatory stranding adjacent to loops (image 39/2). There is
air-fluid level within these loops. The loops are mildly dilated up
to 3.5 cm (image 39/2.). The bowel upstream and downstream of this
dilated loop are small in caliber and the loop has a folded pattern
on sagittal imaging (118/6) which could indicate close loop
obstruction anatomy.

Downstream small bowel is normal.

Appendix normal. Ascending transverse colon normal. There are
scattered diverticula of the transverse colon. There is descending
colon is collapsed. No diverticulitis of the descending colon.
Rectosigmoid colon normal.

Vascular/Lymphatic: Abdominal aorta is normal caliber. No periportal
or retroperitoneal adenopathy. No pelvic adenopathy.

Reproductive:

Other: No free fluid.

Musculoskeletal: No aggressive osseous lesion.
IMPRESSION: 1. Inflamed loop of small bowel LEFT upper quadrant with potential
closed loop anatomy. This could progress to high-grade obstruction.
Recommend surgical consultation.
2. No diverticulitis.
3. Multiple bladder diverticula.

## 2020-08-28 MED ORDER — HYDROMORPHONE HCL 1 MG/ML IJ SOLN
0.5000 mg | Freq: Once | INTRAMUSCULAR | Status: AC
  Administered 2020-08-28: 0.5 mg via INTRAVENOUS
  Filled 2020-08-28: qty 1

## 2020-08-28 MED ORDER — PIPERACILLIN-TAZOBACTAM 3.375 G IVPB 30 MIN
3.3750 g | Freq: Once | INTRAVENOUS | Status: AC
  Administered 2020-08-28: 3.375 g via INTRAVENOUS
  Filled 2020-08-28: qty 50

## 2020-08-28 MED ORDER — IOHEXOL 300 MG/ML  SOLN
100.0000 mL | Freq: Once | INTRAMUSCULAR | Status: AC | PRN
  Administered 2020-08-28: 100 mL via INTRAVENOUS

## 2020-08-28 MED ORDER — LACTATED RINGERS IV BOLUS
500.0000 mL | Freq: Once | INTRAVENOUS | Status: AC
  Administered 2020-08-28: 500 mL via INTRAVENOUS

## 2020-08-28 MED ORDER — LACTATED RINGERS IV SOLN: INTRAVENOUS | Status: DC

## 2020-08-28 NOTE — ED Triage Notes (Signed)
  Patient comes in with abdominal pain that started around 1500.  Patient was sitting around and his stomach started aching.  Patient states it comes and goes.  Had a regular bowel movement earlier this morning, and no painful urination.  Patient states he takes his morning medications with buttermilk and was concerned that maybe his milk was spoiled.  No nausea, vomiting, or diarrhea.  Pain 6/10

## 2020-08-28 NOTE — ED Provider Notes (Signed)
Emergency Medicine Provider Triage Evaluation Note  Delvin Hedeen , a 80 y.o. male  was evaluated in triage.  Pt complains of fairly sudden onset of lower abdominal pain earlier today.  Cramping and aching quality.  No syncope or near syncope.  Normal bowel movements no difficulty urinating  Review of Systems  Positive: Lower abdominal pain Negative: No chest pain, no shortness of breath, no near syncope  Physical Exam  BP (!) 130/95 (BP Location: Left Arm)   Pulse 75   Temp 98.5 F (36.9 C) (Oral)   Resp 18   Ht 6' (1.829 m)   Wt 98.4 kg   SpO2 96%   BMI 29.43 kg/m  Gen:   Awake, no distress clinically well in appearance Resp:  Normal effort  MSK:   Moves extremities without difficulty, 2+ lower pitting edema Other:  Abdomen soft.  Moderate left lateral quadrant tenderness.  No palpable mass.  Medical Decision Making  Medically screening exam initiated at 7:44 PM.  Appropriate orders placed.  Reynolds Kittel was informed that the remainder of the evaluation will be completed by another provider, this initial triage assessment does not replace that evaluation, and the importance of remaining in the ED until their evaluation is complete.  Patient is clinically well in appearance.  No acute distress.  Vital signs are stable.  Fairly acute onset of left lower abdominal pain earlier today.  Minimal associated symptoms.  We will proceed with CT abdomen and abdominal pain work-up.   Charlesetta Shanks, MD 08/28/20 1946

## 2020-08-28 NOTE — ED Provider Notes (Addendum)
[facility] EMERGENCY DEPT Provider Note   CSN: [id number] Arrival date & time: 08/28/20  1843     History Chief Complaint  Patient presents with   Abdominal Pain    William Norman is a 80 y.o. male.  HPI Patient reports he felt all right in the morning.  He then started to get some pretty rapid onset of lower abdominal pain slightly to the left.  He denies he has been having any diarrhea or constipation.  He did not develop any vomiting.  No pain or burning with urination.  Over the course of the day pain is getting more intense.    Past Medical History:  Diagnosis Date   BPH (benign prostatic hypertrophy)    BPH associated with nocturia 08/29/2013   BPH with obstruction/lower urinary tract symptoms 08/14/2019   CAD (coronary artery disease)    CAD (coronary artery disease), native coronary artery    PTCA of RCA 1986, PTCA of LAD 1992,  Negative treadmill Cardiolite 2011    Chronic dermatitis of hands 10/12/2015   Chronic venous insufficiency    Coagulation disorder ([place]) 10/26/2018   DVT (deep venous thrombosis) (HCC)    Dyslipidemia    Eczema    Erectile dysfunction 07/11/2012   Essential hypertension    GERD 08/04/2006       GERD (gastroesophageal reflux disease)    Hyperglycemia 07/08/2019   Hyperlipidemia    Hypertension    Incomplete emptying of bladder 08/14/2019   Long term current use of anticoagulant therapy    Lumbar disc disease    Overweight (BMI 25.0-29.9) 06/18/2015   Persistent atrial fibrillation ([place]) 04/10/2018   Personal history of DVT (deep vein thrombosis)    Initially in 1998 with recurrence in 2002 now on chronic warfarin    Soft tissue lesion of foot 08/27/2015   Stroke [facility])    Urinary tract infection with hematuria 08/14/2019    Patient Active Problem List   Diagnosis Date Noted   SBO (small bowel obstruction) ([place]) 08/28/2020   AAA (abdominal aortic aneurysm) ([place]) 03/06/2020   Porokeratosis 02/25/2020    Stroke ([place]) 02/16/2020   Eczema    DVT (deep venous thrombosis) (HCC)    CAD (coronary artery disease)    Recurrent UTI 08/14/2019   Hyperglycemia 07/08/2019   Persistent atrial fibrillation ([place]) 04/10/2018   Chronic dermatitis of hands 10/12/2015   Soft tissue lesion of foot 08/27/2015   BPH associated with nocturia 08/29/2013   CAD (coronary artery disease), native coronary artery    Long term current use of anticoagulant therapy    Personal history of DVT (deep vein thrombosis)    Chronic venous insufficiency    Erectile dysfunction 07/11/2012   Lumbar disc disease    GERD 08/04/2006   Dyslipidemia    Essential hypertension     Past Surgical History:  Procedure Laterality Date   CARDIAC CATHETERIZATION     CARPAL TUNNEL RELEASE  04/21/2011   Procedure: CARPAL TUNNEL RELEASE;  Surgeon: Tennis Must, MD;  Location: [facility];  Service: Orthopedics;  Laterality: Left;   CARPAL TUNNEL RELEASE  05/23/2011   Procedure: CARPAL TUNNEL RELEASE;  Surgeon: Tennis Must, MD;  Location: [facility];  Service: Orthopedics;  Laterality: Right;   COLON SURGERY  2010 and 2011 colostomy reversal   CORONARY ANGIOPLASTY     EYE SURGERY     repair of macular hole   NASAL SEPTUM SURGERY     precutaneous transluminal coronary  angioplasty     right hernia     TONSILLECTOMY AND ADENOIDECTOMY         Family History  Problem Relation Age of Onset   Heart disease Father    Heart attack Father    Atrial fibrillation Father    Heart disease Mother    Atrial fibrillation Mother    Stroke Mother    AAA (abdominal aortic aneurysm) Brother     Social History   Tobacco Use   Smoking status: Former    Packs/day: 1.00    Years: 60.00    Pack years: 60.00    Types: Cigarettes    Quit date: 03/25/1998    Years since quitting: 22.4   Smokeless tobacco: Never  Vaping Use   Vaping Use: Never used  Substance Use Topics   Alcohol use: Yes    Alcohol/week: 2.0 -  3.0 standard drinks    Types: 2 - 3 Glasses of wine per week    Comment: 1 glass of red wine every 2-3 night    Drug use: No    Home Medications Prior to Admission medications   Medication Sig Start Date End Date Taking? Authorizing Provider  acetaminophen (TYLENOL) 650 MG CR tablet Take 650 mg by mouth as needed for pain.    [provider]  amLODipine (NORVASC) 5 MG tablet TAKE 1 TABLET BY MOUTH EVERY OTHER DAY 01/19/20   Vivi Barrack, MD  apixaban (ELIQUIS) 5 MG TABS tablet Take 1 tablet (5 mg total) by mouth 2 (two) times daily. 02/17/20   Edwin Dada, MD  atenolol (TENORMIN) 100 MG tablet TAKE 1 TABLET BY MOUTH EVERY DAY 08/07/20   Vivi Barrack, MD  atorvastatin (LIPITOR) 40 MG tablet TAKE 1/2 TABLET BY MOUTH DAILY 07/03/20   Vivi Barrack, MD  Coenzyme Q10 (COQ10) 200 MG CAPS Take 1 capsule by mouth daily. 07/23/20   Vivi Barrack, MD  Cyanocobalamin (VITAMIN B-12) 1000 MCG SUBL Place 1 tablet (1,000 mcg total) under the tongue daily. 07/24/20   Vivi Barrack, MD  hydroxypropyl methylcellulose / hypromellose (ISOPTO TEARS / GONIOVISC) 2.5 % ophthalmic solution Place 1 drop into both eyes 4 (four) times daily as needed for dry eyes.    [provider]  lisinopril (ZESTRIL) 40 MG tablet TAKE 1 TABLET BY MOUTH EVERY DAY 07/14/20   Vivi Barrack, MD  nitroGLYCERIN (NITROSTAT) 0.4 MG SL tablet Dissolve 1 tablet under the tongue every 5 minutes as needed 01/19/17   Vivi Barrack, MD  PFIZER-BIONT COVID-19 VAC-TRIS SUSP injection  05/20/20   [provider]  tamsulosin (FLOMAX) 0.4 MG CAPS capsule TAKE 1 CAPSULE (0.4 MG TOTAL) BY MOUTH IN THE MORNING AND AT BEDTIME. 06/02/20   Vivi Barrack, MD    Allergies    Other and Simvastatin  Review of Systems   Review of Systems 10 systems reviewed and negative except as per HPI Physical Exam Updated Vital Signs BP (!) 161/86   Pulse 65   Temp 98.5 F (36.9 C) (Oral)   Resp 18   Ht 6' (1.829 m)    Wt 98.4 kg   SpO2 90%   BMI 29.43 kg/m   Physical Exam Constitutional:      Comments: Alert nontoxic.  Clear mental status.  No respiratory distress.  HENT:     Head: Normocephalic and atraumatic.     Mouth/Throat:     Pharynx: Oropharynx is clear.  Cardiovascular:     Rate  and Rhythm: Normal rate and regular rhythm.  Pulmonary:     Effort: Pulmonary effort is normal.     Breath sounds: Normal breath sounds.  Abdominal:     Comments: Abdomen soft.  Moderate left lateral quadrant tenderness to palpation.  Musculoskeletal:     Comments: 2+ pitting edema bilateral lower extremities.  Skin:    General: Skin is warm and dry.  Neurological:     General: No focal deficit present.     Mental Status: He is oriented to person, place, and time.     Coordination: Coordination normal.    ED Results / Procedures / Treatments   Labs (all labs ordered are listed, but only abnormal results are displayed) Labs Reviewed  URINALYSIS, ROUTINE W REFLEX MICROSCOPIC - Abnormal; Notable for the following components:      Result Value   APPearance HAZY (*)    Hgb urine dipstick LARGE (*)    Protein, ur TRACE (*)    Nitrite POSITIVE (*)    Leukocytes,Ua LARGE (*)    WBC, UA >50 (*)    Bacteria, UA MANY (*)    All other components within normal limits  CULTURE, BLOOD (ROUTINE X 2)  CULTURE, BLOOD (ROUTINE X 2)  URINE CULTURE  RESP PANEL BY RT-PCR (FLU A&B, COVID) ARPGX2  LIPASE, BLOOD  COMPREHENSIVE METABOLIC PANEL  CBC  LACTIC ACID, PLASMA  LACTIC ACID, PLASMA    EKG None  Radiology CT Abdomen Pelvis W Contrast  Result Date: 08/28/2020 CLINICAL DATA:  Diverticulitis suspected. Sudden onset abdominal pain. EXAM: CT ABDOMEN AND PELVIS WITH CONTRAST TECHNIQUE: Multidetector CT imaging of the abdomen and pelvis was performed using the standard protocol following bolus administration of intravenous contrast. CONTRAST:  138mL OMNIPAQUE IOHEXOL 300 MG/ML  SOLN COMPARISON:  CT abdomen  04/01/14 FINDINGS: Lower chest: Lung bases are clear. Hepatobiliary: No focal hepatic lesion. No biliary duct dilatation. Common bile duct is normal. Pancreas: Pancreas is normal. No ductal dilatation. No pancreatic inflammation. Spleen: Normal spleen Adrenals/urinary tract: Adrenal glands and kidneys normal. Small cortical lesion in the RIGHT kidney is not significant changed from CT 2016. There are multiple diverticula of the bladder along the posterior and anterior wall. Stomach/Bowel: Small hiatal hernia.  Stomach and duodenum normal. A loop of bowel in the LEFT upper quadrant is fluid-filled and has mild inflammatory stranding adjacent to loops (image 39/2). There is air-fluid level within these loops. The loops are mildly dilated up to 3.5 cm (image 39/2.). The bowel upstream and downstream of this dilated loop are small in caliber and the loop has a folded pattern on sagittal imaging (118/6) which could indicate close loop obstruction anatomy. Downstream small bowel is normal. Appendix normal. Ascending transverse colon normal. There are scattered diverticula of the transverse colon. There is descending colon is collapsed. No diverticulitis of the descending colon. Rectosigmoid colon normal. Vascular/Lymphatic: Abdominal aorta is normal caliber. No periportal or retroperitoneal adenopathy. No pelvic adenopathy. Reproductive: Other: No free fluid. Musculoskeletal: No aggressive osseous lesion. IMPRESSION: 1. Inflamed loop of small bowel LEFT upper quadrant with potential closed loop anatomy. This could progress to high-grade obstruction. Recommend surgical consultation. 2. No diverticulitis. 3. Multiple bladder diverticula. Electronically Signed   By: Suzy Bouchard M.D.   On: 08/28/2020 21:07    Procedures Procedures   Medications Ordered in ED Medications  lactated ringers infusion ( Intravenous New Bag/Given 08/28/20 2319)  iohexol (OMNIPAQUE) 300 MG/ML solution 100 mL (100 mLs Intravenous Contrast  Given 08/28/20 2040)  HYDROmorphone (DILAUDID)  injection 0.5 mg (0.5 mg Intravenous Given 08/28/20 2254)  lactated ringers bolus 500 mL (500 mLs Intravenous New Bag/Given 08/28/20 2257)  piperacillin-tazobactam (ZOSYN) IVPB 3.375 g (0 g Intravenous Stopped 08/28/20 2342)    ED Course  I have reviewed the triage vital signs and the nursing notes.  Pertinent labs & imaging results that were available during my care of the patient were reviewed by me and considered in my medical decision making (see chart for details).  Clinical Course as of 08/29/20 0013  Fri Aug 28, 2020  2311 Consult: Reviewed with general surgery.  Dr. Redmond Pulling.  Has reviewed CT scan and does not think this represents a closed-loop obstruction at this time.  Recommends admission to hospitalist service with consultation to general surgery. [MP]  2327 Consult:Dr. Choitner for admission [MP]    Clinical Course User Index [MP] Charlesetta Shanks, MD   MDM Rules/Calculators/A&P                         ,Patient presents as outlined.  He had fairly cute onset of abdominal pain.  He is nontoxic.  Mental status clear.  He is at this point has not had vomiting or constipation.  No urinary symptoms.  CT scan shows partial obstruction with some concern for developing closed-loop obstruction.  This is reviewed with Dr. Redmond Pulling.  At this time he does not feel this represents closed-loop obstruction plan will be for admission to hospitalist service and consultation to general surgery.  Patient does have a grossly positive urinalysis.  Will start the patient on zosyn.  Patient's pain had an escalating pattern.  He is getting Dilaudid as needed for pain control.  Blood pressure and heart rate stable without signs of SIRS at this time.  Final Clinical Impression(s) / ED Diagnoses Final diagnoses:  Intestinal obstruction, unspecified cause, unspecified whether partial or complete ([place])  Urinary tract infection with hematuria, site unspecified    Rx  / DC Orders ED Discharge Orders     None        Charlesetta Shanks, MD 08/29/20 Sheila Oats    Charlesetta Shanks, MD 08/29/20 [phone]

## 2020-08-29 ENCOUNTER — Inpatient Hospital Stay (HOSPITAL_COMMUNITY): Payer: Medicare HMO

## 2020-08-29 ENCOUNTER — Encounter (HOSPITAL_COMMUNITY): Payer: Self-pay | Admitting: Family Medicine

## 2020-08-29 DIAGNOSIS — M47816 Spondylosis without myelopathy or radiculopathy, lumbar region: Secondary | ICD-10-CM | POA: Diagnosis not present

## 2020-08-29 DIAGNOSIS — K219 Gastro-esophageal reflux disease without esophagitis: Secondary | ICD-10-CM | POA: Diagnosis not present

## 2020-08-29 DIAGNOSIS — T148XXA Other injury of unspecified body region, initial encounter: Secondary | ICD-10-CM | POA: Diagnosis not present

## 2020-08-29 DIAGNOSIS — R109 Unspecified abdominal pain: Secondary | ICD-10-CM | POA: Diagnosis not present

## 2020-08-29 DIAGNOSIS — Z87891 Personal history of nicotine dependence: Secondary | ICD-10-CM | POA: Diagnosis not present

## 2020-08-29 DIAGNOSIS — R5381 Other malaise: Secondary | ICD-10-CM | POA: Diagnosis present

## 2020-08-29 DIAGNOSIS — K660 Peritoneal adhesions (postprocedural) (postinfection): Secondary | ICD-10-CM | POA: Diagnosis not present

## 2020-08-29 DIAGNOSIS — E876 Hypokalemia: Secondary | ICD-10-CM | POA: Diagnosis present

## 2020-08-29 DIAGNOSIS — N401 Enlarged prostate with lower urinary tract symptoms: Secondary | ICD-10-CM | POA: Diagnosis present

## 2020-08-29 DIAGNOSIS — J9811 Atelectasis: Secondary | ICD-10-CM | POA: Diagnosis not present

## 2020-08-29 DIAGNOSIS — K5939 Other megacolon: Secondary | ICD-10-CM | POA: Diagnosis not present

## 2020-08-29 DIAGNOSIS — Z7901 Long term (current) use of anticoagulants: Secondary | ICD-10-CM | POA: Diagnosis not present

## 2020-08-29 DIAGNOSIS — K5651 Intestinal adhesions [bands], with partial obstruction: Secondary | ICD-10-CM | POA: Diagnosis not present

## 2020-08-29 DIAGNOSIS — K55019 Acute (reversible) ischemia of small intestine, extent unspecified: Secondary | ICD-10-CM | POA: Diagnosis not present

## 2020-08-29 DIAGNOSIS — N390 Urinary tract infection, site not specified: Secondary | ICD-10-CM | POA: Diagnosis not present

## 2020-08-29 DIAGNOSIS — K56609 Unspecified intestinal obstruction, unspecified as to partial versus complete obstruction: Secondary | ICD-10-CM | POA: Diagnosis not present

## 2020-08-29 DIAGNOSIS — E43 Unspecified severe protein-calorie malnutrition: Secondary | ICD-10-CM | POA: Diagnosis not present

## 2020-08-29 DIAGNOSIS — I480 Paroxysmal atrial fibrillation: Secondary | ICD-10-CM | POA: Diagnosis not present

## 2020-08-29 DIAGNOSIS — Y836 Removal of other organ (partial) (total) as the cause of abnormal reaction of the patient, or of later complication, without mention of misadventure at the time of the procedure: Secondary | ICD-10-CM | POA: Diagnosis not present

## 2020-08-29 DIAGNOSIS — Z9861 Coronary angioplasty status: Secondary | ICD-10-CM | POA: Diagnosis not present

## 2020-08-29 DIAGNOSIS — I4819 Other persistent atrial fibrillation: Secondary | ICD-10-CM | POA: Diagnosis not present

## 2020-08-29 DIAGNOSIS — T8143XA Infection following a procedure, organ and space surgical site, initial encounter: Secondary | ICD-10-CM | POA: Diagnosis not present

## 2020-08-29 DIAGNOSIS — R339 Retention of urine, unspecified: Secondary | ICD-10-CM | POA: Diagnosis not present

## 2020-08-29 DIAGNOSIS — I10 Essential (primary) hypertension: Secondary | ICD-10-CM | POA: Diagnosis not present

## 2020-08-29 DIAGNOSIS — Z8673 Personal history of transient ischemic attack (TIA), and cerebral infarction without residual deficits: Secondary | ICD-10-CM | POA: Diagnosis not present

## 2020-08-29 DIAGNOSIS — I2510 Atherosclerotic heart disease of native coronary artery without angina pectoris: Secondary | ICD-10-CM | POA: Diagnosis not present

## 2020-08-29 DIAGNOSIS — Z4682 Encounter for fitting and adjustment of non-vascular catheter: Secondary | ICD-10-CM | POA: Diagnosis not present

## 2020-08-29 DIAGNOSIS — Z86718 Personal history of other venous thrombosis and embolism: Secondary | ICD-10-CM | POA: Diagnosis not present

## 2020-08-29 DIAGNOSIS — M160 Bilateral primary osteoarthritis of hip: Secondary | ICD-10-CM | POA: Diagnosis not present

## 2020-08-29 DIAGNOSIS — K6389 Other specified diseases of intestine: Secondary | ICD-10-CM | POA: Diagnosis not present

## 2020-08-29 DIAGNOSIS — E785 Hyperlipidemia, unspecified: Secondary | ICD-10-CM | POA: Diagnosis not present

## 2020-08-29 DIAGNOSIS — Z20822 Contact with and (suspected) exposure to covid-19: Secondary | ICD-10-CM | POA: Diagnosis not present

## 2020-08-29 DIAGNOSIS — Z6828 Body mass index (BMI) 28.0-28.9, adult: Secondary | ICD-10-CM | POA: Diagnosis not present

## 2020-08-29 DIAGNOSIS — L7632 Postprocedural hematoma of skin and subcutaneous tissue following other procedure: Secondary | ICD-10-CM | POA: Diagnosis not present

## 2020-08-29 DIAGNOSIS — K56600 Partial intestinal obstruction, unspecified as to cause: Secondary | ICD-10-CM | POA: Diagnosis not present

## 2020-08-29 DIAGNOSIS — I639 Cerebral infarction, unspecified: Secondary | ICD-10-CM | POA: Diagnosis not present

## 2020-08-29 LAB — CBC
HCT: 47.1 % (ref 39.0–52.0)
Hemoglobin: 15.4 g/dL (ref 13.0–17.0)
MCH: 29.6 pg (ref 26.0–34.0)
MCHC: 32.7 g/dL (ref 30.0–36.0)
MCV: 90.6 fL (ref 80.0–100.0)
Platelets: 181 10*3/uL (ref 150–400)
RBC: 5.2 MIL/uL (ref 4.22–5.81)
RDW: 13.8 % (ref 11.5–15.5)
WBC: 10.6 10*3/uL — ABNORMAL HIGH (ref 4.0–10.5)
nRBC: 0 % (ref 0.0–0.2)

## 2020-08-29 LAB — RESP PANEL BY RT-PCR (FLU A&B, COVID) ARPGX2
Influenza A by PCR: NEGATIVE
Influenza B by PCR: NEGATIVE
SARS Coronavirus 2 by RT PCR: NEGATIVE

## 2020-08-29 LAB — BASIC METABOLIC PANEL
Anion gap: 7 (ref 5–15)
BUN: 16 mg/dL (ref 8–23)
CO2: 25 mmol/L (ref 22–32)
Calcium: 9.3 mg/dL (ref 8.9–10.3)
Chloride: 106 mmol/L (ref 98–111)
Creatinine, Ser: 0.85 mg/dL (ref 0.61–1.24)
GFR, Estimated: >60 mL/min (ref >60)
Glucose, Bld: 122 mg/dL — ABNORMAL HIGH (ref 70–99)
Potassium: 4.3 mmol/L (ref 3.5–5.1)
Sodium: 138 mmol/L (ref 135–145)

## 2020-08-29 LAB — LACTIC ACID, PLASMA
Lactic Acid, Venous: 1.9 mmol/L (ref 0.5–1.9)
Lactic Acid, Venous: 1.7 mmol/L (ref 0.5–1.9)

## 2020-08-29 IMAGING — DX DG ABD PORTABLE 1V
3 series · 3 of 3 positions shown · non-contrast
Comparison: [DATE]

CLINICAL DATA: Small-bowel obstruction

EXAM:
PORTABLE ABDOMEN - 1 VIEW

[abdomen supine (1 of 3)]
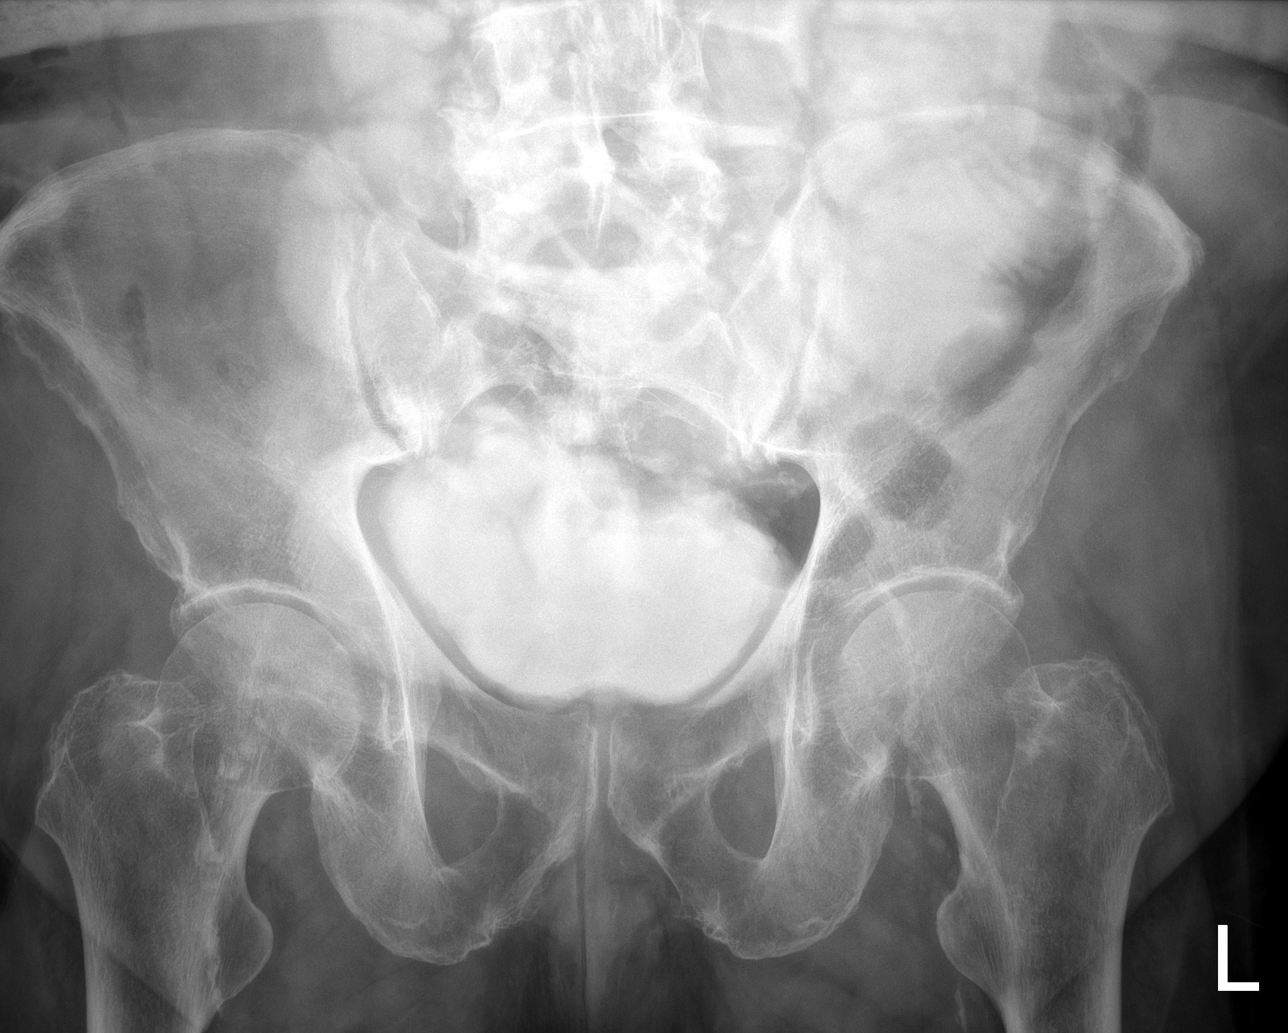

[abdomen supine (2 of 3)]
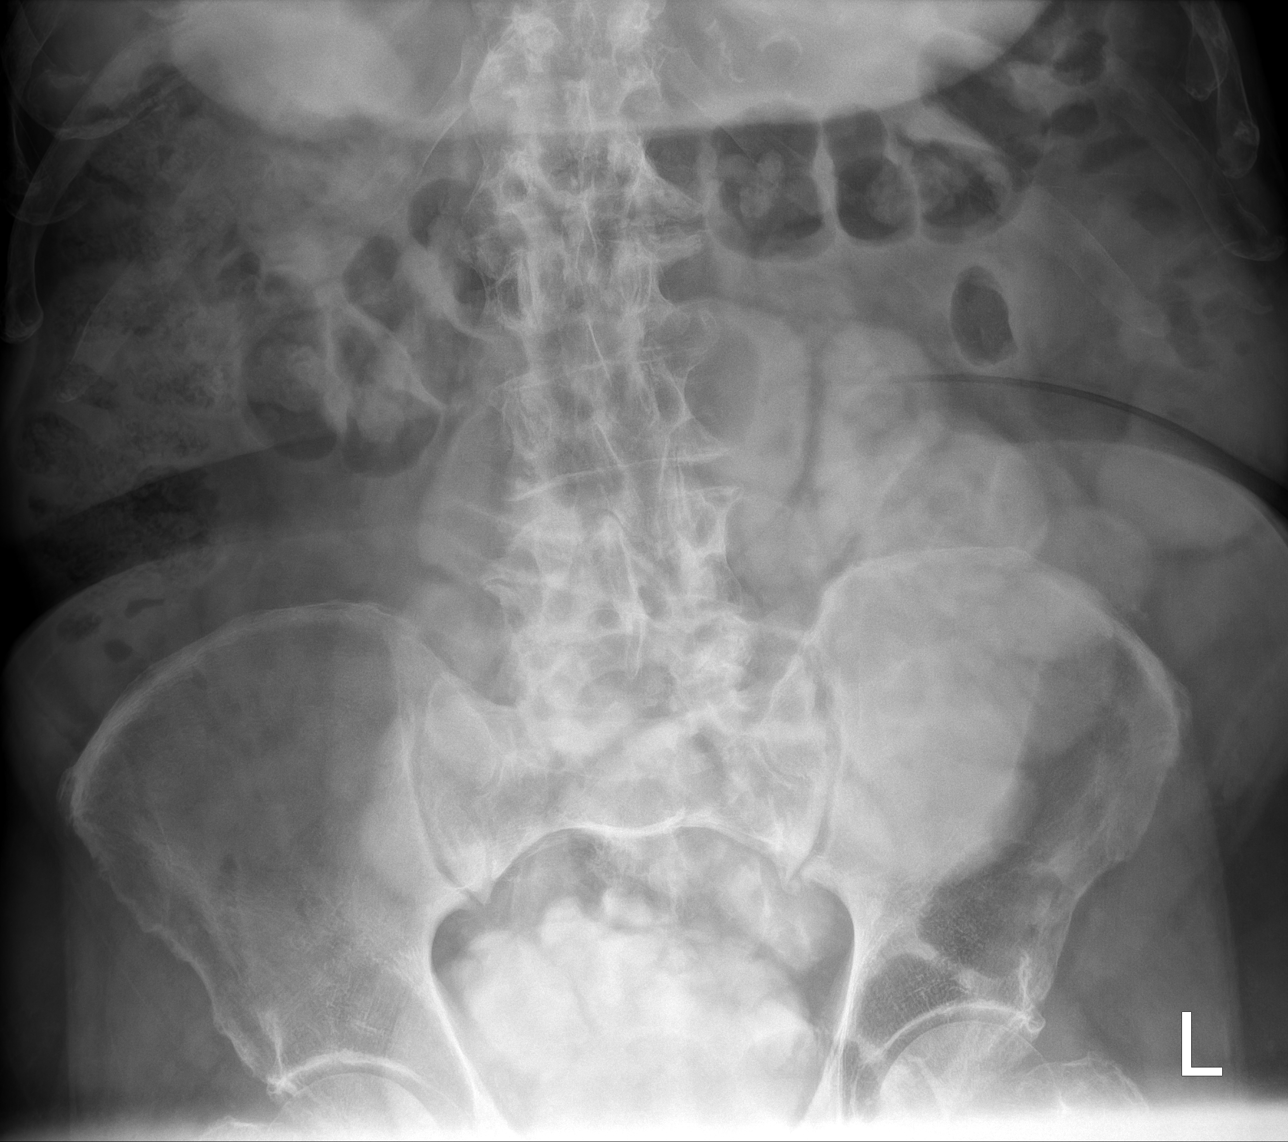

[abdomen supine (3 of 3)]
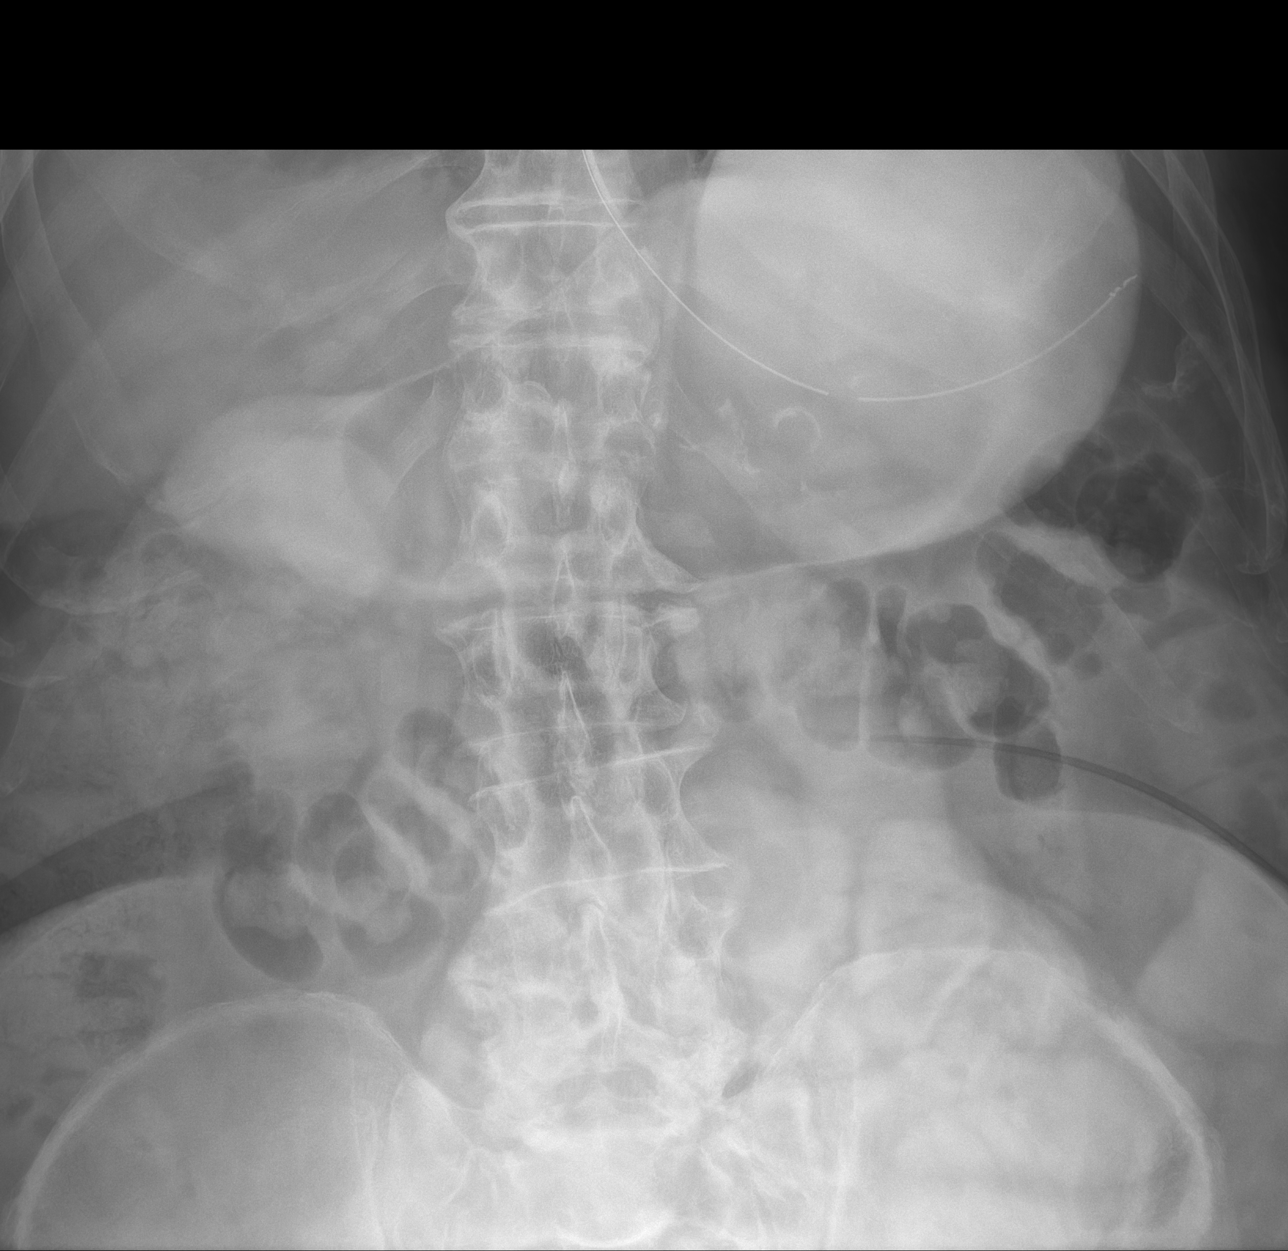

[3 of 3 positions shown; findings below may reference images not displayed]

FINDINGS: Three supine frontal views of the abdomen and pelvis are obtained
after the administration of Gastroview approximately 10 hours prior
to imaging. Enteric catheter is seen within the gastric lumen. There
is significant retention oral contrast within the stomach. Slow
diffusion of oral contrast is seen filling dilated loops of small
bowel within the lower central abdomen and pelvis. No evidence of
transit of contrast into the colon at the time of imaging.

Excreted contrast is seen within the urinary bladder.
IMPRESSION: 1. Dilute oral contrast filling multiple dilated loops of small
bowel throughout the lower abdomen and pelvis, compatible with
small-bowel obstruction. No contrast identified within the colon at
the time of imaging.
2. Significant retention of oral contrast within the stomach, with
indwelling enteric catheter as above.

## 2020-08-29 IMAGING — DX DG ABD PORTABLE 1V
1 series · 1 of 1 positions shown · non-contrast
Comparison: CT Abdomen and Pelvis [DATE].

CLINICAL DATA: 79-year-old male NG tube placement.

EXAM:
PORTABLE ABDOMEN - 1 VIEW

[abdomen supine]
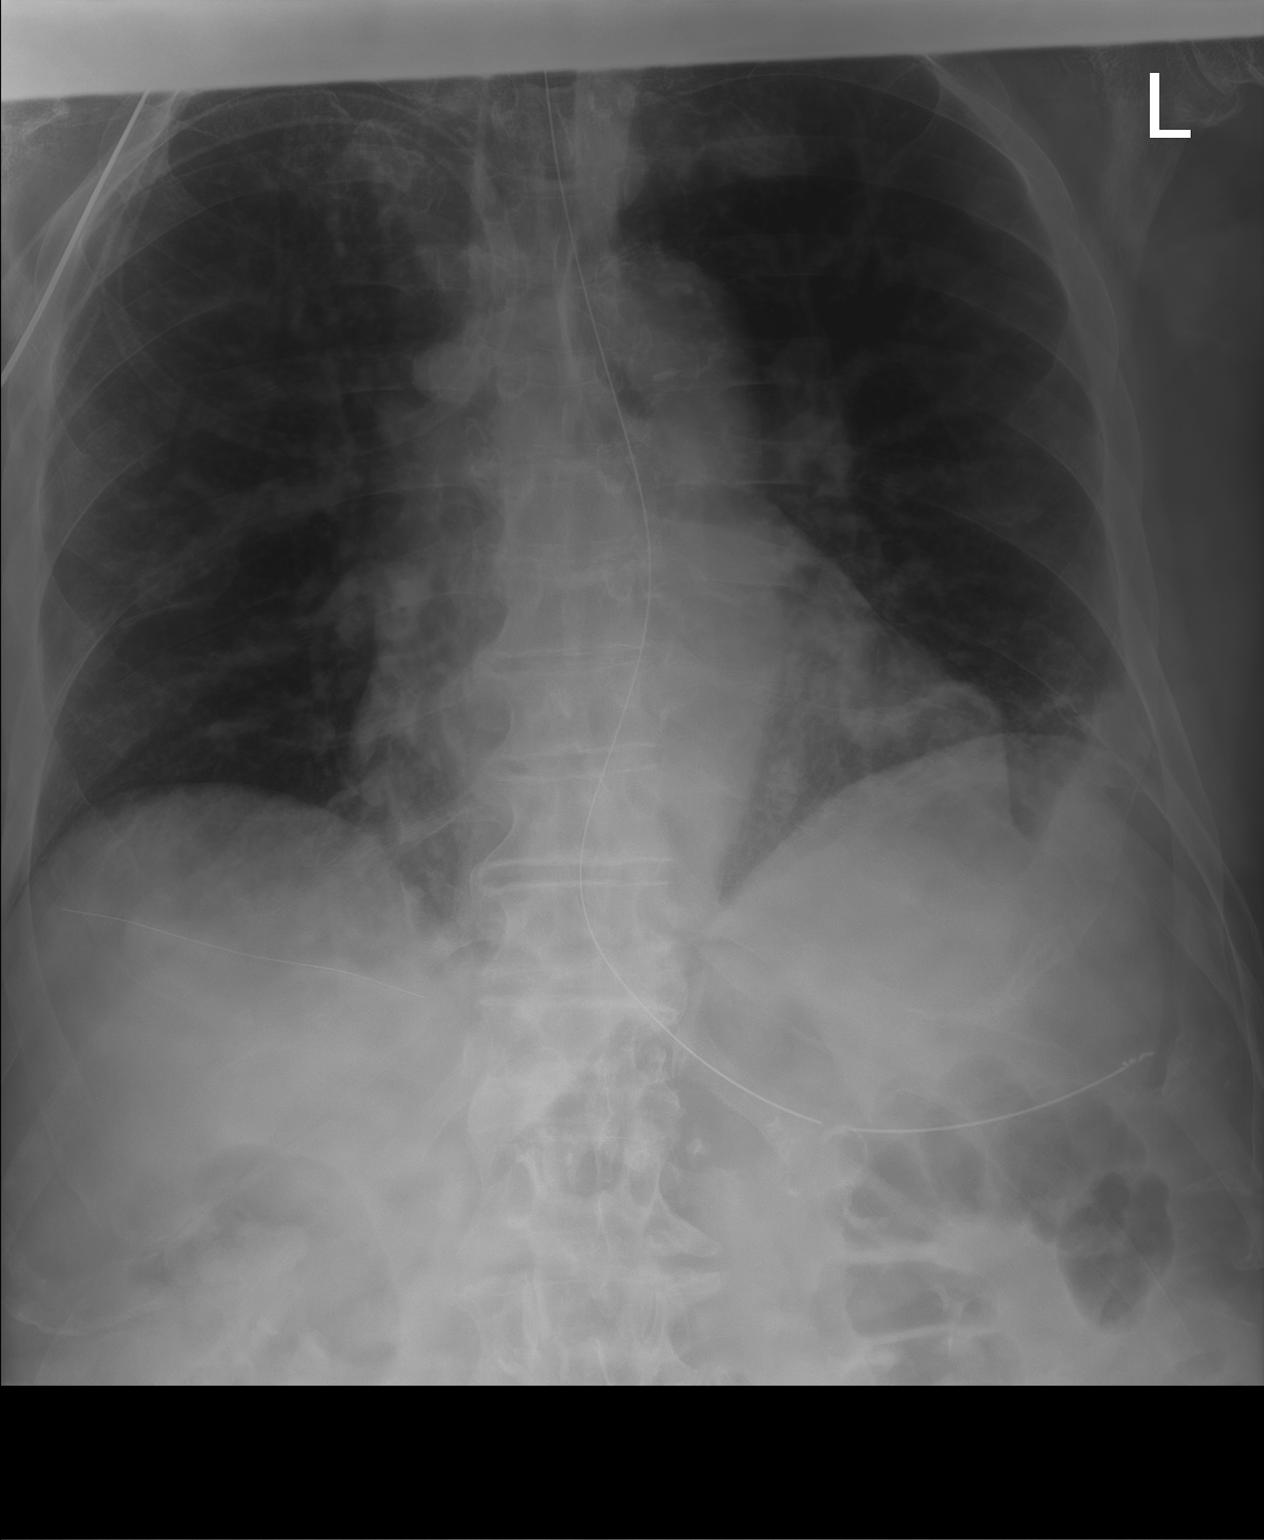

[1 of 1 positions shown; findings below may reference images not displayed]

FINDINGS: Portable AP supine view at [B7] hours. Enteric tube placed into the
stomach. Side hole is at the level of the gastric body. Stable
visible bowel gas pattern from the recent CT. No acute
cardiopulmonary abnormality identified.
IMPRESSION: NG tube placed into the stomach, side hole at the gastric body.

## 2020-08-29 MED ORDER — FENTANYL CITRATE (PF) 100 MCG/2ML IJ SOLN
12.5000 ug | INTRAMUSCULAR | Status: DC | PRN
Start: 2020-08-29 — End: 2020-08-29
  Administered 2020-08-29 (×3): 25 ug via INTRAVENOUS
  Filled 2020-08-29 (×3): qty 2

## 2020-08-29 MED ORDER — SODIUM CHLORIDE 0.9 % IV SOLN
1.0000 g | INTRAVENOUS | Status: AC
  Administered 2020-08-29 – 2020-08-31 (×3): 1 g via INTRAVENOUS
  Filled 2020-08-29 (×3): qty 1

## 2020-08-29 MED ORDER — METOPROLOL TARTRATE 5 MG/5ML IV SOLN
5.0000 mg | INTRAVENOUS | Status: DC | PRN
  Administered 2020-09-04 – 2020-09-08 (×4): 5 mg via INTRAVENOUS
  Filled 2020-08-29 (×4): qty 5

## 2020-08-29 MED ORDER — ONDANSETRON HCL 4 MG PO TABS: 4.0000 mg | ORAL_TABLET | Freq: Four times a day (QID) | ORAL | Status: DC | PRN

## 2020-08-29 MED ORDER — LABETALOL HCL 5 MG/ML IV SOLN: 10.0000 mg | INTRAVENOUS | Status: DC | PRN

## 2020-08-29 MED ORDER — DIATRIZOATE MEGLUMINE & SODIUM 66-10 % PO SOLN
90.0000 mL | Freq: Once | ORAL | Status: AC
Start: 2020-08-29 — End: 2020-08-29
  Administered 2020-08-29: 90 mL via NASOGASTRIC
  Filled 2020-08-29: qty 90

## 2020-08-29 MED ORDER — LACTATED RINGERS IV SOLN: INTRAVENOUS | Status: AC

## 2020-08-29 MED ORDER — ONDANSETRON HCL 4 MG/2ML IJ SOLN
4.0000 mg | Freq: Four times a day (QID) | INTRAMUSCULAR | Status: DC | PRN
  Administered 2020-08-29 – 2020-09-16 (×3): 4 mg via INTRAVENOUS
  Filled 2020-08-29 (×3): qty 2

## 2020-08-29 MED ORDER — FENTANYL CITRATE (PF) 100 MCG/2ML IJ SOLN
12.5000 ug | INTRAMUSCULAR | Status: DC | PRN
  Administered 2020-08-29: 25 ug via INTRAVENOUS
  Filled 2020-08-29: qty 2

## 2020-08-29 MED ORDER — HEPARIN (PORCINE) 25000 UT/250ML-% IV SOLN
1350.0000 [IU]/h | INTRAVENOUS | Status: DC
  Administered 2020-08-29 – 2020-08-31 (×4): 1350 [IU]/h via INTRAVENOUS
  Filled 2020-08-29 (×5): qty 250

## 2020-08-29 MED ORDER — HYDROMORPHONE HCL 1 MG/ML IJ SOLN
0.5000 mg | INTRAMUSCULAR | Status: DC | PRN
  Administered 2020-08-29 – 2020-08-31 (×10): 0.5 mg via INTRAVENOUS
  Filled 2020-08-29 (×10): qty 1

## 2020-08-29 NOTE — ED Notes (Signed)
Called [facility] to transport patient to [facility]M Rm 3

## 2020-08-29 NOTE — Consult Note (Signed)
CC: Abdominal pain  Requesting provider: Dr. Vallery Ridge  HPI: William Norman is an 80 y.o. male who went to [facility] yesterday because of gradual onset of left-sided abdominal pain.  The pain would come in waves and would get worse at times.  He had no nausea or vomiting fever or chills.  Last bowel movement was Friday.  In the emergency room there he was found to have a urinary tract infection and a CT scan was concerning for a potential closed-loop bowel obstruction potentially.  He had mildly thickened small bowel loop in the left upper quadrant with some small bowel proximal dilatation.  He has a remote history of perforated sigmoid diverticulitis and an incarcerated left inguinal hernia for which he underwent urgent sigmoid colectomy with colostomy and and repair of a incarcerated left indirect inguinal hernia by Dr. Dalbert Batman in 2010.  He subsequently underwent reversal in 2011 with takedown and closure of colostomy, lysis of adhesions and repair of parastomal hernia with Stratus mesh.  He had a medically managed small bowel obstruction in 2016.  Comorbidities include coronary artery disease, remote history of DVT, CVA in January 2022 on Eliquis, BPH, hypertension, hyperlipidemia  States he does not really have any deficits from his stroke other than perhaps a tremor on his right side but does not know if that is related to the stroke or not.  Lives with his wife and daughter  Past Medical History:  Diagnosis Date   BPH (benign prostatic hypertrophy)    BPH associated with nocturia 08/29/2013   BPH with obstruction/lower urinary tract symptoms 08/14/2019   CAD (coronary artery disease)    CAD (coronary artery disease), native coronary artery    PTCA of RCA 1986, PTCA of LAD 1992,  Negative treadmill Cardiolite 2011    Chronic dermatitis of hands 10/12/2015   Chronic venous insufficiency    Coagulation disorder ([place]) 10/26/2018   DVT (deep venous thrombosis) ([place])     Dyslipidemia    Eczema    Erectile dysfunction 07/11/2012   Essential hypertension    GERD 08/04/2006       GERD (gastroesophageal reflux disease)    Hyperglycemia 07/08/2019   Hyperlipidemia    Hypertension    Incomplete emptying of bladder 08/14/2019   Long term current use of anticoagulant therapy    Lumbar disc disease    Overweight (BMI 25.0-29.9) 06/18/2015   Persistent atrial fibrillation ([place]) 04/10/2018   Personal history of DVT (deep vein thrombosis)    Initially in 1998 with recurrence in 2002 now on chronic warfarin    Soft tissue lesion of foot 08/27/2015   Stroke [facility])    Urinary tract infection with hematuria 08/14/2019    Past Surgical History:  Procedure Laterality Date   CARDIAC CATHETERIZATION     CARPAL TUNNEL RELEASE  04/21/2011   Procedure: CARPAL TUNNEL RELEASE;  Surgeon: Tennis Must, MD;  Location: [facility];  Service: Orthopedics;  Laterality: Left;   CARPAL TUNNEL RELEASE  05/23/2011   Procedure: CARPAL TUNNEL RELEASE;  Surgeon: Tennis Must, MD;  Location: [facility];  Service: Orthopedics;  Laterality: Right;   COLON SURGERY  2010 and 2011 colostomy reversal   CORONARY ANGIOPLASTY     EYE SURGERY     repair of macular hole   NASAL SEPTUM SURGERY     precutaneous transluminal coronary angioplasty     right hernia     TONSILLECTOMY AND ADENOIDECTOMY      Family  History  Problem Relation Age of Onset   Heart disease Father    Heart attack Father    Atrial fibrillation Father    Heart disease Mother    Atrial fibrillation Mother    Stroke Mother    AAA (abdominal aortic aneurysm) Brother     Social:  reports that he quit smoking about 22 years ago. His smoking use included cigarettes. He has a 60.00 pack-year smoking history. He has never used smokeless tobacco. He reports current alcohol use of about 2.0 - 3.0 standard drinks of alcohol per week. He reports that he does not use drugs.  Allergies:  Allergies   Allergen Reactions   Other Other (See Comments)    Some form of numbing medication used by dentist, almost died   Simvastatin Other (See Comments)    Muscle aches     Medications: I have reviewed the patient's current medications.   ROS - all of the below systems have been reviewed with the patient and positives are indicated with bold text General: chills, fever or night sweats Eyes: blurry vision or double vision ENT: epistaxis or sore throat Allergy/Immunology: itchy/watery eyes or nasal congestion Hematologic/Lymphatic: bleeding problems, blood clots or swollen lymph nodes Endocrine: temperature intolerance or unexpected weight changes Breast: new or changing breast lumps or nipple discharge Resp: cough, shortness of breath, or wheezing CV: chest pain or dyspnea on exertion GI: as per HPI GU: dysuria, trouble voiding, or hematuria MSK: joint pain or joint stiffness Neuro: TIA or stroke symptoms Derm: pruritus and skin lesion changes Psych: anxiety and depression  PE Blood pressure (!) 160/92, pulse 90, temperature 98.7 F (37.1 C), temperature source Oral, resp. rate 17, height 6' (1.829 m), weight 97.1 kg, SpO2 93 %. Constitutional: NAD; conversant; no deformities Eyes: Moist conjunctiva; no lid lag; anicteric; PERRL Neck: Trachea midline; no thyromegaly Lungs: Normal respiratory effort; no tactile fremitus CV: irregular irrregular; no palpable thrills; no pitting edema GI: Abd soft, not terribly distended, old midline incision and old ostomy incision.  Perhaps some diastases in the upper midline, some tenderness to palpation in the left lateral abdomen; no palpable hepatosplenomegaly MSK: Normal gait; no clubbing/cyanosis Psychiatric: Appropriate affect; alert and oriented x3 Lymphatic: No palpable cervical or axillary lymphadenopathy Skin: No rash or lesions, some mild chronic venous insufficiency of lower extremities  Results for orders placed or performed during  the hospital encounter of 08/28/20 (from the past 48 hour(s))  Lipase, blood     Status: None   Collection Time: 08/28/20  7:31 PM  Result Value Ref Range   Lipase 15 11 - 51 U/L    Comment: Performed at [facility], [address], [place], [place] [address]  Comprehensive metabolic panel     Status: None   Collection Time: 08/28/20  7:31 PM  Result Value Ref Range   Sodium 140 135 - 145 mmol/L   Potassium 4.0 3.5 - 5.1 mmol/L   Chloride 105 98 - 111 mmol/L   CO2 28 22 - 32 mmol/L   Glucose, Bld 97 70 - 99 mg/dL    Comment: Glucose reference range applies only to samples taken after fasting for at least 8 hours.   BUN 17 8 - 23 mg/dL   Creatinine, Ser 0.86 0.61 - 1.24 mg/dL   Calcium 9.2 8.9 - 10.3 mg/dL   Total Protein 6.8 6.5 - 8.1 g/dL   Albumin 4.2 3.5 - 5.0 g/dL   AST 24 15 - 41 U/L   ALT  18 0 - 44 U/L   Alkaline Phosphatase 52 38 - 126 U/L   Total Bilirubin 1.2 0.3 - 1.2 mg/dL   GFR, Estimated >60 >60 mL/min    Comment: (NOTE) Calculated using the CKD-EPI Creatinine Equation (2021)    Anion gap 7 5 - 15    Comment: Performed at [facility], [address], [place], [place] [address]  CBC     Status: None   Collection Time: 08/28/20  7:31 PM  Result Value Ref Range   WBC 6.2 4.0 - 10.5 K/uL   RBC 5.24 4.22 - 5.81 MIL/uL   Hemoglobin 15.6 13.0 - 17.0 g/dL   HCT 48.2 39.0 - 52.0 %   MCV 92.0 80.0 - 100.0 fL   MCH 29.8 26.0 - 34.0 pg   MCHC 32.4 30.0 - 36.0 g/dL   RDW 14.2 11.5 - 15.5 %   Platelets 192 150 - 400 K/uL   nRBC 0.0 0.0 - 0.2 %    Comment: Performed at [facility], [address], [place], [place] [address]  Urinalysis, Routine w reflex microscopic Urine, Clean Catch     Status: Abnormal   Collection Time: 08/28/20  7:31 PM  Result Value Ref Range   Color, Urine YELLOW YELLOW   APPearance HAZY (A) CLEAR   Specific Gravity, Urine 1.021 1.005 - 1.030   pH 6.0 5.0 - 8.0   Glucose, UA NEGATIVE  NEGATIVE mg/dL   Hgb urine dipstick LARGE (A) NEGATIVE   Bilirubin Urine NEGATIVE NEGATIVE   Ketones, ur NEGATIVE NEGATIVE mg/dL   Protein, ur TRACE (A) NEGATIVE mg/dL   Nitrite POSITIVE (A) NEGATIVE   Leukocytes,Ua LARGE (A) NEGATIVE   RBC / HPF 21-50 0 - 5 RBC/hpf   WBC, UA >50 (H) 0 - 5 WBC/hpf   Bacteria, UA MANY (A) NONE SEEN   Squamous Epithelial / LPF 0-5 0 - 5   Mucus PRESENT    Amorphous Crystal PRESENT     Comment: Performed at [facility], [address] [place], [place] [address]  Lactic acid, plasma     Status: None   Collection Time: 08/28/20 11:22 PM  Result Value Ref Range   Lactic Acid, Venous 1.9 0.5 - 1.9 mmol/L    Comment: Performed at [facility], [address] [place],  [address]  Resp Panel by RT-PCR (Flu A&B, Covid)     Status: None   Collection Time: 08/28/20 11:22 PM   Specimen: Nasopharyngeal(NP) swabs in vial transport medium  Result Value Ref Range   SARS Coronavirus 2 by RT PCR NEGATIVE NEGATIVE    Comment: (NOTE) SARS-CoV-2 target nucleic acids are NOT DETECTED.  The SARS-CoV-2 RNA is generally detectable in upper respiratory specimens during the acute phase of infection. The lowest concentration of SARS-CoV-2 viral copies this assay can detect is 138 copies/mL. A negative result does not preclude SARS-Cov-2 infection and should not be used as the sole basis for treatment or other patient management decisions. A negative result may occur with  improper specimen collection/handling, submission of specimen other than nasopharyngeal swab, presence of viral mutation(s) within the areas targeted by this assay, and inadequate number of viral copies(<138 copies/mL). A negative result must be combined with clinical observations, patient history, and epidemiological information. The expected result is Negative.  Fact Sheet for Patients:  EntrepreneurPulse.com.au  Fact Sheet for  Healthcare Providers:  IncredibleEmployment.be  This test is no t yet approved or cleared by the [place] and  has been authorized for detection and/or diagnosis of SARS-CoV-2 by FDA under an Emergency Use Authorization (EUA). This EUA will remain  in effect (meaning this test can be used) for the duration of the COVID-19 declaration under Section 564(b)(1) of the Act, 21 U.S.C.section 360bbb-3(b)(1), unless the authorization is terminated  or revoked sooner.       Influenza A by PCR NEGATIVE NEGATIVE   Influenza B by PCR NEGATIVE NEGATIVE    Comment: (NOTE) The Xpert Xpress SARS-CoV-2/FLU/RSV plus assay is intended as an aid in the diagnosis of influenza from Nasopharyngeal swab specimens and should not be used as a sole basis for treatment. Nasal washings and aspirates are unacceptable for Xpert Xpress SARS-CoV-2/FLU/RSV testing.  Fact Sheet for Patients: EntrepreneurPulse.com.au  Fact Sheet for Healthcare Providers: IncredibleEmployment.be  This test is not yet approved or cleared by the [place] FDA and has been authorized for detection and/or diagnosis of SARS-CoV-2 by FDA under an Emergency Use Authorization (EUA). This EUA will remain in effect (meaning this test can be used) for the duration of the COVID-19 declaration under Section 564(b)(1) of the Act, 21 U.S.C. section 360bbb-3(b)(1), unless the authorization is terminated or revoked.  Performed at [facility], [address], [place], [place] [address]   Lactic acid, plasma     Status: None   Collection Time: 08/29/20  2:15 AM  Result Value Ref Range   Lactic Acid, Venous 1.7 0.5 - 1.9 mmol/L    Comment: Performed at [facility], [address], [place], [place] [address]  Basic metabolic panel     Status: Abnormal   Collection Time: 08/29/20  2:41 AM  Result Value Ref Range   Sodium 138 135 - 145 mmol/L    Potassium 4.3 3.5 - 5.1 mmol/L   Chloride 106 98 - 111 mmol/L   CO2 25 22 - 32 mmol/L   Glucose, Bld 122 (H) 70 - 99 mg/dL    Comment: Glucose reference range applies only to samples taken after fasting for at least 8 hours.   BUN 16 8 - 23 mg/dL   Creatinine, Ser 0.85 0.61 - 1.24 mg/dL   Calcium 9.3 8.9 - 10.3 mg/dL   GFR, Estimated >60 >60 mL/min    Comment: (NOTE) Calculated using the CKD-EPI Creatinine Equation (2021)    Anion gap 7 5 - 15    Comment: Performed at [facility] [address] [place], [place] [address]  CBC     Status: Abnormal   Collection Time: 08/29/20  2:41 AM  Result Value Ref Range   WBC 10.6 (H) 4.0 - 10.5 K/uL   RBC 5.20 4.22 - 5.81 MIL/uL   Hemoglobin 15.4 13.0 - 17.0 g/dL   HCT 47.1 39.0 - 52.0 %   MCV 90.6 80.0 - 100.0 fL   MCH 29.6 26.0 - 34.0 pg   MCHC 32.7 30.0 - 36.0 g/dL   RDW 13.8 11.5 - 15.5 %   Platelets 181 150 - 400 K/uL   nRBC 0.0 0.0 - 0.2 %    Comment: Performed at [facility], [place] [address] [place], [place] [address]    CT Abdomen Pelvis W Contrast  Result Date: 08/28/2020 CLINICAL DATA:  Diverticulitis suspected. Sudden onset abdominal pain. EXAM: CT ABDOMEN AND PELVIS WITH CONTRAST TECHNIQUE: Multidetector CT imaging of the abdomen and pelvis was performed using the standard protocol following bolus administration of intravenous contrast. CONTRAST:  171mL OMNIPAQUE IOHEXOL 300 MG/ML  SOLN COMPARISON:  CT abdomen 04/01/14 FINDINGS: Lower chest: Lung bases are clear. Hepatobiliary: No focal hepatic lesion. No biliary duct dilatation. Common bile duct is normal. Pancreas: Pancreas is normal. No ductal dilatation. No pancreatic inflammation. Spleen: Normal spleen Adrenals/urinary tract: Adrenal glands and kidneys normal. Small cortical lesion in the RIGHT kidney is not significant changed from CT 2016. There are multiple diverticula of the bladder along the posterior and anterior wall. Stomach/Bowel: Small hiatal hernia.   Stomach and duodenum normal. A loop of bowel in the LEFT upper quadrant is fluid-filled and has mild inflammatory stranding adjacent to loops (image 39/2). There is air-fluid level within these loops. The loops are mildly dilated up to 3.5 cm (image 39/2.). The bowel upstream and downstream of this dilated loop are small in caliber and the loop has a folded pattern on sagittal imaging (118/6) which could indicate close loop obstruction anatomy. Downstream small bowel is normal. Appendix normal. Ascending transverse colon normal. There are scattered diverticula of the transverse colon. There is descending colon is collapsed. No diverticulitis of the descending colon. Rectosigmoid colon normal. Vascular/Lymphatic: Abdominal aorta is normal caliber. No periportal or retroperitoneal adenopathy. No pelvic adenopathy. Reproductive: Other: No free fluid. Musculoskeletal: No aggressive osseous lesion. IMPRESSION: 1. Inflamed loop of small bowel LEFT upper quadrant with potential closed loop anatomy. This could progress to high-grade obstruction. Recommend surgical consultation. 2. No diverticulitis. 3. Multiple bladder diverticula. Electronically Signed   By: Suzy Bouchard M.D.   On: 08/28/2020 21:07   DG Abd Portable 1V  Result Date: 08/29/2020 CLINICAL DATA:  80 year old male NG tube placement. EXAM: PORTABLE ABDOMEN - 1 VIEW COMPARISON:  CT Abdomen and Pelvis 08/28/2020. FINDINGS: Portable AP supine view at 0411 hours. Enteric tube placed into the stomach. Side hole is at the level of the gastric body. Stable visible bowel gas pattern from the recent CT. No acute cardiopulmonary abnormality identified. IMPRESSION: NG tube placed into the stomach, side hole at the gastric body. Electronically Signed   By: Genevie Ann M.D.   On: 08/29/2020 04:29    Imaging: Personally reviewed  A/P: William Norman is an 80 y.o. male with  Left upper quadrant abdominal pain Partial small bowel obstruction-inflamed small  bowel loop A. Fib History of CVA January 2022 on Eliquis UTI Hypertension Hyperlipidemia  Admit for medical management of his left upper quadrant pain. IV antibiotic for the UTI, follow up urine culture Hold Eliquis He does not have any peritonitis on exam.  He is afebrile.  He is not tachycardic.  He does not have a surgical abdomen.  I do not believe he needs urgent surgical exploration.  I do not necessarily think he has a closed-loop obstruction.  I think he has adhesions from his prior surgeries.  I think we can institute our small bowel obstruction protocol.  I will review his imaging with our daytime team  Discussed that should he clinically worsen and/or fail to improve then he may need exploratory surgery  Leighton Ruff. Redmond Pulling, MD, FACS General, Bariatric, & Minimally Invasive Surgery [facility], [place]

## 2020-08-29 NOTE — Progress Notes (Signed)
PROGRESS NOTE    William Norman  PZW:[id number] DOB: [birth year] DOA: 08/28/2020 PCP: Vivi Barrack, MD   Brief Narrative:  William Norman is a 80 y.o. male with medical history significant for atrial fibrillation on Eliquis, CAD, history of CVA, hypertension, perforated bowel in 2010, colostomy reversal in 2011, and SBO in 2016 that resolved with conservative management, now presenting with abdominal pain. He had been in his usual state of health and normal BM the am of 08/28/20 before developing acute-onset of abdominal pain at approximately 2 pm. Pain was initially "6/10" and aching in character but he went on to develop fleeting, sharp, "10/10" pains every 30 seconds or so. He reports associated nausea without vomiting and abdominal distension. He denies fevers, diarrhea, chest pain, cough, or SOB. Last dose of Eliquis was the morning of 08/28/20.   Assessment & Plan:   Principal Problem:   SBO (small bowel obstruction) (HCC) Active Problems:   Essential hypertension   Personal history of DVT (deep vein thrombosis)   Persistent atrial fibrillation (HCC)   CAD (coronary artery disease)   History of ischemic stroke   Recurrent UTI  SBO  -Patient being followed by general surgery, appreciate insight recommendations -repeat imaging pending -Current plan for conservative management with NG tube, follow for bowel movement flatus, but none since Friday morning per the patient -Abdominal history complicated by bowel perforation in 2010, colostomy reversal in 2011, and SBO in 2016 that resolved with conservative management    UTI, POA, does not meet sepsis criteria -Abnormal UA questionable symptoms of increased frequency -Follow urine cultures, continue ceftriaxone  Atrial fibrillation, rate controlled -CHADS-VASc 6  - Last dose of Eliquis was am of 08/28/20  -Likely transition to IV heparin if Eliquis cannot be resumed in 24-48 hours pending p.o. status  History of CAD   - No anginal complaints  - Resume statin and beta-blocker once his condition allows     History of CVA  - Resume statin and Eliquis once his condition allows     Hypertension  -Currently normotensive    DVT prophylaxis: Eliquis pta -transition to heparin drip in the next 24 to 48 hours if unable to tolerate p.o. given above Code Status: Full Family Communication: None present in  Status is: Patient  Dispo: The patient is from: Home              Anticipated d/c is to: Home              Anticipated d/c date is: 48 to 72 hours              Patient currently not medically stable for discharge  Consultants:  General surgery  Procedures:  None  Antimicrobials:  Ceftriaxone x3 days  Subjective: No acute issues or events overnight denies nausea vomiting diarrhea headache fevers chills shortness of breath or chest pain.  Patient does report constipation/obstipation since the 15th.  Objective: Vitals:   08/28/20 2345 08/29/20 0100 08/29/20 0126 08/29/20 0456  BP: (!) 161/86  (!) 156/78 (!) 160/92  Pulse: 65  78 90  Resp:   19 17  Temp:   98.1 F (36.7 C) 98.7 F (37.1 C)  TempSrc:   Oral Oral  SpO2: 90%  96% 93%  Weight:  97.1 kg    Height:        Intake/Output Summary (Last 24 hours) at 08/29/2020 0734 Last data filed at 08/29/2020 0600 Gross per 24 hour  Intake 1373.57 ml  Output  450 ml  Net 923.57 ml   Filed Weights   08/28/20 1922 08/29/20 0100  Weight: 98.4 kg 97.1 kg    Examination:  General:  Pleasantly resting in bed, No acute distress. HEENT:  Normocephalic atraumatic.  Sclerae nonicteric, noninjected.  Extraocular movements intact bilaterally. Neck:  Without mass or deformity.  Trachea is midline. Lungs:  Clear to auscultate bilaterally without rhonchi, wheeze, or rales. Heart:  Regular rate and rhythm.  Without murmurs, rubs, or gallops. Abdomen:  Soft, nontender, minimally distended, notable scars consistent with surgical history.  Without guarding  or rebound. Extremities: Without cyanosis, clubbing, edema, or obvious deformity. Vascular:  Dorsalis pedis and posterior tibial pulses palpable bilaterally. Skin:  Warm and dry, no erythema, no ulcerations.   Data Reviewed: I have personally reviewed following labs and imaging studies  CBC: Recent Labs  Lab 08/28/20 1931 08/29/20 0241  WBC 6.2 10.6*  HGB 15.6 15.4  HCT 48.2 47.1  MCV 92.0 90.6  PLT 192 892   Basic Metabolic Panel: Recent Labs  Lab 08/28/20 1931 08/29/20 0241  NA 140 138  K 4.0 4.3  CL 105 106  CO2 28 25  GLUCOSE 97 122*  BUN 17 16  CREATININE 0.86 0.85  CALCIUM 9.2 9.3   GFR: Estimated Creatinine Clearance: 83.7 mL/min (by C-G formula based on SCr of 0.85 mg/dL). Liver Function Tests: Recent Labs  Lab 08/28/20 1931  AST 24  ALT 18  ALKPHOS 52  BILITOT 1.2  PROT 6.8  ALBUMIN 4.2   Recent Labs  Lab 08/28/20 1931  LIPASE 15   No results for input(s): AMMONIA in the last 168 hours. Coagulation Profile: No results for input(s): INR, PROTIME in the last 168 hours. Cardiac Enzymes: No results for input(s): CKTOTAL, CKMB, CKMBINDEX, TROPONINI in the last 168 hours. BNP (last 3 results) No results for input(s): PROBNP in the last 8760 hours. HbA1C: No results for input(s): HGBA1C in the last 72 hours. CBG: No results for input(s): GLUCAP in the last 168 hours. Lipid Profile: No results for input(s): CHOL, HDL, LDLCALC, TRIG, CHOLHDL, LDLDIRECT in the last 72 hours. Thyroid Function Tests: No results for input(s): TSH, T4TOTAL, FREET4, T3FREE, THYROIDAB in the last 72 hours. Anemia Panel: No results for input(s): VITAMINB12, FOLATE, FERRITIN, TIBC, IRON, RETICCTPCT in the last 72 hours. Sepsis Labs: Recent Labs  Lab 08/28/20 2322 08/29/20 0215  LATICACIDVEN 1.9 1.7    Recent Results (from the past 240 hour(s))  Resp Panel by RT-PCR (Flu A&B, Covid)     Status: None   Collection Time: 08/28/20 11:22 PM   Specimen:  Nasopharyngeal(NP) swabs in vial transport medium  Result Value Ref Range Status   SARS Coronavirus 2 by RT PCR NEGATIVE NEGATIVE Final    Comment: (NOTE) SARS-CoV-2 target nucleic acids are NOT DETECTED.  The SARS-CoV-2 RNA is generally detectable in upper respiratory specimens during the acute phase of infection. The lowest concentration of SARS-CoV-2 viral copies this assay can detect is 138 copies/mL. A negative result does not preclude SARS-Cov-2 infection and should not be used as the sole basis for treatment or other patient management decisions. A negative result may occur with  improper specimen collection/handling, submission of specimen other than nasopharyngeal swab, presence of viral mutation(s) within the areas targeted by this assay, and inadequate number of viral copies(<138 copies/mL). A negative result must be combined with clinical observations, patient history, and epidemiological information. The expected result is Negative.  Fact Sheet for Patients:  EntrepreneurPulse.com.au  Fact Sheet  for Healthcare Providers:  IncredibleEmployment.be  This test is no t yet approved or cleared by the [place] and  has been authorized for detection and/or diagnosis of SARS-CoV-2 by FDA under an Emergency Use Authorization (EUA). This EUA will remain  in effect (meaning this test can be used) for the duration of the COVID-19 declaration under Section 564(b)(1) of the Act, 21 U.S.C.section 360bbb-3(b)(1), unless the authorization is terminated  or revoked sooner.       Influenza A by PCR NEGATIVE NEGATIVE Final   Influenza B by PCR NEGATIVE NEGATIVE Final    Comment: (NOTE) The Xpert Xpress SARS-CoV-2/FLU/RSV plus assay is intended as an aid in the diagnosis of influenza from Nasopharyngeal swab specimens and should not be used as a sole basis for treatment. Nasal washings and aspirates are unacceptable for Xpert Xpress  SARS-CoV-2/FLU/RSV testing.  Fact Sheet for Patients: EntrepreneurPulse.com.au  Fact Sheet for Healthcare Providers: IncredibleEmployment.be  This test is not yet approved or cleared by the [place] FDA and has been authorized for detection and/or diagnosis of SARS-CoV-2 by FDA under an Emergency Use Authorization (EUA). This EUA will remain in effect (meaning this test can be used) for the duration of the COVID-19 declaration under Section 564(b)(1) of the Act, 21 U.S.C. section 360bbb-3(b)(1), unless the authorization is terminated or revoked.  Performed at [facility], [address], [place], [place] [address]          Radiology Studies: CT Abdomen Pelvis W Contrast  Result Date: 08/28/2020 CLINICAL DATA:  Diverticulitis suspected. Sudden onset abdominal pain. EXAM: CT ABDOMEN AND PELVIS WITH CONTRAST TECHNIQUE: Multidetector CT imaging of the abdomen and pelvis was performed using the standard protocol following bolus administration of intravenous contrast. CONTRAST:  159mL OMNIPAQUE IOHEXOL 300 MG/ML  SOLN COMPARISON:  CT abdomen 04/01/14 FINDINGS: Lower chest: Lung bases are clear. Hepatobiliary: No focal hepatic lesion. No biliary duct dilatation. Common bile duct is normal. Pancreas: Pancreas is normal. No ductal dilatation. No pancreatic inflammation. Spleen: Normal spleen Adrenals/urinary tract: Adrenal glands and kidneys normal. Small cortical lesion in the RIGHT kidney is not significant changed from CT 2016. There are multiple diverticula of the bladder along the posterior and anterior wall. Stomach/Bowel: Small hiatal hernia.  Stomach and duodenum normal. A loop of bowel in the LEFT upper quadrant is fluid-filled and has mild inflammatory stranding adjacent to loops (image 39/2). There is air-fluid level within these loops. The loops are mildly dilated up to 3.5 cm (image 39/2.). The bowel upstream and downstream  of this dilated loop are small in caliber and the loop has a folded pattern on sagittal imaging (118/6) which could indicate close loop obstruction anatomy. Downstream small bowel is normal. Appendix normal. Ascending transverse colon normal. There are scattered diverticula of the transverse colon. There is descending colon is collapsed. No diverticulitis of the descending colon. Rectosigmoid colon normal. Vascular/Lymphatic: Abdominal aorta is normal caliber. No periportal or retroperitoneal adenopathy. No pelvic adenopathy. Reproductive: Other: No free fluid. Musculoskeletal: No aggressive osseous lesion. IMPRESSION: 1. Inflamed loop of small bowel LEFT upper quadrant with potential closed loop anatomy. This could progress to high-grade obstruction. Recommend surgical consultation. 2. No diverticulitis. 3. Multiple bladder diverticula. Electronically Signed   By: Suzy Bouchard M.D.   On: 08/28/2020 21:07   DG Abd Portable 1V  Result Date: 08/29/2020 CLINICAL DATA:  80 year old male NG tube placement. EXAM: PORTABLE ABDOMEN - 1 VIEW COMPARISON:  CT Abdomen and Pelvis 08/28/2020. FINDINGS: Portable AP supine view at  0411 hours. Enteric tube placed into the stomach. Side hole is at the level of the gastric body. Stable visible bowel gas pattern from the recent CT. No acute cardiopulmonary abnormality identified. IMPRESSION: NG tube placed into the stomach, side hole at the gastric body. Electronically Signed   By: Genevie Ann M.D.   On: 08/29/2020 04:29     Scheduled Meds: Continuous Infusions:  cefTRIAXone (ROCEPHIN)  IV 1 g (08/29/20 2707)   lactated ringers 90 mL/hr at 08/29/20 0725     LOS: 0 days   Time spent: 43min  Herchel Hopkin C Osias Resnick, DO [facility]  If 7PM-7AM, please contact night-coverage [email or web address]  08/29/2020, 7:34 AM

## 2020-08-29 NOTE — Plan of Care (Signed)
  Problem: Activity: Goal: Risk for activity intolerance will decrease Outcome: Progressing   

## 2020-08-29 NOTE — Progress Notes (Signed)
ANTICOAGULATION CONSULT NOTE - Initial Consult  Pharmacy Consult for apixaban>> heparin Indication: atrial fibrillation and bridging while NPO for SBO  Allergies  Allergen Reactions   Other Other (See Comments)    Some form of numbing medication used by dentist, almost died   Simvastatin Other (See Comments)    Muscle aches     Patient Measurements: Height: 6' (182.9 cm) Weight: 97.1 kg (214 lb) IBW/kg (Calculated) : 77.6 Heparin Dosing Weight: 97.4 kg   Vital Signs: Temp: 98.2 F (36.8 C) (07/16 1048) Temp Source: Oral (07/16 1048) BP: 123/87 (07/16 1048) Pulse Rate: 82 (07/16 1048)  Labs: Recent Labs    08/28/20 1931 08/29/20 0241  HGB 15.6 15.4  HCT 48.2 47.1  PLT 192 181  CREATININE 0.86 0.85    Estimated Creatinine Clearance: 83.7 mL/min (by C-G formula based on SCr of 0.85 mg/dL).   Medical History: Past Medical History:  Diagnosis Date   BPH (benign prostatic hypertrophy)    BPH associated with nocturia 08/29/2013   BPH with obstruction/lower urinary tract symptoms 08/14/2019   CAD (coronary artery disease)    CAD (coronary artery disease), native coronary artery    PTCA of RCA 1986, PTCA of LAD 1992,  Negative treadmill Cardiolite 2011    Chronic dermatitis of hands 10/12/2015   Chronic venous insufficiency    Coagulation disorder ([place]) 10/26/2018   DVT (deep venous thrombosis) ([place])    Dyslipidemia    Eczema    Erectile dysfunction 07/11/2012   Essential hypertension    GERD 08/04/2006       GERD (gastroesophageal reflux disease)    Hyperglycemia 07/08/2019   Hyperlipidemia    Hypertension    Incomplete emptying of bladder 08/14/2019   Long term current use of anticoagulant therapy    Lumbar disc disease    Overweight (BMI 25.0-29.9) 06/18/2015   Persistent atrial fibrillation ([place]) 04/10/2018   Personal history of DVT (deep vein thrombosis)    Initially in 1998 with recurrence in 2002 now on chronic warfarin    Soft tissue lesion of  foot 08/27/2015   Stroke [facility])    Urinary tract infection with hematuria 08/14/2019    Medications:  Scheduled:   Assessment: 38 yof with known hx of Afib presented with ab pain - now found to have SBO. Last dose apixaban on 7/15@1200 .   Hgb 15.4, plt 181. No s/sx of bleeding. Given >12 hours since last dose, will start heparin infusion now and will monitor both aPTT/HL until correlate.   Goal of Therapy:  Heparin level 0.3-0.7 units/ml aPTT 66-102 seconds Monitor platelets by anticoagulation protocol: Yes   Plan:  Start heparin infusion at 1350 units/hr Check anti-Xa and HL level in 8 hours and daily while on heparin Continue to monitor H&H and platelets  Antonietta Jewel, PharmD, [place] Pharmacist  Phone: [phone] 08/29/2020 4:10 PM  Please check AMION for all [place] phone numbers After 10:00 PM, call [place] [phone]

## 2020-08-29 NOTE — H&P (Addendum)
History and Physical    Deontaye Civello DGU:[id number] DOB: 1941-01-13 DOA: 08/28/2020  PCP: Vivi Barrack, MD   Patient coming from: Home   Chief Complaint: Abdominal pain   HPI: William Norman is a 80 y.o. male with medical history significant for atrial fibrillation on Eliquis, CAD, history of CVA, hypertension, perforated bowel in 2010, colostomy reversal in 2011, and SBO in 2016 that resolved with conservative management, now presenting with abdominal pain. He had been in his usual state of health and normal BM the am of 08/28/20 before developing acute-onset of abdominal pain at approximately 2 pm. Pain was initially "6/10" and aching in character but he went on to develop fleeting, sharp, "10/10" pains every 30 seconds or so. He reports associated nausea without vomiting and abdominal distension. He denies fevers, diarrhea, chest pain, cough, or SOB. Last dose of Eliquis was the morning of 08/28/20.   [facility] ED Course: Upon arrival to the ED, patient is found to be afebrile with normal RR and HR, O2 saturations in 90s on rm air, and stable BP. CMP, lipase, and CBC are normal. COVID negative. UA with many bacteria, large leukocytes, positive nitrites, and >50 WBC/hpf. CT of the abdomen & pelvis was concerning for SBO with question of closed-loop anatomy. Surgery was consulted by ED, did not think that this was a closed-loop obstruction, and recommended medical admission. Urine was sent for culture and patient was treated with IVF, Zosyn, and Dilaudid.   Review of Systems:  All other systems reviewed and apart from HPI, are negative.  Past Medical History:  Diagnosis Date   BPH (benign prostatic hypertrophy)    BPH associated with nocturia 08/29/2013   BPH with obstruction/lower urinary tract symptoms 08/14/2019   CAD (coronary artery disease)    CAD (coronary artery disease), native coronary artery    PTCA of RCA 1986, PTCA of LAD 1992,  Negative treadmill  Cardiolite 2011    Chronic dermatitis of hands 10/12/2015   Chronic venous insufficiency    Coagulation disorder ([place]) 10/26/2018   DVT (deep venous thrombosis) (HCC)    Dyslipidemia    Eczema    Erectile dysfunction 07/11/2012   Essential hypertension    GERD 08/04/2006       GERD (gastroesophageal reflux disease)    Hyperglycemia 07/08/2019   Hyperlipidemia    Hypertension    Incomplete emptying of bladder 08/14/2019   Long term current use of anticoagulant therapy    Lumbar disc disease    Overweight (BMI 25.0-29.9) 06/18/2015   Persistent atrial fibrillation ([place]) 04/10/2018   Personal history of DVT (deep vein thrombosis)    Initially in 1998 with recurrence in 2002 now on chronic warfarin    Soft tissue lesion of foot 08/27/2015   Stroke [facility])    Urinary tract infection with hematuria 08/14/2019    Past Surgical History:  Procedure Laterality Date   CARDIAC CATHETERIZATION     CARPAL TUNNEL RELEASE  04/21/2011   Procedure: CARPAL TUNNEL RELEASE;  Surgeon: Tennis Must, MD;  Location: [place];  Service: Orthopedics;  Laterality: Left;   CARPAL TUNNEL RELEASE  05/23/2011   Procedure: CARPAL TUNNEL RELEASE;  Surgeon: Tennis Must, MD;  Location: [place];  Service: Orthopedics;  Laterality: Right;   COLON SURGERY  2010 and 2011 colostomy reversal   CORONARY ANGIOPLASTY     EYE SURGERY     repair of macular hole   NASAL SEPTUM SURGERY  precutaneous transluminal coronary angioplasty     right hernia     TONSILLECTOMY AND ADENOIDECTOMY      Social History:   reports that he quit smoking about 22 years ago. His smoking use included cigarettes. He has a 60.00 pack-year smoking history. He has never used smokeless tobacco. He reports current alcohol use of about 2.0 - 3.0 standard drinks of alcohol per week. He reports that he does not use drugs.  Allergies  Allergen Reactions   Other Other (See Comments)    Some form of numbing  medication used by dentist, almost died   Simvastatin Other (See Comments)    Muscle aches     Family History  Problem Relation Age of Onset   Heart disease Father    Heart attack Father    Atrial fibrillation Father    Heart disease Mother    Atrial fibrillation Mother    Stroke Mother    AAA (abdominal aortic aneurysm) Brother      Prior to Admission medications   Medication Sig Start Date End Date Taking? Authorizing Provider  amLODipine (NORVASC) 5 MG tablet TAKE 1 TABLET BY MOUTH EVERY OTHER DAY 01/19/20  Yes Vivi Barrack, MD  apixaban (ELIQUIS) 5 MG TABS tablet Take 1 tablet (5 mg total) by mouth 2 (two) times daily. 02/17/20  Yes Danford, Suann Larry, MD  atenolol (TENORMIN) 100 MG tablet TAKE 1 TABLET BY MOUTH EVERY DAY 08/07/20  Yes Vivi Barrack, MD  atorvastatin (LIPITOR) 40 MG tablet TAKE 1/2 TABLET BY MOUTH DAILY 07/03/20  Yes Vivi Barrack, MD  Coenzyme Q10 (COQ10) 200 MG CAPS Take 1 capsule by mouth daily. 07/23/20  Yes Vivi Barrack, MD  Cyanocobalamin (VITAMIN B-12) 1000 MCG SUBL Place 1 tablet (1,000 mcg total) under the tongue daily. 07/24/20  Yes Vivi Barrack, MD  lisinopril (ZESTRIL) 40 MG tablet TAKE 1 TABLET BY MOUTH EVERY DAY 07/14/20  Yes Vivi Barrack, MD  tamsulosin (FLOMAX) 0.4 MG CAPS capsule TAKE 1 CAPSULE (0.4 MG TOTAL) BY MOUTH IN THE MORNING AND AT BEDTIME. 06/02/20  Yes Vivi Barrack, MD  acetaminophen (TYLENOL) 650 MG CR tablet Take 650 mg by mouth as needed for pain.    [provider]  hydroxypropyl methylcellulose / hypromellose (ISOPTO TEARS / GONIOVISC) 2.5 % ophthalmic solution Place 1 drop into both eyes 4 (four) times daily as needed for dry eyes.    [provider]  nitroGLYCERIN (NITROSTAT) 0.4 MG SL tablet Dissolve 1 tablet under the tongue every 5 minutes as needed 01/19/17   Vivi Barrack, MD  PFIZER-BIONT COVID-19 VAC-TRIS SUSP injection  05/20/20   [provider]    Physical Exam: Vitals:    08/28/20 2300 08/28/20 2315 08/28/20 2345 08/29/20 0126  BP:   (!) 161/86 (!) 156/78  Pulse: 67 67 65 78  Resp:    19  Temp:    98.1 F (36.7 C)  TempSrc:    Oral  SpO2: 98% 96% 90% 96%  Weight:      Height:        Constitutional: NAD, calm  Eyes: PERTLA, lids and conjunctivae normal ENMT: Mucous membranes are moist. Posterior pharynx clear of any exudate or lesions.   Neck: supple, no masses  Respiratory: no wheezing, no crackles. No accessory muscle use.  Cardiovascular: Rate ~80 and irreguarly irregular. Mild lower leg edema bilaterally.  Abdomen: Mild distension, soft, tender in mid- and left abdomen with rebound pain, no guarding. Occasional  high-pitched bowel sound.  Musculoskeletal: no clubbing / cyanosis. No joint deformity upper and lower extremities.   Skin: no significant rashes, lesions, ulcers. Warm, dry, well-perfused. Neurologic: CN 2-12 grossly intact. Sensation intact. Moving all extremities.  Psychiatric: Alert and oriented to person, place, and situation. Very pleasant and cooperative.    Labs and Imaging on Admission: I have personally reviewed following labs and imaging studies  CBC: Recent Labs  Lab 08/28/20 1931  WBC 6.2  HGB 15.6  HCT 48.2  MCV 92.0  PLT 409   Basic Metabolic Panel: Recent Labs  Lab 08/28/20 1931  NA 140  K 4.0  CL 105  CO2 28  GLUCOSE 97  BUN 17  CREATININE 0.86  CALCIUM 9.2   GFR: Estimated Creatinine Clearance: 83.2 mL/min (by C-G formula based on SCr of 0.86 mg/dL). Liver Function Tests: Recent Labs  Lab 08/28/20 1931  AST 24  ALT 18  ALKPHOS 52  BILITOT 1.2  PROT 6.8  ALBUMIN 4.2   Recent Labs  Lab 08/28/20 1931  LIPASE 15   No results for input(s): AMMONIA in the last 168 hours. Coagulation Profile: No results for input(s): INR, PROTIME in the last 168 hours. Cardiac Enzymes: No results for input(s): CKTOTAL, CKMB, CKMBINDEX, TROPONINI in the last 168 hours. BNP (last 3 results) No results for  input(s): PROBNP in the last 8760 hours. HbA1C: No results for input(s): HGBA1C in the last 72 hours. CBG: No results for input(s): GLUCAP in the last 168 hours. Lipid Profile: No results for input(s): CHOL, HDL, LDLCALC, TRIG, CHOLHDL, LDLDIRECT in the last 72 hours. Thyroid Function Tests: No results for input(s): TSH, T4TOTAL, FREET4, T3FREE, THYROIDAB in the last 72 hours. Anemia Panel: No results for input(s): VITAMINB12, FOLATE, FERRITIN, TIBC, IRON, RETICCTPCT in the last 72 hours. Urine analysis:    Component Value Date/Time   COLORURINE YELLOW 08/28/2020 1931   APPEARANCEUR HAZY (A) 08/28/2020 1931   LABSPEC 1.021 08/28/2020 1931   PHURINE 6.0 08/28/2020 1931   GLUCOSEU NEGATIVE 08/28/2020 1931   HGBUR LARGE (A) 08/28/2020 1931   HGBUR 1+ 02/13/2007 0000   BILIRUBINUR NEGATIVE 08/28/2020 1931   BILIRUBINUR negative 09/23/2019 1527   BILIRUBINUR n 10/14/2014 1012   KETONESUR NEGATIVE 08/28/2020 1931   PROTEINUR TRACE (A) 08/28/2020 1931   UROBILINOGEN 0.2 09/23/2019 1527   UROBILINOGEN 0.2 03/21/2014 0420   NITRITE POSITIVE (A) 08/28/2020 1931   LEUKOCYTESUR LARGE (A) 08/28/2020 1931   Sepsis Labs: @LABRCNTIP (procalcitonin:4,lacticidven:4) ) Recent Results (from the past 240 hour(s))  Resp Panel by RT-PCR (Flu A&B, Covid)     Status: None   Collection Time: 08/28/20 11:22 PM   Specimen: Nasopharyngeal(NP) swabs in vial transport medium  Result Value Ref Range Status   SARS Coronavirus 2 by RT PCR NEGATIVE NEGATIVE Final    Comment: (NOTE) SARS-CoV-2 target nucleic acids are NOT DETECTED.  The SARS-CoV-2 RNA is generally detectable in upper respiratory specimens during the acute phase of infection. The lowest concentration of SARS-CoV-2 viral copies this assay can detect is 138 copies/mL. A negative result does not preclude SARS-Cov-2 infection and should not be used as the sole basis for treatment or other patient management decisions. A negative result may  occur with  improper specimen collection/handling, submission of specimen other than nasopharyngeal swab, presence of viral mutation(s) within the areas targeted by this assay, and inadequate number of viral copies(<138 copies/mL). A negative result must be combined with clinical observations, patient history, and epidemiological information. The expected result is Negative.  Fact Sheet for Patients:  EntrepreneurPulse.com.au  Fact Sheet for Healthcare Providers:  IncredibleEmployment.be  This test is no t yet approved or cleared by the [place] FDA and  has been authorized for detection and/or diagnosis of SARS-CoV-2 by FDA under an Emergency Use Authorization (EUA). This EUA will remain  in effect (meaning this test can be used) for the duration of the COVID-19 declaration under Section 564(b)(1) of the Act, 21 U.S.C.section 360bbb-3(b)(1), unless the authorization is terminated  or revoked sooner.       Influenza A by PCR NEGATIVE NEGATIVE Final   Influenza B by PCR NEGATIVE NEGATIVE Final    Comment: (NOTE) The Xpert Xpress SARS-CoV-2/FLU/RSV plus assay is intended as an aid in the diagnosis of influenza from Nasopharyngeal swab specimens and should not be used as a sole basis for treatment. Nasal washings and aspirates are unacceptable for Xpert Xpress SARS-CoV-2/FLU/RSV testing.  Fact Sheet for Patients: EntrepreneurPulse.com.au  Fact Sheet for Healthcare Providers: IncredibleEmployment.be  This test is not yet approved or cleared by the [place] FDA and has been authorized for detection and/or diagnosis of SARS-CoV-2 by FDA under an Emergency Use Authorization (EUA). This EUA will remain in effect (meaning this test can be used) for the duration of the COVID-19 declaration under Section 564(b)(1) of the Act, 21 U.S.C. section 360bbb-3(b)(1), unless the authorization is terminated  or revoked.  Performed at [facility], [address] [place], [place] [address]      Radiological Exams on Admission: CT Abdomen Pelvis W Contrast  Result Date: 08/28/2020 CLINICAL DATA:  Diverticulitis suspected. Sudden onset abdominal pain. EXAM: CT ABDOMEN AND PELVIS WITH CONTRAST TECHNIQUE: Multidetector CT imaging of the abdomen and pelvis was performed using the standard protocol following bolus administration of intravenous contrast. CONTRAST:  164mL OMNIPAQUE IOHEXOL 300 MG/ML  SOLN COMPARISON:  CT abdomen 04/01/14 FINDINGS: Lower chest: Lung bases are clear. Hepatobiliary: No focal hepatic lesion. No biliary duct dilatation. Common bile duct is normal. Pancreas: Pancreas is normal. No ductal dilatation. No pancreatic inflammation. Spleen: Normal spleen Adrenals/urinary tract: Adrenal glands and kidneys normal. Small cortical lesion in the RIGHT kidney is not significant changed from CT 2016. There are multiple diverticula of the bladder along the posterior and anterior wall. Stomach/Bowel: Small hiatal hernia.  Stomach and duodenum normal. A loop of bowel in the LEFT upper quadrant is fluid-filled and has mild inflammatory stranding adjacent to loops (image 39/2). There is air-fluid level within these loops. The loops are mildly dilated up to 3.5 cm (image 39/2.). The bowel upstream and downstream of this dilated loop are small in caliber and the loop has a folded pattern on sagittal imaging (118/6) which could indicate close loop obstruction anatomy. Downstream small bowel is normal. Appendix normal. Ascending transverse colon normal. There are scattered diverticula of the transverse colon. There is descending colon is collapsed. No diverticulitis of the descending colon. Rectosigmoid colon normal. Vascular/Lymphatic: Abdominal aorta is normal caliber. No periportal or retroperitoneal adenopathy. No pelvic adenopathy. Reproductive: Other: No free fluid. Musculoskeletal: No  aggressive osseous lesion. IMPRESSION: 1. Inflamed loop of small bowel LEFT upper quadrant with potential closed loop anatomy. This could progress to high-grade obstruction. Recommend surgical consultation. 2. No diverticulitis. 3. Multiple bladder diverticula. Electronically Signed   By: Suzy Bouchard M.D.   On: 08/28/2020 21:07    Assessment/Plan   1. SBO  - Patient with hx of bowel perforation in 2010, colostomy reversal in 2011, and SBO in 2016 that resolved with conservative  management presents with acute-onset abd pain and has CT findings concerning for SBO, possibly closed-loop - Lactic acid 1.9; hemodynamics stable  - Surgery was consulted by ED and much appreciated, they do not believe this is closed-loop  - NPO, NGT decompression, pain-control, IVF, monitor electrolytes, follow-up surgery recommendations    2. UTI  - Urine cultured and antibiotics started in ED  - No fever or leukocytosis on admission  - Treat with Rocephin, follow culture    3. Atrial fibrillation  - In rate-controlled a fib on admission  - CHADS-VASc 46 (age x2, CVA x2, CAD, HTN)  - Last dose of Eliquis was am of 08/28/20  - Use IV Lopressor as needed for rate-control, consider anticoagulation with IV heparin if Eliquis cannot be resumed in 24-48 hours    4. CAD  - No anginal complaints  - Resume statin and beta-blocker once his condition allows    5. History of CVA  - Resume statin and Eliquis once his condition allows    6. Hypertension  - Treat as-needed only for now     DVT prophylaxis: Eliquis pta   Code Status: Full, confirmed with patient on admission  Level of Care: Level of care: Med-Surg Family Communication: None present  Disposition Plan:  Patient is from: Home  Anticipated d/c is to: Home  Anticipated d/c date is: 08/31/20 Patient currently: Pending surgery evaluation, return of bowel function  Consults called: Surgery  Admission status: Inpatient     Vianne Bulls, MD [facility]  08/29/2020, 2:04 AM

## 2020-08-30 ENCOUNTER — Inpatient Hospital Stay (HOSPITAL_COMMUNITY): Payer: Medicare HMO

## 2020-08-30 DIAGNOSIS — K56609 Unspecified intestinal obstruction, unspecified as to partial versus complete obstruction: Secondary | ICD-10-CM | POA: Diagnosis not present

## 2020-08-30 LAB — URINE CULTURE

## 2020-08-30 LAB — CBC
HCT: 50.2 % (ref 39.0–52.0)
Hemoglobin: 16.5 g/dL (ref 13.0–17.0)
MCH: 29.6 pg (ref 26.0–34.0)
MCHC: 32.9 g/dL (ref 30.0–36.0)
MCV: 90.1 fL (ref 80.0–100.0)
Platelets: 205 10*3/uL (ref 150–400)
RBC: 5.57 MIL/uL (ref 4.22–5.81)
RDW: 13.8 % (ref 11.5–15.5)
WBC: 12.2 10*3/uL — ABNORMAL HIGH (ref 4.0–10.5)
nRBC: 0 % (ref 0.0–0.2)

## 2020-08-30 LAB — BASIC METABOLIC PANEL
Anion gap: 11 (ref 5–15)
BUN: 14 mg/dL (ref 8–23)
CO2: 25 mmol/L (ref 22–32)
Calcium: 9.4 mg/dL (ref 8.9–10.3)
Chloride: 100 mmol/L (ref 98–111)
Creatinine, Ser: 0.98 mg/dL (ref 0.61–1.24)
GFR, Estimated: >60 mL/min (ref >60)
Glucose, Bld: 117 mg/dL — ABNORMAL HIGH (ref 70–99)
Potassium: 3.8 mmol/L (ref 3.5–5.1)
Sodium: 136 mmol/L (ref 135–145)

## 2020-08-30 LAB — APTT
aPTT: 69 seconds — ABNORMAL HIGH (ref 24–36)
aPTT: 68 seconds — ABNORMAL HIGH (ref 24–36)

## 2020-08-30 LAB — HEPARIN LEVEL (UNFRACTIONATED): Heparin Unfractionated: 0.64 IU/mL (ref 0.30–0.70)

## 2020-08-30 IMAGING — DX DG ABD PORTABLE 1V
1 series · 1 of 1 positions shown · non-contrast
Comparison: Earlier film of the same day

CLINICAL DATA: Nasogastric tube replacement

EXAM:
PORTABLE ABDOMEN - 1 VIEW

[abdomen]
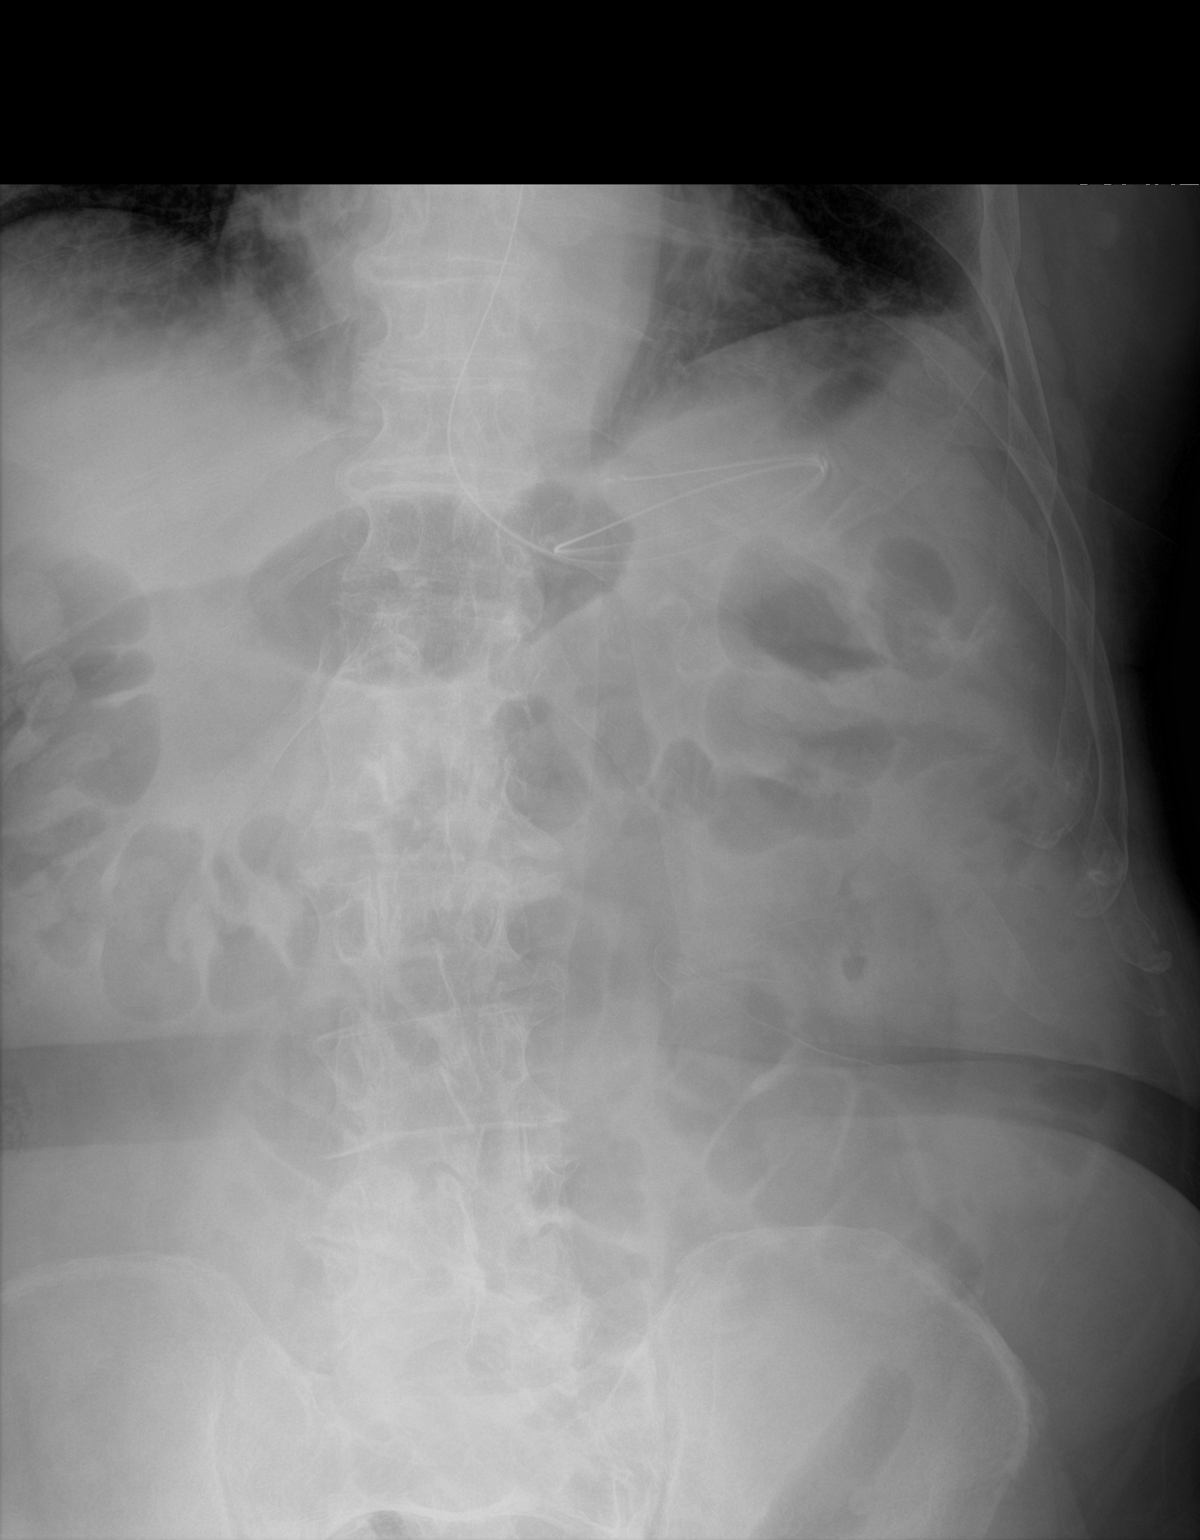

[1 of 1 positions shown; findings below may reference images not displayed]

FINDINGS: Nasogastric tube loops in the decompressed gastric fundus. A few gas
filled mid abdominal small bowel loops. The right lateral and
inferior abdomen are excluded.
IMPRESSION: Nasogastric tube loops in the gastric fundus.

## 2020-08-30 IMAGING — DX DG ABD PORTABLE 1V
1 series · 1 of 1 positions shown · non-contrast
Comparison: [DATE], [DATE]

CLINICAL DATA: Small-bowel obstruction

EXAM:
PORTABLE ABDOMEN - 1 VIEW

[abdomen kub]
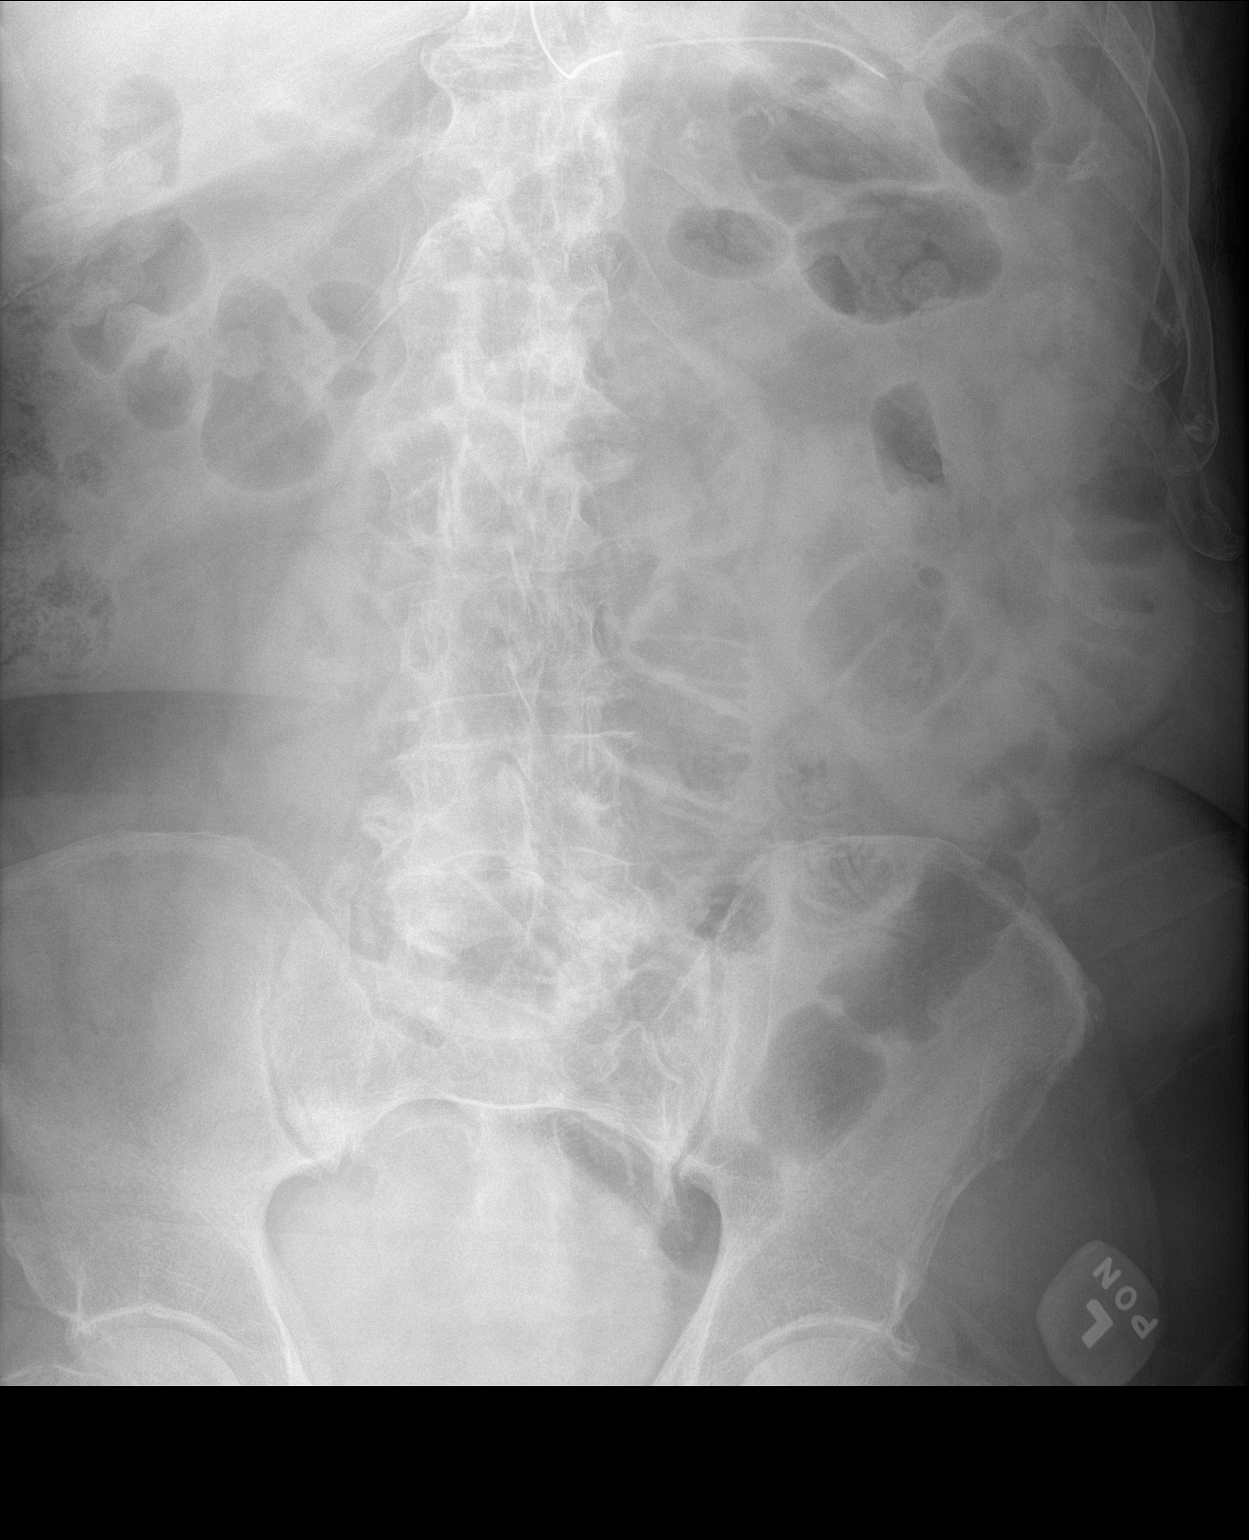

[1 of 1 positions shown; findings below may reference images not displayed]

FINDINGS: Enteric tube terminates within the gastric body. Abnormally dilated
loop of small bowel within the left hemiabdomen measuring up to
cm in diameter, which appears to be thickened. Faint dilute contrast
within small bowel, but not definitively seen within the colon.
Excreted contrast within the urinary bladder. No gross
pneumoperitoneum.
IMPRESSION: Abnormally dilated loop of small bowel within the left hemiabdomen,
which appears to be thickened. Faint dilute contrast within small
bowel, but not definitively seen within the colon. Findings
concerning for worsening small bowel obstruction, possibly closed
loop given appearance on previous CT.

## 2020-08-30 MED ORDER — SODIUM CHLORIDE 0.9 % IV SOLN
INTRAVENOUS | Status: DC | PRN
  Administered 2020-08-30 – 2020-08-31 (×2): 250 mL via INTRAVENOUS

## 2020-08-30 NOTE — Progress Notes (Signed)
Patient's nasogastric tube out, "I don't know what happened,I didn't pull it out." MD,Chot[place] notified,order to replace NGT.

## 2020-08-30 NOTE — Progress Notes (Deleted)
[place] for apixaban>> heparin Indication: atrial fibrillation and bridging while NPO for SBO  Allergies  Allergen Reactions   Other Other (See Comments)    Some form of numbing medication used by dentist, almost died   Simvastatin Other (See Comments)    Muscle aches     Patient Measurements: Height: 6' (182.9 cm) Weight: 99.1 kg (218 lb 7.6 oz) IBW/kg (Calculated) : 77.6 Heparin Dosing Weight: 97.4 kg   Vital Signs: Temp: 98.6 F (37 C) (07/17 0501) Temp Source: Oral (07/17 0501) BP: 160/94 (07/17 0501) Pulse Rate: 74 (07/17 0501)  Labs: Recent Labs    08/28/20 1931 08/29/20 0241 08/30/20 0031 08/30/20 0715  HGB 15.6 15.4 16.5  --   HCT 48.2 47.1 50.2  --   PLT 192 181 205  --   APTT  --   --  69* 68*  HEPARINUNFRC  --   --  0.64  --   CREATININE 0.86 0.85 0.98  --      Estimated Creatinine Clearance: 73.3 mL/min (by C-G formula based on SCr of 0.98 mg/dL).   Assessment: 66 yof with known hx of Afib presented with ab pain - now found to have SBO and is NPO, requiring parenteral anticoagulation for atrial fibrillation (CHADSVASC 6). Last dose apixaban on 7/15@1200 .    PTT 68 sec (therapeutic) on gtt at 1350 units/hr. PLT WNL, Hgb stable. BL heparin level 0.64 and PTT correlate.   Goal of Therapy:  Heparin level 0.3-0.7 units/ml aPTT 66-102 seconds Monitor platelets by anticoagulation protocol: Yes   Plan:  Continue heparin infusion at 1350 units/hr Titrate based on heparin levels  Daily heparin level, and CBC  Adria Dill, PharmD PGY-1 Acute Care Resident  08/30/2020 9:32 AM   I discussed / reviewed the pharmacy note by Dr. Encarnacion Chu and I agree with the resident's findings and plans as documented.   Thank you  Anette Guarneri, PharmD

## 2020-08-30 NOTE — Progress Notes (Signed)
Subjective: CC: Feels better. Abdominal pain has resolved. Feels less distended/bloated. Still having occasional nausea. 2.55L out from NGT since placement. No flatus or bm.   Objective: Vital signs in last 24 hours: Temp:  [98.1 F (36.7 C)-99 F (37.2 C)] 98.6 F (37 C) (07/17 0501) Pulse Rate:  [74-86] 74 (07/17 0501) Resp:  [18-20] 20 (07/17 0501) BP: (123-160)/(84-94) 160/94 (07/17 0501) SpO2:  [92 %-94 %] 92 % (07/17 0501) Weight:  [99.1 kg] 99.1 kg (07/17 0501) Last BM Date: 08/28/20  Intake/Output from previous day: 07/16 0701 - 07/17 0700 In: 1988.8 [I.V.:1887.7; IV Piggyback:101.1] Out: 2625 [Urine:75; Emesis/NG output:2550] Intake/Output this shift: No intake/output data recorded.  PE: Gen:  Alert, NAD, pleasant Pulm:  Normal rate and effort Abd: Soft, minimal distension, very mild tenderness of the LUQ without peritonitis, hypoactive bowel sounds, NGT in place with bilious output in cannister. Prior abdominal scars noted and well healed.  Psych: A&Ox3  Skin: no rashes noted, warm and dry  Lab Results:  Recent Labs    08/29/20 0241 08/30/20 0031  WBC 10.6* 12.2*  HGB 15.4 16.5  HCT 47.1 50.2  PLT 181 205   BMET Recent Labs    08/29/20 0241 08/30/20 0031  NA 138 136  K 4.3 3.8  CL 106 100  CO2 25 25  GLUCOSE 122* 117*  BUN 16 14  CREATININE 0.85 0.98  CALCIUM 9.3 9.4   PT/INR No results for input(s): LABPROT, INR in the last 72 hours. CMP     Component Value Date/Time   NA 136 08/30/2020 0031   NA 141 12/03/2019 1509   K 3.8 08/30/2020 0031   CL 100 08/30/2020 0031   CO2 25 08/30/2020 0031   GLUCOSE 117 (H) 08/30/2020 0031   GLUCOSE 94 01/10/2006 1108   BUN 14 08/30/2020 0031   BUN 10 12/03/2019 1509   CREATININE 0.98 08/30/2020 0031   CREATININE 0.84 09/30/2019 1349   CALCIUM 9.4 08/30/2020 0031   PROT 6.8 08/28/2020 1931   PROT 6.5 12/03/2019 1509   ALBUMIN 4.2 08/28/2020 1931   ALBUMIN 4.2 12/03/2019 1509   AST 24  08/28/2020 1931   ALT 18 08/28/2020 1931   ALKPHOS 52 08/28/2020 1931   BILITOT 1.2 08/28/2020 1931   BILITOT 0.9 12/03/2019 1509   GFRNONAA >60 08/30/2020 0031   GFRAA 97 12/03/2019 1509   Lipase     Component Value Date/Time   LIPASE 15 08/28/2020 1931       Studies/Results: DG Abd 1 View  Result Date: 08/30/2020 CLINICAL DATA:  80 year old male status post nasogastric tube placement. EXAM: ABDOMEN - 1 VIEW COMPARISON:  08/29/2020. FINDINGS: Nasogastric tube extends into the proximal stomach with side port at or immediately distal to the gastroesophageal junction. Visualized bowel gas pattern is unremarkable. Visualized portions of the lungs suggest a background of interstitial pulmonary edema. Mild cardiomegaly. IMPRESSION: 1. Tip of nasogastric tube is in the proximal stomach and could be advanced approximately 10 cm for more optimal placement. 2. Chest is incompletely imaged, but visualized portions suggests underlying congestive heart failure. Follow-up chest radiograph is recommended. Electronically Signed   By: [name] M.D.   On: 08/30/2020 07:50   CT Abdomen Pelvis W Contrast  Result Date: 08/28/2020 CLINICAL DATA:  Diverticulitis suspected. Sudden onset abdominal pain. EXAM: CT ABDOMEN AND PELVIS WITH CONTRAST TECHNIQUE: Multidetector CT imaging of the abdomen and pelvis was performed using the standard protocol following bolus administration of intravenous contrast. CONTRAST:  187mL OMNIPAQUE IOHEXOL 300 MG/ML  SOLN COMPARISON:  CT abdomen 04/01/14 FINDINGS: Lower chest: Lung bases are clear. Hepatobiliary: No focal hepatic lesion. No biliary duct dilatation. Common bile duct is normal. Pancreas: Pancreas is normal. No ductal dilatation. No pancreatic inflammation. Spleen: Normal spleen Adrenals/urinary tract: Adrenal glands and kidneys normal. Small cortical lesion in the RIGHT kidney is not significant changed from CT 2016. There are multiple diverticula of the bladder  along the posterior and anterior wall. Stomach/Bowel: Small hiatal hernia.  Stomach and duodenum normal. A loop of bowel in the LEFT upper quadrant is fluid-filled and has mild inflammatory stranding adjacent to loops (image 39/2). There is air-fluid level within these loops. The loops are mildly dilated up to 3.5 cm (image 39/2.). The bowel upstream and downstream of this dilated loop are small in caliber and the loop has a folded pattern on sagittal imaging (118/6) which could indicate close loop obstruction anatomy. Downstream small bowel is normal. Appendix normal. Ascending transverse colon normal. There are scattered diverticula of the transverse colon. There is descending colon is collapsed. No diverticulitis of the descending colon. Rectosigmoid colon normal. Vascular/Lymphatic: Abdominal aorta is normal caliber. No periportal or retroperitoneal adenopathy. No pelvic adenopathy. Reproductive: Other: No free fluid. Musculoskeletal: No aggressive osseous lesion. IMPRESSION: 1. Inflamed loop of small bowel LEFT upper quadrant with potential closed loop anatomy. This could progress to high-grade obstruction. Recommend surgical consultation. 2. No diverticulitis. 3. Multiple bladder diverticula. Electronically Signed   By: Suzy Bouchard M.D.   On: 08/28/2020 21:07   DG Abd Portable 1V-Small Bowel Obstruction Protocol-initial, 8 hr delay  Result Date: 08/29/2020 CLINICAL DATA:  Small-bowel obstruction EXAM: PORTABLE ABDOMEN - 1 VIEW COMPARISON:  08/29/2020 FINDINGS: Three supine frontal views of the abdomen and pelvis are obtained after the administration of Gastroview approximately 10 hours prior to imaging. Enteric catheter is seen within the gastric lumen. There is significant retention oral contrast within the stomach. Slow diffusion of oral contrast is seen filling dilated loops of small bowel within the lower central abdomen and pelvis. No evidence of transit of contrast into the colon at the time of  imaging. Excreted contrast is seen within the urinary bladder. IMPRESSION: 1. Dilute oral contrast filling multiple dilated loops of small bowel throughout the lower abdomen and pelvis, compatible with small-bowel obstruction. No contrast identified within the colon at the time of imaging. 2. Significant retention of oral contrast within the stomach, with indwelling enteric catheter as above. Electronically Signed   By: Randa Ngo M.D.   On: 08/29/2020 15:49   DG Abd Portable 1V  Result Date: 08/29/2020 CLINICAL DATA:  80 year old male NG tube placement. EXAM: PORTABLE ABDOMEN - 1 VIEW COMPARISON:  CT Abdomen and Pelvis 08/28/2020. FINDINGS: Portable AP supine view at 0411 hours. Enteric tube placed into the stomach. Side hole is at the level of the gastric body. Stable visible bowel gas pattern from the recent CT. No acute cardiopulmonary abnormality identified. IMPRESSION: NG tube placed into the stomach, side hole at the gastric body. Electronically Signed   By: Genevie Ann M.D.   On: 08/29/2020 04:29    Anti-infectives: Anti-infectives (From admission, onward)    Start     Dose/Rate Route Frequency Ordered Stop   08/29/20 0600  cefTRIAXone (ROCEPHIN) 1 g in sodium chloride 0.9 % 100 mL IVPB        1 g 200 mL/hr over 30 Minutes Intravenous Every 24 hours 08/29/20 0204     08/28/20 2300  piperacillin-tazobactam (  ZOSYN) IVPB 3.375 g        3.375 g 100 mL/hr over 30 Minutes Intravenous  Once 08/28/20 2245 08/28/20 2342        Assessment/Plan Left upper quadrant abdominal pain Partial small bowel obstruction-inflamed small bowel loop - He has a remote history of perforated sigmoid diverticulitis and an incarcerated left inguinal hernia for which he underwent urgent sigmoid colectomy with colostomy and and repair of a incarcerated left indirect inguinal hernia by Dr. Dalbert Batman in 2010.  He subsequently underwent reversal in 2011 with takedown and closure of colostomy, lysis of adhesions and repair  of parastomal hernia with Stratus mesh. - CT scan noted possible closed loop. Was not felt to have surgical abdomen on admission. He appears to have improved this am and his abdomen remains very soft with minimal tenderness. Continue conservative management.  - SBO protocol. AM xray pending. Appears NGT needs to be advanced on previous xray. Will discuss with RN if this has already been done.  - Keep K > 4 and Mg >2 for bowel function - Mobilize for bowel function - Hopefully patient improves with conservative measures. Discussed that should he clinically worsen and/or fail to improve then he may need exploratory surgery  FEN - NPO, NGT, IVF per [facility] VTE - SCDs, heparin gtt ID - Rocephin for UTI per [facility]  A. Fib on Eliquis - on hold  History of CVA January 2022  UTI Hypertension Hyperlipidemia   LOS: 1 day    Jillyn Ledger , [facility] Surgery 08/30/2020, 9:31 AM Please see Amion for pager number during day hours 7:00am-4:30pm

## 2020-08-30 NOTE — Plan of Care (Signed)
  Problem: Safety: Goal: Ability to remain free from injury will improve Outcome: Progressing   

## 2020-08-30 NOTE — Progress Notes (Signed)
NGT came out while patient is ambulating in the hallway. NGT replaced. ABD xray done, results pending.

## 2020-08-30 NOTE — Progress Notes (Signed)
[place] for apixaban>> heparin Indication: atrial fibrillation and bridging while NPO for SBO  Allergies  Allergen Reactions   Other Other (See Comments)    Some form of numbing medication used by dentist, almost died   Simvastatin Other (See Comments)    Muscle aches     Patient Measurements: Height: 6' (182.9 cm) Weight: 99.1 kg (218 lb 7.6 oz) IBW/kg (Calculated) : 77.6 Heparin Dosing Weight: 97.4 kg   Vital Signs: Temp: 98.6 F (37 C) (07/17 0501) Temp Source: Oral (07/17 0501) BP: 160/94 (07/17 0501) Pulse Rate: 74 (07/17 0501)  Labs: Recent Labs    08/28/20 1931 08/29/20 0241 08/30/20 0031 08/30/20 0715  HGB 15.6 15.4 16.5  --   HCT 48.2 47.1 50.2  --   PLT 192 181 205  --   APTT  --   --  69* 68*  HEPARINUNFRC  --   --  0.64  --   CREATININE 0.86 0.85 0.98  --      Estimated Creatinine Clearance: 73.3 mL/min (by C-G formula based on SCr of 0.98 mg/dL).   Assessment: 83 yof with known hx of Afib presented with ab pain - now found to have SBO and is NPO, requiring parenteral anticoagulation for atrial fibrillation (CHADSVASC 6). Last dose apixaban on 7/15@1200 .    PTT 68 sec (therapeutic) on gtt at 1350 units/hr. PLT WNL, Hgb stable. BL heparin level 0.64 and PTT correlate. No signs of bleeding or issues with infusion per nurse.   Goal of Therapy:  Heparin level 0.3-0.7 units/ml aPTT 66-102 seconds Monitor platelets by anticoagulation protocol: Yes   Plan:  Continue heparin infusion at 1350 units/hr Titrate based on heparin levels  Daily heparin level, and CBC  Adria Dill, PharmD PGY-1 Acute Care Resident  08/30/2020 9:19 AM

## 2020-08-30 NOTE — Progress Notes (Signed)
[place] for apixaban>> heparin Indication: atrial fibrillation and bridging while NPO for SBO  Allergies  Allergen Reactions   Other Other (See Comments)    Some form of numbing medication used by dentist, almost died   Simvastatin Other (See Comments)    Muscle aches     Patient Measurements: Height: 6' (182.9 cm) Weight: 97.1 kg (214 lb) IBW/kg (Calculated) : 77.6 Heparin Dosing Weight: 97.4 kg   Vital Signs: Temp: 98.1 F (36.7 C) (07/16 2106) Temp Source: Oral (07/16 2106) BP: 143/84 (07/16 2106) Pulse Rate: 86 (07/16 2106)  Labs: Recent Labs    08/28/20 1931 08/29/20 0241 08/30/20 0031  HGB 15.6 15.4 16.5  HCT 48.2 47.1 50.2  PLT 192 181 205  APTT  --   --  69*  HEPARINUNFRC  --   --  0.64  CREATININE 0.86 0.85 0.98     Estimated Creatinine Clearance: 72.6 mL/min (by C-G formula based on SCr of 0.98 mg/dL).   Assessment: 58 yof with known hx of Afib presented with ab pain - now found to have SBO. Last dose apixaban on 7/15@1200 .   Heparin level 0.64 (Affected by apixaban), PTT 69 sec (therapeutic) on gtt at 1350 units/hr. No bleeding noted.  Goal of Therapy:  Heparin level 0.3-0.7 units/ml aPTT 66-102 seconds Monitor platelets by anticoagulation protocol: Yes   Plan:  Continue heparin infusion at 1350 units/hr Will f/u 6h PTT to confirm therapeutic Daily heparin level, PTT, and CBC  Sherlon Handing, PharmD, BCPS Please see amion for complete clinical pharmacist phone list 08/30/2020 1:16 AM

## 2020-08-30 NOTE — Progress Notes (Signed)
PROGRESS NOTE    William Norman  XLK:[id number] DOB: 02-18-1940 DOA: 08/28/2020 PCP: Vivi Barrack, MD   Brief Narrative:  William Norman is a 80 y.o. male with medical history significant for atrial fibrillation on Eliquis, CAD, history of CVA, hypertension, perforated bowel in 2010, colostomy reversal in 2011, and SBO in 2016 that resolved with conservative management, now presenting with abdominal pain. He had been in his usual state of health and normal BM the am of 08/28/20 before developing acute-onset of abdominal pain at approximately 2 pm. Pain was initially "6/10" and aching in character but he went on to develop fleeting, sharp, "10/10" pains every 30 seconds or so. He reports associated nausea without vomiting and abdominal distension. He denies fevers, diarrhea, chest pain, cough, or SOB. Last dose of Eliquis was the morning of 08/28/20.   Assessment & Plan:   Principal Problem:   SBO (small bowel obstruction) (HCC) Active Problems:   Essential hypertension   Personal history of DVT (deep vein thrombosis)   Persistent atrial fibrillation (HCC)   CAD (coronary artery disease)   History of ischemic stroke   Recurrent UTI  SBO, ongoing  -Patient being followed by general surgery, appreciate insight recommendations -repeat imaging shows ongoing dilated loops of small bowel with little contrast penetrance (none noted in the colon) -Current plan for conservative management with NG tube, follow for bowel movement flatus, but none since Friday morning per the patient -Abdominal history complicated by bowel perforation in 2010, colostomy reversal in 2011, and SBO in 2016 that resolved with conservative management    UTI, POA, does not meet sepsis criteria -Abnormal UA questionable symptoms of increased frequency -Follow urine cultures, continue ceftriaxone x3 days  Atrial fibrillation, rate controlled -CHADS-VASc 6  - Last dose of Eliquis was am of 08/28/20   -Likely transition to IV heparin  History of CAD  - No anginal complaints  - Resume statin and beta-blocker once his condition allows     History of CVA  - Resume statin and Eliquis once his condition allows     Hypertension  -Currently normotensive    DVT prophylaxis: Heparin Code Status: Full Family Communication: None present in  Status is: Patient  Dispo: The patient is from: Home              Anticipated d/c is to: Home              Anticipated d/c date is: 48 to 72 hours pending resolution of obstruction              Patient currently not medically stable for discharge  Consultants:  General surgery  Procedures:  None  Antimicrobials:  Ceftriaxone x3 days  Subjective: NG tube pulled out accidentally overnight  - replaced without issue - reports pain after NG removal due to distention/pain - denies nausea vomiting diarrhea headache fevers chills shortness of breath or chest pain.   Objective: Vitals:   08/29/20 1720 08/29/20 2106 08/30/20 0501 08/30/20 0501  BP: (!) 150/86 (!) 143/84 (!) 160/94 (!) 160/94  Pulse: 81 86 82 74  Resp: 18 19 20 20   Temp: 99 F (37.2 C) 98.1 F (36.7 C) 98.6 F (37 C)   TempSrc: Oral Oral Oral Oral  SpO2: 92% 94% 93% 92%  Weight:    99.1 kg  Height:        Intake/Output Summary (Last 24 hours) at 08/30/2020 0732 Last data filed at 08/30/2020 0600 Gross per 24 hour  Intake  1988.79 ml  Output 2625 ml  Net -636.21 ml    Filed Weights   08/28/20 1922 08/29/20 0100 08/30/20 0501  Weight: 98.4 kg 97.1 kg 99.1 kg    Examination:  General:  Pleasantly resting in bed, No acute distress. HEENT:  Normocephalic atraumatic.  Sclerae nonicteric, noninjected.  Extraocular movements intact bilaterally. NG to suction - bright green fluid in container Neck:  Without mass or deformity.  Trachea is midline. Lungs:  Clear to auscultate bilaterally without rhonchi, wheeze, or rales. Heart:  Regular rate and rhythm.  Without murmurs,  rubs, or gallops. Abdomen:  Soft, nontender, minimally distended, notable scars consistent with surgical history.  Without guarding or rebound. Extremities: Without cyanosis, clubbing, edema, or obvious deformity. Vascular:  Dorsalis pedis and posterior tibial pulses palpable bilaterally. Skin:  Warm and dry, no erythema, no ulcerations.  Data Reviewed: I have personally reviewed following labs and imaging studies  CBC: Recent Labs  Lab 08/28/20 1931 08/29/20 0241 08/30/20 0031  WBC 6.2 10.6* 12.2*  HGB 15.6 15.4 16.5  HCT 48.2 47.1 50.2  MCV 92.0 90.6 90.1  PLT 192 181 962    Basic Metabolic Panel: Recent Labs  Lab 08/28/20 1931 08/29/20 0241 08/30/20 0031  NA 140 138 136  K 4.0 4.3 3.8  CL 105 106 100  CO2 28 25 25   GLUCOSE 97 122* 117*  BUN 17 16 14   CREATININE 0.86 0.85 0.98  CALCIUM 9.2 9.3 9.4    GFR: Estimated Creatinine Clearance: 73.3 mL/min (by C-G formula based on SCr of 0.98 mg/dL). Liver Function Tests: Recent Labs  Lab 08/28/20 1931  AST 24  ALT 18  ALKPHOS 52  BILITOT 1.2  PROT 6.8  ALBUMIN 4.2    Recent Labs  Lab 08/28/20 1931  LIPASE 15    No results for input(s): AMMONIA in the last 168 hours. Coagulation Profile: No results for input(s): INR, PROTIME in the last 168 hours. Cardiac Enzymes: No results for input(s): CKTOTAL, CKMB, CKMBINDEX, TROPONINI in the last 168 hours. BNP (last 3 results) No results for input(s): PROBNP in the last 8760 hours. HbA1C: No results for input(s): HGBA1C in the last 72 hours. CBG: No results for input(s): GLUCAP in the last 168 hours. Lipid Profile: No results for input(s): CHOL, HDL, LDLCALC, TRIG, CHOLHDL, LDLDIRECT in the last 72 hours. Thyroid Function Tests: No results for input(s): TSH, T4TOTAL, FREET4, T3FREE, THYROIDAB in the last 72 hours. Anemia Panel: No results for input(s): VITAMINB12, FOLATE, FERRITIN, TIBC, IRON, RETICCTPCT in the last 72 hours. Sepsis Labs: Recent Labs  Lab  08/28/20 2322 08/29/20 0215  LATICACIDVEN 1.9 1.7     Recent Results (from the past 240 hour(s))  Resp Panel by RT-PCR (Flu A&B, Covid)     Status: None   Collection Time: 08/28/20 11:22 PM   Specimen: Nasopharyngeal(NP) swabs in vial transport medium  Result Value Ref Range Status   SARS Coronavirus 2 by RT PCR NEGATIVE NEGATIVE Final    Comment: (NOTE) SARS-CoV-2 target nucleic acids are NOT DETECTED.  The SARS-CoV-2 RNA is generally detectable in upper respiratory specimens during the acute phase of infection. The lowest concentration of SARS-CoV-2 viral copies this assay can detect is 138 copies/mL. A negative result does not preclude SARS-Cov-2 infection and should not be used as the sole basis for treatment or other patient management decisions. A negative result may occur with  improper specimen collection/handling, submission of specimen other than nasopharyngeal swab, presence of viral mutation(s) within the areas targeted  by this assay, and inadequate number of viral copies(<138 copies/mL). A negative result must be combined with clinical observations, patient history, and epidemiological information. The expected result is Negative.  Fact Sheet for Patients:  EntrepreneurPulse.com.au  Fact Sheet for Healthcare Providers:  IncredibleEmployment.be  This test is no t yet approved or cleared by the [place] FDA and  has been authorized for detection and/or diagnosis of SARS-CoV-2 by FDA under an Emergency Use Authorization (EUA). This EUA will remain  in effect (meaning this test can be used) for the duration of the COVID-19 declaration under Section 564(b)(1) of the Act, 21 U.S.C.section 360bbb-3(b)(1), unless the authorization is terminated  or revoked sooner.       Influenza A by PCR NEGATIVE NEGATIVE Final   Influenza B by PCR NEGATIVE NEGATIVE Final    Comment: (NOTE) The Xpert Xpress SARS-CoV-2/FLU/RSV plus assay is  intended as an aid in the diagnosis of influenza from Nasopharyngeal swab specimens and should not be used as a sole basis for treatment. Nasal washings and aspirates are unacceptable for Xpert Xpress SARS-CoV-2/FLU/RSV testing.  Fact Sheet for Patients: EntrepreneurPulse.com.au  Fact Sheet for Healthcare Providers: IncredibleEmployment.be  This test is not yet approved or cleared by the [place] FDA and has been authorized for detection and/or diagnosis of SARS-CoV-2 by FDA under an Emergency Use Authorization (EUA). This EUA will remain in effect (meaning this test can be used) for the duration of the COVID-19 declaration under Section 564(b)(1) of the Act, 21 U.S.C. section 360bbb-3(b)(1), unless the authorization is terminated or revoked.  Performed at [facility], [address] [place], [place] [address]           Radiology Studies: CT Abdomen Pelvis W Contrast  Result Date: 08/28/2020 CLINICAL DATA:  Diverticulitis suspected. Sudden onset abdominal pain. EXAM: CT ABDOMEN AND PELVIS WITH CONTRAST TECHNIQUE: Multidetector CT imaging of the abdomen and pelvis was performed using the standard protocol following bolus administration of intravenous contrast. CONTRAST:  197mL OMNIPAQUE IOHEXOL 300 MG/ML  SOLN COMPARISON:  CT abdomen 04/01/14 FINDINGS: Lower chest: Lung bases are clear. Hepatobiliary: No focal hepatic lesion. No biliary duct dilatation. Common bile duct is normal. Pancreas: Pancreas is normal. No ductal dilatation. No pancreatic inflammation. Spleen: Normal spleen Adrenals/urinary tract: Adrenal glands and kidneys normal. Small cortical lesion in the RIGHT kidney is not significant changed from CT 2016. There are multiple diverticula of the bladder along the posterior and anterior wall. Stomach/Bowel: Small hiatal hernia.  Stomach and duodenum normal. A loop of bowel in the LEFT upper quadrant is  fluid-filled and has mild inflammatory stranding adjacent to loops (image 39/2). There is air-fluid level within these loops. The loops are mildly dilated up to 3.5 cm (image 39/2.). The bowel upstream and downstream of this dilated loop are small in caliber and the loop has a folded pattern on sagittal imaging (118/6) which could indicate close loop obstruction anatomy. Downstream small bowel is normal. Appendix normal. Ascending transverse colon normal. There are scattered diverticula of the transverse colon. There is descending colon is collapsed. No diverticulitis of the descending colon. Rectosigmoid colon normal. Vascular/Lymphatic: Abdominal aorta is normal caliber. No periportal or retroperitoneal adenopathy. No pelvic adenopathy. Reproductive: Other: No free fluid. Musculoskeletal: No aggressive osseous lesion. IMPRESSION: 1. Inflamed loop of small bowel LEFT upper quadrant with potential closed loop anatomy. This could progress to high-grade obstruction. Recommend surgical consultation. 2. No diverticulitis. 3. Multiple bladder diverticula. Electronically Signed   By: Helane Gunther.D.  On: 08/28/2020 21:07   DG Abd Portable 1V-Small Bowel Obstruction Protocol-initial, 8 hr delay  Result Date: 08/29/2020 CLINICAL DATA:  Small-bowel obstruction EXAM: PORTABLE ABDOMEN - 1 VIEW COMPARISON:  08/29/2020 FINDINGS: Three supine frontal views of the abdomen and pelvis are obtained after the administration of Gastroview approximately 10 hours prior to imaging. Enteric catheter is seen within the gastric lumen. There is significant retention oral contrast within the stomach. Slow diffusion of oral contrast is seen filling dilated loops of small bowel within the lower central abdomen and pelvis. No evidence of transit of contrast into the colon at the time of imaging. Excreted contrast is seen within the urinary bladder. IMPRESSION: 1. Dilute oral contrast filling multiple dilated loops of small bowel  throughout the lower abdomen and pelvis, compatible with small-bowel obstruction. No contrast identified within the colon at the time of imaging. 2. Significant retention of oral contrast within the stomach, with indwelling enteric catheter as above. Electronically Signed   By: Randa Ngo M.D.   On: 08/29/2020 15:49   DG Abd Portable 1V  Result Date: 08/29/2020 CLINICAL DATA:  80 year old male NG tube placement. EXAM: PORTABLE ABDOMEN - 1 VIEW COMPARISON:  CT Abdomen and Pelvis 08/28/2020. FINDINGS: Portable AP supine view at 0411 hours. Enteric tube placed into the stomach. Side hole is at the level of the gastric body. Stable visible bowel gas pattern from the recent CT. No acute cardiopulmonary abnormality identified. IMPRESSION: NG tube placed into the stomach, side hole at the gastric body. Electronically Signed   By: Genevie Ann M.D.   On: 08/29/2020 04:29     Scheduled Meds: Continuous Infusions:  sodium chloride 250 mL (08/30/20 0514)   cefTRIAXone (ROCEPHIN)  IV 1 g (08/30/20 0515)   heparin 1,350 Units/hr (08/29/20 1647)     LOS: 1 day   Time spent: 49min  Edrei Norgaard C Marquet Faircloth, DO [facility]  If 7PM-7AM, please contact night-coverage [email or web address]  08/30/2020, 7:32 AM

## 2020-08-31 ENCOUNTER — Inpatient Hospital Stay (HOSPITAL_COMMUNITY): Payer: Medicare HMO

## 2020-08-31 DIAGNOSIS — K56609 Unspecified intestinal obstruction, unspecified as to partial versus complete obstruction: Secondary | ICD-10-CM | POA: Diagnosis not present

## 2020-08-31 LAB — CBC
HCT: 50.2 % (ref 39.0–52.0)
Hemoglobin: 15.9 g/dL (ref 13.0–17.0)
MCH: 29.7 pg (ref 26.0–34.0)
MCHC: 31.7 g/dL (ref 30.0–36.0)
MCV: 93.7 fL (ref 80.0–100.0)
Platelets: 171 10*3/uL (ref 150–400)
RBC: 5.36 MIL/uL (ref 4.22–5.81)
RDW: 13.9 % (ref 11.5–15.5)
WBC: 11 10*3/uL — ABNORMAL HIGH (ref 4.0–10.5)
nRBC: 0 % (ref 0.0–0.2)

## 2020-08-31 LAB — BASIC METABOLIC PANEL
Anion gap: 12 (ref 5–15)
BUN: 19 mg/dL (ref 8–23)
CO2: 23 mmol/L (ref 22–32)
Calcium: 9.3 mg/dL (ref 8.9–10.3)
Chloride: 103 mmol/L (ref 98–111)
Creatinine, Ser: 0.92 mg/dL (ref 0.61–1.24)
GFR, Estimated: >60 mL/min (ref >60)
Glucose, Bld: 89 mg/dL (ref 70–99)
Potassium: 3.9 mmol/L (ref 3.5–5.1)
Sodium: 138 mmol/L (ref 135–145)

## 2020-08-31 LAB — HEPARIN LEVEL (UNFRACTIONATED): Heparin Unfractionated: 0.35 IU/mL (ref 0.30–0.70)

## 2020-08-31 IMAGING — DX DG ABD PORTABLE 1V
1 series · 1 of 1 positions shown · non-contrast
Comparison: None.

CLINICAL DATA: Nasogastric tube placement

EXAM:
PORTABLE ABDOMEN - 1 VIEW

[abdomen supine]
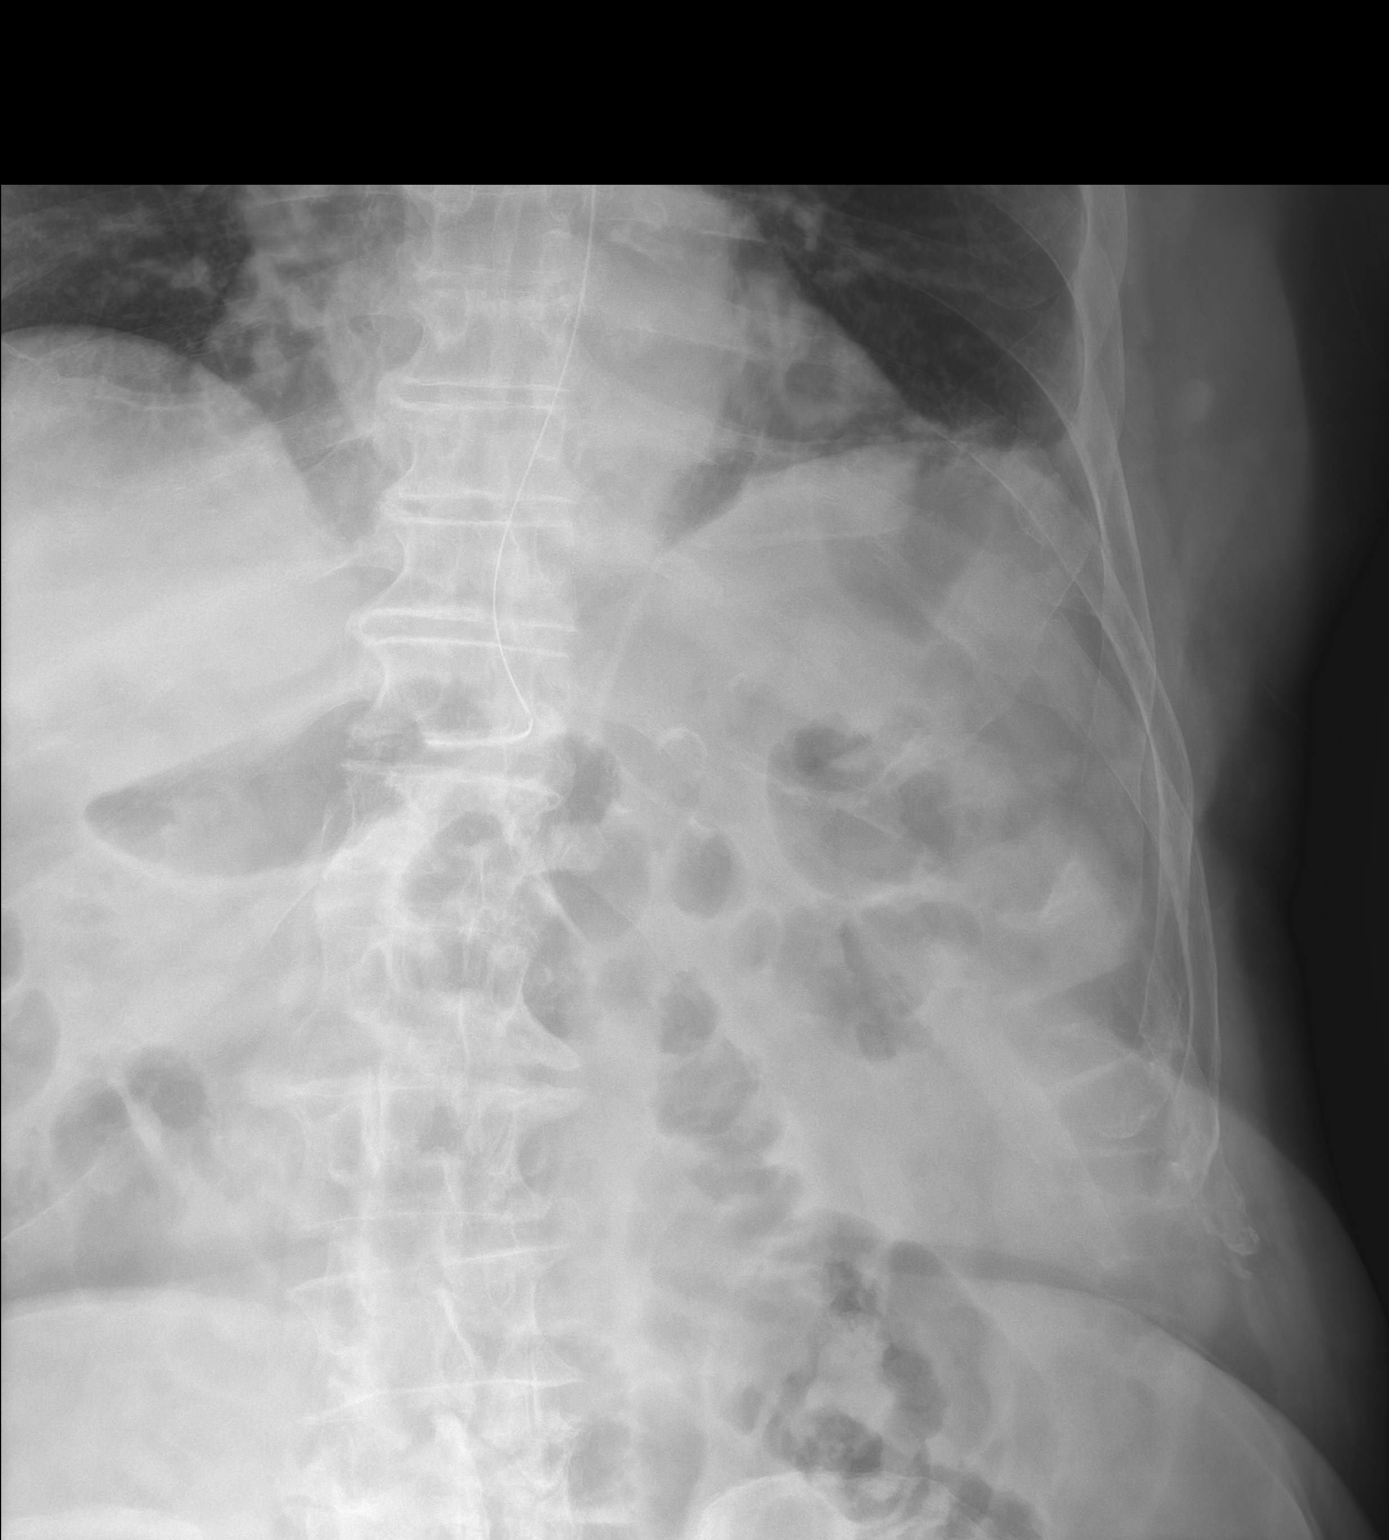

[1 of 1 positions shown; findings below may reference images not displayed]

FINDINGS: Nasogastric tube tip within the mid body of the stomach. Visualized
abdominal gas pattern is unremarkable. No gross free intraperitoneal
gas. Right flank and pelvis excluded from view.
IMPRESSION: Nasogastric tube tip within the mid body of the stomach.

## 2020-08-31 IMAGING — DX DG ABD PORTABLE 1V
2 series · 2 of 2 positions shown · non-contrast
Comparison: [DATE]

CLINICAL DATA: Small bowel obstruction.

EXAM:
PORTABLE ABDOMEN - 1 VIEW

[abdomen supine (1 of 2)]
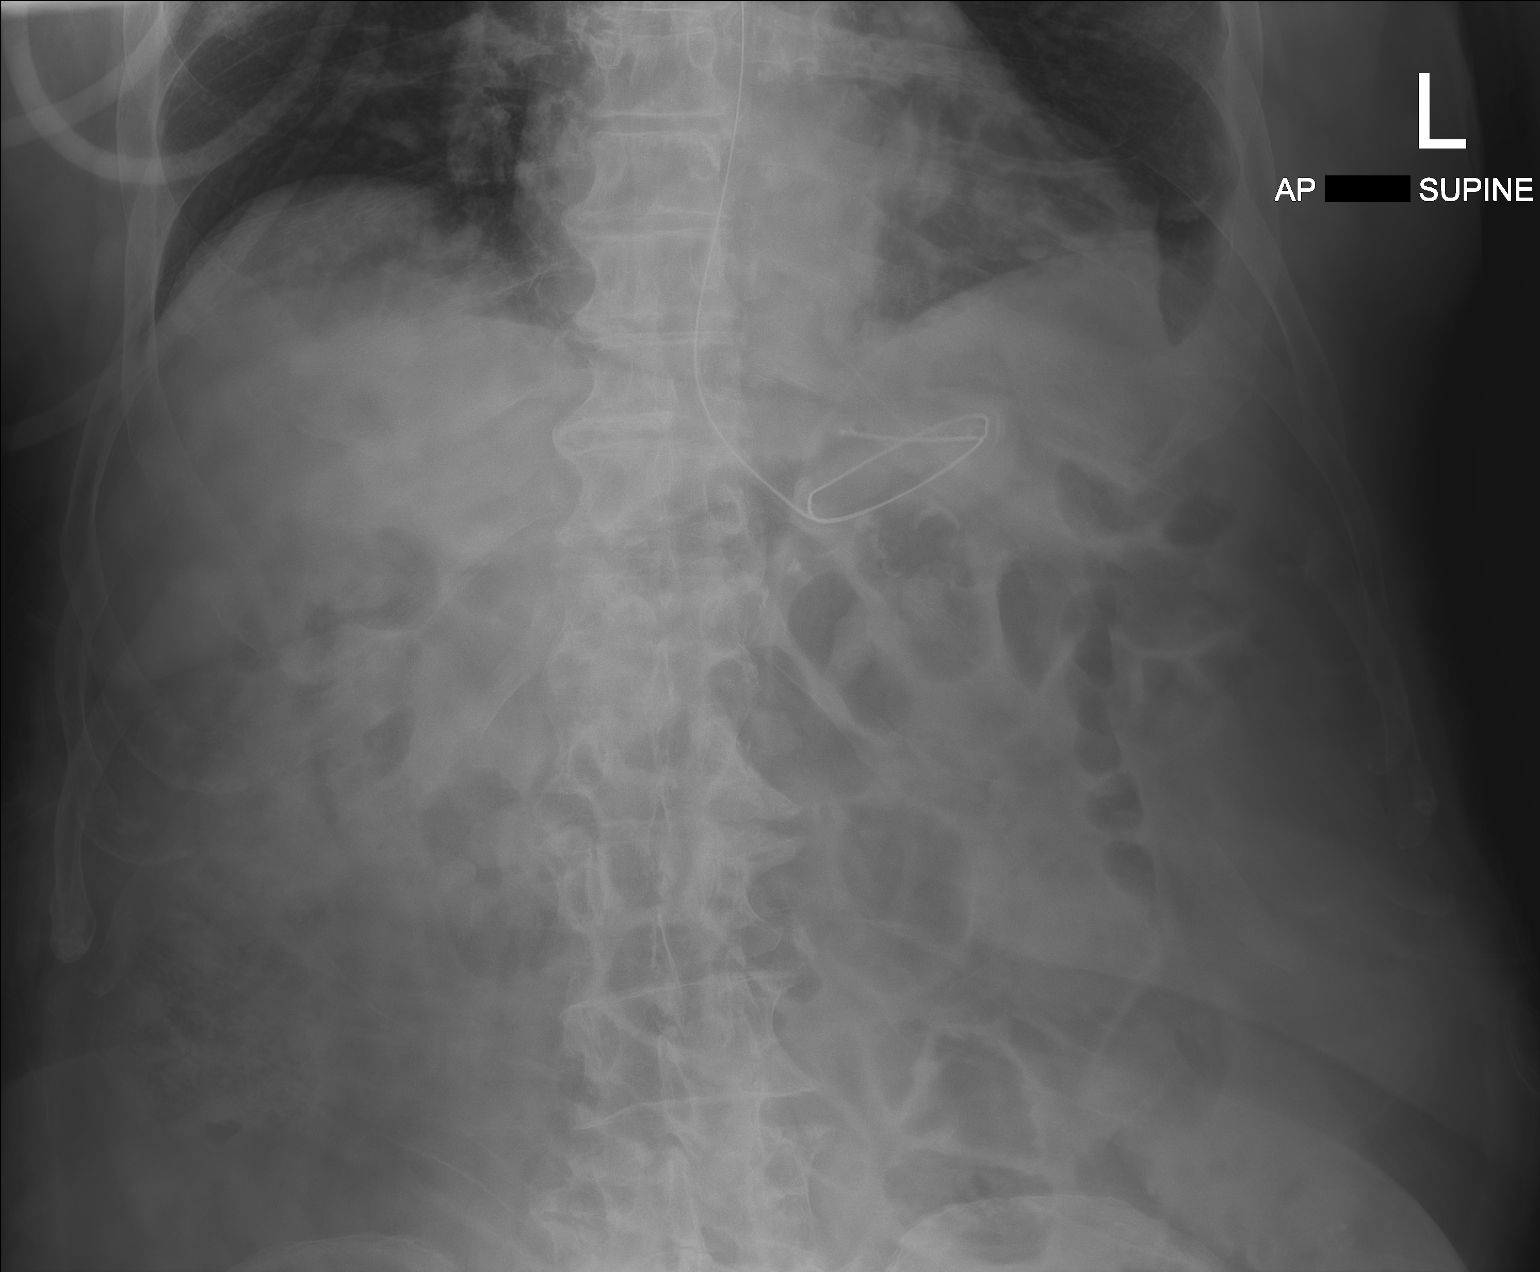

[abdomen supine (2 of 2)]
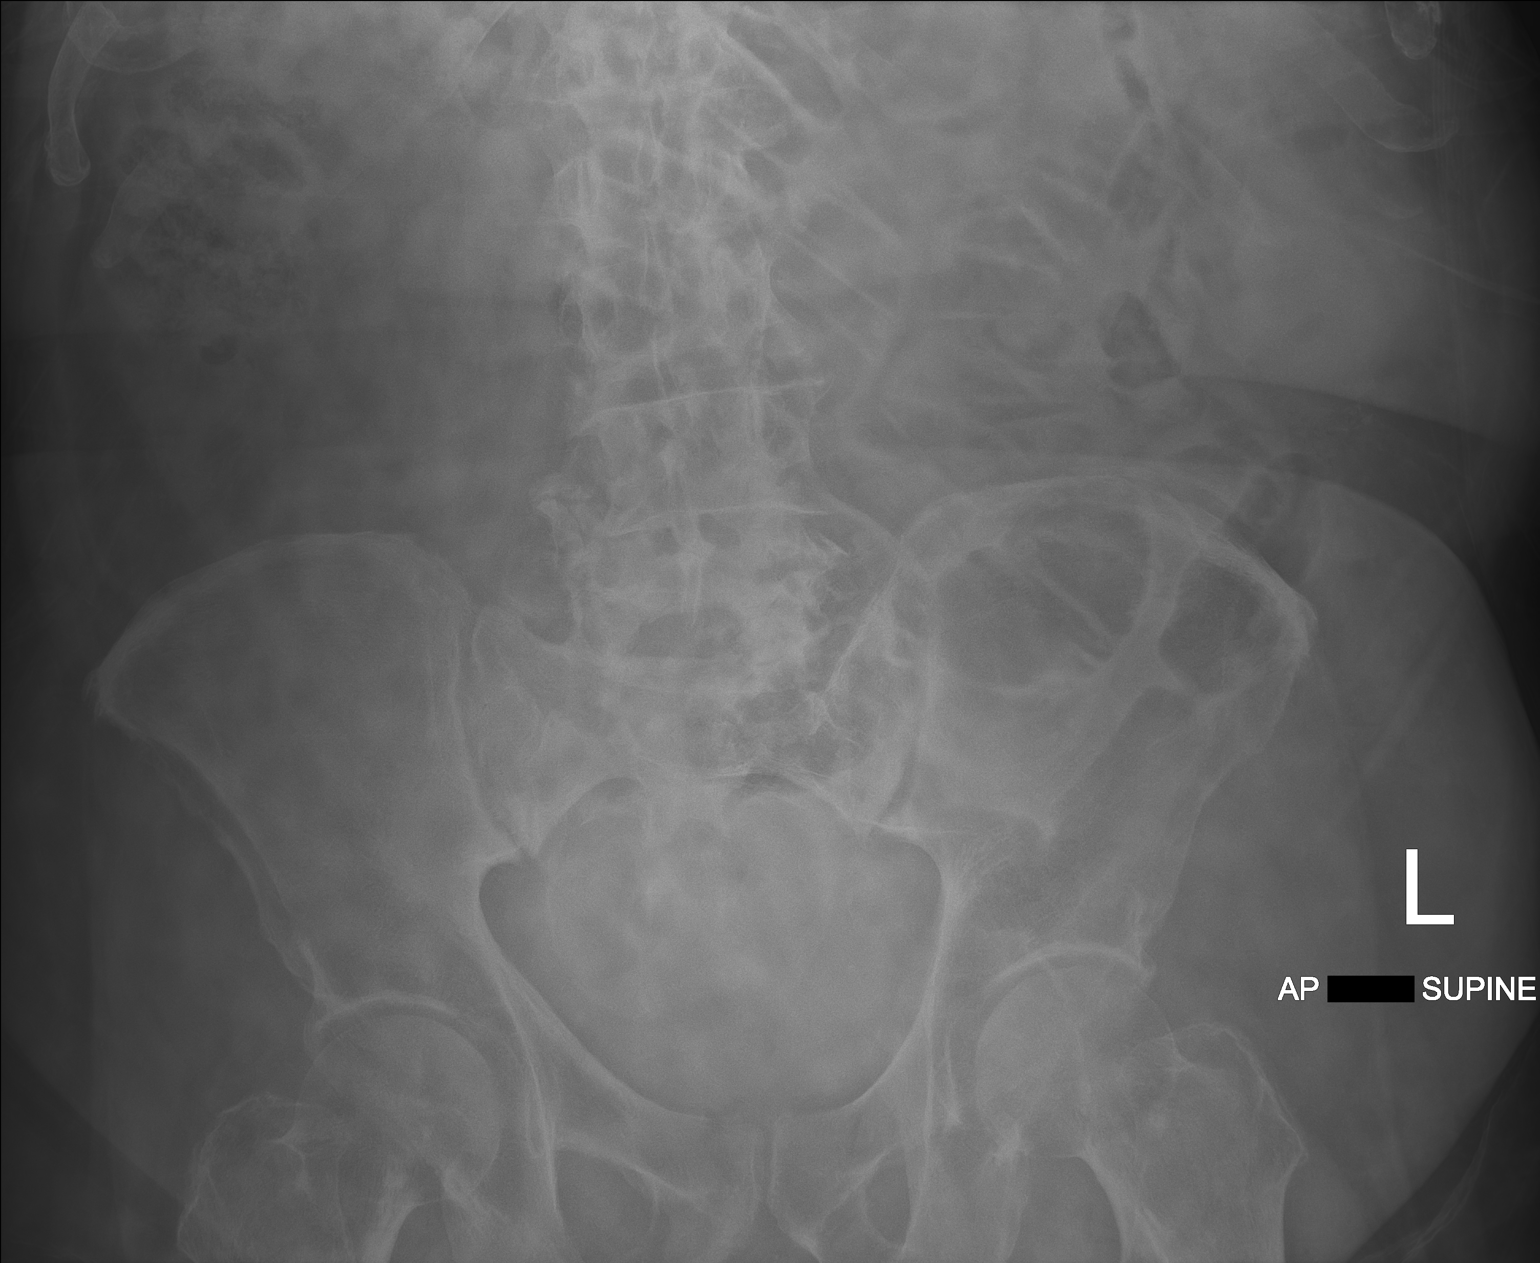

[2 of 2 positions shown; findings below may reference images not displayed]

FINDINGS: An enteric tube remains looped in the region of the gastric fundus.
There are a few mildly dilated gas-filled loops of small bowel in
the left mid abdomen which are more conspicuous than on the prior
study. Gas is present in nondilated colon. Heterogeneous bibasilar
pulmonary airspace opacities are suboptimally evaluated due to
overpenetration.
IMPRESSION: Mildly dilated small bowel loops in the left mid abdomen which could
reflect partial obstruction.

## 2020-08-31 MED ORDER — MAGNESIUM CITRATE PO SOLN
0.5000 | Freq: Once | ORAL | Status: AC
  Administered 2020-08-31: 0.5
  Filled 2020-08-31: qty 296

## 2020-08-31 NOTE — Progress Notes (Signed)
Per Claiborne Billings, PA,  NGT pulled out 6cm.  Pt tolerated it well. No complaints

## 2020-08-31 NOTE — Progress Notes (Signed)
Progress Note     Subjective: Patient reports generalized abd pain. No flatus or BM. Has been ambulating some. Intermittent nausea but not severe.   Objective: Vital signs in last 24 hours: Temp:  [97.9 F (36.6 C)-98.5 F (36.9 C)] 98.5 F (36.9 C) (07/18 0914) Pulse Rate:  [77-107] 107 (07/18 0914) Resp:  [18-20] 18 (07/18 0914) BP: (155-168)/(93-97) 168/97 (07/18 0914) SpO2:  [90 %-95 %] 95 % (07/18 0914) Last BM Date: 08/28/20  Intake/Output from previous day: 07/17 0701 - 07/18 0700 In: 528.7 [P.O.:240; I.V.:288.7] Out: 1475 [Urine:725; Emesis/NG output:750] Intake/Output this shift: Total I/O In: 0  Out: 325 [Urine:325]  PE: General: pleasant, WD, WN male who is laying in bed in NAD Heart: regular, rate, and rhythm.   Lungs: CTAB, no wheezes, rhonchi, or rales noted.  Respiratory effort nonlabored Abd: soft, mild generalized ttp without peritonitis, ND, +BS, prior midline surgical scar, NGT with bilious drainage  MS: all 4 extremities are symmetrical with no cyanosis, clubbing, or edema. Skin: warm and dry with no masses, lesions, or rashes Psych: A&Ox3 with an appropriate affect.    Lab Results:  Recent Labs    08/30/20 0031 08/31/20 0521  WBC 12.2* 11.0*  HGB 16.5 15.9  HCT 50.2 50.2  PLT 205 171   BMET Recent Labs    08/30/20 0031 08/31/20 0521  NA 136 138  K 3.8 3.9  CL 100 103  CO2 25 23  GLUCOSE 117* 89  BUN 14 19  CREATININE 0.98 0.92  CALCIUM 9.4 9.3   PT/INR No results for input(s): LABPROT, INR in the last 72 hours. CMP     Component Value Date/Time   NA 138 08/31/2020 0521   NA 141 12/03/2019 1509   K 3.9 08/31/2020 0521   CL 103 08/31/2020 0521   CO2 23 08/31/2020 0521   GLUCOSE 89 08/31/2020 0521   GLUCOSE 94 01/10/2006 1108   BUN 19 08/31/2020 0521   BUN 10 12/03/2019 1509   CREATININE 0.92 08/31/2020 0521   CREATININE 0.84 09/30/2019 1349   CALCIUM 9.3 08/31/2020 0521   PROT 6.8 08/28/2020 1931   PROT 6.5  12/03/2019 1509   ALBUMIN 4.2 08/28/2020 1931   ALBUMIN 4.2 12/03/2019 1509   AST 24 08/28/2020 1931   ALT 18 08/28/2020 1931   ALKPHOS 52 08/28/2020 1931   BILITOT 1.2 08/28/2020 1931   BILITOT 0.9 12/03/2019 1509   GFRNONAA >60 08/31/2020 0521   GFRAA 97 12/03/2019 1509   Lipase     Component Value Date/Time   LIPASE 15 08/28/2020 1931       Studies/Results: DG Abd 1 View  Result Date: 08/30/2020 CLINICAL DATA:  80 year old male status post nasogastric tube placement. EXAM: ABDOMEN - 1 VIEW COMPARISON:  08/29/2020. FINDINGS: Nasogastric tube extends into the proximal stomach with side port at or immediately distal to the gastroesophageal junction. Visualized bowel gas pattern is unremarkable. Visualized portions of the lungs suggest a background of interstitial pulmonary edema. Mild cardiomegaly. IMPRESSION: 1. Tip of nasogastric tube is in the proximal stomach and could be advanced approximately 10 cm for more optimal placement. 2. Chest is incompletely imaged, but visualized portions suggests underlying congestive heart failure. Follow-up chest radiograph is recommended. Electronically Signed   By: [name] M.D.   On: 08/30/2020 07:50   DG Abd Portable 1V  Result Date: 08/31/2020 CLINICAL DATA:  Small bowel obstruction. EXAM: PORTABLE ABDOMEN - 1 VIEW COMPARISON:  08/30/2020 FINDINGS: An enteric tube remains looped in  the region of the gastric fundus. There are a few mildly dilated gas-filled loops of small bowel in the left mid abdomen which are more conspicuous than on the prior study. Gas is present in nondilated colon. Heterogeneous bibasilar pulmonary airspace opacities are suboptimally evaluated due to overpenetration. IMPRESSION: Mildly dilated small bowel loops in the left mid abdomen which could reflect partial obstruction. Electronically Signed   By: Logan Bores M.D.   On: 08/31/2020 08:45   DG Abd Portable 1V  Result Date: 08/30/2020 CLINICAL DATA:  Nasogastric  tube replacement EXAM: PORTABLE ABDOMEN - 1 VIEW COMPARISON:  Earlier film of the same day FINDINGS: Nasogastric tube loops in the decompressed gastric fundus. A few gas filled mid abdominal small bowel loops. The right lateral and inferior abdomen are excluded. IMPRESSION: Nasogastric tube loops in the gastric fundus. Electronically Signed   By: Lucrezia Europe M.D.   On: 08/30/2020 13:42   DG Abd Portable 1V  Result Date: 08/30/2020 CLINICAL DATA:  Small-bowel obstruction EXAM: PORTABLE ABDOMEN - 1 VIEW COMPARISON:  08/29/2020, 08/28/2020 FINDINGS: Enteric tube terminates within the gastric body. Abnormally dilated loop of small bowel within the left hemiabdomen measuring up to 4.5 cm in diameter, which appears to be thickened. Faint dilute contrast within small bowel, but not definitively seen within the colon. Excreted contrast within the urinary bladder. No gross pneumoperitoneum. IMPRESSION: Abnormally dilated loop of small bowel within the left hemiabdomen, which appears to be thickened. Faint dilute contrast within small bowel, but not definitively seen within the colon. Findings concerning for worsening small bowel obstruction, possibly closed loop given appearance on previous CT. Electronically Signed   By: Davina Poke D.O.   On: 08/30/2020 10:23   DG Abd Portable 1V-Small Bowel Obstruction Protocol-initial, 8 hr delay  Result Date: 08/29/2020 CLINICAL DATA:  Small-bowel obstruction EXAM: PORTABLE ABDOMEN - 1 VIEW COMPARISON:  08/29/2020 FINDINGS: Three supine frontal views of the abdomen and pelvis are obtained after the administration of Gastroview approximately 10 hours prior to imaging. Enteric catheter is seen within the gastric lumen. There is significant retention oral contrast within the stomach. Slow diffusion of oral contrast is seen filling dilated loops of small bowel within the lower central abdomen and pelvis. No evidence of transit of contrast into the colon at the time of imaging.  Excreted contrast is seen within the urinary bladder. IMPRESSION: 1. Dilute oral contrast filling multiple dilated loops of small bowel throughout the lower abdomen and pelvis, compatible with small-bowel obstruction. No contrast identified within the colon at the time of imaging. 2. Significant retention of oral contrast within the stomach, with indwelling enteric catheter as above. Electronically Signed   By: Randa Ngo M.D.   On: 08/29/2020 15:49    Anti-infectives: Anti-infectives (From admission, onward)    Start     Dose/Rate Route Frequency Ordered Stop   08/29/20 0600  cefTRIAXone (ROCEPHIN) 1 g in sodium chloride 0.9 % 100 mL IVPB        1 g 200 mL/hr over 30 Minutes Intravenous Every 24 hours 08/29/20 0204 08/31/20 0730   08/28/20 2300  piperacillin-tazobactam (ZOSYN) IVPB 3.375 g        3.375 g 100 mL/hr over 30 Minutes Intravenous  Once 08/28/20 2245 08/28/20 2342        Assessment/Plan Left upper quadrant abdominal pain Partial small bowel obstruction-inflamed small bowel loop - He has a remote history of perforated sigmoid diverticulitis and an incarcerated left inguinal hernia for which he underwent urgent sigmoid  colectomy with colostomy and and repair of a incarcerated left indirect inguinal hernia by Dr. Dalbert Batman in 2010.  He subsequently underwent reversal in 2011 with takedown and closure of colostomy, lysis of adhesions and repair of parastomal hernia with Stratus mesh. - CT scan noted possible closed loop. Was not felt to have surgical abdomen on admission.  - SBO protocol - gastrografin was given 7/16 AM. Patient has not passed anything but film this AM reports mildly dilated loops in left abdomen with psbo. Gas present in colon - unable to review image myself currently - abd is soft although patient reports continued pain - no indication for emergent surgical intervention at this point, will continue to try to review films today  - Keep K > 4 and Mg >2 for bowel  function - Mobilize for bowel function - Hopefully patient improves with conservative measures. Discussed that should he clinically worsen and/or fail to improve then he may need exploratory surgery   FEN - NPO, NGT, IVF per [facility] VTE - SCDs, heparin gtt ID - Rocephin for UTI per [facility]   A. Fib on Eliquis - on hold  History of CVA January 2022 UTI Hypertension Hyperlipidemia  LOS: 2 days    Norm Parcel, [facility] Surgery 08/31/2020, 9:56 AM Please see Amion for pager number during day hours 7:00am-4:30pm

## 2020-08-31 NOTE — Progress Notes (Signed)
[place] for apixaban>> heparin Indication: atrial fibrillation and bridging while NPO for SBO  Allergies  Allergen Reactions   Other Other (See Comments)    Some form of numbing medication used by dentist, almost died   Simvastatin Other (See Comments)    Muscle aches     Patient Measurements: Height: 6' (182.9 cm) Weight: 99.1 kg (218 lb 7.6 oz) IBW/kg (Calculated) : 77.6 Heparin Dosing Weight: 97.4 kg   Vital Signs: Temp: 98.5 F (36.9 C) (07/18 0914) Temp Source: Oral (07/18 0914) BP: 168/97 (07/18 0914) Pulse Rate: 107 (07/18 0914)  Labs: Recent Labs    08/29/20 0241 08/30/20 0031 08/30/20 0715 08/31/20 0521 08/31/20 0655  HGB 15.4 16.5  --  15.9  --   HCT 47.1 50.2  --  50.2  --   PLT 181 205  --  171  --   APTT  --  69* 68*  --   --   HEPARINUNFRC  --  0.64  --   --  0.35  CREATININE 0.85 0.98  --  0.92  --      Estimated Creatinine Clearance: 78.1 mL/min (by C-G formula based on SCr of 0.92 mg/dL).   Assessment: 68 yof with known hx of Afib presented with ab pain - now found to have SBO and is NPO, requiring parenteral anticoagulation for atrial fibrillation (CHADSVASC 6). Last dose apixaban on 7/15@1200 .   Heparin level therapeutic at 0.35 on 1350 units/hr. CBC stable. No signs of bleeding.   Goal of Therapy:  Heparin level 0.3-0.7 units/ml Monitor platelets by anticoagulation protocol: Yes   Plan:  Continue heparin infusion at 1350 units/hr Daily heparin level, and CBC  Laurey Arrow, PharmD PGY1 Pharmacy Resident 08/31/2020  9:46 AM  Please check [email or web address] for unit-specific pharmacy phone numbers.

## 2020-08-31 NOTE — Progress Notes (Signed)
PROGRESS NOTE    William Norman  OVF:[id number] DOB: 07-25-1940 DOA: 08/28/2020 PCP: Vivi Barrack, MD   Brief Narrative:  William Norman is a 80 y.o. male with medical history significant for atrial fibrillation on Eliquis, CAD, history of CVA, hypertension, perforated bowel in 2010, colostomy reversal in 2011, and SBO in 2016 that resolved with conservative management, now presenting with abdominal pain. He had been in his usual state of health and normal BM the am of 08/28/20 before developing acute-onset of abdominal pain at approximately 2 pm. Pain was initially "6/10" and aching in character but he went on to develop fleeting, sharp, "10/10" pains every 30 seconds or so. He reports associated nausea without vomiting and abdominal distension. He denies fevers, diarrhea, chest pain, cough, or SOB. Last dose of Eliquis was the morning of 08/28/20.   Assessment & Plan:   Principal Problem:   SBO (small bowel obstruction) (HCC) Active Problems:   Essential hypertension   Personal history of DVT (deep vein thrombosis)   Persistent atrial fibrillation (HCC)   CAD (coronary artery disease)   History of ischemic stroke   Recurrent UTI  SBO, ongoing  -Patient being followed by general surgery, appreciate insight recommendations -repeat imaging shows ongoing dilated loops of small bowel with little contrast penetrance (none noted in the colon) -Current plan for conservative management with NG tube, follow for bowel movement flatus, but none since Friday morning per the patient -Abdominal history complicated by bowel perforation in 2010, colostomy reversal in 2011, and SBO in 2016 that resolved with conservative management    UTI, POA, does not meet sepsis criteria, resolved -Abnormal UA questionable symptoms of increased frequency -3 days of ceftriaxone completed  Atrial fibrillation, rate controlled -CHADS-VASc 6  - Last dose of Eliquis was am of 08/28/20  - Continue IV  heparin  History of CAD  - No anginal complaints  - Resume statin and beta-blocker once his condition allows     History of CVA  - Resume statin and Eliquis once his condition allows     Hypertension  -Currently normotensive    DVT prophylaxis: Heparin Code Status: Full Family Communication: None present in  Status is: Patient  Dispo: The patient is from: Home              Anticipated d/c is to: Home              Anticipated d/c date is: >72 hours pending resolution of obstruction              Patient currently not medically stable for discharge  Consultants:  General surgery  Procedures:  None  Antimicrobials:  Ceftriaxone x3 days  Subjective: No acute issues or events overnight denies nausea vomiting diarrhea constipation headache fevers chills chest pain shortness of breath.  Denies any ongoing flatus or bowel movement, some mild abdominal pain overnight but resolved quickly with narcotics  Objective: Vitals:   08/30/20 0938 08/30/20 0944 08/30/20 2110 08/31/20 0527  BP: (!) 148/98 140/73 (!) 157/94 (!) 155/93  Pulse: 86  77 83  Resp: 18 18 19 20   Temp: 97.9 F (36.6 C) 97.7 F (36.5 C) 97.9 F (36.6 C) 98.1 F (36.7 C)  TempSrc: Oral Oral Oral Oral  SpO2: 94% 96% 90% 93%  Weight:      Height:        Intake/Output Summary (Last 24 hours) at 08/31/2020 0810 Last data filed at 08/31/2020 0419 Gross per 24 hour  Intake 528.69  ml  Output 1475 ml  Net -946.31 ml    Filed Weights   08/28/20 1922 08/29/20 0100 08/30/20 0501  Weight: 98.4 kg 97.1 kg 99.1 kg    Examination:  General:  Pleasantly resting in bed, No acute distress. HEENT:  Normocephalic atraumatic.  Sclerae nonicteric, noninjected.  Extraocular movements intact bilaterally. NG to suction - bright green fluid in container Neck:  Without mass or deformity.  Trachea is midline. Lungs:  Clear to auscultate bilaterally without rhonchi, wheeze, or rales. Heart:  Regular rate and rhythm.  Without  murmurs, rubs, or gallops. Abdomen:  Soft, nontender, minimally distended, notable scars consistent with surgical history.  Without guarding or rebound. Extremities: Without cyanosis, clubbing, edema, or obvious deformity. Vascular:  Dorsalis pedis and posterior tibial pulses palpable bilaterally. Skin:  Warm and dry, no erythema, no ulcerations.  Data Reviewed: I have personally reviewed following labs and imaging studies  CBC: Recent Labs  Lab 08/28/20 1931 08/29/20 0241 08/30/20 0031 08/31/20 0521  WBC 6.2 10.6* 12.2* 11.0*  HGB 15.6 15.4 16.5 15.9  HCT 48.2 47.1 50.2 50.2  MCV 92.0 90.6 90.1 93.7  PLT 192 181 205 409    Basic Metabolic Panel: Recent Labs  Lab 08/28/20 1931 08/29/20 0241 08/30/20 0031 08/31/20 0521  NA 140 138 136 138  K 4.0 4.3 3.8 3.9  CL 105 106 100 103  CO2 28 25 25 23   GLUCOSE 97 122* 117* 89  BUN 17 16 14 19   CREATININE 0.86 0.85 0.98 0.92  CALCIUM 9.2 9.3 9.4 9.3    GFR: Estimated Creatinine Clearance: 78.1 mL/min (by C-G formula based on SCr of 0.92 mg/dL). Liver Function Tests: Recent Labs  Lab 08/28/20 1931  AST 24  ALT 18  ALKPHOS 52  BILITOT 1.2  PROT 6.8  ALBUMIN 4.2    Recent Labs  Lab 08/28/20 1931  LIPASE 15    No results for input(s): AMMONIA in the last 168 hours. Coagulation Profile: No results for input(s): INR, PROTIME in the last 168 hours. Cardiac Enzymes: No results for input(s): CKTOTAL, CKMB, CKMBINDEX, TROPONINI in the last 168 hours. BNP (last 3 results) No results for input(s): PROBNP in the last 8760 hours. HbA1C: No results for input(s): HGBA1C in the last 72 hours. CBG: No results for input(s): GLUCAP in the last 168 hours. Lipid Profile: No results for input(s): CHOL, HDL, LDLCALC, TRIG, CHOLHDL, LDLDIRECT in the last 72 hours. Thyroid Function Tests: No results for input(s): TSH, T4TOTAL, FREET4, T3FREE, THYROIDAB in the last 72 hours. Anemia Panel: No results for input(s): VITAMINB12,  FOLATE, FERRITIN, TIBC, IRON, RETICCTPCT in the last 72 hours. Sepsis Labs: Recent Labs  Lab 08/28/20 2322 08/29/20 0215  LATICACIDVEN 1.9 1.7     Recent Results (from the past 240 hour(s))  Urine Culture     Status: Abnormal   Collection Time: 08/28/20 11:22 PM   Specimen: Urine, Random  Result Value Ref Range Status   Specimen Description   Final    URINE, RANDOM Performed at [facility], [address], [place], [place] [address]    Special Requests   Final    NONE Performed at [facility], [address] [place], [place] [address]    Culture MULTIPLE SPECIES PRESENT, SUGGEST RECOLLECTION (A)  Final   Report Status 08/30/2020 FINAL  Final  Resp Panel by RT-PCR (Flu A&B, Covid)     Status: None   Collection Time: 08/28/20 11:22 PM   Specimen: Nasopharyngeal(NP) swabs in vial  transport medium  Result Value Ref Range Status   SARS Coronavirus 2 by RT PCR NEGATIVE NEGATIVE Final    Comment: (NOTE) SARS-CoV-2 target nucleic acids are NOT DETECTED.  The SARS-CoV-2 RNA is generally detectable in upper respiratory specimens during the acute phase of infection. The lowest concentration of SARS-CoV-2 viral copies this assay can detect is 138 copies/mL. A negative result does not preclude SARS-Cov-2 infection and should not be used as the sole basis for treatment or other patient management decisions. A negative result may occur with  improper specimen collection/handling, submission of specimen other than nasopharyngeal swab, presence of viral mutation(s) within the areas targeted by this assay, and inadequate number of viral copies(<138 copies/mL). A negative result must be combined with clinical observations, patient history, and epidemiological information. The expected result is Negative.  Fact Sheet for Patients:  EntrepreneurPulse.com.au  Fact Sheet for Healthcare Providers:   IncredibleEmployment.be  This test is no t yet approved or cleared by the [place] FDA and  has been authorized for detection and/or diagnosis of SARS-CoV-2 by FDA under an Emergency Use Authorization (EUA). This EUA will remain  in effect (meaning this test can be used) for the duration of the COVID-19 declaration under Section 564(b)(1) of the Act, 21 U.S.C.section 360bbb-3(b)(1), unless the authorization is terminated  or revoked sooner.       Influenza A by PCR NEGATIVE NEGATIVE Final   Influenza B by PCR NEGATIVE NEGATIVE Final    Comment: (NOTE) The Xpert Xpress SARS-CoV-2/FLU/RSV plus assay is intended as an aid in the diagnosis of influenza from Nasopharyngeal swab specimens and should not be used as a sole basis for treatment. Nasal washings and aspirates are unacceptable for Xpert Xpress SARS-CoV-2/FLU/RSV testing.  Fact Sheet for Patients: EntrepreneurPulse.com.au  Fact Sheet for Healthcare Providers: IncredibleEmployment.be  This test is not yet approved or cleared by the [place] FDA and has been authorized for detection and/or diagnosis of SARS-CoV-2 by FDA under an Emergency Use Authorization (EUA). This EUA will remain in effect (meaning this test can be used) for the duration of the COVID-19 declaration under Section 564(b)(1) of the Act, 21 U.S.C. section 360bbb-3(b)(1), unless the authorization is terminated or revoked.  Performed at [facility], [address] [place], [place] [address]   Culture, blood (routine x 2)     Status: None (Preliminary result)   Collection Time: 08/29/20  2:15 AM   Specimen: BLOOD  Result Value Ref Range Status   Specimen Description BLOOD RIGHT ANTECUBITAL  Final   Special Requests   Final    BOTTLES DRAWN AEROBIC AND ANAEROBIC Blood Culture adequate volume   Culture   Final    NO GROWTH 2 DAYS Performed at [facility], [address], [place], [place] [address]    Report Status PENDING  Incomplete  Culture, blood (routine x 2)     Status: None (Preliminary result)   Collection Time: 08/29/20  2:22 AM   Specimen: BLOOD LEFT HAND  Result Value Ref Range Status   Specimen Description BLOOD LEFT HAND  Final   Special Requests   Final    BOTTLES DRAWN AEROBIC AND ANAEROBIC Blood Culture adequate volume   Culture   Final    NO GROWTH 2 DAYS Performed at [facility], [place] [address], [place], [place] [address]    Report Status PENDING  Incomplete   Radiology Studies: DG Abd 1 View  Result Date: 08/30/2020 CLINICAL DATA:  80 year old male  status post nasogastric tube placement. EXAM: ABDOMEN - 1 VIEW COMPARISON:  08/29/2020. FINDINGS: Nasogastric tube extends into the proximal stomach with side port at or immediately distal to the gastroesophageal junction. Visualized bowel gas pattern is unremarkable. Visualized portions of the lungs suggest a background of interstitial pulmonary edema. Mild cardiomegaly. IMPRESSION: 1. Tip of nasogastric tube is in the proximal stomach and could be advanced approximately 10 cm for more optimal placement. 2. Chest is incompletely imaged, but visualized portions suggests underlying congestive heart failure. Follow-up chest radiograph is recommended. Electronically Signed   By: [name] M.D.   On: 08/30/2020 07:50   DG Abd Portable 1V  Result Date: 08/30/2020 CLINICAL DATA:  Nasogastric tube replacement EXAM: PORTABLE ABDOMEN - 1 VIEW COMPARISON:  Earlier film of the same day FINDINGS: Nasogastric tube loops in the decompressed gastric fundus. A few gas filled mid abdominal small bowel loops. The right lateral and inferior abdomen are excluded. IMPRESSION: Nasogastric tube loops in the gastric fundus. Electronically Signed   By: Lucrezia Europe M.D.   On: 08/30/2020 13:42   DG Abd Portable 1V  Result Date: 08/30/2020 CLINICAL DATA:  Small-bowel obstruction EXAM: PORTABLE ABDOMEN -  1 VIEW COMPARISON:  08/29/2020, 08/28/2020 FINDINGS: Enteric tube terminates within the gastric body. Abnormally dilated loop of small bowel within the left hemiabdomen measuring up to 4.5 cm in diameter, which appears to be thickened. Faint dilute contrast within small bowel, but not definitively seen within the colon. Excreted contrast within the urinary bladder. No gross pneumoperitoneum. IMPRESSION: Abnormally dilated loop of small bowel within the left hemiabdomen, which appears to be thickened. Faint dilute contrast within small bowel, but not definitively seen within the colon. Findings concerning for worsening small bowel obstruction, possibly closed loop given appearance on previous CT. Electronically Signed   By: Davina Poke D.O.   On: 08/30/2020 10:23   DG Abd Portable 1V-Small Bowel Obstruction Protocol-initial, 8 hr delay  Result Date: 08/29/2020 CLINICAL DATA:  Small-bowel obstruction EXAM: PORTABLE ABDOMEN - 1 VIEW COMPARISON:  08/29/2020 FINDINGS: Three supine frontal views of the abdomen and pelvis are obtained after the administration of Gastroview approximately 10 hours prior to imaging. Enteric catheter is seen within the gastric lumen. There is significant retention oral contrast within the stomach. Slow diffusion of oral contrast is seen filling dilated loops of small bowel within the lower central abdomen and pelvis. No evidence of transit of contrast into the colon at the time of imaging. Excreted contrast is seen within the urinary bladder. IMPRESSION: 1. Dilute oral contrast filling multiple dilated loops of small bowel throughout the lower abdomen and pelvis, compatible with small-bowel obstruction. No contrast identified within the colon at the time of imaging. 2. Significant retention of oral contrast within the stomach, with indwelling enteric catheter as above. Electronically Signed   By: Randa Ngo M.D.   On: 08/29/2020 15:49     Scheduled Meds: Continuous  Infusions:  sodium chloride 250 mL (08/31/20 0522)   cefTRIAXone (ROCEPHIN)  IV Stopped (08/31/20 0730)   heparin 1,350 Units/hr (08/31/20 0520)     LOS: 2 days   Time spent: 22min  Makaya Juneau C Leilah Polimeni, DO [facility]  If 7PM-7AM, please contact night-coverage [email or web address]  08/31/2020, 8:10 AM

## 2020-09-01 ENCOUNTER — Inpatient Hospital Stay (HOSPITAL_COMMUNITY): Payer: Medicare HMO

## 2020-09-01 ENCOUNTER — Encounter (HOSPITAL_COMMUNITY)
Admission: EM | Disposition: A | Discharge status: Skilled Nursing Facility | Payer: Self-pay | Source: Home / Self Care | Attending: Internal Medicine

## 2020-09-01 DIAGNOSIS — K56609 Unspecified intestinal obstruction, unspecified as to partial versus complete obstruction: Secondary | ICD-10-CM | POA: Diagnosis not present

## 2020-09-01 LAB — CBC
HCT: 51.7 % (ref 39.0–52.0)
Hemoglobin: 16.8 g/dL (ref 13.0–17.0)
MCH: 29.5 pg (ref 26.0–34.0)
MCHC: 32.5 g/dL (ref 30.0–36.0)
MCV: 90.9 fL (ref 80.0–100.0)
Platelets: 202 10*3/uL (ref 150–400)
RBC: 5.69 MIL/uL (ref 4.22–5.81)
RDW: 13.7 % (ref 11.5–15.5)
WBC: 10.7 10*3/uL — ABNORMAL HIGH (ref 4.0–10.5)
nRBC: 0 % (ref 0.0–0.2)

## 2020-09-01 LAB — BASIC METABOLIC PANEL
Anion gap: 10 (ref 5–15)
BUN: 23 mg/dL (ref 8–23)
CO2: 29 mmol/L (ref 22–32)
Calcium: 9.6 mg/dL (ref 8.9–10.3)
Chloride: 101 mmol/L (ref 98–111)
Creatinine, Ser: 0.99 mg/dL (ref 0.61–1.24)
GFR, Estimated: >60 mL/min (ref >60)
Glucose, Bld: 88 mg/dL (ref 70–99)
Potassium: 3.3 mmol/L — ABNORMAL LOW (ref 3.5–5.1)
Sodium: 140 mmol/L (ref 135–145)

## 2020-09-01 LAB — SURGICAL PCR SCREEN
MRSA, PCR: NEGATIVE
Staphylococcus aureus: NEGATIVE

## 2020-09-01 LAB — PREALBUMIN: Prealbumin: 14.5 mg/dL — ABNORMAL LOW (ref 18–38)

## 2020-09-01 LAB — HEPARIN LEVEL (UNFRACTIONATED): Heparin Unfractionated: 0.42 IU/mL (ref 0.30–0.70)

## 2020-09-01 IMAGING — DX DG ABD PORTABLE 1V
2 series · 2 of 2 positions shown · non-contrast
Comparison: [DATE].  [DATE].  CT [DATE].

CLINICAL DATA: Small-bowel obstruction.

EXAM:
PORTABLE ABDOMEN - 1 VIEW

[abdomen supine (1 of 2)]
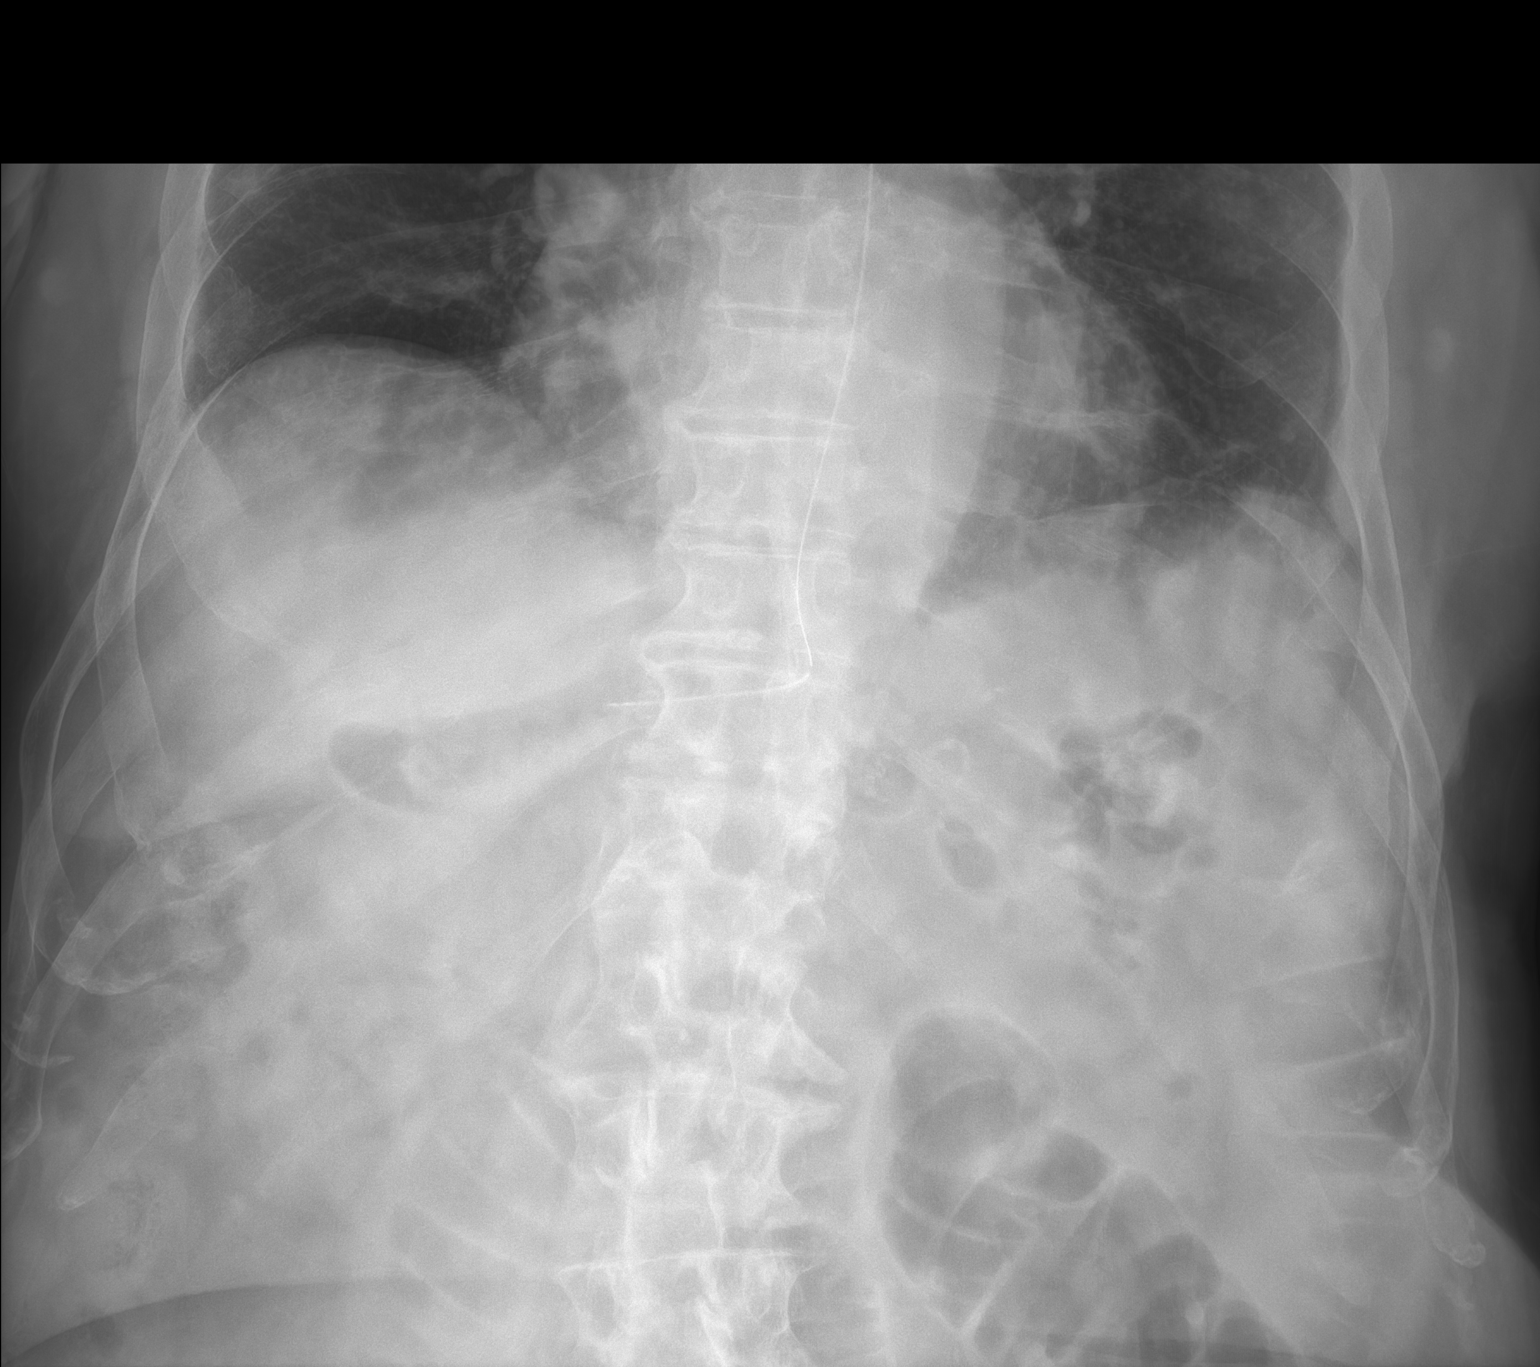

[abdomen supine (2 of 2)]
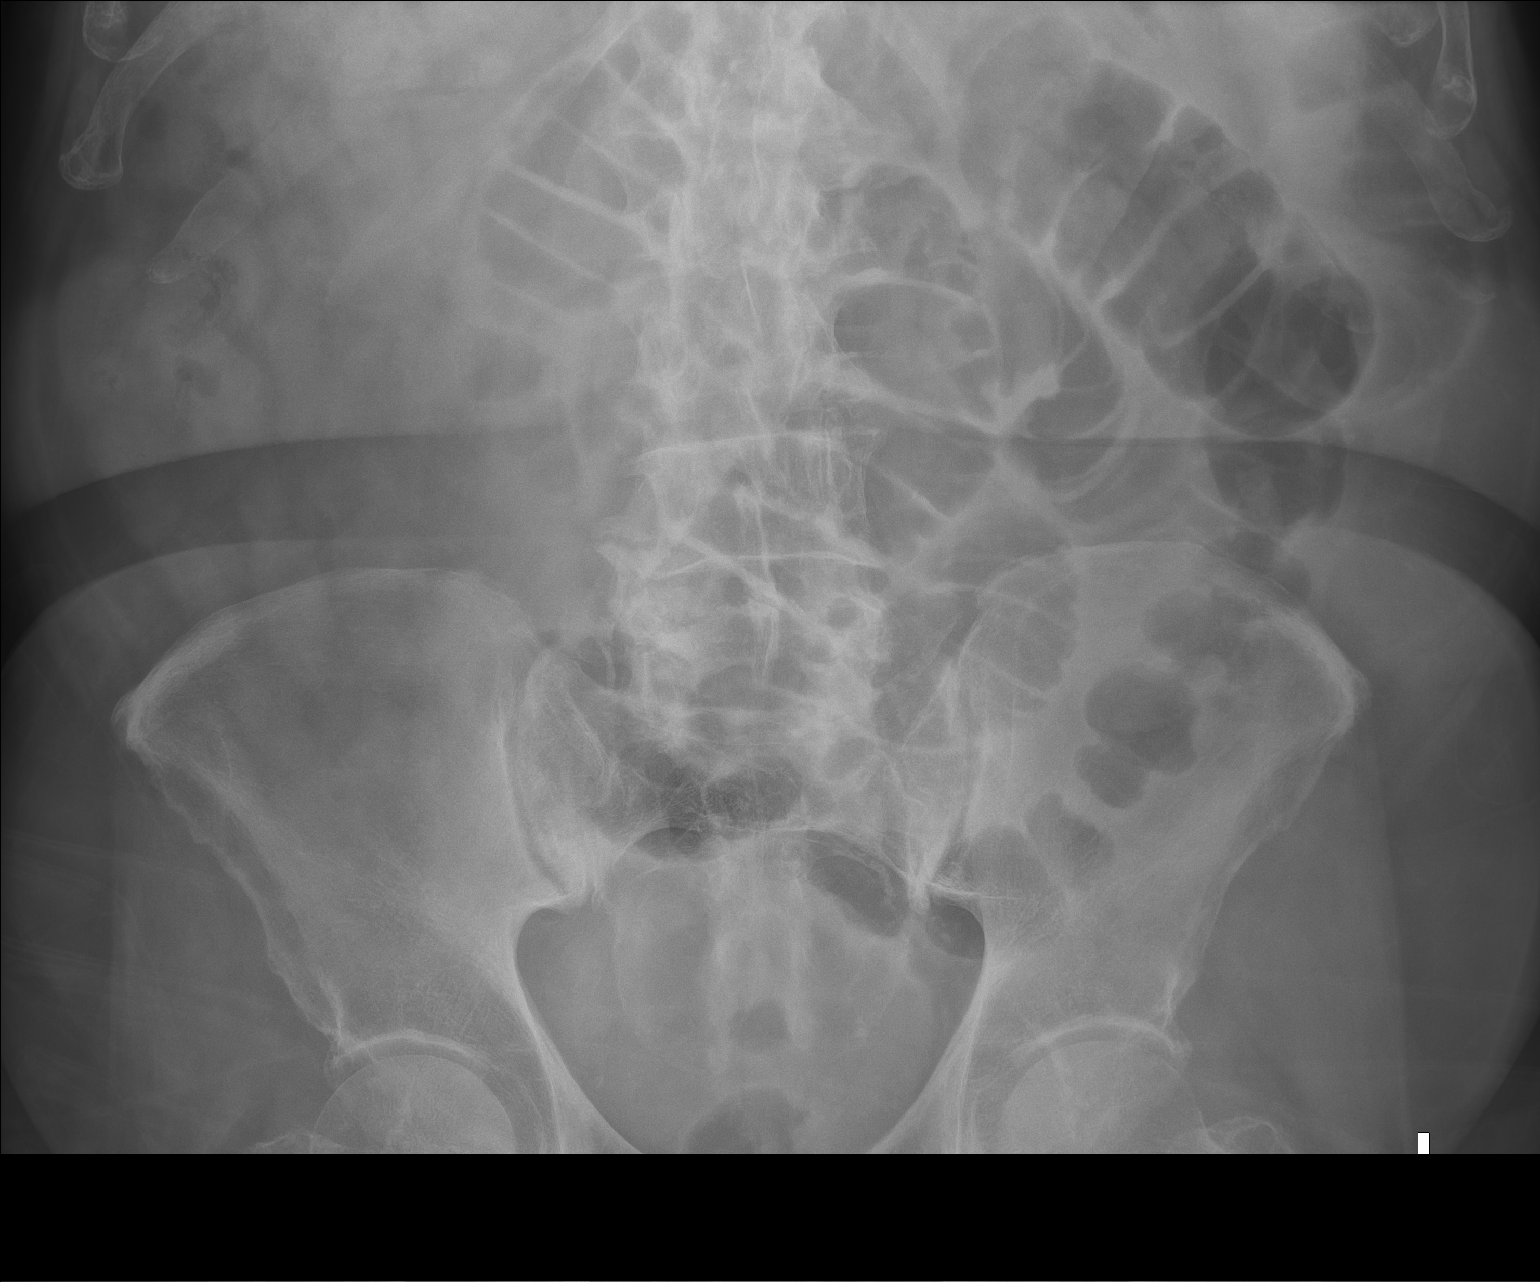

[2 of 2 positions shown; findings below may reference images not displayed]

FINDINGS: NG tube noted with tip in stable position over the upper stomach.
Progressive small-bowel distention. Small-bowel dilatation up to
cm noted. Colon is nondistended. No free air noted. No acute bony
abnormality.
IMPRESSION: 1. NG tube noted with tip in stable position over the upper stomach.

2. Progressive small-bowel distention. Findings again consistent
with small bowel obstruction.

## 2020-09-01 SURGERY — LAPAROTOMY, EXPLORATORY
Anesthesia: General

## 2020-09-01 MED ORDER — HEPARIN (PORCINE) 25000 UT/250ML-% IV SOLN
1350.0000 [IU]/h | INTRAVENOUS | Status: DC
  Administered 2020-09-01: 1350 [IU]/h via INTRAVENOUS
  Filled 2020-09-01 (×2): qty 250

## 2020-09-01 MED ORDER — METOCLOPRAMIDE HCL 5 MG/ML IJ SOLN
10.0000 mg | Freq: Four times a day (QID) | INTRAMUSCULAR | Status: DC
  Administered 2020-09-01 – 2020-09-02 (×4): 10 mg via INTRAVENOUS
  Filled 2020-09-01 (×3): qty 2

## 2020-09-01 MED ORDER — HEPARIN (PORCINE) IN NACL 2-0.9 UNITS/ML: INTRAMUSCULAR | Status: DC

## 2020-09-01 NOTE — Progress Notes (Signed)
PROGRESS NOTE    William Norman  DGL:[id number] DOB: [birth year] DOA: 08/28/2020 PCP: Vivi Barrack, MD   Brief Narrative:  William Norman is a 80 y.o. male with medical history significant for atrial fibrillation on Eliquis, CAD, history of CVA, hypertension, perforated bowel in 2010, colostomy reversal in 2011, and SBO in 2016 that resolved with conservative management, now presenting with abdominal pain. He had been in his usual state of health and normal BM the am of 08/28/20 before developing acute-onset of abdominal pain at approximately 2 pm. Pain was initially "6/10" and aching in character but he went on to develop fleeting, sharp, "10/10" pains every 30 seconds or so. He reports associated nausea without vomiting and abdominal distension. He denies fevers, diarrhea, chest pain, cough, or SOB. Last dose of Eliquis was the morning of 08/28/20.   Assessment & Plan:   Principal Problem:   SBO (small bowel obstruction) (HCC) Active Problems:   Essential hypertension   Personal history of DVT (deep vein thrombosis)   Persistent atrial fibrillation (HCC)   CAD (coronary artery disease)   History of ischemic stroke   Recurrent UTI  SBO, ongoing  - Patient being followed by general surgery, appreciate insight recommendations - Imaging appears stable if not somewhat worse but clinically patient states he feels improved with small amount of flatus this morning and overnight - Abdominal history complicated by bowel perforation in 2010, colostomy reversal in 2011, and SBO in 2016 that resolved with conservative management   UTI, POA, does not meet sepsis criteria, resolved -Abnormal UA questionable symptoms of increased frequency -3 days of ceftriaxone completed  Atrial fibrillation, rate controlled - CHADS-VASc 6  - Last dose of Eliquis was am of 08/28/20  - Continue IV heparin  History of CAD  - No anginal complaints  - Resume statin and beta-blocker once his  condition allows     History of CVA  - Resume statin and Eliquis once his condition allows     Hypertension  -Currently normotensive    DVT prophylaxis: Heparin Code Status: Full Family Communication: None present in  Status is: Patient  Dispo: The patient is from: Home              Anticipated d/c is to: Home              Anticipated d/c date is: >72 hours pending resolution of obstruction              Patient currently not medically stable for discharge  Consultants:  General surgery  Procedures:  None  Antimicrobials:  Ceftriaxone x3 days completed  Subjective: No acute issues or events overnight, indicates small amount of flatus this morning but denies nausea vomiting headache fever chills chest pain shortness of breath  Objective: Vitals:   08/31/20 0914 08/31/20 1723 08/31/20 2326 09/01/20 0457  BP: (!) 168/97 (!) 164/97 (!) 149/88 (!) 152/85  Pulse: (!) 107 89 81 80  Resp: 18 18 18 18   Temp: 98.5 F (36.9 C) 98.7 F (37.1 C) 98.5 F (36.9 C) 98.8 F (37.1 C)  TempSrc: Oral Oral Oral Oral  SpO2: 95% 92% 91% 91%  Weight:    96.5 kg  Height:        Intake/Output Summary (Last 24 hours) at 09/01/2020 0738 Last data filed at 09/01/2020 0600 Gross per 24 hour  Intake 373.51 ml  Output 4000 ml  Net -3626.49 ml    Filed Weights   08/29/20 0100 08/30/20 0501 09/01/20  0457  Weight: 97.1 kg 99.1 kg 96.5 kg    Examination:  General:  Pleasantly resting in bed, No acute distress. HEENT:  Normocephalic atraumatic.  Sclerae nonicteric, noninjected.  Extraocular movements intact bilaterally. NG to suction - bright green fluid in container Neck:  Without mass or deformity.  Trachea is midline. Lungs:  Clear to auscultate bilaterally without rhonchi, wheeze, or rales. Heart:  Regular rate and rhythm.  Without murmurs, rubs, or gallops. Abdomen:  Soft, nontender, minimally distended, notable scars consistent with surgical history.  Without guarding or  rebound. Extremities: Without cyanosis, clubbing, edema, or obvious deformity. Vascular:  Dorsalis pedis and posterior tibial pulses palpable bilaterally. Skin:  Warm and dry, no erythema, no ulcerations.  Data Reviewed: I have personally reviewed following labs and imaging studies  CBC: Recent Labs  Lab 08/28/20 1931 08/29/20 0241 08/30/20 0031 08/31/20 0521 09/01/20 0358  WBC 6.2 10.6* 12.2* 11.0* 10.7*  HGB 15.6 15.4 16.5 15.9 16.8  HCT 48.2 47.1 50.2 50.2 51.7  MCV 92.0 90.6 90.1 93.7 90.9  PLT 192 181 205 171 595    Basic Metabolic Panel: Recent Labs  Lab 08/28/20 1931 08/29/20 0241 08/30/20 0031 08/31/20 0521 09/01/20 0358  NA 140 138 136 138 140  K 4.0 4.3 3.8 3.9 3.3*  CL 105 106 100 103 101  CO2 28 25 25 23 29   GLUCOSE 97 122* 117* 89 88  BUN 17 16 14 19 23   CREATININE 0.86 0.85 0.98 0.92 0.99  CALCIUM 9.2 9.3 9.4 9.3 9.6    GFR: Estimated Creatinine Clearance: 71.7 mL/min (by C-G formula based on SCr of 0.99 mg/dL). Liver Function Tests: Recent Labs  Lab 08/28/20 1931  AST 24  ALT 18  ALKPHOS 52  BILITOT 1.2  PROT 6.8  ALBUMIN 4.2    Recent Labs  Lab 08/28/20 1931  LIPASE 15    No results for input(s): AMMONIA in the last 168 hours. Coagulation Profile: No results for input(s): INR, PROTIME in the last 168 hours. Cardiac Enzymes: No results for input(s): CKTOTAL, CKMB, CKMBINDEX, TROPONINI in the last 168 hours. BNP (last 3 results) No results for input(s): PROBNP in the last 8760 hours. HbA1C: No results for input(s): HGBA1C in the last 72 hours. CBG: No results for input(s): GLUCAP in the last 168 hours. Lipid Profile: No results for input(s): CHOL, HDL, LDLCALC, TRIG, CHOLHDL, LDLDIRECT in the last 72 hours. Thyroid Function Tests: No results for input(s): TSH, T4TOTAL, FREET4, T3FREE, THYROIDAB in the last 72 hours. Anemia Panel: No results for input(s): VITAMINB12, FOLATE, FERRITIN, TIBC, IRON, RETICCTPCT in the last 72  hours. Sepsis Labs: Recent Labs  Lab 08/28/20 2322 08/29/20 0215  LATICACIDVEN 1.9 1.7     Recent Results (from the past 240 hour(s))  Culture, blood (Routine X 2) w Reflex to ID Panel     Status: None (Preliminary result)   Collection Time: 08/28/20 10:50 PM   Specimen: BLOOD  Result Value Ref Range Status   Specimen Description BLOOD LEFT ANTECUBITAL  Final   Special Requests   Final    BOTTLES DRAWN AEROBIC AND ANAEROBIC Blood Culture adequate volume   Culture   Final    NO GROWTH 2 DAYS Performed at [facility], [place] [address], [place], [place] [address]    Report Status PENDING  Incomplete  Culture, blood (Routine X 2) w Reflex to ID Panel     Status: None (Preliminary result)   Collection Time: 08/28/20 11:00 PM   Specimen:  BLOOD  Result Value Ref Range Status   Specimen Description BLOOD FEMORAL ARTERY RIGHT  Final   Special Requests   Final    BOTTLES DRAWN AEROBIC AND ANAEROBIC Blood Culture adequate volume   Culture   Final    NO GROWTH 2 DAYS Performed at [facility] [place] [facility], [address] [place], [place] [address]    Report Status PENDING  Incomplete  Urine Culture     Status: Abnormal   Collection Time: 08/28/20 11:22 PM   Specimen: Urine, Random  Result Value Ref Range Status   Specimen Description   Final    URINE, RANDOM Performed at [facility], [address], [place], [place] [address]    Special Requests   Final    NONE Performed at [facility], [address] [place], [place] [address]    Culture MULTIPLE SPECIES PRESENT, SUGGEST RECOLLECTION (A)  Final   Report Status 08/30/2020 FINAL  Final  Resp Panel by RT-PCR (Flu A&B, Covid)     Status: None   Collection Time: 08/28/20 11:22 PM   Specimen: Nasopharyngeal(NP) swabs in vial transport medium  Result Value Ref Range Status   SARS Coronavirus 2 by RT PCR NEGATIVE NEGATIVE Final    Comment: (NOTE) SARS-CoV-2 target nucleic acids are NOT  DETECTED.  The SARS-CoV-2 RNA is generally detectable in upper respiratory specimens during the acute phase of infection. The lowest concentration of SARS-CoV-2 viral copies this assay can detect is 138 copies/mL. A negative result does not preclude SARS-Cov-2 infection and should not be used as the sole basis for treatment or other patient management decisions. A negative result may occur with  improper specimen collection/handling, submission of specimen other than nasopharyngeal swab, presence of viral mutation(s) within the areas targeted by this assay, and inadequate number of viral copies(<138 copies/mL). A negative result must be combined with clinical observations, patient history, and epidemiological information. The expected result is Negative.  Fact Sheet for Patients:  EntrepreneurPulse.com.au  Fact Sheet for Healthcare Providers:  IncredibleEmployment.be  This test is no t yet approved or cleared by the [place] FDA and  has been authorized for detection and/or diagnosis of SARS-CoV-2 by FDA under an Emergency Use Authorization (EUA). This EUA will remain  in effect (meaning this test can be used) for the duration of the COVID-19 declaration under Section 564(b)(1) of the Act, 21 U.S.C.section 360bbb-3(b)(1), unless the authorization is terminated  or revoked sooner.       Influenza A by PCR NEGATIVE NEGATIVE Final   Influenza B by PCR NEGATIVE NEGATIVE Final    Comment: (NOTE) The Xpert Xpress SARS-CoV-2/FLU/RSV plus assay is intended as an aid in the diagnosis of influenza from Nasopharyngeal swab specimens and should not be used as a sole basis for treatment. Nasal washings and aspirates are unacceptable for Xpert Xpress SARS-CoV-2/FLU/RSV testing.  Fact Sheet for Patients: EntrepreneurPulse.com.au  Fact Sheet for Healthcare Providers: IncredibleEmployment.be  This test is not yet  approved or cleared by the [place] FDA and has been authorized for detection and/or diagnosis of SARS-CoV-2 by FDA under an Emergency Use Authorization (EUA). This EUA will remain in effect (meaning this test can be used) for the duration of the COVID-19 declaration under Section 564(b)(1) of the Act, 21 U.S.C. section 360bbb-3(b)(1), unless the authorization is terminated or revoked.  Performed at [facility], [address], [place], [place] [address]   Culture, blood (routine x 2)     Status: None (Preliminary result)  Collection Time: 08/29/20  2:15 AM   Specimen: BLOOD  Result Value Ref Range Status   Specimen Description BLOOD RIGHT ANTECUBITAL  Final   Special Requests   Final    BOTTLES DRAWN AEROBIC AND ANAEROBIC Blood Culture adequate volume   Culture   Final    NO GROWTH 3 DAYS Performed at [facility], [address], [place], [place] [address]    Report Status PENDING  Incomplete  Culture, blood (routine x 2)     Status: None (Preliminary result)   Collection Time: 08/29/20  2:22 AM   Specimen: BLOOD LEFT HAND  Result Value Ref Range Status   Specimen Description BLOOD LEFT HAND  Final   Special Requests   Final    BOTTLES DRAWN AEROBIC AND ANAEROBIC Blood Culture adequate volume   Culture   Final    NO GROWTH 3 DAYS Performed at [facility], [place] [address] [place], [place] [address]    Report Status PENDING  Incomplete   Radiology Studies: DG Abd Portable 1V  Result Date: 09/01/2020 CLINICAL DATA:  Small-bowel obstruction. EXAM: PORTABLE ABDOMEN - 1 VIEW COMPARISON:  08/31/2020.  08/30/2020.  CT 08/28/2020. FINDINGS: NG tube noted with tip in stable position over the upper stomach. Progressive small-bowel distention. Small-bowel dilatation up to 5.7 cm noted. Colon is nondistended. No free air noted. No acute bony abnormality. IMPRESSION: 1. NG tube noted with tip in stable position over the upper stomach. 2. Progressive  small-bowel distention. Findings again consistent with small bowel obstruction. Electronically Signed   By: Marcello Moores  Register   On: 09/01/2020 06:21   DG Abd Portable 1V  Result Date: 08/31/2020 CLINICAL DATA:  Nasogastric tube placement EXAM: PORTABLE ABDOMEN - 1 VIEW COMPARISON:  None. FINDINGS: Nasogastric tube tip within the mid body of the stomach. Visualized abdominal gas pattern is unremarkable. No gross free intraperitoneal gas. Right flank and pelvis excluded from view. IMPRESSION: Nasogastric tube tip within the mid body of the stomach. Electronically Signed   By: Fidela Salisbury MD   On: 08/31/2020 17:55   DG Abd Portable 1V  Result Date: 08/31/2020 CLINICAL DATA:  Small bowel obstruction. EXAM: PORTABLE ABDOMEN - 1 VIEW COMPARISON:  08/30/2020 FINDINGS: An enteric tube remains looped in the region of the gastric fundus. There are a few mildly dilated gas-filled loops of small bowel in the left mid abdomen which are more conspicuous than on the prior study. Gas is present in nondilated colon. Heterogeneous bibasilar pulmonary airspace opacities are suboptimally evaluated due to overpenetration. IMPRESSION: Mildly dilated small bowel loops in the left mid abdomen which could reflect partial obstruction. Electronically Signed   By: Logan Bores M.D.   On: 08/31/2020 08:45   DG Abd Portable 1V  Result Date: 08/30/2020 CLINICAL DATA:  Nasogastric tube replacement EXAM: PORTABLE ABDOMEN - 1 VIEW COMPARISON:  Earlier film of the same day FINDINGS: Nasogastric tube loops in the decompressed gastric fundus. A few gas filled mid abdominal small bowel loops. The right lateral and inferior abdomen are excluded. IMPRESSION: Nasogastric tube loops in the gastric fundus. Electronically Signed   By: Lucrezia Europe M.D.   On: 08/30/2020 13:42   DG Abd Portable 1V  Result Date: 08/30/2020 CLINICAL DATA:  Small-bowel obstruction EXAM: PORTABLE ABDOMEN - 1 VIEW COMPARISON:  08/29/2020, 08/28/2020 FINDINGS: Enteric  tube terminates within the gastric body. Abnormally dilated loop of small bowel within the left hemiabdomen measuring up to 4.5 cm in diameter, which appears to be  thickened. Faint dilute contrast within small bowel, but not definitively seen within the colon. Excreted contrast within the urinary bladder. No gross pneumoperitoneum. IMPRESSION: Abnormally dilated loop of small bowel within the left hemiabdomen, which appears to be thickened. Faint dilute contrast within small bowel, but not definitively seen within the colon. Findings concerning for worsening small bowel obstruction, possibly closed loop given appearance on previous CT. Electronically Signed   By: Davina Poke D.O.   On: 08/30/2020 10:23     Scheduled Meds: Continuous Infusions:  sodium chloride 250 mL (08/31/20 0522)     LOS: 3 days   Time spent: 60min  Rafferty Postlewait C Jermaine Neuharth, DO [facility]  If 7PM-7AM, please contact night-coverage [email or web address]  09/01/2020, 7:38 AM

## 2020-09-01 NOTE — Progress Notes (Signed)
Progress Note     Subjective: Patient reports pain has almost completely resolved this AM. He also reports passing some flatus this AM. He would like to walk more, he did not get to ambulate much yesterday. He also reports this AM that he was wondering if all this could have been caused by drinking spoiled buttermilk. He reports he usually takes pills with buttermilk and he got some recently that seemed "off" but drank it a few days in a row.   Objective: Vital signs in last 24 hours: Temp:  [98.5 F (36.9 C)-98.8 F (37.1 C)] 98.8 F (37.1 C) (07/19 0457) Pulse Rate:  [80-107] 80 (07/19 0457) Resp:  [18] 18 (07/19 0457) BP: (149-168)/(85-97) 152/85 (07/19 0457) SpO2:  [91 %-95 %] 91 % (07/19 0457) Weight:  [96.5 kg] 96.5 kg (07/19 0457) Last BM Date: 08/28/20  Intake/Output from previous day: 07/18 0701 - 07/19 0700 In: 373.5 [I.V.:373.5] Out: 4000 [Urine:1500; Emesis/NG output:2500] Intake/Output this shift: No intake/output data recorded.  PE: General: pleasant, WD, WN male who is laying in bed in NAD Heart: regular, rate, and rhythm.   Lungs: CTAB, no wheezes, rhonchi, or rales noted.  Respiratory effort nonlabored Abd: soft, mild ttp with deep palpation over surgical scar, no peritonitis, ND, +BS, NGT with bilious drainage  MS: all 4 extremities are symmetrical with no cyanosis, clubbing, or edema. Skin: warm and dry with no masses, lesions, or rashes Psych: A&Ox3 with an appropriate affect.     Lab Results:  Recent Labs    08/31/20 0521 09/01/20 0358  WBC 11.0* 10.7*  HGB 15.9 16.8  HCT 50.2 51.7  PLT 171 202   BMET Recent Labs    08/31/20 0521 09/01/20 0358  NA 138 140  K 3.9 3.3*  CL 103 101  CO2 23 29  GLUCOSE 89 88  BUN 19 23  CREATININE 0.92 0.99  CALCIUM 9.3 9.6   PT/INR No results for input(s): LABPROT, INR in the last 72 hours. CMP     Component Value Date/Time   NA 140 09/01/2020 0358   NA 141 12/03/2019 1509   K 3.3 (L)  09/01/2020 0358   CL 101 09/01/2020 0358   CO2 29 09/01/2020 0358   GLUCOSE 88 09/01/2020 0358   GLUCOSE 94 01/10/2006 1108   BUN 23 09/01/2020 0358   BUN 10 12/03/2019 1509   CREATININE 0.99 09/01/2020 0358   CREATININE 0.84 09/30/2019 1349   CALCIUM 9.6 09/01/2020 0358   PROT 6.8 08/28/2020 1931   PROT 6.5 12/03/2019 1509   ALBUMIN 4.2 08/28/2020 1931   ALBUMIN 4.2 12/03/2019 1509   AST 24 08/28/2020 1931   ALT 18 08/28/2020 1931   ALKPHOS 52 08/28/2020 1931   BILITOT 1.2 08/28/2020 1931   BILITOT 0.9 12/03/2019 1509   GFRNONAA >60 09/01/2020 0358   GFRAA 97 12/03/2019 1509   Lipase     Component Value Date/Time   LIPASE 15 08/28/2020 1931       Studies/Results: DG Abd Portable 1V  Result Date: 09/01/2020 CLINICAL DATA:  Small-bowel obstruction. EXAM: PORTABLE ABDOMEN - 1 VIEW COMPARISON:  08/31/2020.  08/30/2020.  CT 08/28/2020. FINDINGS: NG tube noted with tip in stable position over the upper stomach. Progressive small-bowel distention. Small-bowel dilatation up to 5.7 cm noted. Colon is nondistended. No free air noted. No acute bony abnormality. IMPRESSION: 1. NG tube noted with tip in stable position over the upper stomach. 2. Progressive small-bowel distention. Findings again consistent with small bowel obstruction. Electronically  Signed   By: Marcello Moores  Register   On: 09/01/2020 06:21   DG Abd Portable 1V  Result Date: 08/31/2020 CLINICAL DATA:  Nasogastric tube placement EXAM: PORTABLE ABDOMEN - 1 VIEW COMPARISON:  None. FINDINGS: Nasogastric tube tip within the mid body of the stomach. Visualized abdominal gas pattern is unremarkable. No gross free intraperitoneal gas. Right flank and pelvis excluded from view. IMPRESSION: Nasogastric tube tip within the mid body of the stomach. Electronically Signed   By: Fidela Salisbury MD   On: 08/31/2020 17:55   DG Abd Portable 1V  Result Date: 08/31/2020 CLINICAL DATA:  Small bowel obstruction. EXAM: PORTABLE ABDOMEN - 1 VIEW  COMPARISON:  08/30/2020 FINDINGS: An enteric tube remains looped in the region of the gastric fundus. There are a few mildly dilated gas-filled loops of small bowel in the left mid abdomen which are more conspicuous than on the prior study. Gas is present in nondilated colon. Heterogeneous bibasilar pulmonary airspace opacities are suboptimally evaluated due to overpenetration. IMPRESSION: Mildly dilated small bowel loops in the left mid abdomen which could reflect partial obstruction. Electronically Signed   By: Logan Bores M.D.   On: 08/31/2020 08:45   DG Abd Portable 1V  Result Date: 08/30/2020 CLINICAL DATA:  Nasogastric tube replacement EXAM: PORTABLE ABDOMEN - 1 VIEW COMPARISON:  Earlier film of the same day FINDINGS: Nasogastric tube loops in the decompressed gastric fundus. A few gas filled mid abdominal small bowel loops. The right lateral and inferior abdomen are excluded. IMPRESSION: Nasogastric tube loops in the gastric fundus. Electronically Signed   By: Lucrezia Europe M.D.   On: 08/30/2020 13:42   DG Abd Portable 1V  Result Date: 08/30/2020 CLINICAL DATA:  Small-bowel obstruction EXAM: PORTABLE ABDOMEN - 1 VIEW COMPARISON:  08/29/2020, 08/28/2020 FINDINGS: Enteric tube terminates within the gastric body. Abnormally dilated loop of small bowel within the left hemiabdomen measuring up to 4.5 cm in diameter, which appears to be thickened. Faint dilute contrast within small bowel, but not definitively seen within the colon. Excreted contrast within the urinary bladder. No gross pneumoperitoneum. IMPRESSION: Abnormally dilated loop of small bowel within the left hemiabdomen, which appears to be thickened. Faint dilute contrast within small bowel, but not definitively seen within the colon. Findings concerning for worsening small bowel obstruction, possibly closed loop given appearance on previous CT. Electronically Signed   By: Davina Poke D.O.   On: 08/30/2020 10:23     Anti-infectives: Anti-infectives (From admission, onward)    Start     Dose/Rate Route Frequency Ordered Stop   08/29/20 0600  cefTRIAXone (ROCEPHIN) 1 g in sodium chloride 0.9 % 100 mL IVPB        1 g 200 mL/hr over 30 Minutes Intravenous Every 24 hours 08/29/20 0204 08/31/20 0730   08/28/20 2300  piperacillin-tazobactam (ZOSYN) IVPB 3.375 g        3.375 g 100 mL/hr over 30 Minutes Intravenous  Once 08/28/20 2245 08/28/20 2342        Assessment/Plan Left upper quadrant abdominal pain Partial small bowel obstruction-inflamed small bowel loop - He has a remote history of perforated sigmoid diverticulitis and an incarcerated left inguinal hernia for which he underwent urgent sigmoid colectomy with colostomy and and repair of a incarcerated left indirect inguinal hernia by Dr. Dalbert Batman in 2010.  He subsequently underwent reversal in 2011 with takedown and closure of colostomy, lysis of adhesions and repair of parastomal hernia with Stratus mesh. - CT scan noted possible closed loop. Was not  felt to have surgical abdomen on admission. - SBO protocol - gastrografin was given 7/16 AM. Film this AM with progressive SBO although clinically patient now passing some flatus and pain improved. Hold heparin gtt until Dr. Georgette Dover can evaluate - may still need OR today vs repeat SBO protocol - pt also reported drinking some buttermilk that was bad a few days ago - ?enteritis as the source of all this  - Keep K > 4 and Mg >2 for bowel function - Mobilize for bowel function    FEN - NPO, NGT to LIWS, IVF per [facility] VTE - SCDs, heparin gtt held  ID - Rocephin for UTI per [facility]   A. Fib on Eliquis - on hold  History of CVA January 2022 UTI Hypertension Hyperlipidemia  LOS: 3 days    Norm Parcel, [facility] Surgery 09/01/2020, 8:05 AM Please see Amion for pager number during day hours 7:00am-4:30pm

## 2020-09-01 NOTE — Progress Notes (Signed)
[place] for apixaban>> heparin Indication: atrial fibrillation and bridging while NPO for SBO  Allergies  Allergen Reactions   Other Other (See Comments)    Some form of numbing medication used by dentist, almost died   Simvastatin Other (See Comments)    Muscle aches     Patient Measurements: Height: 6' (182.9 cm) Weight: 96.5 kg (212 lb 11.9 oz) IBW/kg (Calculated) : 77.6 Heparin Dosing Weight: 97.4 kg   Vital Signs: Temp: 98.8 F (37.1 C) (07/19 0457) Temp Source: Oral (07/19 0457) BP: 152/85 (07/19 0457) Pulse Rate: 80 (07/19 0457)  Labs: Recent Labs    08/30/20 0031 08/30/20 0715 08/31/20 0521 08/31/20 0655 09/01/20 0358  HGB 16.5  --  15.9  --  16.8  HCT 50.2  --  50.2  --  51.7  PLT 205  --  171  --  202  APTT 69* 68*  --   --   --   HEPARINUNFRC 0.64  --   --  0.35 0.42  CREATININE 0.98  --  0.92  --  0.99     Estimated Creatinine Clearance: 71.7 mL/min (by C-G formula based on SCr of 0.99 mg/dL).   Assessment: 39 yof with known hx of Afib presented with ab pain - now found to have SBO and is NPO, requiring parenteral anticoagulation for atrial fibrillation (CHADSVASC 6). Last dose apixaban on 7/15@1200 .   Heparin level therapeutic at 0.42 on 1350 units/hr. CBC stable. No signs of bleeding.   Heparin d/c'd per Surgery service this AM. No note found. Possible surgery today? Once heparin resumed will restart at 1350 units/hr.   Goal of Therapy:  Heparin level 0.3-0.7 units/ml Monitor platelets by anticoagulation protocol: Yes   Plan:  Hold heparin at this time per surgery service Resume heparin infusion at 1350 units/hr when OK with surgery Daily heparin level, and CBC  Charae Depaolis A. Levada Dy, PharmD, BCPS, FNKF Clinical Pharmacist [facility] Please utilize Amion for appropriate phone number to reach the unit pharmacist ([facility])

## 2020-09-02 ENCOUNTER — Inpatient Hospital Stay (HOSPITAL_COMMUNITY): Payer: Medicare HMO | Admitting: Certified Registered Nurse Anesthetist

## 2020-09-02 ENCOUNTER — Inpatient Hospital Stay (HOSPITAL_COMMUNITY): Payer: Medicare HMO

## 2020-09-02 ENCOUNTER — Encounter (HOSPITAL_COMMUNITY)
Admission: EM | Disposition: A | Discharge status: Skilled Nursing Facility | Payer: Self-pay | Source: Home / Self Care | Attending: Internal Medicine

## 2020-09-02 ENCOUNTER — Encounter (HOSPITAL_COMMUNITY): Payer: Self-pay | Admitting: Family Medicine

## 2020-09-02 ENCOUNTER — Inpatient Hospital Stay: Payer: Self-pay

## 2020-09-02 DIAGNOSIS — K55019 Acute (reversible) ischemia of small intestine, extent unspecified: Secondary | ICD-10-CM | POA: Diagnosis not present

## 2020-09-02 DIAGNOSIS — K5651 Intestinal adhesions [bands], with partial obstruction: Secondary | ICD-10-CM | POA: Diagnosis not present

## 2020-09-02 DIAGNOSIS — K56609 Unspecified intestinal obstruction, unspecified as to partial versus complete obstruction: Secondary | ICD-10-CM | POA: Diagnosis not present

## 2020-09-02 HISTORY — PX: APPLICATION OF WOUND VAC: SHX5189

## 2020-09-02 HISTORY — PX: LAPAROTOMY: SHX154

## 2020-09-02 LAB — BASIC METABOLIC PANEL
Anion gap: 21 — ABNORMAL HIGH (ref 5–15)
BUN: 34 mg/dL — ABNORMAL HIGH (ref 8–23)
CO2: 17 mmol/L — ABNORMAL LOW (ref 22–32)
Calcium: 9.4 mg/dL (ref 8.9–10.3)
Chloride: 105 mmol/L (ref 98–111)
Creatinine, Ser: 1.09 mg/dL (ref 0.61–1.24)
GFR, Estimated: >60 mL/min (ref >60)
Glucose, Bld: 90 mg/dL (ref 70–99)
Potassium: 3.8 mmol/L (ref 3.5–5.1)
Sodium: 143 mmol/L (ref 135–145)

## 2020-09-02 LAB — CBC
HCT: 48.2 % (ref 39.0–52.0)
Hemoglobin: 16 g/dL (ref 13.0–17.0)
MCH: 29.8 pg (ref 26.0–34.0)
MCHC: 33.2 g/dL (ref 30.0–36.0)
MCV: 89.8 fL (ref 80.0–100.0)
Platelets: 191 10*3/uL (ref 150–400)
RBC: 5.37 MIL/uL (ref 4.22–5.81)
RDW: 14 % (ref 11.5–15.5)
WBC: 7.9 10*3/uL (ref 4.0–10.5)
nRBC: 0 % (ref 0.0–0.2)

## 2020-09-02 LAB — HEPARIN LEVEL (UNFRACTIONATED): Heparin Unfractionated: 0.49 IU/mL (ref 0.30–0.70)

## 2020-09-02 LAB — MAGNESIUM: Magnesium: 1.9 mg/dL (ref 1.7–2.4)

## 2020-09-02 LAB — TYPE AND SCREEN
ABO/RH(D): A POS
Antibody Screen: NEGATIVE

## 2020-09-02 LAB — GLUCOSE, CAPILLARY
Glucose-Capillary: 115 mg/dL — ABNORMAL HIGH (ref 70–99)
Glucose-Capillary: 147 mg/dL — ABNORMAL HIGH (ref 70–99)
Glucose-Capillary: 139 mg/dL — ABNORMAL HIGH (ref 70–99)

## 2020-09-02 LAB — PHOSPHORUS: Phosphorus: 4.2 mg/dL (ref 2.5–4.6)

## 2020-09-02 IMAGING — DX DG CHEST 1V PORT
1 series · 1 of 1 positions shown · non-contrast
Comparison: Earlier today.

CLINICAL DATA: PICC placement.

EXAM:
PORTABLE CHEST 1 VIEW

[chest ap]
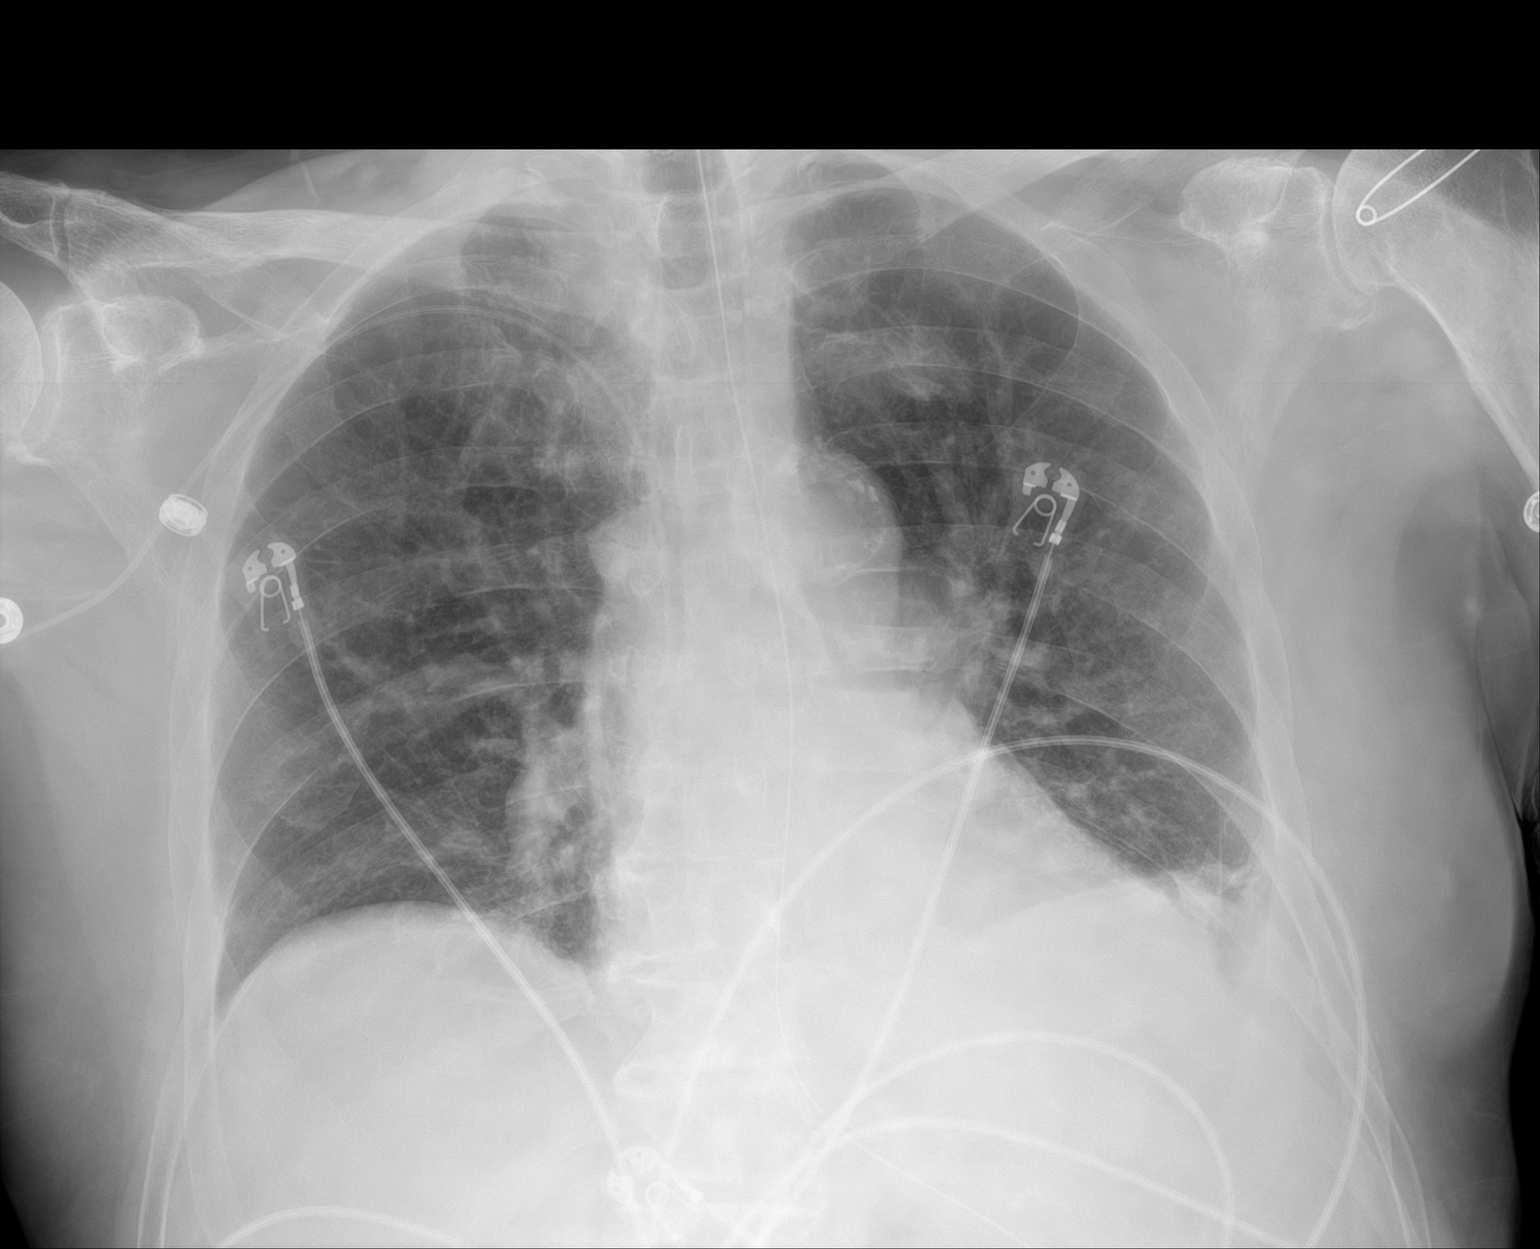

[1 of 1 positions shown; findings below may reference images not displayed]

FINDINGS: Right upper extremity PICC is been retracted, tip now in the mid
SVC. No pneumothorax. Persistent low lung volumes with bibasilar
atelectasis and vascular crowding. Enteric tube remains in place.
Stable heart size and mediastinal contours.
IMPRESSION: 1. Tip of the right upper extremity PICC in the mid SVC. No
pneumothorax.
2. Persistent low lung volumes with bibasilar atelectasis and
vascular crowding.

## 2020-09-02 IMAGING — DX DG ABD PORTABLE 1V
1 series · 1 of 1 positions shown · non-contrast
Comparison: [DATE].

CLINICAL DATA: Small-bowel obstruction.

EXAM:
PORTABLE ABDOMEN - 1 VIEW

[abdomen]
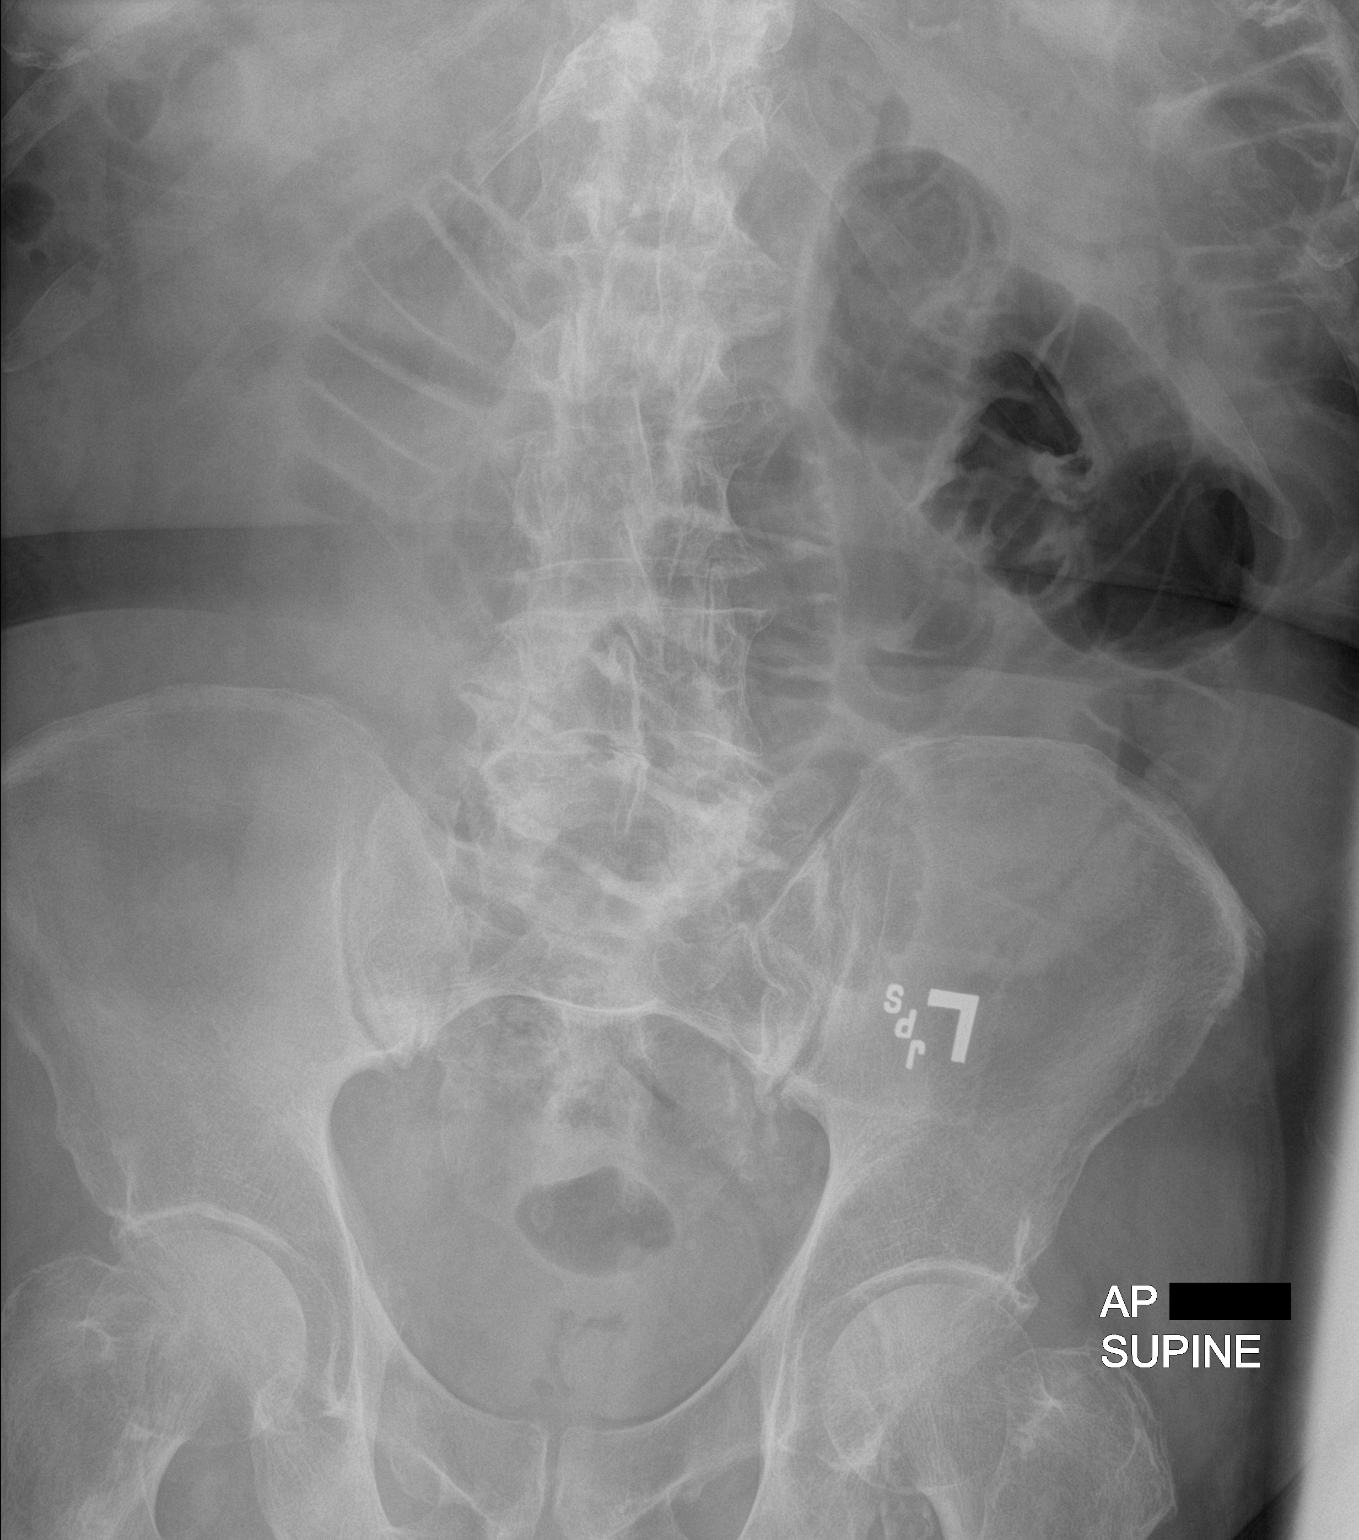

[1 of 1 positions shown; findings below may reference images not displayed]

FINDINGS: Upper abdomen not imaged. Continued progression of prominent small
bowel distention with a paucity of colonic gas. Findings again
consistent with persistent small-bowel obstruction. No free air
identified. Degenerative changes lumbar spine and both hips.
IMPRESSION: Continued progression of prominent small bowel distention. Findings
consistent with persistent small-bowel obstruction.

## 2020-09-02 IMAGING — DX DG CHEST 1V PORT
1 series · 1 of 1 positions shown · non-contrast
Comparison: [DATE]

CLINICAL DATA: PICC line placement

EXAM:
PORTABLE CHEST 1 VIEW

[chest]
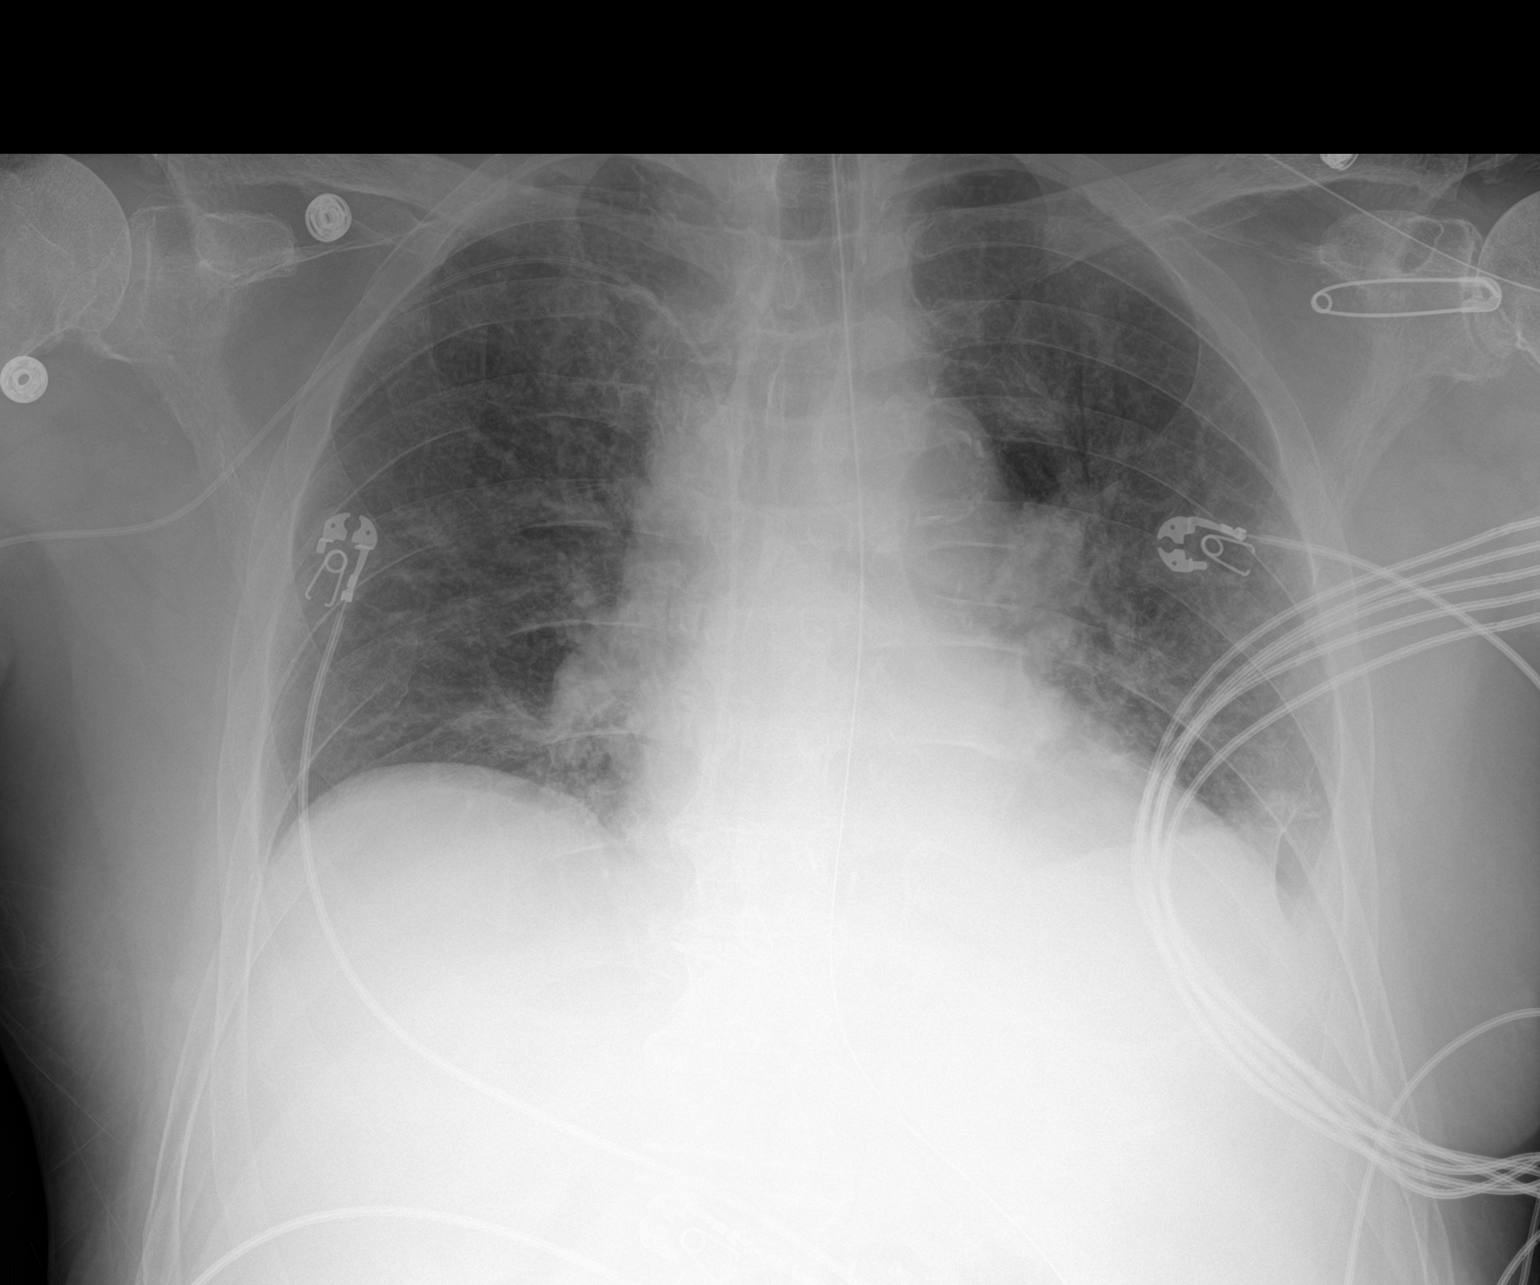

[1 of 1 positions shown; findings below may reference images not displayed]

FINDINGS: Lung volumes are small and there is resultant vascular crowding at
the hila. Mild bibasilar atelectasis. No pneumothorax or pleural
effusion. Right upper extremity PICC line tip is seen within the mid
right atrium, roughly 4 cm distal to the superior cavoatrial
junction. Nasogastric tube extends into the left upper quadrant of
the abdomen. Cardiac size is within normal limits when accounting
for poor pulmonary insufflation. No acute bone abnormality.
IMPRESSION: Right upper extremity PICC line tip within the right atrium 4 cm
distal to the superior cavoatrial junction.

Pulmonary hypoinflation with bibasilar atelectasis.

## 2020-09-02 SURGERY — LAPAROTOMY, EXPLORATORY
Anesthesia: General | Site: Abdomen

## 2020-09-02 MED ORDER — CHLORHEXIDINE GLUCONATE CLOTH 2 % EX PADS
6.0000 | MEDICATED_PAD | Freq: Every day | CUTANEOUS | Status: DC
  Administered 2020-09-02 – 2020-09-18 (×17): 6 via TOPICAL

## 2020-09-02 MED ORDER — ROCURONIUM BROMIDE 10 MG/ML (PF) SYRINGE
PREFILLED_SYRINGE | INTRAVENOUS | Status: DC | PRN
  Administered 2020-09-02: 50 mg via INTRAVENOUS
  Administered 2020-09-02: 20 mg via INTRAVENOUS
  Administered 2020-09-02: 30 mg via INTRAVENOUS

## 2020-09-02 MED ORDER — METOPROLOL TARTRATE 5 MG/5ML IV SOLN
INTRAVENOUS | Status: DC | PRN
  Administered 2020-09-02 (×2): 2.5 mg via INTRAVENOUS

## 2020-09-02 MED ORDER — PROPOFOL 10 MG/ML IV BOLUS
INTRAVENOUS | Status: DC | PRN
  Administered 2020-09-02: 180 mg via INTRAVENOUS

## 2020-09-02 MED ORDER — DEXAMETHASONE SODIUM PHOSPHATE 10 MG/ML IJ SOLN
INTRAMUSCULAR | Status: AC
  Filled 2020-09-02: qty 1

## 2020-09-02 MED ORDER — ACETAMINOPHEN 10 MG/ML IV SOLN
INTRAVENOUS | Status: DC | PRN
  Administered 2020-09-02: 1000 mg via INTRAVENOUS

## 2020-09-02 MED ORDER — PROPOFOL 10 MG/ML IV BOLUS
INTRAVENOUS | Status: AC
  Filled 2020-09-02: qty 20

## 2020-09-02 MED ORDER — OXYCODONE HCL 5 MG PO TABS: 5.0000 mg | ORAL_TABLET | Freq: Once | ORAL | Status: DC | PRN

## 2020-09-02 MED ORDER — ONDANSETRON HCL 4 MG/2ML IJ SOLN
INTRAMUSCULAR | Status: DC | PRN
  Administered 2020-09-02: 4 mg via INTRAVENOUS

## 2020-09-02 MED ORDER — ACETAMINOPHEN 10 MG/ML IV SOLN
INTRAVENOUS | Status: AC
  Filled 2020-09-02: qty 100

## 2020-09-02 MED ORDER — FENTANYL CITRATE (PF) 100 MCG/2ML IJ SOLN
INTRAMUSCULAR | Status: AC
  Administered 2020-09-02: 50 ug
  Filled 2020-09-02: qty 2

## 2020-09-02 MED ORDER — FENTANYL CITRATE (PF) 100 MCG/2ML IJ SOLN: 25.0000 ug | INTRAMUSCULAR | Status: DC | PRN

## 2020-09-02 MED ORDER — DEXAMETHASONE SODIUM PHOSPHATE 10 MG/ML IJ SOLN
INTRAMUSCULAR | Status: DC | PRN
  Administered 2020-09-02: 5 mg via INTRAVENOUS

## 2020-09-02 MED ORDER — TRAVASOL 10 % IV SOLN
INTRAVENOUS | Status: AC
  Filled 2020-09-02: qty 518.4

## 2020-09-02 MED ORDER — 0.9 % SODIUM CHLORIDE (POUR BTL) OPTIME
TOPICAL | Status: DC | PRN
  Administered 2020-09-02: 6000 mL

## 2020-09-02 MED ORDER — ESMOLOL HCL 100 MG/10ML IV SOLN
INTRAVENOUS | Status: DC | PRN
  Administered 2020-09-02 (×3): 20 mg via INTRAVENOUS

## 2020-09-02 MED ORDER — HYDROMORPHONE HCL 1 MG/ML IJ SOLN: 1.0000 mg | INTRAMUSCULAR | Status: DC | PRN

## 2020-09-02 MED ORDER — FENTANYL CITRATE (PF) 250 MCG/5ML IJ SOLN
INTRAMUSCULAR | Status: DC | PRN
  Administered 2020-09-02 (×5): 50 ug via INTRAVENOUS

## 2020-09-02 MED ORDER — PHENYLEPHRINE 40 MCG/ML (10ML) SYRINGE FOR IV PUSH (FOR BLOOD PRESSURE SUPPORT)
PREFILLED_SYRINGE | INTRAVENOUS | Status: DC | PRN
  Administered 2020-09-02 (×2): 80 ug via INTRAVENOUS

## 2020-09-02 MED ORDER — ROCURONIUM BROMIDE 10 MG/ML (PF) SYRINGE
PREFILLED_SYRINGE | INTRAVENOUS | Status: AC
  Filled 2020-09-02: qty 10

## 2020-09-02 MED ORDER — CEFAZOLIN SODIUM-DEXTROSE 2-4 GM/100ML-% IV SOLN
2.0000 g | Freq: Once | INTRAVENOUS | Status: AC
  Administered 2020-09-02: 2 g via INTRAVENOUS
  Filled 2020-09-02 (×2): qty 100

## 2020-09-02 MED ORDER — PHENYLEPHRINE HCL-NACL 10-0.9 MG/250ML-% IV SOLN
INTRAVENOUS | Status: DC | PRN
  Administered 2020-09-02: 40 ug/min via INTRAVENOUS

## 2020-09-02 MED ORDER — POTASSIUM CHLORIDE IN NACL 20-0.9 MEQ/L-% IV SOLN
INTRAVENOUS | Status: DC
  Filled 2020-09-02 (×2): qty 1000

## 2020-09-02 MED ORDER — HYDROMORPHONE HCL 1 MG/ML IJ SOLN
0.5000 mg | INTRAMUSCULAR | Status: DC | PRN
  Administered 2020-09-03 (×2): 0.5 mg via INTRAVENOUS
  Filled 2020-09-02 (×2): qty 0.5

## 2020-09-02 MED ORDER — DEXMEDETOMIDINE (PRECEDEX) IN NS 20 MCG/5ML (4 MCG/ML) IV SYRINGE
PREFILLED_SYRINGE | INTRAVENOUS | Status: DC | PRN
  Administered 2020-09-02: 8 ug via INTRAVENOUS
  Administered 2020-09-02: 4 ug via INTRAVENOUS
  Administered 2020-09-02: 8 ug via INTRAVENOUS

## 2020-09-02 MED ORDER — OXYCODONE HCL 5 MG/5ML PO SOLN: 5.0000 mg | Freq: Once | ORAL | Status: DC | PRN

## 2020-09-02 MED ORDER — SUGAMMADEX SODIUM 200 MG/2ML IV SOLN
INTRAVENOUS | Status: DC | PRN
  Administered 2020-09-02: 200 mg via INTRAVENOUS

## 2020-09-02 MED ORDER — ORAL CARE MOUTH RINSE: 15.0000 mL | Freq: Once | OROMUCOSAL | Status: AC

## 2020-09-02 MED ORDER — METHOCARBAMOL 1000 MG/10ML IJ SOLN
500.0000 mg | Freq: Three times a day (TID) | INTRAVENOUS | Status: DC
  Administered 2020-09-02 – 2020-09-03 (×2): 500 mg via INTRAVENOUS
  Filled 2020-09-02 (×2): qty 500

## 2020-09-02 MED ORDER — FENTANYL CITRATE (PF) 100 MCG/2ML IJ SOLN
50.0000 ug | Freq: Once | INTRAMUSCULAR | Status: AC
Start: 2020-09-02 — End: 2020-09-02
  Administered 2020-09-02: 50 ug via INTRAVENOUS
  Filled 2020-09-02: qty 2

## 2020-09-02 MED ORDER — DEXMEDETOMIDINE (PRECEDEX) IN NS 20 MCG/5ML (4 MCG/ML) IV SYRINGE
PREFILLED_SYRINGE | INTRAVENOUS | Status: AC
  Filled 2020-09-02: qty 5

## 2020-09-02 MED ORDER — INSULIN ASPART 100 UNIT/ML IJ SOLN
0.0000 [IU] | Freq: Four times a day (QID) | INTRAMUSCULAR | Status: DC
  Administered 2020-09-02: 1 [IU] via SUBCUTANEOUS
  Administered 2020-09-03: 2 [IU] via SUBCUTANEOUS

## 2020-09-02 MED ORDER — SODIUM CHLORIDE 0.9% FLUSH
10.0000 mL | INTRAVENOUS | Status: DC | PRN
  Administered 2020-09-03 – 2020-09-07 (×3): 10 mL
  Administered 2020-09-10: 20 mL

## 2020-09-02 MED ORDER — METOPROLOL TARTRATE 5 MG/5ML IV SOLN
INTRAVENOUS | Status: AC
  Filled 2020-09-02: qty 5

## 2020-09-02 MED ORDER — PIPERACILLIN-TAZOBACTAM 3.375 G IVPB
3.3750 g | Freq: Three times a day (TID) | INTRAVENOUS | Status: AC
  Administered 2020-09-02 – 2020-09-07 (×14): 3.375 g via INTRAVENOUS
  Filled 2020-09-02 (×15): qty 50

## 2020-09-02 MED ORDER — SUCCINYLCHOLINE CHLORIDE 200 MG/10ML IV SOSY
PREFILLED_SYRINGE | INTRAVENOUS | Status: DC | PRN
  Administered 2020-09-02: 120 mg via INTRAVENOUS

## 2020-09-02 MED ORDER — ACETAMINOPHEN 10 MG/ML IV SOLN
1000.0000 mg | Freq: Four times a day (QID) | INTRAVENOUS | Status: AC
  Administered 2020-09-02 – 2020-09-03 (×4): 1000 mg via INTRAVENOUS
  Filled 2020-09-02 (×4): qty 100

## 2020-09-02 MED ORDER — SUCCINYLCHOLINE CHLORIDE 200 MG/10ML IV SOSY
PREFILLED_SYRINGE | INTRAVENOUS | Status: AC
  Filled 2020-09-02: qty 10

## 2020-09-02 MED ORDER — ESMOLOL HCL 100 MG/10ML IV SOLN
INTRAVENOUS | Status: AC
  Filled 2020-09-02: qty 10

## 2020-09-02 MED ORDER — LIDOCAINE 2% (20 MG/ML) 5 ML SYRINGE
INTRAMUSCULAR | Status: DC | PRN
  Administered 2020-09-02: 50 mg via INTRAVENOUS

## 2020-09-02 MED ORDER — SODIUM CHLORIDE 0.9% FLUSH
10.0000 mL | Freq: Two times a day (BID) | INTRAVENOUS | Status: DC
  Administered 2020-09-02 – 2020-09-18 (×19): 10 mL

## 2020-09-02 MED ORDER — LACTATED RINGERS IV SOLN: INTRAVENOUS | Status: DC

## 2020-09-02 MED ORDER — ONDANSETRON HCL 4 MG/2ML IJ SOLN: 4.0000 mg | Freq: Once | INTRAMUSCULAR | Status: DC | PRN

## 2020-09-02 MED ORDER — CHLORHEXIDINE GLUCONATE 0.12 % MT SOLN
15.0000 mL | Freq: Once | OROMUCOSAL | Status: AC
  Administered 2020-09-02: 15 mL via OROMUCOSAL
  Filled 2020-09-02: qty 15

## 2020-09-02 MED ORDER — PHENYLEPHRINE 40 MCG/ML (10ML) SYRINGE FOR IV PUSH (FOR BLOOD PRESSURE SUPPORT)
PREFILLED_SYRINGE | INTRAVENOUS | Status: AC
  Filled 2020-09-02: qty 10

## 2020-09-02 MED ORDER — LIDOCAINE 2% (20 MG/ML) 5 ML SYRINGE
INTRAMUSCULAR | Status: AC
  Filled 2020-09-02: qty 5

## 2020-09-02 MED ORDER — FENTANYL CITRATE (PF) 250 MCG/5ML IJ SOLN
INTRAMUSCULAR | Status: AC
  Filled 2020-09-02: qty 5

## 2020-09-02 SURGICAL SUPPLY — 45 items
BAG COUNTER SPONGE SURGICOUNT (BAG) ×3 IMPLANT
BLADE CLIPPER SURG (BLADE) ×3 IMPLANT
CANISTER SUCT 3000ML PPV (MISCELLANEOUS) ×3 IMPLANT
CANISTER WOUNDNEG PRESSURE 500 (CANNISTER) ×3 IMPLANT
CHLORAPREP W/TINT 26 (MISCELLANEOUS) ×3 IMPLANT
COVER SURGICAL LIGHT HANDLE (MISCELLANEOUS) ×3 IMPLANT
DRAPE LAPAROSCOPIC ABDOMINAL (DRAPES) ×3 IMPLANT
DRAPE WARM FLUID 44X44 (DRAPES) ×3 IMPLANT
DRSG OPSITE POSTOP 4X10 (GAUZE/BANDAGES/DRESSINGS) IMPLANT
DRSG OPSITE POSTOP 4X8 (GAUZE/BANDAGES/DRESSINGS) IMPLANT
DRSG VAC ATS MED SENSATRAC (GAUZE/BANDAGES/DRESSINGS) ×3 IMPLANT
ELECT BLADE 4.0 EZ CLEAN MEGAD (MISCELLANEOUS) ×3
ELECT CAUTERY BLADE 6.4 (BLADE) ×6 IMPLANT
ELECT REM PT RETURN 9FT ADLT (ELECTROSURGICAL) ×3
ELECTRODE BLDE 4.0 EZ CLN MEGD (MISCELLANEOUS) ×2 IMPLANT
ELECTRODE REM PT RTRN 9FT ADLT (ELECTROSURGICAL) ×2 IMPLANT
GLOVE SURG ENC MOIS LTX SZ7 (GLOVE) ×3 IMPLANT
GLOVE SURG PR MICRO ENCORE 7 (GLOVE) ×3 IMPLANT
GLOVE SURG UNDER POLY LF SZ7.5 (GLOVE) ×6 IMPLANT
GOWN STRL REUS W/ TWL LRG LVL3 (GOWN DISPOSABLE) ×4 IMPLANT
GOWN STRL REUS W/TWL LRG LVL3 (GOWN DISPOSABLE) ×6
HANDLE SUCTION POOLE (INSTRUMENTS) ×2 IMPLANT
KIT BASIN OR (CUSTOM PROCEDURE TRAY) ×3 IMPLANT
KIT TURNOVER KIT B (KITS) ×3 IMPLANT
NS IRRIG 1000ML POUR BTL (IV SOLUTION) ×6 IMPLANT
PACK GENERAL/GYN (CUSTOM PROCEDURE TRAY) ×3 IMPLANT
PAD ARMBOARD 7.5X6 YLW CONV (MISCELLANEOUS) ×3 IMPLANT
PENCIL SMOKE EVACUATOR (MISCELLANEOUS) ×3 IMPLANT
RELOAD PROXIMATE 75MM BLUE (ENDOMECHANICALS) ×9 IMPLANT
SEALER TISSUE X1 CVD JAW (INSTRUMENTS) ×3 IMPLANT
SPECIMEN JAR LARGE (MISCELLANEOUS) IMPLANT
SPONGE T-LAP 18X18 ~~LOC~~+RFID (SPONGE) ×24 IMPLANT
STAPLER GUN LINEAR PROX 60 (STAPLE) ×3 IMPLANT
STAPLER PROXIMATE 75MM BLUE (STAPLE) ×3 IMPLANT
STAPLER VISISTAT 35W (STAPLE) ×6 IMPLANT
SUCTION POOLE HANDLE (INSTRUMENTS) ×3
SUT PDS AB 1 TP1 96 (SUTURE) ×6 IMPLANT
SUT SILK 2 0 SH CR/8 (SUTURE) ×3 IMPLANT
SUT SILK 2 0 TIES 10X30 (SUTURE) ×3 IMPLANT
SUT SILK 2 0SH CR/8 30 (SUTURE) ×3 IMPLANT
SUT SILK 3 0 SH CR/8 (SUTURE) ×6 IMPLANT
SUT SILK 3 0 TIES 10X30 (SUTURE) ×3 IMPLANT
TOWEL GREEN STERILE (TOWEL DISPOSABLE) ×3 IMPLANT
TRAY FOLEY MTR SLVR 16FR STAT (SET/KITS/TRAYS/PACK) ×3 IMPLANT
YANKAUER SUCT BULB TIP NO VENT (SUCTIONS) IMPLANT

## 2020-09-02 NOTE — Op Note (Addendum)
Operative Report  Indications: The patient is an 80 year old male with a past medical history of perforated diverticulitis requiring Hartmann's procedure in 2010, since reversed and diverted, with reversal of his loop ileostomy, with an incision that has resulted in a hernia repaired with Strattice. He has had symptoms of a small bowel obstruction rendering him unable to take enteral nutrition for the past four days, with axial imaging concerning for possible closed loop pathology. Given his lack of progression, exploration with lysis of adhesions and possible bowel resection was recommended.  Pre-operative Diagnosis: Small bowel obstruction secondary to adhesive disease.  Post-operative Diagnosis: Small bowel obstruction - partial intermittent closed loop obstruction in left upper quadrant  Surgeon:  Imogene Burn. Georgette Dover, MD - primary Michaelle Birks, MD - secondary  Assistants: Lucas Mallow, MD MPH - resident  Anesthesia: General endotracheal anesthesia  ASA Class: 3, emergent  Procedures performed: Exploratory laparotomy Lysis of adhesions (> 45 minutes) Small bowel resection (~40cm) Placement of vacuum dressing  Intraoperative findings: Extensive adhesive disease tethering small bowel to omentum, colon, and abdominal sidewalls. Dusky/necrotic appearance of a loop of dilated small bowel in the left upper quadrant. Small, fat-containing hernia in right upper quadrant from prior incision Sutures from prior hernia repair.  Procedure in detail: The patient was seen in his room and a discussion regarding his diagnosis, prognosis, and aforementioned treatment options was had with him and the primary team. The risks, benefits, complications, treatment options, and expected outcomes were discussed with the patient. The possibilities of reaction to medication, pulmonary aspiration, perforation of viscus, bleeding, recurrent infection, the need for additional procedures, and development of a  complication requiring transfusion or further operation were discussed with the patient.  He was brought to the operating room, placed in the supine position and then administered general anesthesia. The abdomen was prepped with Chloraprep and draped in the standard fashion, A time out was conducted confirming the patient, procedures and indications. Appropriate antibiotics were administered prior to incision. A generous midline laparotomy incision was made from the epigastrium to the pubis. Dissection was carried down through the subcutaneous tissue with cautery to the midline fascia, which was incised and carefully dissected away from the adhesed preperitoneal fat. We then opened the peritoneum, encountering multiple dense adhesions throughout, and slowly began working to free the small bowel from its adhesions to the omentum and large bowel. This took over 90 minutes of meticulous sharp and blunt dissection.  The small bowel in the right side of the abdomen was relatively decompressed, but became more distended as we worked towards the left side.   Eventually, we were able to free the small bowel sufficiently to identify the primary tethering in the left upper quadrant. Of note, there was a loop that was notably distended and dusky in appearance with multiple serosal tears from distention.  With mild manipulation a serosal tear became a full-thickness tear, resulting in spillage of enteric contents. We controlled the perforations with 2-0 silk running suture and then resected this entire segment (approximately 40cm) and its associated mesentery using two loads of the GIA stapler (20mm blue load) and the Enseal device, respectively. We then performed a side-to-side, functional end-to-end anastomosis with a single fire of the 15mm blue load stapler and a single fire of the TX60 stapler to close the common enterotomy. Prior to closure of the common enterotomy the common channel was inspected and found to be  hemostatic and after closure, the anastomosis was palpated and was acceptable patent. The  mesenteric defect was closed with 2-0 silk sutures.  We then ran the bowel from the ligament of Treitz to the terminal ileum, noting no other areas of necrosis, erythema, or discoloration. A single serosal tear was oversewn with 3-0 silk suture. We then used copious warm saline to irrigate the abdomen of all debris and detritus, the raw edges were no longer oozing and hemostasis was adequate. We closed the fascia with #1 looped PDS sutures.  The skin was left open and a vacuum dressing with black sponge applied after irrigating the wound. The patient was then extubated and brought to the recovery room in stable condition.  All sponge, instrument, and needle counts were correct prior to closure and at the conclusion of the case.  Estimated Blood Loss:  150cc                Complications: spillage of enteric contents         Disposition: ICU - extubated and stable.         Condition: stable  Imogene Burn. Georgette Dover, MD, [facility]  General Surgery   09/02/2020 6:14 PM

## 2020-09-02 NOTE — Anesthesia Procedure Notes (Addendum)
Procedure Name: Intubation Date/Time: 09/02/2020 1:04 PM Performed by: Merryl Hacker, RN Pre-anesthesia Checklist: Patient identified, Emergency Drugs available, Suction available and Patient being monitored Patient Re-evaluated:Patient Re-evaluated prior to induction Oxygen Delivery Method: Circle System Utilized Preoxygenation: Pre-oxygenation with 100% oxygen Induction Type: IV induction, Rapid sequence and Cricoid Pressure applied Laryngoscope Size: Mac and 4 Grade View: Grade I Tube type: Oral Tube size: 7.5 mm Number of attempts: 1 Airway Equipment and Method: Stylet Placement Confirmation: ETT inserted through vocal cords under direct vision, positive ETCO2 and breath sounds checked- equal and bilateral Secured at: 23 cm Tube secured with: Tape Dental Injury: Teeth and Oropharynx as per pre-operative assessment  Comments: Pt with poor dentition, remains the same as pre-operative. Performed by Heide Scales, SRNA under direct supervision by Dr. Fransisco Beau and Reece Agar, CRNA

## 2020-09-02 NOTE — Progress Notes (Signed)
Progress Note     Subjective: Patient with little change since yesterday. Passed flatus once yesterday and once this AM but none outside of that. Pain is better when NGT is connected to suction.   Objective: Vital signs in last 24 hours: Temp:  [98 F (36.7 C)-98.2 F (36.8 C)] 98 F (36.7 C) (07/20 0913) Pulse Rate:  [68-106] 68 (07/20 0913) Resp:  [18-20] 18 (07/20 0913) BP: (112-155)/(68-102) 112/68 (07/20 0913) SpO2:  [90 %-93 %] 93 % (07/20 0913) Last BM Date: 08/28/20  Intake/Output from previous day: 07/19 0701 - 07/20 0700 In: 158.5 [I.V.:158.5] Out: 875 [Urine:275; Emesis/NG output:600] Intake/Output this shift: Total I/O In: 135 [I.V.:135] Out: -   PE: General: pleasant, WD, WN male who is laying in bed in NAD Heart: regular, rate, and rhythm.   Lungs: CTAB, no wheezes, rhonchi, or rales noted.  Respiratory effort nonlabored Abd: soft, mild ttp with deep palpation over surgical scar, no peritonitis, ND, +BS, NGT with bilious drainage  MS: all 4 extremities are symmetrical with no cyanosis, clubbing, or edema. Skin: warm and dry with no masses, lesions, or rashes Psych: A&Ox3 with an appropriate affect.   Lab Results:  Recent Labs    09/01/20 0358 09/02/20 0508  WBC 10.7* 7.9  HGB 16.8 16.0  HCT 51.7 48.2  PLT 202 191   BMET Recent Labs    09/01/20 0358 09/02/20 0508  NA 140 143  K 3.3* 3.8  CL 101 105  CO2 29 17*  GLUCOSE 88 90  BUN 23 34*  CREATININE 0.99 1.09  CALCIUM 9.6 9.4   PT/INR No results for input(s): LABPROT, INR in the last 72 hours. CMP     Component Value Date/Time   NA 143 09/02/2020 0508   NA 141 12/03/2019 1509   K 3.8 09/02/2020 0508   CL 105 09/02/2020 0508   CO2 17 (L) 09/02/2020 0508   GLUCOSE 90 09/02/2020 0508   GLUCOSE 94 01/10/2006 1108   BUN 34 (H) 09/02/2020 0508   BUN 10 12/03/2019 1509   CREATININE 1.09 09/02/2020 0508   CREATININE 0.84 09/30/2019 1349   CALCIUM 9.4 09/02/2020 0508   PROT 6.8  08/28/2020 1931   PROT 6.5 12/03/2019 1509   ALBUMIN 4.2 08/28/2020 1931   ALBUMIN 4.2 12/03/2019 1509   AST 24 08/28/2020 1931   ALT 18 08/28/2020 1931   ALKPHOS 52 08/28/2020 1931   BILITOT 1.2 08/28/2020 1931   BILITOT 0.9 12/03/2019 1509   GFRNONAA >60 09/02/2020 0508   GFRAA 97 12/03/2019 1509   Lipase     Component Value Date/Time   LIPASE 15 08/28/2020 1931       Studies/Results: DG Abd Portable 1V  Result Date: 09/02/2020 CLINICAL DATA:  Small-bowel obstruction. EXAM: PORTABLE ABDOMEN - 1 VIEW COMPARISON:  09/01/2020. FINDINGS: Upper abdomen not imaged. Continued progression of prominent small bowel distention with a paucity of colonic gas. Findings again consistent with persistent small-bowel obstruction. No free air identified. Degenerative changes lumbar spine and both hips. IMPRESSION: Continued progression of prominent small bowel distention. Findings consistent with persistent small-bowel obstruction. Electronically Signed   By: Marcello Moores  Register   On: 09/02/2020 08:50   DG Abd Portable 1V  Result Date: 09/01/2020 CLINICAL DATA:  Small-bowel obstruction. EXAM: PORTABLE ABDOMEN - 1 VIEW COMPARISON:  08/31/2020.  08/30/2020.  CT 08/28/2020. FINDINGS: NG tube noted with tip in stable position over the upper stomach. Progressive small-bowel distention. Small-bowel dilatation up to 5.7 cm noted. Colon is nondistended.  No free air noted. No acute bony abnormality. IMPRESSION: 1. NG tube noted with tip in stable position over the upper stomach. 2. Progressive small-bowel distention. Findings again consistent with small bowel obstruction. Electronically Signed   By: Marcello Moores  Register   On: 09/01/2020 06:21   DG Abd Portable 1V  Result Date: 08/31/2020 CLINICAL DATA:  Nasogastric tube placement EXAM: PORTABLE ABDOMEN - 1 VIEW COMPARISON:  None. FINDINGS: Nasogastric tube tip within the mid body of the stomach. Visualized abdominal gas pattern is unremarkable. No gross free  intraperitoneal gas. Right flank and pelvis excluded from view. IMPRESSION: Nasogastric tube tip within the mid body of the stomach. Electronically Signed   By: Fidela Salisbury MD   On: 08/31/2020 17:55    Anti-infectives: Anti-infectives (From admission, onward)    Start     Dose/Rate Route Frequency Ordered Stop   08/29/20 0600  cefTRIAXone (ROCEPHIN) 1 g in sodium chloride 0.9 % 100 mL IVPB        1 g 200 mL/hr over 30 Minutes Intravenous Every 24 hours 08/29/20 0204 08/31/20 0730   08/28/20 2300  piperacillin-tazobactam (ZOSYN) IVPB 3.375 g        3.375 g 100 mL/hr over 30 Minutes Intravenous  Once 08/28/20 2245 08/28/20 2342        Assessment/Plan Left upper quadrant abdominal pain Partial small bowel obstruction-inflamed small bowel loop - He has a remote history of perforated sigmoid diverticulitis and an incarcerated left inguinal hernia for which he underwent urgent sigmoid colectomy with colostomy and and repair of a incarcerated left indirect inguinal hernia by Dr. Dalbert Batman in 2010.  He subsequently underwent reversal in 2011 with takedown and closure of colostomy, lysis of adhesions and repair of parastomal hernia with Stratus mesh. - CT scan noted possible closed loop. Was not felt to have surgical abdomen on admission. - SBO protocol - gastrografin was given 7/16 AM. Film this AM with progressive SBO although clinically patient now passing some flatus and pain improved. Hold heparin gtt until Dr. Georgette Dover can evaluate - likely OR today  - will order PICC and TPN to start - Keep K > 4 and Mg >2 for bowel function - Mobilize for bowel function     FEN - NPO, NGT to LIWS, IVF per TRH VTE - SCDs, heparin gtt held  ID - Rocephin for UTI completed   A. Fib on Eliquis - on hold  History of CVA January 2022 UTI Hypertension Hyperlipidemia  LOS: 4 days    Norm Parcel, [facility] 09/02/2020, 10:27 AM Please see Amion for pager number during day hours  7:00am-4:30pm

## 2020-09-02 NOTE — Progress Notes (Signed)
PHARMACY - TOTAL PARENTERAL NUTRITION CONSULT NOTE  Indication: SBO  Patient Measurements: Height: 6' (182.9 cm) Weight: 96.5 kg (212 lb 11.9 oz) IBW/kg (Calculated) : 77.6 TPN AdjBW (KG): 82.8 Body mass index is 28.85 kg/m. Usual Weight: 97-100 kg  Assessment:  79 YOM presented on 7/15 with acute abdominal pain.  PMH significant for GERD, bowel perforation in 2010, colostomy reversal in 2011 and SBO in 2016.  CT showed possible closed loop.  Initially treated with conservative management, but abdominal xray shows progressive SB distention consistent with SBO.  Pharmacy consulted to TPN management given likely surgery.  Patient denies recent weight loss and reports eating 2 meals a day.  He usually eats fruits for breakfast, fast food for lunch ([employer], McDonald's, etc) and 1-2 meals per week for supper, which is usually a sandwich.    Glucose / Insulin: no hx DM - CBGs low normal on BMET Electrolytes: all WNL Renal: SCr 1.09, BUN 34 Hepatic: LFTs WNL, albumin 4.2 on admit, prealbumin 14.5 Intake / Output; MIVF: UOP 0.1 ml/kg/hr, NG 630mL GI Imaging: none since TPN GI Surgeries / Procedures:   7/20 pending  Central access: PICC to be placed 09/02/20 TPN start date: 09/02/20  Nutritional Goals (RD rec pending): 2300-2500 kCal, 125-145g AA per day  Current Nutrition:  NPO  Plan:  Start TPN at 32mL/hr at 1800 (goal rate 100 ml/hr) Electrolytes in TPN: Na 77mEq/L, K 94mEq/L, Ca 65mEq/L, Mg 48mEq/L, Phos 72mmol/L, max acetate for now Add standard MVI and trace elements to TPN Initiate sensitive SSI Q6H Standard TPN labs and nursing care orders  Darroll Bredeson D. Mina Marble, PharmD, BCPS, [place] 09/02/2020, 11:05 AM

## 2020-09-02 NOTE — Progress Notes (Signed)
Peripherally Inserted Central Catheter Placement  The IV Nurse has discussed with the patient and/or persons authorized to consent for the patient, the purpose of this procedure and the potential benefits and risks involved with this procedure.  The benefits include less needle sticks, lab draws from the catheter, and the patient may be discharged home with the catheter. Risks include, but not limited to, infection, bleeding, blood clot (thrombus formation), and puncture of an artery; nerve damage and irregular heartbeat and possibility to perform a PICC exchange if needed/ordered by physician.  Alternatives to this procedure were also discussed.  Bard Power PICC patient education guide, fact sheet on infection prevention and patient information card has been provided to patient /or left at bedside. Consent signed by spouse at bedside.  PICC Placement Documentation  PICC Triple Lumen 93/79/02 PICC Right Basilic 44 cm 0 cm (Active)  Indication for Insertion or Continuance of Line Administration of hyperosmolar/irritating solutions (i.e. TPN, Vancomycin, etc.) 09/02/20 2122  Exposed Catheter (cm) 0 cm 09/02/20 2122  Site Assessment Clean;Dry;Intact 09/02/20 2122  Lumen #1 Status Saline locked;Flushed;Blood return noted 09/02/20 2122  Lumen #2 Status Saline locked;Flushed;Blood return noted 09/02/20 2122  Lumen #3 Status Saline locked;Flushed;Blood return noted 09/02/20 2122  Dressing Type Transparent;Securing device 09/02/20 2122  Dressing Status Clean;Dry;Intact 09/02/20 2122  Antimicrobial disc in place? Yes 09/02/20 2122  Safety Lock Not Applicable 40/97/35 3299  Line Care Connections checked and tightened 09/02/20 2122  Line Adjustment (NICU/IV Team Only) No 09/02/20 2122  Dressing Change Due 09/09/20 09/02/20 2122       William Norman 09/02/2020, 9:23 PM

## 2020-09-02 NOTE — Progress Notes (Signed)
Patient placement notified that pt needs higher level of care.  All his belongings are brought back to the nurses station as soon as we know the unit and room number we will send them.

## 2020-09-02 NOTE — Transfer of Care (Signed)
Immediate Anesthesia Transfer of Care Note  Patient: William Norman  Procedure(s) Performed: EXPLORATORY LAPAROTOMY, LYSIS OF ADHESIONS ,BOWEL RESECTION (Abdomen) APPLICATION OF WOUND VAC (Abdomen)  Patient Location: PACU  Anesthesia Type:General  Level of Consciousness: awake, alert  and oriented  Airway & Oxygen Therapy: Patient Spontanous Breathing and Patient connected to face mask oxygen  Post-op Assessment: Report given to RN, Post -op Vital signs reviewed and stable and Patient moving all extremities  Post vital signs: Reviewed and stable  Last Vitals:  Vitals Value Taken Time  BP 154/85 09/02/20 1615  Temp    Pulse 90 09/02/20 1618  Resp 29 09/02/20 1618  SpO2 98 % 09/02/20 1618  Vitals shown include unvalidated device data.  Last Pain:  Vitals:   09/02/20 1232  TempSrc:   PainSc: 0-No pain      Patients Stated Pain Goal: 0 (85/46/27 0350)  Complications: No notable events documented.

## 2020-09-02 NOTE — Progress Notes (Signed)
Spoke with Primary RN about PICC placement and she informed me that patient is already off floor to OR. We will prioritize PICC placements and will attempt to place PICC today once patient is out of OR and stable.

## 2020-09-02 NOTE — Anesthesia Postprocedure Evaluation (Signed)
Anesthesia Post Note  Patient: William Norman  Procedure(s) Performed: EXPLORATORY LAPAROTOMY, LYSIS OF ADHESIONS ,BOWEL RESECTION (Abdomen) APPLICATION OF WOUND VAC (Abdomen)     Patient location during evaluation: PACU Anesthesia Type: General Level of consciousness: awake and alert Pain management: pain level controlled Vital Signs Assessment: post-procedure vital signs reviewed and stable Respiratory status: spontaneous breathing, nonlabored ventilation, respiratory function stable and patient connected to nasal cannula oxygen Cardiovascular status: blood pressure returned to baseline and stable Postop Assessment: no apparent nausea or vomiting Anesthetic complications: no   No notable events documented.  Last Vitals:  Vitals:   09/02/20 1220 09/02/20 1615  BP: (!) 171/99 (!) 154/85  Pulse: 76 96  Resp: 18 (!) 24  Temp: 36.7 C (!) 36.1 C  SpO2: 93% 99%    Last Pain:  Vitals:   09/02/20 1615  TempSrc:   PainSc: 7                  Lidia Collum

## 2020-09-02 NOTE — Progress Notes (Signed)
PROGRESS NOTE    William Norman  VQQ:[id number] DOB: May 19, 1940 DOA: 08/28/2020 PCP: Vivi Barrack, MD   Brief Narrative:  William Norman is a 80 y.o. male with medical history significant for atrial fibrillation on Eliquis, CAD, history of CVA, hypertension, perforated bowel in 2010, colostomy reversal in 2011, and SBO in 2016 that resolved with conservative management, now presenting with abdominal pain. He had been in his usual state of health and normal BM the am of 08/28/20 before developing acute-onset of abdominal pain at approximately 2 pm. Pain was initially "6/10" and aching in character but he went on to develop fleeting, sharp, "10/10" pains every 30 seconds or so. He reports associated nausea without vomiting and abdominal distension. He denies fevers, diarrhea, chest pain, cough, or SOB. Last dose of Eliquis was the morning of 08/28/20.  Assessment & Plan:   Principal Problem:   SBO (small bowel obstruction) (HCC) Active Problems:   Essential hypertension   Personal history of DVT (deep vein thrombosis)   Persistent atrial fibrillation (HCC)   CAD (coronary artery disease)   History of ischemic stroke   Recurrent UTI   SBO, ongoing  - Patient being followed by general surgery, appreciate insight recommendations - Imaging appears to be worsening over the past 48 hours, clinically patient appears stable but no longer having any flatus or bowel movements, surgery likely to take to the OR today for exploratory lap and potential small bowel resection pending findings -Discussed addition of TPN to patient's regimen given his current n.p.o. status and likely prolonged ileus postsurgically -appreciate pharmacy insight and recommendations - Abdominal history complicated by bowel perforation in 2010, colostomy reversal in 2011, and SBO in 2016 that resolved with conservative management   UTI, POA, does not meet sepsis criteria, resolved -Abnormal UA questionable  symptoms of increased frequency -3 days of ceftriaxone completed  Atrial fibrillation, rate controlled - CHADS-VASc 6  - Last dose of Eliquis was am of 08/28/20 -we will continue to hold postoperatively - Continue IV heparin  History of CAD  - No anginal complaints  - Resume statin and beta-blocker once his condition allows     History of CVA  - Resume statin and Eliquis once his condition allows     Hypertension  -Currently normotensive    DVT prophylaxis: Heparin gtt -held perioperatively today Code Status: Full Family Communication: None present in  Status is: Patient  Dispo: The patient is from: Home              Anticipated d/c is to: Home              Anticipated d/c date is: >72 hours pending resolution of obstruction              Patient currently not medically stable for discharge  Consultants:  General surgery  Procedures:  None  Antimicrobials:  Ceftriaxone x3 days completed  Subjective: No acute issues or events overnight, no further episodes of flatus or bowel movements since yesterday  Objective: Vitals:   09/01/20 1125 09/01/20 1814 09/01/20 2157 09/02/20 0607  BP: 122/80 (!) 155/89 (!) 141/102 (!) 155/98  Pulse: 85 94 (!) 106 89  Resp: 18 18 20 18   Temp: 98 F (36.7 C) 98.1 F (36.7 C) 98.2 F (36.8 C) 98.2 F (36.8 C)  TempSrc: Oral Oral Oral Oral  SpO2: 93% 92% 90% 91%  Weight:      Height:        Intake/Output Summary (Last 24  hours) at 09/02/2020 0751 Last data filed at 09/02/2020 0616 Gross per 24 hour  Intake --  Output 875 ml  Net -875 ml    Filed Weights   08/29/20 0100 08/30/20 0501 09/01/20 0457  Weight: 97.1 kg 99.1 kg 96.5 kg    Examination:  General:  Pleasantly resting in bed, No acute distress. HEENT:  Normocephalic atraumatic.  Sclerae nonicteric, noninjected.  Extraocular movements intact bilaterally. NG to suction - bright green fluid in container Neck:  Without mass or deformity.  Trachea is midline. Lungs:   Clear to auscultate bilaterally without rhonchi, wheeze, or rales. Heart:  Regular rate and rhythm.  Without murmurs, rubs, or gallops. Abdomen:  Soft, nontender, minimally distended, notable scars consistent with surgical history.  Without guarding or rebound. Extremities: Without cyanosis, clubbing, edema, or obvious deformity. Vascular:  Dorsalis pedis and posterior tibial pulses palpable bilaterally. Skin:  Warm and dry, no erythema, no ulcerations.  Data Reviewed: I have personally reviewed following labs and imaging studies  CBC: Recent Labs  Lab 08/29/20 0241 08/30/20 0031 08/31/20 0521 09/01/20 0358 09/02/20 0508  WBC 10.6* 12.2* 11.0* 10.7* 7.9  HGB 15.4 16.5 15.9 16.8 16.0  HCT 47.1 50.2 50.2 51.7 48.2  MCV 90.6 90.1 93.7 90.9 89.8  PLT 181 205 171 202 505    Basic Metabolic Panel: Recent Labs  Lab 08/28/20 1931 08/29/20 0241 08/30/20 0031 08/31/20 0521 09/01/20 0358  NA 140 138 136 138 140  K 4.0 4.3 3.8 3.9 3.3*  CL 105 106 100 103 101  CO2 28 25 25 23 29   GLUCOSE 97 122* 117* 89 88  BUN 17 16 14 19 23   CREATININE 0.86 0.85 0.98 0.92 0.99  CALCIUM 9.2 9.3 9.4 9.3 9.6    GFR: Estimated Creatinine Clearance: 71.7 mL/min (by C-G formula based on SCr of 0.99 mg/dL). Liver Function Tests: Recent Labs  Lab 08/28/20 1931  AST 24  ALT 18  ALKPHOS 52  BILITOT 1.2  PROT 6.8  ALBUMIN 4.2    Recent Labs  Lab 08/28/20 1931  LIPASE 15    No results for input(s): AMMONIA in the last 168 hours. Coagulation Profile: No results for input(s): INR, PROTIME in the last 168 hours. Cardiac Enzymes: No results for input(s): CKTOTAL, CKMB, CKMBINDEX, TROPONINI in the last 168 hours. BNP (last 3 results) No results for input(s): PROBNP in the last 8760 hours. HbA1C: No results for input(s): HGBA1C in the last 72 hours. CBG: No results for input(s): GLUCAP in the last 168 hours. Lipid Profile: No results for input(s): CHOL, HDL, LDLCALC, TRIG, CHOLHDL,  LDLDIRECT in the last 72 hours. Thyroid Function Tests: No results for input(s): TSH, T4TOTAL, FREET4, T3FREE, THYROIDAB in the last 72 hours. Anemia Panel: No results for input(s): VITAMINB12, FOLATE, FERRITIN, TIBC, IRON, RETICCTPCT in the last 72 hours. Sepsis Labs: Recent Labs  Lab 08/28/20 2322 08/29/20 0215  LATICACIDVEN 1.9 1.7     Recent Results (from the past 240 hour(s))  Culture, blood (Routine X 2) w Reflex to ID Panel     Status: None (Preliminary result)   Collection Time: 08/28/20 10:50 PM   Specimen: BLOOD  Result Value Ref Range Status   Specimen Description BLOOD LEFT ANTECUBITAL  Final   Special Requests   Final    BOTTLES DRAWN AEROBIC AND ANAEROBIC Blood Culture adequate volume   Culture   Final    NO GROWTH 3 DAYS Performed at [facility], [place] [address], [place], [place] [address]  Report Status PENDING  Incomplete  Culture, blood (Routine X 2) w Reflex to ID Panel     Status: None (Preliminary result)   Collection Time: 08/28/20 11:00 PM   Specimen: BLOOD  Result Value Ref Range Status   Specimen Description BLOOD FEMORAL ARTERY RIGHT  Final   Special Requests   Final    BOTTLES DRAWN AEROBIC AND ANAEROBIC Blood Culture adequate volume   Culture   Final    NO GROWTH 3 DAYS Performed at [facility], [address] [place], [place] [address]    Report Status PENDING  Incomplete  Urine Culture     Status: Abnormal   Collection Time: 08/28/20 11:22 PM   Specimen: Urine, Random  Result Value Ref Range Status   Specimen Description   Final    URINE, RANDOM Performed at [facility], [address], [place], [place] [address]    Special Requests   Final    NONE Performed at [facility], [address] [place], [place] [address]    Culture MULTIPLE SPECIES PRESENT, SUGGEST RECOLLECTION (A)  Final   Report Status 08/30/2020 FINAL  Final  Resp Panel by RT-PCR (Flu A&B, Covid)     Status: None    Collection Time: 08/28/20 11:22 PM   Specimen: Nasopharyngeal(NP) swabs in vial transport medium  Result Value Ref Range Status   SARS Coronavirus 2 by RT PCR NEGATIVE NEGATIVE Final    Comment: (NOTE) SARS-CoV-2 target nucleic acids are NOT DETECTED.  The SARS-CoV-2 RNA is generally detectable in upper respiratory specimens during the acute phase of infection. The lowest concentration of SARS-CoV-2 viral copies this assay can detect is 138 copies/mL. A negative result does not preclude SARS-Cov-2 infection and should not be used as the sole basis for treatment or other patient management decisions. A negative result may occur with  improper specimen collection/handling, submission of specimen other than nasopharyngeal swab, presence of viral mutation(s) within the areas targeted by this assay, and inadequate number of viral copies(<138 copies/mL). A negative result must be combined with clinical observations, patient history, and epidemiological information. The expected result is Negative.  Fact Sheet for Patients:  EntrepreneurPulse.com.au  Fact Sheet for Healthcare Providers:  IncredibleEmployment.be  This test is no t yet approved or cleared by the [place] FDA and  has been authorized for detection and/or diagnosis of SARS-CoV-2 by FDA under an Emergency Use Authorization (EUA). This EUA will remain  in effect (meaning this test can be used) for the duration of the COVID-19 declaration under Section 564(b)(1) of the Act, 21 U.S.C.section 360bbb-3(b)(1), unless the authorization is terminated  or revoked sooner.       Influenza A by PCR NEGATIVE NEGATIVE Final   Influenza B by PCR NEGATIVE NEGATIVE Final    Comment: (NOTE) The Xpert Xpress SARS-CoV-2/FLU/RSV plus assay is intended as an aid in the diagnosis of influenza from Nasopharyngeal swab specimens and should not be used as a sole basis for treatment. Nasal washings  and aspirates are unacceptable for Xpert Xpress SARS-CoV-2/FLU/RSV testing.  Fact Sheet for Patients: EntrepreneurPulse.com.au  Fact Sheet for Healthcare Providers: IncredibleEmployment.be  This test is not yet approved or cleared by the [place] FDA and has been authorized for detection and/or diagnosis of SARS-CoV-2 by FDA under an Emergency Use Authorization (EUA). This EUA will remain in effect (meaning this test can be used) for the duration of the COVID-19 declaration under Section 564(b)(1) of the Act, 21 U.S.C. section 360bbb-3(b)(1), unless  the authorization is terminated or revoked.  Performed at [facility], [address], [place], [place] [address]   Culture, blood (routine x 2)     Status: None (Preliminary result)   Collection Time: 08/29/20  2:15 AM   Specimen: BLOOD  Result Value Ref Range Status   Specimen Description BLOOD RIGHT ANTECUBITAL  Final   Special Requests   Final    BOTTLES DRAWN AEROBIC AND ANAEROBIC Blood Culture adequate volume   Culture   Final    NO GROWTH 4 DAYS Performed at [facility], [place] [address], [place], [place] [address]    Report Status PENDING  Incomplete  Culture, blood (routine x 2)     Status: None (Preliminary result)   Collection Time: 08/29/20  2:22 AM   Specimen: BLOOD LEFT HAND  Result Value Ref Range Status   Specimen Description BLOOD LEFT HAND  Final   Special Requests   Final    BOTTLES DRAWN AEROBIC AND ANAEROBIC Blood Culture adequate volume   Culture   Final    NO GROWTH 4 DAYS Performed at [facility], [place] [address], [place], [place] [address]    Report Status PENDING  Incomplete  Surgical pcr screen     Status: None   Collection Time: 09/01/20  8:09 AM   Specimen: Nasal Mucosa; Nasal Swab  Result Value Ref Range Status   MRSA, PCR NEGATIVE NEGATIVE Final   Staphylococcus aureus NEGATIVE NEGATIVE Final    Comment: (NOTE) The  Xpert SA Assay (FDA approved for NASAL specimens in patients 41 years of age and older), is one component of a comprehensive surveillance program. It is not intended to diagnose infection nor to guide or monitor treatment. Performed at [facility], [place] [address], [place], [place] [address]    Radiology Studies: DG Abd Portable 1V  Result Date: 09/01/2020 CLINICAL DATA:  Small-bowel obstruction. EXAM: PORTABLE ABDOMEN - 1 VIEW COMPARISON:  08/31/2020.  08/30/2020.  CT 08/28/2020. FINDINGS: NG tube noted with tip in stable position over the upper stomach. Progressive small-bowel distention. Small-bowel dilatation up to 5.7 cm noted. Colon is nondistended. No free air noted. No acute bony abnormality. IMPRESSION: 1. NG tube noted with tip in stable position over the upper stomach. 2. Progressive small-bowel distention. Findings again consistent with small bowel obstruction. Electronically Signed   By: Marcello Moores  Register   On: 09/01/2020 06:21   DG Abd Portable 1V  Result Date: 08/31/2020 CLINICAL DATA:  Nasogastric tube placement EXAM: PORTABLE ABDOMEN - 1 VIEW COMPARISON:  None. FINDINGS: Nasogastric tube tip within the mid body of the stomach. Visualized abdominal gas pattern is unremarkable. No gross free intraperitoneal gas. Right flank and pelvis excluded from view. IMPRESSION: Nasogastric tube tip within the mid body of the stomach. Electronically Signed   By: Fidela Salisbury MD   On: 08/31/2020 17:55     Scheduled Meds:  metoCLOPramide (REGLAN) injection  10 mg Intravenous Q6H   Continuous Infusions:  sodium chloride 250 mL (08/31/20 0522)   heparin 1,350 Units/hr (09/01/20 1015)     LOS: 4 days   Time spent: 63min  William Bobo C Yarisa Lynam, DO [facility]  If 7PM-7AM, please contact night-coverage [email or web address]  09/02/2020, 7:51 AM

## 2020-09-02 NOTE — Care Management Important Message (Signed)
Important Message  Patient Details  Name: William Norman MRN: [id number] Date of Birth: December 01, 1940   Medicare Important Message Given:  Yes     Orbie Pyo 09/02/2020, 3:19 PM

## 2020-09-02 NOTE — Progress Notes (Signed)
[place] for apixaban>> heparin Indication: atrial fibrillation and bridging while NPO for SBO  Allergies  Allergen Reactions   Other Other (See Comments)    Some form of numbing medication used by dentist, almost died   Simvastatin Other (See Comments)    Muscle aches     Patient Measurements: Height: 6' (182.9 cm) Weight: 96.5 kg (212 lb 11.9 oz) IBW/kg (Calculated) : 77.6 Heparin Dosing Weight: 97.4 kg   Vital Signs: Temp: 98.2 F (36.8 C) (07/20 0607) Temp Source: Oral (07/20 0607) BP: 155/98 (07/20 0607) Pulse Rate: 89 (07/20 0607)  Labs: Recent Labs    08/31/20 0521 08/31/20 0655 09/01/20 0358 09/02/20 0508  HGB 15.9  --  16.8 16.0  HCT 50.2  --  51.7 48.2  PLT 171  --  202 191  HEPARINUNFRC  --  0.35 0.42 0.49  CREATININE 0.92  --  0.99  --      Estimated Creatinine Clearance: 71.7 mL/min (by C-G formula based on SCr of 0.99 mg/dL).   Assessment: 59 yof with known hx of Afib presented with ab pain - now found to have SBO and is NPO, requiring parenteral anticoagulation for atrial fibrillation (CHADSVASC 6). Last dose apixaban on 7/15@1200 .   Heparin level therapeutic at 0.49 on 1350 units/hr. CBC stable. No signs of bleeding.    Goal of Therapy:  Heparin level 0.3-0.7 units/ml Monitor platelets by anticoagulation protocol: Yes   Plan:  Continue heparin infusion at 1350 units/hr  Daily heparin level, and CBC  Dinah Lupa A. Levada Dy, PharmD, BCPS, FNKF Clinical Pharmacist [place] Please utilize Amion for appropriate phone number to reach the unit pharmacist ([place])

## 2020-09-02 NOTE — Anesthesia Preprocedure Evaluation (Addendum)
Anesthesia Evaluation  Patient identified by MRN, date of birth, ID band Patient awake    Reviewed: Allergy & Precautions, NPO status , Patient's Chart, lab work & pertinent test results, reviewed documented beta blocker date and time   History of Anesthesia Complications Negative for: history of anesthetic complications  Airway Mallampati: I  TM Distance: >3 FB Neck ROM: Full    Dental no notable dental hx. (+) Dental Advisory Given, Missing, Chipped, Edentulous Upper,    Pulmonary former smoker,  60 pack year history    Pulmonary exam normal breath sounds clear to auscultation       Cardiovascular hypertension, Pt. on medications and Pt. on home beta blockers + CAD and + DVT  Normal cardiovascular exam+ dysrhythmias (eliquis LD 7/5, heparing gtt off since 10am today) Atrial Fibrillation  Rhythm:Regular Rate:Normal   '22 TTE - EF 60 to 65%. Grade I diastolic dysfunction (impaired relaxation). Left atrial size was severely dilated. Trivial mitral valve regurgitation. Aortic valve regurgitation is mild.    Neuro/Psych CVA, No Residual Symptoms negative psych ROS   GI/Hepatic Neg liver ROS, GERD  Controlled,  Endo/Other  negative endocrine ROS  Renal/GU negative Renal ROS  negative genitourinary   Musculoskeletal  (+) Arthritis ,   Abdominal   Peds  Hematology  On eliquis    Anesthesia Other Findings   Reproductive/Obstetrics negative OB ROS                            Anesthesia Physical Anesthesia Plan  ASA: 3 and emergent  Anesthesia Plan: General   Post-op Pain Management:    Induction: Intravenous, Rapid sequence and Cricoid pressure planned  PONV Risk Score and Plan: 3 and Treatment may vary due to age or medical condition, Ondansetron and Dexamethasone  Airway Management Planned: Oral ETT  Additional Equipment: None  Intra-op Plan:   Post-operative Plan: Extubation in  OR  Informed Consent: I have reviewed the patients History and Physical, chart, labs and discussed the procedure including the risks, benefits and alternatives for the proposed anesthesia with the patient or authorized representative who has indicated his/her understanding and acceptance.     Dental advisory given  Plan Discussed with: CRNA and Anesthesiologist  Anesthesia Plan Comments:        Anesthesia Quick Evaluation

## 2020-09-02 NOTE — Progress Notes (Addendum)
CXR result received, PICC line pulled back. Repeat CXR ordered to confirm tip placement for TPN.

## 2020-09-03 ENCOUNTER — Encounter (HOSPITAL_COMMUNITY): Payer: Self-pay | Admitting: Surgery

## 2020-09-03 DIAGNOSIS — K56609 Unspecified intestinal obstruction, unspecified as to partial versus complete obstruction: Secondary | ICD-10-CM | POA: Diagnosis not present

## 2020-09-03 DIAGNOSIS — E43 Unspecified severe protein-calorie malnutrition: Secondary | ICD-10-CM | POA: Insufficient documentation

## 2020-09-03 LAB — CBC
HCT: 43.3 % (ref 39.0–52.0)
Hemoglobin: 14.3 g/dL (ref 13.0–17.0)
MCH: 30.8 pg (ref 26.0–34.0)
MCHC: 33 g/dL (ref 30.0–36.0)
MCV: 93.1 fL (ref 80.0–100.0)
Platelets: 131 10*3/uL — ABNORMAL LOW (ref 150–400)
RBC: 4.65 MIL/uL (ref 4.22–5.81)
RDW: 14.1 % (ref 11.5–15.5)
WBC: 8.9 10*3/uL (ref 4.0–10.5)
nRBC: 0 % (ref 0.0–0.2)

## 2020-09-03 LAB — CULTURE, BLOOD (ROUTINE X 2)
Culture: NO GROWTH
Culture: NO GROWTH
Culture: NO GROWTH
Culture: NO GROWTH
Special Requests: ADEQUATE
Special Requests: ADEQUATE
Special Requests: ADEQUATE
Special Requests: ADEQUATE

## 2020-09-03 LAB — DIFFERENTIAL
Abs Immature Granulocytes: 0.06 10*3/uL (ref 0.00–0.07)
Basophils Absolute: 0 10*3/uL (ref 0.0–0.1)
Basophils Relative: 0 %
Eosinophils Absolute: 0 10*3/uL (ref 0.0–0.5)
Eosinophils Relative: 0 %
Immature Granulocytes: 1 %
Lymphocytes Relative: 8 %
Lymphs Abs: 0.7 10*3/uL (ref 0.7–4.0)
Monocytes Absolute: 1.3 10*3/uL — ABNORMAL HIGH (ref 0.1–1.0)
Monocytes Relative: 15 %
Neutro Abs: 6.9 10*3/uL (ref 1.7–7.7)
Neutrophils Relative %: 76 %

## 2020-09-03 LAB — COMPREHENSIVE METABOLIC PANEL
ALT: 24 U/L (ref 0–44)
AST: 32 U/L (ref 15–41)
Albumin: 2.5 g/dL — ABNORMAL LOW (ref 3.5–5.0)
Alkaline Phosphatase: 30 U/L — ABNORMAL LOW (ref 38–126)
Anion gap: 5 (ref 5–15)
BUN: 25 mg/dL — ABNORMAL HIGH (ref 8–23)
CO2: 28 mmol/L (ref 22–32)
Calcium: 8.1 mg/dL — ABNORMAL LOW (ref 8.9–10.3)
Chloride: 111 mmol/L (ref 98–111)
Creatinine, Ser: 0.84 mg/dL (ref 0.61–1.24)
GFR, Estimated: >60 mL/min (ref >60)
Glucose, Bld: 162 mg/dL — ABNORMAL HIGH (ref 70–99)
Potassium: 4.9 mmol/L (ref 3.5–5.1)
Sodium: 144 mmol/L (ref 135–145)
Total Bilirubin: 1 mg/dL (ref 0.3–1.2)
Total Protein: 4.8 g/dL — ABNORMAL LOW (ref 6.5–8.1)

## 2020-09-03 LAB — GLUCOSE, CAPILLARY
Glucose-Capillary: 185 mg/dL — ABNORMAL HIGH (ref 70–99)
Glucose-Capillary: 153 mg/dL — ABNORMAL HIGH (ref 70–99)
Glucose-Capillary: 139 mg/dL — ABNORMAL HIGH (ref 70–99)
Glucose-Capillary: 143 mg/dL — ABNORMAL HIGH (ref 70–99)
Glucose-Capillary: 151 mg/dL — ABNORMAL HIGH (ref 70–99)

## 2020-09-03 LAB — HEPARIN LEVEL (UNFRACTIONATED)
Heparin Unfractionated: <0.1 IU/mL — ABNORMAL LOW (ref 0.30–0.70)
Heparin Unfractionated: <0.1 IU/mL — ABNORMAL LOW (ref 0.30–0.70)
Heparin Unfractionated: 0.17 IU/mL — ABNORMAL LOW (ref 0.30–0.70)

## 2020-09-03 LAB — PHOSPHORUS: Phosphorus: 2.4 mg/dL — ABNORMAL LOW (ref 2.5–4.6)

## 2020-09-03 LAB — MAGNESIUM: Magnesium: 1.9 mg/dL (ref 1.7–2.4)

## 2020-09-03 LAB — TRIGLYCERIDES: Triglycerides: 255 mg/dL — ABNORMAL HIGH (ref <150)

## 2020-09-03 MED ORDER — METHOCARBAMOL 1000 MG/10ML IJ SOLN
1000.0000 mg | Freq: Three times a day (TID) | INTRAVENOUS | Status: DC
  Administered 2020-09-03 – 2020-09-11 (×24): 1000 mg via INTRAVENOUS
  Filled 2020-09-03: qty 1000
  Filled 2020-09-03: qty 10
  Filled 2020-09-03 (×2): qty 1000
  Filled 2020-09-03: qty 10
  Filled 2020-09-03: qty 1000
  Filled 2020-09-03 (×3): qty 10
  Filled 2020-09-03 (×4): qty 1000
  Filled 2020-09-03 (×3): qty 10
  Filled 2020-09-03: qty 1000
  Filled 2020-09-03: qty 10
  Filled 2020-09-03: qty 1000
  Filled 2020-09-03 (×2): qty 10
  Filled 2020-09-03 (×2): qty 1000
  Filled 2020-09-03 (×4): qty 10
  Filled 2020-09-03: qty 1000

## 2020-09-03 MED ORDER — SODIUM PHOSPHATES 45 MMOLE/15ML IV SOLN
20.0000 mmol | Freq: Once | INTRAVENOUS | Status: AC
  Administered 2020-09-03: 20 mmol via INTRAVENOUS
  Filled 2020-09-03: qty 6.67

## 2020-09-03 MED ORDER — HYDROMORPHONE HCL 1 MG/ML IJ SOLN
0.5000 mg | INTRAMUSCULAR | Status: DC | PRN
  Administered 2020-09-03: 0.5 mg via INTRAVENOUS
  Administered 2020-09-03 – 2020-09-06 (×10): 1 mg via INTRAVENOUS
  Administered 2020-09-09 (×2): 0.5 mg via INTRAVENOUS
  Administered 2020-09-10 (×2): 1 mg via INTRAVENOUS
  Filled 2020-09-03 (×17): qty 1

## 2020-09-03 MED ORDER — LACTATED RINGERS IV SOLN: INTRAVENOUS | Status: DC

## 2020-09-03 MED ORDER — PANTOPRAZOLE SODIUM 40 MG IV SOLR
40.0000 mg | INTRAVENOUS | Status: DC
  Administered 2020-09-03 – 2020-09-17 (×15): 40 mg via INTRAVENOUS
  Filled 2020-09-03 (×16): qty 40

## 2020-09-03 MED ORDER — TRAVASOL 10 % IV SOLN
INTRAVENOUS | Status: AC
  Filled 2020-09-03: qty 904.8

## 2020-09-03 MED ORDER — LIDOCAINE 5 % EX PTCH
1.0000 | MEDICATED_PATCH | CUTANEOUS | Status: DC
  Administered 2020-09-03 – 2020-09-09 (×7): 1 via TRANSDERMAL
  Filled 2020-09-03 (×15): qty 1

## 2020-09-03 MED ORDER — LACTATED RINGERS IV SOLN: INTRAVENOUS | Status: AC

## 2020-09-03 MED ORDER — INSULIN ASPART 100 UNIT/ML IJ SOLN
0.0000 [IU] | INTRAMUSCULAR | Status: DC
  Administered 2020-09-03 (×2): 2 [IU] via SUBCUTANEOUS
  Administered 2020-09-03 (×2): 3 [IU] via SUBCUTANEOUS
  Administered 2020-09-04 – 2020-09-05 (×3): 2 [IU] via SUBCUTANEOUS
  Administered 2020-09-05: 3 [IU] via SUBCUTANEOUS
  Administered 2020-09-05 – 2020-09-06 (×6): 2 [IU] via SUBCUTANEOUS
  Administered 2020-09-06: 3 [IU] via SUBCUTANEOUS
  Administered 2020-09-06 – 2020-09-07 (×4): 2 [IU] via SUBCUTANEOUS

## 2020-09-03 MED ORDER — SODIUM CHLORIDE 0.9 % IV SOLN: INTRAVENOUS | Status: DC

## 2020-09-03 MED ORDER — ACETAMINOPHEN 10 MG/ML IV SOLN
1000.0000 mg | Freq: Four times a day (QID) | INTRAVENOUS | Status: AC
  Administered 2020-09-03 – 2020-09-04 (×4): 1000 mg via INTRAVENOUS
  Filled 2020-09-03 (×4): qty 100

## 2020-09-03 MED ORDER — HEPARIN (PORCINE) 25000 UT/250ML-% IV SOLN
1850.0000 [IU]/h | INTRAVENOUS | Status: DC
  Administered 2020-09-03: 1300 [IU]/h via INTRAVENOUS
  Administered 2020-09-04: 1550 [IU]/h via INTRAVENOUS
  Administered 2020-09-04: 1400 [IU]/h via INTRAVENOUS
  Administered 2020-09-05 – 2020-09-07 (×5): 1850 [IU]/h via INTRAVENOUS
  Filled 2020-09-03 (×9): qty 250

## 2020-09-03 MED ORDER — MAGNESIUM SULFATE IN D5W 1-5 GM/100ML-% IV SOLN
1.0000 g | Freq: Once | INTRAVENOUS | Status: AC
  Administered 2020-09-03: 1 g via INTRAVENOUS
  Filled 2020-09-03: qty 100

## 2020-09-03 NOTE — Progress Notes (Addendum)
Initial Nutrition Assessment  DOCUMENTATION CODES:   Severe malnutrition in context of acute illness/injury  INTERVENTION:   TPN to meet 100% of nutrition needs as able.   Monitor magnesium, potassium, and phosphorus for at least 3 days, MD to replete as needed, as pt is at risk for refeeding syndrome given severe malnutrition with no oral intake x 5 days.  Recommend Thiamine 100 mg IV x 7 days due to refeeding risk.  NUTRITION DIAGNOSIS:   Severe Malnutrition related to acute illness (SBO with no oral intake x 5 days) as evidenced by moderate fat depletion, moderate muscle depletion, energy intake < or equal to 50% for > or equal to 5 days.  GOAL:   Patient will meet greater than or equal to 90% of their needs  MONITOR:   I & O's, Diet advancement, Labs  REASON FOR ASSESSMENT:   Consult New TPN/TNA  ASSESSMENT:   80 yo male admitted with small bowel obstruction. S/P ex lap with LOA, small bowel resection, VAC placement on 7/20. PMH includes perforated diverticulitis, CAD, HLD, HTN, BPH, GERD, A fib, stroke.  Patient has been NPO since admission x 5 days. Patient remains NPO post-op.  PTA, patient reports good intake, good appetite, stable weight. He typically eats a snack for breakfast (fruit), larger afternoon lunch usually at a fast food restaurant, and a sandwich for a snack in the evening.  TPN initiated yesterday. Currently infusing at 40 ml/h to provide 969 kcal, 52 gm protein per day.   Labs reviewed. Phos 2.4 (L), Triglycerides 255 (H) CBG: 185 this AM  Medications reviewed and include Novolog, Protonix. Receiving phosphorous repletion.   Admission weight 98.4 kg Current weight 96.5 kg Usual weights reviewed. 2.3 % weight loss in 5 days 7/14-->7/19.  Patient meets criteria for severe malnutrition in the context of acute illness with moderate depletion of muscle and subcutaneous fat mass with no oral intake x 5 days since admission.   NUTRITION - FOCUSED  PHYSICAL EXAM:  Flowsheet Row Most Recent Value  Orbital Region Moderate depletion  Upper Arm Region Moderate depletion  Thoracic and Lumbar Region No depletion  Buccal Region Moderate depletion  Temple Region Moderate depletion  Clavicle Bone Region Moderate depletion  Clavicle and Acromion Bone Region Mild depletion  Scapular Bone Region Mild depletion  Dorsal Hand Mild depletion  Patellar Region Moderate depletion  Anterior Thigh Region Moderate depletion  Posterior Calf Region Mild depletion  Edema (RD Assessment) None  Hair Reviewed  Eyes Reviewed  Mouth Reviewed  Skin Reviewed  Nails Reviewed       Diet Order:   Diet Order             Diet NPO time specified Except for: Ice Chips  Diet effective now                   EDUCATION NEEDS:   Not appropriate for education at this time  Skin:  Skin Assessment: Skin Integrity Issues: Skin Integrity Issues:: Wound VAC Wound Vac: surgical incision to abdomen  Last BM:  7/15  Height:   Ht Readings from Last 1 Encounters:  08/28/20 6' (1.829 m)    Weight:   Wt Readings from Last 1 Encounters:  09/01/20 96.5 kg    Ideal Body Weight:  80.9 kg  BMI:  Body mass index is 28.85 kg/m.  Estimated Nutritional Needs:   Kcal:  2200-2400  Protein:  125-150 gm  Fluid:  >/= 2.2 L    Lucas Mallow,  RD, LDN, CNSC Please refer to Amion for contact information.

## 2020-09-03 NOTE — Progress Notes (Signed)
[place] for apixaban>> heparin Indication: atrial fibrillation and bridging while NPO for SBO  Allergies  Allergen Reactions   Other Other (See Comments)    Some form of numbing medication used by dentist, almost died   Simvastatin Other (See Comments)    Muscle aches     Patient Measurements: Height: 6' (182.9 cm) Weight: 96.5 kg (212 lb 11.9 oz) IBW/kg (Calculated) : 77.6 Heparin Dosing Weight: 97.4 kg   Vital Signs: Temp: 98 F (36.7 C) (07/21 0744) Temp Source: Oral (07/21 0744) BP: 152/89 (07/21 0800) Pulse Rate: 92 (07/21 0800)  Labs: Recent Labs    09/01/20 0358 09/02/20 0508 09/03/20 0500 09/03/20 0641  HGB 16.8 16.0 14.3  --   HCT 51.7 48.2 43.3  --   PLT 202 191 131*  --   HEPARINUNFRC 0.42 0.49 <0.10*  --   CREATININE 0.99 1.09  --  0.84     Estimated Creatinine Clearance: 84.5 mL/min (by C-G formula based on SCr of 0.84 mg/dL).   Assessment: 29 yof with known hx of Afib presented with ab pain - now found to have SBO and is NPO, requiring parenteral anticoagulation for atrial fibrillation (CHADSVASC 6). Last dose apixaban on 7/15@1200 . Patient s/p ex-lap yesteday with SBO resection. Heparin held post operative per surgery. Pharmacy now consulted to resume heparin with no bolus. Patient previously therapeutic on heparin 1350 units/hr and monitoring via anti-Xa levels. Hgb 14.3. Plt 131 - decrease. No noted bleeding.   Goal of Therapy:  Heparin level 0.3-0.7 units/ml Monitor platelets by anticoagulation protocol: Yes   Plan:  Resume heparin at 1300 units/hr Check 8 hr heparin level  Monitor heparin level, CBC and s/s of bleeding daily  Cristela Felt, PharmD, BCPS Clinical Pharmacist 09/03/2020 8:47 AM

## 2020-09-03 NOTE — Progress Notes (Signed)
Physical Therapy Evaluation Patient Details Name: William Norman MRN: [id number] DOB: 1940-04-17 Today's Date: 09/03/2020   History of Present Illness  80 y.o. male presenting with abdominal pain 08/28/20. +small bowel obstruction, rate-controlled afib; 09/02/20 S/P exploratory laparotomy, extensive LOA, SBR x1    PMH significant for atrial fibrillation on Eliquis, CAD, history of CVA, hypertension, perforated bowel in 2010, colostomy reversal in 2011, and SBO in 2016 that resolved with conservative management, now  Clinical Impression   Pt admitted secondary to problem above with deficits below. PTA patient was completely independent with mobility and ADLs. Pt currently requires min assist for mobility; limited due to pain. Anticipate quicker progress and that patient will benefit from PT to address problems listed below.Will continue to follow acutely to maximize functional mobility independence and safety.       Follow Up Recommendations No PT follow up    Equipment Recommendations  None recommended by PT    Recommendations for Other Services       Precautions / Restrictions Precautions Precautions: Fall;Other (comment) Precaution Comments: NG tube; abd VAC dressing      Mobility  Bed Mobility Overal bed mobility: Needs Assistance Bed Mobility: Rolling;Sidelying to Sit Rolling: Min assist Sidelying to sit: Min assist       General bed mobility comments: assist due to pain    Transfers Overall transfer level: Needs assistance Equipment used: None Transfers: Stand Pivot Transfers   Stand pivot transfers: Min guard       General transfer comment: pt steady on his feet  Ambulation/Gait             General Gait Details: unable to assess due to RN did not want him unhooked from wall suction to NGT (POD#1 for SBO)  Stairs            Wheelchair Mobility    Modified Rankin (Stroke Patients Only)       Balance Overall balance assessment: No  apparent balance deficits (not formally assessed)                                           Pertinent Vitals/Pain Pain Assessment: 0-10 Pain Score: 6  Pain Location: abd Pain Descriptors / Indicators: Guarding;Operative site guarding Pain Intervention(s): Limited activity within patient's tolerance;Monitored during session;Repositioned    Home Living Family/patient expects to be discharged to:: Private residence Living Arrangements: Spouse/significant other;Children Available Help at Discharge: Family;Available PRN/intermittently Type of Home: House Home Access: Stairs to enter Entrance Stairs-Rails: None Entrance Stairs-Number of Steps: 1 Home Layout: Two level;Laundry or work area in basement;Able to live on main level with [employer]: None      Prior Function Level of Independence: Independent         Comments: Drives, cooking, cleaning.  No falls.     Hand Dominance   Dominant Hand: Right    Extremity/Trunk Assessment   Upper Extremity Assessment Upper Extremity Assessment: Overall WFL for tasks assessed    Lower Extremity Assessment Lower Extremity Assessment: Overall WFL for tasks assessed    Cervical / Trunk Assessment Cervical / Trunk Assessment: Other exceptions Cervical / Trunk Exceptions: wound VAC to abd  Communication   Communication: No difficulties  Cognition Arousal/Alertness: Awake/alert Behavior During Therapy: WFL for tasks assessed/performed Overall Cognitive Status: Within Functional Limits for tasks assessed  General Comments      Exercises     Assessment/Plan    PT Assessment Patient needs continued PT services  PT Problem List Decreased mobility;Decreased knowledge of use of DME;Cardiopulmonary status limiting activity;Pain       PT Treatment Interventions DME instruction;Gait training;Functional mobility training;Therapeutic  activities;Therapeutic exercise;Patient/family education    PT Goals (Current goals can be found in the Care Plan section)  Acute Rehab PT Goals Patient Stated Goal: be able to go home without need of DME PT Goal Formulation: With patient Time For Goal Achievement: 09/17/20 Potential to Achieve Goals: Good    Frequency Min 3X/week   Barriers to discharge        Co-evaluation               AM-PAC PT "6 Clicks" Mobility  Outcome Measure Help needed turning from your back to your side while in a flat bed without using bedrails?: A Little Help needed moving from lying on your back to sitting on the side of a flat bed without using bedrails?: A Little Help needed moving to and from a bed to a chair (including a wheelchair)?: A Little Help needed standing up from a chair using your arms (e.g., wheelchair or bedside chair)?: A Little Help needed to walk in hospital room?: A Little Help needed climbing 3-5 steps with a railing? : A Little 6 Click Score: 18    End of Session Equipment Utilized During Treatment: Oxygen Activity Tolerance: Patient tolerated treatment well Patient left: in chair;with call bell/phone within reach;with chair alarm set Nurse Communication: Mobility status PT Visit Diagnosis: Difficulty in walking, not elsewhere classified (R26.2)    Time: 7793-9030 PT Time Calculation (min) (ACUTE ONLY): 28 min   Charges:   PT Evaluation $PT Eval Low Complexity: 1 Low PT Treatments $Therapeutic Activity: 8-22 mins         Arby Barrette, PT Pager [phone]   Rexanne Mano 09/03/2020, 12:09 PM

## 2020-09-03 NOTE — Progress Notes (Signed)
PHARMACY - TOTAL PARENTERAL NUTRITION CONSULT NOTE  Indication: SBO  Patient Measurements: Height: 6' (182.9 cm) Weight: 96.5 kg (212 lb 11.9 oz) IBW/kg (Calculated) : 77.6 TPN AdjBW (KG): 82.8 Body mass index is 28.85 kg/m. Usual Weight: 97-100 kg  Assessment:  79 YOM presented on 7/15 with acute abdominal pain.  PMH significant for GERD, bowel perforation in 2010, colostomy reversal in 2011 and SBO in 2016.  CT showed possible closed loop.  Initially treated with conservative management, but abdominal xray shows progressive SB distention consistent with SBO.  Pharmacy consulted to TPN management given likely surgery.  Patient denies recent weight loss and reports eating 2 meals a day.  He usually eats fruits for breakfast, fast food for lunch ([employer], McDonald[employer], etc) and 1-2 meals per week for supper, which is usually a sandwich.    Glucose / Insulin: no hx DM. CBGs 115 - 185. Elevated with initiation of TPN. Utilized 3 units/24 hrs. - A1c 5.9 in June 2022 Electrolytes: Na 144. K 4.9 - increase from 3.8. Cl 111. Corr Ca 9.3. Phos 2.4 - decrease from 4.2. Mg 1.9.  Renal: SCr 0.84. BUN 25.  Hepatic: LFTs WNL. TG 255. Albumin 2.5. Prealbumin 14.5 Intake / Output; MIVF: UOP 0.5 ml/kg/hr, NG output 550 ml. NS @100  ml/hr per surgery. Neg 2.3 L.  GI Imaging: none since TPN GI Surgeries / Procedures:   - 7/20 ex-lap: SBO with closed loop obstruction in LUQ, small bowel resection (~40 cm), lysis of adhesions  Central access: 09/02/20 TPN start date: 09/02/20  Nutritional Goals (RD recommendation 7/21): 2200-2400 kCal, 125-155g AA per day, fluid >/= 2.2 L  Current Nutrition:  NPO TPN - goal rate 123ml/hr to prove 139g AA and 2237 kcal - Using 20% of total kcal instead of 30% due to elevated TG  Plan:  Increase TPN to 65 ml/hr - this will provide 90g AA and 1453 kcal.  Electrolytes in TPN: Increase phos to 18 mmol/L, Reduce K to 40 mEq/L. Continue Na 68mEq/L, Ca 65mEq/L, Mg 3mEq/L.  Continue max acetate.  Sodium phos 20 mmol x1  Mg 1g x1  Discussed with surgery and will change NS MIVF to LR and reduce rate to 71ml/hr when new TPN starts at 1800 Add standard MVI and trace elements to TPN Increase SSI to moderate SSI q4h Standard TPN labs Monday/Thursday, repeat electrolytes tomorrow Thiamine 100mg  x7 days per RD recommendation (ends 7/27) Recheck TG on Monday to consider increasing lipids to 30% total kcal   Cristela Felt, PharmD, BCPS Clinical Pharmacist  09/03/2020, 7:02 AM

## 2020-09-03 NOTE — Progress Notes (Signed)
Progress Note  1 Day Post-Op  Subjective: Patient reports pain with coughing or deep inspiration but is fairly comfortable otherwise. He is not passing any flatus or BM.   Objective: Vital signs in last 24 hours: Temp:  [97 F (36.1 C)-98 F (36.7 C)] 98 F (36.7 C) (07/21 0744) Pulse Rate:  [76-108] 92 (07/21 0800) Resp:  [17-27] 27 (07/21 0800) BP: (135-174)/(82-114) 152/89 (07/21 0800) SpO2:  [92 %-99 %] 95 % (07/21 0800) Last BM Date: 08/28/20  Intake/Output from previous day: 07/20 0701 - 07/21 0700 In: 4256.7 [I.V.:3783.9; IV Piggyback:472.8] Out: 2202 [Urine:1125; Emesis/NG output:550; Blood:40] Intake/Output this shift: Total I/O In: 163.8 [I.V.:140.1; IV Piggyback:23.7] Out: 0   PE: General: pleasant, WD, elderly malewho is laying in bed in NAD Heart: A. Fib. Palpable radial and pedal pulses bilaterally Lungs: CTAB, no wheezes, rhonchi, or rales noted.  Respiratory effort nonlabored Abd: soft, appropriately ttp, ND, BS hypoactive, VAC to midline with ss fluid, NGT with bilious drainage GU: foley present with clear yellow urine  Psych: A&Ox3 with an appropriate affect.    Lab Results:  Recent Labs    09/02/20 0508 09/03/20 0500  WBC 7.9 8.9  HGB 16.0 14.3  HCT 48.2 43.3  PLT 191 131*   BMET Recent Labs    09/02/20 0508 09/03/20 0641  NA 143 144  K 3.8 4.9  CL 105 111  CO2 17* 28  GLUCOSE 90 162*  BUN 34* 25*  CREATININE 1.09 0.84  CALCIUM 9.4 8.1*   PT/INR No results for input(s): LABPROT, INR in the last 72 hours. CMP     Component Value Date/Time   NA 144 09/03/2020 0641   NA 141 12/03/2019 1509   K 4.9 09/03/2020 0641   CL 111 09/03/2020 0641   CO2 28 09/03/2020 0641   GLUCOSE 162 (H) 09/03/2020 0641   GLUCOSE 94 01/10/2006 1108   BUN 25 (H) 09/03/2020 0641   BUN 10 12/03/2019 1509   CREATININE 0.84 09/03/2020 0641   CREATININE 0.84 09/30/2019 1349   CALCIUM 8.1 (L) 09/03/2020 0641   PROT 4.8 (L) 09/03/2020 0641   PROT 6.5  12/03/2019 1509   ALBUMIN 2.5 (L) 09/03/2020 0641   ALBUMIN 4.2 12/03/2019 1509   AST 32 09/03/2020 0641   ALT 24 09/03/2020 0641   ALKPHOS 30 (L) 09/03/2020 0641   BILITOT 1.0 09/03/2020 0641   BILITOT 0.9 12/03/2019 1509   GFRNONAA >60 09/03/2020 0641   GFRAA 97 12/03/2019 1509   Lipase     Component Value Date/Time   LIPASE 15 08/28/2020 1931       Studies/Results: DG CHEST PORT 1 VIEW  Result Date: 09/02/2020 CLINICAL DATA:  PICC placement. EXAM: PORTABLE CHEST 1 VIEW COMPARISON:  Earlier today. FINDINGS: Right upper extremity PICC is been retracted, tip now in the mid SVC. No pneumothorax. Persistent low lung volumes with bibasilar atelectasis and vascular crowding. Enteric tube remains in place. Stable heart size and mediastinal contours. IMPRESSION: 1. Tip of the right upper extremity PICC in the mid SVC. No pneumothorax. 2. Persistent low lung volumes with bibasilar atelectasis and vascular crowding. Electronically Signed   By: Keith Rake M.D.   On: 09/02/2020 23:02   DG CHEST PORT 1 VIEW  Result Date: 09/02/2020 CLINICAL DATA:  PICC line placement EXAM: PORTABLE CHEST 1 VIEW COMPARISON:  04/22/2019 FINDINGS: Lung volumes are small and there is resultant vascular crowding at the hila. Mild bibasilar atelectasis. No pneumothorax or pleural effusion. Right upper extremity PICC  line tip is seen within the mid right atrium, roughly 4 cm distal to the superior cavoatrial junction. Nasogastric tube extends into the left upper quadrant of the abdomen. Cardiac size is within normal limits when accounting for poor pulmonary insufflation. No acute bone abnormality. IMPRESSION: Right upper extremity PICC line tip within the right atrium 4 cm distal to the superior cavoatrial junction. Pulmonary hypoinflation with bibasilar atelectasis. Electronically Signed   By: Fidela Salisbury MD   On: 09/02/2020 21:46   DG Abd Portable 1V  Result Date: 09/02/2020 CLINICAL DATA:  Small-bowel  obstruction. EXAM: PORTABLE ABDOMEN - 1 VIEW COMPARISON:  09/01/2020. FINDINGS: Upper abdomen not imaged. Continued progression of prominent small bowel distention with a paucity of colonic gas. Findings again consistent with persistent small-bowel obstruction. No free air identified. Degenerative changes lumbar spine and both hips. IMPRESSION: Continued progression of prominent small bowel distention. Findings consistent with persistent small-bowel obstruction. Electronically Signed   By: Marcello Moores  Register   On: 09/02/2020 08:50   [place] EKG SITE RITE  Result Date: 09/02/2020 If Site Rite image not attached, placement could not be confirmed due to current cardiac rhythm.   Anti-infectives: Anti-infectives (From admission, onward)    Start     Dose/Rate Route Frequency Ordered Stop   09/02/20 1845  piperacillin-tazobactam (ZOSYN) IVPB 3.375 g        3.375 g 12.5 mL/hr over 240 Minutes Intravenous Every 8 hours 09/02/20 1819 09/07/20 1359   09/02/20 1215  ceFAZolin (ANCEF) IVPB 2g/100 mL premix        2 g 200 mL/hr over 30 Minutes Intravenous  Once 09/02/20 1115 09/02/20 1320   08/29/20 0600  cefTRIAXone (ROCEPHIN) 1 g in sodium chloride 0.9 % 100 mL IVPB        1 g 200 mL/hr over 30 Minutes Intravenous Every 24 hours 08/29/20 0204 08/31/20 0730   08/28/20 2300  piperacillin-tazobactam (ZOSYN) IVPB 3.375 g        3.375 g 100 mL/hr over 30 Minutes Intravenous  Once 08/28/20 2245 08/28/20 2342        Assessment/Plan Closed loop bowel obstruction  POD1 S/P exploratory laparotomy, extensive LOA, SBR x1 09/02/20 Dr. Georgette Dover - patient with expected post-op ileus at this time - continue NGT on LIWS, continue TPN - work on pain control and start to mobilize as tolerated, PT/OT ordered - VAC m/w/f - DC foley  - ok to restart heparin - NO BOLUS  - continue IV abx 5 days post-op for spillage of bowel contents     FEN - NPO, NGT to LIWS, IVF @ 100 cc/h, TPN VTE - SCDs, heparin gtt to restart today   ID - Zosyn for 5 days post-op   A. Fib on Eliquis - on hold  History of CVA January 2022 UTI Hypertension Hyperlipidemia  LOS: 5 days    Norm Parcel, [facility] 09/03/2020, 9:15 AM Please see Amion for pager number during day hours 7:00am-4:30pm

## 2020-09-03 NOTE — Progress Notes (Signed)
[place] for apixaban>> heparin Indication: atrial fibrillation and bridging while NPO for SBO  Allergies  Allergen Reactions   Other Other (See Comments)    Some form of numbing medication used by dentist, almost died   Simvastatin Other (See Comments)    Muscle aches     Patient Measurements: Height: 6' (182.9 cm) Weight: 96.5 kg (212 lb 11.9 oz) IBW/kg (Calculated) : 77.6 Heparin Dosing Weight: 97.4 kg   Vital Signs: Temp: 98 F (36.7 C) (07/21 1919) Temp Source: Oral (07/21 1919) BP: 146/90 (07/21 1900) Pulse Rate: 91 (07/21 1900)  Labs: Recent Labs    09/01/20 0358 09/02/20 0508 09/03/20 0500 09/03/20 0641 09/03/20 1900 09/03/20 2043  HGB 16.8 16.0 14.3  --   --   --   HCT 51.7 48.2 43.3  --   --   --   PLT 202 191 131*  --   --   --   HEPARINUNFRC 0.42 0.49 <0.10*  --  <0.10* 0.17*  CREATININE 0.99 1.09  --  0.84  --   --      Estimated Creatinine Clearance: 84.5 mL/min (by C-G formula based on SCr of 0.84 mg/dL).   Assessment: 51 yof with known hx of Afib presented with ab pain - now found to have SBO and is NPO, requiring parenteral anticoagulation for atrial fibrillation (CHADSVASC 6). Last dose apixaban on 7/15@1200 . Patient s/p ex-lap yesteday with SBO resection. Heparin held post operative per surgery. Pharmacy now consulted to resume heparin with no bolus.   Initial heparin level this evening resulted as undetectable at <0.1, re-draw was up to 0.17. Noted previously therapeutic at 1350 units/hr - will increase but not aggressively given recent surgery and known recent therapeutic rates. No bleeding or issues noted per discussion per RN.   Goal of Therapy:  Heparin level 0.3-0.7 units/ml Monitor platelets by anticoagulation protocol: Yes   Plan:  - Increase Heparin to 1400 units/hr (14 ml/hr) - Will continue to monitor for any signs/symptoms of bleeding and will follow up with heparin level in 8 hours    Thank you for allowing pharmacy to be a part of this patient's care.  Alycia Rossetti, PharmD, BCPS Clinical Pharmacist Clinical phone for 09/03/2020: [phone] 09/03/2020 10:05 PM   **Pharmacist phone directory can now be found on [email or web address] ([email or web address]).  Listed under [place].

## 2020-09-03 NOTE — Progress Notes (Signed)
PROGRESS NOTE    William Norman  QQP:[id number] DOB: 1940/05/06 DOA: 08/28/2020 PCP: Vivi Barrack, MD   Brief Narrative:  William Norman is a 80 y.o. male with medical history significant for atrial fibrillation on Eliquis, CAD, history of CVA, hypertension, perforated bowel in 2010, colostomy reversal in 2011, and SBO in 2016 that resolved with conservative management, now presenting with abdominal pain. He had been in his usual state of health and normal BM the am of 08/28/20 before developing acute-onset of abdominal pain at approximately 2 pm. Pain was initially "6/10" and aching in character but he went on to develop fleeting, sharp, "10/10" pains every 30 seconds or so. He reports associated nausea without vomiting and abdominal distension. He denies fevers, diarrhea, chest pain, cough, or SOB. Last dose of Eliquis was the morning of 08/28/20.  Assessment & Plan:   Principal Problem:   SBO (small bowel obstruction) (HCC) Active Problems:   Essential hypertension   Personal history of DVT (deep vein thrombosis)   Persistent atrial fibrillation (HCC)   CAD (coronary artery disease)   History of ischemic stroke   Recurrent UTI   SBO, status post ex lap/resection(40cm) - Patient being followed by general surgery, appreciate insight recommendations -Continue TPN per discussion with pharmacy and surgery -Tolerated ex lap with resection on the 20th quite well, NG tube ongoing, diet to be advanced slowly per surgical recommendations - Abdominal history complicated by bowel perforation in 2010, colostomy reversal in 2011, and SBO in 2016 that resolved with conservative management   UTI, POA, does not meet sepsis criteria, resolved -Abnormal UA questionable symptoms of increased frequency -3 days of ceftriaxone completed  Atrial fibrillation, rate controlled - CHADS-VASc 6  - Last dose of Eliquis was am of 08/28/20 -we will continue to hold postoperatively - Continue IV  heparin 24 hours after surgery, follow closely for bleeding  History of CAD  - No anginal complaints  - Resume statin and beta-blocker once his condition allows     History of CVA  - Resume statin and Eliquis once his condition allows     Hypertension  -Currently normotensive    DVT prophylaxis: Heparin restarted 09/03/2020 postoperatively Code Status: Full Family Communication: None present in  Status is: Patient  Dispo: The patient is from: Home              Anticipated d/c is to: Home              Anticipated d/c date is: >72 hours pending resolution of obstruction              Patient currently not medically stable for discharge  Consultants:  General surgery  Procedures:  None  Antimicrobials:  Ceftriaxone x3 days completed  Subjective: No acute issues or events overnight, tolerated procedure well, abdominal pain currently well controlled denies nausea vomiting diarrhea headache fevers chills.  Objective: Vitals:   09/03/20 0337 09/03/20 0400 09/03/20 0500 09/03/20 0600  BP:  (!) 162/93 (!) 152/93 (!) 154/98  Pulse:  89 90 91  Resp:  (!) 23 (!) 23 (!) 24  Temp: (!) 97.4 F (36.3 C)     TempSrc: Axillary     SpO2:  96% 94% 94%  Weight:      Height:        Intake/Output Summary (Last 24 hours) at 09/03/2020 0729 Last data filed at 09/03/2020 0600 Gross per 24 hour  Intake 4064.76 ml  Output 1715 ml  Net 2349.76 ml  Filed Weights   08/29/20 0100 08/30/20 0501 09/01/20 0457  Weight: 97.1 kg 99.1 kg 96.5 kg    Examination:  General:  Pleasantly resting in bed, No acute distress. HEENT:  Normocephalic atraumatic.  Sclerae nonicteric, noninjected.  Extraocular movements intact bilaterally. NG to suction -dark green/black fluid in container Neck:  Without mass or deformity.  Trachea is midline. Lungs:  Clear to auscultate bilaterally without rhonchi, wheeze, or rales. Heart:  Regular rate and rhythm.  Without murmurs, rubs, or gallops. Abdomen: Bandage  clean dry intact Extremities: Without cyanosis, clubbing, edema, or obvious deformity. Vascular:  Dorsalis pedis and posterior tibial pulses palpable bilaterally. Skin:  Warm and dry, no erythema, no ulcerations.  Data Reviewed: I have personally reviewed following labs and imaging studies  CBC: Recent Labs  Lab 08/30/20 0031 08/31/20 0521 09/01/20 0358 09/02/20 0508 09/03/20 0500  WBC 12.2* 11.0* 10.7* 7.9 8.9  NEUTROABS  --   --   --   --  6.9  HGB 16.5 15.9 16.8 16.0 14.3  HCT 50.2 50.2 51.7 48.2 43.3  MCV 90.1 93.7 90.9 89.8 93.1  PLT 205 171 202 191 131*    Basic Metabolic Panel: Recent Labs  Lab 08/29/20 0241 08/30/20 0031 08/31/20 0521 09/01/20 0358 09/02/20 0508 09/02/20 1938  NA 138 136 138 140 143  --   K 4.3 3.8 3.9 3.3* 3.8  --   CL 106 100 103 101 105  --   CO2 25 25 23 29  17*  --   GLUCOSE 122* 117* 89 88 90  --   BUN 16 14 19 23  34*  --   CREATININE 0.85 0.98 0.92 0.99 1.09  --   CALCIUM 9.3 9.4 9.3 9.6 9.4  --   MG  --   --   --   --   --  1.9  PHOS  --   --   --   --   --  4.2    GFR: Estimated Creatinine Clearance: 65.1 mL/min (by C-G formula based on SCr of 1.09 mg/dL). Liver Function Tests: Recent Labs  Lab 08/28/20 1931  AST 24  ALT 18  ALKPHOS 52  BILITOT 1.2  PROT 6.8  ALBUMIN 4.2    Recent Labs  Lab 08/28/20 1931  LIPASE 15    No results for input(s): AMMONIA in the last 168 hours. Coagulation Profile: No results for input(s): INR, PROTIME in the last 168 hours. Cardiac Enzymes: No results for input(s): CKTOTAL, CKMB, CKMBINDEX, TROPONINI in the last 168 hours. BNP (last 3 results) No results for input(s): PROBNP in the last 8760 hours. HbA1C: No results for input(s): HGBA1C in the last 72 hours. CBG: Recent Labs  Lab 09/02/20 1218 09/02/20 1837 09/02/20 2307 09/03/20 0503  GLUCAP 115* 147* 139* 185*   Lipid Profile: Recent Labs    09/03/20 0500  TRIG 255*   Thyroid Function Tests: No results for  input(s): TSH, T4TOTAL, FREET4, T3FREE, THYROIDAB in the last 72 hours. Anemia Panel: No results for input(s): VITAMINB12, FOLATE, FERRITIN, TIBC, IRON, RETICCTPCT in the last 72 hours. Sepsis Labs: Recent Labs  Lab 08/28/20 2322 08/29/20 0215  LATICACIDVEN 1.9 1.7     Recent Results (from the past 240 hour(s))  Culture, blood (Routine X 2) w Reflex to ID Panel     Status: None (Preliminary result)   Collection Time: 08/28/20 10:50 PM   Specimen: BLOOD  Result Value Ref Range Status   Specimen Description BLOOD LEFT ANTECUBITAL  Final  Special Requests   Final    BOTTLES DRAWN AEROBIC AND ANAEROBIC Blood Culture adequate volume   Culture   Final    NO GROWTH 4 DAYS Performed at [facility], [place] [address], [place], [place] [address]    Report Status PENDING  Incomplete  Culture, blood (Routine X 2) w Reflex to ID Panel     Status: None (Preliminary result)   Collection Time: 08/28/20 11:00 PM   Specimen: BLOOD  Result Value Ref Range Status   Specimen Description BLOOD FEMORAL ARTERY RIGHT  Final   Special Requests   Final    BOTTLES DRAWN AEROBIC AND ANAEROBIC Blood Culture adequate volume   Culture   Final    NO GROWTH 4 DAYS Performed at [facility], [place] [address], [place], [place] [address]    Report Status PENDING  Incomplete  Urine Culture     Status: Abnormal   Collection Time: 08/28/20 11:22 PM   Specimen: Urine, Random  Result Value Ref Range Status   Specimen Description   Final    URINE, RANDOM Performed at [facility], [address], [place], [place] [address]    Special Requests   Final    NONE Performed at [facility], [address], [place], [place] [address]    Culture MULTIPLE SPECIES PRESENT, SUGGEST RECOLLECTION (A)  Final   Report Status 08/30/2020 FINAL  Final  Resp Panel by RT-PCR (Flu A&B, Covid)     Status: None   Collection Time: 08/28/20 11:22 PM   Specimen: Nasopharyngeal(NP)  swabs in vial transport medium  Result Value Ref Range Status   SARS Coronavirus 2 by RT PCR NEGATIVE NEGATIVE Final    Comment: (NOTE) SARS-CoV-2 target nucleic acids are NOT DETECTED.  The SARS-CoV-2 RNA is generally detectable in upper respiratory specimens during the acute phase of infection. The lowest concentration of SARS-CoV-2 viral copies this assay can detect is 138 copies/mL. A negative result does not preclude SARS-Cov-2 infection and should not be used as the sole basis for treatment or other patient management decisions. A negative result may occur with  improper specimen collection/handling, submission of specimen other than nasopharyngeal swab, presence of viral mutation(s) within the areas targeted by this assay, and inadequate number of viral copies(<138 copies/mL). A negative result must be combined with clinical observations, patient history, and epidemiological information. The expected result is Negative.  Fact Sheet for Patients:  EntrepreneurPulse.com.au  Fact Sheet for Healthcare Providers:  IncredibleEmployment.be  This test is no t yet approved or cleared by the [place] FDA and  has been authorized for detection and/or diagnosis of SARS-CoV-2 by FDA under an Emergency Use Authorization (EUA). This EUA will remain  in effect (meaning this test can be used) for the duration of the COVID-19 declaration under Section 564(b)(1) of the Act, 21 U.S.C.section 360bbb-3(b)(1), unless the authorization is terminated  or revoked sooner.       Influenza A by PCR NEGATIVE NEGATIVE Final   Influenza B by PCR NEGATIVE NEGATIVE Final    Comment: (NOTE) The Xpert Xpress SARS-CoV-2/FLU/RSV plus assay is intended as an aid in the diagnosis of influenza from Nasopharyngeal swab specimens and should not be used as a sole basis for treatment. Nasal washings and aspirates are unacceptable for Xpert Xpress  SARS-CoV-2/FLU/RSV testing.  Fact Sheet for Patients: EntrepreneurPulse.com.au  Fact Sheet for Healthcare Providers: IncredibleEmployment.be  This test is not yet approved or cleared by the [place] FDA and has  been authorized for detection and/or diagnosis of SARS-CoV-2 by FDA under an Emergency Use Authorization (EUA). This EUA will remain in effect (meaning this test can be used) for the duration of the COVID-19 declaration under Section 564(b)(1) of the Act, 21 U.S.C. section 360bbb-3(b)(1), unless the authorization is terminated or revoked.  Performed at [facility], [address] [place], [place] [address]   Culture, blood (routine x 2)     Status: None (Preliminary result)   Collection Time: 08/29/20  2:15 AM   Specimen: BLOOD  Result Value Ref Range Status   Specimen Description BLOOD RIGHT ANTECUBITAL  Final   Special Requests   Final    BOTTLES DRAWN AEROBIC AND ANAEROBIC Blood Culture adequate volume   Culture   Final    NO GROWTH 4 DAYS Performed at [facility], [place] [address] [place], [place] [address]    Report Status PENDING  Incomplete  Culture, blood (routine x 2)     Status: None (Preliminary result)   Collection Time: 08/29/20  2:22 AM   Specimen: BLOOD LEFT HAND  Result Value Ref Range Status   Specimen Description BLOOD LEFT HAND  Final   Special Requests   Final    BOTTLES DRAWN AEROBIC AND ANAEROBIC Blood Culture adequate volume   Culture   Final    NO GROWTH 4 DAYS Performed at [facility], [place] [address], [place], [place] [address]    Report Status PENDING  Incomplete  Surgical pcr screen     Status: None   Collection Time: 09/01/20  8:09 AM   Specimen: Nasal Mucosa; Nasal Swab  Result Value Ref Range Status   MRSA, PCR NEGATIVE NEGATIVE Final   Staphylococcus aureus NEGATIVE NEGATIVE Final    Comment: (NOTE) The Xpert SA Assay (FDA approved for NASAL specimens in  patients 42 years of age and older), is one component of a comprehensive surveillance program. It is not intended to diagnose infection nor to guide or monitor treatment. Performed at [facility], [place] [address], [place], [place] [address]    Radiology Studies: DG CHEST PORT 1 VIEW  Result Date: 09/02/2020 CLINICAL DATA:  PICC placement. EXAM: PORTABLE CHEST 1 VIEW COMPARISON:  Earlier today. FINDINGS: Right upper extremity PICC is been retracted, tip now in the mid SVC. No pneumothorax. Persistent low lung volumes with bibasilar atelectasis and vascular crowding. Enteric tube remains in place. Stable heart size and mediastinal contours. IMPRESSION: 1. Tip of the right upper extremity PICC in the mid SVC. No pneumothorax. 2. Persistent low lung volumes with bibasilar atelectasis and vascular crowding. Electronically Signed   By: Keith Rake M.D.   On: 09/02/2020 23:02   DG CHEST PORT 1 VIEW  Result Date: 09/02/2020 CLINICAL DATA:  PICC line placement EXAM: PORTABLE CHEST 1 VIEW COMPARISON:  04/22/2019 FINDINGS: Lung volumes are small and there is resultant vascular crowding at the hila. Mild bibasilar atelectasis. No pneumothorax or pleural effusion. Right upper extremity PICC line tip is seen within the mid right atrium, roughly 4 cm distal to the superior cavoatrial junction. Nasogastric tube extends into the left upper quadrant of the abdomen. Cardiac size is within normal limits when accounting for poor pulmonary insufflation. No acute bone abnormality. IMPRESSION: Right upper extremity PICC line tip within the right atrium 4 cm distal to the superior cavoatrial junction. Pulmonary hypoinflation with bibasilar atelectasis. Electronically Signed   By: Fidela Salisbury MD   On: 09/02/2020 21:46   DG  Abd Portable 1V  Result Date: 09/02/2020 CLINICAL DATA:  Small-bowel obstruction. EXAM: PORTABLE ABDOMEN - 1 VIEW COMPARISON:  09/01/2020. FINDINGS: Upper abdomen not imaged. Continued  progression of prominent small bowel distention with a paucity of colonic gas. Findings again consistent with persistent small-bowel obstruction. No free air identified. Degenerative changes lumbar spine and both hips. IMPRESSION: Continued progression of prominent small bowel distention. Findings consistent with persistent small-bowel obstruction. Electronically Signed   By: Marcello Moores  Register   On: 09/02/2020 08:50   [place] EKG SITE RITE  Result Date: 09/02/2020 If Site Rite image not attached, placement could not be confirmed due to current cardiac rhythm.    Scheduled Meds:  Chlorhexidine Gluconate Cloth  6 each Topical Daily   insulin aspart  0-9 Units Subcutaneous Q6H   sodium chloride flush  10-40 mL Intracatheter Q12H   Continuous Infusions:  sodium chloride 10 mL/hr at 09/02/20 0645   0.9 % NaCl with KCl 20 mEq / L 100 mL/hr at 09/03/20 0600   acetaminophen Stopped (09/03/20 0213)   methocarbamol (ROBAXIN) IV 500 mg (09/03/20 0622)   piperacillin-tazobactam (ZOSYN)  IV 12.5 mL/hr at 09/03/20 0600   TPN ADULT (ION) 40 mL/hr at 09/03/20 0600     LOS: 5 days   Time spent: 61min  Cian Costanzo C Javia Dillow, DO [facility]  If 7PM-7AM, please contact night-coverage [email or web address]  09/03/2020, 7:29 AM

## 2020-09-03 NOTE — Progress Notes (Signed)
I attempted to call patient's wife, Rimas Gilham, at both home and mobile numbers listed twice today to provide surgical update. Was unable to reach both times. I have asked nursing to notify me if patient's wife comes to visit him today so that I can update her at bedside.   Norm Parcel, [facility] 09/03/2020, 3:20 PM Please see Amion for pager number during day hours 7:00am-4:30pm

## 2020-09-04 DIAGNOSIS — K56609 Unspecified intestinal obstruction, unspecified as to partial versus complete obstruction: Secondary | ICD-10-CM | POA: Diagnosis not present

## 2020-09-04 LAB — CBC
HCT: 43 % (ref 39.0–52.0)
Hemoglobin: 14.2 g/dL (ref 13.0–17.0)
MCH: 30.6 pg (ref 26.0–34.0)
MCHC: 33 g/dL (ref 30.0–36.0)
MCV: 92.7 fL (ref 80.0–100.0)
Platelets: 145 10*3/uL — ABNORMAL LOW (ref 150–400)
RBC: 4.64 MIL/uL (ref 4.22–5.81)
RDW: 14.4 % (ref 11.5–15.5)
WBC: 12.5 10*3/uL — ABNORMAL HIGH (ref 4.0–10.5)
nRBC: 0 % (ref 0.0–0.2)

## 2020-09-04 LAB — BASIC METABOLIC PANEL
Anion gap: 6 (ref 5–15)
BUN: 20 mg/dL (ref 8–23)
CO2: 31 mmol/L (ref 22–32)
Calcium: 8.7 mg/dL — ABNORMAL LOW (ref 8.9–10.3)
Chloride: 105 mmol/L (ref 98–111)
Creatinine, Ser: 0.76 mg/dL (ref 0.61–1.24)
GFR, Estimated: >60 mL/min (ref >60)
Glucose, Bld: 125 mg/dL — ABNORMAL HIGH (ref 70–99)
Potassium: 3.1 mmol/L — ABNORMAL LOW (ref 3.5–5.1)
Sodium: 142 mmol/L (ref 135–145)

## 2020-09-04 LAB — SURGICAL PATHOLOGY

## 2020-09-04 LAB — HEPARIN LEVEL (UNFRACTIONATED)
Heparin Unfractionated: 0.29 IU/mL — ABNORMAL LOW (ref 0.30–0.70)
Heparin Unfractionated: 0.2 IU/mL — ABNORMAL LOW (ref 0.30–0.70)
Heparin Unfractionated: 0.1 IU/mL — ABNORMAL LOW (ref 0.30–0.70)

## 2020-09-04 LAB — GLUCOSE, CAPILLARY
Glucose-Capillary: 116 mg/dL — ABNORMAL HIGH (ref 70–99)
Glucose-Capillary: 116 mg/dL — ABNORMAL HIGH (ref 70–99)
Glucose-Capillary: 133 mg/dL — ABNORMAL HIGH (ref 70–99)
Glucose-Capillary: 120 mg/dL — ABNORMAL HIGH (ref 70–99)
Glucose-Capillary: 131 mg/dL — ABNORMAL HIGH (ref 70–99)

## 2020-09-04 LAB — MAGNESIUM: Magnesium: 2.1 mg/dL (ref 1.7–2.4)

## 2020-09-04 LAB — PHOSPHORUS: Phosphorus: 2.6 mg/dL (ref 2.5–4.6)

## 2020-09-04 MED ORDER — HYALURONIDASE HUMAN 150 UNIT/ML IJ SOLN
150.0000 [IU] | Freq: Once | INTRAMUSCULAR | Status: AC
  Administered 2020-09-04: 150 [IU] via SUBCUTANEOUS
  Filled 2020-09-04: qty 1

## 2020-09-04 MED ORDER — HYALURONIDASE OVINE 200 UNIT/ML IJ SOLN
200.0000 [IU] | Freq: Once | INTRAMUSCULAR | Status: DC
  Filled 2020-09-04: qty 1

## 2020-09-04 MED ORDER — POTASSIUM CHLORIDE 10 MEQ/100ML IV SOLN
10.0000 meq | INTRAVENOUS | Status: AC
  Administered 2020-09-04 (×4): 10 meq via INTRAVENOUS
  Filled 2020-09-04 (×4): qty 100

## 2020-09-04 MED ORDER — FENTANYL CITRATE (PF) 100 MCG/2ML IJ SOLN
50.0000 ug | Freq: Once | INTRAMUSCULAR | Status: AC
Start: 2020-09-04 — End: 2020-09-04
  Administered 2020-09-04: 50 ug via INTRAVENOUS
  Filled 2020-09-04: qty 2

## 2020-09-04 MED ORDER — TRAVASOL 10 % IV SOLN
INTRAVENOUS | Status: AC
  Filled 2020-09-04: qty 1392

## 2020-09-04 NOTE — Progress Notes (Signed)
William Norman is a 80 y.o. male patient transfer from 24M to 6N06. Awake, alert - oriented  X 4 - no acute distress noted.  VSS - Blood pressure (!) 151/80, pulse 85, temperature 99.5 F (37.5 C), temperature source Oral, resp. rate 17, height 6' (1.829 m), weight 96.5 kg, SpO2 97 %.    IV in place, occlusive dsg intact without redness.  Will cont to eval and treat per MD orders.  Vidal Schwalbe, RN 09/04/2020 11:59 PM

## 2020-09-04 NOTE — Progress Notes (Signed)
Progress Note  2 Days Post-Op  Subjective: Patient reports abdominal pain is stable. Was able to get up with PT yesterday. No bowel function.   Objective: Vital signs in last 24 hours: Temp:  [97.5 F (36.4 C)-98.2 F (36.8 C)] 98.1 F (36.7 C) (07/22 0713) Pulse Rate:  [85-110] 97 (07/22 0600) Resp:  [18-27] 25 (07/22 0600) BP: (121-155)/(67-93) 136/83 (07/22 0600) SpO2:  [93 %-96 %] 93 % (07/22 0600) Last BM Date: 08/28/20  Intake/Output from previous day: 07/21 0701 - 07/22 0700 In: 3387.5 [I.V.:2429.7; IV Piggyback:957.8] Out: 700 [Urine:700] Intake/Output this shift: No intake/output data recorded.  PE: General: pleasant, WD, elderly male who is laying in bed in NAD Heart: RRR, Palpable radial and pedal pulses bilaterally Lungs: CTAB, no wheezes, rhonchi, or rales noted.  Respiratory effort nonlabored Abd: soft, appropriately ttp, ND, BS hypoactive, Midline wound clean, NGT with bilious drainage GU: condom cath present with clear yellow urine  Psych: A&Ox3 with an appropriate affect.   Lab Results:  Recent Labs    09/03/20 0500 09/04/20 0300  WBC 8.9 12.5*  HGB 14.3 14.2  HCT 43.3 43.0  PLT 131* 145*   BMET Recent Labs    09/03/20 0641 09/04/20 0300  NA 144 142  K 4.9 3.1*  CL 111 105  CO2 28 31  GLUCOSE 162* 125*  BUN 25* 20  CREATININE 0.84 0.76  CALCIUM 8.1* 8.7*   PT/INR No results for input(s): LABPROT, INR in the last 72 hours. CMP     Component Value Date/Time   NA 142 09/04/2020 0300   NA 141 12/03/2019 1509   K 3.1 (L) 09/04/2020 0300   CL 105 09/04/2020 0300   CO2 31 09/04/2020 0300   GLUCOSE 125 (H) 09/04/2020 0300   GLUCOSE 94 01/10/2006 1108   BUN 20 09/04/2020 0300   BUN 10 12/03/2019 1509   CREATININE 0.76 09/04/2020 0300   CREATININE 0.84 09/30/2019 1349   CALCIUM 8.7 (L) 09/04/2020 0300   PROT 4.8 (L) 09/03/2020 0641   PROT 6.5 12/03/2019 1509   ALBUMIN 2.5 (L) 09/03/2020 0641   ALBUMIN 4.2 12/03/2019 1509   AST  32 09/03/2020 0641   ALT 24 09/03/2020 0641   ALKPHOS 30 (L) 09/03/2020 0641   BILITOT 1.0 09/03/2020 0641   BILITOT 0.9 12/03/2019 1509   GFRNONAA >60 09/04/2020 0300   GFRAA 97 12/03/2019 1509   Lipase     Component Value Date/Time   LIPASE 15 08/28/2020 1931       Studies/Results: DG CHEST PORT 1 VIEW  Result Date: 09/02/2020 CLINICAL DATA:  PICC placement. EXAM: PORTABLE CHEST 1 VIEW COMPARISON:  Earlier today. FINDINGS: Right upper extremity PICC is been retracted, tip now in the mid SVC. No pneumothorax. Persistent low lung volumes with bibasilar atelectasis and vascular crowding. Enteric tube remains in place. Stable heart size and mediastinal contours. IMPRESSION: 1. Tip of the right upper extremity PICC in the mid SVC. No pneumothorax. 2. Persistent low lung volumes with bibasilar atelectasis and vascular crowding. Electronically Signed   By: Keith Rake M.D.   On: 09/02/2020 23:02   DG CHEST PORT 1 VIEW  Result Date: 09/02/2020 CLINICAL DATA:  PICC line placement EXAM: PORTABLE CHEST 1 VIEW COMPARISON:  04/22/2019 FINDINGS: Lung volumes are small and there is resultant vascular crowding at the hila. Mild bibasilar atelectasis. No pneumothorax or pleural effusion. Right upper extremity PICC line tip is seen within the mid right atrium, roughly 4 cm distal to the superior  cavoatrial junction. Nasogastric tube extends into the left upper quadrant of the abdomen. Cardiac size is within normal limits when accounting for poor pulmonary insufflation. No acute bone abnormality. IMPRESSION: Right upper extremity PICC line tip within the right atrium 4 cm distal to the superior cavoatrial junction. Pulmonary hypoinflation with bibasilar atelectasis. Electronically Signed   By: Fidela Salisbury MD   On: 09/02/2020 21:46    Anti-infectives: Anti-infectives (From admission, onward)    Start     Dose/Rate Route Frequency Ordered Stop   09/02/20 1845  piperacillin-tazobactam (ZOSYN) IVPB  3.375 g        3.375 g 12.5 mL/hr over 240 Minutes Intravenous Every 8 hours 09/02/20 1819 09/07/20 1359   09/02/20 1215  ceFAZolin (ANCEF) IVPB 2g/100 mL premix        2 g 200 mL/hr over 30 Minutes Intravenous  Once 09/02/20 1115 09/02/20 1320   08/29/20 0600  cefTRIAXone (ROCEPHIN) 1 g in sodium chloride 0.9 % 100 mL IVPB        1 g 200 mL/hr over 30 Minutes Intravenous Every 24 hours 08/29/20 0204 08/31/20 0730   08/28/20 2300  piperacillin-tazobactam (ZOSYN) IVPB 3.375 g        3.375 g 100 mL/hr over 30 Minutes Intravenous  Once 08/28/20 2245 08/28/20 2342        Assessment/Plan Closed loop bowel obstruction POD2 S/P exploratory laparotomy, extensive LOA, SBR x1 09/02/20 Dr. Georgette Dover - patient with expected post-op ileus at this time - continue NGT on LIWS, continue TPN - goal K >4.0 and Mg >2.0 to help optimize bowel function  - mobilize as tolerated, PT/OT  - VAC m/w/f - changed at bedside with RN this AM, wound measures 19.5 cm x 3.5 cm x 3 cm - continue IV abx 5 days post-op for spillage of bowel contents Hypokalemia - K 3.1, getting IV replacement    FEN - NPO, NGT to LIWS, IVF @ 50 cc/h, TPN VTE - SCDs, heparin gtt  ID - Zosyn for 5 days post-op   A. Fib on Eliquis - on hold  History of CVA January 2022 UTI - competed course of rocephin  Hypertension Hyperlipidemia  LOS: 6 days    Norm Parcel, [facility] 09/04/2020, 10:29 AM Please see Amion for pager number during day hours 7:00am-4:30pm

## 2020-09-04 NOTE — Progress Notes (Signed)
PROGRESS NOTE    William Norman  [id number] DOB: [birth year] DOA: 08/28/2020 PCP: Vivi Barrack, MD   Brief Narrative:  William Norman is a 80 y.o. male with medical history significant for atrial fibrillation on Eliquis, CAD, history of CVA, hypertension, perforated bowel in 2010, colostomy reversal in 2011, and SBO in 2016 that resolved with conservative management, now presenting with abdominal pain. He had been in his usual state of health and normal BM the am of 08/28/20 before developing acute-onset of abdominal pain at approximately 2 pm. Pain was initially "6/10" and aching in character but he went on to develop fleeting, sharp, "10/10" pains every 30 seconds or so. He reports associated nausea without vomiting and abdominal distension. He denies fevers, diarrhea, chest pain, cough, or SOB. Last dose of Eliquis was the morning of 08/28/20.  Assessment & Plan:   Principal Problem:   SBO (small bowel obstruction) (HCC) Active Problems:   Essential hypertension   Personal history of DVT (deep vein thrombosis)   Persistent atrial fibrillation (HCC)   CAD (coronary artery disease)   History of ischemic stroke   Recurrent UTI   Protein-calorie malnutrition, severe   SBO, status post ex lap/resection(40cm) - Patient being followed by general surgery, appreciate insight recommendations in the postoperative setting - Continue TPN per discussion with pharmacy and surgery - Tolerated ex lap with resection on the 20th quite well, NG tube ongoing, diet to be advanced slowly per surgical recommendations once bowel function returns postoperatively  - Abdominal history complicated by bowel perforation in 2010, colostomy reversal in 2011, and SBO in 2016 that resolved with conservative management   UTI, POA, does not meet sepsis criteria, resolved -Abnormal UA questionable symptoms of increased frequency -3 days of ceftriaxone completed  Atrial fibrillation, rate  controlled - CHADS-VASc 6  - Last dose of Eliquis was am of 08/28/20 -we will continue to hold postoperatively - Continue IV heparin 24 hours after surgery, follow closely for bleeding  History of CAD  - No anginal complaints  - Resume statin and beta-blocker once his condition allows     History of CVA  - Resume statin and Eliquis once his condition allows     Hypertension  -Currently normotensive    DVT prophylaxis: Heparin restarted 09/03/2020 postoperatively Code Status: Full Family Communication: None present in  Status is: Patient  Dispo: The patient is from: Home              Anticipated d/c is to: Home              Anticipated d/c date is: >72 hours pending postoperative improvement              Patient currently not medically stable for discharge  Consultants:  General surgery  Procedures:  None  Antimicrobials:  Ceftriaxone x3 days completed  Subjective: No acute issues or events overnight, tolerated wound VAC change this morning quite well otherwise denies nausea vomiting headache fevers or chills.  Patient still denies any recent flatus or bowel movements.  Objective: Vitals:   09/04/20 0400 09/04/20 0500 09/04/20 0600 09/04/20 0713  BP: 138/82 (!) 142/92 136/83   Pulse: 89 94 97   Resp: 20 (!) 24 (!) 25   Temp:    98.1 F (36.7 C)  TempSrc:    Oral  SpO2: 94% 94% 93%   Weight:      Height:        Intake/Output Summary (Last 24 hours) at 09/04/2020 0725  Last data filed at 09/04/2020 0600 Gross per 24 hour  Intake 3387.5 ml  Output 700 ml  Net 2687.5 ml    Filed Weights   08/29/20 0100 08/30/20 0501 09/01/20 0457  Weight: 97.1 kg 99.1 kg 96.5 kg    Examination:  General:  Pleasantly resting in bed, No acute distress. HEENT:  Normocephalic atraumatic.  Sclerae nonicteric, noninjected.  Extraocular movements intact bilaterally. NG to suction -dark green/black fluid in container Neck:  Without mass or deformity.  Trachea is midline. Lungs:   Clear to auscultate bilaterally without rhonchi, wheeze, or rales. Heart:  Regular rate and rhythm.  Without murmurs, rubs, or gallops. Abdomen: Wound VAC bandage clean dry intact Extremities: Without cyanosis, clubbing, edema, or obvious deformity. Vascular:  Dorsalis pedis and posterior tibial pulses palpable bilaterally. Skin:  Warm and dry, no erythema, no ulcerations.  Data Reviewed: I have personally reviewed following labs and imaging studies  CBC: Recent Labs  Lab 08/31/20 0521 09/01/20 0358 09/02/20 0508 09/03/20 0500 09/04/20 0300  WBC 11.0* 10.7* 7.9 8.9 12.5*  NEUTROABS  --   --   --  6.9  --   HGB 15.9 16.8 16.0 14.3 14.2  HCT 50.2 51.7 48.2 43.3 43.0  MCV 93.7 90.9 89.8 93.1 92.7  PLT 171 202 191 131* 145*    Basic Metabolic Panel: Recent Labs  Lab 08/31/20 0521 09/01/20 0358 09/02/20 0508 09/02/20 1938 09/03/20 0641 09/04/20 0300  NA 138 140 143  --  144 142  K 3.9 3.3* 3.8  --  4.9 3.1*  CL 103 101 105  --  111 105  CO2 23 29 17*  --  28 31  GLUCOSE 89 88 90  --  162* 125*  BUN 19 23 34*  --  25* 20  CREATININE 0.92 0.99 1.09  --  0.84 0.76  CALCIUM 9.3 9.6 9.4  --  8.1* 8.7*  MG  --   --   --  1.9 1.9 2.1  PHOS  --   --   --  4.2 2.4* 2.6    GFR: Estimated Creatinine Clearance: 88.8 mL/min (by C-G formula based on SCr of 0.76 mg/dL). Liver Function Tests: Recent Labs  Lab 08/28/20 1931 09/03/20 0641  AST 24 32  ALT 18 24  ALKPHOS 52 30*  BILITOT 1.2 1.0  PROT 6.8 4.8*  ALBUMIN 4.2 2.5*    Recent Labs  Lab 08/28/20 1931  LIPASE 15    No results for input(s): AMMONIA in the last 168 hours. Coagulation Profile: No results for input(s): INR, PROTIME in the last 168 hours. Cardiac Enzymes: No results for input(s): CKTOTAL, CKMB, CKMBINDEX, TROPONINI in the last 168 hours. BNP (last 3 results) No results for input(s): PROBNP in the last 8760 hours. HbA1C: No results for input(s): HGBA1C in the last 72 hours. CBG: Recent Labs   Lab 09/03/20 1713 09/03/20 1913 09/03/20 2311 09/04/20 0256 09/04/20 0711  GLUCAP 139* 143* 151* 116* 116*    Lipid Profile: Recent Labs    09/03/20 0500  TRIG 255*    Thyroid Function Tests: No results for input(s): TSH, T4TOTAL, FREET4, T3FREE, THYROIDAB in the last 72 hours. Anemia Panel: No results for input(s): VITAMINB12, FOLATE, FERRITIN, TIBC, IRON, RETICCTPCT in the last 72 hours. Sepsis Labs: Recent Labs  Lab 08/28/20 2322 08/29/20 0215  LATICACIDVEN 1.9 1.7     Recent Results (from the past 240 hour(s))  Culture, blood (Routine X 2) w Reflex to ID Panel  Status: None (Preliminary result)   Collection Time: 08/28/20 10:50 PM   Specimen: BLOOD  Result Value Ref Range Status   Specimen Description BLOOD LEFT ANTECUBITAL  Final   Special Requests   Final    BOTTLES DRAWN AEROBIC AND ANAEROBIC Blood Culture adequate volume   Culture   Final    NO GROWTH 4 DAYS Performed at [facility], [place] [address] [place], [place] [address]    Report Status PENDING  Incomplete  Culture, blood (Routine X 2) w Reflex to ID Panel     Status: None (Preliminary result)   Collection Time: 08/28/20 11:00 PM   Specimen: BLOOD  Result Value Ref Range Status   Specimen Description BLOOD FEMORAL ARTERY RIGHT  Final   Special Requests   Final    BOTTLES DRAWN AEROBIC AND ANAEROBIC Blood Culture adequate volume   Culture   Final    NO GROWTH 4 DAYS Performed at [facility], [place] [address], [place], [place] [address]    Report Status PENDING  Incomplete  Urine Culture     Status: Abnormal   Collection Time: 08/28/20 11:22 PM   Specimen: Urine, Random  Result Value Ref Range Status   Specimen Description   Final    URINE, RANDOM Performed at [facility], [address], [place], [place] [address]    Special Requests   Final    NONE Performed at [facility], [address] [place], [place] [address]    Culture  MULTIPLE SPECIES PRESENT, SUGGEST RECOLLECTION (A)  Final   Report Status 08/30/2020 FINAL  Final  Resp Panel by RT-PCR (Flu A&B, Covid)     Status: None   Collection Time: 08/28/20 11:22 PM   Specimen: Nasopharyngeal(NP) swabs in vial transport medium  Result Value Ref Range Status   SARS Coronavirus 2 by RT PCR NEGATIVE NEGATIVE Final    Comment: (NOTE) SARS-CoV-2 target nucleic acids are NOT DETECTED.  The SARS-CoV-2 RNA is generally detectable in upper respiratory specimens during the acute phase of infection. The lowest concentration of SARS-CoV-2 viral copies this assay can detect is 138 copies/mL. A negative result does not preclude SARS-Cov-2 infection and should not be used as the sole basis for treatment or other patient management decisions. A negative result may occur with  improper specimen collection/handling, submission of specimen other than nasopharyngeal swab, presence of viral mutation(s) within the areas targeted by this assay, and inadequate number of viral copies(<138 copies/mL). A negative result must be combined with clinical observations, patient history, and epidemiological information. The expected result is Negative.  Fact Sheet for Patients:  EntrepreneurPulse.com.au  Fact Sheet for Healthcare Providers:  IncredibleEmployment.be  This test is no t yet approved or cleared by the [place] FDA and  has been authorized for detection and/or diagnosis of SARS-CoV-2 by FDA under an Emergency Use Authorization (EUA). This EUA will remain  in effect (meaning this test can be used) for the duration of the COVID-19 declaration under Section 564(b)(1) of the Act, 21 U.S.C.section 360bbb-3(b)(1), unless the authorization is terminated  or revoked sooner.       Influenza A by PCR NEGATIVE NEGATIVE Final   Influenza B by PCR NEGATIVE NEGATIVE Final    Comment: (NOTE) The Xpert Xpress SARS-CoV-2/FLU/RSV plus assay is  intended as an aid in the diagnosis of influenza from Nasopharyngeal swab specimens and should not be used as a sole basis for treatment. Nasal washings and aspirates are unacceptable for Xpert  Xpress SARS-CoV-2/FLU/RSV testing.  Fact Sheet for Patients: EntrepreneurPulse.com.au  Fact Sheet for Healthcare Providers: IncredibleEmployment.be  This test is not yet approved or cleared by the [place] FDA and has been authorized for detection and/or diagnosis of SARS-CoV-2 by FDA under an Emergency Use Authorization (EUA). This EUA will remain in effect (meaning this test can be used) for the duration of the COVID-19 declaration under Section 564(b)(1) of the Act, 21 U.S.C. section 360bbb-3(b)(1), unless the authorization is terminated or revoked.  Performed at [facility], [address] [place], [place] [address]   Culture, blood (routine x 2)     Status: None   Collection Time: 08/29/20  2:15 AM   Specimen: BLOOD  Result Value Ref Range Status   Specimen Description BLOOD RIGHT ANTECUBITAL  Final   Special Requests   Final    BOTTLES DRAWN AEROBIC AND ANAEROBIC Blood Culture adequate volume   Culture   Final    NO GROWTH 5 DAYS Performed at [facility], [address] [place], [place] [address]    Report Status 09/03/2020 FINAL  Final  Culture, blood (routine x 2)     Status: None   Collection Time: 08/29/20  2:22 AM   Specimen: BLOOD LEFT HAND  Result Value Ref Range Status   Specimen Description BLOOD LEFT HAND  Final   Special Requests   Final    BOTTLES DRAWN AEROBIC AND ANAEROBIC Blood Culture adequate volume   Culture   Final    NO GROWTH 5 DAYS Performed at [facility], [place] [address], [place], [place] [address]    Report Status 09/03/2020 FINAL  Final  Surgical pcr screen     Status: None   Collection Time: 09/01/20  8:09 AM   Specimen: Nasal Mucosa; Nasal Swab  Result Value Ref Range  Status   MRSA, PCR NEGATIVE NEGATIVE Final   Staphylococcus aureus NEGATIVE NEGATIVE Final    Comment: (NOTE) The Xpert SA Assay (FDA approved for NASAL specimens in patients 70 years of age and older), is one component of a comprehensive surveillance program. It is not intended to diagnose infection nor to guide or monitor treatment. Performed at [facility], [place] [address], [place], [place] [address]    Radiology Studies: DG CHEST PORT 1 VIEW  Result Date: 09/02/2020 CLINICAL DATA:  PICC placement. EXAM: PORTABLE CHEST 1 VIEW COMPARISON:  Earlier today. FINDINGS: Right upper extremity PICC is been retracted, tip now in the mid SVC. No pneumothorax. Persistent low lung volumes with bibasilar atelectasis and vascular crowding. Enteric tube remains in place. Stable heart size and mediastinal contours. IMPRESSION: 1. Tip of the right upper extremity PICC in the mid SVC. No pneumothorax. 2. Persistent low lung volumes with bibasilar atelectasis and vascular crowding. Electronically Signed   By: Keith Rake M.D.   On: 09/02/2020 23:02   DG CHEST PORT 1 VIEW  Result Date: 09/02/2020 CLINICAL DATA:  PICC line placement EXAM: PORTABLE CHEST 1 VIEW COMPARISON:  04/22/2019 FINDINGS: Lung volumes are small and there is resultant vascular crowding at the hila. Mild bibasilar atelectasis. No pneumothorax or pleural effusion. Right upper extremity PICC line tip is seen within the mid right atrium, roughly 4 cm distal to the superior cavoatrial junction. Nasogastric tube extends into the left upper quadrant of the abdomen. Cardiac size is within normal limits when accounting for poor pulmonary insufflation. No acute bone abnormality. IMPRESSION: Right upper extremity PICC line tip within the right atrium  4 cm distal to the superior cavoatrial junction. Pulmonary hypoinflation with bibasilar atelectasis. Electronically Signed   By: Fidela [place] MD   On: 09/02/2020 21:46   DG Abd Portable  1V  Result Date: 09/02/2020 CLINICAL DATA:  Small-bowel obstruction. EXAM: PORTABLE ABDOMEN - 1 VIEW COMPARISON:  09/01/2020. FINDINGS: Upper abdomen not imaged. Continued progression of prominent small bowel distention with a paucity of colonic gas. Findings again consistent with persistent small-bowel obstruction. No free air identified. Degenerative changes lumbar spine and both hips. IMPRESSION: Continued progression of prominent small bowel distention. Findings consistent with persistent small-bowel obstruction. Electronically Signed   By: Marcello Moores  Register   On: 09/02/2020 08:50   [place] EKG SITE RITE  Result Date: 09/02/2020 If Site Rite image not attached, placement could not be confirmed due to current cardiac rhythm.    Scheduled Meds:  Chlorhexidine Gluconate Cloth  6 each Topical Daily   insulin aspart  0-15 Units Subcutaneous Q4H   lidocaine  1 patch Transdermal Q24H   pantoprazole (PROTONIX) IV  40 mg Intravenous Q24H   sodium chloride flush  10-40 mL Intracatheter Q12H   Continuous Infusions:  sodium chloride 10 mL/hr at 09/02/20 0645   acetaminophen Stopped (09/03/20 2351)   heparin 1,400 Units/hr (09/04/20 0449)   lactated ringers 50 mL/hr at 09/04/20 0400   methocarbamol (ROBAXIN) IV 1,000 mg (09/04/20 0500)   piperacillin-tazobactam (ZOSYN)  IV 3.375 g (09/04/20 0534)   TPN ADULT (ION) 65 mL/hr at 09/04/20 0400     LOS: 6 days   Time spent: 31mn  Silvia Markuson C Melyssa Signor, DO [facility]  If 7PM-7AM, please contact night-coverage [email or web address]  09/04/2020, 7:25 AM

## 2020-09-04 NOTE — Progress Notes (Signed)
[place] for apixaban>> heparin Indication: atrial fibrillation and bridging while NPO for SBO  Allergies  Allergen Reactions   Other Other (See Comments)    Some form of numbing medication used by dentist, almost died   Simvastatin Other (See Comments)    Muscle aches     Patient Measurements: Height: 6' (182.9 cm) Weight: 96.5 kg (212 lb 11.9 oz) IBW/kg (Calculated) : 77.6 Heparin Dosing Weight: 97.4 kg   Vital Signs: Temp: 97 F (36.1 C) (07/22 1506) Temp Source: Axillary (07/22 1506) BP: 89/60 (07/22 1600) Pulse Rate: 97 (07/22 0600)  Labs: Recent Labs    09/02/20 0508 09/03/20 0500 09/03/20 0641 09/03/20 1900 09/03/20 2043 09/04/20 0300 09/04/20 0719 09/04/20 1500  HGB 16.0 14.3  --   --   --  14.2  --   --   HCT 48.2 43.3  --   --   --  43.0  --   --   PLT 191 131*  --   --   --  145*  --   --   HEPARINUNFRC 0.49 <0.10*  --    < > 0.17*  --  0.29* 0.20*  CREATININE 1.09  --  0.84  --   --  0.76  --   --    < > = values in this interval not displayed.     Estimated Creatinine Clearance: 88.8 mL/min (by C-G formula based on SCr of 0.76 mg/dL).   Assessment: 56 yof with known hx of Afib presented with ab pain - now found to have SBO and is NPO, requiring parenteral anticoagulation for atrial fibrillation (CHADSVASC 6). Last dose apixaban on 7/15'@1200'$ . Patient s/p ex-lap yesteday with SBO resection. Heparin held post operative per surgery. Pharmacy now consulted to resume heparin with no bolus.   Heparin level this evening was SUBtherapeutic at 0.2. PIV was noted to infiltrate earlier today and Heparin was switched to the central access - level only reflective of ~4h at new IV site, will recheck after a full 8h have passed.   HL recheck this evening resulted as 0.1 - oddly lower. RN double checked IV site and infusing appropriately. Drip was paused 20 minutes prior to level being drawn, which likely caused the level to be  lower but also still likely lower than goal range. Will increase but not aggressively.   Goal of Therapy:  Heparin level 0.3-0.7 units/ml Monitor platelets by anticoagulation protocol: Yes   Plan:  - Increase to 1550 units/hr (15.5 ml/hr) - Will continue to monitor for any signs/symptoms of bleeding and will follow up with heparin level in 8 hours   Thank you for allowing pharmacy to be a part of this patient's care.  Alycia Rossetti, PharmD, BCPS Clinical Pharmacist Clinical phone for 09/04/2020: [phone] 09/04/2020 9:36 PM   **Pharmacist phone directory can now be found on [email or web address] ([email or web address]).  Listed under [place].

## 2020-09-04 NOTE — Progress Notes (Signed)
[place] for apixaban>> heparin Indication: atrial fibrillation and bridging while NPO for SBO  Allergies  Allergen Reactions   Other Other (See Comments)    Some form of numbing medication used by dentist, almost died   Simvastatin Other (See Comments)    Muscle aches     Patient Measurements: Height: 6' (182.9 cm) Weight: 96.5 kg (212 lb 11.9 oz) IBW/kg (Calculated) : 77.6 Heparin Dosing Weight: 97.4 kg   Vital Signs: Temp: 98.1 F (36.7 C) (07/22 0713) Temp Source: Oral (07/22 0713) BP: 136/83 (07/22 0600) Pulse Rate: 97 (07/22 0600)  Labs: Recent Labs    09/02/20 0508 09/03/20 0500 09/03/20 0641 09/03/20 1900 09/03/20 2043 09/04/20 0300 09/04/20 0719  HGB 16.0 14.3  --   --   --  14.2  --   HCT 48.2 43.3  --   --   --  43.0  --   PLT 191 131*  --   --   --  145*  --   HEPARINUNFRC 0.49 <0.10*  --  <0.10* 0.17*  --  0.29*  CREATININE 1.09  --  0.84  --   --  0.76  --      Estimated Creatinine Clearance: 88.8 mL/min (by C-G formula based on SCr of 0.76 mg/dL).   Assessment: 29 yof with known hx of Afib presented with ab pain - now found to have SBO and is NPO, requiring parenteral anticoagulation for atrial fibrillation (CHADSVASC 6). Last dose apixaban on 7/15'@1200'$ . Patient s/p ex-lap yesteday with SBO resection. Heparin held post operative per surgery. Pharmacy now consulted to resume heparin with no bolus.   Initial heparin level yesterday evening resulted as undetectable at <0.1, re-draw was up to 0.17. Noted previously therapeutic at 1350 units/hr - will increase but not aggressively given recent surgery and known recent therapeutic rates. No bleeding or issues noted per discussion per RN. This AM HL was 0.29 - increased slightly to 1450 units/hr per information above.  Goal of Therapy:  Heparin level 0.3-0.7 units/ml Monitor platelets by anticoagulation protocol: Yes   Plan:  - Increase Heparin to 1450 units/hr   - Will continue to monitor for any signs/symptoms of bleeding and will follow up with heparin level in 8 hours   Thank you for allowing pharmacy to be a part of this patient's care.  Joetta Manners, PharmD, [facility] Emergency Medicine Clinical Pharmacist ED RPh Phone: [place]: [phone]

## 2020-09-04 NOTE — Evaluation (Signed)
Occupational Therapy Evaluation Patient Details Name: William Norman MRN: [id number] DOB: 12/23/1940 Today's Date: 09/04/2020    History of Present Illness 80 y.o. male presenting with abdominal pain 08/28/20. +small bowel obstruction, rate-controlled afib; 09/02/20 S/P exploratory laparotomy, extensive LOA, SBR x1    PMH significant for atrial fibrillation on Eliquis, CAD, history of CVA, hypertension, perforated bowel in 2010, colostomy reversal in 2011, and SBO in 2016 that resolved with conservative management, now   Clinical Impression   This 80 yo male admitted and underwent above presents to acute OT with PLOF of being totally independent with all basic ADLs. Currently he is setup/S-Mod A for basic ADLs due to pain in abdomen with movement and decreased balance. He will continue to benefit from acute OT without need for followup.    Follow Up Recommendations  No OT follow up;Supervision - Intermittent    Equipment Recommendations  None recommended by OT           Precautions / Restrictions Precautions Precautions: Fall Precaution Comments: NG tube; abd VAC dressing, monitor HR Restrictions Weight Bearing Restrictions: No      Mobility Bed Mobility Overal bed mobility: Needs Assistance Bed Mobility: Rolling;Sidelying to Sit Rolling: Min guard Sidelying to sit: Min assist       General bed mobility comments: A partially for trunk    Transfers Overall transfer level: Needs assistance Equipment used: 1 person hand held assist Transfers: Sit to/from Stand Sit to Stand: Min assist         General transfer comment: Min A to ambulate pushing IV pole in room with brief period outside room door to turn around and go back into room (increased tachy AFIb rate into 150-170's)        ADL either performed or assessed with clinical judgement   ADL Overall ADL's : Needs assistance/impaired Eating/Feeding: NPO Eating/Feeding Details (indicate cue type and  reason): But if was able to eat would be independent Grooming: Set up;Sitting   Upper Body Bathing: Set up;Sitting   Lower Body Bathing: Moderate assistance Lower Body Bathing Details (indicate cue type and reason): min A sit<>stand Upper Body Dressing : Set up;Sitting   Lower Body Dressing: Moderate assistance Lower Body Dressing Details (indicate cue type and reason): min A sit<>stand Toilet Transfer: Minimal assistance;+2 for safety/equipment Toilet Transfer Details (indicate cue type and reason): pushing one IV pole (he has two currenlty) Toileting- Clothing Manipulation and Hygiene: Minimal assistance;Sit to/from stand               Vision Patient Visual Report: No change from baseline              Pertinent Vitals/Pain Pain Assessment: Faces Faces Pain Scale: Hurts little more Pain Location: abdomen Pain Descriptors / Indicators: Guarding;Operative site guarding Pain Intervention(s): Limited activity within patient's tolerance;Monitored during session;Repositioned     Hand Dominance Right   Extremity/Trunk Assessment Upper Extremity Assessment Upper Extremity Assessment: Overall WFL for tasks assessed           Communication Communication Communication: No difficulties   Cognition Arousal/Alertness: Awake/alert Behavior During Therapy: WFL for tasks assessed/performed Overall Cognitive Status: Within Functional Limits for tasks assessed                                                Home Living Family/patient expects to be discharged to:: Private residence  Living Arrangements: Spouse/significant other;Children Available Help at Discharge: Family;Available PRN/intermittently Type of Home: House Home Access: Stairs to enter [employer] of Steps: 1 Entrance Stairs-Rails: None Home Layout: Two level;Laundry or work area in basement;Able to live on main level with bedroom/bathroom     Bathroom Shower/Tub: Production designer, theatre/television/film: None          Prior Functioning/Environment Level of Independence: Independent        Comments: Drives, cooking, cleaning.  No falls.        OT Problem List: Decreased range of motion;Impaired balance (sitting and/or standing);Pain      OT Treatment/Interventions: Self-care/ADL training;DME and/or AE instruction;Patient/family education;Balance training    OT Goals(Current goals can be found in the care plan section) Acute Rehab OT Goals Patient Stated Goal: be able to go home without need of DME OT Goal Formulation: With patient Time For Goal Achievement: 09/18/20 Potential to Achieve Goals: Good  OT Frequency: Min 2X/week    AM-PAC OT "6 Clicks" Daily Activity     Outcome Measure Help from another person eating meals?: None Help from another person taking care of personal grooming?: A Little Help from another person toileting, which includes using toliet, bedpan, or urinal?: A Little Help from another person bathing (including washing, rinsing, drying)?: A Lot Help from another person to put on and taking off regular upper body clothing?: A Little Help from another person to put on and taking off regular lower body clothing?: A Lot 6 Click Score: 17   End of Session Equipment Utilized During Treatment: Gait belt Nurse Communication: Mobility status (also aware of increased tachy Afib rate)  Activity Tolerance: Patient tolerated treatment well Patient left: in chair;with call bell/phone within reach;with chair alarm set  OT Visit Diagnosis: Unsteadiness on feet (R26.81);Muscle weakness (generalized) (M62.81);Pain Pain - part of body:  (abdomen)                Time: [id number] OT Time Calculation (min): 27 min Charges:  OT General Charges $OT Visit: 1 Visit OT Evaluation $OT Eval Moderate Complexity: 1 Mod OT Treatments $Self Care/Home Management : 8-22 mins  [place]en Circle, OTR/L Acute [employer] Pager [phone] Office [phone]    Almon Register 09/04/2020, 11:20 AM

## 2020-09-04 NOTE — Progress Notes (Signed)
PHARMACY - TOTAL PARENTERAL NUTRITION CONSULT NOTE  Indication: SBO  Patient Measurements: Height: 6' (182.9 cm) Weight: 96.5 kg (212 lb 11.9 oz) IBW/kg (Calculated) : 77.6 TPN AdjBW (KG): 82.8 Body mass index is 28.85 kg/m. Usual Weight: 97-100 kg  Assessment:  79 YOM presented on 7/15 with acute abdominal pain.  PMH significant for GERD, bowel perforation in 2010, colostomy reversal in 2011 and SBO in 2016.  CT showed possible closed loop.  Initially treated with conservative management, but abdominal xray shows progressive SB distention consistent with SBO.  Pharmacy consulted to TPN management given likely surgery.  Patient denies recent weight loss and reports eating 2 meals a day.  He usually eats fruits for breakfast, fast food for lunch (Taco Bell, McDonald's, etc) and 1-2 meals per week for supper, which is usually a sandwich.    Glucose / Insulin: no hx DM. CBGs 115 - 185. Elevated with initiation of TPN. Utilized 3 units/24 hrs. - A1c 5.9 in June 2022 Electrolytes: Na 144. K 3.1 -dropped with discontinuation of IVF with K (~48 mEq) despite increase of K in TPN; 4 runs given this AM. Cl wnl. Corr Ca 9.9. Phos 2.6 (low end after increase in TPN and replacement given- total of 80mol given). Mg wnl. Renal: SCr stable- appears at baseline. BUN down. Hepatic: LFTs WNL. TG 255 after receiving Propofol bolus in OR. Albumin 2.5. Prealbumin 14.5 Intake / Output; MIVF: UOP 0.3 ml/kg/hr, NG output not recorded last 24 hrs. LR at 50 ml/hr. Net +2.7L. No edema on exam.  GI Imaging: none since TPN GI Surgeries / Procedures:   - 7/20 ex-lap: SBO with closed loop obstruction in LUQ, small bowel resection (~40 cm), lysis of adhesions  Central access: 09/02/20 TPN start date: 09/02/20  Nutritional Goals (RD recommendation 7/21): 2200-2400 kCal, 125-155g AA per day, fluid >/= 2.2 L  Current Nutrition:  NPO TPN - goal rate 1040mhr to prove 139g AA and 2237 kcal - Using 20% of total kcal  instead of 30% due to elevated TG  Plan:  Increase TPN to 100 ml/hr - this will provide 129 g AA and 2299 kcal.  Electrolytes in TPN: Increase phos to 20 mmol/L, increase K to  45 mEq/L, Increase Mg 7.55m81mL. Continue Na 2m77m, Ca 55mEq57m Cl:[id number] now.  Add standard MVI and trace elements to TPN Continue moderate SSI q4h Standard TPN labs Monday/Thursday, repeat electrolytes tomorrow Thiamine '100mg'$  x7 days in TPN per RD recommendation (ends 7/27) Recheck TG tomorrow to consider increasing lipids to 30% total kcal Repeat BMP, Mg, and Phos in AM.  *Recommend stop IVF    JessiSloan LeiterrmD, BCPS, BCCCP Clinical Pharmacist Please refer to [facility] 09/04/2020, 9:15 AM

## 2020-09-05 DIAGNOSIS — K56609 Unspecified intestinal obstruction, unspecified as to partial versus complete obstruction: Secondary | ICD-10-CM | POA: Diagnosis not present

## 2020-09-05 LAB — BASIC METABOLIC PANEL
Anion gap: 6 (ref 5–15)
BUN: 23 mg/dL (ref 8–23)
CO2: 28 mmol/L (ref 22–32)
Calcium: 8.8 mg/dL — ABNORMAL LOW (ref 8.9–10.3)
Chloride: 107 mmol/L (ref 98–111)
Creatinine, Ser: 0.74 mg/dL (ref 0.61–1.24)
GFR, Estimated: >60 mL/min (ref >60)
Glucose, Bld: 106 mg/dL — ABNORMAL HIGH (ref 70–99)
Potassium: 3.4 mmol/L — ABNORMAL LOW (ref 3.5–5.1)
Sodium: 141 mmol/L (ref 135–145)

## 2020-09-05 LAB — GLUCOSE, CAPILLARY
Glucose-Capillary: 106 mg/dL — ABNORMAL HIGH (ref 70–99)
Glucose-Capillary: 121 mg/dL — ABNORMAL HIGH (ref 70–99)
Glucose-Capillary: 125 mg/dL — ABNORMAL HIGH (ref 70–99)
Glucose-Capillary: 125 mg/dL — ABNORMAL HIGH (ref 70–99)
Glucose-Capillary: 138 mg/dL — ABNORMAL HIGH (ref 70–99)
Glucose-Capillary: 167 mg/dL — ABNORMAL HIGH (ref 70–99)

## 2020-09-05 LAB — MAGNESIUM: Magnesium: 2 mg/dL (ref 1.7–2.4)

## 2020-09-05 LAB — HEPARIN LEVEL (UNFRACTIONATED)
Heparin Unfractionated: 0.19 IU/mL — ABNORMAL LOW (ref 0.30–0.70)
Heparin Unfractionated: 0.39 IU/mL (ref 0.30–0.70)

## 2020-09-05 LAB — TRIGLYCERIDES: Triglycerides: 88 mg/dL (ref <150)

## 2020-09-05 LAB — PHOSPHORUS: Phosphorus: 2.2 mg/dL — ABNORMAL LOW (ref 2.5–4.6)

## 2020-09-05 MED ORDER — TRAVASOL 10 % IV SOLN
INTRAVENOUS | Status: AC
  Filled 2020-09-05: qty 1392

## 2020-09-05 MED ORDER — POTASSIUM PHOSPHATES 15 MMOLE/5ML IV SOLN
20.0000 mmol | Freq: Once | INTRAVENOUS | Status: AC
  Administered 2020-09-05: 20 mmol via INTRAVENOUS
  Filled 2020-09-05: qty 6.67

## 2020-09-05 MED ORDER — POTASSIUM CHLORIDE 10 MEQ/100ML IV SOLN
10.0000 meq | INTRAVENOUS | Status: AC
  Administered 2020-09-05 (×4): 10 meq via INTRAVENOUS
  Filled 2020-09-05: qty 100

## 2020-09-05 NOTE — Progress Notes (Signed)
[place] for apixaban>> heparin Indication: atrial fibrillation and bridging while NPO for SBO  Allergies  Allergen Reactions   Other Other (See Comments)    Some form of numbing medication used by dentist, almost died   Simvastatin Other (See Comments)    Muscle aches     Patient Measurements: Height: 6' (182.9 cm) Weight: 96.5 kg (212 lb 11.9 oz) IBW/kg (Calculated) : 77.6 Heparin Dosing Weight: 97.4 kg   Vital Signs: Temp: 98.5 F (36.9 C) (07/23 0810) Temp Source: Oral (07/23 0810) BP: 141/82 (07/23 0810) Pulse Rate: 94 (07/23 0810)  Labs: Recent Labs    09/03/20 0500 09/03/20 0641 09/03/20 1900 09/04/20 0300 09/04/20 0719 09/04/20 1500 09/04/20 2000 09/05/20 0630  HGB 14.3  --   --  14.2  --   --   --   --   HCT 43.3  --   --  43.0  --   --   --   --   PLT 131*  --   --  145*  --   --   --   --   HEPARINUNFRC <0.10*  --    < >  --    < > 0.20* 0.10* 0.19*  CREATININE  --  0.84  --  0.76  --   --   --  0.74   < > = values in this interval not displayed.     Estimated Creatinine Clearance: 88.8 mL/min (by C-G formula based on SCr of 0.74 mg/dL).   Assessment: 49 yof with known hx of Afib presented with ab pain - now found to have SBO and is NPO, requiring parenteral anticoagulation for atrial fibrillation (CHADSVASC 6). Last dose apixaban on 7/15'@1200'$ . Patient s/p ex-lap with SBO resection. Heparin held post operative per surgery. Pharmacy consulted to resume heparin with no bolus.   Heparin level: 0.19 units/mL, below goal  Nurse noted infusion running appropriately since last night. No s/s of bleeding.   Goal of Therapy:  Heparin level 0.3-0.7 units/ml Monitor platelets by anticoagulation protocol: Yes   Plan:  - Increase to Heparin 1850 units/hr - follow up with heparin level in 8 hours  -daily CBC and heparin level - Will continue to monitor for any s/s of bleeding   Thank you for allowing pharmacy to  participate in this patient's care.  Levonne Spiller, PharmD PGY1 Acute Care Resident  09/05/2020,8:37 AM

## 2020-09-05 NOTE — Progress Notes (Signed)
Pt HR ranges 120-144. PRN Metoprolol '5mg'$  IV given.  After 49mns HR ranges 100-110.

## 2020-09-05 NOTE — Progress Notes (Signed)
PROGRESS NOTE  William Norman [id number] DOB: [birth year] DOA: 08/28/2020 PCP: Vivi Barrack, MD  HPI/Recap of past 24 hours: 6 this is an 80 year old male with medical history significant for atrial fibrillation on Eliquis, coronary artery disease, history of CVA,, hypertension, perforated bowel in 2010 colostomy status post colostomy reversal in 2011 and small bowel obstruction 2016 that resolved with conservative management.  He now presents with abdominal pain and was admitted on August 28, 2020  Patient seen and examined at bedside still complaining of pain but he stated as long as as he is lying quietly his pain is controlled but when he coughs or moves he has a severe sharp pain and at that time his medicine is not helping Patient denies any passage of gas or bowel movement yet  Assessment/Plan: Principal Problem:   SBO (small bowel obstruction) ([place]) Active Problems:   Essential hypertension   Personal history of DVT (deep vein thrombosis)   Persistent atrial fibrillation (HCC)   CAD (coronary artery disease)   History of ischemic stroke   Recurrent UTI   Protein-calorie malnutrition, severe  1.  Small bowel obstruction status post exploratory laparotomy/resection Patient is followed by general surgery Continue TPN Continue NG tube with intermittent suction  2.  Atrial fibrillation rate controlled Eliquis is on hold due to operation Continue IV heparin  3.  Urinary tract infection prior to admission Patient was started on ceftriaxone and he has had 3 doses  4.  History of CVA history of coronary artery disease Statins beta-blocker and Eliquis on hold due to medical condition  Code Status: Full  Severity of Illness: The appropriate patient status for this patient is INPATIENT. Inpatient status is judged to be reasonable and necessary in order to provide the required intensity of service to ensure the patient's safety. The patient's presenting symptoms,  physical exam findings, and initial radiographic and laboratory data in the context of their chronic comorbidities is felt to place them at high risk for further clinical deterioration. Furthermore, it is not anticipated that the patient will be medically stable for discharge from the hospital within 2 midnights of admission. The following factors support the patient status of inpatient.   " Small bowel obstruction NG tube n.p.o.   * I certify that at the point of admission it is my clinical judgment that the patient will require inpatient hospital care spanning beyond 2 midnights from the point of admission due to high intensity of service, high risk for further deterioration and high frequency of surveillance required.*   Family Communication: None at bedside  Disposition Plan:   Status is: Inpatient   Dispo: The patient is from: Home              Anticipated d/c is to:               Anticipated d/c date is:               Patient currently not medically stable for discharge  Consultants: General surgery  Procedures: None  Antimicrobials: Ceftriaxone x3 days completed  DVT prophylaxis: Heparin   Objective: Vitals:   09/04/20 1900 09/04/20 2254 09/05/20 0433 09/05/20 0810  BP: 115/78 (!) 151/80 (!) 143/82 (!) 141/82  Pulse:  85 99 94  Resp: (!) '21 17 18 18  '$ Temp: 98.2 F (36.8 C) 99.5 F (37.5 C) 98.6 F (37 C) 98.5 F (36.9 C)  TempSrc: Oral Oral Oral Oral  SpO2:  97% 98% 97%  Weight:  Height:        Intake/Output Summary (Last 24 hours) at 09/05/2020 0937 Last data filed at 09/05/2020 0526 Gross per 24 hour  Intake 2825.46 ml  Output 725 ml  Net 2100.46 ml   Filed Weights   08/29/20 0100 08/30/20 0501 09/01/20 0457  Weight: 97.1 kg 99.1 kg 96.5 kg   Body mass index is 28.85 kg/m.  Exam:  General: 80 y.o. year-old male well developed chronically ill looking in no acute distress.  Alert and oriented x3.  NG tube is in place and  draining Cardiovascular: Regular rate and rhythm with no rubs or gallops.  No thyromegaly or JVD noted.   Respiratory: Clear to auscultation with no wheezes or rales. Good inspiratory effort. Abdomen: Soft tender to light palpation, distended with scant to no bowel sounds bowel sounds x4 quadrants. Musculoskeletal: No lower extremity edema. 2/4 pulses in all 4 extremities. Skin: No ulcerative lesions noted or rashes, Psychiatry: Mood is appropriate for condition and setting Neurology: No deficits    Data Reviewed: CBC: Recent Labs  Lab 08/31/20 0521 09/01/20 0358 09/02/20 0508 09/03/20 0500 09/04/20 0300  WBC 11.0* 10.7* 7.9 8.9 12.5*  NEUTROABS  --   --   --  6.9  --   HGB 15.9 16.8 16.0 14.3 14.2  HCT 50.2 51.7 48.2 43.3 43.0  MCV 93.7 90.9 89.8 93.1 92.7  PLT 171 202 191 131* [id number]*   Basic Metabolic Panel: Recent Labs  Lab 09/01/20 0358 09/02/20 0508 09/02/20 1938 09/03/20 0641 09/04/20 0300 09/05/20 0630  NA 140 143  --  144 142 141  K 3.3* 3.8  --  4.9 3.1* 3.4*  CL 101 105  --  111 105 107  CO2 29 17*  --  '28 31 28  '$ GLUCOSE 88 90  --  162* 125* 106*  BUN 23 34*  --  25* 20 23  CREATININE 0.99 1.09  --  0.84 0.76 0.74  CALCIUM 9.6 9.4  --  8.1* 8.7* 8.8*  MG  --   --  1.9 1.9 2.1 2.0  PHOS  --   --  4.2 2.4* 2.6 2.2*   GFR: Estimated Creatinine Clearance: 88.8 mL/min (by C-G formula based on SCr of 0.74 mg/dL). Liver Function Tests: Recent Labs  Lab 09/03/20 0641  AST 32  ALT 24  ALKPHOS 30*  BILITOT 1.0  PROT 4.8*  ALBUMIN 2.5*   No results for input(s): LIPASE, AMYLASE in the last 168 hours. No results for input(s): AMMONIA in the last 168 hours. Coagulation Profile: No results for input(s): INR, PROTIME in the last 168 hours. Cardiac Enzymes: No results for input(s): CKTOTAL, CKMB, CKMBINDEX, TROPONINI in the last 168 hours. BNP (last 3 results) No results for input(s): PROBNP in the last 8760 hours. HbA1C: No results for input(s): HGBA1C in  the last 72 hours. CBG: Recent Labs  Lab 09/04/20 1504 09/04/20 1900 09/05/20 0025 09/05/20 0430 09/05/20 0831  GLUCAP 120* 131* 106* 125* 121*   Lipid Profile: Recent Labs    09/03/20 0500  TRIG 255*   Thyroid Function Tests: No results for input(s): TSH, T4TOTAL, FREET4, T3FREE, THYROIDAB in the last 72 hours. Anemia Panel: No results for input(s): VITAMINB12, FOLATE, FERRITIN, TIBC, IRON, RETICCTPCT in the last 72 hours. Urine analysis:    Component Value Date/Time   COLORURINE YELLOW 08/28/2020 1931   APPEARANCEUR HAZY (A) 08/28/2020 1931   LABSPEC 1.021 08/28/2020 1931   PHURINE 6.0 08/28/2020 1931   GLUCOSEU NEGATIVE 08/28/2020 1931  HGBUR LARGE (A) 08/28/2020 1931   HGBUR 1+ 02/13/2007 0000   BILIRUBINUR NEGATIVE 08/28/2020 1931   BILIRUBINUR negative 09/23/2019 1527   BILIRUBINUR n 10/14/2014 1012   KETONESUR NEGATIVE 08/28/2020 1931   PROTEINUR TRACE (A) 08/28/2020 1931   UROBILINOGEN 0.2 09/23/2019 1527   UROBILINOGEN 0.2 03/21/2014 0420   NITRITE POSITIVE (A) 08/28/2020 1931   LEUKOCYTESUR LARGE (A) 08/28/2020 1931   Sepsis Labs: '@LABRCNTIP'$ (procalcitonin:4,lacticidven:4)  ) Recent Results (from the past 240 hour(s))  Culture, blood (Routine X 2) w Reflex to ID Panel     Status: None   Collection Time: 08/28/20 10:50 PM   Specimen: BLOOD  Result Value Ref Range Status   Specimen Description BLOOD LEFT ANTECUBITAL  Final   Special Requests   Final    BOTTLES DRAWN AEROBIC AND ANAEROBIC Blood Culture adequate volume   Culture   Final    NO GROWTH 5 DAYS Performed at [facility], [name] [address] [place], [place] [address]    Report Status 09/03/2020 FINAL  Final  Culture, blood (Routine X 2) w Reflex to ID Panel     Status: None   Collection Time: 08/28/20 11:00 PM   Specimen: BLOOD  Result Value Ref Range Status   Specimen Description BLOOD FEMORAL ARTERY RIGHT  Final   Special Requests   Final    BOTTLES DRAWN AEROBIC AND ANAEROBIC  Blood Culture adequate volume   Culture   Final    NO GROWTH 5 DAYS Performed at [facility], [place] [address] [place], [place] [address]    Report Status 09/03/2020 FINAL  Final  Urine Culture     Status: Abnormal   Collection Time: 08/28/20 11:22 PM   Specimen: Urine, Random  Result Value Ref Range Status   Specimen Description   Final    URINE, RANDOM Performed at [facility], [address], [place], [place] [address]    Special Requests   Final    NONE Performed at [facility], [address], [place], [place] [address]    Culture MULTIPLE SPECIES PRESENT, SUGGEST RECOLLECTION (A)  Final   Report Status 08/30/2020 FINAL  Final  Resp Panel by RT-PCR (Flu A&B, Covid)     Status: None   Collection Time: 08/28/20 11:22 PM   Specimen: Nasopharyngeal(NP) swabs in vial transport medium  Result Value Ref Range Status   SARS Coronavirus 2 by RT PCR NEGATIVE NEGATIVE Final    Comment: (NOTE) SARS-CoV-2 target nucleic acids are NOT DETECTED.  The SARS-CoV-2 RNA is generally detectable in upper respiratory specimens during the acute phase of infection. The lowest concentration of SARS-CoV-2 viral copies this assay can detect is 138 copies/mL. A negative result does not preclude SARS-Cov-2 infection and should not be used as the sole basis for treatment or other patient management decisions. A negative result may occur with  improper specimen collection/handling, submission of specimen other than nasopharyngeal swab, presence of viral mutation(s) within the areas targeted by this assay, and inadequate number of viral copies(<138 copies/mL). A negative result must be combined with clinical observations, patient history, and epidemiological information. The expected result is Negative.  Fact Sheet for Patients:  EntrepreneurPulse.com.au  Fact Sheet for Healthcare Providers:   IncredibleEmployment.be  This test is no t yet approved or cleared by the [place] FDA and  has been authorized for detection and/or diagnosis of SARS-CoV-2 by FDA under an Emergency Use Authorization (EUA). This EUA will remain  in effect (meaning this  test can be used) for the duration of the COVID-19 declaration under Section 564(b)(1) of the Act, 21 U.S.C.section 360bbb-3(b)(1), unless the authorization is terminated  or revoked sooner.       Influenza A by PCR NEGATIVE NEGATIVE Final   Influenza B by PCR NEGATIVE NEGATIVE Final    Comment: (NOTE) The Xpert Xpress SARS-CoV-2/FLU/RSV plus assay is intended as an aid in the diagnosis of influenza from Nasopharyngeal swab specimens and should not be used as a sole basis for treatment. Nasal washings and aspirates are unacceptable for Xpert Xpress SARS-CoV-2/FLU/RSV testing.  Fact Sheet for Patients: EntrepreneurPulse.com.au  Fact Sheet for Healthcare Providers: IncredibleEmployment.be  This test is not yet approved or cleared by the [place] FDA and has been authorized for detection and/or diagnosis of SARS-CoV-2 by FDA under an Emergency Use Authorization (EUA). This EUA will remain in effect (meaning this test can be used) for the duration of the COVID-19 declaration under Section 564(b)(1) of the Act, 21 U.S.C. section 360bbb-3(b)(1), unless the authorization is terminated or revoked.  Performed at [facility], [address], [place], [place] [address]   Culture, blood (routine x 2)     Status: None   Collection Time: 08/29/20  2:15 AM   Specimen: BLOOD  Result Value Ref Range Status   Specimen Description BLOOD RIGHT ANTECUBITAL  Final   Special Requests   Final    BOTTLES DRAWN AEROBIC AND ANAEROBIC Blood Culture adequate volume   Culture   Final    NO GROWTH 5 DAYS Performed at [facility], [address]  [place], [place] [address]    Report Status 09/03/2020 FINAL  Final  Culture, blood (routine x 2)     Status: None   Collection Time: 08/29/20  2:22 AM   Specimen: BLOOD LEFT HAND  Result Value Ref Range Status   Specimen Description BLOOD LEFT HAND  Final   Special Requests   Final    BOTTLES DRAWN AEROBIC AND ANAEROBIC Blood Culture adequate volume   Culture   Final    NO GROWTH 5 DAYS Performed at [facility], [place] [address], [place], [place] [address]    Report Status 09/03/2020 FINAL  Final  Surgical pcr screen     Status: None   Collection Time: 09/01/20  8:09 AM   Specimen: Nasal Mucosa; Nasal Swab  Result Value Ref Range Status   MRSA, PCR NEGATIVE NEGATIVE Final   Staphylococcus aureus NEGATIVE NEGATIVE Final    Comment: (NOTE) The Xpert SA Assay (FDA approved for NASAL specimens in patients 44 years of age and older), is one component of a comprehensive surveillance program. It is not intended to diagnose infection nor to guide or monitor treatment. Performed at [facility], [place] [address] [place], [place] [address]       Studies: No results found.  Scheduled Meds:  Chlorhexidine Gluconate Cloth  6 each Topical Daily   insulin aspart  0-15 Units Subcutaneous Q4H   lidocaine  1 patch Transdermal Q24H   pantoprazole (PROTONIX) IV  40 mg Intravenous Q24H   sodium chloride flush  10-40 mL Intracatheter Q12H    Continuous Infusions:  sodium chloride 10 mL/hr at 09/02/20 0645   heparin 1,850 Units/hr (09/05/20 0908)   methocarbamol (ROBAXIN) IV 1,000 mg (09/05/20 0609)   piperacillin-tazobactam (ZOSYN)  IV 3.375 g (09/05/20 0526)   potassium chloride 10 mEq (09/05/20 0912)   potassium PHOSPHATE IVPB (in mmol)  TPN ADULT (ION) 100 mL/hr at 09/05/20 0526     LOS: 7 days     Cristal Deer, MD [facility]  To reach me or the doctor on call, go to: [email or web address] Password [facility]  09/05/2020, 9:37 AM

## 2020-09-05 NOTE — Progress Notes (Signed)
Progress Note  3 Days Post-Op  Subjective: No complaints this morning. Pain with deep breaths and sitting up, but well controlled.  Objective: Vital signs in last 24 hours: Temp:  [97 F (36.1 C)-99.5 F (37.5 C)] 98.5 F (36.9 C) (07/23 0810) Pulse Rate:  [85-99] 94 (07/23 0810) Resp:  [17-25] 18 (07/23 0810) BP: (89-151)/(60-82) 141/82 (07/23 0810) SpO2:  [97 %-98 %] 97 % (07/23 0810) Last BM Date: 08/28/20  Intake/Output from previous day: 07/22 0701 - 07/23 0700 In: 3093.4 [I.V.:2170.2; IV Piggyback:923.2] Out: 725 [Urine:475; Emesis/NG output:200; Drains:50] Intake/Output this shift: No intake/output data recorded.  PE: General: pleasant, WD, elderly male who is laying in bed in NAD Heart: RRR, Palpable radial and pedal pulses bilaterally Lungs: CTAB, no wheezes, rhonchi, or rales noted.  Respiratory effort nonlabored Abd: soft, appropriately ttp, ND, BS hypoactive, Midline wound vac with serosanguinous output, NGT with bilious drainage GU: condom cath present with clear yellow urine  Psych: A&Ox3 with an appropriate affect.   Lab Results:  Recent Labs    09/03/20 0500 09/04/20 0300  WBC 8.9 12.5*  HGB 14.3 14.2  HCT 43.3 43.0  PLT 131* 145*    BMET Recent Labs    09/04/20 0300 09/05/20 0630  NA 142 141  K 3.1* 3.4*  CL 105 107  CO2 31 28  GLUCOSE 125* 106*  BUN 20 23  CREATININE 0.76 0.74  CALCIUM 8.7* 8.8*    PT/INR No results for input(s): LABPROT, INR in the last 72 hours. CMP     Component Value Date/Time   NA 141 09/05/2020 0630   NA 141 12/03/2019 1509   K 3.4 (L) 09/05/2020 0630   CL 107 09/05/2020 0630   CO2 28 09/05/2020 0630   GLUCOSE 106 (H) 09/05/2020 0630   GLUCOSE 94 01/10/2006 1108   BUN 23 09/05/2020 0630   BUN 10 12/03/2019 1509   CREATININE 0.74 09/05/2020 0630   CREATININE 0.84 09/30/2019 1349   CALCIUM 8.8 (L) 09/05/2020 0630   PROT 4.8 (L) 09/03/2020 0641   PROT 6.5 12/03/2019 1509   ALBUMIN 2.5 (L)  09/03/2020 0641   ALBUMIN 4.2 12/03/2019 1509   AST 32 09/03/2020 0641   ALT 24 09/03/2020 0641   ALKPHOS 30 (L) 09/03/2020 0641   BILITOT 1.0 09/03/2020 0641   BILITOT 0.9 12/03/2019 1509   GFRNONAA >60 09/05/2020 0630   GFRAA 97 12/03/2019 1509   Lipase     Component Value Date/Time   LIPASE 15 08/28/2020 1931       Studies/Results: No results found.  Anti-infectives: Anti-infectives (From admission, onward)    Start     Dose/Rate Route Frequency Ordered Stop   09/02/20 1845  piperacillin-tazobactam (ZOSYN) IVPB 3.375 g        3.375 g 12.5 mL/hr over 240 Minutes Intravenous Every 8 hours 09/02/20 1819 09/07/20 1359   09/02/20 1215  ceFAZolin (ANCEF) IVPB 2g/100 mL premix        2 g 200 mL/hr over 30 Minutes Intravenous  Once 09/02/20 1115 09/02/20 1320   08/29/20 0600  cefTRIAXone (ROCEPHIN) 1 g in sodium chloride 0.9 % 100 mL IVPB        1 g 200 mL/hr over 30 Minutes Intravenous Every 24 hours 08/29/20 0204 08/31/20 0730   08/28/20 2300  piperacillin-tazobactam (ZOSYN) IVPB 3.375 g        3.375 g 100 mL/hr over 30 Minutes Intravenous  Once 08/28/20 2245 08/28/20 2342  Assessment/Plan Closed loop bowel obstruction POD3 S/P exploratory laparotomy, extensive LOA, SBR x1 09/02/20 Dr. Georgette Dover - patient with expected post-op ileus at this time - continue NGT on LIWS, continue TPN - goal K >4.0 and Mg >2.0 to help optimize bowel function  - mobilize as tolerated, PT/OT  - VAC m/w/f - as of 7/22 wound measures 19.5 cm x 3.5 cm x 3 cm - continue IV abx 5 days post-op for spillage of bowel contents Hypokalemia - K 3.4, getting IV replacement    FEN - NPO, NGT to LIWS, IVF @ 50 cc/h, TPN VTE - SCDs, heparin gtt  ID - Zosyn for 5 days post-op   A. Fib on Eliquis - on hold  History of CVA January 2022 UTI - competed course of rocephin  Hypertension Hyperlipidemia  LOS: 7 days    William Norman, [facility] 09/05/2020, 8:25 AM Please see  Amion for pager number during day hours 7:00am-4:30pm

## 2020-09-05 NOTE — Plan of Care (Signed)
  Problem: Education: Goal: Knowledge of General Education information will improve Description: Including pain rating scale, medication(s)/side effects and non-pharmacologic comfort measures Outcome: Progressing   Problem: Clinical Measurements: Goal: Ability to maintain clinical measurements within normal limits will improve Outcome: Progressing Goal: Diagnostic test results will improve Outcome: Progressing   Problem: Nutrition: Goal: Adequate nutrition will be maintained Outcome: Progressing   Problem: Elimination: Goal: Will not experience complications related to urinary retention Outcome: Progressing   Problem: Pain Managment: Goal: General experience of comfort will improve Outcome: Progressing   Problem: Safety: Goal: Ability to remain free from injury will improve Outcome: Progressing   Problem: Clinical Measurements: Goal: Postoperative complications will be avoided or minimized Outcome: Progressing

## 2020-09-05 NOTE — Progress Notes (Signed)
[place] for apixaban>> heparin Indication: atrial fibrillation and bridging while NPO for SBO  Patient Measurements: Height: 6' (182.9 cm) Weight: 96.5 kg (212 lb 11.9 oz) IBW/kg (Calculated) : 77.6 Heparin Dosing Weight: 97.4 kg   Vital Signs: Temp: 99.4 F (37.4 C) (07/23 2020) Temp Source: Oral (07/23 2020) BP: 152/97 (07/23 2020) Pulse Rate: 102 (07/23 2020)  Labs: Recent Labs    09/03/20 0500 09/03/20 0641 09/03/20 1900 09/04/20 0300 09/04/20 0719 09/04/20 2000 09/05/20 0630 09/05/20 1857  HGB 14.3  --   --  14.2  --   --   --   --   HCT 43.3  --   --  43.0  --   --   --   --   PLT 131*  --   --  145*  --   --   --   --   HEPARINUNFRC <0.10*  --    < >  --    < > 0.10* 0.19* 0.39  CREATININE  --  0.84  --  0.76  --   --  0.74  --    < > = values in this interval not displayed.     Estimated Creatinine Clearance: 88.8 mL/min (by C-G formula based on SCr of 0.74 mg/dL).   Assessment: 89 yof with known hx of Afib presented with ab pain - now found to have SBO and is NPO, requiring parenteral anticoagulation for atrial fibrillation (CHADSVASC 6). Last dose apixaban on 7/15'@1200'$ . Patient s/p ex-lap yesteday with SBO resection. Heparin held post operative per surgery. Pharmacy now consulted to resume heparin with no bolus.   Heparin level this evening is therapeutic after a rate increase earlier today (HL 0.39, goal of 0.3-0.7). No bleeding or issued noted per RN.  Goal of Therapy:  Heparin level 0.3-0.7 units/ml Monitor platelets by anticoagulation protocol: Yes   Plan:  - Continue Heparin at 1850 units/hr (18.5 ml/hr) - Will continue to monitor for any signs/symptoms of bleeding and will follow up with heparin level in 8 hours   Thank you for allowing pharmacy to be a part of this patient's care.  Alycia Rossetti, PharmD, BCPS Clinical Pharmacist Clinical phone for 09/05/2020: [phone] 09/05/2020 9:15 PM   **Pharmacist  phone directory can now be found on [email or web address] ([email or web address]).  Listed under [place].

## 2020-09-05 NOTE — Progress Notes (Signed)
PHARMACY - TOTAL PARENTERAL NUTRITION CONSULT NOTE  Indication: SBO  Patient Measurements: Height: 6' (182.9 cm) Weight: 96.5 kg (212 lb 11.9 oz) IBW/kg (Calculated) : 77.6 TPN AdjBW (KG): 82.8 Body mass index is 28.85 kg/m. Usual Weight: 97-100 kg  Assessment:  79 YOM presented on 7/15 with acute abdominal pain.  PMH significant for GERD, bowel perforation in 2010, colostomy reversal in 2011 and SBO in 2016.  CT showed possible closed loop.  Initially treated with conservative management, but abdominal xray shows progressive SB distention consistent with SBO.  Pharmacy consulted to TPN management given likely surgery.  Patient denies recent weight loss and reports eating 2 meals a day.  He usually eats fruits for breakfast, fast food for lunch ([employer], [employer], etc) and 1-2 meals per week for supper, which is usually a sandwich.    Glucose / Insulin: no hx DM. CBGs 106-133. Utilized 6 units/24 hrs. - A1c 5.9 in June 2022 Electrolytes: Na 141. K 3.4 (s/p 40 mEq IV), CoCa 10, Mg 2.0. Other electrolytes wnl.  Renal: SCr stable- appears at baseline. BUN wnl. Hepatic: LFTs WNL. TG 255 after receiving Propofol bolus in OR. Repeat TG still in process (called lab with no results yet posted). Albumin 2.5. Prealbumin 14.5 Intake / Output; MIVF: UOP 0.2 ml/kg/hr, NG 200 ml. GI Imaging: none since TPN GI Surgeries / Procedures:   - 7/20 ex-lap: SBO with closed loop obstruction in LUQ, small bowel resection (~40 cm), lysis of adhesions  Central access: 09/02/20 TPN start date: 09/02/20  Nutritional Goals (RD recommendation 7/21): 2200-2400 kCal, 125-155g AA per day, fluid >/= 2.2 L  Current Nutrition:  NPO TPN - goal rate 149m/hr to prove 139g AA and 2237 kcal - Using 20% of total kcal instead of 30% due to elevated TG  Plan:  Increase TPN to 100 ml/hr - this will provide 129 g AA and 2299 kcal.  Electrolytes in TPN: Increase K to 50 mEq/L, increase phos to 25 mmol/L. Continue Na  538m/L, Ca 8m43mL MG 7.5 mEq/L. Continue Cl:[id number]. K phos [id number]  Add standard MVI and trace elements to TPN Continue moderate SSI q4h Standard TPN labs Monday/Thursday, repeat electrolytes tomorrow Thiamine '100mg'$  x7 days in TPN per RD recommendation (ends 7/27) TPN labs Monday/Thursday, repeat electrolytes tomorrow Follow up repeat TG that is still in process (must proceed without results due to cut off time of TPN) and consider increasing lipids to 30% tomorrow    GracCristela FeltarmD, BCPS Clinical Pharmacist  09/05/2020, 8:25 AM

## 2020-09-06 ENCOUNTER — Inpatient Hospital Stay (HOSPITAL_COMMUNITY): Payer: Medicare HMO

## 2020-09-06 DIAGNOSIS — K56609 Unspecified intestinal obstruction, unspecified as to partial versus complete obstruction: Secondary | ICD-10-CM | POA: Diagnosis not present

## 2020-09-06 LAB — HEPARIN LEVEL (UNFRACTIONATED): Heparin Unfractionated: 0.59 IU/mL (ref 0.30–0.70)

## 2020-09-06 LAB — BASIC METABOLIC PANEL
Anion gap: 7 (ref 5–15)
BUN: 35 mg/dL — ABNORMAL HIGH (ref 8–23)
CO2: 23 mmol/L (ref 22–32)
Calcium: 9.2 mg/dL (ref 8.9–10.3)
Chloride: 109 mmol/L (ref 98–111)
Creatinine, Ser: 1.36 mg/dL — ABNORMAL HIGH (ref 0.61–1.24)
GFR, Estimated: 53 mL/min — ABNORMAL LOW (ref >60)
Glucose, Bld: 125 mg/dL — ABNORMAL HIGH (ref 70–99)
Potassium: 4.1 mmol/L (ref 3.5–5.1)
Sodium: 139 mmol/L (ref 135–145)

## 2020-09-06 LAB — MAGNESIUM: Magnesium: 2.1 mg/dL (ref 1.7–2.4)

## 2020-09-06 LAB — CBC
HCT: 39.4 % (ref 39.0–52.0)
Hemoglobin: 12.7 g/dL — ABNORMAL LOW (ref 13.0–17.0)
MCH: 30 pg (ref 26.0–34.0)
MCHC: 32.2 g/dL (ref 30.0–36.0)
MCV: 93.1 fL (ref 80.0–100.0)
Platelets: 219 10*3/uL (ref 150–400)
RBC: 4.23 MIL/uL (ref 4.22–5.81)
RDW: 14.6 % (ref 11.5–15.5)
WBC: 15.3 10*3/uL — ABNORMAL HIGH (ref 4.0–10.5)
nRBC: 0 % (ref 0.0–0.2)

## 2020-09-06 LAB — GLUCOSE, CAPILLARY
Glucose-Capillary: 113 mg/dL — ABNORMAL HIGH (ref 70–99)
Glucose-Capillary: 122 mg/dL — ABNORMAL HIGH (ref 70–99)
Glucose-Capillary: 135 mg/dL — ABNORMAL HIGH (ref 70–99)
Glucose-Capillary: 142 mg/dL — ABNORMAL HIGH (ref 70–99)
Glucose-Capillary: 158 mg/dL — ABNORMAL HIGH (ref 70–99)

## 2020-09-06 LAB — PHOSPHORUS: Phosphorus: 3.7 mg/dL (ref 2.5–4.6)

## 2020-09-06 IMAGING — DX DG ABD PORTABLE 1V
1 series · 1 of 1 positions shown · non-contrast
Comparison: [DATE] abdominal radiograph

CLINICAL DATA: NG tube placement

EXAM:
PORTABLE ABDOMEN - 1 VIEW

[abdomen]
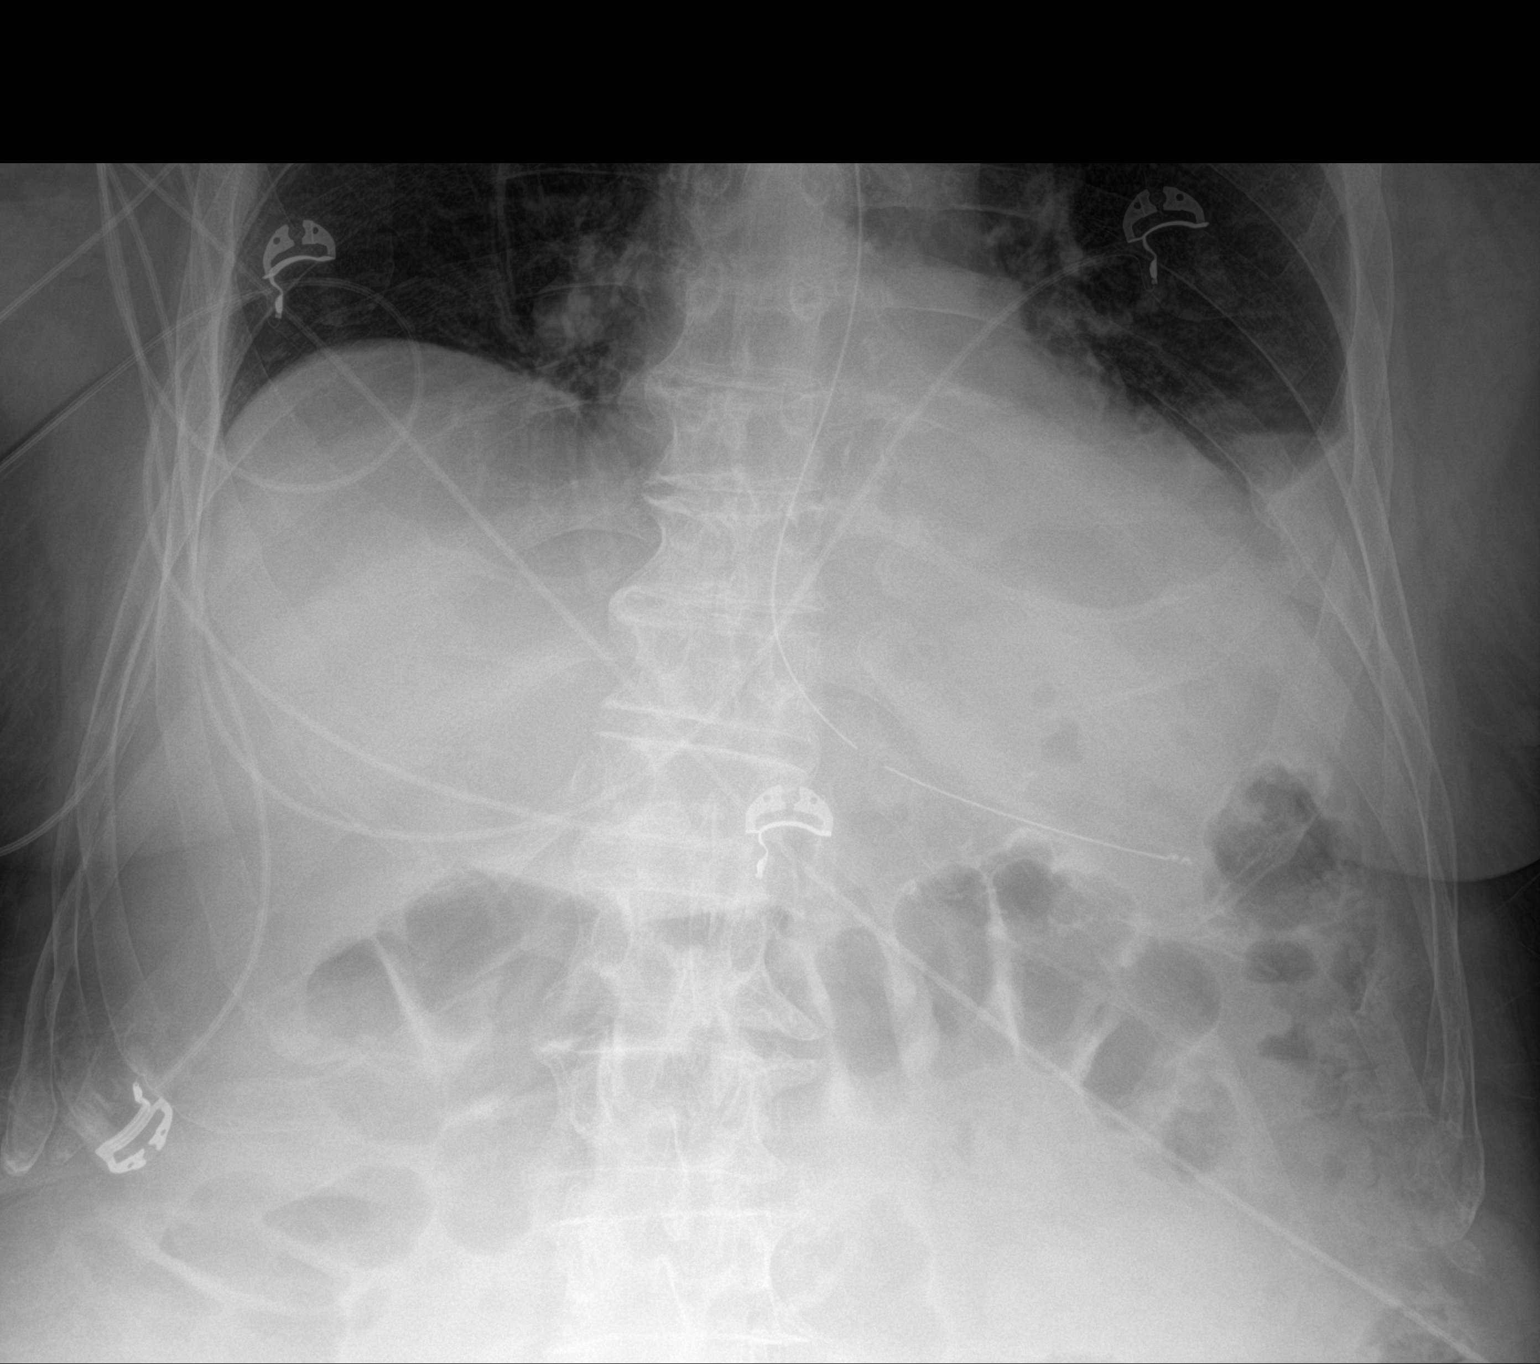

[1 of 1 positions shown; findings below may reference images not displayed]

FINDINGS: Enteric tube terminates in the proximal stomach. No dilated small
bowel loops. Mild colonic gas and stool. No evidence of pneumatosis
or pneumoperitoneum. Probable small left pleural effusion with mild
left lung base dense opacity.
IMPRESSION: Enteric tube terminates in the proximal stomach.

Probable small left pleural effusion with dense mild left lung base
opacity favoring atelectasis.

## 2020-09-06 MED ORDER — TRAVASOL 10 % IV SOLN
INTRAVENOUS | Status: AC
  Filled 2020-09-06: qty 1392

## 2020-09-06 NOTE — Progress Notes (Addendum)
PROGRESS NOTE  William Norman [id number] DOB: 09-Jan-1941 DOA: 08/28/2020 PCP: Vivi Barrack, MD  HPI/Recap of past 24 hours: 6 this is an 80 year old male with medical history significant for atrial fibrillation on Eliquis, coronary artery disease, history of CVA,, hypertension, perforated bowel in 2010 colostomy status post colostomy reversal in 2011 and small bowel obstruction 2016 that resolved with conservative management.  He now presents with abdominal pain and was admitted on August 28, 2020.  He was originally admitted to ICU   September 05, 2020: Patient seen and examined at bedside still complaining of pain but he stated as long as as he is lying quietly his pain is controlled but when he coughs or moves he has a severe sharp pain and at that time his medicine is not helping Patient denies any passage of gas or bowel movement yet  September 06, 2020 update: Patient seen and examined at bedside he is doing much better his pain is improved  Assessment/Plan: Principal Problem:   SBO (small bowel obstruction) ([place]) Active Problems:   Essential hypertension   Personal history of DVT (deep vein thrombosis)   Persistent atrial fibrillation (HCC)   CAD (coronary artery disease)   History of ischemic stroke   Recurrent UTI   Protein-calorie malnutrition, severe  1.  Small bowel obstruction status post exploratory laparotomy/resection Abdominal pain is slightly more tolerable Patient is followed by general surgery Continue TPN Continue NG tube with intermittent suction  2.  Atrial fibrillation rate controlled Eliquis is on hold due to operation Continue IV heparin  3.  Urinary tract infection prior to admission Patient was started on ceftriaxone and he has had 3 doses  4.  History of CVA history of coronary artery disease Statins beta-blocker and Eliquis on hold due to medical condition  Code Status: Full  Severity of Illness: The appropriate patient status for this  patient is INPATIENT. Inpatient status is judged to be reasonable and necessary in order to provide the required intensity of service to ensure the patient's safety. The patient's presenting symptoms, physical exam findings, and initial radiographic and laboratory data in the context of their chronic comorbidities is felt to place them at high risk for further clinical deterioration. Furthermore, it is not anticipated that the patient will be medically stable for discharge from the hospital within 2 midnights of admission. The following factors support the patient status of inpatient.   " Small bowel obstruction NG tube n.p.o.   * I certify that at the point of admission it is my clinical judgment that the patient will require inpatient hospital care spanning beyond 2 midnights from the point of admission due to high intensity of service, high risk for further deterioration and high frequency of surveillance required.*   Family Communication: None at bedside  Disposition Plan:   Status is: Inpatient   Dispo: The patient is from: Home              Anticipated d/c is to:               Anticipated d/c date is:               Patient currently not medically stable for discharge  Consultants: General surgery  Procedures: None  Antimicrobials: Ceftriaxone x3 days completed  DVT prophylaxis: Heparin   Objective: Vitals:   09/05/20 1957 09/05/20 2020 09/06/20 0350 09/06/20 1035  BP:  (!) 152/97 (!) 163/86 121/79  Pulse:  (!) 102 84 (!) 105  Resp:  $'17 16 20  'F$ Temp: 98 F (36.7 C) 99.4 F (37.4 C) 98.8 F (37.1 C) 98.1 F (36.7 C)  TempSrc: Oral Oral Oral Oral  SpO2: 97% 96% 96% 95%  Weight:      Height:        Intake/Output Summary (Last 24 hours) at 09/06/2020 1254 Last data filed at 09/06/2020 1036 Gross per 24 hour  Intake 3767.09 ml  Output 1550 ml  Net 2217.09 ml    Filed Weights   08/29/20 0100 08/30/20 0501 09/01/20 0457  Weight: 97.1 kg 99.1 kg 96.5 kg   Body  mass index is 28.85 kg/m.  Exam:  General: 80 y.o. year-old male well developed chronically ill looking in no acute distress.  Alert and oriented x3.  NG tube is in place and draining patient looks a little more comfortable Cardiovascular: Regular rate and rhythm with no rubs or gallops.  No thyromegaly or JVD noted.   Respiratory: Clear to auscultation with no wheezes or rales. Good inspiratory effort. Abdomen: Soft, loss tender to light palpation, slightly less distended with scant to no bowel sounds  x4 quadrants.  Wound VAC draining Musculoskeletal: No lower extremity edema. 2/4 pulses in all 4 extremities. Skin: No ulcerative lesions noted or rashes, Psychiatry: Mood is appropriate for condition and setting Neurology: No deficits    Data Reviewed: CBC: Recent Labs  Lab 09/01/20 0358 09/02/20 0508 09/03/20 0500 09/04/20 0300 09/06/20 1051  WBC 10.7* 7.9 8.9 12.5* 15.3*  NEUTROABS  --   --  6.9  --   --   HGB 16.8 16.0 14.3 14.2 12.7*  HCT 51.7 48.2 43.3 43.0 39.4  MCV 90.9 89.8 93.1 92.7 93.1  PLT 202 191 131* 145* [id number]    Basic Metabolic Panel: Recent Labs  Lab 09/02/20 0508 09/02/20 1938 09/03/20 0641 09/04/20 0300 09/05/20 0630 09/06/20 1051  NA 143  --  144 142 141 139  K 3.8  --  4.9 3.1* 3.4* 4.1  CL 105  --  111 105 107 109  CO2 17*  --  '28 31 28 23  '$ GLUCOSE 90  --  162* 125* 106* 125*  BUN 34*  --  25* 20 23 35*  CREATININE 1.09  --  0.84 0.76 0.74 1.36*  CALCIUM 9.4  --  8.1* 8.7* 8.8* 9.2  MG  --  1.9 1.9 2.1 2.0 2.1  PHOS  --  4.2 2.4* 2.6 2.2* 3.7    GFR: Estimated Creatinine Clearance: 52.2 mL/min (A) (by C-G formula based on SCr of 1.36 mg/dL (H)). Liver Function Tests: Recent Labs  Lab 09/03/20 0641  AST 32  ALT 24  ALKPHOS 30*  BILITOT 1.0  PROT 4.8*  ALBUMIN 2.5*    No results for input(s): LIPASE, AMYLASE in the last 168 hours. No results for input(s): AMMONIA in the last 168 hours. Coagulation Profile: No results for  input(s): INR, PROTIME in the last 168 hours. Cardiac Enzymes: No results for input(s): CKTOTAL, CKMB, CKMBINDEX, TROPONINI in the last 168 hours. BNP (last 3 results) No results for input(s): PROBNP in the last 8760 hours. HbA1C: No results for input(s): HGBA1C in the last 72 hours. CBG: Recent Labs  Lab 09/05/20 1602 09/05/20 2017 09/06/20 0348 09/06/20 0745 09/06/20 1213  GLUCAP 138* 167* 158* 122* 142*    Lipid Profile: Recent Labs    09/05/20 0630  TRIG 88    Thyroid Function Tests: No results for input(s): TSH, T4TOTAL, FREET4, T3FREE, THYROIDAB in the last 72 hours.  Anemia Panel: No results for input(s): VITAMINB12, FOLATE, FERRITIN, TIBC, IRON, RETICCTPCT in the last 72 hours. Urine analysis:    Component Value Date/Time   COLORURINE YELLOW 08/28/2020 1931   APPEARANCEUR HAZY (A) 08/28/2020 1931   LABSPEC 1.021 08/28/2020 1931   PHURINE 6.0 08/28/2020 1931   GLUCOSEU NEGATIVE 08/28/2020 1931   HGBUR LARGE (A) 08/28/2020 1931   HGBUR 1+ 02/13/2007 0000   BILIRUBINUR NEGATIVE 08/28/2020 1931   BILIRUBINUR negative 09/23/2019 1527   BILIRUBINUR n 10/14/2014 1012   KETONESUR NEGATIVE 08/28/2020 1931   PROTEINUR TRACE (A) 08/28/2020 1931   UROBILINOGEN 0.2 09/23/2019 1527   UROBILINOGEN 0.2 03/21/2014 0420   NITRITE POSITIVE (A) 08/28/2020 1931   LEUKOCYTESUR LARGE (A) 08/28/2020 1931   Sepsis Labs: '@LABRCNTIP'$ (procalcitonin:4,lacticidven:4)  ) Recent Results (from the past 240 hour(s))  Culture, blood (Routine X 2) w Reflex to ID Panel     Status: None   Collection Time: 08/28/20 10:50 PM   Specimen: BLOOD  Result Value Ref Range Status   Specimen Description BLOOD LEFT ANTECUBITAL  Final   Special Requests   Final    BOTTLES DRAWN AEROBIC AND ANAEROBIC Blood Culture adequate volume   Culture   Final    NO GROWTH 5 DAYS Performed at [facility], [place] [address], [place], [place] [address]    Report Status 09/03/2020 FINAL  Final  Culture,  blood (Routine X 2) w Reflex to ID Panel     Status: None   Collection Time: 08/28/20 11:00 PM   Specimen: BLOOD  Result Value Ref Range Status   Specimen Description BLOOD FEMORAL ARTERY RIGHT  Final   Special Requests   Final    BOTTLES DRAWN AEROBIC AND ANAEROBIC Blood Culture adequate volume   Culture   Final    NO GROWTH 5 DAYS Performed at [facility], [place] [address] [place], [place] [address]    Report Status 09/03/2020 FINAL  Final  Urine Culture     Status: Abnormal   Collection Time: 08/28/20 11:22 PM   Specimen: Urine, Random  Result Value Ref Range Status   Specimen Description   Final    URINE, RANDOM Performed at [facility], [address], [place], [place] [address]    Special Requests   Final    NONE Performed at [facility], [address], [place], [place] [address]    Culture MULTIPLE SPECIES PRESENT, SUGGEST RECOLLECTION (A)  Final   Report Status 08/30/2020 FINAL  Final  Resp Panel by RT-PCR (Flu A&B, Covid)     Status: None   Collection Time: 08/28/20 11:22 PM   Specimen: Nasopharyngeal(NP) swabs in vial transport medium  Result Value Ref Range Status   SARS Coronavirus 2 by RT PCR NEGATIVE NEGATIVE Final    Comment: (NOTE) SARS-CoV-2 target nucleic acids are NOT DETECTED.  The SARS-CoV-2 RNA is generally detectable in upper respiratory specimens during the acute phase of infection. The lowest concentration of SARS-CoV-2 viral copies this assay can detect is 138 copies/mL. A negative result does not preclude SARS-Cov-2 infection and should not be used as the sole basis for treatment or other patient management decisions. A negative result may occur with  improper specimen collection/handling, submission of specimen other than nasopharyngeal swab, presence of viral mutation(s) within the areas targeted by this assay, and inadequate number of viral copies(<138 copies/mL). A negative result must be  combined with clinical observations, patient history, and epidemiological information. The expected result is Negative.  Fact Sheet for Patients:  EntrepreneurPulse.com.au  Fact Sheet for Healthcare Providers:  IncredibleEmployment.be  This test is no t yet approved or cleared by the [place] FDA and  has been authorized for detection and/or diagnosis of SARS-CoV-2 by FDA under an Emergency Use Authorization (EUA). This EUA will remain  in effect (meaning this test can be used) for the duration of the COVID-19 declaration under Section 564(b)(1) of the Act, 21 U.S.C.section 360bbb-3(b)(1), unless the authorization is terminated  or revoked sooner.       Influenza A by PCR NEGATIVE NEGATIVE Final   Influenza B by PCR NEGATIVE NEGATIVE Final    Comment: (NOTE) The Xpert Xpress SARS-CoV-2/FLU/RSV plus assay is intended as an aid in the diagnosis of influenza from Nasopharyngeal swab specimens and should not be used as a sole basis for treatment. Nasal washings and aspirates are unacceptable for Xpert Xpress SARS-CoV-2/FLU/RSV testing.  Fact Sheet for Patients: EntrepreneurPulse.com.au  Fact Sheet for Healthcare Providers: IncredibleEmployment.be  This test is not yet approved or cleared by the [place] FDA and has been authorized for detection and/or diagnosis of SARS-CoV-2 by FDA under an Emergency Use Authorization (EUA). This EUA will remain in effect (meaning this test can be used) for the duration of the COVID-19 declaration under Section 564(b)(1) of the Act, 21 U.S.C. section 360bbb-3(b)(1), unless the authorization is terminated or revoked.  Performed at [facility], [address] [place], [place] [address]   Culture, blood (routine x 2)     Status: None   Collection Time: 08/29/20  2:15 AM   Specimen: BLOOD  Result Value Ref Range Status   Specimen Description  BLOOD RIGHT ANTECUBITAL  Final   Special Requests   Final    BOTTLES DRAWN AEROBIC AND ANAEROBIC Blood Culture adequate volume   Culture   Final    NO GROWTH 5 DAYS Performed at [facility], [address], [place], [place] [address]    Report Status 09/03/2020 FINAL  Final  Culture, blood (routine x 2)     Status: None   Collection Time: 08/29/20  2:22 AM   Specimen: BLOOD LEFT HAND  Result Value Ref Range Status   Specimen Description BLOOD LEFT HAND  Final   Special Requests   Final    BOTTLES DRAWN AEROBIC AND ANAEROBIC Blood Culture adequate volume   Culture   Final    NO GROWTH 5 DAYS Performed at [facility], [place] [address] [place], [place] [address]    Report Status 09/03/2020 FINAL  Final  Surgical pcr screen     Status: None   Collection Time: 09/01/20  8:09 AM   Specimen: Nasal Mucosa; Nasal Swab  Result Value Ref Range Status   MRSA, PCR NEGATIVE NEGATIVE Final   Staphylococcus aureus NEGATIVE NEGATIVE Final    Comment: (NOTE) The Xpert SA Assay (FDA approved for NASAL specimens in patients 15 years of age and older), is one component of a comprehensive surveillance program. It is not intended to diagnose infection nor to guide or monitor treatment. Performed at [facility], [employer] [employer] [address] [place], [place] [address]       Studies: DG Abd Portable 1V  Result Date: 09/06/2020 CLINICAL DATA:  NG tube placement EXAM: PORTABLE ABDOMEN - 1 VIEW COMPARISON:  09/02/2020 abdominal radiograph FINDINGS: Enteric tube terminates in the proximal stomach. No dilated small bowel loops. Mild colonic gas and stool. No evidence of pneumatosis or pneumoperitoneum. Probable small left pleural  effusion with mild left lung base dense opacity. IMPRESSION: Enteric tube terminates in the proximal stomach. Probable small left pleural effusion with dense mild left lung base opacity favoring atelectasis. Electronically Signed   By: Ilona Sorrel M.D.   On: 09/06/2020  09:08    Scheduled Meds:  Chlorhexidine Gluconate Cloth  6 each Topical Daily   insulin aspart  0-15 Units Subcutaneous Q4H   lidocaine  1 patch Transdermal Q24H   pantoprazole (PROTONIX) IV  40 mg Intravenous Q24H   sodium chloride flush  10-40 mL Intracatheter Q12H    Continuous Infusions:  sodium chloride 10 mL/hr at 09/02/20 0645   heparin 1,850 Units/hr (09/06/20 0439)   methocarbamol (ROBAXIN) IV 1,000 mg (09/06/20 1253)   piperacillin-tazobactam (ZOSYN)  IV 3.375 g (09/06/20 0536)   TPN ADULT (ION) 100 mL/hr at 09/05/20 1904   TPN ADULT (ION)       LOS: 8 days     Cristal Deer, MD [facility]  To reach me or the doctor on call, go to: [email or web address] Password [facility]  09/06/2020, 12:54 PM

## 2020-09-06 NOTE — Progress Notes (Signed)
Non-latex Foley catheter placed, 60 French with a 10 cc balloon syringe per MD Kisinger order. Bag below bed level, tubing secured with leg strap. Patient tolerated well, will continue to monitor.

## 2020-09-06 NOTE — Progress Notes (Signed)
Progress Note  4 Days Post-Op  Subjective: Little more pain this morning, no bowel function yet although he feels like he may have something later today.  NG tube was reinserted overnight, unfortunately with a 12 [place] caliber tube.  Objective: Vital signs in last 24 hours: Temp:  [98 F (36.7 C)-99.4 F (37.4 C)] 98.8 F (37.1 C) (07/24 0350) Pulse Rate:  [84-102] 84 (07/24 0350) Resp:  [16-17] 16 (07/24 0350) BP: (149-180)/(83-97) 163/86 (07/24 0350) SpO2:  [96 %-98 %] 96 % (07/24 0350) Last BM Date: 08/28/20  Intake/Output from previous day: 07/23 0701 - 07/24 0700 In: 3767.1 [I.V.:2630.9; IV Piggyback:1136.2] Out: 1550 [Urine:1100; Emesis/NG output:400; Drains:50] Intake/Output this shift: No intake/output data recorded.  PE: General: pleasant, WD, elderly male who is laying in bed in NAD Heart: RRR, Palpable radial and pedal pulses bilaterally Lungs: CTAB, no wheezes, rhonchi, or rales noted.  Respiratory effort nonlabored Abd: soft, appropriately ttp, mildly distended.  Midline wound vac with serosanguinous output, NGT with bilious drainage GU: condom cath present with clear yellow urine  Psych: A&Ox3 with an appropriate affect.   Lab Results:  Recent Labs    09/04/20 0300  WBC 12.5*  HGB 14.2  HCT 43.0  PLT 145*    BMET Recent Labs    09/04/20 0300 09/05/20 0630  NA 142 141  K 3.1* 3.4*  CL 105 107  CO2 31 28  GLUCOSE 125* 106*  BUN 20 23  CREATININE 0.76 0.74  CALCIUM 8.7* 8.8*    PT/INR No results for input(s): LABPROT, INR in the last 72 hours. CMP     Component Value Date/Time   NA 141 09/05/2020 0630   NA 141 12/03/2019 1509   K 3.4 (L) 09/05/2020 0630   CL 107 09/05/2020 0630   CO2 28 09/05/2020 0630   GLUCOSE 106 (H) 09/05/2020 0630   GLUCOSE 94 01/10/2006 1108   BUN 23 09/05/2020 0630   BUN 10 12/03/2019 1509   CREATININE 0.74 09/05/2020 0630   CREATININE 0.84 09/30/2019 1349   CALCIUM 8.8 (L) 09/05/2020 0630   PROT 4.8  (L) 09/03/2020 0641   PROT 6.5 12/03/2019 1509   ALBUMIN 2.5 (L) 09/03/2020 0641   ALBUMIN 4.2 12/03/2019 1509   AST 32 09/03/2020 0641   ALT 24 09/03/2020 0641   ALKPHOS 30 (L) 09/03/2020 0641   BILITOT 1.0 09/03/2020 0641   BILITOT 0.9 12/03/2019 1509   GFRNONAA >60 09/05/2020 0630   GFRAA 97 12/03/2019 1509   Lipase     Component Value Date/Time   LIPASE 15 08/28/2020 1931       Studies/Results: DG Abd Portable 1V  Result Date: 09/06/2020 CLINICAL DATA:  NG tube placement EXAM: PORTABLE ABDOMEN - 1 VIEW COMPARISON:  09/02/2020 abdominal radiograph FINDINGS: Enteric tube terminates in the proximal stomach. No dilated small bowel loops. Mild colonic gas and stool. No evidence of pneumatosis or pneumoperitoneum. Probable small left pleural effusion with mild left lung base dense opacity. IMPRESSION: Enteric tube terminates in the proximal stomach. Probable small left pleural effusion with dense mild left lung base opacity favoring atelectasis. Electronically Signed   By: Ilona Sorrel M.D.   On: 09/06/2020 09:08    Anti-infectives: Anti-infectives (From admission, onward)    Start     Dose/Rate Route Frequency Ordered Stop   09/02/20 1845  piperacillin-tazobactam (ZOSYN) IVPB 3.375 g        3.375 g 12.5 mL/hr over 240 Minutes Intravenous Every 8 hours 09/02/20 1819 09/07/20 1359  09/02/20 1215  ceFAZolin (ANCEF) IVPB 2g/100 mL premix        2 g 200 mL/hr over 30 Minutes Intravenous  Once 09/02/20 1115 09/02/20 1320   08/29/20 0600  cefTRIAXone (ROCEPHIN) 1 g in sodium chloride 0.9 % 100 mL IVPB        1 g 200 mL/hr over 30 Minutes Intravenous Every 24 hours 08/29/20 0204 08/31/20 0730   08/28/20 2300  piperacillin-tazobactam (ZOSYN) IVPB 3.375 g        3.375 g 100 mL/hr over 30 Minutes Intravenous  Once 08/28/20 2245 08/28/20 2342        Assessment/Plan Closed loop bowel obstruction POD4 S/P exploratory laparotomy, extensive LOA, SBR x1 09/02/20 Dr. Georgette Dover - patient  with expected post-op ileus at this time - continue NGT on LIWS, continue TPN - goal K >4.0 and Mg >2.0 to help optimize bowel function (labs pending) - mobilize as tolerated, PT/OT  - VAC m/w/f - as of 7/22 wound measures 19.5 cm x 3.5 cm x 3 cm - continue IV abx 5 days post-op for spillage of bowel contents Hypokalemia -labs not available yet for the day   FEN - NPO, NGT to LIWS, IVF @ 50 cc/h, TPN VTE - SCDs, heparin gtt  ID - Zosyn for 5 days post-op   A. Fib on Eliquis - on hold  History of CVA January 2022 UTI - competed course of rocephin  Hypertension Hyperlipidemia  LOS: 8 days    Clovis Riley, [facility] 09/06/2020, 10:02 AM Please see Amion for pager number during day hours 7:00am-4:30pm

## 2020-09-06 NOTE — Progress Notes (Signed)
[place] for apixaban>> heparin Indication: atrial fibrillation and bridging while NPO for SBO  Allergies  Allergen Reactions   Other Other (See Comments)    Some form of numbing medication used by dentist, almost died   Simvastatin Other (See Comments)    Muscle aches     Patient Measurements: Height: 6' (182.9 cm) Weight: 96.5 kg (212 lb 11.9 oz) IBW/kg (Calculated) : 77.6 Heparin Dosing Weight: 97.4 kg   Vital Signs: Temp: 98.8 F (37.1 C) (07/24 0350) Temp Source: Oral (07/24 0350) BP: 163/86 (07/24 0350) Pulse Rate: 84 (07/24 0350)  Labs: Recent Labs    09/04/20 0300 09/04/20 0719 09/04/20 2000 09/05/20 0630 09/05/20 1857  HGB 14.2  --   --   --   --   HCT 43.0  --   --   --   --   PLT 145*  --   --   --   --   HEPARINUNFRC  --    < > 0.10* 0.19* 0.39  CREATININE 0.76  --   --  0.74  --    < > = values in this interval not displayed.     Estimated Creatinine Clearance: 88.8 mL/min (by C-G formula based on SCr of 0.74 mg/dL).   Assessment: 25 yof with known hx of Afib presented with ab pain - now found to have SBO and is NPO, requiring parenteral anticoagulation for atrial fibrillation (CHADSVASC 6). Last dose apixaban on 7/15'@1200'$ . Patient s/p ex-lap with SBO resection. Heparin held post operative per surgery. Pharmacy consulted to resume heparin with no bolus.   Heparin level: 0.59 units/mL, therapeutic  Therapeutic levels x 2  No issues noted or s/s of bleeding.  Goal of Therapy:  Heparin level 0.3-0.7 units/ml Monitor platelets by anticoagulation protocol: Yes   Plan:  - Continue Heparin at 1850 units/hr  -Daily CBC and heparin level - Will continue to monitor for any s/s of bleeding   Thank you for allowing pharmacy to participate in this patient's care.  Levonne Spiller, PharmD PGY1 Acute Care Resident  09/06/2020,8:19 AM

## 2020-09-06 NOTE — Progress Notes (Addendum)
Patient is not urinating, bladder scan showed 412. MD Yevonne Pax notified via secure chat and cone on call physician paged via [place]. Will continue to monitor and follow MD orders.

## 2020-09-06 NOTE — Progress Notes (Signed)
PHARMACY - TOTAL PARENTERAL NUTRITION CONSULT NOTE  Indication: SBO  Patient Measurements: Height: 6' (182.9 cm) Weight: 96.5 kg (212 lb 11.9 oz) IBW/kg (Calculated) : 77.6 TPN AdjBW (KG): 82.8 Body mass index is 28.85 kg/m. Usual Weight: 97-100 kg  Assessment:  79 YOM presented on 7/15 with acute abdominal pain.  PMH significant for GERD, bowel perforation in 2010, colostomy reversal in 2011 and SBO in 2016.  CT showed possible closed loop.  Initially treated with conservative management, but abdominal xray shows progressive SB distention consistent with SBO.  Pharmacy consulted to TPN management given likely surgery.  Patient denies recent weight loss and reports eating 2 meals a day.  He usually eats fruits for breakfast, fast food for lunch ([employer]aco [employer], McDonald's, etc) and 1-2 meals per week for supper, which is usually a sandwich.    Glucose / Insulin: no hx DM. CBGs 122-167. Utilized 14 units/24 hrs. - A1c 5.9 in June 2022 Electrolytes: NA 139, K 4.1 (s/p Kcl 10 mEq IV x4 + K phos 20 mmol x1), Phos 3.7 (s/p K phos 20 mmol x1), CoCa 10.4 Renal: SCr stable- appears at baseline. BUN wnl. Hepatic: LFTs WNL. TG 255 after receiving Propofol bolus in OR. Repeat TG 88. Albumin 2.5. Prealbumin 14.5 Intake / Output; MIVF: UOP 0.5 ml/kg/hr + 3 occurrences, NG 400 ml. GI Imaging:  - 7/24 Abd x-ray: ordered  GI Surgeries / Procedures:   - 7/20 ex-lap: SBO with closed loop obstruction in LUQ, small bowel resection (~40 cm), lysis of adhesions  Central access: 09/02/20 TPN start date: 09/02/20  Nutritional Goals (RD recommendation 7/21): 2200-2400 kCal, 125-155g AA per day, fluid >/= 2.2 L  Current Nutrition:  NPO TPN - goal rate 186m/hr to prove 139g AA and 2314 kcal   Plan:  Continue TPN at 100 ml/hr - this will provide 139 g AA and 2314 kcal meeting 100% of needs  Adjust SMOF to be 30% total kcal due to repeat TG wnl Electrolytes in TPN: remove Ca. Continue Na 546m/L, K 50 mEq/L,  Mg 7.5 mEq/L, Phos 25 mmol/L. Continue ClJW:[id number]. Add standard MVI and trace elements to TPN Continue moderate SSI q4h Standard TPN labs Monday/Thursday Thiamine '100mg'$  x7 days in TPN per RD recommendation (ends 7/27)  GrCristela FeltPharmD, BCPS Clinical Pharmacist  09/06/2020, 8:13 AM

## 2020-09-06 NOTE — Progress Notes (Signed)
Came into patients' room and found a small puddle of TPN on the floor with his IV tubing disconnected and dangling from the pole. Turned channel off and contacted IV team at 1524. Jannette from IV team called and informed me that they will come and change bag and tubing at 1700.

## 2020-09-06 NOTE — Plan of Care (Signed)
  Problem: Education: Goal: Knowledge of General Education information will improve Description: Including pain rating scale, medication(s)/side effects and non-pharmacologic comfort measures Outcome: Progressing   Problem: Clinical Measurements: Goal: Ability to maintain clinical measurements within normal limits will improve Outcome: Progressing Goal: Will remain free from infection Outcome: Progressing Goal: Diagnostic test results will improve Outcome: Progressing Goal: Respiratory complications will improve Outcome: Progressing Goal: Cardiovascular complication will be avoided Outcome: Progressing   Problem: Nutrition: Goal: Adequate nutrition will be maintained Outcome: Progressing   Problem: Elimination: Goal: Will not experience complications related to bowel motility Outcome: Progressing Goal: Will not experience complications related to urinary retention Outcome: Progressing   Problem: Pain Managment: Goal: General experience of comfort will improve Outcome: Progressing   Problem: Safety: Goal: Ability to remain free from injury will improve Outcome: Progressing   Problem: Education: Goal: Required Educational Video(s) Outcome: Progressing   Problem: Clinical Measurements: Goal: Ability to maintain clinical measurements within normal limits will improve Outcome: Progressing Goal: Postoperative complications will be avoided or minimized Outcome: Progressing   Problem: Skin Integrity: Goal: Demonstration of wound healing without infection will improve Outcome: Progressing

## 2020-09-06 NOTE — Progress Notes (Addendum)
Pt pulled NG tube out.Notified on call X. Blount,NP.Received order to replace it back. NG tube  (12 Fr) placed to left nare. Awaiting for xray to verify placement. Also pt has not voided since last night bladder scan volume-539m. Notified on call X. Blount,NP and received  order to do in and out cath. In and out cath done with approx 9017murine output.

## 2020-09-07 DIAGNOSIS — K56609 Unspecified intestinal obstruction, unspecified as to partial versus complete obstruction: Secondary | ICD-10-CM | POA: Diagnosis not present

## 2020-09-07 LAB — COMPREHENSIVE METABOLIC PANEL
ALT: 33 U/L (ref 0–44)
AST: 39 U/L (ref 15–41)
Albumin: 1.8 g/dL — ABNORMAL LOW (ref 3.5–5.0)
Alkaline Phosphatase: 58 U/L (ref 38–126)
Anion gap: 6 (ref 5–15)
BUN: 35 mg/dL — ABNORMAL HIGH (ref 8–23)
CO2: 24 mmol/L (ref 22–32)
Calcium: 8.8 mg/dL — ABNORMAL LOW (ref 8.9–10.3)
Chloride: 111 mmol/L (ref 98–111)
Creatinine, Ser: 0.98 mg/dL (ref 0.61–1.24)
GFR, Estimated: >60 mL/min (ref >60)
Glucose, Bld: 108 mg/dL — ABNORMAL HIGH (ref 70–99)
Potassium: 4.2 mmol/L (ref 3.5–5.1)
Sodium: 141 mmol/L (ref 135–145)
Total Bilirubin: 0.8 mg/dL (ref 0.3–1.2)
Total Protein: 4.6 g/dL — ABNORMAL LOW (ref 6.5–8.1)

## 2020-09-07 LAB — CBC
HCT: 35.5 % — ABNORMAL LOW (ref 39.0–52.0)
Hemoglobin: 11.2 g/dL — ABNORMAL LOW (ref 13.0–17.0)
MCH: 29.8 pg (ref 26.0–34.0)
MCHC: 31.5 g/dL (ref 30.0–36.0)
MCV: 94.4 fL (ref 80.0–100.0)
Platelets: 220 10*3/uL (ref 150–400)
RBC: 3.76 MIL/uL — ABNORMAL LOW (ref 4.22–5.81)
RDW: 14.8 % (ref 11.5–15.5)
WBC: 14.2 10*3/uL — ABNORMAL HIGH (ref 4.0–10.5)
nRBC: 0 % (ref 0.0–0.2)

## 2020-09-07 LAB — GLUCOSE, CAPILLARY
Glucose-Capillary: 119 mg/dL — ABNORMAL HIGH (ref 70–99)
Glucose-Capillary: 129 mg/dL — ABNORMAL HIGH (ref 70–99)
Glucose-Capillary: 143 mg/dL — ABNORMAL HIGH (ref 70–99)
Glucose-Capillary: 128 mg/dL — ABNORMAL HIGH (ref 70–99)
Glucose-Capillary: 143 mg/dL — ABNORMAL HIGH (ref 70–99)
Glucose-Capillary: 114 mg/dL — ABNORMAL HIGH (ref 70–99)
Glucose-Capillary: 117 mg/dL — ABNORMAL HIGH (ref 70–99)

## 2020-09-07 LAB — DIFFERENTIAL
Abs Immature Granulocytes: 0 10*3/uL (ref 0.00–0.07)
Basophils Absolute: 0 10*3/uL (ref 0.0–0.1)
Basophils Relative: 0 %
Eosinophils Absolute: 0.4 10*3/uL (ref 0.0–0.5)
Eosinophils Relative: 3 %
Lymphocytes Relative: 11 %
Lymphs Abs: 1.6 10*3/uL (ref 0.7–4.0)
Monocytes Absolute: 1 10*3/uL (ref 0.1–1.0)
Monocytes Relative: 7 %
Neutro Abs: 11.2 10*3/uL — ABNORMAL HIGH (ref 1.7–7.7)
Neutrophils Relative %: 79 %
nRBC: 0 /100 WBC

## 2020-09-07 LAB — PHOSPHORUS: Phosphorus: 2.9 mg/dL (ref 2.5–4.6)

## 2020-09-07 LAB — TRIGLYCERIDES: Triglycerides: 74 mg/dL (ref <150)

## 2020-09-07 LAB — MAGNESIUM: Magnesium: 2.1 mg/dL (ref 1.7–2.4)

## 2020-09-07 LAB — PREALBUMIN: Prealbumin: 8.7 mg/dL — ABNORMAL LOW (ref 18–38)

## 2020-09-07 LAB — HEPARIN LEVEL (UNFRACTIONATED): Heparin Unfractionated: 0.51 IU/mL (ref 0.30–0.70)

## 2020-09-07 MED ORDER — TRAVASOL 10 % IV SOLN
INTRAVENOUS | Status: AC
  Filled 2020-09-07: qty 1392

## 2020-09-07 NOTE — Progress Notes (Signed)
Progress Note  5 Days Post-Op  Subjective: Feeling ok today.  Feels a little bloated.  Passed a small amount of flatus yesterday, but no BM.  900cc from NGT yesterday and 500cc today  Objective: Vital signs in last 24 hours: Temp:  [98.1 F (36.7 C)-98.8 F (37.1 C)] 98.2 F (36.8 C) (07/25 0812) Pulse Rate:  [90-116] 107 (07/25 0812) Resp:  [16-19] 19 (07/25 0812) BP: (128-162)/(78-85) 129/81 (07/25 0812) SpO2:  [95 %-98 %] 95 % (07/25 0812) Last BM Date: 08/28/20  Intake/Output from previous day: 07/24 0701 - 07/25 0700 In: 2198.7 [I.V.:1736.6; IV Piggyback:462.2] Out: 2700 [Urine:1800; Emesis/NG output:900] Intake/Output this shift: Total I/O In: -  Out: 500 [Emesis/NG output:500]  PE: Abd: soft, mild distention, but with a few BS, NGT with 500cc of bilious output just emptied.  Midline incision with wound VAC in place.   Lab Results:  Recent Labs    09/06/20 1051 09/07/20 0348  WBC 15.3* 14.2*  HGB 12.7* 11.2*  HCT 39.4 35.5*  PLT 219 220   BMET Recent Labs    09/06/20 1051 09/07/20 0348  NA 139 141  K 4.1 4.2  CL 109 111  CO2 23 24  GLUCOSE 125* 108*  BUN 35* 35*  CREATININE 1.36* 0.98  CALCIUM 9.2 8.8*   PT/INR No results for input(s): LABPROT, INR in the last 72 hours. CMP     Component Value Date/Time   NA 141 09/07/2020 0348   NA 141 12/03/2019 1509   K 4.2 09/07/2020 0348   CL 111 09/07/2020 0348   CO2 24 09/07/2020 0348   GLUCOSE 108 (H) 09/07/2020 0348   GLUCOSE 94 01/10/2006 1108   BUN 35 (H) 09/07/2020 0348   BUN 10 12/03/2019 1509   CREATININE 0.98 09/07/2020 0348   CREATININE 0.84 09/30/2019 1349   CALCIUM 8.8 (L) 09/07/2020 0348   PROT 4.6 (L) 09/07/2020 0348   PROT 6.5 12/03/2019 1509   ALBUMIN 1.8 (L) 09/07/2020 0348   ALBUMIN 4.2 12/03/2019 1509   AST 39 09/07/2020 0348   ALT 33 09/07/2020 0348   ALKPHOS 58 09/07/2020 0348   BILITOT 0.8 09/07/2020 0348   BILITOT 0.9 12/03/2019 1509   GFRNONAA >60 09/07/2020 0348    GFRAA 97 12/03/2019 1509   Lipase     Component Value Date/Time   LIPASE 15 08/28/2020 1931       Studies/Results: DG Abd Portable 1V  Result Date: 09/06/2020 CLINICAL DATA:  NG tube placement EXAM: PORTABLE ABDOMEN - 1 VIEW COMPARISON:  09/02/2020 abdominal radiograph FINDINGS: Enteric tube terminates in the proximal stomach. No dilated small bowel loops. Mild colonic gas and stool. No evidence of pneumatosis or pneumoperitoneum. Probable small left pleural effusion with mild left lung base dense opacity. IMPRESSION: Enteric tube terminates in the proximal stomach. Probable small left pleural effusion with dense mild left lung base opacity favoring atelectasis. Electronically Signed   By: Ilona Sorrel M.D.   On: 09/06/2020 09:08    Anti-infectives: Anti-infectives (From admission, onward)    Start     Dose/Rate Route Frequency Ordered Stop   09/02/20 1845  piperacillin-tazobactam (ZOSYN) IVPB 3.375 g        3.375 g 12.5 mL/hr over 240 Minutes Intravenous Every 8 hours 09/02/20 1819 09/07/20 1359   09/02/20 1215  ceFAZolin (ANCEF) IVPB 2g/100 mL premix        2 g 200 mL/hr over 30 Minutes Intravenous  Once 09/02/20 1115 09/02/20 1320   08/29/20 0600  cefTRIAXone (  ROCEPHIN) 1 g in sodium chloride 0.9 % 100 mL IVPB        1 g 200 mL/hr over 30 Minutes Intravenous Every 24 hours 08/29/20 0204 08/31/20 0730   08/28/20 2300  piperacillin-tazobactam (ZOSYN) IVPB 3.375 g        3.375 g 100 mL/hr over 30 Minutes Intravenous  Once 08/28/20 2245 08/28/20 2342        Assessment/Plan POD 5,  S/P exploratory laparotomy, extensive LOA, SBR x1 09/02/20 Dr. Georgette Dover for closed loop bowel obstruction - patient with expected post-op ileus at this time - continue NGT on LIWS, continue TPN - goal K >4.0 and Mg >2.0 to help optimize bowel function (labs pending) - mobilize as tolerated, PT/OT  - VAC m/w/f - as of 7/22 wound measures 19.5 cm x 3.5 cm x 3 cm - WBC 14.  Will follow Hypokalemia  -resolved   FEN - NPO, NGT to LIWS, IVF @ 50 cc/h, TPN VTE - SCDs, heparin gtt  ID - Zosyn for 5 days post-op   A. Fib on Eliquis - on hold  History of CVA January 2022 UTI - competed course of rocephin  Hypertension Hyperlipidemia  LOS: 9 days    Henreitta Cea, [facility] 09/07/2020, 10:42 AM Please see Amion for pager number during day hours 7:00am-4:30pm

## 2020-09-07 NOTE — Progress Notes (Signed)
Occupational Therapy Treatment Patient Details Name: William Norman MRN: [id number] DOB: 1940-07-13 Today's Date: 09/07/2020    History of present illness 80 y.o. male presenting with abdominal pain 08/28/20. +small bowel obstruction, rate-controlled afib; 09/02/20 S/P exploratory laparotomy, extensive LOA, SBR x1    PMH significant for atrial fibrillation on Eliquis, CAD, history of CVA, hypertension, perforated bowel in 2010, colostomy reversal in 2011, and SBO in 2016 that resolved with conservative management, now   OT comments  Pt making progress with functional goals. Pt in bed upon arrival and agreeable to OOB activity. Session focused on bed mobility to sit EOB, sit - stand and stand - sit transitions with RW, grooming tasks, simulated LB bathing seated, SPTs with RW. OT will continue to follow acutely to maximize level of function and safety  Follow Up Recommendations  No OT follow up;Supervision - Intermittent    Equipment Recommendations  Other (comment) (reacher, LH bath sponge)    Recommendations for Other Services      Precautions / Restrictions Precautions Precautions: Fall Precaution Comments: NG tube; abd VAC dressing, monitor HR Restrictions Weight Bearing Restrictions: No       Mobility Bed Mobility Overal bed mobility: Needs Assistance Bed Mobility: Rolling;Sidelying to Sit Rolling: Min assist Sidelying to sit: Min assist       General bed mobility comments: min A with initiating and maintaining position for rolling, elevation of trunk    Transfers Overall transfer level: Needs assistance Equipment used: Rolling walker (2 wheeled) Transfers: Sit to/from Stand Sit to Stand: Mod assist Stand pivot transfers: Min assist            Balance                                           ADL either performed or assessed with clinical judgement   ADL Overall ADL's : Needs assistance/impaired     Grooming: Wash/dry  hands;Wash/dry face;Standing       Lower Body Bathing: Moderate assistance Lower Body Bathing Details (indicate cue type and reason): simulated seated EOB         Toilet Transfer: Moderate assistance;Minimal assistance;RW;Stand-pivot;BSC;Cueing for safety;Cueing for sequencing   Toileting- Clothing Manipulation and Hygiene: Minimal assistance;Sit to/from stand       Functional mobility during ADLs: Moderate assistance;Minimal assistance;Rolling walker;Cueing for safety       Vision Patient Visual Report: No change from baseline     Perception     Praxis      Cognition Arousal/Alertness: Awake/alert Behavior During Therapy: WFL for tasks assessed/performed Overall Cognitive Status: Within Functional Limits for tasks assessed                                          Exercises     Shoulder Instructions       General Comments  Pt very pleasant and cooperative    Pertinent Vitals/ Pain       Pain Assessment: Faces Faces Pain Scale: Hurts even more Pain Location: abdomen Pain Descriptors / Indicators: Guarding;Grimacing;Moaning;Operative site guarding Pain Intervention(s): Limited activity within patient's tolerance;Monitored during session;Repositioned  Home Living  Prior Functioning/Environment              Frequency  Min 2X/week        Progress Toward Goals  OT Goals(current goals can now be found in the care plan section)  Progress towards OT goals: Progressing toward goals  Acute Rehab OT Goals Patient Stated Goal: be able to go home  Plan Discharge plan remains appropriate    Co-evaluation                 AM-PAC OT "6 Clicks" Daily Activity     Outcome Measure   Help from another person eating meals?: None Help from another person taking care of personal grooming?: A Little Help from another person toileting, which includes using toliet, bedpan, or  urinal?: A Little Help from another person bathing (including washing, rinsing, drying)?: A Lot Help from another person to put on and taking off regular upper body clothing?: A Little Help from another person to put on and taking off regular lower body clothing?: A Lot 6 Click Score: 17    End of Session Equipment Utilized During Treatment: Gait belt;Rolling walker;Other (comment) (BSC)  OT Visit Diagnosis: Unsteadiness on feet (R26.81);Muscle weakness (generalized) (M62.81);Pain Pain - part of body:  (abdomen)   Activity Tolerance No increased pain   Patient Left in chair;with call bell/phone within reach;with chair alarm set   Nurse Communication          Time: [id number] OT Time Calculation (min): 31 min  Charges: OT General Charges $OT Visit: 1 Visit OT Treatments $Self Care/Home Management : 8-22 mins $Therapeutic Activity: 8-22 mins     Britt Bottom 09/07/2020, 2:04 PM

## 2020-09-07 NOTE — Progress Notes (Signed)
PT Cancellation Note  Patient Details Name: William Norman MRN: [id number] DOB: Apr 25, 1940   Cancelled Treatment:    Reason Eval/Treat Not Completed: Pain limiting ability to participate;Fatigue/lethargy limiting ability to participate.  Pt was tired from OT and had wound vac changed, in pain and declining PT.   Will retry tomorrow.   Ramond Dial 09/07/2020, 2:55 PM  Mee Hives, PT MS Acute Rehab Dept. Number: [place]

## 2020-09-07 NOTE — Progress Notes (Signed)
ANTICOAGULATION CONSULT NOTE - Follow Up Consult  Pharmacy Consult for Heparin Indication: atrial fibrillation  Allergies  Allergen Reactions   Other Other (See Comments)    Some form of numbing medication used by dentist, almost died   Simvastatin Other (See Comments)    Muscle aches     Patient Measurements: Height: 6' (182.9 cm) Weight: 96.5 kg (212 lb 11.9 oz) IBW/kg (Calculated) : 77.6 Heparin Dosing Weight:   Vital Signs: Temp: 98.2 F (36.8 C) (07/25 0812) Temp Source: Oral (07/25 0812) BP: 129/81 (07/25 0812) Pulse Rate: 107 (07/25 0812)  Labs: Recent Labs    09/05/20 0630 09/05/20 1857 09/06/20 1051 09/07/20 0348  HGB  --   --  12.7* 11.2*  HCT  --   --  39.4 35.5*  PLT  --   --  219 220  HEPARINUNFRC 0.19* 0.39 0.59 0.51  CREATININE 0.74  --  1.36* 0.98    Estimated Creatinine Clearance: 72.4 mL/min (by C-G formula based on SCr of 0.98 mg/dL).   Assessment:  Anticoag: PTA apix - hx Afib (LD 7/15'@1200'$ ) >> heparin gtt while NPO with SBO. HL 0.51. Hgb down to 11.2 Plts 220 ok. - CHADSVASC 6   Goal of Therapy:  Heparin level 0.3-0.7 units/ml Monitor platelets by anticoagulation protocol: Yes   Plan:  Con't IV heparin at 1850 units/hr Daily HL and CBC   Zhi Geier S. Alford Highland, PharmD, BCPS Clinical Staff Pharmacist [email or web address]  Alford Highland, [place] 09/07/2020,9:00 AM

## 2020-09-07 NOTE — Progress Notes (Addendum)
PHARMACY - TOTAL PARENTERAL NUTRITION CONSULT NOTE  Indication: SBO  Patient Measurements: Height: 6' (182.9 cm) Weight: 96.5 kg (212 lb 11.9 oz) IBW/kg (Calculated) : 77.6 TPN AdjBW (KG): 82.8 Body mass index is 28.85 kg/m. Usual Weight: 97-100 kg  Assessment:  79 YOM presented on 7/15 with acute abdominal pain.  CT showed possible closed loop.  Initially treated with conservative management, but abdominal xray shows progressive SB distention consistent with SBO.  Pt denies recent weight loss. - PMH significant for GERD, bowel perforation in 2010, colostomy reversal in 2011 and SBO in 2016.   Glucose / Insulin: no hx DM. CBGs 119-142. SSI 8 units/24 hrs. - A1c 5.9 in June 2022 Electrolytes: K 4.2, CoCa: 10.6 slightly elevated, Mg/Phos WNL - [id number] product <55. Renal: SCr <1. Good UOP.  Hepatic: LFTs WNL. TG74. Albumin 1.8. Prealbumin 14.5>8.7 post-op  Intake / Output; MIVF: UOP 0.8 ml/kg/hr=1866m. NG 900 ml. LBM 7/15 GI: IV PPI/24h GI Imaging:  - 7/24 Abd x-ray: ordered  GI Surgeries / Procedures:   - 7/20 ex-lap: SBO with closed loop obstruction in LUQ, small bowel resection (~40 cm), lysis of adhesions  Central access: 09/02/20 TPN start date: 09/02/20  Nutritional Goals (RD recommendation 7/21): 2200-2400 kCal, 125-155g AA per day, fluid >/= 2.2 L  Current Nutrition:  NPO TPN - goal rate 1038mhr to prove 139g AA and 2314 kcal   Plan:  No change to TPN today. Continue TPN at 100 ml/hr - this will provide 139 g AA and 2313 kcal meeting 100% of needs  Electrolytes in TPN: removed Ca. Continue Na 5017mL, K 50 mEq/L, Mg 7.5 mEq/L, Phos 25 mmol/L. Continue Cl:[id number]. Add standard MVI and trace elements to TPN Continue moderate SSI q4h Standard TPN labs Monday/Thursday Thiamine '100mg'$  x7 days in TPN per RD recommendation (ends 7/27)  Ariston Grandison S. RobAlford HighlandharmD, BCPS Clinical Staff Pharmacist [email or web address] 09/07/2020, 8:47 AM

## 2020-09-07 NOTE — Progress Notes (Signed)
PROGRESS NOTE    William Norman  [id number] DOB: 1940/07/10 DOA: 08/28/2020 PCP: Vivi Barrack, MD   Chief Complain:Abdominal pain  Brief Narrative:  Patient is a 80 year old male with medical history significant for atrial fibrillation on Eliquis, coronary artery disease, history of CVA,, hypertension, perforated bowel in 2010 colostomy status post colostomy reversal in 2011 and small bowel obstruction 2016 that resolved with conservative management who presented with abdominal pain and was admitted on August 28, 2020.  Patient was found to have closed loop bowel obstruction.  General surgery consulted and he underwent expiratory laparotomy, extensive lysis evaluation, small bowel resection on 09/02/2020. Surgery is closely following.   Assessment & Plan:   Principal Problem:   SBO (small bowel obstruction) (HCC) Active Problems:   Essential hypertension   Personal history of DVT (deep vein thrombosis)   Persistent atrial fibrillation (HCC)   CAD (coronary artery disease)   History of ischemic stroke   Recurrent UTI   Protein-calorie malnutrition, severe   1.  Small bowel obstruction  Presented with abdominal pain Patient was found to have closed loop bowel obstruction.  General surgery consulted and he underwent expiratory laparotomy, extensive lysis evaluation, small bowel resection on 09/02/2020. Surgery is closely following. Continue TPN Continue NG tube with intermittent suction He was also on IV antibiotics, discontinued after 5 days course postop. Currently has a wound VAC on the abdominal surgical wound. No BM yet  2.  Atrial fibrillation  rate controlled Eliquis is on hold  Continue IV heparin Will restart Eliquis when able to take p.o.  3.  Urinary tract infection  Patient was started on ceftriaxone and he has had 3 doses  4.  History of CVA  / coronary artery disease On statin, beta-blocker and Eliquis ,these are on hold due to current  conditions  5.  Hypertension Monitor blood pressure.  Continue current medications  6.  Leukocytosis Improving, mild, most likely reactive  Nutrition Problem: Severe Malnutrition Etiology: acute illness (SBO with no oral intake x 5 days)      DVT prophylaxis:Heparin iV Code Status: Full Family Communication: None at bedside Status is: Inpatient  Remains inpatient appropriate because:Persistent severe electrolyte disturbances and IV treatments appropriate due to intensity of illness or inability to take PO  Dispo: The patient is from: Home              Anticipated d/c is to: Home               Patient currently is not medically stable to d/c.   Difficult to place patient No   Consultants: Surgery  Procedures:As above  Antimicrobials:  Anti-infectives (From admission, onward)    Start     Dose/Rate Route Frequency Ordered Stop   09/02/20 1845  piperacillin-tazobactam (ZOSYN) IVPB 3.375 g        3.375 g 12.5 mL/hr over 240 Minutes Intravenous Every 8 hours 09/02/20 1819 09/07/20 1359   09/02/20 1215  ceFAZolin (ANCEF) IVPB 2g/100 mL premix        2 g 200 mL/hr over 30 Minutes Intravenous  Once 09/02/20 1115 09/02/20 1320   08/29/20 0600  cefTRIAXone (ROCEPHIN) 1 g in sodium chloride 0.9 % 100 mL IVPB        1 g 200 mL/hr over 30 Minutes Intravenous Every 24 hours 08/29/20 0204 08/31/20 0730   08/28/20 2300  piperacillin-tazobactam (ZOSYN) IVPB 3.375 g        3.375 g 100 mL/hr over 30 Minutes Intravenous  Once 08/28/20 2245 08/28/20 2342       Subjective:  Patient seen and examined the bedside this morning.  Hemodynamically stable.  Overall comfortable.  Denies any abdominal pain.  Frustrated because of lack of bowel movement.   Objective: Vitals:   09/06/20 1807 09/06/20 2021 09/07/20 0310 09/07/20 0812  BP:  (!) 141/78 128/82 129/81  Pulse: (!) 106 (!) 104 (!) 101 (!) 107  Resp:  '16 16 19  '$ Temp:  98.5 F (36.9 C) 98.1 F (36.7 C) 98.2 F (36.8 C)   TempSrc:  Oral Oral Oral  SpO2:  97% 98% 95%  Weight:      Height:        Intake/Output Summary (Last 24 hours) at 09/07/2020 0925 Last data filed at 09/07/2020 0859 Gross per 24 hour  Intake 2198.74 ml  Output 3200 ml  Net -1001.26 ml   Filed Weights   08/29/20 0100 08/30/20 0501 09/01/20 0457  Weight: 97.1 kg 99.1 kg 96.5 kg    Examination:  General exam: Overall comfortable, not in distress, pleasant elderly male HEENT: PERRL Respiratory system:  no wheezes or crackles  Cardiovascular system: S1 & S2 heard, RRR.  Gastrointestinal system: Abdomen is mildly distended, soft.  No bowel sounds heard.  Midline abdominal surgical wound with wound VAC Central nervous system: Alert and oriented Extremities: No edema, no clubbing ,no cyanosis Skin: No rashes, no ulcers,no icterus      Data Reviewed: I have personally reviewed following labs and imaging studies  CBC: Recent Labs  Lab 09/02/20 0508 09/03/20 0500 09/04/20 0300 09/06/20 1051 09/07/20 0348  WBC 7.9 8.9 12.5* 15.3* 14.2*  NEUTROABS  --  6.9  --   --   --   HGB 16.0 14.3 14.2 12.7* 11.2*  HCT 48.2 43.3 43.0 39.4 35.5*  MCV 89.8 93.1 92.7 93.1 94.4  PLT 191 131* 145* 219 [id number]   Basic Metabolic Panel: Recent Labs  Lab 09/03/20 0641 09/04/20 0300 09/05/20 0630 09/06/20 1051 09/07/20 0348  NA 144 142 141 139 141  K 4.9 3.1* 3.4* 4.1 4.2  CL 111 105 107 109 111  CO2 '28 31 28 23 24  '$ GLUCOSE 162* 125* 106* 125* 108*  BUN 25* 20 23 35* 35*  CREATININE 0.84 0.76 0.74 1.36* 0.98  CALCIUM 8.1* 8.7* 8.8* 9.2 8.8*  MG 1.9 2.1 2.0 2.1 2.1  PHOS 2.4* 2.6 2.2* 3.7 2.9   GFR: Estimated Creatinine Clearance: 72.4 mL/min (by C-G formula based on SCr of 0.98 mg/dL). Liver Function Tests: Recent Labs  Lab 09/03/20 0641 09/07/20 0348  AST 32 39  ALT 24 33  ALKPHOS 30* 58  BILITOT 1.0 0.8  PROT 4.8* 4.6*  ALBUMIN 2.5* 1.8*   No results for input(s): LIPASE, AMYLASE in the last 168 hours. No results for  input(s): AMMONIA in the last 168 hours. Coagulation Profile: No results for input(s): INR, PROTIME in the last 168 hours. Cardiac Enzymes: No results for input(s): CKTOTAL, CKMB, CKMBINDEX, TROPONINI in the last 168 hours. BNP (last 3 results) No results for input(s): PROBNP in the last 8760 hours. HbA1C: No results for input(s): HGBA1C in the last 72 hours. CBG: Recent Labs  Lab 09/06/20 1213 09/06/20 1620 09/06/20 2340 09/07/20 0410 09/07/20 0816  GLUCAP 142* 113* 135* 119* 129*   Lipid Profile: Recent Labs    09/05/20 0630 09/07/20 0348  TRIG 88 74   Thyroid Function Tests: No results for input(s): TSH, T4TOTAL, FREET4, T3FREE, THYROIDAB in the last 72 hours.  Anemia Panel: No results for input(s): VITAMINB12, FOLATE, FERRITIN, TIBC, IRON, RETICCTPCT in the last 72 hours. Sepsis Labs: No results for input(s): PROCALCITON, LATICACIDVEN in the last 168 hours.  Recent Results (from the past 240 hour(s))  Culture, blood (Routine X 2) w Reflex to ID Panel     Status: None   Collection Time: 08/28/20 10:50 PM   Specimen: BLOOD  Result Value Ref Range Status   Specimen Description BLOOD LEFT ANTECUBITAL  Final   Special Requests   Final    BOTTLES DRAWN AEROBIC AND ANAEROBIC Blood Culture adequate volume   Culture   Final    NO GROWTH 5 DAYS Performed at [facility], [address] [place], [place] [address]    Report Status 09/03/2020 FINAL  Final  Culture, blood (Routine X 2) w Reflex to ID Panel     Status: None   Collection Time: 08/28/20 11:00 PM   Specimen: BLOOD  Result Value Ref Range Status   Specimen Description BLOOD FEMORAL ARTERY RIGHT  Final   Special Requests   Final    BOTTLES DRAWN AEROBIC AND ANAEROBIC Blood Culture adequate volume   Culture   Final    NO GROWTH 5 DAYS Performed at [facility], [place] [address] [place], [place] [address]    Report Status 09/03/2020 FINAL  Final  Urine Culture     Status: Abnormal   Collection  Time: 08/28/20 11:22 PM   Specimen: Urine, Random  Result Value Ref Range Status   Specimen Description   Final    URINE, RANDOM Performed at [facility], [address], [place], [place] [address]    Special Requests   Final    NONE Performed at [facility], [address] [place], [place] [address]    Culture MULTIPLE SPECIES PRESENT, SUGGEST RECOLLECTION (A)  Final   Report Status 08/30/2020 FINAL  Final  Resp Panel by RT-PCR (Flu A&B, Covid)     Status: None   Collection Time: 08/28/20 11:22 PM   Specimen: Nasopharyngeal(NP) swabs in vial transport medium  Result Value Ref Range Status   SARS Coronavirus 2 by RT PCR NEGATIVE NEGATIVE Final    Comment: (NOTE) SARS-CoV-2 target nucleic acids are NOT DETECTED.  The SARS-CoV-2 RNA is generally detectable in upper respiratory specimens during the acute phase of infection. The lowest concentration of SARS-CoV-2 viral copies this assay can detect is 138 copies/mL. A negative result does not preclude SARS-Cov-2 infection and should not be used as the sole basis for treatment or other patient management decisions. A negative result may occur with  improper specimen collection/handling, submission of specimen other than nasopharyngeal swab, presence of viral mutation(s) within the areas targeted by this assay, and inadequate number of viral copies(<138 copies/mL). A negative result must be combined with clinical observations, patient history, and epidemiological information. The expected result is Negative.  Fact Sheet for Patients:  EntrepreneurPulse.com.au  Fact Sheet for Healthcare Providers:  IncredibleEmployment.be  This test is no t yet approved or cleared by the [place] FDA and  has been authorized for detection and/or diagnosis of SARS-CoV-2 by FDA under an Emergency Use Authorization (EUA). This EUA will remain  in effect (meaning this  test can be used) for the duration of the COVID-19 declaration under Section 564(b)(1) of the Act, 21 U.S.C.section 360bbb-3(b)(1), unless the authorization is terminated  or revoked sooner.       Influenza A by PCR NEGATIVE NEGATIVE Final  Influenza B by PCR NEGATIVE NEGATIVE Final    Comment: (NOTE) The Xpert Xpress SARS-CoV-2/FLU/RSV plus assay is intended as an aid in the diagnosis of influenza from Nasopharyngeal swab specimens and should not be used as a sole basis for treatment. Nasal washings and aspirates are unacceptable for Xpert Xpress SARS-CoV-2/FLU/RSV testing.  Fact Sheet for Patients: EntrepreneurPulse.com.au  Fact Sheet for Healthcare Providers: IncredibleEmployment.be  This test is not yet approved or cleared by the [place] FDA and has been authorized for detection and/or diagnosis of SARS-CoV-2 by FDA under an Emergency Use Authorization (EUA). This EUA will remain in effect (meaning this test can be used) for the duration of the COVID-19 declaration under Section 564(b)(1) of the Act, 21 U.S.C. section 360bbb-3(b)(1), unless the authorization is terminated or revoked.  Performed at [facility], [address] [place], [place] [address]   Culture, blood (routine x 2)     Status: None   Collection Time: 08/29/20  2:15 AM   Specimen: BLOOD  Result Value Ref Range Status   Specimen Description BLOOD RIGHT ANTECUBITAL  Final   Special Requests   Final    BOTTLES DRAWN AEROBIC AND ANAEROBIC Blood Culture adequate volume   Culture   Final    NO GROWTH 5 DAYS Performed at [facility], [address] [place], [place] [address]    Report Status 09/03/2020 FINAL  Final  Culture, blood (routine x 2)     Status: None   Collection Time: 08/29/20  2:22 AM   Specimen: BLOOD LEFT HAND  Result Value Ref Range Status   Specimen Description BLOOD LEFT HAND  Final   Special Requests   Final     BOTTLES DRAWN AEROBIC AND ANAEROBIC Blood Culture adequate volume   Culture   Final    NO GROWTH 5 DAYS Performed at [facility], [place] [address], [place], [place] [address]    Report Status 09/03/2020 FINAL  Final  Surgical pcr screen     Status: None   Collection Time: 09/01/20  8:09 AM   Specimen: Nasal Mucosa; Nasal Swab  Result Value Ref Range Status   MRSA, PCR NEGATIVE NEGATIVE Final   Staphylococcus aureus NEGATIVE NEGATIVE Final    Comment: (NOTE) The Xpert SA Assay (FDA approved for NASAL specimens in patients 47 years of age and older), is one component of a comprehensive surveillance program. It is not intended to diagnose infection nor to guide or monitor treatment. Performed at [facility], [place] [address] [place], [place] [address]          Radiology Studies: DG Abd Portable 1V  Result Date: 09/06/2020 CLINICAL DATA:  NG tube placement EXAM: PORTABLE ABDOMEN - 1 VIEW COMPARISON:  09/02/2020 abdominal radiograph FINDINGS: Enteric tube terminates in the proximal stomach. No dilated small bowel loops. Mild colonic gas and stool. No evidence of pneumatosis or pneumoperitoneum. Probable small left pleural effusion with mild left lung base dense opacity. IMPRESSION: Enteric tube terminates in the proximal stomach. Probable small left pleural effusion with dense mild left lung base opacity favoring atelectasis. Electronically Signed   By: Ilona Sorrel M.D.   On: 09/06/2020 09:08        Scheduled Meds:  Chlorhexidine Gluconate Cloth  6 each Topical Daily   insulin aspart  0-15 Units Subcutaneous Q4H   lidocaine  1 patch Transdermal Q24H   pantoprazole (PROTONIX) IV  40 mg Intravenous Q24H   sodium chloride flush  10-40 mL  Intracatheter Q12H   Continuous Infusions:  sodium chloride 10 mL/hr at 09/02/20 0645   heparin 1,850 Units/hr (09/07/20 0848)   methocarbamol (ROBAXIN) IV Stopped (09/07/20 0536)   piperacillin-tazobactam (ZOSYN)  IV 12.5 mL/hr at  09/07/20 0607   TPN ADULT (ION) 100 mL/hr at 09/07/20 0607   TPN ADULT (ION)       LOS: 9 days    Time spent: 25 mins.More than 50% of that time was spent in counseling and/or coordination of care.      Shelly Coss, MD [facility] P7/25/2022, 9:25 AM

## 2020-09-08 DIAGNOSIS — K56609 Unspecified intestinal obstruction, unspecified as to partial versus complete obstruction: Secondary | ICD-10-CM | POA: Diagnosis not present

## 2020-09-08 LAB — CBC
HCT: 34.1 % — ABNORMAL LOW (ref 39.0–52.0)
Hemoglobin: 10.5 g/dL — ABNORMAL LOW (ref 13.0–17.0)
MCH: 29.3 pg (ref 26.0–34.0)
MCHC: 30.8 g/dL (ref 30.0–36.0)
MCV: 95.3 fL (ref 80.0–100.0)
Platelets: 249 10*3/uL (ref 150–400)
RBC: 3.58 MIL/uL — ABNORMAL LOW (ref 4.22–5.81)
RDW: 14.8 % (ref 11.5–15.5)
WBC: 16.3 10*3/uL — ABNORMAL HIGH (ref 4.0–10.5)
nRBC: 0.1 % (ref 0.0–0.2)

## 2020-09-08 LAB — GLUCOSE, CAPILLARY
Glucose-Capillary: 110 mg/dL — ABNORMAL HIGH (ref 70–99)
Glucose-Capillary: 116 mg/dL — ABNORMAL HIGH (ref 70–99)
Glucose-Capillary: 117 mg/dL — ABNORMAL HIGH (ref 70–99)
Glucose-Capillary: 118 mg/dL — ABNORMAL HIGH (ref 70–99)
Glucose-Capillary: 119 mg/dL — ABNORMAL HIGH (ref 70–99)
Glucose-Capillary: 134 mg/dL — ABNORMAL HIGH (ref 70–99)

## 2020-09-08 LAB — HEPARIN LEVEL (UNFRACTIONATED)
Heparin Unfractionated: >1.1 IU/mL — ABNORMAL HIGH (ref 0.30–0.70)
Heparin Unfractionated: <0.1 IU/mL — ABNORMAL LOW (ref 0.30–0.70)

## 2020-09-08 MED ORDER — BISACODYL 10 MG RE SUPP
10.0000 mg | Freq: Once | RECTAL | Status: AC
  Administered 2020-09-08: 10 mg via RECTAL
  Filled 2020-09-08: qty 1

## 2020-09-08 MED ORDER — INSULIN ASPART 100 UNIT/ML IJ SOLN
0.0000 [IU] | Freq: Four times a day (QID) | INTRAMUSCULAR | Status: DC
  Administered 2020-09-08 – 2020-09-10 (×4): 2 [IU] via SUBCUTANEOUS

## 2020-09-08 MED ORDER — PHENOL 1.4 % MT LIQD
1.0000 | OROMUCOSAL | Status: DC | PRN
  Administered 2020-09-08: 1 via OROMUCOSAL
  Filled 2020-09-08: qty 177

## 2020-09-08 MED ORDER — HEPARIN (PORCINE) 25000 UT/250ML-% IV SOLN
1800.0000 [IU]/h | INTRAVENOUS | Status: DC
  Administered 2020-09-08: 1500 [IU]/h via INTRAVENOUS
  Administered 2020-09-09: 1750 [IU]/h via INTRAVENOUS
  Administered 2020-09-09: 1800 [IU]/h via INTRAVENOUS
  Filled 2020-09-08 (×3): qty 250

## 2020-09-08 MED ORDER — TRAVASOL 10 % IV SOLN
INTRAVENOUS | Status: AC
  Filled 2020-09-08: qty 1392

## 2020-09-08 NOTE — Progress Notes (Signed)
PT Cancellation Note  Patient Details Name: William Norman MRN: [id number] DOB: 1940/07/08   Cancelled Treatment:    Reason Eval/Treat Not Completed: Pain limiting ability to participate;Fatigue/lethargy limiting ability to participate. Patient continues to decline PT despite multiple attempts and encouragement this date. Reporting new, burning pain in left lower abdomen. RN notified. Will continue to attempt to mobilize.     Chassidy Layson 09/08/2020, 1:03 PM

## 2020-09-08 NOTE — Progress Notes (Signed)
Pt's NGT sounds like and the patient feels like it has been pulled out some all the way to his throat.  Suction turned off and MD notified.  Pt did have a medium size loose BM this afternoon.  MD gave verbal order to remove NGT.  Will carry out order and continue to monitor.  William Norman

## 2020-09-08 NOTE — Progress Notes (Addendum)
PHARMACY - TOTAL PARENTERAL NUTRITION CONSULT NOTE  Indication: SBO  Patient Measurements: Height: 6' (182.9 cm) Weight: 96.5 kg (212 lb 11.9 oz) IBW/kg (Calculated) : 77.6 TPN AdjBW (KG): 82.8 Body mass index is 28.85 kg/m. Usual Weight: 97-100 kg  Assessment:  79 YOM presented on 7/15 with acute abdominal pain. CT showed inflamed loop of small bowel LEFT upper quadrant with potential closed loop anatomy. This could progress to high-grade obstruction. Initially treated with conservative management, but abdominal xray shows progressive SB distention consistent with SBO.  Pt denies recent weight loss. - PMH significant for GERD, bowel perforation in 2010, colostomy reversal in 2011 and SBO in 2016.   Glucose / Insulin: no hx DM. CBGs 114-129. SSI 4 units/24 hrs. - A1c 5.9 in June 2022 Electrolytes: K 4.2, CoCa: 10.6 slightly elevated, Mg/Phos WNL - [id number] product <55. No new BMET 7/26.  - - goal K >4.0 and Mg >2.0 to help optimize bowel function  Renal: SCr <1. Good UOP.  Hepatic: LFTs WNL. TG74. Albumin 1.8. Prealbumin 14.5>8.7 post-op  Intake / Output; MIVF: UOP 0.9 ml/kg/hr=2142m. NG 1300 ml. LBM 7/15. Wound vac: none noted GI: IV PPI/24h GI Imaging:  - 7/24 Abd x-ray: ordered   GI Surgeries / Procedures:   - 7/20 ex-lap: SBO with closed loop obstruction in LUQ, small bowel resection (~40 cm), lysis of adhesions  Central access: 09/02/20 TPN start date: 09/02/20  Nutritional Goals (RD recommendation 7/21): 2200-2400 kCal, 125-155g AA per day, fluid >/= 2.2 L  Current Nutrition:  NPO TPN - goal rate 1032mhr to prove 139g AA and 2314 kcal   Plan:  No change to TPN today. Continue TPN at 100 ml/hr - this will provide 139 g AA and 2313 kcal meeting 100% of needs  Electrolytes in TPN: removed Ca. Continue Na 504mL, K 50 mEq/L, Mg 7.5 mEq/L, Phos 25 mmol/L. Continue Cl:[id number]. Add standard MVI and trace elements to TPN Change moderate SSI and CBGs to  q6hrs. Standard TPN labs Monday/Thursday and PRN. BMET/Mg/Phos tomorrow Thiamine '100mg'$  x7 days in TPN per RD recommendation (ends 7/27)   Kratos Ruscitti S. RobAlford HighlandharmD, BCPS Clinical Staff Pharmacist [email or web address] 09/08/2020, 7:10 AM

## 2020-09-08 NOTE — Progress Notes (Addendum)
ANTICOAGULATION CONSULT NOTE - Follow Up Consult  Pharmacy Consult for IV Heparin Indication: atrial fibrillation  Allergies  Allergen Reactions   Other Other (See Comments)    Some form of numbing medication used by dentist, almost died   Simvastatin Other (See Comments)    Muscle aches     Patient Measurements: Height: 6' (182.9 cm) Weight: 96.5 kg (212 lb 11.9 oz) IBW/kg (Calculated) : 77.6 Heparin Dosing Weight: 96.5 kg  Vital Signs: Temp: 98.2 F (36.8 C) (07/26 1607) Temp Source: Oral (07/26 1607) BP: 140/76 (07/26 1607) Pulse Rate: 84 (07/26 1607)  Labs: Recent Labs    09/06/20 1051 09/07/20 0348 09/08/20 0314 09/08/20 1341  HGB 12.7* 11.2* 10.5*  --   HCT 39.4 35.5* 34.1*  --   PLT 219 220 249  --   HEPARINUNFRC 0.59 0.51 >1.10* <0.10*  CREATININE 1.36* 0.98  --   --     Estimated Creatinine Clearance: 72.4 mL/min (by C-G formula based on SCr of 0.98 mg/dL).   Assessment: 80 yr old man on apixaban PTA for hx atrial fibrillation (last dose at 1200 on 7/15). Pharmacy was consulted to dose IV heparin while pt is NPO with SBO (pt currently on TPN).   Heparin level ~7.5 hrs after heparin infusion restarted at reduced rate of 1500 units/hr after being held X 1 hr for high heparin level was undetectable. H/H 10.5/34.1 (trending down), plt 249. Per RN, no issues with IV or bleeding observed.  Goal of Therapy:  Heparin level 0.3-0.7 units/ml Monitor platelets by anticoagulation protocol: Yes   Plan:  Increase heparin infusion to 1750 units/hr Check heparin level in 8 hrs Monitor daily heparin level, CBC Monitor for bleeding F/U transition back to apixaban when pt able to take PO medications  Gillermina Hu, PharmD, BCPS, [facility] Clinical Pharmacist 09/08/2020,4:51 PM

## 2020-09-08 NOTE — Progress Notes (Signed)
Progress Note  6 Days Post-Op  Subjective: Feeling ok today, but a bit frustrated with persistent ileus.  Minimal flatus, but still no BM.  Not getting up a ton  Objective: Vital signs in last 24 hours: Temp:  [97.4 F (36.3 C)-99.3 F (37.4 C)] 99.3 F (37.4 C) (07/26 0733) Pulse Rate:  [83-97] 93 (07/26 0733) Resp:  [17-20] 20 (07/26 0733) BP: (126-142)/(71-90) 136/73 (07/26 0733) SpO2:  [94 %-97 %] 94 % (07/26 0733) Last BM Date: 08/28/20  Intake/Output from previous day: 07/25 0701 - 07/26 0700 In: 1282 [I.V.:1019.2; IV Piggyback:262.7] Out: 3400 [Urine:2100; Emesis/NG output:1300] Intake/Output this shift: No intake/output data recorded.  PE: Abd: soft, mild distention, but with a few BS, NGT with 1300cc of bilious output.  Midline incision with wound VAC in place that was changed yesterday.   Lab Results:  Recent Labs    09/07/20 0348 09/08/20 0314  WBC 14.2* 16.3*  HGB 11.2* 10.5*  HCT 35.5* 34.1*  PLT 220 249   BMET Recent Labs    09/06/20 1051 09/07/20 0348  NA 139 141  K 4.1 4.2  CL 109 111  CO2 23 24  GLUCOSE 125* 108*  BUN 35* 35*  CREATININE 1.36* 0.98  CALCIUM 9.2 8.8*   PT/INR No results for input(s): LABPROT, INR in the last 72 hours. CMP     Component Value Date/Time   NA 141 09/07/2020 0348   NA 141 12/03/2019 1509   K 4.2 09/07/2020 0348   CL 111 09/07/2020 0348   CO2 24 09/07/2020 0348   GLUCOSE 108 (H) 09/07/2020 0348   GLUCOSE 94 01/10/2006 1108   BUN 35 (H) 09/07/2020 0348   BUN 10 12/03/2019 1509   CREATININE 0.98 09/07/2020 0348   CREATININE 0.84 09/30/2019 1349   CALCIUM 8.8 (L) 09/07/2020 0348   PROT 4.6 (L) 09/07/2020 0348   PROT 6.5 12/03/2019 1509   ALBUMIN 1.8 (L) 09/07/2020 0348   ALBUMIN 4.2 12/03/2019 1509   AST 39 09/07/2020 0348   ALT 33 09/07/2020 0348   ALKPHOS 58 09/07/2020 0348   BILITOT 0.8 09/07/2020 0348   BILITOT 0.9 12/03/2019 1509   GFRNONAA >60 09/07/2020 0348   GFRAA 97 12/03/2019 1509    Lipase     Component Value Date/Time   LIPASE 15 08/28/2020 1931       Studies/Results: No results found.  Anti-infectives: Anti-infectives (From admission, onward)    Start     Dose/Rate Route Frequency Ordered Stop   09/02/20 1845  piperacillin-tazobactam (ZOSYN) IVPB 3.375 g        3.375 g 12.5 mL/hr over 240 Minutes Intravenous Every 8 hours 09/02/20 1819 09/07/20 1359   09/02/20 1215  ceFAZolin (ANCEF) IVPB 2g/100 mL premix        2 g 200 mL/hr over 30 Minutes Intravenous  Once 09/02/20 1115 09/02/20 1320   08/29/20 0600  cefTRIAXone (ROCEPHIN) 1 g in sodium chloride 0.9 % 100 mL IVPB        1 g 200 mL/hr over 30 Minutes Intravenous Every 24 hours 08/29/20 0204 08/31/20 0730   08/28/20 2300  piperacillin-tazobactam (ZOSYN) IVPB 3.375 g        3.375 g 100 mL/hr over 30 Minutes Intravenous  Once 08/28/20 2245 08/28/20 2342        Assessment/Plan POD 6,  S/P exploratory laparotomy, extensive LOA, SBR x1 09/02/20 Dr. Georgette Dover for closed loop bowel obstruction - patient with expected post-op ileus at this time.  Will try a  suppository to see if this will help jump start his colon a bit. - continue NGT on LIWS, continue TPN - goal K >4.0 and Mg >2.0 to help optimize bowel function (labs pending) - mobilize as tolerated, PT/OT  - VAC m/w/f - as of 7/22 wound measures 19.5 cm x 3.5 cm x 3 cm - WBC 16.  If this continues, may require CT scan to rule out infectious etiology of persistent ileus. Hypokalemia -resolved yesterday   FEN - NPO, NGT to LIWS, IVF @ 50 cc/h, TPN VTE - SCDs, heparin gtt  ID - Zosyn for 5 days post-op, closely follow WBC   A. Fib on Eliquis - on hold  History of CVA January 2022 UTI - competed course of rocephin  Hypertension Hyperlipidemia  LOS: 10 days    Henreitta Cea, [facility] Surgery 09/08/2020, 10:48 AM Please see Amion for pager number during day hours 7:00am-4:30pm

## 2020-09-08 NOTE — Progress Notes (Signed)
ANTICOAGULATION CONSULT NOTE - Follow Up Consult  Pharmacy Consult for heparin Indication: atrial fibrillation  Labs: Recent Labs    09/05/20 0630 09/05/20 1857 09/06/20 1051 09/07/20 0348 09/08/20 0314  HGB  --    < > 12.7* 11.2* 10.5*  HCT  --   --  39.4 35.5* 34.1*  PLT  --   --  219 220 249  HEPARINUNFRC 0.19*   < > 0.59 0.51 >1.10*  CREATININE 0.74  --  1.36* 0.98  --    < > = values in this interval not displayed.    Assessment: 80yo male supratherapeutic on heparin after two levels at goal; no infusion issues or signs of bleeding per RN.  Goal of Therapy:  Heparin level 0.3-0.7 units/ml   Plan:  Will hold heparin infusion x1h then decrease heparin infusion by 3-4 units/kg/hr to 1500 units/hr and check level in 6 hours.    Wynona Neat, PharmD, BCPS  09/08/2020,4:43 AM

## 2020-09-08 NOTE — Progress Notes (Signed)
PROGRESS NOTE    William Norman  [id number] DOB: 07/13/1940 DOA: 08/28/2020 PCP: Vivi Barrack, MD   Chief Complain:Abdominal pain  Brief Narrative:  Patient is a 80 year old male with medical history significant for atrial fibrillation on Eliquis, coronary artery disease, history of CVA,, hypertension, perforated bowel in 2010 colostomy status post colostomy reversal in 2011 and small bowel obstruction 2016 that resolved with conservative management who presented with abdominal pain and was admitted on August 28, 2020.  Patient was found to have closed loop bowel obstruction.  General surgery consulted and he underwent expiratory laparotomy, extensive lysis evaluation, small bowel resection on 09/02/2020. Surgery is closely following.  Awaiting bowel movement   Assessment & Plan:   Principal Problem:   SBO (small bowel obstruction) (HCC) Active Problems:   Essential hypertension   Personal history of DVT (deep vein thrombosis)   Persistent atrial fibrillation (HCC)   CAD (coronary artery disease)   History of ischemic stroke   Recurrent UTI   Protein-calorie malnutrition, severe   1.  Small bowel obstruction  Presented with abdominal pain Patient was found to have closed loop bowel obstruction.  General surgery consulted and he underwent expiratory laparotomy, extensive lysis evaluation, small bowel resection on 09/02/2020. Surgery is closely following. Continue TPN Continue NG tube with intermittent suction He was also on IV antibiotics, discontinued after 5 days course postop. Currently has a wound VAC on the abdominal surgical wound. No BM yet.  Denies passing flatus.  2.  Atrial fibrillation  rate controlled Eliquis is on hold  Continue IV heparin Will restart Eliquis when able to take p.o.  3.  Urinary tract infection  Patient was started on ceftriaxone and he has had 3 doses  4.  History of CVA  / coronary artery disease On statin, beta-blocker and  Eliquis ,these are on hold due to current conditions  5.  Hypertension Monitor blood pressure.  Continue current medications  6.  Leukocytosis WBC trended up today.  Patient is afebrile.  May warrant CT abdomen if continues to go up  Nutrition Problem: Severe Malnutrition Etiology: acute illness (SBO with no oral intake x 5 days)      DVT prophylaxis:Heparin iV Code Status: Full Family Communication: called spouse on phone on 09/08/20,call not received Status is: Inpatient  Remains inpatient appropriate because:Persistent severe electrolyte disturbances and IV treatments appropriate due to intensity of illness or inability to take PO  Dispo: The patient is from: Home              Anticipated d/c is to: Home               Patient currently is not medically stable to d/c.   Difficult to place patient No   Consultants: Surgery  Procedures:As above  Antimicrobials:  Anti-infectives (From admission, onward)    Start     Dose/Rate Route Frequency Ordered Stop   09/02/20 1845  piperacillin-tazobactam (ZOSYN) IVPB 3.375 g        3.375 g 12.5 mL/hr over 240 Minutes Intravenous Every 8 hours 09/02/20 1819 09/07/20 1359   09/02/20 1215  ceFAZolin (ANCEF) IVPB 2g/100 mL premix        2 g 200 mL/hr over 30 Minutes Intravenous  Once 09/02/20 1115 09/02/20 1320   08/29/20 0600  cefTRIAXone (ROCEPHIN) 1 g in sodium chloride 0.9 % 100 mL IVPB        1 g 200 mL/hr over 30 Minutes Intravenous Every 24 hours 08/29/20 0204 08/31/20 0730  08/28/20 2300  piperacillin-tazobactam (ZOSYN) IVPB 3.375 g        3.375 g 100 mL/hr over 30 Minutes Intravenous  Once 08/28/20 2245 08/28/20 2342       Subjective:  Patient seen and examined the bedside this morning.  Hemodynamically stable.  Overall comfortable.  Denies any abdominal pain.  Frustrated because of lack of bowel movement.   Objective: Vitals:   09/07/20 2031 09/08/20 0006 09/08/20 0429 09/08/20 0733  BP: (!) 142/90 139/79 134/71  136/73  Pulse: 97 87 95 93  Resp: '17 17 17 20  '$ Temp: 98.5 F (36.9 C) 99.1 F (37.3 C) 98.6 F (37 C) 99.3 F (37.4 C)  TempSrc: Oral Oral Oral Oral  SpO2: 97% 96% 96% 94%  Weight:      Height:        Intake/Output Summary (Last 24 hours) at 09/08/2020 1133 Last data filed at 09/08/2020 [id number] Gross per 24 hour  Intake 1281.95 ml  Output 2200 ml  Net -918.05 ml   Filed Weights   08/29/20 0100 08/30/20 0501 09/01/20 0457  Weight: 97.1 kg 99.1 kg 96.5 kg    Examination:  General exam: Overall comfortable, not in distress, pleasant elderly male HEENT: PERRL Respiratory system:  no wheezes or crackles  Cardiovascular system: S1 & S2 heard, RRR.  Gastrointestinal system: Abdomen is mildly distended, soft.  No bowel sounds heard.  Midline abdominal surgical wound with wound VAC Central nervous system: Alert and oriented Extremities: No edema, no clubbing ,no cyanosis Skin: No rashes, no ulcers,no icterus      Data Reviewed: I have personally reviewed following labs and imaging studies  CBC: Recent Labs  Lab 09/03/20 0500 09/04/20 0300 09/06/20 1051 09/07/20 0348 09/08/20 0314  WBC 8.9 12.5* 15.3* 14.2* 16.3*  NEUTROABS 6.9  --   --  11.2*  --   HGB 14.3 14.2 12.7* 11.2* 10.5*  HCT 43.3 43.0 39.4 35.5* 34.1*  MCV 93.1 92.7 93.1 94.4 95.3  PLT 131* 145* 219 220 0000000   Basic Metabolic Panel: Recent Labs  Lab 09/03/20 0641 09/04/20 0300 09/05/20 0630 09/06/20 1051 09/07/20 0348  NA 144 142 141 139 141  K 4.9 3.1* 3.4* 4.1 4.2  CL 111 105 107 109 111  CO2 '28 31 28 23 24  '$ GLUCOSE 162* 125* 106* 125* 108*  BUN 25* 20 23 35* 35*  CREATININE 0.84 0.76 0.74 1.36* 0.98  CALCIUM 8.1* 8.7* 8.8* 9.2 8.8*  MG 1.9 2.1 2.0 2.1 2.1  PHOS 2.4* 2.6 2.2* 3.7 2.9   GFR: Estimated Creatinine Clearance: 72.4 mL/min (by C-G formula based on SCr of 0.98 mg/dL). Liver Function Tests: Recent Labs  Lab 09/03/20 0641 09/07/20 0348  AST 32 39  ALT 24 33  ALKPHOS 30* 58   BILITOT 1.0 0.8  PROT 4.8* 4.6*  ALBUMIN 2.5* 1.8*   No results for input(s): LIPASE, AMYLASE in the last 168 hours. No results for input(s): AMMONIA in the last 168 hours. Coagulation Profile: No results for input(s): INR, PROTIME in the last 168 hours. Cardiac Enzymes: No results for input(s): CKTOTAL, CKMB, CKMBINDEX, TROPONINI in the last 168 hours. BNP (last 3 results) No results for input(s): PROBNP in the last 8760 hours. HbA1C: No results for input(s): HGBA1C in the last 72 hours. CBG: Recent Labs  Lab 09/07/20 1547 09/07/20 2033 09/08/20 0019 09/08/20 0431 09/08/20 0732  GLUCAP 114* 117* 116* 119* 118*   Lipid Profile: Recent Labs    09/07/20 0348  TRIG 74  Thyroid Function Tests: No results for input(s): TSH, T4TOTAL, FREET4, T3FREE, THYROIDAB in the last 72 hours. Anemia Panel: No results for input(s): VITAMINB12, FOLATE, FERRITIN, TIBC, IRON, RETICCTPCT in the last 72 hours. Sepsis Labs: No results for input(s): PROCALCITON, LATICACIDVEN in the last 168 hours.  Recent Results (from the past 240 hour(s))  Surgical pcr screen     Status: None   Collection Time: 09/01/20  8:09 AM   Specimen: Nasal Mucosa; Nasal Swab  Result Value Ref Range Status   MRSA, PCR NEGATIVE NEGATIVE Final   Staphylococcus aureus NEGATIVE NEGATIVE Final    Comment: (NOTE) The Xpert SA Assay (FDA approved for NASAL specimens in patients 68 years of age and older), is one component of a comprehensive surveillance program. It is not intended to diagnose infection nor to guide or monitor treatment. Performed at [facility], [place] [address] [place], [place] [address]          Radiology Studies: No results found.      Scheduled Meds:  bisacodyl  10 mg Rectal Once   Chlorhexidine Gluconate Cloth  6 each Topical Daily   insulin aspart  0-15 Units Subcutaneous Q6H   lidocaine  1 patch Transdermal Q24H   pantoprazole (PROTONIX) IV  40 mg Intravenous Q24H    sodium chloride flush  10-40 mL Intracatheter Q12H   Continuous Infusions:  sodium chloride 10 mL/hr at 09/02/20 0645   heparin 1,500 Units/hr (09/08/20 0620)   methocarbamol (ROBAXIN) IV 1,000 mg (09/08/20 0620)   TPN ADULT (ION) 100 mL/hr at 09/07/20 1658   TPN ADULT (ION)       LOS: 10 days    Time spent: 25 mins.More than 50% of that time was spent in counseling and/or coordination of care.      Shelly Coss, MD [facility] P7/26/2022, 11:33 AM

## 2020-09-08 NOTE — Progress Notes (Signed)
PT Cancellation Note  Patient Details Name: William Norman MRN: [id number] DOB: [birth year]   Cancelled Treatment:    Reason Eval/Treat Not Completed: Fatigue/lethargy limiting ability to participate. Patient asking for something for sore throat. RN notified. Patient declining PT at this time. Will re-attempt later this pm.      Kassem Kibbe 09/08/2020, 10:53 AM

## 2020-09-09 ENCOUNTER — Inpatient Hospital Stay (HOSPITAL_COMMUNITY): Payer: Medicare HMO

## 2020-09-09 ENCOUNTER — Other Ambulatory Visit: Payer: Self-pay | Admitting: Family Medicine

## 2020-09-09 DIAGNOSIS — K56609 Unspecified intestinal obstruction, unspecified as to partial versus complete obstruction: Secondary | ICD-10-CM | POA: Diagnosis not present

## 2020-09-09 LAB — BASIC METABOLIC PANEL
Anion gap: 3 — ABNORMAL LOW (ref 5–15)
BUN: 27 mg/dL — ABNORMAL HIGH (ref 8–23)
CO2: 23 mmol/L (ref 22–32)
Calcium: 8.9 mg/dL (ref 8.9–10.3)
Chloride: 110 mmol/L (ref 98–111)
Creatinine, Ser: 0.69 mg/dL (ref 0.61–1.24)
GFR, Estimated: >60 mL/min (ref >60)
Glucose, Bld: 121 mg/dL — ABNORMAL HIGH (ref 70–99)
Potassium: 4.3 mmol/L (ref 3.5–5.1)
Sodium: 136 mmol/L (ref 135–145)

## 2020-09-09 LAB — HEPARIN LEVEL (UNFRACTIONATED)
Heparin Unfractionated: 0.49 IU/mL (ref 0.30–0.70)
Heparin Unfractionated: 0.31 IU/mL (ref 0.30–0.70)

## 2020-09-09 LAB — CBC
HCT: 32.2 % — ABNORMAL LOW (ref 39.0–52.0)
Hemoglobin: 9.9 g/dL — ABNORMAL LOW (ref 13.0–17.0)
MCH: 29.2 pg (ref 26.0–34.0)
MCHC: 30.7 g/dL (ref 30.0–36.0)
MCV: 95 fL (ref 80.0–100.0)
Platelets: 277 10*3/uL (ref 150–400)
RBC: 3.39 MIL/uL — ABNORMAL LOW (ref 4.22–5.81)
RDW: 14.9 % (ref 11.5–15.5)
WBC: 17.3 10*3/uL — ABNORMAL HIGH (ref 4.0–10.5)
nRBC: 0.2 % (ref 0.0–0.2)

## 2020-09-09 LAB — GLUCOSE, CAPILLARY
Glucose-Capillary: 122 mg/dL — ABNORMAL HIGH (ref 70–99)
Glucose-Capillary: 123 mg/dL — ABNORMAL HIGH (ref 70–99)
Glucose-Capillary: 115 mg/dL — ABNORMAL HIGH (ref 70–99)

## 2020-09-09 LAB — PHOSPHORUS: Phosphorus: 3.1 mg/dL (ref 2.5–4.6)

## 2020-09-09 LAB — MAGNESIUM: Magnesium: 2.1 mg/dL (ref 1.7–2.4)

## 2020-09-09 IMAGING — CT CT ABD-PELV W/ CM
2 of 5 series · 16 of 46 positions shown, 18 images · IV contrast (omnipaque)
Comparison: CT [DATE]

CLINICAL DATA: Abdominal pain, fever, post-op leukocytosis,
prolonged post op ileus

EXAM:
CT ABDOMEN AND PELVIS WITH CONTRAST
TECHNIQUE: Multidetector CT imaging of the abdomen and pelvis was performed
using the standard protocol following bolus administration of
intravenous contrast.
CONTRAST:  100mL OMNIPAQUE IOHEXOL 300 MG/ML  SOLN

[Series 3: abdomen 5.0 · axial · 0.91mm/px · z∈[+1031,+1471]mm · 13 of 102 slices shown, 15 images]
[im 7/102  soft-tissue]
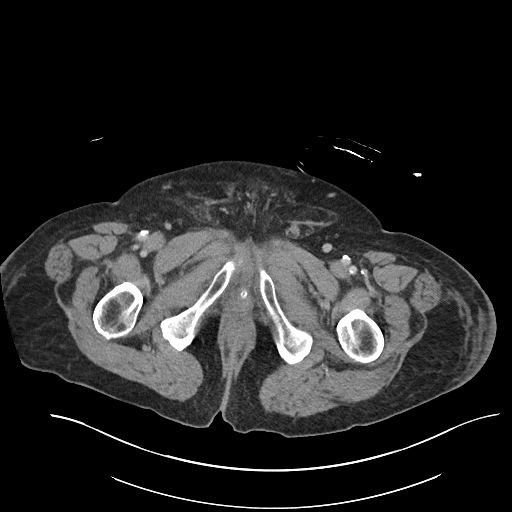
[im 7/102  bone]
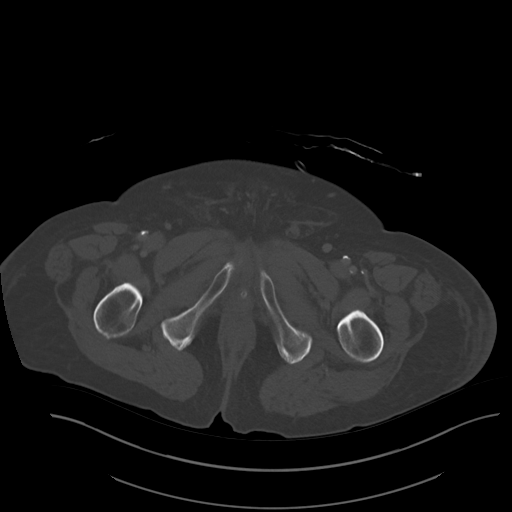
[im 14/102  soft-tissue]
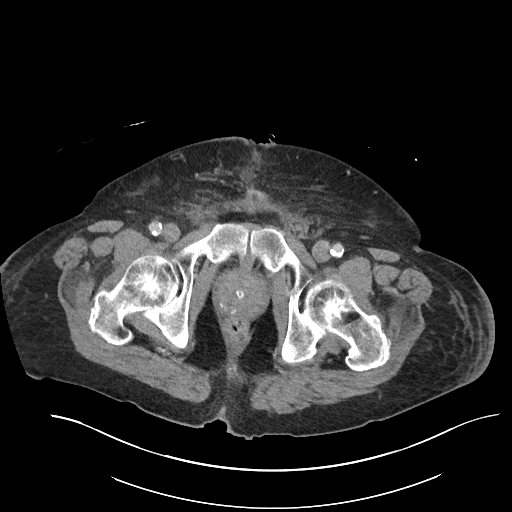
[im 21/102  soft-tissue]
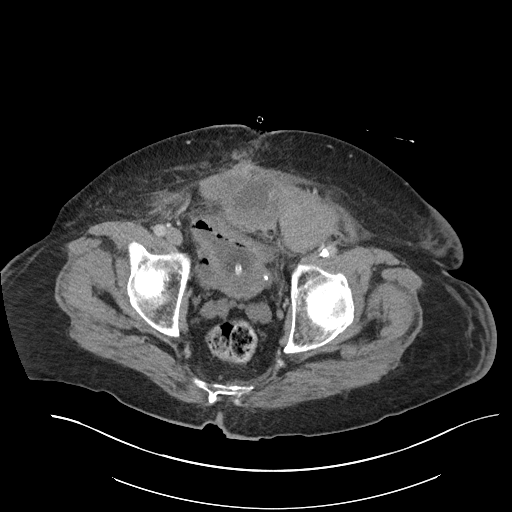
[im 27/102  soft-tissue]
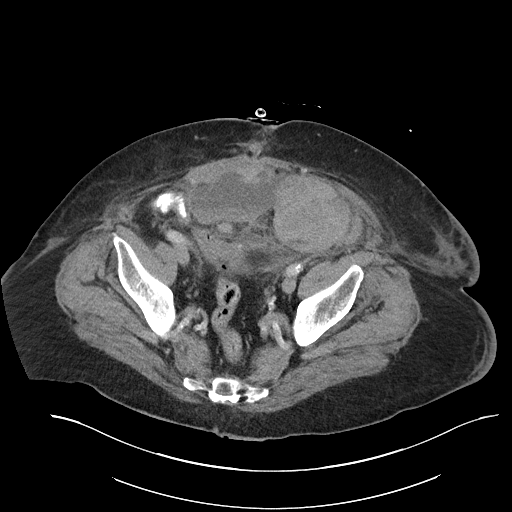
[im 34/102  soft-tissue]
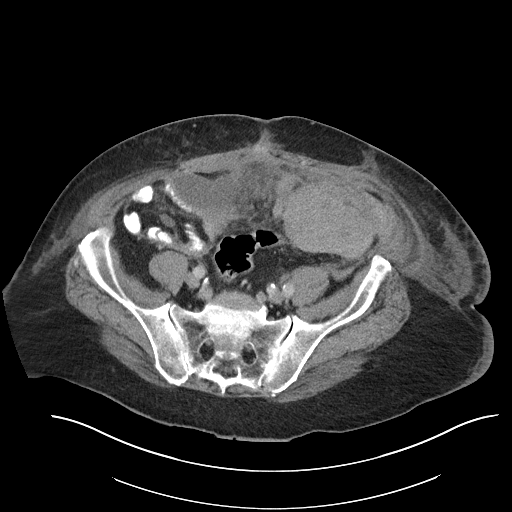
[im 41/102  soft-tissue]
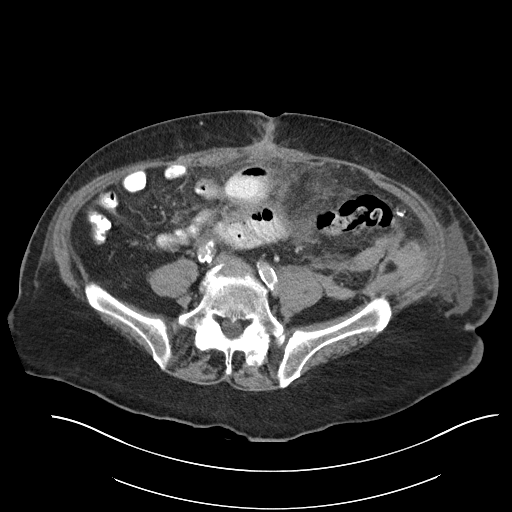
[im 54/102  soft-tissue]
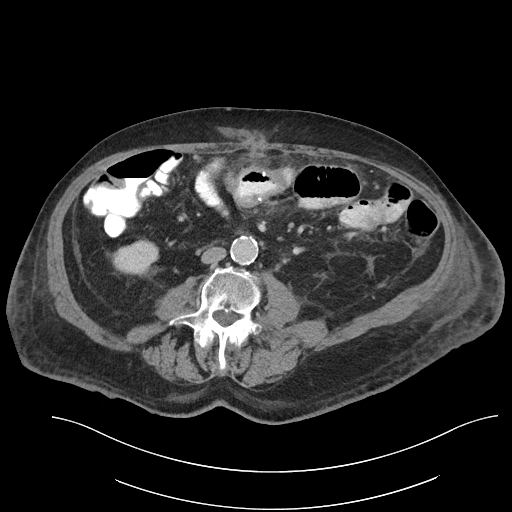
[im 61/102  soft-tissue]
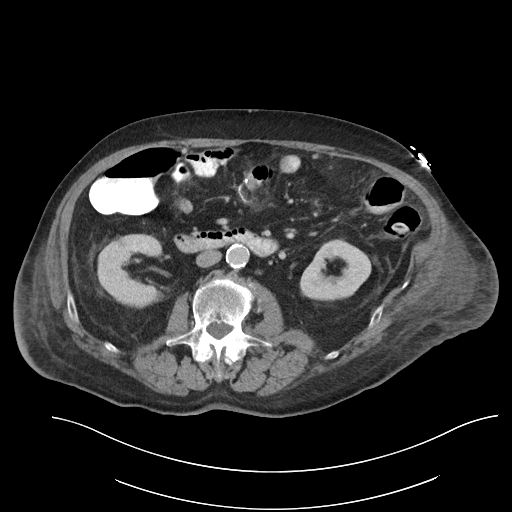
[im 68/102  soft-tissue]
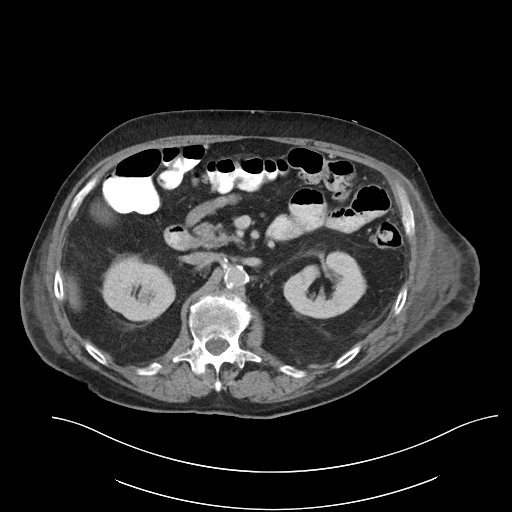
[im 68/102  bone]
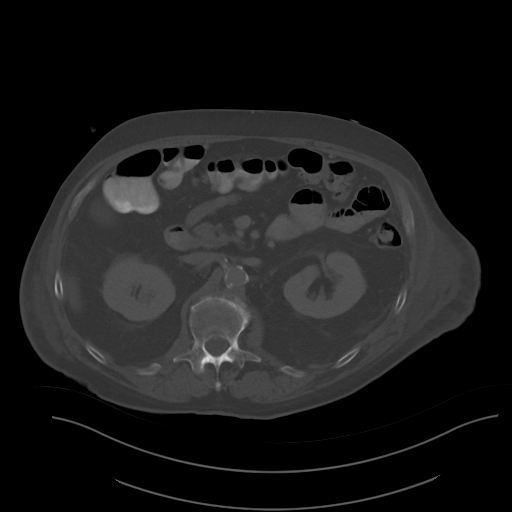
[im 75/102  soft-tissue]
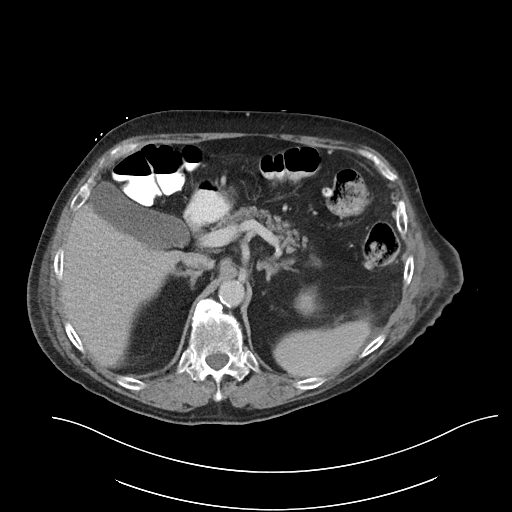
[im 81/102  soft-tissue]
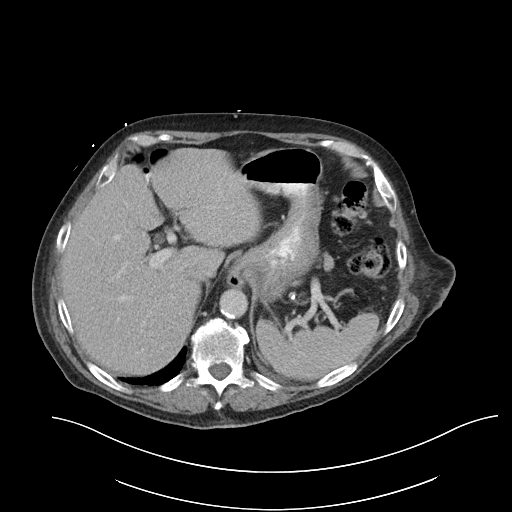
[im 88/102  soft-tissue]
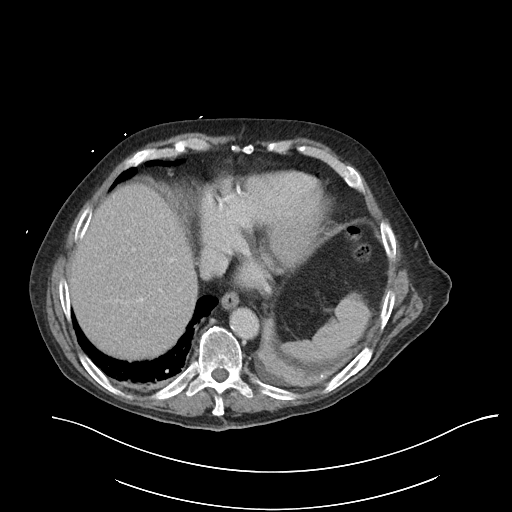
[im 95/102  soft-tissue]
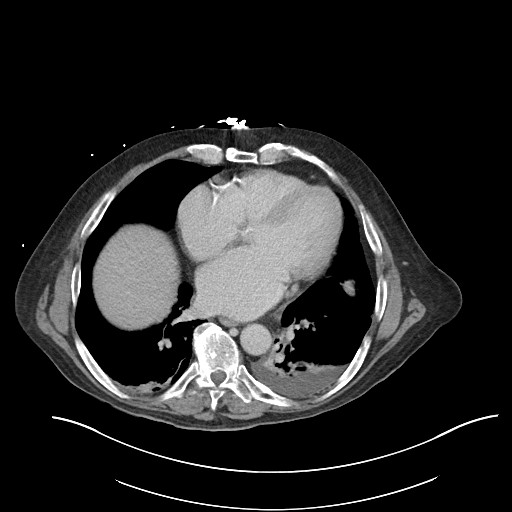

[Series 6: abdomen 3.0 mpr cor · coronal · 0.92mm/px · 3 of 116 slices shown]
[im 39/116  soft-tissue]
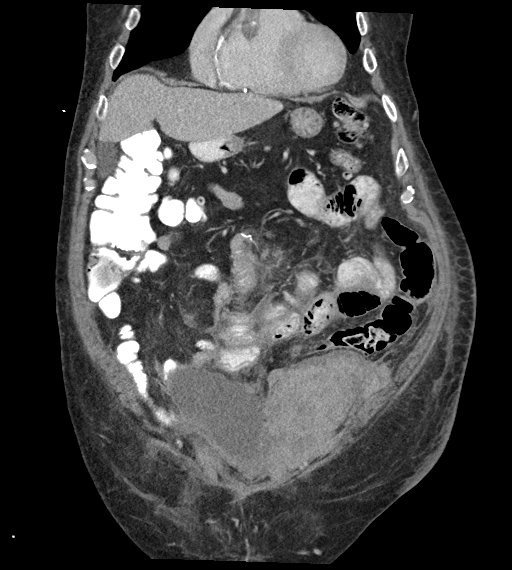
[im 52/116  soft-tissue]
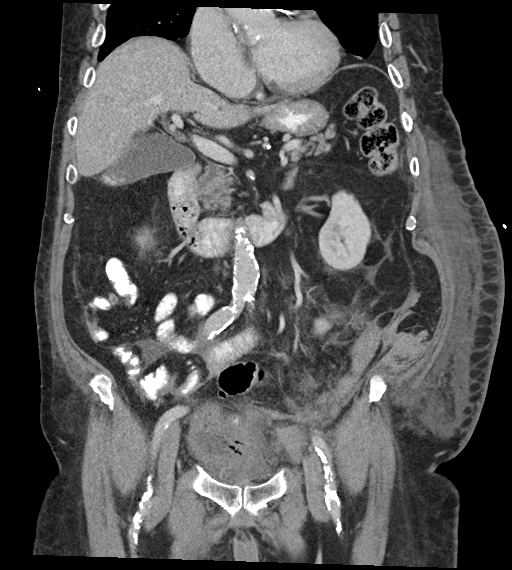
[im 64/116  soft-tissue]
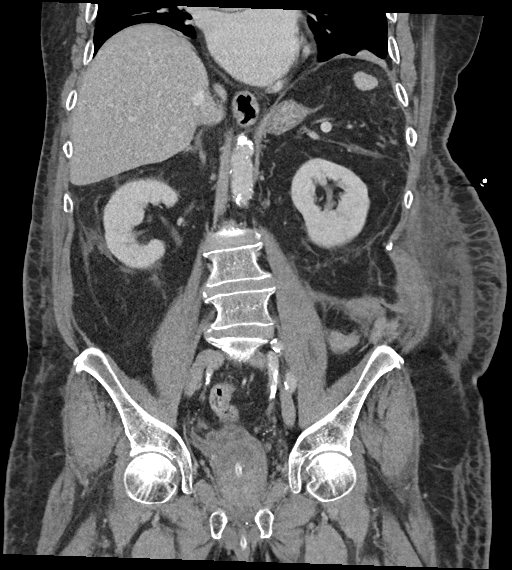

[16 of 46 positions shown; findings below may reference images not displayed]

FINDINGS: Lower chest: Bibasilar atelectasis, small left and trace right
effusions. Mild paraseptal emphysema. Coronary artery
calcifications. Prominent left atrium.

Hepatobiliary: No focal liver abnormality is seen. Cholelithiasis no
evidence of cholecystitis.

Pancreas: Unremarkable. No pancreatic ductal dilatation or
surrounding inflammatory changes.

Spleen: Normal in size without focal abnormality.

Adrenals/Urinary Tract: Adrenal glands are unremarkable. No
hydronephrosis. Unchanged right renal cyst. The bladder is
decompressed with Foley catheter in place in with intraluminal gas.

Stomach/Bowel: Small hiatal hernia. The stomach is within normal
limits. There is no evidence of bowel obstruction. Evidence of small
bowel resection. Oral contrast material is noted throughout the
small bowel and within the ascending and transverse colon. There is
no evidence of contrast leak the appendix is normal. There is
extensive postsurgical inflammatory change in the mid to lower
central abdomen and anterior pelvis. In the anterior pelvis, there
is a heterogeneous collection, on the right side of this collection
there is low-density fluid with a fluid fluid level layering
posteriorly with more dense material, and the left anterior pelvis
there is a or heterogeneous higher density appearance. This entire
area measures approximately 18.0 x 6.8 cm.

Vascular/Lymphatic: Aortoiliac atherosclerotic calcifications. No
AAA. No lymphadenopathy.

Reproductive: Mildly enlarged prostate.

Other: Prior midline incision.

Musculoskeletal: No acute osseous abnormality. No suspicious lytic
or blastic lesions. Multilevel degenerative changes of the spine.
IMPRESSION: Large heterogeneous collection in the anterior pelvis measuring
x 6.8 cm in the axial dimension, favored to represent a hematoma
with varying ages of blood products. Given the patient's
leukocytosis, an infected hematoma is possible.

Recent small-bowel resection with extensive postsurgical
inflammatory changes in the central mid abdomen and pelvis. No
evidence of anastomotic leak. No evidence of bowel obstruction.

These results were called by telephone at the time of interpretation
on [DATE] at [DATE] to provider MODAK, who verbally
acknowledged these results.

## 2020-09-09 MED ORDER — TRAVASOL 10 % IV SOLN
INTRAVENOUS | Status: AC
  Filled 2020-09-09: qty 1392

## 2020-09-09 MED ORDER — PIPERACILLIN-TAZOBACTAM 3.375 G IVPB
3.3750 g | Freq: Three times a day (TID) | INTRAVENOUS | Status: DC
  Administered 2020-09-10 – 2020-09-14 (×13): 3.375 g via INTRAVENOUS
  Filled 2020-09-09 (×14): qty 50

## 2020-09-09 MED ORDER — PIPERACILLIN-TAZOBACTAM 3.375 G IVPB 30 MIN
3.3750 g | Freq: Once | INTRAVENOUS | Status: AC
  Administered 2020-09-09: 3.375 g via INTRAVENOUS
  Filled 2020-09-09 (×2): qty 50

## 2020-09-09 MED ORDER — IOHEXOL 9 MG/ML PO SOLN
ORAL | Status: AC
  Administered 2020-09-09: 500 mL
  Filled 2020-09-09: qty 1000

## 2020-09-09 MED ORDER — IOHEXOL 300 MG/ML  SOLN
100.0000 mL | Freq: Once | INTRAMUSCULAR | Status: AC | PRN
  Administered 2020-09-09: 100 mL via INTRAVENOUS

## 2020-09-09 NOTE — Progress Notes (Signed)
Occupational Therapy Treatment Patient Details Name: Aydeen Strathman MRN: [id number] DOB: [birth year] Today's Date: 09/09/2020    History of present illness 80 y.o. male presenting with abdominal pain 08/28/20. +small bowel obstruction, rate-controlled afib; 09/02/20 S/P exploratory laparotomy, extensive LOA, SBR x1    PMH significant for atrial fibrillation on Eliquis, CAD, history of CVA, hypertension, perforated bowel in 2010, colostomy reversal in 2011, and SBO in 2016 that resolved with conservative management, now   OT comments  Pt limited this session by weakness and decreased activity tolerance. With all OOB mobility, pt requiring increased assist and frequent rest breaks. Pt overall at min-mod A levels with ADL's and transfer due to safety, balance concerns, and weakness. Pt discharge plan was updated to CIR, as pt would benefit in intensive therapy services, to maximize pt's functional mobility and ADL performance. OT will continue to follow acutely.    Follow Up Recommendations  CIR;Supervision/Assistance - 24 hour    Equipment Recommendations  None recommended by OT    Recommendations for Other Services Rehab consult    Precautions / Restrictions Precautions Precautions: Fall Precaution Comments: abdominal wound vac, monitor HR, coretrak Restrictions Weight Bearing Restrictions: No Other Position/Activity Restrictions: used pillow for abdominal bracing.       Mobility Bed Mobility Overal bed mobility: Needs Assistance Bed Mobility: Sit to Supine       Sit to supine: Mod assist   General bed mobility comments: Mod A to bring BLE into the bed.    Transfers Overall transfer level: Needs assistance Equipment used: Rolling walker (2 wheeled) Transfers: Sit to/from Stand Sit to Stand: Min assist;+2 safety/equipment         General transfer comment: Min A to power up and steady    Balance Overall balance assessment: Needs assistance   Sitting  balance-Leahy Scale: Fair Sitting balance - Comments: balance poor until able to move LEs to edge of bed for B LE support.   Standing balance support: Bilateral upper extremity supported Standing balance-Leahy Scale: Poor Standing balance comment: Heavy reliance on UE use and external assistance                           ADL either performed or assessed with clinical judgement   ADL Overall ADL's : Needs assistance/impaired Eating/Feeding: NPO   Grooming: Wash/dry face;Wash/dry hands;Set up;Sitting Grooming Details (indicate cue type and reason): on BSC     Lower Body Bathing: Minimal assistance;Sitting/lateral leans;Moderate assistance;Sit to/from stand Lower Body Bathing Details (indicate cue type and reason): When leaning pt does well, with standing, due to weakness, pt is very off balance.         Toilet Transfer: Minimal assistance;+2 for safety/equipment;Stand-pivot Toilet Transfer Details (indicate cue type and reason): Pt using RW, very weak and fatiguing quickly. Requiring rest break after each transfer [place] and Hygiene: Minimal assistance;Sitting/lateral lean;Moderate assistance;Sit to/from stand Toileting - Clothing Manipulation Details (indicate cue type and reason): Safer with leaning, requiring more assist with standing due to weakness.     Functional mobility during ADLs: Minimal assistance;Rolling walker General ADL Comments: Pt presenting with increased weakness and decreased activity tolerance. He is requiring increased assist to complete OOB activities, as well as rest breaks.     Vision       Perception     Praxis      Cognition Arousal/Alertness: Awake/alert Behavior During Therapy: WFL for tasks assessed/performed Overall Cognitive Status: Within Functional Limits for tasks assessed  Exercises     Shoulder Instructions       General Comments VSS  on RA    Pertinent Vitals/ Pain       Pain Assessment: No/denies pain  Home Living                                          Prior Functioning/Environment              Frequency  Min 2X/week        Progress Toward Goals  OT Goals(current goals can now be found in the care plan section)  Progress towards OT goals: Progressing toward goals  Acute Rehab OT Goals Patient Stated Goal: be able to go home OT Goal Formulation: With patient Time For Goal Achievement: 09/18/20 Potential to Achieve Goals: Good ADL Goals Pt Will Perform Grooming: Independently;standing Pt Will Perform Lower Body Bathing: with modified independence;with adaptive equipment;sit to/from stand Pt Will Perform Lower Body Dressing: with modified independence;with adaptive equipment;sit to/from stand Pt Will Transfer to Toilet: with modified independence;ambulating;bedside commode Pt Will Perform Toileting - Clothing Manipulation and hygiene: with modified independence;sit to/from stand Additional ADL Goal #1: Pt will be Mod I in and OOB for basic ADLs (HOB down, no rail, increased time)  Plan Discharge plan needs to be updated;Frequency remains appropriate    Co-evaluation                 AM-PAC OT "6 Clicks" Daily Activity     Outcome Measure   Help from another person eating meals?: None Help from another person taking care of personal grooming?: A Little Help from another person toileting, which includes using toliet, bedpan, or urinal?: A Little Help from another person bathing (including washing, rinsing, drying)?: A Lot Help from another person to put on and taking off regular upper body clothing?: A Little Help from another person to put on and taking off regular lower body clothing?: A Lot 6 Click Score: 17    End of Session Equipment Utilized During Treatment: Gait belt;Rolling walker  OT Visit Diagnosis: Unsteadiness on feet (R26.81);Muscle weakness  (generalized) (M62.81);Pain Pain - part of body:  (Abdomen)   Activity Tolerance Patient tolerated treatment well;Patient limited by fatigue   Patient Left in bed;with call bell/phone within reach;with nursing/sitter in room   Nurse Communication Mobility status        Time: 1530-1550 OT Time Calculation (min): 20 min  Charges: OT General Charges $OT Visit: 1 Visit OT Treatments $Self Care/Home Management : 8-22 mins  Tristyn Demarest H., OTR/L Acute Rehabilitation  Jairo Bellew Elane Randye Treichler 09/09/2020, 4:02 PM

## 2020-09-09 NOTE — Progress Notes (Signed)
Rehab Admissions Coordinator Note:  Patient was screened by Cleatrice Burke for appropriateness for an Inpatient Acute Rehab Consult.  At this time, we are recommending Inpatient Rehab consult. I will place order per protocol.  Cleatrice Burke RN MSN 09/09/2020, 8:26 PM  I can be reached at [phone].

## 2020-09-09 NOTE — Progress Notes (Signed)
Physical Therapy Treatment Patient Details Name: William Norman MRN: [id number] DOB: 1940-07-14 Today's Date: 09/09/2020    History of Present Illness 80 y.o. male presenting with abdominal pain 08/28/20. +small bowel obstruction, rate-controlled afib; 09/02/20 S/P exploratory laparotomy, extensive LOA, SBR x1    PMH significant for atrial fibrillation on Eliquis, CAD, history of CVA, hypertension, perforated bowel in 2010, colostomy reversal in 2011, and SBO in 2016 that resolved with conservative management, now    PT Comments    Pt supine in bed on arrival.  He is agreeable to PT session and denies pain at rest.  However when movement starts pain does increase.  HR elevated with limited activity to 150 bpm this session so limited to transfer to chair.  Pt continues to benefit from skilled rehab and based on decline in function will update recommendations to CIR.      Follow Up Recommendations  CIR     Equipment Recommendations  None recommended by PT    Recommendations for Other Services       Precautions / Restrictions Precautions Precautions: Fall Precaution Comments: abdominal wound vac, monitor HR Restrictions Weight Bearing Restrictions: No Other Position/Activity Restrictions: used pillow for abdominal bracing.    Mobility  Bed Mobility Overal bed mobility: Needs Assistance Bed Mobility: Rolling;Sidelying to Sit Rolling: Min assist Sidelying to sit: Mod assist       General bed mobility comments: Mod assistance to elevate trunk into sitting with PTA using pillow for abdominal bracing.    Transfers Overall transfer level: Needs assistance Equipment used: Rolling walker (2 wheeled) Transfers: Sit to/from Stand Sit to Stand: Mod assist;+2 physical assistance         General transfer comment: Pt required bed height elevated and mod assistance to achieve standing.  Pt very unsteady in standing.  Increased WOB this  session.  Ambulation/Gait Ambulation/Gait assistance: Mod assist Gait Distance (Feet): 6 Feet Assistive device: Rolling walker (2 wheeled) Gait Pattern/deviations: Step-through pattern;Trunk flexed     General Gait Details: Cues for sequencing and RW use, assistance for turns and backing.  HR elevated to greater than 150 bpm and limited to short gt trial from bed to recliner.   Stairs             Wheelchair Mobility    Modified Rankin (Stroke Patients Only)       Balance Overall balance assessment: Needs assistance   Sitting balance-Leahy Scale: Fair Sitting balance - Comments: balance poor until able to move LEs to edge of bed for B LE support.   Standing balance support: Bilateral upper extremity supported Standing balance-Leahy Scale: Poor Standing balance comment: Heavy reliance on UE use and external assistance                            Cognition Arousal/Alertness: Awake/alert Behavior During Therapy: WFL for tasks assessed/performed Overall Cognitive Status: Within Functional Limits for tasks assessed                                        Exercises      General Comments        Pertinent Vitals/Pain Pain Assessment: 0-10 Pain Score: 6  Pain Location: abdomen Pain Descriptors / Indicators: Guarding;Grimacing;Moaning;Operative site guarding Pain Intervention(s): Monitored during session;Repositioned    Home Living  Prior Function            PT Goals (current goals can now be found in the care plan section) Acute Rehab PT Goals Patient Stated Goal: be able to go home Potential to Achieve Goals: Good Progress towards PT goals: Progressing toward goals    Frequency    Min 3X/week      PT Plan Current plan remains appropriate    Co-evaluation              AM-PAC PT "6 Clicks" Mobility   Outcome Measure  Help needed turning from your back to your side while in a flat bed  without using bedrails?: A Little Help needed moving from lying on your back to sitting on the side of a flat bed without using bedrails?: A Little Help needed moving to and from a bed to a chair (including a wheelchair)?: A Little Help needed standing up from a chair using your arms (e.g., wheelchair or bedside chair)?: A Little Help needed to walk in hospital room?: A Little Help needed climbing 3-5 steps with a railing? : A Little 6 Click Score: 18    End of Session Equipment Utilized During Treatment: Oxygen Activity Tolerance: Patient tolerated treatment well Patient left: in chair;with call bell/phone within reach;with chair alarm set Nurse Communication: Mobility status PT Visit Diagnosis: Difficulty in walking, not elsewhere classified (R26.2)     Time: [id number] PT Time Calculation (min) (ACUTE ONLY): 20 min  Charges:  $Therapeutic Activity: 8-22 mins                     Erasmo Leventhal , PTA Acute Rehabilitation Services Pager [phone] Office [phone]    Avionna Bower Eli Hose 09/09/2020, 11:59 AM

## 2020-09-09 NOTE — Progress Notes (Signed)
Pharmacy Antibiotic Note  William Norman is a 80 y.o. male admitted on 08/28/2020 with  intra-abdominal infection .  Pharmacy has been consulted for zosyn dosing.  Pt with complicated case of intra-abdominal infection. He has received 5 days of zosyn post op. He continues to have leukocytosis. Repeat CT still showing large collection. Zosyn reordered tonight.   CrCl~97m/min  Plan: Zosyn 3.375g IV x1 then q8 over 4 hrs  Height: 6' (182.9 cm) Weight: 96.5 kg (212 lb 11.9 oz) IBW/kg (Calculated) : 77.6  Temp (24hrs), Avg:98.8 F (37.1 C), Min:98.3 F (36.8 C), Max:99.7 F (37.6 C)  Recent Labs  Lab 09/04/20 0300 09/05/20 0630 09/06/20 1051 09/07/20 0348 09/08/20 0314 09/09/20 0030  WBC 12.5*  --  15.3* 14.2* 16.3* 17.3*  CREATININE 0.76 0.74 1.36* 0.98  --  0.69    Estimated Creatinine Clearance: 88.8 mL/min (by C-G formula based on SCr of 0.69 mg/dL).    Allergies  Allergen Reactions   Other Other (See Comments)    Some form of numbing medication used by dentist, almost died   Simvastatin Other (See Comments)    Muscle aches     Antimicrobials this admission: CTX 7/16>7/18 Zosyn 7/20 >> (7/25) 7/27>>  Dose adjustments this admission:   Microbiology results: 7/15 ucx: mult spp 7/15 bcx: neg 7/16 bcx: neg 7/19: MRSA PCR negative  MOnnie Boer PharmD, BCIDP, AAHIVP, CPP Infectious Disease Pharmacist 09/09/2020 10:10 PM

## 2020-09-09 NOTE — Progress Notes (Signed)
Progress Note  7 Days Post-Op  Subjective: Had a large BM overnight, but NGT came out.  Frustrated with slowness of this process  Objective: Vital signs in last 24 hours: Temp:  [98.2 F (36.8 C)-99.7 F (37.6 C)] 99.7 F (37.6 C) (07/27 0724) Pulse Rate:  [84-109] 109 (07/27 0724) Resp:  [18-20] 19 (07/27 0724) BP: (131-146)/(70-82) 133/74 (07/27 0724) SpO2:  [94 %-97 %] 94 % (07/27 0724) Last BM Date: 09/08/20  Intake/Output from previous day: 07/26 0701 - 07/27 0700 In: 2875.1 [P.O.:50; I.V.:2825.1] Out: 3100 [Urine:2400; Emesis/NG output:700] Intake/Output this shift: Total I/O In: -  Out: 500 [Urine:500]  PE: Abd: soft, mild distention, but with a few BS  Midline incision with wound VAC in place .  Change today and will be called at change   Lab Results:  Recent Labs    09/08/20 0314 09/09/20 0030  WBC 16.3* 17.3*  HGB 10.5* 9.9*  HCT 34.1* 32.2*  PLT 249 277   BMET Recent Labs    09/07/20 0348 09/09/20 0030  NA 141 136  K 4.2 4.3  CL 111 110  CO2 24 23  GLUCOSE 108* 121*  BUN 35* 27*  CREATININE 0.98 0.69  CALCIUM 8.8* 8.9   PT/INR No results for input(s): LABPROT, INR in the last 72 hours. CMP     Component Value Date/Time   NA 136 09/09/2020 0030   NA 141 12/03/2019 1509   K 4.3 09/09/2020 0030   CL 110 09/09/2020 0030   CO2 23 09/09/2020 0030   GLUCOSE 121 (H) 09/09/2020 0030   GLUCOSE 94 01/10/2006 1108   BUN 27 (H) 09/09/2020 0030   BUN 10 12/03/2019 1509   CREATININE 0.69 09/09/2020 0030   CREATININE 0.84 09/30/2019 1349   CALCIUM 8.9 09/09/2020 0030   PROT 4.6 (L) 09/07/2020 0348   PROT 6.5 12/03/2019 1509   ALBUMIN 1.8 (L) 09/07/2020 0348   ALBUMIN 4.2 12/03/2019 1509   AST 39 09/07/2020 0348   ALT 33 09/07/2020 0348   ALKPHOS 58 09/07/2020 0348   BILITOT 0.8 09/07/2020 0348   BILITOT 0.9 12/03/2019 1509   GFRNONAA >60 09/09/2020 0030   GFRAA 97 12/03/2019 1509   Lipase     Component Value Date/Time   LIPASE 15  08/28/2020 1931       Studies/Results: No results found.  Anti-infectives: Anti-infectives (From admission, onward)    Start     Dose/Rate Route Frequency Ordered Stop   09/02/20 1845  piperacillin-tazobactam (ZOSYN) IVPB 3.375 g        3.375 g 12.5 mL/hr over 240 Minutes Intravenous Every 8 hours 09/02/20 1819 09/07/20 1359   09/02/20 1215  ceFAZolin (ANCEF) IVPB 2g/100 mL premix        2 g 200 mL/hr over 30 Minutes Intravenous  Once 09/02/20 1115 09/02/20 1320   08/29/20 0600  cefTRIAXone (ROCEPHIN) 1 g in sodium chloride 0.9 % 100 mL IVPB        1 g 200 mL/hr over 30 Minutes Intravenous Every 24 hours 08/29/20 0204 08/31/20 0730   08/28/20 2300  piperacillin-tazobactam (ZOSYN) IVPB 3.375 g        3.375 g 100 mL/hr over 30 Minutes Intravenous  Once 08/28/20 2245 08/28/20 2342        Assessment/Plan POD 7,  S/P exploratory laparotomy, extensive LOA, SBR x1 09/02/20 Dr. Georgette Dover for closed loop bowel obstruction - patient with continued ileus, although had a BM last night, but still had high NGT output prior  to it being removed. -given increasing WBC and limited gut function, will order CT scan today - continue TNA - goal K >4.0 and Mg >2.0 to help optimize bowel function (labs pending) - mobilize as tolerated, PT/OT  - VAC m/w/f - as of 7/22 wound measures 19.5 cm x 3.5 cm x 3 cm Hypokalemia -resolved yesterday   FEN - NPO, IVF @ 50 cc/h, TPN VTE - SCDs, heparin gtt  ID - Zosyn for 5 days post-op, closely follow WBC, CT pending   A. Fib on Eliquis - on hold  History of CVA January 2022 UTI - competed course of rocephin  Hypertension Hyperlipidemia  LOS: 42 days    William Norman, [facility] Surgery 09/09/2020, 10:55 AM Please see Amion for pager number during day hours 7:00am-4:30pm

## 2020-09-09 NOTE — Progress Notes (Signed)
ANTICOAGULATION CONSULT NOTE - Follow Up Consult  Pharmacy Consult for IV Heparin Indication: atrial fibrillation  Labs: Recent Labs    09/06/20 1051 09/07/20 0348 09/08/20 0314 09/08/20 1341 09/09/20 0030  HGB 12.7* 11.2* 10.5*  --  9.9*  HCT 39.4 35.5* 34.1*  --  32.2*  PLT 219 220 249  --  277  HEPARINUNFRC 0.59 0.51 >1.10* <0.10* 0.49  CREATININE 1.36* 0.98  --   --  0.69   Assessment: 80 yr old man on apixaban PTA for hx atrial fibrillation (last dose at 1200 on 7/15). Pharmacy was consulted to dose IV heparin while pt is NPO with SBO (pt currently on TPN).   Heparin level 0.49 units/ml  Goal of Therapy:  Heparin level 0.3-0.7 units/ml Monitor platelets by anticoagulation protocol: Yes   Plan:  Continue heparin infusion at 1750 units/hr Monitor daily heparin level, CBC Monitor for bleeding F/U transition back to apixaban when pt able to take PO medications Thanks for allowing pharmacy to be a part of this patient's care.  Excell Seltzer, PharmD Clinical Pharmacist  09/09/2020,1:49 AM

## 2020-09-09 NOTE — Progress Notes (Signed)
PROGRESS NOTE    William Norman  [id number] DOB: 04-Oct-1940 DOA: 08/28/2020 PCP: Vivi Barrack, MD   Chief Complain:Abdominal pain  Brief Narrative:  Patient is a 80 year old male with medical history significant for atrial fibrillation on Eliquis, coronary artery disease, history of CVA,, hypertension, perforated bowel in 2010 colostomy status post colostomy reversal in 2011 and small bowel obstruction 2016 that resolved with conservative management who presented with abdominal pain and was admitted on August 28, 2020.  Patient was found to have closed loop bowel obstruction.  General surgery consulted and he underwent expiratory laparotomy, extensive lysis evaluation, small bowel resection on 09/02/2020. Surgery is closely following.  Awaiting bowel movement   Assessment & Plan:   Principal Problem:   SBO (small bowel obstruction) (HCC) Active Problems:   Essential hypertension   Personal history of DVT (deep vein thrombosis)   Persistent atrial fibrillation (HCC)   CAD (coronary artery disease)   History of ischemic stroke   Recurrent UTI   Protein-calorie malnutrition, severe   1.Small bowel obstruction  Presented with abdominal pain Patient was found to have closed loop bowel obstruction.  General surgery consulted and he underwent expiratory laparotomy, extensive lysis evaluation, small bowel resection on 09/02/2020. Surgery is closely following. Continue TPN Continue NG tube with intermittent suction He was also on IV antibiotics, discontinued after 5 days course postop. Currently has a wound VAC on the abdominal surgical wound. No BM yet.  Denies passing flatus. His abdomen is more distended today, general surgery ordered CT abdomen  2.  Atrial fibrillation  rate controlled Eliquis is on hold  Continue IV heparin Will restart Eliquis when able to take p.o.  3.  Urinary tract infection  Patient was started on ceftriaxone and he has had 3 doses  4.   History of CVA  / coronary artery disease On statin, beta-blocker and Eliquis ,these are on hold due to current conditions  5.  Hypertension Monitor blood pressure.  Continue current medications  6.  Leukocytosis WBC trended up today.  Patient is afebrile.  Will check CT abd  Nutrition Problem: Severe Malnutrition Etiology: acute illness (SBO with no oral intake x 5 days)      DVT prophylaxis:Heparin iv Code Status: Full Family Communication: Called and discussed with the wife on phone on 7/27 Status is: Inpatient  Remains inpatient appropriate because:Persistent severe electrolyte disturbances and IV treatments appropriate due to intensity of illness or inability to take PO  Dispo: The patient is from: Home              Anticipated d/c is to: Home               Patient currently is not medically stable to d/c.   Difficult to place patient No   Consultants: Surgery  Procedures:As above  Antimicrobials:  Anti-infectives (From admission, onward)    Start     Dose/Rate Route Frequency Ordered Stop   09/02/20 1845  piperacillin-tazobactam (ZOSYN) IVPB 3.375 g        3.375 g 12.5 mL/hr over 240 Minutes Intravenous Every 8 hours 09/02/20 1819 09/07/20 1359   09/02/20 1215  ceFAZolin (ANCEF) IVPB 2g/100 mL premix        2 g 200 mL/hr over 30 Minutes Intravenous  Once 09/02/20 1115 09/02/20 1320   08/29/20 0600  cefTRIAXone (ROCEPHIN) 1 g in sodium chloride 0.9 % 100 mL IVPB        1 g 200 mL/hr over 30 Minutes Intravenous Every  24 hours 08/29/20 0204 08/31/20 0730   08/28/20 2300  piperacillin-tazobactam (ZOSYN) IVPB 3.375 g        3.375 g 100 mL/hr over 30 Minutes Intravenous  Once 08/28/20 2245 08/28/20 2342       Subjective:  Patient seen and examined at the bedside today.  He does not have any bowel movement yet, not passing gas.  Abdomen also appears little bit distended.  He accidentally removed his NG tube out.  Denies any nausea or vomiting.  Continues to  complain of abdominal pain but not worsened since yesterday   Objective: Vitals:   09/08/20 2019 09/08/20 2323 09/09/20 0521 09/09/20 0724  BP: (!) 145/82 131/76 131/70 133/74  Pulse: 90 85 88 (!) 109  Resp: '20 20 19 19  '$ Temp: 98.9 F (37.2 C) 98.9 F (37.2 C) 98.3 F (36.8 C) 99.7 F (37.6 C)  TempSrc: Oral Oral Oral Oral  SpO2: 97% 96% 97% 94%  Weight:      Height:        Intake/Output Summary (Last 24 hours) at 09/09/2020 1117 Last data filed at 09/09/2020 1019 Gross per 24 hour  Intake 2875.11 ml  Output 3600 ml  Net -724.89 ml   Filed Weights   08/29/20 0100 08/30/20 0501 09/01/20 0457  Weight: 97.1 kg 99.1 kg 96.5 kg    Examination:  General exam: Overall comfortable, not in distress, deconditioned, elderly male HEENT: PERRL Respiratory system:  no wheezes or crackles  Cardiovascular system: S1 & S2 heard, RRR.  Gastrointestinal system: Abdomen is distended, midline abdominal surgical wound with wound VAC.  No bowel sounds heard  Central nervous system: Alert and oriented Extremities: No edema, no clubbing ,no cyanosis Skin: No rashes, no ulcers,no icterus     Data Reviewed: I have personally reviewed following labs and imaging studies  CBC: Recent Labs  Lab 09/03/20 0500 09/04/20 0300 09/06/20 1051 09/07/20 0348 09/08/20 0314 09/09/20 0030  WBC 8.9 12.5* 15.3* 14.2* 16.3* 17.3*  NEUTROABS 6.9  --   --  11.2*  --   --   HGB 14.3 14.2 12.7* 11.2* 10.5* 9.9*  HCT 43.3 43.0 39.4 35.5* 34.1* 32.2*  MCV 93.1 92.7 93.1 94.4 95.3 95.0  PLT 131* 145* 219 220 249 [id number]   Basic Metabolic Panel: Recent Labs  Lab 09/04/20 0300 09/05/20 0630 09/06/20 1051 09/07/20 0348 09/09/20 0030  NA 142 141 139 141 136  K 3.1* 3.4* 4.1 4.2 4.3  CL 105 107 109 111 110  CO2 '31 28 23 24 23  '$ GLUCOSE 125* 106* 125* 108* 121*  BUN 20 23 35* 35* 27*  CREATININE 0.76 0.74 1.36* 0.98 0.69  CALCIUM 8.7* 8.8* 9.2 8.8* 8.9  MG 2.1 2.0 2.1 2.1 2.1  PHOS 2.6 2.2* 3.7 2.9 3.1    GFR: Estimated Creatinine Clearance: 88.8 mL/min (by C-G formula based on SCr of 0.69 mg/dL). Liver Function Tests: Recent Labs  Lab 09/03/20 0641 09/07/20 0348  AST 32 39  ALT 24 33  ALKPHOS 30* 58  BILITOT 1.0 0.8  PROT 4.8* 4.6*  ALBUMIN 2.5* 1.8*   No results for input(s): LIPASE, AMYLASE in the last 168 hours. No results for input(s): AMMONIA in the last 168 hours. Coagulation Profile: No results for input(s): INR, PROTIME in the last 168 hours. Cardiac Enzymes: No results for input(s): CKTOTAL, CKMB, CKMBINDEX, TROPONINI in the last 168 hours. BNP (last 3 results) No results for input(s): PROBNP in the last 8760 hours. HbA1C: No results for input(s): HGBA1C  in the last 72 hours. CBG: Recent Labs  Lab 09/08/20 0732 09/08/20 1148 09/08/20 1825 09/08/20 2320 09/09/20 0542  GLUCAP 118* 134* 110* 117* 122*   Lipid Profile: Recent Labs    09/07/20 0348  TRIG 74   Thyroid Function Tests: No results for input(s): TSH, T4TOTAL, FREET4, T3FREE, THYROIDAB in the last 72 hours. Anemia Panel: No results for input(s): VITAMINB12, FOLATE, FERRITIN, TIBC, IRON, RETICCTPCT in the last 72 hours. Sepsis Labs: No results for input(s): PROCALCITON, LATICACIDVEN in the last 168 hours.  Recent Results (from the past 240 hour(s))  Surgical pcr screen     Status: None   Collection Time: 09/01/20  8:09 AM   Specimen: Nasal Mucosa; Nasal Swab  Result Value Ref Range Status   MRSA, PCR NEGATIVE NEGATIVE Final   Staphylococcus aureus NEGATIVE NEGATIVE Final    Comment: (NOTE) The Xpert SA Assay (FDA approved for NASAL specimens in patients 38 years of age and older), is one component of a comprehensive surveillance program. It is not intended to diagnose infection nor to guide or monitor treatment. Performed at [facility], [place] [address], [place], [place] [address]          Radiology Studies: No results found.      Scheduled Meds:  Chlorhexidine  Gluconate Cloth  6 each Topical Daily   insulin aspart  0-15 Units Subcutaneous Q6H   lidocaine  1 patch Transdermal Q24H   pantoprazole (PROTONIX) IV  40 mg Intravenous Q24H   sodium chloride flush  10-40 mL Intracatheter Q12H   Continuous Infusions:  sodium chloride 10 mL/hr at 09/02/20 0645   heparin 1,800 Units/hr (09/09/20 1013)   methocarbamol (ROBAXIN) IV 1,000 mg (09/09/20 0541)   TPN ADULT (ION) 100 mL/hr at 09/08/20 1753   TPN ADULT (ION)       LOS: 11 days    Time spent: 25 mins.More than 50% of that time was spent in counseling and/or coordination of care.      Shelly Coss, MD [facility] P7/27/2022, 11:17 AM

## 2020-09-09 NOTE — Progress Notes (Signed)
ANTICOAGULATION CONSULT NOTE - Follow Up Consult  Pharmacy Consult for Heparin Indication: atrial fibrillation  Allergies  Allergen Reactions   Other Other (See Comments)    Some form of numbing medication used by dentist, almost died   Simvastatin Other (See Comments)    Muscle aches     Patient Measurements: Height: 6' (182.9 cm) Weight: 96.5 kg (212 lb 11.9 oz) IBW/kg (Calculated) : 77.6 Heparin Dosing Weight:    Vital Signs: Temp: 99.7 F (37.6 C) (07/27 0724) Temp Source: Oral (07/27 0724) BP: 133/74 (07/27 0724) Pulse Rate: 109 (07/27 0724)  Labs: Recent Labs    09/06/20 1051 09/07/20 0348 09/08/20 0314 09/08/20 1341 09/09/20 0030 09/09/20 0830  HGB 12.7* 11.2* 10.5*  --  9.9*  --   HCT 39.4 35.5* 34.1*  --  32.2*  --   PLT 219 220 249  --  277  --   HEPARINUNFRC 0.59 0.51 >1.10* <0.10* 0.49 0.31  CREATININE 1.36* 0.98  --   --  0.69  --     Estimated Creatinine Clearance: 88.8 mL/min (by C-G formula based on SCr of 0.69 mg/dL).   Assessment: Anticoag: PTA apix - hx Afib (LD 7/15'@1200'$ ) > heparin gtt while NPO with SBO. HL 0.49>0.31 low normal. Hgb down to 9.9. Plts 2277 WNL. - CHADSVASC 6   Goal of Therapy:  Heparin level 0.3-0.7 units/ml Monitor platelets by anticoagulation protocol: Yes   Plan:  Increase IV heparin to 1800 units/hr to prevent falling below goal Daily HL and CBC   William Norman S. Alford Highland, PharmD, BCPS Clinical Staff Pharmacist [email or web address]  Alford Highland, [employer] 09/09/2020,9:40 AM

## 2020-09-09 NOTE — Progress Notes (Signed)
    OVERNIGHT PROGRESS REPORT  Notified by RN/Radiology for information on CT. Heparin drip is stopped at this time (2200)  RN vitals : (2200 hrs) BP 134/77 T99.7 PR85 RR18 97%RA. "Pt is alert and verbal with still c/o mild tenderness to abdomen"   CT: "Stomach/Bowel: Small hiatal hernia. The stomach is within normal limits. There is no evidence of bowel obstruction. Evidence of small bowel resection. Oral contrast material is noted throughout the small bowel and within the ascending and transverse colon. There is no evidence of contrast leak the appendix is normal. There is extensive postsurgical inflammatory change in the mid to lower central abdomen and anterior pelvis. In the anterior pelvis, there is a heterogeneous collection, on the right side of this collection there is low-density fluid with a fluid fluid level layering posteriorly with more dense material, and the left anterior pelvis there is a or heterogeneous higher density appearance. This entire area measures approximately 18.0 x 6.8 cm."   Currently: monitoring for hemodynamic instability.  If the hgb drops preciptiously or below 7 we will transfuse as needed otherwise,  conservative management.  We will call surgery promptly if he has a change in clinical status  Labs CMP HH PT/INR are in progress.    Gershon Cull MSNA MSN ACNPC-AG Acute Care Nurse Practitioner [place]

## 2020-09-09 NOTE — Progress Notes (Signed)
Adhikari,MD secure chat this writer to stop Heparin Drip STAT d/t intraabd bleed. Writer stopped Heparin and VS was checked.  BP 134/77 T99.7 PR85 RR18 97%RA. Pt is alert and verbal with still c/o mild tenderness to abdomen. J Daniels,NP made aware and updated abt patient's status. STAT CBC and CMP ordered.   Call bell witihin reach and will continue to monitor.

## 2020-09-09 NOTE — Progress Notes (Signed)
PHARMACY - TOTAL PARENTERAL NUTRITION CONSULT NOTE  Indication: SBO  Patient Measurements: Height: 6' (182.9 cm) Weight: 96.5 kg (212 lb 11.9 oz) IBW/kg (Calculated) : 77.6 TPN AdjBW (KG): 82.8 Body mass index is 28.85 kg/m. Usual Weight: 97-100 kg  Assessment:  79 YOM presented on 7/15 with acute abdominal pain. CT showed inflamed loop of small bowel LEFT upper quadrant with potential closed loop anatomy. This could progress to high-grade obstruction. Initially treated with conservative management, but abdominal xray shows progressive SB distention consistent with SBO.  Pt denies recent weight loss. - PMH significant for GERD, bowel perforation in 2010, colostomy reversal in 2011 and SBO in 2016. CVA 02/2020, HTN, HLD.  Glucose / Insulin: no hx DM. CBGs 110-134. SSI 4 units/24 hrs. - A1c 5.9 in June 2022  Electrolytes: K 4.3, CoCa: 10.7 slightly elevated, Mg/Phos WNL - [id number] product <55.   - goal K >4.0 and Mg >2.0 to help optimize bowel function  Renal: SCr <1. Good UOP.  Hepatic: LFTs WNL. TG74. Albumin 1.8. Prealbumin 14.5>8.7 post-op  Intake / Output; MIVF: UOP 1 ml/kg/hr=243m. NG 700 ml, now out. LBM 7/26 after supp. Wound vac: none noted  GI: IV PPI/24h. Ileus slowly improving. Now with leukocytosis and low grade fever. May need abdominal CT.  GI Imaging:  - 7/24 Abd x-ray: ordered   GI Surgeries / Procedures:   - 7/20 ex-lap: SBO with closed loop obstruction in LUQ, small bowel resection (~40 cm), lysis of adhesions  Central access: 09/02/20 TPN start date: 09/02/20  Nutritional Goals (RD recommendation 7/21): 2200-2400 kCal, 125-155g AA per day, fluid >/= 2.2 L  Current Nutrition:  NPO TPN - goal rate 1029mhr to prove 139g AA and 2314 kcal   Plan:  CT abdomen? No change to TPN today. Continue TPN at 100 ml/hr - this will provide 139 g AA and 2313 kcal meeting 100% of needs  Electrolytes in TPN: removed Ca. Continue Na 5019mL, K 50 mEq/L, Mg 7.5 mEq/L,  Phos 25 mmol/L. Continue Cl:[id number]. Add standard MVI and trace elements to TPN Change moderate SSI and CBGs to q6hrs. Standard TPN labs Monday/Thursday and PRN. BMET/Mg/Phos tomorrow Thiamine '100mg'$  x7 days in TPN per RD recommendation (ends 7/27)   Khush Pasion S. RobAlford HighlandharmD, BCPS Clinical Staff Pharmacist [email or web address] 09/09/2020, 7:38 AM

## 2020-09-10 ENCOUNTER — Inpatient Hospital Stay (HOSPITAL_COMMUNITY): Payer: Medicare HMO

## 2020-09-10 DIAGNOSIS — K56609 Unspecified intestinal obstruction, unspecified as to partial versus complete obstruction: Secondary | ICD-10-CM | POA: Diagnosis not present

## 2020-09-10 LAB — COMPREHENSIVE METABOLIC PANEL
ALT: 95 U/L — ABNORMAL HIGH (ref 0–44)
ALT: 89 U/L — ABNORMAL HIGH (ref 0–44)
AST: 77 U/L — ABNORMAL HIGH (ref 15–41)
AST: 64 U/L — ABNORMAL HIGH (ref 15–41)
Albumin: 1.8 g/dL — ABNORMAL LOW (ref 3.5–5.0)
Albumin: 1.8 g/dL — ABNORMAL LOW (ref 3.5–5.0)
Alkaline Phosphatase: 89 U/L (ref 38–126)
Alkaline Phosphatase: 93 U/L (ref 38–126)
Anion gap: 4 — ABNORMAL LOW (ref 5–15)
Anion gap: 4 — ABNORMAL LOW (ref 5–15)
BUN: 28 mg/dL — ABNORMAL HIGH (ref 8–23)
BUN: 27 mg/dL — ABNORMAL HIGH (ref 8–23)
CO2: 22 mmol/L (ref 22–32)
CO2: 22 mmol/L (ref 22–32)
Calcium: 8.7 mg/dL — ABNORMAL LOW (ref 8.9–10.3)
Calcium: 8.7 mg/dL — ABNORMAL LOW (ref 8.9–10.3)
Chloride: 106 mmol/L (ref 98–111)
Chloride: 107 mmol/L (ref 98–111)
Creatinine, Ser: 0.69 mg/dL (ref 0.61–1.24)
Creatinine, Ser: 0.72 mg/dL (ref 0.61–1.24)
GFR, Estimated: >60 mL/min (ref >60)
GFR, Estimated: >60 mL/min (ref >60)
Glucose, Bld: 113 mg/dL — ABNORMAL HIGH (ref 70–99)
Glucose, Bld: 118 mg/dL — ABNORMAL HIGH (ref 70–99)
Potassium: 4.5 mmol/L (ref 3.5–5.1)
Potassium: 4.5 mmol/L (ref 3.5–5.1)
Sodium: 132 mmol/L — ABNORMAL LOW (ref 135–145)
Sodium: 133 mmol/L — ABNORMAL LOW (ref 135–145)
Total Bilirubin: 0.8 mg/dL (ref 0.3–1.2)
Total Bilirubin: 0.8 mg/dL (ref 0.3–1.2)
Total Protein: 4.9 g/dL — ABNORMAL LOW (ref 6.5–8.1)
Total Protein: 4.7 g/dL — ABNORMAL LOW (ref 6.5–8.1)

## 2020-09-10 LAB — CBC
HCT: 30.6 % — ABNORMAL LOW (ref 39.0–52.0)
HCT: 29.5 % — ABNORMAL LOW (ref 39.0–52.0)
Hemoglobin: 9.6 g/dL — ABNORMAL LOW (ref 13.0–17.0)
Hemoglobin: 9.3 g/dL — ABNORMAL LOW (ref 13.0–17.0)
MCH: 29.7 pg (ref 26.0–34.0)
MCH: 29.9 pg (ref 26.0–34.0)
MCHC: 31.4 g/dL (ref 30.0–36.0)
MCHC: 31.5 g/dL (ref 30.0–36.0)
MCV: 94.7 fL (ref 80.0–100.0)
MCV: 94.9 fL (ref 80.0–100.0)
Platelets: 281 10*3/uL (ref 150–400)
Platelets: 274 10*3/uL (ref 150–400)
RBC: 3.23 MIL/uL — ABNORMAL LOW (ref 4.22–5.81)
RBC: 3.11 MIL/uL — ABNORMAL LOW (ref 4.22–5.81)
RDW: 15 % (ref 11.5–15.5)
RDW: 15.1 % (ref 11.5–15.5)
WBC: 18.3 10*3/uL — ABNORMAL HIGH (ref 4.0–10.5)
WBC: 17.8 10*3/uL — ABNORMAL HIGH (ref 4.0–10.5)
nRBC: 0.2 % (ref 0.0–0.2)
nRBC: 0.1 % (ref 0.0–0.2)

## 2020-09-10 LAB — HEPARIN LEVEL (UNFRACTIONATED): Heparin Unfractionated: <0.1 IU/mL — ABNORMAL LOW (ref 0.30–0.70)

## 2020-09-10 LAB — GLUCOSE, CAPILLARY
Glucose-Capillary: 118 mg/dL — ABNORMAL HIGH (ref 70–99)
Glucose-Capillary: 126 mg/dL — ABNORMAL HIGH (ref 70–99)
Glucose-Capillary: 126 mg/dL — ABNORMAL HIGH (ref 70–99)
Glucose-Capillary: 135 mg/dL — ABNORMAL HIGH (ref 70–99)

## 2020-09-10 LAB — TYPE AND SCREEN
ABO/RH(D): A POS
Antibody Screen: NEGATIVE

## 2020-09-10 LAB — PROTIME-INR
INR: 1.1 (ref 0.8–1.2)
Prothrombin Time: 13.9 seconds (ref 11.4–15.2)

## 2020-09-10 LAB — PHOSPHORUS: Phosphorus: 3.3 mg/dL (ref 2.5–4.6)

## 2020-09-10 LAB — MAGNESIUM: Magnesium: 2 mg/dL (ref 1.7–2.4)

## 2020-09-10 IMAGING — CT CT GUIDANCE NEEDLE PLACEMENT
1 of 2 series · 15 of 32 positions shown, 19 images · non-contrast
Comparison: none

INDICATION: 79-year-old with postoperative fluid collections in the anterior
pelvis.

[Series 2: i-spiral 5.0 b40f · axial · 0.91mm/px · z∈[-239,-46]mm · 15 of 59 slices shown, 19 images]
[im 3/59  soft-tissue]
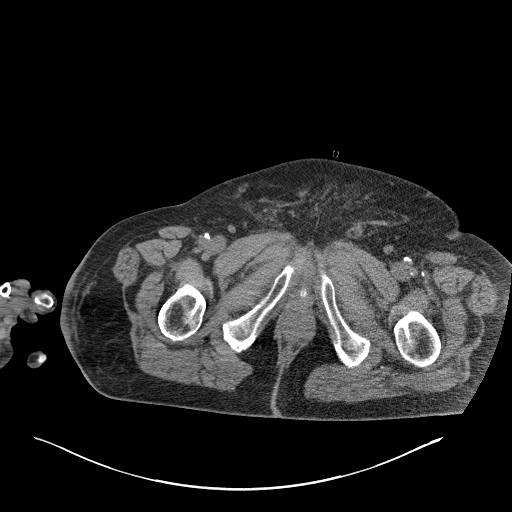
[im 3/59  bone]
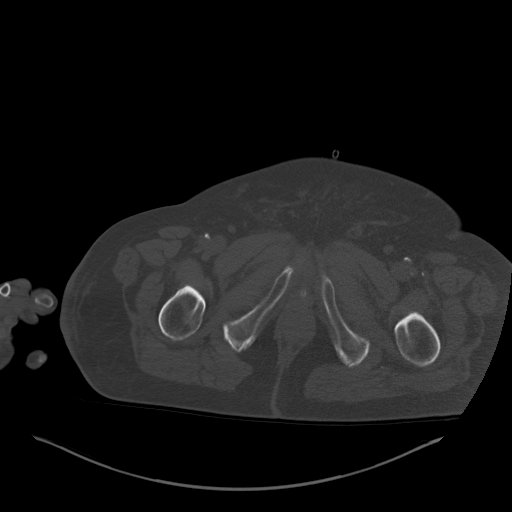
[im 7/59  soft-tissue]
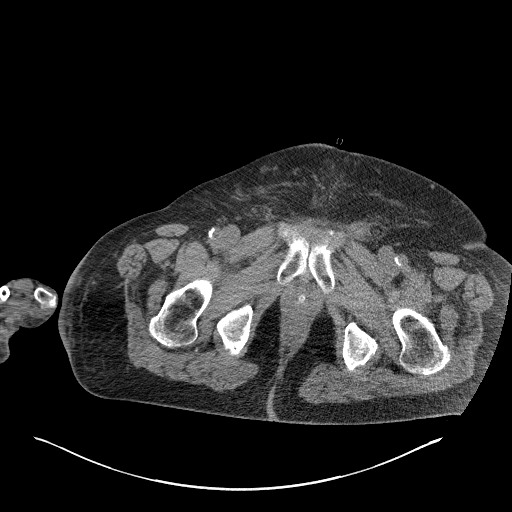
[im 12/59  soft-tissue]
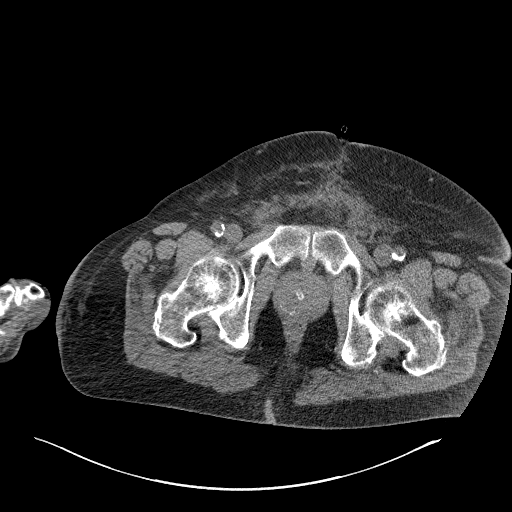
[im 17/59  soft-tissue]
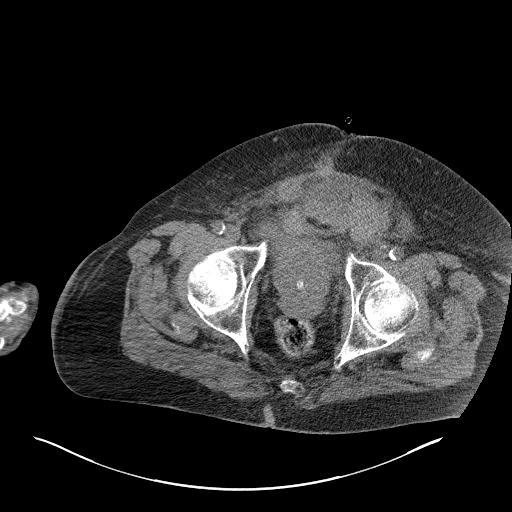
[im 21/59  soft-tissue]
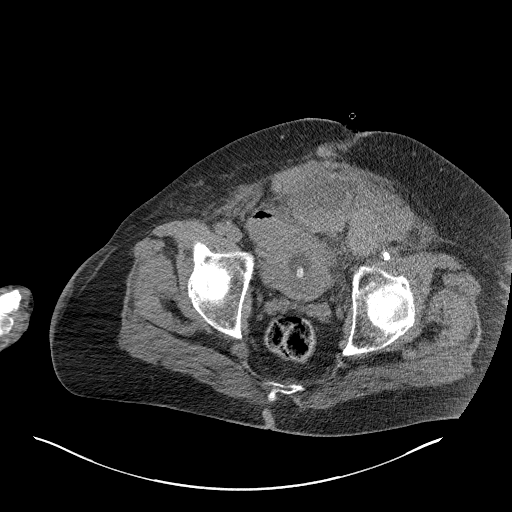
[im 26/59  soft-tissue]
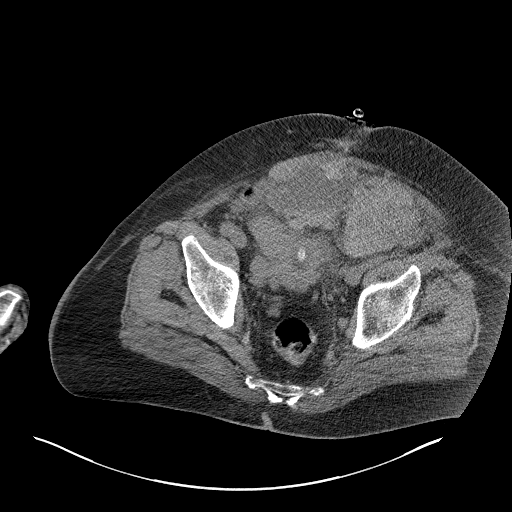
[im 31/59  soft-tissue]
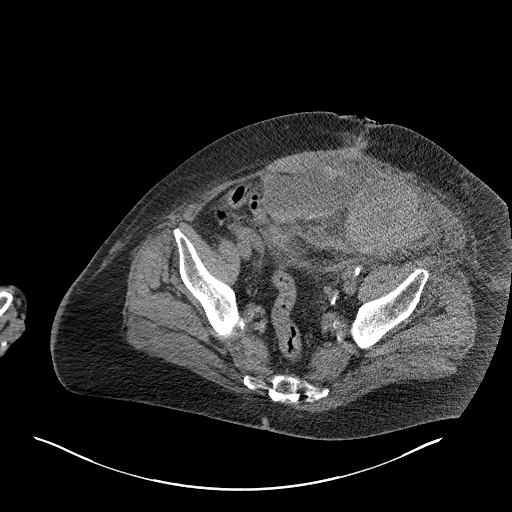
[im 33/59  soft-tissue]
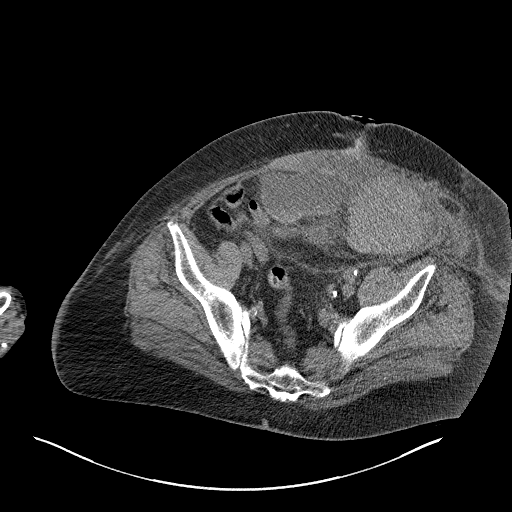
[im 38/59  soft-tissue]
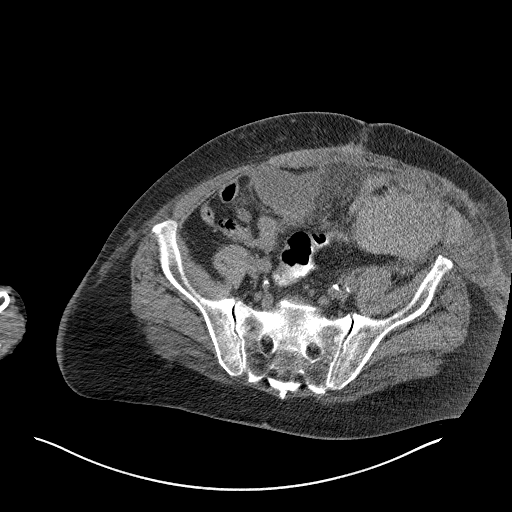
[im 38/59  bone]
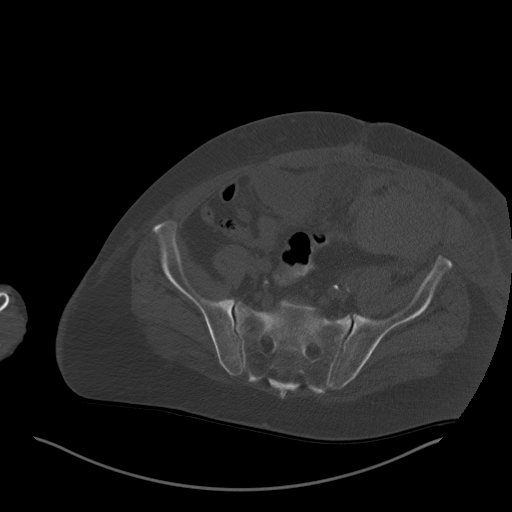
[im 42/59  soft-tissue]
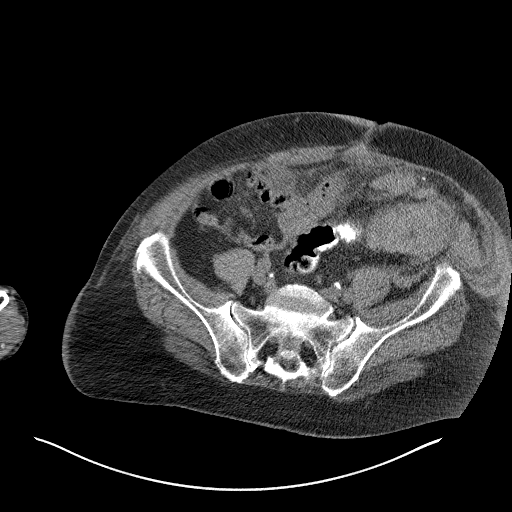
[im 47/59  soft-tissue]
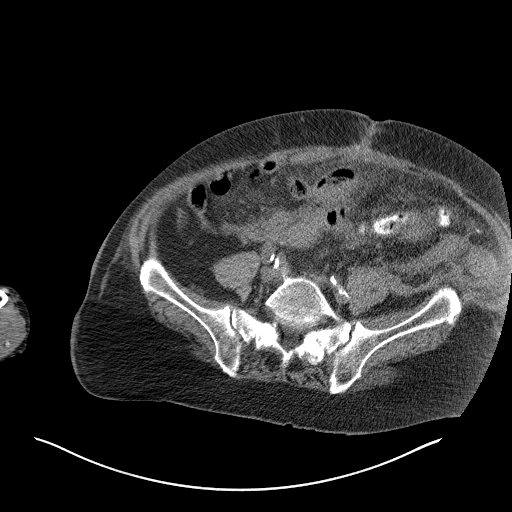
[im 49/59  lung]
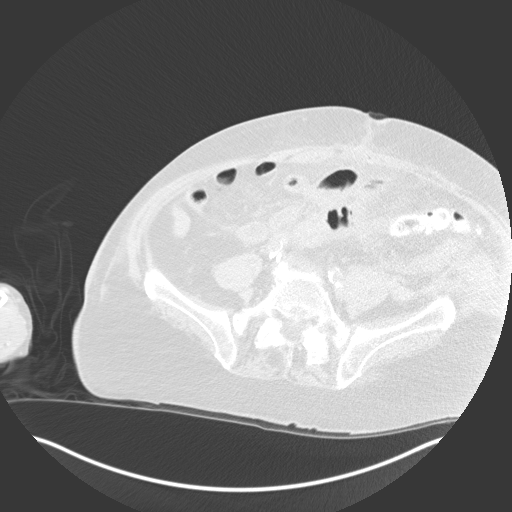
[im 52/59  soft-tissue]
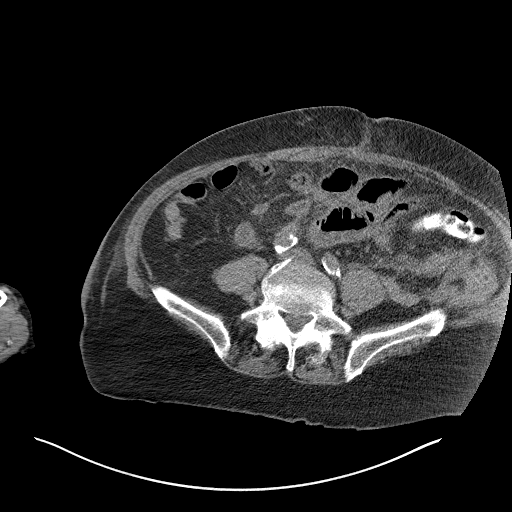
[im 52/59  lung]
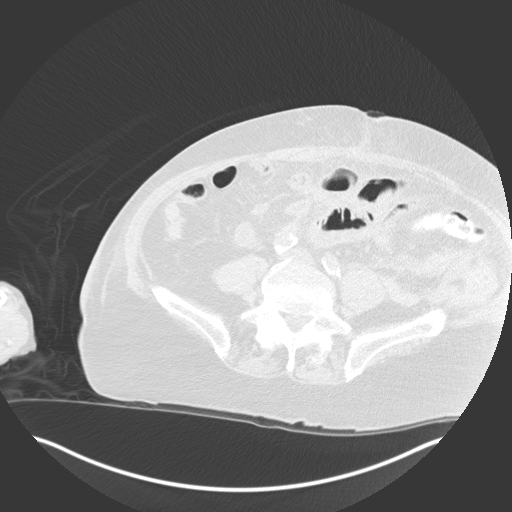
[im 54/59  lung]
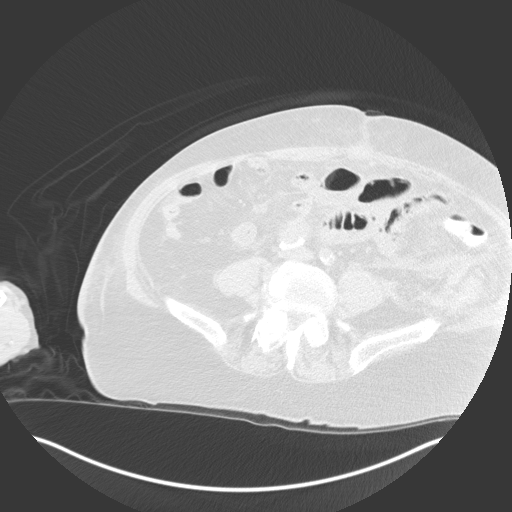
[im 56/59  soft-tissue]
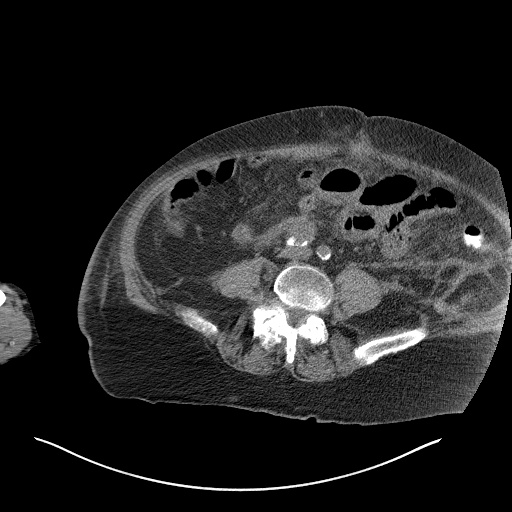
[im 56/59  lung]
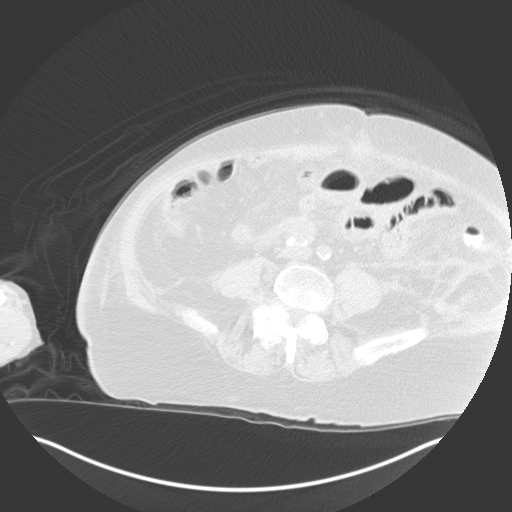

[15 of 32 positions shown; findings below may reference images not displayed]

EXAM:
CT-GUIDED ASPIRATION OF PELVIC FLUID COLLECTION

MEDICATIONS:
Moderate sedation

ANESTHESIA/SEDATION:
Fentanyl 100 mcg IV; Versed 2.0 mg IV

Moderate Sedation Time:  12 minutes

The patient was continuously monitored during the procedure by the
interventional radiology nurse under my direct supervision.

COMPLICATIONS:
None immediate.

PROCEDURE:
Informed written consent was obtained from the patient after a
thorough discussion of the procedural risks, benefits and
alternatives. All questions were addressed. A timeout was performed
prior to the initiation of the procedure.

Patient was placed supine. CT images through the lower abdomen and
pelvis were obtained. Low density collection in the anterior pelvis
just right of midline was targeted. The right side of the lower
abdomen and pelvis was prepped with chlorhexidine and sterile field
was created. Skin was anesthetized with 1% lidocaine. Small incision
was made. Using CT guidance, an 18 gauge trocar needle was directed
into the anterior right pelvic collection and approximately 90 mL of
dark red fluid was removed. Follow up CT images were obtained.
Needle was removed. Bandage placed over the puncture site.
FINDINGS: Low-density collection in the anterior pelvis at midline and just
right of midline. Hyperdense collection along the anterior left side
of the pelvis compatible with a hematoma. Needle was directed into
the low-density right-sided collection. 90 mL of bloody fluid was
removed. Collection was partially decompressed at the end of the
procedure.
IMPRESSION: CT-guided aspiration of the anterior pelvic fluid collection.
Findings are compatible with liquefied hematoma. Fluid was sent for
culture.

## 2020-09-10 MED ORDER — MIDAZOLAM HCL 2 MG/2ML IJ SOLN
INTRAMUSCULAR | Status: AC | PRN
  Administered 2020-09-10 (×2): 1 mg via INTRAVENOUS

## 2020-09-10 MED ORDER — MIDAZOLAM HCL 2 MG/2ML IJ SOLN
INTRAMUSCULAR | Status: AC
  Filled 2020-09-10: qty 4

## 2020-09-10 MED ORDER — TRAVASOL 10 % IV SOLN
INTRAVENOUS | Status: AC
  Filled 2020-09-10: qty 1392

## 2020-09-10 MED ORDER — FENTANYL CITRATE (PF) 100 MCG/2ML IJ SOLN
INTRAMUSCULAR | Status: AC
  Filled 2020-09-10: qty 2

## 2020-09-10 MED ORDER — FENTANYL CITRATE (PF) 100 MCG/2ML IJ SOLN
INTRAMUSCULAR | Status: AC | PRN
  Administered 2020-09-10 (×2): 25 ug via INTRAVENOUS
  Administered 2020-09-10: 50 ug via INTRAVENOUS

## 2020-09-10 NOTE — Progress Notes (Signed)
Nutrition Follow-up  DOCUMENTATION CODES:   Severe malnutrition in context of acute illness/injury  INTERVENTION:   TPN to meet nutritional needs -Recommend continuing TPN until pt advanced to Soft diet, tolerating po and eating at least 50% of meals   NUTRITION DIAGNOSIS:   Severe Malnutrition related to acute illness (SBO with no oral intake x 5 days) as evidenced by moderate fat depletion, moderate muscle depletion, energy intake < or equal to 50% for > or equal to 5 days.  Being addressed via nutrition support  GOAL:   Patient will meet greater than or equal to 90% of their needs  Progressing  MONITOR:   I & O's, Diet advancement, Labs  REASON FOR ASSESSMENT:   Consult New TPN/TNA  ASSESSMENT:   80 yo male admitted with small bowel obstruction. S/P ex lap with LOA, small bowel resection, VAC placement on 7/20. PMH includes perforated diverticulitis, CAD, HLD, HTN, BPH, GERD, A fib, stroke.  NPO, possible trial of CL today  TPN at 100 ml/hr, tolerating  Last weight 7/19; need new weight  Labs: reviewed Meds: ss novolog   Diet Order:   Diet Order             Diet clear liquid Room service appropriate? Yes; Fluid consistency: Thin  Diet effective now                   EDUCATION NEEDS:   Not appropriate for education at this time  Skin:  Skin Assessment: Skin Integrity Issues: Skin Integrity Issues:: Wound VAC Wound Vac: surgical incision to abdomen  Last BM:  7/26  Height:   Ht Readings from Last 1 Encounters:  08/28/20 6' (1.829 m)    Weight:   Wt Readings from Last 1 Encounters:  09/01/20 96.5 kg    Ideal Body Weight:  80.9 kg  BMI:  Body mass index is 28.85 kg/m.  Estimated Nutritional Needs:   Kcal:  2200-2400  Protein:  125-150 gm  Fluid:  >/= 2.2 L   Kerman Passey MS, RDN, LDN, CNSC Registered Dietitian III Clinical Nutrition RD Pager and On-Call Pager Number Located in [employer]

## 2020-09-10 NOTE — Consult Note (Signed)
Chief Complaint: Patient was seen in consultation today for CT-guided aspiration/possible drainage of anterior pelvic fluid collection Chief Complaint  Patient presents with   Abdominal Pain     Referring Physician(s): Cornett,T  Supervising Physician: Markus Daft  Patient Status: [facility] - In-pt  History of Present Illness: William Norman is an 80 y.o. male with past medical history of BPH, coronary artery disease with prior angioplasty, chronic venous insufficiency, prior DVT, dyslipidemia, hypertension, GERD, hyperlipidemia, atrial fibrillation, and prior stroke.  He also has a history of perforated diverticulitis requiring Hartman's procedure in 2010 which has since been reversed and diverted with reversal of his loop ileostomy and an incision that resulted in a hernia repair with Strattice.  He recently presented with small bowel obstruction secondary to adhesive disease and underwent exploratory lap with lysis of adhesions, small bowel resection and placement of vacuum dressing on 09/02/2020.  Due to recent abdominal pain, fever, cytosis and prolonged postop ileus patient underwent CT abdomen pelvis 7/27 which revealed:   Large heterogeneous collection in the anterior pelvis measuring 18.0 x 6.8 cm in the axial dimension, favored to represent a hematoma with varying ages of blood products. Given the patient's leukocytosis, an infected hematoma is possible.   Recent small-bowel resection with extensive postsurgical inflammatory changes in the central mid abdomen and pelvis. No evidence of anastomotic leak. No evidence of bowel obstruction.    He is currently afebrile, WBC 17.8, hemoglobin 9.3, platelets normal, creatinine normal ,PT/INR normal.  Request now received from surgical team for image guided aspiration /possible drainage of anterior pelvic fluid collection/? hematoma.  Past Medical History:  Diagnosis Date   BPH (benign prostatic hypertrophy)    BPH associated  with nocturia 08/29/2013   BPH with obstruction/lower urinary tract symptoms 08/14/2019   CAD (coronary artery disease)    CAD (coronary artery disease), native coronary artery    PTCA of RCA 1986, PTCA of LAD 1992,  Negative treadmill Cardiolite 2011    Chronic dermatitis of hands 10/12/2015   Chronic venous insufficiency    Coagulation disorder ([place]) 10/26/2018   DVT (deep venous thrombosis) (HCC)    Dyslipidemia    Eczema    Erectile dysfunction 07/11/2012   Essential hypertension    GERD 08/04/2006       GERD (gastroesophageal reflux disease)    Hyperglycemia 07/08/2019   Hyperlipidemia    Hypertension    Incomplete emptying of bladder 08/14/2019   Long term current use of anticoagulant therapy    Lumbar disc disease    Overweight (BMI 25.0-29.9) 06/18/2015   Persistent atrial fibrillation ([place]) 04/10/2018   Personal history of DVT (deep vein thrombosis)    Initially in 1998 with recurrence in 2002 now on chronic warfarin    Soft tissue lesion of foot 08/27/2015   Stroke [facility])    Urinary tract infection with hematuria 08/14/2019    Past Surgical History:  Procedure Laterality Date   APPLICATION OF WOUND VAC  09/02/2020   Procedure: APPLICATION OF WOUND VAC;  Surgeon: Donnie Mesa, MD;  Location: [place];  Service: General;;   CARDIAC CATHETERIZATION     CARPAL TUNNEL RELEASE  04/21/2011   Procedure: CARPAL TUNNEL RELEASE;  Surgeon: Tennis Must, MD;  Location: [place];  Service: Orthopedics;  Laterality: Left;   CARPAL TUNNEL RELEASE  05/23/2011   Procedure: CARPAL TUNNEL RELEASE;  Surgeon: Tennis Must, MD;  Location: [place];  Service: Orthopedics;  Laterality: Right;   COLON SURGERY  2010 and 2011 colostomy reversal   CORONARY ANGIOPLASTY     EYE SURGERY     repair of macular hole   LAPAROTOMY N/A 09/02/2020   Procedure: EXPLORATORY LAPAROTOMY, LYSIS OF ADHESIONS ,BOWEL RESECTION;  Surgeon: Donnie Mesa, MD;  Location: [place];   Service: General;  Laterality: N/A;   NASAL SEPTUM SURGERY     precutaneous transluminal coronary angioplasty     right hernia     TONSILLECTOMY AND ADENOIDECTOMY      Allergies: Other and Simvastatin  Medications: Prior to Admission medications   Medication Sig Start Date End Date Taking? Authorizing Provider  acetaminophen (TYLENOL) 650 MG CR tablet Take 650 mg by mouth daily as needed for pain.   Yes [provider]  amLODipine (NORVASC) 5 MG tablet TAKE 1 TABLET BY MOUTH EVERY OTHER DAY Patient taking differently: Take 5 mg by mouth every other day. 01/19/20  Yes Vivi Barrack, MD  apixaban (ELIQUIS) 5 MG TABS tablet Take 1 tablet (5 mg total) by mouth 2 (two) times daily. 02/17/20  Yes Danford, Suann Larry, MD  atenolol (TENORMIN) 100 MG tablet TAKE 1 TABLET BY MOUTH EVERY DAY Patient taking differently: Take 100 mg by mouth every morning. 08/07/20  Yes Vivi Barrack, MD  atorvastatin (LIPITOR) 40 MG tablet TAKE 1/2 TABLET BY MOUTH DAILY Patient taking differently: Take 20 mg by mouth at bedtime. 07/03/20  Yes Vivi Barrack, MD  Coenzyme Q10 (COQ10) 200 MG CAPS Take 1 capsule by mouth daily. Patient taking differently: Take 200 mg by mouth daily. 07/23/20  Yes Vivi Barrack, MD  Cyanocobalamin (VITAMIN B-12) 1000 MCG SUBL Place 1 tablet (1,000 mcg total) under the tongue daily. 07/24/20  Yes Vivi Barrack, MD  hydroxypropyl methylcellulose / hypromellose (ISOPTO TEARS / GONIOVISC) 2.5 % ophthalmic solution Place 1 drop into both eyes 2 (two) times daily as needed for dry eyes.   Yes [provider]  lisinopril (ZESTRIL) 40 MG tablet TAKE 1 TABLET BY MOUTH EVERY DAY Patient taking differently: Take 40 mg by mouth daily. 07/14/20  Yes Vivi Barrack, MD  MELATONIN PO Take 1 tablet by mouth at bedtime.   Yes [provider]  nitroGLYCERIN (NITROSTAT) 0.4 MG SL tablet Dissolve 1 tablet under the tongue every 5 minutes as needed Patient taking differently:  Place 0.4 mg under the tongue every 5 (five) minutes as needed for chest pain. 01/19/17  Yes Vivi Barrack, MD  oxymetazoline (AFRIN) 0.05 % nasal spray Place 2 sprays into both nostrils at bedtime.   Yes [provider]  tamsulosin (FLOMAX) 0.4 MG CAPS capsule TAKE 1 CAPSULE (0.4 MG TOTAL) BY MOUTH IN THE MORNING AND AT BEDTIME. 09/09/20   Vivi Barrack, MD     Family History  Problem Relation Age of Onset   Heart disease Father    Heart attack Father    Atrial fibrillation Father    Heart disease Mother    Atrial fibrillation Mother    Stroke Mother    AAA (abdominal aortic aneurysm) Brother     Social History   Socioeconomic History   Marital status: Married    Spouse name: Not on file   Number of children: 3   Years of education: 16   Highest education level: Not on file  Occupational History   Occupation: [employer]  Tobacco Use   Smoking status: Former    Packs/day: 1.00    Years: 60.00    Pack years: 60.00  Types: Cigarettes    Quit date: 03/25/1998    Years since quitting: 22.4   Smokeless tobacco: Never  Vaping Use   Vaping Use: Never used  Substance and Sexual Activity   Alcohol use: Yes    Alcohol/week: 2.0 - 3.0 standard drinks    Types: 2 - 3 Glasses of wine per week    Comment: 1 glass of red wine every 2-3 night    Drug use: No   Sexual activity: Not on file  Other Topics Concern   Not on file  Social History Narrative   Fun: Working outside in yard, cutting wood    Denies abuse and feels safe at home.    Right handed   Lives at home with wife, daughter, granddaughter, and son (periodically)   Social Determinants of Health   Financial Resource Strain: Not on file  Food Insecurity: Not on file  Transportation Needs: Not on file  Physical Activity: Not on file  Stress: Not on file  Social Connections: Not on file     Review of Systems currently denies fever, headache, chest pain, dyspnea, back pain, nausea, vomiting, bleeding; he does  have occasional cough and continues to have primarily central abdominal discomfort  Vital Signs: BP 123/67 (BP Location: Left Arm)   Pulse 76   Temp 98.4 F (36.9 C) (Oral)   Resp 18   Ht 6' (1.829 m)   Wt 212 lb 11.9 oz (96.5 kg)   SpO2 95%   BMI 28.85 kg/m   Physical Exam awake, alert.  Chest with slightly diminished breath sounds bases.  Heart with irregularly irregular rhythm.  Abdomen soft, mildly distended, midline wound VAC in place, some midline tenderness to palpation; pretibial edema noted bilaterally.  Imaging: DG Abd 1 View  Result Date: 08/30/2020 CLINICAL DATA:  80 year old male status post nasogastric tube placement. EXAM: ABDOMEN - 1 VIEW COMPARISON:  08/29/2020. FINDINGS: Nasogastric tube extends into the proximal stomach with side port at or immediately distal to the gastroesophageal junction. Visualized bowel gas pattern is unremarkable. Visualized portions of the lungs suggest a background of interstitial pulmonary edema. Mild cardiomegaly. IMPRESSION: 1. Tip of nasogastric tube is in the proximal stomach and could be advanced approximately 10 cm for more optimal placement. 2. Chest is incompletely imaged, but visualized portions suggests underlying congestive heart failure. Follow-up chest radiograph is recommended. Electronically Signed   By: [name] M.D.   On: 08/30/2020 07:50   CT ABDOMEN PELVIS W CONTRAST  Result Date: 09/09/2020 CLINICAL DATA:  Abdominal pain, fever, post-op leukocytosis, prolonged post op ileus EXAM: CT ABDOMEN AND PELVIS WITH CONTRAST TECHNIQUE: Multidetector CT imaging of the abdomen and pelvis was performed using the standard protocol following bolus administration of intravenous contrast. CONTRAST:  177m OMNIPAQUE IOHEXOL 300 MG/ML  SOLN COMPARISON:  CT 08/28/2020 FINDINGS: Lower chest: Bibasilar atelectasis, small left and trace right effusions. Mild paraseptal emphysema. Coronary artery calcifications. Prominent left atrium.  Hepatobiliary: No focal liver abnormality is seen. Cholelithiasis no evidence of cholecystitis. Pancreas: Unremarkable. No pancreatic ductal dilatation or surrounding inflammatory changes. Spleen: Normal in size without focal abnormality. Adrenals/Urinary Tract: Adrenal glands are unremarkable. No hydronephrosis. Unchanged right renal cyst. The bladder is decompressed with Foley catheter in place in with intraluminal gas. Stomach/Bowel: Small hiatal hernia. The stomach is within normal limits. There is no evidence of bowel obstruction. Evidence of small bowel resection. Oral contrast material is noted throughout the small bowel and within the ascending and transverse colon. There is no evidence  of contrast leak the appendix is normal. There is extensive postsurgical inflammatory change in the mid to lower central abdomen and anterior pelvis. In the anterior pelvis, there is a heterogeneous collection, on the right side of this collection there is low-density fluid with a fluid fluid level layering posteriorly with more dense material, and the left anterior pelvis there is a or heterogeneous higher density appearance. This entire area measures approximately 18.0 x 6.8 cm. Vascular/Lymphatic: Aortoiliac atherosclerotic calcifications. No AAA. No lymphadenopathy. Reproductive: Mildly enlarged prostate. Other: Prior midline incision. Musculoskeletal: No acute osseous abnormality. No suspicious lytic or blastic lesions. Multilevel degenerative changes of the spine. IMPRESSION: Large heterogeneous collection in the anterior pelvis measuring 18.0 x 6.8 cm in the axial dimension, favored to represent a hematoma with varying ages of blood products. Given the patient's leukocytosis, an infected hematoma is possible. Recent small-bowel resection with extensive postsurgical inflammatory changes in the central mid abdomen and pelvis. No evidence of anastomotic leak. No evidence of bowel obstruction. These results were called by  telephone at the time of interpretation on 09/09/2020 at 9:11 pm to provider Gershon Cull, who verbally acknowledged these results. Electronically Signed   By: Maurine Simmering   On: 09/09/2020 21:13   CT Abdomen Pelvis W Contrast  Result Date: 08/28/2020 CLINICAL DATA:  Diverticulitis suspected. Sudden onset abdominal pain. EXAM: CT ABDOMEN AND PELVIS WITH CONTRAST TECHNIQUE: Multidetector CT imaging of the abdomen and pelvis was performed using the standard protocol following bolus administration of intravenous contrast. CONTRAST:  156m OMNIPAQUE IOHEXOL 300 MG/ML  SOLN COMPARISON:  CT abdomen 04/01/14 FINDINGS: Lower chest: Lung bases are clear. Hepatobiliary: No focal hepatic lesion. No biliary duct dilatation. Common bile duct is normal. Pancreas: Pancreas is normal. No ductal dilatation. No pancreatic inflammation. Spleen: Normal spleen Adrenals/urinary tract: Adrenal glands and kidneys normal. Small cortical lesion in the RIGHT kidney is not significant changed from CT 2016. There are multiple diverticula of the bladder along the posterior and anterior wall. Stomach/Bowel: Small hiatal hernia.  Stomach and duodenum normal. A loop of bowel in the LEFT upper quadrant is fluid-filled and has mild inflammatory stranding adjacent to loops (image 39/2). There is air-fluid level within these loops. The loops are mildly dilated up to 3.5 cm (image 39/2.). The bowel upstream and downstream of this dilated loop are small in caliber and the loop has a folded pattern on sagittal imaging (118/6) which could indicate close loop obstruction anatomy. Downstream small bowel is normal. Appendix normal. Ascending transverse colon normal. There are scattered diverticula of the transverse colon. There is descending colon is collapsed. No diverticulitis of the descending colon. Rectosigmoid colon normal. Vascular/Lymphatic: Abdominal aorta is normal caliber. No periportal or retroperitoneal adenopathy. No pelvic adenopathy.  Reproductive: Other: No free fluid. Musculoskeletal: No aggressive osseous lesion. IMPRESSION: 1. Inflamed loop of small bowel LEFT upper quadrant with potential closed loop anatomy. This could progress to high-grade obstruction. Recommend surgical consultation. 2. No diverticulitis. 3. Multiple bladder diverticula. Electronically Signed   By: SSuzy BouchardM.D.   On: 08/28/2020 21:07   DG CHEST PORT 1 VIEW  Result Date: 09/02/2020 CLINICAL DATA:  PICC placement. EXAM: PORTABLE CHEST 1 VIEW COMPARISON:  Earlier today. FINDINGS: Right upper extremity PICC is been retracted, tip now in the mid SVC. No pneumothorax. Persistent low lung volumes with bibasilar atelectasis and vascular crowding. Enteric tube remains in place. Stable heart size and mediastinal contours. IMPRESSION: 1. Tip of the right upper extremity PICC in the mid SVC. No pneumothorax.  2. Persistent low lung volumes with bibasilar atelectasis and vascular crowding. Electronically Signed   By: Keith Rake M.D.   On: 09/02/2020 23:02   DG CHEST PORT 1 VIEW  Result Date: 09/02/2020 CLINICAL DATA:  PICC line placement EXAM: PORTABLE CHEST 1 VIEW COMPARISON:  04/22/2019 FINDINGS: Lung volumes are small and there is resultant vascular crowding at the hila. Mild bibasilar atelectasis. No pneumothorax or pleural effusion. Right upper extremity PICC line tip is seen within the mid right atrium, roughly 4 cm distal to the superior cavoatrial junction. Nasogastric tube extends into the left upper quadrant of the abdomen. Cardiac size is within normal limits when accounting for poor pulmonary insufflation. No acute bone abnormality. IMPRESSION: Right upper extremity PICC line tip within the right atrium 4 cm distal to the superior cavoatrial junction. Pulmonary hypoinflation with bibasilar atelectasis. Electronically Signed   By: Fidela Salisbury MD   On: 09/02/2020 21:46   DG Abd Portable 1V  Result Date: 09/06/2020 CLINICAL DATA:  NG tube  placement EXAM: PORTABLE ABDOMEN - 1 VIEW COMPARISON:  09/02/2020 abdominal radiograph FINDINGS: Enteric tube terminates in the proximal stomach. No dilated small bowel loops. Mild colonic gas and stool. No evidence of pneumatosis or pneumoperitoneum. Probable small left pleural effusion with mild left lung base dense opacity. IMPRESSION: Enteric tube terminates in the proximal stomach. Probable small left pleural effusion with dense mild left lung base opacity favoring atelectasis. Electronically Signed   By: Ilona Sorrel M.D.   On: 09/06/2020 09:08   DG Abd Portable 1V  Result Date: 09/02/2020 CLINICAL DATA:  Small-bowel obstruction. EXAM: PORTABLE ABDOMEN - 1 VIEW COMPARISON:  09/01/2020. FINDINGS: Upper abdomen not imaged. Continued progression of prominent small bowel distention with a paucity of colonic gas. Findings again consistent with persistent small-bowel obstruction. No free air identified. Degenerative changes lumbar spine and both hips. IMPRESSION: Continued progression of prominent small bowel distention. Findings consistent with persistent small-bowel obstruction. Electronically Signed   By: Marcello Moores  Register   On: 09/02/2020 08:50   DG Abd Portable 1V  Result Date: 09/01/2020 CLINICAL DATA:  Small-bowel obstruction. EXAM: PORTABLE ABDOMEN - 1 VIEW COMPARISON:  08/31/2020.  08/30/2020.  CT 08/28/2020. FINDINGS: NG tube noted with tip in stable position over the upper stomach. Progressive small-bowel distention. Small-bowel dilatation up to 5.7 cm noted. Colon is nondistended. No free air noted. No acute bony abnormality. IMPRESSION: 1. NG tube noted with tip in stable position over the upper stomach. 2. Progressive small-bowel distention. Findings again consistent with small bowel obstruction. Electronically Signed   By: Marcello Moores  Register   On: 09/01/2020 06:21   DG Abd Portable 1V  Result Date: 08/31/2020 CLINICAL DATA:  Nasogastric tube placement EXAM: PORTABLE ABDOMEN - 1 VIEW  COMPARISON:  None. FINDINGS: Nasogastric tube tip within the mid body of the stomach. Visualized abdominal gas pattern is unremarkable. No gross free intraperitoneal gas. Right flank and pelvis excluded from view. IMPRESSION: Nasogastric tube tip within the mid body of the stomach. Electronically Signed   By: Fidela Salisbury MD   On: 08/31/2020 17:55   DG Abd Portable 1V  Result Date: 08/31/2020 CLINICAL DATA:  Small bowel obstruction. EXAM: PORTABLE ABDOMEN - 1 VIEW COMPARISON:  08/30/2020 FINDINGS: An enteric tube remains looped in the region of the gastric fundus. There are a few mildly dilated gas-filled loops of small bowel in the left mid abdomen which are more conspicuous than on the prior study. Gas is present in nondilated colon. Heterogeneous bibasilar pulmonary airspace  opacities are suboptimally evaluated due to overpenetration. IMPRESSION: Mildly dilated small bowel loops in the left mid abdomen which could reflect partial obstruction. Electronically Signed   By: Logan Bores M.D.   On: 08/31/2020 08:45   DG Abd Portable 1V  Result Date: 08/30/2020 CLINICAL DATA:  Nasogastric tube replacement EXAM: PORTABLE ABDOMEN - 1 VIEW COMPARISON:  Earlier film of the same day FINDINGS: Nasogastric tube loops in the decompressed gastric fundus. A few gas filled mid abdominal small bowel loops. The right lateral and inferior abdomen are excluded. IMPRESSION: Nasogastric tube loops in the gastric fundus. Electronically Signed   By: Lucrezia Europe M.D.   On: 08/30/2020 13:42   DG Abd Portable 1V  Result Date: 08/30/2020 CLINICAL DATA:  Small-bowel obstruction EXAM: PORTABLE ABDOMEN - 1 VIEW COMPARISON:  08/29/2020, 08/28/2020 FINDINGS: Enteric tube terminates within the gastric body. Abnormally dilated loop of small bowel within the left hemiabdomen measuring up to 4.5 cm in diameter, which appears to be thickened. Faint dilute contrast within small bowel, but not definitively seen within the colon. Excreted  contrast within the urinary bladder. No gross pneumoperitoneum. IMPRESSION: Abnormally dilated loop of small bowel within the left hemiabdomen, which appears to be thickened. Faint dilute contrast within small bowel, but not definitively seen within the colon. Findings concerning for worsening small bowel obstruction, possibly closed loop given appearance on previous CT. Electronically Signed   By: Davina Poke D.O.   On: 08/30/2020 10:23   DG Abd Portable 1V-Small Bowel Obstruction Protocol-initial, 8 hr delay  Result Date: 08/29/2020 CLINICAL DATA:  Small-bowel obstruction EXAM: PORTABLE ABDOMEN - 1 VIEW COMPARISON:  08/29/2020 FINDINGS: Three supine frontal views of the abdomen and pelvis are obtained after the administration of Gastroview approximately 10 hours prior to imaging. Enteric catheter is seen within the gastric lumen. There is significant retention oral contrast within the stomach. Slow diffusion of oral contrast is seen filling dilated loops of small bowel within the lower central abdomen and pelvis. No evidence of transit of contrast into the colon at the time of imaging. Excreted contrast is seen within the urinary bladder. IMPRESSION: 1. Dilute oral contrast filling multiple dilated loops of small bowel throughout the lower abdomen and pelvis, compatible with small-bowel obstruction. No contrast identified within the colon at the time of imaging. 2. Significant retention of oral contrast within the stomach, with indwelling enteric catheter as above. Electronically Signed   By: Randa Ngo M.D.   On: 08/29/2020 15:49   DG Abd Portable 1V  Result Date: 08/29/2020 CLINICAL DATA:  80 year old male NG tube placement. EXAM: PORTABLE ABDOMEN - 1 VIEW COMPARISON:  CT Abdomen and Pelvis 08/28/2020. FINDINGS: Portable AP supine view at 0411 hours. Enteric tube placed into the stomach. Side hole is at the level of the gastric body. Stable visible bowel gas pattern from the recent CT. No acute  cardiopulmonary abnormality identified. IMPRESSION: NG tube placed into the stomach, side hole at the gastric body. Electronically Signed   By: Genevie Ann M.D.   On: 08/29/2020 04:29   [place] EKG SITE RITE  Result Date: 09/02/2020 If Site Rite image not attached, placement could not be confirmed due to current cardiac rhythm.   Labs:  CBC: Recent Labs    09/08/20 0314 09/09/20 0030 09/09/20 2325 09/10/20 0329  WBC 16.3* 17.3* 18.3* 17.8*  HGB 10.5* 9.9* 9.6* 9.3*  HCT 34.1* 32.2* 30.6* 29.5*  PLT 249 277 281 274    COAGS: Recent Labs    11/20/19  0000 01/01/20 0000 02/16/20 1458 08/30/20 0031 08/30/20 0715 09/09/20 2325  INR 2.2 2.3 1.0  --   --  1.1  APTT  --   --  27 69* 68*  --     BMP: Recent Labs    12/03/19 1509 02/16/20 1458 09/07/20 0348 09/09/20 0030 09/09/20 2325 09/10/20 0329  NA 141   < > 141 136 132* 133*  K 4.8   < > 4.2 4.3 4.5 4.5  CL 104   < > 111 110 106 107  CO2 25   < > '24 23 22 22  '$ GLUCOSE 91   < > 108* 121* 113* 118*  BUN 10   < > 35* 27* 28* 27*  CALCIUM 9.7   < > 8.8* 8.9 8.7* 8.7*  CREATININE 0.83   < > 0.98 0.69 0.69 0.72  GFRNONAA 84   < > >60 >60 >60 >60  GFRAA 97  --   --   --   --   --    < > = values in this interval not displayed.    LIVER FUNCTION TESTS: Recent Labs    09/03/20 0641 09/07/20 0348 09/09/20 2325 09/10/20 0329  BILITOT 1.0 0.8 0.8 0.8  AST 32 39 77* 64*  ALT 24 33 95* 89*  ALKPHOS 30* 58 89 93  PROT 4.8* 4.6* 4.9* 4.7*  ALBUMIN 2.5* 1.8* 1.8* 1.8*    TUMOR MARKERS: No results for input(s): AFPTM, CEA, CA199, CHROMGRNA in the last 8760 hours.  Assessment and Plan: 80 y.o. male with past medical history of BPH, coronary artery disease with prior angioplasty, chronic venous insufficiency, prior DVT, dyslipidemia, hypertension, GERD, hyperlipidemia, atrial fibrillation, and prior stroke.  He also has a history of perforated diverticulitis requiring Hartman's procedure in 2010 which has since been reversed  and diverted with reversal of his loop ileostomy and an incision that resulted in a hernia repair with Strattice.  He recently presented with small bowel obstruction secondary to adhesive disease and underwent exploratory lap with lysis of adhesions, small bowel resection and placement of vacuum dressing on 09/02/2020.  Due to recent abdominal pain, fever, cytosis and prolonged postop ileus patient underwent CT abdomen pelvis 7/27 which revealed:   Large heterogeneous collection in the anterior pelvis measuring 18.0 x 6.8 cm in the axial dimension, favored to represent a hematoma with varying ages of blood products. Given the patient's leukocytosis, an infected hematoma is possible.   Recent small-bowel resection with extensive postsurgical inflammatory changes in the central mid abdomen and pelvis. No evidence of anastomotic leak. No evidence of bowel obstruction.    He is currently afebrile, WBC 17.8, hemoglobin 9.3, platelets normal, creatinine normal ,PT/INR normal.  Request now received from surgical team for image guided aspiration /possible drainage of anterior pelvic fluid collection.  Imaging studies have been reviewed by Dr.Henn. Risks and benefits discussed with the patient including bleeding, infection, damage to adjacent structures, bowel perforation/fistula connection, and sepsis.  All of the patient's questions were answered, patient is agreeable to proceed. Consent signed and in chart.  Procedure scheduled for later today.    Thank you for this interesting consult.  I greatly enjoyed meeting William Norman and look forward to participating in their care.  A copy of this report was sent to the requesting provider on this date.  Electronically Signed: D. Rowe Robert, PA-C 09/10/2020, 10:40 AM   I spent a total of  25 minutes   in face to face in clinical  consultation, greater than 50% of which was counseling/coordinating care for CT-guided aspiration/possible drainage of  anterior pelvic fluid collection

## 2020-09-10 NOTE — Progress Notes (Signed)
IP rehab admissions - I met with patient, his wife and his son at the bedside today.  I explained inpatient rehab.  Wife prefers SNF placement and mentioned [facility] as a potential option.  Wife has an autoimmune disorder which makes it hard for her to be a caregiver at this time.  Recommend pursuit of SNF placement at discharge.  Call for questions.  [phone]

## 2020-09-10 NOTE — Procedures (Signed)
Interventional Radiology Procedure:   Indications: Post operative fluid collections  Procedure: CT guided aspiration  Findings: Aspirated 90 ml of dark red fluid from anterior abdominal / pelvic collection.   Findings compatible a post operative hematoma.   Complications: No immediate complications noted.     EBL: Minimal  Plan: Return to inpatient floor.  Send fluid for culture.     Darran Gabay R. Anselm Pancoast, MD  Pager: [phone]

## 2020-09-10 NOTE — Progress Notes (Signed)
Physical Therapy Treatment Patient Details Name: William Norman MRN: [id number] DOB: 1940/08/30 Today's Date: 09/10/2020    History of Present Illness 80 y.o. male presenting with abdominal pain 08/28/20. +small bowel obstruction, rate-controlled afib; 09/02/20 S/P exploratory laparotomy, extensive LOA, SBR x1    PMH significant for atrial fibrillation on Eliquis, CAD, history of CVA, hypertension, perforated bowel in 2010, colostomy reversal in 2011, and SBO in 2016 that resolved with conservative management, now    PT Comments    Pt supine in bed this session.  Pt required min to mod +2 assistance.  HR stable and denies dizziness this session.  Able to perform LE exercises post short gt trial.  Continue to follow for progression of functional mobility.  CIR consult happened today and family prefers snf will inform supervising PT of need for change in recommendations.     Follow Up Recommendations  SNF (family prefers snf ( [facility]))     Equipment Recommendations       Recommendations for Other Services       Precautions / Restrictions Precautions Precautions: Fall Precaution Comments: abdominal wound vac, monitor HR, coretrak Restrictions Weight Bearing Restrictions: No Other Position/Activity Restrictions: used pillow for abdominal bracing.    Mobility  Bed Mobility Overal bed mobility: Needs Assistance Bed Mobility: Supine to Sit;Rolling Rolling: Min assist Sidelying to sit: Mod assist;+2 for physical assistance       General bed mobility comments: Pt required assistance to move to edge of bed and elevate trunk into seated position.    Transfers Overall transfer level: Needs assistance Equipment used: Rolling walker (2 wheeled) Transfers: Sit to/from Stand Sit to Stand: Min assist;+2 safety/equipment;From elevated surface Stand pivot transfers: Mod assist       General transfer comment: Min assistance to power up from elevated surface.  Cues for hand  placement to and from seated surface.  Ambulation/Gait Ambulation/Gait assistance: Mod assist;+2 safety/equipment Gait Distance (Feet): 6 Feet Assistive device: Rolling walker (2 wheeled) Gait Pattern/deviations: Step-through pattern;Trunk flexed     General Gait Details: Cues for sequencing and RW use, assistance for turns and backing.  HR stable at 104 bpm this session, however still fatigued so limited short trial to chair with exercises in stting.   Stairs             Wheelchair Mobility    Modified Rankin (Stroke Patients Only)       Balance Overall balance assessment: Needs assistance   Sitting balance-Leahy Scale: Fair       Standing balance-Leahy Scale: Poor Standing balance comment: Heavy reliance on UE use and external assistance                            Cognition Arousal/Alertness: Awake/alert Behavior During Therapy: WFL for tasks assessed/performed Overall Cognitive Status: Within Functional Limits for tasks assessed                                        Exercises General Exercises - Lower Extremity Long Arc Quad: AROM;Both;10 reps;Seated Hip Flexion/Marching: AROM;Both;10 reps;Seated Other Exercises Other Exercises: IS x 5 reps 1516m.    General Comments        Pertinent Vitals/Pain Pain Assessment: No/denies pain Pain Score: 6  Pain Location: abdomen Pain Descriptors / Indicators: Guarding;Grimacing;Moaning;Operative site guarding Pain Intervention(s): Monitored during session;Repositioned    Home Living  Prior Function            PT Goals (current goals can now be found in the care plan section) Acute Rehab PT Goals Patient Stated Goal: be able to go home Potential to Achieve Goals: Good Progress towards PT goals: Progressing toward goals    Frequency    Min 3X/week      PT Plan Discharge plan needs to be updated    Co-evaluation              AM-PAC  PT "6 Clicks" Mobility   Outcome Measure  Help needed turning from your back to your side while in a flat bed without using bedrails?: A Little Help needed moving from lying on your back to sitting on the side of a flat bed without using bedrails?: A Little Help needed moving to and from a bed to a chair (including a wheelchair)?: A Little Help needed standing up from a chair using your arms (e.g., wheelchair or bedside chair)?: A Little Help needed to walk in hospital room?: A Little Help needed climbing 3-5 steps with a railing? : A Little 6 Click Score: 18    End of Session Equipment Utilized During Treatment: Oxygen Activity Tolerance: Patient tolerated treatment well Patient left: in chair;with call bell/phone within reach;with chair alarm set Nurse Communication: Mobility status PT Visit Diagnosis: Difficulty in walking, not elsewhere classified (R26.2)     Time: [id number] PT Time Calculation (min) (ACUTE ONLY): 29 min  Charges:  $Gait Training: 8-22 mins $Therapeutic Exercise: 8-22 mins                     William Norman , PTA Acute Rehabilitation Services Pager [phone] Office [phone]    William Norman Eli Hose 09/10/2020, 4:15 PM

## 2020-09-10 NOTE — Progress Notes (Signed)
Patient's recent HGB at 2325 is 9.6 from 9.9 at 0030 earlier. J. Daniels,NP updated. Current V/S 116/64 T98.1 PR74 RR18 02sat 96%room air.

## 2020-09-10 NOTE — Progress Notes (Signed)
Patient is currently resting in bed, verbally responsive. Denies any pain at this moment. Current V/S BP136/69 PR82 RR18 T98.8 94%room air. HGB 9.3.  Call bell within reach and will continue to close monitor.

## 2020-09-10 NOTE — Progress Notes (Signed)
Progress Note  8 Days Post-Op  Subjective: No BM yesterday, but passing more flatus.  No nausea with NGT output.  Still with some centralized abdominal discomfort.  Not getting up much apparently  Objective: Vital signs in last 24 hours: Temp:  [98.1 F (36.7 C)-99.7 F (37.6 C)] 98.4 F (36.9 C) (07/28 0820) Pulse Rate:  [74-110] 76 (07/28 0820) Resp:  [17-19] 18 (07/28 0820) BP: (116-136)/(64-77) 123/67 (07/28 0820) SpO2:  [94 %-97 %] 95 % (07/28 0820) Last BM Date: 09/08/20  Intake/Output from previous day: 07/27 0701 - 07/28 0700 In: -  Out: 2500 [Urine:2500] Intake/Output this shift: No intake/output data recorded.  PE: Abd: soft, mild distention, but +BS  Midline incision with wound VAC in place .   Lab Results:  Recent Labs    09/09/20 2325 09/10/20 0329  WBC 18.3* 17.8*  HGB 9.6* 9.3*  HCT 30.6* 29.5*  PLT 281 274   BMET Recent Labs    09/09/20 2325 09/10/20 0329  NA 132* 133*  K 4.5 4.5  CL 106 107  CO2 22 22  GLUCOSE 113* 118*  BUN 28* 27*  CREATININE 0.69 0.72  CALCIUM 8.7* 8.7*   PT/INR Recent Labs    09/09/20 2325  LABPROT 13.9  INR 1.1   CMP     Component Value Date/Time   NA 133 (L) 09/10/2020 0329   NA 141 12/03/2019 1509   K 4.5 09/10/2020 0329   CL 107 09/10/2020 0329   CO2 22 09/10/2020 0329   GLUCOSE 118 (H) 09/10/2020 0329   GLUCOSE 94 01/10/2006 1108   BUN 27 (H) 09/10/2020 0329   BUN 10 12/03/2019 1509   CREATININE 0.72 09/10/2020 0329   CREATININE 0.84 09/30/2019 1349   CALCIUM 8.7 (L) 09/10/2020 0329   PROT 4.7 (L) 09/10/2020 0329   PROT 6.5 12/03/2019 1509   ALBUMIN 1.8 (L) 09/10/2020 0329   ALBUMIN 4.2 12/03/2019 1509   AST 64 (H) 09/10/2020 0329   ALT 89 (H) 09/10/2020 0329   ALKPHOS 93 09/10/2020 0329   BILITOT 0.8 09/10/2020 0329   BILITOT 0.9 12/03/2019 1509   GFRNONAA >60 09/10/2020 0329   GFRAA 97 12/03/2019 1509   Lipase     Component Value Date/Time   LIPASE 15 08/28/2020 1931        Studies/Results: CT ABDOMEN PELVIS W CONTRAST  Result Date: 09/09/2020 CLINICAL DATA:  Abdominal pain, fever, post-op leukocytosis, prolonged post op ileus EXAM: CT ABDOMEN AND PELVIS WITH CONTRAST TECHNIQUE: Multidetector CT imaging of the abdomen and pelvis was performed using the standard protocol following bolus administration of intravenous contrast. CONTRAST:  174m OMNIPAQUE IOHEXOL 300 MG/ML  SOLN COMPARISON:  CT 08/28/2020 FINDINGS: Lower chest: Bibasilar atelectasis, small left and trace right effusions. Mild paraseptal emphysema. Coronary artery calcifications. Prominent left atrium. Hepatobiliary: No focal liver abnormality is seen. Cholelithiasis no evidence of cholecystitis. Pancreas: Unremarkable. No pancreatic ductal dilatation or surrounding inflammatory changes. Spleen: Normal in size without focal abnormality. Adrenals/Urinary Tract: Adrenal glands are unremarkable. No hydronephrosis. Unchanged right renal cyst. The bladder is decompressed with Foley catheter in place in with intraluminal gas. Stomach/Bowel: Small hiatal hernia. The stomach is within normal limits. There is no evidence of bowel obstruction. Evidence of small bowel resection. Oral contrast material is noted throughout the small bowel and within the ascending and transverse colon. There is no evidence of contrast leak the appendix is normal. There is extensive postsurgical inflammatory change in the mid to lower central abdomen and anterior pelvis.  In the anterior pelvis, there is a heterogeneous collection, on the right side of this collection there is low-density fluid with a fluid fluid level layering posteriorly with more dense material, and the left anterior pelvis there is a or heterogeneous higher density appearance. This entire area measures approximately 18.0 x 6.8 cm. Vascular/Lymphatic: Aortoiliac atherosclerotic calcifications. No AAA. No lymphadenopathy. Reproductive: Mildly enlarged prostate. Other:  Prior midline incision. Musculoskeletal: No acute osseous abnormality. No suspicious lytic or blastic lesions. Multilevel degenerative changes of the spine. IMPRESSION: Large heterogeneous collection in the anterior pelvis measuring 18.0 x 6.8 cm in the axial dimension, favored to represent a hematoma with varying ages of blood products. Given the patient's leukocytosis, an infected hematoma is possible. Recent small-bowel resection with extensive postsurgical inflammatory changes in the central mid abdomen and pelvis. No evidence of anastomotic leak. No evidence of bowel obstruction. These results were called by telephone at the time of interpretation on 09/09/2020 at 9:11 pm to provider Gershon Cull, who verbally acknowledged these results. Electronically Signed   By: Maurine Simmering   On: 09/09/2020 21:13    Anti-infectives: Anti-infectives (From admission, onward)    Start     Dose/Rate Route Frequency Ordered Stop   09/10/20 0600  piperacillin-tazobactam (ZOSYN) IVPB 3.375 g        3.375 g 12.5 mL/hr over 240 Minutes Intravenous Every 8 hours 09/09/20 2204     09/09/20 2300  piperacillin-tazobactam (ZOSYN) IVPB 3.375 g        3.375 g 100 mL/hr over 30 Minutes Intravenous  Once 09/09/20 2204 09/09/20 2307   09/02/20 1845  piperacillin-tazobactam (ZOSYN) IVPB 3.375 g        3.375 g 12.5 mL/hr over 240 Minutes Intravenous Every 8 hours 09/02/20 1819 09/07/20 1359   09/02/20 1215  ceFAZolin (ANCEF) IVPB 2g/100 mL premix        2 g 200 mL/hr over 30 Minutes Intravenous  Once 09/02/20 1115 09/02/20 1320   08/29/20 0600  cefTRIAXone (ROCEPHIN) 1 g in sodium chloride 0.9 % 100 mL IVPB        1 g 200 mL/hr over 30 Minutes Intravenous Every 24 hours 08/29/20 0204 08/31/20 0730   08/28/20 2300  piperacillin-tazobactam (ZOSYN) IVPB 3.375 g        3.375 g 100 mL/hr over 30 Minutes Intravenous  Once 08/28/20 2245 08/28/20 2342        Assessment/Plan POD 8,  S/P exploratory laparotomy, extensive LOA,  SBR x1 09/02/20 Dr. Georgette Dover for closed loop bowel obstruction -ileus seems to be improving, but will keep NPO for IR drain evaluation of fluid collection noted on CT.  Given he is on a heparin gtt if no plans for a drain today will let him try CLD. -no definite need for abx therapy at this point.  This may be a sterile collection.  Follow  - continue TNA - mobilize as tolerated, PT/OT  - VAC m/w/f    FEN - NPO, IVF @ 50 cc/h, TPN, CLD today if no plans for IR drain today VTE - SCDs, heparin gtt  ID - Zosyn for 5 days post-op,  do not need to add any yet.  Await eval of fluid collection   A. Fib on Eliquis - on hold  History of CVA January 2022 UTI - competed course of rocephin  Hypertension Hyperlipidemia  LOS: 12 days    Henreitta Cea, [facility] 09/10/2020, 9:16 AM Please see Amion for pager number during day hours 7:00am-4:30pm

## 2020-09-10 NOTE — Progress Notes (Signed)
PROGRESS NOTE    William Norman  [id number] DOB: [birth year] DOA: 08/28/2020 PCP: Vivi Barrack, MD   Chief Complain:Abdominal pain  Brief Narrative:  Patient is a 80 year old male with medical history significant for atrial fibrillation on Eliquis, coronary artery disease, history of CVA,, hypertension, perforated bowel in 2010 colostomy status post colostomy reversal in 2011 and small bowel obstruction 2016 that resolved with conservative management who presented with abdominal pain and was admitted on August 28, 2020.  Patient was found to have closed loop bowel obstruction.  General surgery consulted and he underwent expiratory laparotomy, extensive lysis evaluation, small bowel resection on 09/02/2020. Surgery is closely following.  We are waiting for bowel function.  CT abdomen/pelvis follow-up done on 09/09/20 showed large heterogeneous collection in the anterior pelvis consistent with hematoma.  IR consulted today for possible drain placement.  Assessment & Plan:   Principal Problem:   SBO (small bowel obstruction) (HCC) Active Problems:   Essential hypertension   Personal history of DVT (deep vein thrombosis)   Persistent atrial fibrillation (HCC)   CAD (coronary artery disease)   History of ischemic stroke   Recurrent UTI   Protein-calorie malnutrition, severe   1.Small bowel obstruction  Presented with abdominal pain Patient was found to have closed loop bowel obstruction.  General surgery consulted and he underwent expiratory laparotomy, extensive lysis evaluation, small bowel resection on 09/02/2020. Surgery is closely following. Continue TPN He was also on IV antibiotics, discontinued after 5 days course postop. Currently has a wound VAC on the abdominal surgical wound. No BM yet.  He says he is passing flatus now.  NG tube was accidentally removed by patient, not reinserted.  2.  Hematoma  CT abdomen/pelvis follow-up done on 09/09/20 showed large( 18 x 6.8 cm)  heterogeneous collection in the anterior pelvis consistent with hematoma.  IR consulted today for possible drain placement. Hemoglobin had dropped from the range of 14-9 in last few days.  Continue to monitor H&H.  We will transfuse if hemoglobin drops less than 7.  3.  Atrial fibrillation  rate controlled He was on heparin drip, discontinued after finding of hematoma.  4.  Urinary tract infection  Patient was started on ceftriaxone and he has had 3 doses  5.  History of CVA  / coronary artery disease On statin, beta-blocker and Eliquis ,these are on hold due to current conditions  6.  Hypertension Monitor blood pressure.  Continue current medications  7.  Leukocytosis WBC trended up today.  Patient is afebrile.  Will check CT abd  Nutrition Problem: Severe Malnutrition Etiology: acute illness (SBO with no oral intake x 5 days)      DVT prophylaxis:SCD Code Status: Full Family Communication: Called and discussed with the wife on phone on 09/10/20 Status is: Inpatient  Remains inpatient appropriate because:Persistent severe electrolyte disturbances and IV treatments appropriate due to intensity of illness or inability to take PO  Dispo: The patient is from: Home              Anticipated d/c is to: Home               Patient currently is not medically stable to d/c.   Difficult to place patient No   Consultants: Surgery  Procedures:As above  Antimicrobials:  Anti-infectives (From admission, onward)    Start     Dose/Rate Route Frequency Ordered Stop   09/10/20 0600  piperacillin-tazobactam (ZOSYN) IVPB 3.375 g        3.375  g 12.5 mL/hr over 240 Minutes Intravenous Every 8 hours 09/09/20 2204     09/09/20 2300  piperacillin-tazobactam (ZOSYN) IVPB 3.375 g        3.375 g 100 mL/hr over 30 Minutes Intravenous  Once 09/09/20 2204 09/09/20 2307   09/02/20 1845  piperacillin-tazobactam (ZOSYN) IVPB 3.375 g        3.375 g 12.5 mL/hr over 240 Minutes Intravenous Every 8  hours 09/02/20 1819 09/07/20 1359   09/02/20 1215  ceFAZolin (ANCEF) IVPB 2g/100 mL premix        2 g 200 mL/hr over 30 Minutes Intravenous  Once 09/02/20 1115 09/02/20 1320   08/29/20 0600  cefTRIAXone (ROCEPHIN) 1 g in sodium chloride 0.9 % 100 mL IVPB        1 g 200 mL/hr over 30 Minutes Intravenous Every 24 hours 08/29/20 0204 08/31/20 0730   08/28/20 2300  piperacillin-tazobactam (ZOSYN) IVPB 3.375 g        3.375 g 100 mL/hr over 30 Minutes Intravenous  Once 08/28/20 2245 08/28/20 2342       Subjective:  Patient seen and examined the bedside this morning.  Hemodynamically stable during my evaluation.  Denies any worsening abdominal pain, nausea or vomiting.  He says he is passing some flatus today.   Objective: Vitals:   09/09/20 2208 09/10/20 0022 09/10/20 0434 09/10/20 0820  BP: 134/77 116/64 136/69 123/67  Pulse: 85 74 82 76  Resp: '18 18 18 18  '$ Temp: 99.7 F (37.6 C) 98.1 F (36.7 C) 98.8 F (37.1 C) 98.4 F (36.9 C)  TempSrc: Oral Oral Oral Oral  SpO2: 97% 96% 94% 95%  Weight:      Height:        Intake/Output Summary (Last 24 hours) at 09/10/2020 0827 Last data filed at 09/10/2020 0457 Gross per 24 hour  Intake --  Output 2500 ml  Net -2500 ml   Filed Weights   08/29/20 0100 08/30/20 0501 09/01/20 0457  Weight: 97.1 kg 99.1 kg 96.5 kg    Examination:  General exam: Overall comfortable, not in distress, deconditioned, elderly male, chronically ill looking HEENT: PERRL Respiratory system:  no wheezes or crackles  Cardiovascular system: S1 & S2 heard, RRR.  Gastrointestinal system: Abdomen is distended, midline abdominal surgical wound with wound VAC. Slow bowel sounds heard  Central nervous system: Alert and oriented Extremities: No edema, no clubbing ,no cyanosis Skin: No rashes, no ulcers,no icterus     Data Reviewed: I have personally reviewed following labs and imaging studies  CBC: Recent Labs  Lab 09/07/20 0348 09/08/20 0314 09/09/20 0030  09/09/20 2325 09/10/20 0329  WBC 14.2* 16.3* 17.3* 18.3* 17.8*  NEUTROABS 11.2*  --   --   --   --   HGB 11.2* 10.5* 9.9* 9.6* 9.3*  HCT 35.5* 34.1* 32.2* 30.6* 29.5*  MCV 94.4 95.3 95.0 94.7 94.9  PLT 220 249 277 281 [id number]   Basic Metabolic Panel: Recent Labs  Lab 09/05/20 0630 09/06/20 1051 09/07/20 0348 09/09/20 0030 09/09/20 2325 09/10/20 0329  NA 141 139 141 136 132* 133*  K 3.4* 4.1 4.2 4.3 4.5 4.5  CL 107 109 111 110 106 107  CO2 '28 23 24 23 22 22  '$ GLUCOSE 106* 125* 108* 121* 113* 118*  BUN 23 35* 35* 27* 28* 27*  CREATININE 0.74 1.36* 0.98 0.69 0.69 0.72  CALCIUM 8.8* 9.2 8.8* 8.9 8.7* 8.7*  MG 2.0 2.1 2.1 2.1  --  2.0  PHOS 2.2* 3.7 2.9  3.1  --  3.3   GFR: Estimated Creatinine Clearance: 88.8 mL/min (by C-G formula based on SCr of 0.72 mg/dL). Liver Function Tests: Recent Labs  Lab 09/07/20 0348 09/09/20 2325 09/10/20 0329  AST 39 77* 64*  ALT 33 95* 89*  ALKPHOS 58 89 93  BILITOT 0.8 0.8 0.8  PROT 4.6* 4.9* 4.7*  ALBUMIN 1.8* 1.8* 1.8*   No results for input(s): LIPASE, AMYLASE in the last 168 hours. No results for input(s): AMMONIA in the last 168 hours. Coagulation Profile: Recent Labs  Lab 09/09/20 2325  INR 1.1   Cardiac Enzymes: No results for input(s): CKTOTAL, CKMB, CKMBINDEX, TROPONINI in the last 168 hours. BNP (last 3 results) No results for input(s): PROBNP in the last 8760 hours. HbA1C: No results for input(s): HGBA1C in the last 72 hours. CBG: Recent Labs  Lab 09/09/20 0542 09/09/20 1211 09/09/20 1742 09/10/20 0020 09/10/20 0526  GLUCAP 122* 123* 115* 126* 118*   Lipid Profile: No results for input(s): CHOL, HDL, LDLCALC, TRIG, CHOLHDL, LDLDIRECT in the last 72 hours.  Thyroid Function Tests: No results for input(s): TSH, T4TOTAL, FREET4, T3FREE, THYROIDAB in the last 72 hours. Anemia Panel: No results for input(s): VITAMINB12, FOLATE, FERRITIN, TIBC, IRON, RETICCTPCT in the last 72 hours. Sepsis Labs: No results for  input(s): PROCALCITON, LATICACIDVEN in the last 168 hours.  Recent Results (from the past 240 hour(s))  Surgical pcr screen     Status: None   Collection Time: 09/01/20  8:09 AM   Specimen: Nasal Mucosa; Nasal Swab  Result Value Ref Range Status   MRSA, PCR NEGATIVE NEGATIVE Final   Staphylococcus aureus NEGATIVE NEGATIVE Final    Comment: (NOTE) The Xpert SA Assay (FDA approved for NASAL specimens in patients 40 years of age and older), is one component of a comprehensive surveillance program. It is not intended to diagnose infection nor to guide or monitor treatment. Performed at [facility], [place] [address], [place],  [address]          Radiology Studies: CT ABDOMEN PELVIS W CONTRAST  Result Date: 09/09/2020 CLINICAL DATA:  Abdominal pain, fever, post-op leukocytosis, prolonged post op ileus EXAM: CT ABDOMEN AND PELVIS WITH CONTRAST TECHNIQUE: Multidetector CT imaging of the abdomen and pelvis was performed using the standard protocol following bolus administration of intravenous contrast. CONTRAST:  14m OMNIPAQUE IOHEXOL 300 MG/ML  SOLN COMPARISON:  CT 08/28/2020 FINDINGS: Lower chest: Bibasilar atelectasis, small left and trace right effusions. Mild paraseptal emphysema. Coronary artery calcifications. Prominent left atrium. Hepatobiliary: No focal liver abnormality is seen. Cholelithiasis no evidence of cholecystitis. Pancreas: Unremarkable. No pancreatic ductal dilatation or surrounding inflammatory changes. Spleen: Normal in size without focal abnormality. Adrenals/Urinary Tract: Adrenal glands are unremarkable. No hydronephrosis. Unchanged right renal cyst. The bladder is decompressed with Foley catheter in place in with intraluminal gas. Stomach/Bowel: Small hiatal hernia. The stomach is within normal limits. There is no evidence of bowel obstruction. Evidence of small bowel resection. Oral contrast material is noted throughout the small bowel and within the  ascending and transverse colon. There is no evidence of contrast leak the appendix is normal. There is extensive postsurgical inflammatory change in the mid to lower central abdomen and anterior pelvis. In the anterior pelvis, there is a heterogeneous collection, on the right side of this collection there is low-density fluid with a fluid fluid level layering posteriorly with more dense material, and the left anterior pelvis there is a or heterogeneous higher density appearance. This entire area  measures approximately 18.0 x 6.8 cm. Vascular/Lymphatic: Aortoiliac atherosclerotic calcifications. No AAA. No lymphadenopathy. Reproductive: Mildly enlarged prostate. Other: Prior midline incision. Musculoskeletal: No acute osseous abnormality. No suspicious lytic or blastic lesions. Multilevel degenerative changes of the spine. IMPRESSION: Large heterogeneous collection in the anterior pelvis measuring 18.0 x 6.8 cm in the axial dimension, favored to represent a hematoma with varying ages of blood products. Given the patient's leukocytosis, an infected hematoma is possible. Recent small-bowel resection with extensive postsurgical inflammatory changes in the central mid abdomen and pelvis. No evidence of anastomotic leak. No evidence of bowel obstruction. These results were called by telephone at the time of interpretation on 09/09/2020 at 9:11 pm to provider Gershon Cull, who verbally acknowledged these results. Electronically Signed   By: Maurine Simmering   On: 09/09/2020 21:13        Scheduled Meds:  Chlorhexidine Gluconate Cloth  6 each Topical Daily   insulin aspart  0-15 Units Subcutaneous Q6H   lidocaine  1 patch Transdermal Q24H   pantoprazole (PROTONIX) IV  40 mg Intravenous Q24H   sodium chloride flush  10-40 mL Intracatheter Q12H   Continuous Infusions:  sodium chloride 10 mL/hr at 09/02/20 0645   methocarbamol (ROBAXIN) IV 1,000 mg (09/10/20 0438)   piperacillin-tazobactam (ZOSYN)  IV 3.375 g  (09/10/20 0528)   TPN ADULT (ION) 100 mL/hr at 09/09/20 1740   TPN ADULT (ION)       LOS: 12 days    Time spent: 25 mins.More than 50% of that time was spent in counseling and/or coordination of care.      Shelly Coss, MD [facility] P7/28/2022, 8:27 AM

## 2020-09-10 NOTE — Progress Notes (Addendum)
PHARMACY - TOTAL PARENTERAL NUTRITION CONSULT NOTE  Indication: SBO  Patient Measurements: Height: 6' (182.9 cm) Weight: 96.5 kg (212 lb 11.9 oz) IBW/kg (Calculated) : 77.6 TPN AdjBW (KG): 82.8 Body mass index is 28.85 kg/m. Usual Weight: 97-100 kg  Assessment:  79 YOM presented on 7/15 with acute abdominal pain. CT showed inflamed loop of small bowel LEFT upper quadrant with potential closed loop anatomy. This could progress to high-grade obstruction. Initially treated with conservative management, but abdominal xray shows progressive SB distention consistent with SBO.  Pt denies recent weight loss. - PMH significant for GERD, bowel perforation in 2010, colostomy reversal in 2011 and SBO in 2016. CVA 02/2020, HTN, HLD.  Glucose / Insulin: no hx DM. CBGs 115-126. SSI 2 units/24 hrs. - A1c 5.9 in June 2022  Electrolytes: K 4.5, CoCa: 10.7 slightly elevated, Mg/Phos WNL - [id number] product <55.   - goal K >4.0 and Mg >2.0 to help optimize bowel function - s/p thiamine x 7d in TPN (ended 7/28)  Renal: SCr <1. Good UOP.  Hepatic: LFTs slightly elevated, trending down. TG 74. Albumin 1.8. Prealbumin 14.5>8.7 post-op  Intake / Output; MIVF: UOP 1.1 ml/kg/hr=2548m. NG out. LBM 7/26 after supp. Wound vac: none noted  GI: IV PPI/24h. Ileus slowly improving. Now with leukocytosis and low grade fever. IV Zosyn #2 for possible infected abdominal hematoma.  GI Imaging:  - 7/24 Abd x-ray: ordered  - 7/27: CT: probably pelvic hematoma  GI Surgeries / Procedures:   - 7/20 ex-lap: SBO with closed loop obstruction in LUQ, small bowel resection (~40 cm), lysis of adhesions  Central access: 09/02/20 TPN start date: 09/02/20  Nutritional Goals (RD recommendation 7/21): 2200-2400 kCal, 125-155g AA per day, fluid >/= 2.2 L  Current Nutrition:  NPO TPN - goal rate 1016mhr to prove 139g AA and 2314 kcal   Plan:  No change to TPN formula today except d/c thiamine. Continue TPN at 100 ml/hr -  this will provide 139 g AA and 2313 kcal meeting 100% of needs  Electrolytes in TPN: removed Ca. Continue Na 5031mL, K 50 mEq/L, Mg 7.5 mEq/L, Phos 25 mmol/L. Continue Cl:[id number]. Add standard MVI and trace elements to TPN Change moderate SSI and CBGs to q6hrs. Standard TPN labs Monday/Thursday and PRN. No labs tomorrow needed.   Tammy Wickliffe S. RobAlford HighlandharmD, BCPS Clinical Staff Pharmacist [email or web address] 09/10/2020, 8:14 AM

## 2020-09-11 DIAGNOSIS — K56609 Unspecified intestinal obstruction, unspecified as to partial versus complete obstruction: Secondary | ICD-10-CM | POA: Diagnosis not present

## 2020-09-11 LAB — CBC WITH DIFFERENTIAL/PLATELET
Abs Immature Granulocytes: 1.28 10*3/uL — ABNORMAL HIGH (ref 0.00–0.07)
Basophils Absolute: 0.1 10*3/uL (ref 0.0–0.1)
Basophils Relative: 1 %
Eosinophils Absolute: 0.2 10*3/uL (ref 0.0–0.5)
Eosinophils Relative: 1 %
HCT: 28.4 % — ABNORMAL LOW (ref 39.0–52.0)
Hemoglobin: 9.1 g/dL — ABNORMAL LOW (ref 13.0–17.0)
Immature Granulocytes: 8 %
Lymphocytes Relative: 13 %
Lymphs Abs: 2.2 10*3/uL (ref 0.7–4.0)
MCH: 30 pg (ref 26.0–34.0)
MCHC: 32 g/dL (ref 30.0–36.0)
MCV: 93.7 fL (ref 80.0–100.0)
Monocytes Absolute: 1.6 10*3/uL — ABNORMAL HIGH (ref 0.1–1.0)
Monocytes Relative: 9 %
Neutro Abs: 11.3 10*3/uL — ABNORMAL HIGH (ref 1.7–7.7)
Neutrophils Relative %: 68 %
Platelets: 289 10*3/uL (ref 150–400)
RBC: 3.03 MIL/uL — ABNORMAL LOW (ref 4.22–5.81)
RDW: 15.1 % (ref 11.5–15.5)
WBC: 16.7 10*3/uL — ABNORMAL HIGH (ref 4.0–10.5)
nRBC: 0.1 % (ref 0.0–0.2)

## 2020-09-11 LAB — GLUCOSE, CAPILLARY
Glucose-Capillary: 101 mg/dL — ABNORMAL HIGH (ref 70–99)
Glucose-Capillary: 114 mg/dL — ABNORMAL HIGH (ref 70–99)
Glucose-Capillary: 123 mg/dL — ABNORMAL HIGH (ref 70–99)
Glucose-Capillary: 126 mg/dL — ABNORMAL HIGH (ref 70–99)
Glucose-Capillary: 139 mg/dL — ABNORMAL HIGH (ref 70–99)

## 2020-09-11 LAB — BASIC METABOLIC PANEL
Anion gap: 6 (ref 5–15)
BUN: 26 mg/dL — ABNORMAL HIGH (ref 8–23)
CO2: 23 mmol/L (ref 22–32)
Calcium: 8.8 mg/dL — ABNORMAL LOW (ref 8.9–10.3)
Chloride: 103 mmol/L (ref 98–111)
Creatinine, Ser: 0.81 mg/dL (ref 0.61–1.24)
GFR, Estimated: >60 mL/min (ref >60)
Glucose, Bld: 115 mg/dL — ABNORMAL HIGH (ref 70–99)
Potassium: 4.4 mmol/L (ref 3.5–5.1)
Sodium: 132 mmol/L — ABNORMAL LOW (ref 135–145)

## 2020-09-11 LAB — HEPARIN LEVEL (UNFRACTIONATED): Heparin Unfractionated: <0.1 IU/mL — ABNORMAL LOW (ref 0.30–0.70)

## 2020-09-11 MED ORDER — BOOST / RESOURCE BREEZE PO LIQD CUSTOM
1.0000 | Freq: Three times a day (TID) | ORAL | Status: DC
  Administered 2020-09-11 – 2020-09-14 (×10): 1 via ORAL

## 2020-09-11 MED ORDER — TRAMADOL HCL 50 MG PO TABS
50.0000 mg | ORAL_TABLET | Freq: Four times a day (QID) | ORAL | Status: DC | PRN
  Administered 2020-09-11 – 2020-09-13 (×2): 50 mg via ORAL
  Filled 2020-09-11 (×4): qty 1

## 2020-09-11 MED ORDER — ACETAMINOPHEN 325 MG PO TABS
650.0000 mg | ORAL_TABLET | Freq: Four times a day (QID) | ORAL | Status: DC | PRN
  Administered 2020-09-13: 650 mg via ORAL
  Filled 2020-09-11: qty 2

## 2020-09-11 MED ORDER — TRAVASOL 10 % IV SOLN
INTRAVENOUS | Status: AC
  Filled 2020-09-11: qty 1392

## 2020-09-11 MED ORDER — FENTANYL CITRATE (PF) 100 MCG/2ML IJ SOLN
25.0000 ug | Freq: Once | INTRAMUSCULAR | Status: AC
  Administered 2020-09-11: 25 ug via INTRAVENOUS

## 2020-09-11 MED ORDER — ATENOLOL 25 MG PO TABS
25.0000 mg | ORAL_TABLET | Freq: Every day | ORAL | Status: DC
  Administered 2020-09-11 – 2020-09-18 (×8): 25 mg via ORAL
  Filled 2020-09-11 (×8): qty 1

## 2020-09-11 MED ORDER — FENTANYL CITRATE (PF) 100 MCG/2ML IJ SOLN
INTRAMUSCULAR | Status: AC
  Filled 2020-09-11: qty 2

## 2020-09-11 MED ORDER — METHOCARBAMOL 500 MG PO TABS
1000.0000 mg | ORAL_TABLET | Freq: Three times a day (TID) | ORAL | Status: DC
  Administered 2020-09-11 – 2020-09-18 (×21): 1000 mg via ORAL
  Filled 2020-09-11 (×21): qty 2

## 2020-09-11 MED ORDER — MORPHINE SULFATE (PF) 2 MG/ML IV SOLN
2.0000 mg | INTRAVENOUS | Status: DC | PRN
  Administered 2020-09-11: 2 mg via INTRAVENOUS
  Filled 2020-09-11: qty 1

## 2020-09-11 MED ORDER — INSULIN ASPART 100 UNIT/ML IJ SOLN
0.0000 [IU] | INTRAMUSCULAR | Status: DC
  Administered 2020-09-11: 1 [IU] via SUBCUTANEOUS
  Administered 2020-09-11: 2 [IU] via SUBCUTANEOUS
  Administered 2020-09-11: 1 [IU] via SUBCUTANEOUS

## 2020-09-11 MED ORDER — HYDROMORPHONE HCL 1 MG/ML IJ SOLN
0.5000 mg | INTRAMUSCULAR | Status: DC | PRN
  Administered 2020-09-11 – 2020-09-16 (×4): 0.5 mg via INTRAVENOUS
  Filled 2020-09-11 (×5): qty 0.5

## 2020-09-11 NOTE — Progress Notes (Signed)
Progress Note  9 Days Post-Op  Subjective: Patient reports lower abdominal pain. Passing flatus. Denies nausea with CLD.   Objective: Vital signs in last 24 hours: Temp:  [97.5 F (36.4 C)-98.9 F (37.2 C)] 98.9 F (37.2 C) (07/29 0839) Pulse Rate:  [81-102] 98 (07/29 0839) Resp:  [16-36] 18 (07/29 0839) BP: (99-149)/(62-97) 108/72 (07/29 0839) SpO2:  [93 %-100 %] 95 % (07/29 0839) Last BM Date: 09/08/20  Intake/Output from previous day: 07/28 0701 - 07/29 0700 In: 1678.4 [P.O.:240; I.V.:1288.4; IV Piggyback:150] Out: [id number] [Urine:2975; Drains:200] Intake/Output this shift: No intake/output data recorded.  PE: General: pleasant, WD, elderly male who is laying in bed in NAD Heart: regular, rate, and rhythm.   Lungs: CTAB, no wheezes, rhonchi, or rales noted.  Respiratory effort nonlabored Abd: soft, appropriately ttp, ND, +BS, aspiration site with clean dressing, midline wound with healthy granulation tissue present and measures 19 cm x 3.5 cm x 1.5 cm Psych: A&Ox3 with an appropriate affect.    Lab Results:  Recent Labs    09/10/20 0329 09/11/20 0345  WBC 17.8* 16.7*  HGB 9.3* 9.1*  HCT 29.5* 28.4*  PLT 274 289   BMET Recent Labs    09/10/20 0329 09/11/20 0345  NA 133* 132*  K 4.5 4.4  CL 107 103  CO2 22 23  GLUCOSE 118* 115*  BUN 27* 26*  CREATININE 0.72 0.81  CALCIUM 8.7* 8.8*   PT/INR Recent Labs    09/09/20 2325  LABPROT 13.9  INR 1.1   CMP     Component Value Date/Time   NA 132 (L) 09/11/2020 0345   NA 141 12/03/2019 1509   K 4.4 09/11/2020 0345   CL 103 09/11/2020 0345   CO2 23 09/11/2020 0345   GLUCOSE 115 (H) 09/11/2020 0345   GLUCOSE 94 01/10/2006 1108   BUN 26 (H) 09/11/2020 0345   BUN 10 12/03/2019 1509   CREATININE 0.81 09/11/2020 0345   CREATININE 0.84 09/30/2019 1349   CALCIUM 8.8 (L) 09/11/2020 0345   PROT 4.7 (L) 09/10/2020 0329   PROT 6.5 12/03/2019 1509   ALBUMIN 1.8 (L) 09/10/2020 0329   ALBUMIN 4.2 12/03/2019  1509   AST 64 (H) 09/10/2020 0329   ALT 89 (H) 09/10/2020 0329   ALKPHOS 93 09/10/2020 0329   BILITOT 0.8 09/10/2020 0329   BILITOT 0.9 12/03/2019 1509   GFRNONAA >60 09/11/2020 0345   GFRAA 97 12/03/2019 1509   Lipase     Component Value Date/Time   LIPASE 15 08/28/2020 1931       Studies/Results: CT ABDOMEN PELVIS W CONTRAST  Result Date: 09/09/2020 CLINICAL DATA:  Abdominal pain, fever, post-op leukocytosis, prolonged post op ileus EXAM: CT ABDOMEN AND PELVIS WITH CONTRAST TECHNIQUE: Multidetector CT imaging of the abdomen and pelvis was performed using the standard protocol following bolus administration of intravenous contrast. CONTRAST:  172m OMNIPAQUE IOHEXOL 300 MG/ML  SOLN COMPARISON:  CT 08/28/2020 FINDINGS: Lower chest: Bibasilar atelectasis, small left and trace right effusions. Mild paraseptal emphysema. Coronary artery calcifications. Prominent left atrium. Hepatobiliary: No focal liver abnormality is seen. Cholelithiasis no evidence of cholecystitis. Pancreas: Unremarkable. No pancreatic ductal dilatation or surrounding inflammatory changes. Spleen: Normal in size without focal abnormality. Adrenals/Urinary Tract: Adrenal glands are unremarkable. No hydronephrosis. Unchanged right renal cyst. The bladder is decompressed with Foley catheter in place in with intraluminal gas. Stomach/Bowel: Small hiatal hernia. The stomach is within normal limits. There is no evidence of bowel obstruction. Evidence of small bowel resection. Oral contrast  material is noted throughout the small bowel and within the ascending and transverse colon. There is no evidence of contrast leak the appendix is normal. There is extensive postsurgical inflammatory change in the mid to lower central abdomen and anterior pelvis. In the anterior pelvis, there is a heterogeneous collection, on the right side of this collection there is low-density fluid with a fluid fluid level layering posteriorly with more dense  material, and the left anterior pelvis there is a or heterogeneous higher density appearance. This entire area measures approximately 18.0 x 6.8 cm. Vascular/Lymphatic: Aortoiliac atherosclerotic calcifications. No AAA. No lymphadenopathy. Reproductive: Mildly enlarged prostate. Other: Prior midline incision. Musculoskeletal: No acute osseous abnormality. No suspicious lytic or blastic lesions. Multilevel degenerative changes of the spine. IMPRESSION: Large heterogeneous collection in the anterior pelvis measuring 18.0 x 6.8 cm in the axial dimension, favored to represent a hematoma with varying ages of blood products. Given the patient's leukocytosis, an infected hematoma is possible. Recent small-bowel resection with extensive postsurgical inflammatory changes in the central mid abdomen and pelvis. No evidence of anastomotic leak. No evidence of bowel obstruction. These results were called by telephone at the time of interpretation on 09/09/2020 at 9:11 pm to provider Gershon Cull, who verbally acknowledged these results. Electronically Signed   By: Maurine Simmering   On: 09/09/2020 21:13   CT ASPIRATION  Result Date: 09/10/2020 INDICATION: 80 year old with postoperative fluid collections in the anterior pelvis. EXAM: CT-GUIDED ASPIRATION OF PELVIC FLUID COLLECTION MEDICATIONS: Moderate sedation ANESTHESIA/SEDATION: Fentanyl 100 mcg IV; Versed 2.0 mg IV Moderate Sedation Time:  12 minutes The patient was continuously monitored during the procedure by the interventional radiology nurse under my direct supervision. COMPLICATIONS: None immediate. PROCEDURE: Informed written consent was obtained from the patient after a thorough discussion of the procedural risks, benefits and alternatives. All questions were addressed. A timeout was performed prior to the initiation of the procedure. Patient was placed supine. CT images through the lower abdomen and pelvis were obtained. Low density collection in the anterior pelvis  just right of midline was targeted. The right side of the lower abdomen and pelvis was prepped with chlorhexidine and sterile field was created. Skin was anesthetized with 1% lidocaine. Small incision was made. Using CT guidance, an 18 gauge trocar needle was directed into the anterior right pelvic collection and approximately 90 mL of dark red fluid was removed. Follow up CT images were obtained. Needle was removed. Bandage placed over the puncture site. FINDINGS: Low-density collection in the anterior pelvis at midline and just right of midline. Hyperdense collection along the anterior left side of the pelvis compatible with a hematoma. Needle was directed into the low-density right-sided collection. 90 mL of bloody fluid was removed. Collection was partially decompressed at the end of the procedure. IMPRESSION: CT-guided aspiration of the anterior pelvic fluid collection. Findings are compatible with liquefied hematoma. Fluid was sent for culture. Electronically Signed   By: Markus Daft M.D.   On: 09/10/2020 15:39    Anti-infectives: Anti-infectives (From admission, onward)    Start     Dose/Rate Route Frequency Ordered Stop   09/10/20 0600  piperacillin-tazobactam (ZOSYN) IVPB 3.375 g        3.375 g 12.5 mL/hr over 240 Minutes Intravenous Every 8 hours 09/09/20 2204     09/09/20 2300  piperacillin-tazobactam (ZOSYN) IVPB 3.375 g        3.375 g 100 mL/hr over 30 Minutes Intravenous  Once 09/09/20 2204 09/09/20 2307   09/02/20 1845  piperacillin-tazobactam (  ZOSYN) IVPB 3.375 g        3.375 g 12.5 mL/hr over 240 Minutes Intravenous Every 8 hours 09/02/20 1819 09/07/20 1359   09/02/20 1215  ceFAZolin (ANCEF) IVPB 2g/100 mL premix        2 g 200 mL/hr over 30 Minutes Intravenous  Once 09/02/20 1115 09/02/20 1320   08/29/20 0600  cefTRIAXone (ROCEPHIN) 1 g in sodium chloride 0.9 % 100 mL IVPB        1 g 200 mL/hr over 30 Minutes Intravenous Every 24 hours 08/29/20 0204 08/31/20 0730   08/28/20 2300   piperacillin-tazobactam (ZOSYN) IVPB 3.375 g        3.375 g 100 mL/hr over 30 Minutes Intravenous  Once 08/28/20 2245 08/28/20 2342        Assessment/Plan POD 9,  S/P exploratory laparotomy, extensive LOA, SBR x1 09/02/20 Dr. Georgette Dover for closed loop bowel obstruction - tolerating CLD and having bowel function - advance to FLD today and will 1/2 TPN tomorrow  - WBC 16 and pelvic collection as noted below, started on zosyn  - mobilize as tolerated, PT/OT - VAC m/w/f Pelvic Collection - felt to be hematoma, s/p IR aspiration 7/28, cxs pending    FEN - NPO, IVF @ 50 cc/h, TPN, CLD today if no plans for IR drain today VTE - SCDs, heparin gtt  ID - Zosyn for 5 days post-op,  do not need to add any yet.  Await eval of fluid collection   A. Fib on Eliquis - on hold  History of CVA January 2022 UTI - competed course of rocephin Hypertension Hyperlipidemia  LOS: 74 days    Norm Parcel, [facility] 09/11/2020, 11:04 AM Please see [employer]ion for pager number during day hours 7:00am-4:30pm

## 2020-09-11 NOTE — Progress Notes (Addendum)
PHARMACY - TOTAL PARENTERAL NUTRITION CONSULT NOTE  Indication: SBO  Patient Measurements: Height: 6' (182.9 cm) Weight: 96.5 kg (212 lb 11.9 oz) IBW/kg (Calculated) : 77.6 TPN AdjBW (KG): 82.8 Body mass index is 28.85 kg/m. Usual Weight: 97-100 kg  Assessment:  79 YOM presented on 7/15 with acute abdominal pain. CT showed inflamed loop of small bowel LEFT upper quadrant with potential closed loop anatomy. This could progress to high-grade obstruction. Initially treated with conservative management, but abdominal xray shows progressive SB distention consistent with SBO.  Pt denies recent weight loss.  PMH significant for GERD, bowel perforation in 2010, colostomy reversal in 2011 and SBO in 2016. CVA 02/2020, HTN, HLD.  Glucose / Insulin: no hx DM, A1c 5.9%. CBGs well controlled. Used 4 units SSI in past 24 hrs. Electrolytes: low Na, CoCa high normal at 10.46, others WNL Renal: SCr <1, BUN 26 Hepatic: LFTs slightly elevated, tbili / TG WNL. Albumin 1.8. Prealbumin 14.5>8.7 post-op Intake / Output; MIVF: UOP 1.3 ml/kg/hr, NG out. LBM 7/26 after suppository ID: Zosyn D#3 for possible infected abdominal hematoma. GI Imaging:  7/24 Abd x-ray: ordered  7/27 CT: probably pelvic hematoma GI Surgeries / Procedures:   7/20 ex-lap: SBO with closed loop obstruction in LUQ, SBR (~40 cm) with LOA 7/28 - pelvic aspiration (finding consistent with liquefied hematoma)  Central access: 09/02/20 TPN start date: 09/02/20  Nutritional Goals (RD rec 7/29): 2200-2400 kCal, 125-155g AA per day, fluid >/= 2.2 L  Current Nutrition:  TPN (s/p 7d thiamine through 7/28) CLD started 7/28 Boost TID added 7/29  Plan:  Continue TPN at goal rate 100 ml/hr to provide 139g AA and 2313 kCal meeting 100% of needs  Electrolytes in TPN: increase Na to 58mq/L, reduce K slightly to 479m/L, Ca 31m51mL, Mg 7.5 mEq/L, Phos 25 mmol/L, Cl:Ac 1:1 Add standard MVI and trace elements to TPN Reduce SSI to sensitive Q6H -  D/C if CBGs remain well controlled Standard TPN labs Mon/Thurs and PRN, next check on Sunday F/U oral intake/diet advancement, Zosyn LOT/micro data, watch LFTs  William Ormond D. DanMina Norman, BCPS, BCCRedondo Beach29/2022, 8:50 AM  ===========================  Addendum: Received order for 1/2 TPN per Surgery >> order already entered for today, will half TPN on 7/30  William Norman, BCPS, BCC[place]29/2022, 11:48 AM

## 2020-09-11 NOTE — Progress Notes (Signed)
Physical Therapy Treatment Patient Details Name: William Norman MRN: [id number] DOB: May 19, 1940 Today's Date: 09/11/2020    History of Present Illness 80 y.o. male presenting with abdominal pain 08/28/20. +small bowel obstruction, rate-controlled afib; 09/02/20 S/P exploratory laparotomy, extensive LOA, SBR x1    PMH significant for atrial fibrillation on Eliquis, CAD, history of CVA, hypertension, perforated bowel in 2010, colostomy reversal in 2011, and SBO in 2016 that resolved with conservative management, now    PT Comments    Pt continues to make great progress this session.  Pt able to advance gt to hallway distances.  He continues to fatigue quickly.  Pt continues to benefit from aggressive rehab in a post acute setting.  HR better controlled this session.      Follow Up Recommendations  SNF (after CIR denial ( prefers [facility] ))     Equipment Recommendations  None recommended by PT    Recommendations for Other Services       Precautions / Restrictions Precautions Precautions: Fall Precaution Comments: abdominal wound vac, monitor HR, coretrak Restrictions Weight Bearing Restrictions: No Other Position/Activity Restrictions: used pillow for abdominal bracing.    Mobility  Bed Mobility Overal bed mobility: Needs Assistance   Rolling: Min assist Sidelying to sit: Min assist       General bed mobility comments: Decreased assistance to move to edge of bed and rise into sitting.  Heavy use of bed rail noted.    Transfers Overall transfer level: Needs assistance Equipment used: Rolling walker (2 wheeled) Transfers: Sit to/from Stand Sit to Stand: Min assist;From elevated surface         General transfer comment: Min assistance to power up from elevated surface.  Cues for hand placement to and from seated surface.  Remains shaky/unstedy rising into standing.  Ambulation/Gait Ambulation/Gait assistance: Mod assist;+2 safety/equipment Gait Distance  (Feet): 40 Feet Assistive device: Rolling walker (2 wheeled) Gait Pattern/deviations: Step-through pattern;Trunk flexed     General Gait Details: Cues for posture, RW safety and B foot clearance.  Close chair follow for safety.  HR 112 bpm.   Stairs             Wheelchair Mobility    Modified Rankin (Stroke Patients Only)       Balance Overall balance assessment: Needs assistance   Sitting balance-Leahy Scale: Fair Sitting balance - Comments: balance poor until able to move LEs to edge of bed for B LE support.     Standing balance-Leahy Scale: Poor                              Cognition Arousal/Alertness: Awake/alert Behavior During Therapy: WFL for tasks assessed/performed Overall Cognitive Status: Within Functional Limits for tasks assessed                                        Exercises      General Comments        Pertinent Vitals/Pain Pain Assessment: No/denies pain Pain Score: 6  Pain Location: abdomen and L groin Pain Descriptors / Indicators: Guarding;Grimacing;Moaning;Operative site guarding Pain Intervention(s): Monitored during session;Repositioned    Home Living                      Prior Function            PT Goals (current goals  can now be found in the care plan section) Acute Rehab PT Goals Potential to Achieve Goals: Good Progress towards PT goals: Progressing toward goals    Frequency    Min 3X/week      PT Plan Current plan remains appropriate    Co-evaluation              AM-PAC PT "6 Clicks" Mobility   Outcome Measure  Help needed turning from your back to your side while in a flat bed without using bedrails?: A Little Help needed moving from lying on your back to sitting on the side of a flat bed without using bedrails?: A Little Help needed moving to and from a bed to a chair (including a wheelchair)?: A Little Help needed standing up from a chair using your arms  (e.g., wheelchair or bedside chair)?: A Little Help needed to walk in hospital room?: A Little Help needed climbing 3-5 steps with a railing? : A Little 6 Click Score: 18    End of Session   Activity Tolerance: Patient tolerated treatment well Patient left: in chair;with call bell/phone within reach;with chair alarm set Nurse Communication: Mobility status PT Visit Diagnosis: Difficulty in walking, not elsewhere classified (R26.2)     Time: [id number] PT Time Calculation (min) (ACUTE ONLY): 21 min  Charges:  $Therapeutic Activity: 8-22 mins                     William Norman , PTA Acute Rehabilitation Services Pager [phone] Office [phone]    William Norman 09/11/2020, 3:22 PM

## 2020-09-11 NOTE — Progress Notes (Signed)
PROGRESS NOTE    William Norman  [id number] DOB: 16-Feb-1940 DOA: 08/28/2020 PCP: Vivi Barrack, MD   Chief Complain:Abdominal pain  Brief Narrative:  Patient is a 80 year old male with medical history significant for atrial fibrillation on Eliquis, coronary artery disease, history of CVA,, hypertension, perforated bowel in 2010 colostomy status post colostomy reversal in 2011 and small bowel obstruction 2016 that resolved with conservative management who presented with abdominal pain and was admitted on August 28, 2020.  Patient was found to have closed loop bowel obstruction.  General surgery consulted and he underwent expiratory laparotomy, extensive lysis evaluation, small bowel resection on 09/02/2020. Surgery is closely following.  We are waiting for bowel function.  CT abdomen/pelvis follow-up done on 09/09/20 showed large heterogeneous collection in the anterior pelvis consistent with hematoma.  IR consulted  and hematoma was aspirated. Now on clear liquid diet.  PT/OT recommended skilled nursing  facility on discharge  Assessment & Plan:   Principal Problem:   SBO (small bowel obstruction) (HCC) Active Problems:   Essential hypertension   Personal history of DVT (deep vein thrombosis)   Persistent atrial fibrillation (HCC)   CAD (coronary artery disease)   History of ischemic stroke   Recurrent UTI   Protein-calorie malnutrition, severe   1.Small bowel obstruction  Presented with abdominal pain Patient was found to have closed loop bowel obstruction.  General surgery consulted and he underwent expiratory laparotomy, extensive lysis evaluation, small bowel resection on 09/02/2020. Surgery is closely following. Continue TPN He was also on IV antibiotics, discontinued after 5 days course postop. Currently has a wound VAC on the abdominal surgical wound. No BM yet.  He says he is passing flatus now. Has good bowel sounds.Abd less distended today  2.  Hematoma  CT  abdomen/pelvis follow-up done on 09/09/20 showed large( 18 x 6.8 cm) heterogeneous collection in the anterior pelvis consistent with hematoma.  IR consulted and he underwent aspiration of the hematoma. Hemoglobin had dropped from the range of 14-9 in last few days.  Continue to monitor H&H.  We will transfuse if hemoglobin drops less than 7. Continue Zosyn for now  3.  Paroxysmal atrial fibrillation  rate controlled He was on heparin drip, discontinued after finding of hematoma. Continue atenolol on reduced dose  4.  Urinary tract infection  Patient was started on ceftriaxone and he has had 3 doses  5.  History of CVA  / coronary artery disease On statin, beta-blocker and Eliquis . Eliquis on hold.  Continue atenolol  6.  Hypertension Monitor blood pressure.  Blood pressure soft but stable  7.  Leukocytosis WBC still up.  Patient is afebrile.  Continue Zosyn  8.  Debility/deconditioning Patient seen by PT/OT and recommended SNF  on discharge.  TOC consulted  Nutrition Problem: Severe Malnutrition Etiology: acute illness (SBO with no oral intake x 5 days)      DVT prophylaxis:SCD Code Status: Full Family Communication: Called and discussed with the wife on phone on 09/10/20 Status is: Inpatient  Remains inpatient appropriate because:Persistent severe electrolyte disturbances and IV treatments appropriate due to intensity of illness or inability to take PO  Dispo: The patient is from: Home              Anticipated d/c is to: SNF               Patient currently is not medically stable to d/c.   Difficult to place patient No   Consultants: Surgery  Procedures:As above  Antimicrobials:  Anti-infectives (From admission, onward)    Start     Dose/Rate Route Frequency Ordered Stop   09/10/20 0600  piperacillin-tazobactam (ZOSYN) IVPB 3.375 g        3.375 g 12.5 mL/hr over 240 Minutes Intravenous Every 8 hours 09/09/20 2204     09/09/20 2300  piperacillin-tazobactam (ZOSYN)  IVPB 3.375 g        3.375 g 100 mL/hr over 30 Minutes Intravenous  Once 09/09/20 2204 09/09/20 2307   09/02/20 1845  piperacillin-tazobactam (ZOSYN) IVPB 3.375 g        3.375 g 12.5 mL/hr over 240 Minutes Intravenous Every 8 hours 09/02/20 1819 09/07/20 1359   09/02/20 1215  ceFAZolin (ANCEF) IVPB 2g/100 mL premix        2 g 200 mL/hr over 30 Minutes Intravenous  Once 09/02/20 1115 09/02/20 1320   08/29/20 0600  cefTRIAXone (ROCEPHIN) 1 g in sodium chloride 0.9 % 100 mL IVPB        1 g 200 mL/hr over 30 Minutes Intravenous Every 24 hours 08/29/20 0204 08/31/20 0730   08/28/20 2300  piperacillin-tazobactam (ZOSYN) IVPB 3.375 g        3.375 g 100 mL/hr over 30 Minutes Intravenous  Once 08/28/20 2245 08/28/20 2342       Subjective:  Patient seen and examined at bedside this morning.  Hemodynamically stable.  Comfortable today.  Eating his breakfast, clear liquid diet.  Denies any abdomen pain today.  Abdomen is less distended and he feels better.Passing flatus  Objective: Vitals:   09/10/20 2046 09/11/20 0013 09/11/20 0422 09/11/20 0839  BP: 99/62 122/63 110/64 108/72  Pulse: 83 87 89 98  Resp: '18 16 17 18  '$ Temp: 98.7 F (37.1 C) 98.5 F (36.9 C) 98.2 F (36.8 C) 98.9 F (37.2 C)  TempSrc: Oral Oral Oral Oral  SpO2: 95% 95% 93% 95%  Weight:      Height:        Intake/Output Summary (Last 24 hours) at 09/11/2020 1105 Last data filed at 09/11/2020 0534 Gross per 24 hour  Intake 1678.44 ml  Output 2375 ml  Net -696.56 ml   Filed Weights   08/29/20 0100 08/30/20 0501 09/01/20 0457  Weight: 97.1 kg 99.1 kg 96.5 kg    Examination:  General exam: Overall comfortable, not in distress, chronically ill looking HEENT: PERRL Respiratory system:  no wheezes or crackles  Cardiovascular system: Irregularly irregular rhythm.  Gastrointestinal system: Abdomen is nondistended, soft and nontender.  Midline abdominal wound with wound VAC Central nervous system: Alert and  oriented Extremities: No edema, no clubbing ,no cyanosis Skin: No rashes, no ulcers,no icterus      Data Reviewed: I have personally reviewed following labs and imaging studies  CBC: Recent Labs  Lab 09/07/20 0348 09/08/20 0314 09/09/20 0030 09/09/20 2325 09/10/20 0329 09/11/20 0345  WBC 14.2* 16.3* 17.3* 18.3* 17.8* 16.7*  NEUTROABS 11.2*  --   --   --   --  11.3*  HGB 11.2* 10.5* 9.9* 9.6* 9.3* 9.1*  HCT 35.5* 34.1* 32.2* 30.6* 29.5* 28.4*  MCV 94.4 95.3 95.0 94.7 94.9 93.7  PLT 220 249 277 281 274 [id number]   Basic Metabolic Panel: Recent Labs  Lab 09/05/20 0630 09/06/20 1051 09/07/20 0348 09/09/20 0030 09/09/20 2325 09/10/20 0329 09/11/20 0345  NA 141 139 141 136 132* 133* 132*  K 3.4* 4.1 4.2 4.3 4.5 4.5 4.4  CL 107 109 111 110 106 107 103  CO2 '28 23 24 '$ 23  $'22 22 23  'h$ GLUCOSE 106* 125* 108* 121* 113* 118* 115*  BUN 23 35* 35* 27* 28* 27* 26*  CREATININE 0.74 1.36* 0.98 0.69 0.69 0.72 0.81  CALCIUM 8.8* 9.2 8.8* 8.9 8.7* 8.7* 8.8*  MG 2.0 2.1 2.1 2.1  --  2.0  --   PHOS 2.2* 3.7 2.9 3.1  --  3.3  --    GFR: Estimated Creatinine Clearance: 87.7 mL/min (by C-G formula based on SCr of 0.81 mg/dL). Liver Function Tests: Recent Labs  Lab 09/07/20 0348 09/09/20 2325 09/10/20 0329  AST 39 77* 64*  ALT 33 95* 89*  ALKPHOS 58 89 93  BILITOT 0.8 0.8 0.8  PROT 4.6* 4.9* 4.7*  ALBUMIN 1.8* 1.8* 1.8*   No results for input(s): LIPASE, AMYLASE in the last 168 hours. No results for input(s): AMMONIA in the last 168 hours. Coagulation Profile: Recent Labs  Lab 09/09/20 2325  INR 1.1   Cardiac Enzymes: No results for input(s): CKTOTAL, CKMB, CKMBINDEX, TROPONINI in the last 168 hours. BNP (last 3 results) No results for input(s): PROBNP in the last 8760 hours. HbA1C: No results for input(s): HGBA1C in the last 72 hours. CBG: Recent Labs  Lab 09/10/20 0526 09/10/20 1138 09/10/20 1733 09/11/20 0010 09/11/20 0631  GLUCAP 118* 135* 126* 114* 101*   Lipid  Profile: No results for input(s): CHOL, HDL, LDLCALC, TRIG, CHOLHDL, LDLDIRECT in the last 72 hours.  Thyroid Function Tests: No results for input(s): TSH, T4TOTAL, FREET4, T3FREE, THYROIDAB in the last 72 hours. Anemia Panel: No results for input(s): VITAMINB12, FOLATE, FERRITIN, TIBC, IRON, RETICCTPCT in the last 72 hours. Sepsis Labs: No results for input(s): PROCALCITON, LATICACIDVEN in the last 168 hours.  Recent Results (from the past 240 hour(s))  Aerobic/Anaerobic Culture w Gram Stain (surgical/deep wound)     Status: None (Preliminary result)   Collection Time: 09/10/20  1:16 PM   Specimen: Abdomen; Abdominal Fluid  Result Value Ref Range Status   Specimen Description ABDOMEN FLUID  Final   Special Requests ABDOMEN  Final   Gram Stain   Final    ABUNDANT WBC PRESENT, PREDOMINANTLY PMN NO ORGANISMS SEEN Performed at [facility], [address], [place], [place] [address]    Culture PENDING  Incomplete   Report Status PENDING  Incomplete         Radiology Studies: CT ABDOMEN PELVIS W CONTRAST  Result Date: 09/09/2020 CLINICAL DATA:  Abdominal pain, fever, post-op leukocytosis, prolonged post op ileus EXAM: CT ABDOMEN AND PELVIS WITH CONTRAST TECHNIQUE: Multidetector CT imaging of the abdomen and pelvis was performed using the standard protocol following bolus administration of intravenous contrast. CONTRAST:  149m OMNIPAQUE IOHEXOL 300 MG/ML  SOLN COMPARISON:  CT 08/28/2020 FINDINGS: Lower chest: Bibasilar atelectasis, small left and trace right effusions. Mild paraseptal emphysema. Coronary artery calcifications. Prominent left atrium. Hepatobiliary: No focal liver abnormality is seen. Cholelithiasis no evidence of cholecystitis. Pancreas: Unremarkable. No pancreatic ductal dilatation or surrounding inflammatory changes. Spleen: Normal in size without focal abnormality. Adrenals/Urinary Tract: Adrenal glands are unremarkable. No hydronephrosis. Unchanged right renal  cyst. The bladder is decompressed with Foley catheter in place in with intraluminal gas. Stomach/Bowel: Small hiatal hernia. The stomach is within normal limits. There is no evidence of bowel obstruction. Evidence of small bowel resection. Oral contrast material is noted throughout the small bowel and within the ascending and transverse colon. There is no evidence of contrast leak the appendix is normal. There is extensive postsurgical inflammatory change in the mid  to lower central abdomen and anterior pelvis. In the anterior pelvis, there is a heterogeneous collection, on the right side of this collection there is low-density fluid with a fluid fluid level layering posteriorly with more dense material, and the left anterior pelvis there is a or heterogeneous higher density appearance. This entire area measures approximately 18.0 x 6.8 cm. Vascular/Lymphatic: Aortoiliac atherosclerotic calcifications. No AAA. No lymphadenopathy. Reproductive: Mildly enlarged prostate. Other: Prior midline incision. Musculoskeletal: No acute osseous abnormality. No suspicious lytic or blastic lesions. Multilevel degenerative changes of the spine. IMPRESSION: Large heterogeneous collection in the anterior pelvis measuring 18.0 x 6.8 cm in the axial dimension, favored to represent a hematoma with varying ages of blood products. Given the patient's leukocytosis, an infected hematoma is possible. Recent small-bowel resection with extensive postsurgical inflammatory changes in the central mid abdomen and pelvis. No evidence of anastomotic leak. No evidence of bowel obstruction. These results were called by telephone at the time of interpretation on 09/09/2020 at 9:11 pm to provider Gershon Cull, who verbally acknowledged these results. Electronically Signed   By: Maurine Simmering   On: 09/09/2020 21:13   CT ASPIRATION  Result Date: 09/10/2020 INDICATION: 80 year old with postoperative fluid collections in the anterior pelvis. EXAM:  CT-GUIDED ASPIRATION OF PELVIC FLUID COLLECTION MEDICATIONS: Moderate sedation ANESTHESIA/SEDATION: Fentanyl 100 mcg IV; Versed 2.0 mg IV Moderate Sedation Time:  12 minutes The patient was continuously monitored during the procedure by the interventional radiology nurse under my direct supervision. COMPLICATIONS: None immediate. PROCEDURE: Informed written consent was obtained from the patient after a thorough discussion of the procedural risks, benefits and alternatives. All questions were addressed. A timeout was performed prior to the initiation of the procedure. Patient was placed supine. CT images through the lower abdomen and pelvis were obtained. Low density collection in the anterior pelvis just right of midline was targeted. The right side of the lower abdomen and pelvis was prepped with chlorhexidine and sterile field was created. Skin was anesthetized with 1% lidocaine. Small incision was made. Using CT guidance, an 18 gauge trocar needle was directed into the anterior right pelvic collection and approximately 90 mL of dark red fluid was removed. Follow up CT images were obtained. Needle was removed. Bandage placed over the puncture site. FINDINGS: Low-density collection in the anterior pelvis at midline and just right of midline. Hyperdense collection along the anterior left side of the pelvis compatible with a hematoma. Needle was directed into the low-density right-sided collection. 90 mL of bloody fluid was removed. Collection was partially decompressed at the end of the procedure. IMPRESSION: CT-guided aspiration of the anterior pelvic fluid collection. Findings are compatible with liquefied hematoma. Fluid was sent for culture. Electronically Signed   By: Markus Daft M.D.   On: 09/10/2020 15:39        Scheduled Meds:  Chlorhexidine Gluconate Cloth  6 each Topical Daily   feeding supplement  1 Container Oral TID BM   insulin aspart  0-9 Units Subcutaneous Q4H   lidocaine  1 patch  Transdermal Q24H   pantoprazole (PROTONIX) IV  40 mg Intravenous Q24H   sodium chloride flush  10-40 mL Intracatheter Q12H   Continuous Infusions:  sodium chloride 10 mL/hr at 09/02/20 0645   methocarbamol (ROBAXIN) IV 1,000 mg (09/11/20 0529)   piperacillin-tazobactam (ZOSYN)  IV 3.375 g (09/11/20 0528)   TPN ADULT (ION) 100 mL/hr at 09/10/20 1754   TPN ADULT (ION)       LOS: 13 days    Time  spent: 25 mins.More than 50% of that time was spent in counseling and/or coordination of care.      Shelly Coss, MD [facility] P7/29/2022, 11:05 AM

## 2020-09-11 NOTE — NC FL2 (Signed)
[place] LEVEL OF CARE SCREENING TOOL     IDENTIFICATION  Patient Name: William Norman Birthdate: Jul 24, 1940 Sex: male Admission Date (Current Location): 08/28/2020  [facility] and [place] Number:  Herbalist and Address:  [facility], [place] [address] [place], [place] [address]      Provider Number: [id number]  Attending Physician Name and Address:  Shelly Coss, MD  Relative Name and Phone Number:       Current Level of Care: Hospital Recommended Level of Care: [place] Prior Approval Number:    Date Approved/Denied:   PASRR Number: [id number]  Discharge Plan: Home    Current Diagnoses: Patient Active Problem List   Diagnosis Date Noted   Protein-calorie malnutrition, severe 09/03/2020   SBO (small bowel obstruction) ([place]) 08/28/2020   AAA (abdominal aortic aneurysm) ([place]) 03/06/2020   Porokeratosis 02/25/2020   History of ischemic stroke 02/16/2020   Eczema    DVT (deep venous thrombosis) (HCC)    CAD (coronary artery disease)    Recurrent UTI 08/14/2019   Hyperglycemia 07/08/2019   Persistent atrial fibrillation ([place]) 04/10/2018   Chronic dermatitis of hands 10/12/2015   Soft tissue lesion of foot 08/27/2015   BPH associated with nocturia 08/29/2013   CAD (coronary artery disease), native coronary artery    Long term current use of anticoagulant therapy    Personal history of DVT (deep vein thrombosis)    Chronic venous insufficiency    Erectile dysfunction 07/11/2012   Lumbar disc disease    GERD 08/04/2006   Dyslipidemia    Essential hypertension     Orientation RESPIRATION BLADDER Height & Weight     Time, Self, Situation, Place  Normal Continent Weight: 212 lb 11.9 oz (96.5 kg) Height:  6' (182.9 cm)  BEHAVIORAL SYMPTOMS/MOOD NEUROLOGICAL BOWEL NUTRITION STATUS      Continent Diet  AMBULATORY STATUS COMMUNICATION OF NEEDS Skin   Extensive Assist Verbally Normal                        Personal Care Assistance Level of Assistance  Bathing, Feeding, Dressing Bathing Assistance: Limited assistance Feeding assistance: Limited assistance Dressing Assistance: Limited assistance     Functional Limitations Info  Sight, Hearing, Speech Sight Info: Adequate Hearing Info: Adequate Speech Info: Adequate    SPECIAL CARE FACTORS FREQUENCY  PT (By licensed PT), OT (By licensed OT)     PT Frequency: 5x a week OT Frequency: 5x a week            Contractures Contractures Info: Not present    Additional Factors Info  Code Status, Allergies Code Status Info: Full Allergies Info: Simvastatin           Current Medications (09/11/2020):  This is the current hospital active medication list Current Facility-Administered Medications  Medication Dose Route Frequency Provider Last Rate Last Admin   0.9 %  sodium chloride infusion   Intravenous PRN Norm Parcel, PA-C 10 mL/hr at 09/02/20 0645 Infusion Verify at 09/02/20 0645   acetaminophen (TYLENOL) tablet 650 mg  650 mg Oral Q6H PRN Norm Parcel, PA-C       atenolol (TENORMIN) tablet 25 mg  25 mg Oral Daily Shelly Coss, MD   25 mg at 09/11/20 1247   Chlorhexidine Gluconate Cloth 2 % PADS 6 each  6 each Topical Daily Little Ishikawa, MD   6 each at 09/11/20 1335   feeding supplement (BOOST /  RESOURCE BREEZE) liquid 1 Container  1 Container Oral TID BM Shelly Coss, MD   1 Container at 09/11/20 1247   fentaNYL (SUBLIMAZE) 100 MCG/2ML injection            HYDROmorphone (DILAUDID) injection 0.5 mg  0.5 mg Intravenous Q3H PRN Barkley Boards R, PA-C   0.5 mg at 09/11/20 1411   insulin aspart (novoLOG) injection 0-9 Units  0-9 Units Subcutaneous Q4H Tyrone Apple, RPH   2 Units at 09/11/20 1246   labetalol (NORMODYNE) injection 10 mg  10 mg Intravenous Q4H PRN Barkley Boards R, PA-C       lidocaine (LIDODERM) 5 % 1 patch  1 patch Transdermal Q24H Norm Parcel, PA-C   1 patch at 09/09/20 1001    methocarbamol (ROBAXIN) tablet 1,000 mg  1,000 mg Oral TID Norm Parcel, PA-C   1,000 mg at 09/11/20 1540   metoprolol tartrate (LOPRESSOR) injection 5 mg  5 mg Intravenous Q2H PRN Barkley Boards R, PA-C   5 mg at 09/08/20 1418   ondansetron (ZOFRAN) tablet 4 mg  4 mg Oral Q6H PRN Norm Parcel, PA-C       Or   ondansetron [facility]) injection 4 mg  4 mg Intravenous Q6H PRN Barkley Boards R, PA-C   4 mg at 09/09/20 1554   pantoprazole (PROTONIX) injection 40 mg  40 mg Intravenous Q24H Norm Parcel, PA-C   40 mg at 09/11/20 0932   phenol (CHLORASEPTIC) mouth spray 1 spray  1 spray Mouth/Throat PRN Norm Parcel, PA-C   1 spray at 09/08/20 1206   piperacillin-tazobactam (ZOSYN) IVPB 3.375 g  3.375 g Intravenous Q8H Pham, Minh Q, RPH-CPP 12.5 mL/hr at 09/11/20 1430 3.375 g at 09/11/20 1430   sodium chloride flush (NS) 0.9 % injection 10-40 mL  10-40 mL Intracatheter Q12H Little Ishikawa, MD   10 mL at 09/11/20 0938   sodium chloride flush (NS) 0.9 % injection 10-40 mL  10-40 mL Intracatheter PRN Little Ishikawa, MD   20 mL at 09/10/20 1754   TPN ADULT (ION)   Intravenous Continuous TPN Karren Cobble, RPH 100 mL/hr at 09/10/20 1754 New Bag at 09/10/20 1754   TPN ADULT (ION)   Intravenous Continuous TPN Tyrone Apple, RPH       traMADol Veatrice Bourbon) tablet 50-100 mg  50-100 mg Oral Q6H PRN Norm Parcel, PA-C   50 mg at 09/11/20 1540     Discharge Medications: Please see discharge summary for a list of discharge medications.  Relevant Imaging Results:  Relevant Lab Results:   Additional Information SSN: [id number]  Emeterio Reeve, LCSW

## 2020-09-11 NOTE — TOC Initial Note (Signed)
Transition of Care [facility]) - Initial/Assessment Note    Patient Details  Name: William Norman MRN: [id number] Date of Birth: 1940/06/14  Transition of [facility]) CM/SW Contact:    Emeterio Reeve, LCSW Phone Number: 09/11/2020, 3:59 PM  Clinical Narrative:                  CSW received SNF consult. CSW met with pt at bedside. CSW introduced self and explained role at the hospital. Pt reports that PTA  he lived at home with his wife. PT reports he  completes all ADLs.   CSW reviewed PT/OT recommendations for SNF. Pt reports he is OK with SNF. Pt gave CSW permission to fax out to facilities in the area. Pt prefers [facility]. [facility] is not in network with CSW gave pt medicare.gov rating list to review. CSW explained insurance auth process. Pt reports they are covid vaccinated.  CSW will continue to follow.   Expected Discharge Plan: Skilled Nursing Facility Barriers to Discharge: Continued Medical Work up, Ship broker   Patient Goals and CMS Choice Patient states their goals for this hospitalization and ongoing recovery are:: Get better CMS Medicare.gov Compare Post Acute Care list provided to:: Patient Choice offered to / list presented to : Patient  Expected Discharge Plan and Services Expected Discharge Plan: [place]       Living arrangements for the past 2 months: Single Family Home                                      Prior Living Arrangements/Services Living arrangements for the past 2 months: Single Family Home Lives with:: Spouse Patient language and need for interpreter reviewed:: Yes Do you feel safe going back to the place where you live?: Yes      Need for Family Participation in Patient Care: Yes (Comment) Care giver support system in place?: Yes (comment)   Criminal Activity/Legal Involvement Pertinent to Current Situation/Hospitalization: No - Comment as needed  Activities of Daily Living Home Assistive  Devices/Equipment: None ADL Screening (condition at time of admission) Patient's cognitive ability adequate to safely complete daily activities?: Yes Is the patient deaf or have difficulty hearing?: No Does the patient have difficulty seeing, even when wearing glasses/contacts?: No Does the patient have difficulty concentrating, remembering, or making decisions?: No Patient able to express need for assistance with ADLs?: Yes Does the patient have difficulty dressing or bathing?: No Independently performs ADLs?: Yes (appropriate for developmental age) Does the patient have difficulty walking or climbing stairs?: No Weakness of Legs: None Weakness of Arms/Hands: None  Permission Sought/Granted   Permission granted to share information with : Yes, Verbal Permission Granted     Permission granted to share info w AGENCY: SNF        Emotional Assessment Appearance:: Appears stated age Attitude/Demeanor/Rapport: Engaged Affect (typically observed): Appropriate Orientation: : Oriented to Self, Oriented to Place, Oriented to  Time, Oriented to Situation Alcohol / Substance Use: Not Applicable Psych Involvement: No (comment)  Admission diagnosis:  SBO (small bowel obstruction) ([place]) [K56.609] Urinary tract infection with hematuria, site unspecified [N39.0, R31.9] Intestinal obstruction, unspecified cause, unspecified whether partial or complete ([place]) [K56.609] Patient Active Problem List   Diagnosis Date Noted   Protein-calorie malnutrition, severe 09/03/2020   SBO (small bowel obstruction) ([place]) 08/28/2020   AAA (abdominal aortic aneurysm) ([place]) 03/06/2020   Porokeratosis 02/25/2020   History of ischemic  stroke 02/16/2020   Eczema    DVT (deep venous thrombosis) (HCC)    CAD (coronary artery disease)    Recurrent UTI 08/14/2019   Hyperglycemia 07/08/2019   Persistent atrial fibrillation ([place]) 04/10/2018   Chronic dermatitis of hands 10/12/2015   Soft tissue lesion of foot  08/27/2015   BPH associated with nocturia 08/29/2013   CAD (coronary artery disease), native coronary artery    Long term current use of anticoagulant therapy    Personal history of DVT (deep vein thrombosis)    Chronic venous insufficiency    Erectile dysfunction 07/11/2012   Lumbar disc disease    GERD 08/04/2006   Dyslipidemia    Essential hypertension    PCP:  Vivi Barrack, MD Pharmacy:   [id number] [id number]: [phone]: [phone]    Social Determinants of Health (SDOH) Interventions    Readmission Risk Interventions No flowsheet data found.  MEmeterio Reeve LLatanya Presser LLeavenworthSocial Worker [phone]

## 2020-09-11 NOTE — Progress Notes (Signed)
Nutrition Follow-up  DOCUMENTATION CODES:   Severe malnutrition in context of acute illness/injury  INTERVENTION:   -TPN management per pharmacy -Boost Breeze po TID, each supplement provides 250 kcal and 9 grams of protein  -RD will follow for diet advancement and adjust supplement regimen as appropriate  NUTRITION DIAGNOSIS:   Severe Malnutrition related to acute illness (SBO with no oral intake x 5 days) as evidenced by moderate fat depletion, moderate muscle depletion, energy intake < or equal to 50% for > or equal to 5 days.  Ongoing  GOAL:   Patient will meet greater than or equal to 90% of their needs  Met with TPN  MONITOR:   I & O's, Diet advancement, Labs  REASON FOR ASSESSMENT:   Consult New TPN/TNA  ASSESSMENT:   80 yo male admitted with small bowel obstruction. S/P ex lap with LOA, small bowel resection, VAC placement on 7/20. PMH includes perforated diverticulitis, CAD, HLD, HTN, BPH, GERD, A fib, stroke.  7/24- NGT removed 7/28- CT guided aspiration of anterior abdomen/ pelvic fluid collections (90 ml aspirated); findings reveal post-operative hematoma; advanced to clear liquid diet  Reviewed I/O's: -1.5 L x 24 hours and -1.7 L since admission  UOP: 3 L x 24 hours  Drain output: 200 ml x 24 hours  Pt just advanced to clear liquid diet. No documented meal intake to assess at this time.   Pt remains on TPN at 100 ml/hr, which provides 2313 kcals and 139 grams of protein, meeting 100% of estimated kcal and protein needs.   Per CIR notes, recommending SNF at discharge.   Labs reviewed: Na: 132, CBGS: 101-114 (inpatient orders for glycemic control are 0-15 units insulin aspart every 6 hours).    Diet Order:   Diet Order             Diet clear liquid Room service appropriate? Yes; Fluid consistency: Thin  Diet effective now                   EDUCATION NEEDS:   Not appropriate for education at this time  Skin:  Skin Assessment: Skin  Integrity Issues: Skin Integrity Issues:: Wound VAC Wound Vac: surgical incision to abdomen  Last BM:  09/08/20  Height:   Ht Readings from Last 1 Encounters:  08/28/20 6' (1.829 m)    Weight:   Wt Readings from Last 1 Encounters:  09/01/20 96.5 kg    Ideal Body Weight:  80.9 kg  BMI:  Body mass index is 28.85 kg/m.  Estimated Nutritional Needs:   Kcal:  2200-2400  Protein:  125-150 gm  Fluid:  >/= 2.2 L    Loistine Chance, RD, LDN, [place] Registered Dietitian II Certified Diabetes Care and Education Specialist Please refer to [facility] for RD and/or RD on-call/weekend/after hours pager

## 2020-09-12 DIAGNOSIS — K56609 Unspecified intestinal obstruction, unspecified as to partial versus complete obstruction: Secondary | ICD-10-CM | POA: Diagnosis not present

## 2020-09-12 LAB — CBC WITH DIFFERENTIAL/PLATELET
Abs Immature Granulocytes: 1.08 10*3/uL — ABNORMAL HIGH (ref 0.00–0.07)
Basophils Absolute: 0.1 10*3/uL (ref 0.0–0.1)
Basophils Relative: 1 %
Eosinophils Absolute: 0.2 10*3/uL (ref 0.0–0.5)
Eosinophils Relative: 1 %
HCT: 28.8 % — ABNORMAL LOW (ref 39.0–52.0)
Hemoglobin: 9 g/dL — ABNORMAL LOW (ref 13.0–17.0)
Immature Granulocytes: 7 %
Lymphocytes Relative: 12 %
Lymphs Abs: 1.9 10*3/uL (ref 0.7–4.0)
MCH: 29.5 pg (ref 26.0–34.0)
MCHC: 31.3 g/dL (ref 30.0–36.0)
MCV: 94.4 fL (ref 80.0–100.0)
Monocytes Absolute: 1.3 10*3/uL — ABNORMAL HIGH (ref 0.1–1.0)
Monocytes Relative: 8 %
Neutro Abs: 11.2 10*3/uL — ABNORMAL HIGH (ref 1.7–7.7)
Neutrophils Relative %: 71 %
Platelets: 314 10*3/uL (ref 150–400)
RBC: 3.05 MIL/uL — ABNORMAL LOW (ref 4.22–5.81)
RDW: 15 % (ref 11.5–15.5)
WBC: 15.9 10*3/uL — ABNORMAL HIGH (ref 4.0–10.5)
nRBC: 0 % (ref 0.0–0.2)

## 2020-09-12 LAB — GLUCOSE, CAPILLARY
Glucose-Capillary: 115 mg/dL — ABNORMAL HIGH (ref 70–99)
Glucose-Capillary: 118 mg/dL — ABNORMAL HIGH (ref 70–99)
Glucose-Capillary: 120 mg/dL — ABNORMAL HIGH (ref 70–99)
Glucose-Capillary: 128 mg/dL — ABNORMAL HIGH (ref 70–99)
Glucose-Capillary: 133 mg/dL — ABNORMAL HIGH (ref 70–99)
Glucose-Capillary: 139 mg/dL — ABNORMAL HIGH (ref 70–99)

## 2020-09-12 MED ORDER — TRAVASOL 10 % IV SOLN
INTRAVENOUS | Status: AC
  Filled 2020-09-12: qty 696

## 2020-09-12 MED ORDER — WHITE PETROLATUM EX OINT
TOPICAL_OINTMENT | CUTANEOUS | Status: AC
  Administered 2020-09-12: 0.2
  Filled 2020-09-12: qty 28.35

## 2020-09-12 NOTE — Progress Notes (Signed)
PROGRESS NOTE    William Norman  [id number] DOB: Oct 31, 1940 DOA: 08/28/2020 PCP: Vivi Barrack, MD   Chief Complain:Abdominal pain  Brief Narrative:  Patient is a 80 year old male with medical history significant for atrial fibrillation on Eliquis, coronary artery disease, history of CVA,, hypertension, perforated bowel in 2010 colostomy status post colostomy reversal in 2011 and small bowel obstruction 2016 that resolved with conservative management who presented with abdominal pain and was admitted on August 28, 2020.  Patient was found to have closed loop bowel obstruction.  General surgery consulted and he underwent expiratory laparotomy, extensive lysis evaluation, small bowel resection on 09/02/2020. Surgery is closely following.  We are waiting for bowel function.  CT abdomen/pelvis follow-up done on 09/09/20 showed large heterogeneous collection in the anterior pelvis consistent with hematoma.  IR consulted  and hematoma was aspirated. Now on soft diet.  PT/OT recommended skilled nursing  facility on discharge  Assessment & Plan:   Principal Problem:   SBO (small bowel obstruction) (HCC) Active Problems:   Essential hypertension   Personal history of DVT (deep vein thrombosis)   Persistent atrial fibrillation (HCC)   CAD (coronary artery disease)   History of ischemic stroke   Recurrent UTI   Protein-calorie malnutrition, severe   1.Small bowel obstruction  Presented with abdominal pain Patient was found to have closed loop bowel obstruction.  General surgery consulted and he underwent expiratory laparotomy, extensive lysis evaluation, small bowel resection on 09/02/2020. Surgery is closely following. Continue TPN, but on half dose He was also on IV antibiotics, discontinued after 5 days course postop. Currently has a wound VAC on the abdominal surgical wound. No BM yet.  He says he is passing flatus now. Has good bowel sounds.Abd less distended. Started on soft  diet.  2.  Hematoma  CT abdomen/pelvis follow-up done on 09/09/20 showed large( 18 x 6.8 cm) heterogeneous collection in the anterior pelvis consistent with hematoma.  IR consulted and he underwent aspiration of the hematoma. Hemoglobin had dropped from the range of 14-9 in last few days.  Continue to monitor H&H.  We will transfuse if hemoglobin drops less than 7. Continue Zosyn for now, pending culture from aspiration.  Denies any abdominal pain today.  3.  Paroxysmal atrial fibrillation  rate controlled He was on heparin drip, discontinued after finding of hematoma. Continue atenolol on reduced dose Will restart Eliquis when appropriate.  4.  Urinary tract infection  Patient was started on ceftriaxone and he has had 3 doses  5.  History of CVA  / coronary artery disease On statin, beta-blocker and Eliquis . Eliquis on hold.  Continue atenolol  6.  Hypertension Monitor blood pressure.  Blood pressure soft but stable  7.  Leukocytosis WBC still up.  Patient is afebrile.  Continue Zosyn  8.  Debility/deconditioning Patient seen by PT/OT and recommended SNF  on discharge.  TOC consulted.  Patient is agreeable  Nutrition Problem: Severe Malnutrition Etiology: acute illness (SBO with no oral intake x 5 days)      DVT prophylaxis:SCD Code Status: Full Family Communication: Called and discussed with the wife on phone on 09/10/20 Status is: Inpatient  Remains inpatient appropriate because:Persistent severe electrolyte disturbances and IV treatments appropriate due to intensity of illness or inability to take PO  Dispo: The patient is from: Home              Anticipated d/c is to: SNF  Patient currently is not medically stable to d/c.   Difficult to place patient No   Consultants: Surgery  Procedures:As above  Antimicrobials:  Anti-infectives (From admission, onward)    Start     Dose/Rate Route Frequency Ordered Stop   09/10/20 0600  piperacillin-tazobactam  (ZOSYN) IVPB 3.375 g        3.375 g 12.5 mL/hr over 240 Minutes Intravenous Every 8 hours 09/09/20 2204     09/09/20 2300  piperacillin-tazobactam (ZOSYN) IVPB 3.375 g        3.375 g 100 mL/hr over 30 Minutes Intravenous  Once 09/09/20 2204 09/09/20 2307   09/02/20 1845  piperacillin-tazobactam (ZOSYN) IVPB 3.375 g        3.375 g 12.5 mL/hr over 240 Minutes Intravenous Every 8 hours 09/02/20 1819 09/07/20 1359   09/02/20 1215  ceFAZolin (ANCEF) IVPB 2g/100 mL premix        2 g 200 mL/hr over 30 Minutes Intravenous  Once 09/02/20 1115 09/02/20 1320   08/29/20 0600  cefTRIAXone (ROCEPHIN) 1 g in sodium chloride 0.9 % 100 mL IVPB        1 g 200 mL/hr over 30 Minutes Intravenous Every 24 hours 08/29/20 0204 08/31/20 0730   08/28/20 2300  piperacillin-tazobactam (ZOSYN) IVPB 3.375 g        3.375 g 100 mL/hr over 30 Minutes Intravenous  Once 08/28/20 2245 08/28/20 2342       Subjective:  Patient seen and examined at the bedside this morning.  Hemodynamically stable.  Comfortable.  No bowel movement yet.  Passing flatus.  Tolerating full liquid diet.  Denies any abdominal pain, nausea or vomiting  Objective: Vitals:   09/12/20 0425 09/12/20 0853 09/12/20 1121 09/12/20 1141  BP: 133/66 105/67    Pulse: 97 81  89  Resp: 18 (!) 28 (!) 24   Temp: 98.1 F (36.7 C) 98.9 F (37.2 C)    TempSrc: Oral Oral    SpO2: 98% 97%  98%  Weight:      Height:        Intake/Output Summary (Last 24 hours) at 09/12/2020 1155 Last data filed at 09/12/2020 0903 Gross per 24 hour  Intake 1803.28 ml  Output 3500 ml  Net -1696.72 ml   Filed Weights   08/29/20 0100 08/30/20 0501 09/01/20 0457  Weight: 97.1 kg 99.1 kg 96.5 kg    Examination:  General exam: Overall comfortable, not in distress, chronically ill looking HEENT: PERRL Respiratory system:  no wheezes or crackles  Cardiovascular system: Irregularly irregular rhythm.  Gastrointestinal system: Abdomen is nondistended, soft and nontender.   Midline abdominal wound with wound VAC.  Slow bowel sounds heard Central nervous system: Alert and oriented Extremities: No edema, no clubbing ,no cyanosis Skin: No rashes, no ulcers,no icterus      Data Reviewed: I have personally reviewed following labs and imaging studies  CBC: Recent Labs  Lab 09/07/20 0348 09/08/20 0314 09/09/20 0030 09/09/20 2325 09/10/20 0329 09/11/20 0345 09/12/20 0327  WBC 14.2*   < > 17.3* 18.3* 17.8* 16.7* 15.9*  NEUTROABS 11.2*  --   --   --   --  11.3* 11.2*  HGB 11.2*   < > 9.9* 9.6* 9.3* 9.1* 9.0*  HCT 35.5*   < > 32.2* 30.6* 29.5* 28.4* 28.8*  MCV 94.4   < > 95.0 94.7 94.9 93.7 94.4  PLT 220   < > 277 281 274 289 314   < > = values in this interval not displayed.  Basic Metabolic Panel: Recent Labs  Lab 09/06/20 1051 09/07/20 0348 09/09/20 0030 09/09/20 2325 09/10/20 0329 09/11/20 0345  NA 139 141 136 132* 133* 132*  K 4.1 4.2 4.3 4.5 4.5 4.4  CL 109 111 110 106 107 103  CO2 '23 24 23 22 22 23  '$ GLUCOSE 125* 108* 121* 113* 118* 115*  BUN 35* 35* 27* 28* 27* 26*  CREATININE 1.36* 0.98 0.69 0.69 0.72 0.81  CALCIUM 9.2 8.8* 8.9 8.7* 8.7* 8.8*  MG 2.1 2.1 2.1  --  2.0  --   PHOS 3.7 2.9 3.1  --  3.3  --    GFR: Estimated Creatinine Clearance: 87.7 mL/min (by C-G formula based on SCr of 0.81 mg/dL). Liver Function Tests: Recent Labs  Lab 09/07/20 0348 09/09/20 2325 09/10/20 0329  AST 39 77* 64*  ALT 33 95* 89*  ALKPHOS 58 89 93  BILITOT 0.8 0.8 0.8  PROT 4.6* 4.9* 4.7*  ALBUMIN 1.8* 1.8* 1.8*   No results for input(s): LIPASE, AMYLASE in the last 168 hours. No results for input(s): AMMONIA in the last 168 hours. Coagulation Profile: Recent Labs  Lab 09/09/20 2325  INR 1.1   Cardiac Enzymes: No results for input(s): CKTOTAL, CKMB, CKMBINDEX, TROPONINI in the last 168 hours. BNP (last 3 results) No results for input(s): PROBNP in the last 8760 hours. HbA1C: No results for input(s): HGBA1C in the last 72  hours. CBG: Recent Labs  Lab 09/11/20 1549 09/11/20 2013 09/12/20 0006 09/12/20 0422 09/12/20 0848  GLUCAP 126* 123* 115* 120* 133*   Lipid Profile: No results for input(s): CHOL, HDL, LDLCALC, TRIG, CHOLHDL, LDLDIRECT in the last 72 hours.  Thyroid Function Tests: No results for input(s): TSH, T4TOTAL, FREET4, T3FREE, THYROIDAB in the last 72 hours. Anemia Panel: No results for input(s): VITAMINB12, FOLATE, FERRITIN, TIBC, IRON, RETICCTPCT in the last 72 hours. Sepsis Labs: No results for input(s): PROCALCITON, LATICACIDVEN in the last 168 hours.  Recent Results (from the past 240 hour(s))  Aerobic/Anaerobic Culture w Gram Stain (surgical/deep wound)     Status: None (Preliminary result)   Collection Time: 09/10/20  1:16 PM   Specimen: Abdomen; Abdominal Fluid  Result Value Ref Range Status   Specimen Description ABDOMEN FLUID  Final   Special Requests ABDOMEN  Final   Gram Stain   Final    ABUNDANT WBC PRESENT, PREDOMINANTLY PMN NO ORGANISMS SEEN Performed at [facility], [address] [place], [place] [address]    Culture PENDING  Incomplete   Report Status PENDING  Incomplete         Radiology Studies: CT ASPIRATION  Result Date: 09/10/2020 INDICATION: 80 year old with postoperative fluid collections in the anterior pelvis. EXAM: CT-GUIDED ASPIRATION OF PELVIC FLUID COLLECTION MEDICATIONS: Moderate sedation ANESTHESIA/SEDATION: Fentanyl 100 mcg IV; Versed 2.0 mg IV Moderate Sedation Time:  12 minutes The patient was continuously monitored during the procedure by the interventional radiology nurse under my direct supervision. COMPLICATIONS: None immediate. PROCEDURE: Informed written consent was obtained from the patient after a thorough discussion of the procedural risks, benefits and alternatives. All questions were addressed. A timeout was performed prior to the initiation of the procedure. Patient was placed supine. CT images through the lower abdomen and  pelvis were obtained. Low density collection in the anterior pelvis just right of midline was targeted. The right side of the lower abdomen and pelvis was prepped with chlorhexidine and sterile field was created. Skin was anesthetized with 1% lidocaine. Small incision was made. Using  CT guidance, an 18 gauge trocar needle was directed into the anterior right pelvic collection and approximately 90 mL of dark red fluid was removed. Follow up CT images were obtained. Needle was removed. Bandage placed over the puncture site. FINDINGS: Low-density collection in the anterior pelvis at midline and just right of midline. Hyperdense collection along the anterior left side of the pelvis compatible with a hematoma. Needle was directed into the low-density right-sided collection. 90 mL of bloody fluid was removed. Collection was partially decompressed at the end of the procedure. IMPRESSION: CT-guided aspiration of the anterior pelvic fluid collection. Findings are compatible with liquefied hematoma. Fluid was sent for culture. Electronically Signed   By: Markus Daft M.D.   On: 09/10/2020 15:39        Scheduled Meds:  atenolol  25 mg Oral Daily   Chlorhexidine Gluconate Cloth  6 each Topical Daily   feeding supplement  1 Container Oral TID BM   lidocaine  1 patch Transdermal Q24H   methocarbamol  1,000 mg Oral TID   pantoprazole (PROTONIX) IV  40 mg Intravenous Q24H   sodium chloride flush  10-40 mL Intracatheter Q12H   Continuous Infusions:  sodium chloride 10 mL/hr at 09/02/20 0645   piperacillin-tazobactam (ZOSYN)  IV 12.5 mL/hr at 09/12/20 0619   TPN ADULT (ION) 100 mL/hr at 09/12/20 0619   TPN ADULT (ION)       LOS: 14 days    Time spent: 25 mins.More than 50% of that time was spent in counseling and/or coordination of care.      Shelly Coss, MD [facility] P7/30/2022, 11:55 AM

## 2020-09-12 NOTE — Progress Notes (Signed)
PHARMACY - TOTAL PARENTERAL NUTRITION CONSULT NOTE  Indication: SBO  Patient Measurements: Height: 6' (182.9 cm) Weight: 96.5 kg (212 lb 11.9 oz) IBW/kg (Calculated) : 77.6 TPN AdjBW (KG): 82.8 Body mass index is 28.85 kg/m. Usual Weight: 97-100 kg  Assessment:  79 YOM presented on 7/15 with acute abdominal pain. CT showed inflamed loop of small bowel LEFT upper quadrant with potential closed loop anatomy. This could progress to high-grade obstruction. Initially treated with conservative management, but abdominal xray shows progressive SB distention consistent with SBO.  Pt denies recent weight loss.  PMH significant for GERD, bowel perforation in 2010, colostomy reversal in 2011 and SBO in 2016. CVA 02/2020, HTN, HLD.  Glucose / Insulin: no hx DM, A1c 5.9%. CBGs well controlled. Used 4 units SSI in past 24 hrs. Electrolytes: 7/29 labs - low Na, CoCa high normal at 10.46, others WNL Renal: SCr <1, BUN 26 Hepatic: LFTs slightly elevated, tbili / TG WNL. Albumin 1.8. Prealbumin 14.5>8.7 post-op Intake / Output; MIVF: UOP 1.6 ml/kg/hr, drain 155m. LBM 7/26 after suppository ID: Zosyn D#[id number] for possible infected abdominal hematoma. GI Imaging:  7/24 Abd x-ray: ordered  7/27 CT: probably pelvic hematoma GI Surgeries / Procedures:   7/20 ex-lap: SBO with closed loop obstruction in LUQ, SBR (~40 cm) with LOA 7/28 - pelvic aspiration (finding consistent with liquefied hematoma)  Central access: 09/02/20 TPN start date: 09/02/20  Nutritional Goals (RD rec 7/29): 2200-2400 kCal, 125-155g AA per day, fluid >/= 2.2 L  Current Nutrition:  TPN (s/p 7d thiamine through 7/28) Advance to soft diet 7/30 Boost TID - 3 charted given yesterday  Plan:  Reduce TPN to half rate per Surgery - TPN at 50 ml/hr Electrolytes in TPN: Na 1572m/L, K 701mL, Ca 0mE66m, Mg 10mE74m Phos 25mmo25m Cl:Ac 1:1 - max out lytes d/t reduced TPN rate Add standard MVI and trace elements to TPN D/C SSI/CBG  checks Standard TPN labs Mon/Thurs and PRN >> repeat labs in AM F/U PO intake to wean off TPN in AM, Zosyn LOT/micro data  Lashan Gluth D. Makailee Nudelman, Mina MarblemD, BCPS, BCCCP [email or web address], 9:15 AM

## 2020-09-12 NOTE — Progress Notes (Signed)
Pharmacy Antibiotic Note  William Norman is a 80 y.o. male admitted on 08/28/2020 with  intra-abdominal infection .  Pharmacy has been consulted for zosyn dosing.  Pt with complicated case of intra-abdominal infection. He received 5 days of zosyn post op. He continues to have leukocytosis, afebrile. Repeat CT still showing large collection. Zosyn restarted on 7/27.  CrCl~88 ml/min  Plan: Continue Zosyn 3.375g IV q8h over 4 hrs Hematoma fluid aspirated on 7/28- cx pending Continue following clinical course, micro data, and renal function   Height: 6' (182.9 cm) Weight: 96.5 kg (212 lb 11.9 oz) IBW/kg (Calculated) : 77.6  Temp (24hrs), Avg:98.7 F (37.1 C), Min:98.1 F (36.7 C), Max:99.3 F (37.4 C)  Recent Labs  Lab 09/07/20 0348 09/08/20 0314 09/09/20 0030 09/09/20 2325 09/10/20 0329 09/11/20 0345 09/12/20 0327  WBC 14.2*   < > 17.3* 18.3* 17.8* 16.7* 15.9*  CREATININE 0.98  --  0.69 0.69 0.72 0.81  --    < > = values in this interval not displayed.     Estimated Creatinine Clearance: 87.7 mL/min (by C-G formula based on SCr of 0.81 mg/dL).    Allergies  Allergen Reactions   Other Other (See Comments)    Some form of numbing medication used by dentist, almost died   Simvastatin Other (See Comments)    Muscle aches     Antimicrobials this admission: CTX 7/16>7/18 Zosyn 7/20 >> (7/25) 7/27>>  Dose adjustments this admission: No adjustments needed, renal function stable  Microbiology results: 7/15 ucx: mult spp 7/15 bcx: neg 7/16 bcx: neg 7/19: MRSA PCR negative 7/28 Abdominal Fluid: pending   Lestine Box, PharmD PGY2 Infectious Diseases Pharmacy Resident   Please check [email or web address] for unit-specific pharmacy phone numbers

## 2020-09-12 NOTE — Progress Notes (Signed)
Progress Note  10 Days Post-Op  Subjective: Patient reports no abdominal pain. Passing flatus, denies bm yet. Denies nausea or vomiting; tolerating liquids without difficulty.  Objective: Vital signs in last 24 hours: Temp:  [98.1 F (36.7 C)-99.3 F (37.4 C)] 98.1 F (36.7 C) (07/30 0425) Pulse Rate:  [65-97] 97 (07/30 0425) Resp:  [16-18] 18 (07/30 0425) BP: (99-133)/(61-86) 133/66 (07/30 0425) SpO2:  [95 %-98 %] 98 % (07/30 0425) Last BM Date: 09/08/20  Intake/Output from previous day: 07/29 0701 - 07/30 0700 In: 2043.3 [P.O.:640; I.V.:1244.9; IV Piggyback:158.4] Out: 3950 [Urine:3800; Drains:150] Intake/Output this shift: No intake/output data recorded.  PE: General: pleasant, WD, elderly male who is laying in bed in NAD Heart: rrr Lungs: normal work of breathing.  Respiratory effort nonlabored Abd: soft, nontender, nondistended; wound VAC in place Psych: A&Ox3 with an appropriate affect.    Lab Results:  Recent Labs    09/11/20 0345 09/12/20 0327  WBC 16.7* 15.9*  HGB 9.1* 9.0*  HCT 28.4* 28.8*  PLT 289 314   BMET Recent Labs    09/10/20 0329 09/11/20 0345  NA 133* 132*  K 4.5 4.4  CL 107 103  CO2 22 23  GLUCOSE 118* 115*  BUN 27* 26*  CREATININE 0.72 0.81  CALCIUM 8.7* 8.8*   PT/INR Recent Labs    09/09/20 2325  LABPROT 13.9  INR 1.1   CMP     Component Value Date/Time   NA 132 (L) 09/11/2020 0345   NA 141 12/03/2019 1509   K 4.4 09/11/2020 0345   CL 103 09/11/2020 0345   CO2 23 09/11/2020 0345   GLUCOSE 115 (H) 09/11/2020 0345   GLUCOSE 94 01/10/2006 1108   BUN 26 (H) 09/11/2020 0345   BUN 10 12/03/2019 1509   CREATININE 0.81 09/11/2020 0345   CREATININE 0.84 09/30/2019 1349   CALCIUM 8.8 (L) 09/11/2020 0345   PROT 4.7 (L) 09/10/2020 0329   PROT 6.5 12/03/2019 1509   ALBUMIN 1.8 (L) 09/10/2020 0329   ALBUMIN 4.2 12/03/2019 1509   AST 64 (H) 09/10/2020 0329   ALT 89 (H) 09/10/2020 0329   ALKPHOS 93 09/10/2020 0329    BILITOT 0.8 09/10/2020 0329   BILITOT 0.9 12/03/2019 1509   GFRNONAA >60 09/11/2020 0345   GFRAA 97 12/03/2019 1509   Lipase     Component Value Date/Time   LIPASE 15 08/28/2020 1931       Studies/Results: CT ASPIRATION  Result Date: 09/10/2020 INDICATION: 80 year old with postoperative fluid collections in the anterior pelvis. EXAM: CT-GUIDED ASPIRATION OF PELVIC FLUID COLLECTION MEDICATIONS: Moderate sedation ANESTHESIA/SEDATION: Fentanyl 100 mcg IV; Versed 2.0 mg IV Moderate Sedation Time:  12 minutes The patient was continuously monitored during the procedure by the interventional radiology nurse under my direct supervision. COMPLICATIONS: None immediate. PROCEDURE: Informed written consent was obtained from the patient after a thorough discussion of the procedural risks, benefits and alternatives. All questions were addressed. A timeout was performed prior to the initiation of the procedure. Patient was placed supine. CT images through the lower abdomen and pelvis were obtained. Low density collection in the anterior pelvis just right of midline was targeted. The right side of the lower abdomen and pelvis was prepped with chlorhexidine and sterile field was created. Skin was anesthetized with 1% lidocaine. Small incision was made. Using CT guidance, an 18 gauge trocar needle was directed into the anterior right pelvic collection and approximately 90 mL of dark red fluid was removed. Follow up CT images were  obtained. Needle was removed. Bandage placed over the puncture site. FINDINGS: Low-density collection in the anterior pelvis at midline and just right of midline. Hyperdense collection along the anterior left side of the pelvis compatible with a hematoma. Needle was directed into the low-density right-sided collection. 90 mL of bloody fluid was removed. Collection was partially decompressed at the end of the procedure. IMPRESSION: CT-guided aspiration of the anterior pelvic fluid collection.  Findings are compatible with liquefied hematoma. Fluid was sent for culture. Electronically Signed   By: Markus Daft M.D.   On: 09/10/2020 15:39    Anti-infectives: Anti-infectives (From admission, onward)    Start     Dose/Rate Route Frequency Ordered Stop   09/10/20 0600  piperacillin-tazobactam (ZOSYN) IVPB 3.375 g        3.375 g 12.5 mL/hr over 240 Minutes Intravenous Every 8 hours 09/09/20 2204     09/09/20 2300  piperacillin-tazobactam (ZOSYN) IVPB 3.375 g        3.375 g 100 mL/hr over 30 Minutes Intravenous  Once 09/09/20 2204 09/09/20 2307   09/02/20 1845  piperacillin-tazobactam (ZOSYN) IVPB 3.375 g        3.375 g 12.5 mL/hr over 240 Minutes Intravenous Every 8 hours 09/02/20 1819 09/07/20 1359   09/02/20 1215  ceFAZolin (ANCEF) IVPB 2g/100 mL premix        2 g 200 mL/hr over 30 Minutes Intravenous  Once 09/02/20 1115 09/02/20 1320   08/29/20 0600  cefTRIAXone (ROCEPHIN) 1 g in sodium chloride 0.9 % 100 mL IVPB        1 g 200 mL/hr over 30 Minutes Intravenous Every 24 hours 08/29/20 0204 08/31/20 0730   08/28/20 2300  piperacillin-tazobactam (ZOSYN) IVPB 3.375 g        3.375 g 100 mL/hr over 30 Minutes Intravenous  Once 08/28/20 2245 08/28/20 2342        Assessment/Plan POD 10,  S/P exploratory laparotomy, extensive LOA, SBR x1 09/02/20 Dr. Georgette Dover for closed loop bowel obstruction - Adv to soft diet today; ok to 1/2 TPN today  - WBC 15.9 and pelvic collection as noted below, started on zosyn  - mobilize as tolerated, PT/OT - VAC m/w/f Pelvic Collection - felt to be hematoma, s/p IR aspiration 7/28, cxs pending    FEN - adv to soft diet, IVF @ 50 cc/h, TPN VTE - SCDs, heparin gtt  ID - Zosyn for 5 days post-op,  do not need to add any yet.  Await eval of fluid collection   A. Fib on Eliquis - on hold; on hep gtt History of CVA January 2022 UTI - competed course of rocephin Hypertension Hyperlipidemia   LOS: 68 days   Nadeen Landau, MD [facility]  Surgery Use [email or web address] to contact on call provider

## 2020-09-13 DIAGNOSIS — K56609 Unspecified intestinal obstruction, unspecified as to partial versus complete obstruction: Secondary | ICD-10-CM | POA: Diagnosis not present

## 2020-09-13 LAB — CBC WITH DIFFERENTIAL/PLATELET
Abs Immature Granulocytes: 0.76 10*3/uL — ABNORMAL HIGH (ref 0.00–0.07)
Basophils Absolute: 0.1 10*3/uL (ref 0.0–0.1)
Basophils Relative: 1 %
Eosinophils Absolute: 0.2 10*3/uL (ref 0.0–0.5)
Eosinophils Relative: 1 %
HCT: 28.4 % — ABNORMAL LOW (ref 39.0–52.0)
Hemoglobin: 9.1 g/dL — ABNORMAL LOW (ref 13.0–17.0)
Immature Granulocytes: 5 %
Lymphocytes Relative: 12 %
Lymphs Abs: 1.8 10*3/uL (ref 0.7–4.0)
MCH: 29.5 pg (ref 26.0–34.0)
MCHC: 32 g/dL (ref 30.0–36.0)
MCV: 92.2 fL (ref 80.0–100.0)
Monocytes Absolute: 1.2 10*3/uL — ABNORMAL HIGH (ref 0.1–1.0)
Monocytes Relative: 8 %
Neutro Abs: 10.3 10*3/uL — ABNORMAL HIGH (ref 1.7–7.7)
Neutrophils Relative %: 73 %
Platelets: 365 10*3/uL (ref 150–400)
RBC: 3.08 MIL/uL — ABNORMAL LOW (ref 4.22–5.81)
RDW: 15 % (ref 11.5–15.5)
WBC: 14.3 10*3/uL — ABNORMAL HIGH (ref 4.0–10.5)
nRBC: 0 % (ref 0.0–0.2)

## 2020-09-13 LAB — MAGNESIUM: Magnesium: 2.3 mg/dL (ref 1.7–2.4)

## 2020-09-13 LAB — BASIC METABOLIC PANEL
Anion gap: 5 (ref 5–15)
BUN: 24 mg/dL — ABNORMAL HIGH (ref 8–23)
CO2: 23 mmol/L (ref 22–32)
Calcium: 8.6 mg/dL — ABNORMAL LOW (ref 8.9–10.3)
Chloride: 105 mmol/L (ref 98–111)
Creatinine, Ser: 0.78 mg/dL (ref 0.61–1.24)
GFR, Estimated: >60 mL/min (ref >60)
Glucose, Bld: 120 mg/dL — ABNORMAL HIGH (ref 70–99)
Potassium: 4.4 mmol/L (ref 3.5–5.1)
Sodium: 133 mmol/L — ABNORMAL LOW (ref 135–145)

## 2020-09-13 LAB — PHOSPHORUS: Phosphorus: 3.3 mg/dL (ref 2.5–4.6)

## 2020-09-13 LAB — GLUCOSE, CAPILLARY
Glucose-Capillary: 122 mg/dL — ABNORMAL HIGH (ref 70–99)
Glucose-Capillary: 127 mg/dL — ABNORMAL HIGH (ref 70–99)
Glucose-Capillary: 112 mg/dL — ABNORMAL HIGH (ref 70–99)
Glucose-Capillary: 136 mg/dL — ABNORMAL HIGH (ref 70–99)
Glucose-Capillary: 119 mg/dL — ABNORMAL HIGH (ref 70–99)

## 2020-09-13 MED ORDER — TRAVASOL 10 % IV SOLN
INTRAVENOUS | Status: AC
  Filled 2020-09-13: qty 696

## 2020-09-13 NOTE — Progress Notes (Signed)
Progress Note  11 Days Post-Op  Subjective: Patient reports no abdominal pain. Passing flatus, having bms now. Denies nausea or vomiting; tolerating some of soft but issues with eating due to lack of teeth - has not worn dentures in 2 months.  Objective: Vital signs in last 24 hours: Temp:  [97.7 F (36.5 C)-99.3 F (37.4 C)] 98.4 F (36.9 C) (07/31 0410) Pulse Rate:  [78-93] 93 (07/31 0410) Resp:  [17-28] 18 (07/31 0410) BP: (105-135)/(62-74) 118/73 (07/31 0410) SpO2:  [95 %-98 %] 96 % (07/31 0410) Last BM Date: 09/08/20  Intake/Output from previous day: 07/30 0701 - 07/31 0700 In: 1100.1 [P.O.:420; I.V.:538.5; IV Piggyback:141.6] Out: 2850 [Urine:2850] Intake/Output this shift: No intake/output data recorded.  PE: General: pleasant, WD, elderly male who is laying in bed in NAD Heart: rrr Lungs: normal work of breathing.  Respiratory effort nonlabored Abd: soft, nontender, nondistended; wound VAC in place Psych: A&Ox3 with an appropriate affect.    Lab Results:  Recent Labs    09/12/20 0327 09/13/20 0021  WBC 15.9* 14.3*  HGB 9.0* 9.1*  HCT 28.8* 28.4*  PLT 314 365   BMET Recent Labs    09/11/20 0345 09/13/20 0021  NA 132* 133*  K 4.4 4.4  CL 103 105  CO2 23 23  GLUCOSE 115* 120*  BUN 26* 24*  CREATININE 0.81 0.78  CALCIUM 8.8* 8.6*   PT/INR No results for input(s): LABPROT, INR in the last 72 hours.  CMP     Component Value Date/Time   NA 133 (L) 09/13/2020 0021   NA 141 12/03/2019 1509   K 4.4 09/13/2020 0021   CL 105 09/13/2020 0021   CO2 23 09/13/2020 0021   GLUCOSE 120 (H) 09/13/2020 0021   GLUCOSE 94 01/10/2006 1108   BUN 24 (H) 09/13/2020 0021   BUN 10 12/03/2019 1509   CREATININE 0.78 09/13/2020 0021   CREATININE 0.84 09/30/2019 1349   CALCIUM 8.6 (L) 09/13/2020 0021   PROT 4.7 (L) 09/10/2020 0329   PROT 6.5 12/03/2019 1509   ALBUMIN 1.8 (L) 09/10/2020 0329   ALBUMIN 4.2 12/03/2019 1509   AST 64 (H) 09/10/2020 0329   ALT 89  (H) 09/10/2020 0329   ALKPHOS 93 09/10/2020 0329   BILITOT 0.8 09/10/2020 0329   BILITOT 0.9 12/03/2019 1509   GFRNONAA >60 09/13/2020 0021   GFRAA 97 12/03/2019 1509   Lipase     Component Value Date/Time   LIPASE 15 08/28/2020 1931       Studies/Results: No results found.  Anti-infectives: Anti-infectives (From admission, onward)    Start     Dose/Rate Route Frequency Ordered Stop   09/10/20 0600  piperacillin-tazobactam (ZOSYN) IVPB 3.375 g        3.375 g 12.5 mL/hr over 240 Minutes Intravenous Every 8 hours 09/09/20 2204     09/09/20 2300  piperacillin-tazobactam (ZOSYN) IVPB 3.375 g        3.375 g 100 mL/hr over 30 Minutes Intravenous  Once 09/09/20 2204 09/09/20 2307   09/02/20 1845  piperacillin-tazobactam (ZOSYN) IVPB 3.375 g        3.375 g 12.5 mL/hr over 240 Minutes Intravenous Every 8 hours 09/02/20 1819 09/07/20 1359   09/02/20 1215  ceFAZolin (ANCEF) IVPB 2g/100 mL premix        2 g 200 mL/hr over 30 Minutes Intravenous  Once 09/02/20 1115 09/02/20 1320   08/29/20 0600  cefTRIAXone (ROCEPHIN) 1 g in sodium chloride 0.9 % 100 mL IVPB  1 g 200 mL/hr over 30 Minutes Intravenous Every 24 hours 08/29/20 0204 08/31/20 0730   08/28/20 2300  piperacillin-tazobactam (ZOSYN) IVPB 3.375 g        3.375 g 100 mL/hr over 30 Minutes Intravenous  Once 08/28/20 2245 08/28/20 2342        Assessment/Plan POD 11,  S/P exploratory laparotomy, extensive LOA, SBR x1 09/02/20 Dr. Georgette Dover for closed loop bowel obstruction - Adv to soft diet today; ok to 1/2 TPN today  - WBC down to 14; pelvic collection as noted below, started on zosyn  - mobilize as tolerated, PT/OT - VAC m/w/f Pelvic Collection - felt to be hematoma, s/p IR aspiration 7/28, abundant wbc but no organisms seen. NG x2d. Cxs in progress for 5d.   FEN - change soft to pureed. IVF @ 50 cc/h, half rate tpn VTE - SCDs, heparin gtt  ID - Zosyn for 5 days post-op,  do not need to add any yet.  Await eval of  fluid collection   A. Fib - ok to restart eliquis, d/c heparin gtt once this is done though please History of CVA January 2022 UTI - competed course of rocephin Hypertension Hyperlipidemia   LOS: 59 days   Nadeen Landau, MD [facility] Surgery Use [email or web address] to contact on call provider

## 2020-09-13 NOTE — Progress Notes (Signed)
PHARMACY - TOTAL PARENTERAL NUTRITION CONSULT NOTE  Indication: SBO  Patient Measurements: Height: 6' (182.9 cm) Weight: 96.5 kg (212 lb 11.9 oz) IBW/kg (Calculated) : 77.6 TPN AdjBW (KG): 82.8 Body mass index is 28.85 kg/m. Usual Weight: 97-100 kg  Assessment:  79 YOM presented on 7/15 with acute abdominal pain. CT showed inflamed loop of small bowel LEFT upper quadrant with potential closed loop anatomy. This could progress to high-grade obstruction. Initially treated with conservative management, but abdominal xray shows progressive SB distention consistent with SBO.  Pt denies recent weight loss.  PMH significant for GERD, bowel perforation in 2010, colostomy reversal in 2011 and SBO in 2016. CVA 02/2020, HTN, HLD.  Glucose / Insulin: no hx DM, A1c 5.9%. CBGs well controlled. SSI/CBG checks D/C'ed 7/30 Electrolytes: low Na (max in reduced rate TPN), CoCa high normal at 10.36 (none in TPN), others WNL Renal: SCr <1, BUN 24 Hepatic: LFTs slightly elevated, tbili / TG WNL. Albumin 1.8. Prealbumin 14.5>8.7 post-op Intake / Output; MIVF: UOP 1.6 ml/kg/hr, drain 159m. LBM 7/26 after suppository ID: Zosyn for possible infected abdominal hematoma (abx since 7/15) GI Imaging:  7/24 Abd x-ray: ordered  7/27 CT: probably pelvic hematoma GI Surgeries / Procedures:   7/20 ex-lap: SBO with closed loop obstruction in LUQ, SBR (~40 cm) with LOA 7/28 - pelvic aspiration (finding consistent with liquefied hematoma)  Central access: 09/02/20 TPN start date: 09/02/20  Nutritional Goals (RD rec 7/29): 2200-2400 kCal, 125-155g AA per day, fluid >/= 2.2 L  Current Nutrition:  TPN (s/p 7d thiamine through 7/28) Change to pureed diet 7/31 (no dentures) Boost TID - 3 charted given yesterday  Plan:  Continue half rate TPN per Surgery - TPN at 50 ml/hr Electrolytes in TPN: Na 1546m/L, K 7044mL, Ca 0mE64m, reduce Mg 8mEq45m Phos 25mmo91m Cl:Ac 1:1  Add standard MVI and trace elements to  TPN Standard TPN labs Mon/Thurs  F/U PO intake to wean off of TPN, Zosyn LOT/micro data  Aireonna Bauer D. Elmarie Devlin, Mina MarblemD, BCPS, BCCCP [place]2022, 9:06 AM

## 2020-09-13 NOTE — Progress Notes (Signed)
PROGRESS NOTE    William Norman  [id number] DOB: 1940/03/03 DOA: 08/28/2020 PCP: Vivi Barrack, MD   Chief Complain:Abdominal pain  Brief Narrative:  Patient is a 80 year old male with medical history significant for atrial fibrillation on Eliquis, coronary artery disease, history of CVA,, hypertension, perforated bowel in 2010 colostomy status post colostomy reversal in 2011 and small bowel obstruction 2016 that resolved with conservative management who presented with abdominal pain and was admitted on August 28, 2020.  Patient was found to have closed loop bowel obstruction.  General surgery consulted and he underwent expiratory laparotomy, extensive lysis evaluation, small bowel resection on 09/02/2020. Surgery is closely following.  We are waiting for bowel function.  CT abdomen/pelvis follow-up done on 09/09/20 showed large heterogeneous collection in the anterior pelvis consistent with hematoma.  IR consulted  and hematoma was aspirated. Now on dysphagia 1.  PT/OT recommended skilled nursing  facility on discharge  Assessment & Plan:   Principal Problem:   SBO (small bowel obstruction) (HCC) Active Problems:   Essential hypertension   Personal history of DVT (deep vein thrombosis)   Persistent atrial fibrillation (HCC)   CAD (coronary artery disease)   History of ischemic stroke   Recurrent UTI   Protein-calorie malnutrition, severe   1.Small bowel obstruction  Presented with abdominal pain Patient was found to have closed loop bowel obstruction.  General surgery consulted and he underwent expiratory laparotomy, extensive lysis evaluation, small bowel resection on 09/02/2020. Surgery is closely following. Continue TPN, but on half dose He was also on IV antibiotics, was discontinued after 5 days course postop. Currently has a wound VAC on the abdominal surgical wound. No BM yet.  He says he is passing flatus now. Has good bowel sounds.Abd not  distended. Started on  dysphagia 1diet.  2.  Hematoma  CT abdomen/pelvis follow-up done on 09/09/20 showed large( 18 x 6.8 cm) heterogeneous collection in the anterior pelvis consistent with hematoma.  IR consulted and he underwent aspiration of the hematoma. Hemoglobin had dropped from the range of 14-9 in last few days.  Continue to monitor H&H.  We will transfuse if hemoglobin drops less than 7. Continue Zosyn for now, pending culture from aspiration.  Denies any abdominal pain today.  3.  Paroxysmal atrial fibrillation  rate controlled He was on heparin drip, discontinued after finding of hematoma. Continue atenolol on reduced dose Will restart Eliquis when appropriate.Currently he is in NSR  4.  Urinary tract infection  Patient was started on ceftriaxone and he has had 3 doses  5.  History of CVA  / coronary artery disease On statin, beta-blocker and Eliquis . Eliquis on hold.  Continue atenolol  6.  Hypertension Monitor blood pressure.  Blood pressure soft but stable  7.  Leukocytosis WBC still up.  Patient is afebrile.  Continue Zosyn  8.  Debility/deconditioning Patient seen by PT/OT and recommended SNF  on discharge.  TOC consulted.  Patient is agreeable  Nutrition Problem: Severe Malnutrition Etiology: acute illness (SBO with no oral intake x 5 days)      DVT prophylaxis:SCD Code Status: Full Family Communication: Called and discussed with the wife on phone on 09/10/20 Status is: Inpatient  Remains inpatient appropriate because:Persistent severe electrolyte disturbances and IV treatments appropriate due to intensity of illness or inability to take PO  Dispo: The patient is from: Home              Anticipated d/c is to: SNF  Patient currently is not medically stable to d/c.   Difficult to place patient No   Consultants: Surgery  Procedures:As above  Antimicrobials:  Anti-infectives (From admission, onward)    Start     Dose/Rate Route Frequency Ordered Stop    09/10/20 0600  piperacillin-tazobactam (ZOSYN) IVPB 3.375 g        3.375 g 12.5 mL/hr over 240 Minutes Intravenous Every 8 hours 09/09/20 2204     09/09/20 2300  piperacillin-tazobactam (ZOSYN) IVPB 3.375 g        3.375 g 100 mL/hr over 30 Minutes Intravenous  Once 09/09/20 2204 09/09/20 2307   09/02/20 1845  piperacillin-tazobactam (ZOSYN) IVPB 3.375 g        3.375 g 12.5 mL/hr over 240 Minutes Intravenous Every 8 hours 09/02/20 1819 09/07/20 1359   09/02/20 1215  ceFAZolin (ANCEF) IVPB 2g/100 mL premix        2 g 200 mL/hr over 30 Minutes Intravenous  Once 09/02/20 1115 09/02/20 1320   08/29/20 0600  cefTRIAXone (ROCEPHIN) 1 g in sodium chloride 0.9 % 100 mL IVPB        1 g 200 mL/hr over 30 Minutes Intravenous Every 24 hours 08/29/20 0204 08/31/20 0730   08/28/20 2300  piperacillin-tazobactam (ZOSYN) IVPB 3.375 g        3.375 g 100 mL/hr over 30 Minutes Intravenous  Once 08/28/20 2245 08/28/20 2342       Subjective:  Patient seen and examined at bedside this morning.  Remains comfortable.  Tolerating diet.  Passing gas but no bowel movement yet.  Denies any abdomen pain  Objective: Vitals:   09/12/20 1659 09/12/20 1700 09/12/20 2002 09/13/20 0410  BP: 126/62  135/74 118/73  Pulse: 78  90 93  Resp:  (!) '24 17 18  '$ Temp: 97.7 F (36.5 C)  99.3 F (37.4 C) 98.4 F (36.9 C)  TempSrc: Oral  Oral Oral  SpO2: 95%  98% 96%  Weight:      Height:        Intake/Output Summary (Last 24 hours) at 09/13/2020 0853 Last data filed at 09/13/2020 0506 Gross per 24 hour  Intake 1100.08 ml  Output 2850 ml  Net -1749.92 ml   Filed Weights   08/29/20 0100 08/30/20 0501 09/01/20 0457  Weight: 97.1 kg 99.1 kg 96.5 kg    Examination:  General exam: Overall comfortable, not in distress,deconditioned/debilitated HEENT: PERRL Respiratory system:  no wheezes or crackles  Cardiovascular system: S1 & S2 heard, RRR.  Gastrointestinal system: Abdomen is nondistended, soft and  nontender.Midline abd surgical wound with wound vac Central nervous system: Alert and oriented Extremities: No edema, no clubbing ,no cyanosis Skin: No rashes, no ulcers,no icterus       Data Reviewed: I have personally reviewed following labs and imaging studies  CBC: Recent Labs  Lab 09/07/20 0348 09/08/20 0314 09/09/20 2325 09/10/20 0329 09/11/20 0345 09/12/20 0327 09/13/20 0021  WBC 14.2*   < > 18.3* 17.8* 16.7* 15.9* 14.3*  NEUTROABS 11.2*  --   --   --  11.3* 11.2* 10.3*  HGB 11.2*   < > 9.6* 9.3* 9.1* 9.0* 9.1*  HCT 35.5*   < > 30.6* 29.5* 28.4* 28.8* 28.4*  MCV 94.4   < > 94.7 94.9 93.7 94.4 92.2  PLT 220   < > 281 274 289 314 365   < > = values in this interval not displayed.   Basic Metabolic Panel: Recent Labs  Lab 09/06/20 1051 09/07/20 0348 09/09/20  0030 09/09/20 2325 09/10/20 0329 09/11/20 0345 09/13/20 0021  NA 139 141 136 132* 133* 132* 133*  K 4.1 4.2 4.3 4.5 4.5 4.4 4.4  CL 109 111 110 106 107 103 105  CO2 '23 24 23 22 22 23 23  '$ GLUCOSE 125* 108* 121* 113* 118* 115* 120*  BUN 35* 35* 27* 28* 27* 26* 24*  CREATININE 1.36* 0.98 0.69 0.69 0.72 0.81 0.78  CALCIUM 9.2 8.8* 8.9 8.7* 8.7* 8.8* 8.6*  MG 2.1 2.1 2.1  --  2.0  --  2.3  PHOS 3.7 2.9 3.1  --  3.3  --  3.3   GFR: Estimated Creatinine Clearance: 88.8 mL/min (by C-G formula based on SCr of 0.78 mg/dL). Liver Function Tests: Recent Labs  Lab 09/07/20 0348 09/09/20 2325 09/10/20 0329  AST 39 77* 64*  ALT 33 95* 89*  ALKPHOS 58 89 93  BILITOT 0.8 0.8 0.8  PROT 4.6* 4.9* 4.7*  ALBUMIN 1.8* 1.8* 1.8*   No results for input(s): LIPASE, AMYLASE in the last 168 hours. No results for input(s): AMMONIA in the last 168 hours. Coagulation Profile: Recent Labs  Lab 09/09/20 2325  INR 1.1   Cardiac Enzymes: No results for input(s): CKTOTAL, CKMB, CKMBINDEX, TROPONINI in the last 168 hours. BNP (last 3 results) No results for input(s): PROBNP in the last 8760 hours. HbA1C: No results for  input(s): HGBA1C in the last 72 hours. CBG: Recent Labs  Lab 09/12/20 1302 09/12/20 1653 09/12/20 1959 09/13/20 0117 09/13/20 0749  GLUCAP 139* 128* 118* 122* 127*   Lipid Profile: No results for input(s): CHOL, HDL, LDLCALC, TRIG, CHOLHDL, LDLDIRECT in the last 72 hours.  Thyroid Function Tests: No results for input(s): TSH, T4TOTAL, FREET4, T3FREE, THYROIDAB in the last 72 hours. Anemia Panel: No results for input(s): VITAMINB12, FOLATE, FERRITIN, TIBC, IRON, RETICCTPCT in the last 72 hours. Sepsis Labs: No results for input(s): PROCALCITON, LATICACIDVEN in the last 168 hours.  Recent Results (from the past 240 hour(s))  Aerobic/Anaerobic Culture w Gram Stain (surgical/deep wound)     Status: None (Preliminary result)   Collection Time: 09/10/20  1:16 PM   Specimen: Abdomen; Abdominal Fluid  Result Value Ref Range Status   Specimen Description ABDOMEN FLUID  Final   Special Requests ABDOMEN  Final   Gram Stain   Final    ABUNDANT WBC PRESENT, PREDOMINANTLY PMN NO ORGANISMS SEEN    Culture   Final    NO GROWTH 2 DAYS NO ANAEROBES ISOLATED; CULTURE IN PROGRESS FOR 5 DAYS Performed at [facility], [address] [place], [place] [address]    Report Status PENDING  Incomplete         Radiology Studies: No results found.      Scheduled Meds:  atenolol  25 mg Oral Daily   Chlorhexidine Gluconate Cloth  6 each Topical Daily   feeding supplement  1 Container Oral TID BM   lidocaine  1 patch Transdermal Q24H   methocarbamol  1,000 mg Oral TID   pantoprazole (PROTONIX) IV  40 mg Intravenous Q24H   sodium chloride flush  10-40 mL Intracatheter Q12H   Continuous Infusions:  sodium chloride 10 mL/hr at 09/02/20 0645   piperacillin-tazobactam (ZOSYN)  IV 3.375 g (09/13/20 0526)   TPN ADULT (ION) 50 mL/hr at 09/13/20 0506     LOS: 15 days    Time spent: 25 mins.More than 50% of that time was spent in counseling and/or coordination of  care.  Shelly Coss, MD [facility] P7/31/2022, 8:53 AM

## 2020-09-13 NOTE — TOC Progression Note (Signed)
Transition of Care [facility]) - Progression Note    Patient Details  Name: William Norman MRN: [id number] Date of Birth: Jun 25, 1940  Transition of Care [facility]) CM/SW [place], [place] Phone Number: 09/13/2020, 1:11 PM  Clinical Narrative:     CSW spoke with patient regarding SNF bed offers. Patient has chosen SNF placement at [facility]. Tressa Busman with [facility] confirmed they can accept patient for SNF placement when medically ready. CSW will need to have [place] start insurance authorization close to patient being medically ready. CSW will continue to follow and assist with dc planning needs.  Expected Discharge Plan: [place] Barriers to Discharge: Continued Medical Work up, Ship broker  Expected Discharge Plan and Services Expected Discharge Plan: [place] arrangements for the past 2 months: Single Family Home                                       Social Determinants of Health (SDOH) Interventions    Readmission Risk Interventions No flowsheet data found.

## 2020-09-14 LAB — CBC WITH DIFFERENTIAL/PLATELET
Abs Immature Granulocytes: 0.65 10*3/uL — ABNORMAL HIGH (ref 0.00–0.07)
Basophils Absolute: 0.1 10*3/uL (ref 0.0–0.1)
Basophils Relative: 1 %
Eosinophils Absolute: 0.2 10*3/uL (ref 0.0–0.5)
Eosinophils Relative: 1 %
HCT: 29.5 % — ABNORMAL LOW (ref 39.0–52.0)
Hemoglobin: 9.4 g/dL — ABNORMAL LOW (ref 13.0–17.0)
Immature Granulocytes: 5 %
Lymphocytes Relative: 13 %
Lymphs Abs: 1.7 10*3/uL (ref 0.7–4.0)
MCH: 30.1 pg (ref 26.0–34.0)
MCHC: 31.9 g/dL (ref 30.0–36.0)
MCV: 94.6 fL (ref 80.0–100.0)
Monocytes Absolute: 1.1 10*3/uL — ABNORMAL HIGH (ref 0.1–1.0)
Monocytes Relative: 9 %
Neutro Abs: 8.9 10*3/uL — ABNORMAL HIGH (ref 1.7–7.7)
Neutrophils Relative %: 71 %
Platelets: 384 10*3/uL (ref 150–400)
RBC: 3.12 MIL/uL — ABNORMAL LOW (ref 4.22–5.81)
RDW: 15.2 % (ref 11.5–15.5)
WBC: 12.5 10*3/uL — ABNORMAL HIGH (ref 4.0–10.5)
nRBC: 0 % (ref 0.0–0.2)

## 2020-09-14 LAB — GLUCOSE, CAPILLARY
Glucose-Capillary: 101 mg/dL — ABNORMAL HIGH (ref 70–99)
Glucose-Capillary: 107 mg/dL — ABNORMAL HIGH (ref 70–99)
Glucose-Capillary: 117 mg/dL — ABNORMAL HIGH (ref 70–99)
Glucose-Capillary: 119 mg/dL — ABNORMAL HIGH (ref 70–99)
Glucose-Capillary: 123 mg/dL — ABNORMAL HIGH (ref 70–99)
Glucose-Capillary: 135 mg/dL — ABNORMAL HIGH (ref 70–99)

## 2020-09-14 LAB — PREALBUMIN: Prealbumin: 12.7 mg/dL — ABNORMAL LOW (ref 18–38)

## 2020-09-14 LAB — COMPREHENSIVE METABOLIC PANEL
ALT: 145 U/L — ABNORMAL HIGH (ref 0–44)
AST: 107 U/L — ABNORMAL HIGH (ref 15–41)
Albumin: 1.8 g/dL — ABNORMAL LOW (ref 3.5–5.0)
Alkaline Phosphatase: 137 U/L — ABNORMAL HIGH (ref 38–126)
Anion gap: 6 (ref 5–15)
BUN: 23 mg/dL (ref 8–23)
CO2: 23 mmol/L (ref 22–32)
Calcium: 8.7 mg/dL — ABNORMAL LOW (ref 8.9–10.3)
Chloride: 106 mmol/L (ref 98–111)
Creatinine, Ser: 0.79 mg/dL (ref 0.61–1.24)
GFR, Estimated: >60 mL/min (ref >60)
Glucose, Bld: 116 mg/dL — ABNORMAL HIGH (ref 70–99)
Potassium: 4.4 mmol/L (ref 3.5–5.1)
Sodium: 135 mmol/L (ref 135–145)
Total Bilirubin: 1.1 mg/dL (ref 0.3–1.2)
Total Protein: 5.5 g/dL — ABNORMAL LOW (ref 6.5–8.1)

## 2020-09-14 LAB — PHOSPHORUS: Phosphorus: 3.3 mg/dL (ref 2.5–4.6)

## 2020-09-14 LAB — MAGNESIUM: Magnesium: 2.1 mg/dL (ref 1.7–2.4)

## 2020-09-14 LAB — TRIGLYCERIDES: Triglycerides: 54 mg/dL (ref <150)

## 2020-09-14 MED ORDER — ENSURE ENLIVE PO LIQD
237.0000 mL | Freq: Three times a day (TID) | ORAL | Status: DC
  Administered 2020-09-14 – 2020-09-15 (×4): 237 mL via ORAL

## 2020-09-14 MED ORDER — TRAVASOL 10 % IV SOLN
INTRAVENOUS | Status: AC
  Filled 2020-09-14: qty 1392

## 2020-09-14 MED ORDER — APIXABAN 5 MG PO TABS
5.0000 mg | ORAL_TABLET | Freq: Two times a day (BID) | ORAL | Status: DC
  Administered 2020-09-14 – 2020-09-18 (×9): 5 mg via ORAL
  Filled 2020-09-14 (×9): qty 1

## 2020-09-14 MED ORDER — ALTEPLASE 2 MG IJ SOLR
2.0000 mg | Freq: Once | INTRAMUSCULAR | Status: AC
  Administered 2020-09-14: 2 mg
  Filled 2020-09-14: qty 2

## 2020-09-14 NOTE — TOC Progression Note (Signed)
Transition of Care [facility]) - Progression Note    Patient Details  Name: William Norman MRN: [id number] Date of Birth: 11-06-1940  Transition of Care [facility]) CM/SW Contact  Emeterio Reeve, [place] Phone Number: 09/14/2020, 4:56 PM  Clinical Narrative:     CSW met with pt and wife at bedside. Pt and wife decided they did not want to go to [facility] and would prefer [facility] since it is closer to their home. CSW explained that [facility] does not accept pts insurance. CSW marked the facilities on medicare.gov that do accepts pts insurance. Wife became frustrated with their choices. Wife stated they need more time to talk about it. CSW assured wife she will follow up tomorrow.   Expected Discharge Plan: [place] Barriers to Discharge: Continued Medical Work up, Ship broker  Expected Discharge Plan and Services Expected Discharge Plan: [place] arrangements for the past 2 months: Single Family Home                                       Social Determinants of Health (SDOH) Interventions    Readmission Risk Interventions No flowsheet data found.  Emeterio Reeve, Latanya Presser, [place] Social Worker [phone]

## 2020-09-14 NOTE — Progress Notes (Signed)
PHARMACY - TOTAL PARENTERAL NUTRITION CONSULT NOTE  Indication: SBO  Patient Measurements: Height: 6' (182.9 cm) Weight: 96.5 kg (212 lb 11.9 oz) IBW/kg (Calculated) : 77.6 TPN AdjBW (KG): 82.8 Body mass index is 28.85 kg/m. Usual Weight: 97-100 kg  Assessment:  79 YOM presented on 7/15 with acute abdominal pain. CT showed inflamed loop of small bowel LEFT upper quadrant with potential closed loop anatomy. This could progress to high-grade obstruction. Initially treated with conservative management, but abdominal xray shows progressive SB distention consistent with SBO.  Pt denies recent weight loss.  PMH significant for GERD, bowel perforation in 2010, colostomy reversal in 2011 and SBO in 2016. CVA 02/2020, HTN, HLD.  Glucose / Insulin: no hx DM, A1c 5.9%. CBGs well controlled. SSI/CBG checks D/C'ed 7/30 Electrolytes: low Na, CoCa high normal at 10.46 (none in TPN), others WNL Renal: SCr <1, BUN 23 Hepatic: LFTs elevated and increased from 7/28 LFTs, tbili / TG WNL. Albumin 1.8. Prealbumin 12.7 (increased from 8.7 post-op) Intake / Output; MIVF: UOP decreased from 3450->1200 mL, drain output decreased to 50 mL. LBM 7/26 after suppository ID: Zosyn for possible infected abdominal hematoma (abx since 7/15) GI Imaging:  7/24 Abd x-ray: ordered  7/27 CT: probably pelvic hematoma GI Surgeries / Procedures:   7/20 ex-lap: SBO with closed loop obstruction in LUQ, SBR (~40 cm) with LOA 7/28 - pelvic aspiration (finding consistent with liquefied hematoma)  Central access: 09/02/20 TPN start date: 09/02/20  Nutritional Goals (RD rec 7/29): 2200-2400 kCal, 125-155g AA per day, fluid >/= 2.2 L  Current Nutrition:  TPN (s/p 7d thiamine through 7/28) Change to pureed diet 7/31 (no dentures) Boost TID - 3 charted given on 7/31, per discussion with patient 8/01, did not eat /drink any Boost drinks or food yesterday due to not feeling hungry yet. No nausea/vomiting reported.   Plan:   Discussed with Dr. Grandville Silos, will resume TPN at full rate but cycle at 18 hours to improve appetite (TPN @ 71-141 mL/hr) Electrolytes in TPN: Na 159mq/L, K 361m/L, Ca 24m61mL, Mg  4 mEq/L, Phos 12.5 mmol/L, Cl:Ac 1:1  (Electrolytes decreased by 50% to adjust for increased rate)  Continue standard MVI and trace elements to TPN Standard TPN labs daily F/U PO intake to wean off of TPN, Zosyn LOT/micro data   Thank you for allowing pharmacy to be a part of this patient's care.  AdrArdyth HarpsharmD Clinical Pharmacist

## 2020-09-14 NOTE — Progress Notes (Signed)
PROGRESS NOTE    William Norman  [id number] DOB: 01-13-1941 DOA: 08/28/2020 PCP: Vivi Barrack, MD   Chief Complain:Abdominal pain  Brief Narrative:  Patient is a 80 year old male with medical history significant for atrial fibrillation on Eliquis, coronary artery disease, history of CVA,, hypertension, perforated bowel in 2010 colostomy status post colostomy reversal in 2011 and small bowel obstruction 2016 that resolved with conservative management who presented with abdominal pain and was admitted on August 28, 2020.  Patient was found to have closed loop bowel obstruction.  General surgery consulted and he underwent expiratory laparotomy, extensive lysis evaluation, small bowel resection on 09/02/2020. Surgery is closely following.  We are waiting for bowel function.  CT abdomen/pelvis follow-up done on 09/09/20 showed large heterogeneous collection in the anterior pelvis consistent with hematoma.  IR consulted  and hematoma was aspirated. Now on dysphagia 3. Still no BM yet.  PT/OT recommended skilled nursing  facility on discharge  Assessment & Plan:   Principal Problem:   SBO (small bowel obstruction) (HCC) Active Problems:   Essential hypertension   Personal history of DVT (deep vein thrombosis)   Persistent atrial fibrillation (HCC)   CAD (coronary artery disease)   History of ischemic stroke   Recurrent UTI   Protein-calorie malnutrition, severe   1.Small bowel obstruction  Presented with abdominal pain Patient was found to have closed loop bowel obstruction.  General surgery consulted and he underwent expiratory laparotomy, extensive lysis evaluation, small bowel resection on 09/02/2020. Surgery is closely following. Continue TPN, but on half dose He was also on IV antibiotics, was discontinued after 5 days course postop. Currently has a wound VAC on the abdominal surgical wound. No BM yet.  He was passing flatus until yesterday. Has good bowel sounds.Abd not   distended. Started on dysphagia 3 diet.  2.  Hematoma  CT abdomen/pelvis follow-up done on 09/09/20 showed large( 18 x 6.8 cm) heterogeneous collection in the anterior pelvis consistent with hematoma.  IR consulted and he underwent aspiration of the hematoma. Hemoglobin had dropped from the range of 14-9 in last few days.  Continue to monitor H&H. He was restarted on  Zosyn for now, pending culture from aspiration/NGTD.Abx stopped.  Denies any abdominal pain today.  3.  Paroxysmal atrial fibrillation  rate controlled He was on heparin drip, discontinued after finding of hematoma. Continue atenolol on reduced dose Restarted Eliquis.Currently he is in NSR  4.  Urinary tract infection  Patient was started on ceftriaxone and he has had 3 doses  5.  History of CVA  / coronary artery disease On statin, beta-blocker and Eliquis . Eliquis on hold.  Continue atenolol  6.  Hypertension Monitor blood pressure.  Blood pressure soft but stable  7.  Leukocytosis WBC still up.  Patient is afebrile.  Continue Zosyn  8.  Debility/deconditioning Patient seen by PT/OT and recommended SNF  on discharge.  TOC consulted.  Patient is agreeable  Nutrition Problem: Severe Malnutrition Etiology: acute illness (SBO with no oral intake x 5 days)      DVT prophylaxis:SCD Code Status: Full Family Communication: Called and discussed with the wife on phone on 09/10/20 Status is: Inpatient  Remains inpatient appropriate because:Persistent severe electrolyte disturbances and IV treatments appropriate due to intensity of illness or inability to take PO  Dispo: The patient is from: Home              Anticipated d/c is to: SNF  Patient currently is not medically stable to d/c.   Difficult to place patient No No bowel movement yet  Consultants: Surgery  Procedures:As above  Antimicrobials:  Anti-infectives (From admission, onward)    Start     Dose/Rate Route Frequency Ordered Stop    09/10/20 0600  piperacillin-tazobactam (ZOSYN) IVPB 3.375 g        3.375 g 12.5 mL/hr over 240 Minutes Intravenous Every 8 hours 09/09/20 2204     09/09/20 2300  piperacillin-tazobactam (ZOSYN) IVPB 3.375 g        3.375 g 100 mL/hr over 30 Minutes Intravenous  Once 09/09/20 2204 09/09/20 2307   09/02/20 1845  piperacillin-tazobactam (ZOSYN) IVPB 3.375 g        3.375 g 12.5 mL/hr over 240 Minutes Intravenous Every 8 hours 09/02/20 1819 09/07/20 1359   09/02/20 1215  ceFAZolin (ANCEF) IVPB 2g/100 mL premix        2 g 200 mL/hr over 30 Minutes Intravenous  Once 09/02/20 1115 09/02/20 1320   08/29/20 0600  cefTRIAXone (ROCEPHIN) 1 g in sodium chloride 0.9 % 100 mL IVPB        1 g 200 mL/hr over 30 Minutes Intravenous Every 24 hours 08/29/20 0204 08/31/20 0730   08/28/20 2300  piperacillin-tazobactam (ZOSYN) IVPB 3.375 g        3.375 g 100 mL/hr over 30 Minutes Intravenous  Once 08/28/20 2245 08/28/20 2342       Subjective:  Patient seen and examined the bedside this morning.  Hemodynamically stable.  Overall comfortable.  Tolerating diet.  Denies any passage of gas today, no bowel movement  Objective: Vitals:   09/13/20 1350 09/13/20 1957 09/14/20 0437 09/14/20 0833  BP: 104/68 108/69 110/67 121/70  Pulse: 78 70 80 87  Resp: '18 16 16 18  '$ Temp: 97.7 F (36.5 C) 98.1 F (36.7 C) 98.4 F (36.9 C) 98.5 F (36.9 C)  TempSrc: Oral Oral Oral Oral  SpO2: 97% 97% 98% 95%  Weight:      Height:        Intake/Output Summary (Last 24 hours) at 09/14/2020 0847 Last data filed at 09/14/2020 [id number] Gross per 24 hour  Intake 1877.92 ml  Output 1675 ml  Net 202.92 ml   Filed Weights   08/29/20 0100 08/30/20 0501 09/01/20 0457  Weight: 97.1 kg 99.1 kg 96.5 kg    Examination:  General exam: Overall comfortable, not in distress HEENT: PERRL Respiratory system:  no wheezes or crackles  Cardiovascular system: S1 & S2 heard, RRR.  Gastrointestinal system: Abdomen is nondistended, soft and  nontender.midline surgical wound with wound VAC.BS present Central nervous system: Alert and oriented Extremities: No edema, no clubbing ,no cyanosis Skin: No rashes, no ulcers,no icterus     Data Reviewed: I have personally reviewed following labs and imaging studies  CBC: Recent Labs  Lab 09/09/20 2325 09/10/20 0329 09/11/20 0345 09/12/20 0327 09/13/20 0021  WBC 18.3* 17.8* 16.7* 15.9* 14.3*  NEUTROABS  --   --  11.3* 11.2* 10.3*  HGB 9.6* 9.3* 9.1* 9.0* 9.1*  HCT 30.6* 29.5* 28.4* 28.8* 28.4*  MCV 94.7 94.9 93.7 94.4 92.2  PLT 281 274 289 314 [id number]   Basic Metabolic Panel: Recent Labs  Lab 09/09/20 0030 09/09/20 2325 09/10/20 0329 09/11/20 0345 09/13/20 0021 09/14/20 0353  NA 136 132* 133* 132* 133* 135  K 4.3 4.5 4.5 4.4 4.4 4.4  CL 110 106 107 103 105 106  CO2 '23 22 22 23 23 23  '$ GLUCOSE  121* 113* 118* 115* 120* 116*  BUN 27* 28* 27* 26* 24* 23  CREATININE 0.69 0.69 0.72 0.81 0.78 0.79  CALCIUM 8.9 8.7* 8.7* 8.8* 8.6* 8.7*  MG 2.1  --  2.0  --  2.3 2.1  PHOS 3.1  --  3.3  --  3.3 3.3   GFR: Estimated Creatinine Clearance: 88.8 mL/min (by C-G formula based on SCr of 0.79 mg/dL). Liver Function Tests: Recent Labs  Lab 09/09/20 2325 09/10/20 0329 09/14/20 0353  AST 77* 64* 107*  ALT 95* 89* 145*  ALKPHOS 89 93 137*  BILITOT 0.8 0.8 1.1  PROT 4.9* 4.7* 5.5*  ALBUMIN 1.8* 1.8* 1.8*   No results for input(s): LIPASE, AMYLASE in the last 168 hours. No results for input(s): AMMONIA in the last 168 hours. Coagulation Profile: Recent Labs  Lab 09/09/20 2325  INR 1.1   Cardiac Enzymes: No results for input(s): CKTOTAL, CKMB, CKMBINDEX, TROPONINI in the last 168 hours. BNP (last 3 results) No results for input(s): PROBNP in the last 8760 hours. HbA1C: No results for input(s): HGBA1C in the last 72 hours. CBG: Recent Labs  Lab 09/13/20 1622 09/13/20 1959 09/14/20 0055 09/14/20 0431 09/14/20 0831  GLUCAP 136* 119* 101* 107* 123*   Lipid  Profile: Recent Labs    09/14/20 0353  TRIG 54    Thyroid Function Tests: No results for input(s): TSH, T4TOTAL, FREET4, T3FREE, THYROIDAB in the last 72 hours. Anemia Panel: No results for input(s): VITAMINB12, FOLATE, FERRITIN, TIBC, IRON, RETICCTPCT in the last 72 hours. Sepsis Labs: No results for input(s): PROCALCITON, LATICACIDVEN in the last 168 hours.  Recent Results (from the past 240 hour(s))  Aerobic/Anaerobic Culture w Gram Stain (surgical/deep wound)     Status: None (Preliminary result)   Collection Time: 09/10/20  1:16 PM   Specimen: Abdomen; Abdominal Fluid  Result Value Ref Range Status   Specimen Description ABDOMEN FLUID  Final   Special Requests ABDOMEN  Final   Gram Stain   Final    ABUNDANT WBC PRESENT, PREDOMINANTLY PMN NO ORGANISMS SEEN    Culture   Final    NO GROWTH 3 DAYS NO ANAEROBES ISOLATED; CULTURE IN PROGRESS FOR 5 DAYS Performed at [facility], [address], [place], [place] [address]    Report Status PENDING  Incomplete         Radiology Studies: No results found.      Scheduled Meds:  atenolol  25 mg Oral Daily   Chlorhexidine Gluconate Cloth  6 each Topical Daily   feeding supplement  1 Container Oral TID BM   lidocaine  1 patch Transdermal Q24H   methocarbamol  1,000 mg Oral TID   pantoprazole (PROTONIX) IV  40 mg Intravenous Q24H   sodium chloride flush  10-40 mL Intracatheter Q12H   Continuous Infusions:  sodium chloride 10 mL/hr at 09/02/20 0645   piperacillin-tazobactam (ZOSYN)  IV 12.5 mL/hr at 09/14/20 0728   TPN ADULT (ION) 50 mL/hr at 09/14/20 0728     LOS: 16 days    Time spent: 25 mins.More than 50% of that time was spent in counseling and/or coordination of care.      Shelly Coss, MD [facility] P8/02/2020, 8:47 AM

## 2020-09-14 NOTE — Progress Notes (Signed)
Nutrition Follow-up  DOCUMENTATION CODES:   Severe malnutrition in context of acute illness/injury  INTERVENTION:   Ensure Enlive po TID, each supplement provides 350 kcal and 20 grams of protein  Magic cup TID with meals, each supplement provides 290 kcal and 9 grams of protein   Upgrade diet to Dysphagia 3 diet (easy to chew)  Obtain new weight  NUTRITION DIAGNOSIS:   Severe Malnutrition related to acute illness (SBO with no oral intake x 5 days) as evidenced by moderate fat depletion, moderate muscle depletion, energy intake < or equal to 50% for > or equal to 5 days.  Being addressed via supplements, TPN  GOAL:   Patient will meet greater than or equal to 90% of their needs  Met  MONITOR:   I & O's, Diet advancement, Labs  REASON FOR ASSESSMENT:   Consult New TPN/TNA  ASSESSMENT:   80 yo male admitted with small bowel obstruction. S/P ex lap with LOA, small bowel resection, VAC placement on 7/20. PMH includes perforated diverticulitis, CAD, HLD, HTN, BPH, GERD, A fib, stroke.  7/24- NGT removed 7/28- CT guided aspiration of anterior abdomen/ pelvic fluid collections (90 ml aspirated); findings reveal post-operative hematoma; advanced to clear liquid diet  TPN being changed to cyclic but meeting 324% of estimated needs  Pt with poor appetite. Limited documentation of po intake, but recorded po intake 0-25% of meals. Pt reports he drank some Boost Breeze this AM but does not really care for it.   Pt is edentulous but has dentures at bedside and says he wears them.Pt does not like the puree food. Pt reports when wearing his dentures he has not problems chewing. Recommend upgrading to Dysphagia 3 (mechanical soft) diet and monitor tolerance  Pt agreeable to trying Ensure Enlive instead of Boost Breeze. Pt also agreeable to trying Magic Cup  Current wt 96.5 kg (from 7/19); need new weight  Labs: CBGs 101-135 Meds: reviewed   Diet Order:   Diet Order              DIET DYS 3 Room service appropriate? Yes; Fluid consistency: Thin  Diet effective now                   EDUCATION NEEDS:   Not appropriate for education at this time  Skin:  Skin Assessment: Skin Integrity Issues: Skin Integrity Issues:: Wound VAC Wound Vac: surgical incision to abdomen  Last BM:  7/30  Height:   Ht Readings from Last 1 Encounters:  08/28/20 6' (1.829 m)    Weight:   Wt Readings from Last 1 Encounters:  09/01/20 96.5 kg    Ideal Body Weight:  80.9 kg  BMI:  Body mass index is 28.85 kg/m.  Estimated Nutritional Needs:   Kcal:  2200-2400  Protein:  125-150 gm  Fluid:  >/= 2.2 L   Kerman Passey MS, RDN, LDN, CNSC Registered Dietitian III Clinical Nutrition RD Pager and On-Call Pager Number Located in [place]

## 2020-09-14 NOTE — Progress Notes (Signed)
Physical Therapy Treatment Patient Details Name: William Norman MRN: [id number] DOB: Jun 26, 1940 Today's Date: 09/14/2020    History of Present Illness 80 y.o. male presenting with abdominal pain 08/28/20. +small bowel obstruction, rate-controlled afib; 09/02/20 S/P exploratory laparotomy, extensive LOA, SBR x1    PMH significant for atrial fibrillation on Eliquis, CAD, history of CVA, hypertension, perforated bowel in 2010, colostomy reversal in 2011, and SBO in 2016 that resolved with conservative management, now    PT Comments    Seated in recliner sleeping soundly with increased time to rouse.  Pt required max encouragement to participate in PT session this pm.  He did however refused further ambulation trial.   Continue to recommend rehab in a post acute setting.   Follow Up Recommendations  SNF (after CIR denial prefers [facility])     Equipment Recommendations  None recommended by PT    Recommendations for Other Services       Precautions / Restrictions Precautions Precautions: Fall Precaution Comments: abdominal wound vac, monitor HR Restrictions Weight Bearing Restrictions: No    Mobility  Bed Mobility Overal bed mobility: Needs Assistance Bed Mobility: Sit to Supine       Sit to supine: Min assist   General bed mobility comments: Min assistance to guide LEs back to bed and assist with descent of trunk back to bed.    Transfers Overall transfer level: Needs assistance Equipment used: Rolling walker (2 wheeled) Transfers: Sit to/from Stand Sit to Stand: Min assist;From elevated surface         General transfer comment: Cues for sequencing and safety this session.  Pt attempted to pull on RW to rise into standing.  Cues to push from seated surface.  Performed x 2 standing trials with better technique on second attempt.  Ambulation/Gait Ambulation/Gait assistance: Min assist;+2 safety/equipment Gait Distance (Feet): 4 Feet (from recliner back to bed.   Pt refused further gt distance at this time.) Assistive device: Rolling walker (2 wheeled) Gait Pattern/deviations: Step-through pattern;Trunk flexed     General Gait Details: Cues for posture this session in standing,  Pt performed limited steps involving turn and backing to seated surface.   Stairs             Wheelchair Mobility    Modified Rankin (Stroke Patients Only)       Balance Overall balance assessment: Needs assistance   Sitting balance-Leahy Scale: Fair       Standing balance-Leahy Scale: Poor Standing balance comment: Heavy reliance on UE use and external assistance                            Cognition Arousal/Alertness: Awake/alert Behavior During Therapy: WFL for tasks assessed/performed Overall Cognitive Status: Within Functional Limits for tasks assessed                                        Exercises      General Comments        Pertinent Vitals/Pain Pain Assessment: Faces Faces Pain Scale: Hurts little more Pain Location: abdomen and L groin Pain Descriptors / Indicators: Guarding;Grimacing;Moaning;Operative site guarding Pain Intervention(s): Monitored during session;Repositioned    Home Living                      Prior Function  PT Goals (current goals can now be found in the care plan section) Acute Rehab PT Goals Patient Stated Goal: be able to go home Potential to Achieve Goals: Good Progress towards PT goals: Progressing toward goals    Frequency    Min 3X/week      PT Plan Current plan remains appropriate    Co-evaluation              AM-PAC PT "6 Clicks" Mobility   Outcome Measure  Help needed turning from your back to your side while in a flat bed without using bedrails?: A Little Help needed moving from lying on your back to sitting on the side of a flat bed without using bedrails?: A Little Help needed moving to and from a bed to a chair (including a  wheelchair)?: A Little Help needed standing up from a chair using your arms (e.g., wheelchair or bedside chair)?: A Little Help needed to walk in hospital room?: A Little Help needed climbing 3-5 steps with a railing? : A Little 6 Click Score: 18    End of Session Equipment Utilized During Treatment: Oxygen Activity Tolerance: Patient tolerated treatment well Patient left: in chair;with call bell/phone within reach;with chair alarm set Nurse Communication: Mobility status PT Visit Diagnosis: Difficulty in walking, not elsewhere classified (R26.2)     Time: [id number] PT Time Calculation (min) (ACUTE ONLY): 18 min  Charges:  $Therapeutic Activity: 8-22 mins                     Erasmo Leventhal , PTA Acute Rehabilitation Services Pager [phone] Office [phone]    Isobel Eisenhuth Eli Hose 09/14/2020, 3:50 PM

## 2020-09-14 NOTE — Progress Notes (Addendum)
Progress Note  12 Days Post-Op  Subjective: Denies abdominal pain, nausea, emesis. He had been tolerating small amount of soft/pureed food but states he ate very little yesterday due to low appetite. Last BM 2 days ago  Objective: Vital signs in last 24 hours: Temp:  [97.7 F (36.5 C)-98.5 F (36.9 C)] 98.5 F (36.9 C) (08/01 0833) Pulse Rate:  [70-87] 87 (08/01 0833) Resp:  [16-18] 18 (08/01 0833) BP: (104-121)/(67-70) 121/70 (08/01 0833) SpO2:  [95 %-98 %] 95 % (08/01 0833) Last BM Date: 09/12/20  Intake/Output from previous day: 07/31 0701 - 08/01 0700 In: 1818.2 [P.O.:440; I.V.:1227.5; IV Piggyback:150.7] Out: 1675 [Urine:1625; Drains:50] Intake/Output this shift: Total I/O In: 59.8 [I.V.:39.1; IV Piggyback:20.6] Out: -   PE: General: pleasant, WD, elderly male who is sitting up in NAD Heart: regular rate and rhythm Lungs: normal work of breathing.  Respiratory effort nonlabored on room air Abd: soft, nontender, nondistended; wound VAC in place Psych: A&Ox3 with an appropriate affect.    Lab Results:  Recent Labs    09/12/20 0327 09/13/20 0021  WBC 15.9* 14.3*  HGB 9.0* 9.1*  HCT 28.8* 28.4*  PLT 314 365    BMET Recent Labs    09/13/20 0021 09/14/20 0353  NA 133* 135  K 4.4 4.4  CL 105 106  CO2 23 23  GLUCOSE 120* 116*  BUN 24* 23  CREATININE 0.78 0.79  CALCIUM 8.6* 8.7*    PT/INR No results for input(s): LABPROT, INR in the last 72 hours.  CMP     Component Value Date/Time   NA 135 09/14/2020 0353   NA 141 12/03/2019 1509   K 4.4 09/14/2020 0353   CL 106 09/14/2020 0353   CO2 23 09/14/2020 0353   GLUCOSE 116 (H) 09/14/2020 0353   GLUCOSE 94 01/10/2006 1108   BUN 23 09/14/2020 0353   BUN 10 12/03/2019 1509   CREATININE 0.79 09/14/2020 0353   CREATININE 0.84 09/30/2019 1349   CALCIUM 8.7 (L) 09/14/2020 0353   PROT 5.5 (L) 09/14/2020 0353   PROT 6.5 12/03/2019 1509   ALBUMIN 1.8 (L) 09/14/2020 0353   ALBUMIN 4.2 12/03/2019 1509    AST 107 (H) 09/14/2020 0353   ALT 145 (H) 09/14/2020 0353   ALKPHOS 137 (H) 09/14/2020 0353   BILITOT 1.1 09/14/2020 0353   BILITOT 0.9 12/03/2019 1509   GFRNONAA >60 09/14/2020 0353   GFRAA 97 12/03/2019 1509   Lipase     Component Value Date/Time   LIPASE 15 08/28/2020 1931       Studies/Results: No results found.  Anti-infectives: Anti-infectives (From admission, onward)    Start     Dose/Rate Route Frequency Ordered Stop   09/10/20 0600  piperacillin-tazobactam (ZOSYN) IVPB 3.375 g        3.375 g 12.5 mL/hr over 240 Minutes Intravenous Every 8 hours 09/09/20 2204     09/09/20 2300  piperacillin-tazobactam (ZOSYN) IVPB 3.375 g        3.375 g 100 mL/hr over 30 Minutes Intravenous  Once 09/09/20 2204 09/09/20 2307   09/02/20 1845  piperacillin-tazobactam (ZOSYN) IVPB 3.375 g        3.375 g 12.5 mL/hr over 240 Minutes Intravenous Every 8 hours 09/02/20 1819 09/07/20 1359   09/02/20 1215  ceFAZolin (ANCEF) IVPB 2g/100 mL premix        2 g 200 mL/hr over 30 Minutes Intravenous  Once 09/02/20 1115 09/02/20 1320   08/29/20 0600  cefTRIAXone (ROCEPHIN) 1 g in sodium chloride  0.9 % 100 mL IVPB        1 g 200 mL/hr over 30 Minutes Intravenous Every 24 hours 08/29/20 0204 08/31/20 0730   08/28/20 2300  piperacillin-tazobactam (ZOSYN) IVPB 3.375 g        3.375 g 100 mL/hr over 30 Minutes Intravenous  Once 08/28/20 2245 08/28/20 2342        Assessment/Plan POD 12,  S/P exploratory laparotomy, extensive LOA, SBR x1 09/02/20 Dr. Georgette Dover for closed loop bowel obstruction - continue pureed diet - low intake due to low appetite - afebrile. WBC down to 14 yesterday; pelvic collection as noted below, started on zosyn. Recheck CBC tomorrow am - mobilize as tolerated, PT/OT - VAC m/w/f - will eval wound at time of exchange today Pelvic Collection - felt to be hematoma, s/p IR aspiration 7/28, abundant wbc but no organisms seen. NG x3d. Cxs in progress for 5d.   FEN - continue  pureed diet. IVF @ 50 cc/h, cycle tpn per pharmacy VTE - SCDs, heparin gtt  ID - Zosyn for 5 days post-op,  do not need to add any yet.  Await eval of fluid collection   A. Fib - ok to restart eliquis, d/c heparin gtt once this is done though please History of CVA January 2022 UTI - competed course of rocephin Hypertension Hyperlipidemia   LOS: 23 days   Winferd Humphrey, [facility] Surgery 09/14/2020, 10:01 AM Please see Amion for pager number during day hours 7:00am-4:30pm

## 2020-09-15 LAB — PHOSPHORUS: Phosphorus: 2.7 mg/dL (ref 2.5–4.6)

## 2020-09-15 LAB — AEROBIC/ANAEROBIC CULTURE W GRAM STAIN (SURGICAL/DEEP WOUND): Culture: NO GROWTH

## 2020-09-15 LAB — CBC
HCT: 29.2 % — ABNORMAL LOW (ref 39.0–52.0)
Hemoglobin: 9.1 g/dL — ABNORMAL LOW (ref 13.0–17.0)
MCH: 29.5 pg (ref 26.0–34.0)
MCHC: 31.2 g/dL (ref 30.0–36.0)
MCV: 94.8 fL (ref 80.0–100.0)
Platelets: 394 10*3/uL (ref 150–400)
RBC: 3.08 MIL/uL — ABNORMAL LOW (ref 4.22–5.81)
RDW: 15.1 % (ref 11.5–15.5)
WBC: 10.1 10*3/uL (ref 4.0–10.5)
nRBC: 0 % (ref 0.0–0.2)

## 2020-09-15 LAB — BASIC METABOLIC PANEL
Anion gap: 6 (ref 5–15)
BUN: 24 mg/dL — ABNORMAL HIGH (ref 8–23)
CO2: 25 mmol/L (ref 22–32)
Calcium: 8.6 mg/dL — ABNORMAL LOW (ref 8.9–10.3)
Chloride: 105 mmol/L (ref 98–111)
Creatinine, Ser: 0.71 mg/dL (ref 0.61–1.24)
GFR, Estimated: >60 mL/min (ref >60)
Glucose, Bld: 122 mg/dL — ABNORMAL HIGH (ref 70–99)
Potassium: 4.3 mmol/L (ref 3.5–5.1)
Sodium: 136 mmol/L (ref 135–145)

## 2020-09-15 LAB — MAGNESIUM: Magnesium: 2 mg/dL (ref 1.7–2.4)

## 2020-09-15 MED ORDER — TRAVASOL 10 % IV SOLN
INTRAVENOUS | Status: DC
  Filled 2020-09-15: qty 1392

## 2020-09-15 NOTE — Progress Notes (Signed)
Physical Therapy Treatment Patient Details Name: William Norman MRN: [id number] DOB: 02-07-1941 Today's Date: 09/15/2020    History of Present Illness 80 y.o. male presenting with abdominal pain 08/28/20. +small bowel obstruction, rate-controlled afib; 09/02/20 S/P exploratory laparotomy, extensive LOA, SBR x1    PMH significant for atrial fibrillation on Eliquis, CAD, history of CVA, hypertension, perforated bowel in 2010, colostomy reversal in 2011, and SBO in 2016 that resolved with conservative management, now    PT Comments     Pt seated in recliner and eager to ambulate this pm.  Pt making great progress this session.  Continue to recommend rehab in a post acute setting.     Follow Up Recommendations  SNF (after CIR denial prefers [facility].)     Equipment Recommendations  None recommended by PT    Recommendations for Other Services       Precautions / Restrictions Precautions Precautions: Fall Precaution Comments: monitor HR Restrictions Weight Bearing Restrictions: No    Mobility  Bed Mobility Overal bed mobility: Needs Assistance Bed Mobility: Sit to Supine Rolling: Supervision Sidelying to sit: Supervision       General bed mobility comments: Pt seated in recliner on arrival this session.    Transfers Overall transfer level: Needs assistance Equipment used: Rolling walker (2 wheeled) Transfers: Sit to/from Stand Sit to Stand: Min guard         General transfer comment: pt with good hand placement this session pt required min guard to rise into standing.  Ambulation/Gait Ambulation/Gait assistance: Min guard Gait Distance (Feet): 90 Feet Assistive device: Rolling walker (2 wheeled) Gait Pattern/deviations: Step-through pattern;Trunk flexed     General Gait Details: Cues for posture and RW safety this session.  Pt able to progress gt distance this session with decreased assistance.   Stairs             Wheelchair Mobility     Modified Rankin (Stroke Patients Only)       Balance Overall balance assessment: Needs assistance Sitting-balance support: Single extremity supported Sitting balance-Leahy Scale: Fair     Standing balance support: Bilateral upper extremity supported Standing balance-Leahy Scale: Poor                              Cognition Arousal/Alertness: Awake/alert Behavior During Therapy: WFL for tasks assessed/performed Overall Cognitive Status: Within Functional Limits for tasks assessed                                 General Comments: motivated to get OOB today, wants to have a BM      Exercises General Exercises - Lower Extremity Quad Sets: AROM;Both;10 reps;Supine Long Arc Quad: AROM;Both;10 reps;Seated Hip ABduction/ADduction: AROM;Both;Supine;10 reps Hip Flexion/Marching: AROM;Both;10 reps;Seated    General Comments General comments (skin integrity, edema, etc.): VSS throughout session, pt motivated for therapy today with hopes OOB mobility will assist in having a BM      Pertinent Vitals/Pain Pain Assessment: Faces Pain Score: 6  Faces Pain Scale: Hurts little more Pain Location: abdomen and L groin Pain Descriptors / Indicators: Tingling;Throbbing Pain Intervention(s): Monitored during session;Repositioned    Home Living                      Prior Function            PT Goals (current goals can now be found  in the care plan section) Acute Rehab PT Goals Patient Stated Goal: be able to go home Potential to Achieve Goals: Good Progress towards PT goals: Progressing toward goals    Frequency    Min 3X/week      PT Plan Current plan remains appropriate    Co-evaluation              AM-PAC PT "6 Clicks" Mobility   Outcome Measure  Help needed turning from your back to your side while in a flat bed without using bedrails?: A Little Help needed moving from lying on your back to sitting on the side of a flat bed  without using bedrails?: A Little Help needed moving to and from a bed to a chair (including a wheelchair)?: A Little Help needed standing up from a chair using your arms (e.g., wheelchair or bedside chair)?: A Little Help needed to walk in hospital room?: A Little Help needed climbing 3-5 steps with a railing? : A Little 6 Click Score: 18    End of Session Equipment Utilized During Treatment: Gait belt Activity Tolerance: Patient tolerated treatment well Patient left: in chair;with call bell/phone within reach;with chair alarm set Nurse Communication: Mobility status PT Visit Diagnosis: Difficulty in walking, not elsewhere classified (R26.2)     Time: [id number] PT Time Calculation (min) (ACUTE ONLY): 21 min  Charges:  $Gait Training: 8-22 mins                     Erasmo Leventhal , PTA Acute Rehabilitation Services Pager [phone] Office [phone]    Elenie Coven Eli Hose 09/15/2020, 4:35 PM

## 2020-09-15 NOTE — TOC Progression Note (Addendum)
Transition of Care [facility]) - Progression Note    Patient Details  Name: Farah Schirtzinger MRN: [id number] Date of Birth: [birth year]  Transition of [facility]) CM/SW Contact  Emeterio Reeve, [employer] Phone Number: 09/15/2020, 2:18 PM  Clinical Narrative:     CSW spoke to pts wife on phone. CSW informed wife that pt was accepted to [facility]. Wife inquired about [facility]. CSW called [facility] to review. CSW is waiting on call back.    4:00 Wife called back and inquired about [facility]. CSW called and is awaiting call back from admissions coordinator. [facility] denied pt for SNF.   Expected Discharge Plan: [place] Barriers to Discharge: Continued Medical Work up, Ship broker  Expected Discharge Plan and Services Expected Discharge Plan: [place] arrangements for the past 2 months: Single Family Home                                       Social Determinants of Health (SDOH) Interventions    Readmission Risk Interventions No flowsheet data found.  Emeterio Reeve, Latanya Presser, [place] Social Worker [phone]

## 2020-09-15 NOTE — Progress Notes (Addendum)
PHARMACY - TOTAL PARENTERAL NUTRITION CONSULT NOTE  Indication: SBO  Patient Measurements: Height: 6' (182.9 cm) Weight: 96 kg (211 lb 10.3 oz) IBW/kg (Calculated) : 77.6 TPN AdjBW (KG): 82.8 Body mass index is 28.7 kg/m. Usual Weight: 97-100 kg  Assessment:  79 YOM presented on 7/15 with acute abdominal pain. CT showed inflamed loop of small bowel LEFT upper quadrant with potential closed loop anatomy. This could progress to high-grade obstruction. Initially treated with conservative management, but abdominal xray shows progressive SB distention consistent with SBO.  Pt denies recent weight loss.  PMH significant for GERD, bowel perforation in 2010, colostomy reversal in 2011 and SBO in 2016. CVA 02/2020, HTN, HLD.  Glucose / Insulin: no hx DM, A1c 5.9%. CBGs well controlled. SSI/CBG checks D/C'ed 7/30 Electrolytes: Na improving (low end of normal range), CoCa high normal at 10.36 (none in TPN), Phos decr to 3.7, others WNL Renal: SCr <1, BUN 24 Hepatic: 8/01 LFTs elevated and increased from 7/28 LFTs, tbili / TG WNL. 8/01 Albumin 1.8. Prealbumin 12.7 (increased from 8.7 post-op) Intake / Output; MIVF: UOP 2275 mL, drain output decreased to 0 mL. LBM 7/26 after suppository ID: Zosyn for infected abdominal hematoma (7/15-8/01) GI Imaging:  7/24 Abd x-ray: ordered  7/27 CT: probably pelvic hematoma GI Surgeries / Procedures:   7/20 ex-lap: SBO with closed loop obstruction in LUQ, SBR (~40 cm) with LOA 7/28 - pelvic aspiration (finding consistent with liquefied hematoma)  Central access: 09/02/20 TPN start date: 09/02/20  Nutritional Goals (RD rec 8/01): 2200-2400 kCal, 125-155g AA per day, fluid >/= 2.2 L  Current Nutrition:  - TPN (s/p 7d thiamine through 7/28) - Change to pureed diet 7/31 (no dentures) - Discussed with 8/01 PM RN, who confirmed patient was able to eat several boost/ensures and bowl of chicken noodle soup  Plan:  -  Will stop TPN today per Surgery -  TPN  scheduled to stop at 1200  Thank you for allowing pharmacy to be a part of this patient's care.  Ardyth Harps, PharmD Clinical Pharmacist

## 2020-09-15 NOTE — Progress Notes (Addendum)
Progress Note  13 Days Post-Op  Subjective: Patient feels ok today.  Had some pain with  movement in his LLQ this morning that pain meds helped.  Starting to eat better per him and his wife yesterday afternoon.   1 fever yesterday, but given it's solidarity, question accuracy.  Objective: Vital signs in last 24 hours: Temp:  [97.6 F (36.4 C)-101.4 F (38.6 C)] 99 F (37.2 C) (08/02 0805) Pulse Rate:  [77-95] 82 (08/02 0805) Resp:  [17-19] 17 (08/02 0805) BP: (113-142)/(49-82) 130/73 (08/02 0805) SpO2:  [95 %-100 %] 96 % (08/02 0805) Weight:  [96 kg] 96 kg (08/01 1345) Last BM Date: 09/12/20  Intake/Output from previous day: 08/01 0701 - 08/02 0700 In: 2373.4 [P.O.:240; I.V.:2052.4; IV Piggyback:81] Out: 2650 [Urine:2650] Intake/Output this shift: No intake/output data recorded.  PE: Abd: soft, NT right now, pain in LLQ better, +BS, midline wound is clean with 100% beefy red granulation tissue   Lab Results:  Recent Labs    09/14/20 0353 09/15/20 0334  WBC 12.5* 10.1  HGB 9.4* 9.1*  HCT 29.5* 29.2*  PLT 384 394   BMET Recent Labs    09/14/20 0353 09/15/20 0334  NA 135 136  K 4.4 4.3  CL 106 105  CO2 23 25  GLUCOSE 116* 122*  BUN 23 24*  CREATININE 0.79 0.71  CALCIUM 8.7* 8.6*   PT/INR No results for input(s): LABPROT, INR in the last 72 hours.  CMP     Component Value Date/Time   NA 136 09/15/2020 0334   NA 141 12/03/2019 1509   K 4.3 09/15/2020 0334   CL 105 09/15/2020 0334   CO2 25 09/15/2020 0334   GLUCOSE 122 (H) 09/15/2020 0334   GLUCOSE 94 01/10/2006 1108   BUN 24 (H) 09/15/2020 0334   BUN 10 12/03/2019 1509   CREATININE 0.71 09/15/2020 0334   CREATININE 0.84 09/30/2019 1349   CALCIUM 8.6 (L) 09/15/2020 0334   PROT 5.5 (L) 09/14/2020 0353   PROT 6.5 12/03/2019 1509   ALBUMIN 1.8 (L) 09/14/2020 0353   ALBUMIN 4.2 12/03/2019 1509   AST 107 (H) 09/14/2020 0353   ALT 145 (H) 09/14/2020 0353   ALKPHOS 137 (H) 09/14/2020 0353    BILITOT 1.1 09/14/2020 0353   BILITOT 0.9 12/03/2019 1509   GFRNONAA >60 09/15/2020 0334   GFRAA 97 12/03/2019 1509   Lipase     Component Value Date/Time   LIPASE 15 08/28/2020 1931       Studies/Results: No results found.  Anti-infectives: Anti-infectives (From admission, onward)    Start     Dose/Rate Route Frequency Ordered Stop   09/10/20 0600  piperacillin-tazobactam (ZOSYN) IVPB 3.375 g  Status:  Discontinued        3.375 g 12.5 mL/hr over 240 Minutes Intravenous Every 8 hours 09/09/20 2204 09/14/20 1328   09/09/20 2300  piperacillin-tazobactam (ZOSYN) IVPB 3.375 g        3.375 g 100 mL/hr over 30 Minutes Intravenous  Once 09/09/20 2204 09/09/20 2307   09/02/20 1845  piperacillin-tazobactam (ZOSYN) IVPB 3.375 g        3.375 g 12.5 mL/hr over 240 Minutes Intravenous Every 8 hours 09/02/20 1819 09/07/20 1359   09/02/20 1215  ceFAZolin (ANCEF) IVPB 2g/100 mL premix        2 g 200 mL/hr over 30 Minutes Intravenous  Once 09/02/20 1115 09/02/20 1320   08/29/20 0600  cefTRIAXone (ROCEPHIN) 1 g in sodium chloride 0.9 % 100 mL IVPB  1 g 200 mL/hr over 30 Minutes Intravenous Every 24 hours 08/29/20 0204 08/31/20 0730   08/28/20 2300  piperacillin-tazobactam (ZOSYN) IVPB 3.375 g        3.375 g 100 mL/hr over 30 Minutes Intravenous  Once 08/28/20 2245 08/28/20 2342        Assessment/Plan POD 13,  S/P exploratory laparotomy, extensive LOA, SBR x1 09/02/20 Dr. Georgette Dover for closed loop bowel obstruction - continue heart healthy diet - afebrile. WBC normal - mobilize as tolerated, PT/OT - NS WD dressing changes to midline wound daily Pelvic Collection - felt to be hematoma, s/p IR aspiration 7/28, -NGTD -no abx therapy needed   FEN - HH, wean TNA off today VTE - SCDs, eliquis ID - Zosyn for 5 days post-op,   A. Fib - eliquis History of CVA January 2022 UTI - competed course of rocephin Hypertension Hyperlipidemia   LOS: 49 days   Henreitta Cea,  [facility] Surgery 09/15/2020, 9:18 AM Please see Amion for pager number during day hours 7:00am-4:30pm

## 2020-09-15 NOTE — Progress Notes (Signed)
PROGRESS NOTE    William Norman  [id number] DOB: Jul 20, 1940 DOA: 08/28/2020 PCP: Vivi Barrack, MD   Chief Complain:Abdominal pain  Brief Narrative:  Patient is a 80 year old male with medical history significant for atrial fibrillation on Eliquis, coronary artery disease, history of CVA,, hypertension, perforated bowel in 2010 colostomy status post colostomy reversal in 2011 and small bowel obstruction 2016 that resolved with conservative management who presented with abdominal pain and was admitted on August 28, 2020.  Patient was found to have closed loop bowel obstruction.  General surgery consulted and he underwent expiratory laparotomy, extensive lysis evaluation, small bowel resection on 09/02/2020. Surgery is closely following.  We are waiting for bowel function. return  CT abdomen/pelvis follow-up done on 09/09/20 showed large heterogeneous collection in the anterior pelvis consistent with hematoma.  IR consulted  and hematoma was aspirated. Now on solid diet. Still no BM yet.  PT/OT recommended skilled nursing  facility on discharge  Assessment & Plan:   Principal Problem:   SBO (small bowel obstruction) (HCC) Active Problems:   Essential hypertension   Personal history of DVT (deep vein thrombosis)   Persistent atrial fibrillation (HCC)   CAD (coronary artery disease)   History of ischemic stroke   Recurrent UTI   Protein-calorie malnutrition, severe   1.Small bowel obstruction  Presented with abdominal pain Patient was found to have closed loop bowel obstruction.  General surgery consulted and he underwent expiratory laparotomy, extensive lysis evaluation, small bowel resection on 09/02/2020. Surgery is closely following. Continue TPN, but on half dose He was also on IV antibiotics, was discontinued after 5 days course postop. Currently has a wound VAC on the abdominal surgical wound. No BM yet.  He is passing flatus . Has good bowel sounds.Abd not   distended. Started on solid diet Wound VAC has been removed  2.  Hematoma  CT abdomen/pelvis follow-up done on 09/09/20 showed large( 18 x 6.8 cm) heterogeneous collection in the anterior pelvis consistent with hematoma.  IR consulted and he underwent aspiration of the hematoma. Hemoglobin has remained stable He was restarted on  Zosyn again, cx from  aspiration has NGTD.Abx stopped.  Denies any abdominal pain today.  3.  Paroxysmal atrial fibrillation  rate controlled He was on heparin drip, discontinued after finding of hematoma. Continue atenolol on reduced dose Restarted Eliquis.Currently he is in NSR  4.  Urinary tract infection  Patient was started on ceftriaxone and he has had 3 doses  5.  History of CVA  / coronary artery disease On statin, beta-blocker and Eliquis .   Continue atenolol  6.  Hypertension Monitor blood pressure.  Blood pressure soft but stable  7.  Leukocytosis resolved  8.  Debility/deconditioning Patient seen by PT/OT and recommended SNF  on discharge.  TOC consulted.  Patient is agreeable  Nutrition Problem: Severe Malnutrition Etiology: acute illness (SBO with no oral intake x 5 days)      DVT prophylaxis:SCD Code Status: Full Family Communication: Called and discussed with the wife on phone on 09/10/20 Status is: Inpatient  Remains inpatient appropriate because:Persistent severe electrolyte disturbances and IV treatments appropriate due to intensity of illness or inability to take PO  Dispo: The patient is from: Home              Anticipated d/c is to: SNF               Patient currently is not medically stable to d/c.   Difficult to place patient  No No bowel movement yet  Consultants: Surgery  Procedures:As above  Antimicrobials:  Anti-infectives (From admission, onward)    Start     Dose/Rate Route Frequency Ordered Stop   09/10/20 0600  piperacillin-tazobactam (ZOSYN) IVPB 3.375 g  Status:  Discontinued        3.375 g 12.5 mL/hr  over 240 Minutes Intravenous Every 8 hours 09/09/20 2204 09/14/20 1328   09/09/20 2300  piperacillin-tazobactam (ZOSYN) IVPB 3.375 g        3.375 g 100 mL/hr over 30 Minutes Intravenous  Once 09/09/20 2204 09/09/20 2307   09/02/20 1845  piperacillin-tazobactam (ZOSYN) IVPB 3.375 g        3.375 g 12.5 mL/hr over 240 Minutes Intravenous Every 8 hours 09/02/20 1819 09/07/20 1359   09/02/20 1215  ceFAZolin (ANCEF) IVPB 2g/100 mL premix        2 g 200 mL/hr over 30 Minutes Intravenous  Once 09/02/20 1115 09/02/20 1320   08/29/20 0600  cefTRIAXone (ROCEPHIN) 1 g in sodium chloride 0.9 % 100 mL IVPB        1 g 200 mL/hr over 30 Minutes Intravenous Every 24 hours 08/29/20 0204 08/31/20 0730   08/28/20 2300  piperacillin-tazobactam (ZOSYN) IVPB 3.375 g        3.375 g 100 mL/hr over 30 Minutes Intravenous  Once 08/28/20 2245 08/28/20 2342       Subjective:  Patient seen and examined the bedside this morning.  Hemodynamically  stable.  Comfortable.  Denies any new complaints today.  Passing gas but no bowel movement yet.  Denies any abdominal pain.  Tolerating solid food  Objective: Vitals:   09/15/20 0024 09/15/20 0515 09/15/20 0805 09/15/20 1130  BP: 123/67 (!) 142/70 130/73 (!) 110/97  Pulse: 95 86 82 86  Resp:   17 18  Temp: 98.4 F (36.9 C) 98.7 F (37.1 C) 99 F (37.2 C) 98.4 F (36.9 C)  TempSrc: Oral Oral Oral Oral  SpO2: 97% 97% 96% 95%  Weight:      Height:        Intake/Output Summary (Last 24 hours) at 09/15/2020 1134 Last data filed at 09/15/2020 0518 Gross per 24 hour  Intake 2313.61 ml  Output 2150 ml  Net 163.61 ml   Filed Weights   08/30/20 0501 09/01/20 0457 09/14/20 1345  Weight: 99.1 kg 96.5 kg 96 kg    Examination:  General exam: Overall comfortable, not in distress, pleasant elderly male HEENT: PERRL Respiratory system:  no wheezes or crackles  Cardiovascular system: S1 & S2 heard, RRR.  Gastrointestinal system: Abdomen is nondistended, soft and  nontender.midline surgical wound ,bowel sounds present Central nervous system: Alert and oriented Extremities: No edema, no clubbing ,no cyanosis Skin: No rashes, no ulcers,no icterus     Data Reviewed: I have personally reviewed following labs and imaging studies  CBC: Recent Labs  Lab 09/11/20 0345 09/12/20 0327 09/13/20 0021 09/14/20 0353 09/15/20 0334  WBC 16.7* 15.9* 14.3* 12.5* 10.1  NEUTROABS 11.3* 11.2* 10.3* 8.9*  --   HGB 9.1* 9.0* 9.1* 9.4* 9.1*  HCT 28.4* 28.8* 28.4* 29.5* 29.2*  MCV 93.7 94.4 92.2 94.6 94.8  PLT 289 314 365 384 [id number]   Basic Metabolic Panel: Recent Labs  Lab 09/09/20 0030 09/09/20 2325 09/10/20 0329 09/11/20 0345 09/13/20 0021 09/14/20 0353 09/15/20 0334  NA 136   < > 133* 132* 133* 135 136  K 4.3   < > 4.5 4.4 4.4 4.4 4.3  CL 110   < >  107 103 105 106 105  CO2 23   < > '22 23 23 23 25  '$ GLUCOSE 121*   < > 118* 115* 120* 116* 122*  BUN 27*   < > 27* 26* 24* 23 24*  CREATININE 0.69   < > 0.72 0.81 0.78 0.79 0.71  CALCIUM 8.9   < > 8.7* 8.8* 8.6* 8.7* 8.6*  MG 2.1  --  2.0  --  2.3 2.1 2.0  PHOS 3.1  --  3.3  --  3.3 3.3 2.7   < > = values in this interval not displayed.   GFR: Estimated Creatinine Clearance: 88.5 mL/min (by C-G formula based on SCr of 0.71 mg/dL). Liver Function Tests: Recent Labs  Lab 09/09/20 2325 09/10/20 0329 09/14/20 0353  AST 77* 64* 107*  ALT 95* 89* 145*  ALKPHOS 89 93 137*  BILITOT 0.8 0.8 1.1  PROT 4.9* 4.7* 5.5*  ALBUMIN 1.8* 1.8* 1.8*   No results for input(s): LIPASE, AMYLASE in the last 168 hours. No results for input(s): AMMONIA in the last 168 hours. Coagulation Profile: Recent Labs  Lab 09/09/20 2325  INR 1.1   Cardiac Enzymes: No results for input(s): CKTOTAL, CKMB, CKMBINDEX, TROPONINI in the last 168 hours. BNP (last 3 results) No results for input(s): PROBNP in the last 8760 hours. HbA1C: No results for input(s): HGBA1C in the last 72 hours. CBG: Recent Labs  Lab 09/14/20 0431  09/14/20 0831 09/14/20 1144 09/14/20 1558 09/14/20 2026  GLUCAP 107* 123* 135* 117* 119*   Lipid Profile: Recent Labs    09/14/20 0353  TRIG 54    Thyroid Function Tests: No results for input(s): TSH, T4TOTAL, FREET4, T3FREE, THYROIDAB in the last 72 hours. Anemia Panel: No results for input(s): VITAMINB12, FOLATE, FERRITIN, TIBC, IRON, RETICCTPCT in the last 72 hours. Sepsis Labs: No results for input(s): PROCALCITON, LATICACIDVEN in the last 168 hours.  Recent Results (from the past 240 hour(s))  Aerobic/Anaerobic Culture w Gram Stain (surgical/deep wound)     Status: None (Preliminary result)   Collection Time: 09/10/20  1:16 PM   Specimen: Abdomen; Abdominal Fluid  Result Value Ref Range Status   Specimen Description ABDOMEN FLUID  Final   Special Requests ABDOMEN  Final   Gram Stain   Final    ABUNDANT WBC PRESENT, PREDOMINANTLY PMN NO ORGANISMS SEEN    Culture   Final    NO GROWTH 4 DAYS NO ANAEROBES ISOLATED; CULTURE IN PROGRESS FOR 5 DAYS Performed at [facility], [address], [place], [place] [address]    Report Status PENDING  Incomplete         Radiology Studies: No results found.      Scheduled Meds:  apixaban  5 mg Oral BID   atenolol  25 mg Oral Daily   Chlorhexidine Gluconate Cloth  6 each Topical Daily   feeding supplement  237 mL Oral TID BM   lidocaine  1 patch Transdermal Q24H   methocarbamol  1,000 mg Oral TID   pantoprazole (PROTONIX) IV  40 mg Intravenous Q24H   sodium chloride flush  10-40 mL Intracatheter Q12H   Continuous Infusions:  sodium chloride 10 mL/hr at [id number]   TPN CYCLIC-ADULT (ION) 71 mL/hr at 09/15/20 1133     LOS: 17 days    Time spent: 25 mins.More than 50% of that time was spent in counseling and/or coordination of care.      Shelly Coss, MD [facility] P8/03/2020, 11:34 AM

## 2020-09-15 NOTE — Progress Notes (Signed)
Occupational Therapy Treatment Patient Details Name: William Norman MRN: [id number] DOB: 1940/12/29 Today's Date: 09/15/2020    History of present illness 80 y.o. male presenting with abdominal pain 08/28/20. +small bowel obstruction, rate-controlled afib; 09/02/20 S/P exploratory laparotomy, extensive LOA, SBR x1    PMH significant for atrial fibrillation on Eliquis, CAD, history of CVA, hypertension, perforated bowel in 2010, colostomy reversal in 2011, and SBO in 2016 that resolved with conservative management, now   OT comments  William Norman is progressing well with increased motivation today. He completed bed mobility with supervision A given increased time and the HOB slightly elevated. Pt required minA to boost into standing, and for balance while ambulating 62f in the room. Pt requested to attempt ambulation without AD this session, after short distance to the chair, he agreed that he is more stable with use of a RW. Pt is frustrated with how fatigued and weak he has gotten. Pt continued to benefit from OT acutely to progress function in Adls and mobility. D/c plan updated to SNF   Follow Up Recommendations  SNF;Supervision - Intermittent    Equipment Recommendations  None recommended by OT       Precautions / Restrictions Precautions Precautions: Fall Precaution Comments: monitor HR Restrictions Weight Bearing Restrictions: No       Mobility Bed Mobility Overal bed mobility: Needs Assistance Bed Mobility: Sit to Supine Rolling: Supervision Sidelying to sit: Supervision       General bed mobility comments: +incrased time and vc for log rolling and pillow splinting. Pt declined pillow splinting. once sitting EOB he was slightly dizzy, it resolved quickly wtih rest    Transfers Overall transfer level: Needs assistance Equipment used: Rolling walker (2 wheeled) Transfers: Sit to/from Stand Sit to Stand: Min assist;From elevated surface         General transfer  comment: pt wtih good hand placement this session, light minA to boost into standing    Balance Overall balance assessment: Needs assistance Sitting-balance support: Single extremity supported Sitting balance-Leahy Scale: Fair     Standing balance support: Bilateral upper extremity supported Standing balance-Leahy Scale: Poor                             ADL either performed or assessed with clinical judgement   ADL                           Toilet Transfer: Minimal assistance;Cueing for safety;Ambulation;RW;BSC Toilet Transfer Details (indicate cue type and reason): simulated from bed>chair with short room distance ambulation. Pt required minA for balance in standing during weight shifting         Functional mobility during ADLs: Minimal assistance;Rolling walker General ADL Comments: pt with poor activity tolerance, labored breathing after abotu 548fof ambulation adn required significant rest break     Vision   Vision Assessment?: No apparent visual deficits          Cognition Arousal/Alertness: Awake/alert Behavior During Therapy: WFL for tasks assessed/performed Overall Cognitive Status: Within Functional Limits for tasks assessed                                 General Comments: motivated to get OOB today, wants to have a BM              General Comments VSS throughout session, pt motivated  for therapy today with hopes OOB mobility will assist in having a BM    Pertinent Vitals/ Pain       Pain Assessment: Faces Faces Pain Scale: Hurts little more Pain Location: abdomen and L groin Pain Descriptors / Indicators: Tingling;Throbbing Pain Intervention(s): Monitored during session   Frequency  Min 2X/week        Progress Toward Goals  OT Goals(current goals can now be found in the care plan section)  Progress towards OT goals: Progressing toward goals  Acute Rehab OT Goals Patient Stated Goal: be able to go  home OT Goal Formulation: With patient Time For Goal Achievement: 09/18/20 Potential to Achieve Goals: Good ADL Goals Pt Will Perform Grooming: Independently;standing Pt Will Perform Lower Body Bathing: with modified independence;with adaptive equipment;sit to/from stand Pt Will Perform Lower Body Dressing: with modified independence;with adaptive equipment;sit to/from stand Pt Will Transfer to Toilet: with modified independence;ambulating;bedside commode Pt Will Perform Toileting - Clothing Manipulation and hygiene: with modified independence;sit to/from stand Additional ADL Goal #1: Pt will be Mod I in and OOB for basic ADLs (HOB down, no rail, increased time)  Plan Discharge plan needs to be updated;Frequency remains appropriate    Co-evaluation                 AM-PAC OT "6 Clicks" Daily Activity     Outcome Measure   Help from another person eating meals?: None Help from another person taking care of personal grooming?: A Little Help from another person toileting, which includes using toliet, bedpan, or urinal?: A Little Help from another person bathing (including washing, rinsing, drying)?: A Lot Help from another person to put on and taking off regular upper body clothing?: A Little Help from another person to put on and taking off regular lower body clothing?: A Lot 6 Click Score: 17    End of Session Equipment Utilized During Treatment: Gait belt;Rolling walker  OT Visit Diagnosis: Unsteadiness on feet (R26.81);Muscle weakness (generalized) (M62.81);Pain   Activity Tolerance Patient tolerated treatment well;Patient limited by fatigue   Patient Left in chair;with call bell/phone within reach;with chair alarm set   Nurse Communication Mobility status        Time: 1313-1330 OT Time Calculation (min): 17 min  Charges: OT General Charges $OT Visit: 1 Visit OT Treatments $Therapeutic Activity: 8-22 mins    Amyia Lodwick A Amarissa Koerner 09/15/2020, 1:46 PM

## 2020-09-16 NOTE — TOC Progression Note (Addendum)
Transition of Care [facility]) - Progression Note    Patient Details  Name: William Norman MRN: [id number] Date of Birth: 07/23/1940  Transition of [facility]) CM/SW Contact  Emeterio Reeve, [place] Phone Number: 09/16/2020, 3:23 PM  Clinical Narrative:     CSW called pts wife to get bed offers. Wife continues to have difficulties with picking a facility. Wife named other facilities in the area. CSW explained multiple times that there are only a limited number of facilities that accept pts insurance. Pt has 3 bed offers. CSW enlisted help from MD to encourage wife to make decision.   5:00pm- CSW received call from pts wife, they have chosen [facility]. Admissions coordinator is aware and will start insurance auth.   Expected Discharge Plan: [place] Barriers to Discharge: Continued Medical Work up, Ship broker  Expected Discharge Plan and Services Expected Discharge Plan: [place] arrangements for the past 2 months: Single Family Home                                       Social Determinants of Health (SDOH) Interventions    Readmission Risk Interventions No flowsheet data found.  Emeterio Reeve, Latanya Presser, [place] Social Worker [phone]

## 2020-09-16 NOTE — Progress Notes (Signed)
Pt unable to urinate. Bladder scan only showing 46m. Foley was discontinued at 1500. Made MD aware. In and out x1 was ordered and executed. 8068mof yellow urine was obtained.

## 2020-09-16 NOTE — Progress Notes (Signed)
Pt still c/o of inability to void. Scanned with 50cc noted on bladder scan. Pt voicing need for having foley reinserted. Discussed with MD and order given to reinsert foley. Foley placed with minimal discomfort

## 2020-09-16 NOTE — Progress Notes (Signed)
PROGRESS NOTE    William Norman  [id number] DOB: 10-25-1940 DOA: 08/28/2020 PCP: Vivi Barrack, MD   Chief Complain:Abdominal pain  Brief Narrative:  79 home dwell WM atrial fibrillation [place] 2>4 on Eliquis, coronary artery disease, history of CVA,, hypertension, perforated bowel in 2010 colostomy status post colostomy reversal in 2011 and small bowel obstruction 2016 that resolved with conservative management who  presented with abdominal pain on August 28, 2020--closed loop bowel obstruction.   General surgery consulted and he underwent expiratory laparotomy, extensive lysis evaluation, small bowel resection on 09/02/2020.  CT abdomen/pelvis follow-up done on 09/09/20 showed large heterogeneous collection in the anterior pelvis consistent with hematoma.  IR consulted  and hematoma was aspirated, AC stopped   Now on solid diet. Still no BM yet.  PT/OT recommended skilled nursing  facility on discharge  Assessment & Plan:   Principal Problem:   SBO (small bowel obstruction) (HCC) Active Problems:   Essential hypertension   Personal history of DVT (deep vein thrombosis)   Persistent atrial fibrillation (HCC)   CAD (coronary artery disease)   History of ischemic stroke   Recurrent UTI   Protein-calorie malnutrition, severe   1.Small bowel obstruction  Presented with abdominal pain Patient was found to have closed loop bowel obstruction.  General surgery consulted and he underwent expiratory laparotomy, extensive lysis evaluation, small bowel resection on 09/02/2020.  No further TPN--full diet WOC care per surgery--Pain controlled Wound VAC has been removed D/c to SNF soon CALLED AND LEFT DETAILED VM ON BOTH HOME AND CELL PHONE OF RELATIVE SUE Glennie INFORMING HER OF IMMINENT D/C TOSNF OF HER CHOOSING IN AM AND ENCOURAGED HER TO CB CSW MIRANDA  2.  Hematoma  CT abdomen/pelvis follow-up done on 09/09/20 showed large( 18 x 6.8 cm) heterogeneous collection in the  anterior pelvis consistent with hematoma.  IR consulted and he underwent aspiration of the hematoma. Hemoglobin has remained stable restarted on  Zosyn again, cx from  aspiration has NGTD.Abx stopped.  Denies any abdominal pain today.  3.  Paroxysmal atrial fibrillation rate controlled He was on heparin drip, discontinued after finding of hematoma. Continue atenolol on reduced dose Restarted Eliquis.Currently he is in NSR  4.  Urinary tract infection  Patient was started on ceftriaxone and he has had 3 doses  5.  History of CVA  / coronary artery disease On statin, beta-blocker and Eliquis .   Continue atenolol  6.  Hypertension Monitor blood pressure.  Blood pressure soft but stable  7.  Leukocytosis resolved  8.  Debility/deconditioning Patient seen by PT/OT and recommended SNF  on discharge.  TOC consulted.  Patient is agreeable  Nutrition Problem: Severe Malnutrition Etiology: acute illness (SBO with no oral intake x 5 days)      DVT prophylaxis:SCD Code Status: Full Family Communication: as above Status is: Inpatient  Remains inpatient appropriate because:Persistent severe electrolyte disturbances and IV treatments appropriate due to intensity of illness or inability to take PO  Dispo: The patient is from: Home              Anticipated d/c is to: SNF               Patient currently is not medically stable to d/c.   Difficult to place patient No No bowel movement yet  Consultants: Surgery  Procedures:As above  Antimicrobials:  Anti-infectives (From admission, onward)    Start     Dose/Rate Route Frequency Ordered Stop   09/10/20 0600  piperacillin-tazobactam (  ZOSYN) IVPB 3.375 g  Status:  Discontinued        3.375 g 12.5 mL/hr over 240 Minutes Intravenous Every 8 hours 09/09/20 2204 09/14/20 1328   09/09/20 2300  piperacillin-tazobactam (ZOSYN) IVPB 3.375 g        3.375 g 100 mL/hr over 30 Minutes Intravenous  Once 09/09/20 2204 09/09/20 2307   09/02/20  1845  piperacillin-tazobactam (ZOSYN) IVPB 3.375 g        3.375 g 12.5 mL/hr over 240 Minutes Intravenous Every 8 hours 09/02/20 1819 09/07/20 1359   09/02/20 1215  ceFAZolin (ANCEF) IVPB 2g/100 mL premix        2 g 200 mL/hr over 30 Minutes Intravenous  Once 09/02/20 1115 09/02/20 1320   08/29/20 0600  cefTRIAXone (ROCEPHIN) 1 g in sodium chloride 0.9 % 100 mL IVPB        1 g 200 mL/hr over 30 Minutes Intravenous Every 24 hours 08/29/20 0204 08/31/20 0730   08/28/20 2300  piperacillin-tazobactam (ZOSYN) IVPB 3.375 g        3.375 g 100 mL/hr over 30 Minutes Intravenous  Once 08/28/20 2245 08/28/20 2342       Subjective:  Well eating grilled cheese, soup Mobile no cp fever cough cold  Objective: Vitals:   09/15/20 1648 09/15/20 1939 09/16/20 0436 09/16/20 1419  BP: 116/67 140/78 127/86 112/71  Pulse: 75 82 90 96  Resp: '18 20 20 20  '$ Temp: 98.9 F (37.2 C) 98.3 F (36.8 C) 99.1 F (37.3 C) 98 F (36.7 C)  TempSrc: Oral Oral Oral Oral  SpO2: 97% 90% 95% 93%  Weight:      Height:        Intake/Output Summary (Last 24 hours) at 09/16/2020 1637 Last data filed at 09/16/2020 0138 Gross per 24 hour  Intake 240 ml  Output 800 ml  Net -560 ml    Filed Weights   08/30/20 0501 09/01/20 0457 09/14/20 1345  Weight: 99.1 kg 96.5 kg 96 kg    Examination:  Awake coherent no distress mildly cachectic Chest clear no added sound no rales no rhonchi S1-S2 no murmur no rub no gallop Abdomen soft no rebound no guarding bandage superimposed on midline ROM intact moving all 4 limbs equally no focal deficit Psych euthymic coherent pleasant   Data Reviewed: I have personally reviewed following labs and imaging studies  CBC: Recent Labs  Lab 09/11/20 0345 09/12/20 0327 09/13/20 0021 09/14/20 0353 09/15/20 0334  WBC 16.7* 15.9* 14.3* 12.5* 10.1  NEUTROABS 11.3* 11.2* 10.3* 8.9*  --   HGB 9.1* 9.0* 9.1* 9.4* 9.1*  HCT 28.4* 28.8* 28.4* 29.5* 29.2*  MCV 93.7 94.4 92.2 94.6 94.8   PLT 289 314 365 384 [id number]    Basic Metabolic Panel: Recent Labs  Lab 09/10/20 0329 09/11/20 0345 09/13/20 0021 09/14/20 0353 09/15/20 0334  NA 133* 132* 133* 135 136  K 4.5 4.4 4.4 4.4 4.3  CL 107 103 105 106 105  CO2 '22 23 23 23 25  '$ GLUCOSE 118* 115* 120* 116* 122*  BUN 27* 26* 24* 23 24*  CREATININE 0.72 0.81 0.78 0.79 0.71  CALCIUM 8.7* 8.8* 8.6* 8.7* 8.6*  MG 2.0  --  2.3 2.1 2.0  PHOS 3.3  --  3.3 3.3 2.7    GFR: Estimated Creatinine Clearance: 88.5 mL/min (by C-G formula based on SCr of 0.71 mg/dL). Liver Function Tests: Recent Labs  Lab 09/09/20 2325 09/10/20 0329 09/14/20 0353  AST 77* 64* 107*  ALT 95*  89* 145*  ALKPHOS 89 93 137*  BILITOT 0.8 0.8 1.1  PROT 4.9* 4.7* 5.5*  ALBUMIN 1.8* 1.8* 1.8*    No results for input(s): LIPASE, AMYLASE in the last 168 hours. No results for input(s): AMMONIA in the last 168 hours. Coagulation Profile: Recent Labs  Lab 09/09/20 2325  INR 1.1    Cardiac Enzymes: No results for input(s): CKTOTAL, CKMB, CKMBINDEX, TROPONINI in the last 168 hours. BNP (last 3 results) No results for input(s): PROBNP in the last 8760 hours. HbA1C: No results for input(s): HGBA1C in the last 72 hours. CBG: Recent Labs  Lab 09/14/20 0431 09/14/20 0831 09/14/20 1144 09/14/20 1558 09/14/20 2026  GLUCAP 107* 123* 135* 117* 119*    Lipid Profile: Recent Labs    09/14/20 0353  TRIG 54     Thyroid Function Tests: No results for input(s): TSH, T4TOTAL, FREET4, T3FREE, THYROIDAB in the last 72 hours. Anemia Panel: No results for input(s): VITAMINB12, FOLATE, FERRITIN, TIBC, IRON, RETICCTPCT in the last 72 hours. Sepsis Labs: No results for input(s): PROCALCITON, LATICACIDVEN in the last 168 hours.  Recent Results (from the past 240 hour(s))  Aerobic/Anaerobic Culture w Gram Stain (surgical/deep wound)     Status: None   Collection Time: 09/10/20  1:16 PM   Specimen: Abdomen; Abdominal Fluid  Result Value Ref Range Status    Specimen Description ABDOMEN FLUID  Final   Special Requests ABDOMEN  Final   Gram Stain   Final    ABUNDANT WBC PRESENT, PREDOMINANTLY PMN NO ORGANISMS SEEN    Culture   Final    No growth aerobically or anaerobically. Performed at [facility], [place] [address], [place], [place] [address]    Report Status 09/15/2020 FINAL  Final          Radiology Studies: No results found.      Scheduled Meds:  apixaban  5 mg Oral BID   atenolol  25 mg Oral Daily   Chlorhexidine Gluconate Cloth  6 each Topical Daily   feeding supplement  237 mL Oral TID BM   lidocaine  1 patch Transdermal Q24H   methocarbamol  1,000 mg Oral TID   pantoprazole (PROTONIX) IV  40 mg Intravenous Q24H   sodium chloride flush  10-40 mL Intracatheter Q12H   Continuous Infusions:  sodium chloride 10 mL/hr at 09/02/20 0645     LOS: 18 days    Time spent: 25 mins.More than 50% of that time was spent in counseling and/or coordination of care.      Nita Sells, MD [facility] P8/04/2020, 4:37 PM

## 2020-09-16 NOTE — Progress Notes (Signed)
Physical Therapy Treatment Patient Details Name: William Norman MRN: [id number] DOB: [birth year] Today's Date: 09/16/2020    History of Present Illness 80 y.o. male presenting with abdominal pain 08/28/20. +small bowel obstruction, rate-controlled afib; 09/02/20 S/P exploratory laparotomy, extensive LOA, SBR x1    PMH significant for atrial fibrillation on Eliquis, CAD, history of CVA, hypertension, perforated bowel in 2010, colostomy reversal in 2011, and SBO in 2016 that resolved with conservative management, now    PT Comments    Pt is making good progress with PT, ambulating an increased distance of up to ~120 ft with a RW. Pt required min guard assist the majority of the distance, but as he fatigued his instability increased in that he needed intermittent minA to maintain his safety. He is at risk for falls. He also has difficulty managing his RW, particularly during turns and in tight spaces. Completed session with lower extremity exercises to increase his strength for improved functional mobility independence and safety. Will continue to follow acutely. Current recommendations remain appropriate.    Follow Up Recommendations  SNF     Equipment Recommendations  None recommended by PT    Recommendations for Other Services       Precautions / Restrictions Precautions Precautions: Fall Precaution Comments: monitor HR Restrictions Weight Bearing Restrictions: No    Mobility  Bed Mobility Overal bed mobility: Needs Assistance Bed Mobility: Rolling;Sidelying to Sit Rolling: Supervision Sidelying to sit: Supervision;HOB elevated       General bed mobility comments: Supervision and extra time using bed rail to transition to sit EOB.    Transfers Overall transfer level: Needs assistance Equipment used: Rolling walker (2 wheeled) Transfers: Sit to/from Stand Sit to Stand: Min guard         General transfer comment: Pt trying to pull up on RW repeatedly, needing  repeated cues to push up from current sitting surface instead, improved carryover with subsequent reps.  Ambulation/Gait Ambulation/Gait assistance: Min guard;Min assist Gait Distance (Feet): 120 Feet (x2 bouts of ~120 ft > ~20 ft) Assistive device: Rolling walker (2 wheeled) Gait Pattern/deviations: Step-through pattern;Trunk flexed;Decreased stride length Gait velocity: reduced Gait velocity interpretation: <1.8 ft/sec, indicate of risk for recurrent falls General Gait Details: Pt needing cues to look superiorly to improve posture. Repeated cues to remain proximal to RW, increased difficulty doing so during turns and when negotiating in tight spaces. Pt bumps many obstacles with RW in tight spaces. Cues to increase bil step length and feet clearance. Min guard assist majority of time, but intermittent minA as pt fatigued and displaying increased instability.   Stairs             Wheelchair Mobility    Modified Rankin (Stroke Patients Only)       Balance Overall balance assessment: Needs assistance Sitting-balance support: No upper extremity supported;Feet supported Sitting balance-Leahy Scale: Fair Sitting balance - Comments: Min guard-supervision to sit statically EOB.   Standing balance support: Bilateral upper extremity supported Standing balance-Leahy Scale: Poor Standing balance comment: Reliant on UE support.                            Cognition Arousal/Alertness: Awake/alert Behavior During Therapy: WFL for tasks assessed/performed Overall Cognitive Status: Within Functional Limits for tasks assessed                                 General Comments:  motivated to get OOB today      Exercises General Exercises - Lower Extremity Hip Flexion/Marching: Strengthening;Both;10 reps;Standing (with RW) Toe Raises: Strengthening;Both;10 reps;Seated Heel Raises: Strengthening;Both;10 reps;Seated Other Exercises Other Exercises: Sit to stand  5x in a row from recliner, cuing pt to progress from pushing up with bil UEs to pushing up with only 1    General Comments General comments (skin integrity, edema, etc.): HR up to 121 bpm while monitoring regularly during session, but alarmed that it went up to 141, possibly while eyes were off monitor and on pt during mobility      Pertinent Vitals/Pain Pain Assessment: No/denies pain    Home Living                      Prior Function            PT Goals (current goals can now be found in the care plan section) Acute Rehab PT Goals Patient Stated Goal: to walk PT Goal Formulation: With patient/family Time For Goal Achievement: 09/30/20 Potential to Achieve Goals: Good Progress towards PT goals: Progressing toward goals    Frequency    Min 3X/week      PT Plan Current plan remains appropriate    Co-evaluation              AM-PAC PT "6 Clicks" Mobility   Outcome Measure  Help needed turning from your back to your side while in a flat bed without using bedrails?: A Little Help needed moving from lying on your back to sitting on the side of a flat bed without using bedrails?: A Little Help needed moving to and from a bed to a chair (including a wheelchair)?: A Little Help needed standing up from a chair using your arms (e.g., wheelchair or bedside chair)?: A Little Help needed to walk in hospital room?: A Little Help needed climbing 3-5 steps with a railing? : A Little 6 Click Score: 18    End of Session Equipment Utilized During Treatment: Gait belt Activity Tolerance: Patient tolerated treatment well Patient left: in chair;with call bell/phone within reach;with chair alarm set;with family/visitor present Nurse Communication: Mobility status;Other (comment) (HR) PT Visit Diagnosis: Difficulty in walking, not elsewhere classified (R26.2);Unsteadiness on feet (R26.81);Other abnormalities of gait and mobility (R26.89);Muscle weakness (generalized)  (M62.81)     Time: [id number] PT Time Calculation (min) (ACUTE ONLY): 29 min  Charges:  $Gait Training: 8-22 mins $Therapeutic Exercise: 8-22 mins                     Moishe Spice, PT, DPT Acute Rehabilitation Services  Pager: [phone] Office: [place] 09/16/2020, 5:23 PM

## 2020-09-17 LAB — COMPREHENSIVE METABOLIC PANEL
ALT: 108 U/L — ABNORMAL HIGH (ref 0–44)
AST: 74 U/L — ABNORMAL HIGH (ref 15–41)
Albumin: 2 g/dL — ABNORMAL LOW (ref 3.5–5.0)
Alkaline Phosphatase: 123 U/L (ref 38–126)
Anion gap: 10 (ref 5–15)
BUN: 30 mg/dL — ABNORMAL HIGH (ref 8–23)
CO2: 22 mmol/L (ref 22–32)
Calcium: 9.4 mg/dL (ref 8.9–10.3)
Chloride: 104 mmol/L (ref 98–111)
Creatinine, Ser: 1.14 mg/dL (ref 0.61–1.24)
GFR, Estimated: >60 mL/min (ref >60)
Glucose, Bld: 133 mg/dL — ABNORMAL HIGH (ref 70–99)
Potassium: 3.9 mmol/L (ref 3.5–5.1)
Sodium: 136 mmol/L (ref 135–145)
Total Bilirubin: 1.4 mg/dL — ABNORMAL HIGH (ref 0.3–1.2)
Total Protein: 6.1 g/dL — ABNORMAL LOW (ref 6.5–8.1)

## 2020-09-17 LAB — SARS CORONAVIRUS 2 (TAT 6-24 HRS): SARS Coronavirus 2: NEGATIVE

## 2020-09-17 MED ORDER — ATENOLOL 25 MG PO TABS: 25.0000 mg | ORAL_TABLET | Freq: Every day | ORAL | 3 refills | Status: DC

## 2020-09-17 MED ORDER — TAMSULOSIN HCL 0.4 MG PO CAPS: 0.4000 mg | ORAL_CAPSULE | Freq: Every day | ORAL | 0 refills | Status: DC

## 2020-09-17 MED ORDER — METHOCARBAMOL 500 MG PO TABS: 1000.0000 mg | ORAL_TABLET | Freq: Three times a day (TID) | ORAL | 0 refills | Status: DC

## 2020-09-17 MED ORDER — PANTOPRAZOLE SODIUM 40 MG PO TBEC
40.0000 mg | DELAYED_RELEASE_TABLET | Freq: Every day | ORAL | Status: DC
  Administered 2020-09-18: 40 mg via ORAL
  Filled 2020-09-17: qty 1

## 2020-09-17 MED ORDER — PANTOPRAZOLE SODIUM 40 MG PO TBEC: 40.0000 mg | DELAYED_RELEASE_TABLET | Freq: Every day | ORAL | Status: DC

## 2020-09-17 NOTE — Progress Notes (Signed)
Md notified us that the patient will go with Foley catheter in place

## 2020-09-17 NOTE — Progress Notes (Signed)
Occupational Therapy Treatment Patient Details Name: William Norman MRN: [id number] DOB: 14-Jun-1940 Today's Date: 09/17/2020    History of present illness 80 y.o. male presenting with abdominal pain 08/28/20. +small bowel obstruction, rate-controlled afib; 09/02/20 S/P exploratory laparotomy, extensive LOA, SBR x1    PMH significant for atrial fibrillation on Eliquis, CAD, history of CVA, hypertension, perforated bowel in 2010, colostomy reversal in 2011, and SBO in 2016 that resolved with conservative management, now   OT comments  Pt ready to go to rehab this session. Did not want to get out of bed until it was time for him to leave. Re-educated and demonstrated exercises to  work on while in bed and sitting to improve strength. Educated on next steps with SNF. OT will continue to follow up acutely.    Follow Up Recommendations  SNF;Supervision - Intermittent    Equipment Recommendations  None recommended by OT    Recommendations for Other Services      Precautions / Restrictions Precautions Precautions: Fall Precaution Comments: monitor HR Restrictions Weight Bearing Restrictions: No       Mobility Bed Mobility Overal bed mobility: Needs Assistance Bed Mobility: Supine to Sit;Sit to Supine     Supine to sit: Supervision Sit to supine: Supervision        Transfers                 General transfer comment: Pt deferred this sessioj    Balance Overall balance assessment: Mild deficits observed, not formally tested     Sitting balance - Comments: sup sitting EOB                                   ADL either performed or assessed with clinical judgement   ADL Overall ADL's : Needs assistance/impaired                                       General ADL Comments: focused on education and exercises     Vision       Perception     Praxis      Cognition Arousal/Alertness: Awake/alert Behavior During Therapy: WFL for  tasks assessed/performed Overall Cognitive Status: Within Functional Limits for tasks assessed                                 General Comments: Wanting to stay in bed this session'        Exercises Exercises: General Lower Extremity;Other exercises General Exercises - Lower Extremity Ankle Circles/Pumps: AROM;Both;10 reps Quad Sets: AROM;Both;10 reps;Supine Gluteal Sets: AROM;Both;10 reps;Seated Long Arc Quad: AROM;Both;10 reps;Seated Hip Flexion/Marching: Both;10 reps;Seated Other Exercises Other Exercises: chair push ups Other Exercises: shoulder shrugs x10   Shoulder Instructions       General Comments VSS    Pertinent Vitals/ Pain       Pain Assessment: No/denies pain  Home Living                                          Prior Functioning/Environment              Frequency  Min 2X/week        Progress  Toward Goals  OT Goals(current goals can now be found in the care plan section)  Progress towards OT goals: Progressing toward goals  Acute Rehab OT Goals Patient Stated Goal: To go to rehab OT Goal Formulation: With patient Time For Goal Achievement: 10/01/20 Potential to Achieve Goals: Good ADL Goals Pt Will Perform Grooming: Independently;standing Pt Will Perform Lower Body Bathing: with modified independence;with adaptive equipment;sit to/from stand Pt Will Perform Lower Body Dressing: with modified independence;with adaptive equipment;sit to/from stand Pt Will Transfer to Toilet: with modified independence;ambulating;bedside commode Pt Will Perform Toileting - Clothing Manipulation and hygiene: with modified independence;sit to/from stand Additional ADL Goal #1: Pt will be Mod I in and OOB for basic ADLs (HOB down, no rail, increased time)  Plan Frequency remains appropriate;Discharge plan remains appropriate    Co-evaluation                 AM-PAC OT "6 Clicks" Daily Activity     Outcome Measure   Help  from another person eating meals?: None Help from another person taking care of personal grooming?: A Little Help from another person toileting, which includes using toliet, bedpan, or urinal?: A Little Help from another person bathing (including washing, rinsing, drying)?: A Lot Help from another person to put on and taking off regular upper body clothing?: A Little Help from another person to put on and taking off regular lower body clothing?: A Lot 6 Click Score: 17    End of Session    OT Visit Diagnosis: Unsteadiness on feet (R26.81);Muscle weakness (generalized) (M62.81);Pain Pain - part of body:  (abdomen)   Activity Tolerance Patient tolerated treatment well;Patient limited by fatigue   Patient Left in bed;with call bell/phone within reach;with family/visitor present   Nurse Communication Mobility status        Time: [id number] OT Time Calculation (min): 10 min  Charges: OT General Charges $OT Visit: 1 Visit OT Treatments $Therapeutic Exercise: 8-22 mins  Gerrianne Aydelott H., OTR/L Acute Rehabilitation  Carnella Fryman Elane Yolanda Bonine 09/17/2020, 3:37 PM

## 2020-09-17 NOTE — Discharge Summary (Signed)
Physician Discharge Summary  William Norman [id number] DOB: 07/09/1940 DOA: 08/28/2020  PCP: Vivi Barrack, MD  Admit date: 08/28/2020 Discharge date: 09/17/2020  Time spent: 25 minutes  Recommendations for Outpatient Follow-up:  Voiding trial as OP at SNF or at Urology office [facility], [facility] in 3 days as OP   Discharge Diagnoses:  MAIN problem for hospitalization   SBO s/p surgery  Please see below for itemized issues addressed in [place]- refer to other progress notes for clarity if needed  Discharge Condition: improved  Diet recommendation: regular  Filed Weights   08/30/20 0501 09/01/20 0457 09/14/20 1345  Weight: 99.1 kg 96.5 kg 96 kg    History of present illness:  80 home dwell WM atrial fibrillation [place] score >4 on Eliquis, coronary artery disease, history of CVA,, hypertension, perforated bowel in 2010 colostomy status post colostomy reversal in 2011 and small bowel obstruction 2016 that resolved with conservative management who presented with abdominal pain on August 28, 2020--closed loop bowel obstruction.  General surgery consulted and he underwent expiratory laparotomy, extensive lysis evaluation, small bowel resection on 09/02/2020.   CT abdomen/pelvis follow-up done on 09/09/20 showed large heterogeneous collection in the anterior pelvis consistent with hematoma.  IR consulted  and hematoma was aspirated, AC stopped  Hospital Course:  1.Small bowel obstruction  Presented with abdominal pain Patient was found to have closed loop bowel obstruction.  General surgery consulted and he underwent expiratory laparotomy, extensive lysis evaluation, small bowel resection on 09/02/2020. No further TPN--full diet WOC care per surgery--Pain controlled without meds opiates, may use methocarbamol sparingly for spasm Wound VAC has been removed-outpatient follow-up with general surgery as per their instructions   2.  Hematoma  CT abdomen/pelvis follow-up done on 09/09/20  showed large( 18 x 6.8 cm) heterogeneous collection in the anterior pelvis consistent with hematoma.  IR consulted and he underwent aspiration of the hematoma. Hemoglobin has remained stable restarted on Zosyn again, cx from aspiration has NGTD.Abx stopped.  Denies any abdominal pain today.  3.  Paroxysmal atrial fibrillation rate controlled He was on heparin drip, discontinued after finding of hematoma. Continue atenolol on reduced dose Restarted Eliquis.Currently he is in NSR  4.  Urinary tract infection /LUTS with urinary retention Patient was started on ceftriaxone and he has had 3 doses He was resumed on his flomax--unfortunately he developed Urinary retention and will need voiding trial as OP at skilled facility and may need referral to urology-I discussed this all with his wife  5.  History of CVA  / coronary artery disease On statin, beta-blocker and Eliquis .   Continue atenolol-, amlodipine-titrate as per BP   6.  Hypertension Monitor blood pressure.  Blood pressure stabilized this admit   7.  Leukocytosis resolved   8.  Debility/deconditioning Patient seen by PT/OT and recommended SNF  on discharge.  TOC consulted.  Patient is agreeable   Nutrition Problem: Severe Malnutrition Etiology: acute illness (SBO with no oral intake x 5 days)   Discharge Exam: Vitals:   09/17/20 0400 09/17/20 0803  BP: 102/66 114/67  Pulse: 89 87  Resp: 17 18  Temp: 98.2 F (36.8 C) 98.4 F (36.9 C)  SpO2: 95% 96%    Subj on day of d/c   Awake coherent no distress sitting on toilet passing gas Foley catheter in place Cannot remember some of the details of our conversation yesterday-at his request called his wife  General Exam on discharge  EOMI NCAT frail Chest clear no added sound  no rales no rhonchi S1-S2 no murmur no rub no gallop Abdomen soft slight distention no rebound Neurologically intact-slightly forgetful-motor intact x4 sensory and further neurological exam  deferred  Discharge Instructions   Discharge Instructions     Diet - low sodium heart healthy   Complete by: As directed    Increase activity slowly   Complete by: As directed    No wound care   Complete by: As directed       Allergies as of 09/17/2020       Reactions   Other Other (See Comments)   Some form of numbing medication used by dentist, almost died   Simvastatin Other (See Comments)   Muscle aches        Medication List     STOP taking these medications    lisinopril 40 MG tablet Commonly known as: ZESTRIL       TAKE these medications    acetaminophen 650 MG CR tablet Commonly known as: TYLENOL Take 650 mg by mouth daily as needed for pain.   amLODipine 5 MG tablet Commonly known as: NORVASC TAKE 1 TABLET BY MOUTH EVERY OTHER DAY   apixaban 5 MG Tabs tablet Commonly known as: ELIQUIS Take 1 tablet (5 mg total) by mouth 2 (two) times daily.   atenolol 25 MG tablet Commonly known as: TENORMIN Take 1 tablet (25 mg total) by mouth daily. Start taking on: September 18, 2020 What changed:  medication strength how much to take   atorvastatin 40 MG tablet Commonly known as: LIPITOR TAKE 1/2 TABLET BY MOUTH DAILY What changed: when to take this   CoQ10 200 MG Caps Take 1 capsule by mouth daily. What changed: how much to take   hydroxypropyl methylcellulose / hypromellose 2.5 % ophthalmic solution Commonly known as: ISOPTO TEARS / GONIOVISC Place 1 drop into both eyes 2 (two) times daily as needed for dry eyes.   MELATONIN PO Take 1 tablet by mouth at bedtime.   methocarbamol 500 MG tablet Commonly known as: ROBAXIN Take 2 tablets (1,000 mg total) by mouth 3 (three) times daily.   nitroGLYCERIN 0.4 MG SL tablet Commonly known as: NITROSTAT Dissolve 1 tablet under the tongue every 5 minutes as needed What changed:  how much to take how to take this when to take this reasons to take this additional instructions   oxymetazoline 0.05 %  nasal spray Commonly known as: AFRIN Place 2 sprays into both nostrils at bedtime.   pantoprazole 40 MG tablet Commonly known as: PROTONIX Take 1 tablet (40 mg total) by mouth daily. Start taking on: September 18, 2020   tamsulosin 0.4 MG Caps capsule Commonly known as: FLOMAX Take 1 capsule (0.4 mg total) by mouth daily after supper. What changed: when to take this   Vitamin B-12 1000 MCG Subl Place 1 tablet (1,000 mcg total) under the tongue daily.       Allergies  Allergen Reactions   Other Other (See Comments)    Some form of numbing medication used by dentist, almost died   Simvastatin Other (See Comments)    Muscle aches       The results of significant diagnostics from this hospitalization (including imaging, microbiology, ancillary and laboratory) are listed below for reference.    Significant Diagnostic Studies: DG Abd 1 View  Result Date: 08/30/2020 CLINICAL DATA:  80 year old male status post nasogastric tube placement. EXAM: ABDOMEN - 1 VIEW COMPARISON:  08/29/2020. FINDINGS: Nasogastric tube extends into the proximal stomach with side  port at or immediately distal to the gastroesophageal junction. Visualized bowel gas pattern is unremarkable. Visualized portions of the lungs suggest a background of interstitial pulmonary edema. Mild cardiomegaly. IMPRESSION: 1. Tip of nasogastric tube is in the proximal stomach and could be advanced approximately 10 cm for more optimal placement. 2. Chest is incompletely imaged, but visualized portions suggests underlying congestive heart failure. Follow-up chest radiograph is recommended. Electronically Signed   By: [name] M.D.   On: 08/30/2020 07:50   CT ABDOMEN PELVIS W CONTRAST  Result Date: 09/09/2020 CLINICAL DATA:  Abdominal pain, fever, post-op leukocytosis, prolonged post op ileus EXAM: CT ABDOMEN AND PELVIS WITH CONTRAST TECHNIQUE: Multidetector CT imaging of the abdomen and pelvis was performed using the standard  protocol following bolus administration of intravenous contrast. CONTRAST:  195m OMNIPAQUE IOHEXOL 300 MG/ML  SOLN COMPARISON:  CT 08/28/2020 FINDINGS: Lower chest: Bibasilar atelectasis, small left and trace right effusions. Mild paraseptal emphysema. Coronary artery calcifications. Prominent left atrium. Hepatobiliary: No focal liver abnormality is seen. Cholelithiasis no evidence of cholecystitis. Pancreas: Unremarkable. No pancreatic ductal dilatation or surrounding inflammatory changes. Spleen: Normal in size without focal abnormality. Adrenals/Urinary Tract: Adrenal glands are unremarkable. No hydronephrosis. Unchanged right renal cyst. The bladder is decompressed with Foley catheter in place in with intraluminal gas. Stomach/Bowel: Small hiatal hernia. The stomach is within normal limits. There is no evidence of bowel obstruction. Evidence of small bowel resection. Oral contrast material is noted throughout the small bowel and within the ascending and transverse colon. There is no evidence of contrast leak the appendix is normal. There is extensive postsurgical inflammatory change in the mid to lower central abdomen and anterior pelvis. In the anterior pelvis, there is a heterogeneous collection, on the right side of this collection there is low-density fluid with a fluid fluid level layering posteriorly with more dense material, and the left anterior pelvis there is a or heterogeneous higher density appearance. This entire area measures approximately 18.0 x 6.8 cm. Vascular/Lymphatic: Aortoiliac atherosclerotic calcifications. No AAA. No lymphadenopathy. Reproductive: Mildly enlarged prostate. Other: Prior midline incision. Musculoskeletal: No acute osseous abnormality. No suspicious lytic or blastic lesions. Multilevel degenerative changes of the spine. IMPRESSION: Large heterogeneous collection in the anterior pelvis measuring 18.0 x 6.8 cm in the axial dimension, favored to represent a hematoma with  varying ages of blood products. Given the patient's leukocytosis, an infected hematoma is possible. Recent small-bowel resection with extensive postsurgical inflammatory changes in the central mid abdomen and pelvis. No evidence of anastomotic leak. No evidence of bowel obstruction. These results were called by telephone at the time of interpretation on 09/09/2020 at 9:11 pm to provider JGershon Cull who verbally acknowledged these results. Electronically Signed   By: JMaurine Simmering  On: 09/09/2020 21:13   CT Abdomen Pelvis W Contrast  Result Date: 08/28/2020 CLINICAL DATA:  Diverticulitis suspected. Sudden onset abdominal pain. EXAM: CT ABDOMEN AND PELVIS WITH CONTRAST TECHNIQUE: Multidetector CT imaging of the abdomen and pelvis was performed using the standard protocol following bolus administration of intravenous contrast. CONTRAST:  1054mOMNIPAQUE IOHEXOL 300 MG/ML  SOLN COMPARISON:  CT abdomen 04/01/14 FINDINGS: Lower chest: Lung bases are clear. Hepatobiliary: No focal hepatic lesion. No biliary duct dilatation. Common bile duct is normal. Pancreas: Pancreas is normal. No ductal dilatation. No pancreatic inflammation. Spleen: Normal spleen Adrenals/urinary tract: Adrenal glands and kidneys normal. Small cortical lesion in the RIGHT kidney is not significant changed from CT 2016. There are multiple diverticula of the bladder along the  posterior and anterior wall. Stomach/Bowel: Small hiatal hernia.  Stomach and duodenum normal. A loop of bowel in the LEFT upper quadrant is fluid-filled and has mild inflammatory stranding adjacent to loops (image 39/2). There is air-fluid level within these loops. The loops are mildly dilated up to 3.5 cm (image 39/2.). The bowel upstream and downstream of this dilated loop are small in caliber and the loop has a folded pattern on sagittal imaging (118/6) which could indicate close loop obstruction anatomy. Downstream small bowel is normal. Appendix normal. Ascending  transverse colon normal. There are scattered diverticula of the transverse colon. There is descending colon is collapsed. No diverticulitis of the descending colon. Rectosigmoid colon normal. Vascular/Lymphatic: Abdominal aorta is normal caliber. No periportal or retroperitoneal adenopathy. No pelvic adenopathy. Reproductive: Other: No free fluid. Musculoskeletal: No aggressive osseous lesion. IMPRESSION: 1. Inflamed loop of small bowel LEFT upper quadrant with potential closed loop anatomy. This could progress to high-grade obstruction. Recommend surgical consultation. 2. No diverticulitis. 3. Multiple bladder diverticula. Electronically Signed   By: Suzy Bouchard M.D.   On: 08/28/2020 21:07   CT ASPIRATION  Result Date: 09/10/2020 INDICATION: 80 year old with postoperative fluid collections in the anterior pelvis. EXAM: CT-GUIDED ASPIRATION OF PELVIC FLUID COLLECTION MEDICATIONS: Moderate sedation ANESTHESIA/SEDATION: Fentanyl 100 mcg IV; Versed 2.0 mg IV Moderate Sedation Time:  12 minutes The patient was continuously monitored during the procedure by the interventional radiology nurse under my direct supervision. COMPLICATIONS: None immediate. PROCEDURE: Informed written consent was obtained from the patient after a thorough discussion of the procedural risks, benefits and alternatives. All questions were addressed. A timeout was performed prior to the initiation of the procedure. Patient was placed supine. CT images through the lower abdomen and pelvis were obtained. Low density collection in the anterior pelvis just right of midline was targeted. The right side of the lower abdomen and pelvis was prepped with chlorhexidine and sterile field was created. Skin was anesthetized with 1% lidocaine. Small incision was made. Using CT guidance, an 18 gauge trocar needle was directed into the anterior right pelvic collection and approximately 90 mL of dark red fluid was removed. Follow up CT images were  obtained. Needle was removed. Bandage placed over the puncture site. FINDINGS: Low-density collection in the anterior pelvis at midline and just right of midline. Hyperdense collection along the anterior left side of the pelvis compatible with a hematoma. Needle was directed into the low-density right-sided collection. 90 mL of bloody fluid was removed. Collection was partially decompressed at the end of the procedure. IMPRESSION: CT-guided aspiration of the anterior pelvic fluid collection. Findings are compatible with liquefied hematoma. Fluid was sent for culture. Electronically Signed   By: Markus Daft M.D.   On: 09/10/2020 15:39   DG CHEST PORT 1 VIEW  Result Date: 09/02/2020 CLINICAL DATA:  PICC placement. EXAM: PORTABLE CHEST 1 VIEW COMPARISON:  Earlier today. FINDINGS: Right upper extremity PICC is been retracted, tip now in the mid SVC. No pneumothorax. Persistent low lung volumes with bibasilar atelectasis and vascular crowding. Enteric tube remains in place. Stable heart size and mediastinal contours. IMPRESSION: 1. Tip of the right upper extremity PICC in the mid SVC. No pneumothorax. 2. Persistent low lung volumes with bibasilar atelectasis and vascular crowding. Electronically Signed   By: Keith Rake M.D.   On: 09/02/2020 23:02   DG CHEST PORT 1 VIEW  Result Date: 09/02/2020 CLINICAL DATA:  PICC line placement EXAM: PORTABLE CHEST 1 VIEW COMPARISON:  04/22/2019 FINDINGS: Lung volumes are  small and there is resultant vascular crowding at the hila. Mild bibasilar atelectasis. No pneumothorax or pleural effusion. Right upper extremity PICC line tip is seen within the mid right atrium, roughly 4 cm distal to the superior cavoatrial junction. Nasogastric tube extends into the left upper quadrant of the abdomen. Cardiac size is within normal limits when accounting for poor pulmonary insufflation. No acute bone abnormality. IMPRESSION: Right upper extremity PICC line tip within the right atrium 4  cm distal to the superior cavoatrial junction. Pulmonary hypoinflation with bibasilar atelectasis. Electronically Signed   By: Fidela Salisbury MD   On: 09/02/2020 21:46   DG Abd Portable 1V  Result Date: 09/06/2020 CLINICAL DATA:  NG tube placement EXAM: PORTABLE ABDOMEN - 1 VIEW COMPARISON:  09/02/2020 abdominal radiograph FINDINGS: Enteric tube terminates in the proximal stomach. No dilated small bowel loops. Mild colonic gas and stool. No evidence of pneumatosis or pneumoperitoneum. Probable small left pleural effusion with mild left lung base dense opacity. IMPRESSION: Enteric tube terminates in the proximal stomach. Probable small left pleural effusion with dense mild left lung base opacity favoring atelectasis. Electronically Signed   By: Ilona Sorrel M.D.   On: 09/06/2020 09:08   DG Abd Portable 1V  Result Date: 09/02/2020 CLINICAL DATA:  Small-bowel obstruction. EXAM: PORTABLE ABDOMEN - 1 VIEW COMPARISON:  09/01/2020. FINDINGS: Upper abdomen not imaged. Continued progression of prominent small bowel distention with a paucity of colonic gas. Findings again consistent with persistent small-bowel obstruction. No free air identified. Degenerative changes lumbar spine and both hips. IMPRESSION: Continued progression of prominent small bowel distention. Findings consistent with persistent small-bowel obstruction. Electronically Signed   By: Marcello Moores  Register   On: 09/02/2020 08:50   DG Abd Portable 1V  Result Date: 09/01/2020 CLINICAL DATA:  Small-bowel obstruction. EXAM: PORTABLE ABDOMEN - 1 VIEW COMPARISON:  08/31/2020.  08/30/2020.  CT 08/28/2020. FINDINGS: NG tube noted with tip in stable position over the upper stomach. Progressive small-bowel distention. Small-bowel dilatation up to 5.7 cm noted. Colon is nondistended. No free air noted. No acute bony abnormality. IMPRESSION: 1. NG tube noted with tip in stable position over the upper stomach. 2. Progressive small-bowel distention. Findings again  consistent with small bowel obstruction. Electronically Signed   By: Marcello Moores  Register   On: 09/01/2020 06:21   DG Abd Portable 1V  Result Date: 08/31/2020 CLINICAL DATA:  Nasogastric tube placement EXAM: PORTABLE ABDOMEN - 1 VIEW COMPARISON:  None. FINDINGS: Nasogastric tube tip within the mid body of the stomach. Visualized abdominal gas pattern is unremarkable. No gross free intraperitoneal gas. Right flank and pelvis excluded from view. IMPRESSION: Nasogastric tube tip within the mid body of the stomach. Electronically Signed   By: Fidela Salisbury MD   On: 08/31/2020 17:55   DG Abd Portable 1V  Result Date: 08/31/2020 CLINICAL DATA:  Small bowel obstruction. EXAM: PORTABLE ABDOMEN - 1 VIEW COMPARISON:  08/30/2020 FINDINGS: An enteric tube remains looped in the region of the gastric fundus. There are a few mildly dilated gas-filled loops of small bowel in the left mid abdomen which are more conspicuous than on the prior study. Gas is present in nondilated colon. Heterogeneous bibasilar pulmonary airspace opacities are suboptimally evaluated due to overpenetration. IMPRESSION: Mildly dilated small bowel loops in the left mid abdomen which could reflect partial obstruction. Electronically Signed   By: Logan Bores M.D.   On: 08/31/2020 08:45   DG Abd Portable 1V  Result Date: 08/30/2020 CLINICAL DATA:  Nasogastric tube replacement EXAM:  PORTABLE ABDOMEN - 1 VIEW COMPARISON:  Earlier film of the same day FINDINGS: Nasogastric tube loops in the decompressed gastric fundus. A few gas filled mid abdominal small bowel loops. The right lateral and inferior abdomen are excluded. IMPRESSION: Nasogastric tube loops in the gastric fundus. Electronically Signed   By: Lucrezia Europe M.D.   On: 08/30/2020 13:42   DG Abd Portable 1V  Result Date: 08/30/2020 CLINICAL DATA:  Small-bowel obstruction EXAM: PORTABLE ABDOMEN - 1 VIEW COMPARISON:  08/29/2020, 08/28/2020 FINDINGS: Enteric tube terminates within the gastric  body. Abnormally dilated loop of small bowel within the left hemiabdomen measuring up to 4.5 cm in diameter, which appears to be thickened. Faint dilute contrast within small bowel, but not definitively seen within the colon. Excreted contrast within the urinary bladder. No gross pneumoperitoneum. IMPRESSION: Abnormally dilated loop of small bowel within the left hemiabdomen, which appears to be thickened. Faint dilute contrast within small bowel, but not definitively seen within the colon. Findings concerning for worsening small bowel obstruction, possibly closed loop given appearance on previous CT. Electronically Signed   By: Davina Poke D.O.   On: 08/30/2020 10:23   DG Abd Portable 1V-Small Bowel Obstruction Protocol-initial, 8 hr delay  Result Date: 08/29/2020 CLINICAL DATA:  Small-bowel obstruction EXAM: PORTABLE ABDOMEN - 1 VIEW COMPARISON:  08/29/2020 FINDINGS: Three supine frontal views of the abdomen and pelvis are obtained after the administration of Gastroview approximately 10 hours prior to imaging. Enteric catheter is seen within the gastric lumen. There is significant retention oral contrast within the stomach. Slow diffusion of oral contrast is seen filling dilated loops of small bowel within the lower central abdomen and pelvis. No evidence of transit of contrast into the colon at the time of imaging. Excreted contrast is seen within the urinary bladder. IMPRESSION: 1. Dilute oral contrast filling multiple dilated loops of small bowel throughout the lower abdomen and pelvis, compatible with small-bowel obstruction. No contrast identified within the colon at the time of imaging. 2. Significant retention of oral contrast within the stomach, with indwelling enteric catheter as above. Electronically Signed   By: Randa Ngo M.D.   On: 08/29/2020 15:49   DG Abd Portable 1V  Result Date: 08/29/2020 CLINICAL DATA:  80 year old male NG tube placement. EXAM: PORTABLE ABDOMEN - 1 VIEW  COMPARISON:  CT Abdomen and Pelvis 08/28/2020. FINDINGS: Portable AP supine view at 0411 hours. Enteric tube placed into the stomach. Side hole is at the level of the gastric body. Stable visible bowel gas pattern from the recent CT. No acute cardiopulmonary abnormality identified. IMPRESSION: NG tube placed into the stomach, side hole at the gastric body. Electronically Signed   By: Genevie Ann M.D.   On: 08/29/2020 04:29   [place] EKG SITE RITE  Result Date: 09/02/2020 If Site Rite image not attached, placement could not be confirmed due to current cardiac rhythm.   Microbiology: Recent Results (from the past 240 hour(s))  Aerobic/Anaerobic Culture w Gram Stain (surgical/deep wound)     Status: None   Collection Time: 09/10/20  1:16 PM   Specimen: Abdomen; Abdominal Fluid  Result Value Ref Range Status   Specimen Description ABDOMEN FLUID  Final   Special Requests ABDOMEN  Final   Gram Stain   Final    ABUNDANT WBC PRESENT, PREDOMINANTLY PMN NO ORGANISMS SEEN    Culture   Final    No growth aerobically or anaerobically. Performed at [facility], [place] [address], [place], [place] [address]  Report Status 09/15/2020 FINAL  Final     Labs: Basic Metabolic Panel: Recent Labs  Lab 09/11/20 0345 09/13/20 0021 09/14/20 0353 09/15/20 0334 09/17/20 1245  NA 132* 133* 135 136 136  K 4.4 4.4 4.4 4.3 3.9  CL 103 105 106 105 104  CO2 '23 23 23 25 22  '$ GLUCOSE 115* 120* 116* 122* 133*  BUN 26* 24* 23 24* 30*  CREATININE 0.81 0.78 0.79 0.71 1.14  CALCIUM 8.8* 8.6* 8.7* 8.6* 9.4  MG  --  2.3 2.1 2.0  --   PHOS  --  3.3 3.3 2.7  --    Liver Function Tests: Recent Labs  Lab 09/14/20 0353 09/17/20 1245  AST 107* 74*  ALT 145* 108*  ALKPHOS 137* 123  BILITOT 1.1 1.4*  PROT 5.5* 6.1*  ALBUMIN 1.8* 2.0*   No results for input(s): LIPASE, AMYLASE in the last 168 hours. No results for input(s): AMMONIA in the last 168 hours. CBC: Recent Labs  Lab 09/11/20 0345 09/12/20 0327  09/13/20 0021 09/14/20 0353 09/15/20 0334  WBC 16.7* 15.9* 14.3* 12.5* 10.1  NEUTROABS 11.3* 11.2* 10.3* 8.9*  --   HGB 9.1* 9.0* 9.1* 9.4* 9.1*  HCT 28.4* 28.8* 28.4* 29.5* 29.2*  MCV 93.7 94.4 92.2 94.6 94.8  PLT 289 314 365 384 394   Cardiac Enzymes: No results for input(s): CKTOTAL, CKMB, CKMBINDEX, TROPONINI in the last 168 hours. BNP: BNP (last 3 results) No results for input(s): BNP in the last 8760 hours.  ProBNP (last 3 results) No results for input(s): PROBNP in the last 8760 hours.  CBG: Recent Labs  Lab 09/14/20 0431 09/14/20 0831 09/14/20 1144 09/14/20 1558 09/14/20 2026  GLUCAP 107* 123* 135* 117* 119*       Signed:  Nita Sells MD   [facility] 09/17/2020, 3:12 PM

## 2020-09-17 NOTE — TOC Progression Note (Signed)
Transition of Care [facility]) - Progression Note    Patient Details  Name: William Norman MRN: [id number] Date of Birth: 1940/04/24  Transition of Care [facility]) CM/SW Contact  Emeterio Reeve, [place] Phone Number: 09/17/2020, 4:51 PM  Clinical Narrative:     Pts insurance was approved for SNF. Pts covid results are still pending. Pt will discharge to [facility]  on 09/18/20. Pts wife is aware.   Expected Discharge Plan: [place] Barriers to Discharge: Continued Medical Work up, Ship broker  Expected Discharge Plan and Services Expected Discharge Plan: [place] arrangements for the past 2 months: Single Family Home Expected Discharge Date: 09/17/20                                     Social Determinants of Health (SDOH) Interventions    Readmission Risk Interventions No flowsheet data found.  Emeterio Reeve, Latanya Presser, [place] Social Worker [phone]

## 2020-09-18 DIAGNOSIS — R5381 Other malaise: Secondary | ICD-10-CM | POA: Diagnosis not present

## 2020-09-18 DIAGNOSIS — Z03818 Encounter for observation for suspected exposure to other biological agents ruled out: Secondary | ICD-10-CM | POA: Diagnosis not present

## 2020-09-18 DIAGNOSIS — I6930 Unspecified sequelae of cerebral infarction: Secondary | ICD-10-CM | POA: Diagnosis not present

## 2020-09-18 DIAGNOSIS — M25511 Pain in right shoulder: Secondary | ICD-10-CM | POA: Diagnosis not present

## 2020-09-18 DIAGNOSIS — R262 Difficulty in walking, not elsewhere classified: Secondary | ICD-10-CM | POA: Diagnosis not present

## 2020-09-18 DIAGNOSIS — R339 Retention of urine, unspecified: Secondary | ICD-10-CM | POA: Diagnosis not present

## 2020-09-18 DIAGNOSIS — I10 Essential (primary) hypertension: Secondary | ICD-10-CM | POA: Diagnosis not present

## 2020-09-18 DIAGNOSIS — M6281 Muscle weakness (generalized): Secondary | ICD-10-CM | POA: Diagnosis not present

## 2020-09-18 DIAGNOSIS — I639 Cerebral infarction, unspecified: Secondary | ICD-10-CM | POA: Diagnosis not present

## 2020-09-18 DIAGNOSIS — R0902 Hypoxemia: Secondary | ICD-10-CM | POA: Diagnosis not present

## 2020-09-18 DIAGNOSIS — I499 Cardiac arrhythmia, unspecified: Secondary | ICD-10-CM | POA: Diagnosis not present

## 2020-09-18 DIAGNOSIS — N390 Urinary tract infection, site not specified: Secondary | ICD-10-CM | POA: Diagnosis not present

## 2020-09-18 DIAGNOSIS — K56699 Other intestinal obstruction unspecified as to partial versus complete obstruction: Secondary | ICD-10-CM | POA: Diagnosis not present

## 2020-09-18 DIAGNOSIS — E119 Type 2 diabetes mellitus without complications: Secondary | ICD-10-CM | POA: Diagnosis not present

## 2020-09-18 DIAGNOSIS — I2510 Atherosclerotic heart disease of native coronary artery without angina pectoris: Secondary | ICD-10-CM | POA: Diagnosis not present

## 2020-09-18 DIAGNOSIS — K56609 Unspecified intestinal obstruction, unspecified as to partial versus complete obstruction: Secondary | ICD-10-CM | POA: Diagnosis not present

## 2020-09-18 DIAGNOSIS — T148XXA Other injury of unspecified body region, initial encounter: Secondary | ICD-10-CM | POA: Diagnosis not present

## 2020-09-18 DIAGNOSIS — I480 Paroxysmal atrial fibrillation: Secondary | ICD-10-CM | POA: Diagnosis not present

## 2020-09-18 DIAGNOSIS — Z8673 Personal history of transient ischemic attack (TIA), and cerebral infarction without residual deficits: Secondary | ICD-10-CM | POA: Diagnosis not present

## 2020-09-18 DIAGNOSIS — Z743 Need for continuous supervision: Secondary | ICD-10-CM | POA: Diagnosis not present

## 2020-09-18 DIAGNOSIS — M7981 Nontraumatic hematoma of soft tissue: Secondary | ICD-10-CM | POA: Diagnosis not present

## 2020-09-18 NOTE — Plan of Care (Signed)
  Problem: Education: Goal: Knowledge of General Education information will improve Description: Including pain rating scale, medication(s)/side effects and non-pharmacologic comfort measures 09/18/2020 1318 by Tora Kindred, RN Outcome: Completed/Met 09/18/2020 1002 by Tora Kindred, RN Outcome: Progressing   Problem: Clinical Measurements: Goal: Ability to maintain clinical measurements within normal limits will improve 09/18/2020 1318 by Tora Kindred, RN Outcome: Completed/Met 09/18/2020 1002 by Tora Kindred, RN Outcome: Progressing Goal: Will remain free from infection 09/18/2020 1318 by Tora Kindred, RN Outcome: Completed/Met 09/18/2020 1002 by Tora Kindred, RN Outcome: Progressing Goal: Diagnostic test results will improve 09/18/2020 1318 by Tora Kindred, RN Outcome: Completed/Met 09/18/2020 1002 by Tora Kindred, RN Outcome: Progressing Goal: Respiratory complications will improve 09/18/2020 1318 by Tora Kindred, RN Outcome: Completed/Met 09/18/2020 1002 by Tora Kindred, RN Outcome: Progressing Goal: Cardiovascular complication will be avoided 09/18/2020 1318 by Tora Kindred, RN Outcome: Completed/Met 09/18/2020 1002 by Tora Kindred, RN Outcome: Progressing   Problem: Nutrition: Goal: Adequate nutrition will be maintained 09/18/2020 1318 by Tora Kindred, RN Outcome: Completed/Met 09/18/2020 1002 by Tora Kindred, RN Outcome: Progressing   Problem: Elimination: Goal: Will not experience complications related to bowel motility 09/18/2020 1318 by Tora Kindred, RN Outcome: Completed/Met 09/18/2020 1002 by Tora Kindred, RN Outcome: Progressing Goal: Will not experience complications related to urinary retention 09/18/2020 1318 by Tora Kindred, RN Outcome: Completed/Met 09/18/2020 1002 by Tora Kindred, RN Outcome: Progressing   Problem: Pain Managment: Goal: General experience of comfort will  improve 09/18/2020 1318 by Tora Kindred, RN Outcome: Completed/Met 09/18/2020 1002 by Tora Kindred, RN Outcome: Progressing   Problem: Safety: Goal: Ability to remain free from injury will improve 09/18/2020 1318 by Tora Kindred, RN Outcome: Completed/Met 09/18/2020 1002 by Tora Kindred, RN Outcome: Progressing   Problem: Education: Goal: Required Educational Video(s) 09/18/2020 1318 by Tora Kindred, RN Outcome: Completed/Met 09/18/2020 1002 by Tora Kindred, RN Outcome: Progressing   Problem: Clinical Measurements: Goal: Ability to maintain clinical measurements within normal limits will improve 09/18/2020 1318 by Tora Kindred, RN Outcome: Completed/Met 09/18/2020 1002 by Tora Kindred, RN Outcome: Progressing Goal: Postoperative complications will be avoided or minimized 09/18/2020 1318 by Tora Kindred, RN Outcome: Completed/Met 09/18/2020 1002 by Tora Kindred, RN Outcome: Progressing   Problem: Skin Integrity: Goal: Demonstration of wound healing without infection will improve 09/18/2020 1318 by Tora Kindred, RN Outcome: Completed/Met 09/18/2020 1002 by Tora Kindred, RN Outcome: Progressing

## 2020-09-18 NOTE — Progress Notes (Signed)
PICC line to be discontinued prior to discharge otherwise patient is reliably stable for discharge to skilled facility with outpatient surgery follow-up  Verneita Griffes, MD [facility] 10:04 AM

## 2020-09-18 NOTE — Plan of Care (Signed)
  Problem: Education: Goal: Knowledge of General Education information will improve Description: Including pain rating scale, medication(s)/side effects and non-pharmacologic comfort measures Outcome: Progressing   Problem: Clinical Measurements: Goal: Ability to maintain clinical measurements within normal limits will improve Outcome: Progressing Goal: Will remain free from infection Outcome: Progressing Goal: Diagnostic test results will improve Outcome: Progressing Goal: Respiratory complications will improve Outcome: Progressing Goal: Cardiovascular complication will be avoided Outcome: Progressing   Problem: Nutrition: Goal: Adequate nutrition will be maintained Outcome: Progressing   Problem: Elimination: Goal: Will not experience complications related to bowel motility Outcome: Progressing Goal: Will not experience complications related to urinary retention Outcome: Progressing   Problem: Pain Managment: Goal: General experience of comfort will improve Outcome: Progressing   Problem: Safety: Goal: Ability to remain free from injury will improve Outcome: Progressing   Problem: Education: Goal: Required Educational Video(s) Outcome: Progressing   Problem: Clinical Measurements: Goal: Ability to maintain clinical measurements within normal limits will improve Outcome: Progressing Goal: Postoperative complications will be avoided or minimized Outcome: Progressing   Problem: Skin Integrity: Goal: Demonstration of wound healing without infection will improve Outcome: Progressing

## 2020-09-18 NOTE — Progress Notes (Signed)
Report called to [facility] and given to nurse [place]. Patient transported to facility via [place]. Wife was called and informed that patient was leaving hospital.

## 2020-09-18 NOTE — TOC Transition Note (Signed)
Transition of Care [facility]) - CM/SW Discharge Note   Patient Details  Name: Ramses Lengle MRN: [id number] Date of Birth: 12-24-1940  Transition of Care [facility]) CM/SW Contact:  Emeterio Reeve, LCSW Phone Number: 09/18/2020, 12:35 PM   Clinical Narrative:     Patient will DC to: [facility] Anticipated DC date: 09/18/20 Family notified: wife, Fish farm manager by: Corey Harold     Per MD patient ready for DC to [facility]. RN, patient, patient's family, and facility notified of DC. Discharge Summary and FL2 sent to facility. DC packet on chart. Pts Josem Kaufmann is received and he is covid negative. Ambulance transport requested for patient.    RN to call report to [phone].  CSW will sign off for now as social work intervention is no longer needed. Please consult [place] again if new needs arise.   Final next level of care: Skilled Nursing Facility Barriers to Discharge: Barriers Resolved   Patient Goals and CMS Choice Patient states their goals for this hospitalization and ongoing recovery are:: Get better CMS Medicare.gov Compare Post Acute Care list provided to:: Patient Choice offered to / list presented to : Patient  Discharge Placement              Patient chooses bed at: [facility] Patient to be transferred to facility by: [facility] Name of family member notified: Wife, Collie Siad Patient and family notified of of transfer: 09/18/20  Discharge Plan and Services                                     Social Determinants of Health (SDOH) Interventions     Readmission Risk Interventions No flowsheet data found.  Emeterio Reeve, Latanya Presser, [place] Social Worker [phone]

## 2020-09-18 NOTE — Discharge Summary (Signed)
Physician Discharge Summary  William Norman [id number] DOB: 06/20/1940 DOA: 08/28/2020  No new changes to discharge summary dated 8/4 other than documentation as below   PCP: Vivi Barrack, MD  Admit date: 08/28/2020 Discharge date: 09/18/2020  Time spent: 25 minutes  Recommendations for Outpatient Follow-up:  Voiding trial as OP at SNF or at Urology office [facility], [facility] in 3 days as OP   Discharge Diagnoses:  MAIN problem for hospitalization   SBO s/p surgery  Please see below for itemized issues addressed in [place]- refer to other progress notes for clarity if needed  Discharge Condition: improved  Diet recommendation: regular  Filed Weights   08/30/20 0501 09/01/20 0457 09/14/20 1345  Weight: 99.1 kg 96.5 kg 96 kg    History of present illness:  79 home dwell WM atrial fibrillation [place] score >4 on Eliquis, coronary artery disease, history of CVA,, hypertension, perforated bowel in 2010 colostomy status post colostomy reversal in 2011 and small bowel obstruction 2016 that resolved with conservative management who presented with abdominal pain on August 28, 2020--closed loop bowel obstruction.  General surgery consulted and he underwent expiratory laparotomy, extensive lysis evaluation, small bowel resection on 09/02/2020.   CT abdomen/pelvis follow-up done on 09/09/20 showed large heterogeneous collection in the anterior pelvis consistent with hematoma.  IR consulted  and hematoma was aspirated, AC stopped  Hospital Course:  1.Small bowel obstruction  Presented with abdominal pain Patient was found to have closed loop bowel obstruction.  General surgery consulted and he underwent expiratory laparotomy, extensive lysis evaluation, small bowel resection on 09/02/2020. No further TPN--full diet WOC care per surgery--Pain controlled without meds opiates, may use methocarbamol sparingly for spasm Wound VAC has been removed-outpatient follow-up with general surgery as  per their instructions   2.  Hematoma  CT abdomen/pelvis follow-up done on 09/09/20 showed large( 18 x 6.8 cm) heterogeneous collection in the anterior pelvis consistent with hematoma.  IR consulted and he underwent aspiration of the hematoma. Hemoglobin has remained stable restarted on Zosyn again, cx from aspiration has NGTD.Abx stopped.  Denies any abdominal pain today.  3.  Paroxysmal atrial fibrillation rate controlled He was on heparin drip, discontinued after finding of hematoma. Continue atenolol on reduced dose Restarted Eliquis.Currently he is in NSR  4.  Urinary tract infection /LUTS with urinary retention Patient was started on ceftriaxone and he has had 3 doses He was resumed on his flomax--unfortunately he developed Urinary retention and will need voiding trial as OP at skilled facility and may need referral to urology-I discussed this all with his wife  5.  History of CVA  / coronary artery disease On statin, beta-blocker and Eliquis .   Continue atenolol-, amlodipine-titrate as per BP   6.  Hypertension Monitor blood pressure.  Blood pressure stabilized this admit   7.  Leukocytosis resolved   8.  Debility/deconditioning Patient seen by PT/OT and recommended SNF  on discharge.  TOC consulted.  Patient is agreeable   Nutrition Problem: Severe Malnutrition Etiology: acute illness (SBO with no oral intake x 5 days)   Discharge Exam: Vitals:   09/18/20 0354 09/18/20 0953  BP: 107/70 134/74  Pulse: 93 93  Resp: 20 18  Temp: 98.4 F (36.9 C) 98.4 F (36.9 C)  SpO2: 93% 96%    Subj on day of d/c   Coherent asking me if I spoke to his wife no distress   General Exam on discharge  Pleasant coherent no distress EOMI NCAT CTA B no  rales rhonchi Abdomen soft with bandage over incision Neurologically intact  Discharge Instructions   Discharge Instructions     Diet - low sodium heart healthy   Complete by: As directed    Increase activity slowly    Complete by: As directed    No wound care   Complete by: As directed       Allergies as of 09/18/2020       Reactions   Other Other (See Comments)   Some form of numbing medication used by dentist, almost died   Simvastatin Other (See Comments)   Muscle aches        Medication List     STOP taking these medications    lisinopril 40 MG tablet Commonly known as: ZESTRIL       TAKE these medications    acetaminophen 650 MG CR tablet Commonly known as: TYLENOL Take 650 mg by mouth daily as needed for pain.   amLODipine 5 MG tablet Commonly known as: NORVASC TAKE 1 TABLET BY MOUTH EVERY OTHER DAY   apixaban 5 MG Tabs tablet Commonly known as: ELIQUIS Take 1 tablet (5 mg total) by mouth 2 (two) times daily.   atenolol 25 MG tablet Commonly known as: TENORMIN Take 1 tablet (25 mg total) by mouth daily. What changed:  medication strength how much to take   atorvastatin 40 MG tablet Commonly known as: LIPITOR TAKE 1/2 TABLET BY MOUTH DAILY What changed: when to take this   CoQ10 200 MG Caps Take 1 capsule by mouth daily. What changed: how much to take   hydroxypropyl methylcellulose / hypromellose 2.5 % ophthalmic solution Commonly known as: ISOPTO TEARS / GONIOVISC Place 1 drop into both eyes 2 (two) times daily as needed for dry eyes.   MELATONIN PO Take 1 tablet by mouth at bedtime.   methocarbamol 500 MG tablet Commonly known as: ROBAXIN Take 2 tablets (1,000 mg total) by mouth 3 (three) times daily.   nitroGLYCERIN 0.4 MG SL tablet Commonly known as: NITROSTAT Dissolve 1 tablet under the tongue every 5 minutes as needed What changed:  how much to take how to take this when to take this reasons to take this additional instructions   oxymetazoline 0.05 % nasal spray Commonly known as: AFRIN Place 2 sprays into both nostrils at bedtime.   pantoprazole 40 MG tablet Commonly known as: PROTONIX Take 1 tablet (40 mg total) by mouth daily.    tamsulosin 0.4 MG Caps capsule Commonly known as: FLOMAX Take 1 capsule (0.4 mg total) by mouth daily after supper. What changed: when to take this   Vitamin B-12 1000 MCG Subl Place 1 tablet (1,000 mcg total) under the tongue daily.       Allergies  Allergen Reactions   Other Other (See Comments)    Some form of numbing medication used by dentist, almost died   Simvastatin Other (See Comments)    Muscle aches       The results of significant diagnostics from this hospitalization (including imaging, microbiology, ancillary and laboratory) are listed below for reference.    Significant Diagnostic Studies: DG Abd 1 View  Result Date: 08/30/2020 CLINICAL DATA:  80 year old male status post nasogastric tube placement. EXAM: ABDOMEN - 1 VIEW COMPARISON:  08/29/2020. FINDINGS: Nasogastric tube extends into the proximal stomach with side port at or immediately distal to the gastroesophageal junction. Visualized bowel gas pattern is unremarkable. Visualized portions of the lungs suggest a background of interstitial pulmonary edema. Mild cardiomegaly. IMPRESSION:  1. Tip of nasogastric tube is in the proximal stomach and could be advanced approximately 10 cm for more optimal placement. 2. Chest is incompletely imaged, but visualized portions suggests underlying congestive heart failure. Follow-up chest radiograph is recommended. Electronically Signed   By: [name] M.D.   On: 08/30/2020 07:50   CT ABDOMEN PELVIS W CONTRAST  Result Date: 09/09/2020 CLINICAL DATA:  Abdominal pain, fever, post-op leukocytosis, prolonged post op ileus EXAM: CT ABDOMEN AND PELVIS WITH CONTRAST TECHNIQUE: Multidetector CT imaging of the abdomen and pelvis was performed using the standard protocol following bolus administration of intravenous contrast. CONTRAST:  122m OMNIPAQUE IOHEXOL 300 MG/ML  SOLN COMPARISON:  CT 08/28/2020 FINDINGS: Lower chest: Bibasilar atelectasis, small left and trace right  effusions. Mild paraseptal emphysema. Coronary artery calcifications. Prominent left atrium. Hepatobiliary: No focal liver abnormality is seen. Cholelithiasis no evidence of cholecystitis. Pancreas: Unremarkable. No pancreatic ductal dilatation or surrounding inflammatory changes. Spleen: Normal in size without focal abnormality. Adrenals/Urinary Tract: Adrenal glands are unremarkable. No hydronephrosis. Unchanged right renal cyst. The bladder is decompressed with Foley catheter in place in with intraluminal gas. Stomach/Bowel: Small hiatal hernia. The stomach is within normal limits. There is no evidence of bowel obstruction. Evidence of small bowel resection. Oral contrast material is noted throughout the small bowel and within the ascending and transverse colon. There is no evidence of contrast leak the appendix is normal. There is extensive postsurgical inflammatory change in the mid to lower central abdomen and anterior pelvis. In the anterior pelvis, there is a heterogeneous collection, on the right side of this collection there is low-density fluid with a fluid fluid level layering posteriorly with more dense material, and the left anterior pelvis there is a or heterogeneous higher density appearance. This entire area measures approximately 18.0 x 6.8 cm. Vascular/Lymphatic: Aortoiliac atherosclerotic calcifications. No AAA. No lymphadenopathy. Reproductive: Mildly enlarged prostate. Other: Prior midline incision. Musculoskeletal: No acute osseous abnormality. No suspicious lytic or blastic lesions. Multilevel degenerative changes of the spine. IMPRESSION: Large heterogeneous collection in the anterior pelvis measuring 18.0 x 6.8 cm in the axial dimension, favored to represent a hematoma with varying ages of blood products. Given the patient's leukocytosis, an infected hematoma is possible. Recent small-bowel resection with extensive postsurgical inflammatory changes in the central mid abdomen and pelvis. No  evidence of anastomotic leak. No evidence of bowel obstruction. These results were called by telephone at the time of interpretation on 09/09/2020 at 9:11 pm to provider JGershon Cull who verbally acknowledged these results. Electronically Signed   By: JMaurine Simmering  On: 09/09/2020 21:13   CT Abdomen Pelvis W Contrast  Result Date: 08/28/2020 CLINICAL DATA:  Diverticulitis suspected. Sudden onset abdominal pain. EXAM: CT ABDOMEN AND PELVIS WITH CONTRAST TECHNIQUE: Multidetector CT imaging of the abdomen and pelvis was performed using the standard protocol following bolus administration of intravenous contrast. CONTRAST:  1045mOMNIPAQUE IOHEXOL 300 MG/ML  SOLN COMPARISON:  CT abdomen 04/01/14 FINDINGS: Lower chest: Lung bases are clear. Hepatobiliary: No focal hepatic lesion. No biliary duct dilatation. Common bile duct is normal. Pancreas: Pancreas is normal. No ductal dilatation. No pancreatic inflammation. Spleen: Normal spleen Adrenals/urinary tract: Adrenal glands and kidneys normal. Small cortical lesion in the RIGHT kidney is not significant changed from CT 2016. There are multiple diverticula of the bladder along the posterior and anterior wall. Stomach/Bowel: Small hiatal hernia.  Stomach and duodenum normal. A loop of bowel in the LEFT upper quadrant is fluid-filled and has mild inflammatory stranding adjacent  to loops (image 39/2). There is air-fluid level within these loops. The loops are mildly dilated up to 3.5 cm (image 39/2.). The bowel upstream and downstream of this dilated loop are small in caliber and the loop has a folded pattern on sagittal imaging (118/6) which could indicate close loop obstruction anatomy. Downstream small bowel is normal. Appendix normal. Ascending transverse colon normal. There are scattered diverticula of the transverse colon. There is descending colon is collapsed. No diverticulitis of the descending colon. Rectosigmoid colon normal. Vascular/Lymphatic: Abdominal aorta  is normal caliber. No periportal or retroperitoneal adenopathy. No pelvic adenopathy. Reproductive: Other: No free fluid. Musculoskeletal: No aggressive osseous lesion. IMPRESSION: 1. Inflamed loop of small bowel LEFT upper quadrant with potential closed loop anatomy. This could progress to high-grade obstruction. Recommend surgical consultation. 2. No diverticulitis. 3. Multiple bladder diverticula. Electronically Signed   By: Suzy Bouchard M.D.   On: 08/28/2020 21:07   CT ASPIRATION  Result Date: 09/10/2020 INDICATION: 80 year old with postoperative fluid collections in the anterior pelvis. EXAM: CT-GUIDED ASPIRATION OF PELVIC FLUID COLLECTION MEDICATIONS: Moderate sedation ANESTHESIA/SEDATION: Fentanyl 100 mcg IV; Versed 2.0 mg IV Moderate Sedation Time:  12 minutes The patient was continuously monitored during the procedure by the interventional radiology nurse under my direct supervision. COMPLICATIONS: None immediate. PROCEDURE: Informed written consent was obtained from the patient after a thorough discussion of the procedural risks, benefits and alternatives. All questions were addressed. A timeout was performed prior to the initiation of the procedure. Patient was placed supine. CT images through the lower abdomen and pelvis were obtained. Low density collection in the anterior pelvis just right of midline was targeted. The right side of the lower abdomen and pelvis was prepped with chlorhexidine and sterile field was created. Skin was anesthetized with 1% lidocaine. Small incision was made. Using CT guidance, an 18 gauge trocar needle was directed into the anterior right pelvic collection and approximately 90 mL of dark red fluid was removed. Follow up CT images were obtained. Needle was removed. Bandage placed over the puncture site. FINDINGS: Low-density collection in the anterior pelvis at midline and just right of midline. Hyperdense collection along the anterior left side of the pelvis  compatible with a hematoma. Needle was directed into the low-density right-sided collection. 90 mL of bloody fluid was removed. Collection was partially decompressed at the end of the procedure. IMPRESSION: CT-guided aspiration of the anterior pelvic fluid collection. Findings are compatible with liquefied hematoma. Fluid was sent for culture. Electronically Signed   By: Markus Daft M.D.   On: 09/10/2020 15:39   DG CHEST PORT 1 VIEW  Result Date: 09/02/2020 CLINICAL DATA:  PICC placement. EXAM: PORTABLE CHEST 1 VIEW COMPARISON:  Earlier today. FINDINGS: Right upper extremity PICC is been retracted, tip now in the mid SVC. No pneumothorax. Persistent low lung volumes with bibasilar atelectasis and vascular crowding. Enteric tube remains in place. Stable heart size and mediastinal contours. IMPRESSION: 1. Tip of the right upper extremity PICC in the mid SVC. No pneumothorax. 2. Persistent low lung volumes with bibasilar atelectasis and vascular crowding. Electronically Signed   By: Keith Rake M.D.   On: 09/02/2020 23:02   DG CHEST PORT 1 VIEW  Result Date: 09/02/2020 CLINICAL DATA:  PICC line placement EXAM: PORTABLE CHEST 1 VIEW COMPARISON:  04/22/2019 FINDINGS: Lung volumes are small and there is resultant vascular crowding at the hila. Mild bibasilar atelectasis. No pneumothorax or pleural effusion. Right upper extremity PICC line tip is seen within the mid right  atrium, roughly 4 cm distal to the superior cavoatrial junction. Nasogastric tube extends into the left upper quadrant of the abdomen. Cardiac size is within normal limits when accounting for poor pulmonary insufflation. No acute bone abnormality. IMPRESSION: Right upper extremity PICC line tip within the right atrium 4 cm distal to the superior cavoatrial junction. Pulmonary hypoinflation with bibasilar atelectasis. Electronically Signed   By: Fidela Salisbury MD   On: 09/02/2020 21:46   DG Abd Portable 1V  Result Date: 09/06/2020 CLINICAL  DATA:  NG tube placement EXAM: PORTABLE ABDOMEN - 1 VIEW COMPARISON:  09/02/2020 abdominal radiograph FINDINGS: Enteric tube terminates in the proximal stomach. No dilated small bowel loops. Mild colonic gas and stool. No evidence of pneumatosis or pneumoperitoneum. Probable small left pleural effusion with mild left lung base dense opacity. IMPRESSION: Enteric tube terminates in the proximal stomach. Probable small left pleural effusion with dense mild left lung base opacity favoring atelectasis. Electronically Signed   By: Ilona Sorrel M.D.   On: 09/06/2020 09:08   DG Abd Portable 1V  Result Date: 09/02/2020 CLINICAL DATA:  Small-bowel obstruction. EXAM: PORTABLE ABDOMEN - 1 VIEW COMPARISON:  09/01/2020. FINDINGS: Upper abdomen not imaged. Continued progression of prominent small bowel distention with a paucity of colonic gas. Findings again consistent with persistent small-bowel obstruction. No free air identified. Degenerative changes lumbar spine and both hips. IMPRESSION: Continued progression of prominent small bowel distention. Findings consistent with persistent small-bowel obstruction. Electronically Signed   By: Marcello Moores  Register   On: 09/02/2020 08:50   DG Abd Portable 1V  Result Date: 09/01/2020 CLINICAL DATA:  Small-bowel obstruction. EXAM: PORTABLE ABDOMEN - 1 VIEW COMPARISON:  08/31/2020.  08/30/2020.  CT 08/28/2020. FINDINGS: NG tube noted with tip in stable position over the upper stomach. Progressive small-bowel distention. Small-bowel dilatation up to 5.7 cm noted. Colon is nondistended. No free air noted. No acute bony abnormality. IMPRESSION: 1. NG tube noted with tip in stable position over the upper stomach. 2. Progressive small-bowel distention. Findings again consistent with small bowel obstruction. Electronically Signed   By: Marcello Moores  Register   On: 09/01/2020 06:21   DG Abd Portable 1V  Result Date: 08/31/2020 CLINICAL DATA:  Nasogastric tube placement EXAM: PORTABLE ABDOMEN - 1  VIEW COMPARISON:  None. FINDINGS: Nasogastric tube tip within the mid body of the stomach. Visualized abdominal gas pattern is unremarkable. No gross free intraperitoneal gas. Right flank and pelvis excluded from view. IMPRESSION: Nasogastric tube tip within the mid body of the stomach. Electronically Signed   By: Fidela Salisbury MD   On: 08/31/2020 17:55   DG Abd Portable 1V  Result Date: 08/31/2020 CLINICAL DATA:  Small bowel obstruction. EXAM: PORTABLE ABDOMEN - 1 VIEW COMPARISON:  08/30/2020 FINDINGS: An enteric tube remains looped in the region of the gastric fundus. There are a few mildly dilated gas-filled loops of small bowel in the left mid abdomen which are more conspicuous than on the prior study. Gas is present in nondilated colon. Heterogeneous bibasilar pulmonary airspace opacities are suboptimally evaluated due to overpenetration. IMPRESSION: Mildly dilated small bowel loops in the left mid abdomen which could reflect partial obstruction. Electronically Signed   By: Logan Bores M.D.   On: 08/31/2020 08:45   DG Abd Portable 1V  Result Date: 08/30/2020 CLINICAL DATA:  Nasogastric tube replacement EXAM: PORTABLE ABDOMEN - 1 VIEW COMPARISON:  Earlier film of the same day FINDINGS: Nasogastric tube loops in the decompressed gastric fundus. A few gas filled mid abdominal small bowel  loops. The right lateral and inferior abdomen are excluded. IMPRESSION: Nasogastric tube loops in the gastric fundus. Electronically Signed   By: Lucrezia Europe M.D.   On: 08/30/2020 13:42   DG Abd Portable 1V  Result Date: 08/30/2020 CLINICAL DATA:  Small-bowel obstruction EXAM: PORTABLE ABDOMEN - 1 VIEW COMPARISON:  08/29/2020, 08/28/2020 FINDINGS: Enteric tube terminates within the gastric body. Abnormally dilated loop of small bowel within the left hemiabdomen measuring up to 4.5 cm in diameter, which appears to be thickened. Faint dilute contrast within small bowel, but not definitively seen within the colon.  Excreted contrast within the urinary bladder. No gross pneumoperitoneum. IMPRESSION: Abnormally dilated loop of small bowel within the left hemiabdomen, which appears to be thickened. Faint dilute contrast within small bowel, but not definitively seen within the colon. Findings concerning for worsening small bowel obstruction, possibly closed loop given appearance on previous CT. Electronically Signed   By: Davina Poke D.O.   On: 08/30/2020 10:23   DG Abd Portable 1V-Small Bowel Obstruction Protocol-initial, 8 hr delay  Result Date: 08/29/2020 CLINICAL DATA:  Small-bowel obstruction EXAM: PORTABLE ABDOMEN - 1 VIEW COMPARISON:  08/29/2020 FINDINGS: Three supine frontal views of the abdomen and pelvis are obtained after the administration of Gastroview approximately 10 hours prior to imaging. Enteric catheter is seen within the gastric lumen. There is significant retention oral contrast within the stomach. Slow diffusion of oral contrast is seen filling dilated loops of small bowel within the lower central abdomen and pelvis. No evidence of transit of contrast into the colon at the time of imaging. Excreted contrast is seen within the urinary bladder. IMPRESSION: 1. Dilute oral contrast filling multiple dilated loops of small bowel throughout the lower abdomen and pelvis, compatible with small-bowel obstruction. No contrast identified within the colon at the time of imaging. 2. Significant retention of oral contrast within the stomach, with indwelling enteric catheter as above. Electronically Signed   By: Randa Ngo M.D.   On: 08/29/2020 15:49   DG Abd Portable 1V  Result Date: 08/29/2020 CLINICAL DATA:  80 year old male NG tube placement. EXAM: PORTABLE ABDOMEN - 1 VIEW COMPARISON:  CT Abdomen and Pelvis 08/28/2020. FINDINGS: Portable AP supine view at 0411 hours. Enteric tube placed into the stomach. Side hole is at the level of the gastric body. Stable visible bowel gas pattern from the recent CT.  No acute cardiopulmonary abnormality identified. IMPRESSION: NG tube placed into the stomach, side hole at the gastric body. Electronically Signed   By: Genevie Ann M.D.   On: 08/29/2020 04:29   [place] EKG SITE RITE  Result Date: 09/02/2020 If Site Rite image not attached, placement could not be confirmed due to current cardiac rhythm.   Microbiology: Recent Results (from the past 240 hour(s))  Aerobic/Anaerobic Culture w Gram Stain (surgical/deep wound)     Status: None   Collection Time: 09/10/20  1:16 PM   Specimen: Abdomen; Abdominal Fluid  Result Value Ref Range Status   Specimen Description ABDOMEN FLUID  Final   Special Requests ABDOMEN  Final   Gram Stain   Final    ABUNDANT WBC PRESENT, PREDOMINANTLY PMN NO ORGANISMS SEEN    Culture   Final    No growth aerobically or anaerobically. Performed at [facility], [place] [address] [place], [place] [address]    Report Status 09/15/2020 FINAL  Final  SARS CORONAVIRUS 2 (TAT 6-24 HRS) Nasopharyngeal Nasopharyngeal Swab     Status: None   Collection Time: 09/17/20 12:01  PM   Specimen: Nasopharyngeal Swab  Result Value Ref Range Status   SARS Coronavirus 2 NEGATIVE NEGATIVE Final    Comment: (NOTE) SARS-CoV-2 target nucleic acids are NOT DETECTED.  The SARS-CoV-2 RNA is generally detectable in upper and lower respiratory specimens during the acute phase of infection. Negative results do not preclude SARS-CoV-2 infection, do not rule out co-infections with other pathogens, and should not be used as the sole basis for treatment or other patient management decisions. Negative results must be combined with clinical observations, patient history, and epidemiological information. The expected result is Negative.  Fact Sheet for Patients: SugarRoll.be  Fact Sheet for Healthcare Providers: https://www.woods-mathews.com/  This test is not yet approved or cleared by the [place] FDA and   has been authorized for detection and/or diagnosis of SARS-CoV-2 by FDA under an Emergency Use Authorization (EUA). This EUA will remain  in effect (meaning this test can be used) for the duration of the COVID-19 declaration under Se ction 564(b)(1) of the Act, 21 U.S.C. section 360bbb-3(b)(1), unless the authorization is terminated or revoked sooner.  Performed at [facility], [place] [address] [place], [place] [address]      Labs: Basic Metabolic Panel: Recent Labs  Lab 09/13/20 0021 09/14/20 0353 09/15/20 0334 09/17/20 1245  NA 133* 135 136 136  K 4.4 4.4 4.3 3.9  CL 105 106 105 104  CO2 '23 23 25 22  '$ GLUCOSE 120* 116* 122* 133*  BUN 24* 23 24* 30*  CREATININE 0.78 0.79 0.71 1.14  CALCIUM 8.6* 8.7* 8.6* 9.4  MG 2.3 2.1 2.0  --   PHOS 3.3 3.3 2.7  --     Liver Function Tests: Recent Labs  Lab 09/14/20 0353 09/17/20 1245  AST 107* 74*  ALT 145* 108*  ALKPHOS 137* 123  BILITOT 1.1 1.4*  PROT 5.5* 6.1*  ALBUMIN 1.8* 2.0*    No results for input(s): LIPASE, AMYLASE in the last 168 hours. No results for input(s): AMMONIA in the last 168 hours. CBC: Recent Labs  Lab 09/12/20 0327 09/13/20 0021 09/14/20 0353 09/15/20 0334  WBC 15.9* 14.3* 12.5* 10.1  NEUTROABS 11.2* 10.3* 8.9*  --   HGB 9.0* 9.1* 9.4* 9.1*  HCT 28.8* 28.4* 29.5* 29.2*  MCV 94.4 92.2 94.6 94.8  PLT 314 365 384 394    Cardiac Enzymes: No results for input(s): CKTOTAL, CKMB, CKMBINDEX, TROPONINI in the last 168 hours. BNP: BNP (last 3 results) No results for input(s): BNP in the last 8760 hours.  ProBNP (last 3 results) No results for input(s): PROBNP in the last 8760 hours.  CBG: Recent Labs  Lab 09/14/20 0431 09/14/20 0831 09/14/20 1144 09/14/20 1558 09/14/20 2026  GLUCAP 107* 123* 135* 117* 119*        Signed:  Nita Sells MD   [facility] 09/18/2020, 10:05 AM

## 2020-09-21 DIAGNOSIS — K56609 Unspecified intestinal obstruction, unspecified as to partial versus complete obstruction: Secondary | ICD-10-CM | POA: Diagnosis not present

## 2020-09-21 DIAGNOSIS — M25511 Pain in right shoulder: Secondary | ICD-10-CM | POA: Diagnosis not present

## 2020-09-23 DIAGNOSIS — I2510 Atherosclerotic heart disease of native coronary artery without angina pectoris: Secondary | ICD-10-CM | POA: Diagnosis not present

## 2020-09-23 DIAGNOSIS — R339 Retention of urine, unspecified: Secondary | ICD-10-CM | POA: Diagnosis not present

## 2020-09-23 DIAGNOSIS — I639 Cerebral infarction, unspecified: Secondary | ICD-10-CM | POA: Diagnosis not present

## 2020-09-23 DIAGNOSIS — E119 Type 2 diabetes mellitus without complications: Secondary | ICD-10-CM | POA: Diagnosis not present

## 2020-09-23 DIAGNOSIS — T148XXA Other injury of unspecified body region, initial encounter: Secondary | ICD-10-CM | POA: Diagnosis not present

## 2020-09-23 DIAGNOSIS — K56609 Unspecified intestinal obstruction, unspecified as to partial versus complete obstruction: Secondary | ICD-10-CM | POA: Diagnosis not present

## 2020-09-23 DIAGNOSIS — I480 Paroxysmal atrial fibrillation: Secondary | ICD-10-CM | POA: Diagnosis not present

## 2020-09-23 DIAGNOSIS — I10 Essential (primary) hypertension: Secondary | ICD-10-CM | POA: Diagnosis not present

## 2020-09-23 DIAGNOSIS — N390 Urinary tract infection, site not specified: Secondary | ICD-10-CM | POA: Diagnosis not present

## 2020-09-24 ENCOUNTER — Telehealth: Payer: Self-pay

## 2020-09-24 NOTE — Telephone Encounter (Signed)
Received a call from [place] at [facility], they are wanting the patient to follow up within 10-14 days. No openings can use two virtual slots (40 min) next Friday for patient to come in for a follow up?

## 2020-09-25 NOTE — Telephone Encounter (Signed)
[name] Moulds called back from [facility] and stated that William Norman is getting discharged today. She stated that it is best to call him once we know something from Dr [name].

## 2020-09-25 NOTE — Telephone Encounter (Signed)
See below

## 2020-09-26 DIAGNOSIS — R339 Retention of urine, unspecified: Secondary | ICD-10-CM | POA: Diagnosis not present

## 2020-09-26 DIAGNOSIS — I872 Venous insufficiency (chronic) (peripheral): Secondary | ICD-10-CM | POA: Diagnosis not present

## 2020-09-26 DIAGNOSIS — K219 Gastro-esophageal reflux disease without esophagitis: Secondary | ICD-10-CM | POA: Diagnosis not present

## 2020-09-26 DIAGNOSIS — Z48815 Encounter for surgical aftercare following surgery on the digestive system: Secondary | ICD-10-CM | POA: Diagnosis not present

## 2020-09-26 DIAGNOSIS — R351 Nocturia: Secondary | ICD-10-CM | POA: Diagnosis not present

## 2020-09-26 DIAGNOSIS — M6281 Muscle weakness (generalized): Secondary | ICD-10-CM | POA: Diagnosis not present

## 2020-09-26 DIAGNOSIS — I480 Paroxysmal atrial fibrillation: Secondary | ICD-10-CM | POA: Diagnosis not present

## 2020-09-26 DIAGNOSIS — T8189XA Other complications of procedures, not elsewhere classified, initial encounter: Secondary | ICD-10-CM | POA: Diagnosis not present

## 2020-09-26 DIAGNOSIS — I10 Essential (primary) hypertension: Secondary | ICD-10-CM | POA: Diagnosis not present

## 2020-09-26 DIAGNOSIS — N401 Enlarged prostate with lower urinary tract symptoms: Secondary | ICD-10-CM | POA: Diagnosis not present

## 2020-09-26 DIAGNOSIS — R338 Other retention of urine: Secondary | ICD-10-CM | POA: Diagnosis not present

## 2020-09-26 DIAGNOSIS — I2510 Atherosclerotic heart disease of native coronary artery without angina pectoris: Secondary | ICD-10-CM | POA: Diagnosis not present

## 2020-09-26 DIAGNOSIS — K56699 Other intestinal obstruction unspecified as to partial versus complete obstruction: Secondary | ICD-10-CM | POA: Diagnosis not present

## 2020-09-27 DIAGNOSIS — N401 Enlarged prostate with lower urinary tract symptoms: Secondary | ICD-10-CM | POA: Diagnosis not present

## 2020-09-27 DIAGNOSIS — R351 Nocturia: Secondary | ICD-10-CM | POA: Diagnosis not present

## 2020-09-27 DIAGNOSIS — R339 Retention of urine, unspecified: Secondary | ICD-10-CM | POA: Diagnosis not present

## 2020-09-27 DIAGNOSIS — I872 Venous insufficiency (chronic) (peripheral): Secondary | ICD-10-CM | POA: Diagnosis not present

## 2020-09-27 DIAGNOSIS — I10 Essential (primary) hypertension: Secondary | ICD-10-CM | POA: Diagnosis not present

## 2020-09-27 DIAGNOSIS — K219 Gastro-esophageal reflux disease without esophagitis: Secondary | ICD-10-CM | POA: Diagnosis not present

## 2020-09-27 DIAGNOSIS — I480 Paroxysmal atrial fibrillation: Secondary | ICD-10-CM | POA: Diagnosis not present

## 2020-09-27 DIAGNOSIS — Z48815 Encounter for surgical aftercare following surgery on the digestive system: Secondary | ICD-10-CM | POA: Diagnosis not present

## 2020-09-27 DIAGNOSIS — I2510 Atherosclerotic heart disease of native coronary artery without angina pectoris: Secondary | ICD-10-CM | POA: Diagnosis not present

## 2020-09-27 DIAGNOSIS — R338 Other retention of urine: Secondary | ICD-10-CM | POA: Diagnosis not present

## 2020-09-28 ENCOUNTER — Telehealth: Payer: Self-pay

## 2020-09-28 NOTE — Telephone Encounter (Signed)
Ok with me 

## 2020-09-28 NOTE — Telephone Encounter (Signed)
See below

## 2020-09-28 NOTE — Telephone Encounter (Signed)
Patient wife calling stating the patient was in the [facility] for 3 weeks and went to a nursing home for a few weeks. Patient was prescribed methocarbamol (ROBAXIN) 500 MG tablet but isnt sure if he is suppose to still be taking it. Please advise.

## 2020-09-28 NOTE — Telephone Encounter (Signed)
Yes, that is fine. 

## 2020-09-28 NOTE — Telephone Encounter (Signed)
It is ok for him to stop it.  [name]. [name], MD 09/28/2020 3:19 PM

## 2020-09-28 NOTE — Telephone Encounter (Signed)
Patient is scheduled   

## 2020-09-28 NOTE — Telephone Encounter (Signed)
Returned pt wife call and gave message below.

## 2020-09-28 NOTE — Telephone Encounter (Signed)
Patient's wife is calling in stating that [name] is needing a hsp follow up, no appointments available - he has been having pain down his neck that travels down his spine. Can we use the two virtual slots on Friday to work patient in that afternoon for a 40 minute appt?

## 2020-09-29 DIAGNOSIS — R339 Retention of urine, unspecified: Secondary | ICD-10-CM | POA: Diagnosis not present

## 2020-09-29 DIAGNOSIS — Z48815 Encounter for surgical aftercare following surgery on the digestive system: Secondary | ICD-10-CM | POA: Diagnosis not present

## 2020-09-29 DIAGNOSIS — N401 Enlarged prostate with lower urinary tract symptoms: Secondary | ICD-10-CM | POA: Diagnosis not present

## 2020-09-29 DIAGNOSIS — I872 Venous insufficiency (chronic) (peripheral): Secondary | ICD-10-CM | POA: Diagnosis not present

## 2020-09-29 DIAGNOSIS — I10 Essential (primary) hypertension: Secondary | ICD-10-CM | POA: Diagnosis not present

## 2020-09-29 DIAGNOSIS — I480 Paroxysmal atrial fibrillation: Secondary | ICD-10-CM | POA: Diagnosis not present

## 2020-09-29 DIAGNOSIS — R338 Other retention of urine: Secondary | ICD-10-CM | POA: Diagnosis not present

## 2020-09-29 DIAGNOSIS — I2510 Atherosclerotic heart disease of native coronary artery without angina pectoris: Secondary | ICD-10-CM | POA: Diagnosis not present

## 2020-09-29 DIAGNOSIS — R351 Nocturia: Secondary | ICD-10-CM | POA: Diagnosis not present

## 2020-09-29 DIAGNOSIS — K219 Gastro-esophageal reflux disease without esophagitis: Secondary | ICD-10-CM | POA: Diagnosis not present

## 2020-09-30 ENCOUNTER — Telehealth: Payer: Self-pay | Admitting: *Deleted

## 2020-09-30 NOTE — Telephone Encounter (Signed)
William Norman form Home health call requesting VO for  Home Health visit to move to next week  VO given

## 2020-09-30 NOTE — Telephone Encounter (Signed)
Ok with me. Please place any necessary orders. 

## 2020-10-01 DIAGNOSIS — R338 Other retention of urine: Secondary | ICD-10-CM | POA: Diagnosis not present

## 2020-10-01 DIAGNOSIS — I10 Essential (primary) hypertension: Secondary | ICD-10-CM | POA: Diagnosis not present

## 2020-10-01 DIAGNOSIS — N401 Enlarged prostate with lower urinary tract symptoms: Secondary | ICD-10-CM | POA: Diagnosis not present

## 2020-10-01 DIAGNOSIS — K219 Gastro-esophageal reflux disease without esophagitis: Secondary | ICD-10-CM | POA: Diagnosis not present

## 2020-10-01 DIAGNOSIS — I872 Venous insufficiency (chronic) (peripheral): Secondary | ICD-10-CM | POA: Diagnosis not present

## 2020-10-01 DIAGNOSIS — I480 Paroxysmal atrial fibrillation: Secondary | ICD-10-CM | POA: Diagnosis not present

## 2020-10-01 DIAGNOSIS — R339 Retention of urine, unspecified: Secondary | ICD-10-CM | POA: Diagnosis not present

## 2020-10-01 DIAGNOSIS — Z48815 Encounter for surgical aftercare following surgery on the digestive system: Secondary | ICD-10-CM | POA: Diagnosis not present

## 2020-10-01 DIAGNOSIS — I2510 Atherosclerotic heart disease of native coronary artery without angina pectoris: Secondary | ICD-10-CM | POA: Diagnosis not present

## 2020-10-01 DIAGNOSIS — R351 Nocturia: Secondary | ICD-10-CM | POA: Diagnosis not present

## 2020-10-02 ENCOUNTER — Ambulatory Visit (INDEPENDENT_AMBULATORY_CARE_PROVIDER_SITE_OTHER): Payer: Medicare HMO | Admitting: Family Medicine

## 2020-10-02 ENCOUNTER — Other Ambulatory Visit: Payer: Self-pay

## 2020-10-02 ENCOUNTER — Encounter: Payer: Self-pay | Admitting: Family Medicine

## 2020-10-02 VITALS — BP 96/61 | HR 89 | Temp 98.3°F | Ht 72.0 in | Wt 196.0 lb

## 2020-10-02 DIAGNOSIS — K5900 Constipation, unspecified: Secondary | ICD-10-CM | POA: Diagnosis not present

## 2020-10-02 DIAGNOSIS — R739 Hyperglycemia, unspecified: Secondary | ICD-10-CM | POA: Diagnosis not present

## 2020-10-02 DIAGNOSIS — K56609 Unspecified intestinal obstruction, unspecified as to partial versus complete obstruction: Secondary | ICD-10-CM

## 2020-10-02 DIAGNOSIS — I4819 Other persistent atrial fibrillation: Secondary | ICD-10-CM

## 2020-10-02 DIAGNOSIS — N401 Enlarged prostate with lower urinary tract symptoms: Secondary | ICD-10-CM | POA: Diagnosis not present

## 2020-10-02 DIAGNOSIS — I10 Essential (primary) hypertension: Secondary | ICD-10-CM | POA: Diagnosis not present

## 2020-10-02 DIAGNOSIS — R351 Nocturia: Secondary | ICD-10-CM

## 2020-10-02 NOTE — Assessment & Plan Note (Signed)
Patient still with catheter in place.  Likely due to recent surgery with anesthesia.  He will be following up with urology in a week.

## 2020-10-02 NOTE — Assessment & Plan Note (Signed)
Rate controlled.  Anticoagulated on Eliquis.  We will check labs today.

## 2020-10-02 NOTE — Assessment & Plan Note (Signed)
Lower side today.  Is having some dizziness.  We will stop amlodipine.  Continue atenolol 25 mg daily.  They will continue monitoring at home and check with me in a few weeks via MyChart.  Follow-up in 3 to 6 months at next office visit.

## 2020-10-02 NOTE — Assessment & Plan Note (Signed)
Discussed bowel regimen.  Recommended MiraLAX daily to have at least 1 soft abdomen daily.

## 2020-10-02 NOTE — Assessment & Plan Note (Signed)
Thankfully symptoms have resolved.  His wound appears to be healing normally.

## 2020-10-02 NOTE — Progress Notes (Signed)
William Norman is a 80 y.o. male who presents today for an office visit.  Assessment/Plan:  New/Acute Problems: Elevated LFTs Incidentally found while in the hospital.  We will check c-Met today.  Anemia Did have a drop in hemoglobin post surgery.  Recheck c-Met today.  Chronic Problems Addressed Today: Essential hypertension Lower side today.  Is having some dizziness.  We will stop amlodipine.  Continue atenolol 25 mg daily.  They will continue monitoring at home and check with me in a few weeks via MyChart.  Follow-up in 3 to 6 months at next office visit.  BPH associated with nocturia Patient still with catheter in place.  Likely due to recent surgery with anesthesia.  He will be following up with urology in a week.  Constipation Discussed bowel regimen.  Recommended MiraLAX daily to have at least 1 soft abdomen daily.  SBO (small bowel obstruction) (HCC) Thankfully symptoms have resolved.  His wound appears to be healing normally.  Hyperglycemia Check glucose on c-Met.  Persistent atrial fibrillation (HCC) Rate controlled.  Anticoagulated on Eliquis.  We will check labs today.     Subjective:  HPI:  Patient here for hospital follow-up.  Was noted from 7/15 through 8/5 with SBO.  Underwent exploratory laparotomy with lysis of adhesions as well as small bowel resection.  Postoperative course was complicated by intrapelvic hematoma.  IR consulted with aspiration.  He had wound VAC in place during his hospital.  He was discharged to SNF.  He has been seeing wound care at home.  Wife has been managing his wound.  They have several additional concerns they would like to discuss today.  Still has some ongoing issues with constipation.  Occasionally has hard stools.  Still has some small amount of abdominal pain however this seems to be improving.  Wife is also concerned about his home blood pressure readings.  They have been on the lower side at home.  Occasionally has  dizziness.  No syncope or falls.  Occasionally has shakiness.   His wife is concerned that the methocarbanol tablets, along with the quantity of tablets prescribed (4), that he was given in the hospital could also be contributing to the symptoms. Moreover, after the surgery he had lost his taste as well as producing significant nausea. His sense of taste has returned but food does not taste like he remembers it, and nausea is still present.  Additionally, sometimes when in the bathroom in front of the mirror he will experience a pain starting in his neck which travels down his spine.   He was wondering on usage risks of the catheter, because he appreciates using it and was skeptical about removal.  The surgery suture area seems to be healing well; the bandaging/wet gauze is changed routinely, and as needed.         Objective:  Physical Exam: BP 96/61   Pulse 89   Temp 98.3 F (36.8 C) (Temporal)   Ht 6' (1.829 m)   Wt 196 lb (88.9 kg)   SpO2 96%   BMI 26.58 kg/m   Gen: No acute distress, resting comfortably CV: Regular rate and rhythm with no murmurs appreciated Pulm: Normal work of breathing, clear to auscultation bilaterally with no crackles, wheezes, or rhonchi GI: Midline wound with wet-to-dry dressing in place.  Appears to be healing normally.  Healthy granulation tissue present. Neuro: Grossly normal, moves all extremities Psych: Normal affect and thought content      Time Spent: 50 minutes  of total time was spent on the date of the encounter performing the following actions: chart review prior to seeing the patient including recent hospitalization, obtaining history, performing a medically necessary exam, counseling on the treatment plan, placing orders, and documenting in our EHR.    [name]. [name], MD 10/02/2020 4:28 PM

## 2020-10-02 NOTE — Assessment & Plan Note (Signed)
Check glucose on c-Met.

## 2020-10-03 LAB — CBC WITH DIFFERENTIAL/PLATELET
Absolute Monocytes: 895 cells/uL (ref 200–950)
Basophils Absolute: 99 cells/uL (ref 0–200)
Basophils Relative: 1.4 %
Eosinophils Absolute: 43 cells/uL (ref 15–500)
Eosinophils Relative: 0.6 %
HCT: 37 % — ABNORMAL LOW (ref 38.5–50.0)
Hemoglobin: 11.6 g/dL — ABNORMAL LOW (ref 13.2–17.1)
Lymphs Abs: 1761 cells/uL (ref 850–3900)
MCH: 28 pg (ref 27.0–33.0)
MCHC: 31.4 g/dL — ABNORMAL LOW (ref 32.0–36.0)
MCV: 89.2 fL (ref 80.0–100.0)
MPV: 10.6 fL (ref 7.5–12.5)
Monocytes Relative: 12.6 %
Neutro Abs: 4303 cells/uL (ref 1500–7800)
Neutrophils Relative %: 60.6 %
Platelets: 397 10*3/uL (ref 140–400)
RBC: 4.15 10*6/uL — ABNORMAL LOW (ref 4.20–5.80)
RDW: 13.5 % (ref 11.0–15.0)
Total Lymphocyte: 24.8 %
WBC: 7.1 10*3/uL (ref 3.8–10.8)

## 2020-10-03 LAB — COMPREHENSIVE METABOLIC PANEL
AG Ratio: 1 (calc) (ref 1.0–2.5)
ALT: 79 U/L — ABNORMAL HIGH (ref 9–46)
AST: 91 U/L — ABNORMAL HIGH (ref 10–35)
Albumin: 3.2 g/dL — ABNORMAL LOW (ref 3.6–5.1)
Alkaline phosphatase (APISO): 84 U/L (ref 35–144)
BUN: 15 mg/dL (ref 7–25)
CO2: 25 mmol/L (ref 20–32)
Calcium: 9.6 mg/dL (ref 8.6–10.3)
Chloride: 105 mmol/L (ref 98–110)
Creat: 0.87 mg/dL (ref 0.70–1.22)
Globulin: 3.3 g/dL (calc) (ref 1.9–3.7)
Glucose, Bld: 114 mg/dL — ABNORMAL HIGH (ref 65–99)
Potassium: 4.6 mmol/L (ref 3.5–5.3)
Sodium: 140 mmol/L (ref 135–146)
Total Bilirubin: 0.7 mg/dL (ref 0.2–1.2)
Total Protein: 6.5 g/dL (ref 6.1–8.1)

## 2020-10-03 LAB — SPECIMEN COMPROMISED

## 2020-10-05 DIAGNOSIS — I872 Venous insufficiency (chronic) (peripheral): Secondary | ICD-10-CM | POA: Diagnosis not present

## 2020-10-05 DIAGNOSIS — I480 Paroxysmal atrial fibrillation: Secondary | ICD-10-CM | POA: Diagnosis not present

## 2020-10-05 DIAGNOSIS — R351 Nocturia: Secondary | ICD-10-CM | POA: Diagnosis not present

## 2020-10-05 DIAGNOSIS — K219 Gastro-esophageal reflux disease without esophagitis: Secondary | ICD-10-CM | POA: Diagnosis not present

## 2020-10-05 DIAGNOSIS — I10 Essential (primary) hypertension: Secondary | ICD-10-CM | POA: Diagnosis not present

## 2020-10-05 DIAGNOSIS — Z48815 Encounter for surgical aftercare following surgery on the digestive system: Secondary | ICD-10-CM | POA: Diagnosis not present

## 2020-10-05 DIAGNOSIS — R339 Retention of urine, unspecified: Secondary | ICD-10-CM | POA: Diagnosis not present

## 2020-10-05 DIAGNOSIS — I2510 Atherosclerotic heart disease of native coronary artery without angina pectoris: Secondary | ICD-10-CM | POA: Diagnosis not present

## 2020-10-05 DIAGNOSIS — N401 Enlarged prostate with lower urinary tract symptoms: Secondary | ICD-10-CM | POA: Diagnosis not present

## 2020-10-05 DIAGNOSIS — R338 Other retention of urine: Secondary | ICD-10-CM | POA: Diagnosis not present

## 2020-10-06 ENCOUNTER — Other Ambulatory Visit: Payer: Self-pay

## 2020-10-06 DIAGNOSIS — R339 Retention of urine, unspecified: Secondary | ICD-10-CM | POA: Diagnosis not present

## 2020-10-06 DIAGNOSIS — Z48815 Encounter for surgical aftercare following surgery on the digestive system: Secondary | ICD-10-CM | POA: Diagnosis not present

## 2020-10-06 DIAGNOSIS — I872 Venous insufficiency (chronic) (peripheral): Secondary | ICD-10-CM | POA: Diagnosis not present

## 2020-10-06 DIAGNOSIS — I10 Essential (primary) hypertension: Secondary | ICD-10-CM | POA: Diagnosis not present

## 2020-10-06 DIAGNOSIS — K219 Gastro-esophageal reflux disease without esophagitis: Secondary | ICD-10-CM | POA: Diagnosis not present

## 2020-10-06 DIAGNOSIS — R338 Other retention of urine: Secondary | ICD-10-CM | POA: Diagnosis not present

## 2020-10-06 DIAGNOSIS — R351 Nocturia: Secondary | ICD-10-CM | POA: Diagnosis not present

## 2020-10-06 DIAGNOSIS — I2510 Atherosclerotic heart disease of native coronary artery without angina pectoris: Secondary | ICD-10-CM | POA: Diagnosis not present

## 2020-10-06 DIAGNOSIS — N401 Enlarged prostate with lower urinary tract symptoms: Secondary | ICD-10-CM | POA: Diagnosis not present

## 2020-10-06 DIAGNOSIS — I480 Paroxysmal atrial fibrillation: Secondary | ICD-10-CM | POA: Diagnosis not present

## 2020-10-06 DIAGNOSIS — R739 Hyperglycemia, unspecified: Secondary | ICD-10-CM

## 2020-10-06 NOTE — Progress Notes (Signed)
Please inform patient of the following:  His numbers are improving but not quite yet back to baseline.  Please have him come back in 3 to 4 weeks to recheck CBC and c-Met.  I would like for him to let us know if his symptoms do not continue to improve.

## 2020-10-07 ENCOUNTER — Telehealth: Payer: Self-pay

## 2020-10-07 ENCOUNTER — Other Ambulatory Visit: Payer: Self-pay

## 2020-10-07 ENCOUNTER — Ambulatory Visit (INDEPENDENT_AMBULATORY_CARE_PROVIDER_SITE_OTHER): Payer: Medicare HMO | Admitting: Family Medicine

## 2020-10-07 VITALS — BP 90/50 | HR 105 | Temp 98.0°F | Wt 195.2 lb

## 2020-10-07 DIAGNOSIS — I959 Hypotension, unspecified: Secondary | ICD-10-CM | POA: Diagnosis not present

## 2020-10-07 DIAGNOSIS — R42 Dizziness and giddiness: Secondary | ICD-10-CM

## 2020-10-07 DIAGNOSIS — R8290 Unspecified abnormal findings in urine: Secondary | ICD-10-CM | POA: Diagnosis not present

## 2020-10-07 DIAGNOSIS — N138 Other obstructive and reflux uropathy: Secondary | ICD-10-CM | POA: Diagnosis not present

## 2020-10-07 DIAGNOSIS — N401 Enlarged prostate with lower urinary tract symptoms: Secondary | ICD-10-CM | POA: Diagnosis not present

## 2020-10-07 DIAGNOSIS — R339 Retention of urine, unspecified: Secondary | ICD-10-CM | POA: Diagnosis not present

## 2020-10-07 NOTE — Progress Notes (Signed)
Established Patient Office Visit  Subjective:  Patient ID: William Norman, male    DOB: Aug 01, 1940  Age: 80 y.o. MRN: [id number]  CC:  Chief Complaint  Patient presents with   Hypotension    HPI Derel Bisignano presents for low blood pressure.  Patient had recent surgery in July for small bowel obstruction.  He had some postoperative anemia and had recent hemoglobin 5 days ago which had improved to 11.6.  He had seen his primary provider at that time and amlodipine was discontinued.  He continues to have lightheadedness with standing.  No syncope.  Some generalized weakness.  Is trying to drink more fluids.  Not clear exactly how much he is consuming per day.  Poor appetite since surgery and somewhat poor intake.  Other medications include Eliquis, low-dose atenolol 25 mg daily, atorvastatin, and tamsulosin 0.4 mg.  He has history of BPH and actually has had Foley catheter since his surgery with plans to leave this in with possible eventual plans for TURP.  Past Medical History:  Diagnosis Date   BPH (benign prostatic hypertrophy)    BPH associated with nocturia 08/29/2013   BPH with obstruction/lower urinary tract symptoms 08/14/2019   CAD (coronary artery disease)    CAD (coronary artery disease), native coronary artery    PTCA of RCA 1986, PTCA of LAD 1992,  Negative treadmill Cardiolite 2011    Chronic dermatitis of hands 10/12/2015   Chronic venous insufficiency    Coagulation disorder ([place]) 10/26/2018   DVT (deep venous thrombosis) (HCC)    Dyslipidemia    Eczema    Erectile dysfunction 07/11/2012   Essential hypertension    GERD 08/04/2006       GERD (gastroesophageal reflux disease)    Hyperglycemia 07/08/2019   Hyperlipidemia    Hypertension    Incomplete emptying of bladder 08/14/2019   Long term current use of anticoagulant therapy    Lumbar disc disease    Overweight (BMI 25.0-29.9) 06/18/2015   Persistent atrial fibrillation ([place]) 04/10/2018    Personal history of DVT (deep vein thrombosis)    Initially in 1998 with recurrence in 2002 now on chronic warfarin    Soft tissue lesion of foot 08/27/2015   Stroke [facility])    Urinary tract infection with hematuria 08/14/2019    Past Surgical History:  Procedure Laterality Date   APPLICATION OF WOUND VAC  09/02/2020   Procedure: APPLICATION OF WOUND VAC;  Surgeon: Donnie Mesa, MD;  Location: [place];  Service: General;;   CARDIAC CATHETERIZATION     CARPAL TUNNEL RELEASE  04/21/2011   Procedure: CARPAL TUNNEL RELEASE;  Surgeon: Tennis Must, MD;  Location: [place];  Service: Orthopedics;  Laterality: Left;   CARPAL TUNNEL RELEASE  05/23/2011   Procedure: CARPAL TUNNEL RELEASE;  Surgeon: Tennis Must, MD;  Location: [place];  Service: Orthopedics;  Laterality: Right;   COLON SURGERY  2010 and 2011 colostomy reversal   CORONARY ANGIOPLASTY     EYE SURGERY     repair of macular hole   LAPAROTOMY N/A 09/02/2020   Procedure: EXPLORATORY LAPAROTOMY, LYSIS OF ADHESIONS ,BOWEL RESECTION;  Surgeon: Donnie Mesa, MD;  Location: [place];  Service: General;  Laterality: N/A;   NASAL SEPTUM SURGERY     precutaneous transluminal coronary angioplasty     right hernia     TONSILLECTOMY AND ADENOIDECTOMY      Family History  Problem Relation Age of Onset   Heart disease Father  Heart attack Father    Atrial fibrillation Father    Heart disease Mother    Atrial fibrillation Mother    Stroke Mother    AAA (abdominal aortic aneurysm) Brother     Social History   Socioeconomic History   Marital status: Married    Spouse name: Not on file   Number of children: 3   Years of education: 16   Highest education level: Not on file  Occupational History   Occupation: [employer]  Tobacco Use   Smoking status: Former    Packs/day: 1.00    Years: 60.00    Pack years: 60.00    Types: Cigarettes    Quit date: 03/25/1998    Years since quitting: 22.5   Smokeless  tobacco: Never  Vaping Use   Vaping Use: Never used  Substance and Sexual Activity   Alcohol use: Yes    Alcohol/week: 2.0 - 3.0 standard drinks    Types: 2 - 3 Glasses of wine per week    Comment: 1 glass of red wine every 2-3 night    Drug use: No   Sexual activity: Not on file  Other Topics Concern   Not on file  Social History Narrative   Fun: Working outside in yard, cutting wood    Denies abuse and feels safe at home.    Right handed   Lives at home with wife, daughter, granddaughter, and son (periodically)   Social Determinants of Health   Financial Resource Strain: Not on file  Food Insecurity: Not on file  Transportation Needs: Not on file  Physical Activity: Not on file  Stress: Not on file  Social Connections: Not on file  Intimate Partner Violence: Not on file    Outpatient Medications Prior to Visit  Medication Sig Dispense Refill   acetaminophen (TYLENOL) 650 MG CR tablet Take 650 mg by mouth daily as needed for pain.     apixaban (ELIQUIS) 5 MG TABS tablet Take 1 tablet (5 mg total) by mouth 2 (two) times daily. 60 tablet 11   atenolol (TENORMIN) 25 MG tablet Take 1 tablet (25 mg total) by mouth daily. 30 tablet 3   atorvastatin (LIPITOR) 40 MG tablet TAKE 1/2 TABLET BY MOUTH DAILY 45 tablet 3   Coenzyme Q10 (COQ10) 200 MG CAPS Take 1 capsule by mouth daily. 90 capsule 0   Cyanocobalamin (VITAMIN B-12) 1000 MCG SUBL Place 1 tablet (1,000 mcg total) under the tongue daily. 90 tablet 0   hydroxypropyl methylcellulose / hypromellose (ISOPTO TEARS / GONIOVISC) 2.5 % ophthalmic solution Place 1 drop into both eyes 2 (two) times daily as needed for dry eyes.     MELATONIN PO Take 1 tablet by mouth at bedtime.     nitroGLYCERIN (NITROSTAT) 0.4 MG SL tablet Dissolve 1 tablet under the tongue every 5 minutes as needed 90 tablet 1   oxymetazoline (AFRIN) 0.05 % nasal spray Place 2 sprays into both nostrils at bedtime.     pantoprazole (PROTONIX) 40 MG tablet Take 1  tablet (40 mg total) by mouth daily.     tamsulosin (FLOMAX) 0.4 MG CAPS capsule Take 1 capsule (0.4 mg total) by mouth daily after supper. 180 capsule 0   No facility-administered medications prior to visit.    Allergies  Allergen Reactions   Other Other (See Comments)    Some form of numbing medication used by dentist, almost died   Simvastatin Other (See Comments)    Muscle aches     ROS  Review of Systems  Constitutional:  Positive for fatigue. Negative for chills and fever.  Respiratory:  Negative for cough and shortness of breath.   Cardiovascular:  Negative for chest pain.  Gastrointestinal:  Negative for abdominal pain.  Neurological:  Positive for dizziness, weakness and light-headedness. Negative for syncope and speech difficulty.     Objective:    Physical Exam Vitals reviewed.  Cardiovascular:     Rate and Rhythm: Normal rate.     Comments: Irregular rhythm consistent with his atrial fibrillation. Pulmonary:     Effort: Pulmonary effort is normal.     Breath sounds: Normal breath sounds.  Musculoskeletal:     Right lower leg: No edema.     Left lower leg: No edema.  Neurological:     General: No focal deficit present.    BP (!) 90/50 (BP Location: Left Arm, Patient Position: Sitting, Cuff Size: Normal)   Pulse (!) 105   Temp 98 F (36.7 C) (Oral)   Wt 195 lb 3.2 oz (88.5 kg)   SpO2 98%   BMI 26.47 kg/m  Wt Readings from Last 3 Encounters:  10/07/20 195 lb 3.2 oz (88.5 kg)  10/02/20 196 lb (88.9 kg)  09/14/20 211 lb 10.3 oz (96 kg)     Health Maintenance Due  Topic Date Due   COVID-19 Vaccine (5 - Booster for Pfizer series) 09/19/2020   INFLUENZA VACCINE  09/14/2020    There are no preventive care reminders to display for this patient.  Lab Results  Component Value Date   TSH 3.18 07/20/2020   Lab Results  Component Value Date   WBC 7.1 10/02/2020   HGB 11.6 (L) 10/02/2020   HCT 37.0 (L) 10/02/2020   MCV 89.2 10/02/2020   PLT 397  10/02/2020   Lab Results  Component Value Date   NA 140 10/02/2020   K 4.6 10/02/2020   CO2 25 10/02/2020   GLUCOSE 114 (H) 10/02/2020   BUN 15 10/02/2020   CREATININE 0.87 10/02/2020   BILITOT 0.7 10/02/2020   ALKPHOS 123 09/17/2020   AST 91 (H) 10/02/2020   ALT 79 (H) 10/02/2020   PROT 6.5 10/02/2020   ALBUMIN 2.0 (L) 09/17/2020   CALCIUM 9.6 10/02/2020   ANIONGAP 10 09/17/2020   GFR 78.53 07/20/2020   Lab Results  Component Value Date   CHOL 124 07/20/2020   Lab Results  Component Value Date   HDL 49.60 07/20/2020   Lab Results  Component Value Date   LDLCALC 60 07/20/2020   Lab Results  Component Value Date   TRIG 54 09/14/2020   Lab Results  Component Value Date   CHOLHDL 2 07/20/2020   Lab Results  Component Value Date   HGBA1C 5.9 07/20/2020      Assessment & Plan:   Persistent low blood pressure.  Suspect related to several factors including recent weight loss since his surgery, mild anemia, possibly decreased fluid intake (though recent labs did not suggest severe dehydration), medications.  He remains on tamsulosin which can also lower blood pressure.  Recent discontinuation of amlodipine.  Repeat blood pressure by me today 82/50 seated and 75/50 standing.  Patient minimally symptomatic.  Able to ambulate with walker.  -Increase fluid intake -liberalize salt intake somewhat -We discussed holding tamsulosin for now as he has indwelling Foley catheter with no imminent plans to remove. -Change positions slowly -Recommend close follow-up with primary.  If blood pressure not improving over the next few days may need to consider medication  options such as midodrine  No orders of the defined types were placed in this encounter.   Follow-up: No follow-ups on file.    Carolann Littler, MD

## 2020-10-07 NOTE — Telephone Encounter (Signed)
Patient seen at urology today and BP was 85/59

## 2020-10-07 NOTE — Patient Instructions (Signed)
Be sure to stay well hydrated- at least 2 liters fluid per day  Liberalize the sodium somewhat  Hold the Tamsulosin for now .    Change positions slowly.   Let us know if dizziness not improving over the next few days.

## 2020-10-08 DIAGNOSIS — Z48815 Encounter for surgical aftercare following surgery on the digestive system: Secondary | ICD-10-CM | POA: Diagnosis not present

## 2020-10-08 DIAGNOSIS — N401 Enlarged prostate with lower urinary tract symptoms: Secondary | ICD-10-CM | POA: Diagnosis not present

## 2020-10-08 DIAGNOSIS — R338 Other retention of urine: Secondary | ICD-10-CM | POA: Diagnosis not present

## 2020-10-08 DIAGNOSIS — I872 Venous insufficiency (chronic) (peripheral): Secondary | ICD-10-CM | POA: Diagnosis not present

## 2020-10-08 DIAGNOSIS — I480 Paroxysmal atrial fibrillation: Secondary | ICD-10-CM | POA: Diagnosis not present

## 2020-10-08 DIAGNOSIS — R351 Nocturia: Secondary | ICD-10-CM | POA: Diagnosis not present

## 2020-10-08 DIAGNOSIS — I10 Essential (primary) hypertension: Secondary | ICD-10-CM | POA: Diagnosis not present

## 2020-10-08 DIAGNOSIS — K219 Gastro-esophageal reflux disease without esophagitis: Secondary | ICD-10-CM | POA: Diagnosis not present

## 2020-10-08 DIAGNOSIS — I2510 Atherosclerotic heart disease of native coronary artery without angina pectoris: Secondary | ICD-10-CM | POA: Diagnosis not present

## 2020-10-08 DIAGNOSIS — R339 Retention of urine, unspecified: Secondary | ICD-10-CM | POA: Diagnosis not present

## 2020-10-08 NOTE — Telephone Encounter (Signed)
Spoke with patient care giver. Stated patient stopped Rx tamsulosin as directed by Dr Elease Hashimoto  BP 114/60 and 124/83 today  Advise to continue monitor BP at home let us know if BP continues to be low

## 2020-10-08 NOTE — Telephone Encounter (Signed)
Noted. Please have them monitor at home and follow up here if it continues to be low.  William Norman. [name], MD 10/08/2020 9:32 AM

## 2020-10-13 DIAGNOSIS — I2510 Atherosclerotic heart disease of native coronary artery without angina pectoris: Secondary | ICD-10-CM | POA: Diagnosis not present

## 2020-10-13 DIAGNOSIS — I10 Essential (primary) hypertension: Secondary | ICD-10-CM | POA: Diagnosis not present

## 2020-10-13 DIAGNOSIS — Z8673 Personal history of transient ischemic attack (TIA), and cerebral infarction without residual deficits: Secondary | ICD-10-CM

## 2020-10-13 DIAGNOSIS — R339 Retention of urine, unspecified: Secondary | ICD-10-CM | POA: Diagnosis not present

## 2020-10-13 DIAGNOSIS — R351 Nocturia: Secondary | ICD-10-CM | POA: Diagnosis not present

## 2020-10-13 DIAGNOSIS — I872 Venous insufficiency (chronic) (peripheral): Secondary | ICD-10-CM | POA: Diagnosis not present

## 2020-10-13 DIAGNOSIS — I480 Paroxysmal atrial fibrillation: Secondary | ICD-10-CM | POA: Diagnosis not present

## 2020-10-13 DIAGNOSIS — Z7901 Long term (current) use of anticoagulants: Secondary | ICD-10-CM

## 2020-10-13 DIAGNOSIS — R338 Other retention of urine: Secondary | ICD-10-CM | POA: Diagnosis not present

## 2020-10-13 DIAGNOSIS — E663 Overweight: Secondary | ICD-10-CM

## 2020-10-13 DIAGNOSIS — Z48815 Encounter for surgical aftercare following surgery on the digestive system: Secondary | ICD-10-CM | POA: Diagnosis not present

## 2020-10-13 DIAGNOSIS — Z87891 Personal history of nicotine dependence: Secondary | ICD-10-CM

## 2020-10-13 DIAGNOSIS — Z466 Encounter for fitting and adjustment of urinary device: Secondary | ICD-10-CM

## 2020-10-13 DIAGNOSIS — K219 Gastro-esophageal reflux disease without esophagitis: Secondary | ICD-10-CM | POA: Diagnosis not present

## 2020-10-13 DIAGNOSIS — N401 Enlarged prostate with lower urinary tract symptoms: Secondary | ICD-10-CM | POA: Diagnosis not present

## 2020-10-14 DIAGNOSIS — I2510 Atherosclerotic heart disease of native coronary artery without angina pectoris: Secondary | ICD-10-CM | POA: Diagnosis not present

## 2020-10-14 DIAGNOSIS — Z48815 Encounter for surgical aftercare following surgery on the digestive system: Secondary | ICD-10-CM | POA: Diagnosis not present

## 2020-10-14 DIAGNOSIS — I872 Venous insufficiency (chronic) (peripheral): Secondary | ICD-10-CM | POA: Diagnosis not present

## 2020-10-14 DIAGNOSIS — R351 Nocturia: Secondary | ICD-10-CM | POA: Diagnosis not present

## 2020-10-14 DIAGNOSIS — K219 Gastro-esophageal reflux disease without esophagitis: Secondary | ICD-10-CM | POA: Diagnosis not present

## 2020-10-14 DIAGNOSIS — I10 Essential (primary) hypertension: Secondary | ICD-10-CM | POA: Diagnosis not present

## 2020-10-14 DIAGNOSIS — R339 Retention of urine, unspecified: Secondary | ICD-10-CM | POA: Diagnosis not present

## 2020-10-14 DIAGNOSIS — N401 Enlarged prostate with lower urinary tract symptoms: Secondary | ICD-10-CM | POA: Diagnosis not present

## 2020-10-14 DIAGNOSIS — I480 Paroxysmal atrial fibrillation: Secondary | ICD-10-CM | POA: Diagnosis not present

## 2020-10-14 DIAGNOSIS — R338 Other retention of urine: Secondary | ICD-10-CM | POA: Diagnosis not present

## 2020-10-15 DIAGNOSIS — I2510 Atherosclerotic heart disease of native coronary artery without angina pectoris: Secondary | ICD-10-CM | POA: Diagnosis not present

## 2020-10-15 DIAGNOSIS — I480 Paroxysmal atrial fibrillation: Secondary | ICD-10-CM | POA: Diagnosis not present

## 2020-10-15 DIAGNOSIS — I10 Essential (primary) hypertension: Secondary | ICD-10-CM | POA: Diagnosis not present

## 2020-10-15 DIAGNOSIS — R351 Nocturia: Secondary | ICD-10-CM | POA: Diagnosis not present

## 2020-10-15 DIAGNOSIS — Z48815 Encounter for surgical aftercare following surgery on the digestive system: Secondary | ICD-10-CM | POA: Diagnosis not present

## 2020-10-15 DIAGNOSIS — N401 Enlarged prostate with lower urinary tract symptoms: Secondary | ICD-10-CM | POA: Diagnosis not present

## 2020-10-15 DIAGNOSIS — I872 Venous insufficiency (chronic) (peripheral): Secondary | ICD-10-CM | POA: Diagnosis not present

## 2020-10-15 DIAGNOSIS — R338 Other retention of urine: Secondary | ICD-10-CM | POA: Diagnosis not present

## 2020-10-15 DIAGNOSIS — K219 Gastro-esophageal reflux disease without esophagitis: Secondary | ICD-10-CM | POA: Diagnosis not present

## 2020-10-15 DIAGNOSIS — R339 Retention of urine, unspecified: Secondary | ICD-10-CM | POA: Diagnosis not present

## 2020-10-16 ENCOUNTER — Other Ambulatory Visit: Payer: Self-pay

## 2020-10-16 ENCOUNTER — Telehealth: Payer: Self-pay | Admitting: Cardiology

## 2020-10-16 ENCOUNTER — Encounter (HOSPITAL_BASED_OUTPATIENT_CLINIC_OR_DEPARTMENT_OTHER): Payer: Self-pay | Admitting: Emergency Medicine

## 2020-10-16 ENCOUNTER — Emergency Department (HOSPITAL_BASED_OUTPATIENT_CLINIC_OR_DEPARTMENT_OTHER): Payer: Medicare HMO | Admitting: Radiology

## 2020-10-16 ENCOUNTER — Telehealth: Payer: Self-pay

## 2020-10-16 ENCOUNTER — Emergency Department (HOSPITAL_BASED_OUTPATIENT_CLINIC_OR_DEPARTMENT_OTHER)
Admission: EM | Admit: 2020-10-16 | Discharge: 2020-10-16 | Disposition: A | Discharge status: Home / Self Care | Payer: Medicare HMO | Attending: Emergency Medicine | Admitting: Emergency Medicine

## 2020-10-16 DIAGNOSIS — I2510 Atherosclerotic heart disease of native coronary artery without angina pectoris: Secondary | ICD-10-CM | POA: Insufficient documentation

## 2020-10-16 DIAGNOSIS — I10 Essential (primary) hypertension: Secondary | ICD-10-CM | POA: Diagnosis not present

## 2020-10-16 DIAGNOSIS — R0902 Hypoxemia: Secondary | ICD-10-CM | POA: Diagnosis not present

## 2020-10-16 DIAGNOSIS — I872 Venous insufficiency (chronic) (peripheral): Secondary | ICD-10-CM | POA: Diagnosis not present

## 2020-10-16 DIAGNOSIS — R0602 Shortness of breath: Secondary | ICD-10-CM | POA: Diagnosis not present

## 2020-10-16 DIAGNOSIS — I4891 Unspecified atrial fibrillation: Secondary | ICD-10-CM | POA: Diagnosis not present

## 2020-10-16 DIAGNOSIS — R310 Gross hematuria: Secondary | ICD-10-CM | POA: Diagnosis not present

## 2020-10-16 DIAGNOSIS — Z87891 Personal history of nicotine dependence: Secondary | ICD-10-CM | POA: Diagnosis not present

## 2020-10-16 DIAGNOSIS — Z955 Presence of coronary angioplasty implant and graft: Secondary | ICD-10-CM | POA: Insufficient documentation

## 2020-10-16 DIAGNOSIS — K219 Gastro-esophageal reflux disease without esophagitis: Secondary | ICD-10-CM | POA: Diagnosis not present

## 2020-10-16 DIAGNOSIS — R339 Retention of urine, unspecified: Secondary | ICD-10-CM | POA: Diagnosis not present

## 2020-10-16 DIAGNOSIS — Z48815 Encounter for surgical aftercare following surgery on the digestive system: Secondary | ICD-10-CM | POA: Diagnosis not present

## 2020-10-16 DIAGNOSIS — R351 Nocturia: Secondary | ICD-10-CM | POA: Diagnosis not present

## 2020-10-16 DIAGNOSIS — R338 Other retention of urine: Secondary | ICD-10-CM | POA: Diagnosis not present

## 2020-10-16 DIAGNOSIS — N401 Enlarged prostate with lower urinary tract symptoms: Secondary | ICD-10-CM | POA: Diagnosis not present

## 2020-10-16 DIAGNOSIS — I480 Paroxysmal atrial fibrillation: Secondary | ICD-10-CM | POA: Diagnosis not present

## 2020-10-16 DIAGNOSIS — Z7901 Long term (current) use of anticoagulants: Secondary | ICD-10-CM | POA: Insufficient documentation

## 2020-10-16 LAB — BASIC METABOLIC PANEL
Anion gap: 9 (ref 5–15)
BUN: 19 mg/dL (ref 8–23)
CO2: 25 mmol/L (ref 22–32)
Calcium: 9.6 mg/dL (ref 8.9–10.3)
Chloride: 103 mmol/L (ref 98–111)
Creatinine, Ser: 0.78 mg/dL (ref 0.61–1.24)
GFR, Estimated: >60 mL/min (ref >60)
Glucose, Bld: 95 mg/dL (ref 70–99)
Potassium: 4.1 mmol/L (ref 3.5–5.1)
Sodium: 137 mmol/L (ref 135–145)

## 2020-10-16 LAB — CBC WITH DIFFERENTIAL/PLATELET
Abs Immature Granulocytes: 0.18 10*3/uL — ABNORMAL HIGH (ref 0.00–0.07)
Basophils Absolute: 0.1 10*3/uL (ref 0.0–0.1)
Basophils Relative: 1 %
Eosinophils Absolute: 0.1 10*3/uL (ref 0.0–0.5)
Eosinophils Relative: 1 %
HCT: 37.5 % — ABNORMAL LOW (ref 39.0–52.0)
Hemoglobin: 11.6 g/dL — ABNORMAL LOW (ref 13.0–17.0)
Immature Granulocytes: 2 %
Lymphocytes Relative: 27 %
Lymphs Abs: 2.1 10*3/uL (ref 0.7–4.0)
MCH: 27.3 pg (ref 26.0–34.0)
MCHC: 30.9 g/dL (ref 30.0–36.0)
MCV: 88.2 fL (ref 80.0–100.0)
Monocytes Absolute: 1 10*3/uL (ref 0.1–1.0)
Monocytes Relative: 13 %
Neutro Abs: 4.6 10*3/uL (ref 1.7–7.7)
Neutrophils Relative %: 56 %
Platelets: 380 10*3/uL (ref 150–400)
RBC: 4.25 MIL/uL (ref 4.22–5.81)
RDW: 15 % (ref 11.5–15.5)
WBC: 8 10*3/uL (ref 4.0–10.5)
nRBC: 0 % (ref 0.0–0.2)

## 2020-10-16 LAB — TROPONIN I (HIGH SENSITIVITY): Troponin I (High Sensitivity): 9 ng/L (ref <18)

## 2020-10-16 IMAGING — DX DG CHEST 2V
2 series · 2 of 2 positions shown · non-contrast
Comparison: Radiograph [DATE]

CLINICAL DATA: Shortness of breath.

EXAM:
CHEST - 2 VIEW

[chest pa]
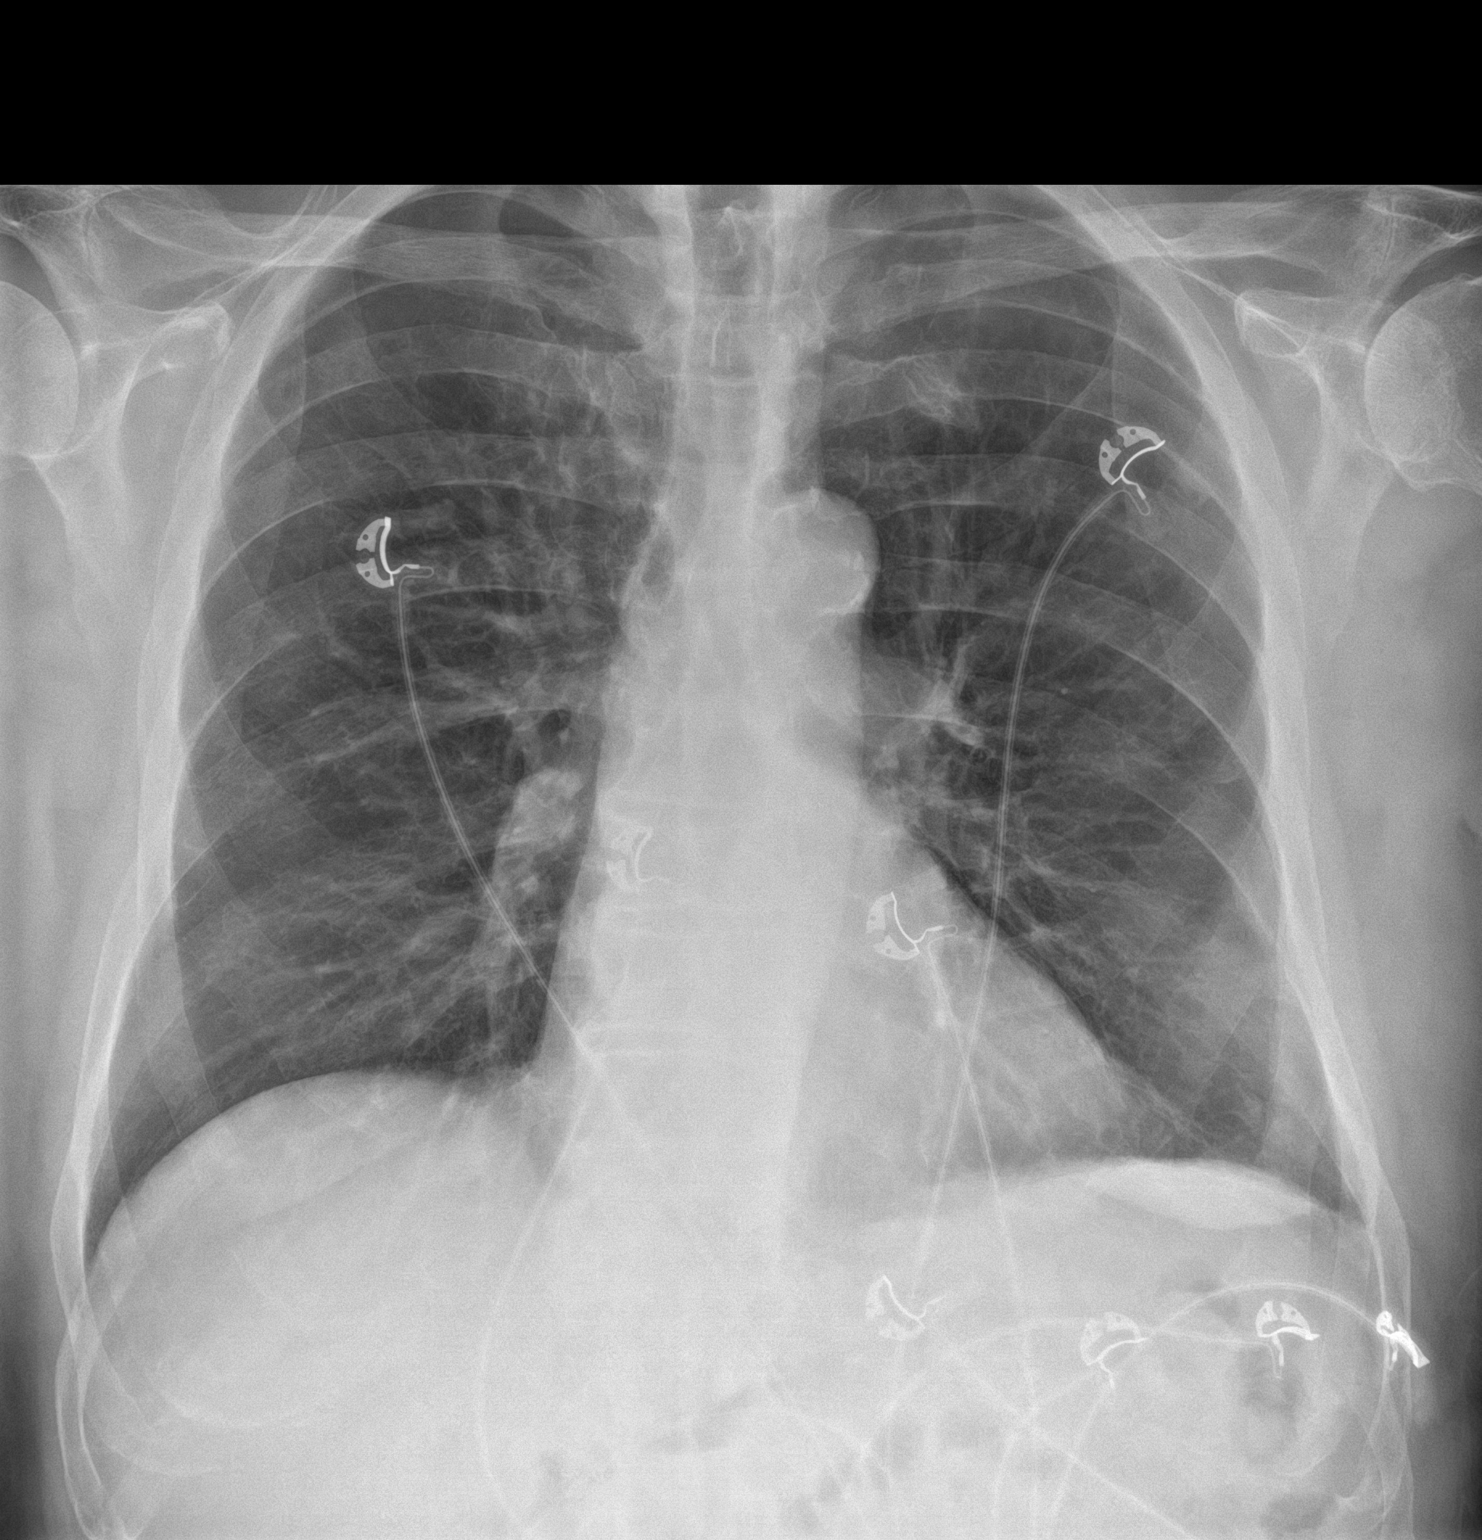

[chest lat]
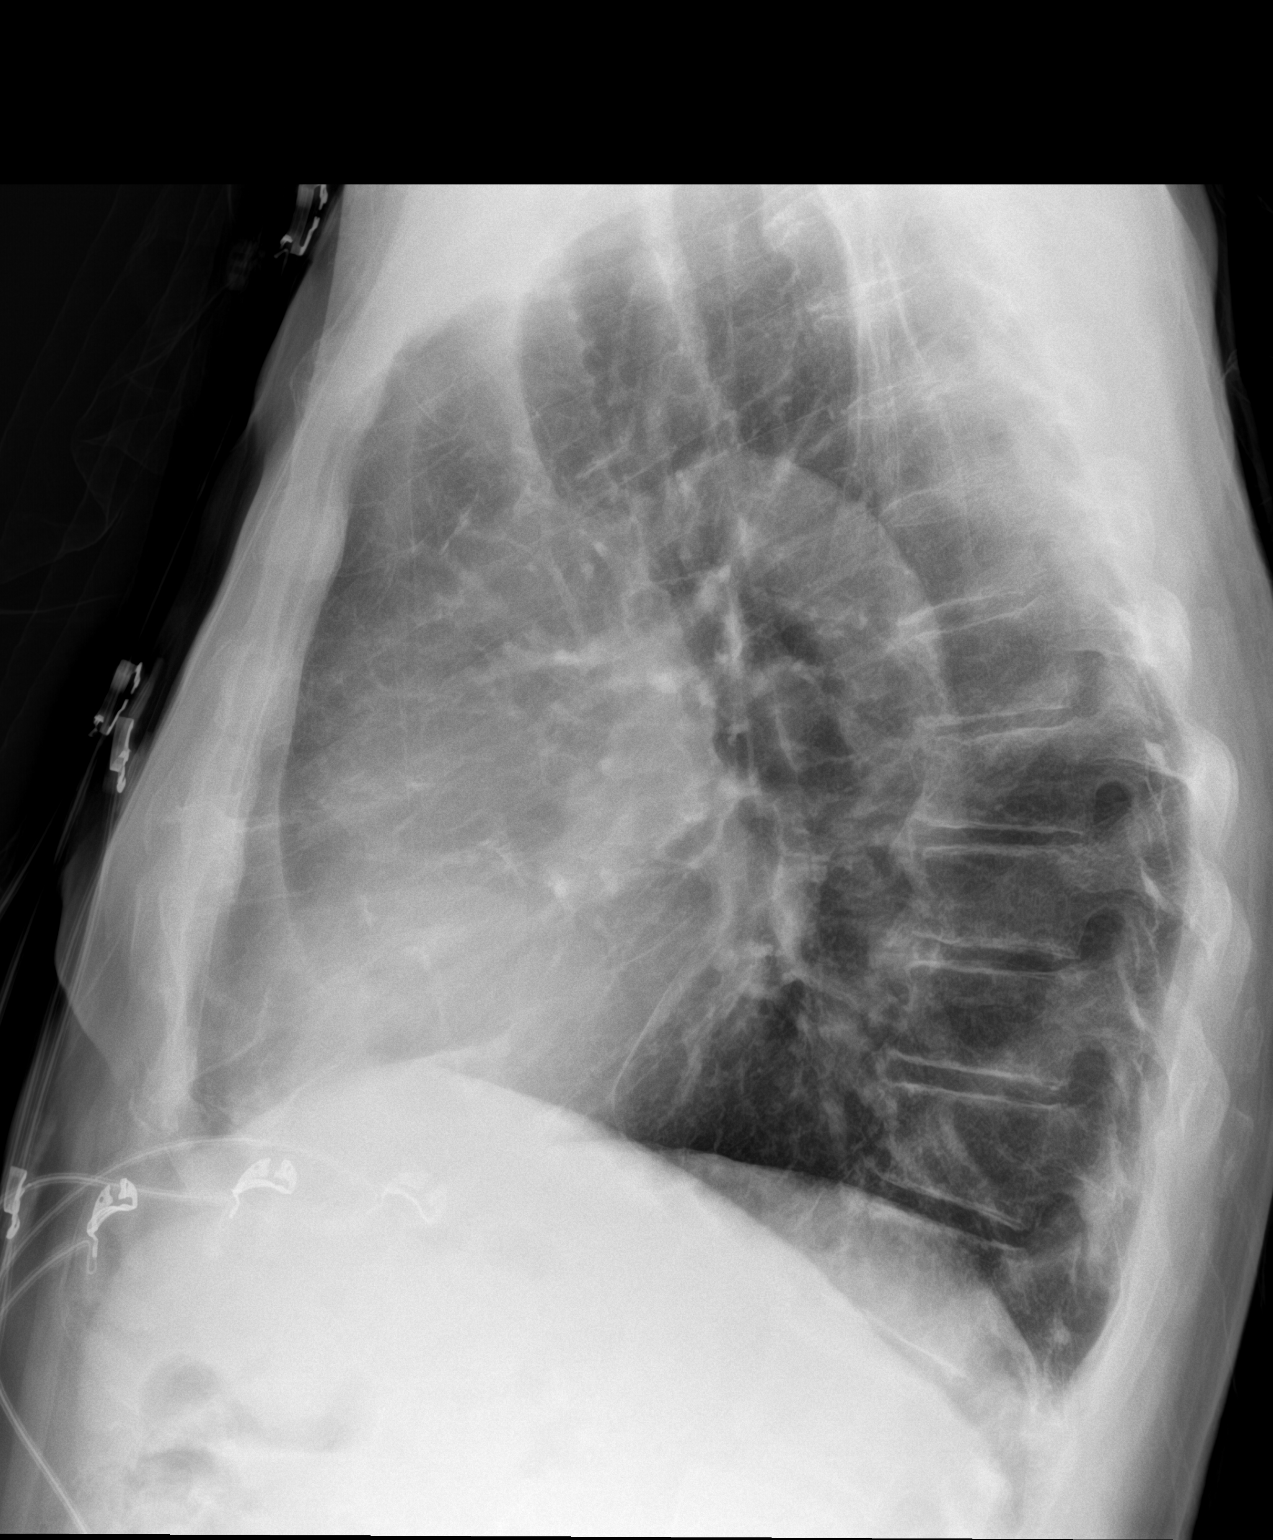

[2 of 2 positions shown; findings below may reference images not displayed]

FINDINGS: Right central line has been removed. Enteric tube has been removed.
Normal heart size. Unchanged mediastinal contours with aortic
atherosclerosis. There is no pneumothorax or large pleural effusion.
No pulmonary edema. No confluent airspace disease. No acute osseous
abnormalities are seen. Thoracic spondylosis.
IMPRESSION: No acute chest findings or explanation for shortness of breath.

## 2020-10-16 NOTE — ED Notes (Signed)
Patient transported to X-ray 

## 2020-10-16 NOTE — ED Notes (Signed)
Pt verbalizes understanding of discharge instructions. Opportunity for questioning and answers were provided. Armand removed by staff, pt discharged from ED to home. Educated to f/u with PCP.  

## 2020-10-16 NOTE — Telephone Encounter (Signed)
Melissa from [employer] with [facility] called stating that she has been with William Norman today. William Norman has been moving around and getting up/down. Melissa stated that while he was moving his oxygen dropped to 76%. Melissa wanted Dr [name] to be aware in case William Norman needs an oxygen order put in. Lenna Sciara stated that it is best to call William Norman at [phone]. Please Advise.

## 2020-10-16 NOTE — Discharge Instructions (Addendum)
Follow-up with your primary doctor.  Come back to ER if you develop shortness of breath, chest pain or other new concerning symptom.

## 2020-10-16 NOTE — Telephone Encounter (Signed)
Patient states he had a Home Health nurse visit him today and when he started walking 60 feet his oxygen dropped from 90% to 70%. He states he has also been tired. He says they reported it to his PCP and he was told he needed to go to the ED and it could be a blood clot. He would like to know if he does need to still go to the ED.

## 2020-10-16 NOTE — ED Provider Notes (Signed)
[facility] EMERGENCY DEPT Provider Note   CSN: [id number] Arrival date & time: 10/16/20  1730     History Chief Complaint  Patient presents with   Hypoxia    William Norman is a 80 y.o. male.  Presents for hypoxia.  Patient reports that while with his home health nurse earlier today she checked his oxygen while ambulating.  He did not have any symptoms at the time but states that his oxygen level dropped to 76%.  Currently he has no medical complaints.  Feels fine.  States that he has not been dealing with any increased work of breathing lately, no chest pain.  No leg swelling.  Has history of CAD, DVT, A. fib, on anticoagulation with Eliquis.  HPI     Past Medical History:  Diagnosis Date   BPH (benign prostatic hypertrophy)    BPH associated with nocturia 08/29/2013   BPH with obstruction/lower urinary tract symptoms 08/14/2019   CAD (coronary artery disease)    CAD (coronary artery disease), native coronary artery    PTCA of RCA 1986, PTCA of LAD 1992,  Negative treadmill Cardiolite 2011    Chronic dermatitis of hands 10/12/2015   Chronic venous insufficiency    Coagulation disorder ([place]) 10/26/2018   DVT (deep venous thrombosis) (HCC)    Dyslipidemia    Eczema    Erectile dysfunction 07/11/2012   Essential hypertension    GERD 08/04/2006       GERD (gastroesophageal reflux disease)    Hyperglycemia 07/08/2019   Hyperlipidemia    Hypertension    Incomplete emptying of bladder 08/14/2019   Long term current use of anticoagulant therapy    Lumbar disc disease    Overweight (BMI 25.0-29.9) 06/18/2015   Persistent atrial fibrillation ([place]) 04/10/2018   Personal history of DVT (deep vein thrombosis)    Initially in 1998 with recurrence in 2002 now on chronic warfarin    Soft tissue lesion of foot 08/27/2015   Stroke ([place])    Urinary tract infection with hematuria 08/14/2019    Patient Active Problem List   Diagnosis Date Noted    Constipation 10/02/2020   Protein-calorie malnutrition, severe 09/03/2020   SBO (small bowel obstruction) ([place]) 08/28/2020   AAA (abdominal aortic aneurysm) ([place]) 03/06/2020   Porokeratosis 02/25/2020   History of ischemic stroke 02/16/2020   Eczema    DVT (deep venous thrombosis) (HCC)    CAD (coronary artery disease)    Recurrent UTI 08/14/2019   Hyperglycemia 07/08/2019   Persistent atrial fibrillation ([place]) 04/10/2018   Chronic dermatitis of hands 10/12/2015   Soft tissue lesion of foot 08/27/2015   BPH associated with nocturia 08/29/2013   CAD (coronary artery disease), native coronary artery    Long term current use of anticoagulant therapy    Personal history of DVT (deep vein thrombosis)    Chronic venous insufficiency    Erectile dysfunction 07/11/2012   Lumbar disc disease    GERD 08/04/2006   Dyslipidemia    Essential hypertension     Past Surgical History:  Procedure Laterality Date   APPLICATION OF WOUND VAC  09/02/2020   Procedure: APPLICATION OF WOUND VAC;  Surgeon: Donnie Mesa, MD;  Location: [facility];  Service: General;;   CARDIAC CATHETERIZATION     CARPAL TUNNEL RELEASE  04/21/2011   Procedure: CARPAL TUNNEL RELEASE;  Surgeon: Tennis Must, MD;  Location: [facility];  Service: Orthopedics;  Laterality: Left;   CARPAL TUNNEL RELEASE  05/23/2011   Procedure: CARPAL  TUNNEL RELEASE;  Surgeon: Tennis Must, MD;  Location: [facility];  Service: Orthopedics;  Laterality: Right;   COLON SURGERY  2010 and 2011 colostomy reversal   CORONARY ANGIOPLASTY     EYE SURGERY     repair of macular hole   LAPAROTOMY N/A 09/02/2020   Procedure: EXPLORATORY LAPAROTOMY, LYSIS OF ADHESIONS ,BOWEL RESECTION;  Surgeon: Donnie Mesa, MD;  Location: [facility];  Service: General;  Laterality: N/A;   NASAL SEPTUM SURGERY     precutaneous transluminal coronary angioplasty     right hernia     TONSILLECTOMY AND ADENOIDECTOMY         Family History   Problem Relation Age of Onset   Heart disease Father    Heart attack Father    Atrial fibrillation Father    Heart disease Mother    Atrial fibrillation Mother    Stroke Mother    AAA (abdominal aortic aneurysm) Brother     Social History   Tobacco Use   Smoking status: Former    Packs/day: 1.00    Years: 60.00    Pack years: 60.00    Types: Cigarettes    Quit date: 03/25/1998    Years since quitting: 22.5   Smokeless tobacco: Never  Vaping Use   Vaping Use: Never used  Substance Use Topics   Alcohol use: Yes    Alcohol/week: 2.0 - 3.0 standard drinks    Types: 2 - 3 Glasses of wine per week    Comment: 1 glass of red wine every 2-3 night    Drug use: No    Home Medications Prior to Admission medications   Medication Sig Start Date End Date Taking? Authorizing Provider  acetaminophen (TYLENOL) 650 MG CR tablet Take 650 mg by mouth daily as needed for pain.    [provider]  apixaban (ELIQUIS) 5 MG TABS tablet Take 1 tablet (5 mg total) by mouth 2 (two) times daily. 02/17/20   Danford, Suann Larry, MD  atenolol (TENORMIN) 25 MG tablet Take 1 tablet (25 mg total) by mouth daily. 09/18/20   Nita Sells, MD  atorvastatin (LIPITOR) 40 MG tablet TAKE 1/2 TABLET BY MOUTH DAILY 07/03/20   Vivi Barrack, MD  Coenzyme Q10 (COQ10) 200 MG CAPS Take 1 capsule by mouth daily. 07/23/20   Vivi Barrack, MD  Cyanocobalamin (VITAMIN B-12) 1000 MCG SUBL Place 1 tablet (1,000 mcg total) under the tongue daily. 07/24/20   Vivi Barrack, MD  hydroxypropyl methylcellulose / hypromellose (ISOPTO TEARS / GONIOVISC) 2.5 % ophthalmic solution Place 1 drop into both eyes 2 (two) times daily as needed for dry eyes.    [provider]  MELATONIN PO Take 1 tablet by mouth at bedtime.    [provider]  nitroGLYCERIN (NITROSTAT) 0.4 MG SL tablet Dissolve 1 tablet under the tongue every 5 minutes as needed 01/19/17   Vivi Barrack, MD  oxymetazoline (AFRIN) 0.05 %  nasal spray Place 2 sprays into both nostrils at bedtime.    [provider]  pantoprazole (PROTONIX) 40 MG tablet Take 1 tablet (40 mg total) by mouth daily. 09/18/20   Nita Sells, MD  tamsulosin (FLOMAX) 0.4 MG CAPS capsule Take 1 capsule (0.4 mg total) by mouth daily after supper. 09/17/20   Nita Sells, MD    Allergies    Other and Simvastatin  Review of Systems   Review of Systems  Constitutional:  Negative for chills and fever.  HENT:  Negative  for ear pain and sore throat.   Eyes:  Negative for pain and visual disturbance.  Respiratory:  Negative for cough and shortness of breath.   Cardiovascular:  Negative for chest pain and palpitations.  Gastrointestinal:  Negative for abdominal pain and vomiting.  Genitourinary:  Negative for dysuria and hematuria.  Musculoskeletal:  Negative for arthralgias and back pain.  Skin:  Negative for color change and rash.  Neurological:  Negative for seizures and syncope.  All other systems reviewed and are negative.  Physical Exam Updated Vital Signs BP 139/86   Pulse 84   Temp 98.2 F (36.8 C)   Resp (!) 23   SpO2 97%   Physical Exam Vitals and nursing note reviewed.  Constitutional:      Appearance: He is well-developed.  HENT:     Head: Normocephalic and atraumatic.  Eyes:     Conjunctiva/sclera: Conjunctivae normal.  Cardiovascular:     Rate and Rhythm: Normal rate and regular rhythm.     Heart sounds: No murmur heard. Pulmonary:     Effort: Pulmonary effort is normal. No respiratory distress.     Breath sounds: Normal breath sounds.  Abdominal:     Palpations: Abdomen is soft.     Tenderness: There is no abdominal tenderness.  Musculoskeletal:     Cervical back: Neck supple.  Skin:    General: Skin is warm and dry.  Neurological:     General: No focal deficit present.     Mental Status: He is alert.  Psychiatric:        Mood and Affect: Mood normal.        Thought Content: Thought content  normal.    ED Results / Procedures / Treatments   Labs (all labs ordered are listed, but only abnormal results are displayed) Labs Reviewed  CBC WITH DIFFERENTIAL/PLATELET - Abnormal; Notable for the following components:      Result Value   Hemoglobin 11.6 (*)    HCT 37.5 (*)    Abs Immature Granulocytes 0.18 (*)    All other components within normal limits  BASIC METABOLIC PANEL  TROPONIN I (HIGH SENSITIVITY)  TROPONIN I (HIGH SENSITIVITY)    EKG EKG Interpretation  Date/Time:  Friday October 16 2020 19:13:44 EDT Ventricular Rate:  75 PR Interval:    QRS Duration: 96 QT Interval:  403 QTC Calculation: 451 R Axis:   75 Text Interpretation: Atrial fibrillation Confirmed by Madalyn Rob [phone]) on 10/16/2020 9:09:19 PM  Radiology DG Chest 2 View  Result Date: 10/16/2020 CLINICAL DATA:  Shortness of breath. EXAM: CHEST - 2 VIEW COMPARISON:  Radiograph 09/02/2020 FINDINGS: Right central line has been removed. Enteric tube has been removed. Normal heart size. Unchanged mediastinal contours with aortic atherosclerosis. There is no pneumothorax or large pleural effusion. No pulmonary edema. No confluent airspace disease. No acute osseous abnormalities are seen. Thoracic spondylosis. IMPRESSION: No acute chest findings or explanation for shortness of breath. Electronically Signed   By: Keith Rake M.D.   On: 10/16/2020 19:48    Procedures Procedures   Medications Ordered in ED Medications - No data to display  ED Course  I have reviewed the triage vital signs and the nursing notes.  Pertinent labs & imaging results that were available during my care of the patient were reviewed by me and considered in my medical decision making (see chart for details).    MDM Rules/Calculators/A&P  80 year old male came to ER due to concern for low oxygen while ambulating.  Patient has no medical complaints at present otherwise.  He appears well in no  distress.  Patient was ambulated in our emergency department and did not have any hypoxia.  Basic labs are stable, EKG without acute change, chest x-ray unremarkable.  Given patient's well appearance and reassuring work-up, believe he can be discharged and managed in the outpatient setting.  Recommend he follow-up with his primary doctor.    After the discussed management above, the patient was determined to be safe for discharge.  The patient was in agreement with this plan and all questions regarding their care were answered.  ED return precautions were discussed and the patient will return to the ED with any significant worsening of condition.  Final Clinical Impression(s) / ED Diagnoses Final diagnoses:  Shortness of breath    Rx / DC Orders ED Discharge Orders     None        Lucrezia Starch, MD 10/16/20 2109

## 2020-10-16 NOTE — ED Triage Notes (Addendum)
Pt presents to ED POV. Pt reports that home health nurse was ambulating pt and his O2 dropped to 76%. Pt denies SOB, does report feeling fatigued when ambulating.

## 2020-10-16 NOTE — ED Notes (Signed)
Ambulated Pt from Triage 1 around the whole ER. Pt started at 100% and stayed between 98%-100% the entire walk. Pt did say he felt more out of breath after that walk. Pt is currently 100% on room air.

## 2020-10-20 DIAGNOSIS — I10 Essential (primary) hypertension: Secondary | ICD-10-CM | POA: Diagnosis not present

## 2020-10-20 DIAGNOSIS — Z48815 Encounter for surgical aftercare following surgery on the digestive system: Secondary | ICD-10-CM | POA: Diagnosis not present

## 2020-10-20 DIAGNOSIS — I2510 Atherosclerotic heart disease of native coronary artery without angina pectoris: Secondary | ICD-10-CM | POA: Diagnosis not present

## 2020-10-20 DIAGNOSIS — K219 Gastro-esophageal reflux disease without esophagitis: Secondary | ICD-10-CM | POA: Diagnosis not present

## 2020-10-20 DIAGNOSIS — N401 Enlarged prostate with lower urinary tract symptoms: Secondary | ICD-10-CM | POA: Diagnosis not present

## 2020-10-20 DIAGNOSIS — R338 Other retention of urine: Secondary | ICD-10-CM | POA: Diagnosis not present

## 2020-10-20 DIAGNOSIS — R351 Nocturia: Secondary | ICD-10-CM | POA: Diagnosis not present

## 2020-10-20 DIAGNOSIS — R339 Retention of urine, unspecified: Secondary | ICD-10-CM | POA: Diagnosis not present

## 2020-10-20 DIAGNOSIS — I480 Paroxysmal atrial fibrillation: Secondary | ICD-10-CM | POA: Diagnosis not present

## 2020-10-20 DIAGNOSIS — I872 Venous insufficiency (chronic) (peripheral): Secondary | ICD-10-CM | POA: Diagnosis not present

## 2020-10-20 NOTE — Telephone Encounter (Signed)
Left message on patients voicemail to please return our call.   

## 2020-10-20 NOTE — Telephone Encounter (Signed)
He was seen in the ED.  [name]. [name], MD 10/20/2020 9:45 AM

## 2020-10-20 NOTE — Telephone Encounter (Signed)
See below

## 2020-10-21 DIAGNOSIS — N401 Enlarged prostate with lower urinary tract symptoms: Secondary | ICD-10-CM | POA: Diagnosis not present

## 2020-10-21 DIAGNOSIS — N390 Urinary tract infection, site not specified: Secondary | ICD-10-CM | POA: Diagnosis not present

## 2020-10-21 DIAGNOSIS — N138 Other obstructive and reflux uropathy: Secondary | ICD-10-CM | POA: Diagnosis not present

## 2020-10-21 NOTE — Telephone Encounter (Signed)
Left message on patients voicemail to please return our call.   

## 2020-10-22 ENCOUNTER — Encounter: Payer: Self-pay | Admitting: Family Medicine

## 2020-10-22 ENCOUNTER — Ambulatory Visit (INDEPENDENT_AMBULATORY_CARE_PROVIDER_SITE_OTHER): Payer: Medicare HMO | Admitting: Family Medicine

## 2020-10-22 ENCOUNTER — Other Ambulatory Visit: Payer: Self-pay

## 2020-10-22 VITALS — BP 107/70 | HR 77 | Temp 97.6°F | Wt 193.2 lb

## 2020-10-22 DIAGNOSIS — R351 Nocturia: Secondary | ICD-10-CM

## 2020-10-22 DIAGNOSIS — R0902 Hypoxemia: Secondary | ICD-10-CM | POA: Diagnosis not present

## 2020-10-22 DIAGNOSIS — N401 Enlarged prostate with lower urinary tract symptoms: Secondary | ICD-10-CM | POA: Diagnosis not present

## 2020-10-22 DIAGNOSIS — I10 Essential (primary) hypertension: Secondary | ICD-10-CM

## 2020-10-22 NOTE — Telephone Encounter (Signed)
After review of the patients chart it appears that he was evaluated in the ED on the 2nd. I will close this phone note at this time.

## 2020-10-22 NOTE — Assessment & Plan Note (Signed)
Not controlled after having stopped his Flomax.  He is having urinary obstruction.  With following up with urology next week.  May be undergoing TURP soon.

## 2020-10-22 NOTE — Patient Instructions (Signed)
It was very nice to see you today!  I am glad that you are doing well.   Do not need to make any changes today.  Please follow-up with urology as planned.  Take care, Dr [name]  PLEASE NOTE:  If you had any lab tests please let us know if you have not heard back within a few days. You may see your results on mychart before we have a chance to review them but we will give you a call once they are reviewed by [place]. If we ordered any referrals today, please let us know if you have not heard from their office within the next week.   Please try these tips to maintain a healthy lifestyle:  Eat at least 3 REAL meals and 1-2 snacks per day.  Aim for no more than 5 hours between eating.  If you eat breakfast, please do so within one hour of getting up.   Each meal should contain half fruits/vegetables, one quarter protein, and one quarter carbs (no bigger than a computer mouse)  Cut down on sweet beverages. This includes juice, soda, and sweet tea.   Drink at least 1 glass of water with each meal and aim for at least 8 glasses per day  Exercise at least 150 minutes every week.

## 2020-10-22 NOTE — Progress Notes (Signed)
Chief Complaint:  William Norman is a 80 y.o. male who presents today for a ED follow up visit visit.  Assessment/Plan:  New/Acute Problems: Hypoxia Reassuring work-up in the ED.  No recurrence of symptoms.  Normal today.  Possibly could be due to malfunctioning pulse oximeter.  Do not think we need to do any further testing at this point.  Chronic Problems Addressed Today: Essential hypertension Hemoglobin is better.  He is only on atenolol 25 mg daily at this point.  We stopped a few of his antihypertensives and tamsulosin was stopped couple of weeks ago.  We will continue home monitoring.  BPH associated with nocturia Not controlled after having stopped his Flomax.  He is having urinary obstruction.  With following up with urology next week.  May be undergoing TURP soon.     Subjective:  HPI:  Summary of ED visit: Reason for admission: Shortness of breath Summary of Hospital course: He went to ED on 10/16/2020 for shortness of breath and due to concern of low oxygen while ambulating.  Will help therapist noted that his pulse oximetry was in the 70s while ambulating at home.  He was directed to go to the ED.  Had work-up including labs, chest x-ray, and EKG.  All this was normal.  He was discharged home.  He not have any shortness of breath.  In addition to this, he checks his blood pressure at home. His blood pressure today at office was low. He notes overall he is doing well.  Is having worsening BPH symptoms.  Following with urology for this.  Has been intermittent catheterizing.  Cannot tolerate tamsulosin due to low blood pressure.  ROS: Per HPI, otherwise a complete review of systems was negative.   PMH:  The following were reviewed and entered/updated in epic: Past Medical History:  Diagnosis Date   BPH (benign prostatic hypertrophy)    BPH associated with nocturia 08/29/2013   BPH with obstruction/lower urinary tract symptoms 08/14/2019   CAD (coronary  artery disease)    CAD (coronary artery disease), native coronary artery    PTCA of RCA 1986, PTCA of LAD 1992,  Negative treadmill Cardiolite 2011    Chronic dermatitis of hands 10/12/2015   Chronic venous insufficiency    Coagulation disorder ([place]) 10/26/2018   DVT (deep venous thrombosis) (HCC)    Dyslipidemia    Eczema    Erectile dysfunction 07/11/2012   Essential hypertension    GERD 08/04/2006       GERD (gastroesophageal reflux disease)    Hyperglycemia 07/08/2019   Hyperlipidemia    Hypertension    Incomplete emptying of bladder 08/14/2019   Long term current use of anticoagulant therapy    Lumbar disc disease    Overweight (BMI 25.0-29.9) 06/18/2015   Persistent atrial fibrillation ([place]) 04/10/2018   Personal history of DVT (deep vein thrombosis)    Initially in 1998 with recurrence in 2002 now on chronic warfarin    Soft tissue lesion of foot 08/27/2015   Stroke ([place])    Urinary tract infection with hematuria 08/14/2019   Patient Active Problem List   Diagnosis Date Noted   Constipation 10/02/2020   Protein-calorie malnutrition, severe 09/03/2020   SBO (small bowel obstruction) ([place]) 08/28/2020   AAA (abdominal aortic aneurysm) ([place]) 03/06/2020   Porokeratosis 02/25/2020   History of ischemic stroke 02/16/2020   Eczema    DVT (deep venous thrombosis) (HCC)    CAD (coronary artery disease)    Recurrent UTI 08/14/2019  Hyperglycemia 07/08/2019   Persistent atrial fibrillation ([place]) 04/10/2018   Chronic dermatitis of hands 10/12/2015   Soft tissue lesion of foot 08/27/2015   BPH associated with nocturia 08/29/2013   CAD (coronary artery disease), native coronary artery    Long term current use of anticoagulant therapy    Personal history of DVT (deep vein thrombosis)    Chronic venous insufficiency    Erectile dysfunction 07/11/2012   Lumbar disc disease    GERD 08/04/2006   Dyslipidemia    Essential hypertension    Past Surgical History:  Procedure  Laterality Date   APPLICATION OF WOUND VAC  09/02/2020   Procedure: APPLICATION OF WOUND VAC;  Surgeon: Donnie Mesa, MD;  Location: [facility];  Service: General;;   CARDIAC CATHETERIZATION     CARPAL TUNNEL RELEASE  04/21/2011   Procedure: CARPAL TUNNEL RELEASE;  Surgeon: Tennis Must, MD;  Location: [facility];  Service: Orthopedics;  Laterality: Left;   CARPAL TUNNEL RELEASE  05/23/2011   Procedure: CARPAL TUNNEL RELEASE;  Surgeon: Tennis Must, MD;  Location: [facility];  Service: Orthopedics;  Laterality: Right;   COLON SURGERY  2010 and 2011 colostomy reversal   CORONARY ANGIOPLASTY     EYE SURGERY     repair of macular hole   LAPAROTOMY N/A 09/02/2020   Procedure: EXPLORATORY LAPAROTOMY, LYSIS OF ADHESIONS ,BOWEL RESECTION;  Surgeon: Donnie Mesa, MD;  Location: [facility];  Service: General;  Laterality: N/A;   NASAL SEPTUM SURGERY     precutaneous transluminal coronary angioplasty     right hernia     TONSILLECTOMY AND ADENOIDECTOMY      Family History  Problem Relation Age of Onset   Heart disease Father    Heart attack Father    Atrial fibrillation Father    Heart disease Mother    Atrial fibrillation Mother    Stroke Mother    AAA (abdominal aortic aneurysm) Brother     Medications- Reconciled discharge and current medications in Epic.  Current Outpatient Medications  Medication Sig Dispense Refill   acetaminophen (TYLENOL) 650 MG CR tablet Take 650 mg by mouth daily as needed for pain.     apixaban (ELIQUIS) 5 MG TABS tablet Take 1 tablet (5 mg total) by mouth 2 (two) times daily. 60 tablet 11   atenolol (TENORMIN) 25 MG tablet Take 1 tablet (25 mg total) by mouth daily. 30 tablet 3   atorvastatin (LIPITOR) 40 MG tablet TAKE 1/2 TABLET BY MOUTH DAILY 45 tablet 3   Cyanocobalamin (VITAMIN B-12) 1000 MCG SUBL Place 1 tablet (1,000 mcg total) under the tongue daily. (Patient taking differently: Take 1 tablet by mouth daily.) 90 tablet 0    hydroxypropyl methylcellulose / hypromellose (ISOPTO TEARS / GONIOVISC) 2.5 % ophthalmic solution Place 1 drop into both eyes 2 (two) times daily as needed for dry eyes.     nitroGLYCERIN (NITROSTAT) 0.4 MG SL tablet Dissolve 1 tablet under the tongue every 5 minutes as needed 90 tablet 1   oxymetazoline (AFRIN) 0.05 % nasal spray Place 2 sprays into both nostrils at bedtime.     Coenzyme Q10 (COQ10) 200 MG CAPS Take 1 capsule by mouth daily. (Patient not taking: Reported on 10/22/2020) 90 capsule 0   MELATONIN PO Take 1 tablet by mouth at bedtime. (Patient not taking: Reported on 10/22/2020)     pantoprazole (PROTONIX) 40 MG tablet Take 1 tablet (40 mg total) by mouth daily. (Patient not taking: Reported on 10/22/2020)  tamsulosin (FLOMAX) 0.4 MG CAPS capsule Take 1 capsule (0.4 mg total) by mouth daily after supper. (Patient not taking: Reported on 10/22/2020) 180 capsule 0   No current facility-administered medications for this visit.    Allergies-reviewed and updated Allergies  Allergen Reactions   Other Other (See Comments)    Some form of numbing medication used by dentist, almost died   Simvastatin Other (See Comments)    Muscle aches     Social History   Socioeconomic History   Marital status: Married    Spouse name: Not on file   Number of children: 3   Years of education: 16   Highest education level: Not on file  Occupational History   Occupation: [employer]  Tobacco Use   Smoking status: Former    Packs/day: 1.00    Years: 60.00    Pack years: 60.00    Types: Cigarettes    Quit date: 03/25/1998    Years since quitting: 22.5   Smokeless tobacco: Never  Vaping Use   Vaping Use: Never used  Substance and Sexual Activity   Alcohol use: Yes    Alcohol/week: 2.0 - 3.0 standard drinks    Types: 2 - 3 Glasses of wine per week    Comment: 1 glass of red wine every 2-3 night    Drug use: No   Sexual activity: Not on file  Other Topics Concern   Not on file  Social History  Narrative   Fun: Working outside in yard, cutting wood    Denies abuse and feels safe at home.    Right handed   Lives at home with wife, daughter, granddaughter, and son (periodically)   Social Determinants of Health   Financial Resource Strain: Not on file  Food Insecurity: Not on file  Transportation Needs: Not on file  Physical Activity: Not on file  Stress: Not on file  Social Connections: Not on file        Objective:  Physical Exam: BP 107/70   Pulse 77   Temp 97.6 F (36.4 C) (Temporal)   Wt 193 lb 3.2 oz (87.6 kg)   SpO2 95%   BMI 26.20 kg/m   Gen: NAD, resting comfortably CV: RRR with no murmurs appreciated Pulm: NWOB, CTAB with no crackles, wheezes, or rhonchi GI: Normal bowel sounds present. Soft, Nontender, Nondistended. MSK: No edema, cyanosis, or clubbing noted Skin: Warm, dry Neuro: Grossly normal, moves all extremities Psych: Normal affect and thought content       I,Savera Zaman,acting as a scribe for Dimas Chyle, MD.,have documented all relevant documentation on the behalf of Dimas Chyle, MD,as directed by  Dimas Chyle, MD while in the presence of Dimas Chyle, MD.   I, Dimas Chyle, MD, have reviewed all documentation for this visit. The documentation on 10/22/20 for the exam, diagnosis, procedures, and orders are all accurate and complete.  Time Spent: 45 minutes of total time was spent on the date of the encounter performing the following actions: chart review prior to seeing the patient including recent ED visit, obtaining history, performing a medically necessary exam, counseling on the treatment plan, placing orders, and documenting in our EHR.    [name]. [name], MD 10/22/2020 2:48 PM

## 2020-10-22 NOTE — Assessment & Plan Note (Signed)
Hemoglobin is better.  He is only on atenolol 25 mg daily at this point.  We stopped a few of his antihypertensives and tamsulosin was stopped couple of weeks ago.  We will continue home monitoring.

## 2020-10-23 ENCOUNTER — Other Ambulatory Visit: Payer: Self-pay | Admitting: Family Medicine

## 2020-10-23 DIAGNOSIS — R339 Retention of urine, unspecified: Secondary | ICD-10-CM | POA: Diagnosis not present

## 2020-10-23 DIAGNOSIS — I480 Paroxysmal atrial fibrillation: Secondary | ICD-10-CM | POA: Diagnosis not present

## 2020-10-23 DIAGNOSIS — I872 Venous insufficiency (chronic) (peripheral): Secondary | ICD-10-CM | POA: Diagnosis not present

## 2020-10-23 DIAGNOSIS — R351 Nocturia: Secondary | ICD-10-CM | POA: Diagnosis not present

## 2020-10-23 DIAGNOSIS — Z48815 Encounter for surgical aftercare following surgery on the digestive system: Secondary | ICD-10-CM | POA: Diagnosis not present

## 2020-10-23 DIAGNOSIS — R338 Other retention of urine: Secondary | ICD-10-CM | POA: Diagnosis not present

## 2020-10-23 DIAGNOSIS — I2510 Atherosclerotic heart disease of native coronary artery without angina pectoris: Secondary | ICD-10-CM | POA: Diagnosis not present

## 2020-10-23 DIAGNOSIS — N401 Enlarged prostate with lower urinary tract symptoms: Secondary | ICD-10-CM | POA: Diagnosis not present

## 2020-10-23 DIAGNOSIS — K219 Gastro-esophageal reflux disease without esophagitis: Secondary | ICD-10-CM | POA: Diagnosis not present

## 2020-10-23 DIAGNOSIS — I10 Essential (primary) hypertension: Secondary | ICD-10-CM | POA: Diagnosis not present

## 2020-10-26 DIAGNOSIS — N453 Epididymo-orchitis: Secondary | ICD-10-CM | POA: Diagnosis not present

## 2020-10-26 DIAGNOSIS — N401 Enlarged prostate with lower urinary tract symptoms: Secondary | ICD-10-CM | POA: Diagnosis not present

## 2020-10-26 DIAGNOSIS — R351 Nocturia: Secondary | ICD-10-CM | POA: Diagnosis not present

## 2020-10-26 DIAGNOSIS — N138 Other obstructive and reflux uropathy: Secondary | ICD-10-CM | POA: Diagnosis not present

## 2020-10-28 DIAGNOSIS — Z48815 Encounter for surgical aftercare following surgery on the digestive system: Secondary | ICD-10-CM | POA: Diagnosis not present

## 2020-10-28 DIAGNOSIS — R338 Other retention of urine: Secondary | ICD-10-CM | POA: Diagnosis not present

## 2020-10-28 DIAGNOSIS — I2510 Atherosclerotic heart disease of native coronary artery without angina pectoris: Secondary | ICD-10-CM | POA: Diagnosis not present

## 2020-10-28 DIAGNOSIS — I10 Essential (primary) hypertension: Secondary | ICD-10-CM | POA: Diagnosis not present

## 2020-10-28 DIAGNOSIS — I480 Paroxysmal atrial fibrillation: Secondary | ICD-10-CM | POA: Diagnosis not present

## 2020-10-28 DIAGNOSIS — R351 Nocturia: Secondary | ICD-10-CM | POA: Diagnosis not present

## 2020-10-28 DIAGNOSIS — R339 Retention of urine, unspecified: Secondary | ICD-10-CM | POA: Diagnosis not present

## 2020-10-28 DIAGNOSIS — K219 Gastro-esophageal reflux disease without esophagitis: Secondary | ICD-10-CM | POA: Diagnosis not present

## 2020-10-28 DIAGNOSIS — N401 Enlarged prostate with lower urinary tract symptoms: Secondary | ICD-10-CM | POA: Diagnosis not present

## 2020-10-28 DIAGNOSIS — I872 Venous insufficiency (chronic) (peripheral): Secondary | ICD-10-CM | POA: Diagnosis not present

## 2020-11-02 ENCOUNTER — Encounter: Payer: Self-pay | Admitting: Podiatry

## 2020-11-02 ENCOUNTER — Other Ambulatory Visit: Payer: Self-pay

## 2020-11-02 ENCOUNTER — Ambulatory Visit: Payer: Medicare HMO | Admitting: Podiatry

## 2020-11-02 DIAGNOSIS — B351 Tinea unguium: Secondary | ICD-10-CM | POA: Diagnosis not present

## 2020-11-02 DIAGNOSIS — D689 Coagulation defect, unspecified: Secondary | ICD-10-CM

## 2020-11-02 DIAGNOSIS — I872 Venous insufficiency (chronic) (peripheral): Secondary | ICD-10-CM

## 2020-11-02 DIAGNOSIS — M79676 Pain in unspecified toe(s): Secondary | ICD-10-CM

## 2020-11-02 NOTE — Progress Notes (Signed)
This patient returns to my office for at risk foot care.  This patient requires this care by a professional since this patient will be at risk due to having venous insufficiency and coagulation defect.  Patient is taking coumadin.  This patient is unable to cut nails himself since the patient cannot reach his nails.These nails are painful walking and wearing shoes. Painful callus left foot has returned. This patient presents for at risk foot care today.  General Appearance  Alert, conversant and in no acute stress.  Vascular  Dorsalis pedis and posterior tibial  pulses are palpable  bilaterally.  Capillary return is within normal limits  bilaterally. Temperature is within normal limits  bilaterally.  Neurologic  Senn-Weinstein monofilament wire test within normal limits  bilaterally. Muscle power within normal limits bilaterally.  Nails Thick disfigured discolored nails with subungual debris  from hallux to fifth toes bilaterally. No evidence of bacterial infection or drainage bilaterally.   Orthopedic  No limitations of motion  feet .  No crepitus or effusions noted.  No bony pathology or digital deformities noted.  Skin  normotropic skin  noted bilaterally.  No signs of infections or ulcers noted.   Symptomatic porokeratosis sub 5th met left foot.  Onychomycosis  Pain in right toes  Pain in left toes  Porokeratosis left foot.  Consent was obtained for treatment procedures.   Mechanical debridement of nails 1-5  bilaterally performed with a nail nipper.  Filed with dremel without incident. Debride porokeratosis debrided with # 15 blade.   Return office visit   10 weeks                  Told patient to return for periodic foot care and evaluation due to potential at risk complications.   Gardiner Barefoot DPM

## 2020-11-03 ENCOUNTER — Telehealth: Payer: Self-pay | Admitting: Family Medicine

## 2020-11-03 DIAGNOSIS — N401 Enlarged prostate with lower urinary tract symptoms: Secondary | ICD-10-CM | POA: Diagnosis not present

## 2020-11-03 DIAGNOSIS — K219 Gastro-esophageal reflux disease without esophagitis: Secondary | ICD-10-CM | POA: Diagnosis not present

## 2020-11-03 DIAGNOSIS — R338 Other retention of urine: Secondary | ICD-10-CM | POA: Diagnosis not present

## 2020-11-03 DIAGNOSIS — I2510 Atherosclerotic heart disease of native coronary artery without angina pectoris: Secondary | ICD-10-CM | POA: Diagnosis not present

## 2020-11-03 DIAGNOSIS — R339 Retention of urine, unspecified: Secondary | ICD-10-CM | POA: Diagnosis not present

## 2020-11-03 DIAGNOSIS — R351 Nocturia: Secondary | ICD-10-CM | POA: Diagnosis not present

## 2020-11-03 DIAGNOSIS — Z48815 Encounter for surgical aftercare following surgery on the digestive system: Secondary | ICD-10-CM | POA: Diagnosis not present

## 2020-11-03 DIAGNOSIS — I10 Essential (primary) hypertension: Secondary | ICD-10-CM | POA: Diagnosis not present

## 2020-11-03 DIAGNOSIS — I872 Venous insufficiency (chronic) (peripheral): Secondary | ICD-10-CM | POA: Diagnosis not present

## 2020-11-03 DIAGNOSIS — I480 Paroxysmal atrial fibrillation: Secondary | ICD-10-CM | POA: Diagnosis not present

## 2020-11-03 NOTE — Chronic Care Management (AMB) (Signed)
  Care Management  Note   11/03/2020 Name: [name] MRN: [id number] DOB: 1940-06-11  William Norman is a 80 y.o. year old male who is a primary care patient of [name], [name], MD. The care management team was consulted for assistance with chronic disease management and care coordination needs.   Mr. Nahar was given information about Care Management services today including:  CCM service includes personalized support from designated clinical staff supervised by the physician, including individualized plan of care and coordination with other care providers 24/7 contact phone numbers for assistance for urgent and routine care needs. Service will only be billed when office clinical staff spend 20 minutes or more in a month to coordinate care. Only one practitioner may furnish and bill the service in a calendar month. The patient may stop CCM services at amy time (effective at the end of the month) by phone call to the office staff. The patient will be responsible for cost sharing (co-pay) or up to 20% of the service fee (after annual deductible is met)  Patient agreed to services and verbal consent obtained.  Follow up plan:   Face to Face appointment with care management team member scheduled for: 12/22/20 $RemoveBefo'@11am'[email or web address]$   William Norman Upstream Scheduler

## 2020-11-04 DIAGNOSIS — N401 Enlarged prostate with lower urinary tract symptoms: Secondary | ICD-10-CM | POA: Diagnosis not present

## 2020-11-04 DIAGNOSIS — R351 Nocturia: Secondary | ICD-10-CM | POA: Diagnosis not present

## 2020-11-04 DIAGNOSIS — K219 Gastro-esophageal reflux disease without esophagitis: Secondary | ICD-10-CM | POA: Diagnosis not present

## 2020-11-04 DIAGNOSIS — I10 Essential (primary) hypertension: Secondary | ICD-10-CM | POA: Diagnosis not present

## 2020-11-04 DIAGNOSIS — Z48815 Encounter for surgical aftercare following surgery on the digestive system: Secondary | ICD-10-CM | POA: Diagnosis not present

## 2020-11-04 DIAGNOSIS — I480 Paroxysmal atrial fibrillation: Secondary | ICD-10-CM | POA: Diagnosis not present

## 2020-11-04 DIAGNOSIS — I872 Venous insufficiency (chronic) (peripheral): Secondary | ICD-10-CM | POA: Diagnosis not present

## 2020-11-04 DIAGNOSIS — R339 Retention of urine, unspecified: Secondary | ICD-10-CM | POA: Diagnosis not present

## 2020-11-04 DIAGNOSIS — I2510 Atherosclerotic heart disease of native coronary artery without angina pectoris: Secondary | ICD-10-CM | POA: Diagnosis not present

## 2020-11-04 DIAGNOSIS — R338 Other retention of urine: Secondary | ICD-10-CM | POA: Diagnosis not present

## 2020-11-10 ENCOUNTER — Encounter: Payer: Self-pay | Admitting: Cardiology

## 2020-11-10 ENCOUNTER — Other Ambulatory Visit: Payer: Self-pay

## 2020-11-10 ENCOUNTER — Ambulatory Visit: Payer: Medicare HMO | Admitting: Cardiology

## 2020-11-10 VITALS — BP 122/62 | HR 86 | Ht 72.0 in | Wt 203.0 lb

## 2020-11-10 DIAGNOSIS — I4819 Other persistent atrial fibrillation: Secondary | ICD-10-CM | POA: Diagnosis not present

## 2020-11-10 DIAGNOSIS — I5031 Acute diastolic (congestive) heart failure: Secondary | ICD-10-CM | POA: Diagnosis not present

## 2020-11-10 DIAGNOSIS — E782 Mixed hyperlipidemia: Secondary | ICD-10-CM

## 2020-11-10 DIAGNOSIS — I110 Hypertensive heart disease with heart failure: Secondary | ICD-10-CM

## 2020-11-10 DIAGNOSIS — I2510 Atherosclerotic heart disease of native coronary artery without angina pectoris: Secondary | ICD-10-CM

## 2020-11-10 DIAGNOSIS — Z7901 Long term (current) use of anticoagulants: Secondary | ICD-10-CM

## 2020-11-10 MED ORDER — FUROSEMIDE 20 MG PO TABS: 20.0000 mg | ORAL_TABLET | Freq: Every day | ORAL | 3 refills | Status: DC

## 2020-11-10 NOTE — Progress Notes (Signed)
Cardiology Office Note:    Date:  11/10/2020   ID:  William Norman, DOB 1940/09/16, MRN [id number]  PCP:  Vivi Barrack, MD  Cardiologist:  Shirlee More, MD    Referring MD: Vivi Barrack, MD    ASSESSMENT:    1. Hypertensive heart disease with acute diastolic congestive heart failure (HCC)   2. Persistent atrial fibrillation ([place])   3. Long term current use of anticoagulant therapy   4. Mixed hyperlipidemia   5. Coronary artery disease involving native coronary artery of native heart without angina pectoris    PLAN:    In order of problems listed above:  Since hospitalization he is edematous short of breath obviously fluid overloaded started on low-dose loop diuretic 20 mg of furosemide daily recheck renal function week weigh daily at the weight gain of 10 pounds since his last visit and bring that with him the next time he comes the office and we will see him back in 2 to 3 weeks prior to TURP.  Clearly this is a hospital related IV fluid issue.  BP at target continue treatment including his low-dose beta-blocker Stable rate controlled continue beta-blocker and anticoagulant Stable continue with statin Stable having no angina on current medical therapy continue medical treatment beta-blocker statin   Next appointment: 2 to 3 weeks that should be about 1 week prior to his elective TURP   Medication Adjustments/Labs and Tests Ordered: Current medicines are reviewed at length with the patient today.  Concerns regarding medicines are outlined above.  Orders Placed This Encounter  Procedures   Basic metabolic panel    Meds ordered this encounter  Medications   furosemide (LASIX) 20 MG tablet    Sig: Take 1 tablet (20 mg total) by mouth daily.    Dispense:  90 tablet    Refill:  3    Chief complaint follow-up for atrial fibrillation and hypertensive heart disease   History of Present Illness:    William Norman is a 80 y.o. male with a hx of  persistent atrial fibrillation with anticoagulation hypertensive heart disease without heart failure hyperlipidemia and CAD with remote PCI of the LAD in 1992 and a history of deep vein thrombosis on chronic anticoagulation last seen 05/14/2020.  He had acute embolic stroke 56/25/6389 in the setting of anticoagulant withdrawal in preparation for TURP.  He has had recurrent small bowel obstruction and required surgical intervention and IVR drainage of hematoma intra-abdominal.  Compliance with diet, lifestyle and medications: Yes  EKG 10/16/2020 showed atrial fibrillation controlled ventricular rate.  Independently reviewed by me. He was seen in the emergency room at the time of his EKG with a low oxygen sat. Since surgery he has had a weight gain and peripheral edema and is mildly short of breath with activity but fortunately no orthopnea chest pain palpitation or syncope. He voids every few hours straight caths himself at home and plans on having TURP at the end of October. Past Medical History:  Diagnosis Date   BPH (benign prostatic hypertrophy)    BPH associated with nocturia 08/29/2013   BPH with obstruction/lower urinary tract symptoms 08/14/2019   CAD (coronary artery disease)    CAD (coronary artery disease), native coronary artery    PTCA of RCA 1986, PTCA of LAD 1992,  Negative treadmill Cardiolite 2011    Chronic dermatitis of hands 10/12/2015   Chronic venous insufficiency    Coagulation disorder ([place]) 10/26/2018   DVT (deep venous thrombosis) ([place])  Dyslipidemia    Eczema    Erectile dysfunction 07/11/2012   Essential hypertension    GERD 08/04/2006       GERD (gastroesophageal reflux disease)    Hyperglycemia 07/08/2019   Hyperlipidemia    Hypertension    Incomplete emptying of bladder 08/14/2019   Long term current use of anticoagulant therapy    Lumbar disc disease    Overweight (BMI 25.0-29.9) 06/18/2015   Persistent atrial fibrillation ([place]) 04/10/2018    Personal history of DVT (deep vein thrombosis)    Initially in 1998 with recurrence in 2002 now on chronic warfarin    Soft tissue lesion of foot 08/27/2015   Stroke [facility])    Urinary tract infection with hematuria 08/14/2019    Past Surgical History:  Procedure Laterality Date   APPLICATION OF WOUND VAC  09/02/2020   Procedure: APPLICATION OF WOUND VAC;  Surgeon: Donnie Mesa, MD;  Location: [place];  Service: General;;   CARDIAC CATHETERIZATION     CARPAL TUNNEL RELEASE  04/21/2011   Procedure: CARPAL TUNNEL RELEASE;  Surgeon: Tennis Must, MD;  Location: [place];  Service: Orthopedics;  Laterality: Left;   CARPAL TUNNEL RELEASE  05/23/2011   Procedure: CARPAL TUNNEL RELEASE;  Surgeon: Tennis Must, MD;  Location: [place];  Service: Orthopedics;  Laterality: Right;   COLON SURGERY  2010 and 2011 colostomy reversal   CORONARY ANGIOPLASTY     EYE SURGERY     repair of macular hole   LAPAROTOMY N/A 09/02/2020   Procedure: EXPLORATORY LAPAROTOMY, LYSIS OF ADHESIONS ,BOWEL RESECTION;  Surgeon: Donnie Mesa, MD;  Location: [place];  Service: General;  Laterality: N/A;   NASAL SEPTUM SURGERY     precutaneous transluminal coronary angioplasty     right hernia     TONSILLECTOMY AND ADENOIDECTOMY      Current Medications: Current Meds  Medication Sig   acetaminophen (TYLENOL) 650 MG CR tablet Take 650 mg by mouth daily as needed for pain.   apixaban (ELIQUIS) 5 MG TABS tablet Take 1 tablet (5 mg total) by mouth 2 (two) times daily.   atenolol (TENORMIN) 25 MG tablet Take 1 tablet (25 mg total) by mouth daily.   atorvastatin (LIPITOR) 40 MG tablet TAKE 1/2 TABLET BY MOUTH DAILY   Coenzyme Q10 (COQ10) 200 MG CAPS Take 1 capsule by mouth daily.   Cyanocobalamin (VITAMIN B-12) 1000 MCG SUBL Place 1 tablet (1,000 mcg total) under the tongue daily. (Patient taking differently: Take 1 tablet by mouth daily.)   furosemide (LASIX) 20 MG tablet Take 1 tablet (20 mg  total) by mouth daily.   hydroxypropyl methylcellulose / hypromellose (ISOPTO TEARS / GONIOVISC) 2.5 % ophthalmic solution Place 1 drop into both eyes 2 (two) times daily as needed for dry eyes.   nitroGLYCERIN (NITROSTAT) 0.4 MG SL tablet Dissolve 1 tablet under the tongue every 5 minutes as needed   oxymetazoline (AFRIN) 0.05 % nasal spray Place 2 sprays into both nostrils at bedtime as needed for congestion.     Allergies:   Other and Simvastatin   Social History   Socioeconomic History   Marital status: Married    Spouse name: Not on file   Number of children: 3   Years of education: 16   Highest education level: Not on file  Occupational History   Occupation: [employer]  Tobacco Use   Smoking status: Former    Packs/day: 1.00    Years: 60.00    Pack years:  60.00    Types: Cigarettes    Quit date: 03/25/1998    Years since quitting: 22.6   Smokeless tobacco: Never  Vaping Use   Vaping Use: Never used  Substance and Sexual Activity   Alcohol use: Yes    Alcohol/week: 2.0 - 3.0 standard drinks    Types: 2 - 3 Glasses of wine per week    Comment: 1 glass of red wine every 2-3 night    Drug use: No   Sexual activity: Not on file  Other Topics Concern   Not on file  Social History Narrative   Fun: Working outside in yard, cutting wood    Denies abuse and feels safe at home.    Right handed   Lives at home with wife, daughter, granddaughter, and son (periodically)   Social Determinants of Health   Financial Resource Strain: Not on file  Food Insecurity: Not on file  Transportation Needs: Not on file  Physical Activity: Not on file  Stress: Not on file  Social Connections: Not on file     Family History: The patient's family history includes AAA (abdominal aortic aneurysm) in his brother; Atrial fibrillation in his father and mother; Heart attack in his father; Heart disease in his father and mother; Stroke in his mother. ROS:   Please see the history of present illness.     All other systems reviewed and are negative.  EKGs/Labs/Other Studies Reviewed:    The following studies were reviewed today:   Recent Labs: 07/20/2020: TSH 3.18 09/15/2020: Magnesium 2.0 10/02/2020: ALT 79 10/16/2020: BUN 19; Creatinine, Ser 0.78; Hemoglobin 11.6; Platelets 380; Potassium 4.1; Sodium 137  Recent Lipid Panel    Component Value Date/Time   CHOL 124 07/20/2020 1450   CHOL 130 12/03/2019 1509   TRIG 54 09/14/2020 0353   TRIG 68 01/10/2006 1108   HDL 49.60 07/20/2020 1450   HDL 48 12/03/2019 1509   CHOLHDL 2 07/20/2020 1450   VLDL 13.8 07/20/2020 1450   LDLCALC 60 07/20/2020 1450   LDLCALC 66 12/03/2019 1509   LDLDIRECT 131.5 04/22/2010 1018    Physical Exam:    VS:  BP 122/62 (BP Location: Left Arm, Patient Position: Sitting, Cuff Size: Normal)   Pulse 86   Ht 6' (1.829 m)   Wt 203 lb (92.1 kg)   SpO2 97%   BMI 27.53 kg/m     Wt Readings from Last 3 Encounters:  11/10/20 203 lb (92.1 kg)  10/22/20 193 lb 3.2 oz (87.6 kg)  10/07/20 195 lb 3.2 oz (88.5 kg)     GEN: Appears his age well nourished, well developed in no acute distress his weight is up 10 pounds HEENT: Normal NECK: No JVD; No carotid bruits LYMPHATICS: No lymphadenopathy CARDIAC: Irregular rate and rhythm RRR, no murmurs, rubs, gallops RESPIRATORY:  Clear to auscultation without rales, wheezing or rhonchi  ABDOMEN: Soft, non-tender, non-distended MUSCULOSKELETAL: 2-3+ bilateral lower extremity pitting edema; No deformity  SKIN: Warm and dry NEUROLOGIC:  Alert and oriented x 3 PSYCHIATRIC:  Normal affect    Signed, Shirlee More, MD  11/10/2020 1:37 PM    [facility]

## 2020-11-10 NOTE — Patient Instructions (Signed)
Medication Instructions:  Your physician has recommended you make the following change in your medication:  START: Furosemide 20 mg take one tablet by mouth daily.  *If you need a refill on your cardiac medications before your next appointment, please call your pharmacy*   Lab Work: Your physician recommends that you return for lab work in: TODAY BMP If you have labs (blood work) drawn today and your tests are completely normal, you will receive your results only by: [place] (if you have MyChart) OR A paper copy in the mail If you have any lab test that is abnormal or we need to change your treatment, we will call you to review the results.   Testing/Procedures: None   Follow-Up: At [facility], you and your health needs are our priority.  As part of our continuing mission to provide you with exceptional heart care, we have created designated Provider Care Teams.  These Care Teams include your primary Cardiologist (physician) and Advanced Practice Providers (APPs -  Physician Assistants and Nurse Practitioners) who all work together to provide you with the care you need, when you need it.  We recommend signing up for the patient portal called "MyChart".  Sign up information is provided on this After Visit Summary.  MyChart is used to connect with patients for Virtual Visits (Telemedicine).  Patients are able to view lab/test results, encounter notes, upcoming appointments, etc.  Non-urgent messages can be sent to your provider as well.   To learn more about what you can do with MyChart, go to [email or web address].    Your next appointment:   Already scheduled  The format for your next appointment:   In Person  Provider:   Shirlee More, MD   Other Instructions

## 2020-11-11 DIAGNOSIS — K219 Gastro-esophageal reflux disease without esophagitis: Secondary | ICD-10-CM | POA: Diagnosis not present

## 2020-11-11 DIAGNOSIS — N401 Enlarged prostate with lower urinary tract symptoms: Secondary | ICD-10-CM | POA: Diagnosis not present

## 2020-11-11 DIAGNOSIS — R339 Retention of urine, unspecified: Secondary | ICD-10-CM | POA: Diagnosis not present

## 2020-11-11 DIAGNOSIS — R351 Nocturia: Secondary | ICD-10-CM | POA: Diagnosis not present

## 2020-11-11 DIAGNOSIS — Z48815 Encounter for surgical aftercare following surgery on the digestive system: Secondary | ICD-10-CM | POA: Diagnosis not present

## 2020-11-11 DIAGNOSIS — I2510 Atherosclerotic heart disease of native coronary artery without angina pectoris: Secondary | ICD-10-CM | POA: Diagnosis not present

## 2020-11-11 DIAGNOSIS — I10 Essential (primary) hypertension: Secondary | ICD-10-CM | POA: Diagnosis not present

## 2020-11-11 DIAGNOSIS — I872 Venous insufficiency (chronic) (peripheral): Secondary | ICD-10-CM | POA: Diagnosis not present

## 2020-11-11 DIAGNOSIS — I480 Paroxysmal atrial fibrillation: Secondary | ICD-10-CM | POA: Diagnosis not present

## 2020-11-11 DIAGNOSIS — R338 Other retention of urine: Secondary | ICD-10-CM | POA: Diagnosis not present

## 2020-11-12 DIAGNOSIS — I10 Essential (primary) hypertension: Secondary | ICD-10-CM | POA: Diagnosis not present

## 2020-11-12 DIAGNOSIS — N401 Enlarged prostate with lower urinary tract symptoms: Secondary | ICD-10-CM | POA: Diagnosis not present

## 2020-11-12 DIAGNOSIS — Z48815 Encounter for surgical aftercare following surgery on the digestive system: Secondary | ICD-10-CM | POA: Diagnosis not present

## 2020-11-12 DIAGNOSIS — I480 Paroxysmal atrial fibrillation: Secondary | ICD-10-CM | POA: Diagnosis not present

## 2020-11-12 DIAGNOSIS — K219 Gastro-esophageal reflux disease without esophagitis: Secondary | ICD-10-CM | POA: Diagnosis not present

## 2020-11-12 DIAGNOSIS — R338 Other retention of urine: Secondary | ICD-10-CM | POA: Diagnosis not present

## 2020-11-12 DIAGNOSIS — I2510 Atherosclerotic heart disease of native coronary artery without angina pectoris: Secondary | ICD-10-CM | POA: Diagnosis not present

## 2020-11-12 DIAGNOSIS — I872 Venous insufficiency (chronic) (peripheral): Secondary | ICD-10-CM | POA: Diagnosis not present

## 2020-11-12 DIAGNOSIS — R351 Nocturia: Secondary | ICD-10-CM | POA: Diagnosis not present

## 2020-11-12 DIAGNOSIS — R339 Retention of urine, unspecified: Secondary | ICD-10-CM | POA: Diagnosis not present

## 2020-11-17 ENCOUNTER — Other Ambulatory Visit: Payer: Self-pay | Admitting: Emergency Medicine

## 2020-11-17 DIAGNOSIS — E782 Mixed hyperlipidemia: Secondary | ICD-10-CM

## 2020-11-17 DIAGNOSIS — I4819 Other persistent atrial fibrillation: Secondary | ICD-10-CM

## 2020-11-17 DIAGNOSIS — I2510 Atherosclerotic heart disease of native coronary artery without angina pectoris: Secondary | ICD-10-CM | POA: Diagnosis not present

## 2020-11-17 DIAGNOSIS — I110 Hypertensive heart disease with heart failure: Secondary | ICD-10-CM

## 2020-11-17 DIAGNOSIS — I5031 Acute diastolic (congestive) heart failure: Secondary | ICD-10-CM | POA: Diagnosis not present

## 2020-11-17 DIAGNOSIS — Z7901 Long term (current) use of anticoagulants: Secondary | ICD-10-CM | POA: Diagnosis not present

## 2020-11-18 ENCOUNTER — Telehealth: Payer: Self-pay

## 2020-11-18 DIAGNOSIS — R339 Retention of urine, unspecified: Secondary | ICD-10-CM | POA: Diagnosis not present

## 2020-11-18 DIAGNOSIS — R338 Other retention of urine: Secondary | ICD-10-CM | POA: Diagnosis not present

## 2020-11-18 DIAGNOSIS — K219 Gastro-esophageal reflux disease without esophagitis: Secondary | ICD-10-CM | POA: Diagnosis not present

## 2020-11-18 DIAGNOSIS — I872 Venous insufficiency (chronic) (peripheral): Secondary | ICD-10-CM | POA: Diagnosis not present

## 2020-11-18 DIAGNOSIS — I10 Essential (primary) hypertension: Secondary | ICD-10-CM | POA: Diagnosis not present

## 2020-11-18 DIAGNOSIS — R351 Nocturia: Secondary | ICD-10-CM | POA: Diagnosis not present

## 2020-11-18 DIAGNOSIS — N401 Enlarged prostate with lower urinary tract symptoms: Secondary | ICD-10-CM | POA: Diagnosis not present

## 2020-11-18 DIAGNOSIS — I480 Paroxysmal atrial fibrillation: Secondary | ICD-10-CM | POA: Diagnosis not present

## 2020-11-18 DIAGNOSIS — I2510 Atherosclerotic heart disease of native coronary artery without angina pectoris: Secondary | ICD-10-CM | POA: Diagnosis not present

## 2020-11-18 DIAGNOSIS — Z48815 Encounter for surgical aftercare following surgery on the digestive system: Secondary | ICD-10-CM | POA: Diagnosis not present

## 2020-11-18 LAB — BASIC METABOLIC PANEL
BUN/Creatinine Ratio: 20 (ref 10–24)
BUN: 16 mg/dL (ref 8–27)
CO2: 21 mmol/L (ref 20–29)
Calcium: 9.7 mg/dL (ref 8.6–10.2)
Chloride: 105 mmol/L (ref 96–106)
Creatinine, Ser: 0.8 mg/dL (ref 0.76–1.27)
Glucose: 102 mg/dL — ABNORMAL HIGH (ref 70–99)
Potassium: 4.6 mmol/L (ref 3.5–5.2)
Sodium: 141 mmol/L (ref 134–144)
eGFR: 89 mL/min/{1.73_m2} (ref >59)

## 2020-11-18 NOTE — Telephone Encounter (Signed)
Left message on patients voicemail to please return our call.   

## 2020-11-18 NOTE — Telephone Encounter (Signed)
Spoke with patient regarding results and recommendation.  Patient verbalizes understanding and is agreeable to plan of care. Advised patient to call back with any issues or concerns.  

## 2020-11-18 NOTE — Telephone Encounter (Signed)
-----   Message from Richardo Priest, MD sent at 11/18/2020  9:59 AM EDT ----- Regarding: FW: Normal ----- Message ----- From: Lavone Neri Lab Results In Sent: 11/18/2020   5:38 AM EDT To: Richardo Priest, MD

## 2020-11-18 NOTE — Telephone Encounter (Signed)
  Patient returning call. Would like call back on Mobile number.

## 2020-11-20 ENCOUNTER — Ambulatory Visit (INDEPENDENT_AMBULATORY_CARE_PROVIDER_SITE_OTHER): Payer: Medicare HMO | Admitting: Family Medicine

## 2020-11-20 ENCOUNTER — Other Ambulatory Visit: Payer: Self-pay

## 2020-11-20 ENCOUNTER — Other Ambulatory Visit: Payer: Self-pay | Admitting: Family Medicine

## 2020-11-20 ENCOUNTER — Encounter: Payer: Self-pay | Admitting: Family Medicine

## 2020-11-20 VITALS — BP 109/69 | HR 78 | Temp 97.8°F | Ht 72.0 in | Wt 202.0 lb

## 2020-11-20 DIAGNOSIS — R739 Hyperglycemia, unspecified: Secondary | ICD-10-CM | POA: Diagnosis not present

## 2020-11-20 DIAGNOSIS — I10 Essential (primary) hypertension: Secondary | ICD-10-CM | POA: Diagnosis not present

## 2020-11-20 DIAGNOSIS — I872 Venous insufficiency (chronic) (peripheral): Secondary | ICD-10-CM

## 2020-11-20 DIAGNOSIS — M1990 Unspecified osteoarthritis, unspecified site: Secondary | ICD-10-CM | POA: Insufficient documentation

## 2020-11-20 MED ORDER — ACETAMINOPHEN ER 650 MG PO TBCR
650.0000 mg | EXTENDED_RELEASE_TABLET | Freq: Three times a day (TID) | ORAL | 3 refills | Status: DC | PRN
Start: 2020-11-20 — End: 2022-08-15

## 2020-11-20 NOTE — Assessment & Plan Note (Signed)
Last A1c 5.9.  Discussed lifestyle modifications.

## 2020-11-20 NOTE — Progress Notes (Signed)
   William Norman is a 80 y.o. male who presents today for an office visit.  Assessment/Plan:  Chronic Problems Addressed Today: Essential hypertension At goal.  Continue atenolol 25 mg daily.  Chronic venous insufficiency Swelling in lower extremities is improving with furosemide.  We will continue management per cardiology.  Normal renal function on recent labs.  Normal echocardiogram earlier this year.  Osteoarthritis Uses over-the-counter Tylenol as needed.  We will send in prescription today per patient request.  Hyperglycemia Last A1c 5.9.  Discussed lifestyle modifications.     Subjective:  HPI:  See A/p status of chronic conditions.  Recently saw cardiology for swelling in lower extremities.  Started on furosemide.  Symptoms seem to be improving.       Objective:  Physical Exam: BP 109/69   Pulse 78   Temp 97.8 F (36.6 C) (Temporal)   Ht 6' (1.829 m)   Wt 202 lb (91.6 kg)   SpO2 98%   BMI 27.40 kg/m   Gen: No acute distress, resting comfortably CV: Regular rate and rhythm with no murmurs appreciated Pulm: Normal work of breathing, clear to auscultation bilaterally with no crackles, wheezes, or rhonchi MSK: 1+ to trace pretibial edema on bilateral lower extremities. Neuro: Grossly normal, moves all extremities Psych: Normal affect and thought content      Stratton Villwock M. [name], MD 11/20/2020 2:53 PM

## 2020-11-20 NOTE — Assessment & Plan Note (Signed)
Uses over-the-counter Tylenol as needed.  We will send in prescription today per patient request.

## 2020-11-20 NOTE — Assessment & Plan Note (Signed)
Swelling in lower extremities is improving with furosemide.  We will continue management per cardiology.  Normal renal function on recent labs.  Normal echocardiogram earlier this year.

## 2020-11-20 NOTE — Assessment & Plan Note (Signed)
At goal.  Continue atenolol 25 mg daily.

## 2020-11-20 NOTE — Patient Instructions (Addendum)
It was very nice to see you today!  We will keep an eye on your blood sugar.  I will send Tylenol into your pharmacy.  I think you will do great with your TURP.  We will see you back next summer for your annual checkup with labs.  Please come back to see [place] sooner if needed.  Take care, Dr [name]  PLEASE NOTE:  If you had any lab tests please let us know if you have not heard back within a few days. You may see your results on mychart before we have a chance to review them but we will give you a call once they are reviewed by [place]. If we ordered any referrals today, please let us know if you have not heard from their office within the next week.   Please try these tips to maintain a healthy lifestyle:  Eat at least 3 REAL meals and 1-2 snacks per day.  Aim for no more than 5 hours between eating.  If you eat breakfast, please do so within one hour of getting up.   Each meal should contain half fruits/vegetables, one quarter protein, and one quarter carbs (no bigger than a computer mouse)  Cut down on sweet beverages. This includes juice, soda, and sweet tea.   Drink at least 1 glass of water with each meal and aim for at least 8 glasses per day  Exercise at least 150 minutes every week.

## 2020-11-24 ENCOUNTER — Telehealth: Payer: Self-pay

## 2020-11-24 NOTE — Progress Notes (Signed)
Cardiology Office Note:    Date:  11/25/2020   ID:  William Norman, DOB [birth year], MRN [id number]  PCP:  Vivi Barrack, MD  Cardiologist:  Shirlee More, MD    Referring MD: Vivi Barrack, MD    ASSESSMENT:    1. Hypertensive heart disease with chronic diastolic congestive heart failure (HCC)   2. Persistent atrial fibrillation ([place])   3. Long term current use of anticoagulant therapy   4. Coronary artery disease involving native coronary artery of native heart without angina pectoris    PLAN:    In order of problems listed above:  He is improved volume overload from hospitalization is resolved heart failure is compensated New [place] Heart Association class I continue on a loop diuretic along with his beta-blocker. Stable rate controlled continue beta-blocker current anticoagulant no need to withdraw for his GU surgery Stable CAD having no angina current treatment he is also on a high intensity statin.  He does not require an ischemia evaluation prior to elective GU surgery   Next appointment: 3 month   Medication Adjustments/Labs and Tests Ordered: Current medicines are reviewed at length with the patient today.  Concerns regarding medicines are outlined above.  No orders of the defined types were placed in this encounter.  No orders of the defined types were placed in this encounter.  Chief complaint follow-up for heart failure was started on loop diuretic   History of Present Illness:    William Norman is a 80 y.o. male with a hx of persistent atrial fibrillation with chronic anticoagulation hypertensive heart disease with heart failure hyperlipidemia CAD with remote PCI of the LAD in 1992 history of deep vein thrombosis and acute embolic stroke January 7893 with chronic anticoagulant therapy and recurrent small bowel obstruction with surgical intervention IVR drainage of hematoma last seen 11/10/2020 with fluid overload and decompensated heart  failure.. Compliance with diet, lifestyle and medications: Yes  He has done well with the diuretic is resolved edema shortness of breath strength and endurance are good and he feels back to normal after his recent complex hospitalization.  Renal function potassium are stable on the diuretic He no longer weighs himself daily I asked him to start doing it again No bleeding from his anticoagulant anticipates TURP January I think he is ready for the procedure Past Medical History:  Diagnosis Date   BPH (benign prostatic hypertrophy)    BPH associated with nocturia 08/29/2013   BPH with obstruction/lower urinary tract symptoms 08/14/2019   CAD (coronary artery disease)    CAD (coronary artery disease), native coronary artery    PTCA of RCA 1986, PTCA of LAD 1992,  Negative treadmill Cardiolite 2011    Chronic dermatitis of hands 10/12/2015   Chronic venous insufficiency    Coagulation disorder ([place]) 10/26/2018   DVT (deep venous thrombosis) ([place])    Dyslipidemia    Eczema    Erectile dysfunction 07/11/2012   Essential hypertension    GERD 08/04/2006       GERD (gastroesophageal reflux disease)    Hyperglycemia 07/08/2019   Hyperlipidemia    Hypertension    Incomplete emptying of bladder 08/14/2019   Long term current use of anticoagulant therapy    Lumbar disc disease    Overweight (BMI 25.0-29.9) 06/18/2015   Persistent atrial fibrillation ([place]) 04/10/2018   Personal history of DVT (deep vein thrombosis)    Initially in 1998 with recurrence in 2002 now on chronic warfarin    Soft tissue lesion  of foot 08/27/2015   Stroke [facility])    Urinary tract infection with hematuria 08/14/2019    Past Surgical History:  Procedure Laterality Date   APPLICATION OF WOUND VAC  09/02/2020   Procedure: APPLICATION OF WOUND VAC;  Surgeon: Donnie Mesa, MD;  Location: [place];  Service: General;;   CARDIAC CATHETERIZATION     CARPAL TUNNEL RELEASE  04/21/2011   Procedure: CARPAL TUNNEL RELEASE;   Surgeon: Tennis Must, MD;  Location: [place];  Service: Orthopedics;  Laterality: Left;   CARPAL TUNNEL RELEASE  05/23/2011   Procedure: CARPAL TUNNEL RELEASE;  Surgeon: Tennis Must, MD;  Location: [place];  Service: Orthopedics;  Laterality: Right;   COLON SURGERY  2010 and 2011 colostomy reversal   CORONARY ANGIOPLASTY     EYE SURGERY     repair of macular hole   LAPAROTOMY N/A 09/02/2020   Procedure: EXPLORATORY LAPAROTOMY, LYSIS OF ADHESIONS ,BOWEL RESECTION;  Surgeon: Donnie Mesa, MD;  Location: [place];  Service: General;  Laterality: N/A;   NASAL SEPTUM SURGERY     precutaneous transluminal coronary angioplasty     right hernia     TONSILLECTOMY AND ADENOIDECTOMY      Current Medications: Current Meds  Medication Sig   acetaminophen (ACETAMINOPHEN 8 HOUR) 650 MG CR tablet Take 1 tablet (650 mg total) by mouth every 8 (eight) hours as needed for pain.   apixaban (ELIQUIS) 5 MG TABS tablet Take 1 tablet (5 mg total) by mouth 2 (two) times daily.   atenolol (TENORMIN) 25 MG tablet Take 1 tablet (25 mg total) by mouth daily.   atorvastatin (LIPITOR) 40 MG tablet TAKE 1/2 TABLET BY MOUTH DAILY   Coenzyme Q10 (COQ10) 200 MG CAPS Take 1 capsule by mouth daily.   Cyanocobalamin (VITAMIN B-12) 1000 MCG SUBL Place 1 tablet (1,000 mcg total) under the tongue daily. (Patient taking differently: Take 1 tablet by mouth daily.)   furosemide (LASIX) 20 MG tablet Take 1 tablet (20 mg total) by mouth daily.   hydroxypropyl methylcellulose / hypromellose (ISOPTO TEARS / GONIOVISC) 2.5 % ophthalmic solution Place 1 drop into both eyes 2 (two) times daily as needed for dry eyes.   nitroGLYCERIN (NITROSTAT) 0.4 MG SL tablet Dissolve 1 tablet under the tongue every 5 minutes as needed (Patient taking differently: Dissolve 1 tablet under the tongue every 5 minutes as needed for chest pain)   oxymetazoline (AFRIN) 0.05 % nasal spray Place 2 sprays into both nostrils at  bedtime as needed for congestion.     Allergies:   Other and Simvastatin   Social History   Socioeconomic History   Marital status: Married    Spouse name: Not on file   Number of children: 3   Years of education: 16   Highest education level: Not on file  Occupational History   Occupation: [employer]  Tobacco Use   Smoking status: Former    Packs/day: 1.00    Years: 60.00    Pack years: 60.00    Types: Cigarettes    Quit date: 03/25/1998    Years since quitting: 22.6   Smokeless tobacco: Never  Vaping Use   Vaping Use: Never used  Substance and Sexual Activity   Alcohol use: Yes    Alcohol/week: 2.0 - 3.0 standard drinks    Types: 2 - 3 Glasses of wine per week    Comment: 1 glass of red wine every 2-3 night    Drug use: No  Sexual activity: Not on file  Other Topics Concern   Not on file  Social History Narrative   Fun: Working outside in yard, cutting wood    Denies abuse and feels safe at home.    Right handed   Lives at home with wife, daughter, granddaughter, and son (periodically)   Social Determinants of Health   Financial Resource Strain: Not on file  Food Insecurity: Not on file  Transportation Needs: Not on file  Physical Activity: Not on file  Stress: Not on file  Social Connections: Not on file     Family History: The patient's family history includes AAA (abdominal aortic aneurysm) in his brother; Atrial fibrillation in his father and mother; Heart attack in his father; Heart disease in his father and mother; Stroke in his mother. ROS:   Please see the history of present illness.    All other systems reviewed and are negative.  EKGs/Labs/Other Studies Reviewed:    The following studies were reviewed today:   Recent Labs: 07/20/2020: TSH 3.18 09/15/2020: Magnesium 2.0 10/02/2020: ALT 79 10/16/2020: Hemoglobin 11.6; Platelets 380 11/17/2020: BUN 16; Creatinine, Ser 0.80; Potassium 4.6; Sodium 141  Recent Lipid Panel    Component Value Date/Time    CHOL 124 07/20/2020 1450   CHOL 130 12/03/2019 1509   TRIG 54 09/14/2020 0353   TRIG 68 01/10/2006 1108   HDL 49.60 07/20/2020 1450   HDL 48 12/03/2019 1509   CHOLHDL 2 07/20/2020 1450   VLDL 13.8 07/20/2020 1450   LDLCALC 60 07/20/2020 1450   LDLCALC 66 12/03/2019 1509   LDLDIRECT 131.5 04/22/2010 1018    Physical Exam:    VS:  BP (!) 154/88 (BP Location: Right Arm, Patient Position: Sitting, Cuff Size: Normal)   Pulse 76   Ht 6' (1.829 m)   Wt 200 lb (90.7 kg)   SpO2 97%   BMI 27.12 kg/m     Wt Readings from Last 3 Encounters:  11/25/20 200 lb (90.7 kg)  11/20/20 202 lb (91.6 kg)  11/10/20 203 lb (92.1 kg)     GEN:  Well nourished, well developed in no acute distress HEENT: Normal NECK: No JVD; No carotid bruits LYMPHATICS: No lymphadenopathy CARDIAC: RRR, no murmurs, rubs, gallops RESPIRATORY:  Clear to auscultation without rales, wheezing or rhonchi  ABDOMEN: Soft, non-tender, non-distended MUSCULOSKELETAL:  No edema; No deformity  SKIN: Warm and dry NEUROLOGIC:  Alert and oriented x 3 PSYCHIATRIC:  Normal affect    Signed, Shirlee More, MD  11/25/2020 10:31 AM    [place]

## 2020-11-24 NOTE — Telephone Encounter (Signed)
Patient brought list of weight for 7 days following start of Furosemide for Dr Bettina Gavia to review. List given to Dr Bettina Gavia.

## 2020-11-25 ENCOUNTER — Other Ambulatory Visit: Payer: Self-pay

## 2020-11-25 ENCOUNTER — Encounter: Payer: Self-pay | Admitting: Cardiology

## 2020-11-25 ENCOUNTER — Ambulatory Visit: Payer: Medicare HMO | Admitting: Cardiology

## 2020-11-25 VITALS — BP 154/88 | HR 76 | Ht 72.0 in | Wt 200.0 lb

## 2020-11-25 DIAGNOSIS — I110 Hypertensive heart disease with heart failure: Secondary | ICD-10-CM

## 2020-11-25 DIAGNOSIS — I5032 Chronic diastolic (congestive) heart failure: Secondary | ICD-10-CM

## 2020-11-25 DIAGNOSIS — I2510 Atherosclerotic heart disease of native coronary artery without angina pectoris: Secondary | ICD-10-CM | POA: Diagnosis not present

## 2020-11-25 DIAGNOSIS — I4819 Other persistent atrial fibrillation: Secondary | ICD-10-CM

## 2020-11-25 DIAGNOSIS — Z7901 Long term (current) use of anticoagulants: Secondary | ICD-10-CM | POA: Diagnosis not present

## 2020-11-25 NOTE — Patient Instructions (Signed)

## 2020-12-09 ENCOUNTER — Ambulatory Visit: Payer: Medicare HMO | Admitting: Family Medicine

## 2020-12-09 ENCOUNTER — Other Ambulatory Visit: Payer: Self-pay

## 2020-12-09 VITALS — BP 152/92 | HR 87 | Ht 72.0 in | Wt 203.4 lb

## 2020-12-09 DIAGNOSIS — R29898 Other symptoms and signs involving the musculoskeletal system: Secondary | ICD-10-CM

## 2020-12-09 DIAGNOSIS — M79605 Pain in left leg: Secondary | ICD-10-CM

## 2020-12-09 DIAGNOSIS — M545 Low back pain, unspecified: Secondary | ICD-10-CM | POA: Diagnosis not present

## 2020-12-09 NOTE — Progress Notes (Signed)
I, Peterson Lombard, LAT, ATC acting as a scribe for Lynne Leader, MD.  William Norman is a 80 y.o. male who presents to [place] at [facility] today for LBP and pain/numbness in L foot. Of note aneurysm was incidentally seen on x-ray evaluated with [place] and PCP was notified. Pt was last seen by Dr. Georgina Snell on 04/08/20 for LBP and was referred to PT, completing 5 visits. Today, pt reports worsening of LBP. Pt notes pain seems to be more on the R side w/ radicular symptoms over entire L leg w/ numbness in his L foot.  Dx imaging: 02/26/20 L-spine XR             02/15/07 L-spine XR   Pertinent review of systems: no fever or chills  Relevant historical information: Was hospitalized in July for a bowel obstruction that required laparotomy and bowel removal complicated by a postop hematoma.  Was ultimately discharged to skilled nursing facility.  Currently at home. Additionally urinary retention, atrial fibrillation on Eliquis.   Exam:  BP (!) 152/92   Pulse 87   Ht 6' (1.829 m)   Wt 203 lb 6.4 oz (92.3 kg)   SpO2 97%   BMI 27.59 kg/m  General: Well Developed, well nourished, and in no acute distress.   MSK: L-spine nontender midline.  Decreased lumbar motion. Positive left-sided slump test. Lower extremity strength is intact with exception of left foot dorsiflexion which is limited 4/5. Antalgic gait.    Lab and Radiology Results  X-ray images L-spine visible on CT scan abdomen and pelvis from July 2022 personally and independently interpreted today. Diffuse degenerative changes without acute fractures.     Assessment and Plan: 80 y.o. male with left low back pain with left lumbar radiculopathy thought to be L5 dermatomal pattern with left foot dorsiflexion weakness again consistent with L4 or L5.  This is an ongoing problem since January of this year now with exacerbation and progressive neurologic symptoms.  This is probably compounded by his hospitalization  where he experienced significant core weakness and muscle atrophy.  Plan to proceed directly to MRI to further characterize the cause of his back pain, radiating leg pain, and weakness to left foot dorsiflexion.  Additionally refer to physical therapy.  Recheck after MRI.  We will hold off on gabapentin as I fear will cause confusion.  Again try to hold off on prednisone if possible although may need to use it.  Ultimately likely proceed to epidural steroid injection although will need to stop Eliquis for that.  PDMP not reviewed this encounter. Orders Placed This Encounter  Procedures   MR Lumbar Spine Wo Contrast    Standing Status:   Future    Standing Expiration Date:   12/09/2021    Order Specific Question:   What is the patient's sedation requirement?    Answer:   No Sedation    Order Specific Question:   Does the patient have a pacemaker or implanted devices?    Answer:   No    Order Specific Question:   Preferred imaging location?    Answer:   [facility] (table limit-350lbs)   Ambulatory referral to Physical Therapy    Referral Priority:   Routine    Referral Type:   Physical Medicine    Referral Reason:   Specialty Services Required    Requested Specialty:   Physical Therapy    Number of Visits Requested:   1   No orders of the  defined types were placed in this encounter.    Discussed warning signs or symptoms. Please see discharge instructions. Patient expresses understanding.   The above documentation has been reviewed and is accurate and complete Lynne Leader, M.D.  Total encounter time 40 minutes including face-to-face time with the patient and, reviewing past medical record, and charting on the date of service.   Significant chart review.  Discussion treatment plan and options

## 2020-12-09 NOTE — Patient Instructions (Addendum)
Thank you for coming in today.   You should hear from MRI scheduling within 1 week. If you do not hear please let me know.    I've referred you to Physical Therapy.  Let us know if you don't hear from them in one week.   Check back after MRI is done

## 2020-12-15 ENCOUNTER — Telehealth: Payer: Self-pay | Admitting: Family Medicine

## 2020-12-15 NOTE — Telephone Encounter (Signed)
Pt came by the office today. His MRI is authorized and I gave him [facility]'s phone for scheduling.  Pt back pain has increased since last week. He cannot sleep, is afraid to start PT, and the Tylenol is not helping a bit.  Feels he needs some type of pain relief medicine. [facility] [address].

## 2020-12-16 MED ORDER — HYDROCODONE-ACETAMINOPHEN 5-325 MG PO TABS: 1.0000 | ORAL_TABLET | Freq: Four times a day (QID) | ORAL | 0 refills | Status: DC | PRN

## 2020-12-16 NOTE — Telephone Encounter (Signed)
Called pt and left detailed message on VM that hydrocodone rx has been sent to [facility] on [address].

## 2020-12-16 NOTE — Telephone Encounter (Signed)
Hydrocodone sent to [facility] on [address]

## 2020-12-20 ENCOUNTER — Ambulatory Visit (INDEPENDENT_AMBULATORY_CARE_PROVIDER_SITE_OTHER): Payer: Medicare HMO

## 2020-12-20 ENCOUNTER — Other Ambulatory Visit: Payer: Self-pay

## 2020-12-20 DIAGNOSIS — M79605 Pain in left leg: Secondary | ICD-10-CM | POA: Diagnosis not present

## 2020-12-20 DIAGNOSIS — M545 Low back pain, unspecified: Secondary | ICD-10-CM | POA: Diagnosis not present

## 2020-12-20 IMAGING — MR MR LUMBAR SPINE W/O CM
4 of 5 series · 27 of 48 positions shown · non-contrast
Comparison: None.

CLINICAL DATA: Low back pain for over 6 weeks

EXAM:
MRI LUMBAR SPINE WITHOUT CONTRAST
TECHNIQUE: Multiplanar, multisequence MR imaging of the lumbar spine was
performed. No intravenous contrast was administered.

[Series 2: T2 · sagittal · 4.0mm · 0.81mm/px · 6 of 15 slices shown (1 of 2)]
[im 1/15]
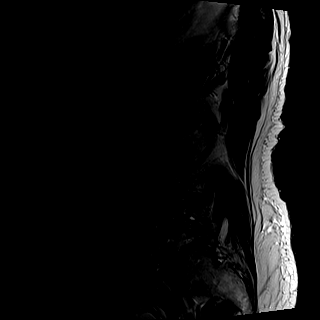
[im 3/15]
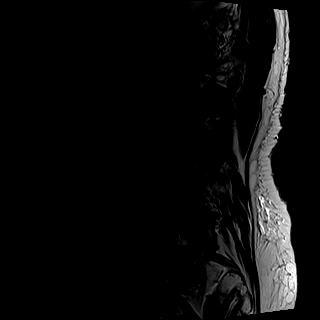
[im 6/15]
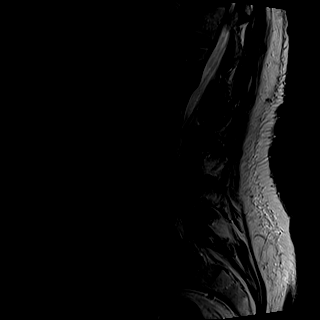
[im 9/15]
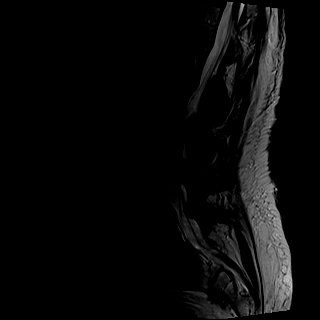
[im 12/15]
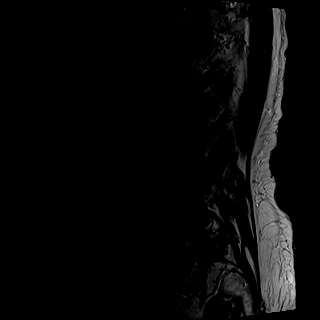
[im 15/15]
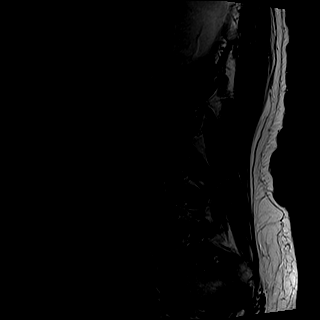

[Series 3: T1 · sagittal · 4.0mm · 0.41mm/px · 5 of 15 slices shown (1 of 2)]
[im 1/15]
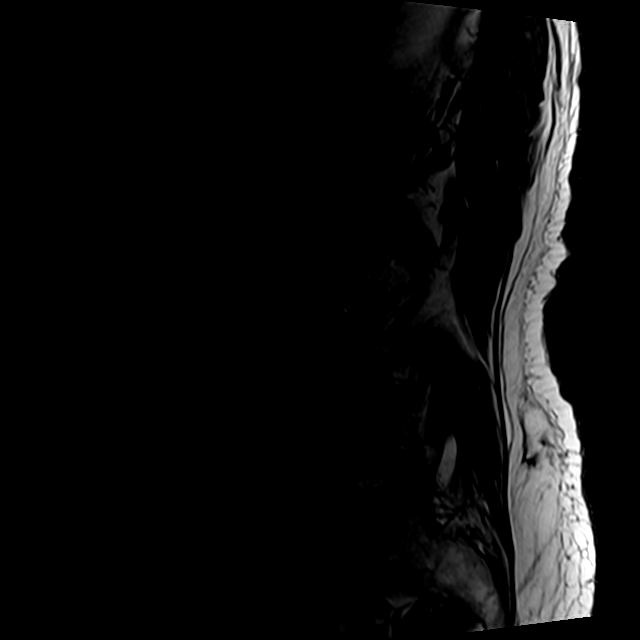
[im 4/15]
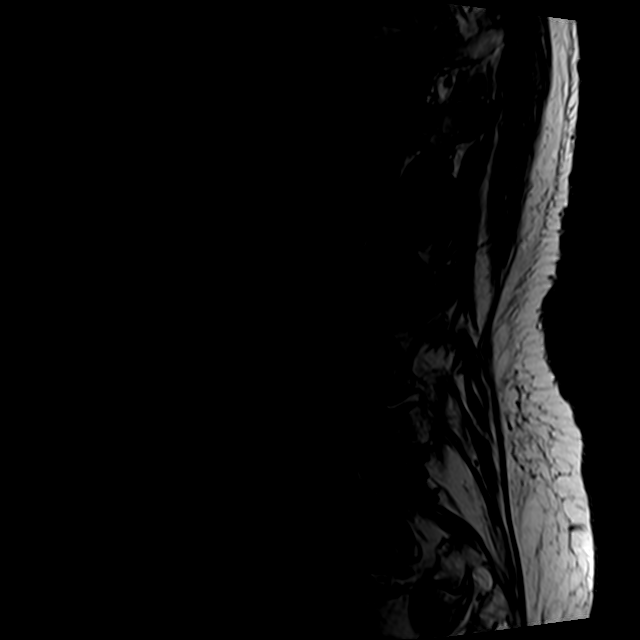
[im 8/15]
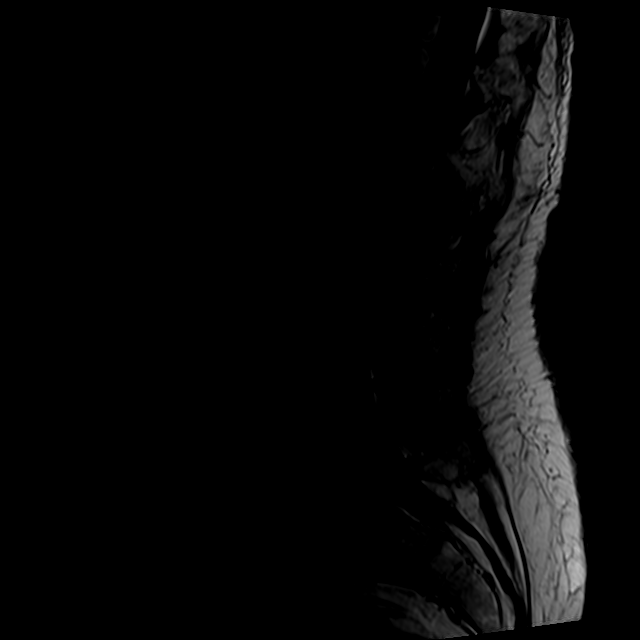
[im 11/15]
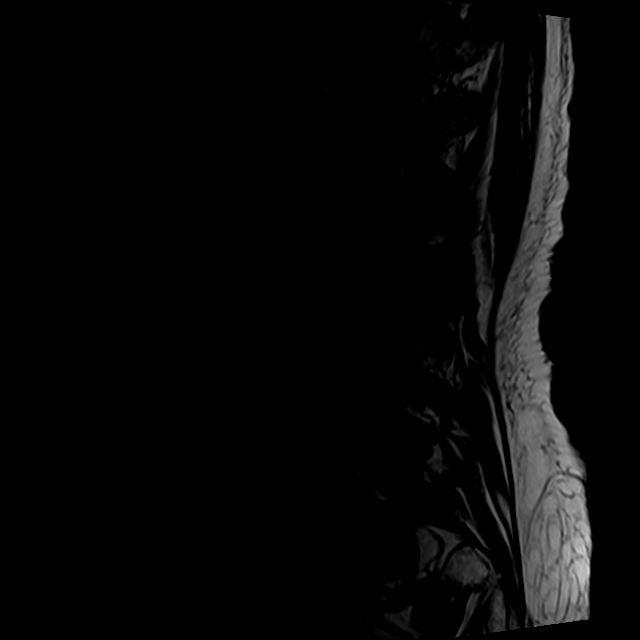
[im 15/15]
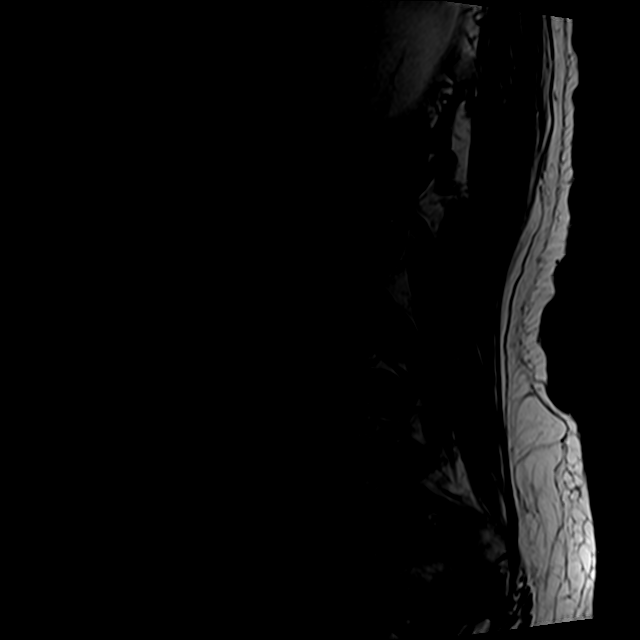

[Series 5: T2 · axial · 4.0mm · 0.78mm/px · z∈[-96,+130]mm · 10 of 43 slices shown (2 of 2)]
[im 3/43]
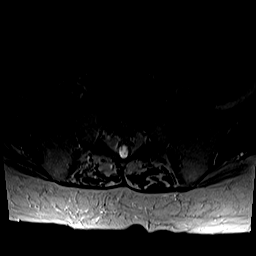
[im 6/43]
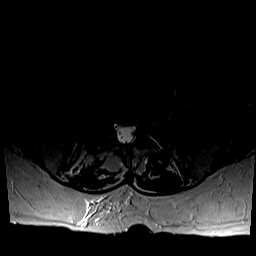
[im 9/43]
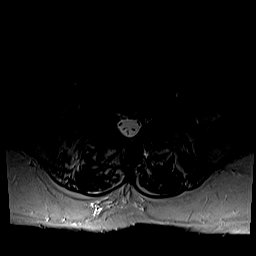
[im 15/43]
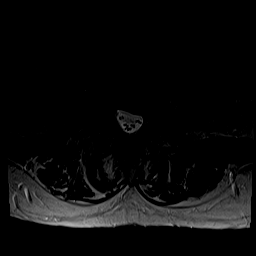
[im 20/43]
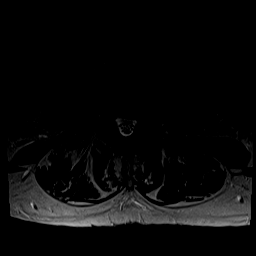
[im 23/43]
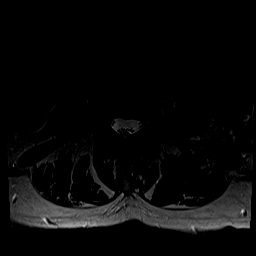
[im 26/43]
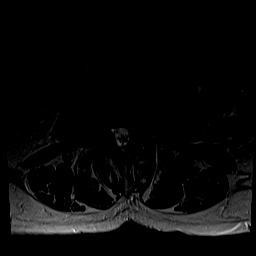
[im 31/43]
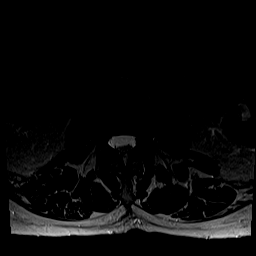
[im 37/43]
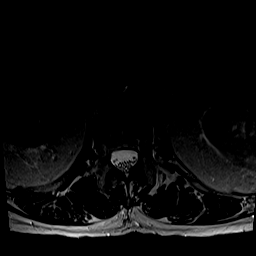
[im 43/43]
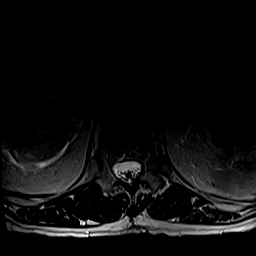

[Series 6: T1 · axial · 4.0mm · 0.39mm/px · z∈[-96,+100]mm · 6 of 43 slices shown (2 of 2)]
[im 3/43]
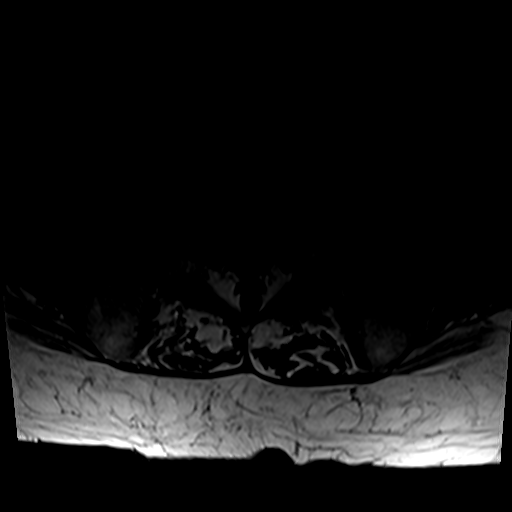
[im 6/43]
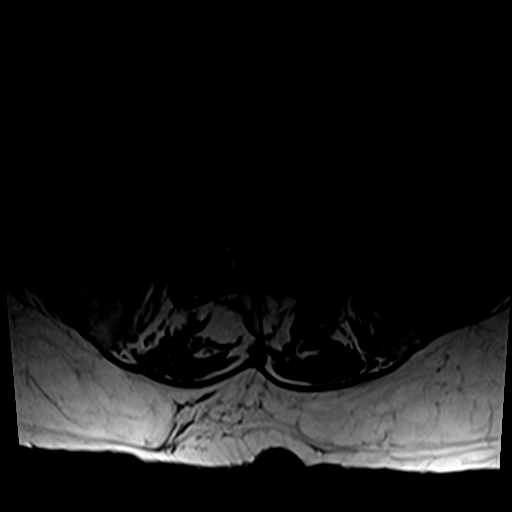
[im 9/43]
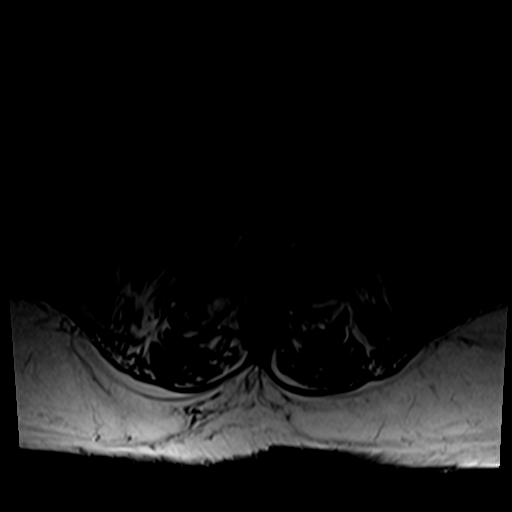
[im 15/43]
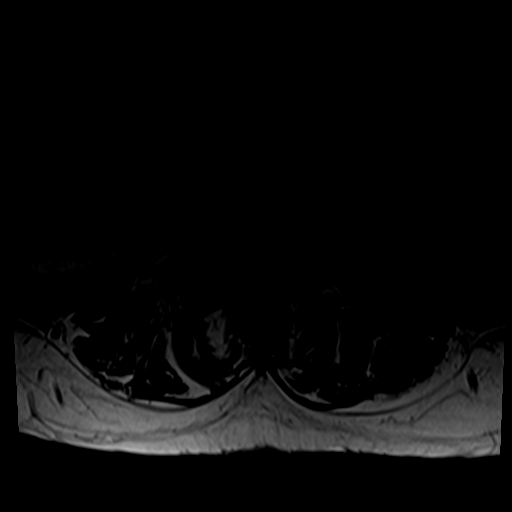
[im 23/43]
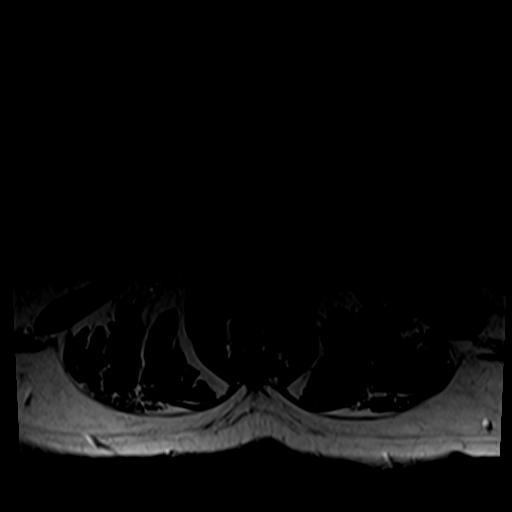
[im 37/43]
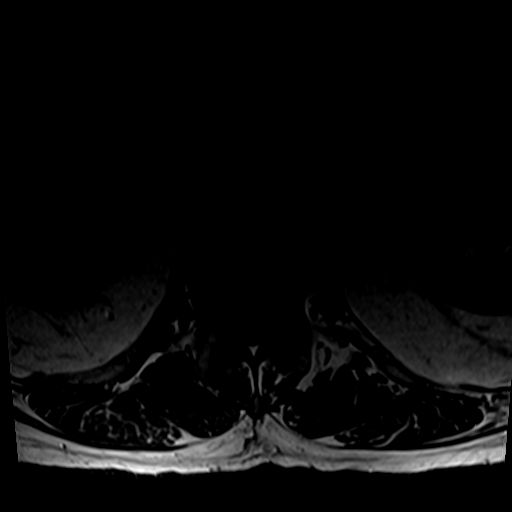

[27 of 48 positions shown; findings below may reference images not displayed]

FINDINGS: Segmentation:  5 lumbar type vertebrae

Alignment:  L4-5 grade 1 anterolisthesis.  Mild scoliosis

Vertebrae:  No fracture, evidence of discitis, or bone lesion.

Conus medullaris and cauda equina: Conus extends to the T12-L1
level. Conus appears somewhat gracile but this may be related to
slice selection and minimal coverage. Negative cauda equina.

Paraspinal and other soft tissues: Bladder wall thickening with
trabeculation, cellules, diverticula.

Disc levels:

T12- L1: Ventral spondylosis

L1-L2: Disc narrowing and bulging with endplate spurring.

L2-L3: Disc narrowing and bulging with left foraminal protrusion.
Asymmetric left facet spurring. Left foraminal impingement. Left
subarticular recess narrowing although no definite L3 compression

L3-L4: Disc narrowing and bulging. Degenerative facet spurring and
ligamentum flavum thickening. Advanced spinal stenosis. Moderate
bilateral foraminal impingement

L4-L5: Disc narrowing and bulging. There is asymmetric right-sided
height loss and ridging. Facet osteoarthritis with severe
degeneration asymmetric to the right, with anterolisthesis.
Triangular narrowing of the thecal sac crowding the bilateral L5
nerve roots. Advanced right foraminal impingement

L5-S1:Facet osteoarthritis with spurring and left anterior synovial
cyst measuring 4 mm. This cyst is noncompressive. Mild disc space
narrowing.
IMPRESSION: 1. Generalized spinal degeneration with scoliosis and L4-5
anterolisthesis.
2. Foraminal impingement on the left at L2-3, L3-4 and right L3-4,
L4-5.
3. Spinal stenosis that is advanced at L3-4 and mild to moderate at
L4-5.
4. Incidental findings of chronic bladder outlet obstruction.

## 2020-12-22 ENCOUNTER — Ambulatory Visit: Payer: Medicare HMO | Admitting: Family Medicine

## 2020-12-22 ENCOUNTER — Ambulatory Visit (INDEPENDENT_AMBULATORY_CARE_PROVIDER_SITE_OTHER): Payer: Medicare HMO | Admitting: Pharmacist

## 2020-12-22 ENCOUNTER — Encounter: Payer: Self-pay | Admitting: Family Medicine

## 2020-12-22 ENCOUNTER — Other Ambulatory Visit: Payer: Self-pay

## 2020-12-22 VITALS — BP 126/74 | HR 52 | Ht 72.0 in | Wt 207.2 lb

## 2020-12-22 DIAGNOSIS — I25118 Atherosclerotic heart disease of native coronary artery with other forms of angina pectoris: Secondary | ICD-10-CM

## 2020-12-22 DIAGNOSIS — M545 Low back pain, unspecified: Secondary | ICD-10-CM

## 2020-12-22 DIAGNOSIS — R739 Hyperglycemia, unspecified: Secondary | ICD-10-CM

## 2020-12-22 DIAGNOSIS — M79605 Pain in left leg: Secondary | ICD-10-CM | POA: Diagnosis not present

## 2020-12-22 DIAGNOSIS — R29898 Other symptoms and signs involving the musculoskeletal system: Secondary | ICD-10-CM | POA: Diagnosis not present

## 2020-12-22 DIAGNOSIS — E785 Hyperlipidemia, unspecified: Secondary | ICD-10-CM

## 2020-12-22 DIAGNOSIS — I4819 Other persistent atrial fibrillation: Secondary | ICD-10-CM

## 2020-12-22 DIAGNOSIS — I10 Essential (primary) hypertension: Secondary | ICD-10-CM

## 2020-12-22 MED ORDER — HYDROCODONE-ACETAMINOPHEN 5-325 MG PO TABS: 1.0000 | ORAL_TABLET | Freq: Four times a day (QID) | ORAL | 0 refills | Status: DC | PRN

## 2020-12-22 NOTE — Progress Notes (Signed)
I, Wendy Poet, LAT, ATC, am serving as scribe for Dr. Lynne Leader.  William Norman is a 80 y.o. male who presents to [place] at [facility] today for LBP and pain/numbness in L foot and L-spine MRI review. Of note aneurysm was incidentally seen on x-ray evaluated with [place] and PCP was notified. Pt was last seen by Dr. Georgina Snell on 12/09/20 and was advised to proceed to MRI. Today, pt reports that his symptoms are worsening w/ increasing pain in his lower back and L foot.  He will also intermittently feel L thigh and L lateral calf pain.  He con't to have paresthesias and weakness in his L ankle dorsiflexors.  Patient had a visit scheduled with physical therapy for November 14 but he canceled it yesterday fearing that he will be able to do physical therapy because he is weak.  Dx imaging: 12/20/20 L-spine MRI  02/26/20 L-spine XR  02/15/07 L-spine XR  Pertinent review of systems: No fevers or chills  Relevant historical information: History of stroke due to A. fib.  Hospitalization this year due to small bowel obstruction with postsurgical complication.   Exam:  BP 126/74 (BP Location: Left Arm, Patient Position: Sitting, Cuff Size: Normal)   Pulse (!) 52   Ht 6' (1.829 m)   Wt 207 lb 3.2 oz (94 kg)   SpO2 96%   BMI 28.10 kg/m  General: Well Developed, well nourished, and in no acute distress.   MSK: L-spine nontender midline decreased lumbar motion.    Lab and Radiology Results No results found for this or any previous visit (from the past 72 hour(s)). MR Lumbar Spine Wo Contrast  Result Date: 12/22/2020 CLINICAL DATA:  Low back pain for over 6 weeks EXAM: MRI LUMBAR SPINE WITHOUT CONTRAST TECHNIQUE: Multiplanar, multisequence MR imaging of the lumbar spine was performed. No intravenous contrast was administered. COMPARISON:  None. FINDINGS: Segmentation:  5 lumbar type vertebrae Alignment:  L4-5 grade 1 anterolisthesis.  Mild scoliosis Vertebrae:  No  fracture, evidence of discitis, or bone lesion. Conus medullaris and cauda equina: Conus extends to the T12-L1 level. Conus appears somewhat gracile but this may be related to slice selection and minimal coverage. Negative cauda equina. Paraspinal and other soft tissues: Bladder wall thickening with trabeculation, cellules, diverticula. Disc levels: T12- L1: Ventral spondylosis L1-L2: Disc narrowing and bulging with endplate spurring. L2-L3: Disc narrowing and bulging with left foraminal protrusion. Asymmetric left facet spurring. Left foraminal impingement. Left subarticular recess narrowing although no definite L3 compression L3-L4: Disc narrowing and bulging. Degenerative facet spurring and ligamentum flavum thickening. Advanced spinal stenosis. Moderate bilateral foraminal impingement L4-L5: Disc narrowing and bulging. There is asymmetric right-sided height loss and ridging. Facet osteoarthritis with severe degeneration asymmetric to the right, with anterolisthesis. Triangular narrowing of the thecal sac crowding the bilateral L5 nerve roots. Advanced right foraminal impingement L5-S1:Facet osteoarthritis with spurring and left anterior synovial cyst measuring 4 mm. This cyst is noncompressive. Mild disc space narrowing. IMPRESSION: 1. Generalized spinal degeneration with scoliosis and L4-5 anterolisthesis. 2. Foraminal impingement on the left at L2-3, L3-4 and right L3-4, L4-5. 3. Spinal stenosis that is advanced at L3-4 and mild to moderate at L4-5. 4. Incidental findings of chronic bladder outlet obstruction. Electronically Signed   By: Jorje Guild M.D.   On: 12/22/2020 08:07    I, Lynne Leader, personally (independently) visualized and performed the interpretation of the images attached in this note.    Assessment and Plan: 80 y.o. male  with low back pain with left leg weakness and lumbar radiculopathy.  Radiating pain is in a left L5 dermatomal pattern which does correspond to the changes seen on  his MRI today. Plan for epidural steroid injection targeting the L5 nerve. However we will need to stop the Eliquis temporarily to do this and the last time he had stoppage of his anticoagulation for a elective procedure he had a stroke so there is obviously some risk.  However he is in enough pain that both the patient and I feel that the procedure is worth the risk.  Additionally he has back pain and I believe back pain due to the degenerative changes seen on MRI as well as core weakness.  Physical therapy will be essential to improving his overall functional status and core strength and likely improving his pain.  Stressed the importance of going to physical therapy even if he is experiencing weakness.  The key goals of physical therapy is to improve weakness and physical therapy is certainly able to manage the patient and his situation.  Reorder PT and encouraged him to reschedule it.  If unable to improve with PT certainly could proceed with trial of facet injections as he has multiple targets but again every injection would require temporarily stoppage of his Eliquis which is dangerous.  Recheck in a month. Temporary refill hydrocodone.    PDMP reviewed during this encounter. Orders Placed This Encounter  Procedures   DG INJECT DIAG/THERA/INC NEEDLE/CATH/PLC EPI/LUMB/SAC W/IMG    Lumbar epidural for Left L5 radiculopathy.  Technique and level per radiology.    Standing Status:   Future    Standing Expiration Date:   12/22/2021    Scheduling Instructions:     Lumbar epidural for Left L5 radiculopathy.  Technique and level per radiology.    Order Specific Question:   Reason for Exam (SYMPTOM  OR DIAGNOSIS REQUIRED)    Answer:   lumbar radiculpathy    Order Specific Question:   Preferred Imaging Location?    Answer:   [id number]   Ambulatory referral to Physical Therapy    Referral Priority:   Routine    Referral Type:   Physical Medicine    Referral Reason:   Specialty Services  Required    Requested Specialty:   Physical Therapy    Number of Visits Requested:   1   Meds ordered this encounter  Medications   HYDROcodone-acetaminophen (NORCO/VICODIN) 5-325 MG tablet    Sig: Take 1 tablet by mouth every 6 (six) hours as needed.    Dispense:  15 tablet    Refill:  0     Discussed warning signs or symptoms. Please see discharge instructions. Patient expresses understanding.   The above documentation has been reviewed and is accurate and complete Lynne Leader, M.D.   Total encounter time 20 minutes including face-to-face time with the patient and, reviewing past medical record, and charting on the date of service.   MRI review treatment plan and options.

## 2020-12-22 NOTE — Progress Notes (Signed)
Chronic Care Management Pharmacy Note  12/23/2020 Name:  William Norman MRN:  [id number] DOB:  1940/08/31  Summary: Initial visit with PharmD.  Discussed BP and all meds as well as pain he is currently experiencing in lower back, radiating down his leg.    Recommendations/Changes made from today's visit: Monitor BP - consider re initiation of HCTZ if BP remains elevated FU with sports med on pain  Plan: FU w me in 6 months or sooner if needed   Subjective: William Norman is an 80 y.o. year old male who is a primary patient of Vivi Barrack, MD.  The CCM team was consulted for assistance with disease management and care coordination needs.    Engaged with patient face to face for initial visit in response to provider referral for pharmacy case management and/or care coordination services.   Consent to Services:  The patient was given the following information about Chronic Care Management services today, agreed to services, and gave verbal consent: 1. CCM service includes personalized support from designated clinical staff supervised by the primary care provider, including individualized plan of care and coordination with other care providers 2. 24/7 contact phone numbers for assistance for urgent and routine care needs. 3. Service will only be billed when office clinical staff spend 20 minutes or more in a month to coordinate care. 4. Only one practitioner may furnish and bill the service in a calendar month. 5.The patient may stop CCM services at any time (effective at the end of the month) by phone call to the office staff. 6. The patient will be responsible for cost sharing (co-pay) of up to 20% of the service fee (after annual deductible is met). Patient agreed to services and consent obtained.  Patient Care Team: Vivi Barrack, MD as PCP - General (Family Medicine) Jacolyn Reedy, MD as Consulting Physician (Cardiology) Rutherford Guys, MD as Consulting  Physician (Ophthalmology) Bettina Gavia Hilton Cork, MD as Consulting Physician (Cardiology) Edrick Kins, DPM as Consulting Physician (Podiatry) Lyndee Hensen, PT as Physical Therapist (Physical Therapy) Edythe Clarity, [facility] as Pharmacist (Pharmacist)  Recent office visits: 11/20/20 [name]) - working on lifestyle mods for glucose.  Most recent A1c was 5.9  Recent consult visits: 11/25/20 [facility], Cardiology) - Volume overload improved, stable on current medication regimen.  Identified with NYHA Class I HF.  11/10/20 [facility], Cardiology) - reports SOB and fluid overload.  Continue to follow, BP is currently controlled.  Hospital visits: 10/16/20 (ED, SOB) - concern for low O2, EKG unremarkable in ED, he was released to home based on these assessments.   Objective:  Lab Results  Component Value Date   CREATININE 0.80 11/17/2020   BUN 16 11/17/2020   GFR 78.53 07/20/2020   GFRNONAA >60 10/16/2020   GFRAA 97 12/03/2019   NA 141 11/17/2020   K 4.6 11/17/2020   CALCIUM 9.7 11/17/2020   CO2 21 11/17/2020   GLUCOSE 102 (H) 11/17/2020    Lab Results  Component Value Date/Time   HGBA1C 5.9 07/20/2020 02:50 PM   HGBA1C 5.5 02/17/2020 04:01 AM   GFR 78.53 07/20/2020 02:50 PM   GFR 95.88 07/08/2019 02:27 PM    Last diabetic Eye exam: No results found for: HMDIABEYEEXA  Last diabetic Foot exam: No results found for: HMDIABFOOTEX   Lab Results  Component Value Date   CHOL 124 07/20/2020   HDL 49.60 07/20/2020   LDLCALC 60 07/20/2020   LDLDIRECT 131.5 04/22/2010   TRIG 54 09/14/2020  CHOLHDL 2 07/20/2020    Hepatic Function Latest Ref Rng & Units 10/02/2020 09/17/2020 09/14/2020  Total Protein 6.1 - 8.1 g/dL 6.5 6.1(L) 5.5(L)  Albumin 3.5 - 5.0 g/dL - 2.0(L) 1.8(L)  AST 10 - 35 U/L 91(H) 74(H) 107(H)  ALT 9 - 46 U/L 79(H) 108(H) 145(H)  Alk Phosphatase 38 - 126 U/L - 123 137(H)  Total Bilirubin 0.2 - 1.2 mg/dL 0.7 1.4(H) 1.1  Bilirubin, Direct 0.0 - 0.3 mg/dL - - -    Lab  Results  Component Value Date/Time   TSH 3.18 07/20/2020 02:50 PM   TSH 2.86 09/30/2019 01:49 PM    CBC Latest Ref Rng & Units 10/16/2020 10/02/2020 09/15/2020  WBC 4.0 - 10.5 K/uL 8.0 7.1 10.1  Hemoglobin 13.0 - 17.0 g/dL 11.6(L) 11.6(L) 9.1(L)  Hematocrit 39.0 - 52.0 % 37.5(L) 37.0(L) 29.2(L)  Platelets 150 - 400 K/uL 380 397 394    No results found for: VD25OH  Clinical ASCVD: Yes  The ASCVD Risk score (Arnett DK, et al., 2019) failed to calculate for the following reasons:   The 2019 ASCVD risk score is only valid for ages 89 to 62   The patient has a prior MI or stroke diagnosis    Depression screen [facility] 2/9 10/02/2020 03/23/2020 04/12/2019  Decreased Interest 2 0 0  Down, Depressed, Hopeless 1 0 0  PHQ - 2 Score 3 0 0  Altered sleeping 1 - -  Tired, decreased energy 3 - -  Change in appetite 2 - -  Feeling bad or failure about yourself  1 - -  Trouble concentrating 1 - -  Moving slowly or fidgety/restless 1 - -  Suicidal thoughts 1 - -  PHQ-9 Score 13 - -  Difficult doing work/chores Somewhat difficult - -  Some recent data might be hidden      Social History   Tobacco Use  Smoking Status Former   Packs/day: 1.00   Years: 60.00   Pack years: 60.00   Types: Cigarettes   Quit date: 03/25/1998   Years since quitting: 22.7  Smokeless Tobacco Never   BP Readings from Last 3 Encounters:  12/22/20 126/74  12/09/20 (!) 152/92  11/25/20 (!) 154/88   Pulse Readings from Last 3 Encounters:  12/22/20 (!) 52  12/09/20 87  11/25/20 76   Wt Readings from Last 3 Encounters:  12/22/20 207 lb 3.2 oz (94 kg)  12/09/20 203 lb 6.4 oz (92.3 kg)  11/25/20 200 lb (90.7 kg)   BMI Readings from Last 3 Encounters:  12/22/20 28.10 kg/m  12/09/20 27.59 kg/m  11/25/20 27.12 kg/m    Assessment/Interventions: Review of patient past medical history, allergies, medications, health status, including review of consultants reports, laboratory and other test data, was performed as part  of comprehensive evaluation and provision of chronic care management services.   SDOH:  (Social Determinants of Health) assessments and interventions performed: Yes  Financial Resource Strain: Low Risk    Difficulty of Paying Living Expenses: Not very hard    SDOH Screenings   Alcohol Screen: Not on file  Depression (PHQ2-9): Medium Risk   PHQ-2 Score: 13  Financial Resource Strain: Low Risk    Difficulty of Paying Living Expenses: Not very hard  Food Insecurity: Not on file  Housing: Not on file  Physical Activity: Not on file  Social Connections: Not on file  Stress: Not on file  Tobacco Use: Medium Risk   Smoking Tobacco Use: Former   Smokeless Tobacco Use: Never  Passive Exposure: Not on file  Transportation Needs: Not on file    [place]  Allergies  Allergen Reactions   Other Other (See Comments)    Some form of numbing medication used by dentist, almost died   Simvastatin Other (See Comments)    Muscle aches     Medications Reviewed Today     Reviewed by Wendy Poet, LAT (Technician) on 12/22/20 at 1344  Med List Status: <None>   Medication Order Taking? Sig Documenting Provider Last Dose Status Informant  acetaminophen (ACETAMINOPHEN 8 HOUR) 650 MG CR tablet [id number] Yes Take 1 tablet (650 mg total) by mouth every 8 (eight) hours as needed for pain. Vivi Barrack, MD Taking Active   apixaban [facility]) 5 MG TABS tablet [id number] Yes Take 1 tablet (5 mg total) by mouth 2 (two) times daily. Danford, Suann Larry, MD Taking Active Multiple Informants  atenolol (TENORMIN) 25 MG tablet [id number] Yes Take 1 tablet (25 mg total) by mouth daily. Nita Sells, MD Taking Active   atorvastatin (LIPITOR) 40 MG tablet [id number] Yes TAKE 1/2 TABLET BY MOUTH DAILY Vivi Barrack, MD Taking Active Multiple Informants  Coenzyme Q10 (COQ10) 200 MG CAPS [id number] Yes Take 1 capsule by mouth daily. Vivi Barrack, MD Taking Active   Cyanocobalamin (VITAMIN B-12)  1000 MCG SUBL [id number] Yes Place 1 tablet (1,000 mcg total) under the tongue daily.  Patient taking differently: Take 1 tablet by mouth daily.   Vivi Barrack, MD Taking Active   furosemide (LASIX) 20 MG tablet [id number] Yes Take 1 tablet (20 mg total) by mouth daily. Richardo Priest, MD Taking Active   HYDROcodone-acetaminophen (NORCO/VICODIN) 5-325 MG tablet [id number] Yes Take 1 tablet by mouth every 6 (six) hours as needed. Gregor Hams, MD Taking Active   hydroxypropyl methylcellulose / hypromellose (ISOPTO TEARS / GONIOVISC) 2.5 % ophthalmic solution [id number] Yes Place 1 drop into both eyes 2 (two) times daily as needed for dry eyes. [provider] Taking Active Multiple Informants  nitroGLYCERIN (NITROSTAT) 0.4 MG SL tablet [id number] Yes Dissolve 1 tablet under the tongue every 5 minutes as needed  Patient taking differently: Dissolve 1 tablet under the tongue every 5 minutes as needed for chest pain   Vivi Barrack, MD Taking Active   oxymetazoline (AFRIN) 0.05 % nasal spray [id number] Yes Place 2 sprays into both nostrils at bedtime as needed for congestion. [provider] Taking Active Multiple Informants            Patient Active Problem List   Diagnosis Date Noted   Osteoarthritis 11/20/2020   Constipation 10/02/2020   Protein-calorie malnutrition, severe 09/03/2020   SBO (small bowel obstruction) ([place]) 08/28/2020   AAA (abdominal aortic aneurysm) 03/06/2020   Porokeratosis 02/25/2020   History of ischemic stroke 02/16/2020   Eczema    DVT (deep venous thrombosis) (HCC)    CAD (coronary artery disease)    Recurrent UTI 08/14/2019   Hyperglycemia 07/08/2019   Persistent atrial fibrillation ([place]) 04/10/2018   Chronic dermatitis of hands 10/12/2015   Soft tissue lesion of foot 08/27/2015   Stroke ([place]) 06/18/2015   BPH associated with nocturia 08/29/2013   CAD (coronary artery disease), native coronary artery    Long term current use of  anticoagulant therapy    Personal history of DVT (deep vein thrombosis)    Chronic venous insufficiency    Erectile dysfunction 07/11/2012   Lumbar disc disease    GERD 08/04/2006   Dyslipidemia  Essential hypertension     Immunization History  Administered Date(s) Administered   Fluad Quad(high Dose 65+) 10/15/2018   Influenza Split 02/14/2005, 11/20/2007, 11/14/2009, 11/16/2012, 10/10/2013, 10/21/2014, 10/12/2015, 11/23/2017   Influenza Whole 02/14/2005, 11/20/2007, 11/14/2009   Influenza, High Dose Seasonal PF 10/21/2014, 10/12/2015, 11/23/2017, 10/15/2018, 11/06/2019   Influenza,inj,Quad PF,6+ Mos 10/10/2013   Influenza-Unspecified 11/16/2020   PFIZER Comirnaty(Gray Top)Covid-19 Tri-Sucrose Vaccine 05/20/2020   PFIZER(Purple Top)SARS-COV-2 Vaccination 03/01/2019, 03/21/2019   Pneumococcal Conjugate-13 10/21/2014   Pneumococcal Polysaccharide-23 02/15/2003, 02/18/2008   Td 02/15/2004, 10/21/2014   Tdap 02/15/2004, 10/21/2014   Zoster Recombinat (Shingrix) 04/15/2019, 06/17/2019   Zoster, Live 03/09/2010    Conditions to be addressed/monitored:  HTN, CAD, Afib, GERD, HLD, BPH, Hyperglycemia  Care Plan : General Pharmacy (Adult)  Updates made by Edythe Clarity, RPH since 12/23/2020 12:00 AM     Problem: HTN, CAD, Afib, GERD, HLD, BPH, Hyperglycemia   Priority: High  Onset Date: 12/22/2020     Long-Range Goal: Patient-Specific Goal   Start Date: 12/22/2020  Expected End Date: 06/22/2021  This Visit's Progress: On track  Priority: High  Note:   Current Barriers:  Possible elevated BP  Pharmacist Clinical Goal(s):  Patient will achieve control of BP as evidenced by monitoring through collaboration with PharmD and provider.   Interventions: 1:1 collaboration with Vivi Barrack, MD regarding development and update of comprehensive plan of care as evidenced by provider attestation and co-signature Inter-disciplinary care team collaboration (see longitudinal plan of  care) Comprehensive medication review performed; medication list updated in electronic medical record  Hypertension (BP goal <130/80) -Controlled -Current treatment: Atenolol 25mg  -Medications previously tried: HCTZ (d/c)  -Current home readings: not checking at home, last few office BP elevated -Current exercise habits: minimal -Denies hypotensive/hypertensive symptoms -Educated on BP goals and benefits of medications for prevention of heart attack, stroke and kidney damage; Daily salt intake goal < 2300 mg; Importance of home blood pressure monitoring; -Counseled to monitor BP at home as able, document, and provide log at future appointments -Recommend to keep an eye on BP - he reports recently d/c HCTZ and decreased his atenolol dose.  He has been in pain which could be also contributing to increased BP. -Recommended to continue current medication Monitor BP - could consider addition of HCTZ back if BP continues to be elevated.  Hyperlipidemia: (LDL goal < 70) -Controlled -Current treatment: Atorvastatin 40mg  daily -Medications previously tried: none noted  -Most recent LDL is controlled at 34!  -Educated on Cholesterol goals;  Benefits of statin for ASCVD risk reduction; Importance of limiting foods high in cholesterol; Exercise goal of 150 minutes per week; -Recommended to continue current medication Continue routine screenings.  Atrial Fibrillation (Goal: prevent stroke and major bleeding) -Controlled -CHADSVASC: 6 -Current treatment: Rate control: Atenolol 25mg  daily Anticoagulation: Eliquis 5mg  BID -Medications previously tried: none noted -Home BP and HR readings: not checking at home, elevated in office  -Counseled on increased risk of stroke due to Afib and benefits of anticoagulation for stroke prevention; bleeding risk associated with Eliquis and importance of self-monitoring for signs/symptoms of bleeding; -Does not feel rapid HR - no chest pain, denies  abnormal bleeding or bruising -Recommended to continue current medication  Hyperglycemia (Goal: Control A1c) -Controlled -Current treatment  None noted -Medications previously tried: none noted -Counseled on meaning of most recent A1c of 5.9 being in the lower end of the pre-diabetes range.  Just something to monitor for now.  -Counseled on diet and exercise extensively Recommended he try  and watch carbs and excess sugars in his diet.  Explained sources of carbohydrates in the diet. Continue current management for now - routine A1c screenings.  BPH (Goal: Minimize symptoms) -Controlled -Current treatment  None noted -Medications previously tried: alfuzosin  -Continues to report nocturia, getting up 2-3 times per evening to use the restroom.  He reports this is manageable currently. -Recommended continue current management for now, contact providers if symptoms worsen.  Patient Goals/Self-Care Activities Patient will:  - focus on medication adherence by pill counts check blood pressure periodically, document, and provide at future appointments  Follow Up Plan: The care management team will reach out to the patient again over the next 180 days.        Medication Assistance: None required.  Patient affirms current coverage meets needs.  Compliance/Adherence/Medication fill history: Care Gaps: None at this time  Star-Rating Drugs: Atorvastatin 55m 10/01/20 90ds  Patient's [facility] pharmacy [facility]  [id number] [place]: [phone]: [phone] pill box? No - prefers bottles Pt endorses 100% compliance  We discussed: Benefits of medication synchronization, packaging and delivery as well as enhanced pharmacist oversight with [facility]. Patient decided to: Continue current medication management strategy  Care Plan and Follow Up Patient Decision:  Patient agrees to Care Plan  and Follow-up.  Plan: The care management team will reach out to the patient again over the next 180 days.  ChBeverly MilchPharmD Clinical Pharmacist  [phone]

## 2020-12-22 NOTE — Patient Instructions (Addendum)
Good to see you today.  I've ordered a lumbar epidural injection.  [facility] will call you to schedule but here is their number if you would like to call them directly: [phone].  Contact Pt and reschedule. [phone]  Stop the Eliquis 24 hours prior to the injection and restart 48 hr after the injection.   Follow-up in 1 month.   Let me know if this is not working well.

## 2020-12-23 NOTE — Patient Instructions (Addendum)
Visit Information   Goals Addressed             This Visit's Progress    Track and Manage My Blood Pressure-Hypertension       Timeframe:  Long-Range Goal Priority:  High Start Date:    12/22/20                         Expected End Date:  06/21/21                     Follow Up Date 03/24/21    - check blood pressure weekly - choose a place to take my blood pressure (home, clinic or office, retail store) - write blood pressure results in a log or diary    Why is this important?   You won't feel high blood pressure, but it can still hurt your blood vessels.  High blood pressure can cause heart or kidney problems. It can also cause a stroke.  Making lifestyle changes like losing a little weight or eating less salt will help.  Checking your blood pressure at home and at different times of the day can help to control blood pressure.  If the doctor prescribes medicine remember to take it the way the doctor ordered.  Call the office if you cannot afford the medicine or if there are questions about it.     Notes:        Patient Care Plan: General Pharmacy (Adult)     Problem Identified: HTN, CAD, Afib, GERD, HLD, BPH, Hyperglycemia   Priority: High  Onset Date: 12/22/2020     Long-Range Goal: Patient-Specific Goal   Start Date: 12/22/2020  Expected End Date: 06/22/2021  This Visit's Progress: On track  Priority: High  Note:   Current Barriers:  Possible elevated BP  Pharmacist Clinical Goal(s):  Patient will achieve control of BP as evidenced by monitoring through collaboration with PharmD and provider.   Interventions: 1:1 collaboration with Vivi Barrack, MD regarding development and update of comprehensive plan of care as evidenced by provider attestation and co-signature Inter-disciplinary care team collaboration (see longitudinal plan of care) Comprehensive medication review performed; medication list updated in electronic medical record  Hypertension (BP goal  <130/80) -Controlled -Current treatment: Atenolol 25mg  -Medications previously tried: HCTZ (d/c)  -Current home readings: not checking at home, last few office BP elevated -Current exercise habits: minimal -Denies hypotensive/hypertensive symptoms -Educated on BP goals and benefits of medications for prevention of heart attack, stroke and kidney damage; Daily salt intake goal < 2300 mg; Importance of home blood pressure monitoring; -Counseled to monitor BP at home as able, document, and provide log at future appointments -Recommend to keep an eye on BP - he reports recently d/c HCTZ and decreased his atenolol dose.  He has been in pain which could be also contributing to increased BP. -Recommended to continue current medication Monitor BP - could consider addition of HCTZ back if BP continues to be elevated.  Hyperlipidemia: (LDL goal < 70) -Controlled -Current treatment: Atorvastatin 40mg  daily -Medications previously tried: none noted  -Most recent LDL is controlled at 45!  -Educated on Cholesterol goals;  Benefits of statin for ASCVD risk reduction; Importance of limiting foods high in cholesterol; Exercise goal of 150 minutes per week; -Recommended to continue current medication Continue routine screenings.  Atrial Fibrillation (Goal: prevent stroke and major bleeding) -Controlled -CHADSVASC: 6 -Current treatment: Rate control: Atenolol 25mg  daily Anticoagulation: Eliquis 5mg  BID -  Medications previously tried: none noted -Home BP and HR readings: not checking at home, elevated in office  -Counseled on increased risk of stroke due to Afib and benefits of anticoagulation for stroke prevention; bleeding risk associated with Eliquis and importance of self-monitoring for signs/symptoms of bleeding; -Does not feel rapid HR - no chest pain, denies abnormal bleeding or bruising -Recommended to continue current medication  Hyperglycemia (Goal: Control A1c) -Controlled -Current  treatment  None noted -Medications previously tried: none noted -Counseled on meaning of most recent A1c of 5.9 being in the lower end of the pre-diabetes range.  Just something to monitor for now.  -Counseled on diet and exercise extensively Recommended he try and watch carbs and excess sugars in his diet.  Explained sources of carbohydrates in the diet. Continue current management for now - routine A1c screenings.  BPH (Goal: Minimize symptoms) -Controlled -Current treatment  None noted -Medications previously tried: alfuzosin  -Continues to report nocturia, getting up 2-3 times per evening to use the restroom.  He reports this is manageable currently. -Recommended continue current management for now, contact providers if symptoms worsen.  Patient Goals/Self-Care Activities Patient will:  - focus on medication adherence by pill counts check blood pressure periodically, document, and provide at future appointments  Follow Up Plan: The care management team will reach out to the patient again over the next 180 days.       William Norman was given information about Chronic Care Management services today including:  CCM service includes personalized support from designated clinical staff supervised by his physician, including individualized plan of care and coordination with other care providers 24/7 contact phone numbers for assistance for urgent and routine care needs. Standard insurance, coinsurance, copays and deductibles apply for chronic care management only during months in which we provide at least 20 minutes of these services. Most insurances cover these services at 100%, however patients may be responsible for any copay, coinsurance and/or deductible if applicable. This service may help you avoid the need for more expensive face-to-face services. Only one practitioner may furnish and bill the service in a calendar month. The patient may stop CCM services at any time (effective at  the end of the month) by phone call to the office staff.  Patient agreed to services and verbal consent obtained.   The patient verbalized understanding of instructions, educational materials, and care plan provided today and agreed to receive a mailed copy of patient instructions, educational materials, and care plan.  Telephone follow up appointment with pharmacy team member scheduled for: 6 months  Edythe Clarity, [place]

## 2020-12-28 ENCOUNTER — Telehealth: Payer: Self-pay | Admitting: Family Medicine

## 2020-12-28 ENCOUNTER — Other Ambulatory Visit: Payer: Self-pay

## 2020-12-28 ENCOUNTER — Ambulatory Visit: Payer: Medicare HMO | Admitting: Physical Therapy

## 2020-12-28 MED ORDER — HYDROCODONE-ACETAMINOPHEN 5-325 MG PO TABS: 1.0000 | ORAL_TABLET | Freq: Four times a day (QID) | ORAL | 0 refills | Status: DC | PRN

## 2020-12-28 NOTE — Telephone Encounter (Signed)
MEDICATION: atenolol (TENORMIN) 25 MG tablet  PH[facility]ACY[id number] [address], [place] Phone:  [phone]  Fax:  [phone]      Comments:   **Let patient know to contact pharmacy at the end of the day to make sure medication is ready. **  ** Please notify patient to allow 48-72 hours to process**  **Encourage patient to contact the pharmacy for refills or they can request refills through [facility]**

## 2020-12-28 NOTE — Telephone Encounter (Signed)
Patient came by my office today due to say that his epidural steroid injection has not been authorized yet or scheduled.  I contacted [facility] imaging and confirmed it has not been authorized.  Refilled hydrocodone.

## 2020-12-30 ENCOUNTER — Telehealth: Payer: Self-pay

## 2020-12-30 MED ORDER — ATENOLOL 25 MG PO TABS: 25.0000 mg | ORAL_TABLET | Freq: Every day | ORAL | 3 refills | Status: DC

## 2020-12-30 NOTE — Addendum Note (Signed)
Addended by: Betti Cruz on: 12/30/2020 11:33 AM   Modules accepted: Orders

## 2020-12-30 NOTE — Telephone Encounter (Signed)
Patient came into the office today and said he is supposed to have a Lumbar injection, and Dr.Coreys and [facility] imaging office is needing approval for [name] to come off of Eliquis, patient asked if we can either give them verbal approval or written so he is able to get the procedure scheduled.

## 2020-12-30 NOTE — Telephone Encounter (Signed)
ERROR

## 2020-12-30 NOTE — Telephone Encounter (Signed)
Patient came into the office and advised me that he is needing a refill on this prescription. Got a call from the pharmacy that it was ready for pick up, but he states he is supposed to have a 90 day supply instead of 30. Asking if the prescription can be sent in as 65.

## 2020-12-30 NOTE — Telephone Encounter (Signed)
Rx done by Dr Rogelia Boga

## 2020-12-31 ENCOUNTER — Other Ambulatory Visit: Payer: Self-pay | Admitting: *Deleted

## 2020-12-31 MED ORDER — ATENOLOL 25 MG PO TABS: 25.0000 mg | ORAL_TABLET | Freq: Every day | ORAL | 1 refills | Status: DC

## 2020-12-31 NOTE — Telephone Encounter (Signed)
See note

## 2020-12-31 NOTE — Telephone Encounter (Signed)
Rx send to pharmacy with #[phone]

## 2021-01-01 ENCOUNTER — Telehealth: Payer: Self-pay

## 2021-01-01 NOTE — Telephone Encounter (Signed)
This should come from his cardiologist since they are the ones prescribing it. I think it should be fine but he needs to clear it with them first.  William Norman. [name], MD 01/01/2021 7:48 AM

## 2021-01-01 NOTE — Telephone Encounter (Signed)
Pt called about lab results. Please Advise.

## 2021-01-01 NOTE — Telephone Encounter (Signed)
Lvm to call back

## 2021-01-01 NOTE — Telephone Encounter (Signed)
In interest of his best medical care, he will need to get clearance from cardiology to stop his blood thinner since they are the ones who prescribe and manage it. This is the only answer I can give him. I will not clear him to stop the medication until he talks with cardiology.   William Norman. [name], MD 01/01/2021 12:24 PM

## 2021-01-01 NOTE — Telephone Encounter (Signed)
Pt refused to take the answer you suggest, he wants to to do it

## 2021-01-04 ENCOUNTER — Telehealth: Payer: Self-pay

## 2021-01-04 NOTE — Telephone Encounter (Signed)
   [facility] Pre-operative Risk Assessment    Patient Name: William Norman  DOB: 16-Oct-1940 MRN: [id number]  HEARTCARE STAFF:  - IMPORTANT!!!!!! Under Visit Info/Reason for Call, type in Other and utilize the format Clearance MM/DD/YY or Clearance TBD. Do not use dashes or single digits. - Please review there is not already an duplicate clearance open for this procedure. - If request is for dental extraction, please clarify the # of teeth to be extracted. - If the patient is currently at the dentist's office, call Pre-Op Callback Staff (MA/nurse) to input urgent request.  - If the patient is not currently in the dentist office, please route to the Pre-Op pool.  Request for surgical clearance:  What type of surgery is being performed? Lumbar Epidural  When is this surgery scheduled? TBD  What type of clearance is required (medical clearance vs. Pharmacy clearance to hold med vs. Both)? Pharmacy  Are there any medications that need to be held prior to surgery and how long?Eliquis 4 doses if creatine greater than 50 and 6 doses if creatine less than 50 prior to procedure  Practice name and name of physician performing surgery? [facility]-   What is the office phone number? [phone]   7.   What is the office fax number? [phone]  8.   Anesthesia type (None, local, MAC, general) ?    Lowella Grip 01/04/2021, 3:31 PM  _________________________________________________________________   (provider comments below)

## 2021-01-04 NOTE — Telephone Encounter (Signed)
Spoke with patient stated he already spoke with cardiology

## 2021-01-04 NOTE — Telephone Encounter (Signed)
Spoke with patient stated he did not need lab results at this time

## 2021-01-05 NOTE — Telephone Encounter (Signed)
    Patient Name: [name]  DOB: Aug 07, 1940 MRN: [id number]  Primary Cardiologist: Dr. Bettina Gavia  Chart reviewed as part of pre-operative protocol coverage. Patient has upcoming spinal injection planned and we were asked to give our recommendations for holding Eliquis. Per Pharmacy, Typically hold Eliquis for 3 days prior to [facility]. Request is for a 2 day hold given CrCl >50.  Given patient's elevated CV risk including periprocedural stroke in Jan 2022 when patient held Warfarin for a procedure, discussed with Dr. Bettina Gavia who agreed with Pharmacy's recommendation and did not recommend bridging with Lovenox. Therefore, OK to hold Eliquis as requested. Please restart this as soon as able following spinal injection.  I will route this recommendation to the requesting party via Epic fax function and remove from pre-op pool.  Please call with questions.  Darreld Mclean, PA-C 01/05/2021, 3:59 PM

## 2021-01-05 NOTE — Telephone Encounter (Signed)
Patient with diagnosis of afib on Eliquis for anticoagulation.    Procedure: lumbar ESI Date of procedure: TBD  CHA2DS2-VASc Score = 7  This indicates a 11.2% annual risk of stroke. The patient's score is based upon: CHF History: 1 HTN History: 1 Diabetes History: 0 Stroke History: 2 Vascular Disease History: 1 Age Score: 2 Gender Score: 0   Hx of DVTs in 1998 and 2002.  Presented 02/2020 with stroke felt to be secondary to afib when warfarin was held for cystoscopy. He was changed to Eliquis at that time.  CrCl 62mL/min using adjusted body weight Platelet count 314K  Typically hold Eliquis for 3 days prior to [facility], request is for 2 days. Given pt's elevated CV risk including periprocedural stroke in Jan 2022 when pt held warfarin for a procedure, will forward to MD for input on anticoag length of hold and if Lovenox bridging is recommended.

## 2021-01-05 NOTE — Telephone Encounter (Signed)
Ok to hold Eliquis as requested without Lovenox bridge per Dr Bettina Gavia.

## 2021-01-11 ENCOUNTER — Ambulatory Visit
Admission: RE | Admit: 2021-01-11 | Discharge: 2021-01-11 | Disposition: A | Discharge status: Home / Self Care | Payer: Medicare HMO | Source: Ambulatory Visit | Attending: Family Medicine | Admitting: Family Medicine

## 2021-01-11 DIAGNOSIS — M545 Low back pain, unspecified: Secondary | ICD-10-CM

## 2021-01-11 DIAGNOSIS — M47816 Spondylosis without myelopathy or radiculopathy, lumbar region: Secondary | ICD-10-CM | POA: Diagnosis not present

## 2021-01-11 IMAGING — XA Imaging study
1 series · 1 of 1 positions shown · non-contrast
Comparison: none

CLINICAL DATA: Low back and bilateral lower extremity pain, left
worse than right. Multilevel lumbar spondylitic change.

[Series 1: ortho standard · 1 of 1 slices shown]
[im 1/1]
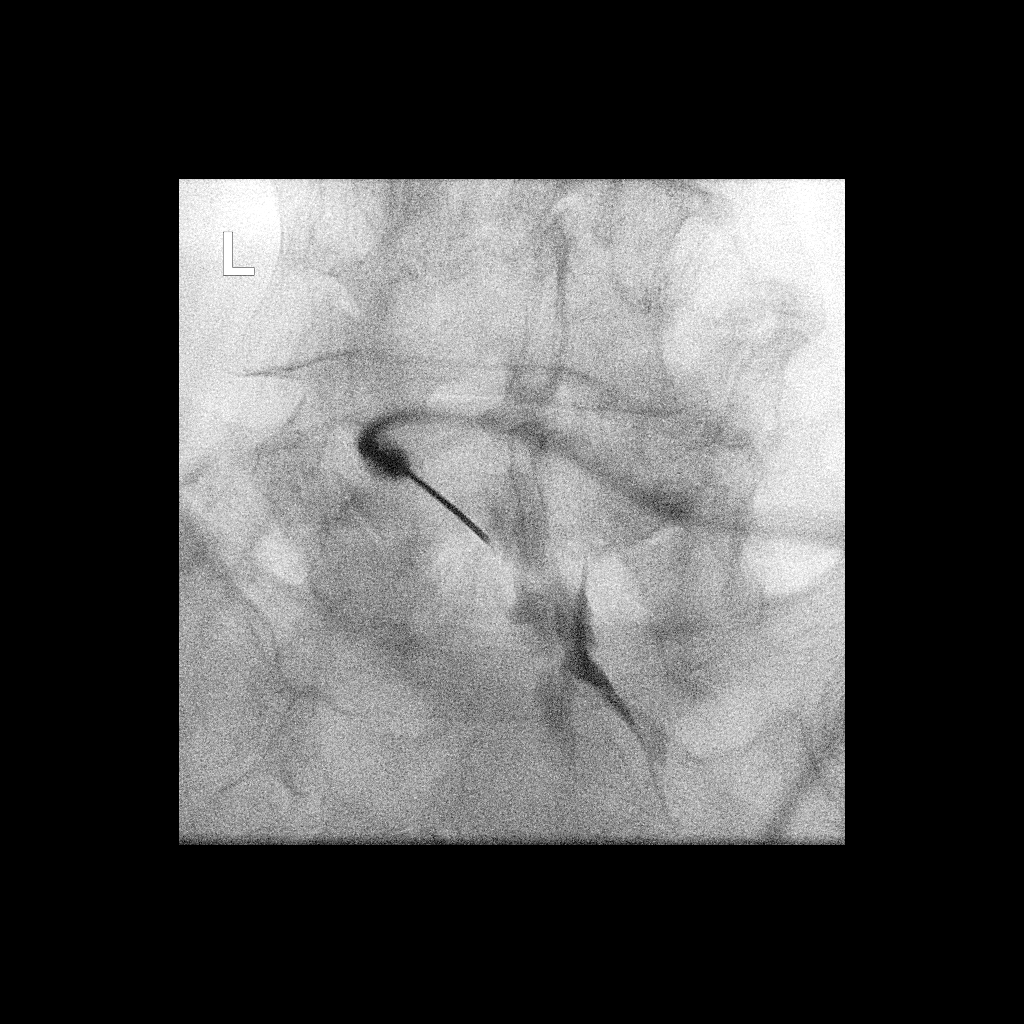

[1 of 1 positions shown; findings below may reference images not displayed]

EXAM:
LUMBAR EPIDURAL INJECTION:

DIAGNOSTIC EPIDURAL INJECTION:

THERAPEUTIC EPIDURAL INJECTION:

PROCEDURE:
The procedure, risks, benefits, and alternatives were explained to
the patient. Questions regarding the procedure were encouraged and
answered. The patient understands and consents to the procedure.

An interlaminar approach was performed on the left at L5-S1. The
overlying skin was cleansed and anesthetized. A 20 gauge epidural
needle was advanced using loss-of-resistance technique.

Injection of Isovue-M 200 shows a good epidural pattern with spread
above and below the level of needle placement, primarily on the
left, and crossing the midline. No vascular opacification is seen.

80mg of Depo-Medrol mixed with 2ml lidocaine 1% were instilled. The
procedure was well-tolerated, and the patient was discharged thirty
minutes following the injection in good condition.

FLUOROSCOPY TIME:  17 seconds; 14 [85] DAP

COMPLICATIONS:
None immediate
IMPRESSION: Technically successful epidural injection on the left at L5-S1.

## 2021-01-11 MED ORDER — METHYLPREDNISOLONE ACETATE 40 MG/ML INJ SUSP (RADIOLOG
80.0000 mg | Freq: Once | INTRAMUSCULAR | Status: AC
  Administered 2021-01-11: 80 mg via EPIDURAL

## 2021-01-11 MED ORDER — IOPAMIDOL (ISOVUE-M 200) INJECTION 41%
1.0000 mL | Freq: Once | INTRAMUSCULAR | Status: AC
  Administered 2021-01-11: 1 mL via EPIDURAL

## 2021-01-11 NOTE — Discharge Instructions (Signed)

## 2021-01-13 DIAGNOSIS — I25118 Atherosclerotic heart disease of native coronary artery with other forms of angina pectoris: Secondary | ICD-10-CM

## 2021-01-13 DIAGNOSIS — E785 Hyperlipidemia, unspecified: Secondary | ICD-10-CM

## 2021-01-13 DIAGNOSIS — I10 Essential (primary) hypertension: Secondary | ICD-10-CM

## 2021-01-13 DIAGNOSIS — I4819 Other persistent atrial fibrillation: Secondary | ICD-10-CM

## 2021-01-19 ENCOUNTER — Encounter: Payer: Self-pay | Admitting: Family Medicine

## 2021-01-19 ENCOUNTER — Other Ambulatory Visit: Payer: Self-pay

## 2021-01-19 ENCOUNTER — Ambulatory Visit: Payer: Medicare HMO | Admitting: Family Medicine

## 2021-01-19 VITALS — BP 140/80 | Ht 72.0 in | Wt 203.8 lb

## 2021-01-19 DIAGNOSIS — M21372 Foot drop, left foot: Secondary | ICD-10-CM

## 2021-01-19 DIAGNOSIS — M5416 Radiculopathy, lumbar region: Secondary | ICD-10-CM | POA: Diagnosis not present

## 2021-01-19 NOTE — Patient Instructions (Addendum)
Good to see you today.  I've ordered another epidural injection.  You may call [facility] to schedule at [phone].  I've referred you to neurosurgery.  Their office will call you to schedule.  Follow-up: as needed

## 2021-01-19 NOTE — Progress Notes (Signed)
I, Wendy Poet, LAT, ATC, am serving as scribe for Dr. Lynne Leader.  William Norman is a 80 y.o. male who presents to [place] at [facility] today for low back pain with left leg weakness and lumbar radiculopathy. Radiating pain is in a left L5 dermatomal pattern. Pt was last seen by Dr. Georgina Snell on 12/22/20 and was advised to proceed to [facility], which was performed on 01/11/21. Additionally, PT was re-ordered, however he refused the service, again, completing 0 visits. Today, pt reports that his low back pain has resolved but his L hip and leg pain has worsened.  He states that he is unable to actively DF his L ankle.  He has not had any falls but is noticing a slap foot gait.  Patient would like a neurosurgical consultation.  Dx imaging: 12/20/20 L-spine MRI             02/26/20 L-spine XR             02/15/07 L-spine XR  Pertinent review of systems: No fevers or chills  Relevant historical information: History of stroke   Exam:  BP 140/80 (BP Location: Right Arm, Patient Position: Sitting, Cuff Size: Normal)   Ht 6' (1.829 m)   Wt 203 lb 12.8 oz (92.4 kg)   BMI 27.64 kg/m  General: Well Developed, well nourished, and in no acute distress.   MSK: Left leg foot and great toe dorsiflexion strength significantly diminished 2/5.  Sensation is intact.  Otherwise lower extremity strength is intact. Reflexes are intact.  L-spine: Decreased lumbar motion.    Lab and Radiology Results  EXAM: MRI LUMBAR SPINE WITHOUT CONTRAST   TECHNIQUE: Multiplanar, multisequence MR imaging of the lumbar spine was performed. No intravenous contrast was administered.   COMPARISON:  None.   FINDINGS: Segmentation:  5 lumbar type vertebrae   Alignment:  L4-5 grade 1 anterolisthesis.  Mild scoliosis   Vertebrae:  No fracture, evidence of discitis, or bone lesion.   Conus medullaris and cauda equina: Conus extends to the T12-L1 level. Conus appears somewhat gracile but this  may be related to slice selection and minimal coverage. Negative cauda equina.   Paraspinal and other soft tissues: Bladder wall thickening with trabeculation, cellules, diverticula.   Disc levels:   T12- L1: Ventral spondylosis   L1-L2: Disc narrowing and bulging with endplate spurring.   L2-L3: Disc narrowing and bulging with left foraminal protrusion. Asymmetric left facet spurring. Left foraminal impingement. Left subarticular recess narrowing although no definite L3 compression   L3-L4: Disc narrowing and bulging. Degenerative facet spurring and ligamentum flavum thickening. Advanced spinal stenosis. Moderate bilateral foraminal impingement   L4-L5: Disc narrowing and bulging. There is asymmetric right-sided height loss and ridging. Facet osteoarthritis with severe degeneration asymmetric to the right, with anterolisthesis. Triangular narrowing of the thecal sac crowding the bilateral L5 nerve roots. Advanced right foraminal impingement   L5-S1:Facet osteoarthritis with spurring and left anterior synovial cyst measuring 4 mm. This cyst is noncompressive. Mild disc space narrowing.   IMPRESSION: 1. Generalized spinal degeneration with scoliosis and L4-5 anterolisthesis. 2. Foraminal impingement on the left at L2-3, L3-4 and right L3-4, L4-5. 3. Spinal stenosis that is advanced at L3-4 and mild to moderate at L4-5. 4. Incidental findings of chronic bladder outlet obstruction.     Electronically Signed   By: Jorje Guild M.D.   On: 12/22/2020 08:07  I, Lynne Leader, personally (independently) visualized and performed the interpretation of the images attached in  this note.   EXAM: LUMBAR EPIDURAL INJECTION:   DIAGNOSTIC EPIDURAL INJECTION:   THERAPEUTIC EPIDURAL INJECTION:   PROCEDURE: The procedure, risks, benefits, and alternatives were explained to the patient. Questions regarding the procedure were encouraged and answered. The patient understands and  consents to the procedure.   An interlaminar approach was performed on the left at L5-S1. The overlying skin was cleansed and anesthetized. A 20 gauge epidural needle was advanced using loss-of-resistance technique.   Injection of Isovue-M 200 shows a good epidural pattern with spread above and below the level of needle placement, primarily on the left, and crossing the midline. No vascular opacification is seen.   80mg  of Depo-Medrol mixed with 46ml lidocaine 1% were instilled. The procedure was well-tolerated, and the patient was discharged thirty minutes following the injection in good condition.   FLUOROSCOPY TIME:  17 seconds; 14 uGym2 DAP   COMPLICATIONS: None immediate   IMPRESSION: Technically successful epidural injection on the left at L5-S1.     Electronically Signed   By: Lucrezia Europe M.D.   On: 01/11/2021 14:29   Assessment and Plan: 80 y.o. male with left lumbar radiculopathy with new left foot drop.  Patient has severe spinal stenosis and neuroforaminal stenosis in the lumbar spine that certainly could contribute to the new left-sided foot drop. Plan for repeat epidural steroid injection however we will ask radiology to perform it at L4-L5 which may be more beneficial.  Additionally reasonable to have a consultation with neurosurgery at this point.  He has a poor surgical candidate so exhausting conservative management would be helpful.  Discussed and offered ankle-foot orthosis.  Patient declined.  Patient should keep me updated.   PDMP not reviewed this encounter. Orders Placed This Encounter  Procedures   DG INJECT DIAG/THERA/INC NEEDLE/CATH/PLC EPI/LUMB/SAC W/IMG    Lumbar epidural for lumbar radiulopathy w/ new-onset L foot drop.  Technique and level per radiologist but please consider different level than prior ESO on 01/11/21.    Standing Status:   Future    Standing Expiration Date:   01/19/2022    Scheduling Instructions:     Lumbar epidural for lumbar  radiulopathy w/ new-onset L foot drop.  Technique and level per radiologist but please consider different level than prior ESO on 01/11/21.    Order Specific Question:   Reason for Exam (SYMPTOM  OR DIAGNOSIS REQUIRED)    Answer:   lumbar radiculopathy    Order Specific Question:   Preferred Imaging Location?    Answer:   [id number]   Ambulatory referral to Neurosurgery    Referral Priority:   Routine    Referral Type:   Surgical    Referral Reason:   Specialty Services Required    Referred to Provider:   Newman Pies, MD    Requested Specialty:   Neurosurgery    Number of Visits Requested:   1   No orders of the defined types were placed in this encounter.    Discussed warning signs or symptoms. Please see discharge instructions. Patient expresses understanding.   The above documentation has been reviewed and is accurate and complete Lynne Leader, M.D.

## 2021-01-21 ENCOUNTER — Ambulatory Visit: Payer: Medicare HMO | Admitting: Family Medicine

## 2021-01-24 ENCOUNTER — Encounter: Payer: Self-pay | Admitting: Family Medicine

## 2021-01-25 ENCOUNTER — Ambulatory Visit
Admission: RE | Admit: 2021-01-25 | Discharge: 2021-01-25 | Disposition: A | Discharge status: Home / Self Care | Payer: Medicare HMO | Source: Ambulatory Visit | Attending: Family Medicine | Admitting: Family Medicine

## 2021-01-25 ENCOUNTER — Other Ambulatory Visit: Payer: Self-pay | Admitting: Family Medicine

## 2021-01-25 ENCOUNTER — Other Ambulatory Visit: Payer: Self-pay

## 2021-01-25 DIAGNOSIS — M47817 Spondylosis without myelopathy or radiculopathy, lumbosacral region: Secondary | ICD-10-CM | POA: Diagnosis not present

## 2021-01-25 DIAGNOSIS — M5416 Radiculopathy, lumbar region: Secondary | ICD-10-CM

## 2021-01-25 DIAGNOSIS — M21372 Foot drop, left foot: Secondary | ICD-10-CM

## 2021-01-25 IMAGING — XA DG EPIDURAL NERVE ROOT
3 series · 3 of 3 positions shown · IV contrast (omnipaque)
Comparison: none

CLINICAL DATA: Lumbosacral spondylosis without myelopathy.
Resolution of back pain after previous injection. Persistent left
lower extremity pain. We elected to try a transforaminal approach.

EXAM:
SELECTIVE NERVE ROOT BLOCK AND TRANSFORAMINAL EPIDURAL STEROID
INJECTION UNDER FLUOROSCOPY
FLUOROSCOPY TIME:  25 seconds; 34 [3M] DAP
TECHNIQUE: An appropriate skin entry site was determined under fluoroscopy.
Operator donned sterile gloves and mask. Site was marked, prepped
with Betadine, draped in usual sterile fashion, infiltrated locally
with 1% lidocaine. A 22 gauge spinal needle was advanced to the
superior ventral margin of the left L4-5 neural foramen. Diagnostic
injection of 2 ml Omnipaque 180 showed partial outlining of the
exiting nerve root as well as epidural extension of contrast, with
no intravascular or subarachnoid component. 80 mg Depo-Medrol in 3
ml lidocaine 1% was administered. The patient tolerated procedure
well, with no immediate complication.

[Series 1: ortho standard · 1 of 1 slices shown (1 of 3)]
[im 1/1]
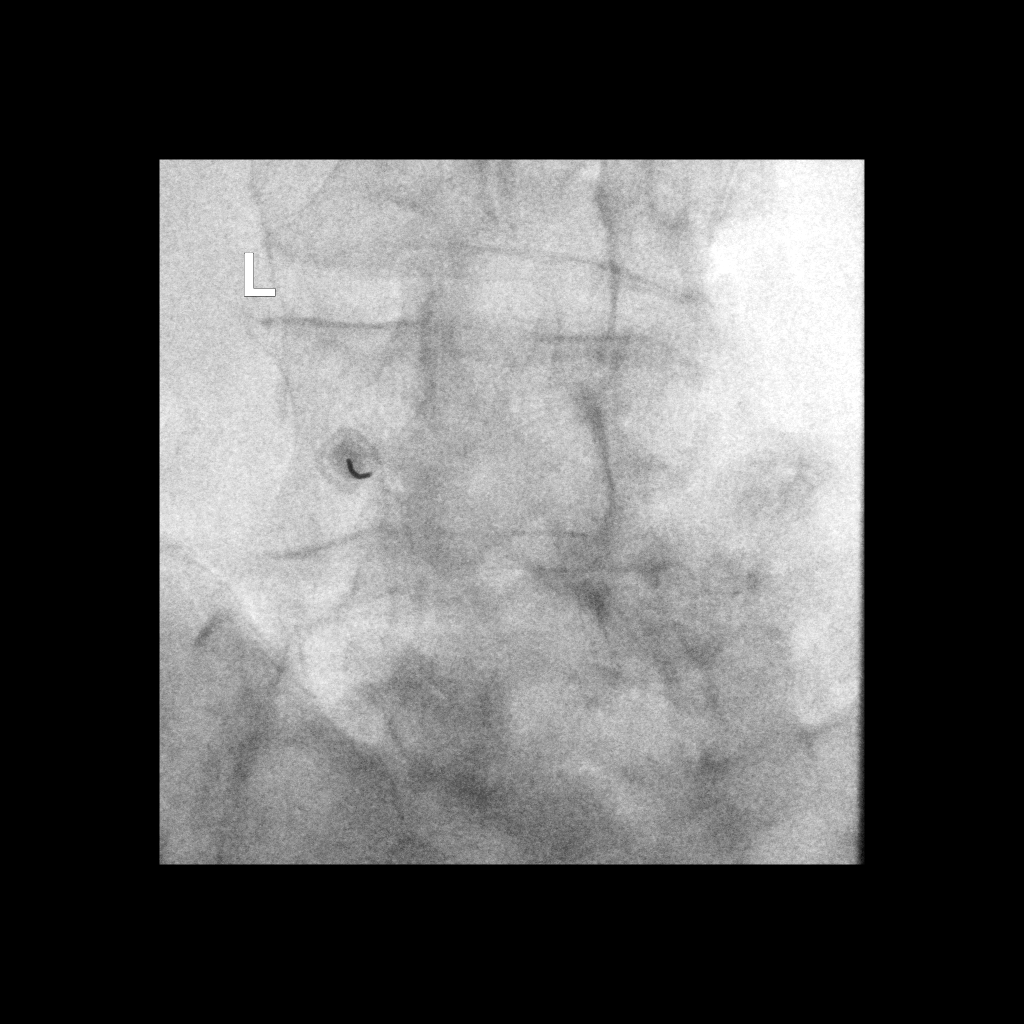

[Series 2: ortho standard · 1 of 1 slices shown (2 of 3)]
[im 1/1]
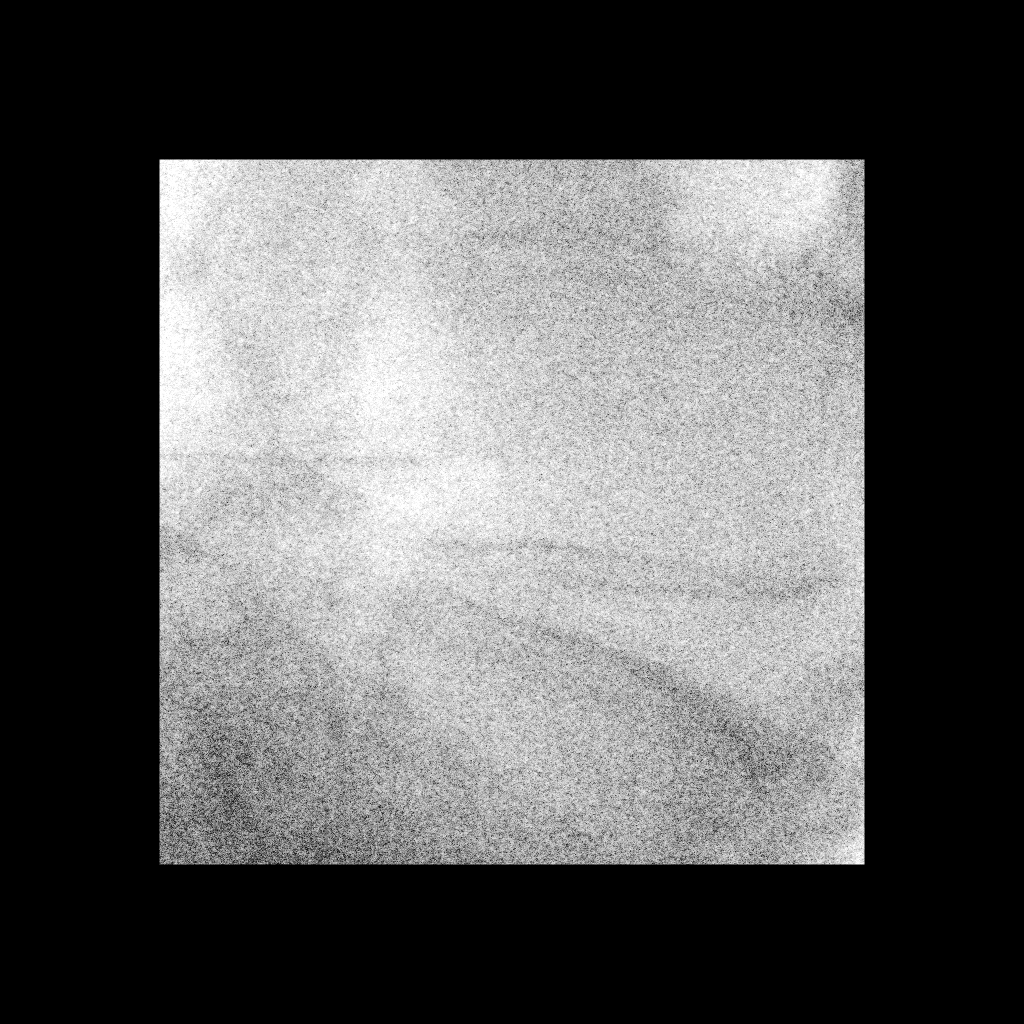

[Series 3: ortho standard · 1 of 1 slices shown (3 of 3)]
[im 1/1]
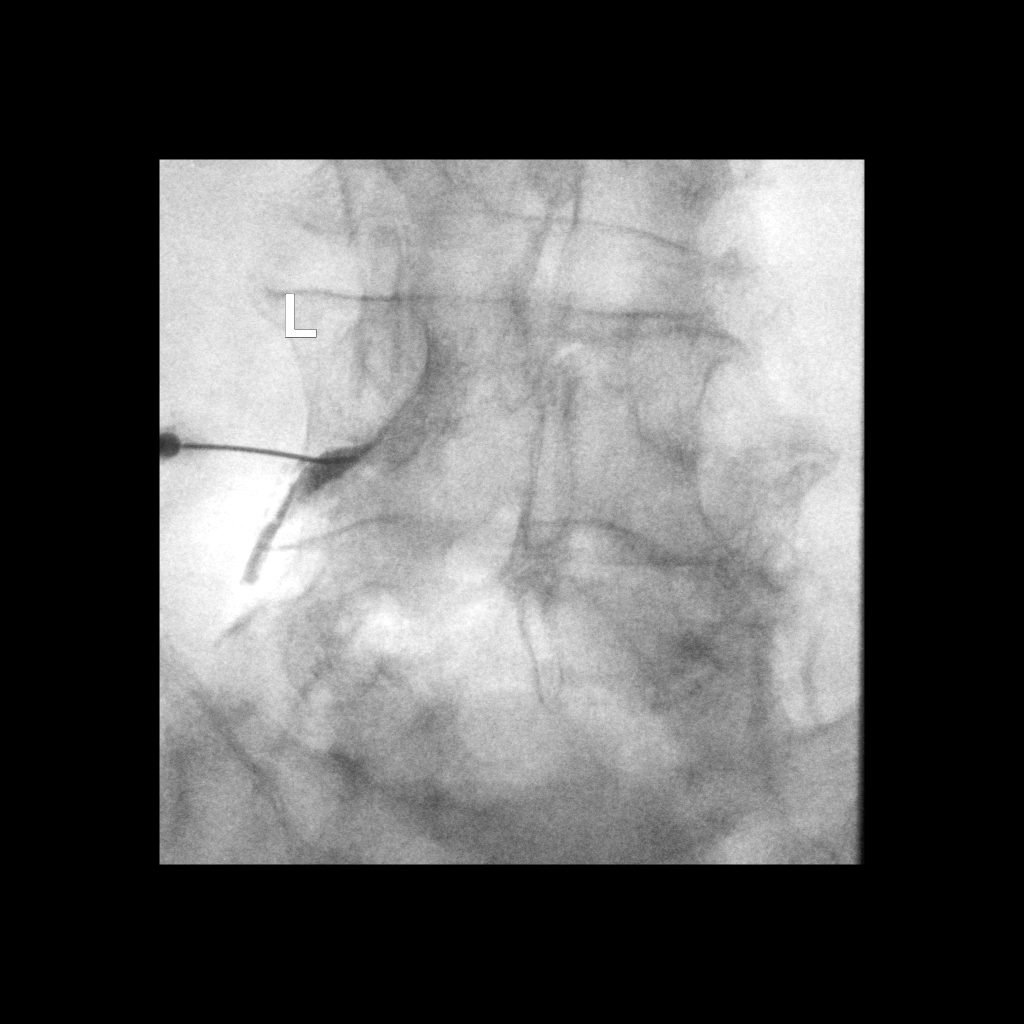

[3 of 3 positions shown; findings below may reference images not displayed]

IMPRESSION: 1. Technically successful left L4 selective nerve root block and
transforaminal epidural steroid injection

## 2021-01-25 MED ORDER — IOPAMIDOL (ISOVUE-M 300) INJECTION 61%
1.0000 mL | Freq: Once | INTRAMUSCULAR | Status: AC | PRN
  Administered 2021-01-25: 1 mL via EPIDURAL

## 2021-01-25 MED ORDER — METHYLPREDNISOLONE ACETATE 40 MG/ML INJ SUSP (RADIOLOG
80.0000 mg | Freq: Once | INTRAMUSCULAR | Status: AC
  Administered 2021-01-25: 80 mg via EPIDURAL

## 2021-01-25 NOTE — Discharge Instructions (Signed)

## 2021-02-02 ENCOUNTER — Other Ambulatory Visit: Payer: Self-pay

## 2021-02-02 ENCOUNTER — Telehealth: Payer: Self-pay | Admitting: Adult Health

## 2021-02-02 ENCOUNTER — Ambulatory Visit: Payer: Medicare HMO | Admitting: Adult Health

## 2021-02-02 ENCOUNTER — Ambulatory Visit: Payer: Medicare HMO | Admitting: Podiatry

## 2021-02-02 DIAGNOSIS — M79676 Pain in unspecified toe(s): Secondary | ICD-10-CM

## 2021-02-02 DIAGNOSIS — D689 Coagulation defect, unspecified: Secondary | ICD-10-CM

## 2021-02-02 DIAGNOSIS — I872 Venous insufficiency (chronic) (peripheral): Secondary | ICD-10-CM

## 2021-02-02 DIAGNOSIS — B351 Tinea unguium: Secondary | ICD-10-CM

## 2021-02-02 NOTE — Telephone Encounter (Signed)
Noted  

## 2021-02-02 NOTE — Telephone Encounter (Signed)
Pt cancelled appt due to having a neurosurgeon appt. Informed pt if have 3 no shows maybe dismissed from the practice. Pt verbalized understanding

## 2021-02-02 NOTE — Progress Notes (Signed)
This patient returns to my office for at risk foot care.  This patient requires this care by a professional since this patient will be at risk due to having venous insufficiency and coagulation defect.  Patient is taking coumadin.  This patient is unable to cut nails himself since the patient cannot reach his nails.These nails are painful walking and wearing shoes.This patient presents for at risk foot care today. Patient has developed dropfoot left foot from back problem.  General Appearance  Alert, conversant and in no acute stress.  Vascular  Dorsalis pedis and posterior tibial  pulses are palpable  bilaterally.  Capillary return is within normal limits  bilaterally. Temperature is within normal limits  bilaterally.  Neurologic  Senn-Weinstein monofilament wire test within normal limits  bilaterally. Muscle power within normal limits bilaterally.  Nails Thick disfigured discolored nails with subungual debris  from hallux to fifth toes bilaterally. No evidence of bacterial infection or drainage bilaterally.   Orthopedic  No limitations of motion  feet .  No crepitus or effusions noted.  No bony pathology or digital deformities noted.  Skin  normotropic skin  noted bilaterally.  No signs of infections or ulcers noted.   Asymptomatic porokeratosis sub 5th met left foot.  Onychomycosis  Pain in right toes  Pain in left toes  Porokeratosis left foot.  Consent was obtained for treatment procedures.   Mechanical debridement of nails 1-5  bilaterally performed with a nail nipper.  Filed with dremel without incident. Debride porokeratosis debrided with # 15 blade.   Return office visit   10 weeks                  Told patient to return for periodic foot care and evaluation due to potential at risk complications.   Gardiner Barefoot DPM

## 2021-02-04 ENCOUNTER — Other Ambulatory Visit: Payer: Self-pay

## 2021-02-04 ENCOUNTER — Ambulatory Visit: Payer: Medicare HMO | Admitting: Family Medicine

## 2021-02-04 VITALS — Wt 203.0 lb

## 2021-02-04 DIAGNOSIS — M5416 Radiculopathy, lumbar region: Secondary | ICD-10-CM | POA: Diagnosis not present

## 2021-02-04 DIAGNOSIS — M21372 Foot drop, left foot: Secondary | ICD-10-CM | POA: Diagnosis not present

## 2021-02-04 NOTE — Patient Instructions (Signed)
Thank you for coming in today.   Think about the AFO. (Ankle foot orthosis).   Schedule with the surgeon.   Let me know if you want the 3rd injection.

## 2021-02-04 NOTE — Progress Notes (Signed)
I, Peterson Lombard, LAT, ATC acting as a scribe for Lynne Leader, MD.  William Norman is a 80 y.o. male who presents to [place] at [facility] today for lumbar radiculopathy and L foot drop. Pt was last seen by Dr. Georgina Snell on 01/19/21 and a ESI was ordered at L4-L5 (performed on 12/12) and pt was referred for a neurosurgical consultation. Today, pt reports that his back and left leg pain have improved a bit.  He notes some numbness and tingling and occasional pain in the left lateral calf and left great toe.  He notes that overall pain has improved in his back and leg however he continues to have persistent left foot drop.  He had 2 epidural steroid injections which helped a lot but not resolved his foot drop.  Is referred to neurosurgery at the last visit but canceled the appointment but on reconsideration would like a referral now.  He still is undecided about the idea of an ankle-foot orthosis.  Dx imaging: 12/20/20 L-spine MRI             02/26/20 L-spine XR             02/15/07 L-spine XR  Pertinent review of systems: No fevers or chills  Relevant historical information: DVT history.  Small bowel obstruction history.   Exam:  Wt 203 lb (92.1 kg)    BMI 27.53 kg/m  General: Well Developed, well nourished, and in no acute distress.   MSK: Left foot significant weakness to dorsiflexion 2/3     Lab and Radiology Results EXAM: MRI LUMBAR SPINE WITHOUT CONTRAST   TECHNIQUE: Multiplanar, multisequence MR imaging of the lumbar spine was performed. No intravenous contrast was administered.   COMPARISON:  None.   FINDINGS: Segmentation:  5 lumbar type vertebrae   Alignment:  L4-5 grade 1 anterolisthesis.  Mild scoliosis   Vertebrae:  No fracture, evidence of discitis, or bone lesion.   Conus medullaris and cauda equina: Conus extends to the T12-L1 level. Conus appears somewhat gracile but this may be related to slice selection and minimal coverage.  Negative cauda equina.   Paraspinal and other soft tissues: Bladder wall thickening with trabeculation, cellules, diverticula.   Disc levels:   T12- L1: Ventral spondylosis   L1-L2: Disc narrowing and bulging with endplate spurring.   L2-L3: Disc narrowing and bulging with left foraminal protrusion. Asymmetric left facet spurring. Left foraminal impingement. Left subarticular recess narrowing although no definite L3 compression   L3-L4: Disc narrowing and bulging. Degenerative facet spurring and ligamentum flavum thickening. Advanced spinal stenosis. Moderate bilateral foraminal impingement   L4-L5: Disc narrowing and bulging. There is asymmetric right-sided height loss and ridging. Facet osteoarthritis with severe degeneration asymmetric to the right, with anterolisthesis. Triangular narrowing of the thecal sac crowding the bilateral L5 nerve roots. Advanced right foraminal impingement   L5-S1:Facet osteoarthritis with spurring and left anterior synovial cyst measuring 4 mm. This cyst is noncompressive. Mild disc space narrowing.   IMPRESSION: 1. Generalized spinal degeneration with scoliosis and L4-5 anterolisthesis. 2. Foraminal impingement on the left at L2-3, L3-4 and right L3-4, L4-5. 3. Spinal stenosis that is advanced at L3-4 and mild to moderate at L4-5. 4. Incidental findings of chronic bladder outlet obstruction.     Electronically Signed   By: Jorje Guild M.D.   On: 12/22/2020 08:07   I, Lynne Leader, personally (independently) visualized and performed the interpretation of the images attached in this note.  Assessment and Plan: 80 y.o. male with left lumbar radiculopathy with left foot drop.  Drop in leg pain thought to be due to L5 nerve compression.  Symptomatically he has improved with epidural steroid injections but still has persistent foot drop.  He is a poor surgical candidate but ultimately may need surgery to fully resolve or potentially  resolve his foot drop.  Advised an AFO but he would like to think about it further.  He would like to proceed to neurosurgery consultation which I think is a good idea.  Second referral placed today. Could proceed with third epidural steroid injection anytime.  Patient will let me know.   PDMP not reviewed this encounter. Orders Placed This Encounter  Procedures   Ambulatory referral to Neurosurgery    Referral Priority:   Routine    Referral Type:   Surgical    Referral Reason:   Specialty Services Required    Requested Specialty:   Neurosurgery    Number of Visits Requested:   1   No orders of the defined types were placed in this encounter.    Discussed warning signs or symptoms. Please see discharge instructions. Patient expresses understanding.   The above documentation has been reviewed and is accurate and complete Lynne Leader, M.D.  Total encounter time 20 minutes including face-to-face time with the patient and, reviewing past medical record, and charting on the date of service.   Treatment plan and options

## 2021-02-24 ENCOUNTER — Telehealth: Payer: Self-pay | Admitting: Cardiology

## 2021-02-24 DIAGNOSIS — M21372 Foot drop, left foot: Secondary | ICD-10-CM | POA: Diagnosis not present

## 2021-02-24 DIAGNOSIS — M5416 Radiculopathy, lumbar region: Secondary | ICD-10-CM | POA: Diagnosis not present

## 2021-02-24 DIAGNOSIS — M47816 Spondylosis without myelopathy or radiculopathy, lumbar region: Secondary | ICD-10-CM | POA: Diagnosis not present

## 2021-02-24 NOTE — Telephone Encounter (Signed)
Patient stopped by stating that he just left his neurosurgeon and is needing to have some type of surgery done ASAP. His surgeon said he needed this surgery done sooner rather than later and the patient is requesting a call back regarding his medication. [phone] or [phone]

## 2021-02-24 NOTE — Telephone Encounter (Signed)
Message left for pt to call back.

## 2021-02-25 NOTE — Telephone Encounter (Signed)
Spoke to the patient just now and he let me know that a neurosurgeon is wanting to operate on his foot. He is unsure of the procedure or how long he would need to hold his Eliquis. I advised that he have the surgeon to send [place] a pre-op form but he tells me that they were already going to do this. I told him we will keep an eye out for this and will get it put in as soon as possible for him.

## 2021-02-26 ENCOUNTER — Telehealth: Payer: Self-pay

## 2021-02-26 NOTE — Telephone Encounter (Signed)
° °  Pre-operative Risk Assessment    Patient Name: William Norman  DOB: 1940/04/25 MRN: [id number]      Request for Surgical Clearance    Procedure:   Lumbar Laminotomy  Date of Surgery:  Clearance TBD                                 Surgeon:  Dr. Consuella Lose Surgeon's Group or Practice Name:  [facility] Phone number:  [phone] Fax number:  [phone] Attention Nikki   Type of Clearance Requested:   - Medical    Type of Anesthesia:  General    Additional requests/questions:   Does patient need to hold any medications prior to surgery?  Signed, Lowella Grip   02/26/2021, 3:45 PM

## 2021-03-01 ENCOUNTER — Other Ambulatory Visit: Payer: Self-pay | Admitting: Neurosurgery

## 2021-03-02 NOTE — Telephone Encounter (Signed)
° °  Primary Cardiologist: Shirlee More, MD  Chart reviewed as part of pre-operative protocol coverage. Given past medical history and time since last visit, based on ACC/AHA guidelines, Stokes Rattigan would be at acceptable risk for the planned procedure without further cardiovascular testing.   Patient with diagnosis of AFib on Eliquis for anticoagulation.     Procedure: Lumbar Laminotomy Date of procedure: TBD   CHA2DS2-VASc Score = 7 This indicates a 11.2% annual risk of stroke. The patient's score is based upon: CHF History: 1 HTN History: 1 Diabetes History: 0 Stroke History: 2 Vascular Disease History: 1 Age Score: 2 Gender Score: 0   CrCl 94 mL/min (85 mL/min with adjusted body weight) Platelet count 350k   Hx of DVTs in 1998 and 2002.   Presented 02/2020 with stroke felt to be secondary to afib when warfarin was held for cystoscopy. He was changed to Eliquis at that time.   Previously requested a 2 day hold for a lumbar ESI in 12/2020. Dr. Bettina Gavia agreed with request for a 2 day hold without Lovenox bridge.    Per office protocol, recommend holding Eliquis for 3 days prior to lumbar procedure, without a Lovenox bridge. He should resume anticoag as soon as safely possible.  I will route this recommendation to the requesting party via Epic fax function and remove from pre-op pool.  Please call with questions.  Jossie Ng. Shawndale Kilpatrick NP-C    03/02/2021, 12:03 PM [address] [address] Office [phone] Fax [phone]

## 2021-03-02 NOTE — Telephone Encounter (Addendum)
Patient with diagnosis of AFib on Eliquis for anticoagulation.    Procedure: Lumbar Laminotomy Date of procedure: TBD  CHA2DS2-VASc Score = 7 This indicates a 11.2% annual risk of stroke. The patient's score is based upon: CHF History: 1 HTN History: 1 Diabetes History: 0 Stroke History: 2 Vascular Disease History: 1 Age Score: 2 Gender Score: 0   CrCl 94 mL/min (85 mL/min with adjusted body weight) Platelet count 350k  Hx of DVTs in 1998 and 2002.  Presented 02/2020 with stroke felt to be secondary to afib when warfarin was held for cystoscopy. He was changed to Eliquis at that time.  Previously requested a 2 day hold for a lumbar ESI in 12/2020. Dr. Bettina Gavia agreed with request for a 2 day hold without Lovenox bridge.   Per office protocol, recommend holding Eliquis for 3 days prior to lumbar procedure, without a Lovenox bridge. He should resume anticoag as soon as safely possible.

## 2021-03-04 ENCOUNTER — Other Ambulatory Visit: Payer: Self-pay | Admitting: Neurosurgery

## 2021-03-08 ENCOUNTER — Ambulatory Visit: Payer: Medicare HMO | Admitting: Adult Health

## 2021-03-09 ENCOUNTER — Other Ambulatory Visit: Payer: Self-pay

## 2021-03-09 ENCOUNTER — Ambulatory Visit: Payer: Medicare HMO | Admitting: Cardiology

## 2021-03-09 ENCOUNTER — Encounter: Payer: Self-pay | Admitting: Cardiology

## 2021-03-09 VITALS — BP 132/82 | HR 77 | Ht 72.0 in | Wt 206.0 lb

## 2021-03-09 DIAGNOSIS — Z7901 Long term (current) use of anticoagulants: Secondary | ICD-10-CM | POA: Diagnosis not present

## 2021-03-09 DIAGNOSIS — I110 Hypertensive heart disease with heart failure: Secondary | ICD-10-CM

## 2021-03-09 DIAGNOSIS — I10 Essential (primary) hypertension: Secondary | ICD-10-CM | POA: Diagnosis not present

## 2021-03-09 DIAGNOSIS — R413 Other amnesia: Secondary | ICD-10-CM

## 2021-03-09 DIAGNOSIS — I5032 Chronic diastolic (congestive) heart failure: Secondary | ICD-10-CM

## 2021-03-09 DIAGNOSIS — I4821 Permanent atrial fibrillation: Secondary | ICD-10-CM

## 2021-03-09 DIAGNOSIS — I2510 Atherosclerotic heart disease of native coronary artery without angina pectoris: Secondary | ICD-10-CM | POA: Diagnosis not present

## 2021-03-09 DIAGNOSIS — E782 Mixed hyperlipidemia: Secondary | ICD-10-CM

## 2021-03-09 MED ORDER — NITROGLYCERIN 0.4 MG SL SUBL: SUBLINGUAL_TABLET | SUBLINGUAL | 1 refills | Status: AC

## 2021-03-09 NOTE — Progress Notes (Signed)
Cardiology Office Note:    Date:  03/09/2021   ID:  William Norman, DOB August 05, 1940, MRN [id number]  PCP:  Vivi Barrack, MD  Cardiologist:  Shirlee More, MD    Referring MD: Vivi Barrack, MD    ASSESSMENT:    1. Long term current use of anticoagulant therapy   2. Permanent atrial fibrillation ([place])   3. Hypertensive heart disease with chronic diastolic congestive heart failure ([place])   4. Essential hypertension   5. Memory difficulties   6. Coronary artery disease involving native coronary artery of native heart without angina pectoris   7. Mixed hyperlipidemia    PLAN:    In order of problems listed above:  Overall he is doing well tolerates his anticoagulant without bleeding and understands management perioperatively.  Unfortunately had a stroke the last time anticoagulation was withdrawn he was with warfarin I think the window of opportunity is narrowed with a direct anticoagulant and I do not think bridging is an option with the nature of his surgery spine surgery.  He is aware of the risk Stable rate controlled atrial fibrillation he is not on suppressive treatment tells me he will stay overnight in the hospital I put him on a monitor to bed and do an EKG postoperative day 1 if any problems contact [facility] Stable BP at target heart failure is compensated continue his current low-dose loop diuretic He request referral to neurology for memory change Stable CAD no anginal discomfort continue medical treatment including his low-dose beta-blocker statin Continue statin last lipid profile 07/20/2020 LDL was 60   Next appointment: 6 months   Medication Adjustments/Labs and Tests Ordered: Current medicines are reviewed at length with the patient today.  Concerns regarding medicines are outlined above.  Orders Placed This Encounter  Procedures   Ambulatory referral to Neurology   EKG 12-Lead   No orders of the defined types were placed in this  encounter.   Chief Complaint  Patient presents with   Pre-op Exam   Atrial Fibrillation   Anticoagulation    History of Present Illness:    William Norman is a 81 y.o. male with a hx of chronic atrial fibrillation with chronic anticoagulation hypertensive heart disease with heart failure hyperlipidemia CAD with remote PCI to the LAD in 1992 history of deep vein thrombosis and most recent acute embolic stroke in January 2021 with withdrawal of chronic anticoagulant therapy and recurrent small bowel obstruction with surgical intervention IVR drainage of hematoma postoperatively last seen 11/25/2020 with decompensated heart failure due to fluid overload from hospitalization.  Compliance with diet, lifestyle and medications: Yes  He has been instructed to hold his anticoagulant 3 days prior to spine surgery and generally reinstitute 2 days after. He thinks he is finally recovered from his last hospitalization He has chronic lower extremity edema with venous insufficiency no shortness of breath orthopnea chest pain palpitation or syncope He has had no bleeding from his anticoagulant. He tolerates his statin without muscle pain or weakness He is concerned that he has had a change in memory and request made a referral to neurology electively  He is scheduled for spine surgery 03/18/2021 Surgeon Role  Consuella Lose, MD Primary   Procedure Laterality Anesthesia  LAMINOTOMY, FORAMINOTOMY, LT L34 Left General     Past Medical History:  Diagnosis Date   BPH (benign prostatic hypertrophy)    BPH associated with nocturia 08/29/2013   BPH with obstruction/lower urinary tract symptoms 08/14/2019   CAD (  coronary artery disease)    CAD (coronary artery disease), native coronary artery    PTCA of RCA 1986, PTCA of LAD 1992,  Negative treadmill Cardiolite 2011    Chronic dermatitis of hands 10/12/2015   Chronic venous insufficiency    Coagulation disorder ([place]) 10/26/2018   DVT (deep  venous thrombosis) ([facility])    Dyslipidemia    Eczema    Erectile dysfunction 07/11/2012   Essential hypertension    GERD 08/04/2006       GERD (gastroesophageal reflux disease)    Hyperglycemia 07/08/2019   Hyperlipidemia    Hypertension    Incomplete emptying of bladder 08/14/2019   Long term current use of anticoagulant therapy    Lumbar disc disease    Overweight (BMI 25.0-29.9) 06/18/2015   Persistent atrial fibrillation ([place]) 04/10/2018   Personal history of DVT (deep vein thrombosis)    Initially in 1998 with recurrence in 2002 now on chronic warfarin    Soft tissue lesion of foot 08/27/2015   Stroke [facility])    Urinary tract infection with hematuria 08/14/2019    Past Surgical History:  Procedure Laterality Date   APPLICATION OF WOUND VAC  09/02/2020   Procedure: APPLICATION OF WOUND VAC;  Surgeon: Donnie Mesa, MD;  Location: [place];  Service: General;;   CARDIAC CATHETERIZATION     CARPAL TUNNEL RELEASE  04/21/2011   Procedure: CARPAL TUNNEL RELEASE;  Surgeon: Tennis Must, MD;  Location: [place];  Service: Orthopedics;  Laterality: Left;   CARPAL TUNNEL RELEASE  05/23/2011   Procedure: CARPAL TUNNEL RELEASE;  Surgeon: Tennis Must, MD;  Location: [place];  Service: Orthopedics;  Laterality: Right;   COLON SURGERY  2010 and 2011 colostomy reversal   CORONARY ANGIOPLASTY     EYE SURGERY     repair of macular hole   LAPAROTOMY N/A 09/02/2020   Procedure: EXPLORATORY LAPAROTOMY, LYSIS OF ADHESIONS ,BOWEL RESECTION;  Surgeon: Donnie Mesa, MD;  Location: [place];  Service: General;  Laterality: N/A;   NASAL SEPTUM SURGERY     precutaneous transluminal coronary angioplasty     right hernia     TONSILLECTOMY AND ADENOIDECTOMY      Current Medications: Current Meds  Medication Sig   acetaminophen (ACETAMINOPHEN 8 HOUR) 650 MG CR tablet Take 1 tablet (650 mg total) by mouth every 8 (eight) hours as needed for pain.   apixaban (ELIQUIS) 5 MG  TABS tablet Take 1 tablet (5 mg total) by mouth 2 (two) times daily.   atenolol (TENORMIN) 25 MG tablet Take 1 tablet (25 mg total) by mouth daily.   atorvastatin (LIPITOR) 40 MG tablet TAKE 1/2 TABLET BY MOUTH DAILY   Coenzyme Q10 (COQ10) 200 MG CAPS Take 1 capsule by mouth daily.   Cyanocobalamin (VITAMIN B-12) 1000 MCG SUBL Place 1 tablet (1,000 mcg total) under the tongue daily. (Patient taking differently: Take 1 tablet by mouth daily.)   furosemide (LASIX) 20 MG tablet Take 1 tablet (20 mg total) by mouth daily.   hydroxypropyl methylcellulose / hypromellose (ISOPTO TEARS / GONIOVISC) 2.5 % ophthalmic solution Place 1 drop into both eyes 2 (two) times daily as needed for dry eyes.   oxymetazoline (AFRIN) 0.05 % nasal spray Place 2 sprays into both nostrils at bedtime as needed for congestion.   [DISCONTINUED] nitroGLYCERIN (NITROSTAT) 0.4 MG SL tablet Dissolve 1 tablet under the tongue every 5 minutes as needed (Patient taking differently: Dissolve 1 tablet under the tongue every 5 minutes as  needed for chest pain)     Allergies:   Other and Simvastatin   Social History   Socioeconomic History   Marital status: Married    Spouse name: Not on file   Number of children: 3   Years of education: 16   Highest education level: Not on file  Occupational History   Occupation: [employer]  Tobacco Use   Smoking status: Former    Packs/day: 1.00    Years: 60.00    Pack years: 60.00    Types: Cigarettes    Quit date: 03/25/1998    Years since quitting: 22.9   Smokeless tobacco: Never  Vaping Use   Vaping Use: Never used  Substance and Sexual Activity   Alcohol use: Yes    Alcohol/week: 2.0 - 3.0 standard drinks    Types: 2 - 3 Glasses of wine per week    Comment: 1 glass of red wine every 2-3 night    Drug use: No   Sexual activity: Not on file  Other Topics Concern   Not on file  Social History Narrative   Fun: Working outside in yard, cutting wood    Denies abuse and feels safe at  home.    Right handed   Lives at home with wife, daughter, granddaughter, and son (periodically)   Social Determinants of Health   Financial Resource Strain: Low Risk    Difficulty of Paying Living Expenses: Not very hard  Food Insecurity: Not on file  Transportation Needs: Not on file  Physical Activity: Not on file  Stress: Not on file  Social Connections: Not on file     Family History: The patient's family history includes AAA (abdominal aortic aneurysm) in his brother; Atrial fibrillation in his father and mother; Heart attack in his father; Heart disease in his father and mother; Stroke in his mother. ROS:   Please see the history of present illness.    All other systems reviewed and are negative.  EKGs/Labs/Other Studies Reviewed:    The following studies were reviewed today:  EKG:  EKG ordered today and personally reviewed.  The ekg ordered today demonstrates rate controlled atrial fibrillation otherwise normal EKG  Recent Labs: 07/20/2020: TSH 3.18 09/15/2020: Magnesium 2.0 10/02/2020: ALT 79 10/16/2020: Hemoglobin 11.6; Platelets 380 11/17/2020: BUN 16; Creatinine, Ser 0.80; Potassium 4.6; Sodium 141  Recent Lipid Panel    Component Value Date/Time   CHOL 124 07/20/2020 1450   CHOL 130 12/03/2019 1509   TRIG 54 09/14/2020 0353   TRIG 68 01/10/2006 1108   HDL 49.60 07/20/2020 1450   HDL 48 12/03/2019 1509   CHOLHDL 2 07/20/2020 1450   VLDL 13.8 07/20/2020 1450   LDLCALC 60 07/20/2020 1450   LDLCALC 66 12/03/2019 1509   LDLDIRECT 131.5 04/22/2010 1018    Physical Exam:    VS:  BP 132/82 (BP Location: Left Arm)    Pulse 77    Ht 6' (1.829 m)    Wt 206 lb (93.4 kg)    SpO2 98%    BMI 27.94 kg/m     Wt Readings from Last 3 Encounters:  03/09/21 206 lb (93.4 kg)  02/04/21 203 lb (92.1 kg)  01/19/21 203 lb 12.8 oz (92.4 kg)     GEN:  Well nourished, well developed in no acute distress HEENT: Normal NECK: No JVD; No carotid bruits LYMPHATICS: No  lymphadenopathy CARDIAC: Irregular rate and rhythm RRR, no murmurs, rubs, gallops RESPIRATORY:  Clear to auscultation without rales, wheezing or rhonchi  ABDOMEN: Soft, non-tender, non-distended MUSCULOSKELETAL:  No edema; No deformity  SKIN: Warm and dry NEUROLOGIC:  Alert and oriented x 3 PSYCHIATRIC:  Normal affect    Signed, Shirlee More, MD  03/09/2021 1:35 PM    [facility]

## 2021-03-09 NOTE — Patient Instructions (Signed)

## 2021-03-12 ENCOUNTER — Telehealth: Payer: Self-pay | Admitting: Family Medicine

## 2021-03-12 NOTE — Telephone Encounter (Signed)
Can we clarify with patient? Normally cardiology sends this in.  [name]. [name], MD 03/12/2021 3:27 PM

## 2021-03-12 NOTE — Progress Notes (Signed)
Surgical Instructions    Your procedure is scheduled on 03/18/21.  Report to [facility] Main Entrance "A" at 10:45 A.M., then check in with the Admitting office.  Call this number if you have problems the morning of surgery:  [phone]   If you have any questions prior to your surgery date call [phone]: Open Monday-Friday 8am-4pm    Remember:  Do not eat or drink after midnight the night before your surgery    Take these medicines the morning of surgery with A SIP OF WATER  acetaminophen (ACETAMINOPHEN 8 HOUR)  atenolol (TENORMIN)  atorvastatin (LIPITOR)  IF NEEDED: hydroxypropyl methylcellulose / hypromellose (ISOPTO TEARS / GONIOVISC)  As of today, STOP taking any Aspirin (unless otherwise instructed by your surgeon) Aleve, Naproxen, Ibuprofen, Motrin, Advil, Goody's, BC's, all herbal medications, fish oil, and all vitamins.  Please follow your surgeons instructions on when to stop taking apixaban (ELIQUIS).  After your COVID test   You are not required to quarantine however you are required to wear a well-fitting mask when you are out and around people not in your household.  If your mask becomes wet or soiled, replace with a new one.  Wash your hands often with soap and water for 20 seconds or clean your hands with an alcohol-based hand sanitizer that contains at least 60% alcohol.  Do not share personal items.  Notify your provider: if you are in close contact with someone who has COVID  or if you develop a fever of 100.4 or greater, sneezing, cough, sore throat, shortness of breath or body aches.           Do not wear jewelry or makeup Do not wear lotions, powders, perfumes/colognes, or deodorant. Do not shave 48 hours prior to surgery.  Men may shave face and neck. Do not bring valuables to the hospital. DO Not wear nail polish, gel polish, artificial nails, or any other type of covering on natural nails (fingers and toes) If you have artificial nails or gel  coating that need to be removed by a nail salon, please have this removed prior to surgery. Artificial nails or gel coating may interfere with anesthesia's ability to adequately monitor your vital signs.             [facility] is not responsible for any belongings or valuables.  Do NOT Smoke (Tobacco/Vaping)  24 hours prior to your procedure  If you use a CPAP at night, you may bring your mask for your overnight stay.   Contacts, glasses, hearing aids, dentures or partials may not be worn into surgery, please bring cases for these belongings   For patients admitted to the hospital, discharge time will be determined by your treatment team.   Patients discharged the day of surgery will not be allowed to drive home, and someone needs to stay with them for 24 hours.  NO VISITORS WILL BE ALLOWED IN PRE-OP WHERE PATIENTS ARE PREPPED FOR SURGERY.  ONLY 1 SUPPORT PERSON MAY BE PRESENT IN THE WAITING ROOM WHILE YOU ARE IN SURGERY.  IF YOU ARE TO BE ADMITTED, ONCE YOU ARE IN YOUR ROOM YOU WILL BE ALLOWED TWO (2) VISITORS. 1 (ONE) VISITOR MAY STAY OVERNIGHT BUT MUST ARRIVE TO THE ROOM BY 8pm.  Minor children may have two parents present. Special consideration for safety and communication needs will be reviewed on a case by case basis.  Special instructions:    Oral Hygiene is also important to reduce your risk of infection.  Remember - BRUSH YOUR TEETH THE MORNING OF SURGERY WITH YOUR REGULAR TOOTHPASTE   [place]- Preparing For Surgery  Before surgery, you can play an important role. Because skin is not sterile, your skin needs to be as free of germs as possible. You can reduce the number of germs on your skin by washing with CHG (chlorahexidine gluconate) Soap before surgery.  CHG is an antiseptic cleaner which kills germs and bonds with the skin to continue killing germs even after washing.     Please do not use if you have an allergy to CHG or antibacterial soaps. If your skin becomes  reddened/irritated stop using the CHG.  Do not shave (including legs and underarms) for at least 48 hours prior to first CHG shower. It is OK to shave your face.  Please follow these instructions carefully.     Shower the NIGHT BEFORE SURGERY and the MORNING OF SURGERY with CHG Soap.   If you chose to wash your hair, wash your hair first as usual with your normal shampoo. After you shampoo, rinse your hair and body thoroughly to remove the shampoo.  Then [employer] and genitals (private parts) with your normal soap and rinse thoroughly to remove soap.  After that Use CHG Soap as you would any other liquid soap. You can apply CHG directly to the skin and wash gently with a scrungie or a clean washcloth.   Apply the CHG Soap to your body ONLY FROM THE NECK DOWN.  Do not use on open wounds or open sores. Avoid contact with your eyes, ears, mouth and genitals (private parts). Wash Face and genitals (private parts)  with your normal soap.   Wash thoroughly, paying special attention to the area where your surgery will be performed.  Thoroughly rinse your body with warm water from the neck down.  DO NOT shower/wash with your normal soap after using and rinsing off the CHG Soap.  Pat yourself dry with a CLEAN TOWEL.  Wear CLEAN PAJAMAS to bed the night before surgery  Place CLEAN SHEETS on your bed the night before your surgery  DO NOT SLEEP WITH PETS.   Day of Surgery: Take a shower with CHG soap. Wear Clean/Comfortable clothing the morning of surgery Do not apply any deodorants/lotions.   Remember to brush your teeth WITH YOUR REGULAR TOOTHPASTE.   Please read over the following fact sheets that you were given.

## 2021-03-12 NOTE — Telephone Encounter (Signed)
LMTCB

## 2021-03-12 NOTE — Telephone Encounter (Signed)
Pt is currently out of his meds and is needing a refill. Prescribing provider: Eduardo Osier, MD  Pt states this was prescribed while he was in hospital last year.    apixaban (ELIQUIS) 5 MG TABS tablet

## 2021-03-12 NOTE — Telephone Encounter (Signed)
Patient returned the call William Norman left for him on Friday. Pt wants a call back on Monday 03/15/2021 in the morning. Patient needs apixaban (ELIQUIS) 5 MG TABS tablet refill. Pt will run out on Monday 03/15/2021.      Thank You!  [facility].

## 2021-03-13 NOTE — Telephone Encounter (Signed)
Last refills by Edwin Dada, MD

## 2021-03-15 ENCOUNTER — Other Ambulatory Visit: Payer: Self-pay

## 2021-03-15 ENCOUNTER — Encounter (HOSPITAL_COMMUNITY)
Admission: RE | Admit: 2021-03-15 | Discharge: 2021-03-15 | Disposition: A | Discharge status: Home / Self Care | Payer: Medicare HMO | Source: Ambulatory Visit | Attending: Neurosurgery | Admitting: Neurosurgery

## 2021-03-15 ENCOUNTER — Encounter (HOSPITAL_COMMUNITY): Payer: Self-pay

## 2021-03-15 ENCOUNTER — Other Ambulatory Visit: Payer: Self-pay | Admitting: *Deleted

## 2021-03-15 VITALS — BP 149/96 | HR 81 | Temp 98.0°F | Resp 18 | Ht 72.0 in | Wt 207.5 lb

## 2021-03-15 DIAGNOSIS — Z86718 Personal history of other venous thrombosis and embolism: Secondary | ICD-10-CM | POA: Insufficient documentation

## 2021-03-15 DIAGNOSIS — I872 Venous insufficiency (chronic) (peripheral): Secondary | ICD-10-CM | POA: Diagnosis not present

## 2021-03-15 DIAGNOSIS — Z01812 Encounter for preprocedural laboratory examination: Secondary | ICD-10-CM | POA: Diagnosis not present

## 2021-03-15 DIAGNOSIS — E785 Hyperlipidemia, unspecified: Secondary | ICD-10-CM | POA: Insufficient documentation

## 2021-03-15 DIAGNOSIS — Z7901 Long term (current) use of anticoagulants: Secondary | ICD-10-CM | POA: Insufficient documentation

## 2021-03-15 DIAGNOSIS — Z20822 Contact with and (suspected) exposure to covid-19: Secondary | ICD-10-CM | POA: Insufficient documentation

## 2021-03-15 DIAGNOSIS — Z8673 Personal history of transient ischemic attack (TIA), and cerebral infarction without residual deficits: Secondary | ICD-10-CM | POA: Diagnosis not present

## 2021-03-15 DIAGNOSIS — N138 Other obstructive and reflux uropathy: Secondary | ICD-10-CM | POA: Insufficient documentation

## 2021-03-15 DIAGNOSIS — I493 Ventricular premature depolarization: Secondary | ICD-10-CM | POA: Insufficient documentation

## 2021-03-15 DIAGNOSIS — N401 Enlarged prostate with lower urinary tract symptoms: Secondary | ICD-10-CM | POA: Insufficient documentation

## 2021-03-15 DIAGNOSIS — M21372 Foot drop, left foot: Secondary | ICD-10-CM | POA: Diagnosis not present

## 2021-03-15 DIAGNOSIS — I4891 Unspecified atrial fibrillation: Secondary | ICD-10-CM | POA: Insufficient documentation

## 2021-03-15 DIAGNOSIS — M48061 Spinal stenosis, lumbar region without neurogenic claudication: Secondary | ICD-10-CM | POA: Diagnosis not present

## 2021-03-15 DIAGNOSIS — I2510 Atherosclerotic heart disease of native coronary artery without angina pectoris: Secondary | ICD-10-CM | POA: Diagnosis not present

## 2021-03-15 DIAGNOSIS — Z955 Presence of coronary angioplasty implant and graft: Secondary | ICD-10-CM | POA: Insufficient documentation

## 2021-03-15 DIAGNOSIS — M4316 Spondylolisthesis, lumbar region: Secondary | ICD-10-CM | POA: Diagnosis not present

## 2021-03-15 DIAGNOSIS — K219 Gastro-esophageal reflux disease without esophagitis: Secondary | ICD-10-CM | POA: Diagnosis not present

## 2021-03-15 DIAGNOSIS — Z01818 Encounter for other preprocedural examination: Secondary | ICD-10-CM

## 2021-03-15 DIAGNOSIS — I10 Essential (primary) hypertension: Secondary | ICD-10-CM | POA: Diagnosis not present

## 2021-03-15 HISTORY — DX: Cardiac arrhythmia, unspecified: I49.9

## 2021-03-15 LAB — SURGICAL PCR SCREEN
MRSA, PCR: NEGATIVE
Staphylococcus aureus: NEGATIVE

## 2021-03-15 LAB — CBC
HCT: 48.5 % (ref 39.0–52.0)
Hemoglobin: 15.1 g/dL (ref 13.0–17.0)
MCH: 27.6 pg (ref 26.0–34.0)
MCHC: 31.1 g/dL (ref 30.0–36.0)
MCV: 88.7 fL (ref 80.0–100.0)
Platelets: 215 10*3/uL (ref 150–400)
RBC: 5.47 MIL/uL (ref 4.22–5.81)
RDW: 19.5 % — ABNORMAL HIGH (ref 11.5–15.5)
WBC: 8.8 10*3/uL (ref 4.0–10.5)
nRBC: 0 % (ref 0.0–0.2)

## 2021-03-15 LAB — BASIC METABOLIC PANEL
Anion gap: 10 (ref 5–15)
BUN: 17 mg/dL (ref 8–23)
CO2: 29 mmol/L (ref 22–32)
Calcium: 9.6 mg/dL (ref 8.9–10.3)
Chloride: 103 mmol/L (ref 98–111)
Creatinine, Ser: 0.8 mg/dL (ref 0.61–1.24)
GFR, Estimated: >60 mL/min (ref >60)
Glucose, Bld: 99 mg/dL (ref 70–99)
Potassium: 4.6 mmol/L (ref 3.5–5.1)
Sodium: 142 mmol/L (ref 135–145)

## 2021-03-15 MED ORDER — APIXABAN 5 MG PO TABS: 5.0000 mg | ORAL_TABLET | Freq: Two times a day (BID) | ORAL | 11 refills | Status: DC

## 2021-03-15 NOTE — Progress Notes (Signed)
PCP - Dimas Chyle, MD Cardiologist - Shirlee More, MD  PPM/ICD - denies Device Orders - n/a Rep Notified - n/a  Chest x-ray - n/a EKG - 03/09/2021 Stress Test - denies ECHO - 04/18/2018 Cardiac Cath - > 20 yrs ago  Sleep Study - denies CPAP - n/a  Fasting Blood Sugar - n/a  Blood Thinner Instructions: Eliquis - last dose on 03/15/2021 per patient  Aspirin Instructions: Patient was instructed: As of today, STOP taking any Aspirin (unless otherwise instructed by your surgeon) Aleve, Naproxen, Ibuprofen, Motrin, Advil, Goody's, BC's, all herbal medications, fish oil, and all vitamins.  ERAS Protcol - n/a  COVID TEST- done in PAT on 03/15/2021   Anesthesia review: yes - cardiac history  Patient denies shortness of breath, fever, cough and chest pain at PAT appointment   All instructions explained to the patient, with a verbal understanding of the material. Patient agrees to go over the instructions while at home for a better understanding. Patient also instructed to self quarantine after being tested for COVID-19. The opportunity to ask questions was provided.

## 2021-03-15 NOTE — Telephone Encounter (Signed)
Rx send to pharmacy. Spoke with patient stated last prescription done by hospital  Dr unsure name with 1 year refilled  Due to Stroke.

## 2021-03-15 NOTE — Telephone Encounter (Signed)
Please see phone note. I am fine refilling it but I want to make sure that he is not getting it duplicated by another doctor.

## 2021-03-16 LAB — SARS CORONAVIRUS 2 (TAT 6-24 HRS): SARS Coronavirus 2: NEGATIVE

## 2021-03-16 NOTE — Anesthesia Preprocedure Evaluation (Addendum)
Anesthesia Evaluation  Patient identified by MRN, date of birth, ID band Patient awake    Reviewed: Allergy & Precautions, NPO status , Patient's Chart, lab work & pertinent test results  History of Anesthesia Complications Negative for: history of anesthetic complications  Airway Mallampati: III  TM Distance: >3 FB Neck ROM: Full    Dental  (+) Edentulous Upper,    Pulmonary former smoker,    breath sounds clear to auscultation       Cardiovascular hypertension, Pt. on medications and Pt. on home beta blockers (-) angina+ CAD  + dysrhythmias Atrial Fibrillation  Rhythm:Irregular  1. The left ventricle has normal systolic function with an ejection  fraction of 60-65%. The cavity size was normal. Left ventricular  diastology could not be evaluated.  2. The right ventricle has normal systolic function. The cavity was  normal. There is no increase in right ventricular wall thickness.  3. Left atrial size was moderate to severe.  4. Right atrial size was moderate to severe.  5. The mitral valve is normal in structure. Mitral valve regurgitation is  moderate to severe by color flow Doppler.  6. The tricuspid valve is normal in structure.  7. The aortic valve is normal in structure. Aortic valve regurgitation is  mild by color flow Doppler.  8. The pulmonic valve was normal in structure.    Neuro/Psych neg Seizures Left foot drop CVA, No Residual Symptoms negative psych ROS   GI/Hepatic Neg liver ROS, GERD  ,Lab Results      Component                Value               Date                      ALT                      79 (H)              10/02/2020                AST                      91 (H)              10/02/2020                ALKPHOS                  123                 09/17/2020                BILITOT                  0.7                 10/02/2020              Endo/Other  negative endocrine ROS   Renal/GU negative Renal ROSLab Results      Component                Value               Date                      CREATININE  0.80                03/15/2021                Musculoskeletal  (+) Arthritis ,   Abdominal   Peds  Hematology  (+) Blood dyscrasia, , eliquis held x 3 days  Lab Results      Component                Value               Date                      WBC                      8.8                 03/15/2021                HGB                      15.1                03/15/2021                HCT                      48.5                03/15/2021                MCV                      88.7                03/15/2021                PLT                      215                 03/15/2021              Anesthesia Other Findings History includes former smoker (quit 03/25/98), HTN, HLD, CAD ("PTCA of the LAD July 1992, PTCA of the RCA February 1986"), afib (diagnosed ~ 2020), CVA (CVA 02/16/20 in setting of subtherapeutic INR due to warfarin being on hold for surgery; switched to Eliquis), DVT (1998; LLE DVT 01/05/01), GERD, BPH (with LUTS), diverticulitis (s/p right IHR and sigmoid colectomy with colostomy for perforation 08/07/08; s/p exploratory laparotomy for LOA, closure of colostomy, repair of parastomal ventral hernia 05/11/09; SBO due to partial intermittent closed loop obstruction in the LUQ, s/p exploratory laparotomy, LOA, small bowel resection, Wound VAC dressing 09/02/20), chronic venous insufficiency.  [facility] admission 08/28/20-09/18/20 for acute abdominal pain with CT concerning for SBO with question of closed-loop anatomy. General surgeon consulted. No peritonitis on exam, so medical management recommended initially; however, he made little progress and unable to take enteral nutrition, so ultimately taken to the OR on 09/02/20 exploratory laparotomy, LOA, small bowel resection. He had post-op ileus, elevated LFTs, and leukocytosis (WBC peak 18K)  prompting CT abd/pelvis on 09/09/20 showing a suspected large anterior pelvis hematoma. S/p CT guided aspiration on 09/10/20 which was consistent with liquefied hematoma (on Zosyn until cultures back: no growth). LFTs and WBC improved. Diet advanced. Eliquis resumed  for afib. He did require treatment with Rocephin for UTI. PT/OT recommended SNF for on-going rehab services. Discharged to [facility].  Last cardiology evaluation with Dr. Bettina Gavia on 03/09/21. He wrote, " 1. Overall he is doing well tolerates his anticoagulant without bleeding and understands management perioperatively. Unfortunately had a stroke the last time anticoagulation was withdrawn he was with warfarin I think the window of opportunity is narrowed with a direct anticoagulant and I do not think bridging is an option with the nature of his surgery spine surgery. He is aware of the risk 2. Stable rate controlled atrial fibrillation he is not on suppressive treatment tells me he will stay overnight in the hospital I put him on a monitor to bed and do an EKG postoperative day 1 if any problems contact [place] [facility].." He advised anticoagulation can be held for 3 days prior to surgery. Last Eliquis 03/15/21.   Reproductive/Obstetrics                            Anesthesia Physical Anesthesia Plan  ASA: 3  Anesthesia Plan: General   Post-op Pain Management:    Induction:   PONV Risk Score and Plan: 2 and Ondansetron and Dexamethasone  Airway Management Planned: Oral ETT  Additional Equipment: None  Intra-op Plan:   Post-operative Plan: Extubation in OR  Informed Consent: I have reviewed the patients History and Physical, chart, labs and discussed the procedure including the risks, benefits and alternatives for the proposed anesthesia with the patient or authorized representative who has indicated his/her understanding and acceptance.     Dental advisory given  Plan Discussed with:  CRNA  Anesthesia Plan Comments: (PAT note written 03/16/2021 by Myra Gianotti, PA-C. )       Anesthesia Quick Evaluation

## 2021-03-16 NOTE — Progress Notes (Signed)
Anesthesia Chart Review:  Case: [id number] Date/Time: 03/18/21 1230   Procedure: LAMINOTOMY, FORAMINOTOMY, LT L34 (Left)   Anesthesia type: General   Pre-op diagnosis: FOOT DROP, LEFT   [facility] ROOM [facility] / [facility]   Surgeons: Consuella Lose, MD       DISCUSSION: Patient is an 81 year old male scheduled for the above procedure.  History includes former smoker (quit 03/25/98), HTN, HLD, CAD ("PTCA of the LAD July 1992, PTCA of the RCA February 1986"), afib (diagnosed ~ 2020), CVA (CVA 02/16/20 in setting of subtherapeutic INR due to warfarin being on hold for surgery; switched to Eliquis), DVT (1998; LLE DVT 01/05/01), GERD, BPH (with LUTS), diverticulitis (s/p right IHR and sigmoid colectomy with colostomy for perforation 08/07/08; s/p exploratory laparotomy for LOA, closure of colostomy, repair of parastomal ventral hernia 05/11/09; SBO due to partial intermittent closed loop obstruction in the LUQ, s/p exploratory laparotomy, LOA, small bowel resection, Wound VAC dressing 09/02/20), chronic venous insufficiency.  [facility] admission 08/28/20-09/18/20 for acute abdominal pain with CT concerning for SBO with question of closed-loop anatomy. General surgeon consulted. No peritonitis on exam, so medical management recommended initially; however, he made little progress and unable to take enteral nutrition, so ultimately taken to the OR on 09/02/20 exploratory laparotomy, LOA, small bowel resection. He had post-op ileus, elevated LFTs, and leukocytosis (WBC peak 18K) prompting CT abd/pelvis on 09/09/20 showing a suspected large anterior pelvis hematoma. S/p CT guided aspiration on 09/10/20 which was consistent with liquefied hematoma (on Zosyn until cultures back: no growth). LFTs and WBC improved. Diet advanced. Eliquis resumed for afib. He did require treatment with Rocephin for UTI. PT/OT recommended SNF for on-going rehab services. Discharged to [facility].  Last cardiology evaluation with Dr.  Bettina Gavia on 03/09/21. He wrote, " Overall he is doing well tolerates his anticoagulant without bleeding and understands management perioperatively.  Unfortunately had a stroke the last time anticoagulation was withdrawn he was with warfarin I think the window of opportunity is narrowed with a direct anticoagulant and I do not think bridging is an option with the nature of his surgery spine surgery.  He is aware of the risk Stable rate controlled atrial fibrillation he is not on suppressive treatment tells me he will stay overnight in the hospital I put him on a monitor to bed and do an EKG postoperative day 1 if any problems contact [place] [facility].." He advised anticoagulation can be held for 3 days prior to surgery. Last Eliquis 03/15/21.   03/15/2021 presurgical COVID-19 test negative.  Anesthesia team to evaluate on the day of surgery.   VS: BP (!) 149/96    Pulse 81    Temp 36.7 C (Oral)    Resp 18    Ht 6' (1.829 m)    Wt 94.1 kg    SpO2 97%    BMI 28.14 kg/m    PROVIDERS: Vivi Barrack, MD is PCP  - Shirlee More, MD is cardiologist. Last visit 03/09/21. Antony Contras, MD is neurologist. Last visit 08/27/20 by Frann Rider, NP. Alona Bene, MD is urologist ([facility]). TURP was planned in 02/2020, but was cancelled after he had a CVA while warfarin was on hold. He has since had to intermittent self cath. TURP was rescheduled for 02/18/21, but patient cancelled. Donnie Mesa, MD is general surgeon   LABS: Labs reviewed: Acceptable for surgery. (all labs ordered are listed, but only abnormal results are displayed)  Labs Reviewed  CBC - Abnormal; Notable for the following components:      Result Value   RDW 19.5 (*)    All other components within normal limits  SARS CORONAVIRUS 2 (TAT 6-24 HRS)  SURGICAL PCR SCREEN  BASIC METABOLIC PANEL     IMAGES: MRI L-spine 12/20/20: IMPRESSION: 1. Generalized spinal degeneration with scoliosis and L4-5 anterolisthesis. 2. Foraminal  impingement on the left at L2-3, L3-4 and right L3-4, L4-5. 3. Spinal stenosis that is advanced at L3-4 and mild to moderate at L4-5. 4. Incidental findings of chronic bladder outlet obstruction.   CXR 10/16/20:  FINDINGS: Right central line has been removed. Enteric tube has been removed. Normal heart size. Unchanged mediastinal contours with aortic atherosclerosis. There is no pneumothorax or large pleural effusion. No pulmonary edema. No confluent airspace disease. No acute osseous abnormalities are seen. Thoracic spondylosis. IMPRESSION: No acute chest findings or explanation for shortness of breath.    EKG: 08/07/21: Afib at 77 bpm   CV: [place] Abd Aorta 03/05/20: Summary:  Abdominal Aorta: The largest aortic measurement is 2.5 cm, based on  limited visualization due to overlying bowel gas.    Echo with Bubble Study 02/17/20: IMPRESSIONS   1. Left ventricular ejection fraction, by estimation, is 60 to 65%. The  left ventricle has normal function. The left ventricle has no regional  wall motion abnormalities. Left ventricular diastolic parameters are  consistent with Grade I diastolic  dysfunction (impaired relaxation).   2. Right ventricular systolic function is normal. The right ventricular  size is normal. There is normal pulmonary artery systolic pressure.   3. Left atrial size was severely dilated.   4. The mitral valve is normal in structure. Trivial mitral valve  regurgitation. No evidence of mitral stenosis.   5. The aortic valve is normal in structure. There is mild calcification  of the aortic valve. There is mild thickening of the aortic valve. Aortic  valve regurgitation is mild. No aortic stenosis is present.   6. The inferior vena cava is normal in size with greater than 50%  respiratory variability, suggesting right atrial pressure of 3 mmHg.   7. Agitated saline contrast bubble study was negative, with no evidence  of any interatrial shunt.    7 Day ZioPatch  monitor 04/12/18: - A 7-day ZIO monitor was performed beginning 04/12/2018 to assess atrial fibrillation.   - The rhythm throughout is atrial fibrillation with minimum average and maximum heart rates of 4976 and 156 bpm. - There were no pauses of 3 seconds or greater. - Ventricular ectopy was rare with single PVCs couplets and 1 4 beat run of PVCs - There were no symptomatic events. - Conclusion atrial fibrillation controlled rate.   Per 11/11/16 cardiology notes by Jacolyn Reedy, MD, cardiac cath on 08/13/90: "Cardiac Cath Results:  normal Left main, 80% stenosis proximal LAD, no significant disease CFX, 40% stenosis proximal RCA, widely patent RCA at the previous angioplasty site, PTCA of LAD done after unsuccessful atherectomy;  LVEF of 66% documented via nuclear study on 02/19/2009"   Past Medical History:  Diagnosis Date   BPH (benign prostatic hypertrophy)    BPH associated with nocturia 08/29/2013   BPH with obstruction/lower urinary tract symptoms 08/14/2019   CAD (coronary artery disease)    CAD (coronary artery disease), native coronary artery    PTCA of RCA 1986, PTCA of LAD 1992,  Negative treadmill Cardiolite 2011    Chronic dermatitis of hands 10/12/2015   Chronic venous insufficiency  Coagulation disorder ([place]) 10/26/2018   DVT (deep venous thrombosis) ([place])    Dyslipidemia    Dysrhythmia    Eczema    Erectile dysfunction 07/11/2012   Essential hypertension    GERD 08/04/2006       GERD (gastroesophageal reflux disease)    Hyperglycemia 07/08/2019   Hyperlipidemia    Hypertension    Incomplete emptying of bladder 08/14/2019   Long term current use of anticoagulant therapy    Lumbar disc disease    Overweight (BMI 25.0-29.9) 06/18/2015   Persistent atrial fibrillation ([place]) 04/10/2018   Personal history of DVT (deep vein thrombosis)    Initially in 1998 with recurrence in 2002 now on chronic warfarin    Soft tissue lesion of foot 08/27/2015   Stroke [facility])     Urinary tract infection with hematuria 08/14/2019    Past Surgical History:  Procedure Laterality Date   APPLICATION OF WOUND VAC  09/02/2020   Procedure: APPLICATION OF WOUND VAC;  Surgeon: Donnie Mesa, MD;  Location: [place];  Service: General;;   CARDIAC CATHETERIZATION     CARPAL TUNNEL RELEASE  04/21/2011   Procedure: CARPAL TUNNEL RELEASE;  Surgeon: Tennis Must, MD;  Location: [place];  Service: Orthopedics;  Laterality: Left;   CARPAL TUNNEL RELEASE  05/23/2011   Procedure: CARPAL TUNNEL RELEASE;  Surgeon: Tennis Must, MD;  Location: [place];  Service: Orthopedics;  Laterality: Right;   COLON SURGERY  2010 and 2011 colostomy reversal   CORONARY ANGIOPLASTY     EYE SURGERY     repair of macular hole   LAPAROTOMY N/A 09/02/2020   Procedure: EXPLORATORY LAPAROTOMY, LYSIS OF ADHESIONS ,BOWEL RESECTION;  Surgeon: Donnie Mesa, MD;  Location: [place];  Service: General;  Laterality: N/A;   NASAL SEPTUM SURGERY     precutaneous transluminal coronary angioplasty     right hernia     TONSILLECTOMY AND ADENOIDECTOMY      MEDICATIONS:  acetaminophen (ACETAMINOPHEN 8 HOUR) 650 MG CR tablet   apixaban (ELIQUIS) 5 MG TABS tablet   atenolol (TENORMIN) 25 MG tablet   atorvastatin (LIPITOR) 40 MG tablet   Coenzyme Q10 (CO Q-10) 300 MG CAPS   Coenzyme Q10 (COQ10) 200 MG CAPS   Cyanocobalamin (VITAMIN B-12) 1000 MCG SUBL   furosemide (LASIX) 20 MG tablet   hydroxypropyl methylcellulose / hypromellose (ISOPTO TEARS / GONIOVISC) 2.5 % ophthalmic solution   nitroGLYCERIN (NITROSTAT) 0.4 MG SL tablet   oxymetazoline (AFRIN) 0.05 % nasal spray   No current facility-administered medications for this encounter.    Myra Gianotti, PA-C Surgical Short Stay/Anesthesiology [facility] Phone [phone] [facility] Phone [phone] 03/16/2021 2:31 PM

## 2021-03-18 ENCOUNTER — Encounter (HOSPITAL_COMMUNITY): Payer: Self-pay | Admitting: Neurosurgery

## 2021-03-18 ENCOUNTER — Ambulatory Visit (HOSPITAL_COMMUNITY): Payer: Medicare HMO | Admitting: Vascular Surgery

## 2021-03-18 ENCOUNTER — Ambulatory Visit (HOSPITAL_COMMUNITY): Payer: Medicare HMO | Admitting: Critical Care Medicine

## 2021-03-18 ENCOUNTER — Other Ambulatory Visit: Payer: Self-pay

## 2021-03-18 ENCOUNTER — Encounter (HOSPITAL_COMMUNITY)
Admission: RE | Disposition: A | Discharge status: Home / Self Care | Payer: Self-pay | Source: Home / Self Care | Attending: Neurosurgery

## 2021-03-18 ENCOUNTER — Ambulatory Visit (HOSPITAL_COMMUNITY): Payer: Medicare HMO

## 2021-03-18 ENCOUNTER — Observation Stay (HOSPITAL_COMMUNITY)
Admission: RE | Admit: 2021-03-18 | Discharge: 2021-03-18 | Disposition: A | Discharge status: Home / Self Care | Payer: Medicare HMO | Attending: Neurosurgery | Admitting: Neurosurgery

## 2021-03-18 DIAGNOSIS — M48061 Spinal stenosis, lumbar region without neurogenic claudication: Secondary | ICD-10-CM | POA: Diagnosis not present

## 2021-03-18 DIAGNOSIS — I10 Essential (primary) hypertension: Secondary | ICD-10-CM | POA: Diagnosis not present

## 2021-03-18 DIAGNOSIS — E785 Hyperlipidemia, unspecified: Secondary | ICD-10-CM | POA: Diagnosis not present

## 2021-03-18 DIAGNOSIS — Z79899 Other long term (current) drug therapy: Secondary | ICD-10-CM | POA: Diagnosis not present

## 2021-03-18 DIAGNOSIS — Z419 Encounter for procedure for purposes other than remedying health state, unspecified: Secondary | ICD-10-CM

## 2021-03-18 DIAGNOSIS — M5416 Radiculopathy, lumbar region: Secondary | ICD-10-CM | POA: Diagnosis not present

## 2021-03-18 DIAGNOSIS — Z86718 Personal history of other venous thrombosis and embolism: Secondary | ICD-10-CM | POA: Insufficient documentation

## 2021-03-18 DIAGNOSIS — I4819 Other persistent atrial fibrillation: Secondary | ICD-10-CM | POA: Insufficient documentation

## 2021-03-18 DIAGNOSIS — I2510 Atherosclerotic heart disease of native coronary artery without angina pectoris: Secondary | ICD-10-CM | POA: Diagnosis not present

## 2021-03-18 DIAGNOSIS — M21372 Foot drop, left foot: Secondary | ICD-10-CM | POA: Diagnosis not present

## 2021-03-18 DIAGNOSIS — Z981 Arthrodesis status: Secondary | ICD-10-CM | POA: Diagnosis not present

## 2021-03-18 DIAGNOSIS — Z87891 Personal history of nicotine dependence: Secondary | ICD-10-CM | POA: Insufficient documentation

## 2021-03-18 DIAGNOSIS — Z7901 Long term (current) use of anticoagulants: Secondary | ICD-10-CM | POA: Diagnosis not present

## 2021-03-18 HISTORY — PX: LUMBAR LAMINECTOMY/DECOMPRESSION MICRODISCECTOMY: SHX5026

## 2021-03-18 SURGERY — LUMBAR LAMINECTOMY/DECOMPRESSION MICRODISCECTOMY 1 LEVEL
Anesthesia: General | Laterality: Left

## 2021-03-18 MED ORDER — ACETAMINOPHEN 325 MG PO TABS: 650.0000 mg | ORAL_TABLET | ORAL | Status: DC | PRN

## 2021-03-18 MED ORDER — DEXAMETHASONE SODIUM PHOSPHATE 10 MG/ML IJ SOLN
INTRAMUSCULAR | Status: AC
  Filled 2021-03-18: qty 2

## 2021-03-18 MED ORDER — DOCUSATE SODIUM 100 MG PO CAPS: 100.0000 mg | ORAL_CAPSULE | Freq: Two times a day (BID) | ORAL | Status: DC

## 2021-03-18 MED ORDER — OXYCODONE HCL 5 MG/5ML PO SOLN: 5.0000 mg | Freq: Once | ORAL | Status: DC | PRN

## 2021-03-18 MED ORDER — ACETAMINOPHEN 500 MG PO TABS: 1000.0000 mg | ORAL_TABLET | Freq: Once | ORAL | Status: DC | PRN

## 2021-03-18 MED ORDER — METHOCARBAMOL 500 MG PO TABS: 500.0000 mg | ORAL_TABLET | Freq: Four times a day (QID) | ORAL | 0 refills | Status: DC | PRN

## 2021-03-18 MED ORDER — CEFAZOLIN SODIUM-DEXTROSE 2-4 GM/100ML-% IV SOLN
2.0000 g | INTRAVENOUS | Status: AC
  Administered 2021-03-18: 2 g via INTRAVENOUS

## 2021-03-18 MED ORDER — FUROSEMIDE 20 MG PO TABS: 20.0000 mg | ORAL_TABLET | Freq: Every day | ORAL | Status: DC

## 2021-03-18 MED ORDER — SODIUM CHLORIDE 0.9 % IV SOLN: 250.0000 mL | INTRAVENOUS | Status: DC

## 2021-03-18 MED ORDER — BISACODYL 10 MG RE SUPP: 10.0000 mg | Freq: Every day | RECTAL | Status: DC | PRN

## 2021-03-18 MED ORDER — NITROGLYCERIN 0.4 MG SL SUBL: 0.4000 mg | SUBLINGUAL_TABLET | SUBLINGUAL | Status: DC | PRN

## 2021-03-18 MED ORDER — MORPHINE SULFATE (PF) 2 MG/ML IV SOLN: 2.0000 mg | INTRAVENOUS | Status: DC | PRN

## 2021-03-18 MED ORDER — ONDANSETRON HCL 4 MG/2ML IJ SOLN
INTRAMUSCULAR | Status: DC | PRN
  Administered 2021-03-18: 4 mg via INTRAVENOUS

## 2021-03-18 MED ORDER — FENTANYL CITRATE (PF) 250 MCG/5ML IJ SOLN
INTRAMUSCULAR | Status: DC | PRN
  Administered 2021-03-18 (×3): 50 ug via INTRAVENOUS

## 2021-03-18 MED ORDER — MIDAZOLAM HCL 2 MG/2ML IJ SOLN
INTRAMUSCULAR | Status: AC
  Filled 2021-03-18: qty 2

## 2021-03-18 MED ORDER — LIDOCAINE-EPINEPHRINE 1 %-1:100000 IJ SOLN
INTRAMUSCULAR | Status: DC | PRN
  Administered 2021-03-18: 5 mL

## 2021-03-18 MED ORDER — SENNA 8.6 MG PO TABS: 1.0000 | ORAL_TABLET | Freq: Two times a day (BID) | ORAL | Status: DC

## 2021-03-18 MED ORDER — CEFAZOLIN SODIUM-DEXTROSE 2-4 GM/100ML-% IV SOLN
INTRAVENOUS | Status: AC
  Filled 2021-03-18: qty 100

## 2021-03-18 MED ORDER — ATORVASTATIN CALCIUM 10 MG PO TABS: 20.0000 mg | ORAL_TABLET | Freq: Every day | ORAL | Status: DC

## 2021-03-18 MED ORDER — ORAL CARE MOUTH RINSE: 15.0000 mL | Freq: Once | OROMUCOSAL | Status: AC

## 2021-03-18 MED ORDER — OXYCODONE HCL 5 MG PO TABS: 5.0000 mg | ORAL_TABLET | ORAL | 0 refills | Status: DC | PRN

## 2021-03-18 MED ORDER — CHLORHEXIDINE GLUCONATE CLOTH 2 % EX PADS: 6.0000 | MEDICATED_PAD | Freq: Once | CUTANEOUS | Status: DC

## 2021-03-18 MED ORDER — CHLORHEXIDINE GLUCONATE 0.12 % MT SOLN: 15.0000 mL | Freq: Once | OROMUCOSAL | Status: AC

## 2021-03-18 MED ORDER — SUGAMMADEX SODIUM 200 MG/2ML IV SOLN
INTRAVENOUS | Status: DC | PRN
  Administered 2021-03-18: 200 mg via INTRAVENOUS

## 2021-03-18 MED ORDER — CEFAZOLIN SODIUM-DEXTROSE 2-4 GM/100ML-% IV SOLN: 2.0000 g | Freq: Three times a day (TID) | INTRAVENOUS | Status: DC

## 2021-03-18 MED ORDER — ROCURONIUM BROMIDE 10 MG/ML (PF) SYRINGE
PREFILLED_SYRINGE | INTRAVENOUS | Status: AC
  Filled 2021-03-18: qty 20

## 2021-03-18 MED ORDER — METHOCARBAMOL 1000 MG/10ML IJ SOLN
500.0000 mg | Freq: Four times a day (QID) | INTRAVENOUS | Status: DC | PRN
  Filled 2021-03-18: qty 5

## 2021-03-18 MED ORDER — METHYLPREDNISOLONE ACETATE 80 MG/ML IJ SUSP
INTRAMUSCULAR | Status: AC
  Filled 2021-03-18: qty 1

## 2021-03-18 MED ORDER — SODIUM CHLORIDE 0.9 % IV SOLN: INTRAVENOUS | Status: DC

## 2021-03-18 MED ORDER — ACETAMINOPHEN 650 MG RE SUPP: 650.0000 mg | RECTAL | Status: DC | PRN

## 2021-03-18 MED ORDER — PROPOFOL 10 MG/ML IV BOLUS
INTRAVENOUS | Status: DC | PRN
  Administered 2021-03-18 (×3): 10 mg via INTRAVENOUS
  Administered 2021-03-18: 50 mg via INTRAVENOUS
  Administered 2021-03-18: 100 mg via INTRAVENOUS
  Administered 2021-03-18 (×2): 10 mg via INTRAVENOUS

## 2021-03-18 MED ORDER — BUPIVACAINE HCL (PF) 0.5 % IJ SOLN
INTRAMUSCULAR | Status: AC
  Filled 2021-03-18: qty 30

## 2021-03-18 MED ORDER — OXYCODONE HCL 5 MG PO TABS: 10.0000 mg | ORAL_TABLET | ORAL | Status: DC | PRN

## 2021-03-18 MED ORDER — ATENOLOL 25 MG PO TABS: 25.0000 mg | ORAL_TABLET | Freq: Every day | ORAL | Status: DC

## 2021-03-18 MED ORDER — PHENYLEPHRINE 40 MCG/ML (10ML) SYRINGE FOR IV PUSH (FOR BLOOD PRESSURE SUPPORT)
PREFILLED_SYRINGE | INTRAVENOUS | Status: AC
  Filled 2021-03-18: qty 30

## 2021-03-18 MED ORDER — LACTATED RINGERS IV SOLN: INTRAVENOUS | Status: DC

## 2021-03-18 MED ORDER — PANTOPRAZOLE SODIUM 40 MG IV SOLR: 40.0000 mg | Freq: Every day | INTRAVENOUS | Status: DC

## 2021-03-18 MED ORDER — OXYCODONE HCL 5 MG PO TABS: 5.0000 mg | ORAL_TABLET | ORAL | Status: DC | PRN

## 2021-03-18 MED ORDER — PHENYLEPHRINE HCL-NACL 20-0.9 MG/250ML-% IV SOLN
INTRAVENOUS | Status: DC | PRN
  Administered 2021-03-18: 25 ug/min via INTRAVENOUS

## 2021-03-18 MED ORDER — SODIUM CHLORIDE 0.9% FLUSH: 3.0000 mL | Freq: Two times a day (BID) | INTRAVENOUS | Status: DC

## 2021-03-18 MED ORDER — FENTANYL CITRATE (PF) 250 MCG/5ML IJ SOLN
INTRAMUSCULAR | Status: AC
  Filled 2021-03-18: qty 5

## 2021-03-18 MED ORDER — MENTHOL 3 MG MT LOZG: 1.0000 | LOZENGE | OROMUCOSAL | Status: DC | PRN

## 2021-03-18 MED ORDER — 0.9 % SODIUM CHLORIDE (POUR BTL) OPTIME
TOPICAL | Status: DC | PRN
  Administered 2021-03-18: 1000 mL

## 2021-03-18 MED ORDER — THROMBIN 5000 UNITS EX SOLR
CUTANEOUS | Status: AC
  Filled 2021-03-18: qty 5000

## 2021-03-18 MED ORDER — CHLORHEXIDINE GLUCONATE 0.12 % MT SOLN
OROMUCOSAL | Status: AC
  Administered 2021-03-18: 15 mL via OROMUCOSAL
  Filled 2021-03-18: qty 15

## 2021-03-18 MED ORDER — FENTANYL CITRATE (PF) 100 MCG/2ML IJ SOLN: 25.0000 ug | INTRAMUSCULAR | Status: DC | PRN

## 2021-03-18 MED ORDER — SODIUM CHLORIDE 0.9% FLUSH: 3.0000 mL | INTRAVENOUS | Status: DC | PRN

## 2021-03-18 MED ORDER — THROMBIN 5000 UNITS EX SOLR
CUTANEOUS | Status: DC | PRN
  Administered 2021-03-18: 5 mL via TOPICAL

## 2021-03-18 MED ORDER — BUPIVACAINE HCL (PF) 0.5 % IJ SOLN
INTRAMUSCULAR | Status: DC | PRN
  Administered 2021-03-18: 5 mL

## 2021-03-18 MED ORDER — ACETAMINOPHEN 160 MG/5ML PO SOLN: 1000.0000 mg | Freq: Once | ORAL | Status: DC | PRN

## 2021-03-18 MED ORDER — METHOCARBAMOL 500 MG PO TABS: 500.0000 mg | ORAL_TABLET | Freq: Four times a day (QID) | ORAL | Status: DC | PRN

## 2021-03-18 MED ORDER — ACETAMINOPHEN 10 MG/ML IV SOLN: 1000.0000 mg | Freq: Once | INTRAVENOUS | Status: DC | PRN

## 2021-03-18 MED ORDER — APIXABAN 5 MG PO TABS: 5.0000 mg | ORAL_TABLET | Freq: Two times a day (BID) | ORAL | 11 refills | Status: AC

## 2021-03-18 MED ORDER — METHYLPREDNISOLONE ACETATE 80 MG/ML IJ SUSP
INTRAMUSCULAR | Status: DC | PRN
  Administered 2021-03-18: 80 mg

## 2021-03-18 MED ORDER — ONDANSETRON HCL 4 MG/2ML IJ SOLN: 4.0000 mg | Freq: Four times a day (QID) | INTRAMUSCULAR | Status: DC | PRN

## 2021-03-18 MED ORDER — LIDOCAINE-EPINEPHRINE 1 %-1:100000 IJ SOLN
INTRAMUSCULAR | Status: AC
  Filled 2021-03-18: qty 1

## 2021-03-18 MED ORDER — ONDANSETRON HCL 4 MG PO TABS: 4.0000 mg | ORAL_TABLET | Freq: Four times a day (QID) | ORAL | Status: DC | PRN

## 2021-03-18 MED ORDER — DEXAMETHASONE SODIUM PHOSPHATE 10 MG/ML IJ SOLN
INTRAMUSCULAR | Status: DC | PRN
Start: 2021-03-18 — End: 2021-03-18
  Administered 2021-03-18: 4 mg via INTRAVENOUS

## 2021-03-18 MED ORDER — ROCURONIUM BROMIDE 10 MG/ML (PF) SYRINGE
PREFILLED_SYRINGE | INTRAVENOUS | Status: DC | PRN
  Administered 2021-03-18: 50 mg via INTRAVENOUS

## 2021-03-18 MED ORDER — OXYCODONE HCL 5 MG PO TABS: 5.0000 mg | ORAL_TABLET | Freq: Once | ORAL | Status: DC | PRN

## 2021-03-18 MED ORDER — PHENOL 1.4 % MT LIQD: 1.0000 | OROMUCOSAL | Status: DC | PRN

## 2021-03-18 MED ORDER — ONDANSETRON HCL 4 MG/2ML IJ SOLN
INTRAMUSCULAR | Status: AC
  Filled 2021-03-18: qty 4

## 2021-03-18 SURGICAL SUPPLY — 54 items
BAG COUNTER SPONGE SURGICOUNT (BAG) ×3 IMPLANT
BAND RUBBER #18 3X1/16 STRL (MISCELLANEOUS) ×4 IMPLANT
BENZOIN TINCTURE PRP APPL 2/3 (GAUZE/BANDAGES/DRESSINGS) IMPLANT
BLADE CLIPPER SURG (BLADE) IMPLANT
BLADE SURG 11 STRL SS (BLADE) ×2 IMPLANT
BUR MATCHSTICK NEURO 3.0 LAGG (BURR) ×2 IMPLANT
BUR PRECISION FLUTE 5.0 (BURR) IMPLANT
CANISTER SUCT 3000ML PPV (MISCELLANEOUS) ×2 IMPLANT
CARTRIDGE OIL MAESTRO DRILL (MISCELLANEOUS) ×1 IMPLANT
DECANTER SPIKE VIAL GLASS SM (MISCELLANEOUS) ×1 IMPLANT
DERMABOND ADVANCED (GAUZE/BANDAGES/DRESSINGS) ×1
DERMABOND ADVANCED .7 DNX12 (GAUZE/BANDAGES/DRESSINGS) ×1 IMPLANT
DIFFUSER DRILL AIR PNEUMATIC (MISCELLANEOUS) ×2 IMPLANT
DRAPE LAPAROTOMY 100X72X124 (DRAPES) ×2 IMPLANT
DRAPE MICROSCOPE LEICA (MISCELLANEOUS) ×2 IMPLANT
DRAPE SURG 17X23 STRL (DRAPES) ×2 IMPLANT
DRSG OPSITE POSTOP 3X4 (GAUZE/BANDAGES/DRESSINGS) ×2 IMPLANT
DURAPREP 26ML APPLICATOR (WOUND CARE) ×2 IMPLANT
ELECT REM PT RETURN 9FT ADLT (ELECTROSURGICAL) ×2
ELECTRODE REM PT RTRN 9FT ADLT (ELECTROSURGICAL) ×1 IMPLANT
GAUZE 4X4 16PLY ~~LOC~~+RFID DBL (SPONGE) ×1 IMPLANT
GAUZE SPONGE 4X4 12PLY STRL (GAUZE/BANDAGES/DRESSINGS) IMPLANT
GLOVE EXAM NITRILE XL STR (GLOVE) IMPLANT
GLOVE SURG ENC MOIS LTX SZ7.5 (GLOVE) IMPLANT
GLOVE SURG LTX SZ7 (GLOVE) ×2 IMPLANT
GLOVE SURG UNDER POLY LF SZ7.5 (GLOVE) ×4 IMPLANT
GOWN STRL REUS W/ TWL LRG LVL3 (GOWN DISPOSABLE) ×2 IMPLANT
GOWN STRL REUS W/ TWL XL LVL3 (GOWN DISPOSABLE) IMPLANT
GOWN STRL REUS W/TWL 2XL LVL3 (GOWN DISPOSABLE) IMPLANT
GOWN STRL REUS W/TWL LRG LVL3 (GOWN DISPOSABLE) ×4
GOWN STRL REUS W/TWL XL LVL3 (GOWN DISPOSABLE)
HEMOSTAT POWDER KIT SURGIFOAM (HEMOSTASIS) ×2 IMPLANT
KIT BASIN OR (CUSTOM PROCEDURE TRAY) ×2 IMPLANT
KIT TURNOVER KIT B (KITS) ×2 IMPLANT
NDL HYPO 18GX1.5 BLUNT FILL (NEEDLE) IMPLANT
NDL SPNL 18GX3.5 QUINCKE PK (NEEDLE) IMPLANT
NEEDLE HYPO 18GX1.5 BLUNT FILL (NEEDLE) IMPLANT
NEEDLE HYPO 22GX1.5 SAFETY (NEEDLE) ×2 IMPLANT
NEEDLE SPNL 18GX3.5 QUINCKE PK (NEEDLE) IMPLANT
NS IRRIG 1000ML POUR BTL (IV SOLUTION) ×2 IMPLANT
OIL CARTRIDGE MAESTRO DRILL (MISCELLANEOUS) ×2
PACK LAMINECTOMY NEURO (CUSTOM PROCEDURE TRAY) ×2 IMPLANT
PAD ARMBOARD 7.5X6 YLW CONV (MISCELLANEOUS) ×6 IMPLANT
SPONGE SURGIFOAM ABS GEL SZ50 (HEMOSTASIS) ×1 IMPLANT
SPONGE T-LAP 4X18 ~~LOC~~+RFID (SPONGE) ×1 IMPLANT
STRIP CLOSURE SKIN 1/2X4 (GAUZE/BANDAGES/DRESSINGS) IMPLANT
SUT VIC AB 0 CT1 18XCR BRD8 (SUTURE) ×1 IMPLANT
SUT VIC AB 0 CT1 8-18 (SUTURE) ×2
SUT VIC AB 2-0 CT1 18 (SUTURE) IMPLANT
SUT VICRYL 3-0 RB1 18 ABS (SUTURE) ×4 IMPLANT
SYR 3ML LL SCALE MARK (SYRINGE) IMPLANT
TOWEL GREEN STERILE (TOWEL DISPOSABLE) ×2 IMPLANT
TOWEL GREEN STERILE FF (TOWEL DISPOSABLE) ×2 IMPLANT
WATER STERILE IRR 1000ML POUR (IV SOLUTION) ×2 IMPLANT

## 2021-03-18 NOTE — H&P (Signed)
Please see progress note dated 03/18/21 12:35p   Consuella Lose, MD [facility]

## 2021-03-18 NOTE — Plan of Care (Signed)

## 2021-03-18 NOTE — Op Note (Signed)
NEUROSURGERY OPERATIVE NOTE   PREOP DIAGNOSIS: Lumbar radiculopathy, left L3-4  POSTOP DIAGNOSIS: Same  PROCEDURE: 1. Left L3-4 laminotomy and foraminotomy for decompression of nerve root 2. Use of operating microscope  SURGEON: Dr. Consuella Lose, MD  ASSISTANT: None  ANESTHESIA: General Endotracheal  EBL: 75cc  SPECIMENS: None  DRAINS: None  COMPLICATIONS: None immediate  CONDITION: Hemodynamically stable to PACU  HISTORY: William Norman is a 81 y.o. male initially seen in the outpatient neurosurgery clinic with left-sided leg pain and a left foot drop.  While his leg pain significantly improved with epidural steroid injection, he did not have any improvement in his left foot drop.  His imaging did reveal severe left-sided lateral recess stenosis at L3-4 likely compressing the left L4 nerve root.  After lengthy discussion, he did elect to proceed with surgical decompression in an attempt to improve his foot drop.  The risks, benefits, and alternatives to surgery as well as the expected postoperative course and recovery were all reviewed in detail with the patient.  After all his questions were answered informed consent was obtained and witnessed.  PROCEDURE IN DETAIL: The patient was brought to the operating room by stretcher. After induction of general anesthesia, the patient was positioned on the operative table in the prone position with all pressure points meticulously padded. The skin of the low back was then prepped and draped in the usual sterile fashion.  After timeout was conducted, a spinal needle was introduced and lateral x-ray was taken to identify the surface projection of the L3-4 interspace.  The skin was infiltrated with local anesthetic. Skin incision was then made sharply and Bovie electrocautery was used to dissect the subcutaneous tissue until the lumbodorsal fascia was identified. The fascia was then incised using Bovie electrocautery and the lamina  at the L3 and L4 levels was identified and dissection was carried out in the subperiosteal plane. Self-retaining retractor was then placed, and intraoperative x-ray was taken with a dissector in the L3-4 interspace to confirm we were at the correct level.  Using a high-speed drill, laminotomy was completed with a partial medial facetectomy. The ligamentum flavum was then identified and removed.  The L3-4 facet complex was noted to be somewhat hypertrophic with a significant amount of thickening of the ligamentum flavum.  Using a combination of high-speed drill and Kerrison punches, I did remove the medial aspect of the L3-4 facet complex in order to identify the lateral edge of the thecal sac.  Kerrison punches were used to undercut the facet and remove the medial aspect of the superior articulating process of L4 on the left.  This then allowed visualization of the traversing left L4 nerve root.  Once this was completed I was able to easily pass a small ball-tipped dissector along the L4 nerve root as it coursed medial to the L4 pedicle.  I was also able to easily pass the dissector within the ventral epidural space and I therefore did not feel the need to perform a discectomy.  Hemostasis was then secured using a combination of morcellized Gelfoam and thrombin and bipolar electrocautery. The wound was irrigated with copious amounts of saline irrigation. The nerve root was then covered with a long-acting steroid solution. Self-retaining retractor was then removed, and the wound is closed in layers using a combination of interrupted 0 Vicryl and 3-0 Vicryl stitches. The skin was closed using standard skin glue.  At the end of the case all sponge, needle, and instrument counts were correct. The  patient was then transferred to the stretcher and taken to the postanesthesia care unit in stable hemodynamic condition.   Consuella Lose, MD [facility]

## 2021-03-18 NOTE — H&P (Signed)
Chief Complaint  Left foot numbness/foot drop  History of Present Illness  William Norman is a 81 y.o. male initially seen in the outpatient neurosurgery clinic complaining of more than 1 month of left-sided leg pain and foot weakness.  He was initially seen by his primary doctor and has undergone a series of epidural steroid injections which have significantly improved his left leg pain.  Unfortunately he has had continued left-sided foot drop and left leg and foot numbness.  Patient does have a significant cardiac history with previous stent angioplasty and atrial fibrillation.  He is maintained on Eliquis and tells me that he had a stroke when he was previously on Coumadin which was held for a prior surgery.  He has been off his Eliquis for the last 3 days.  Past Medical History   Past Medical History:  Diagnosis Date   BPH (benign prostatic hypertrophy)    BPH associated with nocturia 08/29/2013   BPH with obstruction/lower urinary tract symptoms 08/14/2019   CAD (coronary artery disease)    CAD (coronary artery disease), native coronary artery    PTCA of RCA 1986, PTCA of LAD 1992,  Negative treadmill Cardiolite 2011    Chronic dermatitis of hands 10/12/2015   Chronic venous insufficiency    Coagulation disorder ([place]) 10/26/2018   DVT (deep venous thrombosis) ([place])    Dyslipidemia    Dysrhythmia    Eczema    Erectile dysfunction 07/11/2012   Essential hypertension    GERD 08/04/2006       GERD (gastroesophageal reflux disease)    Hyperglycemia 07/08/2019   Hyperlipidemia    Hypertension    Incomplete emptying of bladder 08/14/2019   Long term current use of anticoagulant therapy    Lumbar disc disease    Overweight (BMI 25.0-29.9) 06/18/2015   Persistent atrial fibrillation ([place]) 04/10/2018   Personal history of DVT (deep vein thrombosis)    Initially in 1998 with recurrence in 2002 now on chronic warfarin    Soft tissue lesion of foot 08/27/2015   Stroke [facility])     Urinary tract infection with hematuria 08/14/2019    Past Surgical History   Past Surgical History:  Procedure Laterality Date   APPLICATION OF WOUND VAC  09/02/2020   Procedure: APPLICATION OF WOUND VAC;  Surgeon: Donnie Mesa, MD;  Location: [place];  Service: General;;   CARDIAC CATHETERIZATION     CARPAL TUNNEL RELEASE  04/21/2011   Procedure: CARPAL TUNNEL RELEASE;  Surgeon: Tennis Must, MD;  Location: [place];  Service: Orthopedics;  Laterality: Left;   CARPAL TUNNEL RELEASE  05/23/2011   Procedure: CARPAL TUNNEL RELEASE;  Surgeon: Tennis Must, MD;  Location: [place];  Service: Orthopedics;  Laterality: Right;   COLON SURGERY  2010 and 2011 colostomy reversal   CORONARY ANGIOPLASTY     EYE SURGERY     repair of macular hole   LAPAROTOMY N/A 09/02/2020   Procedure: EXPLORATORY LAPAROTOMY, LYSIS OF ADHESIONS ,BOWEL RESECTION;  Surgeon: Donnie Mesa, MD;  Location: [place];  Service: General;  Laterality: N/A;   NASAL SEPTUM SURGERY     precutaneous transluminal coronary angioplasty     right hernia     TONSILLECTOMY AND ADENOIDECTOMY      Social History   Social History   Tobacco Use   Smoking status: Former    Packs/day: 1.00    Years: 60.00    Pack years: 60.00    Types: Cigarettes  Quit date: 03/25/1998    Years since quitting: 22.9   Smokeless tobacco: Never  Vaping Use   Vaping Use: Never used  Substance Use Topics   Alcohol use: Not Currently    Comment: once a month   Drug use: No    Medications   Prior to Admission medications   Medication Sig Start Date End Date Taking? Authorizing Provider  acetaminophen (ACETAMINOPHEN 8 HOUR) 650 MG CR tablet Take 1 tablet (650 mg total) by mouth every 8 (eight) hours as needed for pain. 11/20/20  Yes Vivi Barrack, MD  atenolol (TENORMIN) 25 MG tablet Take 1 tablet (25 mg total) by mouth daily. 12/31/20  Yes Vivi Barrack, MD  atorvastatin (LIPITOR) 40 MG tablet TAKE 1/2  TABLET BY MOUTH DAILY 07/03/20  Yes Vivi Barrack, MD  Coenzyme Q10 (CO Q-10) 300 MG CAPS Take 300 mg by mouth daily.   Yes [provider]  Coenzyme Q10 (COQ10) 200 MG CAPS Take 1 capsule by mouth daily. 07/23/20  Yes Vivi Barrack, MD  Cyanocobalamin (VITAMIN B-12) 1000 MCG SUBL Place 1 tablet (1,000 mcg total) under the tongue daily. 07/24/20  Yes Vivi Barrack, MD  furosemide (LASIX) 20 MG tablet Take 1 tablet (20 mg total) by mouth daily. 11/10/20 03/18/21 Yes Richardo Priest, MD  hydroxypropyl methylcellulose / hypromellose (ISOPTO TEARS / GONIOVISC) 2.5 % ophthalmic solution Place 1 drop into both eyes 2 (two) times daily as needed for dry eyes.   Yes [provider]  oxymetazoline (AFRIN) 0.05 % nasal spray Place 2 sprays into both nostrils at bedtime as needed for congestion.   Yes [provider]  apixaban (ELIQUIS) 5 MG TABS tablet Take 1 tablet (5 mg total) by mouth 2 (two) times daily. 03/15/21   Vivi Barrack, MD  nitroGLYCERIN (NITROSTAT) 0.4 MG SL tablet Dissolve 1 tablet under the tongue every 5 minutes as needed 03/09/21   Richardo Priest, MD    Allergies   Allergies  Allergen Reactions   Other Other (See Comments)    Some form of numbing medication used by dentist, almost died   Simvastatin Other (See Comments)    Muscle aches     Review of Systems  ROS  Neurologic Exam  Awake, alert, oriented Memory and concentration grossly intact Speech fluent, appropriate CN grossly intact Motor exam: Upper Extremities Deltoid Bicep Tricep Grip  Right 5/5 5/5 5/5 5/5  Left 5/5 5/5 5/5 5/5   Lower Extremities IP Quad PF DF EHL  Right 5/5 5/5 5/5 5/5 5/5  Left 5/5 5/5 5/5 0/5 0/5   Sensation grossly intact to LT  Imaging  MRI of the lumbar spine dated 12/20/2020 was personally reviewed.  This does demonstrate multilevel lumbar degenerative disease however most significant left-sided finding is that of severe lateral recess stenosis on the left at  L3-4.  There is mild left-sided lateral recess and foraminal stenosis at 4 5.  There is also significant left-sided L2-3 foraminal stenosis.  Impression  - 81 y.o. male with left-sided radiculopathy and a left foot drop.  While his pain symptoms are very well controlled with his epidural steroid injections, he continues to have left foot drop.  Suspect this is likely related to the lateral recess and foraminal stenosis on the left at L3-4.  Plan  -We will plan on proceeding with left-sided L3-4 laminotomy and foraminotomy  We had a lengthy discussion in the office regarding treatment options.  Given his significant medical  history and the fact that his foot drop persist despite improvement in his pain with the epidural steroid injection, we did discuss the somewhat lower likelihood that operative decompression will improve his foot drop.  Nonetheless, I do think that decompression does the least offer the best chance for improvement.  We did discuss the risks of the surgery especially in light of his medical history.  We also reviewed the expected postoperative course and recovery.  Patient has elected to proceed with surgical decompression.  All his questions today were answered.  He provided informed consent to proceed.  Consuella Lose, MD [facility]

## 2021-03-18 NOTE — Transfer of Care (Signed)
Immediate Anesthesia Transfer of Care Note  Patient: William Norman  Procedure(s) Performed: LAMINOTOMY, FORAMINOTOMY, LEFT LUMBAR THREE-FOUR (Left)  Patient Location: PACU  Anesthesia Type:General  Level of Consciousness: drowsy and patient cooperative  Airway & Oxygen Therapy: Patient Spontanous Breathing and Patient connected to nasal cannula oxygen  Post-op Assessment: Report given to RN and Post -op Vital signs reviewed and stable  Post vital signs: Reviewed and stable  Last Vitals:  Vitals Value Taken Time  BP 123/83 03/18/21 1453  Temp    Pulse 65 03/18/21 1455  Resp 16 03/18/21 1455  SpO2 96 % 03/18/21 1455  Vitals shown include unvalidated device data.  Last Pain:  Vitals:   03/18/21 1052  TempSrc:   PainSc: 0-No pain         Complications: No notable events documented.

## 2021-03-18 NOTE — Discharge Summary (Signed)
Physician Discharge Summary  Patient ID: Ambrose Wile MRN: [id number] DOB/AGE: 04/30/1940 81 y.o.  Admit date: 03/18/2021 Discharge date: 03/18/2021  Admission Diagnoses: Lumbar stenosis, L3-4  Discharge Diagnoses: Same Principal Problem:   Lumbar spinal stenosis   Discharged Condition: Stable  Hospital Course:  Mrs. Reily Ilic is a 81 y.o. male admitted for elective left L3-4 laminotomy and foraminotomy which was done without complication. The patient was at neurologic baseline postoperatively with unchanged left foot drop. Back pain was controlled with oral medication, he was ambulating without difficulty, voiding normally, and tolerating diet.  Treatments: Surgery - Left L3-4 laminotomy, foraminotomy  Discharge Exam: Blood pressure (!) 168/90, pulse 68, temperature 97.8 F (36.6 C), resp. rate 18, height 6' (1.829 m), weight 93.4 kg, SpO2 98 %. Awake, alert, oriented Speech fluent, appropriate CN grossly intact 5/5 BUE/BLEx left foot drop Wound c/d/i  Disposition: Discharge disposition: 01-Home or Self Care       Discharge Instructions     Call MD for:  redness, tenderness, or signs of infection (pain, swelling, redness, odor or green/yellow discharge around incision site)   Complete by: As directed    Call MD for:  temperature >100.4   Complete by: As directed    Diet - low sodium heart healthy   Complete by: As directed    Discharge instructions   Complete by: As directed    Walk at home as much as possible, at least 4 times / day   Increase activity slowly   Complete by: As directed    Lifting restrictions   Complete by: As directed    No lifting > 10 lbs   May shower / Bathe   Complete by: As directed    48 hours after surgery   May walk up steps   Complete by: As directed    Other Restrictions   Complete by: As directed    No bending/twisting at waist   Remove dressing in 48 hours   Complete by: As directed       Allergies  as of 03/18/2021       Reactions   Other Other (See Comments)   Some form of numbing medication used by dentist, almost died   Simvastatin Other (See Comments)   Muscle aches        Medication List     TAKE these medications    acetaminophen 650 MG CR tablet Commonly known as: Acetaminophen 8 Hour Take 1 tablet (650 mg total) by mouth every 8 (eight) hours as needed for pain.   apixaban 5 MG Tabs tablet Commonly known as: ELIQUIS Take 1 tablet (5 mg total) by mouth 2 (two) times daily. Start taking on: March 20, 2021 What changed: These instructions start on March 20, 2021. If you are unsure what to do until then, ask your doctor or other care provider.   atenolol 25 MG tablet Commonly known as: TENORMIN Take 1 tablet (25 mg total) by mouth daily.   atorvastatin 40 MG tablet Commonly known as: LIPITOR TAKE 1/2 TABLET BY MOUTH DAILY   Co Q-10 300 MG Caps Take 300 mg by mouth daily.   CoQ10 200 MG Caps Take 1 capsule by mouth daily.   furosemide 20 MG tablet Commonly known as: LASIX Take 1 tablet (20 mg total) by mouth daily.   hydroxypropyl methylcellulose / hypromellose 2.5 % ophthalmic solution Commonly known as: ISOPTO TEARS / GONIOVISC Place 1 drop into both eyes 2 (two) times daily as needed for  dry eyes.   methocarbamol 500 MG tablet Commonly known as: ROBAXIN Take 1 tablet (500 mg total) by mouth every 6 (six) hours as needed for muscle spasms.   nitroGLYCERIN 0.4 MG SL tablet Commonly known as: NITROSTAT Dissolve 1 tablet under the tongue every 5 minutes as needed   oxyCODONE 5 MG immediate release tablet Commonly known as: Oxy IR/ROXICODONE Take 1 tablet (5 mg total) by mouth every 3 (three) hours as needed for moderate pain ((score 4 to 6)).   oxymetazoline 0.05 % nasal spray Commonly known as: AFRIN Place 2 sprays into both nostrils at bedtime as needed for congestion.   Vitamin B-12 1000 MCG Subl Place 1 tablet (1,000 mcg total) under the  tongue daily.        Follow-up Information     Consuella Lose, MD Follow up in 3 week(s).   Specialty: Neurosurgery Contact information: [address] [place] [address] [phone]                 Signed: Jairo Ben 03/18/2021, 5:02 PM

## 2021-03-18 NOTE — Anesthesia Procedure Notes (Addendum)
Procedure Name: Intubation Date/Time: 03/18/2021 1:03 PM Performed by: Wilburn Cornelia, CRNA Pre-anesthesia Checklist: Patient identified, Emergency Drugs available, Suction available, Patient being monitored and Timeout performed Patient Re-evaluated:Patient Re-evaluated prior to induction Oxygen Delivery Method: Circle system utilized Preoxygenation: Pre-oxygenation with 100% oxygen Induction Type: IV induction Ventilation: Mask ventilation without difficulty and Oral airway inserted - appropriate to patient size Laryngoscope Size: Mac and 4 Grade View: Grade I Tube type: Oral Tube size: 7.5 mm Number of attempts: 2 Airway Equipment and Method: Stylet Placement Confirmation: ETT inserted through vocal cords under direct vision, positive ETCO2, CO2 detector and breath sounds checked- equal and bilateral Secured at: 23 cm Tube secured with: Tape Dental Injury: Teeth and Oropharynx as per pre-operative assessment  Comments: DLX1 by carelink RN, Valentino Nose - esophageal intubation.  DLx1 by H. Truman Hayward CRNA - grade 1 view, atraumatic intubation.

## 2021-03-18 NOTE — Progress Notes (Signed)
Patient awaiting transport via wheelchair by RN for discharge home; in no acute distress nor complaints of pain nor discomfort; incision on his back with honeycomb dressing and is clean, dry and intact; room was checked and accounted for all his belongings; discharge instructions concerning his medications, incision care, follow up appointment and when to call the doctor as needed were all discussed with patient and wife and they both expressed understanding on the instructions given.

## 2021-03-18 NOTE — Evaluation (Signed)
Physical Therapy Evaluation & Discharge Patient Details Name: William Norman MRN: [id number] DOB: 1940/04/15 Today's Date: 03/18/2021  History of Present Illness  81 y/o male admitted 03/18/18 following L L3-4 laminotomy and foraminotomy for decompression of nerve root. PMH: atrial fibrillation on Eliquis, CAD, history of CVA, hypertension, perforated bowel in 2010, colostomy reversal in 2011, and SBO in 2016 that resolved with conservative management  Clinical Impression  Patient admitted following procedure. Educated patient on back precautions and impact on daily mobility/tasks as well as progressive mobility program, patient verbalized understanding. Patient with L foot drop at baseline which has not improved since surgery. Patient at [place] for ambulation with no AD and able to negotiate stairs to access home. Patient with steppage gait pattern which is his current baseline. No further skilled PT needs identified acutely. No PT follow up recommended at this time but may benefit from OPPT once back precautions are lifted to address L foot drop and determine whether or not to brace. PT will sign off.        Recommendations for follow up therapy are one component of a multi-disciplinary discharge planning process, led by the attending physician.  Recommendations may be updated based on patient status, additional functional criteria and insurance authorization.  Follow Up Recommendations No PT follow up (may benefit from OPPT once back precautions are lifted to address L foot drop and to brace or not)    Assistance Recommended at Discharge Set up Supervision/Assistance  Patient can return home with the following  A little help with bathing/dressing/bathroom;Assistance with cooking/housework;Assist for transportation    Equipment Recommendations None recommended by PT  Recommendations for Other Services       Functional Status Assessment Patient has not had a recent decline in their  functional status     Precautions / Restrictions Precautions Precautions: Back Precaution Booklet Issued: Yes (comment) Required Braces or Orthoses:  (no brace needed) Restrictions Weight Bearing Restrictions: No      Mobility  Bed Mobility Overal bed mobility: Modified Independent                  Transfers Overall transfer level: Modified independent Equipment used: None                    Ambulation/Gait Ambulation/Gait assistance: Modified independent (Device/Increase time) Gait Distance (Feet): 250 Feet Assistive device: None Gait Pattern/deviations: Steppage, Decreased dorsiflexion - left Gait velocity: decreased     General Gait Details: steppage gait due to L foot drop which is his baseline  Stairs Stairs: Yes Stairs assistance: Modified independent (Device/Increase time) Stair Management: One rail Right, Step to pattern, Forwards Number of Stairs: 3    Wheelchair Mobility    Modified Rankin (Stroke Patients Only)       Balance Overall balance assessment: Mild deficits observed, not formally tested                                           Pertinent Vitals/Pain Pain Assessment Pain Assessment: No/denies pain    Home Living Family/patient expects to be discharged to:: Private residence Living Arrangements: Spouse/significant other;Children Available Help at Discharge: Family;Available PRN/intermittently Type of Home: House Home Access: Stairs to enter Entrance Stairs-Rails: None Entrance Stairs-Number of Steps: 2 Alternate Level Stairs-Number of Steps: flight Home Layout: Two level;Laundry or work area in basement;Able to live on main level with bedroom/bathroom Home  Equipment: Conservation officer, nature (2 wheels)      Prior Function Prior Level of Function : Independent/Modified Independent             Mobility Comments: reports 1 fall in past 6 months       Hand Dominance        Extremity/Trunk  Assessment   Upper Extremity Assessment Upper Extremity Assessment: Overall WFL for tasks assessed    Lower Extremity Assessment Lower Extremity Assessment: LLE deficits/detail LLE Deficits / Details: L foot drop (present before surgery with no improvement post surgery)    Cervical / Trunk Assessment Cervical / Trunk Assessment: Back Surgery  Communication   Communication: No difficulties  Cognition Arousal/Alertness: Awake/alert Behavior During Therapy: WFL for tasks assessed/performed Overall Cognitive Status: Within Functional Limits for tasks assessed                                          General Comments      Exercises     Assessment/Plan    PT Assessment Patient does not need any further PT services  PT Problem List         PT Treatment Interventions      PT Goals (Current goals can be found in the Care Plan section)  Acute Rehab PT Goals Patient Stated Goal: to go home today PT Goal Formulation: All assessment and education complete, DC therapy    Frequency       Co-evaluation               AM-PAC PT "6 Clicks" Mobility  Outcome Measure Help needed turning from your back to your side while in a flat bed without using bedrails?: None Help needed moving from lying on your back to sitting on the side of a flat bed without using bedrails?: None Help needed moving to and from a bed to a chair (including a wheelchair)?: None Help needed standing up from a chair using your arms (e.g., wheelchair or bedside chair)?: None Help needed to walk in hospital room?: None Help needed climbing 3-5 steps with a railing? : None 6 Click Score: 24    End of Session   Activity Tolerance: Patient tolerated treatment well Patient left: in bed;with call bell/phone within reach Nurse Communication: Mobility status PT Visit Diagnosis: Unsteadiness on feet (R26.81);Muscle weakness (generalized) (M62.81)    Time: 4580-9983 PT Time Calculation (min)  (ACUTE ONLY): 23 min   Charges:   PT Evaluation $PT Eval Low Complexity: 1 Low PT Treatments $Therapeutic Activity: 8-22 mins        Sonika Levins A. Gilford Rile PT, DPT Acute Rehabilitation Services Pager [phone] Office [phone]   Linna Hoff 03/18/2021, 4:53 PM

## 2021-03-19 ENCOUNTER — Encounter (HOSPITAL_COMMUNITY): Payer: Self-pay | Admitting: Neurosurgery

## 2021-03-21 NOTE — Anesthesia Postprocedure Evaluation (Signed)
Anesthesia Post Note  Patient: William Norman  Procedure(s) Performed: LAMINOTOMY, FORAMINOTOMY, LEFT LUMBAR THREE-FOUR (Left)     Patient location during evaluation: PACU Anesthesia Type: General Level of consciousness: awake and alert Pain management: pain level controlled Vital Signs Assessment: post-procedure vital signs reviewed and stable Respiratory status: spontaneous breathing, nonlabored ventilation, respiratory function stable and patient connected to nasal cannula oxygen Cardiovascular status: blood pressure returned to baseline and stable Postop Assessment: no apparent nausea or vomiting Anesthetic complications: no   No notable events documented.  Last Vitals:  Vitals:   03/18/21 1540 03/18/21 1605  BP: (!) 162/90 (!) 168/90  Pulse: 72 68  Resp: 19 18  Temp:    SpO2: 95% 98%    Last Pain:  Vitals:   03/18/21 1605  TempSrc:   PainSc: 0-No pain                 Melizza Kanode

## 2021-04-19 ENCOUNTER — Ambulatory Visit: Payer: Medicare HMO | Admitting: Family Medicine

## 2021-04-19 ENCOUNTER — Other Ambulatory Visit: Payer: Self-pay

## 2021-04-19 ENCOUNTER — Encounter: Payer: Self-pay | Admitting: Family Medicine

## 2021-04-19 ENCOUNTER — Telehealth: Payer: Self-pay

## 2021-04-19 VITALS — BP 148/98 | HR 92 | Wt 212.2 lb

## 2021-04-19 DIAGNOSIS — M5416 Radiculopathy, lumbar region: Secondary | ICD-10-CM | POA: Diagnosis not present

## 2021-04-19 DIAGNOSIS — M21372 Foot drop, left foot: Secondary | ICD-10-CM | POA: Diagnosis not present

## 2021-04-19 MED ORDER — GABAPENTIN 100 MG PO CAPS: 100.0000 mg | ORAL_CAPSULE | Freq: Three times a day (TID) | ORAL | 2 refills | Status: DC

## 2021-04-19 NOTE — Telephone Encounter (Signed)
Left pt a VM to attempt to figure out why he is coming back for a f/u visit w/ [place] instead of his neurosurgeon. Pt had surgery on 2/2 and it would be more appropriate that he f/u with the surgeon. ?

## 2021-04-19 NOTE — Progress Notes (Signed)
? ?I, Wendy Poet, LAT, ATC, am serving as scribe for Dr. Lynne Leader. ? ?William Norman is a 81 y.o. male who presents to [place] at [facility] today for f/u lumbar radiculopathy w/ L foot drop. Pt was last seen by Dr. Georgina Snell on 02/04/21. Pt was previously referred to neurosurgery on 12/6 but declined the visit. Pt reconsidered the referral after Dr. Clovis Riley f/u visit and was seen at [place] to later have surgery on 03/18/21 (laminotomy, foraminotomy at L3-4). Pt has had prior trial of ESI on 01/25/21 and 01/11/21. Today, pt reports that he con't to have L foot drop and states that his situation is worsening in that he now has pain along his L lateral leg and foot.  He is also experiencing burning pain in his L foot, particularly in the morning. ? ?He followed up with his surgeon on February 23 and was advised to follow-up as needed. ? ?Dx imaging: 03/18/21 L-spine XR ?12/20/20 L-spine MRI ?            02/26/20 L-spine XR ?            02/15/07 L-spine XR ? ?Pertinent review of systems: No fevers or chills ? ?Relevant historical information: Hypertension.  Small bowel obstruction.  Stroke. ? ? ?Exam:  ?BP (!) 148/98 (BP Location: Right Arm, Patient Position: Sitting, Cuff Size: Normal)   Pulse 92   Wt 212 lb 3.2 oz (96.3 kg)   SpO2 94%   BMI 28.78 kg/m?  ?General: Well Developed, well nourished, and in no acute distress.  ? ?MSK: Left foot: Intact sensation.  Foot dorsiflexion strength very slightly improved with twitch to foot dorsiflexion present now. ? ? ? ?Lab and Radiology Results ? ?EXAM: ?MRI LUMBAR SPINE WITHOUT CONTRAST ?  ?TECHNIQUE: ?Multiplanar, multisequence MR imaging of the lumbar spine was ?performed. No intravenous contrast was administered. ?  ?COMPARISON:  None. ?  ?FINDINGS: ?Segmentation:  5 lumbar type vertebrae ?  ?Alignment:  L4-5 grade 1 anterolisthesis.  Mild scoliosis ?  ?Vertebrae:  No fracture, evidence of discitis, or bone  lesion. ?  ?Conus medullaris and cauda equina: Conus extends to the T12-L1 ?level. Conus appears somewhat gracile but this may be related to ?slice selection and minimal coverage. Negative cauda equina. ?  ?Paraspinal and other soft tissues: Bladder wall thickening with ?trabeculation, cellules, diverticula. ?  ?Disc levels: ?  ?T12- L1: Ventral spondylosis ?  ?L1-L2: Disc narrowing and bulging with endplate spurring. ?  ?L2-L3: Disc narrowing and bulging with left foraminal protrusion. ?Asymmetric left facet spurring. Left foraminal impingement. Left ?subarticular recess narrowing although no definite L3 compression ?  ?L3-L4: Disc narrowing and bulging. Degenerative facet spurring and ?ligamentum flavum thickening. Advanced spinal stenosis. Moderate ?bilateral foraminal impingement ?  ?L4-L5: Disc narrowing and bulging. There is asymmetric right-sided ?height loss and ridging. Facet osteoarthritis with severe ?degeneration asymmetric to the right, with anterolisthesis. ?Triangular narrowing of the thecal sac crowding the bilateral L5 ?nerve roots. Advanced right foraminal impingement ?  ?L5-S1:Facet osteoarthritis with spurring and left anterior synovial ?cyst measuring 4 mm. This cyst is noncompressive. Mild disc space ?narrowing. ?  ?IMPRESSION: ?1. Generalized spinal degeneration with scoliosis and L4-5 ?anterolisthesis. ?2. Foraminal impingement on the left at L2-3, L3-4 and right L3-4, ?L4-5. ?3. Spinal stenosis that is advanced at L3-4 and mild to moderate at ?L4-5. ?4. Incidental findings of chronic bladder outlet obstruction. ?  ?  ?Electronically Signed ?  By: Jorje Guild  M.D. ?  On: 12/22/2020 08:07 ?  ?I, Lynne Leader, personally (independently) visualized and performed the interpretation of the images attached in this note. ? ? ? ?Assessment and Plan: ?81 y.o. male with left foot drop postop on February 2.  Notes increased paresthesias along the L4 dermatomal pattern.  This coupled with slight  increase strength I think indicates that as the nerve has been decompressed is now starting to "wake up a little bit".  ?He may have some return of strength or not.  It could be hard to predict.  Patient to prescribe low-dose gabapentin and recommend that he follow back up with his neurosurgeon Dr. Kathyrn Sheriff. ?We could consider another epidural steroid injection however I would like to wait until 6 weeks postop to maximize healing before exposing to steroids if possible. ?I would appreciate input from his neurosurgeon. ? ?Recheck back with me as needed.  Recommend AFO.  He still declines this. ? ?PDMP not reviewed this encounter. ?No orders of the defined types were placed in this encounter. ? ?Meds ordered this encounter  ?Medications  ? gabapentin (NEURONTIN) 100 MG capsule  ?  Sig: Take 1 capsule (100 mg total) by mouth 3 (three) times daily.  ?  Dispense:  90 capsule  ?  Refill:  2  ? ? ? ?Discussed warning signs or symptoms. Please see discharge instructions. Patient expresses understanding. ? ? ?The above documentation has been reviewed and is accurate and complete Lynne Leader, M.D. ? ? ?

## 2021-04-19 NOTE — Patient Instructions (Addendum)
Good to see you today. ? ?Please take Gabapentin '100mg'$  three times a day as needed. ? ?Schedule a follow-up w/ your surgeon. ? ?Follow-up: as needed ?

## 2021-05-03 ENCOUNTER — Ambulatory Visit: Payer: Medicare HMO | Admitting: Diagnostic Neuroimaging

## 2021-05-03 ENCOUNTER — Encounter: Payer: Self-pay | Admitting: Diagnostic Neuroimaging

## 2021-05-03 VITALS — BP 145/85 | HR 97 | Ht 72.0 in | Wt 208.5 lb

## 2021-05-03 DIAGNOSIS — G3184 Mild cognitive impairment, so stated: Secondary | ICD-10-CM | POA: Diagnosis not present

## 2021-05-03 DIAGNOSIS — G250 Essential tremor: Secondary | ICD-10-CM | POA: Diagnosis not present

## 2021-05-03 NOTE — Patient Instructions (Signed)
?  MILD MEMORY LOSS (mild cognitive impairment) ?- safety / supervision issues reviewed ?- daily physical activity / exercise (at least 15-30 minutes) ?- eat more plants / vegetables ?- increase social activities, brain stimulation, games, puzzles, hobbies, crafts, arts, music ?- aim for at least 7-8 hours sleep per night (or more) ?- avoid smoking and alcohol ?- caregiver resources provided ?- caution with medications, finances, driving ? ?ESSENTIAL TREMOR ?- monitor for now; consider increasing beta blocker; not good candidate for primidone due to side effects and drug interactions ?

## 2021-05-03 NOTE — Progress Notes (Signed)
? ?[facility] ? ?PATIENT: William Norman ?DOB: [birth year] ? ?REFERRING CLINICIAN: Richardo Priest, MD ?HISTORY FROM: patient  ?REASON FOR VISIT: new consult  ? ? ?HISTORICAL ? ?CHIEF COMPLAINT:  ?Chief Complaint  ?Patient presents with  ? New Patient (Initial Visit)  ?  Rm 7 alone here for consult on worsening memory. Feels like symptoms have been present for the last year bust has worsened over the last 6 months.   ? ? ?HISTORY OF PRESENT ILLNESS:  ? ?81 year old male here for evaluation of mild memory loss.  Symptoms happening for past 1 year.  Also has some postural and action tremor. Patient lives with his wife and daughter.  He is independent with his ADLs.  ? ?Has had some chronic pain since his low back surgery last month.  Has left foot drop weakness. ? ?No major issues with anxiety, depression or sleep. ? ? ?REVIEW OF SYSTEMS: Full 14 system review of systems performed and negative with exception of: as per HPI. ? ?ALLERGIES: ?Allergies  ?Allergen Reactions  ? Other Other (See Comments)  ?  Some form of numbing medication used by dentist, almost died  ? Simvastatin Other (See Comments)  ?  Muscle aches ?  ? ? ?HOME MEDICATIONS: ?Outpatient Medications Prior to Visit  ?Medication Sig Dispense Refill  ? acetaminophen (ACETAMINOPHEN 8 HOUR) 650 MG CR tablet Take 1 tablet (650 mg total) by mouth every 8 (eight) hours as needed for pain. 270 tablet 3  ? apixaban (ELIQUIS) 5 MG TABS tablet Take 1 tablet (5 mg total) by mouth 2 (two) times daily. 60 tablet 11  ? atenolol (TENORMIN) 25 MG tablet Take 1 tablet (25 mg total) by mouth daily. 90 tablet 1  ? atorvastatin (LIPITOR) 40 MG tablet TAKE 1/2 TABLET BY MOUTH DAILY 45 tablet 3  ? Coenzyme Q10 (CO Q-10) 300 MG CAPS Take 300 mg by mouth daily.    ? Cyanocobalamin (VITAMIN B-12) 1000 MCG SUBL Place 1 tablet (1,000 mcg total) under the tongue daily. 90 tablet 0  ? hydroxypropyl methylcellulose / hypromellose (ISOPTO TEARS / GONIOVISC)  2.5 % ophthalmic solution Place 1 drop into both eyes 2 (two) times daily as needed for dry eyes.    ? nitroGLYCERIN (NITROSTAT) 0.4 MG SL tablet Dissolve 1 tablet under the tongue every 5 minutes as needed 90 tablet 1  ? oxymetazoline (AFRIN) 0.05 % nasal spray Place 2 sprays into both nostrils at bedtime as needed for congestion.    ? furosemide (LASIX) 20 MG tablet Take 1 tablet (20 mg total) by mouth daily. 90 tablet 3  ? Coenzyme Q10 (COQ10) 200 MG CAPS Take 1 capsule by mouth daily. 90 capsule 0  ? gabapentin (NEURONTIN) 100 MG capsule Take 1 capsule (100 mg total) by mouth 3 (three) times daily. 90 capsule 2  ? methocarbamol (ROBAXIN) 500 MG tablet Take 1 tablet (500 mg total) by mouth every 6 (six) hours as needed for muscle spasms. 30 tablet 0  ? oxyCODONE (OXY IR/ROXICODONE) 5 MG immediate release tablet Take 1 tablet (5 mg total) by mouth every 3 (three) hours as needed for moderate pain ((score 4 to 6)). 30 tablet 0  ? ?No facility-administered medications prior to visit.  ? ? ? ? ?PHYSICAL EXAM ? ?GENERAL EXAM/CONSTITUTIONAL: ?Vitals:  ?Vitals:  ? 05/03/21 1020  ?BP: (!) 145/85  ?Pulse: 97  ?Weight: 208 lb 8 oz (94.6 kg)  ?Height: 6' (1.829 m)  ? ?Body mass index is 28.28 kg/m?. ?Wt  Readings from Last 3 Encounters:  ?05/03/21 208 lb 8 oz (94.6 kg)  ?04/19/21 212 lb 3.2 oz (96.3 kg)  ?03/18/21 206 lb (93.4 kg)  ? ?Patient is in no distress; well developed, nourished and groomed; neck is supple ? ?CARDIOVASCULAR: ?Examination of carotid arteries is normal; no carotid bruits ?IRREGULAR RHYTHM ?Examination of peripheral vascular system by observation and palpation is normal ? ?EYES: ?Ophthalmoscopic exam of optic discs and posterior segments is normal; no papilledema or hemorrhages ?No results found. ? ?MUSCULOSKELETAL: ?Gait, strength, tone, movements noted in Neurologic exam below ? ?NEUROLOGIC: ?MENTAL STATUS:  ?MMSE - Mini Mental State Exam 05/03/2021 07/13/2017 06/09/2015  ?Not completed: - (No Data) (No  Data)  ?Orientation to time 5 - -  ?Orientation to Place 5 - -  ?Registration 3 - -  ?Attention/ Calculation 5 - -  ?Recall 3 - -  ?Language- name 2 objects 2 - -  ?Language- repeat 1 - -  ?Language- follow 3 step command 3 - -  ?Language- read & follow direction 1 - -  ?Write a sentence 1 - -  ?Copy design 1 - -  ?Total score 30 - -  ? ?awake, alert, oriented to person, place and time ?recent and remote memory intact ?normal attention and concentration ?language fluent, comprehension intact, naming intact ?fund of knowledge appropriate ? ?CRANIAL NERVE:  ?2nd - no papilledema on fundoscopic exam ?2nd, 3rd, 4th, 6th - pupils equal and reactive to light, visual fields full to confrontation, extraocular muscles intact, no nystagmus ?5th - facial sensation symmetric ?7th - facial strength symmetric ?8th - hearing intact ?9th - palate elevates symmetrically, uvula midline ?11th - shoulder shrug symmetric ?12th - tongue protrusion midline ? ?MOTOR:  ?normal bulk and tone, full strength in the BUE, BLE; EXCEPT LEFT FOOT DF 0 ?MILD POSTURAL AND ACTION TREMOR ? ?SENSORY:  ?normal and symmetric to light touch, temperature, vibration ? ?COORDINATION:  ?finger-nose-finger, fine finger movements normal ? ?REFLEXES:  ?deep tendon reflexes TRACE and symmetric ? ?GAIT/STATION:  ?narrow based gait ? ? ? ? ?DIAGNOSTIC DATA (LABS, IMAGING, TESTING) ?- I reviewed patient records, labs, notes, testing and imaging myself where available. ? ?Lab Results  ?Component Value Date  ? WBC 8.8 03/15/2021  ? HGB 15.1 03/15/2021  ? HCT 48.5 03/15/2021  ? MCV 88.7 03/15/2021  ? PLT 215 03/15/2021  ? ?   ?Component Value Date/Time  ? NA 142 03/15/2021 1418  ? NA 141 11/17/2020 1437  ? K 4.6 03/15/2021 1418  ? CL 103 03/15/2021 1418  ? CO2 29 03/15/2021 1418  ? GLUCOSE 99 03/15/2021 1418  ? GLUCOSE 94 01/10/2006 1108  ? BUN 17 03/15/2021 1418  ? BUN 16 11/17/2020 1437  ? CREATININE 0.80 03/15/2021 1418  ? CREATININE 0.87 10/02/2020 1626  ? CALCIUM  9.6 03/15/2021 1418  ? PROT 6.5 10/02/2020 1626  ? PROT 6.5 12/03/2019 1509  ? ALBUMIN 2.0 (L) 09/17/2020 1245  ? ALBUMIN 4.2 12/03/2019 1509  ? AST 91 (H) 10/02/2020 1626  ? ALT 79 (H) 10/02/2020 1626  ? ALKPHOS 123 09/17/2020 1245  ? BILITOT 0.7 10/02/2020 1626  ? BILITOT 0.9 12/03/2019 1509  ? GFRNONAA >60 03/15/2021 1418  ? GFRAA 97 12/03/2019 1509  ? ?Lab Results  ?Component Value Date  ? CHOL 124 07/20/2020  ? HDL 49.60 07/20/2020  ? [place] 60 07/20/2020  ? LDLDIRECT 131.5 04/22/2010  ? TRIG 54 09/14/2020  ? CHOLHDL 2 07/20/2020  ? ?Lab Results  ?Component Value Date  ?  HGBA1C 5.9 07/20/2020  ? ?Lab Results  ?Component Value Date  ? ZOXWRUEA54 098 (H) 07/20/2020  ? ?Lab Results  ?Component Value Date  ? TSH 3.18 07/20/2020  ? ? ?02/16/20 MRI brain ?- Acute infarct left posterior insula and parietal operculum without ?hemorrhage ?- Moderate chronic microvascular ischemic change in the white matter. ? ? ?12/20/20 MRI lumbar  ?1. Generalized spinal degeneration with scoliosis and L4-5 ?anterolisthesis. ?2. Foraminal impingement on the left at L2-3, L3-4 and right L3-4, ?L4-5. ?3. Spinal stenosis that is advanced at L3-4 and mild to moderate at ?L4-5. ?4. Incidental findings of chronic bladder outlet obstruction. ? ? ? ?ASSESSMENT AND PLAN ? ?81 y.o. year old male here with: ? ? ?Dx: ? ?1. Mild cognitive impairment with memory loss   ?2. Essential tremor   ? ? ? ?PLAN: ? ?MILD MEMORY LOSS (mild cognitive impairment) ?- safety / supervision issues reviewed ?- daily physical activity / exercise (at least 15-30 minutes) ?- eat more plants / vegetables ?- increase social activities, brain stimulation, games, puzzles, hobbies, crafts, arts, music ?- aim for at least 7-8 hours sleep per night (or more) ?- avoid smoking and alcohol ?- caregiver resources provided ?- caution with medications, finances, driving ? ?ESSENTIAL TREMOR ?- monitor for now; consider increasing beta blocker; not good candidate for primidone due to  side effects and drug interactions ? ?Return for return to PCP, pending if symptoms worsen or fail to improve. ? ? ? ?Penni Bombard, MD 03/04/1476, 29:56 AM ?Certified in Neurology, Neurophysiology a

## 2021-05-04 ENCOUNTER — Ambulatory Visit: Payer: Medicare HMO | Admitting: Podiatry

## 2021-05-04 ENCOUNTER — Other Ambulatory Visit: Payer: Self-pay

## 2021-05-04 DIAGNOSIS — M79676 Pain in unspecified toe(s): Secondary | ICD-10-CM | POA: Diagnosis not present

## 2021-05-04 DIAGNOSIS — B351 Tinea unguium: Secondary | ICD-10-CM | POA: Diagnosis not present

## 2021-05-04 DIAGNOSIS — D689 Coagulation defect, unspecified: Secondary | ICD-10-CM

## 2021-05-04 DIAGNOSIS — I872 Venous insufficiency (chronic) (peripheral): Secondary | ICD-10-CM

## 2021-05-04 NOTE — Progress Notes (Signed)
This patient returns to my office for at risk foot care.  This patient requires this care by a professional since this patient will be at risk due to having venous insufficiency and coagulation defect.  Patient is taking coumadin.  This patient is unable to cut nails himself since the patient cannot reach his nails.These nails are painful walking and wearing shoes.This patient presents for at risk foot care today. Patient has developed dropfoot left foot from back problem. ? ?General Appearance  Alert, conversant and in no acute stress. ? ?Vascular  Dorsalis pedis and posterior tibial  pulses are palpable  bilaterally.  Capillary return is within normal limits  bilaterally. Temperature is within normal limits  bilaterally. ? ?Neurologic  Senn-Weinstein monofilament wire test within normal limits  bilaterally. Muscle power within normal limits bilaterally. ? ?Nails Thick disfigured discolored nails with subungual debris  from hallux to fifth toes bilaterally. No evidence of bacterial infection or drainage bilaterally.  ? ?Orthopedic  No limitations of motion  feet .  No crepitus or effusions noted.  No bony pathology or digital deformities noted. ? ?Skin  normotropic skin  noted bilaterally.  No signs of infections or ulcers noted.   Asymptomatic porokeratosis sub 5th met left foot. ? ?Onychomycosis  Pain in right toes  Pain in left toes  Porokeratosis left foot. ? ?Consent was obtained for treatment procedures.   Mechanical debridement of nails 1-5  bilaterally performed with a nail nipper.  Filed with dremel without incident.  ? ? ?Return office visit   10 weeks                  Told patient to return for periodic foot care and evaluation due to potential at risk complications. ? ? ?Gardiner Barefoot DPM  ?

## 2021-05-27 ENCOUNTER — Emergency Department (INDEPENDENT_AMBULATORY_CARE_PROVIDER_SITE_OTHER): Payer: Medicare HMO

## 2021-05-27 ENCOUNTER — Emergency Department
Admission: EM | Admit: 2021-05-27 | Discharge: 2021-05-27 | Disposition: A | Discharge status: Home / Self Care | Payer: Medicare HMO | Source: Home / Self Care

## 2021-05-27 DIAGNOSIS — R0781 Pleurodynia: Secondary | ICD-10-CM | POA: Diagnosis not present

## 2021-05-27 IMAGING — DX DG RIBS W/ CHEST 3+V*R*
3 series · 3 of 3 positions shown · non-contrast
Comparison: Chest radiograph [MU]

CLINICAL DATA: Right rib pain

EXAM:
RIGHT RIBS AND CHEST - 3+ VIEW

[chest pa]
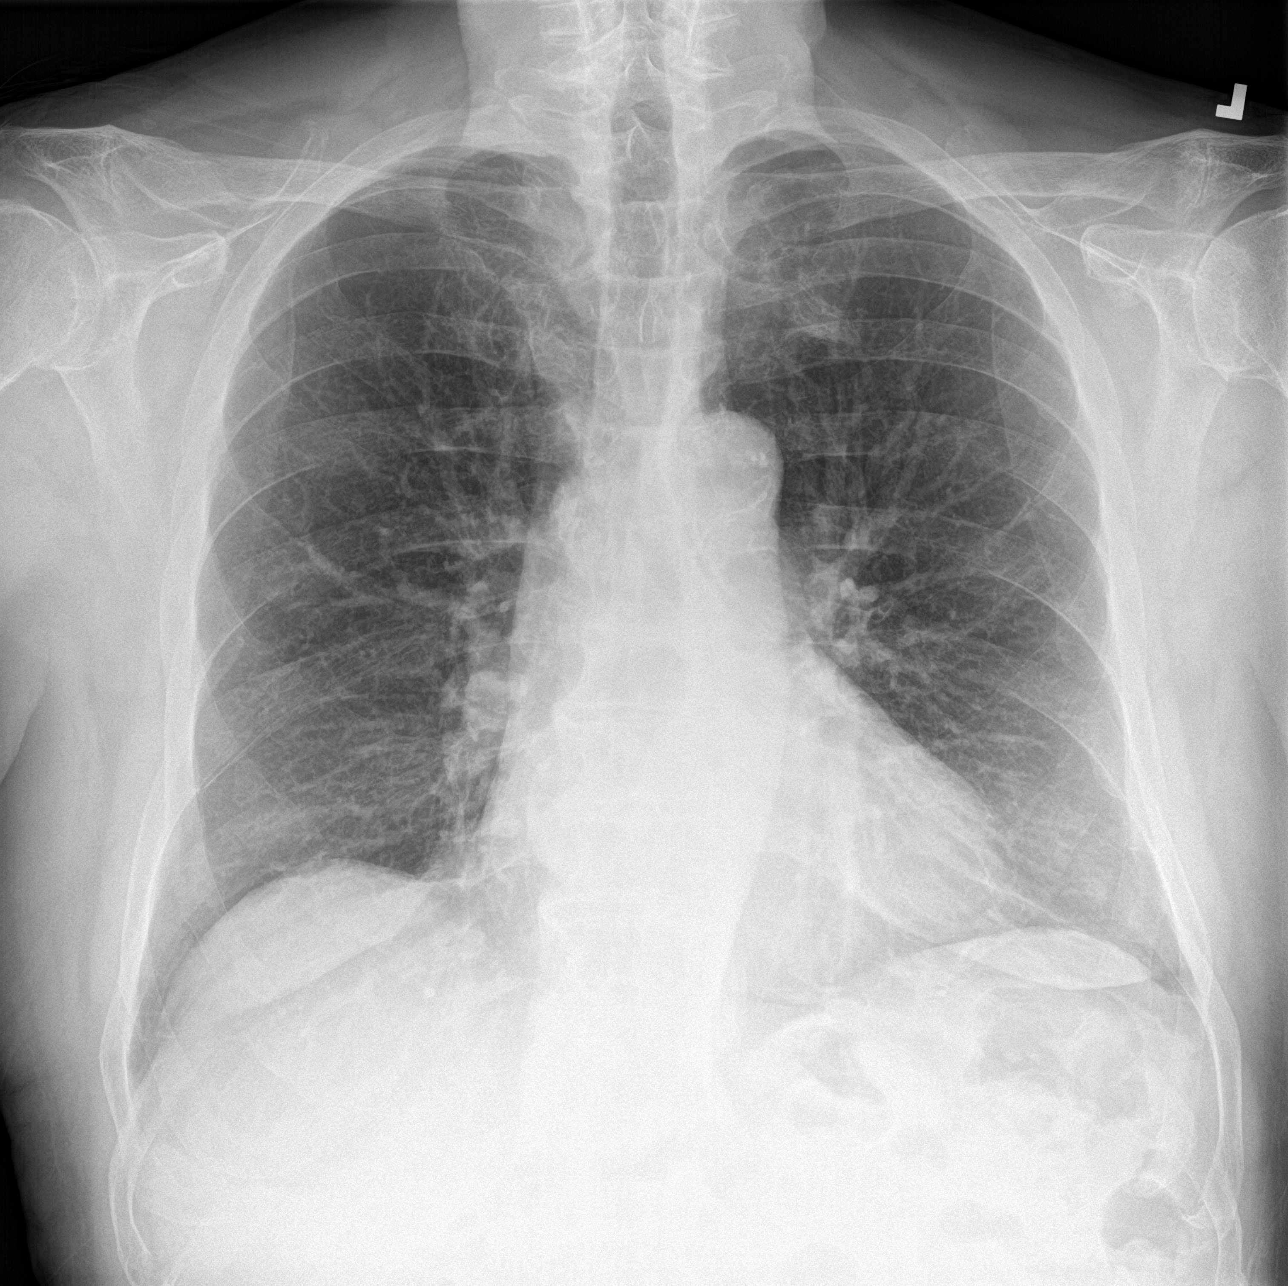

[rib pa]
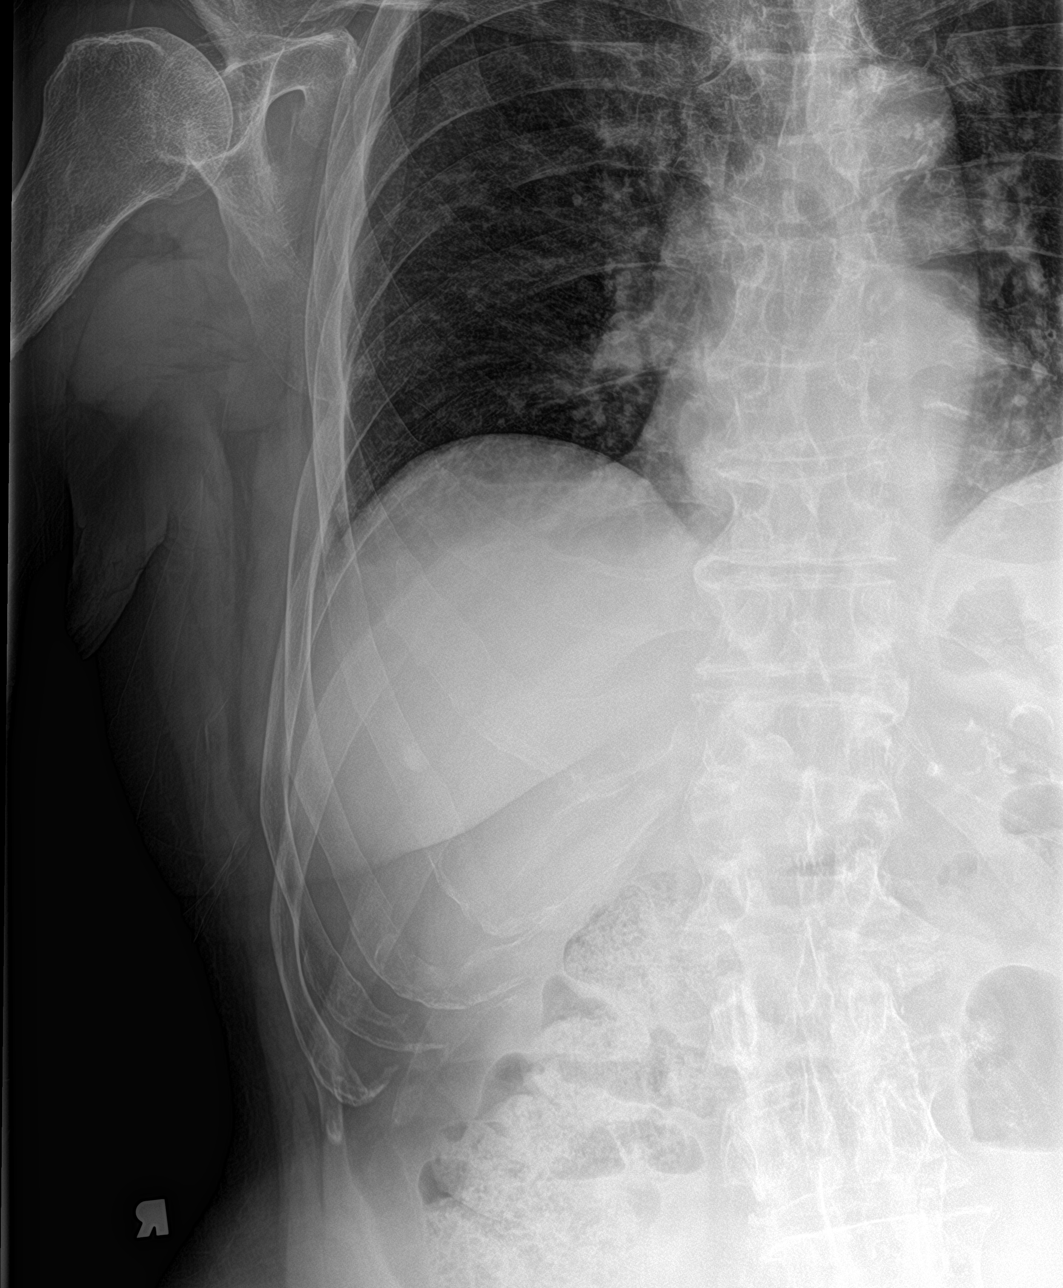

[rib pa obl]
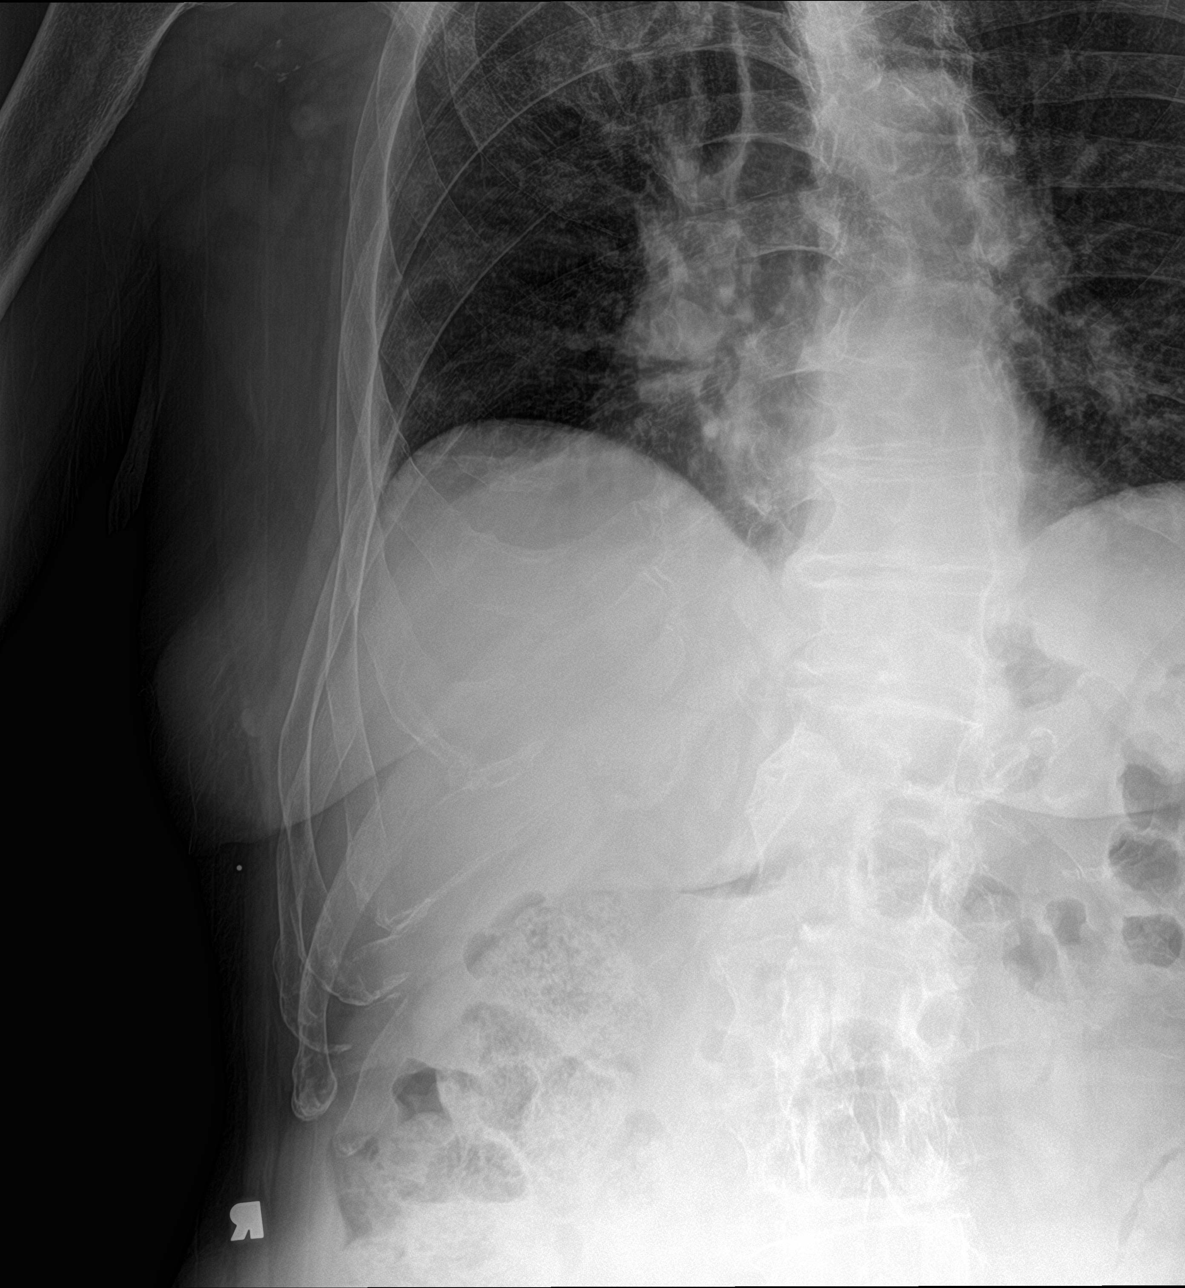

[3 of 3 positions shown; findings below may reference images not displayed]

FINDINGS: No fracture or other bone lesions are seen involving the ribs. There
is no evidence of pneumothorax or pleural effusion. Both lungs are
clear. Heart size and mediastinal contours are within normal limits.
IMPRESSION: Negative.

## 2021-05-27 MED ORDER — TRAMADOL HCL 50 MG PO TABS: 50.0000 mg | ORAL_TABLET | Freq: Every day | ORAL | 0 refills | Status: DC | PRN

## 2021-05-27 NOTE — Discharge Instructions (Addendum)
Advised patient chest x-ray results were clear with no acute cardiopulmonary process or rib fracture.  Hard copy provided to patient with AVS this evening.  Advised patient may take pain medication tramadol daily or as needed for right-sided rib pain.  Advised/encouraged patient to increase daily water intake while taking this medication.  Advised patient if symptoms worsen and/or unresolved please follow-up with PCP or here for further evaluation. ?

## 2021-05-27 NOTE — ED Provider Notes (Signed)
?[facility] ? ? ? ?CSN: [id number] ?Arrival date & time: 05/27/21  1516 ? ? ?  ? ?History   ?Chief Complaint ?Chief Complaint  ?Patient presents with  ? Chest Pain  ?  Rt rib  ? ? ?HPI ?William Norman is a 81 y.o. male.  ? ?HPI Very pleasant 81 year old male presents with right-sided rib pain for 2 days.  Patient reports was performing activities at home and performed a pulling or pushing of movement and felt a pop over right lower rib area.  PMH significant for CAD, persistent A-fib, CVA, DVT, and long-term current use of anticoagulant therapy.  Patient is currently on apixaban and denies any unusual bleeding.  Patient denies anginal equivalents, pain while breathing, lightheadedness, dizziness, presyncopal or syncopal episodes. ? ?Past Medical History:  ?Diagnosis Date  ? BPH (benign prostatic hypertrophy)   ? BPH associated with nocturia 08/29/2013  ? BPH with obstruction/lower urinary tract symptoms 08/14/2019  ? CAD (coronary artery disease)   ? CAD (coronary artery disease), native coronary artery   ? PTCA of RCA 1986, PTCA of LAD 1992,  Negative treadmill Cardiolite 2011   ? Chronic dermatitis of hands 10/12/2015  ? Chronic venous insufficiency   ? Coagulation disorder ([place]) 10/26/2018  ? DVT (deep venous thrombosis) ([place])   ? Dyslipidemia   ? Dysrhythmia   ? Eczema   ? Erectile dysfunction 07/11/2012  ? Essential hypertension   ? GERD 08/04/2006  ?    ? GERD (gastroesophageal reflux disease)   ? Hyperglycemia 07/08/2019  ? Hyperlipidemia   ? Hypertension   ? Incomplete emptying of bladder 08/14/2019  ? Long term current use of anticoagulant therapy   ? Lumbar disc disease   ? Overweight (BMI 25.0-29.9) 06/18/2015  ? Persistent atrial fibrillation ([place]) 04/10/2018  ? Personal history of DVT (deep vein thrombosis)   ? Initially in 1998 with recurrence in 2002 now on chronic warfarin   ? Soft tissue lesion of foot 08/27/2015  ? Stroke [facility])   ? Urinary tract infection with hematuria 08/14/2019   ? ? ?Patient Active Problem List  ? Diagnosis Date Noted  ? Lumbar spinal stenosis 03/18/2021  ? Osteoarthritis 11/20/2020  ? Constipation 10/02/2020  ? Protein-calorie malnutrition, severe 09/03/2020  ? SBO (small bowel obstruction) ([place]) 08/28/2020  ? AAA (abdominal aortic aneurysm) ([place]) 03/06/2020  ? Porokeratosis 02/25/2020  ? History of ischemic stroke 02/16/2020  ? Eczema   ? DVT (deep venous thrombosis) ([place])   ? CAD (coronary artery disease)   ? Recurrent UTI 08/14/2019  ? Hyperglycemia 07/08/2019  ? Persistent atrial fibrillation ([place]) 04/10/2018  ? Chronic dermatitis of hands 10/12/2015  ? Soft tissue lesion of foot 08/27/2015  ? Stroke ([place]) 06/18/2015  ? BPH associated with nocturia 08/29/2013  ? CAD (coronary artery disease), native coronary artery   ? Long term current use of anticoagulant therapy   ? Personal history of DVT (deep vein thrombosis)   ? Chronic venous insufficiency   ? Erectile dysfunction 07/11/2012  ? Lumbar disc disease   ? GERD 08/04/2006  ? Dyslipidemia   ? Essential hypertension   ? ? ?Past Surgical History:  ?Procedure Laterality Date  ? APPLICATION OF WOUND VAC  09/02/2020  ? Procedure: APPLICATION OF WOUND VAC;  Surgeon: Donnie Mesa, MD;  Location: [facility];  Service: General;;  ? CARDIAC CATHETERIZATION    ? CARPAL TUNNEL RELEASE  04/21/2011  ? Procedure: CARPAL TUNNEL RELEASE;  Surgeon: Tennis Must, MD;  Location: [facility];  Service: Orthopedics;  Laterality: Left;  ? CARPAL TUNNEL RELEASE  05/23/2011  ? Procedure: CARPAL TUNNEL RELEASE;  Surgeon: Tennis Must, MD;  Location: [facility];  Service: Orthopedics;  Laterality: Right;  ? COLON SURGERY  2010 and 2011 colostomy reversal  ? CORONARY ANGIOPLASTY    ? EYE SURGERY    ? repair of macular hole  ? LAPAROTOMY N/A 09/02/2020  ? Procedure: EXPLORATORY LAPAROTOMY, LYSIS OF ADHESIONS ,BOWEL RESECTION;  Surgeon: Donnie Mesa, MD;  Location: [facility];  Service: General;  Laterality: N/A;  ? LUMBAR  LAMINECTOMY/DECOMPRESSION MICRODISCECTOMY Left 03/18/2021  ? Procedure: LAMINOTOMY, FORAMINOTOMY, LEFT LUMBAR THREE-FOUR;  Surgeon: Consuella Lose, MD;  Location: [facility];  Service: Neurosurgery;  Laterality: Left;  ? NASAL SEPTUM SURGERY    ? precutaneous transluminal coronary angioplasty    ? right hernia    ? TONSILLECTOMY AND ADENOIDECTOMY    ? ? ? ? ? ?Home Medications   ? ?Prior to Admission medications   ?Medication Sig Start Date End Date Taking? Authorizing Provider  ?traMADol (ULTRAM) 50 MG tablet Take 1 tablet (50 mg total) by mouth daily as needed. 05/27/21  Yes Eliezer Lofts, FNP  ?acetaminophen (ACETAMINOPHEN 8 HOUR) 650 MG CR tablet Take 1 tablet (650 mg total) by mouth every 8 (eight) hours as needed for pain. 11/20/20   Vivi Barrack, MD  ?apixaban (ELIQUIS) 5 MG TABS tablet Take 1 tablet (5 mg total) by mouth 2 (two) times daily. 03/20/21   Consuella Lose, MD  ?atenolol (TENORMIN) 25 MG tablet Take 1 tablet (25 mg total) by mouth daily. 12/31/20   Vivi Barrack, MD  ?atorvastatin (LIPITOR) 40 MG tablet TAKE 1/2 TABLET BY MOUTH DAILY 07/03/20   Vivi Barrack, MD  ?Coenzyme Q10 (CO Q-10) 300 MG CAPS Take 300 mg by mouth daily.    [provider]  ?Cyanocobalamin (VITAMIN B-12) 1000 MCG SUBL Place 1 tablet (1,000 mcg total) under the tongue daily. 07/24/20   Vivi Barrack, MD  ?furosemide (LASIX) 20 MG tablet Take 1 tablet (20 mg total) by mouth daily. 11/10/20 03/18/21  Richardo Priest, MD  ?hydroxypropyl methylcellulose / hypromellose (ISOPTO TEARS / GONIOVISC) 2.5 % ophthalmic solution Place 1 drop into both eyes 2 (two) times daily as needed for dry eyes.    [provider]  ?nitroGLYCERIN (NITROSTAT) 0.4 MG SL tablet Dissolve 1 tablet under the tongue every 5 minutes as needed 03/09/21   Richardo Priest, MD  ?oxymetazoline (AFRIN) 0.05 % nasal spray Place 2 sprays into both nostrils at bedtime as needed for congestion.    [provider]  ? ? ?Family History ?Family  History  ?Problem Relation Age of Onset  ? Heart disease Father   ? Heart attack Father   ? Atrial fibrillation Father   ? Heart disease Mother   ? Atrial fibrillation Mother   ? Stroke Mother   ? AAA (abdominal aortic aneurysm) Brother   ? ? ?Social History ?Social History  ? ?Tobacco Use  ? Smoking status: Former  ?  Packs/day: 1.00  ?  Years: 60.00  ?  Pack years: 60.00  ?  Types: Cigarettes  ?  Quit date: 03/25/1998  ?  Years since quitting: 23.1  ? Smokeless tobacco: Never  ?Vaping Use  ? Vaping Use: Never used  ?Substance Use Topics  ? Alcohol use: Not Currently  ?  Comment: once a month  ? Drug use: No  ? ? ? ?  Allergies   ?Other and Simvastatin ? ? ?Review of Systems ?Review of Systems  ?Musculoskeletal:   ?     Right-sided rib pain x2 days  ?All other systems reviewed and are negative. ? ? ?Physical Exam ?Triage Vital Signs ?ED Triage Vitals  ?Enc Vitals Group  ?   BP 05/27/21 1542 (!) 142/81  ?   Pulse Rate 05/27/21 1542 78  ?   Resp 05/27/21 1542 14  ?   Temp 05/27/21 1542 97.9 ?F (36.6 ?C)  ?   Temp Source 05/27/21 1542 Oral  ?   SpO2 05/27/21 1542 96 %  ?   Weight --   ?   Height --   ?   Head Circumference --   ?   Peak Flow --   ?   Pain Score 05/27/21 1545 0  ?   Pain Loc --   ?   Pain Edu? --   ?   Excl. in [place]? --   ? ?No data found. ? ?Updated Vital Signs ?BP (!) 142/81 (BP Location: Right Arm)   Pulse 78   Temp 97.9 ?F (36.6 ?C) (Oral)   Resp 14   SpO2 96%  ? ? ?Physical Exam ?Vitals and nursing note reviewed.  ?Constitutional:   ?   General: He is not in acute distress. ?   Appearance: Normal appearance. He is normal weight. He is not ill-appearing.  ?HENT:  ?   Head: Normocephalic and atraumatic.  ?   Mouth/Throat:  ?   Mouth: Mucous membranes are moist.  ?   Pharynx: Oropharynx is clear.  ?Eyes:  ?   Extraocular Movements: Extraocular movements intact.  ?   Conjunctiva/sclera: Conjunctivae normal.  ?   Pupils: Pupils are equal, round, and reactive to light.  ?Cardiovascular:  ?   Pulses:  Normal pulses.  ?   Heart sounds: Normal heart sounds. No murmur heard. ?  No gallop.  ?   Comments: Irregularly irregular ?Pulmonary:  ?   Effort: Pulmonary effort is normal.  ?   Breath sounds: Normal breath s

## 2021-05-27 NOTE — ED Triage Notes (Signed)
Pt presents with intermittent rt rib pain x 2 days. Pt states that two days ago he experienced what felt like a "balloon popping" ?

## 2021-06-07 DIAGNOSIS — M25562 Pain in left knee: Secondary | ICD-10-CM | POA: Diagnosis not present

## 2021-06-07 DIAGNOSIS — Z6828 Body mass index (BMI) 28.0-28.9, adult: Secondary | ICD-10-CM | POA: Diagnosis not present

## 2021-06-14 ENCOUNTER — Telehealth: Payer: Self-pay | Admitting: Family Medicine

## 2021-06-14 NOTE — Telephone Encounter (Signed)
Please advise 

## 2021-06-14 NOTE — Telephone Encounter (Signed)
Yes that is ok. ? ?William Norman. William Pain, MD ?06/14/2021 3:43 PM  ? ?

## 2021-06-14 NOTE — Telephone Encounter (Signed)
Pt would like to get a handicap placard due to his surgery this past Feb. Please advise ?

## 2021-06-15 NOTE — Telephone Encounter (Signed)
Called pt and lvm that handicap place card form in the front office ready for pick up.  ?

## 2021-06-17 ENCOUNTER — Ambulatory Visit: Payer: Medicare HMO | Admitting: Orthopaedic Surgery

## 2021-06-17 ENCOUNTER — Encounter: Payer: Self-pay | Admitting: Orthopaedic Surgery

## 2021-06-17 DIAGNOSIS — M25562 Pain in left knee: Secondary | ICD-10-CM | POA: Diagnosis not present

## 2021-06-17 DIAGNOSIS — G8929 Other chronic pain: Secondary | ICD-10-CM | POA: Diagnosis not present

## 2021-06-17 MED ORDER — BUPIVACAINE HCL 0.5 % IJ SOLN
2.0000 mL | INTRAMUSCULAR | Status: AC | PRN
  Administered 2021-06-17: 2 mL via INTRA_ARTICULAR

## 2021-06-17 MED ORDER — LIDOCAINE HCL 1 % IJ SOLN
2.0000 mL | INTRAMUSCULAR | Status: AC | PRN
  Administered 2021-06-17: 2 mL

## 2021-06-17 MED ORDER — METHYLPREDNISOLONE ACETATE 40 MG/ML IJ SUSP
40.0000 mg | INTRAMUSCULAR | Status: AC | PRN
  Administered 2021-06-17: 40 mg via INTRA_ARTICULAR

## 2021-06-17 NOTE — Progress Notes (Signed)
? ?Office Visit Note ?  ?Patient: William Norman           ?Date of Birth: 05/09/1940           ?MRN: [id number] ?Visit Date: 06/17/2021 ?             ?Requested by: Vivi Barrack, MD ?[place] ?[place],  [place] [address] ?PCP: Vivi Barrack, MD ? ? ?Assessment & Plan: ?Visit Diagnoses:  ?1. Chronic pain of left knee   ? ? ?Plan: Impression is left knee pain.  Sounds like he has symptomatic arthritis.  For the weakness I think outpatient PT may be beneficial.  He tolerated the cortisone injection well today.  We will see him back as needed. ? ?Follow-Up Instructions: No follow-ups on file.  ? ?Orders:  ?Orders Placed This Encounter  ?Procedures  ? Ambulatory referral to Physical Therapy  ? ?No orders of the defined types were placed in this encounter. ? ? ? ? Procedures: ?Large Joint Inj: L knee on 06/17/2021 11:20 AM ?Details: 22 G needle ?Medications: 2 mL bupivacaine 0.5 %; 2 mL lidocaine 1 %; 40 mg methylPREDNISolone acetate 40 MG/ML ?Outcome: tolerated well, no immediate complications ?Patient was prepped and draped in the usual sterile fashion.  ? ? ? ? ?Clinical Data: ?No additional findings. ? ? ?Subjective: ?Chief Complaint  ?Patient presents with  ? Left Knee - Pain  ? ? ?HPI ? ?William Norman is a very pleasant 81 year old gentleman here for evaluation of left knee pain.  Referral from Dr. Kathyrn Sheriff.  He has pain that started a year ago of insidious onset.  He has trouble lifting his leg and raising it.  Walks with a cane.  Currently takes Tylenol which gives temporary relief.  Feels like the knee wants to give out.  Denies any mechanical symptoms. ? ?Review of Systems  ?Constitutional: Negative.   ?All other systems reviewed and are negative. ? ? ?Objective: ?Vital Signs: There were no vitals taken for this visit. ? ?Physical Exam ?Vitals and nursing note reviewed.  ?Constitutional:   ?   Appearance: He is well-developed.  ?Pulmonary:  ?   Effort: Pulmonary effort is normal.  ?Abdominal:  ?    Palpations: Abdomen is soft.  ?Skin: ?   General: Skin is warm.  ?Neurological:  ?   Mental Status: He is alert and oriented to person, place, and time.  ?Psychiatric:     ?   Behavior: Behavior normal.     ?   Thought Content: Thought content normal.     ?   Judgment: Judgment normal.  ? ? ?Ortho Exam ? ?Examination of left hip shows no pain with circumduction or flexion. ?Examination of the left knee shows no effusion.  Collaterals and cruciates are stable.  Moderate pain with flexion. ? ?Specialty Comments:  ?No specialty comments available. ? ?Imaging: ?No results found. ? ? ?PMFS History: ?Patient Active Problem List  ? Diagnosis Date Noted  ? Lumbar spinal stenosis 03/18/2021  ? Osteoarthritis 11/20/2020  ? Constipation 10/02/2020  ? Protein-calorie malnutrition, severe 09/03/2020  ? SBO (small bowel obstruction) ([place]) 08/28/2020  ? AAA (abdominal aortic aneurysm) ([place]) 03/06/2020  ? Porokeratosis 02/25/2020  ? History of ischemic stroke 02/16/2020  ? Eczema   ? DVT (deep venous thrombosis) ([place])   ? CAD (coronary artery disease)   ? Recurrent UTI 08/14/2019  ? Hyperglycemia 07/08/2019  ? Persistent atrial fibrillation ([place]) 04/10/2018  ? Chronic dermatitis of hands 10/12/2015  ? Soft tissue  lesion of foot 08/27/2015  ? Stroke ([place]) 06/18/2015  ? BPH associated with nocturia 08/29/2013  ? CAD (coronary artery disease), native coronary artery   ? Long term current use of anticoagulant therapy   ? Personal history of DVT (deep vein thrombosis)   ? Chronic venous insufficiency   ? Erectile dysfunction 07/11/2012  ? Lumbar disc disease   ? GERD 08/04/2006  ? Dyslipidemia   ? Essential hypertension   ? ?Past Medical History:  ?Diagnosis Date  ? BPH (benign prostatic hypertrophy)   ? BPH associated with nocturia 08/29/2013  ? BPH with obstruction/lower urinary tract symptoms 08/14/2019  ? CAD (coronary artery disease)   ? CAD (coronary artery disease), native coronary artery   ? PTCA of RCA 1986, PTCA of LAD 1992,   Negative treadmill Cardiolite 2011   ? Chronic dermatitis of hands 10/12/2015  ? Chronic venous insufficiency   ? Coagulation disorder ([place]) 10/26/2018  ? DVT (deep venous thrombosis) ([place])   ? Dyslipidemia   ? Dysrhythmia   ? Eczema   ? Erectile dysfunction 07/11/2012  ? Essential hypertension   ? GERD 08/04/2006  ?    ? GERD (gastroesophageal reflux disease)   ? Hyperglycemia 07/08/2019  ? Hyperlipidemia   ? Hypertension   ? Incomplete emptying of bladder 08/14/2019  ? Long term current use of anticoagulant therapy   ? Lumbar disc disease   ? Overweight (BMI 25.0-29.9) 06/18/2015  ? Persistent atrial fibrillation ([place]) 04/10/2018  ? Personal history of DVT (deep vein thrombosis)   ? Initially in 1998 with recurrence in 2002 now on chronic warfarin   ? Soft tissue lesion of foot 08/27/2015  ? Stroke [facility])   ? Urinary tract infection with hematuria 08/14/2019  ?  ?Family History  ?Problem Relation Age of Onset  ? Heart disease Father   ? Heart attack Father   ? Atrial fibrillation Father   ? Heart disease Mother   ? Atrial fibrillation Mother   ? Stroke Mother   ? AAA (abdominal aortic aneurysm) Brother   ?  ?Past Surgical History:  ?Procedure Laterality Date  ? APPLICATION OF WOUND VAC  09/02/2020  ? Procedure: APPLICATION OF WOUND VAC;  Surgeon: Donnie Mesa, MD;  Location: [facility];  Service: General;;  ? CARDIAC CATHETERIZATION    ? CARPAL TUNNEL RELEASE  04/21/2011  ? Procedure: CARPAL TUNNEL RELEASE;  Surgeon: Tennis Must, MD;  Location: [facility];  Service: Orthopedics;  Laterality: Left;  ? CARPAL TUNNEL RELEASE  05/23/2011  ? Procedure: CARPAL TUNNEL RELEASE;  Surgeon: Tennis Must, MD;  Location: [facility];  Service: Orthopedics;  Laterality: Right;  ? COLON SURGERY  2010 and 2011 colostomy reversal  ? CORONARY ANGIOPLASTY    ? EYE SURGERY    ? repair of macular hole  ? LAPAROTOMY N/A 09/02/2020  ? Procedure: EXPLORATORY LAPAROTOMY, LYSIS OF ADHESIONS ,BOWEL RESECTION;  Surgeon:  Donnie Mesa, MD;  Location: [facility];  Service: General;  Laterality: N/A;  ? LUMBAR LAMINECTOMY/DECOMPRESSION MICRODISCECTOMY Left 03/18/2021  ? Procedure: LAMINOTOMY, FORAMINOTOMY, LEFT LUMBAR THREE-FOUR;  Surgeon: Consuella Lose, MD;  Location: [facility];  Service: Neurosurgery;  Laterality: Left;  ? NASAL SEPTUM SURGERY    ? precutaneous transluminal coronary angioplasty    ? right hernia    ? TONSILLECTOMY AND ADENOIDECTOMY    ? ?Social History  ? ?Occupational History  ? Occupation: [employer]  ?Tobacco Use  ? Smoking status: Former  ?  Packs/day: 1.00  ?  Years: 60.00  ?  Pack years: 60.00  ?  Types: Cigarettes  ?  Quit date: 03/25/1998  ?  Years since quitting: 23.2  ? Smokeless tobacco: Never  ?Vaping Use  ? Vaping Use: Never used  ?Substance and Sexual Activity  ? Alcohol use: Not Currently  ?  Comment: once a month  ? Drug use: No  ? Sexual activity: Not on file  ? ? ? ? ? ? ?

## 2021-06-22 ENCOUNTER — Ambulatory Visit: Payer: Medicare HMO | Admitting: Family Medicine

## 2021-06-22 NOTE — Progress Notes (Signed)
? ? ?Chronic Care Management ?Pharmacy Note ? ?06/30/2021 ?Name:  William Norman MRN:  [id number] DOB:  April 01, 1940 ? ?Summary: ?PharmD FU doing well besides knee pain/back pain. ? ?Recommendations/Changes made from today's visit: ?Monitor BP - consider re initiation of HCTZ if BP remains elevated ?FU with MRI results ? ?Plan: ?FU w me in 6 months or sooner if needed ? ? ?Subjective: ?William Norman is an 81 y.o. year old male who is a primary patient of [name], [name], MD.  The CCM team was consulted for assistance with disease management and care coordination needs.   ? ?Engaged with patient face to face for initial visit in response to provider referral for pharmacy case management and/or care coordination services.  ? ?Consent to Services:  ?The patient was given the following information about Chronic Care Management services today, agreed to services, and gave verbal consent: 1. CCM service includes personalized support from designated clinical staff supervised by the primary care provider, including individualized plan of care and coordination with other care providers 2. 24/7 contact phone numbers for assistance for urgent and routine care needs. 3. Service will only be billed when office clinical staff spend 20 minutes or more in a month to coordinate care. 4. Only one practitioner may furnish and bill the service in a calendar month. 5.The patient may stop CCM services at any time (effective at the end of the month) by phone call to the office staff. 6. The patient will be responsible for cost sharing (co-pay) of up to 20% of the service fee (after annual deductible is met). Patient agreed to services and consent obtained. ? ?Patient Care Team: ?Vivi Barrack, MD as PCP - General (Family Medicine) ?Richardo Priest, MD as PCP - Cardiology (Cardiology) ?Jacolyn Reedy, MD as Consulting Physician (Cardiology) ?Rutherford Guys, MD as Consulting Physician (Ophthalmology) ?Richardo Priest, MD as  Consulting Physician (Cardiology) ?Edrick Kins, DPM as Consulting Physician (Podiatry) ?Lyndee Hensen, PT as Physical Therapist (Physical Therapy) ?Edythe Clarity, [facility] as Pharmacist (Pharmacist) ?Consuella Lose, MD as Consulting Physician (Neurosurgery) ? ?Recent office visits: ?11/20/20 [name]) - working on lifestyle mods for glucose.  Most recent A1c was 5.9 ? ?Recent consult visits: ?11/25/20 [facility], Cardiology) - Volume overload improved, stable on current medication regimen.  Identified with NYHA Class I HF. ? ?11/10/20 [facility], Cardiology) - reports SOB and fluid overload.  Continue to follow, BP is currently controlled. ? ?Hospital visits: ?10/16/20 (ED, SOB) - concern for low O2, EKG unremarkable in ED, he was released to home based on these assessments. ? ? ?Objective: ? ?Lab Results  ?Component Value Date  ? CREATININE 0.80 03/15/2021  ? BUN 17 03/15/2021  ? GFR 78.53 07/20/2020  ? GFRNONAA >60 03/15/2021  ? GFRAA 97 12/03/2019  ? NA 142 03/15/2021  ? K 4.6 03/15/2021  ? CALCIUM 9.6 03/15/2021  ? CO2 29 03/15/2021  ? GLUCOSE 99 03/15/2021  ? ? ?Lab Results  ?Component Value Date/Time  ? HGBA1C 5.9 07/20/2020 02:50 PM  ? HGBA1C 5.5 02/17/2020 04:01 AM  ? GFR 78.53 07/20/2020 02:50 PM  ? GFR 95.88 07/08/2019 02:27 PM  ?  ?Last diabetic Eye exam: No results found for: HMDIABEYEEXA  ?Last diabetic Foot exam: No results found for: HMDIABFOOTEX  ? ?Lab Results  ?Component Value Date  ? CHOL 124 07/20/2020  ? HDL 49.60 07/20/2020  ? [place] 60 07/20/2020  ? LDLDIRECT 131.5 04/22/2010  ? TRIG 54 09/14/2020  ? CHOLHDL 2 07/20/2020  ? ? ? ?  Latest Ref Rng & Units 10/02/2020  ?  4:26 PM 09/17/2020  ? 12:45 PM 09/14/2020  ?  3:53 AM  ?Hepatic Function  ?Total Protein 6.1 - 8.1 g/dL 6.5   6.1   5.5    ?Albumin 3.5 - 5.0 g/dL  2.0   1.8    ?AST 10 - 35 U/L 91   74   107    ?ALT 9 - 46 U/L 79   108   145    ?Alk Phosphatase 38 - 126 U/L  123   137    ?Total Bilirubin 0.2 - 1.2 mg/dL 0.7   1.4   1.1    ? ? ?Lab  Results  ?Component Value Date/Time  ? TSH 3.18 07/20/2020 02:50 PM  ? TSH 2.86 09/30/2019 01:49 PM  ? ? ? ?  Latest Ref Rng & Units 03/15/2021  ?  2:18 PM 10/16/2020  ?  7:21 PM 10/02/2020  ?  4:26 PM  ?CBC  ?WBC 4.0 - 10.5 K/uL 8.8   8.0   7.1    ?Hemoglobin 13.0 - 17.0 g/dL 15.1   11.6   11.6    ?Hematocrit 39.0 - 52.0 % 48.5   37.5   37.0    ?Platelets 150 - 400 K/uL 215   380   397    ? ? ?No results found for: VD25OH ? ?Clinical ASCVD: Yes  ?The ASCVD Risk score (Arnett DK, et al., 2019) failed to calculate for the following reasons: ?  The 2019 ASCVD risk score is only valid for ages 49 to 19 ?  The patient has a prior MI or stroke diagnosis   ? ? ?  10/02/2020  ?  4:29 PM 03/23/2020  ?  1:59 PM 04/12/2019  ?  2:51 PM  ?Depression screen PHQ 2/9  ?Decreased Interest 2 0 0  ?Down, Depressed, Hopeless 1 0 0  ?PHQ - 2 Score 3 0 0  ?Altered sleeping 1    ?Tired, decreased energy 3    ?Change in appetite 2    ?Feeling bad or failure about yourself  1    ?Trouble concentrating 1    ?Moving slowly or fidgety/restless 1    ?Suicidal thoughts 1    ?PHQ-9 Score 13    ?Difficult doing work/chores Somewhat difficult    ?  ? ? ?Social History  ? ?Tobacco Use  ?Smoking Status Former  ? Packs/day: 1.00  ? Years: 60.00  ? Pack years: 60.00  ? Types: Cigarettes  ? Quit date: 03/25/1998  ? Years since quitting: 23.2  ?Smokeless Tobacco Never  ? ?BP Readings from Last 3 Encounters:  ?06/23/21 (!) 142/92  ?05/27/21 (!) 142/81  ?05/03/21 (!) 145/85  ? ?Pulse Readings from Last 3 Encounters:  ?06/23/21 84  ?05/27/21 78  ?05/03/21 97  ? ?Wt Readings from Last 3 Encounters:  ?06/23/21 218 lb (98.9 kg)  ?05/03/21 208 lb 8 oz (94.6 kg)  ?04/19/21 212 lb 3.2 oz (96.3 kg)  ? ?BMI Readings from Last 3 Encounters:  ?06/23/21 29.57 kg/m?  ?05/03/21 28.28 kg/m?  ?04/19/21 28.78 kg/m?  ? ? ?Assessment/Interventions: Review of patient past medical history, allergies, medications, health status, including review of consultants reports, laboratory and  other test data, was performed as part of comprehensive evaluation and provision of chronic care management services.  ? ?SDOH:  (Social Determinants of Health) assessments and interventions performed: Yes ? ?Financial Resource Strain: Low Risk   ? Difficulty of Paying Living Expenses: Not  very hard  ? ? ?SDOH Screenings  ? ?Alcohol Screen: Not on file  ?Depression (PHQ2-9): Medium Risk  ? PHQ-2 Score: 13  ?Financial Resource Strain: Low Risk   ? Difficulty of Paying Living Expenses: Not very hard  ?Food Insecurity: Not on file  ?Housing: Not on file  ?Physical Activity: Not on file  ?Social Connections: Not on file  ?Stress: Not on file  ?Tobacco Use: Medium Risk  ? Smoking Tobacco Use: Former  ? Smokeless Tobacco Use: Never  ? Passive Exposure: Not on file  ?Transportation Needs: Not on file  ? ? ?[place] ? ?Allergies  ?Allergen Reactions  ? Other Other (See Comments)  ?  Some form of numbing medication used by dentist, almost died  ? Simvastatin Other (See Comments)  ?  Muscle aches ?  ? ? ?Medications Reviewed Today   ? ? Reviewed by Edythe Clarity, RPH (Pharmacist) on 06/30/21 at [phone]  Med List Status: <None>  ? ?Medication Order Taking? Sig Documenting Provider Last Dose Status Informant  ?acetaminophen (ACETAMINOPHEN 8 HOUR) 650 MG CR tablet [id number] Yes Take 1 tablet (650 mg total) by mouth every 8 (eight) hours as needed for pain. Vivi Barrack, MD Taking Active Self  ?apixaban (ELIQUIS) 5 MG TABS tablet [id number] Yes Take 1 tablet (5 mg total) by mouth 2 (two) times daily. Consuella Lose, MD Taking Active   ?atenolol (TENORMIN) 25 MG tablet [id number] Yes Take 1 tablet (25 mg total) by mouth daily. Vivi Barrack, MD Taking Active Self  ?atorvastatin (LIPITOR) 40 MG tablet [id number] Yes TAKE 1/2 TABLET BY MOUTH DAILY Vivi Barrack, MD Taking Active Self  ?Coenzyme Q10 (CO Q-10) 300 MG CAPS [id number] Yes Take 300 mg by mouth daily. [provider] Taking Active Self   ?Cyanocobalamin (VITAMIN B-12) 1000 MCG SUBL [id number] Yes Place 1 tablet (1,000 mcg total) under the tongue daily. Vivi Barrack, MD Taking Active Self  ?furosemide (LASIX) 20 MG tablet [id number]  Take 1 tablet

## 2021-06-22 NOTE — Progress Notes (Deleted)
   I, Peterson Lombard, LAT, ATC acting as a scribe for Lynne Leader, MD.  William Norman is a 81 y.o. male who presents to [place] at [facility] today for continued back and L leg pain. Pt was last seen by Dr. Georgina Snell on 04/19/21 for lumbar radiculopathy w/ L foot drop. Pt had a L laminotomy, foraminotomy at L3-4 on 03/18/21 by Dr. Kathyrn Sheriff. Pt saw Dr. Erlinda Hong on 06/17/21 and was given a L knee steroid injection and referred to PT. Pt has had prior trial of ESI on 01/25/21 and 01/11/21.  Dx imaging: 03/18/21 L-spine XR 12/20/20 L-spine MRI             02/26/20 L-spine XR             02/15/07 L-spine XR  Pertinent review of systems: ***  Relevant historical information: ***   Exam:  There were no vitals taken for this visit. General: Well Developed, well nourished, and in no acute distress.   MSK: ***    Lab and Radiology Results No results found for this or any previous visit (from the past 72 hour(s)). No results found.     Assessment and Plan: 81 y.o. male with ***   PDMP not reviewed this encounter. No orders of the defined types were placed in this encounter.  No orders of the defined types were placed in this encounter.    Discussed warning signs or symptoms. Please see discharge instructions. Patient expresses understanding.   ***

## 2021-06-23 ENCOUNTER — Ambulatory Visit: Payer: Medicare HMO | Admitting: Family Medicine

## 2021-06-23 VITALS — BP 142/92 | HR 84 | Ht 72.0 in | Wt 218.0 lb

## 2021-06-23 DIAGNOSIS — M5416 Radiculopathy, lumbar region: Secondary | ICD-10-CM

## 2021-06-23 NOTE — Progress Notes (Signed)
? ?I, Peterson Lombard, LAT, ATC acting as a scribe for Lynne Leader, MD. ? ?William Norman is a 81 y.o. male who presents to [place] at [facility] today for continued back and L leg pain. Pt was last seen by Dr. Georgina Snell on 04/19/21 for lumbar radiculopathy w/ L foot drop. Pt had a L laminotomy, foraminotomy at L3-4 on 03/18/21 by Dr. Kathyrn Sheriff. Pt saw Dr. Erlinda Hong on 06/17/21 and was given a L knee steroid injection and referred to PT. Pt has had prior trial of ESI on 01/25/21 and 01/11/21. Today, pt reports each day his pain worsens. Pt reports no relief from steroid injection. Pt expresses displeasure that other providers have not spent enough time w/ pt. Pt is at a point where he doesn't know where to go or who to see to help him. Pt locates pain to the L side of his low pain, when walking pain will shift to the R side. Pt is having gait disturbances, balance problems, and feeling like his legs are going to "give out." Pt co weakness in L leg.   ? ?He notes significant weakness in his left leg. Weakness to left hip flexion and left knee extension. He has almost fallen multiple times. His weakness developed/worsened recently.  ? ?Dx imaging: 03/18/21 L-spine XR ?12/20/20 L-spine MRI ?            02/26/20 L-spine XR ?            02/15/07 L-spine XR ? ? ?Pertinent review of systems: No fevers or chills ? ?Relevant historical information: Memory change.  ? ? ?Exam:  ?BP (!) 142/92   Pulse 84   Ht 6' (1.829 m)   Wt 218 lb (98.9 kg)   SpO2 95%   BMI 29.57 kg/m?  ?General: Well Developed, well nourished, and in no acute distress.  ? ?MSK: L-spine: Nontender midline.  Decreased lumbar motion. ?Significant decrease strength to left hip flexion and knee extension 3/5. ?Decreased strength left foot dorsiflexion 3/5.  Otherwise lower extremity strength is intact bilaterally. ? ? ? ?Lab and Radiology Results ?No results found for this or any previous visit (from the past 72 hour(s)). ?No results  found. ? ? ? ? ?Assessment and Plan: ?81 y.o. male with significant new weakness to hip flexion left leg.  He already has existing weakness to dorsiflexion.  Initially today is the hip flexor and knee extensor weakness.  Plan for MRI to figure this out more accurately and plan for epidural steroid injection or surgery.  Additionally refer to physical therapy. ? ? ?PDMP not reviewed this encounter. ?Orders Placed This Encounter  ?Procedures  ? MR LUMBAR SPINE WO CONTRAST  ?  Standing Status:   Future  ?  Standing Expiration Date:   07/24/2021  ?  Order Specific Question:   What is the patient's sedation requirement?  ?  Answer:   No Sedation  ?  Order Specific Question:   Does the patient have a pacemaker or implanted devices?  ?  Answer:   No  ?  Order Specific Question:   Preferred imaging location?  ?  Answer:   Product/process development scientist (table limit-350lbs)  ? Ambulatory referral to Physical Therapy  ?  Referral Priority:   Routine  ?  Referral Type:   Physical Medicine  ?  Referral Reason:   Specialty Services Required  ?  Requested Specialty:   Physical Therapy  ?  Number of Visits Requested:   1  ? ?  No orders of the defined types were placed in this encounter. ? ? ? ?Discussed warning signs or symptoms. Please see discharge instructions. Patient expresses understanding. ? ? ?The above documentation has been reviewed and is accurate and complete Lynne Leader, M.D. ? ? ?

## 2021-06-23 NOTE — Patient Instructions (Addendum)
Thank you for coming in today.  ? ?You should hear from MRI scheduling within 1 week. If you do not hear please let me know.   ? ?I've referred you to Physical Therapy.  Let us know if you don't hear from them in one week.  ? ?Recheck after MRI ?

## 2021-06-28 ENCOUNTER — Ambulatory Visit: Payer: Medicare HMO | Admitting: Physical Therapy

## 2021-06-28 DIAGNOSIS — R2689 Other abnormalities of gait and mobility: Secondary | ICD-10-CM | POA: Diagnosis not present

## 2021-06-28 DIAGNOSIS — M6281 Muscle weakness (generalized): Secondary | ICD-10-CM | POA: Diagnosis not present

## 2021-06-28 DIAGNOSIS — M5459 Other low back pain: Secondary | ICD-10-CM

## 2021-06-28 NOTE — Therapy (Signed)
?OUTPATIENT PHYSICAL THERAPY LUMBAR EVALUATION ? ? ?Patient Name: William Norman ?MRN: [id number] ?DOB:1940/03/02, 81 y.o., male ?Today's Date: 06/28/2021 ? ? PT End of Session - 06/29/21 2132   ? ? Visit Number 1   ? Number of Visits 12   ? Date for PT Re-Evaluation 08/09/21   ? Authorization Type Aetna Medicare   ? PT Start Time 1430   ? PT Stop Time 1515   ? PT Time Calculation (min) 45 min   ? Activity Tolerance Patient tolerated treatment well   ? Behavior During Therapy [facility] for tasks assessed/performed   ? ?  ?  ? ?  ? ? ?Past Medical History:  ?Diagnosis Date  ? BPH (benign prostatic hypertrophy)   ? BPH associated with nocturia 08/29/2013  ? BPH with obstruction/lower urinary tract symptoms 08/14/2019  ? CAD (coronary artery disease)   ? CAD (coronary artery disease), native coronary artery   ? PTCA of RCA 1986, PTCA of LAD 1992,  Negative treadmill Cardiolite 2011   ? Chronic dermatitis of hands 10/12/2015  ? Chronic venous insufficiency   ? Coagulation disorder ([place]) 10/26/2018  ? DVT (deep venous thrombosis) ([place])   ? Dyslipidemia   ? Dysrhythmia   ? Eczema   ? Erectile dysfunction 07/11/2012  ? Essential hypertension   ? GERD 08/04/2006  ?    ? GERD (gastroesophageal reflux disease)   ? Hyperglycemia 07/08/2019  ? Hyperlipidemia   ? Hypertension   ? Incomplete emptying of bladder 08/14/2019  ? Long term current use of anticoagulant therapy   ? Lumbar disc disease   ? Overweight (BMI 25.0-29.9) 06/18/2015  ? Persistent atrial fibrillation ([place]) 04/10/2018  ? Personal history of DVT (deep vein thrombosis)   ? Initially in 1998 with recurrence in 2002 now on chronic warfarin   ? Soft tissue lesion of foot 08/27/2015  ? Stroke [facility])   ? Urinary tract infection with hematuria 08/14/2019  ? ?Past Surgical History:  ?Procedure Laterality Date  ? APPLICATION OF WOUND VAC  09/02/2020  ? Procedure: APPLICATION OF WOUND VAC;  Surgeon: Donnie Mesa, MD;  Location: [place];  Service: General;;  ? CARDIAC  CATHETERIZATION    ? CARPAL TUNNEL RELEASE  04/21/2011  ? Procedure: CARPAL TUNNEL RELEASE;  Surgeon: Tennis Must, MD;  Location: [place];  Service: Orthopedics;  Laterality: Left;  ? CARPAL TUNNEL RELEASE  05/23/2011  ? Procedure: CARPAL TUNNEL RELEASE;  Surgeon: Tennis Must, MD;  Location: [place];  Service: Orthopedics;  Laterality: Right;  ? COLON SURGERY  2010 and 2011 colostomy reversal  ? CORONARY ANGIOPLASTY    ? EYE SURGERY    ? repair of macular hole  ? LAPAROTOMY N/A 09/02/2020  ? Procedure: EXPLORATORY LAPAROTOMY, LYSIS OF ADHESIONS ,BOWEL RESECTION;  Surgeon: Donnie Mesa, MD;  Location: [place];  Service: General;  Laterality: N/A;  ? LUMBAR LAMINECTOMY/DECOMPRESSION MICRODISCECTOMY Left 03/18/2021  ? Procedure: LAMINOTOMY, FORAMINOTOMY, LEFT LUMBAR THREE-FOUR;  Surgeon: Consuella Lose, MD;  Location: [place];  Service: Neurosurgery;  Laterality: Left;  ? NASAL SEPTUM SURGERY    ? precutaneous transluminal coronary angioplasty    ? right hernia    ? TONSILLECTOMY AND ADENOIDECTOMY    ? ?Patient Active Problem List  ? Diagnosis Date Noted  ? Lumbar spinal stenosis 03/18/2021  ? Osteoarthritis 11/20/2020  ? Constipation 10/02/2020  ? Protein-calorie malnutrition, severe 09/03/2020  ? SBO (small bowel obstruction) ([place]) 08/28/2020  ? AAA (abdominal aortic aneurysm) ([place]) 03/06/2020  ?  Porokeratosis 02/25/2020  ? History of ischemic stroke 02/16/2020  ? Eczema   ? DVT (deep venous thrombosis) ([place])   ? CAD (coronary artery disease)   ? Recurrent UTI 08/14/2019  ? Hyperglycemia 07/08/2019  ? Persistent atrial fibrillation ([place]) 04/10/2018  ? Chronic dermatitis of hands 10/12/2015  ? Soft tissue lesion of foot 08/27/2015  ? Stroke ([place]) 06/18/2015  ? BPH associated with nocturia 08/29/2013  ? CAD (coronary artery disease), native coronary artery   ? Long term current use of anticoagulant therapy   ? Personal history of DVT (deep vein thrombosis)   ? Chronic venous  insufficiency   ? Erectile dysfunction 07/11/2012  ? Lumbar disc disease   ? GERD 08/04/2006  ? Dyslipidemia   ? Essential hypertension   ? ? ?PCP: Dimas Chyle ? ?REFERRING PROVIDER: Lynne Leader ? ?REFERRING DIAG: Lumbar Radiculopathy ? ?THERAPY DIAG:  ?Other low back pain ? ?Muscle weakness (generalized) ? ?Other abnormalities of gait and mobility ? ?ONSET DATE:  ? ?SUBJECTIVE:                                                                                                                                                                                          ? ?SUBJECTIVE STATEMENT: ?Pt reports ongoing pain and difficulty with back and legs. He had surgery in feb 2022, states minimal relief. He continues to have bothersome pain in back, glutes, and LEs. He notes worsening weakness in legs.  ?He does have SPC, not using all the time. He did have one recent fall where his leg gave out on him.  ?Taking meds for pain/ tylenol, only getting some relief ?Pain in L low back,  ?First think in AM pain is the worst, R>L,   L foot drop, Radicular pain in L leg into toes.  ?Also has pain in L knee, with weakness. Did have injection recently. States sometimes has no pain, but has sharp pains at times with turning and walking,  and feels like its going to give way.  ?Full flight of stairs at home to bedroom,  ? ? ?PERTINENT HISTORY:  ?Stroke, CAD, A-fib, Previous back surgery Feb 2022 ? ?PAIN:  ?Are you having pain? Yes: NPRS scale: 7-8 /10 ?Pain location: Back, into glutes, hips, legs  ?Pain description: radiating  ?Aggravating factors: standing, walking,  ?Relieving factors: sitting  ? ?Are you having pain? Yes: NPRS scale: 8/10 ?Pain location: L knee  ?Pain description: Weakness, feels like its doing to give out, has had fall from this.  ?Aggravating factors: Only if he turns wrong on it.  ?Relieving factors: none  ? ? ?PRECAUTIONS: Fall ? ? ?  FALLS:  ?Has patient fallen in last 6 months? Yes. Number of falls 1 ?L knee gave  out, was able to get himself up, no injury  ? ?PLOF: Independent ? ?PATIENT GOALS   Decreased pain, leg strength.  ? ? ?OBJECTIVE:  ? ?DIAGNOSTIC FINDINGS: states he had L knee x-ray, do not see results.  ? ?COGNITION: ? Overall cognitive status: Within functional limits for tasks assessed   ? ? ?PALPATION: ? ? ?LUMBAR ROM:  ?Hip ROM: Mild limitation for flex and rotation ? ?Active  AROM  ?06/28/2021  ?Flexion Mod/sig limitation  ?Extension Sig limitation  ?Right lateral flexion Mod limitation  ?Left lateral flexion Mod limitation   ?Right rotation   ?Left rotation   ? (Blank rows = not tested) ? ? ?LE MMT: ? Hips: 4/5,  ? ?GAIT: ?Distance walked: 100 ?Assistive device utilized: Single point cane and None ?Level of assistance: Modified independence ?Comments: foot drop on L, causing increased hip flexion with swing , inability for heel strike with initial contact.  ? ? ? ?TODAY'S TREATMENT  ? ? ? ?PATIENT EDUCATION:  ?Education details: PT POC, Exam findings,  ?Person educated: Patient ?Education method: Explanation, Demonstration, Tactile cues, Verbal cues, and Handouts ?Education comprehension: verbalized understanding, returned demonstration, verbal cues required, tactile cues required, and needs further education ? ? ?HOME EXERCISE PROGRAM: ? ? ?ASSESSMENT: ? ?CLINICAL IMPRESSION: ?Pt presents with primary complaint of ongoing pain and deficits in back. He has had minimal relief from surgery and injections, and continues to have pain into bil glutes and LEs. He has severe weakness noted in L quad as well as L DF, which is causing foot drop. He has poor gait pattern due to foot drop, and may benefit from evaluation for AFO. He has decreased ability and tolerance for standing and walking activity, due to pain and weakness. He has had 1 recent fall from leg giving out on him. Pt to benefit from skilled PT to improve deficits and pain.   ? ?OBJECTIVE IMPAIRMENTS Abnormal gait, decreased activity tolerance, decreased  balance, decreased coordination, decreased endurance, decreased knowledge of use of DME, decreased mobility, difficulty walking, decreased ROM, decreased strength, decreased safety awareness, improper body mechanics,

## 2021-06-29 ENCOUNTER — Encounter: Payer: Self-pay | Admitting: Physical Therapy

## 2021-06-29 ENCOUNTER — Ambulatory Visit: Payer: Medicare HMO | Admitting: Pharmacist

## 2021-06-29 DIAGNOSIS — E785 Hyperlipidemia, unspecified: Secondary | ICD-10-CM

## 2021-06-29 DIAGNOSIS — I10 Essential (primary) hypertension: Secondary | ICD-10-CM

## 2021-06-30 ENCOUNTER — Encounter: Payer: Self-pay | Admitting: Physical Therapy

## 2021-06-30 ENCOUNTER — Telehealth: Payer: Self-pay | Admitting: Orthopaedic Surgery

## 2021-06-30 ENCOUNTER — Ambulatory Visit: Payer: Medicare HMO | Admitting: Physical Therapy

## 2021-06-30 DIAGNOSIS — M5459 Other low back pain: Secondary | ICD-10-CM

## 2021-06-30 DIAGNOSIS — R2689 Other abnormalities of gait and mobility: Secondary | ICD-10-CM | POA: Diagnosis not present

## 2021-06-30 DIAGNOSIS — M6281 Muscle weakness (generalized): Secondary | ICD-10-CM | POA: Diagnosis not present

## 2021-06-30 NOTE — Therapy (Signed)
OUTPATIENT PHYSICAL THERAPY LUMBAR EVALUATION   Patient Name: William Norman MRN: [id number] DOB:31-Jan-1941, 81 y.o., male Today's Date: 06/30/2021   PT End of Session - 06/30/21 1338     Visit Number 2    Number of Visits 12    Date for PT Re-Evaluation 08/09/21    Authorization Type Aetna Medicare    PT Start Time 1303    PT Stop Time 2637    PT Time Calculation (min) 40 min    Activity Tolerance Patient tolerated treatment well    Behavior During Therapy [facility] for tasks assessed/performed              Past Medical History:  Diagnosis Date   BPH (benign prostatic hypertrophy)    BPH associated with nocturia 08/29/2013   BPH with obstruction/lower urinary tract symptoms 08/14/2019   CAD (coronary artery disease)    CAD (coronary artery disease), native coronary artery    PTCA of RCA 1986, PTCA of LAD 1992,  Negative treadmill Cardiolite 2011    Chronic dermatitis of hands 10/12/2015   Chronic venous insufficiency    Coagulation disorder ([place]) 10/26/2018   DVT (deep venous thrombosis) ([place])    Dyslipidemia    Dysrhythmia    Eczema    Erectile dysfunction 07/11/2012   Essential hypertension    GERD 08/04/2006       GERD (gastroesophageal reflux disease)    Hyperglycemia 07/08/2019   Hyperlipidemia    Hypertension    Incomplete emptying of bladder 08/14/2019   Long term current use of anticoagulant therapy    Lumbar disc disease    Overweight (BMI 25.0-29.9) 06/18/2015   Persistent atrial fibrillation ([place]) 04/10/2018   Personal history of DVT (deep vein thrombosis)    Initially in 1998 with recurrence in 2002 now on chronic warfarin    Soft tissue lesion of foot 08/27/2015   Stroke [facility])    Urinary tract infection with hematuria 08/14/2019   Past Surgical History:  Procedure Laterality Date   APPLICATION OF WOUND VAC  09/02/2020   Procedure: APPLICATION OF WOUND VAC;  Surgeon: Donnie Mesa, MD;  Location: [place];  Service: General;;   CARDIAC  CATHETERIZATION     CARPAL TUNNEL RELEASE  04/21/2011   Procedure: CARPAL TUNNEL RELEASE;  Surgeon: Tennis Must, MD;  Location: [place];  Service: Orthopedics;  Laterality: Left;   CARPAL TUNNEL RELEASE  05/23/2011   Procedure: CARPAL TUNNEL RELEASE;  Surgeon: Tennis Must, MD;  Location: [place];  Service: Orthopedics;  Laterality: Right;   COLON SURGERY  2010 and 2011 colostomy reversal   CORONARY ANGIOPLASTY     EYE SURGERY     repair of macular hole   LAPAROTOMY N/A 09/02/2020   Procedure: EXPLORATORY LAPAROTOMY, LYSIS OF ADHESIONS ,BOWEL RESECTION;  Surgeon: Donnie Mesa, MD;  Location: [place];  Service: General;  Laterality: N/A;   LUMBAR LAMINECTOMY/DECOMPRESSION MICRODISCECTOMY Left 03/18/2021   Procedure: LAMINOTOMY, FORAMINOTOMY, LEFT LUMBAR THREE-FOUR;  Surgeon: Consuella Lose, MD;  Location: [place];  Service: Neurosurgery;  Laterality: Left;   NASAL SEPTUM SURGERY     precutaneous transluminal coronary angioplasty     right hernia     TONSILLECTOMY AND ADENOIDECTOMY     Patient Active Problem List   Diagnosis Date Noted   Lumbar spinal stenosis 03/18/2021   Osteoarthritis 11/20/2020   Constipation 10/02/2020   Protein-calorie malnutrition, severe 09/03/2020   SBO (small bowel obstruction) ([place]) 08/28/2020   AAA (abdominal aortic aneurysm) ([place])  03/06/2020   Porokeratosis 02/25/2020   History of ischemic stroke 02/16/2020   Eczema    DVT (deep venous thrombosis) ([facility])    CAD (coronary artery disease)    Recurrent UTI 08/14/2019   Hyperglycemia 07/08/2019   Persistent atrial fibrillation ([place]) 04/10/2018   Chronic dermatitis of hands 10/12/2015   Soft tissue lesion of foot 08/27/2015   Stroke ([place]) 06/18/2015   BPH associated with nocturia 08/29/2013   CAD (coronary artery disease), native coronary artery    Long term current use of anticoagulant therapy    Personal history of DVT (deep vein thrombosis)    Chronic venous  insufficiency    Erectile dysfunction 07/11/2012   Lumbar disc disease    GERD 08/04/2006   Dyslipidemia    Essential hypertension     PCP: Dimas Chyle  REFERRING PROVIDER: Lynne Leader  REFERRING DIAG: Lumbar Radiculopathy  THERAPY DIAG:  Other low back pain  Muscle weakness (generalized)  Other abnormalities of gait and mobility  ONSET DATE:   SUBJECTIVE:                                                                                                                                                                                           SUBJECTIVE STATEMENT: Pt states discomfort in anterior leg/shin today, tender. Also states ongoing pain in knee, weakness in leg, and pain in back/legs.   Eval: Pt reports ongoing pain and difficulty with back and legs. He had surgery in feb 2022, states minimal relief. He continues to have bothersome pain in back, glutes, and LEs. He notes worsening weakness in legs.  He does have SPC, not using all the time. He did have one recent fall where his leg gave out on him.  Taking meds for pain/ tylenol, only getting some relief Pain in L low back,  First think in AM pain is the worst, R>L,   L foot drop, Radicular pain in L leg into toes.  Also has pain in L knee, with weakness. Did have injection recently. States sometimes has no pain, but has sharp pains at times with turning and walking,  and feels like its going to give way.  Full flight of stairs at home to bedroom,    PERTINENT HISTORY:  Stroke, CAD, A-fib, Previous back surgery Feb 2022  PAIN:  Are you having pain? Yes: NPRS scale: 7-8 /10 Pain location: Back, into glutes, hips, legs  Pain description: radiating  Aggravating factors: standing, walking,  Relieving factors: sitting   Are you having pain? Yes: NPRS scale: 8/10 Pain location: L knee  Pain description: Weakness, feels like its doing to give  out, has had fall from this.  Aggravating factors: Only if he turns wrong on  it.  Relieving factors: none    PRECAUTIONS: Fall   FALLS:  Has patient fallen in last 6 months? Yes. Number of falls 1 L knee gave out, was able to get himself up, no injury   PLOF: Independent  PATIENT GOALS   Decreased pain, leg strength.    OBJECTIVE:   DIAGNOSTIC FINDINGS: states he had L knee x-ray, do not see results.   COGNITION:  Overall cognitive status: Within functional limits for tasks assessed     PALPATION:   LUMBAR ROM:  Hip ROM: Mild limitation for flex and rotation  Active  AROM  06/28/2021  Flexion Mod/sig limitation  Extension Sig limitation  Right lateral flexion Mod limitation  Left lateral flexion Mod limitation   Right rotation   Left rotation    (Blank rows = not tested)   LE MMT:  Hips: 4/5,   GAIT: Distance walked: 100 Assistive device utilized: Single point cane and None Level of assistance: Modified independence Comments: foot drop on L, causing increased hip flexion with swing , inability for heel strike with initial contact.     TODAY'S TREATMENT   06/30/21: Therapeutic Exercise:  Aerobic: Supine: Heel slides for L knee mobility x 15 on :  Quad sets x 15 on L; Supine march x 15 ;  Seated: attempted LAQ and SAQ (painful);  Standing: Stretches: pelvic tilts x15;  SKTC 30 sec x 3 bil;   Neuromuscular Re-education: Manual Therapy: long leg distraction for bil LE, for lumbar pump;  Self Care:    PATIENT EDUCATION:  Education details: issued HEP Person educated: Patient Education method: Explanation, Demonstration, Tactile cues, Verbal cues, and Handouts Education comprehension: verbalized understanding, returned demonstration, verbal cues required, tactile cues required, and needs further education   HOME EXERCISE PROGRAM: Access Code: [email or web address] URL: [email or web address] Date: 07/01/2021 Prepared by: Lyndee Hensen  Exercises - Supine Quadricep Sets  - 1- 2 x daily - 1-2 sets - 10 reps - Supine  Heel Slide  - 1 x daily - 1- 2 sets - 10 reps - Supine March  - 1 x daily - 1- 2 sets - 10 reps - Supine Posterior Pelvic Tilt  - 1-2 x daily - 1- 2 sets - 10 reps - Supine Single Knee to Chest Stretch  - 2 x daily - 3 reps - 30 hold   ASSESSMENT:  CLINICAL IMPRESSION:  06/30/21:  Pt with tenderness with light touch to L lower leg/shin today. Recommended he let MD know if it does not improve in another day or so. He does not have any tenderness in calf. Trial for Quad strengthening today, pt able to do quad set, but unable to do LAQ or SAQ due to severe pain in knee. Education for initial HEP for lumbar ROM and flexion stretches today. Plan to progress LE strength as tolerated. Recommended pt continue to use SPC at all times.    Eval: Pt presents with primary complaint of ongoing pain and deficits in back. He has had minimal relief from surgery and injections, and continues to have pain into bil glutes and LEs. He has severe weakness noted in L quad as well as L DF, which is causing foot drop. He has poor gait pattern due to foot drop, and may benefit from evaluation for AFO. He has decreased ability and tolerance for standing and walking activity, due to pain and weakness. He has had 1  recent fall from leg giving out on him. Pt to benefit from skilled PT to improve deficits and pain.    OBJECTIVE IMPAIRMENTS Abnormal gait, decreased activity tolerance, decreased balance, decreased coordination, decreased endurance, decreased knowledge of use of DME, decreased mobility, difficulty walking, decreased ROM, decreased strength, decreased safety awareness, improper body mechanics, and pain.   ACTIVITY LIMITATIONS cleaning, community activity, driving, yard work, and shopping.   PERSONAL FACTORS 1-2 comorbidities: stroke, A-fib,  back surgery,   are also affecting patient's functional outcome.    REHAB POTENTIAL: Fair    CLINICAL DECISION MAKING: Evolving/moderate complexity  EVALUATION  COMPLEXITY: Moderate   GOALS: Goals reviewed with patient? Yes  SHORT TERM GOALS: Target date: 07/12/21   Pt to be independent with initial HEP  Goal status: INITIAL     LONG TERM GOALS: Target date: 08/09/2021    Pt to be independent with final HEP  Goal status: INITIAL  2.  Pt to demo improved strength of L knee and hip to at least 4/5 for extension   Goal status: INITIAL  3.  Pt to report decreased pain in back and LEs to 3/10 with activity.   Goal status: INITIAL   4.  Pt to demo ability for safe ambulation up to .25 mi, to improve ability for community activity and shopping.   Goal status: INITIAL     PLAN: PT FREQUENCY: 2x/week  PT DURATION: 6 weeks  PLANNED INTERVENTIONS: Therapeutic exercises, Therapeutic activity, Neuromuscular re-education, Balance training, Gait training, Patient/Family education, Joint manipulation, Joint mobilization, Stair training, DME instructions, Dry Needling, Electrical stimulation, Spinal manipulation, Spinal mobilization, Cryotherapy, Moist heat, Taping, Traction, Ionotophoresis '4mg'$ /ml Dexamethasone, and Manual therapy.  PLAN FOR NEXT SESSION: issue HEP, teach flexion stretches, lumbar distraction, LE strength, quad and hip strength.   Lyndee Hensen, PT, DPT 10:13 AM  07/01/21

## 2021-06-30 NOTE — Patient Instructions (Addendum)
Visit Information   Goals Addressed   None    Patient Care Plan: General Pharmacy (Adult)     Problem Identified: HTN, CAD, Afib, GERD, HLD, BPH, Hyperglycemia   Priority: High  Onset Date: 12/22/2020     Long-Range Goal: Patient-Specific Goal   Start Date: 12/22/2020  Expected End Date: 06/22/2021  Recent Progress: On track  Priority: High  Note:   Current Barriers:  Possible elevated BP  Pharmacist Clinical Goal(s):  Patient will achieve control of BP as evidenced by monitoring through collaboration with PharmD and provider.   Interventions: 1:1 collaboration with Parker, Caleb M, MD regarding development and update of comprehensive plan of care as evidenced by provider attestation and co-signature Inter-disciplinary care team collaboration (see longitudinal plan of care) Comprehensive medication review performed; medication list updated in electronic medical record  Hypertension (BP goal <130/80) -Controlled -Current treatment: Atenolol 25mg Appropriate, Effective, Safe, Accessible -Medications previously tried: HCTZ (d/c)  -Current home readings: not checking at home, last few office BP elevated -Current exercise habits: minimal -Denies hypotensive/hypertensive symptoms -Educated on BP goals and benefits of medications for prevention of heart attack, stroke and kidney damage; Daily salt intake goal < 2300 mg; Importance of home blood pressure monitoring; -Counseled to monitor BP at home as able, document, and provide log at future appointments -Recommend to keep an eye on BP - he reports recently d/c HCTZ and decreased his atenolol dose.  He has been in pain which could be also contributing to increased BP. -Recommended to continue current medication Monitor BP - could consider addition of HCTZ back if BP continues to be elevated.  Update 06/29/21 BP reviewed - he reports has been "good" Is still dealing with back/leg pain which could be contributing to  elevations. He has MRI upcoming of his back - is also doing PT. Denies any dizziness or HA - no need for changes at this time. Continue to monitor, will have CMA check in periodically on BP/symptoms.  Hyperlipidemia: (LDL goal < 70) -Controlled -Current treatment: Atorvastatin 40mg daily Appropriate, Effective, Safe, Accessible -Medications previously tried: none noted  -Most recent LDL is controlled at 60!  -Educated on Cholesterol goals;  Benefits of statin for ASCVD risk reduction; Importance of limiting foods high in cholesterol; Exercise goal of 150 minutes per week; -Recommended to continue current medication Continue routine screenings.  Update 06/29/21 Continues adherence with statin. Denies adverse effects LDL well controlled, last labs from June 2022. Continue current meds - due for fasting labs at next OV.  Atrial Fibrillation (Goal: prevent stroke and major bleeding) -Controlled -CHADSVASC: 6 -Current treatment: Rate control: Atenolol 25mg daily Anticoagulation: Eliquis 5mg BID -Medications previously tried: none noted -Home BP and HR readings: not checking at home, elevated in office  -Counseled on increased risk of stroke due to Afib and benefits of anticoagulation for stroke prevention; bleeding risk associated with Eliquis and importance of self-monitoring for signs/symptoms of bleeding; -Does not feel rapid HR - no chest pain, denies abnormal bleeding or bruising -Recommended to continue current medication  Hyperglycemia (Goal: Control A1c) -Controlled -Current treatment  None noted -Medications previously tried: none noted -Counseled on meaning of most recent A1c of 5.9 being in the lower end of the pre-diabetes range.  Just something to monitor for now.  -Counseled on diet and exercise extensively Recommended he try and watch carbs and excess sugars in his diet.  Explained sources of carbohydrates in the diet. Continue current management for now -  routine A1c screenings.  BPH (  Goal: Minimize symptoms) -Controlled -Current treatment  None noted -Medications previously tried: alfuzosin  -Continues to report nocturia, getting up 2-3 times per evening to use the restroom.  He reports this is manageable currently. -Recommended continue current management for now, contact providers if symptoms worsen.  Patient Goals/Self-Care Activities Patient will:  - focus on medication adherence by pill counts check blood pressure periodically, document, and provide at future appointments  Follow Up Plan: The care management team will reach out to the patient again over the next 180 days.           The patient verbalized understanding of instructions, educational materials, and care plan provided today and DECLINED offer to receive copy of patient instructions, educational materials, and care plan.  Telephone follow up appointment with pharmacy team member scheduled for: 6 months  Demorio Seeley L Srija Southard, RPH  Legna Mausolf, PharmD Clinical Pharmacist  [facility] [phone]  

## 2021-06-30 NOTE — Telephone Encounter (Signed)
Patient came in today to drop off X-Ray he got at [facility]. That Dr.Xu requested, will be in basket up front with Xu name on it. Would like it to be available to [facility] being he is also seeing pain management clinic within [facility]. ?

## 2021-07-01 ENCOUNTER — Encounter: Payer: Self-pay | Admitting: Physical Therapy

## 2021-07-01 NOTE — Telephone Encounter (Signed)
Are we able to put this in system?

## 2021-07-02 NOTE — Telephone Encounter (Signed)
Spoke with patient after talking with [facility]. We cannot upload this disc into the PACS systems with emailing the images to [phone]. Since they are on a disc, it cannot be faxed.  Patient aware and will come pick up disc from front desk. He will keep disc with him for future appointments here and elsewhere.

## 2021-07-06 ENCOUNTER — Encounter: Payer: Self-pay | Admitting: Family Medicine

## 2021-07-06 ENCOUNTER — Ambulatory Visit (INDEPENDENT_AMBULATORY_CARE_PROVIDER_SITE_OTHER): Payer: Medicare HMO | Admitting: Family Medicine

## 2021-07-06 VITALS — BP 139/88 | HR 77 | Temp 97.9°F | Ht 72.0 in | Wt 213.4 lb

## 2021-07-06 DIAGNOSIS — M5416 Radiculopathy, lumbar region: Secondary | ICD-10-CM

## 2021-07-06 DIAGNOSIS — I25118 Atherosclerotic heart disease of native coronary artery with other forms of angina pectoris: Secondary | ICD-10-CM | POA: Diagnosis not present

## 2021-07-06 DIAGNOSIS — I10 Essential (primary) hypertension: Secondary | ICD-10-CM | POA: Diagnosis not present

## 2021-07-06 DIAGNOSIS — M48061 Spinal stenosis, lumbar region without neurogenic claudication: Secondary | ICD-10-CM

## 2021-07-06 NOTE — Assessment & Plan Note (Signed)
Still an ongoing issue and significantly impacting his quality of life and ADLs.  He has upcoming MRI this weekend and will follow-up with sports medicine to discuss neck steps afterwards.

## 2021-07-06 NOTE — Patient Instructions (Signed)
It was very nice to see you today!  No changes today.  Hopefully will get some results from your MRI.  I will see you back in about 6 months for your regular check up with labs.  Come back sooner if needed.  Take care, Dr [name]  PLEASE NOTE:  If you had any lab tests please let us know if you have not heard back within a few days. You may see your results on mychart before we have a chance to review them but we will give you a call once they are reviewed by [place]. If we ordered any referrals today, please let us know if you have not heard from their office within the next week.   Please try these tips to maintain a healthy lifestyle:  Eat at least 3 REAL meals and 1-2 snacks per day.  Aim for no more than 5 hours between eating.  If you eat breakfast, please do so within one hour of getting up.   Each meal should contain half fruits/vegetables, one quarter protein, and one quarter carbs (no bigger than a computer mouse)  Cut down on sweet beverages. This includes juice, soda, and sweet tea.   Drink at least 1 glass of water with each meal and aim for at least 8 glasses per day  Exercise at least 150 minutes every week.

## 2021-07-06 NOTE — Progress Notes (Signed)
   William Norman is a 81 y.o. male who presents today for an office visit.  Assessment/Plan:  Chronic Problems Addressed Today: Lumbar radiculopathy Still an ongoing issue and significantly impacting his quality of life and ADLs.  He has upcoming MRI this weekend and will follow-up with sports medicine to discuss neck steps afterwards.  Essential hypertension At goal on atenolol 25 mg daily.  CAD (coronary artery disease), native coronary artery Stable on Eliquis 5 mg twice daily and Lipitor 40 mg daily.     Subjective:  HPI:  Patient here for knee and back follow up.  We last saw him about 6 months ago for routine follow up. Since our last visit he was evaluated by sports medicine for his orthopedic pain. He eventually underwent an MRI of his lumbar spine which showed advanced degeneration including foraminal impingement and spinal stenosis. He was then referred to neurosurgery and ultimately underwent laminectomy 3 months ago after trial of ESI. This unfortunately did not help much. HE followed back up with sports medicine who recommended he follow back up with neurosurgery. He was then referred to orthopedics due to concern for arthritis in his left knee. He underwent a steroid injection a few weeks ago by the orthopedist which did not help. He was referred to PT. He has done a couple of sessions of this and he is not sure if this is helping. He is still having a lot issues with both pain and weakness in both of his legs and he is concerned about his legs giving out.    See A/p for status of chronic conditions       Objective:  Physical Exam: BP 139/88   Pulse 77   Temp 97.9 F (36.6 C) (Temporal)   Ht 6' (1.829 m)   Wt 213 lb 6.4 oz (96.8 kg)   SpO2 99%   BMI 28.94 kg/m   Gen: No acute distress, resting comfortably CV: Regular rate and rhythm with no murmurs appreciated Pulm: Normal work of breathing, clear to auscultation bilaterally with no crackles, wheezes, or  rhonchi Neuro: Grossly normal, moves all extremities Psych: Normal affect and thought content  Time Spent: 45 minutes of total time was spent on the date of the encounter performing the following actions: chart review prior to seeing the patient including recent visits with previous specialists, obtaining history, performing a medically necessary exam, counseling on the treatment plan, placing orders, and documenting in our EHR.        [name]. [name], MD 07/06/2021 12:24 PM

## 2021-07-06 NOTE — Assessment & Plan Note (Signed)
Stable on Eliquis 5 mg twice daily and Lipitor 40 mg daily.

## 2021-07-06 NOTE — Assessment & Plan Note (Signed)
At goal on atenolol 25 mg daily.

## 2021-07-10 ENCOUNTER — Ambulatory Visit
Admission: RE | Admit: 2021-07-10 | Discharge: 2021-07-10 | Disposition: A | Discharge status: Home / Self Care | Payer: Medicare HMO | Source: Ambulatory Visit | Attending: Family Medicine | Admitting: Family Medicine

## 2021-07-10 DIAGNOSIS — M5416 Radiculopathy, lumbar region: Secondary | ICD-10-CM

## 2021-07-10 DIAGNOSIS — M545 Low back pain, unspecified: Secondary | ICD-10-CM | POA: Diagnosis not present

## 2021-07-10 DIAGNOSIS — M48061 Spinal stenosis, lumbar region without neurogenic claudication: Secondary | ICD-10-CM | POA: Diagnosis not present

## 2021-07-10 DIAGNOSIS — M4316 Spondylolisthesis, lumbar region: Secondary | ICD-10-CM | POA: Diagnosis not present

## 2021-07-10 DIAGNOSIS — M79605 Pain in left leg: Secondary | ICD-10-CM | POA: Diagnosis not present

## 2021-07-10 IMAGING — MR MR LUMBAR SPINE W/O CM
4 of 5 series · 26 of 48 positions shown · non-contrast
Comparison: MRI lumbar spine [DATE]

CLINICAL DATA: Lumbar radiculopathy with left leg pain. History of
back surgery

EXAM:
MRI LUMBAR SPINE WITHOUT CONTRAST
TECHNIQUE: Multiplanar, multisequence MR imaging of the lumbar spine was
performed. No intravenous contrast was administered.

[Series 3: T2 · sagittal · 4.0mm · 1.09mm/px · 6 of 17 slices shown (1 of 2)]
[im 1/17]
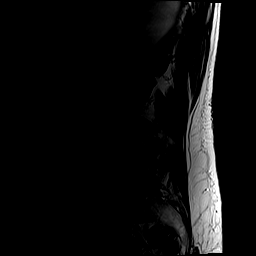
[im 4/17]
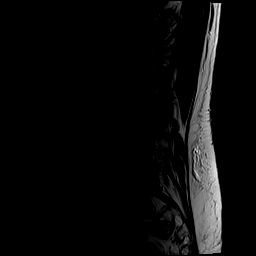
[im 7/17]
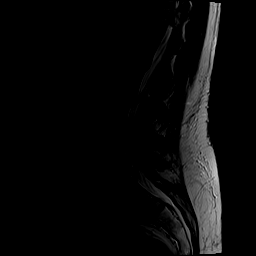
[im 10/17]
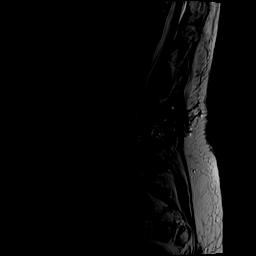
[im 13/17]
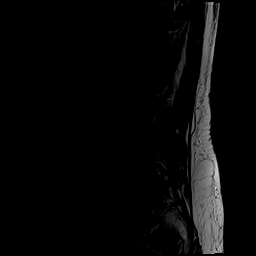
[im 17/17]
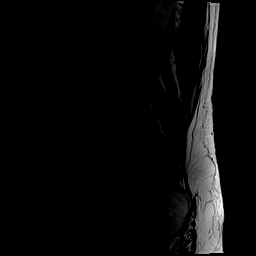

[Series 5: T1 · sagittal · 4.0mm · 1.09mm/px · 6 of 17 slices shown (1 of 2)]
[im 1/17]
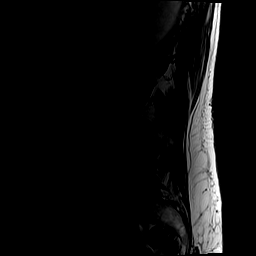
[im 4/17]
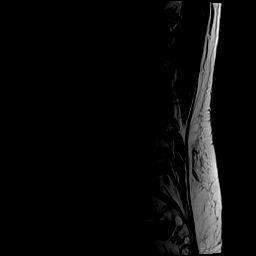
[im 7/17]
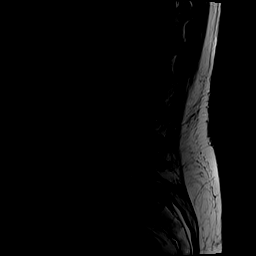
[im 10/17]
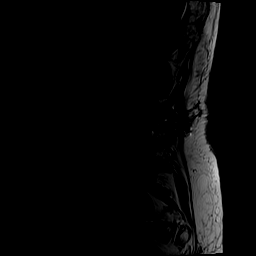
[im 13/17]
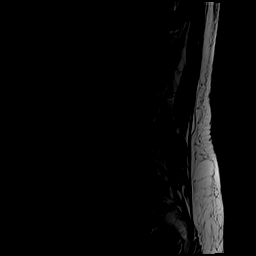
[im 17/17]
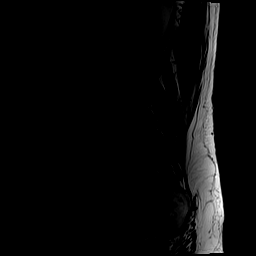

[Series 6: T2 · axial · 4.0mm · 0.39mm/px · z∈[+18,+257]mm · 9 of 46 slices shown (2 of 2)]
[im 1/46]
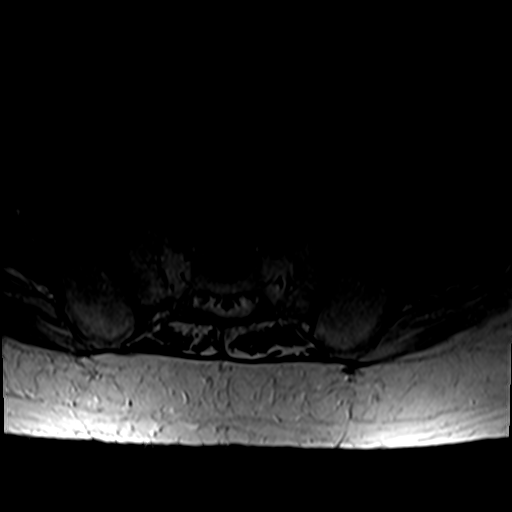
[im 7/46]
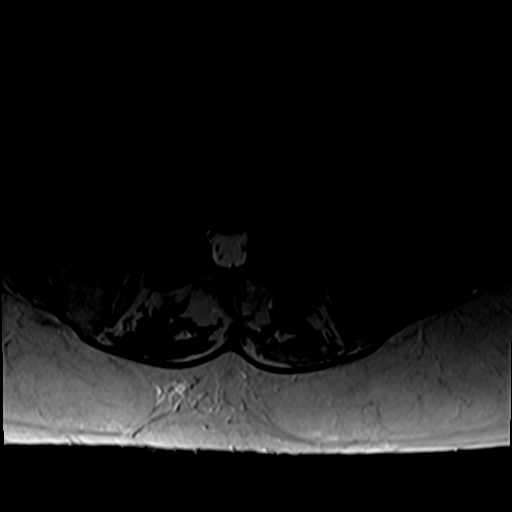
[im 13/46]
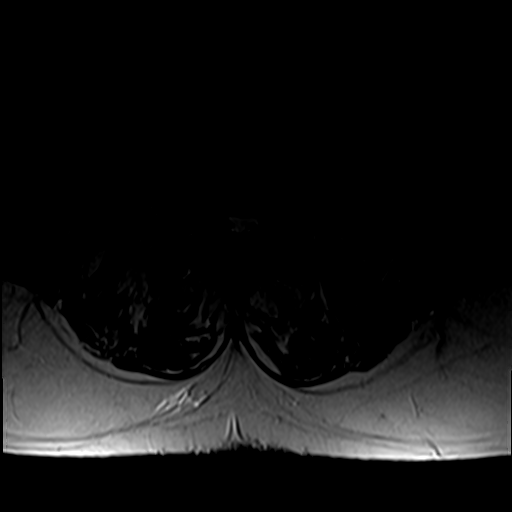
[im 20/46]
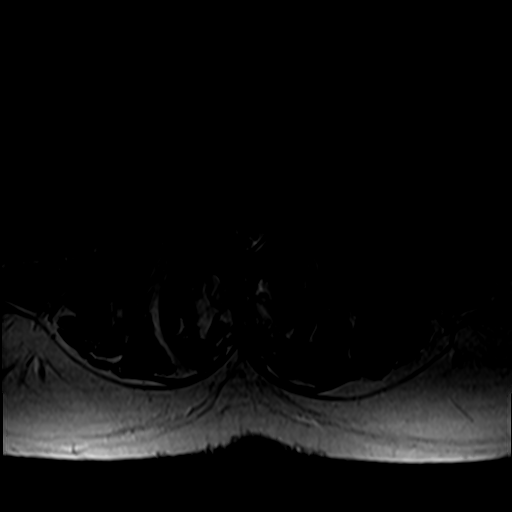
[im 23/46]
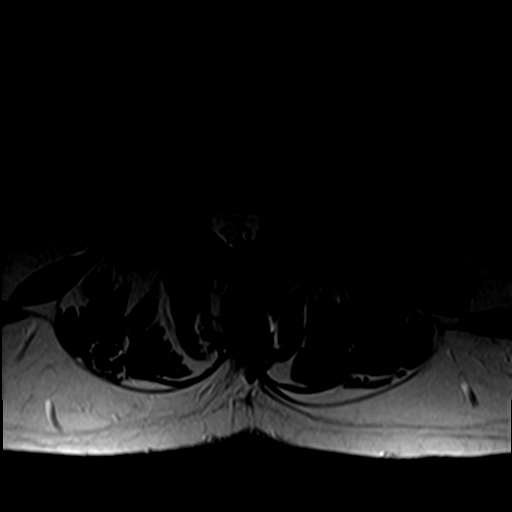
[im 26/46]
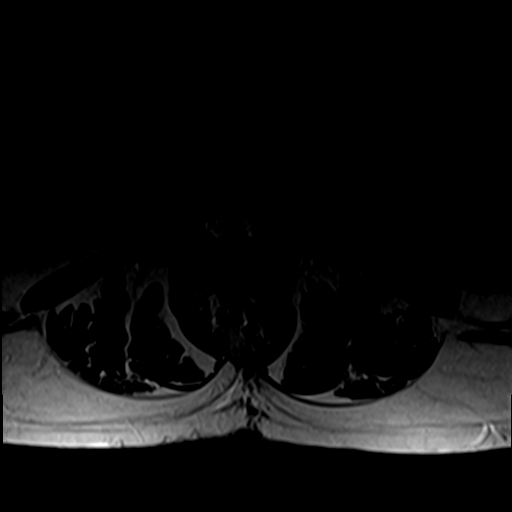
[im 33/46]
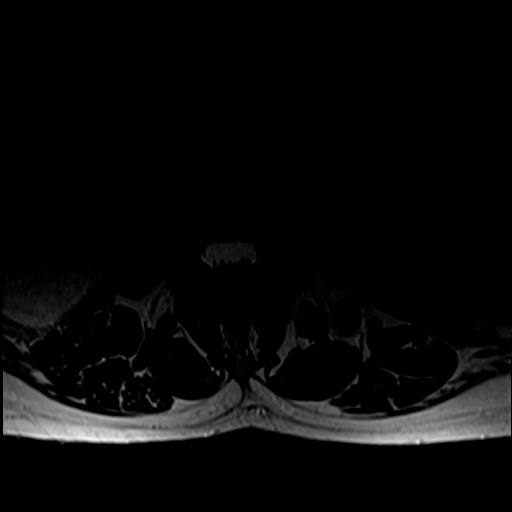
[im 39/46]
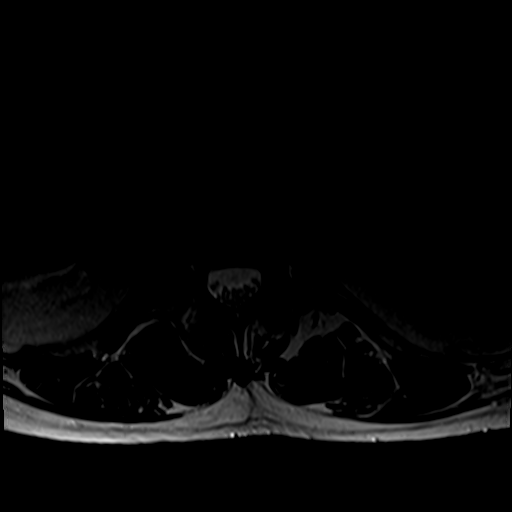
[im 46/46]
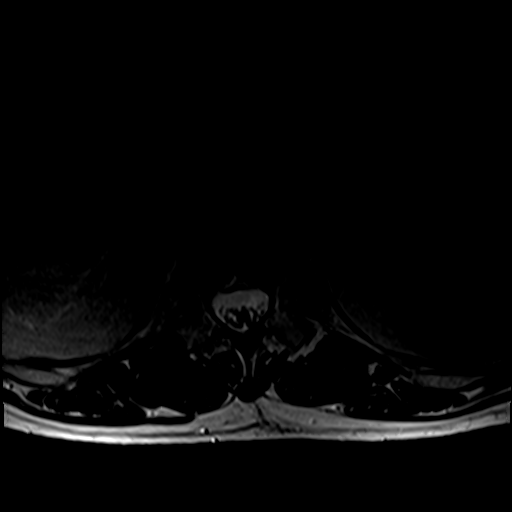

[Series 7: T1 · axial · 4.0mm · 0.39mm/px · z∈[+18,+223]mm · 5 of 46 slices shown (2 of 2)]
[im 1/46]
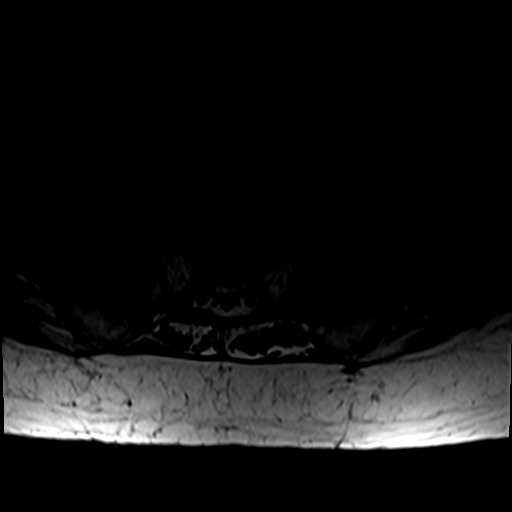
[im 7/46]
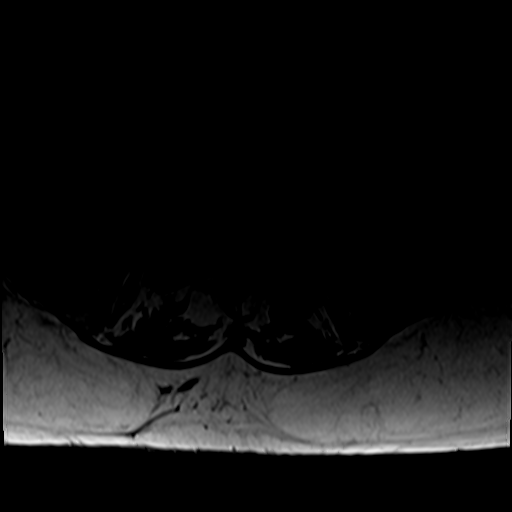
[im 13/46]
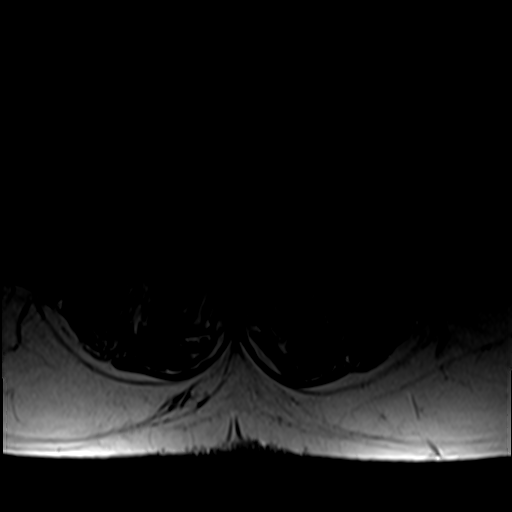
[im 23/46]
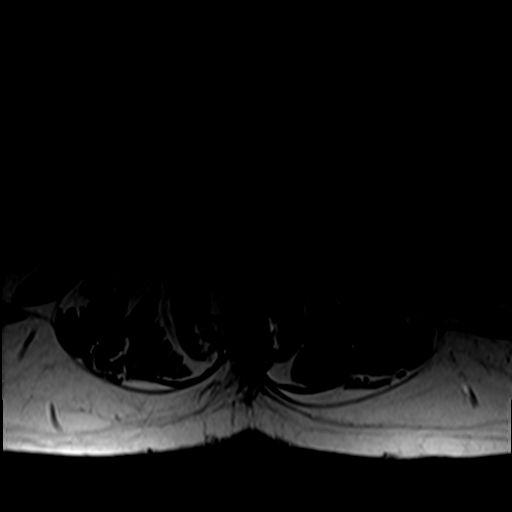
[im 39/46]
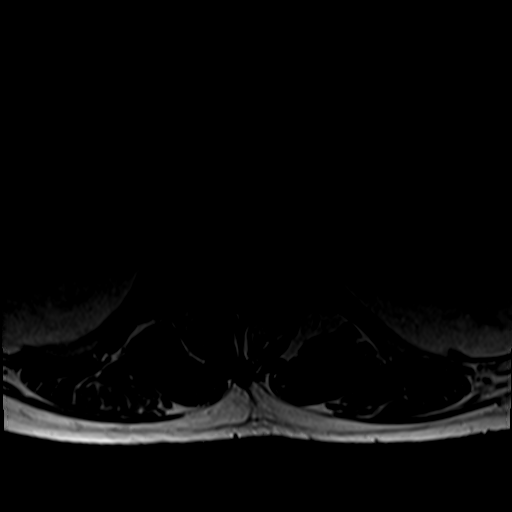

[26 of 48 positions shown; findings below may reference images not displayed]

FINDINGS: Segmentation:  5 lumbar vertebra

Alignment: Mild retrolisthesis L1-2, L2-3, L3-4. Mild
anterolisthesis L4-5 asymmetric to the right unchanged from the
prior study

Vertebrae:  Negative for fracture or mass

Conus medullaris and cauda equina: Conus extends to the T12-L1
level. Conus and cauda equina appear normal.

Paraspinal and other soft tissues: Negative for paraspinous mass or
adenopathy. Bladder trabeculation.

Disc levels:

L1-2: Mild disc and facet degeneration. Asymmetric spurring lateral
to the foramina left unchanged. Mild subarticular stenosis on the
left

L2-3: Disc degeneration with diffuse disc bulging. Asymmetric disc
protrusion on the left similar to the prior study. Moderate facet
hypertrophy. Moderate central canal stenosis. Moderate to advanced
subarticular foraminal stenosis on the left unchanged. Expected left
L2 and L3 nerve root impingement.

L3-4: Left laminectomy is been performed in the interval. Disc
degeneration with diffuse disc bulging. Moderate to advanced facet
hypertrophy bilaterally. Spinal canal adequately decompressed.
Moderate to advanced subarticular and foraminal stenosis bilaterally

L4-5: Severe facet degeneration asymmetric on the right. Severe
subarticular and foraminal stenosis on the right due to spurring
without interval change. Mild to moderate central canal stenosis
unchanged. Mild left subarticular stenosis

L5-S1: Bilateral facet degeneration. Negative for disc protrusion. 4
mm synovial cyst on the left unchanged. No definite neural
impingement.
IMPRESSION: 1. Moderate central canal stenosis L2-3 unchanged. Asymmetric disc
protrusion on the left unchanged. Moderate to advanced subarticular
and foraminal stenosis on the left unchanged.
2. Postop left laminectomy L3-4. Spinal canal adequately
decompressed. Moderate to advanced subarticular and foraminal
stenosis bilaterally similar to the prior study.
3. Severe facet degeneration on the right with asymmetric
anterolisthesis of L4 on L5. Severe subarticular and foraminal
stenosis on the right unchanged.
4. 4 mm synovial cyst on the left unchanged without neural
impingement.

## 2021-07-13 ENCOUNTER — Encounter: Payer: Self-pay | Admitting: Family Medicine

## 2021-07-13 ENCOUNTER — Ambulatory Visit: Payer: Medicare HMO | Admitting: Family Medicine

## 2021-07-13 VITALS — BP 156/82 | HR 71 | Ht 72.0 in | Wt 211.0 lb

## 2021-07-13 DIAGNOSIS — M5416 Radiculopathy, lumbar region: Secondary | ICD-10-CM | POA: Diagnosis not present

## 2021-07-13 DIAGNOSIS — R29898 Other symptoms and signs involving the musculoskeletal system: Secondary | ICD-10-CM

## 2021-07-13 NOTE — Progress Notes (Unsigned)
I, William Norman, LAT, ATC, am serving as scribe for Dr. Lynne Leader.  William Norman is a 81 y.o. male who presents to [place] at [facility] today for f/u of lumbar radiculopathy w/ weakness in his L leg and MRI review. Pt had a L laminotomy, foraminotomy at L3-4 on 03/18/21 by Dr. Kathyrn Sheriff. Pt saw Dr. Erlinda Hong on 06/17/21 and was given a L knee steroid injection and referred to PT. Pt has had prior trial of ESI on 01/25/21 and 01/11/21. Pt was last seen by Dr. Georgina Snell on 06/23/21 and advised to proceed to MRI and was referred to PT, completing 2 visits. Today, pt reports that he con't to have near constant pain in his L lower leg from his knee to the bottom of his foot.  He also has pain in his lower back and buttocks that is intermittent in nature.  He con't to have weakness in his L leg, including his hip, quad and L ankle dorsiflexors.  Dx imaging: 07/10/21 L-spine MRI  L knee XR- 06/07/21 at [facility] 03/18/21 L-spine XR 12/20/20 L-spine MRI             02/26/20 L-spine XR             02/15/07 L-spine XR  Pertinent review of systems: No fevers or chills  Relevant historical information: History of a stroke.  History of DVT.   Exam:  BP (!) 156/82 (BP Location: Right Arm, Patient Position: Sitting, Cuff Size: Normal)   Pulse 71   Ht 6' (1.829 m)   Wt 211 lb (95.7 kg)   SpO2 94%   BMI 28.62 kg/m  General: Well Developed, well nourished, and in no acute distress.   MSK: L-spine: Nontender midline. Decrease strength to the left hip flexion knee extension and left foot dorsiflexion.    Lab and Radiology Results No results found for this or any previous visit (from the past 72 hour(s)). MR LUMBAR SPINE WO CONTRAST  Result Date: 07/13/2021 CLINICAL DATA:  Lumbar radiculopathy with left leg pain. History of back surgery EXAM: MRI LUMBAR SPINE WITHOUT CONTRAST TECHNIQUE: Multiplanar, multisequence MR imaging of the lumbar spine was performed. No intravenous  contrast was administered. COMPARISON:  MRI lumbar spine 12/20/2020 FINDINGS: Segmentation:  5 lumbar vertebra Alignment: Mild retrolisthesis L1-2, L2-3, L3-4. Mild anterolisthesis L4-5 asymmetric to the right unchanged from the prior study Vertebrae:  Negative for fracture or mass Conus medullaris and cauda equina: Conus extends to the T12-L1 level. Conus and cauda equina appear normal. Paraspinal and other soft tissues: Negative for paraspinous mass or adenopathy. Bladder trabeculation. Disc levels: L1-2: Mild disc and facet degeneration. Asymmetric spurring lateral to the foramina left unchanged. Mild subarticular stenosis on the left L2-3: Disc degeneration with diffuse disc bulging. Asymmetric disc protrusion on the left similar to the prior study. Moderate facet hypertrophy. Moderate central canal stenosis. Moderate to advanced subarticular foraminal stenosis on the left unchanged. Expected left L2 and L3 nerve root impingement. L3-4: Left laminectomy is been performed in the interval. Disc degeneration with diffuse disc bulging. Moderate to advanced facet hypertrophy bilaterally. Spinal canal adequately decompressed. Moderate to advanced subarticular and foraminal stenosis bilaterally L4-5: Severe facet degeneration asymmetric on the right. Severe subarticular and foraminal stenosis on the right due to spurring without interval change. Mild to moderate central canal stenosis unchanged. Mild left subarticular stenosis L5-S1: Bilateral facet degeneration. Negative for disc protrusion. 4 mm synovial cyst on the left unchanged. No definite neural impingement. IMPRESSION:  1. Moderate central canal stenosis L2-3 unchanged. Asymmetric disc protrusion on the left unchanged. Moderate to advanced subarticular and foraminal stenosis on the left unchanged. 2. Postop left laminectomy L3-4. Spinal canal adequately decompressed. Moderate to advanced subarticular and foraminal stenosis bilaterally similar to the prior study.  3. Severe facet degeneration on the right with asymmetric anterolisthesis of L4 on L5. Severe subarticular and foraminal stenosis on the right unchanged. 4. 4 mm synovial cyst on the left unchanged without neural impingement. Electronically Signed   By: Franchot Gallo M.D.   On: 07/13/2021 11:37    I, Lynne Leader, personally (independently) visualized and performed the interpretation of the images attached in this note.    Assessment and Plan: 81 y.o. male with significant weakness left leg.  This is a new finding.  L-spine MRI updated and his current does show changes at L2-3 which are consistent with his worsening symptoms especially to hip flexion.  Plan for epidural steroid injection now.  He will need to temporarily stop his Eliquis prior to the epidural steroid injection. He will we will keep you updated as well.  Total encounter time 30 minutes including face-to-face time with the patient and, reviewing past medical record, and charting on the date of service.   Reviewed MRI discussed treatment plan and options.  PDMP not reviewed this encounter. Orders Placed This Encounter  Procedures   DG INJECT DIAG/THERA/INC NEEDLE/CATH/PLC EPI/LUMB/SAC W/IMG    LUMB EPI #2 [employer]ET[employer] MCR  PACS (07/10/21) 211 LBS *ELIQUIS*    Standing Status:   Future    Standing Expiration Date:   07/14/2022    Order Specific Question:   Reason for Exam (SYMPTOM  OR DIAGNOSIS REQUIRED)    Answer:   Left 2-3 technique per radiology    Order Specific Question:   Preferred Imaging Location?    Answer:   [id number]    Order Specific Question:   Radiology Contrast Protocol - do NOT remove file path    Answer:   [email or web address]   No orders of the defined types were placed in this encounter.    Discussed warning signs or symptoms. Please see discharge instructions. Patient expresses understanding.   The above documentation has been reviewed and is accurate and  complete Lynne Leader, M.D.

## 2021-07-13 NOTE — Patient Instructions (Addendum)
Good to see you today.  Stop the Eliquis 24-48 hours prior to the back injection  Please call [facility] at [phone] to schedule your spine injection.    Follow-up: as needed  For your wife: [facility]  [phone] [address], [place], [place], [place], [address]

## 2021-07-14 ENCOUNTER — Ambulatory Visit: Payer: Medicare HMO | Admitting: Physical Therapy

## 2021-07-14 ENCOUNTER — Other Ambulatory Visit: Payer: Self-pay | Admitting: Cardiology

## 2021-07-14 DIAGNOSIS — M5459 Other low back pain: Secondary | ICD-10-CM | POA: Diagnosis not present

## 2021-07-14 DIAGNOSIS — M6281 Muscle weakness (generalized): Secondary | ICD-10-CM

## 2021-07-14 NOTE — Therapy (Signed)
OUTPATIENT PHYSICAL THERAPY TREATMENT    Patient Name: Lem Peary MRN: [id number] DOB:Nov 13, 1940, 81 y.o., male Today's Date: 07/14/2021   PT End of Session - 07/18/21 2031     Visit Number 3    Number of Visits 12    Date for PT Re-Evaluation 08/09/21    Authorization Type [employer]na Medicare    PT Start Time 1435    PT Stop Time 6761    PT Time Calculation (min) 40 min    Activity Tolerance Patient tolerated treatment well    Behavior During Therapy [facility] for tasks assessed/performed               Past Medical History:  Diagnosis Date   BPH (benign prostatic hypertrophy)    BPH associated with nocturia 08/29/2013   BPH with obstruction/lower urinary tract symptoms 08/14/2019   CAD (coronary artery disease)    CAD (coronary artery disease), native coronary artery    PTCA of RCA 1986, PTCA of LAD 1992,  Negative treadmill Cardiolite 2011    Chronic dermatitis of hands 10/12/2015   Chronic venous insufficiency    Coagulation disorder ([place]) 10/26/2018   DVT (deep venous thrombosis) ([place])    Dyslipidemia    Dysrhythmia    Eczema    Erectile dysfunction 07/11/2012   Essential hypertension    GERD 08/04/2006       GERD (gastroesophageal reflux disease)    Hyperglycemia 07/08/2019   Hyperlipidemia    Hypertension    Incomplete emptying of bladder 08/14/2019   Long term current use of anticoagulant therapy    Lumbar disc disease    Overweight (BMI 25.0-29.9) 06/18/2015   Persistent atrial fibrillation ([place]) 04/10/2018   Personal history of DVT (deep vein thrombosis)    Initially in 1998 with recurrence in 2002 now on chronic warfarin    Soft tissue lesion of foot 08/27/2015   Stroke [facility])    Urinary tract infection with hematuria 08/14/2019   Past Surgical History:  Procedure Laterality Date   APPLICATION OF WOUND VAC  09/02/2020   Procedure: APPLICATION OF WOUND VAC;  Surgeon: Donnie Mesa, MD;  Location: [place];  Service: General;;   CARDIAC  CATHETERIZATION     CARPAL TUNNEL RELEASE  04/21/2011   Procedure: CARPAL TUNNEL RELEASE;  Surgeon: Tennis Must, MD;  Location: [place];  Service: Orthopedics;  Laterality: Left;   CARPAL TUNNEL RELEASE  05/23/2011   Procedure: CARPAL TUNNEL RELEASE;  Surgeon: Tennis Must, MD;  Location: [place];  Service: Orthopedics;  Laterality: Right;   COLON SURGERY  2010 and 2011 colostomy reversal   CORONARY ANGIOPLASTY     EYE SURGERY     repair of macular hole   LAPAROTOMY N/A 09/02/2020   Procedure: EXPLORATORY LAPAROTOMY, LYSIS OF ADHESIONS ,BOWEL RESECTION;  Surgeon: Donnie Mesa, MD;  Location: [place];  Service: General;  Laterality: N/A;   LUMBAR LAMINECTOMY/DECOMPRESSION MICRODISCECTOMY Left 03/18/2021   Procedure: LAMINOTOMY, FORAMINOTOMY, LEFT LUMBAR THREE-FOUR;  Surgeon: Consuella Lose, MD;  Location: [place];  Service: Neurosurgery;  Laterality: Left;   NASAL SEPTUM SURGERY     precutaneous transluminal coronary angioplasty     right hernia     TONSILLECTOMY AND ADENOIDECTOMY     Patient Active Problem List   Diagnosis Date Noted   Osteoarthritis 11/20/2020   Constipation 10/02/2020   Protein-calorie malnutrition, severe 09/03/2020   SBO (small bowel obstruction) ([place]) 08/28/2020   AAA (abdominal aortic aneurysm) ([place]) 03/06/2020   Porokeratosis 02/25/2020  History of ischemic stroke 02/16/2020   Eczema    DVT (deep venous thrombosis) ([facility])    CAD (coronary artery disease)    Recurrent UTI 08/14/2019   Hyperglycemia 07/08/2019   Persistent atrial fibrillation ([place]) 04/10/2018   Chronic dermatitis of hands 10/12/2015   Soft tissue lesion of foot 08/27/2015   Stroke ([place]) 06/18/2015   BPH associated with nocturia 08/29/2013   CAD (coronary artery disease), native coronary artery    Long term current use of anticoagulant therapy    Personal history of DVT (deep vein thrombosis)    Chronic venous insufficiency    Erectile dysfunction  07/11/2012   Lumbar radiculopathy    GERD 08/04/2006   Dyslipidemia    Essential hypertension     PCP: Dimas Chyle  REFERRING PROVIDER: Lynne Leader  REFERRING DIAG: Lumbar Radiculopathy  THERAPY DIAG:  Other low back pain  Muscle weakness (generalized)  ONSET DATE:   SUBJECTIVE:                                                                                                                                                                                           SUBJECTIVE STATEMENT: Pt states "im getting worse every day" Feels a lot of weakness in LE.   Eval: Pt reports ongoing pain and difficulty with back and legs. He had surgery in feb 2022, states minimal relief. He continues to have bothersome pain in back, glutes, and LEs. He notes worsening weakness in legs.  He does have SPC, not using all the time. He did have one recent fall where his leg gave out on him.  Taking meds for pain/ tylenol, only getting some relief Pain in L low back,  First think in AM pain is the worst, R>L,   L foot drop, Radicular pain in L leg into toes.  Also has pain in L knee, with weakness. Did have injection recently. States sometimes has no pain, but has sharp pains at times with turning and walking,  and feels like its going to give way.  Full flight of stairs at home to bedroom,    PERTINENT HISTORY:  Stroke, CAD, A-fib, Previous back surgery Feb 2022  PAIN:  Are you having pain? Yes: NPRS scale: 7-8 /10 Pain location: Back, into glutes, hips, legs  Pain description: radiating  Aggravating factors: standing, walking,  Relieving factors: sitting   Are you having pain? Yes: NPRS scale: 8/10 Pain location: L knee  Pain description: Weakness, feels like its doing to give out, has had fall from this.  Aggravating factors: Only if he turns wrong on it.  Relieving factors: none  PRECAUTIONS: Fall   FALLS:  Has patient fallen in last 6 months? Yes. Number of falls 1 L knee gave  out, was able to get himself up, no injury   PLOF: Independent  PATIENT GOALS   Decreased pain, leg strength.    OBJECTIVE:   DIAGNOSTIC FINDINGS: states he had L knee x-ray, do not see results.   COGNITION:  Overall cognitive status: Within functional limits for tasks assessed     PALPATION:   LUMBAR ROM:  Hip ROM: Mild limitation for flex and rotation  Active  AROM  06/28/2021  Flexion Mod/sig limitation  Extension Sig limitation  Right lateral flexion Mod limitation  Left lateral flexion Mod limitation   Right rotation   Left rotation    (Blank rows = not tested)   LE MMT:  Hips: 4/5,   GAIT: Distance walked: 100 Assistive device utilized: Single point cane and None Level of assistance: Modified independence Comments: foot drop on L, causing increased hip flexion with swing , inability for heel strike with initial contact.     TODAY'S TREATMENT   07/14/21: Therapeutic Exercise:  Aerobic: Supine:   Quad sets x 15 on L;  Supine march x 15 ; Bridging x 15;  Seated:  LAQ with assist x 10 (painful);  sit to stand x 10 with practice for equal weight bearing bil;  Standing: L/R weight shifts (therapist blocking L knee) x 20;  Stretches: pelvic tilts x15;  SKTC 30 sec x 3 bil; LTR x 15;  Neuromuscular Re-education: Manual Therapy: long leg distraction for bil LE, for lumbar pump; Lumbar PA mobs;  Self Care:    PATIENT EDUCATION:  Education details: reviewed HEP Person educated: Patient Education method: Explanation, Demonstration, Tactile cues, Verbal cues, and Handouts Education comprehension: verbalized understanding, returned demonstration, verbal cues required, tactile cues required, and needs further education   HOME EXERCISE PROGRAM: Access Code: [email or web address]    ASSESSMENT:  CLINICAL IMPRESSION:  07/14/21:  Pt with significant weakness in L LE. Unable to perform LAQ due to pain, and unable to perform SLR due to weakness. Was able to do QS and  supine march without pain. He is very unsteady in standing, due to quad weakness and tendency for L knee to buckle. Plan to progress strength as tolerated, and work on balance/gait/safety.     Eval: Pt presents with primary complaint of ongoing pain and deficits in back. He has had minimal relief from surgery and injections, and continues to have pain into bil glutes and LEs. He has severe weakness noted in L quad as well as L DF, which is causing foot drop. He has poor gait pattern due to foot drop, and may benefit from evaluation for AFO. He has decreased ability and tolerance for standing and walking activity, due to pain and weakness. He has had 1 recent fall from leg giving out on him. Pt to benefit from skilled PT to improve deficits and pain.    OBJECTIVE IMPAIRMENTS Abnormal gait, decreased activity tolerance, decreased balance, decreased coordination, decreased endurance, decreased knowledge of use of DME, decreased mobility, difficulty walking, decreased ROM, decreased strength, decreased safety awareness, improper body mechanics, and pain.   ACTIVITY LIMITATIONS cleaning, community activity, driving, yard work, and shopping.   PERSONAL FACTORS 1-2 comorbidities: stroke, A-fib,  back surgery,   are also affecting patient's functional outcome.    REHAB POTENTIAL: Fair    CLINICAL DECISION MAKING: Evolving/moderate complexity  EVALUATION COMPLEXITY: Moderate   GOALS: Goals reviewed with patient? Yes  SHORT TERM GOALS: Target date: 07/12/21   Pt to be independent with initial HEP  Goal status: INITIAL     LONG TERM GOALS: Target date: 08/09/2021    Pt to be independent with final HEP  Goal status: INITIAL  2.  Pt to demo improved strength of L knee and hip to at least 4/5 for extension   Goal status: INITIAL  3.  Pt to report decreased pain in back and LEs to 3/10 with activity.   Goal status: INITIAL   4.  Pt to demo ability for safe ambulation up to .25 mi, to  improve ability for community activity and shopping.   Goal status: INITIAL     PLAN: PT FREQUENCY: 2x/week  PT DURATION: 6 weeks  PLANNED INTERVENTIONS: Therapeutic exercises, Therapeutic activity, Neuromuscular re-education, Balance training, Gait training, Patient/Family education, Joint manipulation, Joint mobilization, Stair training, DME instructions, Dry Needling, Electrical stimulation, Spinal manipulation, Spinal mobilization, Cryotherapy, Moist heat, Taping, Traction, Ionotophoresis '4mg'$ /ml Dexamethasone, and Manual therapy.  PLAN FOR NEXT SESSION: issue HEP, teach flexion stretches, lumbar distraction, LE strength, quad and hip strength.   Lyndee Hensen, PT, DPT 8:35 PM  07/18/21

## 2021-07-15 ENCOUNTER — Telehealth: Payer: Self-pay

## 2021-07-15 NOTE — Telephone Encounter (Signed)
Clinical pharmacist to review Eliquis 

## 2021-07-15 NOTE — Telephone Encounter (Signed)
   Pre-operative Risk Assessment    Patient Name: William Norman  DOB: Apr 14, 1940 MRN: [id number]      Request for Surgical Clearance    Procedure:   lumbar epidural  Date of Surgery:  Clearance TBD                                 Surgeon:   Surgeon's Group or Practice Name:  [facility] Phone number:  [phone] Fax number:  [phone]   Type of Clearance Requested:   - Pharmacy:  Hold Apixaban (Eliquis) 2 days   Type of Anesthesia:  Not Indicated   Additional requests/questions:    Gretchen Short   07/15/2021, 1:24 PM

## 2021-07-16 NOTE — Telephone Encounter (Signed)
Follow Up:    Patient is calling to find out what to do about his Eliquis.

## 2021-07-16 NOTE — Telephone Encounter (Signed)
   Patient Name: William Norman  DOB: 06-01-1940 MRN: [id number]  Primary Cardiologist: Shirlee More, MD  Chart reviewed as part of pre-operative protocol coverage. Please see requested recommendation for holding Eliquis.  Per office protocol, recommend holding Eliquis for 3 days prior to lumbar procedure, without a Lovenox bridge. He should resume anticoag as soon as safely possible after his procedure due to his elevated CV risk.   Mable Fill, Marissa Nestle, NP 07/16/2021, 3:52 PM

## 2021-07-16 NOTE — Telephone Encounter (Addendum)
Patient with diagnosis of afib on Eliquis for anticoagulation.    Procedure: lumbar ESI Date of procedure: TBD  CHA2DS2-VASc Score = 7  This indicates a 11.2% annual risk of stroke. The patient's score is based upon: CHF History: 1 HTN History: 1 Diabetes History: 0 Stroke History: 2 Vascular Disease History: 1 Age Score: 2 Gender Score: 0   Also with hx of DVTs in 1998 and 2002.  CrCl 74m/min Platelet count 215K  Presented 02/2020 with stroke felt to be secondary to afib when warfarin was held for cystoscopy. He was changed to Eliquis at that time.   Previously requested a 2 day hold for a lumbar ESI in 12/2020. Dr. MBettina Gaviaagreed with request for a 2 day hold without Lovenox bridge.    Per office protocol, recommend holding Eliquis for 3 days prior to lumbar procedure, without a Lovenox bridge. He should resume anticoag as soon as safely possible after his procedure due to his elevated CV risk.

## 2021-07-16 NOTE — Telephone Encounter (Signed)
Pt is calling back and requesting he be called back to discuss why it is taking so long for the pharmacist to send over clearance.

## 2021-07-18 ENCOUNTER — Encounter: Payer: Self-pay | Admitting: Physical Therapy

## 2021-07-19 DIAGNOSIS — Z008 Encounter for other general examination: Secondary | ICD-10-CM | POA: Diagnosis not present

## 2021-07-19 DIAGNOSIS — Z7901 Long term (current) use of anticoagulants: Secondary | ICD-10-CM | POA: Diagnosis not present

## 2021-07-19 DIAGNOSIS — N529 Male erectile dysfunction, unspecified: Secondary | ICD-10-CM | POA: Diagnosis not present

## 2021-07-19 DIAGNOSIS — Z6830 Body mass index (BMI) 30.0-30.9, adult: Secondary | ICD-10-CM | POA: Diagnosis not present

## 2021-07-19 DIAGNOSIS — K219 Gastro-esophageal reflux disease without esophagitis: Secondary | ICD-10-CM | POA: Diagnosis not present

## 2021-07-19 DIAGNOSIS — R32 Unspecified urinary incontinence: Secondary | ICD-10-CM | POA: Diagnosis not present

## 2021-07-19 DIAGNOSIS — E785 Hyperlipidemia, unspecified: Secondary | ICD-10-CM | POA: Diagnosis not present

## 2021-07-19 DIAGNOSIS — I25119 Atherosclerotic heart disease of native coronary artery with unspecified angina pectoris: Secondary | ICD-10-CM | POA: Diagnosis not present

## 2021-07-19 DIAGNOSIS — G629 Polyneuropathy, unspecified: Secondary | ICD-10-CM | POA: Diagnosis not present

## 2021-07-19 DIAGNOSIS — M1990 Unspecified osteoarthritis, unspecified site: Secondary | ICD-10-CM | POA: Diagnosis not present

## 2021-07-19 DIAGNOSIS — I10 Essential (primary) hypertension: Secondary | ICD-10-CM | POA: Diagnosis not present

## 2021-07-19 DIAGNOSIS — E669 Obesity, unspecified: Secondary | ICD-10-CM | POA: Diagnosis not present

## 2021-07-19 DIAGNOSIS — R600 Localized edema: Secondary | ICD-10-CM | POA: Diagnosis not present

## 2021-07-20 ENCOUNTER — Telehealth: Payer: Self-pay | Admitting: Family Medicine

## 2021-07-20 NOTE — Telephone Encounter (Signed)
[facility] received 10 pages of medical records from [facility]. Sending interoffice mail to [employer] at [place] 07/20/21  KLM

## 2021-07-21 ENCOUNTER — Ambulatory Visit: Payer: Medicare HMO | Admitting: Physical Therapy

## 2021-07-21 ENCOUNTER — Encounter: Payer: Self-pay | Admitting: Physical Therapy

## 2021-07-21 DIAGNOSIS — M5459 Other low back pain: Secondary | ICD-10-CM | POA: Diagnosis not present

## 2021-07-21 DIAGNOSIS — M6281 Muscle weakness (generalized): Secondary | ICD-10-CM | POA: Diagnosis not present

## 2021-07-21 NOTE — Therapy (Signed)
OUTPATIENT PHYSICAL THERAPY TREATMENT    Patient Name: William Norman MRN: [id number] DOB:1940-05-23, 81 y.o., male Today's Date: 07/21/2021   PT End of Session - 07/21/21 1113     Visit Number 4    Number of Visits 12    Date for PT Re-Evaluation 08/09/21    Authorization Type Aetna Medicare    PT Start Time 41    PT Stop Time 2025    PT Time Calculation (min) 35 min    Activity Tolerance Patient tolerated treatment well    Behavior During Therapy [facility] for tasks assessed/performed               Past Medical History:  Diagnosis Date   BPH (benign prostatic hypertrophy)    BPH associated with nocturia 08/29/2013   BPH with obstruction/lower urinary tract symptoms 08/14/2019   CAD (coronary artery disease)    CAD (coronary artery disease), native coronary artery    PTCA of RCA 1986, PTCA of LAD 1992,  Negative treadmill Cardiolite 2011    Chronic dermatitis of hands 10/12/2015   Chronic venous insufficiency    Coagulation disorder ([place]) 10/26/2018   DVT (deep venous thrombosis) ([place])    Dyslipidemia    Dysrhythmia    Eczema    Erectile dysfunction 07/11/2012   Essential hypertension    GERD 08/04/2006       GERD (gastroesophageal reflux disease)    Hyperglycemia 07/08/2019   Hyperlipidemia    Hypertension    Incomplete emptying of bladder 08/14/2019   Long term current use of anticoagulant therapy    Lumbar disc disease    Overweight (BMI 25.0-29.9) 06/18/2015   Persistent atrial fibrillation ([place]) 04/10/2018   Personal history of DVT (deep vein thrombosis)    Initially in 1998 with recurrence in 2002 now on chronic warfarin    Soft tissue lesion of foot 08/27/2015   Stroke [facility])    Urinary tract infection with hematuria 08/14/2019   Past Surgical History:  Procedure Laterality Date   APPLICATION OF WOUND VAC  09/02/2020   Procedure: APPLICATION OF WOUND VAC;  Surgeon: Donnie Mesa, MD;  Location: [place];  Service: General;;   CARDIAC  CATHETERIZATION     CARPAL TUNNEL RELEASE  04/21/2011   Procedure: CARPAL TUNNEL RELEASE;  Surgeon: Tennis Must, MD;  Location: [place];  Service: Orthopedics;  Laterality: Left;   CARPAL TUNNEL RELEASE  05/23/2011   Procedure: CARPAL TUNNEL RELEASE;  Surgeon: Tennis Must, MD;  Location: [place];  Service: Orthopedics;  Laterality: Right;   COLON SURGERY  2010 and 2011 colostomy reversal   CORONARY ANGIOPLASTY     EYE SURGERY     repair of macular hole   LAPAROTOMY N/A 09/02/2020   Procedure: EXPLORATORY LAPAROTOMY, LYSIS OF ADHESIONS ,BOWEL RESECTION;  Surgeon: Donnie Mesa, MD;  Location: [place];  Service: General;  Laterality: N/A;   LUMBAR LAMINECTOMY/DECOMPRESSION MICRODISCECTOMY Left 03/18/2021   Procedure: LAMINOTOMY, FORAMINOTOMY, LEFT LUMBAR THREE-FOUR;  Surgeon: Consuella Lose, MD;  Location: [place];  Service: Neurosurgery;  Laterality: Left;   NASAL SEPTUM SURGERY     precutaneous transluminal coronary angioplasty     right hernia     TONSILLECTOMY AND ADENOIDECTOMY     Patient Active Problem List   Diagnosis Date Noted   Osteoarthritis 11/20/2020   Constipation 10/02/2020   Protein-calorie malnutrition, severe 09/03/2020   SBO (small bowel obstruction) ([place]) 08/28/2020   AAA (abdominal aortic aneurysm) ([place]) 03/06/2020   Porokeratosis 02/25/2020  History of ischemic stroke 02/16/2020   Eczema    DVT (deep venous thrombosis) ([facility])    CAD (coronary artery disease)    Recurrent UTI 08/14/2019   Hyperglycemia 07/08/2019   Persistent atrial fibrillation ([place]) 04/10/2018   Chronic dermatitis of hands 10/12/2015   Soft tissue lesion of foot 08/27/2015   Stroke ([place]) 06/18/2015   BPH associated with nocturia 08/29/2013   CAD (coronary artery disease), native coronary artery    Long term current use of anticoagulant therapy    Personal history of DVT (deep vein thrombosis)    Chronic venous insufficiency    Erectile dysfunction  07/11/2012   Lumbar radiculopathy    GERD 08/04/2006   Dyslipidemia    Essential hypertension     PCP: Dimas Chyle  REFERRING PROVIDER: Lynne Leader  REFERRING DIAG: Lumbar Radiculopathy  THERAPY DIAG:  Other low back pain  Muscle weakness (generalized)  ONSET DATE:   SUBJECTIVE:                                                                                                                                                                                           SUBJECTIVE STATEMENT: Pt states no improvements, L leg feels very weak. Is scheduled for injection in back on Friday.    Eval: Pt reports ongoing pain and difficulty with back and legs. He had surgery in feb 2022, states minimal relief. He continues to have bothersome pain in back, glutes, and LEs. He notes worsening weakness in legs.  He does have SPC, not using all the time. He did have one recent fall where his leg gave out on him.  Taking meds for pain/ tylenol, only getting some relief Pain in L low back,  First think in AM pain is the worst, R>L,   L foot drop, Radicular pain in L leg into toes.  Also has pain in L knee, with weakness. Did have injection recently. States sometimes has no pain, but has sharp pains at times with turning and walking,  and feels like its going to give way.  Full flight of stairs at home to bedroom,    PERTINENT HISTORY:  Stroke, CAD, A-fib, Previous back surgery Feb 2022  PAIN:  Are you having pain? Yes: NPRS scale: 7-8 /10 Pain location: Back, into glutes, hips, legs  Pain description: radiating  Aggravating factors: standing, walking,  Relieving factors: sitting   Are you having pain? Yes: NPRS scale: 8/10 Pain location: L knee  Pain description: Weakness, feels like its doing to give out, has had fall from this.  Aggravating factors: Only if he turns wrong on it.  Relieving  factors: none    PRECAUTIONS: Fall   FALLS:  Has patient fallen in last 6 months? Yes. Number  of falls 1 L knee gave out, was able to get himself up, no injury   PLOF: Independent  PATIENT GOALS   Decreased pain, leg strength.    OBJECTIVE:   DIAGNOSTIC FINDINGS: states he had L knee x-ray, do not see results.   COGNITION:  Overall cognitive status: Within functional limits for tasks assessed     PALPATION:   LUMBAR ROM:  Hip ROM: Mild limitation for flex and rotation  Active  AROM  06/28/2021  Flexion Mod/sig limitation  Extension Sig limitation  Right lateral flexion Mod limitation  Left lateral flexion Mod limitation   Right rotation   Left rotation    (Blank rows = not tested)   LE MMT:  Hips: 4/5,   GAIT: Distance walked: 100 Assistive device utilized: Single point cane and None Level of assistance: Modified independence Comments: foot drop on L, causing increased hip flexion with swing , inability for heel strike with initial contact.     TODAY'S TREATMENT   07/21/21:  Therapeutic Exercise: Aerobic: Supine:  Quad sets x 15 on L;  Supine march x 20 ; Bridging x 10; Heel slides x 10 on L; Manually resisted leg press(ext) for L x 10;  Seated:  sit to stand (higher mat table) x 10  Standing:  Stretches: pelvic tilts x15;   Neuromuscular Re-education: Manual Therapy: long leg distraction for bil LE, for lumbar pump;   Self Care:  07/14/21: Therapeutic Exercise:  Aerobic: Supine:  Quad sets x 15 on L;  Supine march x 15 ; Bridging x 15;  Seated:    sit to stand (higher mat table) x 10 with practice for equal weight bearing bil;  Standing: L/R weight shifts (therapist blocking L knee) x 20;  Stretches: pelvic tilts x15;  SKTC 30 sec x 3 bil; LTR x 15;  Neuromuscular Re-education: Manual Therapy: long leg distraction for bil LE, for lumbar pump; Lumbar PA mobs;  Self Care:    PATIENT EDUCATION:  Education details: reviewed HEP Person educated: Patient Education method: Explanation, Demonstration, Tactile cues, Verbal cues, and  Handouts Education comprehension: verbalized understanding, returned demonstration, verbal cues required, tactile cues required, and needs further education   HOME EXERCISE PROGRAM: Access Code: [email or web address]    ASSESSMENT:  CLINICAL IMPRESSION:  07/21/21:  Pt challenged with exercises today due to weakness in LE. Will benefit from continued strengthening.  Plan to progress strength as tolerated, and work on balance/gait/safety.     Eval: Pt presents with primary complaint of ongoing pain and deficits in back. He has had minimal relief from surgery and injections, and continues to have pain into bil glutes and LEs. He has severe weakness noted in L quad as well as L DF, which is causing foot drop. He has poor gait pattern due to foot drop, and may benefit from evaluation for AFO. He has decreased ability and tolerance for standing and walking activity, due to pain and weakness. He has had 1 recent fall from leg giving out on him. Pt to benefit from skilled PT to improve deficits and pain.    OBJECTIVE IMPAIRMENTS Abnormal gait, decreased activity tolerance, decreased balance, decreased coordination, decreased endurance, decreased knowledge of use of DME, decreased mobility, difficulty walking, decreased ROM, decreased strength, decreased safety awareness, improper body mechanics, and pain.   ACTIVITY LIMITATIONS cleaning, community activity, driving, yard work, and shopping.  PERSONAL FACTORS 1-2 comorbidities: stroke, A-fib,  back surgery,   are also affecting patient's functional outcome.    REHAB POTENTIAL: Fair    CLINICAL DECISION MAKING: Evolving/moderate complexity  EVALUATION COMPLEXITY: Moderate   GOALS: Goals reviewed with patient? Yes  SHORT TERM GOALS: Target date: 07/12/21   Pt to be independent with initial HEP  Goal status: INITIAL     LONG TERM GOALS: Target date: 08/09/2021    Pt to be independent with final HEP  Goal status: INITIAL  2.  Pt to demo  improved strength of L knee and hip to at least 4/5 for extension   Goal status: INITIAL  3.  Pt to report decreased pain in back and LEs to 3/10 with activity.   Goal status: INITIAL   4.  Pt to demo ability for safe ambulation up to .25 mi, to improve ability for community activity and shopping.   Goal status: INITIAL     PLAN: PT FREQUENCY: 2x/week  PT DURATION: 6 weeks  PLANNED INTERVENTIONS: Therapeutic exercises, Therapeutic activity, Neuromuscular re-education, Balance training, Gait training, Patient/Family education, Joint manipulation, Joint mobilization, Stair training, DME instructions, Dry Needling, Electrical stimulation, Spinal manipulation, Spinal mobilization, Cryotherapy, Moist heat, Taping, Traction, Ionotophoresis '4mg'$ /ml Dexamethasone, and Manual therapy.  PLAN FOR NEXT SESSION: issue HEP, teach flexion stretches, lumbar distraction, LE strength, quad and hip strength.   Lyndee Hensen, PT, DPT 12:06 PM  07/21/21

## 2021-07-23 ENCOUNTER — Ambulatory Visit
Admission: RE | Admit: 2021-07-23 | Discharge: 2021-07-23 | Disposition: A | Discharge status: Home / Self Care | Payer: Medicare HMO | Source: Ambulatory Visit | Attending: Family Medicine | Admitting: Family Medicine

## 2021-07-23 DIAGNOSIS — M47817 Spondylosis without myelopathy or radiculopathy, lumbosacral region: Secondary | ICD-10-CM | POA: Diagnosis not present

## 2021-07-23 DIAGNOSIS — R29898 Other symptoms and signs involving the musculoskeletal system: Secondary | ICD-10-CM

## 2021-07-23 DIAGNOSIS — M5416 Radiculopathy, lumbar region: Secondary | ICD-10-CM

## 2021-07-23 IMAGING — XA Imaging study
2 series · 2 of 2 positions shown · non-contrast
Comparison: none

CLINICAL DATA: Lumbosacral spondylosis without myelopathy. Symptoms
are predominantly low back pain with radiation into the left leg.
Patient presents for left L4-L5 epidural steroid injection.

[Series 1: ortho standard · 1 of 1 slices shown (1 of 2)]
[im 1/1]
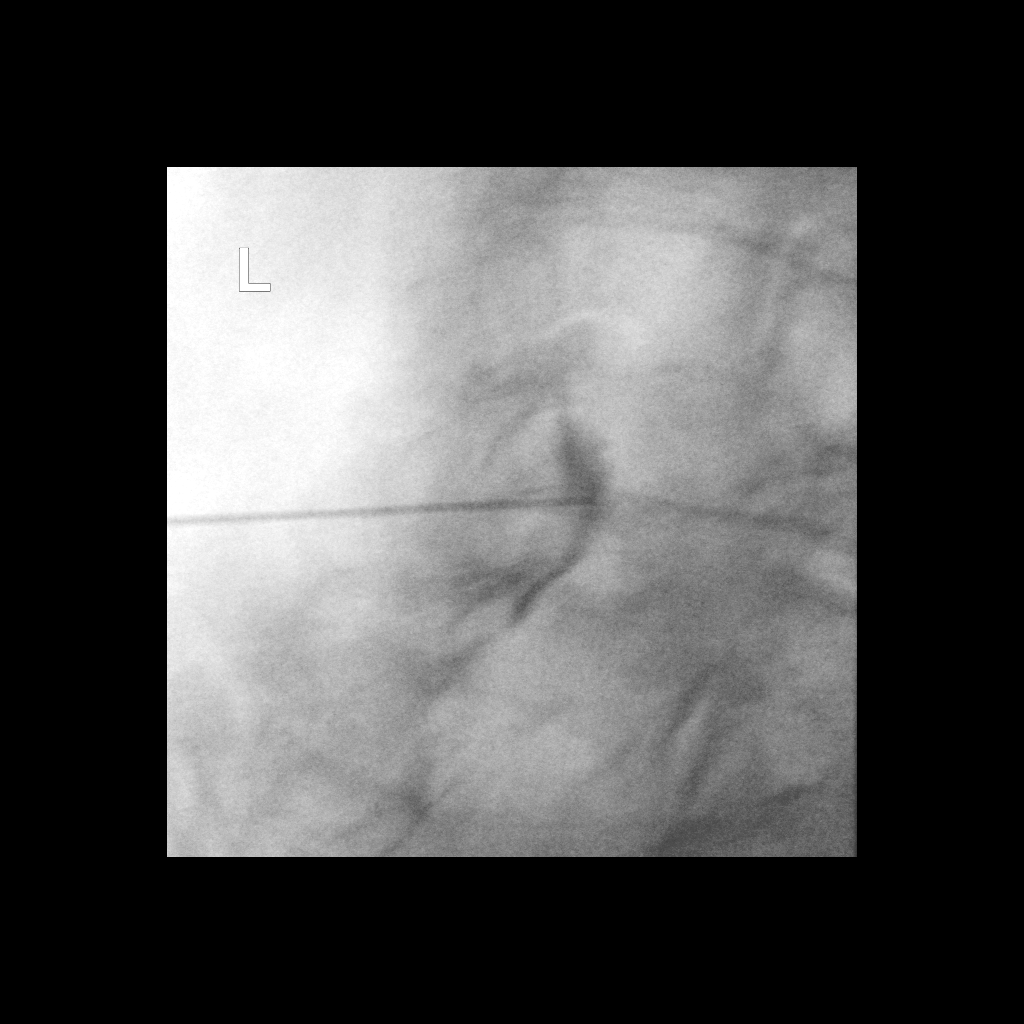

[Series 2: ortho standard · 1 of 1 slices shown (2 of 2)]
[im 1/1]
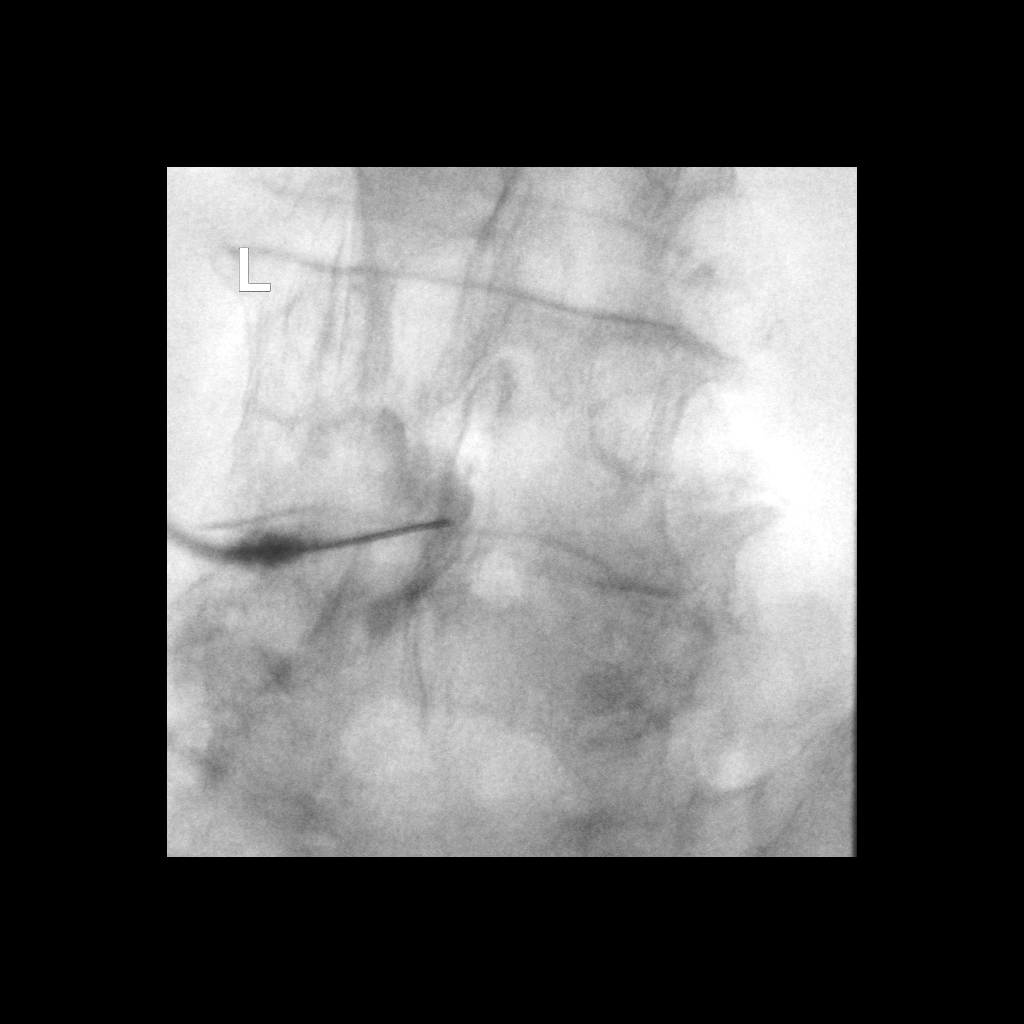

[2 of 2 positions shown; findings below may reference images not displayed]

FLUOROSCOPY:
Radiation Exposure Index (as provided by the fluoroscopic device):
4.1 mGy Kerma

PROCEDURE:
The procedure, risks, benefits, and alternatives were explained to
the patient. Questions regarding the procedure were encouraged and
answered. The patient understands and consents to the procedure.

LUMBAR EPIDURAL INJECTION:

An interlaminar approach was performed on left at L4-L5. The
overlying skin was cleansed and anesthetized. A 20 gauge epidural
needle was advanced using loss-of-resistance technique.

DIAGNOSTIC EPIDURAL INJECTION:

Injection of Isovue-M 200 shows a good epidural pattern with spread
above and below the level of needle placement, primarily on the left
no vascular opacification is seen.

THERAPEUTIC EPIDURAL INJECTION:

80 mg of Depo-Medrol mixed with L4-L5 were instilled. The procedure
was well-tolerated, and the patient was discharged thirty minutes
following the injection in good condition.

COMPLICATIONS:
None.
IMPRESSION: Technically successful epidural injection on the left L4-L5.

## 2021-07-23 MED ORDER — IOPAMIDOL (ISOVUE-M 200) INJECTION 41%
1.0000 mL | Freq: Once | INTRAMUSCULAR | Status: AC
  Administered 2021-07-23: 1 mL via EPIDURAL

## 2021-07-23 MED ORDER — METHYLPREDNISOLONE ACETATE 40 MG/ML INJ SUSP (RADIOLOG
80.0000 mg | Freq: Once | INTRAMUSCULAR | Status: AC
  Administered 2021-07-23: 80 mg via EPIDURAL

## 2021-07-23 NOTE — Discharge Instructions (Signed)

## 2021-07-26 ENCOUNTER — Telehealth: Payer: Self-pay | Admitting: Pharmacist

## 2021-07-26 ENCOUNTER — Telehealth: Payer: Self-pay | Admitting: Family Medicine

## 2021-07-26 NOTE — Progress Notes (Signed)
Chronic Care Management Pharmacy Assistant   Name: Masen Luallen  MRN: [id number] DOB: 1940-03-12   Reason for Encounter: Hypertension Adherence Call    Recent office visits:  07/06/2021 [facility] (PCP) Vivi Barrack, MD; no medication changes indicated.  Recent consult visits:  None since last CPP visit  Hospital visits:  None since last CPP visit  Medications: Outpatient Encounter Medications as of 07/26/2021  Medication Sig   acetaminophen (ACETAMINOPHEN 8 HOUR) 650 MG CR tablet Take 1 tablet (650 mg total) by mouth every 8 (eight) hours as needed for pain.   apixaban (ELIQUIS) 5 MG TABS tablet Take 1 tablet (5 mg total) by mouth 2 (two) times daily.   atenolol (TENORMIN) 25 MG tablet Take 1 tablet (25 mg total) by mouth daily.   atorvastatin (LIPITOR) 40 MG tablet TAKE 1/2 TABLET BY MOUTH DAILY   Cyanocobalamin (VITAMIN B-12) 1000 MCG SUBL Place 1 tablet (1,000 mcg total) under the tongue daily.   furosemide (LASIX) 20 MG tablet Take 1 tablet (20 mg total) by mouth daily.   hydroxypropyl methylcellulose / hypromellose (ISOPTO TEARS / GONIOVISC) 2.5 % ophthalmic solution Place 1 drop into both eyes 2 (two) times daily as needed for dry eyes.   nitroGLYCERIN (NITROSTAT) 0.4 MG SL tablet Dissolve 1 tablet under the tongue every 5 minutes as needed   oxymetazoline (AFRIN) 0.05 % nasal spray Place 2 sprays into both nostrils at bedtime as needed for congestion.   No facility-administered encounter medications on file as of 07/26/2021.   Reviewed chart prior to disease state call. Spoke with patient regarding BP  Recent Office Vitals: BP Readings from Last 3 Encounters:  07/23/21 (!) 164/94  07/13/21 (!) 156/82  07/06/21 139/88   Pulse Readings from Last 3 Encounters:  07/23/21 72  07/13/21 71  07/06/21 77    Wt Readings from Last 3 Encounters:  07/13/21 211 lb (95.7 kg)  07/06/21 213 lb 6.4 oz (96.8 kg)  06/23/21 218 lb (98.9 kg)     Kidney Function Lab  Results  Component Value Date/Time   CREATININE 0.80 03/15/2021 02:18 PM   CREATININE 0.80 11/17/2020 02:37 PM   CREATININE 0.87 10/02/2020 04:26 PM   CREATININE 0.84 09/30/2019 01:49 PM   GFR 78.53 07/20/2020 02:50 PM   GFRNONAA >60 03/15/2021 02:18 PM   GFRAA 97 12/03/2019 03:09 PM       Latest Ref Rng & Units 03/15/2021    2:18 PM 11/17/2020    2:37 PM 10/16/2020    7:21 PM  BMP  Glucose 70 - 99 mg/dL 99  102  95   BUN 8 - 23 mg/dL '17  16  19   '$ Creatinine 0.61 - 1.24 mg/dL 0.80  0.80  0.78   BUN/Creat Ratio 10 - 24  20    Sodium 135 - 145 mmol/L 142  141  137   Potassium 3.5 - 5.1 mmol/L 4.6  4.6  4.1   Chloride 98 - 111 mmol/L 103  105  103   CO2 22 - 32 mmol/L '29  21  25   '$ Calcium 8.9 - 10.3 mg/dL 9.6  9.7  9.6     Current antihypertensive regimen:  Atenolol 25 mg daily Furosemide 20 mg daily  How often are you checking your Blood Pressure? infrequently  Current home BP readings: None to report  What recent interventions/DTPs have been made by any provider to improve Blood Pressure control since last CPP Visit: No recent interventions or DTPs.  Any recent hospitalizations or ED visits since last visit with CPP? No  What diet changes have been made to improve Blood Pressure Control?  Patient states he tries to eat a healthy diet.  What exercise is being done to improve your Blood Pressure Control?  Patient states he is unable to exercise due to pain. He states he believes his pain also makes his blood pressure go up sometimes.  Adherence Review: Is the patient currently on ACE/ARB medication? No Does the patient have >5 day gap between last estimated fill dates? No   Care Gaps: Medicare Annual Wellness: Last AWV 04/12/2019 Hemoglobin A1C: 5.9% on 07/20/2020 Colonoscopy: Aged out  Patient scheduled AWV for 08/05/2021 at 3 pm.  Future Appointments  Date Time Provider [place]  07/28/2021  1:00 PM Lyndee Hensen, PT [facility] None  08/04/2021  2:15 PM  Gardiner Barefoot, DPM [facility]  08/05/2021  3:00 PM [facility] HEALTH COACH [facility]  09/09/2021  1:00 PM Richardo Priest, MD [facility] None  01/04/2022  1:30 PM [facility] CCM PHARMACIST [facility]   Star Rating Drugs: Atorvastatin 40 mg last filled 05/11/2021 90 DS  April D Calhoun, [place] Pharmacist Assistant [phone]

## 2021-07-26 NOTE — Telephone Encounter (Signed)
Copied from [place] [id number]. Topic: Medicare AWV >> Jul 26, 2021  2:44 PM Devoria Glassing wrote: Reason for CRM: Left message for patient to schedule Annual Wellness Visit.  Please schedule with Nurse Health Advisor Charlott Rakes, RN at [facility].  Please call [phone] ask for [facility]

## 2021-07-28 ENCOUNTER — Encounter: Payer: Self-pay | Admitting: Physical Therapy

## 2021-07-28 ENCOUNTER — Ambulatory Visit (INDEPENDENT_AMBULATORY_CARE_PROVIDER_SITE_OTHER): Payer: Medicare HMO | Admitting: Physical Therapy

## 2021-07-28 DIAGNOSIS — M5459 Other low back pain: Secondary | ICD-10-CM

## 2021-07-28 DIAGNOSIS — M6281 Muscle weakness (generalized): Secondary | ICD-10-CM | POA: Diagnosis not present

## 2021-07-28 NOTE — Therapy (Signed)
OUTPATIENT PHYSICAL THERAPY TREATMENT    Patient Name: William Norman MRN: [id number] DOB:01-Apr-1940, 81 y.o., male Today's Date: 07/21/2021       Past Medical History:  Diagnosis Date   BPH (benign prostatic hypertrophy)    BPH associated with nocturia 08/29/2013   BPH with obstruction/lower urinary tract symptoms 08/14/2019   CAD (coronary artery disease)    CAD (coronary artery disease), native coronary artery    PTCA of RCA 1986, PTCA of LAD 1992,  Negative treadmill Cardiolite 2011    Chronic dermatitis of hands 10/12/2015   Chronic venous insufficiency    Coagulation disorder ([place]) 10/26/2018   DVT (deep venous thrombosis) ([place])    Dyslipidemia    Dysrhythmia    Eczema    Erectile dysfunction 07/11/2012   Essential hypertension    GERD 08/04/2006       GERD (gastroesophageal reflux disease)    Hyperglycemia 07/08/2019   Hyperlipidemia    Hypertension    Incomplete emptying of bladder 08/14/2019   Long term current use of anticoagulant therapy    Lumbar disc disease    Overweight (BMI 25.0-29.9) 06/18/2015   Persistent atrial fibrillation ([place]) 04/10/2018   Personal history of DVT (deep vein thrombosis)    Initially in 1998 with recurrence in 2002 now on chronic warfarin    Soft tissue lesion of foot 08/27/2015   Stroke [facility])    Urinary tract infection with hematuria 08/14/2019   Past Surgical History:  Procedure Laterality Date   APPLICATION OF WOUND VAC  09/02/2020   Procedure: APPLICATION OF WOUND VAC;  Surgeon: Donnie Mesa, MD;  Location: [place];  Service: General;;   CARDIAC CATHETERIZATION     CARPAL TUNNEL RELEASE  04/21/2011   Procedure: CARPAL TUNNEL RELEASE;  Surgeon: Tennis Must, MD;  Location: [place];  Service: Orthopedics;  Laterality: Left;   CARPAL TUNNEL RELEASE  05/23/2011   Procedure: CARPAL TUNNEL RELEASE;  Surgeon: Tennis Must, MD;  Location: [place];  Service: Orthopedics;  Laterality: Right;    COLON SURGERY  2010 and 2011 colostomy reversal   CORONARY ANGIOPLASTY     EYE SURGERY     repair of macular hole   LAPAROTOMY N/A 09/02/2020   Procedure: EXPLORATORY LAPAROTOMY, LYSIS OF ADHESIONS ,BOWEL RESECTION;  Surgeon: Donnie Mesa, MD;  Location: [place];  Service: General;  Laterality: N/A;   LUMBAR LAMINECTOMY/DECOMPRESSION MICRODISCECTOMY Left 03/18/2021   Procedure: LAMINOTOMY, FORAMINOTOMY, LEFT LUMBAR THREE-FOUR;  Surgeon: Consuella Lose, MD;  Location: [place];  Service: Neurosurgery;  Laterality: Left;   NASAL SEPTUM SURGERY     precutaneous transluminal coronary angioplasty     right hernia     TONSILLECTOMY AND ADENOIDECTOMY     Patient Active Problem List   Diagnosis Date Noted   Osteoarthritis 11/20/2020   Constipation 10/02/2020   Protein-calorie malnutrition, severe 09/03/2020   SBO (small bowel obstruction) ([place]) 08/28/2020   AAA (abdominal aortic aneurysm) ([place]) 03/06/2020   Porokeratosis 02/25/2020   History of ischemic stroke 02/16/2020   Eczema    DVT (deep venous thrombosis) ([facility])    CAD (coronary artery disease)    Recurrent UTI 08/14/2019   Hyperglycemia 07/08/2019   Persistent atrial fibrillation ([place]) 04/10/2018   Chronic dermatitis of hands 10/12/2015   Soft tissue lesion of foot 08/27/2015   Stroke ([place]) 06/18/2015   BPH associated with nocturia 08/29/2013   CAD (coronary artery disease), native coronary artery    Long term current use of anticoagulant therapy  Personal history of DVT (deep vein thrombosis)    Chronic venous insufficiency    Erectile dysfunction 07/11/2012   Lumbar radiculopathy    GERD 08/04/2006   Dyslipidemia    Essential hypertension     PCP: Dimas Chyle  REFERRING PROVIDER: Lynne Leader  REFERRING DIAG: Lumbar Radiculopathy  THERAPY DIAG:  No diagnosis found.  ONSET DATE:   SUBJECTIVE:                                                                                                                                                                                            SUBJECTIVE STATEMENT: Pt had injection in back. States not much change, but reports decreased pain levels in back and LE. Still feels pain in foot. And still bothersome weakness in leg/knee. States more instances of L knee giving way and feeling unstable in last few days.    Eval: Pt reports ongoing pain and difficulty with back and legs. He had surgery in feb 2022, states minimal relief. He continues to have bothersome pain in back, glutes, and LEs. He notes worsening weakness in legs.  He does have SPC, not using all the time. He did have one recent fall where his leg gave out on him.  Taking meds for pain/ tylenol, only getting some relief Pain in L low back,  First think in AM pain is the worst, R>L,   L foot drop, Radicular pain in L leg into toes.  Also has pain in L knee, with weakness. Did have injection recently. States sometimes has no pain, but has sharp pains at times with turning and walking,  and feels like its going to give way.  Full flight of stairs at home to bedroom,    PERTINENT HISTORY:  Stroke, CAD, A-fib, Previous back surgery Feb 2022  PAIN:  Are you having pain? Yes: NPRS scale: 2-3 /10 Pain location: Back, into glutes, hips, legs  Pain description: radiating  Aggravating factors: standing, walking,  Relieving factors: sitting   Are you having pain? Yes: NPRS scale: 8/10 Pain location: L knee  Pain description: Weakness, feels like its doing to give out, has had fall from this.  Aggravating factors: Only if he turns wrong on it.  Relieving factors: none    PRECAUTIONS: Fall   FALLS:  Has patient fallen in last 6 months? Yes. Number of falls 1 L knee gave out, was able to get himself up, no injury   PLOF: Independent  PATIENT GOALS   Decreased pain, leg strength.    OBJECTIVE:   DIAGNOSTIC FINDINGS: states he had L knee x-ray, do not see results.  COGNITION:  Overall cognitive  status: Within functional limits for tasks assessed     PALPATION:   LUMBAR ROM:  Hip ROM: Mild limitation for flex and rotation  Active  AROM  06/28/2021  Flexion Mod/sig limitation  Extension Sig limitation  Right lateral flexion Mod limitation  Left lateral flexion Mod limitation   Right rotation   Left rotation    (Blank rows = not tested)   LE MMT:  Hips: 4/5,   GAIT: Distance walked: 100 Assistive device utilized: Single point cane and None Level of assistance: Modified independence Comments: foot drop on L, causing increased hip flexion with swing , inability for heel strike with initial contact.     TODAY'S TREATMENT   07/28/21: Therapeutic Exercise: Aerobic: Supine:  Quad sets x 15 on L;  SAQ- with manual assist x 15 on L;  Supine march with TA x 20 ;  Heel slides x 10 on L; anually resisted leg press(ext) for L x 15;  Seated:  sit to stand (higher mat table) x 10 , requires use of hands, due to weakness and pain in L knee.  Standing: L/R weight shifts x 20, staggered stance weight shifts x 10, L reach with L weight shift x 15 on L; Step ups 4 in , 2 hand rails, x 6 on L;  Stretches: pelvic tilts x15;   Neuromuscular Re-education: Manual Therapy: long leg distraction for bil LE, for lumbar pump;   Self Care:  07/14/21: Therapeutic Exercise:  Aerobic: Supine:  Quad sets x 15 on L;  Supine march x 15 ; Bridging x 15;  Seated:    sit to stand (higher mat table) x 10 with practice for equal weight bearing bil;  Standing: L/R weight shifts (therapist blocking L knee) x 20;  Stretches: pelvic tilts x15;  SKTC 30 sec x 3 bil; LTR x 15;  Neuromuscular Re-education: Manual Therapy: long leg distraction for bil LE, for lumbar pump; Lumbar PA mobs;  Self Care:    PATIENT EDUCATION:  Education details: reviewed HEP Person educated: Patient Education method: Explanation, Demonstration, Tactile cues, Verbal cues, and Handouts Education comprehension: verbalized  understanding, returned demonstration, verbal cues required, tactile cues required, and needs further education   HOME EXERCISE PROGRAM: Access Code: [email or web address]    ASSESSMENT:  CLINICAL IMPRESSION: 07/28/21:  Pt able to do partial SAQ today, with less pain than previous sessions, still unable to do LAQ due to pain. Noted shift away from L LE with sit to stand due to pain in knee. Pt challenged with exercises today due to weakness in LE. Will benefit from continued strengthening.  Discussed importance of strengthening for LE and with HEP, as pt admits to not doing much at home.   Eval: Pt presents with primary complaint of ongoing pain and deficits in back. He has had minimal relief from surgery and injections, and continues to have pain into bil glutes and LEs. He has severe weakness noted in L quad as well as L DF, which is causing foot drop. He has poor gait pattern due to foot drop, and may benefit from evaluation for AFO. He has decreased ability and tolerance for standing and walking activity, due to pain and weakness. He has had 1 recent fall from leg giving out on him. Pt to benefit from skilled PT to improve deficits and pain.    OBJECTIVE IMPAIRMENTS Abnormal gait, decreased activity tolerance, decreased balance, decreased coordination, decreased endurance, decreased knowledge of use of DME, decreased mobility, difficulty  walking, decreased ROM, decreased strength, decreased safety awareness, improper body mechanics, and pain.   ACTIVITY LIMITATIONS cleaning, community activity, driving, yard work, and shopping.   PERSONAL FACTORS 1-2 comorbidities: stroke, A-fib,  back surgery,   are also affecting patient's functional outcome.    REHAB POTENTIAL: Fair    CLINICAL DECISION MAKING: Evolving/moderate complexity  EVALUATION COMPLEXITY: Moderate   GOALS: Goals reviewed with patient? Yes  SHORT TERM GOALS: Target date: 07/12/21   Pt to be independent with initial HEP  Goal  status: INITIAL     LONG TERM GOALS: Target date: 08/09/2021    Pt to be independent with final HEP  Goal status: INITIAL  2.  Pt to demo improved strength of L knee and hip to at least 4/5 for extension   Goal status: INITIAL  3.  Pt to report decreased pain in back and LEs to 3/10 with activity.   Goal status: INITIAL   4.  Pt to demo ability for safe ambulation up to .25 mi, to improve ability for community activity and shopping.   Goal status: INITIAL    PLAN: PT FREQUENCY: 2x/week  PT DURATION: 6 weeks  PLANNED INTERVENTIONS: Therapeutic exercises, Therapeutic activity, Neuromuscular re-education, Balance training, Gait training, Patient/Family education, Joint manipulation, Joint mobilization, Stair training, DME instructions, Dry Needling, Electrical stimulation, Spinal manipulation, Spinal mobilization, Cryotherapy, Moist heat, Taping, Traction, Ionotophoresis '4mg'$ /ml Dexamethasone, and Manual therapy.  PLAN FOR NEXT SESSION: issue HEP, teach flexion stretches, lumbar distraction, LE strength, quad and hip strength.   Lyndee Hensen, PT, DPT 1:09 PM  07/28/21

## 2021-08-03 ENCOUNTER — Telehealth: Payer: Self-pay | Admitting: Family Medicine

## 2021-08-03 ENCOUNTER — Ambulatory Visit (INDEPENDENT_AMBULATORY_CARE_PROVIDER_SITE_OTHER): Payer: Medicare HMO | Admitting: Physical Therapy

## 2021-08-03 ENCOUNTER — Encounter: Payer: Self-pay | Admitting: Physical Therapy

## 2021-08-03 ENCOUNTER — Telehealth: Payer: Self-pay | Admitting: Physical Therapy

## 2021-08-03 DIAGNOSIS — M6281 Muscle weakness (generalized): Secondary | ICD-10-CM | POA: Diagnosis not present

## 2021-08-03 DIAGNOSIS — M21372 Foot drop, left foot: Secondary | ICD-10-CM

## 2021-08-03 DIAGNOSIS — M5459 Other low back pain: Secondary | ICD-10-CM | POA: Diagnosis not present

## 2021-08-03 NOTE — Therapy (Signed)
OUTPATIENT PHYSICAL THERAPY TREATMENT    Patient Name: William Norman MRN: [id number] DOB:1940-12-04, 81 y.o., male Today's Date: 08/03/2021   PT End of Session - 08/03/21 1315     Visit Number 6    Number of Visits 12    Date for PT Re-Evaluation 08/09/21    Authorization Type [employer]    PT Start Time 1304    PT Stop Time 1345    PT Time Calculation (min) 41 min    Activity Tolerance Patient tolerated treatment well    Behavior During Therapy [facility] for tasks assessed/performed                Past Medical History:  Diagnosis Date   BPH (benign prostatic hypertrophy)    BPH associated with nocturia 08/29/2013   BPH with obstruction/lower urinary tract symptoms 08/14/2019   CAD (coronary artery disease)    CAD (coronary artery disease), native coronary artery    PTCA of RCA 1986, PTCA of LAD 1992,  Negative treadmill Cardiolite 2011    Chronic dermatitis of hands 10/12/2015   Chronic venous insufficiency    Coagulation disorder ([place]) 10/26/2018   DVT (deep venous thrombosis) ([place])    Dyslipidemia    Dysrhythmia    Eczema    Erectile dysfunction 07/11/2012   Essential hypertension    GERD 08/04/2006       GERD (gastroesophageal reflux disease)    Hyperglycemia 07/08/2019   Hyperlipidemia    Hypertension    Incomplete emptying of bladder 08/14/2019   Long term current use of anticoagulant therapy    Lumbar disc disease    Overweight (BMI 25.0-29.9) 06/18/2015   Persistent atrial fibrillation ([place]) 04/10/2018   Personal history of DVT (deep vein thrombosis)    Initially in 1998 with recurrence in 2002 now on chronic warfarin    Soft tissue lesion of foot 08/27/2015   Stroke [facility])    Urinary tract infection with hematuria 08/14/2019   Past Surgical History:  Procedure Laterality Date   APPLICATION OF WOUND VAC  09/02/2020   Procedure: APPLICATION OF WOUND VAC;  Surgeon: Donnie Mesa, MD;  Location: [place];  Service: General;;   CARDIAC  CATHETERIZATION     CARPAL TUNNEL RELEASE  04/21/2011   Procedure: CARPAL TUNNEL RELEASE;  Surgeon: Tennis Must, MD;  Location: [place];  Service: Orthopedics;  Laterality: Left;   CARPAL TUNNEL RELEASE  05/23/2011   Procedure: CARPAL TUNNEL RELEASE;  Surgeon: Tennis Must, MD;  Location: [place];  Service: Orthopedics;  Laterality: Right;   COLON SURGERY  2010 and 2011 colostomy reversal   CORONARY ANGIOPLASTY     EYE SURGERY     repair of macular hole   LAPAROTOMY N/A 09/02/2020   Procedure: EXPLORATORY LAPAROTOMY, LYSIS OF ADHESIONS ,BOWEL RESECTION;  Surgeon: Donnie Mesa, MD;  Location: [place];  Service: General;  Laterality: N/A;   LUMBAR LAMINECTOMY/DECOMPRESSION MICRODISCECTOMY Left 03/18/2021   Procedure: LAMINOTOMY, FORAMINOTOMY, LEFT LUMBAR THREE-FOUR;  Surgeon: Consuella Lose, MD;  Location: [place];  Service: Neurosurgery;  Laterality: Left;   NASAL SEPTUM SURGERY     precutaneous transluminal coronary angioplasty     right hernia     TONSILLECTOMY AND ADENOIDECTOMY     Patient Active Problem List   Diagnosis Date Noted   Osteoarthritis 11/20/2020   Constipation 10/02/2020   Protein-calorie malnutrition, severe 09/03/2020   SBO (small bowel obstruction) ([place]) 08/28/2020   AAA (abdominal aortic aneurysm) ([place]) 03/06/2020   Porokeratosis  02/25/2020   History of ischemic stroke 02/16/2020   Eczema    DVT (deep venous thrombosis) ([facility])    CAD (coronary artery disease)    Recurrent UTI 08/14/2019   Hyperglycemia 07/08/2019   Persistent atrial fibrillation ([place]) 04/10/2018   Chronic dermatitis of hands 10/12/2015   Soft tissue lesion of foot 08/27/2015   Stroke ([place]) 06/18/2015   BPH associated with nocturia 08/29/2013   CAD (coronary artery disease), native coronary artery    Long term current use of anticoagulant therapy    Personal history of DVT (deep vein thrombosis)    Chronic venous insufficiency    Erectile dysfunction  07/11/2012   Lumbar radiculopathy    GERD 08/04/2006   Dyslipidemia    Essential hypertension     PCP: Dimas Chyle  REFERRING PROVIDER: Lynne Leader  REFERRING DIAG: Lumbar Radiculopathy  THERAPY DIAG:  Other low back pain  Muscle weakness (generalized)  ONSET DATE:   SUBJECTIVE:                                                                                                                                                                                           SUBJECTIVE STATEMENT: Pt reports decreased pain levels in back and LE. Still feels pain in lower leg into foot. And still bothersome weakness in leg/knee. Did have fall due to knee giving way.     Eval: Pt reports ongoing pain and difficulty with back and legs. He had surgery in feb 2022, states minimal relief. He continues to have bothersome pain in back, glutes, and LEs. He notes worsening weakness in legs.  He does have SPC, not using all the time. He did have one recent fall where his leg gave out on him.  Taking meds for pain/ tylenol, only getting some relief Pain in L low back,  First think in AM pain is the worst, R>L,   L foot drop, Radicular pain in L leg into toes.  Also has pain in L knee, with weakness. Did have injection recently. States sometimes has no pain, but has sharp pains at times with turning and walking,  and feels like its going to give way.  Full flight of stairs at home to bedroom,    PERTINENT HISTORY:  Stroke, CAD, A-fib, Previous back surgery Feb 2022  PAIN:  Are you having pain? Yes: NPRS scale: 2-3 /10 Pain location: Back, into glutes, hips, legs  Pain description: radiating  Aggravating factors: standing, walking,  Relieving factors: sitting   Are you having pain? Yes: NPRS scale: 8/10 Pain location: L knee  Pain description: Weakness, feels like its doing to give  out, has had fall from this.  Aggravating factors: Only if he turns wrong on it.  Relieving factors: none     PRECAUTIONS: Fall   FALLS:  Has patient fallen in last 6 months? Yes. Number of falls 1 L knee gave out, was able to get himself up, no injury   PLOF: Independent  PATIENT GOALS   Decreased pain, leg strength.    OBJECTIVE:   DIAGNOSTIC FINDINGS: states he had L knee x-ray, do not see results.   COGNITION:  Overall cognitive status: Within functional limits for tasks assessed     PALPATION:   LUMBAR ROM:  Hip ROM: Mild limitation for flex and rotation  Active  AROM  06/28/2021  Flexion Mod/sig limitation  Extension Sig limitation  Right lateral flexion Mod limitation  Left lateral flexion Mod limitation   Right rotation   Left rotation    (Blank rows = not tested)   LE MMT:  Hips: 4/5,   GAIT: Distance walked: 100 Assistive device utilized: Single point cane and None Level of assistance: Modified independence Comments: foot drop on L, causing increased hip flexion with swing , inability for heel strike with initial contact.     TODAY'S TREATMENT   08/03/21: Therapeutic Exercise: Aerobic: Supine:  Quad sets x 15 on bil ;  SAQ- with manual assist x 10 on L; (pain)   Supine march with TA x 20 ;  Heel slides x 10 on L; anually resisted leg press(ext) for L x 15;  Clams Blue TB x 20; Hip abd slides x 10 (pain and requires assist);  Bridging x 10;  Seated:  sit to stand (higher mat table) x 10 , Standing: hip abd 3x5 on L;  stairs up/down 5 steps x 4 with 1 HR- up with R , down with L.  Stretches:  Neuromuscular Re-education: Manual Therapy:    Self Care:   Previous:  Therapeutic Exercise: Aerobic: Supine:  Quad sets x 15 on L;  Supine march x 15 ; Bridging x 15;  Seated:    sit to stand (higher mat table) x 10 with practice for equal weight bearing bil;  Standing: L/R weight shifts (therapist blocking L knee) x 20;  Stretches: pelvic tilts x15;  SKTC 30 sec x 3 bil; LTR x 15;  Neuromuscular Re-education: Manual Therapy: long leg distraction for bil  LE, for lumbar pump; Lumbar PA mobs;  Self Care:   PATIENT EDUCATION:  Education details: reviewed HEP Person educated: Patient Education method: Explanation, Demonstration, Tactile cues, Verbal cues, and Handouts Education comprehension: verbalized understanding, returned demonstration, verbal cues required, tactile cues required, and needs further education   HOME EXERCISE PROGRAM: Access Code: [email or web address]    ASSESSMENT:  CLINICAL IMPRESSION: 08/03/21:  Pt still very challenged with exercises due to weakness in LE. Has significant pain in L knee with attempts for SAQ or LAQ. Will benefit from continued strengthening.  Discussed importance of strengthening for LE and with HEP, as pt admits to not doing much at home. Also discussed AFO for improving gait mechanics and safety, pt agreeable to this today.   Eval: Pt presents with primary complaint of ongoing pain and deficits in back. He has had minimal relief from surgery and injections, and continues to have pain into bil glutes and LEs. He has severe weakness noted in L quad as well as L DF, which is causing foot drop. He has poor gait pattern due to foot drop, and may benefit from evaluation for AFO. He  has decreased ability and tolerance for standing and walking activity, due to pain and weakness. He has had 1 recent fall from leg giving out on him. Pt to benefit from skilled PT to improve deficits and pain.    OBJECTIVE IMPAIRMENTS Abnormal gait, decreased activity tolerance, decreased balance, decreased coordination, decreased endurance, decreased knowledge of use of DME, decreased mobility, difficulty walking, decreased ROM, decreased strength, decreased safety awareness, improper body mechanics, and pain.   ACTIVITY LIMITATIONS cleaning, community activity, driving, yard work, and shopping.   PERSONAL FACTORS 1-2 comorbidities: stroke, A-fib,  back surgery,   are also affecting patient's functional outcome.    REHAB POTENTIAL: Fair     CLINICAL DECISION MAKING: Evolving/moderate complexity  EVALUATION COMPLEXITY: Moderate   GOALS: Goals reviewed with patient? Yes  SHORT TERM GOALS: Target date: 07/12/21   Pt to be independent with initial HEP  Goal status: INITIAL     LONG TERM GOALS: Target date: 08/09/2021    Pt to be independent with final HEP  Goal status: INITIAL  2.  Pt to demo improved strength of L knee and hip to at least 4/5 for extension   Goal status: INITIAL  3.  Pt to report decreased pain in back and LEs to 3/10 with activity.   Goal status: INITIAL   4.  Pt to demo ability for safe ambulation up to .25 mi, to improve ability for community activity and shopping.   Goal status: INITIAL    PLAN: PT FREQUENCY: 2x/week  PT DURATION: 6 weeks  PLANNED INTERVENTIONS: Therapeutic exercises, Therapeutic activity, Neuromuscular re-education, Balance training, Gait training, Patient/Family education, Joint manipulation, Joint mobilization, Stair training, DME instructions, Dry Needling, Electrical stimulation, Spinal manipulation, Spinal mobilization, Cryotherapy, Moist heat, Taping, Traction, Ionotophoresis '4mg'$ /ml Dexamethasone, and Manual therapy.  PLAN FOR NEXT SESSION:    Lyndee Hensen, PT, DPT 1:16 PM  08/03/21

## 2021-08-03 NOTE — Telephone Encounter (Signed)
Copied from [place] [id number]. Topic: Medicare AWV >> Aug 03, 2021  3:02 PM Devoria Glassing wrote: Reason for CRM: LVM 08/03/21 r/s AWV appt.  New appt date 08/06/21 '@3pm'$ .  Please confirm appt date change.  khc

## 2021-08-03 NOTE — Telephone Encounter (Signed)
Discussed use of AFO with pt today, pt agreeable. AFO would be helpful for gait mechanics and safety. Spoke with Dr. Georgina Snell who agrees. He will place order.   Lyndee Hensen, PT, DPT

## 2021-08-04 ENCOUNTER — Ambulatory Visit: Payer: Medicare HMO | Admitting: Podiatry

## 2021-08-04 ENCOUNTER — Encounter: Payer: Self-pay | Admitting: Podiatry

## 2021-08-04 DIAGNOSIS — B351 Tinea unguium: Secondary | ICD-10-CM | POA: Diagnosis not present

## 2021-08-04 DIAGNOSIS — D689 Coagulation defect, unspecified: Secondary | ICD-10-CM

## 2021-08-04 DIAGNOSIS — M79675 Pain in left toe(s): Secondary | ICD-10-CM | POA: Diagnosis not present

## 2021-08-04 DIAGNOSIS — M79674 Pain in right toe(s): Secondary | ICD-10-CM

## 2021-08-04 DIAGNOSIS — I872 Venous insufficiency (chronic) (peripheral): Secondary | ICD-10-CM

## 2021-08-04 NOTE — Progress Notes (Signed)
This patient returns to my office for at risk foot care.  This patient requires this care by a professional since this patient will be at risk due to having venous insufficiency and coagulation defect.  Patient is taking coumadin.  This patient is unable to cut nails himself since the patient cannot reach his nails.These nails are painful walking and wearing shoes.This patient presents for at risk foot care today. Patient has developed dropfoot left foot from back problem.  General Appearance  Alert, conversant and in no acute stress.  Vascular  Dorsalis pedis and posterior tibial  pulses are palpable  bilaterally.  Capillary return is within normal limits  bilaterally. Temperature is within normal limits  bilaterally.  Neurologic  Senn-Weinstein monofilament wire test within normal limits  bilaterally. Muscle power within normal limits bilaterally.  Nails Thick disfigured discolored nails with subungual debris  from hallux to fifth toes bilaterally. No evidence of bacterial infection or drainage bilaterally.   Orthopedic  No limitations of motion  feet .  No crepitus or effusions noted.  No bony pathology or digital deformities noted.  Skin  normotropic skin  noted bilaterally.  No signs of infections or ulcers noted.   Asymptomatic porokeratosis sub 5th met left foot.  Onychomycosis  Pain in right toes  Pain in left toes  Porokeratosis left foot.  Consent was obtained for treatment procedures.   Mechanical debridement of nails 1-5  bilaterally performed with a nail nipper.  Filed with dremel without incident.    Return office visit   10 weeks                  Told patient to return for periodic foot care and evaluation due to potential at risk complications.   Gardiner Barefoot DPM

## 2021-08-05 ENCOUNTER — Other Ambulatory Visit: Payer: Self-pay | Admitting: Family Medicine

## 2021-08-05 ENCOUNTER — Ambulatory Visit: Payer: Medicare HMO

## 2021-08-06 ENCOUNTER — Ambulatory Visit (INDEPENDENT_AMBULATORY_CARE_PROVIDER_SITE_OTHER): Payer: Medicare HMO

## 2021-08-06 DIAGNOSIS — Z0000 Encounter for general adult medical examination without abnormal findings: Secondary | ICD-10-CM | POA: Diagnosis not present

## 2021-08-06 MED ORDER — AMBULATORY NON FORMULARY MEDICATION: 0 refills | Status: DC

## 2021-08-06 NOTE — Telephone Encounter (Signed)
Prescription for an ankle-foot orthosis ordered today.  We will fax it to [facility] in [place].  They should contact you in the near future to schedule a time to get it created.  Please keep a look out for a phone call from [facility] phone number from [facility] also known as the [facility].  Please contact the patient

## 2021-08-10 ENCOUNTER — Ambulatory Visit (INDEPENDENT_AMBULATORY_CARE_PROVIDER_SITE_OTHER): Payer: Medicare HMO | Admitting: Physical Therapy

## 2021-08-10 ENCOUNTER — Encounter: Payer: Self-pay | Admitting: Physical Therapy

## 2021-08-10 DIAGNOSIS — M21372 Foot drop, left foot: Secondary | ICD-10-CM | POA: Diagnosis not present

## 2021-08-10 DIAGNOSIS — M6281 Muscle weakness (generalized): Secondary | ICD-10-CM | POA: Diagnosis not present

## 2021-08-10 DIAGNOSIS — M5459 Other low back pain: Secondary | ICD-10-CM

## 2021-08-24 ENCOUNTER — Ambulatory Visit (INDEPENDENT_AMBULATORY_CARE_PROVIDER_SITE_OTHER): Payer: Medicare HMO | Admitting: Family Medicine

## 2021-08-24 ENCOUNTER — Encounter: Payer: Self-pay | Admitting: Family Medicine

## 2021-08-24 VITALS — BP 130/70 | HR 72 | Temp 97.2°F | Ht 72.0 in | Wt 209.2 lb

## 2021-08-24 DIAGNOSIS — N390 Urinary tract infection, site not specified: Secondary | ICD-10-CM | POA: Diagnosis not present

## 2021-08-24 DIAGNOSIS — R351 Nocturia: Secondary | ICD-10-CM

## 2021-08-24 DIAGNOSIS — R739 Hyperglycemia, unspecified: Secondary | ICD-10-CM | POA: Diagnosis not present

## 2021-08-24 DIAGNOSIS — F320 Major depressive disorder, single episode, mild: Secondary | ICD-10-CM

## 2021-08-24 DIAGNOSIS — E785 Hyperlipidemia, unspecified: Secondary | ICD-10-CM

## 2021-08-24 DIAGNOSIS — R69 Illness, unspecified: Secondary | ICD-10-CM | POA: Diagnosis not present

## 2021-08-24 DIAGNOSIS — N401 Enlarged prostate with lower urinary tract symptoms: Secondary | ICD-10-CM | POA: Diagnosis not present

## 2021-08-24 DIAGNOSIS — I10 Essential (primary) hypertension: Secondary | ICD-10-CM

## 2021-08-24 NOTE — Assessment & Plan Note (Addendum)
No Si or HI. Had lengthy discussion with patient regarding his mood.  We discussed treatment options including medication referral to therapy.  He declined both of these.  He will let me know if he would like to consider either 1 of these in the future.

## 2021-08-24 NOTE — Assessment & Plan Note (Signed)
Check lipids.  He is on Lipitor 20 mg daily and tolerating well.

## 2021-08-24 NOTE — Assessment & Plan Note (Signed)
Continue management per urology. 

## 2021-08-24 NOTE — Patient Instructions (Signed)
It was very nice to see you today!  Please let me know if you would like a referral to see a psychiatrist or if you would like to start medication for your mood.  We will check blood work today.  I will see back in 6 months.  Come back to see me sooner if needed.  Take care, Dr [name]  PLEASE NOTE:  If you had any lab tests please let us know if you have not heard back within a few days. You may see your results on mychart before we have a chance to review them but we will give you a call once they are reviewed by [place]. If we ordered any referrals today, please let us know if you have not heard from their office within the next week.   Please try these tips to maintain a healthy lifestyle:  Eat at least 3 REAL meals and 1-2 snacks per day.  Aim for no more than 5 hours between eating.  If you eat breakfast, please do so within one hour of getting up.   Each meal should contain half fruits/vegetables, one quarter protein, and one quarter carbs (no bigger than a computer mouse)  Cut down on sweet beverages. This includes juice, soda, and sweet tea.   Drink at least 1 glass of water with each meal and aim for at least 8 glasses per day  Exercise at least 150 minutes every week.

## 2021-08-24 NOTE — Assessment & Plan Note (Signed)
At goal today on atenolol 25 mg daily.

## 2021-08-24 NOTE — Progress Notes (Signed)
   William Norman is a 81 y.o. male who presents today for an office visit.  Assessment/Plan:  Chronic Problems Addressed Today: Essential hypertension At goal today on atenolol 25 mg daily.  Dyslipidemia Check lipids.  He is on Lipitor 20 mg daily and tolerating well.  BPH associated with nocturia Continue management per urology.  Depression, major, single episode, mild ([facility]) No Si or HI. Had lengthy discussion with patient regarding his mood.  We discussed treatment options including medication referral to therapy.  He declined both of these.  He will let me know if he would like to consider either 1 of these in the future.      Subjective:  HPI:  See A/p for status of chronic conditions.  He has been feeling down for the past year. He attributes this mostly to his declining physical health. He is frustrated that he has not made much progress with regaining his strength. He has been working with physical therapy which has not helped much. His balance is still poor which is concerning to him and limits his activity levels.  No SI or HI.  See PHQ-9.  He is also concerned about having UTI. This has been going on for the last month. No fevers or chills. He is having pain with urination        Objective:  Physical Exam: BP 130/70   Pulse 72   Temp (!) 97.2 F (36.2 C) (Temporal)   Ht 6' (1.829 m)   Wt 209 lb 3.2 oz (94.9 kg)   SpO2 95%   BMI 28.37 kg/m   Gen: No acute distress, resting comfortably CV: Regular rate and rhythm with no murmurs appreciated Pulm: Normal work of breathing, clear to auscultation bilaterally with no crackles, wheezes, or rhonchi Neuro: Grossly normal, moves all extremities Psych: Normal affect and thought content      Yerlin Gasparyan M. [name], MD 08/24/2021 2:41 PM

## 2021-08-25 LAB — CBC
HCT: 45.2 % (ref 39.0–52.0)
Hemoglobin: 14.9 g/dL (ref 13.0–17.0)
MCHC: 33 g/dL (ref 30.0–36.0)
MCV: 90.1 fl (ref 78.0–100.0)
Platelets: 186 10*3/uL (ref 150.0–400.0)
RBC: 5.01 Mil/uL (ref 4.22–5.81)
RDW: 16.1 % — ABNORMAL HIGH (ref 11.5–15.5)
WBC: 7.1 10*3/uL (ref 4.0–10.5)

## 2021-08-25 LAB — COMPREHENSIVE METABOLIC PANEL
ALT: 22 U/L (ref 0–53)
AST: 28 U/L (ref 0–37)
Albumin: 4.1 g/dL (ref 3.5–5.2)
Alkaline Phosphatase: 54 U/L (ref 39–117)
BUN: 20 mg/dL (ref 6–23)
CO2: 30 mEq/L (ref 19–32)
Calcium: 10.1 mg/dL (ref 8.4–10.5)
Chloride: 104 mEq/L (ref 96–112)
Creatinine, Ser: 0.91 mg/dL (ref 0.40–1.50)
GFR: 78.96 mL/min (ref >60.00)
Glucose, Bld: 89 mg/dL (ref 70–99)
Potassium: 5.3 mEq/L — ABNORMAL HIGH (ref 3.5–5.1)
Sodium: 139 mEq/L (ref 135–145)
Total Bilirubin: 1 mg/dL (ref 0.2–1.2)
Total Protein: 6.9 g/dL (ref 6.0–8.3)

## 2021-08-25 LAB — TSH: TSH: 2.62 u[IU]/mL (ref 0.35–5.50)

## 2021-08-25 LAB — LIPID PANEL
Cholesterol: 123 mg/dL (ref 0–200)
HDL: 53.2 mg/dL (ref >39.00)
LDL Cholesterol: 55 mg/dL (ref 0–99)
NonHDL: 69.45
Total CHOL/HDL Ratio: 2
Triglycerides: 72 mg/dL (ref 0.0–149.0)
VLDL: 14.4 mg/dL (ref 0.0–40.0)

## 2021-08-25 LAB — HEMOGLOBIN A1C: Hgb A1c MFr Bld: 5.9 % (ref 4.6–6.5)

## 2021-08-26 ENCOUNTER — Encounter: Payer: Self-pay | Admitting: Physical Therapy

## 2021-08-26 ENCOUNTER — Ambulatory Visit (INDEPENDENT_AMBULATORY_CARE_PROVIDER_SITE_OTHER): Payer: Medicare HMO | Admitting: Physical Therapy

## 2021-08-26 DIAGNOSIS — M6281 Muscle weakness (generalized): Secondary | ICD-10-CM

## 2021-08-26 DIAGNOSIS — M5459 Other low back pain: Secondary | ICD-10-CM

## 2021-08-26 LAB — URINE CULTURE
MICRO NUMBER:: 13631321
SPECIMEN QUALITY:: ADEQUATE

## 2021-08-26 NOTE — Therapy (Addendum)
OUTPATIENT PHYSICAL THERAPY TREATMENT /Re-Cert   Patient Name: William Norman MRN: [id number] DOB:[birth year], 81 y.o., male Today's Date: 08/26/2021   PT End of Session - 08/26/21 1523     Visit Number 8    Number of Visits 12    Date for PT Re-Evaluation 09/21/21    Authorization Type [employer] Medicare    PT Start Time 2458    PT Stop Time 0998    PT Time Calculation (min) 40 min    Activity Tolerance Patient tolerated treatment well                 Past Medical History:  Diagnosis Date   BPH (benign prostatic hypertrophy)    BPH associated with nocturia 08/29/2013   BPH with obstruction/lower urinary tract symptoms 08/14/2019   CAD (coronary artery disease)    CAD (coronary artery disease), native coronary artery    PTCA of RCA 1986, PTCA of LAD 1992,  Negative treadmill Cardiolite 2011    Chronic dermatitis of hands 10/12/2015   Chronic venous insufficiency    Coagulation disorder ([place]) 10/26/2018   DVT (deep venous thrombosis) ([place])    Dyslipidemia    Dysrhythmia    Eczema    Erectile dysfunction 07/11/2012   Essential hypertension    GERD 08/04/2006       GERD (gastroesophageal reflux disease)    Hyperglycemia 07/08/2019   Hyperlipidemia    Hypertension    Incomplete emptying of bladder 08/14/2019   Long term current use of anticoagulant therapy    Lumbar disc disease    Overweight (BMI 25.0-29.9) 06/18/2015   Persistent atrial fibrillation ([place]) 04/10/2018   Personal history of DVT (deep vein thrombosis)    Initially in 1998 with recurrence in 2002 now on chronic warfarin    Soft tissue lesion of foot 08/27/2015   Stroke [facility])    Urinary tract infection with hematuria 08/14/2019   Past Surgical History:  Procedure Laterality Date   APPLICATION OF WOUND VAC  09/02/2020   Procedure: APPLICATION OF WOUND VAC;  Surgeon: Donnie Mesa, MD;  Location: [place];  Service: General;;   CARDIAC CATHETERIZATION     CARPAL TUNNEL RELEASE  04/21/2011    Procedure: CARPAL TUNNEL RELEASE;  Surgeon: Tennis Must, MD;  Location: [place];  Service: Orthopedics;  Laterality: Left;   CARPAL TUNNEL RELEASE  05/23/2011   Procedure: CARPAL TUNNEL RELEASE;  Surgeon: Tennis Must, MD;  Location: [place];  Service: Orthopedics;  Laterality: Right;   COLON SURGERY  2010 and 2011 colostomy reversal   CORONARY ANGIOPLASTY     EYE SURGERY     repair of macular hole   LAPAROTOMY N/A 09/02/2020   Procedure: EXPLORATORY LAPAROTOMY, LYSIS OF ADHESIONS ,BOWEL RESECTION;  Surgeon: Donnie Mesa, MD;  Location: [place];  Service: General;  Laterality: N/A;   LUMBAR LAMINECTOMY/DECOMPRESSION MICRODISCECTOMY Left 03/18/2021   Procedure: LAMINOTOMY, FORAMINOTOMY, LEFT LUMBAR THREE-FOUR;  Surgeon: Consuella Lose, MD;  Location: [place];  Service: Neurosurgery;  Laterality: Left;   NASAL SEPTUM SURGERY     precutaneous transluminal coronary angioplasty     right hernia     TONSILLECTOMY AND ADENOIDECTOMY     Patient Active Problem List   Diagnosis Date Noted   Depression, major, single episode, mild ([name]) 08/24/2021   Osteoarthritis 11/20/2020   Constipation 10/02/2020   Protein-calorie malnutrition, severe 09/03/2020   SBO (small bowel obstruction) ([name]) 08/28/2020   AAA (abdominal aortic aneurysm) ([name]) 03/06/2020   Porokeratosis  02/25/2020   History of ischemic stroke 02/16/2020   Eczema    DVT (deep venous thrombosis) ([facility])    CAD (coronary artery disease)    Recurrent UTI 08/14/2019   Hyperglycemia 07/08/2019   Persistent atrial fibrillation ([name]) 04/10/2018   Chronic dermatitis of hands 10/12/2015   Soft tissue lesion of foot 08/27/2015   Stroke ([name]) 06/18/2015   BPH associated with nocturia 08/29/2013   CAD (coronary artery disease), native coronary artery    Long term current use of anticoagulant therapy    Personal history of DVT (deep vein thrombosis)    Chronic venous insufficiency    Erectile dysfunction  07/11/2012   Lumbar radiculopathy    GERD 08/04/2006   Dyslipidemia    Essential hypertension     PCP: Dimas Chyle  REFERRING PROVIDER: Lynne Leader  REFERRING DIAG: Lumbar Radiculopathy  THERAPY DIAG:  Other low back pain  Muscle weakness (generalized)  ONSET DATE:   SUBJECTIVE:                                                                                                                                                                                           SUBJECTIVE STATEMENT: Pt states he "thinks his leg weakness is getting worse". He admits to not doing HEP.     Eval: Pt reports ongoing pain and difficulty with back and legs. He had surgery in feb 2022, states minimal relief. He continues to have bothersome pain in back, glutes, and LEs. He notes worsening weakness in legs.  He does have SPC, not using all the time. He did have one recent fall where his leg gave out on him.  Taking meds for pain/ tylenol, only getting some relief Pain in L low back,  First think in AM pain is the worst, R>L,   L foot drop, Radicular pain in L leg into toes.  Also has pain in L knee, with weakness. Did have injection recently. States sometimes has no pain, but has sharp pains at times with turning and walking,  and feels like its going to give way.  Full flight of stairs at home to bedroom,    PERTINENT HISTORY:  Stroke, CAD, A-fib, Previous back surgery Feb 2022  PAIN:  Are you having pain? Yes: NPRS scale: 4-5 /10 Pain location: Back, into glutes, hips, legs  Pain description: radiating  Aggravating factors: standing, walking,  Relieving factors: sitting   Are you having pain? Yes: NPRS scale: 8/10 Pain location: L knee  Pain description: Weakness, feels like its doing to give out, has had fall from this.  Aggravating factors: Only if he turns wrong on  it.  Relieving factors: none    PRECAUTIONS: Fall   FALLS:  Has patient fallen in last 6 months? Yes. Number of falls  1 L knee gave out, was able to get himself up, no injury   PLOF: Independent  PATIENT GOALS   Decreased pain, leg strength.    OBJECTIVE:   DIAGNOSTIC FINDINGS: states he had L knee x-ray, do not see results.   COGNITION:  Overall cognitive status: Within functional limits for tasks assessed     PALPATION:   LUMBAR ROM:  Hip ROM: Mild limitation for flex and rotation  Active  AROM  06/28/2021  Flexion Mod/sig limitation  Extension Sig limitation  Right lateral flexion Mod limitation  Left lateral flexion Mod limitation   Right rotation   Left rotation    (Blank rows = not tested)   LE MMT:  Hips: R  4/5,  L hip: 3-/5, L quad: 3-/5   GAIT: Distance walked: 100 Assistive device utilized: Single point cane and None Level of assistance: Modified independence Comments: foot drop on L, causing increased hip flexion with swing , inability for heel strike with initial contact.     TODAY'S TREATMENT   08/26/21: Therapeutic Exercise: Aerobic: Supine:  Quad sets x 15 bil   SAQ- with manual assist x 10 on L; x5 SAQ independent;     Supine march with TA x 20 ;  manually resisted leg press(ext) for L x 15;   Bridging x 10- mild cramping in R leg.  Seated:  sit to stand (higher mat table) x 10 , Standing:L/R and A/P weight shifts x 10, L/R weight shifts with reach x 10 ea;  Stretches:     PATIENT EDUCATION:  Education details: reviewed HEP Person educated: Patient Education method: Consulting civil engineer, Demonstration, Corporate treasurer cues, Verbal cues, and Handouts Education comprehension: verbalized understanding, returned demonstration, verbal cues required, tactile cues required, and needs further education   HOME EXERCISE PROGRAM: Access Code: [email or web address]    ASSESSMENT:  CLINICAL IMPRESSION: 08/26/2021 Recommended that pt call back about AFO, for at least initial consult. Discussed benefits at length today, of doing home exercise program and trying to strengthen muscles. Pt  still has significant weakness in LEs that is effecting his safety and mobility. Difficulty in standing due to L knee feeling like it is going to buckle. Also recommended f/u with Dr Georgina Snell in near future to discuss progress. Plan to progress as tolerated.    Eval: Pt presents with primary complaint of ongoing pain and deficits in back. He has had minimal relief from surgery and injections, and continues to have pain into bil glutes and LEs. He has severe weakness noted in L quad as well as L DF, which is causing foot drop. He has poor gait pattern due to foot drop, and may benefit from evaluation for AFO. He has decreased ability and tolerance for standing and walking activity, due to pain and weakness. He has had 1 recent fall from leg giving out on him. Pt to benefit from skilled PT to improve deficits and pain.    OBJECTIVE IMPAIRMENTS Abnormal gait, decreased activity tolerance, decreased balance, decreased coordination, decreased endurance, decreased knowledge of use of DME, decreased mobility, difficulty walking, decreased ROM, decreased strength, decreased safety awareness, improper body mechanics, and pain.   ACTIVITY LIMITATIONS cleaning, community activity, driving, yard work, and shopping.   PERSONAL FACTORS 1-2 comorbidities: stroke, A-fib,  back surgery,   are also affecting patient's functional outcome.    REHAB POTENTIAL: Fair  CLINICAL DECISION MAKING: Evolving/moderate complexity  EVALUATION COMPLEXITY: Moderate   GOALS: Goals reviewed with patient? Yes  SHORT TERM GOALS: Target date: 07/12/21   Pt to be independent with initial HEP  Goal status: MET     LONG TERM GOALS: Target date: 09/21/2021    Pt to be independent with final HEP  Goal status: IN PROGRESS  2.  Pt to demo improved strength of L knee and hip to at least 4/5 for extension   Goal status: IN PROGRESS  3.  Pt to report decreased pain in back and LEs to 3/10 with activity.   Goal status: IN  PROGRESS   4.  Pt to demo ability for safe ambulation up to .25 mi, to improve ability for community activity and shopping.   Goal status: IN PROGRESS    PLAN: PT FREQUENCY: 2x/week  PT DURATION: 6 weeks  PLANNED INTERVENTIONS: Therapeutic exercises, Therapeutic activity, Neuromuscular re-education, Balance training, Gait training, Patient/Family education, Joint manipulation, Joint mobilization, Stair training, DME instructions, Dry Needling, Electrical stimulation, Spinal manipulation, Spinal mobilization, Cryotherapy, Moist heat, Taping, Traction, Ionotophoresis 49m/ml Dexamethasone, and Manual therapy.  PLAN FOR NEXT SESSION:    LLyndee Hensen PT, DPT 4:48 PM  08/26/21    PHYSICAL THERAPY DISCHARGE SUMMARY  Visits from Start of Care: 8   Plan: Patient agrees to discharge.  Patient goals were partially met. Patient is being discharged due to eeting the stated rehab goals.   LLyndee Hensen PT, DPT 9:14 AM  11/30/21

## 2021-08-27 NOTE — Progress Notes (Signed)
Please inform patient of the following:  His urine culture shows UTI.  Please send in Macrobid 100 mg twice daily x7 days.  He should let us know if his symptoms or not improving.  The rest of his labs are all stable.  We can recheck these in 6 to 12 months.

## 2021-08-30 ENCOUNTER — Other Ambulatory Visit: Payer: Self-pay | Admitting: *Deleted

## 2021-08-30 MED ORDER — NITROFURANTOIN MONOHYD MACRO 100 MG PO CAPS: 100.0000 mg | ORAL_CAPSULE | Freq: Two times a day (BID) | ORAL | 0 refills | Status: AC

## 2021-08-31 ENCOUNTER — Telehealth: Payer: Self-pay | Admitting: Pharmacist

## 2021-08-31 NOTE — Progress Notes (Signed)
Chronic Care Management Pharmacy Assistant   Name: William Norman  MRN: [id number] DOB: 23-Jul-1940   Reason for Encounter: Hypertension Adherence Call    Recent office visits:  08/24/2021 OV (PCP) Vivi Barrack, MD; Macrobid 100 mg twice daily x7 days.  Recent consult visits:  08/04/2021 OV (Podiatry) Gardiner Barefoot, DPM; no medication changes indicated.  Hospital visits:  None since last adherence call  Medications: Outpatient Encounter Medications as of 08/31/2021  Medication Sig   acetaminophen (ACETAMINOPHEN 8 HOUR) 650 MG CR tablet Take 1 tablet (650 mg total) by mouth every 8 (eight) hours as needed for pain.   AMBULATORY NON FORMULARY MEDICATION Please create and dispense 1 ankle-foot orthosis for the left leg to aid in gait for foot drop. M21.372   apixaban (ELIQUIS) 5 MG TABS tablet Take 1 tablet (5 mg total) by mouth 2 (two) times daily.   atenolol (TENORMIN) 25 MG tablet Take 1 tablet (25 mg total) by mouth daily.   atorvastatin (LIPITOR) 40 MG tablet TAKE 1/2 TABLET BY MOUTH EVERY DAY   Coenzyme Q10 (CO Q 10 PO) Take by mouth.   Cyanocobalamin (VITAMIN B-12) 1000 MCG SUBL Place 1 tablet (1,000 mcg total) under the tongue daily.   furosemide (LASIX) 20 MG tablet Take 1 tablet (20 mg total) by mouth daily.   hydroxypropyl methylcellulose / hypromellose (ISOPTO TEARS / GONIOVISC) 2.5 % ophthalmic solution Place 1 drop into both eyes 2 (two) times daily as needed for dry eyes.   nitrofurantoin, macrocrystal-monohydrate, (MACROBID) 100 MG capsule Take 1 capsule (100 mg total) by mouth 2 (two) times daily for 7 days.   nitroGLYCERIN (NITROSTAT) 0.4 MG SL tablet Dissolve 1 tablet under the tongue every 5 minutes as needed   oxymetazoline (AFRIN) 0.05 % nasal spray Place 2 sprays into both nostrils at bedtime as needed for congestion.   No facility-administered encounter medications on file as of 08/31/2021.   Reviewed chart prior to disease state call. Spoke with  patient regarding BP  Recent Office Vitals: BP Readings from Last 3 Encounters:  08/24/21 130/70  07/23/21 (!) 164/94  07/13/21 (!) 156/82   Pulse Readings from Last 3 Encounters:  08/24/21 72  07/23/21 72  07/13/21 71    Wt Readings from Last 3 Encounters:  08/24/21 209 lb 3.2 oz (94.9 kg)  07/13/21 211 lb (95.7 kg)  07/06/21 213 lb 6.4 oz (96.8 kg)     Kidney Function Lab Results  Component Value Date/Time   CREATININE 0.91 08/24/2021 02:43 PM   CREATININE 0.80 03/15/2021 02:18 PM   CREATININE 0.87 10/02/2020 04:26 PM   CREATININE 0.84 09/30/2019 01:49 PM   GFR 78.96 08/24/2021 02:43 PM   GFRNONAA >60 03/15/2021 02:18 PM   GFRAA 97 12/03/2019 03:09 PM       Latest Ref Rng & Units 08/24/2021    2:43 PM 03/15/2021    2:18 PM 11/17/2020    2:37 PM  BMP  Glucose 70 - 99 mg/dL 89  99  102   BUN 6 - 23 mg/dL '20  17  16   '$ Creatinine 0.40 - 1.50 mg/dL 0.91  0.80  0.80   BUN/Creat Ratio 10 - 24   20   Sodium 135 - 145 mEq/L 139  142  141   Potassium 3.5 - 5.1 mEq/L 5.3 No hemolysis seen  4.6  4.6   Chloride 96 - 112 mEq/L 104  103  105   CO2 19 - 32 mEq/L 30  29  21   Calcium 8.4 - 10.5 mg/dL 10.1  9.6  9.7     Current antihypertensive regimen:  Atenolol 25 mg daily Furosemide 20 mg daily  How often are you checking your Blood Pressure? infrequently  Current home BP readings: Patient is not currently monitoring his blood pressure at home. He states it's always normal when he goes into the office.  What recent interventions/DTPs have been made by any provider to improve Blood Pressure control since last CPP Visit: No recent interventions or DTPs.  Any recent hospitalizations or ED visits since last visit with CPP? No  What diet changes have been made to improve Blood Pressure Control?  No recent diet changes have been made.  What exercise is being done to improve your Blood Pressure Control?  Patient states he is not able to exercise due to pain.  Adherence  Review: Is the patient currently on ACE/ARB medication? No Does the patient have >5 day gap between last estimated fill dates? No   Care Gaps: Medicare Annual Wellness: Completed 08/05/2021 Hemoglobin A1C: 5.9% on 07/20/2020 Colonoscopy: Aged out  Future Appointments  Date Time Provider [place]  10/27/2021  1:20 PM Richardo Priest, MD [facility] None  11/02/2021  3:00 PM Gardiner Barefoot, DPM [facility]  01/04/2022  1:30 PM [facility] CCM PHARMACIST [facility]  02/24/2022  2:00 PM Vivi Barrack, MD [facility]  08/15/2022  1:00 PM [facility] HEALTH COACH [facility]   Star Rating Drugs: Atorvastatin 40 mg last filled 08/05/2021  April D Calhoun, [place] Pharmacist Assistant [phone]

## 2021-09-09 ENCOUNTER — Ambulatory Visit: Payer: Medicare HMO | Admitting: Cardiology

## 2021-09-11 ENCOUNTER — Other Ambulatory Visit: Payer: Self-pay | Admitting: Family Medicine

## 2021-10-04 ENCOUNTER — Ambulatory Visit (INDEPENDENT_AMBULATORY_CARE_PROVIDER_SITE_OTHER): Payer: Medicare HMO | Admitting: Family Medicine

## 2021-10-04 VITALS — BP 137/81 | HR 72 | Temp 98.0°F | Ht 72.0 in | Wt 217.6 lb

## 2021-10-04 DIAGNOSIS — I10 Essential (primary) hypertension: Secondary | ICD-10-CM

## 2021-10-04 DIAGNOSIS — N390 Urinary tract infection, site not specified: Secondary | ICD-10-CM | POA: Diagnosis not present

## 2021-10-04 DIAGNOSIS — N401 Enlarged prostate with lower urinary tract symptoms: Secondary | ICD-10-CM

## 2021-10-04 DIAGNOSIS — I25118 Atherosclerotic heart disease of native coronary artery with other forms of angina pectoris: Secondary | ICD-10-CM

## 2021-10-04 DIAGNOSIS — R351 Nocturia: Secondary | ICD-10-CM | POA: Diagnosis not present

## 2021-10-04 LAB — POCT URINALYSIS DIPSTICK
Bilirubin, UA: NEGATIVE
Blood, UA: POSITIVE
Glucose, UA: NEGATIVE
Ketones, UA: NEGATIVE
Nitrite, UA: POSITIVE
Protein, UA: POSITIVE — AB
Spec Grav, UA: 1.015 (ref 1.010–1.025)
Urobilinogen, UA: 0.2 E.U./dL
pH, UA: 6 (ref 5.0–8.0)

## 2021-10-04 MED ORDER — AMOXICILLIN 875 MG PO TABS: 875.0000 mg | ORAL_TABLET | Freq: Two times a day (BID) | ORAL | 0 refills | Status: AC

## 2021-10-04 MED ORDER — TAMSULOSIN HCL 0.4 MG PO CAPS: 0.4000 mg | ORAL_CAPSULE | Freq: Every day | ORAL | 3 refills | Status: DC

## 2021-10-04 NOTE — Assessment & Plan Note (Signed)
UA today consistent with recurrent UTI.  Urine cultures pending.  No signs of systemic illness.  He did not respond to [place].  We will start amoxicillin.  His previous urine culture showed Klebsiella that was susceptible to this.  We will place referral to urology for further management.

## 2021-10-04 NOTE — Assessment & Plan Note (Signed)
Likely contributing to his recurrent UTI.  We will place referral to urology.  Also start Flomax.  He has done well with this in the past.

## 2021-10-04 NOTE — Patient Instructions (Signed)
It was very nice to see you today!  We will check a urine culture today.  Please start the amoxicillin.  Please also start the Flomax to help with your prostate.  I will refer you to see the urologist and cardiologist.  Take care, Dr [name]  PLEASE NOTE:  If you had any lab tests please let us know if you have not heard back within a few days. You may see your results on mychart before we have a chance to review them but we will give you a call once they are reviewed by [place]. If we ordered any referrals today, please let us know if you have not heard from their office within the next week.   Please try these tips to maintain a healthy lifestyle:  Eat at least 3 REAL meals and 1-2 snacks per day.  Aim for no more than 5 hours between eating.  If you eat breakfast, please do so within one hour of getting up.   Each meal should contain half fruits/vegetables, one quarter protein, and one quarter carbs (no bigger than a computer mouse)  Cut down on sweet beverages. This includes juice, soda, and sweet tea.   Drink at least 1 glass of water with each meal and aim for at least 8 glasses per day  Exercise at least 150 minutes every week.

## 2021-10-04 NOTE — Assessment & Plan Note (Signed)
Follows with cardiology however his cardiologist recently moved to a new location.  He needs a referral to see a new cardiologist.  We will place referral today.  He is on Lipitor 40 mg daily.

## 2021-10-04 NOTE — Progress Notes (Signed)
   William Norman is a 81 y.o. male who presents today for an office visit.  Assessment/Plan:  Chronic Problems Addressed Today: Recurrent UTI UA today consistent with recurrent UTI.  Urine cultures pending.  No signs of systemic illness.  He did not respond to [place].  We will start amoxicillin.  His previous urine culture showed Klebsiella that was susceptible to this.  We will place referral to urology for further management.  BPH associated with nocturia Likely contributing to his recurrent UTI.  We will place referral to urology.  Also start Flomax.  He has done well with this in the past.  CAD (coronary artery disease), native coronary artery Follows with cardiology however his cardiologist recently moved to a new location.  He needs a referral to see a new cardiologist.  We will place referral today.  He is on Lipitor 40 mg daily.     Subjective:  HPI:  Patient here with concern for recurrent UTI.  We last saw him about 5-6 weeks ago. We started him on macrobid. Symptoms did not change. He is still having dysuria. No fevers or chills.   See A/P for status of chronic conditions.       Objective:  Physical Exam: BP 137/81   Pulse 72   Temp 98 F (36.7 C) (Temporal)   Ht 6' (1.829 m)   Wt 217 lb 9.6 oz (98.7 kg)   SpO2 97%   BMI 29.51 kg/m   Gen: No acute distress, resting comfortably CV: Regular rate and rhythm with no murmurs appreciated Pulm: Normal work of breathing, clear to auscultation bilaterally with no crackles, wheezes, or rhonchi Neuro: Grossly normal, moves all extremities Psych: Normal affect and thought content      William Norman M. [name], MD 10/04/2021 1:45 PM

## 2021-10-05 ENCOUNTER — Telehealth: Payer: Self-pay | Admitting: Cardiology

## 2021-10-05 NOTE — Telephone Encounter (Signed)
Pt would like to do a provider Switch Due to location  From: [facility] To: Dr. Harrell Gave

## 2021-10-06 LAB — URINE CULTURE
MICRO NUMBER:: 13807755
SPECIMEN QUALITY:: ADEQUATE

## 2021-10-08 NOTE — Progress Notes (Signed)
Please inform patient of the following:  His urine culture is inconclusive.  He should finish his antibiotics and let us know if symptoms are not improving.

## 2021-10-12 ENCOUNTER — Telehealth: Payer: Self-pay | Admitting: Pharmacist

## 2021-10-12 NOTE — Progress Notes (Unsigned)
Chronic Care Management Pharmacy Assistant   Name: William Norman  MRN: [id number] DOB: 1940-08-14   Reason for Encounter: Hypertension Adherence Call    Recent office visits:  10/04/2021 OV (PCP) Vivi Barrack, MD; He did not respond to [facility].  We will start amoxicillin, Also start Flomax  Recent consult visits:  None  Hospital visits:  None in previous 6 months  Medications: Outpatient Encounter Medications as of 10/12/2021  Medication Sig   acetaminophen (ACETAMINOPHEN 8 HOUR) 650 MG CR tablet Take 1 tablet (650 mg total) by mouth every 8 (eight) hours as needed for pain.   AMBULATORY NON FORMULARY MEDICATION Please create and dispense 1 ankle-foot orthosis for the left leg to aid in gait for foot drop. M21.372   amoxicillin (AMOXIL) 875 MG tablet Take 1 tablet (875 mg total) by mouth 2 (two) times daily for 10 days.   apixaban (ELIQUIS) 5 MG TABS tablet Take 1 tablet (5 mg total) by mouth 2 (two) times daily.   atenolol (TENORMIN) 25 MG tablet TAKE 1 TABLET (25 MG TOTAL) BY MOUTH DAILY.   atorvastatin (LIPITOR) 40 MG tablet TAKE 1/2 TABLET BY MOUTH EVERY DAY   Coenzyme Q10 (CO Q 10 PO) Take by mouth.   Cyanocobalamin (VITAMIN B-12) 1000 MCG SUBL Place 1 tablet (1,000 mcg total) under the tongue daily.   furosemide (LASIX) 20 MG tablet Take 1 tablet (20 mg total) by mouth daily.   hydroxypropyl methylcellulose / hypromellose (ISOPTO TEARS / GONIOVISC) 2.5 % ophthalmic solution Place 1 drop into both eyes 2 (two) times daily as needed for dry eyes.   nitroGLYCERIN (NITROSTAT) 0.4 MG SL tablet Dissolve 1 tablet under the tongue every 5 minutes as needed   oxymetazoline (AFRIN) 0.05 % nasal spray Place 2 sprays into both nostrils at bedtime as needed for congestion.   tamsulosin (FLOMAX) 0.4 MG CAPS capsule Take 1 capsule (0.4 mg total) by mouth daily.   No facility-administered encounter medications on file as of 10/12/2021.   Reviewed chart prior to disease state  call. Spoke with patient regarding BP  Recent Office Vitals: BP Readings from Last 3 Encounters:  10/04/21 137/81  08/24/21 130/70  07/23/21 (!) 164/94   Pulse Readings from Last 3 Encounters:  10/04/21 72  08/24/21 72  07/23/21 72    Wt Readings from Last 3 Encounters:  10/04/21 217 lb 9.6 oz (98.7 kg)  08/24/21 209 lb 3.2 oz (94.9 kg)  07/13/21 211 lb (95.7 kg)     Kidney Function Lab Results  Component Value Date/Time   CREATININE 0.91 08/24/2021 02:43 PM   CREATININE 0.80 03/15/2021 02:18 PM   CREATININE 0.87 10/02/2020 04:26 PM   CREATININE 0.84 09/30/2019 01:49 PM   GFR 78.96 08/24/2021 02:43 PM   GFRNONAA >60 03/15/2021 02:18 PM   GFRAA 97 12/03/2019 03:09 PM       Latest Ref Rng & Units 08/24/2021    2:43 PM 03/15/2021    2:18 PM 11/17/2020    2:37 PM  BMP  Glucose 70 - 99 mg/dL 89  99  102   BUN 6 - 23 mg/dL '20  17  16   '$ Creatinine 0.40 - 1.50 mg/dL 0.91  0.80  0.80   BUN/Creat Ratio 10 - 24   20   Sodium 135 - 145 mEq/L 139  142  141   Potassium 3.5 - 5.1 mEq/L 5.3 No hemolysis seen  4.6  4.6   Chloride 96 - 112 mEq/L 104  103  105   CO2 19 - 32 mEq/L '30  29  21   '$ Calcium 8.4 - 10.5 mg/dL 10.1  9.6  9.7     Current antihypertensive regimen:  Atenolol 25 mg daily Furosemide 20 mg daily  How often are you checking your Blood Pressure? infrequently  Current home BP readings: none to report. Patient states his blood pressure does well in the office so he hasn't been checking at home as often.  What recent interventions/DTPs have been made by any provider to improve Blood Pressure control since last CPP Visit: No recent interventions or DTPs.  Any recent hospitalizations or ED visits since last visit with CPP? No  What diet changes have been made to improve Blood Pressure Control?  No recent diet changes have been made.  What exercise is being done to improve your Blood Pressure Control?  Patient states he has a lot of pain in hips and back that  prevent him from exercising. He has trouble walking, he uses cane/cart to walk.  Adherence Review: Is the patient currently on ACE/ARB medication? No Does the patient have >5 day gap between last estimated fill dates? No   Care Gaps: Medicare Annual Wellness: Completed 08/05/2021 Hemoglobin A1C: 5.9% on 07/20/2020 Colonoscopy: Aged out  Future Appointments  Date Time Provider [place]  11/02/2021  3:00 PM Gardiner Barefoot, DPM [facility]  01/04/2022  1:30 PM [facility] CCM PHARMACIST [facility]  02/24/2022  2:00 PM Vivi Barrack, MD [facility]  08/15/2022  1:00 PM [facility] HEALTH COACH [facility]   Star Rating Drugs: Atorvastatin 40 mg last filled 08/05/2021 90 DS  April D Calhoun, [place] Pharmacist Assistant [phone]

## 2021-10-13 NOTE — Telephone Encounter (Signed)
OK by me, thanks

## 2021-10-21 ENCOUNTER — Encounter (HOSPITAL_BASED_OUTPATIENT_CLINIC_OR_DEPARTMENT_OTHER): Payer: Self-pay

## 2021-10-27 ENCOUNTER — Ambulatory Visit: Payer: Medicare HMO | Admitting: Cardiology

## 2021-10-28 ENCOUNTER — Other Ambulatory Visit: Payer: Self-pay | Admitting: Cardiology

## 2021-10-29 ENCOUNTER — Telehealth: Payer: Self-pay | Admitting: Family Medicine

## 2021-10-29 NOTE — Telephone Encounter (Signed)
Pt is upset regarding a recent issue with billing. He scheduled a bank payment to be paid to bring his account up to date. Billing received the check, but issued a refund check & sent to pt. His bill still exists and he wants to know why the payment was sent back & not applied to bill. [facility] consists of copays due for physical therapy on 07/28/21, 08/03/21, 08/10/21, and 08/26/21. Pt would like an explanation before he tries to send another payment.

## 2021-11-02 ENCOUNTER — Encounter: Payer: Self-pay | Admitting: Podiatry

## 2021-11-02 ENCOUNTER — Ambulatory Visit: Payer: Medicare HMO | Admitting: Podiatry

## 2021-11-02 DIAGNOSIS — M79676 Pain in unspecified toe(s): Secondary | ICD-10-CM

## 2021-11-02 DIAGNOSIS — B351 Tinea unguium: Secondary | ICD-10-CM | POA: Diagnosis not present

## 2021-11-02 DIAGNOSIS — D689 Coagulation defect, unspecified: Secondary | ICD-10-CM

## 2021-11-02 DIAGNOSIS — I872 Venous insufficiency (chronic) (peripheral): Secondary | ICD-10-CM

## 2021-11-02 NOTE — Progress Notes (Signed)
This patient returns to my office for at risk foot care.  This patient requires this care by a professional since this patient will be at risk due to having venous insufficiency and coagulation defect.  Patient is taking coumadin.  This patient is unable to cut nails himself since the patient cannot reach his nails.These nails are painful walking and wearing shoes.This patient presents for at risk foot care today.  General Appearance  Alert, conversant and in no acute stress.  Vascular  Dorsalis pedis and posterior tibial  pulses are palpable  bilaterally.  Capillary return is within normal limits  bilaterally. Temperature is within normal limits  bilaterally.  Neurologic  Senn-Weinstein monofilament wire test within normal limits  bilaterally. Muscle power within normal limits bilaterally.  Nails Thick disfigured discolored nails with subungual debris  from hallux to fifth toes bilaterally. No evidence of bacterial infection or drainage bilaterally.   Orthopedic  No limitations of motion  feet .  No crepitus or effusions noted.  No bony pathology or digital deformities noted.  Skin  normotropic skin  noted bilaterally.  No signs of infections or ulcers noted.   Asymptomatic porokeratosis sub 5th met left foot.  Onychomycosis  Pain in right toes  Pain in left toes    Consent was obtained for treatment procedures.   Mechanical debridement of nails 1-5  bilaterally performed with a nail nipper.  Filed with dremel without incident. Patient says he had an ingrown toenail lateral left which he treated at home.  No pain noted.   Return office visit   10 weeks                  Told patient to return for periodic foot care and evaluation due to potential at risk complications.   Gardiner Barefoot DPM

## 2021-12-06 ENCOUNTER — Ambulatory Visit: Payer: Medicare HMO | Admitting: Family Medicine

## 2021-12-06 VITALS — BP 140/76 | HR 67 | Ht 72.0 in | Wt 219.0 lb

## 2021-12-06 DIAGNOSIS — M545 Low back pain, unspecified: Secondary | ICD-10-CM | POA: Diagnosis not present

## 2021-12-06 DIAGNOSIS — M5416 Radiculopathy, lumbar region: Secondary | ICD-10-CM | POA: Diagnosis not present

## 2021-12-06 NOTE — Progress Notes (Signed)
I, Peterson Lombard, LAT, ATC acting as a scribe for Lynne Leader, MD.  William Norman is a 81 y.o. male who presents to [place] at [facility] today for cont'd lumbar radiculopathy w/ L leg weakness. Pt had a L laminotomy, foraminotomy at L3-4 on 03/18/21 by Dr. Kathyrn Sheriff. Pt was last seen by Dr. Georgina Snell on 07/13/21 and was advised to temporarily stop his Eliquis prior to the [facility], which was performed on 07/23/21. Today, pt reports worsening balance and L drop foot. Pt c/o cont'd LBP across both sides. Pt notes numbness/tingling all over L leg to foot. Pt never got the AFO for his L foot.  He states that he is very careful with how he walks and uses a cane and that he does not think an AFO is worth it.  He notes that he has not had any falls or near misses with falls.  Dx imaging: 07/10/21 L-spine MRI             L knee XR- 06/07/21 at [facility] 03/18/21 L-spine XR 12/20/20 L-spine MRI             02/26/20 L-spine XR             02/15/07 L-spine XR  Pertinent review of systems: No fevers or chills  Relevant historical information: History of DVT.  Atrial fibrillation.  On anticoagulation Eliquis   Exam:  BP (!) 140/76   Pulse 67   Ht 6' (1.829 m)   Wt 219 lb (99.3 kg)   SpO2 93%   BMI 29.70 kg/m  General: Well Developed, well nourished, and in no acute distress.   MSK: L-spine: Normal appearing Nontender midline. Normal lumbar motion. Lower extremity strength significant weakness to left foot dorsiflexion 2/5. Foot drop gait using a cane to ambulate.    Lab and Radiology Results  EXAM: MRI LUMBAR SPINE WITHOUT CONTRAST   TECHNIQUE: Multiplanar, multisequence MR imaging of the lumbar spine was performed. No intravenous contrast was administered.   COMPARISON:  MRI lumbar spine 12/20/2020   FINDINGS: Segmentation:  5 lumbar vertebra   Alignment: Mild retrolisthesis L1-2, L2-3, L3-4. Mild anterolisthesis L4-5 asymmetric to the right unchanged  from the prior study   Vertebrae:  Negative for fracture or mass   Conus medullaris and cauda equina: Conus extends to the T12-L1 level. Conus and cauda equina appear normal.   Paraspinal and other soft tissues: Negative for paraspinous mass or adenopathy. Bladder trabeculation.   Disc levels:   L1-2: Mild disc and facet degeneration. Asymmetric spurring lateral to the foramina left unchanged. Mild subarticular stenosis on the left   L2-3: Disc degeneration with diffuse disc bulging. Asymmetric disc protrusion on the left similar to the prior study. Moderate facet hypertrophy. Moderate central canal stenosis. Moderate to advanced subarticular foraminal stenosis on the left unchanged. Expected left L2 and L3 nerve root impingement.   L3-4: Left laminectomy is been performed in the interval. Disc degeneration with diffuse disc bulging. Moderate to advanced facet hypertrophy bilaterally. Spinal canal adequately decompressed. Moderate to advanced subarticular and foraminal stenosis bilaterally   L4-5: Severe facet degeneration asymmetric on the right. Severe subarticular and foraminal stenosis on the right due to spurring without interval change. Mild to moderate central canal stenosis unchanged. Mild left subarticular stenosis   L5-S1: Bilateral facet degeneration. Negative for disc protrusion. 4 mm synovial cyst on the left unchanged. No definite neural impingement.   IMPRESSION: 1. Moderate central canal stenosis L2-3 unchanged. Asymmetric disc  protrusion on the left unchanged. Moderate to advanced subarticular and foraminal stenosis on the left unchanged. 2. Postop left laminectomy L3-4. Spinal canal adequately decompressed. Moderate to advanced subarticular and foraminal stenosis bilaterally similar to the prior study. 3. Severe facet degeneration on the right with asymmetric anterolisthesis of L4 on L5. Severe subarticular and foraminal stenosis on the right  unchanged. 4. 4 mm synovial cyst on the left unchanged without neural impingement.     Electronically Signed   By: Franchot Gallo M.D.   On: 07/13/2021 11:37   I, Lynne Leader, personally (independently) visualized and performed the interpretation of the images attached in this note.      Assessment and Plan: 81 y.o. male with low back pain with left foot drop.  This is an acute exacerbation of a chronic problem.  His chief complaint today is chronic low back pain worsening recently.  He notes that the epidural steroid injection in June worked well to control his back pain until recently.  We will go ahead and repeat an epidural steroid injection now.  If this is insufficient to control his back pain he has significant facet DJD at L4-5 which we could certainly target with injection. Recheck in 1 month.  PDMP not reviewed this encounter. Orders Placed This Encounter  Procedures   DG INJECT DIAG/THERA/INC NEEDLE/CATH/PLC EPI/LUMB/SAC W/IMG    Level and technique per radiology L-side L4-5 radicular symptoms    Standing Status:   Future    Standing Expiration Date:   01/06/2022    Order Specific Question:   Reason for Exam (SYMPTOM  OR DIAGNOSIS REQUIRED)    Answer:   Low back pain    Order Specific Question:   Preferred Imaging Location?    Answer:   [id number]   No orders of the defined types were placed in this encounter.    Discussed warning signs or symptoms. Please see discharge instructions. Patient expresses understanding.   The above documentation has been reviewed and is accurate and complete Lynne Leader, M.D.

## 2021-12-06 NOTE — Patient Instructions (Addendum)
Thank you for coming in today.   Please call [facility] at [phone] to schedule your spine injection.    Check back in 1 month

## 2021-12-21 NOTE — Progress Notes (Incomplete)
Chronic Care Management Pharmacy Note  12/21/2021 Name:  William Norman MRN:  [id number] DOB:  November 16, 1940  Summary: PharmD FU doing well besides knee pain/back pain.  Recommendations/Changes made from today's visit: Monitor BP - consider re initiation of HCTZ if BP remains elevated FU with MRI results  Plan: FU w me in 6 months or sooner if needed   Subjective: William Norman is an 81 y.o. year old male who is a primary patient of Vivi Barrack, MD.  The CCM team was consulted for assistance with disease management and care coordination needs.    Engaged with patient face to face for initial visit in response to provider referral for pharmacy case management and/or care coordination services.   Consent to Services:  The patient was given the following information about Chronic Care Management services today, agreed to services, and gave verbal consent: 1. CCM service includes personalized support from designated clinical staff supervised by the primary care provider, including individualized plan of care and coordination with other care providers 2. 24/7 contact phone numbers for assistance for urgent and routine care needs. 3. Service will only be billed when office clinical staff spend 20 minutes or more in a month to coordinate care. 4. Only one practitioner may furnish and bill the service in a calendar month. 5.The patient may stop CCM services at any time (effective at the end of the month) by phone call to the office staff. 6. The patient will be responsible for cost sharing (co-pay) of up to 20% of the service fee (after annual deductible is met). Patient agreed to services and consent obtained.  Patient Care Team: Vivi Barrack, MD as PCP - General (Family Medicine) Bettina Gavia Hilton Cork, MD as PCP - Cardiology (Cardiology) Jacolyn Reedy, MD as Consulting Physician (Cardiology) Rutherford Guys, MD as Consulting Physician (Ophthalmology) Bettina Gavia Hilton Cork, MD as  Consulting Physician (Cardiology) Edrick Kins, DPM as Consulting Physician (Podiatry) Lyndee Hensen, PT as Physical Therapist (Physical Therapy) Edythe Clarity, [facility] as Pharmacist (Pharmacist) Consuella Lose, MD as Consulting Physician (Neurosurgery)  Recent office visits: 11/20/20 [name]) - working on lifestyle mods for glucose.  Most recent A1c was 5.9  Recent consult visits: 11/25/20 [facility], Cardiology) - Volume overload improved, stable on current medication regimen.  Identified with NYHA Class I HF.  11/10/20 [facility], Cardiology) - reports SOB and fluid overload.  Continue to follow, BP is currently controlled.  Hospital visits: 10/16/20 (ED, SOB) - concern for low O2, EKG unremarkable in ED, he was released to home based on these assessments.   Objective:  Lab Results  Component Value Date   CREATININE 0.91 08/24/2021   BUN 20 08/24/2021   GFR 78.96 08/24/2021   GFRNONAA >60 03/15/2021   GFRAA 97 12/03/2019   NA 139 08/24/2021   K 5.3 No hemolysis seen (H) 08/24/2021   CALCIUM 10.1 08/24/2021   CO2 30 08/24/2021   GLUCOSE 89 08/24/2021    Lab Results  Component Value Date/Time   HGBA1C 5.9 08/24/2021 02:43 PM   HGBA1C 5.9 07/20/2020 02:50 PM   GFR 78.96 08/24/2021 02:43 PM   GFR 78.53 07/20/2020 02:50 PM    Last diabetic Eye exam: No results found for: "HMDIABEYEEXA"  Last diabetic Foot exam: No results found for: "HMDIABFOOTEX"   Lab Results  Component Value Date   CHOL 123 08/24/2021   HDL 53.20 08/24/2021   LDLCALC 55 08/24/2021   LDLDIRECT 131.5 04/22/2010   TRIG 72.0 08/24/2021  CHOLHDL 2 08/24/2021       Latest Ref Rng & Units 08/24/2021    2:43 PM 10/02/2020    4:26 PM 09/17/2020   12:45 PM  Hepatic Function  Total Protein 6.0 - 8.3 g/dL 6.9  6.5  6.1   Albumin 3.5 - 5.2 g/dL 4.1   2.0   AST 0 - 37 U/L 28  91  74   ALT 0 - 53 U/L 22  79  108   Alk Phosphatase 39 - 117 U/L 54   123   Total Bilirubin 0.2 - 1.2 mg/dL 1.0  0.7  1.4      Lab Results  Component Value Date/Time   TSH 2.62 08/24/2021 02:43 PM   TSH 3.18 07/20/2020 02:50 PM       Latest Ref Rng & Units 08/24/2021    2:43 PM 03/15/2021    2:18 PM 10/16/2020    7:21 PM  CBC  WBC 4.0 - 10.5 K/uL 7.1  8.8  8.0   Hemoglobin 13.0 - 17.0 g/dL 14.9  15.1  11.6   Hematocrit 39.0 - 52.0 % 45.2  48.5  37.5   Platelets 150.0 - 400.0 K/uL 186.0  215  380     No results found for: "VD25OH"  Clinical ASCVD: Yes  The ASCVD Risk score (Arnett DK, et al., 2019) failed to calculate for the following reasons:   The 2019 ASCVD risk score is only valid for ages 15 to 32   The patient has a prior MI or stroke diagnosis       10/04/2021    1:15 PM 08/24/2021    2:43 PM 08/06/2021    3:02 PM  Depression screen PHQ 2/9  Decreased Interest 0 1 1  Down, Depressed, Hopeless 0 1 1  PHQ - 2 Score 0 2 2  Altered sleeping 0 1 0  Tired, decreased energy 0 1 1  Change in appetite 0 1 0  Feeling bad or failure about yourself  0 1 0  Trouble concentrating 0 1 0  Moving slowly or fidgety/restless 0 1 1  Suicidal thoughts  0 0  PHQ-9 Score 0 8 4  Difficult doing work/chores Not difficult at all Somewhat difficult       Social History   Tobacco Use  Smoking Status Former  . Packs/day: 1.00  . Years: 60.00  . Total pack years: 60.00  . Types: Cigarettes  . Quit date: 03/25/1998  . Years since quitting: 23.7  Smokeless Tobacco Never   BP Readings from Last 3 Encounters:  12/06/21 (!) 140/76  10/04/21 137/81  08/24/21 130/70   Pulse Readings from Last 3 Encounters:  12/06/21 67  10/04/21 72  08/24/21 72   Wt Readings from Last 3 Encounters:  12/06/21 219 lb (99.3 kg)  10/04/21 217 lb 9.6 oz (98.7 kg)  08/24/21 209 lb 3.2 oz (94.9 kg)   BMI Readings from Last 3 Encounters:  12/06/21 29.70 kg/m  10/04/21 29.51 kg/m  08/24/21 28.37 kg/m    Assessment/Interventions: Review of patient past medical history, allergies, medications, health status, including  review of consultants reports, laboratory and other test data, was performed as part of comprehensive evaluation and provision of chronic care management services.   SDOH:  (Social Determinants of Health) assessments and interventions performed: Yes SDOH Interventions    Flowsheet Row Clinical Support from 08/06/2021 in [place]  SDOH Interventions   Depression Interventions/Treatment  Patient refuses Treatment  [pt stated not at  this time]      Financial Resource Strain: Low Risk  (08/06/2021)   Overall Financial Resource Strain (CARDIA)   . Difficulty of Paying Living Expenses: Not hard at all    [place]: No Food Insecurity (08/06/2021)  Housing: Low Risk  (08/06/2021)  Transportation Needs: No Transportation Needs (08/06/2021)  Depression (PHQ2-9): Low Risk  (10/04/2021)  Recent Concern: Depression (PHQ2-9) - Medium Risk (08/24/2021)  Financial Resource Strain: Low Risk  (08/06/2021)  Physical Activity: Inactive (08/06/2021)  Social Connections: Moderately Isolated (08/06/2021)  Stress: Stress Concern Present (08/06/2021)  Tobacco Use: Medium Risk (11/02/2021)    CCM Care Plan  Allergies  Allergen Reactions  . Nitrous Oxide Other (See Comments)    Pt reports he "passed out and almost died" after inhaling gas through mask at dentist office   . Simvastatin Other (See Comments)    Muscle aches     Medications Reviewed Today     Reviewed by Mare Ferrari (Technician) on 12/06/21 at 1416  Med List Status: <None>   Medication Order Taking? Sig Documenting Provider Last Dose Status Informant  acetaminophen (ACETAMINOPHEN 8 HOUR) 650 MG CR tablet [id number] No Take 1 tablet (650 mg total) by mouth every 8 (eight) hours as needed for pain. Vivi Barrack, MD Taking Active Self  Discontinued 12/06/21 1416   apixaban (ELIQUIS) 5 MG TABS tablet [id number] No Take 1 tablet (5 mg total) by mouth 2 (two) times daily. Consuella Lose,  MD Taking Active   atenolol (TENORMIN) 25 MG tablet [id number] No TAKE 1 TABLET (25 MG TOTAL) BY MOUTH DAILY. Vivi Barrack, MD Taking Active   atorvastatin (LIPITOR) 40 MG tablet [id number] No TAKE 1/2 TABLET BY MOUTH EVERY DAY Vivi Barrack, MD Taking Active   Coenzyme Q10 (CO Q 10 PO) [id number] No Take by mouth. [provider] Taking Active   Cyanocobalamin (VITAMIN B-12) 1000 MCG SUBL [id number] No Place 1 tablet (1,000 mcg total) under the tongue daily. Vivi Barrack, MD Taking Active Self  furosemide (LASIX) 20 MG tablet [id number] No Take 1 tablet (20 mg total) by mouth daily. Richardo Priest, MD Taking Active   hydroxypropyl methylcellulose / hypromellose (ISOPTO TEARS / GONIOVISC) 2.5 % ophthalmic solution [id number] No Place 1 drop into both eyes 2 (two) times daily as needed for dry eyes. [provider] Taking Active Self  nitroGLYCERIN (NITROSTAT) 0.4 MG SL tablet [id number] No Dissolve 1 tablet under the tongue every 5 minutes as needed Richardo Priest, MD Taking Active Self  oxymetazoline (AFRIN) 0.05 % nasal spray [id number] No Place 2 sprays into both nostrils at bedtime as needed for congestion. [provider] Taking Active Self  tamsulosin (FLOMAX) 0.4 MG CAPS capsule [id number] No Take 1 capsule (0.4 mg total) by mouth daily. Vivi Barrack, MD Taking Active             Patient Active Problem List   Diagnosis Date Noted  . Depression, major, single episode, mild ([place]) 08/24/2021  . Osteoarthritis 11/20/2020  . Constipation 10/02/2020  . Protein-calorie malnutrition, severe 09/03/2020  . SBO (small bowel obstruction) ([place]) 08/28/2020  . AAA (abdominal aortic aneurysm) ([place]) 03/06/2020  . Porokeratosis 02/25/2020  . History of ischemic stroke 02/16/2020  . Eczema   . DVT (deep venous thrombosis) ([place])   . CAD (coronary artery disease)   . Recurrent UTI 08/14/2019  . Hyperglycemia 07/08/2019  . Persistent atrial fibrillation ([place])  04/10/2018  . Chronic dermatitis of  hands 10/12/2015  . Soft tissue lesion of foot 08/27/2015  . Stroke ([place]) 06/18/2015  . BPH associated with nocturia 08/29/2013  . CAD (coronary artery disease), native coronary artery   . Long term current use of anticoagulant therapy   . Personal history of DVT (deep vein thrombosis)   . Chronic venous insufficiency   . Erectile dysfunction 07/11/2012  . Lumbar radiculopathy   . GERD 08/04/2006  . Dyslipidemia   . Essential hypertension     Immunization History  Administered Date(s) Administered  . Fluad Quad(high Dose 65+) 10/15/2018, 09/29/2021  . Influenza Split 02/14/2005, 11/20/2007, 11/14/2009, 11/16/2012, 10/10/2013, 10/21/2014, 10/12/2015, 11/23/2017  . Influenza Whole 02/14/2005, 11/20/2007, 11/14/2009  . Influenza, High Dose Seasonal PF 10/21/2014, 10/12/2015, 11/23/2017, 10/15/2018, 11/06/2019  . Influenza,inj,Quad PF,6+ Mos 10/10/2013  . Influenza-Unspecified 11/16/2012, 10/10/2013, 10/21/2014, 10/12/2015, 11/23/2017, 11/16/2020  . PFIZER Comirnaty(Gray Top)Covid-19 Tri-Sucrose Vaccine 05/20/2020  . PFIZER(Purple Top)SARS-COV-2 Vaccination 03/01/2019, 03/21/2019, 11/11/2019, 05/20/2020  . PNEUMOCOCCAL CONJUGATE-20 03/20/2021  . Pension scheme manager 56yr & up 11/23/2020, 08/08/2021  . Pneumococcal Conjugate-13 10/21/2014  . Pneumococcal Polysaccharide-23 02/15/2003, 02/18/2008  . Td 02/15/2004, 10/21/2014  . Tdap 02/15/2004, 10/21/2014  . Zoster Recombinat (Shingrix) 04/15/2019, 06/17/2019  . Zoster, Live 03/09/2010  . Zoster, Unspecified 04/15/2019, 06/17/2019    Conditions to be addressed/monitored:  HTN, CAD, Afib, GERD, HLD, BPH, Hyperglycemia  There are no care plans that you recently modified to display for this patient.      Medication Assistance: None required.  Patient affirms current coverage meets needs.  Compliance/Adherence/Medication fill history: Care Gaps: None at this  time  Star-Rating Drugs: Atorvastatin 470m08/18/22 90ds  Patient's preferred pharmacy is:  [id number] [place]: [phone]: [phone] pill box? No - prefers bottles Pt endorses 100% compliance  We discussed: Benefits of medication synchronization, packaging and delivery as well as enhanced pharmacist oversight with [employer]. Patient decided to: Continue current medication management strategy  Care Plan and Follow Up Patient Decision:  Patient agrees to Care Plan and Follow-up.  Plan: The care management team will reach out to the patient again over the next 180 days.  ChrBeverly MilchharmD Clinical Pharmacist  [phone] New Patient Office Visit  Subjective    Patient ID: William Norman    DOB: 11/22-Nov-1942ge: 80 105o. MRN: [id number]: No chief complaint on file.   HPI RalAutrey Humanesents to establish care ***  Outpatient Encounter Medications as of 01/04/2022  Medication Sig  . acetaminophen (ACETAMINOPHEN 8 HOUR) 650 MG CR tablet Take 1 tablet (650 mg total) by mouth every 8 (eight) hours as needed for pain.  . aMarland Kitchenixaban (ELIQUIS) 5 MG TABS tablet Take 1 tablet (5 mg total) by mouth 2 (two) times daily.  . aMarland Kitchenenolol (TENORMIN) 25 MG tablet TAKE 1 TABLET (25 MG TOTAL) BY MOUTH DAILY.  . aMarland Kitchenorvastatin (LIPITOR) 40 MG tablet TAKE 1/2 TABLET BY MOUTH EVERY DAY  . Coenzyme Q10 (CO Q 10 PO) Take by mouth.  . Cyanocobalamin (VITAMIN B-12) 1000 MCG SUBL Place 1 tablet (1,000 mcg total) under the tongue daily.  . furosemide (LASIX) 20 MG tablet Take 1 tablet (20 mg total) by mouth daily.  . hydroxypropyl methylcellulose / hypromellose (ISOPTO TEARS / GONIOVISC) 2.5 % ophthalmic solution Place 1 drop into both eyes 2 (two) times daily as needed for dry eyes.  .Marland Kitchen  nitroGLYCERIN (NITROSTAT) 0.4 MG SL tablet Dissolve 1  tablet under the tongue every 5 minutes as needed  . oxymetazoline (AFRIN) 0.05 % nasal spray Place 2 sprays into both nostrils at bedtime as needed for congestion.  . tamsulosin (FLOMAX) 0.4 MG CAPS capsule Take 1 capsule (0.4 mg total) by mouth daily.   No facility-administered encounter medications on file as of 01/04/2022.    Past Medical History:  Diagnosis Date  . BPH (benign prostatic hypertrophy)   . BPH associated with nocturia 08/29/2013  . BPH with obstruction/lower urinary tract symptoms 08/14/2019  . CAD (coronary artery disease)   . CAD (coronary artery disease), native coronary artery    PTCA of RCA 1986, PTCA of LAD 1992,  Negative treadmill Cardiolite 2011   . Chronic dermatitis of hands 10/12/2015  . Chronic venous insufficiency   . Coagulation disorder ([place]) 10/26/2018  . DVT (deep venous thrombosis) ([place])   . Dyslipidemia   . Dysrhythmia   . Eczema   . Erectile dysfunction 07/11/2012  . Essential hypertension   . GERD 08/04/2006      . GERD (gastroesophageal reflux disease)   . Hyperglycemia 07/08/2019  . Hyperlipidemia   . Hypertension   . Incomplete emptying of bladder 08/14/2019  . Long term current use of anticoagulant therapy   . Lumbar disc disease   . Overweight (BMI 25.0-29.9) 06/18/2015  . Persistent atrial fibrillation ([place]) 04/10/2018  . Personal history of DVT (deep vein thrombosis)    Initially in 1998 with recurrence in 2002 now on chronic warfarin   . Soft tissue lesion of foot 08/27/2015  . Stroke ([place])   . Urinary tract infection with hematuria 08/14/2019    Past Surgical History:  Procedure Laterality Date  . APPLICATION OF WOUND VAC  09/02/2020   Procedure: APPLICATION OF WOUND VAC;  Surgeon: Donnie Mesa, MD;  Location: [facility];  Service: General;;  . CARDIAC CATHETERIZATION    . CARPAL TUNNEL RELEASE  04/21/2011   Procedure: CARPAL TUNNEL RELEASE;  Surgeon: Tennis Must, MD;  Location: [facility];  Service:  Orthopedics;  Laterality: Left;  . CARPAL TUNNEL RELEASE  05/23/2011   Procedure: CARPAL TUNNEL RELEASE;  Surgeon: Tennis Must, MD;  Location: [facility];  Service: Orthopedics;  Laterality: Right;  . COLON SURGERY  2010 and 2011 colostomy reversal  . CORONARY ANGIOPLASTY    . EYE SURGERY     repair of macular hole  . LAPAROTOMY N/A 09/02/2020   Procedure: EXPLORATORY LAPAROTOMY, LYSIS OF ADHESIONS ,BOWEL RESECTION;  Surgeon: Donnie Mesa, MD;  Location: [facility];  Service: General;  Laterality: N/A;  . LUMBAR LAMINECTOMY/DECOMPRESSION MICRODISCECTOMY Left 03/18/2021   Procedure: LAMINOTOMY, FORAMINOTOMY, LEFT LUMBAR THREE-FOUR;  Surgeon: Consuella Lose, MD;  Location: [facility];  Service: Neurosurgery;  Laterality: Left;  . NASAL SEPTUM SURGERY    . precutaneous transluminal coronary angioplasty    . right hernia    . TONSILLECTOMY AND ADENOIDECTOMY      Family History  Problem Relation Age of Onset  . Heart disease Father   . Heart attack Father   . Atrial fibrillation Father   . Heart disease Mother   . Atrial fibrillation Mother   . Stroke Mother   . AAA (abdominal aortic aneurysm) Brother     Social History   Socioeconomic History  . Marital status: Married    Spouse name: Not on file  . Number of children: 3  . Years of education: 72  .  Highest education level: Not on file  Occupational History  . Occupation: [employer]  Tobacco Use  . Smoking status: Former    Packs/day: 1.00    Years: 60.00    Total pack years: 60.00    Types: Cigarettes    Quit date: 03/25/1998    Years since quitting: 23.7  . Smokeless tobacco: Never  Vaping Use  . Vaping Use: Never used  Substance and Sexual Activity  . Alcohol use: Not Currently    Comment: once a month  . Drug use: No  . Sexual activity: Not on file  Other Topics Concern  . Not on file  Social History Narrative   Fun: Working outside in yard, cutting wood    Denies abuse and feels safe at home.    Right  handed   Lives at home with wife, daughter, granddaughter, and son (periodically)   Social Determinants of Health   Financial Resource Strain: Low Risk  (08/06/2021)   Overall Financial Resource Strain (CARDIA)   . Difficulty of Paying Living Expenses: Not hard at all  Food Insecurity: No Food Insecurity (08/06/2021)   Hunger Vital Sign   . Worried About Charity fundraiser in the Last Year: Never true   . Ran Out of Food in the Last Year: Never true  Transportation Needs: No Transportation Needs (08/06/2021)   PRAPARE - Transportation   . Lack of Transportation (Medical): No   . Lack of Transportation (Non-Medical): No  Physical Activity: Inactive (08/06/2021)   Exercise Vital Sign   . Days of Exercise per Week: 0 days   . Minutes of Exercise per Session: 0 min  Stress: Stress Concern Present (08/06/2021)   [place]   . Feeling of Stress : To some extent  Social Connections: Moderately Isolated (08/06/2021)   Social Connection and Isolation Panel [NHANES]   . Frequency of Communication with Friends and Family: More than three times a week   . Frequency of Social Gatherings with Friends and Family: More than three times a week   . Attends Religious Services: Never   . Active Member of Clubs or Organizations: No   . Attends Archivist Meetings: Never   . Marital Status: Married  Human resources officer Violence: Not At Risk (08/06/2021)   Humiliation, Afraid, Rape, and Kick questionnaire   . Fear of Current or Ex-Partner: No   . Emotionally Abused: No   . Physically Abused: No   . Sexually Abused: No    ROS      Objective    There were no vitals taken for this visit.  Physical Exam  {Labs (Optional):23779}    Assessment & Plan:   Problem List Items Addressed This Visit   None   No follow-ups on file.   Edythe Clarity, [facility]   Current Barriers:  Possible elevated BP  Pharmacist Clinical  Goal(s):  Patient will achieve control of BP as evidenced by monitoring through collaboration with PharmD and provider.   Interventions: 1:1 collaboration with Vivi Barrack, MD regarding development and update of comprehensive plan of care as evidenced by provider attestation and co-signature Inter-disciplinary care team collaboration (see longitudinal plan of care) Comprehensive medication review performed; medication list updated in electronic medical record  Hypertension (BP goal <130/80) -Controlled -Current treatment: Atenolol 76m Appropriate, Effective, Safe, Accessible -Medications previously tried: HCTZ (d/c)  -Current home readings: not checking at home, last few office BP elevated -Current exercise habits: minimal -Denies  hypotensive/hypertensive symptoms -Educated on BP goals and benefits of medications for prevention of heart attack, stroke and kidney damage; Daily salt intake goal < 2300 mg; Importance of home blood pressure monitoring; -Counseled to monitor BP at home as able, document, and provide log at future appointments -Recommend to keep an eye on BP - he reports recently d/c HCTZ and decreased his atenolol dose.  He has been in pain which could be also contributing to increased BP. -Recommended to continue current medication Monitor BP - could consider addition of HCTZ back if BP continues to be elevated.  Update 06/29/21 BP reviewed - he reports has been "good" Is still dealing with back/leg pain which could be contributing to elevations. He has MRI upcoming of his back - is also doing PT. Denies any dizziness or HA - no need for changes at this time. Continue to monitor, will have CMA check in periodically on BP/symptoms.  Hyperlipidemia: (LDL goal < 70) -Controlled -Current treatment: Atorvastatin 46m daily Appropriate, Effective, Safe, Accessible -Medications previously tried: none noted  -Most recent LDL is controlled at 626  -Educated on  Cholesterol goals;  Benefits of statin for ASCVD risk reduction; Importance of limiting foods high in cholesterol; Exercise goal of 150 minutes per week; -Recommended to continue current medication Continue routine screenings.  Update 06/29/21 Continues adherence with statin. Denies adverse effects LDL well controlled, last labs from June 2022. Continue current meds - due for fasting labs at next OV.  Atrial Fibrillation (Goal: prevent stroke and major bleeding) -Controlled -CHADSVASC: 6 -Current treatment: Rate control: Atenolol 2233mdaily Anticoagulation: Eliquis 33m333mID -Medications previously tried: none noted -Home BP and HR readings: not checking at home, elevated in office  -Counseled on increased risk of stroke due to Afib and benefits of anticoagulation for stroke prevention; bleeding risk associated with Eliquis and importance of self-monitoring for signs/symptoms of bleeding; -Does not feel rapid HR - no chest pain, denies abnormal bleeding or bruising -Recommended to continue current medication  Hyperglycemia (Goal: Control A1c) -Controlled -Current treatment  None noted -Medications previously tried: none noted -Counseled on meaning of most recent A1c of 5.9 being in the lower end of the pre-diabetes range.  Just something to monitor for now.  -Counseled on diet and exercise extensively Recommended he try and watch carbs and excess sugars in his diet.  Explained sources of carbohydrates in the diet. Continue current management for now - routine A1c screenings.  BPH (Goal: Minimize symptoms) -Controlled -Current treatment  None noted -Medications previously tried: alfuzosin  -Continues to report nocturia, getting up 2-3 times per evening to use the restroom.  He reports this is manageable currently. -Recommended continue current management for now, contact providers if symptoms worsen.  Patient Goals/Self-Care Activities Patient will:  - focus on medication  adherence by pill counts check blood pressure periodically, document, and provide at future appointments  Follow Up Plan: The care management team will reach out to the patient again over the next 180 days.

## 2021-12-22 ENCOUNTER — Emergency Department (HOSPITAL_COMMUNITY): Payer: Medicare HMO

## 2021-12-22 ENCOUNTER — Emergency Department (HOSPITAL_COMMUNITY)
Admission: EM | Admit: 2021-12-22 | Discharge: 2021-12-23 | Disposition: A | Discharge status: Home / Self Care | Payer: Medicare HMO | Attending: Emergency Medicine | Admitting: Emergency Medicine

## 2021-12-22 ENCOUNTER — Other Ambulatory Visit: Payer: Self-pay

## 2021-12-22 DIAGNOSIS — I10 Essential (primary) hypertension: Secondary | ICD-10-CM | POA: Insufficient documentation

## 2021-12-22 DIAGNOSIS — I672 Cerebral atherosclerosis: Secondary | ICD-10-CM | POA: Diagnosis not present

## 2021-12-22 DIAGNOSIS — Z743 Need for continuous supervision: Secondary | ICD-10-CM | POA: Diagnosis not present

## 2021-12-22 DIAGNOSIS — G459 Transient cerebral ischemic attack, unspecified: Secondary | ICD-10-CM | POA: Insufficient documentation

## 2021-12-22 DIAGNOSIS — M549 Dorsalgia, unspecified: Secondary | ICD-10-CM | POA: Diagnosis not present

## 2021-12-22 DIAGNOSIS — Z79899 Other long term (current) drug therapy: Secondary | ICD-10-CM | POA: Insufficient documentation

## 2021-12-22 DIAGNOSIS — G319 Degenerative disease of nervous system, unspecified: Secondary | ICD-10-CM | POA: Diagnosis not present

## 2021-12-22 DIAGNOSIS — R410 Disorientation, unspecified: Secondary | ICD-10-CM | POA: Diagnosis not present

## 2021-12-22 DIAGNOSIS — R4701 Aphasia: Secondary | ICD-10-CM | POA: Diagnosis not present

## 2021-12-22 DIAGNOSIS — I2510 Atherosclerotic heart disease of native coronary artery without angina pectoris: Secondary | ICD-10-CM | POA: Diagnosis not present

## 2021-12-22 DIAGNOSIS — R479 Unspecified speech disturbances: Secondary | ICD-10-CM

## 2021-12-22 DIAGNOSIS — Z7901 Long term (current) use of anticoagulants: Secondary | ICD-10-CM | POA: Diagnosis not present

## 2021-12-22 DIAGNOSIS — I6523 Occlusion and stenosis of bilateral carotid arteries: Secondary | ICD-10-CM | POA: Diagnosis not present

## 2021-12-22 LAB — COMPREHENSIVE METABOLIC PANEL
ALT: 22 U/L (ref 0–44)
AST: 30 U/L (ref 15–41)
Albumin: 3.6 g/dL (ref 3.5–5.0)
Alkaline Phosphatase: 51 U/L (ref 38–126)
Anion gap: 8 (ref 5–15)
BUN: 16 mg/dL (ref 8–23)
CO2: 26 mmol/L (ref 22–32)
Calcium: 9.5 mg/dL (ref 8.9–10.3)
Chloride: 107 mmol/L (ref 98–111)
Creatinine, Ser: 1.11 mg/dL (ref 0.61–1.24)
GFR, Estimated: >60 mL/min (ref >60)
Glucose, Bld: 87 mg/dL (ref 70–99)
Potassium: 3.9 mmol/L (ref 3.5–5.1)
Sodium: 141 mmol/L (ref 135–145)
Total Bilirubin: 1 mg/dL (ref 0.3–1.2)
Total Protein: 6.1 g/dL — ABNORMAL LOW (ref 6.5–8.1)

## 2021-12-22 LAB — CBC WITH DIFFERENTIAL/PLATELET
Abs Immature Granulocytes: 0.03 10*3/uL (ref 0.00–0.07)
Basophils Absolute: 0.1 10*3/uL (ref 0.0–0.1)
Basophils Relative: 1 %
Eosinophils Absolute: 0.1 10*3/uL (ref 0.0–0.5)
Eosinophils Relative: 1 %
HCT: 44.3 % (ref 39.0–52.0)
Hemoglobin: 14.7 g/dL (ref 13.0–17.0)
Immature Granulocytes: 1 %
Lymphocytes Relative: 33 %
Lymphs Abs: 1.8 10*3/uL (ref 0.7–4.0)
MCH: 30.8 pg (ref 26.0–34.0)
MCHC: 33.2 g/dL (ref 30.0–36.0)
MCV: 92.7 fL (ref 80.0–100.0)
Monocytes Absolute: 0.9 10*3/uL (ref 0.1–1.0)
Monocytes Relative: 16 %
Neutro Abs: 2.7 10*3/uL (ref 1.7–7.7)
Neutrophils Relative %: 48 %
Platelets: 183 10*3/uL (ref 150–400)
RBC: 4.78 MIL/uL (ref 4.22–5.81)
RDW: 13.4 % (ref 11.5–15.5)
WBC: 5.6 10*3/uL (ref 4.0–10.5)
nRBC: 0 % (ref 0.0–0.2)

## 2021-12-22 LAB — CBG MONITORING, ED: Glucose-Capillary: 116 mg/dL — ABNORMAL HIGH (ref 70–99)

## 2021-12-22 MED ORDER — IOHEXOL 350 MG/ML SOLN
75.0000 mL | Freq: Once | INTRAVENOUS | Status: AC | PRN
  Administered 2021-12-22: 75 mL via INTRAVENOUS

## 2021-12-22 NOTE — ED Provider Notes (Signed)
[facility] EMERGENCY DEPARTMENT Provider Note   CSN: [id number] Arrival date & time: 12/22/21  1754     History  Chief Complaint  Patient presents with   Transient Ischemic Attack    William Norman is a 81 y.o. male.  The history is provided by the patient and medical records. No language interpreter was used.  Neurologic Problem This is a recurrent problem. The current episode started 1 to 2 hours ago. The problem has been resolved. Pertinent negatives include no chest pain, no abdominal pain, no headaches and no shortness of breath. Nothing aggravates the symptoms. Nothing relieves the symptoms. He has tried nothing for the symptoms. The treatment provided no relief.       Home Medications Prior to Admission medications   Medication Sig Start Date End Date Taking? Authorizing Provider  acetaminophen (ACETAMINOPHEN 8 HOUR) 650 MG CR tablet Take 1 tablet (650 mg total) by mouth every 8 (eight) hours as needed for pain. 11/20/20   Vivi Barrack, MD  apixaban (ELIQUIS) 5 MG TABS tablet Take 1 tablet (5 mg total) by mouth 2 (two) times daily. 03/20/21   Consuella Lose, MD  atenolol (TENORMIN) 25 MG tablet TAKE 1 TABLET (25 MG TOTAL) BY MOUTH DAILY. 09/13/21   Vivi Barrack, MD  atorvastatin (LIPITOR) 40 MG tablet TAKE 1/2 TABLET BY MOUTH EVERY DAY 08/05/21   Vivi Barrack, MD  Coenzyme Q10 (CO Q 10 PO) Take by mouth.    [provider]  Cyanocobalamin (VITAMIN B-12) 1000 MCG SUBL Place 1 tablet (1,000 mcg total) under the tongue daily. 07/24/20   Vivi Barrack, MD  furosemide (LASIX) 20 MG tablet Take 1 tablet (20 mg total) by mouth daily. 10/28/21   Richardo Priest, MD  hydroxypropyl methylcellulose / hypromellose (ISOPTO TEARS / GONIOVISC) 2.5 % ophthalmic solution Place 1 drop into both eyes 2 (two) times daily as needed for dry eyes.    [provider]  nitroGLYCERIN (NITROSTAT) 0.4 MG SL tablet Dissolve 1 tablet under the tongue  every 5 minutes as needed 03/09/21   Richardo Priest, MD  oxymetazoline (AFRIN) 0.05 % nasal spray Place 2 sprays into both nostrils at bedtime as needed for congestion.    [provider]  tamsulosin (FLOMAX) 0.4 MG CAPS capsule Take 1 capsule (0.4 mg total) by mouth daily. 10/04/21   Vivi Barrack, MD      Allergies    Nitrous oxide and Simvastatin    Review of Systems   Review of Systems  Constitutional:  Negative for chills, diaphoresis, fatigue and fever.  HENT:  Negative for congestion.   Eyes:  Negative for pain and visual disturbance.  Respiratory:  Negative for cough, chest tightness, shortness of breath and wheezing.   Cardiovascular:  Negative for chest pain, palpitations and leg swelling.  Gastrointestinal:  Negative for abdominal pain, constipation, diarrhea, nausea and vomiting.  Genitourinary:  Negative for flank pain.  Musculoskeletal:  Negative for back pain, neck pain and neck stiffness.  Skin:  Negative for rash and wound.  Neurological:  Positive for speech difficulty. Negative for dizziness, facial asymmetry, weakness, light-headedness, numbness and headaches.  Psychiatric/Behavioral:  Negative for agitation and confusion.   All other systems reviewed and are negative.   Physical Exam Updated Vital Signs BP (!) 150/79   Pulse 76   Temp 97.7 F (36.5 C) (Oral)   Resp 18   Ht 6' (1.829 m)   Wt 99.8 kg  SpO2 98%   BMI 29.84 kg/m  Physical Exam Vitals and nursing note reviewed.  Constitutional:      General: He is not in acute distress.    Appearance: He is well-developed. He is not ill-appearing, toxic-appearing or diaphoretic.  HENT:     Head: Normocephalic and atraumatic.     Nose: No congestion or rhinorrhea.     Mouth/Throat:     Mouth: Mucous membranes are moist.  Eyes:     Extraocular Movements: Extraocular movements intact.     Conjunctiva/sclera: Conjunctivae normal.     Pupils: Pupils are equal, round, and reactive to light.   Cardiovascular:     Rate and Rhythm: Normal rate and regular rhythm.     Heart sounds: No murmur heard. Pulmonary:     Effort: Pulmonary effort is normal. No respiratory distress.     Breath sounds: Normal breath sounds. No wheezing, rhonchi or rales.  Chest:     Chest wall: No tenderness.  Abdominal:     Palpations: Abdomen is soft.     Tenderness: There is no abdominal tenderness. There is no guarding or rebound.  Musculoskeletal:        General: No swelling or tenderness.     Cervical back: Neck supple. No tenderness.     Right lower leg: No edema.     Left lower leg: No edema.  Skin:    General: Skin is warm and dry.     Capillary Refill: Capillary refill takes less than 2 seconds.     Findings: No erythema or rash.  Neurological:     General: No focal deficit present.     Mental Status: He is alert.     Cranial Nerves: No cranial nerve deficit.     Sensory: No sensory deficit.     Motor: No weakness.     Coordination: Coordination normal.  Psychiatric:        Mood and Affect: Mood normal.     ED Results / Procedures / Treatments   Labs (all labs ordered are listed, but only abnormal results are displayed) Labs Reviewed  COMPREHENSIVE METABOLIC PANEL - Abnormal; Notable for the following components:      Result Value   Total Protein 6.1 (*)    All other components within normal limits  CBG MONITORING, ED - Abnormal; Notable for the following components:   Glucose-Capillary 116 (*)    All other components within normal limits  CBC WITH DIFFERENTIAL/PLATELET      EKG EKG Interpretation  Date/Time:  Wednesday December 22 2021 18:01:52 EST Ventricular Rate:  73 PR Interval:    QRS Duration: 102 QT Interval:  403 QTC Calculation: 445 R Axis:   55 Text Interpretation: Atrial fibrillation when compared to prior, similar appearnce. NO STEMI Confirmed by Antony Blackbird [phone]) on 12/22/2021 6:04:03 PM  Radiology CT ANGIO HEAD NECK W WO CM  Result Date:  12/22/2021 CLINICAL DATA:  Confusion; aphasia; no acute infarct on MRI EXAM: CT ANGIOGRAPHY HEAD AND NECK TECHNIQUE: Multidetector CT imaging of the head and neck was performed using the standard protocol during bolus administration of intravenous contrast. Multiplanar CT image reconstructions and MIPs were obtained to evaluate the vascular anatomy. Carotid stenosis measurements (when applicable) are obtained utilizing NASCET criteria, using the distal internal carotid diameter as the denominator. RADIATION DOSE REDUCTION: This exam was performed according to the departmental dose-optimization program which includes automated exposure control, adjustment of the mA and/or kV according to patient size and/or use of iterative  reconstruction technique. CONTRAST:  5m OMNIPAQUE IOHEXOL 350 MG/ML SOLN COMPARISON:  02/16/2020 CTA head and neck FINDINGS: CT HEAD FINDINGS Brain: No evidence of acute infarct, hemorrhage, mass, mass effect, or midline shift. No hydrocephalus or extra-axial fluid collection. Periventricular white matter changes, likely the sequela of chronic small vessel ischemic disease. Cerebral volume is within normal limits for age. Vascular: No hyperdense vessel. Atherosclerotic calcifications in the intracranial carotid and vertebral arteries. Skull: Normal. Negative for fracture or focal lesion. Sinuses/Orbits: Mucous retention cyst in the left maxillary sinus. Mucosal thickening in the ethmoid air cells. Status post bilateral lens replacements. Other: The mastoid air cells are well aerated. CTA NECK FINDINGS Aortic arch: Standard branching. Imaged portion shows no evidence of aneurysm or dissection. No significant stenosis of the major arch vessel origins. Aortic atherosclerosis. Right carotid system: No evidence of dissection, occlusion, or hemodynamically significant stenosis (greater than 50%). Atherosclerotic disease at the bifurcation and in the proximal ICA is not hemodynamically significant.  Left carotid system: No evidence of dissection, occlusion, or hemodynamically significant stenosis (greater than 50%). Atherosclerotic disease at the bifurcation and in the proximal ICA is not hemodynamically significant. Vertebral arteries: No evidence of dissection, occlusion, or hemodynamically significant stenosis (greater than 50%). Skeleton: No acute osseous abnormality. Degenerative changes in the cervical spine. Other neck: Negative. Upper chest: Emphysema. No focal pulmonary opacity or pleural effusion. Review of the MIP images confirms the above findings CTA HEAD FINDINGS Anterior circulation: Both internal carotid arteries are patent to the termini, with mild stenosis in the bilateral cavernous and proximal supraclinoid segments. A1 segments patent. Normal anterior communicating artery. Anterior cerebral arteries are patent to their distal aspects. No M1 stenosis or occlusion. MCA branches perfused and symmetric. Posterior circulation: Vertebral arteries patent to the vertebrobasilar junction without stenosis. Posterior inferior cerebellar arteries patent proximally. Basilar patent to its distal aspect. Superior cerebellar arteries patent proximally. Patent P1 segments. Near fetal origin of the left PCA from a prominent left posterior communicating artery. PCAs perfused to their distal aspects without stenosis. The right posterior communicating artery is not definitively visualized. Venous sinuses: As permitted by contrast timing, patent. Anatomic variants: Near fetal origin of the left PCA. Review of the MIP images confirms the above findings IMPRESSION: 1. No acute intracranial process. 2. No intracranial large vessel occlusion. Mild stenosis in the bilateral cavernous and proximal supraclinoid ICAs. 3. No hemodynamically significant stenosis in the neck. 4. Emphysema and aortic atherosclerosis. Aortic Atherosclerosis (ICD10-I70.0) and Emphysema (ICD10-J43.9). Electronically Signed   By: AMerilyn Baba M.D.   On: 12/22/2021 23:23   MR BRAIN WO CONTRAST  Result Date: 12/22/2021 CLINICAL DATA:  TIA EXAM: MRI HEAD WITHOUT CONTRAST TECHNIQUE: Multiplanar, multiecho pulse sequences of the brain and surrounding structures were obtained without intravenous contrast. COMPARISON:  MRI head 02/16/2020 FINDINGS: Brain: Negative for acute infarct. Generalized atrophy. Moderate white matter changes with T2 and FLAIR hyperintensities in the cerebral white matter bilaterally. Brainstem and cerebellum normal. Chronic microhemorrhage in the left parietal. Negative for mass or edema Vascular: Normal arterial flow voids. Skull and upper cervical spine: Negative Sinuses/Orbits: Large retention cyst left maxillary sinus. Mild mucosal edema in the right maxillary and bilateral ethmoid sinuses. Bilateral cataract extraction Other: None IMPRESSION: 1. Negative for acute infarct. 2. Atrophy and moderate chronic microvascular ischemic change in the white matter. Electronically Signed   By: CFranchot GalloM.D.   On: 12/22/2021 20:00    Procedures Procedures    Medications Ordered in ED Medications  iohexol (OMNIPAQUE) 350  MG/ML injection 75 mL (75 mLs Intravenous Contrast Given 12/22/21 2301)    ED Course/ Medical Decision Making/ A&P                           Medical Decision Making Amount and/or Complexity of Data Reviewed Labs: ordered. Radiology: ordered.    William Norman is a 81 y.o. male with a past medical history significant for hypertension, dyslipidemia, CAD, previous DVT, previous stroke, previous TIA, AAA, CAD, persistent atrial fibrillation on Eliquis therapy that was held for 1 dose this morning who presents for transient speech abnormality.  According to patient, he is post to have an epidural injection in his back for chronic back pain this Friday and was told to hold his Eliquis starting today.  He took it last night but stopped this morning's dose.  He said that while at the grocery store,  he got to where he was not able to speak or express himself.  He described clearly a and expressive aphasia lasting approximately 45 minutes before it resolved.  He now feels at his baseline.  He says that his previous TIAs and strokes have been related to speech troubles.  He denies any numbness, tingling, or weakness of extremities.  Denies dizziness.  Denies new headache.  He reports some chronic neck soreness that is not changed from baseline.  No recent fevers, chills, congestion, cough, constipation, diarrhea, or urinary changes.  Denies other medication changes aside from holding Eliquis this morning.  Denies vision changes or facial droop.  On exam, lungs clear and chest nontender.  Abdomen nontender.  Patient moving all extremities.  Intact sensation, strength, and pulses in extremities.  Symmetric smile.  Pupils are symmetric and reactive with normal extract movements.  Speech was clear on my exam.  No carotid bruit on my exam initially.  Symmetric finger-nose-finger testing.  No ataxia.  Given the patient's history of previous stroke and TIA that was aphasia in nature by report, I am concerned about recurrence.  I spoke to neurology who recommended CTA head and neck and MRI brain without contrast.  If the patient had a stroke, would need to discuss Eliquis plan.  We will await imaging and then rediscussed with neurology.  10:43 PM Patient's MRI shows no acute stroke.  He is still waiting on the CTAs.  I spoke to neurology and as there is no acute stroke on MRI, they do feel him being safe getting back on his Eliquis if the CT is reassuring.  We will have a shared decision conversation with patient.  They do not feel he needs admission if his other imaging was reassuring.  11:30 PM CT chest returned without any evidence of acute intracranial process, no LVO, and no significant stenosis in the neck.  Per plan, patient be discharged home and will discuss with his team about starting Eliquis  again.  We offered him a dose but he wants to talk to his team tomorrow.  Patient understands return precautions and follow-up instructions and was discharged in good condition after having a TIA tonight with transient speech difficulties.        Final Clinical Impression(s) / ED Diagnoses Final diagnoses:  TIA (transient ischemic attack)  Transient speech disturbance   Clinical Impression: 1. TIA (transient ischemic attack)   2. Transient speech disturbance     Disposition: Discharge  Condition: Good  I have discussed the results, Dx and Tx plan with the pt(& family  if present). He/she/they expressed understanding and agree(s) with the plan. Discharge instructions discussed at great length. Strict return precautions discussed and pt &/or family have verbalized understanding of the instructions. No further questions at time of discharge.    New Prescriptions   No medications on file    Follow Up: Vivi Barrack, [place] [address] [place] [address] [id number] [place] [phone]    your neurologist            Mycah Mcdougall, Gwenyth Allegra, MD 12/22/21 [phone]

## 2021-12-22 NOTE — ED Notes (Signed)
Patient transported to MRI 

## 2021-12-22 NOTE — ED Notes (Signed)
Pt transported to MRI 

## 2021-12-22 NOTE — ED Triage Notes (Signed)
Pt BIB EMS for confusion that started earlier in the day at [facility]. Pt left there and continued to have increased confusion and became completely aphasic. Pt has a history of TIA and takes Eliquis. Symptoms have since resolved, no pain, no dizziness and no headache noted.   EMS Vitals 136/67 HR 70-90 in Afib 98% on room air CBG 152

## 2021-12-22 NOTE — Discharge Instructions (Signed)
Your history, exam, and work-up today are consistent with a TIA recurrence in the setting of holding the Eliquis for 1 day.  The MRI did not show evidence of acute stroke and the CT imaging did not show evidence of critical stenosis in the head or neck.  I do suspect that holding the Eliquis may have led to this TIA however your work-up otherwise was reassuring.  Neurology feel you are safe for discharge home and to restart her Eliquis but we recommend you speaking with your back doctor and PCP to confirm a plan.  If you are going to have the procedure on Friday as intended you will need to continue to hold it but there is risk about recurrent TIA or stroke as we discussed.  If any symptoms change or worsen acutely, please return to the nearest emergency department.

## 2021-12-23 ENCOUNTER — Telehealth: Payer: Self-pay | Admitting: Family Medicine

## 2021-12-23 NOTE — ED Notes (Signed)
[employer] called. Self pay. DC papers reviewed. Plan discussed. No questions or concerned.

## 2021-12-23 NOTE — Telephone Encounter (Signed)
I called William Norman.  Eliquis should be gone after 24 hours.  I dont think he needs to stop 72 hours prior to the injection. He last took eliquis today at 2am. I think he can have his ESI tomorrow and be off eliquis for 48 hours.

## 2021-12-23 NOTE — Telephone Encounter (Signed)
Pt is scheduled for epidural 11/10, was to STOP Eliquis for 3 days prior. He stopped Eliquis but then yesterday had an "episode" and ended up at the ED.  Pt states he did not have a stroke, but "episode" lasted about an hour and he felt like he could not communicate. He DID take his Eliquis yesterday per the ED provider's recommendation.  Epidural is still scheduled, pt looking for guidance.

## 2021-12-24 ENCOUNTER — Ambulatory Visit
Admission: RE | Admit: 2021-12-24 | Discharge: 2021-12-24 | Disposition: A | Discharge status: Home / Self Care | Payer: Medicare HMO | Source: Ambulatory Visit | Attending: Family Medicine | Admitting: Family Medicine

## 2021-12-24 DIAGNOSIS — M47817 Spondylosis without myelopathy or radiculopathy, lumbosacral region: Secondary | ICD-10-CM | POA: Diagnosis not present

## 2021-12-24 DIAGNOSIS — M5416 Radiculopathy, lumbar region: Secondary | ICD-10-CM

## 2021-12-24 MED ORDER — IOPAMIDOL (ISOVUE-M 200) INJECTION 41%
1.0000 mL | Freq: Once | INTRAMUSCULAR | Status: AC
  Administered 2021-12-24: 1 mL via EPIDURAL

## 2021-12-24 MED ORDER — METHYLPREDNISOLONE ACETATE 40 MG/ML INJ SUSP (RADIOLOG
80.0000 mg | Freq: Once | INTRAMUSCULAR | Status: AC
  Administered 2021-12-24: 80 mg via EPIDURAL

## 2021-12-24 NOTE — Discharge Instructions (Signed)

## 2021-12-30 ENCOUNTER — Other Ambulatory Visit: Payer: Self-pay | Admitting: Family Medicine

## 2022-01-03 ENCOUNTER — Ambulatory Visit: Payer: Medicare HMO | Admitting: Family Medicine

## 2022-01-04 ENCOUNTER — Ambulatory Visit (INDEPENDENT_AMBULATORY_CARE_PROVIDER_SITE_OTHER): Payer: Medicare HMO | Admitting: Family Medicine

## 2022-01-04 ENCOUNTER — Encounter: Payer: Self-pay | Admitting: Family Medicine

## 2022-01-04 ENCOUNTER — Telehealth: Payer: Medicare HMO

## 2022-01-04 VITALS — BP 133/76 | HR 80 | Temp 97.7°F | Ht 72.0 in | Wt 220.6 lb

## 2022-01-04 DIAGNOSIS — R351 Nocturia: Secondary | ICD-10-CM

## 2022-01-04 DIAGNOSIS — I4819 Other persistent atrial fibrillation: Secondary | ICD-10-CM

## 2022-01-04 DIAGNOSIS — I25118 Atherosclerotic heart disease of native coronary artery with other forms of angina pectoris: Secondary | ICD-10-CM | POA: Diagnosis not present

## 2022-01-04 DIAGNOSIS — I872 Venous insufficiency (chronic) (peripheral): Secondary | ICD-10-CM

## 2022-01-04 DIAGNOSIS — N401 Enlarged prostate with lower urinary tract symptoms: Secondary | ICD-10-CM

## 2022-01-04 DIAGNOSIS — E785 Hyperlipidemia, unspecified: Secondary | ICD-10-CM | POA: Diagnosis not present

## 2022-01-04 DIAGNOSIS — I10 Essential (primary) hypertension: Secondary | ICD-10-CM | POA: Diagnosis not present

## 2022-01-04 NOTE — Assessment & Plan Note (Signed)
Symptoms are not controlled.  He is following with urology for this and has using intermittent catheterization to control symptoms.  He may be considering a procedure such as  a TURP at some point in the future.

## 2022-01-04 NOTE — Patient Instructions (Signed)
It was very nice to see you today!  I will refer you to see the cardiologist.  We will not make any medication changes today.  Please continue to work on healthy diet and regular exercise.  We will see you back in 3 to 6 months for your annual physical.  Please come back to see [place] sooner if needed.  Take care, Dr [name]  PLEASE NOTE:  If you had any lab tests please let us know if you have not heard back within a few days. You may see your results on mychart before we have a chance to review them but we will give you a call once they are reviewed by [place]. If we ordered any referrals today, please let us know if you have not heard from their office within the next week.   Please try these tips to maintain a healthy lifestyle:  Eat at least 3 REAL meals and 1-2 snacks per day.  Aim for no more than 5 hours between eating.  If you eat breakfast, please do so within one hour of getting up.   Each meal should contain half fruits/vegetables, one quarter protein, and one quarter carbs (no bigger than a computer mouse)  Cut down on sweet beverages. This includes juice, soda, and sweet tea.   Drink at least 1 glass of water with each meal and aim for at least 8 glasses per day  Exercise at least 150 minutes every week.

## 2022-01-04 NOTE — Assessment & Plan Note (Addendum)
At goal today on 25 mg daily and losartan 25 mg daily.

## 2022-01-04 NOTE — Assessment & Plan Note (Signed)
He is on Lipitor 40 mg daily and Eliquis for PAF.  Will place referral to establish with cardiologist as above.

## 2022-01-04 NOTE — Progress Notes (Signed)
William Norman is a 81 y.o. male who presents today for an office visit.  Assessment/Plan:  New/Acute Problems: TIA Reassuring exam today and no recurrences over the last couple of weeks.  He does have a known history of paroxysmal atrial fibrillation and was off his anticoagulation for about 12 hours when this happened.  It is possible he could have had a small clot develop that caused the TIA.  He has already had brain MRI and CTA of neck in the ED which did not show any acute abnormalities.  He had lipids and A1c done a few months ago which were all at goal range.  He has an upcoming echocardiogram at the [place] in a few weeks.  He is back on Eliquis and has not missed any doses.  He is also on atorvastatin 40 mg daily.  At this point do not think we need to do any further work-up aside from his pending echocardiogram.  Discussed reasons to return to care or seek emergent care.  Chronic Problems Addressed Today: Persistent atrial fibrillation (HCC) Anticoagulated on Eliquis and rate controlled on atenolol.  He needs to establish care with a cardiologist since his previous one moved to [place].  Will place referral today.  Essential hypertension At goal today on 25 mg daily and losartan 25 mg daily.  CAD (coronary artery disease), native coronary artery He is on Lipitor 40 mg daily and Eliquis for PAF.  Will place referral to establish with cardiologist as above.  Chronic venous insufficiency Stable on furosemide 20 mg daily.  BPH associated with nocturia Symptoms are not controlled.  He is following with urology for this and has using intermittent catheterization to control symptoms.  He may be considering a procedure such as  a TURP at some point in the future.     Subjective:  HPI:  Patient here for ED follow-up.  Went to the ED on 12/22/2021 with an episode of aphasia.  He was told to hold his Eliquis due to an upcoming epidural steroid injection.  The morning he stopped the  medication he had an episode where he was unable to speak or express himself that lasted for about 45 minutes before spontaneous resolving.  Went to the ED for further evaluation.  He did have an MRI and CTA performed while in the ED which did not show any signs of acute stroke or vessel occlusions.  He was discharged home. He has not had any recurrence in symptoms since then.  He was told that he probably had a small TIA due to coming off the Eliquis.  He has been compliant with this since then.  Since her last visit he was also seen at the [place].  He was started on losartan 25 mg daily.  He has been doing well with this.  He is having a lot of issues with BPH and has been following with urologist for this.  He has been self catheterizing.  He may be interested in needing a TURP at some point the future.  He does know that he needs cardiac clearance for any potential future surgeries.  He was previously seeing Dr. Bettina Gavia however would like to see a cardiologist closer to home.           Objective:  Physical Exam: BP 133/76   Pulse 80   Temp 97.7 F (36.5 C) (Temporal)   Ht 6' (1.829 m)   Wt 220 lb 9.6 oz (100.1 kg)   SpO2 98%  BMI 29.92 kg/m   Gen: No acute distress, resting comfortably CV: Regular rate and rhythm with no murmurs appreciated Pulm: Normal work of breathing, clear to auscultation bilaterally with no crackles, wheezes, or rhonchi Neuro: Grossly normal, moves all extremities Psych: Normal affect and thought content      [name], MD 01/04/2022 12:07 PM

## 2022-01-04 NOTE — Assessment & Plan Note (Signed)
Stable on furosemide 20 mg daily.

## 2022-01-04 NOTE — Assessment & Plan Note (Signed)
Anticoagulated on Eliquis and rate controlled on atenolol.  He needs to establish care with a cardiologist since his previous one moved to [place].  Will place referral today.

## 2022-01-10 ENCOUNTER — Ambulatory Visit: Payer: Medicare HMO | Admitting: Family Medicine

## 2022-01-11 ENCOUNTER — Ambulatory Visit: Payer: Medicare HMO | Admitting: Pharmacist

## 2022-01-11 DIAGNOSIS — I4819 Other persistent atrial fibrillation: Secondary | ICD-10-CM

## 2022-01-11 DIAGNOSIS — E785 Hyperlipidemia, unspecified: Secondary | ICD-10-CM

## 2022-01-11 NOTE — Progress Notes (Signed)
Chronic Care Management Pharmacy Note  01/11/2022 Name:  William Norman MRN:  [id number] DOB:  31-Dec-1940  Summary: PharmD FU doing well post pissible TIA.  BP has been controlled and he has not had any more episodes since his injection.  Recommendations/Changes made from today's visit: No changes to meds  Plan: FU w me in 6 months or sooner if needed   Subjective: William Norman is an 81 y.o. year old male who is a primary patient of Vivi Barrack, MD.  The CCM team was consulted for assistance with disease management and care coordination needs.    Engaged with patient by telephone for follow up visit in response to provider referral for pharmacy case management and/or care coordination services.   Consent to Services:  The patient was given the following information about Chronic Care Management services today, agreed to services, and gave verbal consent: 1. CCM service includes personalized support from designated clinical staff supervised by the primary care provider, including individualized plan of care and coordination with other care providers 2. 24/7 contact phone numbers for assistance for urgent and routine care needs. 3. Service will only be billed when office clinical staff spend 20 minutes or more in a month to coordinate care. 4. Only one practitioner may furnish and bill the service in a calendar month. 5.The patient may stop CCM services at any time (effective at the end of the month) by phone call to the office staff. 6. The patient will be responsible for cost sharing (co-pay) of up to 20% of the service fee (after annual deductible is met). Patient agreed to services and consent obtained.  Patient Care Team: Vivi Barrack, MD as PCP - General (Family Medicine) Bettina Gavia Hilton Cork, MD as PCP - Cardiology (Cardiology) Jacolyn Reedy, MD as Consulting Physician (Cardiology) Rutherford Guys, MD as Consulting Physician (Ophthalmology) Bettina Gavia Hilton Cork,  MD as Consulting Physician (Cardiology) Edrick Kins, DPM as Consulting Physician (Podiatry) Lyndee Hensen, PT as Physical Therapist (Physical Therapy) Edythe Clarity, [facility] as Pharmacist (Pharmacist) Consuella Lose, MD as Consulting Physician (Neurosurgery)  Recent office visits: 11/20/20 [name]) - working on lifestyle mods for glucose.  Most recent A1c was 5.9  Recent consult visits: 11/25/20 [facility], Cardiology) - Volume overload improved, stable on current medication regimen.  Identified with NYHA Class I HF.  11/10/20 [facility], Cardiology) - reports SOB and fluid overload.  Continue to follow, BP is currently controlled.  Hospital visits:  12/22/21 (ED Visit) - speech abnormality possible TIA.  MRI showed no acute stroke, chest CT was clear.  This was after he stopped Eliquis X 1 dose for a procedure.  No stroke confirmed, resume Eliquis at direction of MD.  10/16/20 (ED, SOB) - concern for low O2, EKG unremarkable in ED, he was released to home based on these assessments.   Objective:  Lab Results  Component Value Date   CREATININE 1.11 12/22/2021   BUN 16 12/22/2021   GFR 78.96 08/24/2021   GFRNONAA >60 12/22/2021   GFRAA 97 12/03/2019   NA 141 12/22/2021   K 3.9 12/22/2021   CALCIUM 9.5 12/22/2021   CO2 26 12/22/2021   GLUCOSE 87 12/22/2021    Lab Results  Component Value Date/Time   HGBA1C 5.9 08/24/2021 02:43 PM   HGBA1C 5.9 07/20/2020 02:50 PM   GFR 78.96 08/24/2021 02:43 PM   GFR 78.53 07/20/2020 02:50 PM    Last diabetic Eye exam: No results found for: "HMDIABEYEEXA"  Last diabetic  Foot exam: No results found for: "HMDIABFOOTEX"   Lab Results  Component Value Date   CHOL 123 08/24/2021   HDL 53.20 08/24/2021   LDLCALC 55 08/24/2021   LDLDIRECT 131.5 04/22/2010   TRIG 72.0 08/24/2021   CHOLHDL 2 08/24/2021       Latest Ref Rng & Units 12/22/2021    6:06 PM 08/24/2021    2:43 PM 10/02/2020    4:26 PM  Hepatic Function  Total Protein 6.5  - 8.1 g/dL 6.1  6.9  6.5   Albumin 3.5 - 5.0 g/dL 3.6  4.1    AST 15 - 41 U/L 30  28  91   ALT 0 - 44 U/L 22  22  79   Alk Phosphatase 38 - 126 U/L 51  54    Total Bilirubin 0.3 - 1.2 mg/dL 1.0  1.0  0.7     Lab Results  Component Value Date/Time   TSH 2.62 08/24/2021 02:43 PM   TSH 3.18 07/20/2020 02:50 PM       Latest Ref Rng & Units 12/22/2021    6:06 PM 08/24/2021    2:43 PM 03/15/2021    2:18 PM  CBC  WBC 4.0 - 10.5 K/uL 5.6  7.1  8.8   Hemoglobin 13.0 - 17.0 g/dL 14.7  14.9  15.1   Hematocrit 39.0 - 52.0 % 44.3  45.2  48.5   Platelets 150 - 400 K/uL 183  186.0  215     No results found for: "VD25OH"  Clinical ASCVD: Yes  The ASCVD Risk score (Arnett DK, et al., 2019) failed to calculate for the following reasons:   The 2019 ASCVD risk score is only valid for ages 53 to 17   The patient has a prior MI or stroke diagnosis       01/04/2022   11:28 AM 10/04/2021    1:15 PM 08/24/2021    2:43 PM  Depression screen PHQ 2/9  Decreased Interest 1 0 1  Down, Depressed, Hopeless 1 0 1  PHQ - 2 Score 2 0 2  Altered sleeping 1 0 1  Tired, decreased energy 1 0 1  Change in appetite 1 0 1  Feeling bad or failure about yourself  1 0 1  Trouble concentrating 1 0 1  Moving slowly or fidgety/restless 1 0 1  Suicidal thoughts 0  0  PHQ-9 Score 8 0 8  Difficult doing work/chores Somewhat difficult Not difficult at all Somewhat difficult      Social History   Tobacco Use  Smoking Status Former   Packs/day: 1.00   Years: 60.00   Total pack years: 60.00   Types: Cigarettes   Quit date: 03/25/1998   Years since quitting: 23.8  Smokeless Tobacco Never   BP Readings from Last 3 Encounters:  01/04/22 133/76  12/24/21 (!) 151/85  12/22/21 (!) 160/88   Pulse Readings from Last 3 Encounters:  01/04/22 80  12/24/21 67  12/22/21 76   Wt Readings from Last 3 Encounters:  01/04/22 220 lb 9.6 oz (100.1 kg)  12/22/21 220 lb (99.8 kg)  12/06/21 219 lb (99.3 kg)   BMI  Readings from Last 3 Encounters:  01/04/22 29.92 kg/m  12/22/21 29.84 kg/m  12/06/21 29.70 kg/m    Assessment/Interventions: Review of patient past medical history, allergies, medications, health status, including review of consultants reports, laboratory and other test data, was performed as part of comprehensive evaluation and provision of chronic care management services.   SDOH:  (  Social Determinants of Health) assessments and interventions performed: No, done within the year Financial Resource Strain: Low Risk  (08/06/2021)   Overall Financial Resource Strain (CARDIA)    Difficulty of Paying Living Expenses: Not hard at all   Food Insecurity: No Food Insecurity (08/06/2021)   Hunger Vital Sign    Worried About Running Out of Food in the Last Year: Never true    Ran Out of Food in the Last Year: Never true    SDOH Interventions    Flowsheet Row Clinical Support from 08/06/2021 in [place]  SDOH Interventions   Depression Interventions/Treatment  Patient refuses Treatment  [pt stated not at this time]      Financial Resource Strain: Low Risk  (08/06/2021)   Overall Financial Resource Strain (CARDIA)    Difficulty of Paying Living Expenses: Not hard at all    [place]: No Food Insecurity (08/06/2021)  Housing: Low Risk  (08/06/2021)  Transportation Needs: No Transportation Needs (08/06/2021)  Depression (PHQ2-9): Medium Risk (01/04/2022)  Financial Resource Strain: Low Risk  (08/06/2021)  Physical Activity: Inactive (08/06/2021)  Social Connections: Moderately Isolated (08/06/2021)  Stress: Stress Concern Present (08/06/2021)  Tobacco Use: Medium Risk (01/04/2022)    CCM Care Plan  Allergies  Allergen Reactions   Nitrous Oxide Other (See Comments)    Pt reports he "passed out and almost died" after inhaling gas through mask at dentist office    Simvastatin Other (See Comments)    Muscle aches     Medications  Reviewed Today     Reviewed by Edythe Clarity, [facility] (Pharmacist) on 01/11/22 at 1426  Med List Status: <None>   Medication Order Taking? Sig Documenting Provider Last Dose Status Informant  acetaminophen (ACETAMINOPHEN 8 HOUR) 650 MG CR tablet [id number] Yes Take 1 tablet (650 mg total) by mouth every 8 (eight) hours as needed for pain.  Patient taking differently: Take 1,300 mg by mouth in the morning.   Vivi Barrack, MD Taking Active Self           Med Note Duffy Bruce, Deretha Emory Dec 22, 2021 10:15 PM) The patient takes 2 strengths of Tylenol at different times of the day  acetaminophen (TYLENOL) 500 MG tablet [id number] Yes Take 500 mg by mouth at bedtime. [provider] Taking Active Self           Med Note Duffy Bruce, Deretha Emory Dec 22, 2021 10:15 PM) The patient takes 2 strengths of Tylenol at different times of the day  apixaban (ELIQUIS) 5 MG TABS tablet [id number] Yes Take 1 tablet (5 mg total) by mouth 2 (two) times daily. Consuella Lose, MD Taking Active Self           Med Note Duffy Bruce, Deretha Emory Dec 22, 2021 10:13 PM) Last taken on 12/20/2021 @ 2300. Being held for this Friday's procedure. To resume in the evening on 12/24/2021.  atenolol (TENORMIN) 25 MG tablet [id number] Yes TAKE 1 TABLET (25 MG TOTAL) BY MOUTH DAILY. Vivi Barrack, MD Taking Active Self  atorvastatin (LIPITOR) 40 MG tablet [id number] Yes TAKE 1/2 TABLET BY MOUTH EVERY DAY  Patient taking differently: Take 20 mg by mouth at bedtime.   Vivi Barrack, MD Taking Active Self  Coenzyme Q10 (CO Q 10 PO) [id number] Yes Take 1 capsule by mouth in the morning. [provider] Taking Active Self  Cyanocobalamin (VITAMIN B-12) 1000 MCG SUBL [id number]  Yes Place 1 tablet (1,000 mcg total) under the tongue daily. Vivi Barrack, MD Taking Active Self  furosemide (LASIX) 20 MG tablet [id number] Yes Take 1 tablet (20 mg total) by mouth daily. Richardo Priest, MD Taking Active Self  hydroxypropyl  methylcellulose / hypromellose (ISOPTO TEARS / GONIOVISC) 2.5 % ophthalmic solution [id number] Yes Place 1 drop into both eyes in the morning. [provider] Taking Active Self  losartan (COZAAR) 50 MG tablet [id number] Yes Take 25 mg by mouth daily. [provider] Taking Active   nitroGLYCERIN (NITROSTAT) 0.4 MG SL tablet [id number] Yes Dissolve 1 tablet under the tongue every 5 minutes as needed  Patient taking differently: Place 0.4 mg under the tongue every 5 (five) minutes as needed for chest pain.   Richardo Priest, MD Taking Active Self  oxymetazoline (AFRIN) 0.05 % nasal spray [id number] Yes Place 2-4 sprays into both nostrils at bedtime. [provider] Taking Active Self  tamsulosin (FLOMAX) 0.4 MG CAPS capsule [id number] Yes TAKE 1 CAPSULE BY MOUTH EVERY DAY Vivi Barrack, MD Taking Active             Patient Active Problem List   Diagnosis Date Noted   Depression, major, single episode, mild ([place]) 08/24/2021   Osteoarthritis 11/20/2020   Constipation 10/02/2020   Protein-calorie malnutrition, severe 09/03/2020   SBO (small bowel obstruction) ([place]) 08/28/2020   AAA (abdominal aortic aneurysm) ([place]) 03/06/2020   Porokeratosis 02/25/2020   History of ischemic stroke 02/16/2020   Eczema    DVT (deep venous thrombosis) (HCC)    CAD (coronary artery disease)    Recurrent UTI 08/14/2019   Hyperglycemia 07/08/2019   Persistent atrial fibrillation ([place]) 04/10/2018   Chronic dermatitis of hands 10/12/2015   Soft tissue lesion of foot 08/27/2015   Stroke ([place]) 06/18/2015   BPH associated with nocturia 08/29/2013   CAD (coronary artery disease), native coronary artery    Long term current use of anticoagulant therapy    Personal history of DVT (deep vein thrombosis)    Chronic venous insufficiency    Erectile dysfunction 07/11/2012   Lumbar radiculopathy    GERD 08/04/2006   Dyslipidemia    Essential hypertension     Immunization History   Administered Date(s) Administered   Covid-19, Mrna,Vaccine(Spikevax)40yr and older 11/17/2021   Fluad Quad(high Dose 65+) 10/15/2018, 09/29/2021   Influenza Split 02/14/2005, 11/20/2007, 11/14/2009, 11/16/2012, 10/10/2013, 10/21/2014, 10/12/2015, 11/23/2017   Influenza Whole 02/14/2005, 11/20/2007, 11/14/2009   Influenza, High Dose Seasonal PF 10/21/2014, 10/12/2015, 11/23/2017, 10/15/2018, 11/06/2019   Influenza,inj,Quad PF,6+ Mos 10/10/2013   Influenza-Unspecified 02/21/2008, 11/16/2012, 10/10/2013, 10/21/2014, 10/12/2015, 11/23/2017, 11/16/2020, 10/15/2021   PFIZER Comirnaty(Gray Top)Covid-19 Tri-Sucrose Vaccine 05/20/2020   PFIZER(Purple Top)SARS-COV-2 Vaccination 03/01/2019, 03/21/2019, 11/11/2019, 05/20/2020   PNEUMOCOCCAL CONJUGATE-20 03/20/2021   Pfizer Covid-19 Vaccine Bivalent Booster 19yr& up 11/23/2020, 08/08/2021   Pneumococcal Conjugate-13 10/21/2014   Pneumococcal Polysaccharide-23 02/15/2003, 02/18/2008   Td 02/15/2004, 10/21/2014   Tdap 02/15/2004, 10/21/2014   Unspecified SARS-COV-2 Vaccination 11/17/2021   Zoster Recombinat (Shingrix) 04/15/2019, 06/17/2019   Zoster, Live 03/09/2010   Zoster, Unspecified 04/15/2019, 06/17/2019    Conditions to be addressed/monitored:  HTN, CAD, Afib, GERD, HLD, BPH, Hyperglycemia  Care Plan : General Pharmacy (Adult)  Updates made by DaEdythe ClarityRPH since 01/11/2022 12:00 AM     Problem: HTN, CAD, Afib, GERD, HLD, BPH, Hyperglycemia   Priority: High  Onset Date: 12/22/2020     Long-Range Goal: Patient-Specific Goal   Start Date: 12/22/2020  Expected End  Date: 06/22/2021  Recent Progress: On track  Priority: High  Note:   Current Barriers:  Recent TIA?  Pharmacist Clinical Goal(s):  Patient will achieve control of BP as evidenced by monitoring through collaboration with PharmD and provider.   Interventions: 1:1 collaboration with Vivi Barrack, MD regarding development and update of comprehensive plan of  care as evidenced by provider attestation and co-signature Inter-disciplinary care team collaboration (see longitudinal plan of care) Comprehensive medication review performed; medication list updated in electronic medical record  Hypertension (BP goal <130/80) -Controlled -Current treatment: Atenolol 46m Appropriate, Effective, Safe, Accessible -Medications previously tried: HCTZ (d/c)  -Current home readings: not checking at home, last few office BP elevated -Current exercise habits: minimal -Denies hypotensive/hypertensive symptoms -Educated on BP goals and benefits of medications for prevention of heart attack, stroke and kidney damage; Daily salt intake goal < 2300 mg; Importance of home blood pressure monitoring; -Counseled to monitor BP at home as able, document, and provide log at future appointments -Recommend to keep an eye on BP - he reports recently d/c HCTZ and decreased his atenolol dose.  He has been in pain which could be also contributing to increased BP. -Recommended to continue current medication Monitor BP - could consider addition of HCTZ back if BP continues to be elevated.  Update 06/29/21 BP reviewed - he reports has been "good" Is still dealing with back/leg pain which could be contributing to elevations. He has MRI upcoming of his back - is also doing PT. Denies any dizziness or HA - no need for changes at this time. Continue to monitor, will have CMA check in periodically on BP/symptoms.  Hyperlipidemia: (LDL goal < 70) 01/11/22 -Controlled, most recent LDL is 55 -Current treatment: Atorvastatin 426mdaily Appropriate, Effective, Safe, Accessible -Medications previously tried: none noted  -Educated on Cholesterol goals;  Benefits of statin for ASCVD risk reduction; Importance of limiting foods high in cholesterol; Exercise goal of 150 minutes per week; -Recommended to continue current medication Continue routine screenings. Recent TIA, stoke was  negative.  Stressed importance of medication adherence. No changes at this time continue to monitor lipids on regular basis.  Update 06/29/21 Continues adherence with statin. Denies adverse effects LDL well controlled, last labs from June 2022. Continue current meds - due for fasting labs at next OV.  Atrial Fibrillation (Goal: prevent stroke and major bleeding) 01/11/22 -Controlled -CHADSVASC: 6 -Current treatment: Rate control: Atenolol 2587maily Appropriate, Effective, Safe, Accessible Anticoagulation: Eliquis 5mg49mD  Appropriate, Effective, Safe, Accessible -Medications previously tried: none noted -Home BP and HR readings: not checking at home, elevated in office  -Back on Eliquis with no concerns after his injection. It was during this time off he thinks he has this episode of TIA.  Risk vs. Benefits of holding Eliquis in the future. Patient to monitor for symptoms - all meds affordable at this time no concerns with access.  Hyperglycemia (Goal: Control A1c) -Controlled -Current treatment  None noted -Medications previously tried: none noted -Counseled on meaning of most recent A1c of 5.9 being in the lower end of the pre-diabetes range.  Just something to monitor for now.  -Counseled on diet and exercise extensively Recommended he try and watch carbs and excess sugars in his diet.  Explained sources of carbohydrates in the diet. Continue current management for now - routine A1c screenings.  BPH (Goal: Minimize symptoms) -Controlled -Current treatment  None noted -Medications previously tried: alfuzosin  -Continues to report nocturia, getting up 2-3 times per evening  to use the restroom.  He reports this is manageable currently. -Recommended continue current management for now, contact providers if symptoms worsen.  Patient Goals/Self-Care Activities Patient will:  - focus on medication adherence by pill counts check blood pressure periodically, document, and provide  at future appointments  Follow Up Plan: The care management team will reach out to the patient again over the next 180 days.                Medication Assistance: None required.  Patient affirms current coverage meets needs.  Compliance/Adherence/Medication fill history: Care Gaps: None at this time  Star-Rating Drugs: Atorvastatin 70m 10/31/21 90ds  Patient's preferred pharmacy is:  [id number] [id number]: [phone]: [phone] Uses pill box? No - prefers bottles Pt endorses 100% compliance  We discussed: Benefits of medication synchronization, packaging and delivery as well as enhanced pharmacist oversight with [employer]. Patient decided to: Continue current medication management strategy  Care Plan and Follow Up Patient Decision:  Patient agrees to Care Plan and Follow-up.  Plan: The care management team will reach out to the patient again over the next 180 days.  ChBeverly MilchPharmD Clinical Pharmacist  [phone]

## 2022-01-11 NOTE — Patient Instructions (Signed)
Visit Information   Goals Addressed   None    Patient Care Plan: General Pharmacy (Adult)     Problem Identified: HTN, CAD, Afib, GERD, HLD, BPH, Hyperglycemia   Priority: High  Onset Date: 12/22/2020     Long-Range Goal: Patient-Specific Goal   Start Date: 12/22/2020  Expected End Date: 06/22/2021  Recent Progress: On track  Priority: High  Note:   Current Barriers:  Possible elevated BP  Pharmacist Clinical Goal(s):  Patient will achieve control of BP as evidenced by monitoring through collaboration with PharmD and provider.   Interventions: 1:1 collaboration with Vivi Barrack, MD regarding development and update of comprehensive plan of care as evidenced by provider attestation and co-signature Inter-disciplinary care team collaboration (see longitudinal plan of care) Comprehensive medication review performed; medication list updated in electronic medical record  Hypertension (BP goal <130/80) -Controlled -Current treatment: Atenolol '25mg'$  Appropriate, Effective, Safe, Accessible -Medications previously tried: HCTZ (d/c)  -Current home readings: not checking at home, last few office BP elevated -Current exercise habits: minimal -Denies hypotensive/hypertensive symptoms -Educated on BP goals and benefits of medications for prevention of heart attack, stroke and kidney damage; Daily salt intake goal < 2300 mg; Importance of home blood pressure monitoring; -Counseled to monitor BP at home as able, document, and provide log at future appointments -Recommend to keep an eye on BP - he reports recently d/c HCTZ and decreased his atenolol dose.  He has been in pain which could be also contributing to increased BP. -Recommended to continue current medication Monitor BP - could consider addition of HCTZ back if BP continues to be elevated.  Update 06/29/21 BP reviewed - he reports has been "good" Is still dealing with back/leg pain which could be contributing to  elevations. He has MRI upcoming of his back - is also doing PT. Denies any dizziness or HA - no need for changes at this time. Continue to monitor, will have CMA check in periodically on BP/symptoms.  Hyperlipidemia: (LDL goal < 70) -Controlled -Current treatment: Atorvastatin '40mg'$  daily Appropriate, Effective, Safe, Accessible -Medications previously tried: none noted  -Most recent LDL is controlled at 25!  -Educated on Cholesterol goals;  Benefits of statin for ASCVD risk reduction; Importance of limiting foods high in cholesterol; Exercise goal of 150 minutes per week; -Recommended to continue current medication Continue routine screenings.  Update 06/29/21 Continues adherence with statin. Denies adverse effects LDL well controlled, last labs from June 2022. Continue current meds - due for fasting labs at next OV.  Atrial Fibrillation (Goal: prevent stroke and major bleeding) -Controlled -CHADSVASC: 6 -Current treatment: Rate control: Atenolol '25mg'$  daily Anticoagulation: Eliquis '5mg'$  BID -Medications previously tried: none noted -Home BP and HR readings: not checking at home, elevated in office  -Counseled on increased risk of stroke due to Afib and benefits of anticoagulation for stroke prevention; bleeding risk associated with Eliquis and importance of self-monitoring for signs/symptoms of bleeding; -Does not feel rapid HR - no chest pain, denies abnormal bleeding or bruising -Recommended to continue current medication  Hyperglycemia (Goal: Control A1c) -Controlled -Current treatment  None noted -Medications previously tried: none noted -Counseled on meaning of most recent A1c of 5.9 being in the lower end of the pre-diabetes range.  Just something to monitor for now.  -Counseled on diet and exercise extensively Recommended he try and watch carbs and excess sugars in his diet.  Explained sources of carbohydrates in the diet. Continue current management for now -  routine A1c screenings.  BPH (  Goal: Minimize symptoms) -Controlled -Current treatment  None noted -Medications previously tried: alfuzosin  -Continues to report nocturia, getting up 2-3 times per evening to use the restroom.  He reports this is manageable currently. -Recommended continue current management for now, contact providers if symptoms worsen.  Patient Goals/Self-Care Activities Patient will:  - focus on medication adherence by pill counts check blood pressure periodically, document, and provide at future appointments  Follow Up Plan: The care management team will reach out to the patient again over the next 180 days.           The patient verbalized understanding of instructions, educational materials, and care plan provided today and DECLINED offer to receive copy of patient instructions, educational materials, and care plan.  Telephone follow up appointment with pharmacy team member scheduled for: 6 months  Edythe Clarity, [place], PharmD Clinical Pharmacist  [facility] [phone]

## 2022-02-01 ENCOUNTER — Encounter: Payer: Self-pay | Admitting: Podiatry

## 2022-02-01 ENCOUNTER — Ambulatory Visit: Payer: Medicare HMO | Admitting: Podiatry

## 2022-02-01 DIAGNOSIS — M79676 Pain in unspecified toe(s): Secondary | ICD-10-CM | POA: Diagnosis not present

## 2022-02-01 DIAGNOSIS — B351 Tinea unguium: Secondary | ICD-10-CM

## 2022-02-01 DIAGNOSIS — I872 Venous insufficiency (chronic) (peripheral): Secondary | ICD-10-CM

## 2022-02-01 DIAGNOSIS — D689 Coagulation defect, unspecified: Secondary | ICD-10-CM

## 2022-02-01 DIAGNOSIS — Q828 Other specified congenital malformations of skin: Secondary | ICD-10-CM

## 2022-02-01 NOTE — Progress Notes (Signed)
This patient returns to my office for at risk foot care.  This patient requires this care by a professional since this patient will be at risk due to having venous insufficiency and coagulation defect.  Patient is taking coumadin.  This patient is unable to cut nails himself since the patient cannot reach his nails.These nails are painful walking and wearing shoes.This patient presents for at risk foot care today.  General Appearance  Alert, conversant and in no acute stress.  Vascular  Dorsalis pedis and posterior tibial  pulses are palpable  bilaterally.  Capillary return is within normal limits  bilaterally. Temperature is within normal limits  bilaterally.  Neurologic  Senn-Weinstein monofilament wire test within normal limits  bilaterally. Muscle power within normal limits bilaterally.  Nails Thick disfigured discolored nails with subungual debris  from hallux to fifth toes bilaterally. No evidence of bacterial infection or drainage bilaterally.   Orthopedic  No limitations of motion  feet .  No crepitus or effusions noted.  No bony pathology or digital deformities noted.  Skin  normotropic skin  noted bilaterally.  No signs of infections or ulcers noted.   Asymptomatic porokeratosis sub 5th met left foot.  Onychomycosis  Pain in right toes  Pain in left toes    Consent was obtained for treatment procedures.   Mechanical debridement of nails 1-5  bilaterally performed with a nail nipper.  Filed with dremel without incident.    Return office visit   12  weeks                  Told patient to return for periodic foot care and evaluation due to potential at risk complications.   Gardiner Barefoot DPM

## 2022-02-22 ENCOUNTER — Ambulatory Visit (HOSPITAL_BASED_OUTPATIENT_CLINIC_OR_DEPARTMENT_OTHER): Payer: Medicare HMO | Admitting: Cardiology

## 2022-02-22 NOTE — Progress Notes (Incomplete)
Cardiology Office Note:    Date:  02/22/2022   ID:  William Norman, DOB February 11, 1941, MRN [id number]  PCP:  Vivi Barrack, MD  Cardiologist:  Shirlee More, MD  Referring MD: Vivi Barrack, MD   No chief complaint on file.   History of Present Illness:    William Norman is a 82 y.o. male with a hx of chronic atrial fibrillation with chronic anticoagulation hypertensive heart disease with heart failure, hyperlipidemia, CAD with remote PCI to the LAD in 1992, history of DVT, acute embolic stroke with withdrawal of chronic anticoagulant therapy, and recurrent small bowel obstruction with surgical intervention IVR drainage of hematoma with decompensated heart failure due to fluid overload from hospitalization. he presents for follow up***  Today, the patient states that he  He denies any palpitations, chest pain, shortness of breath, or peripheral edema. No lightheadedness, headaches, syncope, orthopnea, or PND.  (+)  Past Medical History:  Diagnosis Date   BPH (benign prostatic hypertrophy)    BPH associated with nocturia 08/29/2013   BPH with obstruction/lower urinary tract symptoms 08/14/2019   CAD (coronary artery disease)    CAD (coronary artery disease), native coronary artery    PTCA of RCA 1986, PTCA of LAD 1992,  Negative treadmill Cardiolite 2011    Chronic dermatitis of hands 10/12/2015   Chronic venous insufficiency    Coagulation disorder ([place]) 10/26/2018   DVT (deep venous thrombosis) (HCC)    Dyslipidemia    Dysrhythmia    Eczema    Erectile dysfunction 07/11/2012   Essential hypertension    GERD 08/04/2006       GERD (gastroesophageal reflux disease)    Hyperglycemia 07/08/2019   Hyperlipidemia    Hypertension    Incomplete emptying of bladder 08/14/2019   Long term current use of anticoagulant therapy    Lumbar disc disease    Overweight (BMI 25.0-29.9) 06/18/2015   Persistent atrial fibrillation ([place]) 04/10/2018   Personal history of DVT  (deep vein thrombosis)    Initially in 1998 with recurrence in 2002 now on chronic warfarin    Soft tissue lesion of foot 08/27/2015   Stroke [facility])    Urinary tract infection with hematuria 08/14/2019    Past Surgical History:  Procedure Laterality Date   APPLICATION OF WOUND VAC  09/02/2020   Procedure: APPLICATION OF WOUND VAC;  Surgeon: Donnie Mesa, MD;  Location: [place];  Service: General;;   CARDIAC CATHETERIZATION     CARPAL TUNNEL RELEASE  04/21/2011   Procedure: CARPAL TUNNEL RELEASE;  Surgeon: Tennis Must, MD;  Location: [place];  Service: Orthopedics;  Laterality: Left;   CARPAL TUNNEL RELEASE  05/23/2011   Procedure: CARPAL TUNNEL RELEASE;  Surgeon: Tennis Must, MD;  Location: [place];  Service: Orthopedics;  Laterality: Right;   COLON SURGERY  2010 and 2011 colostomy reversal   CORONARY ANGIOPLASTY     EYE SURGERY     repair of macular hole   LAPAROTOMY N/A 09/02/2020   Procedure: EXPLORATORY LAPAROTOMY, LYSIS OF ADHESIONS ,BOWEL RESECTION;  Surgeon: Donnie Mesa, MD;  Location: [place];  Service: General;  Laterality: N/A;   LUMBAR LAMINECTOMY/DECOMPRESSION MICRODISCECTOMY Left 03/18/2021   Procedure: LAMINOTOMY, FORAMINOTOMY, LEFT LUMBAR THREE-FOUR;  Surgeon: Consuella Lose, MD;  Location: [place];  Service: Neurosurgery;  Laterality: Left;   NASAL SEPTUM SURGERY     precutaneous transluminal coronary angioplasty     right hernia     TONSILLECTOMY AND ADENOIDECTOMY  Current Medications: Current Outpatient Medications on File Prior to Visit  Medication Sig   acetaminophen (ACETAMINOPHEN 8 HOUR) 650 MG CR tablet Take 1 tablet (650 mg total) by mouth every 8 (eight) hours as needed for pain. (Patient taking differently: Take 1,300 mg by mouth in the morning.)   acetaminophen (TYLENOL) 500 MG tablet Take 500 mg by mouth at bedtime.   apixaban (ELIQUIS) 5 MG TABS tablet Take 1 tablet (5 mg total) by mouth 2 (two) times daily.    atenolol (TENORMIN) 25 MG tablet TAKE 1 TABLET (25 MG TOTAL) BY MOUTH DAILY.   atorvastatin (LIPITOR) 40 MG tablet TAKE 1/2 TABLET BY MOUTH EVERY DAY (Patient taking differently: Take 20 mg by mouth at bedtime.)   Coenzyme Q10 (CO Q 10 PO) Take 1 capsule by mouth in the morning.   Cyanocobalamin (VITAMIN B-12) 1000 MCG SUBL Place 1 tablet (1,000 mcg total) under the tongue daily.   furosemide (LASIX) 20 MG tablet Take 1 tablet (20 mg total) by mouth daily.   hydroxypropyl methylcellulose / hypromellose (ISOPTO TEARS / GONIOVISC) 2.5 % ophthalmic solution Place 1 drop into both eyes in the morning.   losartan (COZAAR) 50 MG tablet Take 25 mg by mouth daily.   nitroGLYCERIN (NITROSTAT) 0.4 MG SL tablet Dissolve 1 tablet under the tongue every 5 minutes as needed (Patient taking differently: Place 0.4 mg under the tongue every 5 (five) minutes as needed for chest pain.)   oxymetazoline (AFRIN) 0.05 % nasal spray Place 2-4 sprays into both nostrils at bedtime.   tamsulosin (FLOMAX) 0.4 MG CAPS capsule TAKE 1 CAPSULE BY MOUTH EVERY DAY   No current facility-administered medications on file prior to visit.     Allergies:   Nitrous oxide and Simvastatin   Social History   Tobacco Use   Smoking status: Former    Packs/day: 1.00    Years: 60.00    Total pack years: 60.00    Types: Cigarettes    Quit date: 03/25/1998    Years since quitting: 23.9   Smokeless tobacco: Never  Vaping Use   Vaping Use: Never used  Substance Use Topics   Alcohol use: Not Currently    Comment: once a month   Drug use: No    Family History: family history includes AAA (abdominal aortic aneurysm) in his brother; Atrial fibrillation in his father and mother; Heart attack in his father; Heart disease in his father and mother; Stroke in his mother.  ROS:   Please see the history of present illness.    Additional pertinent ROS otherwise unremarkable   EKGs/Labs/Other Studies Reviewed:    The following studies  were reviewed today: ***  EKG:  EKG is personally reviewed.   02/22/2022:  Recent Labs: 08/24/2021: TSH 2.62 12/22/2021: ALT 22; BUN 16; Creatinine, Ser 1.11; Hemoglobin 14.7; Platelets 183; Potassium 3.9; Sodium 141  Recent Lipid Panel    Component Value Date/Time   CHOL 123 08/24/2021 1443   CHOL 130 12/03/2019 1509   TRIG 72.0 08/24/2021 1443   TRIG 68 01/10/2006 1108   HDL 53.20 08/24/2021 1443   HDL 48 12/03/2019 1509   CHOLHDL 2 08/24/2021 1443   VLDL 14.4 08/24/2021 1443   LDLCALC 55 08/24/2021 1443   LDLCALC 66 12/03/2019 1509   LDLDIRECT 131.5 04/22/2010 1018    Physical Exam:    VS:  There were no vitals taken for this visit.    Wt Readings from Last 3 Encounters:  01/04/22 220 lb 9.6 oz (100.1  kg)  12/22/21 220 lb (99.8 kg)  12/06/21 219 lb (99.3 kg)    GEN: Well nourished, well developed in no acute distress HEENT: Normal, moist mucous membranes NECK: No JVD CARDIAC: regular rhythm, normal S1 and S2, no rubs or gallops. No murmur. VASCULAR: Radial and DP pulses 2+ bilaterally. No carotid bruits RESPIRATORY:  Clear to auscultation without rales, wheezing or rhonchi  ABDOMEN: Soft, non-tender, non-distended MUSCULOSKELETAL:  Ambulates independently SKIN: Warm and dry, no edema NEUROLOGIC:  Alert and oriented x 3. No focal neuro deficits noted. PSYCHIATRIC:  Normal affect    ASSESSMENT:    No diagnosis found. PLAN:     Cardiac risk counseling and prevention recommendations: -recommend heart healthy/Mediterranean diet, with whole grains, fruits, vegetable, fish, lean meats, nuts, and olive oil. Limit salt. -recommend moderate walking, 3-5 times/week for 30-50 minutes each session. Aim for at least 150 minutes.week. Goal should be pace of 3 miles/hours, or walking 1.5 miles in 30 minutes -recommend avoidance of tobacco products. Avoid excess alcohol. -ASCVD risk score: The ASCVD Risk score (Arnett DK, et al., 2019) failed to calculate for the following  reasons:   The 2019 ASCVD risk score is only valid for ages 65 to 31   The patient has a prior MI or stroke diagnosis    Plan for follow up: ***  Buford Dresser, MD, PhD, [facility]    Medication Adjustments/Labs and Tests Ordered: Current medicines are reviewed at length with the patient today.  Concerns regarding medicines are outlined above.  No orders of the defined types were placed in this encounter.  No orders of the defined types were placed in this encounter.  There are no Patient Instructions on file for this visit.   I,Coren O'Brien,acting as a Education administrator for [employer], MD.,have documented all relevant documentation on the behalf of Buford Dresser, MD,as directed by  Buford Dresser, MD while in the presence of Buford Dresser, MD.  **  Signed, Buford Dresser, MD PhD 02/22/2022 11:39 AM    [place]

## 2022-02-24 ENCOUNTER — Ambulatory Visit: Payer: Medicare HMO | Admitting: Family Medicine

## 2022-03-07 ENCOUNTER — Other Ambulatory Visit: Payer: Self-pay | Admitting: Family Medicine

## 2022-03-08 ENCOUNTER — Telehealth: Payer: Self-pay | Admitting: Pharmacist

## 2022-03-08 NOTE — Progress Notes (Unsigned)
Erroneous Encounter

## 2022-03-10 ENCOUNTER — Encounter: Payer: Self-pay | Admitting: Family Medicine

## 2022-03-10 ENCOUNTER — Ambulatory Visit (INDEPENDENT_AMBULATORY_CARE_PROVIDER_SITE_OTHER): Payer: Medicare Other | Admitting: Family Medicine

## 2022-03-10 VITALS — BP 127/75 | HR 74 | Temp 97.5°F | Ht 72.0 in | Wt 225.2 lb

## 2022-03-10 DIAGNOSIS — Z0001 Encounter for general adult medical examination with abnormal findings: Secondary | ICD-10-CM | POA: Diagnosis not present

## 2022-03-10 DIAGNOSIS — I10 Essential (primary) hypertension: Secondary | ICD-10-CM

## 2022-03-10 DIAGNOSIS — N401 Enlarged prostate with lower urinary tract symptoms: Secondary | ICD-10-CM

## 2022-03-10 DIAGNOSIS — F320 Major depressive disorder, single episode, mild: Secondary | ICD-10-CM | POA: Diagnosis not present

## 2022-03-10 DIAGNOSIS — I25118 Atherosclerotic heart disease of native coronary artery with other forms of angina pectoris: Secondary | ICD-10-CM

## 2022-03-10 DIAGNOSIS — E785 Hyperlipidemia, unspecified: Secondary | ICD-10-CM

## 2022-03-10 DIAGNOSIS — I714 Abdominal aortic aneurysm, without rupture, unspecified: Secondary | ICD-10-CM | POA: Diagnosis not present

## 2022-03-10 DIAGNOSIS — I4819 Other persistent atrial fibrillation: Secondary | ICD-10-CM

## 2022-03-10 DIAGNOSIS — R739 Hyperglycemia, unspecified: Secondary | ICD-10-CM

## 2022-03-10 DIAGNOSIS — R351 Nocturia: Secondary | ICD-10-CM

## 2022-03-10 NOTE — Patient Instructions (Signed)
It was very nice to see you today!  I am glad that you are doing well.   We will get an ultrasound on your abdominal aorta.  No other changes today.  I will see back in 6 months.  Come back sooner if needed.  Take care, Dr [name]  PLEASE NOTE:  If you had any lab tests, please let us know if you have not heard back within a few days. You may see your results on mychart before we have a chance to review them but we will give you a call once they are reviewed by [place].   If we ordered any referrals today, please let us know if you have not heard from their office within the next week.   If you had any urgent prescriptions sent in today, please check with the pharmacy within an hour of our visit to make sure the prescription was transmitted appropriately.   Please try these tips to maintain a healthy lifestyle:  Eat at least 3 REAL meals and 1-2 snacks per day.  Aim for no more than 5 hours between eating.  If you eat breakfast, please do so within one hour of getting up.   Each meal should contain half fruits/vegetables, one quarter protein, and one quarter carbs (no bigger than a computer mouse)  Cut down on sweet beverages. This includes juice, soda, and sweet tea.   Drink at least 1 glass of water with each meal and aim for at least 8 glasses per day  Exercise at least 150 minutes every week.    Preventive Care 61 Years and Older, Male Preventive care refers to lifestyle choices and visits with your health care provider that can promote health and wellness. Preventive care visits are also called wellness exams. What can I expect for my preventive care visit? Counseling During your preventive care visit, your health care provider may ask about your: Medical history, including: Past medical problems. Family medical history. History of falls. Current health, including: Emotional well-being. Home life and relationship well-being. Sexual activity. Memory and ability to  understand (cognition). Lifestyle, including: Alcohol, nicotine or tobacco, and drug use. Access to firearms. Diet, exercise, and sleep habits. Work and work Statistician. Sunscreen use. Safety issues such as seatbelt and bike helmet use. Physical exam Your health care provider will check your: Height and weight. These may be used to calculate your BMI (body mass index). BMI is a measurement that tells if you are at a healthy weight. Waist circumference. This measures the distance around your waistline. This measurement also tells if you are at a healthy weight and may help predict your risk of certain diseases, such as type 2 diabetes and high blood pressure. Heart rate and blood pressure. Body temperature. Skin for abnormal spots. What immunizations do I need?  Vaccines are usually given at various ages, according to a schedule. Your health care provider will recommend vaccines for you based on your age, medical history, and lifestyle or other factors, such as travel or where you work. What tests do I need? Screening Your health care provider may recommend screening tests for certain conditions. This may include: Lipid and cholesterol levels. Diabetes screening. This is done by checking your blood sugar (glucose) after you have not eaten for a while (fasting). Hepatitis C test. Hepatitis B test. HIV (human immunodeficiency virus) test. STI (sexually transmitted infection) testing, if you are at risk. Lung cancer screening. Colorectal cancer screening. Prostate cancer screening. Abdominal aortic aneurysm (AAA) screening. You  may need this if you are a current or former smoker. Talk with your health care provider about your test results, treatment options, and if necessary, the need for more tests. Follow these instructions at home: Eating and drinking  Eat a diet that includes fresh fruits and vegetables, whole grains, lean protein, and low-fat dairy products. Limit your intake of  foods with high amounts of sugar, saturated fats, and salt. Take vitamin and mineral supplements as recommended by your health care provider. Do not drink alcohol if your health care provider tells you not to drink. If you drink alcohol: Limit how much you have to 0-2 drinks a day. Know how much alcohol is in your drink. In the [place] one drink equals one 12 oz bottle of beer (355 mL), one 5 oz glass of wine (148 mL), or one 1 oz glass of hard liquor (44 mL). Lifestyle Brush your teeth every morning and night with fluoride toothpaste. Floss one time each day. Exercise for at least 30 minutes 5 or more days each week. Do not use any products that contain nicotine or tobacco. These products include cigarettes, chewing tobacco, and vaping devices, such as e-cigarettes. If you need help quitting, ask your health care provider. Do not use drugs. If you are sexually active, practice safe sex. Use a condom or other form of protection to prevent STIs. Take aspirin only as told by your health care provider. Make sure that you understand how much to take and what form to take. Work with your health care provider to find out whether it is safe and beneficial for you to take aspirin daily. Ask your health care provider if you need to take a cholesterol-lowering medicine (statin). Find healthy ways to manage stress, such as: Meditation, yoga, or listening to music. Journaling. Talking to a trusted person. Spending time with friends and family. Safety Always wear your seat belt while driving or riding in a vehicle. Do not drive: If you have been drinking alcohol. Do not ride with someone who has been drinking. When you are tired or distracted. While texting. If you have been using any mind-altering substances or drugs. Wear a helmet and other protective equipment during sports activities. If you have firearms in your house, make sure you follow all gun safety procedures. Minimize exposure to UV  radiation to reduce your risk of skin cancer. What's next? Visit your health care provider once a year for an annual wellness visit. Ask your health care provider how often you should have your eyes and teeth checked. Stay up to date on all vaccines. This information is not intended to replace advice given to you by your health care provider. Make sure you discuss any questions you have with your health care provider. Document Revised: 07/29/2020 Document Reviewed: 07/29/2020 Elsevier Patient Education  [place].

## 2022-03-10 NOTE — Progress Notes (Signed)
Chief Complaint:  William Norman is a 82 y.o. male who presents today for his annual comprehensive physical exam.    Assessment/Plan:  Chronic Problems Addressed Today: AAA (abdominal aortic aneurysm) ([place]) Needs repeat US. Will order today.   Dyslipidemia Last LDL 55.  Continue Lipitor 20 mg daily.  Recheck again in 6 to 12 months.  He is tolerating well.  Essential hypertension Blood pressure at goal today on atenolol 25 mg daily and losartan 25 mg daily.  CAD (coronary artery disease), native coronary artery Follows with cardiology.  On Lipitor 20 mg daily and anticoagulated on Eliquis for paroxysmal atrial fibrillation.  BPH associated with nocturia Follows with urology at the [place].  Currently on Flomax.  Self caths twice daily.  Overall symptoms are stable.  Persistent atrial fibrillation (HCC) Rate controlled on atenolol 25 mg daily and anticoagulated on Eliquis.  Hyperglycemia Last A1c 5.9.  Deferred checking labs today.  Will recheck again in 6 to 12 months.  Discussed lifestyle modifications.  Preventative Healthcare: Up-to-date on vaccines.  No longer needs cancer screening due to age.  We discussed checking labs today however he deferred.  Recheck again in 6 to 12 months.  Patient Counseling(The following topics were reviewed and/or handout was given):  -Nutrition: Stressed importance of moderation in sodium/caffeine intake, saturated fat and cholesterol, caloric balance, sufficient intake of fresh fruits, vegetables, and fiber.  -Stressed the importance of regular exercise.   -Substance Abuse: Discussed cessation/primary prevention of tobacco, alcohol, or other drug use; driving or other dangerous activities under the influence; availability of treatment for abuse.   -Injury prevention: Discussed safety belts, safety helmets, smoke detector, smoking near bedding or upholstery.   -Sexuality: Discussed sexually transmitted diseases, partner selection, use of  condoms, avoidance of unintended pregnancy and contraceptive alternatives.   -Dental health: Discussed importance of regular tooth brushing, flossing, and dental visits.  -Health maintenance and immunizations reviewed. Please refer to Health maintenance section.  Return to care in 1 year for next preventative visit.     Subjective:  HPI:  He has no acute complaints today. See A/p for status of chronic conditions.   Lifestyle Diet: None specific.  Exercise: Limited but tries to walk routinely.      03/10/2022   12:56 PM  Depression screen PHQ 2/9  Decreased Interest 0  Down, Depressed, Hopeless 0  PHQ - 2 Score 0   ROS: Per HPI, otherwise a complete review of systems was negative.   PMH:  The following were reviewed and entered/updated in epic: Past Medical History:  Diagnosis Date   BPH (benign prostatic hypertrophy)    BPH associated with nocturia 08/29/2013   BPH with obstruction/lower urinary tract symptoms 08/14/2019   CAD (coronary artery disease)    CAD (coronary artery disease), native coronary artery    PTCA of RCA 1986, PTCA of LAD 1992,  Negative treadmill Cardiolite 2011    Chronic dermatitis of hands 10/12/2015   Chronic venous insufficiency    Coagulation disorder ([place]) 10/26/2018   DVT (deep venous thrombosis) (HCC)    Dyslipidemia    Dysrhythmia    Eczema    Erectile dysfunction 07/11/2012   Essential hypertension    GERD 08/04/2006       GERD (gastroesophageal reflux disease)    Hyperglycemia 07/08/2019   Hyperlipidemia    Hypertension    Incomplete emptying of bladder 08/14/2019   Long term current use of anticoagulant therapy    Lumbar disc disease    Overweight (  BMI 25.0-29.9) 06/18/2015   Persistent atrial fibrillation ([place]) 04/10/2018   Personal history of DVT (deep vein thrombosis)    Initially in 1998 with recurrence in 2002 now on chronic warfarin    Soft tissue lesion of foot 08/27/2015   Stroke [facility])    Urinary tract infection with  hematuria 08/14/2019   Patient Active Problem List   Diagnosis Date Noted   Depression, major, single episode, mild ([place]) 08/24/2021   Osteoarthritis 11/20/2020   Constipation 10/02/2020   Protein-calorie malnutrition, severe 09/03/2020   SBO (small bowel obstruction) ([place]) 08/28/2020   AAA (abdominal aortic aneurysm) ([place]) 03/06/2020   History of ischemic stroke 02/16/2020   Recurrent UTI 08/14/2019   Hyperglycemia 07/08/2019   Persistent atrial fibrillation ([place]) 04/10/2018   Chronic dermatitis of hands 10/12/2015   Soft tissue lesion of foot 08/27/2015   Stroke ([place]) 06/18/2015   BPH associated with nocturia 08/29/2013   CAD (coronary artery disease), native coronary artery    Long term current use of anticoagulant therapy    Personal history of DVT (deep vein thrombosis)    Chronic venous insufficiency    Erectile dysfunction 07/11/2012   Lumbar radiculopathy    Dyslipidemia    Essential hypertension    Past Surgical History:  Procedure Laterality Date   APPLICATION OF WOUND VAC  09/02/2020   Procedure: APPLICATION OF WOUND VAC;  Surgeon: Donnie Mesa, MD;  Location: [place];  Service: General;;   CARDIAC CATHETERIZATION     CARPAL TUNNEL RELEASE  04/21/2011   Procedure: CARPAL TUNNEL RELEASE;  Surgeon: Tennis Must, MD;  Location: [place];  Service: Orthopedics;  Laterality: Left;   CARPAL TUNNEL RELEASE  05/23/2011   Procedure: CARPAL TUNNEL RELEASE;  Surgeon: Tennis Must, MD;  Location: [place];  Service: Orthopedics;  Laterality: Right;   COLON SURGERY  2010 and 2011 colostomy reversal   CORONARY ANGIOPLASTY     EYE SURGERY     repair of macular hole   LAPAROTOMY N/A 09/02/2020   Procedure: EXPLORATORY LAPAROTOMY, LYSIS OF ADHESIONS ,BOWEL RESECTION;  Surgeon: Donnie Mesa, MD;  Location: [place];  Service: General;  Laterality: N/A;   LUMBAR LAMINECTOMY/DECOMPRESSION MICRODISCECTOMY Left 03/18/2021   Procedure: LAMINOTOMY,  FORAMINOTOMY, LEFT LUMBAR THREE-FOUR;  Surgeon: Consuella Lose, MD;  Location: [place];  Service: Neurosurgery;  Laterality: Left;   NASAL SEPTUM SURGERY     precutaneous transluminal coronary angioplasty     right hernia     TONSILLECTOMY AND ADENOIDECTOMY      Family History  Problem Relation Age of Onset   Heart disease Father    Heart attack Father    Atrial fibrillation Father    Heart disease Mother    Atrial fibrillation Mother    Stroke Mother    AAA (abdominal aortic aneurysm) Brother     Medications- reviewed and updated Current Outpatient Medications  Medication Sig Dispense Refill   acetaminophen (ACETAMINOPHEN 8 HOUR) 650 MG CR tablet Take 1 tablet (650 mg total) by mouth every 8 (eight) hours as needed for pain. (Patient taking differently: Take 1,300 mg by mouth in the morning.) 270 tablet 3   acetaminophen (TYLENOL) 500 MG tablet Take 500 mg by mouth at bedtime.     apixaban (ELIQUIS) 5 MG TABS tablet Take 1 tablet (5 mg total) by mouth 2 (two) times daily. 60 tablet 11   atenolol (TENORMIN) 25 MG tablet TAKE 1 TABLET (25 MG TOTAL) BY MOUTH DAILY. 90 tablet 1  atorvastatin (LIPITOR) 40 MG tablet TAKE 1/2 TABLET BY MOUTH EVERY DAY (Patient taking differently: Take 20 mg by mouth at bedtime.) 45 tablet 3   Coenzyme Q10 (CO Q 10 PO) Take 1 capsule by mouth in the morning.     Cyanocobalamin (VITAMIN B-12) 1000 MCG SUBL Place 1 tablet (1,000 mcg total) under the tongue daily. 90 tablet 0   furosemide (LASIX) 20 MG tablet Take 1 tablet (20 mg total) by mouth daily. 90 tablet 0   hydroxypropyl methylcellulose / hypromellose (ISOPTO TEARS / GONIOVISC) 2.5 % ophthalmic solution Place 1 drop into both eyes in the morning.     losartan (COZAAR) 50 MG tablet Take 25 mg by mouth daily.     nitroGLYCERIN (NITROSTAT) 0.4 MG SL tablet Dissolve 1 tablet under the tongue every 5 minutes as needed (Patient taking differently: Place 0.4 mg under the tongue every 5 (five) minutes as  needed for chest pain.) 90 tablet 1   oxymetazoline (AFRIN) 0.05 % nasal spray Place 2-4 sprays into both nostrils at bedtime.     tamsulosin (FLOMAX) 0.4 MG CAPS capsule TAKE 1 CAPSULE BY MOUTH EVERY DAY 90 capsule 1   No current facility-administered medications for this visit.    Allergies-reviewed and updated Allergies  Allergen Reactions   Nitrous Oxide Other (See Comments)    Pt reports he "passed out and almost died" after inhaling gas through mask at dentist office    Simvastatin Other (See Comments)    Muscle aches     Social History   Socioeconomic History   Marital status: Married    Spouse name: Not on file   Number of children: 3   Years of education: 16   Highest education level: Not on file  Occupational History   Occupation: [employer]  Tobacco Use   Smoking status: Former    Packs/day: 1.00    Years: 60.00    Total pack years: 60.00    Types: Cigarettes    Quit date: 03/25/1998    Years since quitting: 23.9   Smokeless tobacco: Never  Vaping Use   Vaping Use: Never used  Substance and Sexual Activity   Alcohol use: Not Currently    Comment: once a month   Drug use: No   Sexual activity: Not on file  Other Topics Concern   Not on file  Social History Narrative   Fun: Working outside in yard, cutting wood    Denies abuse and feels safe at home.    Right handed   Lives at home with wife, daughter, granddaughter, and son (periodically)   Social Determinants of Health   Financial Resource Strain: Low Risk  (08/06/2021)   Overall Financial Resource Strain (CARDIA)    Difficulty of Paying Living Expenses: Not hard at all  Food Insecurity: No Food Insecurity (08/06/2021)   Hunger Vital Sign    Worried About Running Out of Food in the Last Year: Never true    Ran Out of Food in the Last Year: Never true  Transportation Needs: No Transportation Needs (08/06/2021)   PRAPARE - Hydrologist (Medical): No    Lack of Transportation  (Non-Medical): No  Physical Activity: Inactive (08/06/2021)   Exercise Vital Sign    Days of Exercise per Week: 0 days    Minutes of Exercise per Session: 0 min  Stress: Stress Concern Present (08/06/2021)   [place]    Feeling of Stress :  To some extent  Social Connections: Moderately Isolated (08/06/2021)   Social Connection and Isolation Panel [NHANES]    Frequency of Communication with Friends and Family: More than three times a week    Frequency of Social Gatherings with Friends and Family: More than three times a week    Attends Religious Services: Never    Marine scientist or Organizations: No    Attends Music therapist: Never    Marital Status: Married        Objective:  Physical Exam: BP 127/75   Pulse 74   Temp (!) 97.5 F (36.4 C) (Temporal)   Ht 6' (1.829 m)   Wt 225 lb 3.2 oz (102.2 kg)   SpO2 95%   BMI 30.54 kg/m   Body mass index is 30.54 kg/m. Wt Readings from Last 3 Encounters:  03/10/22 225 lb 3.2 oz (102.2 kg)  01/04/22 220 lb 9.6 oz (100.1 kg)  12/22/21 220 lb (99.8 kg)   Gen: NAD, resting comfortably HEENT: TMs normal bilaterally. OP clear. No thyromegaly noted.  CV: Irregular with no murmurs appreciated Pulm: NWOB, CTAB with no crackles, wheezes, or rhonchi GI: Normal bowel sounds present. Soft, Nontender, Nondistended. MSK: no edema, cyanosis, or clubbing noted Skin: warm, dry Neuro: CN2-12 grossly intact. Strength 5/5 in upper and lower extremities. Reflexes symmetric and intact bilaterally.  Psych: Normal affect and thought content     Tanelle Lanzo M. [name], MD 03/10/2022 1:23 PM

## 2022-03-10 NOTE — Assessment & Plan Note (Signed)
Blood pressure at goal today on atenolol 25 mg daily and losartan 25 mg daily.

## 2022-03-10 NOTE — Assessment & Plan Note (Signed)
Last A1c 5.9.  Deferred checking labs today.  Will recheck again in 6 to 12 months.  Discussed lifestyle modifications.

## 2022-03-10 NOTE — Assessment & Plan Note (Signed)
Needs repeat US. Will order today.

## 2022-03-10 NOTE — Assessment & Plan Note (Signed)
Rate controlled on atenolol 25 mg daily and anticoagulated on Eliquis.

## 2022-03-10 NOTE — Assessment & Plan Note (Signed)
Last LDL 55.  Continue Lipitor 20 mg daily.  Recheck again in 6 to 12 months.  He is tolerating well.

## 2022-03-10 NOTE — Assessment & Plan Note (Signed)
Follows with cardiology.  On Lipitor 20 mg daily and anticoagulated on Eliquis for paroxysmal atrial fibrillation.

## 2022-03-10 NOTE — Assessment & Plan Note (Signed)
Follows with urology at the [place].  Currently on Flomax.  Self caths twice daily.  Overall symptoms are stable.

## 2022-03-11 ENCOUNTER — Telehealth (HOSPITAL_BASED_OUTPATIENT_CLINIC_OR_DEPARTMENT_OTHER): Payer: Self-pay | Admitting: *Deleted

## 2022-03-11 NOTE — Telephone Encounter (Signed)
Left message for patient to call and discuss scheduling the AAA duplex ordered by Dr. [name]

## 2022-03-29 ENCOUNTER — Ambulatory Visit (HOSPITAL_BASED_OUTPATIENT_CLINIC_OR_DEPARTMENT_OTHER): Payer: Medicare Other | Admitting: Cardiology

## 2022-03-29 ENCOUNTER — Encounter (HOSPITAL_BASED_OUTPATIENT_CLINIC_OR_DEPARTMENT_OTHER): Payer: Self-pay | Admitting: Cardiology

## 2022-03-29 VITALS — BP 132/78 | HR 75 | Ht 72.0 in | Wt 225.7 lb

## 2022-03-29 DIAGNOSIS — I872 Venous insufficiency (chronic) (peripheral): Secondary | ICD-10-CM

## 2022-03-29 DIAGNOSIS — I4821 Permanent atrial fibrillation: Secondary | ICD-10-CM

## 2022-03-29 DIAGNOSIS — Z7901 Long term (current) use of anticoagulants: Secondary | ICD-10-CM

## 2022-03-29 DIAGNOSIS — D6869 Other thrombophilia: Secondary | ICD-10-CM

## 2022-03-29 DIAGNOSIS — I10 Essential (primary) hypertension: Secondary | ICD-10-CM

## 2022-03-29 DIAGNOSIS — Z8673 Personal history of transient ischemic attack (TIA), and cerebral infarction without residual deficits: Secondary | ICD-10-CM

## 2022-03-29 DIAGNOSIS — I2510 Atherosclerotic heart disease of native coronary artery without angina pectoris: Secondary | ICD-10-CM

## 2022-03-29 NOTE — Progress Notes (Signed)
Cardiology Office Note:    Date:  03/29/2022   ID:  William Norman, DOB [birth year], MRN [id number]  PCP:  Vivi Barrack, MD  Cardiologist:  Buford Dresser, MD  Referring MD: Vivi Barrack, MD   CC: new patient to me/establish care  History of Present Illness:    William Norman is a 82 y.o. male with a hx of permanent atrial fibrillation, chronic diastolic heart failure, history of CVA/TIA, CAD, mixed hyperlipidemia, hypertension who is seen as a new patient to me/prior patient of Dr. Bettina Gavia.  Brings a copy of his recent echo from the [place], will upload this to the chart. Has permanent atrial fibrillation, doesn't bother him. On apixaban, no bleeding issues. Has a prior history of stroke/TIA. Last TIA was while he was off apixaban pending an epidural injection.   Very active, able to rake leaves, push a lawnmower without issues. Has some general fatigue but doesn't limit him.   Doesn't check blood pressure at home. Bp was 127/75 at visit with Dr. [name] recently.   Has chronic mild LE edema. Discussed venous insufficiency today.  Denies chest pain, shortness of breath at rest or with normal exertion. No PND, orthopnea, LE edema or unexpected weight gain. No syncope or palpitations.   Past Medical History:  Diagnosis Date   BPH (benign prostatic hypertrophy)    BPH associated with nocturia 08/29/2013   BPH with obstruction/lower urinary tract symptoms 08/14/2019   CAD (coronary artery disease)    CAD (coronary artery disease), native coronary artery    PTCA of RCA 1986, PTCA of LAD 1992,  Negative treadmill Cardiolite 2011    Chronic dermatitis of hands 10/12/2015   Chronic venous insufficiency    Coagulation disorder ([place]) 10/26/2018   DVT (deep venous thrombosis) ([place])    Dyslipidemia    Dysrhythmia    Eczema    Erectile dysfunction 07/11/2012   Essential hypertension    GERD 08/04/2006       GERD (gastroesophageal reflux disease)    Hyperglycemia  07/08/2019   Hyperlipidemia    Hypertension    Incomplete emptying of bladder 08/14/2019   Long term current use of anticoagulant therapy    Lumbar disc disease    Overweight (BMI 25.0-29.9) 06/18/2015   Persistent atrial fibrillation ([place]) 04/10/2018   Personal history of DVT (deep vein thrombosis)    Initially in 1998 with recurrence in 2002 now on chronic warfarin    Soft tissue lesion of foot 08/27/2015   Stroke [facility])    Urinary tract infection with hematuria 08/14/2019    Past Surgical History:  Procedure Laterality Date   APPLICATION OF WOUND VAC  09/02/2020   Procedure: APPLICATION OF WOUND VAC;  Surgeon: Donnie Mesa, MD;  Location: [place];  Service: General;;   CARDIAC CATHETERIZATION     CARPAL TUNNEL RELEASE  04/21/2011   Procedure: CARPAL TUNNEL RELEASE;  Surgeon: Tennis Must, MD;  Location: [place];  Service: Orthopedics;  Laterality: Left;   CARPAL TUNNEL RELEASE  05/23/2011   Procedure: CARPAL TUNNEL RELEASE;  Surgeon: Tennis Must, MD;  Location: [place];  Service: Orthopedics;  Laterality: Right;   COLON SURGERY  2010 and 2011 colostomy reversal   CORONARY ANGIOPLASTY     EYE SURGERY     repair of macular hole   LAPAROTOMY N/A 09/02/2020   Procedure: EXPLORATORY LAPAROTOMY, LYSIS OF ADHESIONS ,BOWEL RESECTION;  Surgeon: Donnie Mesa, MD;  Location: [place];  Service: General;  Laterality: N/A;   LUMBAR LAMINECTOMY/DECOMPRESSION MICRODISCECTOMY Left 03/18/2021   Procedure: LAMINOTOMY, FORAMINOTOMY, LEFT LUMBAR THREE-FOUR;  Surgeon: Consuella Lose, MD;  Location: [place];  Service: Neurosurgery;  Laterality: Left;   NASAL SEPTUM SURGERY     precutaneous transluminal coronary angioplasty     right hernia     TONSILLECTOMY AND ADENOIDECTOMY      Current Medications: Current Outpatient Medications on File Prior to Visit  Medication Sig   acetaminophen (ACETAMINOPHEN 8 HOUR) 650 MG CR tablet Take 1 tablet (650 mg total) by mouth  every 8 (eight) hours as needed for pain. (Patient taking differently: Take 1,300 mg by mouth in the morning.)   acetaminophen (TYLENOL) 500 MG tablet Take 500 mg by mouth at bedtime.   apixaban (ELIQUIS) 5 MG TABS tablet Take 1 tablet (5 mg total) by mouth 2 (two) times daily.   atenolol (TENORMIN) 25 MG tablet TAKE 1 TABLET (25 MG TOTAL) BY MOUTH DAILY.   atorvastatin (LIPITOR) 40 MG tablet TAKE 1/2 TABLET BY MOUTH EVERY DAY (Patient taking differently: Take 20 mg by mouth at bedtime.)   Coenzyme Q10 (CO Q 10 PO) Take 1 capsule by mouth in the morning.   Cyanocobalamin (VITAMIN B-12) 1000 MCG SUBL Place 1 tablet (1,000 mcg total) under the tongue daily.   furosemide (LASIX) 20 MG tablet Take 1 tablet (20 mg total) by mouth daily.   hydroxypropyl methylcellulose / hypromellose (ISOPTO TEARS / GONIOVISC) 2.5 % ophthalmic solution Place 1 drop into both eyes in the morning.   losartan (COZAAR) 50 MG tablet Take 25 mg by mouth daily.   nitroGLYCERIN (NITROSTAT) 0.4 MG SL tablet Dissolve 1 tablet under the tongue every 5 minutes as needed (Patient taking differently: Place 0.4 mg under the tongue every 5 (five) minutes as needed for chest pain.)   oxymetazoline (AFRIN) 0.05 % nasal spray Place 2-4 sprays into both nostrils at bedtime.   tamsulosin (FLOMAX) 0.4 MG CAPS capsule TAKE 1 CAPSULE BY MOUTH EVERY DAY   No current facility-administered medications on file prior to visit.     Allergies:   Nitrous oxide and Simvastatin   Social History   Tobacco Use   Smoking status: Former    Packs/day: 1.00    Years: 60.00    Total pack years: 60.00    Types: Cigarettes    Quit date: 03/25/1998    Years since quitting: 24.0   Smokeless tobacco: Never  Vaping Use   Vaping Use: Never used  Substance Use Topics   Alcohol use: Not Currently    Comment: once a month   Drug use: No    Family History: family history includes AAA (abdominal aortic aneurysm) in his brother; Atrial fibrillation in his  father and mother; Heart attack in his father; Heart disease in his father and mother; Stroke in his mother.  ROS:   Please see the history of present illness.  Additional pertinent ROS otherwise unremarkable.  EKGs/Labs/Other Studies Reviewed:    The following studies were reviewed today: [facility] EF 50-55%, mild cLVH, diastolic function not evaluated 2/2 technical limitations. RV normal Severe LA enlargement Moderate AR, no AS Mild MR Mild TR RVSP 30-35 mmHg  From Dr. Thurman Coyer notes Cardiac Cath Results: normal Left main, 80% stenosis proximal LAD, no significant disease CFX, 40% stenosis proximal RCA, widely patent RCA at the previous angioplasty site, PTCA of LAD done after unsuccessful atherectomy   EKG:  EKG is personally reviewed.   03/29/22: not ordered today  Recent Labs: 08/24/2021: TSH 2.62 12/22/2021: ALT 22; BUN 16; Creatinine, Ser 1.11; Hemoglobin 14.7; Platelets 183; Potassium 3.9; Sodium 141  Recent Lipid Panel    Component Value Date/Time   CHOL 123 08/24/2021 1443   CHOL 130 12/03/2019 1509   TRIG 72.0 08/24/2021 1443   TRIG 68 01/10/2006 1108   HDL 53.20 08/24/2021 1443   HDL 48 12/03/2019 1509   CHOLHDL 2 08/24/2021 1443   VLDL 14.4 08/24/2021 1443   LDLCALC 55 08/24/2021 1443   LDLCALC 66 12/03/2019 1509   LDLDIRECT 131.5 04/22/2010 1018    Physical Exam:    VS:  BP 132/78   Pulse 75   Ht 6' (1.829 m)   Wt 225 lb 11.2 oz (102.4 kg)   SpO2 97%   BMI 30.61 kg/m     Wt Readings from Last 3 Encounters:  03/29/22 225 lb 11.2 oz (102.4 kg)  03/10/22 225 lb 3.2 oz (102.2 kg)  01/04/22 220 lb 9.6 oz (100.1 kg)    GEN: Well nourished, well developed in no acute distress HEENT: Normal, moist mucous membranes NECK: No JVD CARDIAC: regular rhythm, normal S1 and S2, no rubs or gallops. No murmur. VASCULAR: Radial and DP pulses 2+ bilaterally. No carotid bruits RESPIRATORY:  Clear to auscultation without rales, wheezing or rhonchi  ABDOMEN:  Soft, non-tender, non-distended MUSCULOSKELETAL:  Ambulates independently SKIN: Warm and dry, no pitting edema. Mild bilateral nonpitting edema with chronic skin discoloration consistent with venous stasis NEUROLOGIC:  Alert and oriented x 3. No focal neuro deficits noted. PSYCHIATRIC:  Normal affect    ASSESSMENT:    1. Permanent atrial fibrillation ([place])   2. Secondary hypercoagulable state ([place])   3. Long term current use of anticoagulant   4. Essential hypertension   5. Chronic venous insufficiency   6. History of CVA (cerebrovascular accident)   7. Coronary artery disease involving native coronary artery of native heart without angina pectoris    PLAN:    Permanent atrial fibrillation History of CVA -CHA2DS2/VAS Stroke Risk Points= 7. Has had TIA and possible CVA off of anticoagulation, would bridge for procedures -on apixaban, tolerating without bleeding -on atenolol for rate control  Hypertension -improved on recheck -continue losartan, furosemide  Chronic venous insufficiency -on low dose lasix -discussed compression stockings, elevation, activity  CAD with prior PCI -per Dr. Thurman Coyer notes, had cath in 1992 with PTCA of the LAD. Prior PTCA to the RCA in 1986 -continue statin -no aspirin as he is on apixaban  Cardiac risk counseling and prevention recommendations: -recommend heart healthy/Mediterranean diet, with whole grains, fruits, vegetable, fish, lean meats, nuts, and olive oil. Limit salt. -recommend moderate walking, 3-5 times/week for 30-50 minutes each session. Aim for at least 150 minutes.week. Goal should be pace of 3 miles/hours, or walking 1.5 miles in 30 minutes -recommend avoidance of tobacco products. Avoid excess alcohol. -ASCVD risk score: The ASCVD Risk score (Arnett DK, et al., 2019) failed to calculate for the following reasons:   The 2019 ASCVD risk score is only valid for ages 54 to 4   The patient has a prior MI or stroke diagnosis     Plan for follow up: 6 mos or sooner as needed.  Buford Dresser, MD, PhD, [facility] at [facility] at [facility] [address], [place], [place] [address] [phone]   Medication Adjustments/Labs and Tests Ordered: Current medicines are reviewed at length with the patient  today.  Concerns regarding medicines are outlined above.  No orders of the defined types were placed in this encounter.  No orders of the defined types were placed in this encounter.   Patient Instructions  Medication Instructions:  Your Physician recommend you continue on your current medication as directed.    *If you need a refill on your cardiac medications before your next appointment, please call your pharmacy*  Follow-Up: At [facility], you and your health needs are our priority.  As part of our continuing mission to provide you with exceptional heart care, we have created designated Provider Care Teams.  These Care Teams include your primary Cardiologist (physician) and Advanced Practice Providers (APPs -  Physician Assistants and Nurse Practitioners) who all work together to provide you with the care you need, when you need it.  We recommend signing up for the patient portal called "MyChart".  Sign up information is provided on this After Visit Summary.  MyChart is used to connect with patients for Virtual Visits (Telemedicine).  Patients are able to view lab/test results, encounter notes, upcoming appointments, etc.  Non-urgent messages can be sent to your provider as well.   To learn more about what you can do with MyChart, go to [email or web address].    Your next appointment:   6 month(s)  Provider:   Buford Dresser, MD    Signed, Buford Dresser, MD PhD 03/29/2022 6:14 PM    [place]

## 2022-03-29 NOTE — Patient Instructions (Signed)
Medication Instructions:  Your Physician recommend you continue on your current medication as directed.    *If you need a refill on your cardiac medications before your next appointment, please call your pharmacy*  Follow-Up: At [facility], you and your health needs are our priority.  As part of our continuing mission to provide you with exceptional heart care, we have created designated Provider Care Teams.  These Care Teams include your primary Cardiologist (physician) and Advanced Practice Providers (APPs -  Physician Assistants and Nurse Practitioners) who all work together to provide you with the care you need, when you need it.  We recommend signing up for the patient portal called "MyChart".  Sign up information is provided on this After Visit Summary.  MyChart is used to connect with patients for Virtual Visits (Telemedicine).  Patients are able to view lab/test results, encounter notes, upcoming appointments, etc.  Non-urgent messages can be sent to your provider as well.   To learn more about what you can do with MyChart, go to [email or web address].    Your next appointment:   6 month(s)  Provider:   Buford Dresser, MD

## 2022-03-30 ENCOUNTER — Encounter (HOSPITAL_BASED_OUTPATIENT_CLINIC_OR_DEPARTMENT_OTHER): Payer: Self-pay

## 2022-04-01 ENCOUNTER — Ambulatory Visit (INDEPENDENT_AMBULATORY_CARE_PROVIDER_SITE_OTHER): Payer: Medicare Other

## 2022-04-01 DIAGNOSIS — I714 Abdominal aortic aneurysm, without rupture, unspecified: Secondary | ICD-10-CM

## 2022-04-04 ENCOUNTER — Encounter: Payer: Self-pay | Admitting: *Deleted

## 2022-04-04 NOTE — Progress Notes (Signed)
Please inform patient of the following:  His scan showed that he has some dilation in the arteries in his lower pelvis called the iliac arteries. This is not an urgent concern but it is something that needs to be monitored yearly. Recommend referral to vascular surgery.  William Norman. [name], MD 04/04/2022 7:50 AM

## 2022-04-06 ENCOUNTER — Other Ambulatory Visit: Payer: Self-pay

## 2022-04-06 DIAGNOSIS — I714 Abdominal aortic aneurysm, without rupture, unspecified: Secondary | ICD-10-CM

## 2022-04-08 ENCOUNTER — Encounter: Payer: Self-pay | Admitting: Vascular Surgery

## 2022-04-08 ENCOUNTER — Ambulatory Visit: Payer: Medicare Other | Admitting: Vascular Surgery

## 2022-04-08 VITALS — BP 122/71 | HR 92 | Temp 97.9°F | Resp 20 | Ht 72.0 in | Wt 224.8 lb

## 2022-04-08 DIAGNOSIS — I723 Aneurysm of iliac artery: Secondary | ICD-10-CM

## 2022-04-08 NOTE — Progress Notes (Signed)
Office Note     CC: AAA asymptomatic Requesting Provider:  Vivi Barrack, MD  HPI: William Norman is a 82 y.o. (03/07/1940) male presenting at the request of .Vivi Barrack, MD for evaluation of asymptomatic AAA, bilateral common iliac artery ectasia.  On exam today, William Norman was doing well.  A native of [place], he has lived in the [place] area since 1982.  A father of 2, his son unfortunately passed away, and his daughter lives with him.  He has 3 grandchildren, all girls.  William Norman served in [employer] in the 60s, stationed in [place], afterwards, he worked for [employer] in downtown [place] for over 35 years.  William Norman, William weakness at the level of the knee.  He denies symptoms of claudication, ischemic rest pain, tissue loss.  Denies history of new onset acute back pain, belly pain, pelvic pain.   Past Medical History:  Diagnosis Date   BPH (benign prostatic hypertrophy)    BPH associated with nocturia 08/29/2013   BPH with obstruction/lower urinary tract symptoms 08/14/2019   CAD (coronary artery disease)    CAD (coronary artery disease), native coronary artery    PTCA of RCA 1986, PTCA of LAD 1992,  Negative treadmill Cardiolite 2011    Chronic dermatitis of hands 10/12/2015   Chronic venous insufficiency    Coagulation disorder ([place]) 10/26/2018   DVT (deep venous thrombosis) ([place])    Dyslipidemia    Dysrhythmia    Eczema    Erectile dysfunction 07/11/2012   Essential hypertension    GERD 08/04/2006       GERD (gastroesophageal reflux disease)    Hyperglycemia 07/08/2019   Hyperlipidemia    Hypertension    Incomplete emptying of bladder 08/14/2019   Long term current use of anticoagulant therapy    Lumbar disc disease    Overweight (BMI 25.0-29.9) 06/18/2015   Persistent atrial fibrillation ([place]) 04/10/2018   Personal history of DVT (deep vein thrombosis)    Initially in 1998 with  recurrence in 2002 now on chronic warfarin    Soft tissue lesion of foot 08/27/2015   Stroke [facility])    Urinary tract infection with hematuria 08/14/2019    Past Surgical History:  Procedure Laterality Date   APPLICATION OF WOUND VAC  09/02/2020   Procedure: APPLICATION OF WOUND VAC;  Surgeon: Donnie Mesa, MD;  Location: [place];  Service: General;;   CARDIAC CATHETERIZATION     CARPAL TUNNEL RELEASE  04/21/2011   Procedure: CARPAL TUNNEL RELEASE;  Surgeon: Tennis Must, MD;  Location: [place];  Service: Orthopedics;  Laterality: Left;   CARPAL TUNNEL RELEASE  05/23/2011   Procedure: CARPAL TUNNEL RELEASE;  Surgeon: Tennis Must, MD;  Location: [place];  Service: Orthopedics;  Laterality: Right;   COLON SURGERY  2010 and 2011 colostomy reversal   CORONARY ANGIOPLASTY     EYE SURGERY     repair of macular hole   LAPAROTOMY N/A 09/02/2020   Procedure: EXPLORATORY LAPAROTOMY, LYSIS OF ADHESIONS ,BOWEL RESECTION;  Surgeon: Donnie Mesa, MD;  Location: [facility];  Service: General;  Laterality: N/A;   LUMBAR LAMINECTOMY/DECOMPRESSION MICRODISCECTOMY Left 03/18/2021   Procedure: LAMINOTOMY, FORAMINOTOMY, LEFT LUMBAR THREE-FOUR;  Surgeon: Consuella Lose, MD;  Location: [place];  Service: Neurosurgery;  Laterality: Left;   NASAL SEPTUM SURGERY     precutaneous transluminal coronary angioplasty     right hernia  TONSILLECTOMY AND ADENOIDECTOMY      Social History   Socioeconomic History   Marital status: Married    Spouse name: Not on file   Number of children: 3   Years of education: 16   Highest education level: Not on file  Occupational History   Occupation: [employer]  Tobacco Use   Smoking status: Former    Packs/day: 1.00    Years: 60.00    Total pack years: 60.00    Types: Cigarettes    Quit date: 03/25/1998    Years since quitting: 24.0   Smokeless tobacco: Never  Vaping Use   Vaping Use: Never used  Substance and Sexual Activity   Alcohol  use: Not Currently    Comment: once a month   Drug use: No   Sexual activity: Not on file  Other Topics Concern   Not on file  Social History Narrative   Fun: Working outside in yard, cutting wood    Denies abuse and feels safe at home.    Right handed   Lives at home with wife, daughter, granddaughter, and son (periodically)   Social Determinants of Health   Financial Resource Strain: Low Risk  (08/06/2021)   Overall Financial Resource Strain (CARDIA)    Difficulty of Paying Living Expenses: Not hard at all  Food Insecurity: No Food Insecurity (08/06/2021)   Hunger Vital Sign    Worried About Running Out of Food in the Last Year: Never true    Ran Out of Food in the Last Year: Never true  Transportation Needs: No Transportation Needs (08/06/2021)   PRAPARE - Hydrologist (Medical): No    Lack of Transportation (Non-Medical): No  Physical Activity: Inactive (08/06/2021)   Exercise Vital Sign    Days of Exercise per Week: 0 days    Minutes of Exercise per Session: 0 min  Stress: Stress Concern Present (08/06/2021)   [place]    Feeling of Stress : To some extent  Social Connections: Moderately Isolated (08/06/2021)   Social Connection and Isolation Panel [NHANES]    Frequency of Communication with Friends and Family: More than three times a week    Frequency of Social Gatherings with Friends and Family: More than three times a week    Attends Religious Services: Never    Marine scientist or Organizations: No    Attends Archivist Meetings: Never    Marital Status: Married  Human resources officer Violence: Not At Risk (08/06/2021)   Humiliation, Afraid, Rape, and Kick questionnaire    Fear of Current or Ex-Partner: No    Emotionally Abused: No    Physically Abused: No    Sexually Abused: No   Family History  Problem Relation Age of Onset   Heart disease Mother    Atrial  fibrillation Mother    Stroke Mother    Heart disease Father    Heart attack Father    Atrial fibrillation Father    Heart failure Father    COPD Father    Osteoporosis Sister    AAA (abdominal aortic aneurysm) Brother    Diverticulitis Brother     Current Outpatient Medications  Medication Sig Dispense Refill   acetaminophen (ACETAMINOPHEN 8 HOUR) 650 MG CR tablet Take 1 tablet (650 mg total) by mouth every 8 (eight) hours as needed for pain. (Patient taking differently: Take 1,300 mg by mouth in the morning.) 270 tablet 3  acetaminophen (TYLENOL) 500 MG tablet Take 500 mg by mouth at bedtime.     apixaban (ELIQUIS) 5 MG TABS tablet Take 1 tablet (5 mg total) by mouth 2 (two) times daily. 60 tablet 11   atenolol (TENORMIN) 25 MG tablet TAKE 1 TABLET (25 MG TOTAL) BY MOUTH DAILY. 90 tablet 1   atorvastatin (LIPITOR) 40 MG tablet TAKE 1/2 TABLET BY MOUTH EVERY DAY (Patient taking differently: Take 20 mg by mouth at bedtime.) 45 tablet 3   Coenzyme Q10 (CO Q 10 PO) Take 1 capsule by mouth in the morning.     Cyanocobalamin (VITAMIN B-12) 1000 MCG SUBL Place 1 tablet (1,000 mcg total) under the tongue daily. 90 tablet 0   furosemide (LASIX) 20 MG tablet Take 1 tablet (20 mg total) by mouth daily. 90 tablet 0   hydroxypropyl methylcellulose / hypromellose (ISOPTO TEARS / GONIOVISC) 2.5 % ophthalmic solution Place 1 drop into both eyes in the morning.     losartan (COZAAR) 50 MG tablet Take 25 mg by mouth daily.     nitroGLYCERIN (NITROSTAT) 0.4 MG SL tablet Dissolve 1 tablet under the tongue every 5 minutes as needed (Patient taking differently: Place 0.4 mg under the tongue every 5 (five) minutes as needed for chest pain.) 90 tablet 1   oxymetazoline (AFRIN) 0.05 % nasal spray Place 2-4 sprays into both nostrils at bedtime.     tamsulosin (FLOMAX) 0.4 MG CAPS capsule TAKE 1 CAPSULE BY MOUTH EVERY DAY 90 capsule 1   No current facility-administered medications for this visit.    Allergies   Allergen Reactions   Nitrous Oxide Other (See Comments)    Pt reports he "passed out and almost died" after inhaling gas through mask at dentist office    Simvastatin Other (See Comments)    Muscle aches      REVIEW OF SYSTEMS:  '[X]'$  denotes positive finding, '[ ]'$  denotes negative finding Cardiac  Comments:  Chest pain or chest pressure:    Shortness of breath upon exertion:    Short of breath when lying flat:    Irregular heart rhythm:        Vascular    Pain in calf, thigh, or hip brought on by ambulation:    Pain in feet at night that wakes you up from your sleep:     Blood clot in your veins:    Leg swelling:         Pulmonary    Oxygen at home:    Productive cough:     Wheezing:         Neurologic    Sudden weakness in arms or legs:     Sudden numbness in arms or legs:     Sudden onset of difficulty speaking or slurred speech:    Temporary loss of vision in one eye:     Problems with dizziness:         Gastrointestinal    Blood in stool:     Vomited blood:         Genitourinary    Burning when urinating:     Blood in urine:        Psychiatric    Major depression:         Hematologic    Bleeding problems:    Problems with blood clotting too easily:        Skin    Rashes or ulcers:        Constitutional    Fever or  chills:      PHYSICAL EXAMINATION:  Vitals:   04/08/22 1013  BP: 122/71  Pulse: 92  Resp: 20  Temp: 97.9 F (36.6 C)  SpO2: 94%  Weight: 224 lb 12.8 oz (102 kg)  Height: 6' (1.829 m)    General:  WDWN in NAD; vital signs documented above Gait: Not observed HENT: WNL, normocephalic Pulmonary: normal non-labored breathing , without wheezing Cardiac: regular HR Abdomen: soft, NT, no masses Skin: without rashes Vascular Exam/Pulses:  Right Left  Radial 2+ (normal) 2+ (normal)  Ulnar    Femoral    Popliteal    DP nonpalpable  nonpalpable  PT     Extremities: without ischemic changes, without Gangrene , without cellulitis;  without open wounds;  Musculoskeletal: no muscle wasting or atrophy  Neurologic: A&O X 3;  No focal weakness or paresthesias are detected Psychiatric:  The pt has Normal affect.   Non-Invasive Vascular Imaging:   See studies    ASSESSMENT/PLAN: Zeyad Marra is a 82 y.o. male presenting with aortoiliac aneurysmal changes.  His distal aorta measures 2.8 cm, and his iliacs measure 1.8 cm, 1.5 cm respectively.  I had a long conversation with William Norman regarding the above.  From a vascular standpoint, the distal aorta is defined as ectatic, but does not meet criteria be described as an aneurysm.  Ralphs left common iliac artery is aneurysmal, but is still small at 1.8 cm.  CT imaging was reviewed demonstrating that the right common iliac artery measures 1.7 cm in 2022.   Deidre Ala and I discussed the natural history of aneurysmal growth, and the importance of serial imaging to ensure he does not have significant growth.  With the small amount of growth appreciated since 2022, my plan is to follow him with duplex ultrasounds on a yearly basis.  I also plan to get an ABI to follow his peripheral arterial disease.   Broadus John, MD [facility] [phone]

## 2022-04-29 ENCOUNTER — Encounter (HOSPITAL_BASED_OUTPATIENT_CLINIC_OR_DEPARTMENT_OTHER): Payer: Self-pay | Admitting: *Deleted

## 2022-05-02 ENCOUNTER — Ambulatory Visit: Payer: Medicare HMO | Admitting: Family Medicine

## 2022-05-03 ENCOUNTER — Ambulatory Visit: Payer: Medicare Other | Admitting: Podiatry

## 2022-05-03 ENCOUNTER — Encounter: Payer: Self-pay | Admitting: Podiatry

## 2022-05-03 DIAGNOSIS — D689 Coagulation defect, unspecified: Secondary | ICD-10-CM

## 2022-05-03 DIAGNOSIS — M79676 Pain in unspecified toe(s): Secondary | ICD-10-CM | POA: Diagnosis not present

## 2022-05-03 DIAGNOSIS — B351 Tinea unguium: Secondary | ICD-10-CM | POA: Diagnosis not present

## 2022-05-03 NOTE — Progress Notes (Signed)
This patient returns to my office for at risk foot care.  This patient requires this care by a professional since this patient will be at risk due to having venous insufficiency and coagulation defect.  Patient is taking coumadin.  This patient is unable to cut nails himself since the patient cannot reach his nails.These nails are painful walking and wearing shoes.This patient presents for at risk foot care today.  General Appearance  Alert, conversant and in no acute stress.  Vascular  Dorsalis pedis and posterior tibial  pulses are palpable  bilaterally.  Capillary return is within normal limits  bilaterally. Temperature is within normal limits  bilaterally.  Neurologic  Senn-Weinstein monofilament wire test within normal limits  bilaterally. Muscle power within normal limits bilaterally.  Nails Thick disfigured discolored nails with subungual debris  from hallux to fifth toes bilaterally. No evidence of bacterial infection or drainage bilaterally.   Orthopedic  No limitations of motion  feet .  No crepitus or effusions noted.  No bony pathology or digital deformities noted.  Skin  normotropic skin  noted bilaterally.  No signs of infections or ulcers noted.   Asymptomatic porokeratosis sub 5th met left foot.  Onychomycosis  Pain in right toes  Pain in left toes    Consent was obtained for treatment procedures.   Mechanical debridement of nails 1-5  bilaterally performed with a nail nipper.  Filed with dremel without incident.    Return office visit   12  weeks                  Told patient to return for periodic foot care and evaluation due to potential at risk complications.   Lyndon Chapel DPM  

## 2022-05-09 ENCOUNTER — Ambulatory Visit: Payer: Medicare Other | Admitting: Podiatry

## 2022-05-10 ENCOUNTER — Ambulatory Visit (INDEPENDENT_AMBULATORY_CARE_PROVIDER_SITE_OTHER): Payer: Medicare Other | Admitting: Podiatry

## 2022-05-10 DIAGNOSIS — B351 Tinea unguium: Secondary | ICD-10-CM

## 2022-05-10 DIAGNOSIS — M79676 Pain in unspecified toe(s): Secondary | ICD-10-CM

## 2022-05-11 NOTE — Progress Notes (Signed)
   Chief Complaint  Patient presents with   Nail Problem    Routine foot care, nail trim     SUBJECTIVE Patient presents to office today complaining of elongated, thickened nails that cause pain while ambulating in shoes.  Patient is unable to trim their own nails.  Patient states that he was just into the office on 05/03/2022 with another physician and he was unsatisfied with the nail debridement.  Presenting today to have them reevaluated.   Past Medical History:  Diagnosis Date   BPH (benign prostatic hypertrophy)    BPH associated with nocturia 08/29/2013   BPH with obstruction/lower urinary tract symptoms 08/14/2019   CAD (coronary artery disease)    CAD (coronary artery disease), native coronary artery    PTCA of RCA 1986, PTCA of LAD 1992,  Negative treadmill Cardiolite 2011    Chronic dermatitis of hands 10/12/2015   Chronic venous insufficiency    Coagulation disorder ([place]) 10/26/2018   Diverticulitis    DVT (deep venous thrombosis) ([place])    Dyslipidemia    Dysrhythmia    Eczema    Erectile dysfunction 07/11/2012   Essential hypertension    GERD 08/04/2006       GERD (gastroesophageal reflux disease)    Hyperglycemia 07/08/2019   Hyperlipidemia    Hypertension    Incomplete emptying of bladder 08/14/2019   Long term current use of anticoagulant therapy    Lumbar disc disease    Overweight (BMI 25.0-29.9) 06/18/2015   Persistent atrial fibrillation ([place]) 04/10/2018   Personal history of DVT (deep vein thrombosis)    Initially in 1998 with recurrence in 2002 now on chronic warfarin    Soft tissue lesion of foot 08/27/2015   Stroke ([place])    Urinary tract infection with hematuria 08/14/2019    Allergies  Allergen Reactions   Nitrous Oxide Other (See Comments)    Pt reports he "passed out and almost died" after inhaling gas through mask at dentist office    Simvastatin Other (See Comments)    Muscle aches      OBJECTIVE General Patient is awake, alert, and  oriented x 3 and in no acute distress. Derm Skin is dry and supple bilateral. Negative open lesions or macerations. Remaining integument unremarkable. Nails are tender, long, thickened and dystrophic with subungual debris, consistent with onychomycosis, 1-5 bilateral. No signs of infection noted. Vasc  DP and PT pedal pulses palpable bilaterally. Temperature gradient within normal limits.  Neuro Epicritic and protective threshold sensation grossly intact bilaterally.  Musculoskeletal Exam No symptomatic pedal deformities noted bilateral. Muscular strength within normal limits.  ASSESSMENT 1.  Pain due to onychomycosis of toenails both  PLAN OF CARE 1. Patient evaluated today.  2. Instructed to maintain good pedal hygiene and foot care.  3. Mechanical debridement of nails 1-5 bilaterally performed using a nail nipper. Filed with dremel without incident.  4. Return to clinic in 3 mos.    Edrick Kins, DPM [facility]  Dr. Edrick Kins, DPM    [address], [place] [address]                Office [phone]  Fax [phone]

## 2022-05-17 ENCOUNTER — Other Ambulatory Visit: Payer: Self-pay

## 2022-05-17 ENCOUNTER — Telehealth: Payer: Self-pay | Admitting: Family Medicine

## 2022-05-17 MED ORDER — LOSARTAN POTASSIUM 25 MG PO TABS: 25.0000 mg | ORAL_TABLET | Freq: Every day | ORAL | 3 refills | Status: DC

## 2022-05-17 NOTE — Telephone Encounter (Signed)
Ok with me. Please place any necessary orders. 

## 2022-05-17 NOTE — Telephone Encounter (Signed)
Rx sent 

## 2022-05-17 NOTE — Telephone Encounter (Signed)
Last OV: 03/10/22 Next OV: 09/06/22 Medication: Losartan 50 mg  [facility] [address]   Patient would like to know if he could have losartan 25 mg sent in instead of 50 mg since he is having trouble breaking the pills in half.

## 2022-06-06 ENCOUNTER — Other Ambulatory Visit: Payer: Self-pay | Admitting: *Deleted

## 2022-06-06 ENCOUNTER — Other Ambulatory Visit: Payer: Self-pay | Admitting: Family Medicine

## 2022-06-06 MED ORDER — ATENOLOL 25 MG PO TABS: 25.0000 mg | ORAL_TABLET | Freq: Every day | ORAL | 1 refills | Status: DC

## 2022-06-06 NOTE — Telephone Encounter (Signed)
This should be coming from his cardiologist.  William Norman. William Alder, MD 06/06/2022 1:11 PM

## 2022-06-06 NOTE — Telephone Encounter (Signed)
  Prescription Request  06/06/2022  LOV: 03/10/2022  What is the name of the medication or equipment? furosemide (LASIX) 20 MG tablet ( PER PT NO LONGER SEES PROVIDER WHO PRESCRIBED THIS ) AND atenolol (TENORMIN) 25 MG tablet   Have you contacted your pharmacy to request a refill? Yes   Which pharmacy would you like this sent to?  [id number], [place] - [address] [place] [address] Phone: [phone] Fax: [phone]    Patient notified that their request is being sent to the clinical staff for review and that they should receive a response within 2 business days.   Please advise at Mobile [phone] (mobile)

## 2022-06-07 ENCOUNTER — Other Ambulatory Visit (HOSPITAL_BASED_OUTPATIENT_CLINIC_OR_DEPARTMENT_OTHER): Payer: Self-pay

## 2022-06-07 DIAGNOSIS — I10 Essential (primary) hypertension: Secondary | ICD-10-CM

## 2022-06-07 MED ORDER — FUROSEMIDE 20 MG PO TABS: 20.0000 mg | ORAL_TABLET | Freq: Every day | ORAL | 3 refills | Status: DC

## 2022-06-07 MED ORDER — ATENOLOL 25 MG PO TABS: 25.0000 mg | ORAL_TABLET | Freq: Every day | ORAL | 3 refills | Status: DC

## 2022-06-07 NOTE — Telephone Encounter (Signed)
Patient walked into the clinic yesterday after hours, a note was collected by another front desk member. Patient reports needing refills of his lasix  and atenolol . Per patient he reached out to his pharmacy and was told that it was said that the refill was refused. Unclear why refill was refused as patient is not overdue for follow up and has a correct follow up scheduled. Refills provided to the pharmacy requested. Follow up message sent to patient via mychart.

## 2022-08-15 ENCOUNTER — Ambulatory Visit (INDEPENDENT_AMBULATORY_CARE_PROVIDER_SITE_OTHER): Payer: Medicare Other

## 2022-08-15 VITALS — BP 130/80 | HR 70 | Temp 98.1°F | Wt 224.2 lb

## 2022-08-15 DIAGNOSIS — Z0000 Encounter for general adult medical examination without abnormal findings: Secondary | ICD-10-CM

## 2022-08-15 NOTE — Patient Instructions (Signed)
Mr. William Norman , Thank you for taking time to come for your Medicare Wellness Visit. I appreciate your ongoing commitment to your health goals. Please review the following plan we discussed and let me know if I can assist you in the future.   These are the goals we discussed:  Goals       patient (pt-stated)      Wants to continue to stay active to enjoy life       Patient Stated      None at this time       Patient Stated      None at this time       Track and Manage My Blood Pressure-Hypertension      Timeframe:  Long-Range Goal Priority:  High Start Date:    12/22/20                         Expected End Date:  06/21/21                     Follow Up Date 03/24/21    - check blood pressure weekly - choose a place to take my blood pressure (home, clinic or office, retail store) - write blood pressure results in a log or diary    Why is this important?   You won't feel high blood pressure, but it can still hurt your blood vessels.  High blood pressure can cause heart or kidney problems. It can also cause a stroke.  Making lifestyle changes like losing a little weight or eating less salt will help.  Checking your blood pressure at home and at different times of the day can help to control blood pressure.  If the doctor prescribes medicine remember to take it the way the doctor ordered.  Call the office if you cannot afford the medicine or if there are questions about it.     Notes:         This is a list of the screening recommended for you and due dates:  Health Maintenance  Topic Date Due   COVID-19 Vaccine (9 - 2023-24 season) 03/04/2022   Flu Shot  09/15/2022   Medicare Annual Wellness Visit  08/15/2023   DTaP/Tdap/Td vaccine (5 - Td or Tdap) 10/20/2024   Pneumonia Vaccine  Completed   Zoster (Shingles) Vaccine  Completed   HPV Vaccine  Aged Out    Advanced directives: Advance directive discussed with you today. Even though you declined this today please call our  office should you change your mind and we can give you the proper paperwork for you to fill out.  Conditions/risks identified: none at this time   Next appointment: Follow up in one year for your annual wellness visit.   Preventive Care 74 Years and Older, Male  Preventive care refers to lifestyle choices and visits with your health care provider that can promote health and wellness. What does preventive care include? A yearly physical exam. This is also called an annual well check. Dental exams once or twice a year. Routine eye exams. Ask your health care provider how often you should have your eyes checked. Personal lifestyle choices, including: Daily care of your teeth and gums. Regular physical activity. Eating a healthy diet. Avoiding tobacco and drug use. Limiting alcohol use. Practicing safe sex. Taking low doses of aspirin every day. Taking vitamin and mineral supplements as recommended by your health care provider. What happens during an annual  well check? The services and screenings done by your health care provider during your annual well check will depend on your age, overall health, lifestyle risk factors, and family history of disease. Counseling  Your health care provider may ask you questions about your: Alcohol use. Tobacco use. Drug use. Emotional well-being. Home and relationship well-being. Sexual activity. Eating habits. History of falls. Memory and ability to understand (cognition). Work and work Astronomer. Screening  You may have the following tests or measurements: Height, weight, and BMI. Blood pressure. Lipid and cholesterol levels. These may be checked every 5 years, or more frequently if you are over 27 years old. Skin check. Lung cancer screening. You may have this screening every year starting at age 20 if you have a 30-pack-year history of smoking and currently smoke or have quit within the past 15 years. Fecal occult blood test (FOBT) of  the stool. You may have this test every year starting at age 74. Flexible sigmoidoscopy or colonoscopy. You may have a sigmoidoscopy every 5 years or a colonoscopy every 10 years starting at age 27. Prostate cancer screening. Recommendations will vary depending on your family history and other risks. Hepatitis C blood test. Hepatitis B blood test. Sexually transmitted disease (STD) testing. Diabetes screening. This is done by checking your blood sugar (glucose) after you have not eaten for a while (fasting). You may have this done every 1-3 years. Abdominal aortic aneurysm (AAA) screening. You may need this if you are a current or former smoker. Osteoporosis. You may be screened starting at age 21 if you are at high risk. Talk with your health care provider about your test results, treatment options, and if necessary, the need for more tests. Vaccines  Your health care provider may recommend certain vaccines, such as: Influenza vaccine. This is recommended every year. Tetanus, diphtheria, and acellular pertussis (Tdap, Td) vaccine. You may need a Td booster every 10 years. Zoster vaccine. You may need this after age 35. Pneumococcal 13-valent conjugate (PCV13) vaccine. One dose is recommended after age 41. Pneumococcal polysaccharide (PPSV23) vaccine. One dose is recommended after age 42. Talk to your health care provider about which screenings and vaccines you need and how often you need them. This information is not intended to replace advice given to you by your health care provider. Make sure you discuss any questions you have with your health care provider. Document Released: 02/27/2015 Document Revised: 10/21/2015 Document Reviewed: 12/02/2014 Elsevier Interactive Patient Education  2017 [employer].  Fall Prevention in the Home Falls can cause injuries. They can happen to people of all ages. There are many things you can do to make your home safe and to help prevent falls. What can I  do on the outside of my home? Regularly fix the edges of walkways and driveways and fix any cracks. Remove anything that might make you trip as you walk through a door, such as a raised step or threshold. Trim any bushes or trees on the path to your home. Use bright outdoor lighting. Clear any walking paths of anything that might make someone trip, such as rocks or tools. Regularly check to see if handrails are loose or broken. Make sure that both sides of any steps have handrails. Any raised decks and porches should have guardrails on the edges. Have any leaves, snow, or ice cleared regularly. Use sand or salt on walking paths during winter. Clean up any spills in your garage right away. This includes oil or grease spills. What  can I do in the bathroom? Use night lights. Install grab bars by the toilet and in the tub and shower. Do not use towel bars as grab bars. Use non-skid mats or decals in the tub or shower. If you need to sit down in the shower, use a plastic, non-slip stool. Keep the floor dry. Clean up any water that spills on the floor as soon as it happens. Remove soap buildup in the tub or shower regularly. Attach bath mats securely with double-sided non-slip rug tape. Do not have throw rugs and other things on the floor that can make you trip. What can I do in the bedroom? Use night lights. Make sure that you have a light by your bed that is easy to reach. Do not use any sheets or blankets that are too big for your bed. They should not hang down onto the floor. Have a firm chair that has side arms. You can use this for support while you get dressed. Do not have throw rugs and other things on the floor that can make you trip. What can I do in the kitchen? Clean up any spills right away. Avoid walking on wet floors. Keep items that you use a lot in easy-to-reach places. If you need to reach something above you, use a strong step stool that has a grab bar. Keep electrical  cords out of the way. Do not use floor polish or wax that makes floors slippery. If you must use wax, use non-skid floor wax. Do not have throw rugs and other things on the floor that can make you trip. What can I do with my stairs? Do not leave any items on the stairs. Make sure that there are handrails on both sides of the stairs and use them. Fix handrails that are broken or loose. Make sure that handrails are as long as the stairways. Check any carpeting to make sure that it is firmly attached to the stairs. Fix any carpet that is loose or worn. Avoid having throw rugs at the top or bottom of the stairs. If you do have throw rugs, attach them to the floor with carpet tape. Make sure that you have a light switch at the top of the stairs and the bottom of the stairs. If you do not have them, ask someone to add them for you. What else can I do to help prevent falls? Wear shoes that: Do not have high heels. Have rubber bottoms. Are comfortable and fit you well. Are closed at the toe. Do not wear sandals. If you use a stepladder: Make sure that it is fully opened. Do not climb a closed stepladder. Make sure that both sides of the stepladder are locked into place. Ask someone to hold it for you, if possible. Clearly mark and make sure that you can see: Any grab bars or handrails. First and last steps. Where the edge of each step is. Use tools that help you move around (mobility aids) if they are needed. These include: Canes. Walkers. Scooters. Crutches. Turn on the lights when you go into a dark area. Replace any light bulbs as soon as they burn out. Set up your furniture so you have a clear path. Avoid moving your furniture around. If any of your floors are uneven, fix them. If there are any pets around you, be aware of where they are. Review your medicines with your doctor. Some medicines can make you feel dizzy. This can increase your chance of falling.  Ask your doctor what other  things that you can do to help prevent falls. This information is not intended to replace advice given to you by your health care provider. Make sure you discuss any questions you have with your health care provider. Document Released: 11/27/2008 Document Revised: 07/09/2015 Document Reviewed: 03/07/2014 Elsevier Interactive Patient Education  2017 [employer].

## 2022-08-15 NOTE — Progress Notes (Signed)
Subjective:   William Norman is a 82 y.o. male who presents for Medicare Annual/Subsequent preventive examination.  Visit Complete: Virtual  I connected with  Charlett Lango on 08/15/22 by a audio enabled telemedicine application and verified that I am speaking with the correct person using two identifiers.  Patient Location: Home  Provider Location: Office/Clinic  I discussed the limitations of evaluation and management by telemedicine. The patient expressed understanding and agreed to proceed.  Patient Medicare AWV questionnaire was completed by the patient on 08/08/22; I have confirmed that all information answered by patient is correct and no changes since this date.  Review of Systems     Cardiac Risk Factors include: advanced age (>79men, >27 women);dyslipidemia;hypertension;male gender;obesity (BMI >30kg/m2)     Objective:    Today's Vitals   08/15/22 1315  BP: 130/80  Pulse: 70  Temp: 98.1 F (36.7 C)  SpO2: 93%  Weight: 224 lb 3.2 oz (101.7 kg)   Body mass index is 30.41 kg/m.     08/15/2022    1:23 PM 12/22/2021    5:57 PM 08/06/2021    3:06 PM 06/29/2021    9:32 PM 03/15/2021    2:10 PM 10/16/2020    5:44 PM 08/28/2020    7:24 PM  Advanced Directives  Does Patient Have a Medical Advance Directive? No No No No No No No  Would patient like information on creating a medical advance directive? No - Patient declined  No - Patient declined No - Patient declined No - Patient declined No - Patient declined No - Patient declined    Current Medications (verified) Outpatient Encounter Medications as of 08/15/2022  Medication Sig   acetaminophen (TYLENOL) 500 MG tablet Take 500 mg by mouth at bedtime.   apixaban (ELIQUIS) 5 MG TABS tablet Take 1 tablet (5 mg total) by mouth 2 (two) times daily.   atenolol (TENORMIN) 25 MG tablet Take 1 tablet (25 mg total) by mouth daily.   atorvastatin (LIPITOR) 40 MG tablet TAKE 1/2 TABLET BY MOUTH EVERY DAY (Patient taking  differently: Take 20 mg by mouth at bedtime.)   Coenzyme Q10 (CO Q 10 PO) Take 1 capsule by mouth in the morning.   Cyanocobalamin (VITAMIN B-12) 1000 MCG SUBL Place 1 tablet (1,000 mcg total) under the tongue daily.   furosemide (LASIX) 20 MG tablet Take 1 tablet (20 mg total) by mouth daily.   hydroxypropyl methylcellulose / hypromellose (ISOPTO TEARS / GONIOVISC) 2.5 % ophthalmic solution Place 1 drop into both eyes in the morning.   losartan (COZAAR) 25 MG tablet Take 1 tablet (25 mg total) by mouth daily.   nitroGLYCERIN (NITROSTAT) 0.4 MG SL tablet Dissolve 1 tablet under the tongue every 5 minutes as needed (Patient taking differently: Place 0.4 mg under the tongue every 5 (five) minutes as needed for chest pain.)   oxymetazoline (AFRIN) 0.05 % nasal spray Place 2-4 sprays into both nostrils at bedtime.   tamsulosin (FLOMAX) 0.4 MG CAPS capsule TAKE 1 CAPSULE BY MOUTH EVERY DAY   [DISCONTINUED] acetaminophen (ACETAMINOPHEN 8 HOUR) 650 MG CR tablet Take 1 tablet (650 mg total) by mouth every 8 (eight) hours as needed for pain. (Patient taking differently: Take 1,300 mg by mouth in the morning.)   No facility-administered encounter medications on file as of 08/15/2022.    Allergies (verified) Nitrous oxide and Simvastatin   History: Past Medical History:  Diagnosis Date   BPH (benign prostatic hypertrophy)    BPH associated with nocturia  08/29/2013   BPH with obstruction/lower urinary tract symptoms 08/14/2019   CAD (coronary artery disease)    CAD (coronary artery disease), native coronary artery    PTCA of RCA 1986, PTCA of LAD 1992,  Negative treadmill Cardiolite 2011    Chronic dermatitis of hands 10/12/2015   Chronic venous insufficiency    Coagulation disorder (HCC) 10/26/2018   Diverticulitis    DVT (deep venous thrombosis) (HCC)    Dyslipidemia    Dysrhythmia    Eczema    Erectile dysfunction 07/11/2012   Essential hypertension    GERD 08/04/2006       GERD  (gastroesophageal reflux disease)    Hyperglycemia 07/08/2019   Hyperlipidemia    Hypertension    Incomplete emptying of bladder 08/14/2019   Long term current use of anticoagulant therapy    Lumbar disc disease    Overweight (BMI 25.0-29.9) 06/18/2015   Persistent atrial fibrillation (HCC) 04/10/2018   Personal history of DVT (deep vein thrombosis)    Initially in 1998 with recurrence in 2002 now on chronic warfarin    Soft tissue lesion of foot 08/27/2015   Stroke [facility])    Urinary tract infection with hematuria 08/14/2019   Past Surgical History:  Procedure Laterality Date   APPLICATION OF WOUND VAC  09/02/2020   Procedure: APPLICATION OF WOUND VAC;  Surgeon: Manus Rudd, MD;  Location: [facility] OR;  Service: General;;   CARDIAC CATHETERIZATION     CARPAL TUNNEL RELEASE  04/21/2011   Procedure: CARPAL TUNNEL RELEASE;  Surgeon: Tami Ribas, MD;  Location: [facility];  Service: Orthopedics;  Laterality: Left;   CARPAL TUNNEL RELEASE  05/23/2011   Procedure: CARPAL TUNNEL RELEASE;  Surgeon: Tami Ribas, MD;  Location: [facility];  Service: Orthopedics;  Laterality: Right;   COLON SURGERY  2010 and 2011 colostomy reversal   CORONARY ANGIOPLASTY     EYE SURGERY     repair of macular hole   LAPAROTOMY N/A 09/02/2020   Procedure: EXPLORATORY LAPAROTOMY, LYSIS OF ADHESIONS ,BOWEL RESECTION;  Surgeon: Manus Rudd, MD;  Location[facility];  Service: General;  Laterality: N/A;   LUMBAR LAMINECTOMY/DECOMPRESSION MICRODISCECTOMY Left 03/18/2021   Procedure: LAMINOTOMY, FORAMINOTOMY, LEFT LUMBAR THREE-FOUR;  Surgeon: Lisbeth Renshaw, MD;  Location: [facility];  Service: Neurosurgery;  Laterality: Left;   NASAL SEPTUM SURGERY     precutaneous transluminal coronary angioplasty     right hernia     TONSILLECTOMY AND ADENOIDECTOMY     Family History  Problem Relation Age of Onset   Heart disease Mother    Atrial fibrillation Mother    Stroke Mother    Heart failure  Mother    Colitis Mother    Mitral valve prolapse Mother    Heart disease Father    Heart attack Father    Atrial fibrillation Father    Heart failure Father    COPD Father    Varicose Veins Father    Osteoporosis Sister    Irritable bowel syndrome Sister    AAA (abdominal aortic aneurysm) Brother    Diverticulitis Brother    Social History   Socioeconomic History   Marital status: Married    Spouse name: Not on file   Number of children: 3   Years of education: 16   Highest education level: Not on file  Occupational History   Occupation: [employer]  Tobacco Use   Smoking status: Former    Packs/day: 1.00    Years: 60.00  Additional pack years: 0.00    Total pack years: 60.00    Types: Cigarettes    Quit date: 03/25/1998    Years since quitting: 24.4   Smokeless tobacco: Never  Vaping Use   Vaping Use: Never used  Substance and Sexual Activity   Alcohol use: Not Currently    Comment: once a month   Drug use: No   Sexual activity: Not on file  Other Topics Concern   Not on file  Social History Narrative   Fun: Working outside in yard, cutting wood    Denies abuse and feels safe at home.    Right handed   Lives at home with wife, daughter, granddaughter, and son (periodically)   Social Determinants of Health   Financial Resource Strain: Low Risk  (08/08/2022)   Overall Financial Resource Strain (CARDIA)    Difficulty of Paying Living Expenses: Not hard at all  Food Insecurity: No Food Insecurity (08/08/2022)   Hunger Vital Sign    Worried About Running Out of Food in the Last Year: Never true    Ran Out of Food in the Last Year: Never true  Transportation Needs: No Transportation Needs (08/08/2022)   PRAPARE - Administrator, Civil Service (Medical): No    Lack of Transportation (Non-Medical): No  Physical Activity: Inactive (08/08/2022)   Exercise Vital Sign    Days of Exercise per Week: 0 days    Minutes of Exercise per Session: 10 min  Stress:  Stress Concern Present (08/08/2022)   [employer] of Occupational Health - Occupational Stress Questionnaire    Feeling of Stress : To some extent  Social Connections: Moderately Isolated (08/08/2022)   Social Connection and Isolation Panel [NHANES]    Frequency of Communication with Friends and Family: Once a week    Frequency of Social Gatherings with Friends and Family: More than three times a week    Attends Religious Services: Never    Database administrator or Organizations: No    Attends Engineer, structural: Never    Marital Status: Married    Tobacco Counseling Counseling given: Not Answered   Clinical Intake:  Pre-visit preparation completed: Yes  Pain : No/denies pain     BMI - recorded: 30.41 Nutritional Status: BMI > 30  Obese Nutritional Risks: None Diabetes: No  How often do you need to have someone help you when you read instructions, pamphlets, or other written materials from your doctor or pharmacy?: 1 - Never  Interpreter Needed?: No  Information entered by :: Lanier Ensign, LPN   Activities of Daily Living    08/08/2022    1:48 PM  In your present state of health, do you have any difficulty performing the following activities:  Hearing? 0  Vision? 0  Difficulty concentrating or making decisions? 1  Walking or climbing stairs? 1  Comment use the rails  Dressing or bathing? 1  Doing errands, shopping? 1  Preparing Food and eating ? Y  Comment wife prepares  Using the Toilet? N  In the past six months, have you accidently leaked urine? Y  Comment at times  Do you have problems with loss of bowel control? N  Managing your Medications? N  Managing your Finances? N  Housekeeping or managing your Housekeeping? N    Patient Care Team: [name], MD as PCP - General (Family Medicine) Jodelle Red, MD as PCP - Cardiology (Cardiology) Othella Boyer, MD as Consulting Physician (Cardiology) Jethro Bolus,  MD as  Consulting Physician (Ophthalmology) Dulce Sellar Iline Oven, MD as Consulting Physician (Cardiology) Felecia Shelling, DPM as Consulting Physician (Podiatry) Sedalia Muta, PT as Physical Therapist (Physical Therapy) Erroll Luna, [facility] (Inactive) as Pharmacist (Pharmacist) Lisbeth Renshaw, MD as Consulting Physician (Neurosurgery)  Indicate any recent Medical Services you may have received from other than [facility] providers in the past year (date may be approximate).     Assessment:   This is a routine wellness examination for [place].  Hearing/Vision screen Hearing Screening - Comments:: Pt denies any hearing issues  [facility]:: [facility]   Dietary issues and exercise activities discussed: Current Exercise Habits: The patient does not participate in regular exercise at present   Goals Addressed             This Visit's Progress    Patient Stated       None at this time        Depression Screen    08/15/2022    1:21 PM 03/10/2022   12:56 PM 01/04/2022   11:28 AM 10/04/2021    1:15 PM 08/24/2021    2:43 PM 08/06/2021    3:02 PM 10/02/2020    4:29 PM  PHQ 2/9 Scores  PHQ - 2 Score 1 0 2 0 2 2 3   PHQ- 9 Score 2  8 0 8 4 13     Fall Risk    08/08/2022    1:48 PM 03/10/2022   12:56 PM 10/04/2021    1:15 PM 08/24/2021    2:04 PM 08/06/2021    3:07 PM  Fall Risk   Falls in the past year? 0 0 0 0 1  Number falls in past yr: 0 0 0 0 1  Injury with Fall? 0 0 0 0 1  Risk for fall due to : Impaired vision;Impaired balance/gait;Impaired mobility No Fall Risks No Fall Risks No Fall Risks Impaired vision;Impaired mobility;Impaired balance/gait  Follow up Falls prevention discussed    Falls prevention discussed    MEDICARE RISK AT HOME:   TIMED UP AND GO:  Was the test performed?  Yes  Length of time to ambulate 10 feet: 10 sec Gait slow and steady without use of assistive device    Cognitive Function:    05/03/2021   10:24 AM  MMSE - Mini  Mental State Exam  Orientation to time 5  Orientation to Place 5  Registration 3  Attention/ Calculation 5  Recall 3  Language- name 2 objects 2  Language- repeat 1  Language- follow 3 step command 3  Language- read & follow direction 1  Write a sentence 1  Copy design 1  Total score 30        08/15/2022    1:26 PM 08/06/2021    3:08 PM 07/13/2017    2:36 PM  6CIT Screen  What Year? 0 points 0 points 0 points  What month? 0 points 0 points 0 points  What time? 0 points 0 points 0 points  Count back from 20 0 points 0 points 0 points  Months in reverse 0 points 0 points 0 points  Repeat phrase 4 points 0 points 0 points  Total Score 4 points 0 points 0 points    Immunizations Immunization History  Administered Date(s) Administered   Covid-19, Mrna,Vaccine(Spikevax)76yrs and older 11/17/2021, 01/07/2022   Fluad Quad(high Dose 65+) 10/15/2018, 09/29/2021   Influenza Split 02/14/2005, 11/20/2007, 11/14/2009, 11/16/2012, 10/10/2013, 10/21/2014, 10/12/2015, 11/23/2017   Influenza  Whole 02/14/2005, 11/20/2007, 11/14/2009   Influenza, High Dose Seasonal PF 10/21/2014, 10/12/2015, 11/23/2017, 10/15/2018, 11/06/2019   Influenza,inj,Quad PF,6+ Mos 10/10/2013   Influenza-Unspecified 02/21/2008, 11/16/2012, 10/10/2013, 10/21/2014, 10/12/2015, 11/23/2017, 11/16/2020, 10/15/2021   Novel Infuenza-h1n1-09 02/21/2008   PFIZER Comirnaty(Gray Top)Covid-19 Tri-Sucrose Vaccine 05/20/2020   PFIZER(Purple Top)SARS-COV-2 Vaccination 03/01/2019, 03/21/2019, 11/11/2019, 05/20/2020   PNEUMOCOCCAL CONJUGATE-20 03/20/2021   Pfizer Covid-19 Vaccine Bivalent Booster 21yrs & up 11/23/2020, 08/08/2021   Pneumococcal Conjugate-13 10/21/2014   Pneumococcal Polysaccharide-23 02/15/2003, 02/18/2008   Respiratory Syncytial Virus Vaccine,Recomb Aduvanted(Arexvy) 04/23/2022   Td 02/15/2004, 10/21/2014   Tdap 02/15/2004, 10/21/2014   Unspecified SARS-COV-2 Vaccination 11/17/2021   Zoster Recombinant(Shingrix)  04/15/2019, 06/17/2019   Zoster, Live 03/09/2010   Zoster, Unspecified 04/15/2019, 06/17/2019    TDAP status: Up to date  Flu Vaccine status: Up to date  Pneumococcal vaccine status: Up to date  Covid-19 vaccine status: Completed vaccines  Qualifies for Shingles Vaccine? Yes   Zostavax completed Yes   Shingrix Completed?: Yes  Screening Tests Health Maintenance  Topic Date Due   COVID-19 Vaccine (9 - 2023-24 season) 03/04/2022   INFLUENZA VACCINE  09/15/2022   Medicare Annual Wellness (AWV)  08/15/2023   DTaP/Tdap/Td (5 - Td or Tdap) 10/20/2024   Pneumonia Vaccine 54+ Years old  Completed   Zoster Vaccines- Shingrix  Completed   HPV VACCINES  Aged Out    Health Maintenance  Health Maintenance Due  Topic Date Due   COVID-19 Vaccine (9 - 2023-24 season) 03/04/2022    Colorectal cancer screening: No longer required.   Additional Screening:   Vision Screening: Recommended annual ophthalmology exams for early detection of glaucoma and other disorders of the eye. Is the patient up to date with their annual eye exam?  Yes  Who is the provider or what is the name of the office in which the patient attends annual eye exams? Dr Barton Dubois If pt is not established with a provider, would they like to be referred to a provider to establish care? No .   Dental Screening: Recommended annual dental exams for proper oral hygiene  Community Resource Referral / Chronic Care Management: CRR required this visit?  No   CCM required this visit?  No     Plan:     I have personally reviewed and noted the following in the patient's chart:   Medical and social history Use of alcohol, tobacco or illicit drugs  Current medications and supplements including opioid prescriptions. Patient is not currently taking opioid prescriptions. Functional ability and status Nutritional status Physical activity Advanced directives List of other physicians Hospitalizations, surgeries, and ER  visits in previous 12 months Vitals Screenings to include cognitive, depression, and falls Referrals and appointments  In addition, I have reviewed and discussed with patient certain preventive protocols, quality metrics, and best practice recommendations. A written personalized care plan for preventive services as well as general preventive health recommendations were provided to patient.     Marzella Schlein, LPN   02/19/1094   After Visit Summary: (MyChart) Due to this being a telephonic visit, the after visit summary with patients personalized plan was offered to patient via MyChart   Nurse Notes: none

## 2022-08-24 ENCOUNTER — Ambulatory Visit (INDEPENDENT_AMBULATORY_CARE_PROVIDER_SITE_OTHER): Payer: Medicare Other | Admitting: Family

## 2022-08-24 VITALS — BP 131/86 | HR 68 | Temp 94.6°F | Ht 72.0 in | Wt 224.6 lb

## 2022-08-24 DIAGNOSIS — J0110 Acute frontal sinusitis, unspecified: Secondary | ICD-10-CM

## 2022-08-24 DIAGNOSIS — J3089 Other allergic rhinitis: Secondary | ICD-10-CM | POA: Diagnosis not present

## 2022-08-24 MED ORDER — TRIAMCINOLONE ACETONIDE 55 MCG/ACT NA AERO: 1.0000 | INHALATION_SPRAY | Freq: Every day | NASAL | 2 refills | Status: AC

## 2022-08-24 MED ORDER — AMOXICILLIN-POT CLAVULANATE 875-125 MG PO TABS: 1.0000 | ORAL_TABLET | Freq: Two times a day (BID) | ORAL | 0 refills | Status: DC

## 2022-08-24 NOTE — Progress Notes (Signed)
Patient ID: William Norman, male    DOB: [birth year], 82 y.o.   MRN: [id number]  Chief Complaint  Patient presents with   Cough    sx for 3w    HPI:      Sinus/allergy sx:  Pt c/o dry cough, clearing throat often, runny nose with mucus that is now green for 3 weeks, occasional blood in nose in for last 2 days when he blows his nose. Has tried afrin at night. Denies fever, sinus pain, sore throat or body aches. Denies having allergy sx in past.  Reports sleeping ok but when he wakes up in am he starts coughing. Also reports hearing wheezing, rattling sound when he first lies down at night.   Assessment & Plan:  1. Acute non-recurrent frontal sinusitis sending augmentin, last kidney fx good, advised on use & SE, also sending steroid nasal spray, advised on use and to stop the Afrin. If sx are not improved after another week, advised to call back.   - triamcinolone (NASACORT) 55 MCG/ACT AERO nasal inhaler; Place 1 spray into the nose daily. Start with 1 spray each side twice a day for 3 days, then reduce to daily until sinus symptoms are better.  Dispense: 1 each; Refill: 2 - amoxicillin-clavulanate (AUGMENTIN) 875-125 MG tablet; Take 1 tablet by mouth 2 (two) times daily after a meal.  Dispense: 10 tablet; Refill: 0  2. Non-seasonal allergic rhinitis, unspecified trigger sending steroid nasal spray, if sx are still not controlled after another week, advised pt to call back, may need to add anticholinergic or antihistamine nasal spray.  - triamcinolone (NASACORT) 55 MCG/ACT AERO nasal inhaler; Place 1 spray into the nose daily. Start with 1 spray each side twice a day for 3 days, then reduce to daily until sinus symptoms are better.  Dispense: 1 each; Refill: 2  Subjective:    Outpatient Medications Prior to Visit  Medication Sig Dispense Refill   acetaminophen (TYLENOL) 500 MG tablet Take 500 mg by mouth at bedtime.     apixaban (ELIQUIS) 5 MG TABS tablet Take 1 tablet (5 mg total)  by mouth 2 (two) times daily. 60 tablet 11   atenolol (TENORMIN) 25 MG tablet Take 1 tablet (25 mg total) by mouth daily. 90 tablet 3   atorvastatin (LIPITOR) 40 MG tablet TAKE 1/2 TABLET BY MOUTH EVERY DAY (Patient taking differently: Take 20 mg by mouth at bedtime.) 45 tablet 3   Coenzyme Q10 (CO Q 10 PO) Take 1 capsule by mouth in the morning.     Cyanocobalamin (VITAMIN B-12) 1000 MCG SUBL Place 1 tablet (1,000 mcg total) under the tongue daily. 90 tablet 0   furosemide (LASIX) 20 MG tablet Take 1 tablet (20 mg total) by mouth daily. 90 tablet 3   hydroxypropyl methylcellulose / hypromellose (ISOPTO TEARS / GONIOVISC) 2.5 % ophthalmic solution Place 1 drop into both eyes in the morning.     losartan (COZAAR) 25 MG tablet Take 1 tablet (25 mg total) by mouth daily. 90 tablet 3   nitroGLYCERIN (NITROSTAT) 0.4 MG SL tablet Dissolve 1 tablet under the tongue every 5 minutes as needed (Patient taking differently: Place 0.4 mg under the tongue every 5 (five) minutes as needed for chest pain.) 90 tablet 1   tamsulosin (FLOMAX) 0.4 MG CAPS capsule TAKE 1 CAPSULE BY MOUTH EVERY DAY 90 capsule 1   oxymetazoline (AFRIN) 0.05 % nasal spray Place 2-4 sprays into both nostrils at bedtime.     No facility-administered  medications prior to visit.   Past Medical History:  Diagnosis Date   BPH (benign prostatic hypertrophy)    BPH associated with nocturia 08/29/2013   BPH with obstruction/lower urinary tract symptoms 08/14/2019   CAD (coronary artery disease)    CAD (coronary artery disease), native coronary artery    PTCA of RCA 1986, PTCA of LAD 1992,  Negative treadmill Cardiolite 2011    Chronic dermatitis of hands 10/12/2015   Chronic venous insufficiency    Coagulation disorder (HCC) 10/26/2018   Diverticulitis    DVT (deep venous thrombosis) (HCC)    Dyslipidemia    Dysrhythmia    Eczema    Erectile dysfunction 07/11/2012   Essential hypertension    GERD 08/04/2006       GERD  (gastroesophageal reflux disease)    Hyperglycemia 07/08/2019   Hyperlipidemia    Hypertension    Incomplete emptying of bladder 08/14/2019   Long term current use of anticoagulant therapy    Lumbar disc disease    Overweight (BMI 25.0-29.9) 06/18/2015   Persistent atrial fibrillation (HCC) 04/10/2018   Personal history of DVT (deep vein thrombosis)    Initially in 1998 with recurrence in 2002 now on chronic warfarin    Soft tissue lesion of foot 08/27/2015   Stroke [facility])    Urinary tract infection with hematuria 08/14/2019   Past Surgical History:  Procedure Laterality Date   APPLICATION OF WOUND VAC  09/02/2020   Procedure: APPLICATION OF WOUND VAC;  Surgeon: Manus Rudd, MD;  Location: [facility] OR;  Service: General;;   CARDIAC CATHETERIZATION     CARPAL TUNNEL RELEASE  04/21/2011   Procedure: CARPAL TUNNEL RELEASE;  Surgeon: Tami Ribas, MD;  Location: [facility];  Service: Orthopedics;  Laterality: Left;   CARPAL TUNNEL RELEASE  05/23/2011   Procedure: CARPAL TUNNEL RELEASE;  Surgeon: Tami Ribas, MD;  Location: [facility];  Service: Orthopedics;  Laterality: Right;   COLON SURGERY  2010 and 2011 colostomy reversal   CORONARY ANGIOPLASTY     EYE SURGERY     repair of macular hole   LAPAROTOMY N/A 09/02/2020   Procedure: EXPLORATORY LAPAROTOMY, LYSIS OF ADHESIONS ,BOWEL RESECTION;  Surgeon: Manus Rudd, MD;  Location: [facility];  Service: General;  Laterality: N/A;   LUMBAR LAMINECTOMY/DECOMPRESSION MICRODISCECTOMY Left 03/18/2021   Procedure: LAMINOTOMY, FORAMINOTOMY, LEFT LUMBAR THREE-FOUR;  Surgeon: Lisbeth Renshaw, MD;  Location: [facility] OR;  Service: Neurosurgery;  Laterality: Left;   NASAL SEPTUM SURGERY     precutaneous transluminal coronary angioplasty     right hernia     TONSILLECTOMY AND ADENOIDECTOMY     Allergies  Allergen Reactions   Nitrous Oxide Other (See Comments)    Pt reports he "passed out and almost died" after inhaling gas through  mask at dentist office    Simvastatin Other (See Comments)    Muscle aches       Objective:    Physical Exam Vitals and nursing note reviewed.  Constitutional:      General: He is not in acute distress.    Appearance: Normal appearance.  HENT:     Head: Normocephalic.     Right Ear: There is impacted cerumen.     Left Ear: There is impacted cerumen.     Nose: Congestion and rhinorrhea present.     Mouth/Throat:     Mouth: Mucous membranes are moist.     Pharynx: Oropharyngeal exudate present. No pharyngeal swelling, posterior oropharyngeal erythema or uvula swelling.  Tonsils: No tonsillar exudate or tonsillar abscesses.  Cardiovascular:     Rate and Rhythm: Normal rate and regular rhythm.  Pulmonary:     Effort: Pulmonary effort is normal.     Breath sounds: Normal breath sounds.  Musculoskeletal:        General: Normal range of motion.     Cervical back: Normal range of motion.  Lymphadenopathy:     Head:     Right side of head: No submental, submandibular, tonsillar, preauricular or occipital adenopathy.     Left side of head: No submental, submandibular, tonsillar, preauricular or occipital adenopathy.     Cervical: No cervical adenopathy.  Skin:    General: Skin is warm and dry.  Neurological:     Mental Status: He is alert and oriented to person, place, and time.  Psychiatric:        Mood and Affect: Mood normal.    BP 131/86   Pulse 68   Temp (!) 94.6 F (34.8 C) (Temporal)   Ht 6' (1.829 m)   Wt 224 lb 9.6 oz (101.9 kg)   SpO2 99%   BMI 30.46 kg/m  Wt Readings from Last 3 Encounters:  08/24/22 224 lb 9.6 oz (101.9 kg)  08/15/22 224 lb 3.2 oz (101.7 kg)  04/08/22 224 lb 12.8 oz (102 kg)      Dulce Sellar, NP

## 2022-08-24 NOTE — Patient Instructions (Signed)
It was very nice to see you today!   I have sent an antibiotic and a steroid nasal spray to your pharmacy to treat your symptoms.  Follow instructions on the bottle. Stop using the Afrin nasal spray. Drink at least 8 cups of water daily, starting in the morning. Let us know if your symptoms are not improving after another week.      PLEASE NOTE:  If you had any lab tests please let us know if you have not heard back within a few days. You may see your results on MyChart before we have a chance to review them but we will give you a call once they are reviewed by [place]. If we ordered any referrals today, please let us know if you have not heard from their office within the next week.

## 2022-08-27 ENCOUNTER — Other Ambulatory Visit: Payer: Self-pay | Admitting: Family Medicine

## 2022-09-06 ENCOUNTER — Ambulatory Visit (INDEPENDENT_AMBULATORY_CARE_PROVIDER_SITE_OTHER): Payer: Medicare Other | Admitting: Family Medicine

## 2022-09-06 ENCOUNTER — Encounter: Payer: Self-pay | Admitting: Family Medicine

## 2022-09-06 VITALS — BP 116/70 | HR 83 | Temp 98.2°F | Ht 72.0 in | Wt 224.2 lb

## 2022-09-06 DIAGNOSIS — M5416 Radiculopathy, lumbar region: Secondary | ICD-10-CM | POA: Diagnosis not present

## 2022-09-06 DIAGNOSIS — N401 Enlarged prostate with lower urinary tract symptoms: Secondary | ICD-10-CM

## 2022-09-06 DIAGNOSIS — I4819 Other persistent atrial fibrillation: Secondary | ICD-10-CM | POA: Diagnosis not present

## 2022-09-06 DIAGNOSIS — R739 Hyperglycemia, unspecified: Secondary | ICD-10-CM | POA: Diagnosis not present

## 2022-09-06 DIAGNOSIS — I25118 Atherosclerotic heart disease of native coronary artery with other forms of angina pectoris: Secondary | ICD-10-CM

## 2022-09-06 DIAGNOSIS — R351 Nocturia: Secondary | ICD-10-CM

## 2022-09-06 DIAGNOSIS — E785 Hyperlipidemia, unspecified: Secondary | ICD-10-CM | POA: Diagnosis not present

## 2022-09-06 DIAGNOSIS — I10 Essential (primary) hypertension: Secondary | ICD-10-CM

## 2022-09-06 LAB — COMPREHENSIVE METABOLIC PANEL
ALT: 16 U/L (ref 0–53)
AST: 22 U/L (ref 0–37)
Albumin: 3.9 g/dL (ref 3.5–5.2)
Alkaline Phosphatase: 54 U/L (ref 39–117)
BUN: 16 mg/dL (ref 6–23)
CO2: 28 mEq/L (ref 19–32)
Calcium: 9.8 mg/dL (ref 8.4–10.5)
Chloride: 105 mEq/L (ref 96–112)
Creatinine, Ser: 0.79 mg/dL (ref 0.40–1.50)
GFR: 82.62 mL/min (ref >60.00)
Glucose, Bld: 95 mg/dL (ref 70–99)
Potassium: 4.3 mEq/L (ref 3.5–5.1)
Sodium: 138 mEq/L (ref 135–145)
Total Bilirubin: 0.9 mg/dL (ref 0.2–1.2)
Total Protein: 6.9 g/dL (ref 6.0–8.3)

## 2022-09-06 LAB — CBC
HCT: 43.9 % (ref 39.0–52.0)
Hemoglobin: 14.2 g/dL (ref 13.0–17.0)
MCHC: 32.2 g/dL (ref 30.0–36.0)
MCV: 90.7 fl (ref 78.0–100.0)
Platelets: 243 10*3/uL (ref 150.0–400.0)
RBC: 4.84 Mil/uL (ref 4.22–5.81)
RDW: 14.2 % (ref 11.5–15.5)
WBC: 7.1 10*3/uL (ref 4.0–10.5)

## 2022-09-06 LAB — LIPID PANEL
Cholesterol: 119 mg/dL (ref 0–200)
HDL: 43.3 mg/dL (ref >39.00)
LDL Cholesterol: 58 mg/dL (ref 0–99)
NonHDL: 75.51
Total CHOL/HDL Ratio: 3
Triglycerides: 90 mg/dL (ref 0.0–149.0)
VLDL: 18 mg/dL (ref 0.0–40.0)

## 2022-09-06 LAB — HEMOGLOBIN A1C: Hgb A1c MFr Bld: 5.9 % (ref 4.6–6.5)

## 2022-09-06 LAB — TSH: TSH: 2.43 u[IU]/mL (ref 0.35–5.50)

## 2022-09-06 NOTE — Assessment & Plan Note (Signed)
Follows with cardiology.  On Lipitor 20 mg daily.  Anticoagulated on Eliquis for PAF.

## 2022-09-06 NOTE — Patient Instructions (Addendum)
It was very nice to see you today!  We will check blood work today.  Please schedule appoint with Dr. Denyse Amass soon.  Please continue to work on his diet and exercise.  Pain is worse in the last  Return in about 6 months (around 03/09/2023) for Annual Physical.   Take care, Dr Jimmey Addie  PLEASE NOTE:  If you had any lab tests, please let us know if you have not heard back within a few days. You may see your results on mychart before we have a chance to review them but we will give you a call once they are reviewed by [place].   If we ordered any referrals today, please let us know if you have not heard from their office within the next week.   If you had any urgent prescriptions sent in today, please check with the pharmacy within an hour of our visit to make sure the prescription was transmitted appropriately.   Please try these tips to maintain a healthy lifestyle:  Eat at least 3 REAL meals and 1-2 snacks per day.  Aim for no more than 5 hours between eating.  If you eat breakfast, please do so within one hour of getting up.   Each meal should contain half fruits/vegetables, one quarter protein, and one quarter carbs (no bigger than a computer mouse)  Cut down on sweet beverages. This includes juice, soda, and sweet tea.   Drink at least 1 glass of water with each meal and aim for at least 8 glasses per day  Exercise at least 150 minutes every week.

## 2022-09-06 NOTE — Assessment & Plan Note (Addendum)
Check lipids.  On Lipitor 20 mg daily.

## 2022-09-06 NOTE — Assessment & Plan Note (Signed)
At goal today on atenolol 25 mg daily and losartan 25 mg daily.

## 2022-09-06 NOTE — Progress Notes (Signed)
   William Norman is a 82 y.o. male who presents today for an office visit.  Assessment/Plan:  Chronic Problems Addressed Today: Lumbar radiculopathy Worsening over the last several months.  It has been a while since he has seen sports medicine.  Advised him to call to schedule appointment soon.  He voiced understanding and will do this after our visit today.  Essential hypertension At goal today on atenolol 25 mg daily and losartan 25 mg daily.  Dyslipidemia Check lipids.  On Lipitor 20 mg daily.  CAD (coronary artery disease), native coronary artery Follows with cardiology.  On Lipitor 20 mg daily.  Anticoagulated on Eliquis for PAF.  BPH associated with nocturia Follows with urology at [facility].  On Flomax.  Persistent atrial fibrillation (HCC) Regular rate and rhythm today.  Anticoagulated on Eliquis.  Rate controlled on atenolol 25 mg daily.  Hyperglycemia Check A1c.     Subjective:  HPI:  See Assessment / plan for status of chronic conditions.  No acute concerns today.        Objective:  Physical Exam: BP 116/70   Pulse 83   Temp 98.2 F (36.8 C) (Temporal)   Ht 6' (1.829 m)   Wt 224 lb 3.2 oz (101.7 kg)   SpO2 95%   BMI 30.41 kg/m   Gen: No acute distress, resting comfortably CV: Regular rate and rhythm with no murmurs appreciated Pulm: Normal work of breathing, clear to auscultation bilaterally with no crackles, wheezes, or rhonchi Neuro: Grossly normal, moves all extremities Psych: Normal affect and thought content      William Syler M. Jimmey Curtez, MD 09/06/2022 2:33 PM

## 2022-09-06 NOTE — Assessment & Plan Note (Signed)
Follows with urology at [facility].  On Flomax.

## 2022-09-06 NOTE — Assessment & Plan Note (Signed)
Worsening over the last several months.  It has been a while since he has seen sports medicine.  Advised him to call to schedule appointment soon.  He voiced understanding and will do this after our visit today.

## 2022-09-06 NOTE — Assessment & Plan Note (Signed)
Regular rate and rhythm today.  Anticoagulated on Eliquis.  Rate controlled on atenolol 25 mg daily.

## 2022-09-06 NOTE — Assessment & Plan Note (Signed)
Check A1c. 

## 2022-09-07 ENCOUNTER — Ambulatory Visit: Payer: Medicare Other | Admitting: Family Medicine

## 2022-09-07 VITALS — BP 120/68 | HR 92 | Ht 72.0 in | Wt 222.0 lb

## 2022-09-07 DIAGNOSIS — M47816 Spondylosis without myelopathy or radiculopathy, lumbar region: Secondary | ICD-10-CM | POA: Diagnosis not present

## 2022-09-07 DIAGNOSIS — M545 Low back pain, unspecified: Secondary | ICD-10-CM | POA: Diagnosis not present

## 2022-09-07 NOTE — Patient Instructions (Addendum)
Thank you for coming in today.   Please call [facility] at [phone] to schedule your spine injection.    Let me know how these shots work.

## 2022-09-07 NOTE — Progress Notes (Signed)
William Payor, PhD, LAT, ATC acting as a scribe for William Graham, MD.  William Norman is a 82 y.o. male who presents to [facility] at [facility] today for low back pain. Pt was last seen by Dr. Denyse Amass on 12/06/21 and an ESI was ordered and later performed on 12/24/21.  Today, pt reports prior ESI was not very helpful. LBP has progressively worsened since then. Pt locates pain to the L-side of his low back w/ no radiating pain.   Radiating pain: no LE numbness/tingling: yes LE weakness: yes Treatments tried: prior ESI,   Dx imaging: 07/10/21 L-spine MRI             06/07/21 L knee XR (done @ [facility]) 03/18/21 L-spine XR 12/20/20 L-spine MRI             02/26/20 L-spine XR             02/15/07 L-spine XR  Pertinent review of systems: No fevers or chills  Relevant historical information: History lumbar surgery.   Exam:  BP 120/68   Pulse 92   Ht 6' (1.829 m)   Wt 222 lb (100.7 kg)   SpO2 94%   BMI 30.11 kg/m  General: Well Developed, well nourished, and in no acute distress.   MSK: L-spine: Normal appearing. Nontender palpation spinal midline.  Tender palpation left lumbar paraspinal musculature. Decreased lumbar motion.    Lab and Radiology Results  EXAM: MRI LUMBAR SPINE WITHOUT CONTRAST   TECHNIQUE: Multiplanar, multisequence MR imaging of the lumbar spine was performed. No intravenous contrast was administered.   COMPARISON:  MRI lumbar spine 12/20/2020   FINDINGS: Segmentation:  5 lumbar vertebra   Alignment: Mild retrolisthesis L1-2, L2-3, L3-4. Mild anterolisthesis L4-5 asymmetric to the right unchanged from the prior study   Vertebrae:  Negative for fracture or mass   Conus medullaris and cauda equina: Conus extends to the T12-L1 level. Conus and cauda equina appear normal.   Paraspinal and other soft tissues: Negative for paraspinous mass or adenopathy. Bladder trabeculation.   Disc levels:   L1-2: Mild  disc and facet degeneration. Asymmetric spurring lateral to the foramina left unchanged. Mild subarticular stenosis on the left   L2-3: Disc degeneration with diffuse disc bulging. Asymmetric disc protrusion on the left similar to the prior study. Moderate facet hypertrophy. Moderate central canal stenosis. Moderate to advanced subarticular foraminal stenosis on the left unchanged. Expected left L2 and L3 nerve root impingement.   L3-4: Left laminectomy is been performed in the interval. Disc degeneration with diffuse disc bulging. Moderate to advanced facet hypertrophy bilaterally. Spinal canal adequately decompressed. Moderate to advanced subarticular and foraminal stenosis bilaterally   L4-5: Severe facet degeneration asymmetric on the right. Severe subarticular and foraminal stenosis on the right due to spurring without interval change. Mild to moderate central canal stenosis unchanged. Mild left subarticular stenosis   L5-S1: Bilateral facet degeneration. Negative for disc protrusion. 4 mm synovial cyst on the left unchanged. No definite neural impingement.   IMPRESSION: 1. Moderate central canal stenosis L2-3 unchanged. Asymmetric disc protrusion on the left unchanged. Moderate to advanced subarticular and foraminal stenosis on the left unchanged. 2. Postop left laminectomy L3-4. Spinal canal adequately decompressed. Moderate to advanced subarticular and foraminal stenosis bilaterally similar to the prior study. 3. Severe facet degeneration on the right with asymmetric anterolisthesis of L4 on L5. Severe subarticular and foraminal stenosis on the right unchanged. 4. 4 mm synovial cyst on the left  unchanged without neural impingement.     Electronically Signed   By: Marlan Palau M.D.   On: 07/13/2021 11:37 I, William Norman, personally (independently) visualized and performed the interpretation of the images attached in this note.      Assessment and Plan: 82 y.o.  male with chronic left low back pain.  This is a bit different than his prior symptoms of radiculopathy.  He does not have much radiating pain today.  Dominant issue is just chronic low back pain.  He does have facet arthritis.  Will proceed to facet injections left L4-5 and left L5-S1.  Will keep me updated with how he progresses with these injections.   PDMP not reviewed this encounter. Orders Placed This Encounter  Procedures   DG FACET JT INJ L /S SINGLE LEVEL LEFT W/FL/CT    Standing Status:   Future    Standing Expiration Date:   09/07/2023    Order Specific Question:   Reason for Exam (SYMPTOM  OR DIAGNOSIS REQUIRED)    Answer:   Left L4-5 and L5-S1    Order Specific Question:   Preferred Imaging Location?    Answer:   [facility]    Order Specific Question:   Radiology Contrast Protocol - do NOT remove file path    Answer:   [email or web address]   DG FACET JT INJ L /S  2ND LEVEL LEFT W/FL/CT    Standing Status:   Future    Standing Expiration Date:   09/07/2023    Order Specific Question:   Reason for Exam (SYMPTOM  OR DIAGNOSIS REQUIRED)    Answer:   Left L4-5 and L5-S1    Order Specific Question:   Preferred Imaging Location?    Answer:   [facility]    Order Specific Question:   Radiology Contrast Protocol - do NOT remove file path    Answer:   \\[email or web address]   No orders of the defined types were placed in this encounter.    Discussed warning signs or symptoms. Please see discharge instructions. Patient expresses understanding.   The above documentation has been reviewed and is accurate and complete William Norman, M.D.

## 2022-09-07 NOTE — Progress Notes (Signed)
Great news!  Labs are all at goal.  He should keep up the great work and we can recheck in 6 to 12 months.

## 2022-09-08 NOTE — Progress Notes (Signed)
Left message to return call to our office at their convenience.  

## 2022-09-12 ENCOUNTER — Ambulatory Visit
Admission: RE | Admit: 2022-09-12 | Discharge: 2022-09-12 | Disposition: A | Discharge status: Home / Self Care | Payer: Medicare Other | Source: Ambulatory Visit | Attending: Family Medicine | Admitting: Family Medicine

## 2022-09-12 DIAGNOSIS — M47816 Spondylosis without myelopathy or radiculopathy, lumbar region: Secondary | ICD-10-CM

## 2022-09-12 DIAGNOSIS — M545 Low back pain, unspecified: Secondary | ICD-10-CM

## 2022-09-12 MED ORDER — METHYLPREDNISOLONE ACETATE 40 MG/ML INJ SUSP (RADIOLOG
80.0000 mg | Freq: Once | INTRAMUSCULAR | Status: AC
  Administered 2022-09-12: 80 mg via INTRA_ARTICULAR

## 2022-09-12 MED ORDER — IOPAMIDOL (ISOVUE-M 200) INJECTION 41%
1.0000 mL | Freq: Once | INTRAMUSCULAR | Status: AC
  Administered 2022-09-12: 1 mL via INTRA_ARTICULAR

## 2022-09-12 NOTE — Discharge Instructions (Signed)

## 2022-09-27 ENCOUNTER — Ambulatory Visit (HOSPITAL_BASED_OUTPATIENT_CLINIC_OR_DEPARTMENT_OTHER): Payer: Medicare Other | Admitting: Cardiology

## 2022-09-27 ENCOUNTER — Encounter (HOSPITAL_BASED_OUTPATIENT_CLINIC_OR_DEPARTMENT_OTHER): Payer: Self-pay | Admitting: Cardiology

## 2022-09-27 VITALS — BP 120/71 | HR 66 | Ht 72.0 in | Wt 219.2 lb

## 2022-09-27 DIAGNOSIS — D6869 Other thrombophilia: Secondary | ICD-10-CM | POA: Diagnosis not present

## 2022-09-27 DIAGNOSIS — I2510 Atherosclerotic heart disease of native coronary artery without angina pectoris: Secondary | ICD-10-CM

## 2022-09-27 DIAGNOSIS — Z8673 Personal history of transient ischemic attack (TIA), and cerebral infarction without residual deficits: Secondary | ICD-10-CM

## 2022-09-27 DIAGNOSIS — I4821 Permanent atrial fibrillation: Secondary | ICD-10-CM | POA: Diagnosis not present

## 2022-09-27 DIAGNOSIS — Z7901 Long term (current) use of anticoagulants: Secondary | ICD-10-CM | POA: Diagnosis not present

## 2022-09-27 DIAGNOSIS — I10 Essential (primary) hypertension: Secondary | ICD-10-CM

## 2022-09-27 NOTE — Patient Instructions (Signed)
Medication Instructions:  Continue same medications *If you need a refill on your cardiac medications before your next appointment, please call your pharmacy*   Lab Work: None ordered   Testing/Procedures: None ordered   Follow-Up: At [facility], you and your health needs are our priority.  As part of our continuing mission to provide you with exceptional heart care, we have created designated Provider Care Teams.  These Care Teams include your primary Cardiologist (physician) and Advanced Practice Providers (APPs -  Physician Assistants and Nurse Practitioners) who all work together to provide you with the care you need, when you need it.  We recommend signing up for the patient portal called "MyChart".  Sign up information is provided on this After Visit Summary.  MyChart is used to connect with patients for Virtual Visits (Telemedicine).  Patients are able to view lab/test results, encounter notes, upcoming appointments, etc.  Non-urgent messages can be sent to your provider as well.   To learn more about what you can do with MyChart, go to [email or web address].    Your next appointment:  6 months    Provider:  Dr.Christopher

## 2022-09-27 NOTE — Progress Notes (Signed)
  Cardiology Office Note:  .   Date:  09/27/2022  ID:  William Norman, DOB 1940-10-01, MRN [id number] PCP: [name], MD  [facility] Providers Cardiologist:  Jodelle Red, MD {  History of Present Illness: .   William Norman is a 82 y.o. male with a hx of permanent atrial fibrillation, chronic diastolic heart failure, history of CVA/TIA, CAD, mixed hyperlipidemia, hypertension who is seen as a new patient to me/prior patient of Dr. Dulce Sellar.   Today: Doing well overall. Climbs stairs in his house daily. Straight caths twice a day, no hematuria.   ROS: Denies chest pain, shortness of breath at rest or with normal exertion. No PND, orthopnea, LE edema or unexpected weight gain. No syncope or palpitations. ROS otherwise negative except as noted.   Studies Reviewed: Marland Kitchen    EKG:  EKG Interpretation Date/Time:  Tuesday September 27 2022 15:21:02 EDT Ventricular Rate:  66 PR Interval:    QRS Duration:  106 QT Interval:  404 QTC Calculation: 423 R Axis:   30  Text Interpretation: Atrial fibrillation Confirmed by Jodelle Red [phone]) on 09/27/2022 3:44:36 PM    Physical Exam:   VS:  BP 120/71 (BP Location: Left Arm, Patient Position: Sitting, Cuff Size: Large)   Pulse 66   Ht 6' (1.829 m)   Wt 219 lb 3.2 oz (99.4 kg)   BMI 29.73 kg/m    Wt Readings from Last 3 Encounters:  09/27/22 219 lb 3.2 oz (99.4 kg)  09/07/22 222 lb (100.7 kg)  09/06/22 224 lb 3.2 oz (101.7 kg)    GEN: Well nourished, well developed in no acute distress HEENT: Normal, moist mucous membranes NECK: No JVD CARDIAC: irregularly irregular rhythm, normal S1 and S2, no rubs or gallops. No murmur. VASCULAR: Radial and DP pulses 2+ bilaterally. No carotid bruits RESPIRATORY:  Clear to auscultation without rales, wheezing or rhonchi  ABDOMEN: Soft, non-tender, non-distended MUSCULOSKELETAL:  Ambulates independently SKIN: Warm and dry, no edema NEUROLOGIC:  Alert and oriented  x 3. No focal neuro deficits noted. PSYCHIATRIC:  Normal affect    ASSESSMENT AND PLAN: .    Permanent atrial fibrillation History of CVA -CHA2DS2/VAS Stroke Risk Points= 7. Has had TIA and possible CVA off of anticoagulation (only off one day), would bridge for procedures -on apixaban, tolerating without bleeding -on atenolol for rate control   Hypertension -continue losartan, furosemide   Chronic venous insufficiency -on low dose lasix -discussed compression stockings, elevation, activity   CAD with prior PCI -per Dr. York Spaniel notes, had cath in 1992 with PTCA of the LAD. Prior PTCA to the RCA in 1986 -continue statin -no aspirin as he is on apixaban  CV risk counseling and prevention -recommend heart healthy/Mediterranean diet, with whole grains, fruits, vegetable, fish, lean meats, nuts, and olive oil. Limit salt. -recommend moderate walking, 3-5 times/week for 30-50 minutes each session. Aim for at least 150 minutes.week. Goal should be pace of 3 miles/hours, or walking 1.5 miles in 30 minutes -recommend avoidance of tobacco products. Avoid excess alcohol.  Dispo: 6 mos  Signed, Jodelle Red, MD   Jodelle Red, MD, PhD, [facility]  [facility]    Heart & Vascular at [facility] at [facility] [address], [address] [place], [place] [address] [phone]

## 2022-10-12 ENCOUNTER — Ambulatory Visit: Payer: Medicare Other | Admitting: Podiatry

## 2022-10-12 ENCOUNTER — Encounter: Payer: Self-pay | Admitting: Podiatry

## 2022-10-12 DIAGNOSIS — M79676 Pain in unspecified toe(s): Secondary | ICD-10-CM

## 2022-10-12 DIAGNOSIS — B351 Tinea unguium: Secondary | ICD-10-CM | POA: Diagnosis not present

## 2022-10-12 NOTE — Progress Notes (Signed)
   Chief Complaint  Patient presents with   Foot Pain    New patient here for RFC    SUBJECTIVE Patient presents to office today complaining of elongated, thickened nails that cause pain while ambulating in shoes.  Patient is unable to trim their own nails.  Patient states that he was just into the office on 05/03/2022 with another physician and he was unsatisfied with the nail debridement.  Presenting today to have them reevaluated.   Past Medical History:  Diagnosis Date   BPH (benign prostatic hypertrophy)    BPH associated with nocturia 08/29/2013   BPH with obstruction/lower urinary tract symptoms 08/14/2019   CAD (coronary artery disease)    CAD (coronary artery disease), native coronary artery    PTCA of RCA 1986, PTCA of LAD 1992,  Negative treadmill Cardiolite 2011    Chronic dermatitis of hands 10/12/2015   Chronic venous insufficiency    Coagulation disorder (HCC) 10/26/2018   Diverticulitis    DVT (deep venous thrombosis) (HCC)    Dyslipidemia    Dysrhythmia    Eczema    Erectile dysfunction 07/11/2012   Essential hypertension    GERD 08/04/2006       GERD (gastroesophageal reflux disease)    Hyperglycemia 07/08/2019   Hyperlipidemia    Hypertension    Incomplete emptying of bladder 08/14/2019   Long term current use of anticoagulant therapy    Lumbar disc disease    Overweight (BMI 25.0-29.9) 06/18/2015   Persistent atrial fibrillation (HCC) 04/10/2018   Personal history of DVT (deep vein thrombosis)    Initially in 1998 with recurrence in 2002 now on chronic warfarin    Soft tissue lesion of foot 08/27/2015   Stroke (HCC)    Urinary tract infection with hematuria 08/14/2019    Allergies  Allergen Reactions   Nitrous Oxide Other (See Comments)    Pt reports he "passed out and almost died" after inhaling gas through mask at dentist office    Simvastatin Other (See Comments)    Muscle aches      OBJECTIVE General Patient is awake, alert, and oriented  x 3 and in no acute distress. Derm Skin is dry and supple bilateral. Negative open lesions or macerations. Remaining integument unremarkable. Nails are tender, long, thickened and dystrophic with subungual debris, consistent with onychomycosis, 1-5 bilateral. No signs of infection noted. Vasc  DP and PT pedal pulses palpable bilaterally. Temperature gradient within normal limits.  Neuro Epicritic and protective threshold sensation grossly intact bilaterally.  Musculoskeletal Exam No symptomatic pedal deformities noted bilateral. Muscular strength within normal limits.  ASSESSMENT 1.  Pain due to onychomycosis of toenails both  PLAN OF CARE 1. Patient evaluated today.  2. Instructed to maintain good pedal hygiene and foot care.  3. Mechanical debridement of nails 1-5 bilaterally performed using a nail nipper. Filed with dremel without incident.  4. Return to clinic in 3 mos.    Felecia Shelling, DPM [facility]  Dr. Felecia Shelling, DPM    [address] [place], [place] [address]                Office [phone]  Fax [phone]

## 2022-10-15 ENCOUNTER — Encounter: Payer: Self-pay | Admitting: Podiatry

## 2022-10-31 ENCOUNTER — Emergency Department (HOSPITAL_COMMUNITY): Payer: Medicare Other

## 2022-10-31 ENCOUNTER — Encounter (HOSPITAL_COMMUNITY): Payer: Self-pay | Admitting: Emergency Medicine

## 2022-10-31 ENCOUNTER — Other Ambulatory Visit: Payer: Self-pay

## 2022-10-31 ENCOUNTER — Inpatient Hospital Stay (HOSPITAL_COMMUNITY)
Admission: EM | Admit: 2022-10-31 | Discharge: 2022-11-02 | DRG: 698 | Disposition: A | Discharge status: Home / Self Care | Payer: Medicare Other | Attending: Internal Medicine | Admitting: Internal Medicine

## 2022-10-31 DIAGNOSIS — Z823 Family history of stroke: Secondary | ICD-10-CM

## 2022-10-31 DIAGNOSIS — Z86718 Personal history of other venous thrombosis and embolism: Secondary | ICD-10-CM

## 2022-10-31 DIAGNOSIS — R6510 Systemic inflammatory response syndrome (SIRS) of non-infectious origin without acute organ dysfunction: Secondary | ICD-10-CM | POA: Diagnosis not present

## 2022-10-31 DIAGNOSIS — Y846 Urinary catheterization as the cause of abnormal reaction of the patient, or of later complication, without mention of misadventure at the time of the procedure: Secondary | ICD-10-CM | POA: Diagnosis present

## 2022-10-31 DIAGNOSIS — E876 Hypokalemia: Secondary | ICD-10-CM | POA: Diagnosis present

## 2022-10-31 DIAGNOSIS — A419 Sepsis, unspecified organism: Secondary | ICD-10-CM | POA: Diagnosis not present

## 2022-10-31 DIAGNOSIS — Z825 Family history of asthma and other chronic lower respiratory diseases: Secondary | ICD-10-CM

## 2022-10-31 DIAGNOSIS — Z79899 Other long term (current) drug therapy: Secondary | ICD-10-CM

## 2022-10-31 DIAGNOSIS — T83518A Infection and inflammatory reaction due to other urinary catheter, initial encounter: Secondary | ICD-10-CM | POA: Diagnosis not present

## 2022-10-31 DIAGNOSIS — I2510 Atherosclerotic heart disease of native coronary artery without angina pectoris: Secondary | ICD-10-CM | POA: Diagnosis present

## 2022-10-31 DIAGNOSIS — Z8249 Family history of ischemic heart disease and other diseases of the circulatory system: Secondary | ICD-10-CM

## 2022-10-31 DIAGNOSIS — R338 Other retention of urine: Secondary | ICD-10-CM | POA: Diagnosis present

## 2022-10-31 DIAGNOSIS — G250 Essential tremor: Secondary | ICD-10-CM | POA: Diagnosis present

## 2022-10-31 DIAGNOSIS — Z9861 Coronary angioplasty status: Secondary | ICD-10-CM

## 2022-10-31 DIAGNOSIS — E785 Hyperlipidemia, unspecified: Secondary | ICD-10-CM | POA: Diagnosis present

## 2022-10-31 DIAGNOSIS — A4159 Other Gram-negative sepsis: Secondary | ICD-10-CM | POA: Diagnosis present

## 2022-10-31 DIAGNOSIS — Z7901 Long term (current) use of anticoagulants: Secondary | ICD-10-CM

## 2022-10-31 DIAGNOSIS — R339 Retention of urine, unspecified: Principal | ICD-10-CM

## 2022-10-31 DIAGNOSIS — I4819 Other persistent atrial fibrillation: Secondary | ICD-10-CM | POA: Diagnosis present

## 2022-10-31 DIAGNOSIS — K219 Gastro-esophageal reflux disease without esophagitis: Secondary | ICD-10-CM | POA: Diagnosis present

## 2022-10-31 DIAGNOSIS — N401 Enlarged prostate with lower urinary tract symptoms: Secondary | ICD-10-CM | POA: Diagnosis present

## 2022-10-31 DIAGNOSIS — I714 Abdominal aortic aneurysm, without rupture, unspecified: Secondary | ICD-10-CM | POA: Diagnosis present

## 2022-10-31 DIAGNOSIS — Z87891 Personal history of nicotine dependence: Secondary | ICD-10-CM

## 2022-10-31 DIAGNOSIS — N390 Urinary tract infection, site not specified: Secondary | ICD-10-CM | POA: Diagnosis not present

## 2022-10-31 DIAGNOSIS — I10 Essential (primary) hypertension: Secondary | ICD-10-CM | POA: Diagnosis present

## 2022-10-31 DIAGNOSIS — Z8673 Personal history of transient ischemic attack (TIA), and cerebral infarction without residual deficits: Secondary | ICD-10-CM

## 2022-10-31 DIAGNOSIS — E860 Dehydration: Secondary | ICD-10-CM | POA: Diagnosis present

## 2022-10-31 LAB — COMPREHENSIVE METABOLIC PANEL
ALT: 21 U/L (ref 0–44)
AST: 26 U/L (ref 15–41)
Albumin: 3.4 g/dL — ABNORMAL LOW (ref 3.5–5.0)
Alkaline Phosphatase: 47 U/L (ref 38–126)
Anion gap: 11 (ref 5–15)
BUN: 20 mg/dL (ref 8–23)
CO2: 19 mmol/L — ABNORMAL LOW (ref 22–32)
Calcium: 8.8 mg/dL — ABNORMAL LOW (ref 8.9–10.3)
Chloride: 105 mmol/L (ref 98–111)
Creatinine, Ser: 1 mg/dL (ref 0.61–1.24)
GFR, Estimated: >60 mL/min (ref >60)
Glucose, Bld: 143 mg/dL — ABNORMAL HIGH (ref 70–99)
Potassium: 3.6 mmol/L (ref 3.5–5.1)
Sodium: 135 mmol/L (ref 135–145)
Total Bilirubin: 1.8 mg/dL — ABNORMAL HIGH (ref 0.3–1.2)
Total Protein: 6.3 g/dL — ABNORMAL LOW (ref 6.5–8.1)

## 2022-10-31 LAB — CBC WITH DIFFERENTIAL/PLATELET
Abs Immature Granulocytes: 0.09 10*3/uL — ABNORMAL HIGH (ref 0.00–0.07)
Basophils Absolute: 0 10*3/uL (ref 0.0–0.1)
Basophils Relative: 0 %
Eosinophils Absolute: 0 10*3/uL (ref 0.0–0.5)
Eosinophils Relative: 0 %
HCT: 45.8 % (ref 39.0–52.0)
Hemoglobin: 15 g/dL (ref 13.0–17.0)
Immature Granulocytes: 1 %
Lymphocytes Relative: 4 %
Lymphs Abs: 0.5 10*3/uL — ABNORMAL LOW (ref 0.7–4.0)
MCH: 30.4 pg (ref 26.0–34.0)
MCHC: 32.8 g/dL (ref 30.0–36.0)
MCV: 92.7 fL (ref 80.0–100.0)
Monocytes Absolute: 1.1 10*3/uL — ABNORMAL HIGH (ref 0.1–1.0)
Monocytes Relative: 9 %
Neutro Abs: 10.2 10*3/uL — ABNORMAL HIGH (ref 1.7–7.7)
Neutrophils Relative %: 86 %
Platelets: 144 10*3/uL — ABNORMAL LOW (ref 150–400)
RBC: 4.94 MIL/uL (ref 4.22–5.81)
RDW: 15.4 % (ref 11.5–15.5)
WBC: 11.9 10*3/uL — ABNORMAL HIGH (ref 4.0–10.5)
nRBC: 0 % (ref 0.0–0.2)

## 2022-10-31 LAB — LACTIC ACID, PLASMA: Lactic Acid, Venous: 1.7 mmol/L (ref 0.5–1.9)

## 2022-10-31 LAB — URINALYSIS, W/ REFLEX TO CULTURE (INFECTION SUSPECTED)
Bilirubin Urine: NEGATIVE
Glucose, UA: NEGATIVE mg/dL
Ketones, ur: NEGATIVE mg/dL
Nitrite: POSITIVE — AB
Protein, ur: NEGATIVE mg/dL
Specific Gravity, Urine: 1.02 (ref 1.005–1.030)
WBC, UA: >50 WBC/hpf (ref 0–5)
pH: 6 (ref 5.0–8.0)

## 2022-10-31 LAB — PROTIME-INR
INR: 1.2 (ref 0.8–1.2)
Prothrombin Time: 15.6 s — ABNORMAL HIGH (ref 11.4–15.2)

## 2022-10-31 LAB — PROCALCITONIN: Procalcitonin: 0.28 ng/mL

## 2022-10-31 LAB — APTT: aPTT: 33 s (ref 24–36)

## 2022-10-31 MED ORDER — SODIUM CHLORIDE 0.9 % IV SOLN
2.0000 g | INTRAVENOUS | Status: DC
  Administered 2022-10-31 – 2022-11-02 (×3): 2 g via INTRAVENOUS
  Filled 2022-10-31 (×3): qty 20

## 2022-10-31 MED ORDER — CHLORHEXIDINE GLUCONATE CLOTH 2 % EX PADS
6.0000 | MEDICATED_PAD | Freq: Every day | CUTANEOUS | Status: DC
  Administered 2022-10-31 – 2022-11-02 (×3): 6 via TOPICAL

## 2022-10-31 MED ORDER — TAMSULOSIN HCL 0.4 MG PO CAPS
0.4000 mg | ORAL_CAPSULE | Freq: Two times a day (BID) | ORAL | Status: DC
  Administered 2022-10-31 – 2022-11-02 (×5): 0.4 mg via ORAL
  Filled 2022-10-31 (×5): qty 1

## 2022-10-31 MED ORDER — LOSARTAN POTASSIUM 50 MG PO TABS
25.0000 mg | ORAL_TABLET | Freq: Every day | ORAL | Status: DC
  Administered 2022-10-31 – 2022-11-02 (×3): 25 mg via ORAL
  Filled 2022-10-31 (×3): qty 1

## 2022-10-31 MED ORDER — ATENOLOL 25 MG PO TABS
25.0000 mg | ORAL_TABLET | Freq: Every day | ORAL | Status: DC
  Administered 2022-10-31 – 2022-11-02 (×3): 25 mg via ORAL
  Filled 2022-10-31 (×4): qty 1

## 2022-10-31 MED ORDER — ACETAMINOPHEN 325 MG PO TABS: 650.0000 mg | ORAL_TABLET | ORAL | Status: DC | PRN

## 2022-10-31 MED ORDER — ONDANSETRON HCL 4 MG/2ML IJ SOLN: 4.0000 mg | Freq: Four times a day (QID) | INTRAMUSCULAR | Status: DC | PRN

## 2022-10-31 MED ORDER — SODIUM CHLORIDE 0.9% FLUSH
3.0000 mL | Freq: Two times a day (BID) | INTRAVENOUS | Status: DC
  Administered 2022-10-31 – 2022-11-02 (×5): 3 mL via INTRAVENOUS

## 2022-10-31 MED ORDER — APIXABAN 5 MG PO TABS
5.0000 mg | ORAL_TABLET | Freq: Two times a day (BID) | ORAL | Status: DC
  Administered 2022-10-31 – 2022-11-02 (×5): 5 mg via ORAL
  Filled 2022-10-31 (×5): qty 1

## 2022-10-31 MED ORDER — SODIUM CHLORIDE 0.9 % IV BOLUS (SEPSIS)
1000.0000 mL | Freq: Once | INTRAVENOUS | Status: AC
  Administered 2022-10-31: 1000 mL via INTRAVENOUS

## 2022-10-31 MED ORDER — ATORVASTATIN CALCIUM 40 MG PO TABS
20.0000 mg | ORAL_TABLET | Freq: Every day | ORAL | Status: DC
  Administered 2022-10-31 – 2022-11-02 (×3): 20 mg via ORAL
  Filled 2022-10-31 (×3): qty 1

## 2022-10-31 MED ORDER — SODIUM CHLORIDE 0.9 % IV SOLN
1000.0000 mL | INTRAVENOUS | Status: DC
  Administered 2022-10-31 – 2022-11-01 (×3): 1000 mL via INTRAVENOUS

## 2022-10-31 NOTE — Discharge Instructions (Signed)

## 2022-10-31 NOTE — ED Triage Notes (Signed)
Patient BIB [facility] from home c/o not being able to urinate x 48 hours.  Patient normally self caths but has been unable to reach.  EMS reports patient was 100.3 temporal thermometer and gave 650 mg tylenol.  EMS also reports tremors on left side x 3 days, stroke screen negative.  Patient endorses lower back pain when he walks.  Patient has a history of frequent UTIs.  130/90 30 RR Etco2 28 HR 110 88% RA, placed on 2L [place] [facility]G 154  18 L AC

## 2022-10-31 NOTE — ED Provider Notes (Signed)
[place] EMERGENCY DEPARTMENT AT [facility] Provider Note  CSN: [id number] Arrival date & time: 10/31/22 3875  Chief Complaint(s) Urinary Retention  HPI William Norman is a 82 y.o. male with a past medical history listed below who presents to the emergency department for inability to void in the past 48 hours.  Patient normally self caths but has been unable to cath themselves in that timeframe.  States that he feels suprapubic discomfort and bilateral flank pain worse than usual.  No nausea or vomiting.  Denies any chills.  No coughing or congestion.  No diarrhea or constipation.  Called EMS due to the urinary retention who noted patient was mildly tachycardic, tremulous with a temporal temp of 100.3.  Patient was given 650 mg of Tylenol.  The history is provided by the patient and the EMS personnel.    Past Medical History Past Medical History:  Diagnosis Date   BPH (benign prostatic hypertrophy)    BPH associated with nocturia 08/29/2013   BPH with obstruction/lower urinary tract symptoms 08/14/2019   CAD (coronary artery disease)    CAD (coronary artery disease), native coronary artery    PTCA of RCA 1986, PTCA of LAD 1992,  Negative treadmill Cardiolite 2011    Chronic dermatitis of hands 10/12/2015   Chronic venous insufficiency    Coagulation disorder (HCC) 10/26/2018   Diverticulitis    DVT (deep venous thrombosis) (HCC)    Dyslipidemia    Dysrhythmia    Eczema    Erectile dysfunction 07/11/2012   Essential hypertension    GERD 08/04/2006       GERD (gastroesophageal reflux disease)    Hyperglycemia 07/08/2019   Hyperlipidemia    Hypertension    Incomplete emptying of bladder 08/14/2019   Long term current use of anticoagulant therapy    Lumbar disc disease    Overweight (BMI 25.0-29.9) 06/18/2015   Persistent atrial fibrillation (HCC) 04/10/2018   Personal history of DVT (deep vein thrombosis)    Initially in 1998 with recurrence in 2002 now  on chronic warfarin    Soft tissue lesion of foot 08/27/2015   Stroke (HCC)    Urinary tract infection with hematuria 08/14/2019   Patient Active Problem List   Diagnosis Date Noted   Permanent atrial fibrillation (HCC) 03/29/2022   Secondary hypercoagulable state (HCC) 03/29/2022   History of CVA (cerebrovascular accident) 03/29/2022   Depression, major, single episode, mild (HCC) 08/24/2021   Osteoarthritis 11/20/2020   Constipation 10/02/2020   Protein-calorie malnutrition, severe 09/03/2020   SBO (small bowel obstruction) (HCC) 08/28/2020   AAA (abdominal aortic aneurysm) (HCC) 03/06/2020   History of ischemic stroke 02/16/2020   Recurrent UTI 08/14/2019   Hyperglycemia 07/08/2019   Persistent atrial fibrillation (HCC) 04/10/2018   Chronic dermatitis of hands 10/12/2015   Stroke (HCC) 06/18/2015   BPH associated with nocturia 08/29/2013   CAD (coronary artery disease), native coronary artery    Long term current use of anticoagulant    Personal history of DVT (deep vein thrombosis)    Chronic venous insufficiency    Erectile dysfunction 07/11/2012   Lumbar radiculopathy    Dyslipidemia    Essential hypertension    Home Medication(s) Prior to Admission medications   Medication Sig Start Date End Date Taking? Authorizing Provider  acetaminophen (TYLENOL) 500 MG tablet Take 1,500 mg by mouth every morning.   Yes [provider]  apixaban (ELIQUIS) 5 MG TABS tablet Take 1 tablet (5 mg total) by mouth 2 (two) times daily.  03/20/21  Yes Lisbeth Renshaw, MD  atenolol (TENORMIN) 25 MG tablet Take 1 tablet (25 mg total) by mouth daily. 06/07/22  Yes Jodelle Red, MD  atorvastatin (LIPITOR) 40 MG tablet TAKE 1/2 TABLET BY MOUTH DAILY 08/29/22  Yes [name], MD  Coenzyme Q10 (CO Q 10 PO) Take 1 capsule by mouth in the morning.   Yes [provider]  Cyanocobalamin (VITAMIN B-12) 1000 MCG SUBL Place 1 tablet (1,000 mcg total) under the tongue daily.  07/24/20  Yes [name], MD  furosemide (LASIX) 20 MG tablet Take 1 tablet (20 mg total) by mouth daily. 06/07/22  Yes Jodelle Red, MD  hydroxypropyl methylcellulose / hypromellose (ISOPTO TEARS / GONIOVISC) 2.5 % ophthalmic solution Place 1 drop into both eyes in the morning.   Yes [provider]  losartan (COZAAR) 25 MG tablet Take 1 tablet (25 mg total) by mouth daily. 05/17/22  Yes Willow Ora, MD  nitroGLYCERIN (NITROSTAT) 0.4 MG SL tablet Dissolve 1 tablet under the tongue every 5 minutes as needed Patient taking differently: Place 0.4 mg under the tongue every 5 (five) minutes as needed for chest pain. 03/09/21  Yes Baldo Daub, MD  tamsulosin (FLOMAX) 0.4 MG CAPS capsule TAKE 1 CAPSULE BY MOUTH EVERY DAY Patient taking differently: Take 0.4 mg by mouth in the morning and at bedtime. 12/30/21  Yes [name], MD  triamcinolone (NASACORT) 55 MCG/ACT AERO nasal inhaler Place 1 spray into the nose daily. Start with 1 spray each side twice a day for 3 days, then reduce to daily until sinus symptoms are better. 08/24/22  Yes Dulce Sellar, NP                                                                                                                                    Allergies Nitrous oxide and Simvastatin  Review of Systems Review of Systems As noted in HPI  Physical Exam Vital Signs  I have reviewed the triage vital signs BP (!) 140/84   Pulse 85   Temp 97.8 F (36.6 C) (Oral)   Resp (!) 24   Wt 100 kg   SpO2 100%   BMI 29.90 kg/m   Physical Exam Vitals reviewed. Exam conducted with a chaperone present.  Constitutional:      General: He is not in acute distress.    Appearance: He is well-developed. He is not diaphoretic.  HENT:     Head: Normocephalic and atraumatic.     Right Ear: External ear normal.     Left Ear: External ear normal.     Nose: Nose normal.     Mouth/Throat:     Mouth: Mucous membranes are moist.  Eyes:      General: No scleral icterus.    Conjunctiva/sclera: Conjunctivae normal.  Neck:     Trachea: Phonation normal.  Cardiovascular:     Rate and Rhythm: Tachycardia present.  Rhythm irregularly irregular.  Pulmonary:     Effort: Pulmonary effort is normal. No respiratory distress.     Breath sounds: No stridor.  Abdominal:     General: There is no distension.     Tenderness: There is abdominal tenderness in the suprapubic area.  Genitourinary:    Penis: No erythema, tenderness or discharge.      Testes: Normal.  Musculoskeletal:        General: Normal range of motion.     Cervical back: Normal range of motion.  Neurological:     Mental Status: He is alert and oriented to person, place, and time.  Psychiatric:        Behavior: Behavior normal.     ED Results and Treatments Labs (all labs ordered are listed, but only abnormal results are displayed) Labs Reviewed  CBC WITH DIFFERENTIAL/PLATELET - Abnormal; Notable for the following components:      Result Value   WBC 11.9 (*)    Platelets 144 (*)    Neutro Abs 10.2 (*)    Lymphs Abs 0.5 (*)    Monocytes Absolute 1.1 (*)    Abs Immature Granulocytes 0.09 (*)    All other components within normal limits  COMPREHENSIVE METABOLIC PANEL - Abnormal; Notable for the following components:   CO2 19 (*)    Glucose, Bld 143 (*)    Calcium 8.8 (*)    Total Protein 6.3 (*)    Albumin 3.4 (*)    Total Bilirubin 1.8 (*)    All other components within normal limits  URINALYSIS, W/ REFLEX TO CULTURE (INFECTION SUSPECTED) - Abnormal; Notable for the following components:   APPearance CLOUDY (*)    Hgb urine dipstick LARGE (*)    Nitrite POSITIVE (*)    Leukocytes,Ua LARGE (*)    Bacteria, UA MANY (*)    All other components within normal limits  PROTIME-INR - Abnormal; Notable for the following components:   Prothrombin Time 15.6 (*)    All other components within normal limits  CULTURE, BLOOD (ROUTINE X 2)  CULTURE, BLOOD (ROUTINE X  2)  URINE CULTURE  LACTIC ACID, PLASMA  APTT  LACTIC ACID, PLASMA                                                                                                                         EKG  EKG Interpretation Date/Time:  Monday October 31 2022 02:51:36 EDT Ventricular Rate:  109 PR Interval:    QRS Duration:  100 QT Interval:  349 QTC Calculation: 470 R Axis:   43  Text Interpretation: Atrial fibrillation Ventricular premature complex Confirmed by Drema Pry [phone]) on 10/31/2022 3:54:06 AM       Radiology DG Chest Port 1 View  Result Date: 10/31/2022 CLINICAL DATA:  Possible sepsis. EXAM: PORTABLE CHEST 1 VIEW COMPARISON:  05/27/2021. FINDINGS: The heart size and mediastinal contours are within normal limits. There is atherosclerotic calcification of the aorta. The pulmonary vasculature is mildly  distended. There are low lung volumes with atelectasis at the lung bases. No effusion or pneumothorax. No acute osseous abnormality. IMPRESSION: 1. Mild pulmonary vascular congestion. 2. Low lung volumes with atelectasis at the lung bases. Electronically Signed   By: Thornell Sartorius M.D.   On: 10/31/2022 03:27    Medications Ordered in ED Medications  sodium chloride 0.9 % bolus 1,000 mL (0 mLs Intravenous Stopped 10/31/22 0551)    Followed by  0.9 %  sodium chloride infusion (1,000 mLs Intravenous New Bag/Given 10/31/22 0558)  cefTRIAXone (ROCEPHIN) 2 g in sodium chloride 0.9 % 100 mL IVPB (0 g Intravenous Stopped 10/31/22 0501)   Procedures .1-3 Lead EKG Interpretation  Performed by: Nira Conn, MD Authorized by: Nira Conn, MD     Interpretation: abnormal     ECG rate:  113   ECG rate assessment: tachycardic     Rhythm: atrial fibrillation     Ectopy: none     Conduction: normal   .Critical Care  Performed by: Nira Conn, MD Authorized by: Nira Conn, MD   Critical care provider statement:    Critical care time (minutes):   45   Critical care time was exclusive of:  Separately billable procedures and treating other patients   Critical care was necessary to treat or prevent imminent or life-threatening deterioration of the following conditions:  Sepsis   Critical care was time spent personally by me on the following activities:  Development of treatment plan with patient or surrogate, discussions with consultants, evaluation of patient's response to treatment, examination of patient, obtaining history from patient or surrogate, review of old charts, re-evaluation of patient's condition, pulse oximetry, ordering and review of radiographic studies, ordering and review of laboratory studies and ordering and performing treatments and interventions   Care discussed with: admitting provider     (including critical care time) Medical Decision Making / ED Course   Medical Decision Making Amount and/or Complexity of Data Reviewed Labs: ordered. Decision-making details documented in ED Course. Radiology: ordered and independent interpretation performed. Decision-making details documented in ED Course. ECG/medicine tests: ordered and independent interpretation performed. Decision-making details documented in ED Course.  Risk Prescription drug management. Decision regarding hospitalization.     Differential diagnosis these listed below.  Urinary retention in patient who self caths with frequent urinary tract infections, bilateral flank pain and low-grade fever with EMS.  Presentation is concerning for likely urinary tract infection.  Will need to rule out sepsis.    Rectal temp confirmed temperature of 102.6. Septic workup initiated Bladder scan reportedly 120 cc. Foley inserted and retrieved greater than 600 cc initially.  CBC with leukocytosis.  No anemia. CMP without significant electrolyte derangements.  No renal insufficiency. UA is consistent with a urinary tract infection.  Will send for cultures. Lactic  acid normal at 1.7 ruling out severe sepsis However patient does meet septic criteria, which was activated and he was started on empiric antibiotics.  No need for 30 cc/kg of IV fluids as he is hemodynamically stable.  Abdomen no longer tender after bladder drained. Doubt any other serious intra-abdominal inflammatory/infectious process requiring advanced imaging at this time.  Discussed case with Dr. Janalyn Shy from hospitalist service who will admit patient for further management.      Final Clinical Impression(s) / ED Diagnoses Final diagnoses:  Urinary retention  Acute UTI  Sepsis without acute organ dysfunction, due to unspecified organism [facility])    This chart was dictated using voice recognition software.  Despite best efforts to proofread,  errors can occur which can change the documentation meaning.    Nira Conn, MD 10/31/22 [phone]

## 2022-10-31 NOTE — ED Notes (Signed)
ED TO INPATIENT HANDOFF REPORT  ED Nurse Name and Phone #: Josh  S Name/Age/Gender William Norman 82 y.o. male Room/Bed: 015C/015C  Code Status   Code Status: Full Code  Home/SNF/Other Home Patient oriented to: self, place, time, and situation Is this baseline? Yes   Triage Complete: Triage complete  Chief Complaint SIRS (systemic inflammatory response syndrome) (HCC) [R65.10]  Triage Note Patient BIB [facility] from home c/o not being able to urinate x 48 hours.  Patient normally self caths but has been unable to reach.  EMS reports patient was 100.3 temporal thermometer and gave 650 mg tylenol.  EMS also reports tremors on left side x 3 days, stroke screen negative.  Patient endorses lower back pain when he walks.  Patient has a history of frequent UTIs.  130/90 30 RR Etco2 28 HR 110 88% RA, placed on 2L [place] [facility] 154  18 L AC    Allergies Allergies  Allergen Reactions   Nitrous Oxide Other (See Comments)    Pt reports he "passed out and almost died" after inhaling gas through mask at dentist office    Simvastatin Other (See Comments)    Muscle aches     Level of Care/Admitting Diagnosis ED Disposition     ED Disposition  Admit   Condition  --   Comment  Hospital Area: [facility] [[id number]]  Level of Care: Med-Surg [16]  May place patient in observation at [facility] or [facility] if equivalent level of care is available:: Yes  Covid Evaluation: Asymptomatic - no recent exposure (last 10 days) testing not required  Diagnosis: SIRS (systemic inflammatory response syndrome) [facility]) [[id number]]  Admitting Physician: Meredeth Ide [[id number]]  Attending Physician: Meredeth Ide [[id number]]          B Medical/Surgery History Past Medical History:  Diagnosis Date   BPH (benign prostatic hypertrophy)    BPH associated with nocturia 08/29/2013   BPH with obstruction/lower urinary tract symptoms 08/14/2019   CAD (coronary artery disease)    CAD  (coronary artery disease), native coronary artery    PTCA of RCA 1986, PTCA of LAD 1992,  Negative treadmill Cardiolite 2011    Chronic dermatitis of hands 10/12/2015   Chronic venous insufficiency    Coagulation disorder (HCC) 10/26/2018   Diverticulitis    DVT (deep venous thrombosis) (HCC)    Dyslipidemia    Dysrhythmia    Eczema    Erectile dysfunction 07/11/2012   Essential hypertension    GERD 08/04/2006       GERD (gastroesophageal reflux disease)    Hyperglycemia 07/08/2019   Hyperlipidemia    Hypertension    Incomplete emptying of bladder 08/14/2019   Long term current use of anticoagulant therapy    Lumbar disc disease    Overweight (BMI 25.0-29.9) 06/18/2015   Persistent atrial fibrillation (HCC) 04/10/2018   Personal history of DVT (deep vein thrombosis)    Initially in 1998 with recurrence in 2002 now on chronic warfarin    Soft tissue lesion of foot 08/27/2015   Stroke [facility])    Urinary tract infection with hematuria 08/14/2019   Past Surgical History:  Procedure Laterality Date   APPLICATION OF WOUND VAC  09/02/2020   Procedure: APPLICATION OF WOUND VAC;  Surgeon: Manus Rudd, MD;  Location: [facility] OR;  Service: General;;   CARDIAC CATHETERIZATION     CARPAL TUNNEL RELEASE  04/21/2011   Procedure: CARPAL TUNNEL RELEASE;  Surgeon: Tami Ribas, MD;  Location: [facility];  Service: Orthopedics;  Laterality: Left;   CARPAL TUNNEL RELEASE  05/23/2011   Procedure: CARPAL TUNNEL RELEASE;  Surgeon: Tami Ribas, MD;  Location: [facility];  Service: Orthopedics;  Laterality: Right;   COLON SURGERY  2010 and 2011 colostomy reversal   CORONARY ANGIOPLASTY     EYE SURGERY     repair of macular hole   LAPAROTOMY N/A 09/02/2020   Procedure: EXPLORATORY LAPAROTOMY, LYSIS OF ADHESIONS ,BOWEL RESECTION;  Surgeon: Manus Rudd, MD;  Location: [facility] OR;  Service: General;  Laterality: N/A;   LUMBAR LAMINECTOMY/DECOMPRESSION MICRODISCECTOMY Left 03/18/2021    Procedure: LAMINOTOMY, FORAMINOTOMY, LEFT LUMBAR THREE-FOUR;  Surgeon: Lisbeth Renshaw, MD;  Location: [facility] OR;  Service: Neurosurgery;  Laterality: Left;   NASAL SEPTUM SURGERY     precutaneous transluminal coronary angioplasty     right hernia     TONSILLECTOMY AND ADENOIDECTOMY       A IV Location/Drains/Wounds Patient Lines/Drains/Airways Status     Active Line/Drains/Airways     Name Placement date Placement time Site Days   Peripheral IV 10/31/22 18 G Left Antecubital 10/31/22  0232  Antecubital  less than 1   Peripheral IV 10/31/22 20 G Posterior;Right Hand 10/31/22  0225  Hand  less than 1   Urethral Catheter Melinda RN Straight-tip 16 Fr. 10/31/22  0315  Straight-tip  less than 1            Intake/Output Last 24 hours  Intake/Output Summary (Last 24 hours) at 10/31/2022 0804 Last data filed at 10/31/2022 0732 Gross per 24 hour  Intake 1100 ml  Output 1150 ml  Net -50 ml    Labs/Imaging Results for orders placed or performed during the hospital encounter of 10/31/22 (from the past 48 hour(s))  CBC with Differential     Status: Abnormal   Collection Time: 10/31/22  3:04 AM  Result Value Ref Range   WBC 11.9 (H) 4.0 - 10.5 K/uL   RBC 4.94 4.22 - 5.81 MIL/uL   Hemoglobin 15.0 13.0 - 17.0 g/dL   HCT 16.1 09.6 - 04.5 %   MCV 92.7 80.0 - 100.0 fL   MCH 30.4 26.0 - 34.0 pg   MCHC 32.8 30.0 - 36.0 g/dL   RDW 40.9 81.1 - 91.4 %   Platelets 144 (L) 150 - 400 K/uL   nRBC 0.0 0.0 - 0.2 %   Neutrophils Relative % 86 %   Neutro Abs 10.2 (H) 1.7 - 7.7 K/uL   Lymphocytes Relative 4 %   Lymphs Abs 0.5 (L) 0.7 - 4.0 K/uL   Monocytes Relative 9 %   Monocytes Absolute 1.1 (H) 0.1 - 1.0 K/uL   Eosinophils Relative 0 %   Eosinophils Absolute 0.0 0.0 - 0.5 K/uL   Basophils Relative 0 %   Basophils Absolute 0.0 0.0 - 0.1 K/uL   Immature Granulocytes 1 %   Abs Immature Granulocytes 0.09 (H) 0.00 - 0.07 K/uL    Comment: Performed at [facility], [address], [place], [place] [address]  Comprehensive metabolic panel     Status: Abnormal   Collection Time: 10/31/22  3:04 AM  Result Value Ref Range   Sodium 135 135 - 145 mmol/L   Potassium 3.6 3.5 - 5.1 mmol/L   Chloride 105 98 - 111 mmol/L   CO2 19 (L) 22 - 32 mmol/L   Glucose, Bld 143 (H) 70 - 99 mg/dL    Comment: Glucose reference range applies only to samples  taken after fasting for at least 8 hours.   BUN 20 8 - 23 mg/dL   Creatinine, Ser 1.61 0.61 - 1.24 mg/dL   Calcium 8.8 (L) 8.9 - 10.3 mg/dL   Total Protein 6.3 (L) 6.5 - 8.1 g/dL   Albumin 3.4 (L) 3.5 - 5.0 g/dL   AST 26 15 - 41 U/L   ALT 21 0 - 44 U/L   Alkaline Phosphatase 47 38 - 126 U/L   Total Bilirubin 1.8 (H) 0.3 - 1.2 mg/dL   GFR, Estimated >09 >60 mL/min    Comment: (NOTE) Calculated using the CKD-EPI Creatinine Equation (2021)    Anion gap 11 5 - 15    Comment: Performed at [facility], [address] [place], [place] [address]  Urinalysis, w/ Reflex to Culture (Infection Suspected) -Urine, Catheterized; Indwelling urinary catheter     Status: Abnormal   Collection Time: 10/31/22  3:04 AM  Result Value Ref Range   Specimen Source URINE, CLEAN CATCH    Color, Urine YELLOW YELLOW   APPearance CLOUDY (A) CLEAR   Specific Gravity, Urine 1.020 1.005 - 1.030   pH 6.0 5.0 - 8.0   Glucose, UA NEGATIVE NEGATIVE mg/dL   Hgb urine dipstick LARGE (A) NEGATIVE   Bilirubin Urine NEGATIVE NEGATIVE   Ketones, ur NEGATIVE NEGATIVE mg/dL   Protein, ur NEGATIVE NEGATIVE mg/dL   Nitrite POSITIVE (A) NEGATIVE   Leukocytes,Ua LARGE (A) NEGATIVE   Squamous Epithelial / HPF 0-5 0 - 5 /HPF   WBC, UA >50 0 - 5 WBC/hpf    Comment: Reflex urine culture not performed if WBC <=10, OR if Squamous epithelial cells >5. If Squamous epithelial cells >5, suggest recollection.   RBC / HPF 21-50 0 - 5 RBC/hpf   Bacteria, UA MANY (A) NONE SEEN   WBC Clumps PRESENT    Mucus PRESENT     Comment: Performed at [facility], [address], [place], [place] [address]  Protime-INR     Status: Abnormal   Collection Time: 10/31/22  3:04 AM  Result Value Ref Range   Prothrombin Time 15.6 (H) 11.4 - 15.2 seconds   INR 1.2 0.8 - 1.2    Comment: (NOTE) INR goal varies based on device and disease states. Performed at [facility], [address], [place], [place] [address]   Lactic acid, plasma     Status: None   Collection Time: 10/31/22  3:04 AM  Result Value Ref Range   Lactic Acid, Venous 1.7 0.5 - 1.9 mmol/L    Comment: Performed at [facility], [address] [place], [place] [address]  APTT     Status: None   Collection Time: 10/31/22  3:04 AM  Result Value Ref Range   aPTT 33 24 - 36 seconds    Comment: Performed at [facility], [address], [place], [place] [address]   DG Chest Port 1 View  Result Date: 10/31/2022 CLINICAL DATA:  Possible sepsis. EXAM: PORTABLE CHEST 1 VIEW COMPARISON:  05/27/2021. FINDINGS: The heart size and mediastinal contours are within normal limits. There is atherosclerotic calcification of the aorta. The pulmonary vasculature is mildly distended. There are low lung volumes with atelectasis at the lung bases. No effusion or pneumothorax. No acute osseous abnormality. IMPRESSION: 1. Mild pulmonary vascular congestion. 2. Low lung volumes with atelectasis at the lung bases. Electronically Signed   By: Thornell Sartorius M.D.   On: 10/31/2022 03:27  Pending Labs Unresulted Labs (From admission, onward)     Start     Ordered   11/01/22 0500  CBC  Tomorrow morning,   R        10/31/22 0757   11/01/22 0500  Comprehensive metabolic panel  Tomorrow morning,   R        10/31/22 0757   10/31/22 0710  Procalcitonin  Once,   URGENT       References:    Procalcitonin Lower Respiratory Tract Infection AND Sepsis Procalcitonin Algorithm   10/31/22 0709   10/31/22 0304  Urine Culture  Once,   R        10/31/22 0304   10/31/22 0254  Blood Culture (routine x 2)  (Undifferentiated  presentation (screening labs and basic nursing orders))  BLOOD CULTURE X 2,   STAT      10/31/22 0255   10/31/22 0244  Lactic acid, plasma  (Lactic Acid)  Now then every 2 hours,   R (with STAT occurrences)      10/31/22 0243            Vitals/Pain Today's Vitals   10/31/22 0715 10/31/22 0730 10/31/22 0745 10/31/22 0800  BP: 132/85 (!) 142/75 (!) 146/82 (!) 155/88  Pulse: 74 71 76 73  Resp: 20 17 19 17   Temp:      TempSrc:      SpO2: 97% 100% 100% 100%  Weight:      PainSc:        Isolation Precautions No active isolations  Medications Medications  sodium chloride 0.9 % bolus 1,000 mL (0 mLs Intravenous Stopped 10/31/22 0551)    Followed by  0.9 %  sodium chloride infusion (1,000 mLs Intravenous New Bag/Given 10/31/22 0558)  cefTRIAXone (ROCEPHIN) 2 g in sodium chloride 0.9 % 100 mL IVPB (0 g Intravenous Stopped 10/31/22 0501)  atorvastatin (LIPITOR) tablet 20 mg (has no administration in time range)  atenolol (TENORMIN) tablet 25 mg (has no administration in time range)  losartan (COZAAR) tablet 25 mg (has no administration in time range)  tamsulosin (FLOMAX) capsule 0.4 mg (has no administration in time range)  apixaban (ELIQUIS) tablet 5 mg (has no administration in time range)  sodium chloride flush (NS) 0.9 % injection 3 mL (has no administration in time range)  acetaminophen (TYLENOL) tablet 650 mg (has no administration in time range)  ondansetron (ZOFRAN) injection 4 mg (has no administration in time range)    Mobility walks     Focused Assessments     R Recommendations: See Admitting Provider Note  Report given to:   Additional Notes:

## 2022-10-31 NOTE — H&P (Signed)
History and Physical    Patient: William Norman OZD:[id number] DOB: Feb 09, 1941 DOA: 10/31/2022 DOS: the patient was seen and examined on 10/31/2022 PCP: [name], MD  Patient coming from: Home Expected discharge disposition: Home  Chief Complaint:  Chief Complaint  Patient presents with   Urinary Retention   HPI: Guthrie Crisp is a 82 y.o. male with medical history significant of dyslipidemia, hypertension, CAD status post PTCA of RCA and LAD in the past.  Also a history of BPH with chronic urinary retention rerequiring self in and out catheterizations, PAF on apixaban, history of CVA with recurrent CVA after having stopped apixaban in the past, abdominal aortic aneurysm and bilateral common iliac artery ectasia.  Patient presented to the hospital after inability to self cath at home x 48 hours.  Because of his inability to self cath he was having back pain and abdominal distention and discomfort.  He does have a history of Klebsiella oxytoca resistant to ampicillin and Ancef in 2023.  He reports that he has a urologist at the [place] who is his primary urologist and has seen a urologist in [place].  Upon presentation to [facility] ED he was found to have a Tmax 102.6, white count 11,900, lactic acid, creatinine and LFTs were normal.  He was hemodynamically stable without any tachycardia or hypotension.  In addition to the above symptoms patient developed left-sided tremors for 3 days.  Stroke evaluation was negative.  His oxygen was also low at 88% so was placed on 2 L nasal cannula oxygen.  Upon my discussion with the patient his abdominal pain has improved after placement of a Foley catheter.  He continues to take his tamsulosin and other medications as prescribed.  He continues to have a left-sided rolling tremor involving the hand.  He reports the tremors 3 days ago were worse.  He states he can control the tremor.  He reported that he was having difficulty seeing and was unable to  properly insert the urinary catheter.  Subsequently Foley catheter was placed in the ED.  Hospitalist service has been asked to evaluate the patient for admission.  He has been given a dose of Rocephin in the ED.   Review of Systems: As mentioned in the history of present illness. All other systems reviewed and are negative. Past Medical History:  Diagnosis Date   BPH (benign prostatic hypertrophy)    BPH associated with nocturia 08/29/2013   BPH with obstruction/lower urinary tract symptoms 08/14/2019   CAD (coronary artery disease)    CAD (coronary artery disease), native coronary artery    PTCA of RCA 1986, PTCA of LAD 1992,  Negative treadmill Cardiolite 2011    Chronic dermatitis of hands 10/12/2015   Chronic venous insufficiency    Coagulation disorder (HCC) 10/26/2018   Diverticulitis    DVT (deep venous thrombosis) (HCC)    Dyslipidemia    Dysrhythmia    Eczema    Erectile dysfunction 07/11/2012   Essential hypertension    GERD 08/04/2006       GERD (gastroesophageal reflux disease)    Hyperglycemia 07/08/2019   Hyperlipidemia    Hypertension    Incomplete emptying of bladder 08/14/2019   Long term current use of anticoagulant therapy    Lumbar disc disease    Overweight (BMI 25.0-29.9) 06/18/2015   Persistent atrial fibrillation (HCC) 04/10/2018   Personal history of DVT (deep vein thrombosis)    Initially in 1998 with recurrence in 2002 now on chronic warfarin  Soft tissue lesion of foot 08/27/2015   Stroke [facility])    Urinary tract infection with hematuria 08/14/2019   Past Surgical History:  Procedure Laterality Date   APPLICATION OF WOUND VAC  09/02/2020   Procedure: APPLICATION OF WOUND VAC;  Surgeon: Manus Rudd, MD;  Location: [facility] OR;  Service: General;;   CARDIAC CATHETERIZATION     CARPAL TUNNEL RELEASE  04/21/2011   Procedure: CARPAL TUNNEL RELEASE;  Surgeon: Tami Ribas, MD;  Location: [facility];  Service: Orthopedics;  Laterality:  Left;   CARPAL TUNNEL RELEASE  05/23/2011   Procedure: CARPAL TUNNEL RELEASE;  Surgeon: Tami Ribas, MD;  Location: [facility];  Service: Orthopedics;  Laterality: Right;   COLON SURGERY  2010 and 2011 colostomy reversal   CORONARY ANGIOPLASTY     EYE SURGERY     repair of macular hole   LAPAROTOMY N/A 09/02/2020   Procedure: EXPLORATORY LAPAROTOMY, LYSIS OF ADHESIONS ,BOWEL RESECTION;  Surgeon: Manus Rudd, MD;  Location: [facility] OR;  Service: General;  Laterality: N/A;   LUMBAR LAMINECTOMY/DECOMPRESSION MICRODISCECTOMY Left 03/18/2021   Procedure: LAMINOTOMY, FORAMINOTOMY, LEFT LUMBAR THREE-FOUR;  Surgeon: Lisbeth Renshaw, MD;  Location: [facility] OR;  Service: Neurosurgery;  Laterality: Left;   NASAL SEPTUM SURGERY     precutaneous transluminal coronary angioplasty     right hernia     TONSILLECTOMY AND ADENOIDECTOMY     Social History:  reports that he has quit smoking. His smoking use included cigarettes. He has never used smokeless tobacco. He reports that he does not currently use alcohol. He reports that he does not use drugs.  Allergies  Allergen Reactions   Nitrous Oxide Other (See Comments)    Pt reports he "passed out and almost died" after inhaling gas through mask at dentist office    Simvastatin Other (See Comments)    Muscle aches     Family History  Problem Relation Age of Onset   Heart disease Mother    Atrial fibrillation Mother    Stroke Mother    Heart failure Mother    Colitis Mother    Mitral valve prolapse Mother    Heart disease Father    Heart attack Father    Atrial fibrillation Father    Heart failure Father    COPD Father    Varicose Veins Father    Osteoporosis Sister    Irritable bowel syndrome Sister    AAA (abdominal aortic aneurysm) Brother    Diverticulitis Brother     Prior to Admission medications   Medication Sig Start Date End Date Taking? Authorizing Provider  acetaminophen (TYLENOL) 500 MG tablet Take 1,500 mg by mouth  every morning.   Yes [provider]  apixaban (ELIQUIS) 5 MG TABS tablet Take 1 tablet (5 mg total) by mouth 2 (two) times daily. 03/20/21  Yes Lisbeth Renshaw, MD  atenolol (TENORMIN) 25 MG tablet Take 1 tablet (25 mg total) by mouth daily. 06/07/22  Yes Jodelle Red, MD  atorvastatin (LIPITOR) 40 MG tablet TAKE 1/2 TABLET BY MOUTH DAILY 08/29/22  Yes [name], MD  Coenzyme Q10 (CO Q 10 PO) Take 1 capsule by mouth in the morning.   Yes [provider]  Cyanocobalamin (VITAMIN B-12) 1000 MCG SUBL Place 1 tablet (1,000 mcg total) under the tongue daily. 07/24/20  Yes [name], MD  furosemide (LASIX) 20 MG tablet Take 1 tablet (20 mg total) by mouth daily. 06/07/22  Yes Jodelle Red, MD  hydroxypropyl  methylcellulose / hypromellose (ISOPTO TEARS / GONIOVISC) 2.5 % ophthalmic solution Place 1 drop into both eyes in the morning.   Yes [provider]  losartan (COZAAR) 25 MG tablet Take 1 tablet (25 mg total) by mouth daily. 05/17/22  Yes Willow Ora, MD  nitroGLYCERIN (NITROSTAT) 0.4 MG SL tablet Dissolve 1 tablet under the tongue every 5 minutes as needed Patient taking differently: Place 0.4 mg under the tongue every 5 (five) minutes as needed for chest pain. 03/09/21  Yes Baldo Daub, MD  tamsulosin (FLOMAX) 0.4 MG CAPS capsule TAKE 1 CAPSULE BY MOUTH EVERY DAY Patient taking differently: Take 0.4 mg by mouth in the morning and at bedtime. 12/30/21  Yes [name], MD  triamcinolone (NASACORT) 55 MCG/ACT AERO nasal inhaler Place 1 spray into the nose daily. Start with 1 spray each side twice a day for 3 days, then reduce to daily until sinus symptoms are better. 08/24/22  Yes Dulce Sellar, NP    Physical Exam: Vitals:   10/31/22 0233 10/31/22 0250 10/31/22 0545 10/31/22 0600  BP: (!) 145/81  (!) 140/84   Pulse: (!) 116  85   Resp: (!) 24  (!) 24   Temp: 98.4 F (36.9 C) (!) 102.6 F (39.2 C)  97.8 F (36.6 C)   TempSrc: Oral Rectal  Oral  SpO2: 97%  100%   Weight:       Constitutional: NAD, calm, comfortable Respiratory: clear to auscultation bilaterally, no wheezing, no crackles. Normal respiratory effort. No accessory muscle use.  Room air Cardiovascular: Regular rate and rhythm, no murmurs / rubs / gallops. No extremity edema. 2+ pedal pulses.  Abdomen: no tenderness, no masses palpated. No hepatosplenomegaly. Bowel sounds positive.  Genitourinary: Foley catheter in place draining clear yellow urine to bedside bag Musculoskeletal: no clubbing / cyanosis. No joint deformity upper and lower extremities. Good ROM, no contractures. Normal muscle tone.  Skin: no rashes, lesions, ulcers. No induration Neurologic: CN 2-12 grossly intact. Sensation intact, DTR normal. Strength 5/5 x all 4 extremities. -Somewhat drowsy but easily awakened Psychiatric: Normal judgment and insight. Alert and oriented x 3. Normal mood.     Data Reviewed:  Sodium 135, potassium 3.6, CO2 19, glucose 143, BUN 20, creatinine 1.00, total bilirubin 1.8  Lactic acid 1.7, procalcitonin 0.28  WBC 11,900 with left shift, hemoglobin 15, platelets 144,000 with normal coags  Urinalysis cloudy in appearance with large amount of hemoglobin, large leukocytes, positive nitrite, many bacteria, RBCs 21-50, WBC clumps are present and WBCs are greater than 50  Blood cultures and urine culture are pending but have been collected  Portable chest x-ray: Low lung volumes with mild pulmonary congestion bilaterally  Assessment and Plan: SIRS secondary to UTI Patient presented to the hospital after failed attempts at I's/O catheterization for total of 48 hours Presented with fever greater than 101, and source of infection.  Patient otherwise hemodynamically stable with normal lactic acid and no evidence of endorgan dysfunction Patient does have a history of Klebsiella oxytoca in 2023 that was resistant to ampicillin and Ancef but sensitive  to Rocephin therefore we will continue Rocephin IV Follow-up on blood cultures and urine culture Continue IV fluid hydration therefore hold preadmission Lasix-does have mild dehydration with elevated serum CO2  BPH requiring regular I/O catheterizations at home Patient presented with SIRS and urinary tract infection symptoms after inability to cath for 48 hours Appears to be a multifactorial issue with patient reporting could not cath properly due to inability  to see well as well as new onset tremors involving the left hand EDP has placed a Foley catheter.  I discussed with on-call urologist Dr. Chauncy Lean who confirmed the patient has not been seen in their practice since 2009.  He recommended discharging the patient home with Foley catheter in place and the patient needs to follow-up with his primary urologist at the [facility] Resume preadmission Flomax twice daily  Hypertension Continue Cozaar and Tenormin  Atrial fibrillation chronic Continue preadmission beta-blocker as well as Eliquis Follow-up with cardiology in the outpatient setting as recommended  New onset tremors left hand Patient reports symptoms began about 3 days prior to presentation Patient reports able to control/stop tremors which sounds consistent with benign essential tremor.  Denies family history of same. He is currently on low-dose beta-blocker to treat for tension and for rate control with underlying chronic A-fib Patient will need to follow-up with PCP after discharge. Currently neurologically intact and did not meet stroke criteria upon initial EDP evaluation  CAD with prior PTCA Asymptomatic Continue Lipitor and beta-blocker Follow-up with cardiology in the outpatient setting as recommended  Known abdominal aortic aneurysm/bilateral common iliac artery ectasia Outpatient follow-up with vascular team as recommended-last visit was February 2024  History of CVA Continue statin and Eliquis in the context of chronic atrial  fibrillation At one point patient was not taking Eliquis and subsequently did have a small CVA    Advance Care Planning:   Code Status: Full Code   VTE prophylaxis: Eliquis  Consults: Verbal consultation with urologist with above recommendations  Family Communication: Patient only  Severity of Illness: The appropriate patient status for this patient is OBSERVATION. Observation status is judged to be reasonable and necessary in order to provide the required intensity of service to ensure the patient's safety. The patient's presenting symptoms, physical exam findings, and initial radiographic and laboratory data in the context of their medical condition is felt to place them at decreased risk for further clinical deterioration. Furthermore, it is anticipated that the patient will be medically stable for discharge from the hospital within 2 midnights of admission.   Author: Junious Silk, NP 10/31/2022 8:00 AM  For on call review [email or web address].

## 2022-10-31 NOTE — Sepsis Progress Note (Signed)
Elink monitoring for the code sepsis protocol.  

## 2022-11-01 DIAGNOSIS — I714 Abdominal aortic aneurysm, without rupture, unspecified: Secondary | ICD-10-CM | POA: Diagnosis present

## 2022-11-01 DIAGNOSIS — A419 Sepsis, unspecified organism: Secondary | ICD-10-CM

## 2022-11-01 DIAGNOSIS — T83518A Infection and inflammatory reaction due to other urinary catheter, initial encounter: Secondary | ICD-10-CM | POA: Diagnosis present

## 2022-11-01 DIAGNOSIS — Y846 Urinary catheterization as the cause of abnormal reaction of the patient, or of later complication, without mention of misadventure at the time of the procedure: Secondary | ICD-10-CM | POA: Diagnosis present

## 2022-11-01 DIAGNOSIS — I10 Essential (primary) hypertension: Secondary | ICD-10-CM | POA: Diagnosis present

## 2022-11-01 DIAGNOSIS — N390 Urinary tract infection, site not specified: Secondary | ICD-10-CM

## 2022-11-01 DIAGNOSIS — Z86718 Personal history of other venous thrombosis and embolism: Secondary | ICD-10-CM | POA: Diagnosis not present

## 2022-11-01 DIAGNOSIS — R6510 Systemic inflammatory response syndrome (SIRS) of non-infectious origin without acute organ dysfunction: Secondary | ICD-10-CM | POA: Diagnosis not present

## 2022-11-01 DIAGNOSIS — A4159 Other Gram-negative sepsis: Secondary | ICD-10-CM | POA: Diagnosis present

## 2022-11-01 DIAGNOSIS — Z79899 Other long term (current) drug therapy: Secondary | ICD-10-CM | POA: Diagnosis not present

## 2022-11-01 DIAGNOSIS — R6520 Severe sepsis without septic shock: Secondary | ICD-10-CM

## 2022-11-01 DIAGNOSIS — R339 Retention of urine, unspecified: Secondary | ICD-10-CM | POA: Diagnosis present

## 2022-11-01 DIAGNOSIS — Z823 Family history of stroke: Secondary | ICD-10-CM | POA: Diagnosis not present

## 2022-11-01 DIAGNOSIS — Z8673 Personal history of transient ischemic attack (TIA), and cerebral infarction without residual deficits: Secondary | ICD-10-CM | POA: Diagnosis not present

## 2022-11-01 DIAGNOSIS — E860 Dehydration: Secondary | ICD-10-CM | POA: Diagnosis present

## 2022-11-01 DIAGNOSIS — R338 Other retention of urine: Secondary | ICD-10-CM | POA: Diagnosis present

## 2022-11-01 DIAGNOSIS — G250 Essential tremor: Secondary | ICD-10-CM | POA: Diagnosis present

## 2022-11-01 DIAGNOSIS — Z825 Family history of asthma and other chronic lower respiratory diseases: Secondary | ICD-10-CM | POA: Diagnosis not present

## 2022-11-01 DIAGNOSIS — Z87891 Personal history of nicotine dependence: Secondary | ICD-10-CM | POA: Diagnosis not present

## 2022-11-01 DIAGNOSIS — E876 Hypokalemia: Secondary | ICD-10-CM | POA: Diagnosis present

## 2022-11-01 DIAGNOSIS — Z8249 Family history of ischemic heart disease and other diseases of the circulatory system: Secondary | ICD-10-CM | POA: Diagnosis not present

## 2022-11-01 DIAGNOSIS — I2510 Atherosclerotic heart disease of native coronary artery without angina pectoris: Secondary | ICD-10-CM | POA: Diagnosis present

## 2022-11-01 DIAGNOSIS — K219 Gastro-esophageal reflux disease without esophagitis: Secondary | ICD-10-CM | POA: Diagnosis present

## 2022-11-01 DIAGNOSIS — N401 Enlarged prostate with lower urinary tract symptoms: Secondary | ICD-10-CM | POA: Diagnosis present

## 2022-11-01 DIAGNOSIS — E785 Hyperlipidemia, unspecified: Secondary | ICD-10-CM | POA: Diagnosis present

## 2022-11-01 DIAGNOSIS — I4819 Other persistent atrial fibrillation: Secondary | ICD-10-CM | POA: Diagnosis present

## 2022-11-01 DIAGNOSIS — Z9861 Coronary angioplasty status: Secondary | ICD-10-CM | POA: Diagnosis not present

## 2022-11-01 DIAGNOSIS — Z7901 Long term (current) use of anticoagulants: Secondary | ICD-10-CM | POA: Diagnosis not present

## 2022-11-01 LAB — COMPREHENSIVE METABOLIC PANEL
ALT: 21 U/L (ref 0–44)
AST: 26 U/L (ref 15–41)
Albumin: 2.7 g/dL — ABNORMAL LOW (ref 3.5–5.0)
Alkaline Phosphatase: 43 U/L (ref 38–126)
Anion gap: 6 (ref 5–15)
BUN: 9 mg/dL (ref 8–23)
CO2: 22 mmol/L (ref 22–32)
Calcium: 8.3 mg/dL — ABNORMAL LOW (ref 8.9–10.3)
Chloride: 107 mmol/L (ref 98–111)
Creatinine, Ser: 0.78 mg/dL (ref 0.61–1.24)
GFR, Estimated: >60 mL/min (ref >60)
Glucose, Bld: 96 mg/dL (ref 70–99)
Potassium: 3.4 mmol/L — ABNORMAL LOW (ref 3.5–5.1)
Sodium: 135 mmol/L (ref 135–145)
Total Bilirubin: 0.9 mg/dL (ref 0.3–1.2)
Total Protein: 5.6 g/dL — ABNORMAL LOW (ref 6.5–8.1)

## 2022-11-01 LAB — CBC
HCT: 41.9 % (ref 39.0–52.0)
Hemoglobin: 13.9 g/dL (ref 13.0–17.0)
MCH: 30.5 pg (ref 26.0–34.0)
MCHC: 33.2 g/dL (ref 30.0–36.0)
MCV: 91.9 fL (ref 80.0–100.0)
Platelets: 150 10*3/uL (ref 150–400)
RBC: 4.56 MIL/uL (ref 4.22–5.81)
RDW: 15.4 % (ref 11.5–15.5)
WBC: 8.3 10*3/uL (ref 4.0–10.5)
nRBC: 0 % (ref 0.0–0.2)

## 2022-11-01 MED ORDER — HYDRALAZINE HCL 25 MG PO TABS: 25.0000 mg | ORAL_TABLET | Freq: Four times a day (QID) | ORAL | Status: DC | PRN

## 2022-11-01 MED ORDER — SODIUM CHLORIDE 0.9 % IV SOLN
1000.0000 mL | INTRAVENOUS | Status: DC
  Administered 2022-11-01 – 2022-11-02 (×2): 1000 mL via INTRAVENOUS

## 2022-11-01 MED ORDER — POTASSIUM CHLORIDE CRYS ER 20 MEQ PO TBCR
40.0000 meq | EXTENDED_RELEASE_TABLET | Freq: Once | ORAL | Status: AC
  Administered 2022-11-01: 40 meq via ORAL
  Filled 2022-11-01: qty 2

## 2022-11-01 NOTE — Progress Notes (Signed)
[facility]  PROGRESS NOTE  William Norman AOZ:[id number] DOB: Aug 10, 1940 DOA: 10/31/2022 PCP: [name], MD   Brief HPI:   82 year old male with a history of dyslipidemia, hypertension, CAD status post PTCA of right coronary artery, LAD in the past.  Also history of BPH with chronic urinary retention doing self in and out catheterizations at home, PAF on apixaban, history of CVA with recurrent CVA after having stopped apixaban in the past, abdominal aortic aneurysm, bilateral common iliac artery ectasia came to ED after he was unable to do self cath at home for 48 hours.  Patient said that he was having back pain and also had tremors which prevented him from getting self cath. He was found to be in urinary retention In the ED he was febrile with temperature 102.6, abdominal pain improved after he had Foley catheter placed.      Assessment/Plan:   Sepsis due to UTI -Presented with tachycardia, fever and tachypnea; abnormal UA -Started on IV Rocephin, sepsis physiology has resolved -Urine culture growing gram-negative rods; follow urine culture results  Urinary retention/BPH -Has been doing in and out cath at home for past 6 months -Follows urology at [facility] -Discussed with urologist Dr. Annabell Howells, he recommended inserting Foley catheter and follow-up with urology as outpatient -Foley catheter has been inserted -Continue tamsulosin 0.4 mg p.o. twice daily  Hypertension -Continue atenolol, Cozaar  Atrial fibrillation, chronic -Continue atenolol, Eliquis  Benign essential tremor, left hand -He has history of benign essential tremor -Continue atenolol -Follow-up PCP as outpatient  CAD status post PTCA -Continue Lipitor, atenolol  Known abdominal aortic aneurysm/bilateral common iliac artery ectasia -Outpatient follow-up with vascular surgery -Last visit was in February 2024  History of CVA -Continue atorvastatin, Eliquis  Hypokalemia -Potassium is 3.4 -Replace  potassium and follow BMP in am  Medications     apixaban  5 mg Oral BID   atenolol  25 mg Oral Daily   atorvastatin  20 mg Oral Daily   Chlorhexidine Gluconate Cloth  6 each Topical Daily   losartan  25 mg Oral Daily   sodium chloride flush  3 mL Intravenous Q12H   tamsulosin  0.4 mg Oral BID     Data Reviewed:   CBG:  No results for input(s): "GLUCAP" in the last 168 hours.  SpO2: 94 % O2 Flow Rate (L/min): 2 L/min    Vitals:   11/01/22 0104 11/01/22 0556 11/01/22 0813 11/01/22 1523  BP: (!) 152/72 (!) 170/93 (!) 170/98 (!) 142/93  Pulse: 94 88 93 73  Resp: 18 18    Temp: 98.7 F (37.1 C) 98.4 F (36.9 C) 98.9 F (37.2 C) 98.4 F (36.9 C)  TempSrc:      SpO2: 95% 94% 94% 94%  Weight:      Height:          Data Reviewed:  Basic Metabolic Panel: Recent Labs  Lab 10/31/22 0304 11/01/22 0703  NA 135 135  K 3.6 3.4*  CL 105 107  CO2 19* 22  GLUCOSE 143* 96  BUN 20 9  CREATININE 1.00 0.78  CALCIUM 8.8* 8.3*    CBC: Recent Labs  Lab 10/31/22 0304 11/01/22 0703  WBC 11.9* 8.3  NEUTROABS 10.2*  --   HGB 15.0 13.9  HCT 45.8 41.9  MCV 92.7 91.9  PLT 144* 150    LFT Recent Labs  Lab 10/31/22 0304 11/01/22 0703  AST 26 26  ALT 21 21  ALKPHOS 47 43  BILITOT 1.8*  0.9  PROT 6.3* 5.6*  ALBUMIN 3.4* 2.7*     Antibiotics: Anti-infectives (From admission, onward)    Start     Dose/Rate Route Frequency Ordered Stop   10/31/22 0430  cefTRIAXone (ROCEPHIN) 2 g in sodium chloride 0.9 % 100 mL IVPB        2 g 200 mL/hr over 30 Minutes Intravenous Every 24 hours 10/31/22 0420 11/07/22 0429        DVT prophylaxis: Apixaban  Code Status: Full code  Family Communication: No family at bedside   CONSULTS    Subjective   Denies any symptoms; Foley catheter in place   Objective    Physical Examination:   General-appears in no acute distress Heart-S1-S2, regular, no murmur auscultated Lungs-clear to auscultation bilaterally, no  wheezing or crackles auscultated Abdomen-soft, nontender, no organomegaly Extremities-no edema in the lower extremities Neuro-alert, oriented x3, no focal deficit noted   Status is: Inpatient:             Meredeth Ide   [facility] If 7PM-7AM, please contact night-coverage at [email or web address], Office  [phone]   11/01/2022, 3:53 PM  LOS: 0 days

## 2022-11-01 NOTE — Plan of Care (Signed)
Problem: Clinical Measurements: Goal: Will remain free from infection Outcome: Progressing   Problem: Nutrition: Goal: Adequate nutrition will be maintained Outcome: Progressing   Problem: Safety: Goal: Ability to remain free from injury will improve Outcome: Progressing   Problem: Skin Integrity: Goal: Risk for impaired skin integrity will decrease Outcome: Progressing

## 2022-11-02 ENCOUNTER — Other Ambulatory Visit (HOSPITAL_COMMUNITY): Payer: Self-pay

## 2022-11-02 DIAGNOSIS — R6510 Systemic inflammatory response syndrome (SIRS) of non-infectious origin without acute organ dysfunction: Secondary | ICD-10-CM | POA: Diagnosis not present

## 2022-11-02 LAB — URINE CULTURE: Culture: >=100000 — AB

## 2022-11-02 MED ORDER — CIPROFLOXACIN HCL 500 MG PO TABS
500.0000 mg | ORAL_TABLET | Freq: Two times a day (BID) | ORAL | Status: DC
  Administered 2022-11-02: 500 mg via ORAL
  Filled 2022-11-02 (×2): qty 1

## 2022-11-02 MED ORDER — CIPROFLOXACIN HCL 500 MG PO TABS
500.0000 mg | ORAL_TABLET | Freq: Two times a day (BID) | ORAL | 0 refills | Status: AC
  Filled 2022-11-02: qty 10, 5d supply, fill #0

## 2022-11-02 NOTE — Discharge Summary (Signed)
Physician Discharge Summary  William Norman GNF:[id number] DOB: [birth year] DOA: 10/31/2022  PCP: [name], MD  Admit date: 10/31/2022 Discharge date: 11/02/2022  Admitted From: Home Disposition:  Home  Discharge Condition:Stable CODE STATUS:FULL Diet recommendation: Heart Healthy  Brief/Interim Summary: Patient is a 82 year old male with a history of dyslipidemia, hypertension, CAD status post PTCA of right coronary artery, LAD in the past.  Also history of BPH with chronic urinary retention doing self in and out catheterizations at home, PAF on apixaban, history of CVA with recurrent CVA after having stopped apixaban in the past, abdominal aortic aneurysm, bilateral common iliac artery ectasia came to ED after he was unable to do self cath at home for 48 hours.  Patient said that he was having back pain and also had tremors which prevented him from getting self cath. He was found to be in urinary retention In the ED he was febrile with temperature 102.6, abdominal pain improved after he had Foley catheter placed.  Urine culture showed Klebsiella oxytocin, sensitive to ceftriaxone.  Antibiotics changed to oral today.  Sepsis etiology has resolved.  He will be discharged today with oral antibiotics, he will continue Foley catheter on discharge and it he will follow-up with urology as an outpatient.  Following problems were addressed during the hospitalization:  Sepsis due to UTI -Presented with tachycardia, fever and tachypnea; abnormal UA -Urine culture showed Klebsiella oxytocin, antibiotics changed to ciprofloxacin which will be continued for 5 days.   Urinary retention/BPH -Has been doing in and out cath at home for past 6 months -Follows urology at [facility] -Discussed with urologist Dr. Annabell Howells, he recommended inserting Foley catheter and follow-up with urology as outpatient -Foley catheter has been inserted -Continue tamsulosin 0.4 mg p.o. twice daily   Hypertension -Continue  atenolol, Cozaar   Atrial fibrillation, chronic -Continue atenolol, Eliquis   Benign essential tremor, left hand -He has history of benign essential tremor -Continue atenolol -Follow-up PCP as outpatient   CAD status post PTCA -Continue Lipitor, atenolol   Known abdominal aortic aneurysm/bilateral common iliac artery ectasia -Outpatient follow-up with vascular surgery -Last visit was in February 2024   History of CVA -Continue atorvastatin, Eliquis   Hypokalemia -Supplemented and corrected   Discharge Diagnoses:  Principal Problem:   SIRS (systemic inflammatory response syndrome) (HCC) Active Problems:   UTI (urinary tract infection)    Discharge Instructions  Discharge Instructions     Diet - low sodium heart healthy   Complete by: As directed    Discharge instructions   Complete by: As directed    1)Please take prescribed medications as instructed 2)Follow up with your urologist as soon as possible   Increase activity slowly   Complete by: As directed       Allergies as of 11/02/2022       Reactions   Nitrous Oxide Other (See Comments)   Pt reports he "passed out and almost died" after inhaling gas through mask at dentist office    Simvastatin Other (See Comments)   Muscle aches        Medication List     TAKE these medications    acetaminophen 500 MG tablet Commonly known as: TYLENOL Take 1,500 mg by mouth every morning.   apixaban 5 MG Tabs tablet Commonly known as: ELIQUIS Take 1 tablet (5 mg total) by mouth 2 (two) times daily.   atenolol 25 MG tablet Commonly known as: TENORMIN Take 1 tablet (25 mg total) by mouth daily.   atorvastatin  40 MG tablet Commonly known as: LIPITOR TAKE 1/2 TABLET BY MOUTH DAILY   ciprofloxacin 500 MG tablet Commonly known as: CIPRO Take 1 tablet (500 mg total) by mouth 2 (two) times daily for 5 days.   CO Q 10 PO Take 1 capsule by mouth in the morning.   furosemide 20 MG tablet Commonly known as:  LASIX Take 1 tablet (20 mg total) by mouth daily.   hydroxypropyl methylcellulose / hypromellose 2.5 % ophthalmic solution Commonly known as: ISOPTO TEARS / GONIOVISC Place 1 drop into both eyes in the morning.   losartan 25 MG tablet Commonly known as: COZAAR Take 1 tablet (25 mg total) by mouth daily.   nitroGLYCERIN 0.4 MG SL tablet Commonly known as: NITROSTAT Dissolve 1 tablet under the tongue every 5 minutes as needed What changed:  how much to take how to take this when to take this reasons to take this additional instructions   tamsulosin 0.4 MG Caps capsule Commonly known as: FLOMAX TAKE 1 CAPSULE BY MOUTH EVERY DAY What changed: when to take this   triamcinolone 55 MCG/ACT Aero nasal inhaler Commonly known as: NASACORT Place 1 spray into the nose daily. Start with 1 spray each side twice a day for 3 days, then reduce to daily until sinus symptoms are better.   Vitamin B-12 1000 MCG Subl Place 1 tablet (1,000 mcg total) under the tongue daily.        Follow-up Information     [name], MD. Schedule an appointment as soon as possible for a visit in 1 week(s).   Specialty: Family Medicine Contact information: [address] [place] [address] [phone]                Allergies  Allergen Reactions   Nitrous Oxide Other (See Comments)    Pt reports he "passed out and almost died" after inhaling gas through mask at dentist office    Simvastatin Other (See Comments)    Muscle aches     Consultations: None   Procedures/Studies: DG Chest Port 1 View  Result Date: 10/31/2022 CLINICAL DATA:  Possible sepsis. EXAM: PORTABLE CHEST 1 VIEW COMPARISON:  05/27/2021. FINDINGS: The heart size and mediastinal contours are within normal limits. There is atherosclerotic calcification of the aorta. The pulmonary vasculature is mildly distended. There are low lung volumes with atelectasis at the lung bases. No effusion or pneumothorax. No acute  osseous abnormality. IMPRESSION: 1. Mild pulmonary vascular congestion. 2. Low lung volumes with atelectasis at the lung bases. Electronically Signed   By: Thornell Sartorius M.D.   On: 10/31/2022 03:27      Subjective: Patient seen and examined at bedside today.  Hemodynamically stable for discharge.  Afebrile, leukocytosis has resolved.  He feels ready to go home today.I called his wife  Fannie Knee to discuss about dc plan,call not received  Discharge Exam: Vitals:   11/02/22 0456 11/02/22 0815  BP: (!) 167/102 (!) 166/98  Pulse: 91 92  Resp: 18   Temp: 98.4 F (36.9 C) 98.6 F (37 C)  SpO2: 96% 96%   Vitals:   11/01/22 2032 11/02/22 0034 11/02/22 0456 11/02/22 0815  BP: (!) 156/95 (!) 178/99 (!) 167/102 (!) 166/98  Pulse: 86 89 91 92  Resp: 18 18 18    Temp: 98.1 F (36.7 C) 98.5 F (36.9 C) 98.4 F (36.9 C) 98.6 F (37 C)  TempSrc: Oral Oral Oral   SpO2: 96% 97% 96% 96%  Weight:  Height:        General: Pt is alert, awake, not in acute distress Cardiovascular: RRR, S1/S2 +, no rubs, no gallops Respiratory: CTA bilaterally, no wheezing, no rhonchi Abdominal: Soft, NT, ND, bowel sounds + Extremities: no edema, no cyanosis GU: Foley    The results of significant diagnostics from this hospitalization (including imaging, microbiology, ancillary and laboratory) are listed below for reference.     Microbiology: Recent Results (from the past 240 hour(s))  Blood Culture (routine x 2)     Status: None (Preliminary result)   Collection Time: 10/31/22  2:35 AM   Specimen: BLOOD RIGHT HAND  Result Value Ref Range Status   Specimen Description BLOOD RIGHT HAND  Final   Special Requests   Final    BOTTLES DRAWN AEROBIC AND ANAEROBIC Blood Culture adequate volume   Culture   Final    NO GROWTH 2 DAYS Performed at [facility], [address], [place], [place] [address]    Report Status PENDING  Incomplete  Blood Culture (routine x 2)     Status: None (Preliminary result)    Collection Time: 10/31/22  3:03 AM   Specimen: BLOOD LEFT HAND  Result Value Ref Range Status   Specimen Description BLOOD LEFT HAND  Final   Special Requests   Final    BOTTLES DRAWN AEROBIC AND ANAEROBIC Blood Culture results may not be optimal due to an excessive volume of blood received in culture bottles   Culture   Final    NO GROWTH 2 DAYS Performed at [facility], [address], [place], [place] [address]    Report Status PENDING  Incomplete  Urine Culture     Status: Abnormal   Collection Time: 10/31/22  3:04 AM   Specimen: Urine, Random  Result Value Ref Range Status   Specimen Description URINE, RANDOM  Final   Special Requests   Final    NONE Reflexed from [id number] Performed at [facility], [address] [place], [place] [address]    Culture >=100,000 COLONIES/mL KLEBSIELLA OXYTOCA (A)  Final   Report Status 11/02/2022 FINAL  Final   Organism ID, Bacteria KLEBSIELLA OXYTOCA (A)  Final      Susceptibility   Klebsiella oxytoca - MIC*    AMPICILLIN >=32 RESISTANT Resistant     CEFEPIME <=0.12 SENSITIVE Sensitive     CEFTRIAXONE 8 RESISTANT Resistant     CIPROFLOXACIN <=0.25 SENSITIVE Sensitive     GENTAMICIN <=1 SENSITIVE Sensitive     IMIPENEM <=0.25 SENSITIVE Sensitive     NITROFURANTOIN 64 INTERMEDIATE Intermediate     TRIMETH/SULFA <=20 SENSITIVE Sensitive     AMPICILLIN/SULBACTAM >=32 RESISTANT Resistant     PIP/TAZO >=128 RESISTANT Resistant     * >=100,000 COLONIES/mL KLEBSIELLA OXYTOCA     Labs: BNP (last 3 results) No results for input(s): "BNP" in the last 8760 hours. Basic Metabolic Panel: Recent Labs  Lab 10/31/22 0304 11/01/22 0703 11/02/22 0744  NA 135 135 137  K 3.6 3.4* 3.7  CL 105 107 107  CO2 19* 22 22  GLUCOSE 143* 96 88  BUN 20 9 8   CREATININE 1.00 0.78 0.71  CALCIUM 8.8* 8.3* 8.9   Liver Function Tests: Recent Labs  Lab 10/31/22 0304 11/01/22 0703  AST 26 26  ALT 21 21  ALKPHOS 47 43  BILITOT 1.8* 0.9   PROT 6.3* 5.6*  ALBUMIN 3.4* 2.7*   No results for input(s): "LIPASE", "AMYLASE" in the last  168 hours. No results for input(s): "AMMONIA" in the last 168 hours. CBC: Recent Labs  Lab 10/31/22 0304 11/01/22 0703  WBC 11.9* 8.3  NEUTROABS 10.2*  --   HGB 15.0 13.9  HCT 45.8 41.9  MCV 92.7 91.9  PLT 144* 150   Cardiac Enzymes: No results for input(s): "CKTOTAL", "CKMB", "CKMBINDEX", "TROPONINI" in the last 168 hours. BNP: Invalid input(s): "POCBNP" CBG: No results for input(s): "GLUCAP" in the last 168 hours. D-Dimer No results for input(s): "DDIMER" in the last 72 hours. Hgb A1c No results for input(s): "HGBA1C" in the last 72 hours. Lipid Profile No results for input(s): "CHOL", "HDL", "LDLCALC", "TRIG", "CHOLHDL", "LDLDIRECT" in the last 72 hours. Thyroid function studies No results for input(s): "TSH", "T4TOTAL", "T3FREE", "THYROIDAB" in the last 72 hours.  Invalid input(s): "FREET3" Anemia work up No results for input(s): "VITAMINB12", "FOLATE", "FERRITIN", "TIBC", "IRON", "RETICCTPCT" in the last 72 hours. Urinalysis    Component Value Date/Time   COLORURINE YELLOW 10/31/2022 0304   APPEARANCEUR CLOUDY (A) 10/31/2022 0304   LABSPEC 1.020 10/31/2022 0304   PHURINE 6.0 10/31/2022 0304   GLUCOSEU NEGATIVE 10/31/2022 0304   HGBUR LARGE (A) 10/31/2022 0304   HGBUR 1+ 02/13/2007 0000   BILIRUBINUR NEGATIVE 10/31/2022 0304   BILIRUBINUR negative 10/04/2021 1321   KETONESUR NEGATIVE 10/31/2022 0304   PROTEINUR NEGATIVE 10/31/2022 0304   UROBILINOGEN 0.2 10/04/2021 1321   UROBILINOGEN 0.2 03/21/2014 0420   NITRITE POSITIVE (A) 10/31/2022 0304   LEUKOCYTESUR LARGE (A) 10/31/2022 0304   Sepsis Labs Recent Labs  Lab 10/31/22 0304 11/01/22 0703  WBC 11.9* 8.3   Microbiology Recent Results (from the past 240 hour(s))  Blood Culture (routine x 2)     Status: None (Preliminary result)   Collection Time: 10/31/22  2:35 AM   Specimen: BLOOD RIGHT HAND  Result  Value Ref Range Status   Specimen Description BLOOD RIGHT HAND  Final   Special Requests   Final    BOTTLES DRAWN AEROBIC AND ANAEROBIC Blood Culture adequate volume   Culture   Final    NO GROWTH 2 DAYS Performed at [facility], [address], [place], [place] [address]    Report Status PENDING  Incomplete  Blood Culture (routine x 2)     Status: None (Preliminary result)   Collection Time: 10/31/22  3:03 AM   Specimen: BLOOD LEFT HAND  Result Value Ref Range Status   Specimen Description BLOOD LEFT HAND  Final   Special Requests   Final    BOTTLES DRAWN AEROBIC AND ANAEROBIC Blood Culture results may not be optimal due to an excessive volume of blood received in culture bottles   Culture   Final    NO GROWTH 2 DAYS Performed at [facility], [address] [place], [place] [address]    Report Status PENDING  Incomplete  Urine Culture     Status: Abnormal   Collection Time: 10/31/22  3:04 AM   Specimen: Urine, Random  Result Value Ref Range Status   Specimen Description URINE, RANDOM  Final   Special Requests   Final    NONE Reflexed from [id number] Performed at [facility], [address] [place], [place] [address]    Culture >=100,000 COLONIES/mL KLEBSIELLA OXYTOCA (A)  Final   Report Status 11/02/2022 FINAL  Final   Organism ID, Bacteria KLEBSIELLA OXYTOCA (A)  Final      Susceptibility   Klebsiella oxytoca - MIC*    AMPICILLIN >=32  RESISTANT Resistant     CEFEPIME <=0.12 SENSITIVE Sensitive     CEFTRIAXONE 8 RESISTANT Resistant     CIPROFLOXACIN <=0.25 SENSITIVE Sensitive     GENTAMICIN <=1 SENSITIVE Sensitive     IMIPENEM <=0.25 SENSITIVE Sensitive     NITROFURANTOIN 64 INTERMEDIATE Intermediate     TRIMETH/SULFA <=20 SENSITIVE Sensitive     AMPICILLIN/SULBACTAM >=32 RESISTANT Resistant     PIP/TAZO >=128 RESISTANT Resistant     * >=100,000 COLONIES/mL KLEBSIELLA OXYTOCA    Please note: You were cared for by a hospitalist during your hospital  stay. Once you are discharged, your primary care physician will handle any further medical issues. Please note that NO REFILLS for any discharge medications will be authorized once you are discharged, as it is imperative that you return to your primary care physician (or establish a relationship with a primary care physician if you do not have one) for your post hospital discharge needs so that they can reassess your need for medications and monitor your lab values.    Time coordinating discharge: 40 minutes  SIGNED:   Burnadette Pop, MD  [facility] 11/02/2022, 11:33 AM Pager [phone]  If 7PM-7AM, please contact night-coverage [email or web address] Password [email or web address]

## 2022-11-02 NOTE — Plan of Care (Signed)

## 2022-11-03 ENCOUNTER — Telehealth: Payer: Self-pay

## 2022-11-03 NOTE — Transitions of Care (Post Inpatient/ED Visit) (Signed)
11/03/2022  Name: William Norman MRN: [id number] DOB: 05/20/1940  Today's TOC FU Call Status: Today's TOC FU Call Status:: Successful TOC FU Call Completed TOC FU Call Complete Date: 11/03/22 Patient's Name and Date of Birth confirmed.  Transition Care Management Follow-up Telephone Call Date of Discharge: 11/02/22 Discharge Facility: [facility]) Type of Discharge: Inpatient Admission Primary Inpatient Discharge Diagnosis:: urine retention How have you been since you were released from the hospital?: Same Any questions or concerns?: No  Items Reviewed: Did you receive and understand the discharge instructions provided?: Yes Medications obtained,verified, and reconciled?: Yes (Medications Reviewed) Any new allergies since your discharge?: No Dietary orders reviewed?: Yes Do you have support at home?: Yes People in Home: spouse  Medications Reviewed Today: Medications Reviewed Today     Reviewed by Karena Addison, LPN (Licensed Practical Nurse) on 11/03/22 at 1036  Med List Status: <None>   Medication Order Taking? Sig Documenting Provider Last Dose Status Informant  acetaminophen (TYLENOL) 500 MG tablet [id number] No Take 1,500 mg by mouth every morning. [provider] 10/30/2022 Active Self           Med Note (WHITE, Elvin So   Mon Oct 31, 2022  4:00 AM)    apixaban (ELIQUIS) 5 MG TABS tablet [id number] No Take 1 tablet (5 mg total) by mouth 2 (two) times daily. Lisbeth Renshaw, MD 10/30/2022 1000 Active Self           Med Note (WHITE, TONIA S   Mon Oct 31, 2022  4:00 AM)    atenolol (TENORMIN) 25 MG tablet [id number] No Take 1 tablet (25 mg total) by mouth daily. Jodelle Red, MD 10/30/2022 am Active   atorvastatin (LIPITOR) 40 MG tablet [id number] No TAKE 1/2 TABLET BY MOUTH DAILY [name], MD 10/29/2022 pm Active   ciprofloxacin (CIPRO) 500 MG tablet [id number]  Take 1 tablet (500 mg total) by mouth 2 (two) times daily for 5 days.  Burnadette Pop, MD  Active   Coenzyme Q10 (CO Q 10 PO) [id number] No Take 1 capsule by mouth in the morning. [provider] 10/29/2022 pm Active Self  Cyanocobalamin (VITAMIN B-12) 1000 MCG SUBL [id number] No Place 1 tablet (1,000 mcg total) under the tongue daily. [name], MD 10/30/2022 am Active Self  furosemide (LASIX) 20 MG tablet [id number] No Take 1 tablet (20 mg total) by mouth daily. Jodelle Red, MD 10/30/2022 am Active   hydroxypropyl methylcellulose / hypromellose (ISOPTO TEARS / GONIOVISC) 2.5 % ophthalmic solution [id number] No Place 1 drop into both eyes in the morning. [provider] 10/30/2022 am Active Self  losartan (COZAAR) 25 MG tablet [id number] No Take 1 tablet (25 mg total) by mouth daily. Willow Ora, MD 10/29/2022 pm Active   nitroGLYCERIN (NITROSTAT) 0.4 MG SL tablet [id number] No Dissolve 1 tablet under the tongue every 5 minutes as needed  Patient taking differently: Place 0.4 mg under the tongue every 5 (five) minutes as needed for chest pain.   Baldo Daub, MD UNKNOWN Active Self  tamsulosin (FLOMAX) 0.4 MG CAPS capsule [id number] No TAKE 1 CAPSULE BY MOUTH EVERY DAY  Patient taking differently: Take 0.4 mg by mouth in the morning and at bedtime.   [name], MD 10/30/2022 am Active   triamcinolone (NASACORT) 55 MCG/ACT AERO nasal inhaler [id number] No Place 1 spray into the nose daily. Start with 1 spray each side twice a day for 3 days, then reduce to daily until sinus symptoms are  better. Dulce Sellar, NP 10/29/2022 pm Active             Home Care and Equipment/Supplies: Were Home Health Services Ordered?: NA Any new equipment or medical supplies ordered?: NA  Functional Questionnaire: Do you need assistance with bathing/showering or dressing?: No Do you need assistance with meal preparation?: No Do you need assistance with eating?: No Do you have difficulty maintaining continence: No Do you need assistance with  getting out of bed/getting out of a chair/moving?: No Do you have difficulty managing or taking your medications?: No  Follow up appointments reviewed: PCP Follow-up appointment confirmed?: No (declined) MD Provider Line Number:[phone] Given: No Specialist Hospital Follow-up appointment confirmed?: Yes Date of Specialist follow-up appointment?: 11/03/22 Follow-Up Specialty Provider:: [facility] Do you need transportation to your follow-up appointment?: No Do you understand care options if your condition(s) worsen?: Yes-patient verbalized understanding    SIGNATURE Karena Addison, LPN [facility] Nurse Health Advisor Direct Dial [phone]

## 2022-11-03 NOTE — Progress Notes (Signed)
Pt discharged to home in stable condition, all discharge instructions and medications reviewed with and given to pt. Pt educated with teach back on foley and leg bag care. Pt gives complete verbal understanding of all of the above. Pt transported via wheelchair with staff x1 at chairside.

## 2022-11-04 ENCOUNTER — Ambulatory Visit: Payer: Medicare Other | Admitting: Internal Medicine

## 2022-11-04 ENCOUNTER — Emergency Department (HOSPITAL_BASED_OUTPATIENT_CLINIC_OR_DEPARTMENT_OTHER)
Admission: EM | Admit: 2022-11-04 | Discharge: 2022-11-04 | Disposition: A | Discharge status: Home / Self Care | Payer: Medicare Other | Attending: Emergency Medicine | Admitting: Emergency Medicine

## 2022-11-04 ENCOUNTER — Other Ambulatory Visit: Payer: Self-pay

## 2022-11-04 ENCOUNTER — Encounter: Payer: Self-pay | Admitting: Internal Medicine

## 2022-11-04 ENCOUNTER — Emergency Department (HOSPITAL_BASED_OUTPATIENT_CLINIC_OR_DEPARTMENT_OTHER): Payer: Medicare Other

## 2022-11-04 ENCOUNTER — Encounter (HOSPITAL_BASED_OUTPATIENT_CLINIC_OR_DEPARTMENT_OTHER): Payer: Self-pay | Admitting: Emergency Medicine

## 2022-11-04 VITALS — BP 124/72 | HR 73 | Temp 98.0°F | Resp 18 | Ht 72.0 in | Wt 220.4 lb

## 2022-11-04 DIAGNOSIS — T839XXA Unspecified complication of genitourinary prosthetic device, implant and graft, initial encounter: Secondary | ICD-10-CM

## 2022-11-04 DIAGNOSIS — N5089 Other specified disorders of the male genital organs: Secondary | ICD-10-CM | POA: Diagnosis not present

## 2022-11-04 DIAGNOSIS — Z7901 Long term (current) use of anticoagulants: Secondary | ICD-10-CM | POA: Diagnosis not present

## 2022-11-04 DIAGNOSIS — T80211S Bloodstream infection due to central venous catheter, sequela: Secondary | ICD-10-CM

## 2022-11-04 DIAGNOSIS — N50812 Left testicular pain: Secondary | ICD-10-CM | POA: Diagnosis not present

## 2022-11-04 DIAGNOSIS — N5082 Scrotal pain: Secondary | ICD-10-CM | POA: Diagnosis not present

## 2022-11-04 DIAGNOSIS — I2510 Atherosclerotic heart disease of native coronary artery without angina pectoris: Secondary | ICD-10-CM | POA: Insufficient documentation

## 2022-11-04 DIAGNOSIS — N492 Inflammatory disorders of scrotum: Secondary | ICD-10-CM | POA: Diagnosis not present

## 2022-11-04 NOTE — Progress Notes (Signed)
[facility]: [phone]   Transitional Care Management Visit  Patient:  William Norman      Age: 82 y.o.       Sex:  male  Date:   11/04/2022 Patient Care Team: [name], MD as PCP - General (Family Medicine) Jodelle Red, MD as PCP - Cardiology (Cardiology) Othella Boyer, MD as Consulting Physician (Cardiology) Jethro Bolus, MD as Consulting Physician (Ophthalmology) Dulce Sellar Iline Oven, MD as Consulting Physician (Cardiology) Felecia Shelling, DPM as Consulting Physician (Podiatry) Sedalia Muta, PT as Physical Therapist (Physical Therapy) Erroll Luna, [facility] (Inactive) as Pharmacist (Pharmacist) Lisbeth Renshaw, MD as Consulting Physician (Neurosurgery) Today's Healthcare Provider: Lula Olszewski, MD    Chief Complaint / Reason For Visit  Transitional Care Management [facility]) Hospital Follow Up Visit.   He was hospitalized from 9/16 - 9/18   Assessment and Plan:    Problem List Items Addressed This Visit   None  Assessment and Plan    Testicular Swelling He presents with acute onset of left testicular swelling and pain following a hospitalization for a urinary tract infection, raising concerns for testicular torsion due to the severity of pain and sensitivity. We will urgently refer him to the emergency department for an immediate evaluation and ultrasound of the testicle to rule out testicular torsion.  Urinary Tract Infection Following a recent hospitalization for a urinary tract infection, he developed hematuria and is currently on an indwelling catheter. We will continue the current antibiotic treatment as prescribed in the hospital and follow up with a urologist in two weeks or sooner if symptoms worsen.  Reviewed discharge summary: "-Presented with tachycardia, fever and tachypnea; abnormal UA -Urine culture showed Klebsiella oxytocin, antibiotics changed to ciprofloxacin which will be continued for 5 days."  If  the pain is epididymitis and not torsion this will need to be addressed at the emergency room after evaluation.  Hypertension He has a history of hypertension, with recent elevated blood pressure readings noted during his hospital stay. We will continue the current antihypertensive regimen, Apixaban, and monitor blood pressure regularly.        Clinical Presentation:     HPI   TOC FU Call Complete Date: 11/03/22 Date of Discharge: 11/02/22 Discharge Facility: [facility]) Type of Discharge: Inpatient Admission Primary Inpatient Discharge Diagnosis:: urine retention  Today he presents because of hospital follow up, new unexpected testicular swelling after permacath for hematuria from foley associated hematuria.   The blood in the urine mostly seems to have cleared up.  He denies any fever   History of Present Illness   Mr. Ferrebee, a patient with a history of urinary tract issues requiring self-catheterization, presented with a chief complaint of significant left testicular swelling and pain. The patient reported that the swelling developed after a recent hospital stay for a urinary tract infection, which was severe enough to be close to sepsis. The patient also reported difficulty with self-catheterization due to penile retraction and shaking, as well as balance issues and severe lower back pain.  During the hospital stay, the patient had a permanent catheter placed and experienced episodes of bloody urine, which resolved before discharge. However, the patient reported a sensation of needing to urinate despite the presence of the catheter.  The patient's left testicular swelling and pain developed after discharge from the hospital. The testicle was reported to be five times its normal size and was extremely sensitive to touch. The patient also reported a previous episode of  testicular swelling that resolved on its own.  The patient's other medical history includes heart problems,  stroke, and a history of being on blood thinners. The patient reported no new symptoms since leaving the hospital and no difficulty with self-care at home. However, the patient reported a decrease in appetite and a change in dietary habits.  The patient lives at home with his wife and adult daughter. The patient was not set up for a follow-up with his usual urologist at the [facility] in [name], but expressed a desire to establish care with a local urologist. The patient reported no fever, chills, dizziness, or fainting since leaving the hospital.        Medications were reviewed with patient. He still taking eliquis and no blood in urine Current Outpatient Medications  Medication Sig Dispense Refill   acetaminophen (TYLENOL) 500 MG tablet Take 1,500 mg by mouth every morning.     apixaban (ELIQUIS) 5 MG TABS tablet Take 1 tablet (5 mg total) by mouth 2 (two) times daily. 60 tablet 11   atenolol (TENORMIN) 25 MG tablet Take 1 tablet (25 mg total) by mouth daily. 90 tablet 3   atorvastatin (LIPITOR) 40 MG tablet TAKE 1/2 TABLET BY MOUTH DAILY 45 tablet 3   ciprofloxacin (CIPRO) 500 MG tablet Take 1 tablet (500 mg total) by mouth 2 (two) times daily for 5 days. 10 tablet 0   Coenzyme Q10 (CO Q 10 PO) Take 1 capsule by mouth in the morning.     Cyanocobalamin (VITAMIN B-12) 1000 MCG SUBL Place 1 tablet (1,000 mcg total) under the tongue daily. 90 tablet 0   finasteride (PROSCAR) 5 MG tablet Take by mouth.     furosemide (LASIX) 20 MG tablet Take 1 tablet (20 mg total) by mouth daily. 90 tablet 3   hydroxypropyl methylcellulose / hypromellose (ISOPTO TEARS / GONIOVISC) 2.5 % ophthalmic solution Place 1 drop into both eyes in the morning.     losartan (COZAAR) 25 MG tablet Take 1 tablet (25 mg total) by mouth daily. 90 tablet 3   nitroGLYCERIN (NITROSTAT) 0.4 MG SL tablet Dissolve 1 tablet under the tongue every 5 minutes as needed (Patient taking differently: Place 0.4 mg under the tongue every 5  (five) minutes as needed for chest pain.) 90 tablet 1   tamsulosin (FLOMAX) 0.4 MG CAPS capsule TAKE 1 CAPSULE BY MOUTH EVERY DAY (Patient taking differently: Take 0.4 mg by mouth in the morning and at bedtime.) 90 capsule 1   triamcinolone (NASACORT) 55 MCG/ACT AERO nasal inhaler Place 1 spray into the nose daily. Start with 1 spray each side twice a day for 3 days, then reduce to daily until sinus symptoms are better. 1 each 2   No current facility-administered medications for this visit.     We updated problem list overviews for longitudinal management and care coordination: No problems updated.  In my medical opinion, he does have a good understanding of his diagnoses and medical plans from the hospital, and his discharge instructions  Since leaving the hospital, he reports:  Had TCM phone call from our team to arrange this visit on 11/03/22 He has been staying at/with home with wife and daughter.  he has not been eating well. But he is eating some He has done well with regards to Activities of daily living and IADL independence He  reports he has NOT been requiring assistance with bathing and showering He  reports he has NOT been requiring assistance with personal hygiene and  grooming He  reports he has NOT been requiring assistance with dressing He  reports he has NOT been requiring assistance with toilet hygiene He  reports he has NOT been requiring assistance with functional mobility / transferring and walking He  reports he has NOT been requiring assistance with self - feeding He  reports he has NOT have any issues or assistance needs for transportation  Following with [facility] for urology so didn't accept offered urology referral from hospital, but [facility].  Current Providers as of 11/04/2022 PCP: [name], MD Care Team Provider: Othella Boyer, MD Care Team Provider: Jethro Bolus, MD Care Team  Provider: Baldo Daub, MD Care Team Provider: Felecia Shelling, DPM Care Team Provider: Sedalia Muta, PT Care Team Provider: Erroll Luna, [facility] Care Team Provider: Lisbeth Renshaw, MD Care Team Provider: Jodelle Red, MD Encounter Provider: Lula Olszewski, MD, starting on Fri Nov 04, 2022 12:00 AM Referring Provider: [name], MD, starting on Fri Nov 04, 2022 12:00 AM Attending Provider: Lula Olszewski, MD, starting on Thu Nov 03, 2022  3:56 PM (Active) Consulting Physician: Lula Olszewski, MD, starting on Fri Nov 04, 2022 11:21 AM (Active)  Future Appointments  Date Time Provider Department Center  01/18/2023  3:15 PM Felecia Shelling, DPM [facility]    A focused Review of Systems  Constitutional:  Negative for chills, fever and malaise/fatigue.  Genitourinary:  Positive for hematuria. Negative for dysuria, flank pain, frequency and urgency.  Neurological:  Negative for dizziness and weakness.   was collected and the main issue is testicular tenderness/swelling.     Clinical Data:   Physical Exam  BP 124/72   Pulse 73   Temp 98 F (36.7 C) (Temporal)   Resp 18   Ht 6' (1.829 m)   Wt 220 lb 6 oz (100 kg)   SpO2 94%   BMI 29.89 kg/m  Wt Readings from Last 10 Encounters:  11/04/22 220 lb 6 oz (100 kg)  10/31/22 216 lb 14.9 oz (98.4 kg)  09/27/22 219 lb 3.2 oz (99.4 kg)  09/07/22 222 lb (100.7 kg)  09/06/22 224 lb 3.2 oz (101.7 kg)  08/24/22 224 lb 9.6 oz (101.9 kg)  08/15/22 224 lb 3.2 oz (101.7 kg)  04/08/22 224 lb 12.8 oz (102 kg)  03/29/22 225 lb 11.2 oz (102.4 kg)  03/10/22 225 lb 3.2 oz (102.2 kg)   Vital signs reviewed.  Nursing notes reviewed. Weight trend reviewed. Abnormalities and Problem-Specific physical exam findings:  testicular swelling, permacath, penis retracted so that urethra cannot be seen.  Testicles swollen and exquisitely tender to palpation of left>>>right scrotum, limited exam due to severe exquisite  tenderness. General Appearance:  No acute distress appreciable.   Well-groomed, healthy-appearing male.  Well proportioned with no abnormal fat distribution.  Good muscle tone. Skin: Clear and well-hydrated. Pulmonary:  Normal work of breathing at rest, no respiratory distress apparent. SpO2: 94 %  Musculoskeletal: All extremities are intact.  Neurological:  Awake, alert, oriented, and engaged.  No obvious focal neurological deficits or cognitive impairments-patient is a fairly good historian.  Sensorium seems unclouded.   Speech is clear and coherent with logical content. Psychiatric:  Appropriate mood, pleasant and cooperative demeanor, thoughtful and engaged during the exam.    Results Reviewed:       No results found for any visits on 11/04/22.  No images are attached to the encounter or orders  placed in the encounter.  No images are attached to the encounter.  Recent Results (from the past 2160 hour(s))  CBC     Status: None   Collection Time: 09/06/22  2:32 PM  Result Value Ref Range   WBC 7.1 4.0 - 10.5 K/uL   RBC 4.84 4.22 - 5.81 Mil/uL   Platelets 243.0 150.0 - 400.0 K/uL   Hemoglobin 14.2 13.0 - 17.0 g/dL   HCT 36.6 44.0 - 34.7 %   MCV 90.7 78.0 - 100.0 fl   MCHC 32.2 30.0 - 36.0 g/dL   RDW 42.5 95.6 - 38.7 %  Comprehensive metabolic panel     Status: None   Collection Time: 09/06/22  2:32 PM  Result Value Ref Range   Sodium 138 135 - 145 mEq/L   Potassium 4.3 3.5 - 5.1 mEq/L   Chloride 105 96 - 112 mEq/L   CO2 28 19 - 32 mEq/L   Glucose, Bld 95 70 - 99 mg/dL   BUN 16 6 - 23 mg/dL   Creatinine, Ser 5.64 0.40 - 1.50 mg/dL   Total Bilirubin 0.9 0.2 - 1.2 mg/dL   Alkaline Phosphatase 54 39 - 117 U/L   AST 22 0 - 37 U/L   ALT 16 0 - 53 U/L   Total Protein 6.9 6.0 - 8.3 g/dL   Albumin 3.9 3.5 - 5.2 g/dL   GFR 33.29 >51.88 mL/min    Comment: Calculated using the CKD-EPI Creatinine Equation (2021)   Calcium 9.8 8.4 - 10.5 mg/dL  Hemoglobin C1Y     Status: None    Collection Time: 09/06/22  2:32 PM  Result Value Ref Range   Hgb A1c MFr Bld 5.9 4.6 - 6.5 %    Comment: Glycemic Control Guidelines for People with Diabetes:Non Diabetic:  <6%Goal of Therapy: <7%Additional Action Suggested:  >8%   TSH     Status: None   Collection Time: 09/06/22  2:32 PM  Result Value Ref Range   TSH 2.43 0.35 - 5.50 uIU/mL  Lipid panel     Status: None   Collection Time: 09/06/22  2:32 PM  Result Value Ref Range   Cholesterol 119 0 - 200 mg/dL    Comment: ATP III Classification       Desirable:  < 200 mg/dL               Borderline High:  200 - 239 mg/dL          High:  > = 606 mg/dL   Triglycerides 30.1 0.0 - 149.0 mg/dL    Comment: Normal:  <601 mg/dLBorderline High:  150 - 199 mg/dL   HDL 09.32 >35.57 mg/dL   VLDL 32.2 0.0 - 02.5 mg/dL   LDL Cholesterol 58 0 - 99 mg/dL   Total CHOL/HDL Ratio 3     Comment:                Men          Women1/2 Average Risk     3.4          3.3Average Risk          5.0          4.42X Average Risk          9.6          7.13X Average Risk          15.0          11.0  NonHDL 75.51     Comment: NOTE:  Non-HDL goal should be 30 mg/dL higher than patient's LDL goal (i.e. LDL goal of < 70 mg/dL, would have non-HDL goal of < 100 mg/dL)  Blood Culture (routine x 2)     Status: None (Preliminary result)   Collection Time: 10/31/22  2:35 AM   Specimen: BLOOD RIGHT HAND  Result Value Ref Range   Specimen Description BLOOD RIGHT HAND    Special Requests      BOTTLES DRAWN AEROBIC AND ANAEROBIC Blood Culture adequate volume   Culture      NO GROWTH 4 DAYS Performed at [facility], [address] [place], [place] [address]    Report Status PENDING   Blood Culture (routine x 2)     Status: None (Preliminary result)   Collection Time: 10/31/22  3:03 AM   Specimen: BLOOD LEFT HAND  Result Value Ref Range   Specimen Description BLOOD LEFT HAND    Special Requests      BOTTLES DRAWN AEROBIC AND ANAEROBIC Blood  Culture results may not be optimal due to an excessive volume of blood received in culture bottles   Culture      NO GROWTH 4 DAYS Performed at [facility], [address], [place], [place] [address]    Report Status PENDING   CBC with Differential     Status: Abnormal   Collection Time: 10/31/22  3:04 AM  Result Value Ref Range   WBC 11.9 (H) 4.0 - 10.5 K/uL   RBC 4.94 4.22 - 5.81 MIL/uL   Hemoglobin 15.0 13.0 - 17.0 g/dL   HCT 09.8 11.9 - 14.7 %   MCV 92.7 80.0 - 100.0 fL   MCH 30.4 26.0 - 34.0 pg   MCHC 32.8 30.0 - 36.0 g/dL   RDW 82.9 56.2 - 13.0 %   Platelets 144 (L) 150 - 400 K/uL   nRBC 0.0 0.0 - 0.2 %   Neutrophils Relative % 86 %   Neutro Abs 10.2 (H) 1.7 - 7.7 K/uL   Lymphocytes Relative 4 %   Lymphs Abs 0.5 (L) 0.7 - 4.0 K/uL   Monocytes Relative 9 %   Monocytes Absolute 1.1 (H) 0.1 - 1.0 K/uL   Eosinophils Relative 0 %   Eosinophils Absolute 0.0 0.0 - 0.5 K/uL   Basophils Relative 0 %   Basophils Absolute 0.0 0.0 - 0.1 K/uL   Immature Granulocytes 1 %   Abs Immature Granulocytes 0.09 (H) 0.00 - 0.07 K/uL    Comment: Performed at [facility], [address], [place], [place] [address]  Comprehensive metabolic panel     Status: Abnormal   Collection Time: 10/31/22  3:04 AM  Result Value Ref Range   Sodium 135 135 - 145 mmol/L   Potassium 3.6 3.5 - 5.1 mmol/L   Chloride 105 98 - 111 mmol/L   CO2 19 (L) 22 - 32 mmol/L   Glucose, Bld 143 (H) 70 - 99 mg/dL    Comment: Glucose reference range applies only to samples taken after fasting for at least 8 hours.   BUN 20 8 - 23 mg/dL   Creatinine, Ser 4.69 0.61 - 1.24 mg/dL   Calcium 8.8 (L) 8.9 - 10.3 mg/dL   Total Protein 6.3 (L) 6.5 - 8.1 g/dL   Albumin 3.4 (L) 3.5 - 5.0 g/dL   AST 26 15 - 41 U/L   ALT 21 0 - 44 U/L   Alkaline Phosphatase  47 38 - 126 U/L   Total Bilirubin 1.8 (H) 0.3 - 1.2 mg/dL   GFR, Estimated >32 >44 mL/min    Comment: (NOTE) Calculated using the CKD-EPI Creatinine Equation  (2021)    Anion gap 11 5 - 15    Comment: Performed at [facility], [address], [place], [place] [address]  Urinalysis, w/ Reflex to Culture (Infection Suspected) -Urine, Catheterized; Indwelling urinary catheter     Status: Abnormal   Collection Time: 10/31/22  3:04 AM  Result Value Ref Range   Specimen Source URINE, CLEAN CATCH    Color, Urine YELLOW YELLOW   APPearance CLOUDY (A) CLEAR   Specific Gravity, Urine 1.020 1.005 - 1.030   pH 6.0 5.0 - 8.0   Glucose, UA NEGATIVE NEGATIVE mg/dL   Hgb urine dipstick LARGE (A) NEGATIVE   Bilirubin Urine NEGATIVE NEGATIVE   Ketones, ur NEGATIVE NEGATIVE mg/dL   Protein, ur NEGATIVE NEGATIVE mg/dL   Nitrite POSITIVE (A) NEGATIVE   Leukocytes,Ua LARGE (A) NEGATIVE   Squamous Epithelial / HPF 0-5 0 - 5 /HPF   WBC, UA >50 0 - 5 WBC/hpf    Comment: Reflex urine culture not performed if WBC <=10, OR if Squamous epithelial cells >5. If Squamous epithelial cells >5, suggest recollection.   RBC / HPF 21-50 0 - 5 RBC/hpf   Bacteria, UA MANY (A) NONE SEEN   WBC Clumps PRESENT    Mucus PRESENT     Comment: Performed at [facility], [address], [place], [place] [address]  Protime-INR     Status: Abnormal   Collection Time: 10/31/22  3:04 AM  Result Value Ref Range   Prothrombin Time 15.6 (H) 11.4 - 15.2 seconds   INR 1.2 0.8 - 1.2    Comment: (NOTE) INR goal varies based on device and disease states. Performed at [facility], [address] [place], [place] [address]   Lactic acid, plasma     Status: None   Collection Time: 10/31/22  3:04 AM  Result Value Ref Range   Lactic Acid, Venous 1.7 0.5 - 1.9 mmol/L    Comment: Performed at [facility], [address] [place], [place] [address]  APTT     Status: None   Collection Time: 10/31/22  3:04 AM  Result Value Ref Range   aPTT 33 24 - 36 seconds    Comment: Performed at [facility], [address] [place], [place] [address]  Urine Culture      Status: Abnormal   Collection Time: 10/31/22  3:04 AM   Specimen: Urine, Random  Result Value Ref Range   Specimen Description URINE, RANDOM    Special Requests      NONE Reflexed from [id number] Performed at [facility], [address], [place], [place] [address]    Culture >=100,000 COLONIES/mL KLEBSIELLA OXYTOCA (A)    Report Status 11/02/2022 FINAL    Organism ID, Bacteria KLEBSIELLA OXYTOCA (A)       Susceptibility   Klebsiella oxytoca - MIC*    AMPICILLIN >=32 RESISTANT Resistant     CEFEPIME <=0.12 SENSITIVE Sensitive     CEFTRIAXONE 8 RESISTANT Resistant     CIPROFLOXACIN <=0.25 SENSITIVE Sensitive     GENTAMICIN <=1 SENSITIVE Sensitive     IMIPENEM <=0.25 SENSITIVE Sensitive     NITROFURANTOIN 64 INTERMEDIATE Intermediate     TRIMETH/SULFA <=20 SENSITIVE Sensitive     AMPICILLIN/SULBACTAM >=32 RESISTANT Resistant  PIP/TAZO >=128 RESISTANT Resistant     * >=100,000 COLONIES/mL KLEBSIELLA OXYTOCA  Procalcitonin     Status: None   Collection Time: 10/31/22  7:30 AM  Result Value Ref Range   Procalcitonin 0.28 ng/mL    Comment:        Interpretation: PCT (Procalcitonin) <= 0.5 ng/mL: Systemic infection (sepsis) is not likely. Local bacterial infection is possible. (NOTE)       Sepsis PCT Algorithm           Lower Respiratory Tract                                      Infection PCT Algorithm    ----------------------------     ----------------------------         PCT < 0.25 ng/mL                PCT < 0.10 ng/mL          Strongly encourage             Strongly discourage   discontinuation of antibiotics    initiation of antibiotics    ----------------------------     -----------------------------       PCT 0.25 - 0.50 ng/mL            PCT 0.10 - 0.25 ng/mL               OR       >80% decrease in PCT            Discourage initiation of                                            antibiotics      Encourage discontinuation           of antibiotics     ----------------------------     -----------------------------         PCT >= 0.50 ng/mL              PCT 0.26 - 0.50 ng/mL               AND        <80% decrease in PCT             Encourage initiation of                                             antibiotics       Encourage continuation           of antibiotics    ----------------------------     -----------------------------        PCT >= 0.50 ng/mL                  PCT > 0.50 ng/mL               AND         increase in PCT                  Strongly encourage  initiation of antibiotics    Strongly encourage escalation           of antibiotics                                     -----------------------------                                           PCT <= 0.25 ng/mL                                                 OR                                        > 80% decrease in PCT                                      Discontinue / Do not initiate                                             antibiotics  Performed at [facility], [address], [place], [place] [address]   CBC     Status: None   Collection Time: 11/01/22  7:03 AM  Result Value Ref Range   WBC 8.3 4.0 - 10.5 K/uL   RBC 4.56 4.22 - 5.81 MIL/uL   Hemoglobin 13.9 13.0 - 17.0 g/dL   HCT 03.4 74.2 - 59.5 %   MCV 91.9 80.0 - 100.0 fL   MCH 30.5 26.0 - 34.0 pg   MCHC 33.2 30.0 - 36.0 g/dL   RDW 63.8 75.6 - 43.3 %   Platelets 150 150 - 400 K/uL   nRBC 0.0 0.0 - 0.2 %    Comment: Performed at [facility], [address], [place], [place] [address]  Comprehensive metabolic panel     Status: Abnormal   Collection Time: 11/01/22  7:03 AM  Result Value Ref Range   Sodium 135 135 - 145 mmol/L   Potassium 3.4 (L) 3.5 - 5.1 mmol/L   Chloride 107 98 - 111 mmol/L   CO2 22 22 - 32 mmol/L   Glucose, Bld 96 70 - 99 mg/dL    Comment: Glucose reference range applies only to samples taken after fasting for at least 8 hours.   BUN  9 8 - 23 mg/dL   Creatinine, Ser 8.41 0.61 - 1.24 mg/dL   Calcium 8.3 (L) 8.9 - 10.3 mg/dL   Total Protein 5.6 (L) 6.5 - 8.1 g/dL   Albumin 2.7 (L) 3.5 - 5.0 g/dL   AST 26 15 - 41 U/L   ALT 21 0 - 44 U/L   Alkaline Phosphatase 43 38 - 126 U/L   Total Bilirubin 0.9 0.3 - 1.2 mg/dL   GFR, Estimated >66 >06 mL/min    Comment: (NOTE) Calculated using the CKD-EPI Creatinine Equation (2021)  Anion gap 6 5 - 15    Comment: Performed at [facility], [address], [place], [place] [address]  Basic metabolic panel     Status: None   Collection Time: 11/02/22  7:44 AM  Result Value Ref Range   Sodium 137 135 - 145 mmol/L   Potassium 3.7 3.5 - 5.1 mmol/L   Chloride 107 98 - 111 mmol/L   CO2 22 22 - 32 mmol/L   Glucose, Bld 88 70 - 99 mg/dL    Comment: Glucose reference range applies only to samples taken after fasting for at least 8 hours.   BUN 8 8 - 23 mg/dL   Creatinine, Ser 6.04 0.61 - 1.24 mg/dL   Calcium 8.9 8.9 - 54.0 mg/dL   GFR, Estimated >98 >11 mL/min    Comment: (NOTE) Calculated using the CKD-EPI Creatinine Equation (2021)    Anion gap 8 5 - 15    Comment: Performed at [facility], [address], [place], [place] [address]   DG Chest Port 1 View  Result Date: 10/31/2022 CLINICAL DATA:  Possible sepsis. EXAM: PORTABLE CHEST 1 VIEW COMPARISON:  05/27/2021. FINDINGS: The heart size and mediastinal contours are within normal limits. There is atherosclerotic calcification of the aorta. The pulmonary vasculature is mildly distended. There are low lung volumes with atelectasis at the lung bases. No effusion or pneumothorax. No acute osseous abnormality. IMPRESSION: 1. Mild pulmonary vascular congestion. 2. Low lung volumes with atelectasis at the lung bases. Electronically Signed   By: Thornell Sartorius M.D.   On: 10/31/2022 03:27   DG FACET JT INJ L /S  2ND LEVEL LEFT W/FL/CT  Result Date: 09/12/2022 CLINICAL DATA:  Left low back mechanical pain. EXAM: Left L4-5 and  L5-S1 facet injections COMPARISON:  MRI 07/10/2021.  CT abdomen 09/09/2020. PROCEDURE: The procedure, risks, benefits, and alternatives were explained to the patient. Questions regarding the procedure were encouraged and answered. The patient understands and consents to the procedure. Posterior oblique approaches were taken to the facets on the left at L4-5 and L5-S1 using curved 22 gauge spinal needles. Intra-articular positioning at L4-5 was confirmed by injecting a small amount of Isovue-M 200. No vascular opacification is seen. Forty mg of Depo-Medrol mixed with 0.5 cc 1% lidocaine were instilled into the joint. I am not convinced at the L5-S1 facet joint is not fused or nearly fused, particularly based on the CT scan of July 2022. Today's injection at L5-S1 is probably largely periarticular. There may be a small intra-articular component. Similar Depo-Medrol and lidocaine volumes were injected in this location. The procedure was well-tolerated. FLUOROSCOPY: 2 minutes 12 seconds.  164.28 micro gray meter squared IMPRESSION: Technically successful intra-articular facet injection at L4-5. Left L5-S1 facet injection is probably intra and periarticular. This joint may be fused or nearly fused. Electronically Signed   By: Paulina Fusi M.D.   On: 09/12/2022 13:37   DG FACET JT INJ L /S SINGLE LEVEL LEFT W/FL/CT  Result Date: 09/12/2022 CLINICAL DATA:  Left low back mechanical pain. EXAM: Left L4-5 and L5-S1 facet injections COMPARISON:  MRI 07/10/2021.  CT abdomen 09/09/2020. PROCEDURE: The procedure, risks, benefits, and alternatives were explained to the patient. Questions regarding the procedure were encouraged and answered. The patient understands and consents to the procedure. Posterior oblique approaches were taken to the facets on the left at L4-5 and L5-S1 using curved 22 gauge spinal needles. Intra-articular positioning at L4-5 was confirmed by injecting a small amount of  Isovue-M 200. No vascular  opacification is seen. Forty mg of Depo-Medrol mixed with 0.5 cc 1% lidocaine were instilled into the joint. I am not convinced at the L5-S1 facet joint is not fused or nearly fused, particularly based on the CT scan of July 2022. Today's injection at L5-S1 is probably largely periarticular. There may be a small intra-articular component. Similar Depo-Medrol and lidocaine volumes were injected in this location. The procedure was well-tolerated. FLUOROSCOPY: 2 minutes 12 seconds.  164.28 micro gray meter squared IMPRESSION: Technically successful intra-articular facet injection at L4-5. Left L5-S1 facet injection is probably intra and periarticular. This joint may be fused or nearly fused. Electronically Signed   By: Paulina Fusi M.D.   On: 09/12/2022 13:37      To optimize post-hospital recovery, we collaboratively reviewed the patient's recent stay, clarifying diagnoses, treatment plans, and discharge instructions. The medication regimen was thoroughly discussed, emphasizing safety and side effects. We established a plan for follow-up visits and strategies to minimize emergency department visits. The patient expressed understanding and comfort with the plan, with ongoing communication encouraged. A personalized After-Visit Summary (AVS) provided written instructions for optimal medication adherence and self-care.   Today's encounter also utilized real-time, dynamic patient engagement. Patients actively participate by directly reviewing and updating their medical records through a shared screen. This transparency empowers them to visually confirm chart updates made by the healthcare provider.  This collaborative approach facilitates problem management as we jointly update the problem list, problem overview, and assessment/plan. Ultimately, this process enhances chart accuracy and completeness, fostering shared decision-making, patient education, and informed consent for tests and treatments.     Signed: Lula Olszewski, MD 11/04/2022 12:24 PM

## 2022-11-04 NOTE — ED Triage Notes (Signed)
Pt d/c'd from [facility] hospital 2 days ago after receiving tx for urinary tract infection.  Pt d/c'd with catheter and reports small amount of blood in urine at that time.  Pt reports yesterday he noticed his left testicle beginning to swell and hurt.  Pt seen by PMD this morning and advised to come to ED for evaluation.  Pt AAOx4, NAD in triage.

## 2022-11-04 NOTE — Patient Instructions (Signed)
VISIT SUMMARY:  Dear William Norman, during your visit, we discussed your recent hospital stay for a urinary tract infection and the issues you've been experiencing since then, including significant left testicular swelling and pain, difficulty with self-catheterization, and severe lower back pain. We also discussed your ongoing heart problems, stroke history, and blood thinners usage.  YOUR PLAN:  -TESTICULAR SWELLING: You have severe swelling and pain in your left testicle, which could be a condition called testicular torsion where the spermatic cord gets twisted, cutting off the blood supply to the testicle. We are referring you to the emergency department for immediate evaluation and an ultrasound to rule this out.   -GENERAL HEALTH MAINTENANCE: It's important for you to maintain good nutrition and hydration, continue your self-care activities at home, and follow up with your primary care provider after your hospital stay.  INSTRUCTIONS:  Please go to the emergency department as soon as possible for an evaluation of your testicular swelling. Continue taking your current medications as prescribed, including the adjusted dosage of Ozempic. Make sure to follow up with a urologist in two weeks, or sooner if your symptoms worsen. Also, please continue to monitor your blood pressure regularly and maintain good nutrition and hydration.

## 2022-11-04 NOTE — ED Notes (Signed)
Report given to the next RN... 

## 2022-11-04 NOTE — Discharge Instructions (Signed)
Please follow-up with a urologist I have attached here for you today.  Today your ultrasound shows that you have a an abscess on your left testicle/scrotum that will need to be further evaluated.  Please continue taking your ciprofloxacin if symptoms change or worsen please return to the ER.

## 2022-11-04 NOTE — ED Provider Notes (Signed)
[place] EMERGENCY DEPARTMENT AT [facility] Provider Note   CSN: [id number] Arrival date & time: 11/04/22  1235     History  Chief Complaint  Patient presents with   Testicle Pain    William Norman is a 82 y.o. male history of CAD, AAA, DVT, persistent A-fib on Eliquis, recent hospitalization due to urosepsis with discharged 2 days ago presented for left testicle swelling.  Patient states he was admitted to [facility] and was admitted for urosepsis.  Patient had Foley catheter placed to help him urinate and that he is to follow-up with urology.  Patient notes that the past 2 days since being discharged she has had left testicle swelling and pain and redness in the area.  Patient states he is still urinating into his catheter bag and denies any abdominal pain, nausea vomiting, chest pain, shortness of breath, fevers.  Patient was discharged on ciprofloxacin for his suspected UTI after being discharged and is taking that.  Home Medications Prior to Admission medications   Medication Sig Start Date End Date Taking? Authorizing Provider  acetaminophen (TYLENOL) 500 MG tablet Take 1,500 mg by mouth every morning.    [provider]  apixaban (ELIQUIS) 5 MG TABS tablet Take 1 tablet (5 mg total) by mouth 2 (two) times daily. 03/20/21   Lisbeth Renshaw, MD  atenolol (TENORMIN) 25 MG tablet Take 1 tablet (25 mg total) by mouth daily. 06/07/22   Jodelle Red, MD  atorvastatin (LIPITOR) 40 MG tablet TAKE 1/2 TABLET BY MOUTH DAILY 08/29/22   [name], MD  ciprofloxacin (CIPRO) 500 MG tablet Take 1 tablet (500 mg total) by mouth 2 (two) times daily for 5 days. 11/02/22 11/07/22  Burnadette Pop, MD  Coenzyme Q10 (CO Q 10 PO) Take 1 capsule by mouth in the morning.    [provider]  Cyanocobalamin (VITAMIN B-12) 1000 MCG SUBL Place 1 tablet (1,000 mcg total) under the tongue daily. 07/24/20   [name], MD  finasteride (PROSCAR) 5 MG tablet Take  by mouth. 10/24/22   [provider]  furosemide (LASIX) 20 MG tablet Take 1 tablet (20 mg total) by mouth daily. 06/07/22   Jodelle Red, MD  hydroxypropyl methylcellulose / hypromellose (ISOPTO TEARS / GONIOVISC) 2.5 % ophthalmic solution Place 1 drop into both eyes in the morning.    [provider]  losartan (COZAAR) 25 MG tablet Take 1 tablet (25 mg total) by mouth daily. 05/17/22   Willow Ora, MD  nitroGLYCERIN (NITROSTAT) 0.4 MG SL tablet Dissolve 1 tablet under the tongue every 5 minutes as needed Patient taking differently: Place 0.4 mg under the tongue every 5 (five) minutes as needed for chest pain. 03/09/21   Baldo Daub, MD  tamsulosin (FLOMAX) 0.4 MG CAPS capsule TAKE 1 CAPSULE BY MOUTH EVERY DAY Patient taking differently: Take 0.4 mg by mouth in the morning and at bedtime. 12/30/21   [name], MD  triamcinolone (NASACORT) 55 MCG/ACT AERO nasal inhaler Place 1 spray into the nose daily. Start with 1 spray each side twice a day for 3 days, then reduce to daily until sinus symptoms are better. 08/24/22   Dulce Sellar, NP      Allergies    Nitrous oxide and Simvastatin    Review of Systems   Review of Systems  Genitourinary:  Positive for testicular pain.    Physical Exam Updated Vital Signs BP (!) 148/72   Pulse 79   Temp 97.9 F (  36.6 C) (Oral)   Resp 16   SpO2 98%  Physical Exam Constitutional:      General: He is not in acute distress.    Appearance: He is not ill-appearing or toxic-appearing.  Cardiovascular:     Rate and Rhythm: Normal rate and regular rhythm.     Pulses: Normal pulses.     Heart sounds: Normal heart sounds.  Pulmonary:     Effort: Pulmonary effort is normal. No respiratory distress.     Breath sounds: Normal breath sounds.  Abdominal:     Palpations: Abdomen is soft.     Tenderness: There is no abdominal tenderness. There is no guarding or rebound.  Genitourinary:    Comments: Chaperone: Chales Abrahams, Paramedic Scrotum appears edematous and erythematous Right testicle nontender to palpation Left testicle tender palpation the posterior aspect of the testicle No lymphadenopathy noted No perineum involvement Skin:    General: Skin is warm and dry.  Neurological:     Mental Status: He is alert.     ED Results / Procedures / Treatments   Labs (all labs ordered are listed, but only abnormal results are displayed) Labs Reviewed - No data to display  EKG None  Radiology US SCROTUM W/DOPPLER  Result Date: 11/04/2022 CLINICAL DATA:  left testicle swelling and pain EXAM: SCROTAL ULTRASOUND DOPPLER ULTRASOUND OF THE TESTICLES TECHNIQUE: Complete ultrasound examination of the testicles, epididymis, and other scrotal structures was performed. Color and spectral Doppler ultrasound were also utilized to evaluate blood flow to the testicles. COMPARISON:  None Available. FINDINGS: Right testicle Measurements: 2.4 x 2.7 x 4.5 cm. No mass or microlithiasis visualized. Left testicle Measurements: 2.1 x 2.4 x 3.0 cm. No mass or microlithiasis visualized. Right epididymis: There are several subcentimeter simple cysts. No suspicious lesion. Left epididymis: There are several mildly complex subcentimeter cysts. No suspicious lesion. Hydrocele:  Bilateral mild. Varicocele:  None visualized. Pulsed Doppler interrogation of both testes demonstrates normal low resistance arterial and venous waveforms bilaterally. Separate from the left testicle, in the left scrotal sac, there is an ill-defined hypoechoic 9 x 11 x 13 mm area with increased through transmission and internal low-level echoes and few linear septations. In appropriate clinical setting, this may represent a small scrotal abscess. Correlate clinically. IMPRESSION: 1. No evidence of testicular torsion or mass. 2. There is a 13 mm ill-defined hypoechoic area in the left scrotal sac. In appropriate clinical setting, this may represent a small scrotal  abscess. 3. Bilateral mild hydroceles. Electronically Signed   By: Jules Schick M.D.   On: 11/04/2022 16:20    Procedures Procedures    Medications Ordered in ED Medications - No data to display  ED Course/ Medical Decision Making/ A&P                                 Medical Decision Making Amount and/or Complexity of Data Reviewed Radiology: ordered.   Rudie Serven 82 y.o. presented today for left testicle pain. Working DDx that I considered at this time includes, but not limited to, epididymal orchitis, torsion, hernia, cellulitis, Fournier's.  R/o DDx: epididymal orchitis, torsion, hernia, cellulitis, Fournier's: These are considered less likely due to history of present illness, physical exam, labs/imaging findings  Review of prior external notes: 11/02/2022 discharge summary  Unique Tests and My Interpretation:  Scrotal ultrasound: 13 mm hypoechoic area suspicious of scrotal abscess on the left side, bilateral mild hydroceles  Discussion  with Independent Historian: None  Discussion of Management of Tests:  Bell, MD Urology  Risk: Low: based on diagnostic testing/clinical impression and treatment plan  Risk Stratification Score: None  Staffed with Eloise Harman, MD  Plan: On exam patient was in no acute distress stable vitals.  With a chaperone in the room a GU exam was conducted that does show epididymis tenderness to the left testicle with the scrotum appearing enlarged and erythematous.  Patient did not have any lymph node involvement and did not have abdominal pain.  Upon chart review it appears patient did have a urine culture done before discharge that shows that he was growing Klebsiella for his UTI and is currently taking ciprofloxacin.  Will get ultrasound to further assess although I have a high suspicion patient has epididymo orchitis.  Ultrasound came back showing suspected scrotal abscess.  Since patient does not have this abscess on the superficial  aspect of the scrotum and must be intrascrotal he and so urology was consulted and stated patient could be seen in the office and continue taking his ciprofloxacin.  This plan was verbalized to the patient and patient verbalized understanding.  I spoke to the patient extensively about return precautions and patient voices understanding and acceptance of this plan.  Patient was given return precautions. Patient stable for discharge at this time.  Patient verbalized understanding of plan.         Final Clinical Impression(s) / ED Diagnoses Final diagnoses:  Scrotal abscess    Rx / DC Orders ED Discharge Orders     None         Remi Deter 11/04/22 1717    Rondel Baton, MD 11/06/22 1017

## 2022-11-05 LAB — CULTURE, BLOOD (ROUTINE X 2)
Culture: NO GROWTH
Culture: NO GROWTH
Special Requests: ADEQUATE

## 2022-12-28 ENCOUNTER — Other Ambulatory Visit: Payer: Self-pay | Admitting: Urology

## 2022-12-28 DIAGNOSIS — N138 Other obstructive and reflux uropathy: Secondary | ICD-10-CM

## 2023-01-17 ENCOUNTER — Ambulatory Visit: Payer: Medicare Other | Admitting: Podiatry

## 2023-01-17 ENCOUNTER — Encounter: Payer: Self-pay | Admitting: Podiatry

## 2023-01-17 DIAGNOSIS — M79676 Pain in unspecified toe(s): Secondary | ICD-10-CM

## 2023-01-17 DIAGNOSIS — B351 Tinea unguium: Secondary | ICD-10-CM

## 2023-01-17 NOTE — Progress Notes (Signed)
   Chief Complaint  Patient presents with   RFC    Rm#10 RFC     SUBJECTIVE Patient presents to office today complaining of elongated, thickened nails that cause pain while ambulating in shoes.  Patient is unable to trim their own nails.  Patient states that he was just into the office on 05/03/2022 with another physician and he was unsatisfied with the nail debridement.  Presenting today to have them reevaluated.   Past Medical History:  Diagnosis Date   BPH (benign prostatic hypertrophy)    BPH associated with nocturia 08/29/2013   BPH with obstruction/lower urinary tract symptoms 08/14/2019   CAD (coronary artery disease)    CAD (coronary artery disease), native coronary artery    PTCA of RCA 1986, PTCA of LAD 1992,  Negative treadmill Cardiolite 2011    Chronic dermatitis of hands 10/12/2015   Chronic venous insufficiency    Coagulation disorder (HCC) 10/26/2018   Diverticulitis    DVT (deep venous thrombosis) (HCC)    Dyslipidemia    Dysrhythmia    Eczema    Erectile dysfunction 07/11/2012   Essential hypertension    GERD 08/04/2006       GERD (gastroesophageal reflux disease)    Hyperglycemia 07/08/2019   Hyperlipidemia    Hypertension    Incomplete emptying of bladder 08/14/2019   Long term current use of anticoagulant therapy    Lumbar disc disease    Overweight (BMI 25.0-29.9) 06/18/2015   Persistent atrial fibrillation (HCC) 04/10/2018   Personal history of DVT (deep vein thrombosis)    Initially in 1998 with recurrence in 2002 now on chronic warfarin    Soft tissue lesion of foot 08/27/2015   Stroke (HCC)    Urinary tract infection with hematuria 08/14/2019    Allergies  Allergen Reactions   Nitrous Oxide Other (See Comments)    Pt reports he "passed out and almost died" after inhaling gas through mask at dentist office    Simvastatin Other (See Comments)    Muscle aches      OBJECTIVE General Patient is awake, alert, and oriented x 3 and in no acute  distress. Derm Skin is dry and supple bilateral. Negative open lesions or macerations. Remaining integument unremarkable. Nails are tender, long, thickened and dystrophic with subungual debris, consistent with onychomycosis, 1-5 bilateral. No signs of infection noted. Vasc  DP and PT pedal pulses palpable bilaterally. Temperature gradient within normal limits.  Neuro Epicritic and protective threshold sensation grossly intact bilaterally.  Musculoskeletal Exam No symptomatic pedal deformities noted bilateral. Muscular strength within normal limits.  ASSESSMENT 1.  Pain due to onychomycosis of toenails both  PLAN OF CARE 1. Patient evaluated today.  2. Instructed to maintain good pedal hygiene and foot care.  3. Mechanical debridement of nails 1-5 bilaterally performed using a nail nipper. Filed with dremel without incident.  4. Return to clinic in 3 mos.    Felecia Shelling, DPM [facility]  Dr. Felecia Shelling, DPM    [address] [place], [place] [address]                Office [phone]  Fax [phone]

## 2023-01-18 ENCOUNTER — Ambulatory Visit: Payer: Medicare Other | Admitting: Podiatry

## 2023-01-26 ENCOUNTER — Encounter: Payer: Self-pay | Admitting: Family Medicine

## 2023-01-26 ENCOUNTER — Ambulatory Visit (INDEPENDENT_AMBULATORY_CARE_PROVIDER_SITE_OTHER): Payer: Medicare Other | Admitting: Family Medicine

## 2023-01-26 VITALS — BP 127/83 | HR 70 | Temp 97.5°F | Ht 72.0 in | Wt 222.0 lb

## 2023-01-26 DIAGNOSIS — R251 Tremor, unspecified: Secondary | ICD-10-CM | POA: Diagnosis not present

## 2023-01-26 DIAGNOSIS — I4819 Other persistent atrial fibrillation: Secondary | ICD-10-CM

## 2023-01-26 DIAGNOSIS — R351 Nocturia: Secondary | ICD-10-CM

## 2023-01-26 DIAGNOSIS — N401 Enlarged prostate with lower urinary tract symptoms: Secondary | ICD-10-CM | POA: Diagnosis not present

## 2023-01-26 MED ORDER — FINASTERIDE 5 MG PO TABS: 5.0000 mg | ORAL_TABLET | Freq: Every day | ORAL | 3 refills | Status: DC

## 2023-01-26 NOTE — Assessment & Plan Note (Signed)
Follows with urology.  He is currently on finasteride 5 mg daily and has been in consideration for TURP however they are not pursuing this any further at this point due to him being anticoagulated and him not wanting to come off of anticoagulation due to prior stroke after stopping for 1 day.  He has been referred to IR for consideration for embolization.  We will take over management for his finasteride as it will be cheaper for him to get this through [place] as opposed to the [place].  We can check PSA at his next office visit if this is not done by urology by then.

## 2023-01-26 NOTE — Assessment & Plan Note (Signed)
Continue management per cardiology.  Anticoagulated on Eliquis.  Rate controlled on atenolol 25 mg daily.

## 2023-01-26 NOTE — Progress Notes (Signed)
   William Norman is a 82 y.o. male who presents today for an office visit.  Assessment/Plan:  Chronic Problems Addressed Today: BPH associated with nocturia Follows with urology.  He is currently on finasteride 5 mg daily and has been in consideration for TURP however they are not pursuing this any further at this point due to him being anticoagulated and him not wanting to come off of anticoagulation due to prior stroke after stopping for 1 day.  He has been referred to IR for consideration for embolization.  We will take over management for his finasteride as it will be cheaper for him to get this through [place] as opposed to the [place].  We can check PSA at his next office visit if this is not done by urology by then.   Tremor Patient with resting tremor noted in bilateral arms and head.  This may be an essential tremor but he does have a significant rest component and bradykinesia.  No other red flag signs or symptoms.  No rigidity.    Recommended referral to neurology however patient would like to hold off on this for now as his symptoms are manageable.  He has not had much change in symptoms over the last couple of years.  He will let us know if he changes mind about referral or wishes for further work up with labs.   Persistent atrial fibrillation (HCC) Continue management per cardiology.  Anticoagulated on Eliquis.  Rate controlled on atenolol 25 mg daily.     Subjective:  HPI:  See Assessment / plan for status of chronic conditions.  Patient is here today to discuss finasteride.  He is seeing urology for BPH with lower urinary tract symptoms. They were planning on pursuing TURP however this was postponed due to him being anticoagulated due to atrial fibrillation. We was referred to  IR to consider embolization. He has been finasteride 5 mg daily for the last 10 months. He was previously getting this through the [place] however he would like for [place] to prescribe if possible due to it being  cheaper on his private insurance.  He has been tolerating well.  His urinary symptoms do seem to be improving though still does have occasional issues with this.  Is also been having intermittent tremors in his head and bilateral arms for the last couple of years.  Thinks this started after his stroke.  Does seem to be getting worse recently.  Does not interfere with ADLs.  Symptoms come and go.  No headache.  No weakness or numbness.       Objective:  Physical Exam: BP 127/83   Pulse 70   Temp (!) 97.5 F (36.4 C) (Temporal)   Ht 6' (1.829 m)   Wt 222 lb (100.7 kg)   SpO2 96%   BMI 30.11 kg/m   Gen: No acute distress, resting comfortably Neuro: Grossly normal, moves all extremities.  Mild tremor noted at rest in bilateral arms and head Psych: Normal affect and thought content      Owenn Rothermel M. Jimmey Jerone, MD 01/26/2023 12:22 PM

## 2023-01-26 NOTE — Patient Instructions (Signed)
It was very nice to see you today!  We will take over management for your finasteride.  Please keep your appointment with urology.   Return if symptoms worsen or fail to improve.   Take care, Dr Jimmey Mouhamadou  PLEASE NOTE:  If you had any lab tests, please let us know if you have not heard back within a few days. You may see your results on mychart before we have a chance to review them but we will give you a call once they are reviewed by [place].   If we ordered any referrals today, please let us know if you have not heard from their office within the next week.   If you had any urgent prescriptions sent in today, please check with the pharmacy within an hour of our visit to make sure the prescription was transmitted appropriately.   Please try these tips to maintain a healthy lifestyle:  Eat at least 3 REAL meals and 1-2 snacks per day.  Aim for no more than 5 hours between eating.  If you eat breakfast, please do so within one hour of getting up.   Each meal should contain half fruits/vegetables, one quarter protein, and one quarter carbs (no bigger than a computer mouse)  Cut down on sweet beverages. This includes juice, soda, and sweet tea.   Drink at least 1 glass of water with each meal and aim for at least 8 glasses per day  Exercise at least 150 minutes every week.

## 2023-01-26 NOTE — Assessment & Plan Note (Addendum)
Patient with resting tremor noted in bilateral arms and head.  This may be an essential tremor but he does have a significant rest component and bradykinesia.  No other red flag signs or symptoms.  No rigidity.    Recommended referral to neurology however patient would like to hold off on this for now as his symptoms are manageable.  He has not had much change in symptoms over the last couple of years.  He will let us know if he changes mind about referral or wishes for further work up with labs.

## 2023-01-27 ENCOUNTER — Ambulatory Visit
Admission: RE | Admit: 2023-01-27 | Discharge: 2023-01-27 | Disposition: A | Discharge status: Home / Self Care | Payer: Medicare Other | Source: Ambulatory Visit | Attending: Urology

## 2023-01-27 DIAGNOSIS — N138 Other obstructive and reflux uropathy: Secondary | ICD-10-CM

## 2023-01-27 HISTORY — PX: IR RADIOLOGIST EVAL & MGMT: IMG5224

## 2023-01-27 NOTE — Consult Note (Signed)
Chief Complaint: Urinary retention  Referring Physician(s): Bell,Eugene D III  PCP: Dr. Jacquiline Doe Cardiology: Dr. Sherlean Foot  History of Present Illness: William Norman is a 82 y.o. male presenting today with LUTS secondary to BPH, kindly referred by Dr. Alvester Morin, to discuss his candidacy for prostate artery embolization.   William Norman is here today by himself for the interview.    He tells me that at this point, while he does void sparse urine with effort, he is essentially dependent on self-catheterization. He denies any hematuria.  He has history of a few UTI.  His requirement of self-catheterization is for some time now.  This is very disruptive to his life-style, and decreased his QoL.  He has had discussions with Dr. Alvester Morin regarding surgical options, however, he has permanent atrial fibrillation and is treated with eliquis.  He did come off eliquis once prior for a day, he says, and unfortunately had a TIA.    He is very hesitant to withdraw therapy.  Especially for the length of time required for prostate surgery.   He was admitted to [facility] in September 2024 with urinary retention ans SIRS/sepsis secondary to UTI.   His medical therapy for LUTS has been Tamsulosin 0.4mg .   Previous TRUS shows size of ~66grams.   Flexible cystoscopy showed severe hyperplasia, mild trabeculation of bladder.  Notes reflect that prior eval at the [place] with urodynamics stated: low capacity, high-pressure, low flow, which suggests outlet obstruction.   He denies history of MI. He has had prior stroke/TIA, with afib.  He ambulates without chest symptoms.  He has some left hip pain.  No resting chest pain.   Past Medical History:  Diagnosis Date   BPH (benign prostatic hypertrophy)    BPH associated with nocturia 08/29/2013   BPH with obstruction/lower urinary tract symptoms 08/14/2019   CAD (coronary artery disease)    CAD (coronary artery disease), native coronary artery     PTCA of RCA 1986, PTCA of LAD 1992,  Negative treadmill Cardiolite 2011    Chronic dermatitis of hands 10/12/2015   Chronic venous insufficiency    Coagulation disorder (HCC) 10/26/2018   Diverticulitis    DVT (deep venous thrombosis) (HCC)    Dyslipidemia    Dysrhythmia    Eczema    Erectile dysfunction 07/11/2012   Essential hypertension    GERD 08/04/2006       GERD (gastroesophageal reflux disease)    Hyperglycemia 07/08/2019   Hyperlipidemia    Hypertension    Incomplete emptying of bladder 08/14/2019   Long term current use of anticoagulant therapy    Lumbar disc disease    Overweight (BMI 25.0-29.9) 06/18/2015   Persistent atrial fibrillation (HCC) 04/10/2018   Personal history of DVT (deep vein thrombosis)    Initially in 1998 with recurrence in 2002 now on chronic warfarin    Soft tissue lesion of foot 08/27/2015   Stroke [facility])    Urinary tract infection with hematuria 08/14/2019    Past Surgical History:  Procedure Laterality Date   APPLICATION OF WOUND VAC  09/02/2020   Procedure: APPLICATION OF WOUND VAC;  Surgeon: Manus Rudd, MD;  Location: [facility] OR;  Service: General;;   CARDIAC CATHETERIZATION     CARPAL TUNNEL RELEASE  04/21/2011   Procedure: CARPAL TUNNEL RELEASE;  Surgeon: Tami Ribas, MD;  Location: [facility];  Service: Orthopedics;  Laterality: Left;   CARPAL TUNNEL RELEASE  05/23/2011   Procedure: CARPAL TUNNEL RELEASE;  Surgeon: Tami Ribas, MD;  Location: [facility];  Service: Orthopedics;  Laterality: Right;   COLON SURGERY  2010 and 2011 colostomy reversal   CORONARY ANGIOPLASTY     EYE SURGERY     repair of macular hole   LAPAROTOMY N/A 09/02/2020   Procedure: EXPLORATORY LAPAROTOMY, LYSIS OF ADHESIONS ,BOWEL RESECTION;  Surgeon: Manus Rudd, MD;  Location: [facility] OR;  Service: General;  Laterality: N/A;   LUMBAR LAMINECTOMY/DECOMPRESSION MICRODISCECTOMY Left 03/18/2021   Procedure: LAMINOTOMY, FORAMINOTOMY, LEFT LUMBAR  THREE-FOUR;  Surgeon: Lisbeth Renshaw, MD;  Location: [facility] OR;  Service: Neurosurgery;  Laterality: Left;   NASAL SEPTUM SURGERY     precutaneous transluminal coronary angioplasty     right hernia     TONSILLECTOMY AND ADENOIDECTOMY      Allergies: Nitrous oxide and Simvastatin  Medications: Prior to Admission medications   Medication Sig Start Date End Date Taking? Authorizing Provider  acetaminophen (TYLENOL) 500 MG tablet Take 1,500 mg by mouth every morning.    [provider]  apixaban (ELIQUIS) 5 MG TABS tablet Take 1 tablet (5 mg total) by mouth 2 (two) times daily. 03/20/21   Lisbeth Renshaw, MD  atenolol (TENORMIN) 25 MG tablet Take 1 tablet (25 mg total) by mouth daily. 06/07/22   Jodelle Red, MD  atorvastatin (LIPITOR) 40 MG tablet TAKE 1/2 TABLET BY MOUTH DAILY 08/29/22   [name], MD  Coenzyme Q10 (CO Q 10 PO) Take 1 capsule by mouth in the morning.    [provider]  Cyanocobalamin (VITAMIN B-12) 1000 MCG SUBL Place 1 tablet (1,000 mcg total) under the tongue daily. 07/24/20   [name], MD  finasteride (PROSCAR) 5 MG tablet Take 1 tablet (5 mg total) by mouth daily. 01/26/23   [name], MD  furosemide (LASIX) 20 MG tablet Take 1 tablet (20 mg total) by mouth daily. 06/07/22   Jodelle Red, MD  hydroxypropyl methylcellulose / hypromellose (ISOPTO TEARS / GONIOVISC) 2.5 % ophthalmic solution Place 1 drop into both eyes in the morning.    [provider]  losartan (COZAAR) 25 MG tablet Take 1 tablet (25 mg total) by mouth daily. 05/17/22   Willow Ora, MD  nitroGLYCERIN (NITROSTAT) 0.4 MG SL tablet Dissolve 1 tablet under the tongue every 5 minutes as needed Patient taking differently: Place 0.4 mg under the tongue every 5 (five) minutes as needed for chest pain. 03/09/21   Baldo Daub, MD  tamsulosin (FLOMAX) 0.4 MG CAPS capsule TAKE 1 CAPSULE BY MOUTH EVERY DAY Patient taking differently: Take 0.4 mg by  mouth in the morning and at bedtime. 12/30/21   [name], MD  triamcinolone (NASACORT) 55 MCG/ACT AERO nasal inhaler Place 1 spray into the nose daily. Start with 1 spray each side twice a day for 3 days, then reduce to daily until sinus symptoms are better. 08/24/22   Dulce Sellar, NP     Family History  Problem Relation Age of Onset   Heart disease Mother    Atrial fibrillation Mother    Stroke Mother    Heart failure Mother    Colitis Mother    Mitral valve prolapse Mother    Heart disease Father    Heart attack Father    Atrial fibrillation Father    Heart failure Father    COPD Father    Varicose Veins Father    Osteoporosis Sister    Irritable bowel syndrome Sister    AAA (abdominal  aortic aneurysm) Brother    Diverticulitis Brother     Social History   Socioeconomic History   Marital status: Married    Spouse name: Not on file   Number of children: 3   Years of education: 16   Highest education level: Some college, no degree  Occupational History   Occupation: [employer]  Tobacco Use   Smoking status: Former    Types: Cigarettes   Smokeless tobacco: Never  Vaping Use   Vaping status: Never Used  Substance and Sexual Activity   Alcohol use: Not Currently    Comment: once a month   Drug use: No   Sexual activity: Not on file  Other Topics Concern   Not on file  Social History Narrative   Fun: Working outside in yard, cutting wood    Denies abuse and feels safe at home.    Right handed   Lives at home with wife, daughter, granddaughter, and son (periodically)   Social Drivers of Health   Financial Resource Strain: Low Risk  (08/24/2022)   Overall Financial Resource Strain (CARDIA)    Difficulty of Paying Living Expenses: Not hard at all  Food Insecurity: No Food Insecurity (10/31/2022)   Hunger Vital Sign    Worried About Running Out of Food in the Last Year: Never true    Ran Out of Food in the Last Year: Never true  Transportation Needs: No  Transportation Needs (10/31/2022)   PRAPARE - Administrator, Civil Service (Medical): No    Lack of Transportation (Non-Medical): No  Physical Activity: Inactive (08/24/2022)   Exercise Vital Sign    Days of Exercise per Week: 0 days    Minutes of Exercise per Session: 10 min  Stress: Stress Concern Present (08/24/2022)   [employer] of Occupational Health - Occupational Stress Questionnaire    Feeling of Stress : To some extent  Social Connections: Moderately Isolated (08/24/2022)   Social Connection and Isolation Panel [NHANES]    Frequency of Communication with Friends and Family: More than three times a week    Frequency of Social Gatherings with Friends and Family: Once a week    Attends Religious Services: Never    Database administrator or Organizations: No    Attends Banker Meetings: Never    Marital Status: Married       Review of Systems: A 12 point ROS discussed and pertinent positives are indicated in the HPI above.  All other systems are negative.  Review of Systems  Vital Signs: BP 127/72   Pulse 76   Temp 97.6 F (36.4 C)   Resp 20   SpO2 97%   Advance Care Plan: The advanced care plan/surrogate decision maker was discussed at the time of visit and documented in the medical record.    Physical Exam General: 82 yo male appearing stated age.  Well-developed, well-nourished.  No distress. HEENT: Atraumatic, normocephalic.  Conjugate gaze, extra-ocular motor intact. No scleral icterus or scleral injection. No lesions on external ears, nose, lips, or gums.  Oral mucosa moist, pink.  Neck: Symmetric with no goiter enlargement.  Chest/Lungs:  Symmetric chest with inspiration/expiration.  No labored breathing.  Clear to auscultation with no wheezes, rhonchi, or rales.  Heart:  Irregular rhythym, with no third heart sounds appreciated. No JVD appreciated.  Abdomen:  Soft, NT/ND, with + bowel sounds.   Genito-urinary: Deferred Neurologic:  Alert & Oriented to person, place, and time.   Normal affect and insight.  Appropriate questions.  Essential tremor. Pulse Exam:  No bruit appreciated.  Palpable radial pulses.  Palpable popliteal pulses  Mallampati Score:     Imaging: No results found.  Labs:  CBC: Recent Labs    09/06/22 1432 10/31/22 0304 11/01/22 0703  WBC 7.1 11.9* 8.3  HGB 14.2 15.0 13.9  HCT 43.9 45.8 41.9  PLT 243.0 144* 150    COAGS: Recent Labs    10/31/22 0304  INR 1.2  APTT 33    BMP: Recent Labs    09/06/22 1432 10/31/22 0304 11/01/22 0703 11/02/22 0744  NA 138 135 135 137  K 4.3 3.6 3.4* 3.7  CL 105 105 107 107  CO2 28 19* 22 22  GLUCOSE 95 143* 96 88  BUN 16 20 9 8   CALCIUM 9.8 8.8* 8.3* 8.9  CREATININE 0.79 1.00 0.78 0.71  GFRNONAA  --  >60 >60 >60    LIVER FUNCTION TESTS: Recent Labs    09/06/22 1432 10/31/22 0304 11/01/22 0703  BILITOT 0.9 1.8* 0.9  AST 22 26 26   ALT 16 21 21   ALKPHOS 54 47 43  PROT 6.9 6.3* 5.6*  ALBUMIN 3.9 3.4* 2.7*    TUMOR MARKERS: No results for input(s): "AFPTM", "CEA", "CA199", "CHROMGRNA" in the last 8760 hours.  Assessment and Plan:   William Norman is an 82 yo gentleman presenting with severe lower urinary tract symptoms secondary to BPH, with life-style limiting urinary retention, at this point requiring self-catheterization daily.   Recent admission for UTI and sepsis/sirs.   His estimated prostate size  approximately 66g on POCUS/TR [place]    I had a lengthy discussion with William Norman regarding the relevant anatomy of the prostate to the lower urinary system, the ubiquitous enlargement of the prostate over time/BPH, the resulting LUTS that can be attributable to the BPH, and scope of the issue, (generally accepted to be about 70% of men > 27yrs, with ~25% or more of men >70 with moderate/severe LUTS and decreased QoL.)    We briefly discussed the medical therapy for treatment, including conservative measures (alpha-blockers/5-ARI),  traditional surgical therapies such as TURP, formal prostatectomy, and also the expanding space of minimally invasive therapies/MIST (ex. rezum, uro-lift, holep/thulep, greenlight).     These minimally invasive therapies also now include prostate artery embolization.    Regarding PAE, we discussed the theory of embolization shrinking the gland, the logistics of PAE with VIR, (typically an outpatient procedure lasting 2-4 hours with moderate sedation), and the side-effect profile (namely the post-embolization syndrome which is expected to last about 7-10 days requiring medical therapy/support).    We discussed risk profile, including: bleeding, infection, arterial injury, contrast reaction, organ failure, non-target embolization, local organ injury, need for further surgery/procedure, acute urinary retention with need for catheter, inability to treat during the intention-to-treat angiogram, cardiopulmonary collapse, death.  Our literature cites a major risk profile of 0.5%.    He has 2 indications for intervention/procedure, given the history of UTI/sepsis as well as retention requiring catheterization.    He is hesitant to have formal surgery , as he would need be taken of his [facility], and he is concerned for stroke. While we ordinarily would hold 2 days before arterial access, I do think reasonable to mitigate risk for recurrent TIA by holding only 1 pre-op dose of eliquis the night before, performing the procedure, and then restarting.    He understands.  He is interested in pursuing PAE.    Plan: - We will schedule CTA  pelvis to evaluate arterial anatomy.  - plan for PAE after CTA complete and reviewed, with Dr. Loreta Ave at [facility] - Plan for 1 dose of eliquis hold on the evening before the PAE procedure, based on his risk for TIA - continue current care.   Thank you for this interesting consult.  I greatly enjoyed meeting William Norman and look forward to participating in their care.  A copy of  this report was sent to the requesting provider on this date.  Electronically Signed: Gilmer Mor 01/27/2023, 11:55 AM   I spent a total of  60 Minutes   in face to face in clinical consultation, greater than 50% of which was counseling/coordinating care for LUTS/BPH, possible prostate artery embolization for urinary retention and recurrent UTI

## 2023-01-30 ENCOUNTER — Other Ambulatory Visit: Payer: Self-pay | Admitting: Interventional Radiology

## 2023-01-30 DIAGNOSIS — N401 Enlarged prostate with lower urinary tract symptoms: Secondary | ICD-10-CM

## 2023-02-06 ENCOUNTER — Ambulatory Visit
Admission: RE | Admit: 2023-02-06 | Discharge: 2023-02-06 | Disposition: A | Discharge status: Home / Self Care | Payer: Medicare Other | Source: Ambulatory Visit | Attending: Interventional Radiology | Admitting: Interventional Radiology

## 2023-02-06 DIAGNOSIS — N401 Enlarged prostate with lower urinary tract symptoms: Secondary | ICD-10-CM

## 2023-02-06 MED ORDER — IOPAMIDOL (ISOVUE-370) INJECTION 76%
100.0000 mL | Freq: Once | INTRAVENOUS | Status: AC | PRN
  Administered 2023-02-06: 100 mL via INTRAVENOUS

## 2023-02-13 ENCOUNTER — Telehealth: Payer: Self-pay | Admitting: Cardiology

## 2023-02-13 NOTE — Telephone Encounter (Signed)
   Pre-operative Risk Assessment    Patient Name: William Norman  DOB: Jul 25, 1940 MRN: [id number]   Date of last office visit: 09-27-22 Date of next office visit: NA   Request for Surgical Clearance    Procedure:   Prostates Artery Embolization  Date of Surgery:  Clearance TBD                                Surgeon:  Dr. Loreta Ave Surgeon's Group or Practice Name:  [facility] Phone number:  [phone] Fax number:  [phone]   Type of Clearance Requested:   - Pharmacy:  Hold Apixaban (Eliquis) 1 dose the night prior to procedure   Type of Anesthesia:   Moderate Sedation   Additional requests/questions:  Please advise surgeon/provider what medications should be held.  Signed, Belisicia Sharlene Motts   02/13/2023, 9:23 AM

## 2023-02-13 NOTE — Telephone Encounter (Signed)
   Patient Name: William Norman  DOB: 1941/02/03 MRN: [id number]  Primary Cardiologist: Jodelle Red, MD  Chart reviewed as part of pre-operative protocol coverage. Pre-op clearance already addressed by colleagues in earlier phone notes. To summarize recommendations:  -Per office protocol, patient can hold Eliquis for night prior to and morning of procedure.   Patient will not need bridging with Lovenox (enoxaparin) around procedure.  Medical clearance was not requested but noted that the patient was seen back in August and doing well.  If new heart related symptoms prior to procedure, please contact our office.  Will route this bundled recommendation to requesting provider via Epic fax function and remove from pre-op pool. Please call with questions.  Sharlene Dory, PA-C 02/13/2023, 12:31 PM

## 2023-02-13 NOTE — Telephone Encounter (Signed)
Patient with diagnosis of atrial fibrillation on Eliquis for anticoagulation.    Procedure:   Prostates Artery Embolization   Date of Surgery:  Clearance TBD    CHA2DS2-VASc Score = 6   This indicates a 9.7% annual risk of stroke. The patient's score is based upon: CHF History: 0 HTN History: 1 Diabetes History: 0 Stroke History: 2 Vascular Disease History: 1 Age Score: 2 Gender Score: 0    CrCl 112 Platelet count 150  Per office protocol, patient can hold Eliquis for night prior to and morning of procedure.   Patient will not need bridging with Lovenox (enoxaparin) around procedure.  **This guidance is not considered finalized until pre-operative APP has relayed final recommendations.**

## 2023-02-24 ENCOUNTER — Other Ambulatory Visit (HOSPITAL_COMMUNITY): Payer: Self-pay | Admitting: Interventional Radiology

## 2023-02-24 DIAGNOSIS — N138 Other obstructive and reflux uropathy: Secondary | ICD-10-CM

## 2023-02-27 DIAGNOSIS — R338 Other retention of urine: Secondary | ICD-10-CM | POA: Diagnosis not present

## 2023-02-27 DIAGNOSIS — N401 Enlarged prostate with lower urinary tract symptoms: Secondary | ICD-10-CM | POA: Diagnosis not present

## 2023-04-13 ENCOUNTER — Ambulatory Visit (HOSPITAL_COMMUNITY): Payer: Medicare Other

## 2023-04-13 ENCOUNTER — Other Ambulatory Visit (HOSPITAL_COMMUNITY): Payer: Medicare Other

## 2023-04-17 ENCOUNTER — Encounter: Payer: Self-pay | Admitting: Podiatry

## 2023-04-17 ENCOUNTER — Ambulatory Visit: Payer: Medicare HMO | Admitting: Podiatry

## 2023-04-17 DIAGNOSIS — B351 Tinea unguium: Secondary | ICD-10-CM | POA: Diagnosis not present

## 2023-04-17 DIAGNOSIS — M79676 Pain in unspecified toe(s): Secondary | ICD-10-CM

## 2023-04-17 DIAGNOSIS — R339 Retention of urine, unspecified: Secondary | ICD-10-CM | POA: Insufficient documentation

## 2023-04-17 DIAGNOSIS — D485 Neoplasm of uncertain behavior of skin: Secondary | ICD-10-CM | POA: Insufficient documentation

## 2023-04-17 DIAGNOSIS — R4181 Age-related cognitive decline: Secondary | ICD-10-CM | POA: Insufficient documentation

## 2023-04-17 DIAGNOSIS — H35372 Puckering of macula, left eye: Secondary | ICD-10-CM | POA: Insufficient documentation

## 2023-04-17 DIAGNOSIS — N429 Disorder of prostate, unspecified: Secondary | ICD-10-CM | POA: Insufficient documentation

## 2023-04-17 DIAGNOSIS — M5117 Intervertebral disc disorders with radiculopathy, lumbosacral region: Secondary | ICD-10-CM | POA: Insufficient documentation

## 2023-04-17 DIAGNOSIS — G8929 Other chronic pain: Secondary | ICD-10-CM | POA: Insufficient documentation

## 2023-04-17 DIAGNOSIS — I5032 Chronic diastolic (congestive) heart failure: Secondary | ICD-10-CM | POA: Insufficient documentation

## 2023-04-17 DIAGNOSIS — Z8719 Personal history of other diseases of the digestive system: Secondary | ICD-10-CM | POA: Insufficient documentation

## 2023-04-17 DIAGNOSIS — H04123 Dry eye syndrome of bilateral lacrimal glands: Secondary | ICD-10-CM | POA: Insufficient documentation

## 2023-04-17 DIAGNOSIS — Z961 Presence of intraocular lens: Secondary | ICD-10-CM | POA: Insufficient documentation

## 2023-04-17 DIAGNOSIS — R2242 Localized swelling, mass and lump, left lower limb: Secondary | ICD-10-CM | POA: Insufficient documentation

## 2023-04-17 NOTE — Progress Notes (Signed)
   Chief Complaint  Patient presents with   Debridement    Trim toenails     SUBJECTIVE Patient presents to office today complaining of elongated, thickened nails that cause pain while ambulating in shoes.  Patient is unable to trim their own nails.  Patient states that he was just into the office on 05/03/2022 with another physician and he was unsatisfied with the nail debridement.  Presenting today to have them reevaluated.   Past Medical History:  Diagnosis Date   BPH (benign prostatic hypertrophy)    BPH associated with nocturia 08/29/2013   BPH with obstruction/lower urinary tract symptoms 08/14/2019   CAD (coronary artery disease)    CAD (coronary artery disease), native coronary artery    PTCA of RCA 1986, PTCA of LAD 1992,  Negative treadmill Cardiolite 2011    Chronic dermatitis of hands 10/12/2015   Chronic venous insufficiency    Coagulation disorder (HCC) 10/26/2018   Diverticulitis    DVT (deep venous thrombosis) (HCC)    Dyslipidemia    Dysrhythmia    Eczema    Erectile dysfunction 07/11/2012   Essential hypertension    GERD 08/04/2006       GERD (gastroesophageal reflux disease)    Hyperglycemia 07/08/2019   Hyperlipidemia    Hypertension    Incomplete emptying of bladder 08/14/2019   Long term current use of anticoagulant therapy    Lumbar disc disease    Overweight (BMI 25.0-29.9) 06/18/2015   Persistent atrial fibrillation (HCC) 04/10/2018   Personal history of DVT (deep vein thrombosis)    Initially in 1998 with recurrence in 2002 now on chronic warfarin    Soft tissue lesion of foot 08/27/2015   Stroke (HCC)    Urinary tract infection with hematuria 08/14/2019    Allergies  Allergen Reactions   Nitrous Oxide Other (See Comments)    Pt reports he "passed out and almost died" after inhaling gas through mask at dentist office    Simvastatin Other (See Comments)    Muscle aches      OBJECTIVE General Patient is awake, alert, and oriented x 3 and  in no acute distress. Derm Skin is dry and supple bilateral. Negative open lesions or macerations. Remaining integument unremarkable. Nails are tender, long, thickened and dystrophic with subungual debris, consistent with onychomycosis, 1-5 bilateral. No signs of infection noted. Vasc  DP and PT pedal pulses palpable bilaterally. Temperature gradient within normal limits.  Neuro Epicritic and protective threshold sensation grossly intact bilaterally.  Musculoskeletal Exam No symptomatic pedal deformities noted bilateral. Muscular strength within normal limits.  ASSESSMENT 1.  Pain due to onychomycosis of toenails both  PLAN OF CARE 1. Patient evaluated today.  2. Instructed to maintain good pedal hygiene and foot care.  3. Mechanical debridement of nails 1-5 bilaterally performed using a nail nipper. Filed with dremel without incident.  4. Return to clinic in 3 mos.    Felecia Shelling, DPM [facility]  Dr. Felecia Shelling, DPM    [address] [place], [place] [address]                Office [phone]  Fax [phone]

## 2023-05-17 ENCOUNTER — Other Ambulatory Visit (HOSPITAL_BASED_OUTPATIENT_CLINIC_OR_DEPARTMENT_OTHER): Payer: Self-pay | Admitting: Cardiology

## 2023-05-17 DIAGNOSIS — I10 Essential (primary) hypertension: Secondary | ICD-10-CM

## 2023-05-19 ENCOUNTER — Other Ambulatory Visit: Payer: Self-pay | Admitting: Family Medicine

## 2023-05-23 ENCOUNTER — Ambulatory Visit (INDEPENDENT_AMBULATORY_CARE_PROVIDER_SITE_OTHER): Payer: Medicare Other | Admitting: Family Medicine

## 2023-05-23 ENCOUNTER — Encounter: Payer: Self-pay | Admitting: Family Medicine

## 2023-05-23 VITALS — BP 126/60 | HR 96 | Temp 98.2°F | Ht 72.0 in | Wt 217.2 lb

## 2023-05-23 DIAGNOSIS — Z0001 Encounter for general adult medical examination with abnormal findings: Secondary | ICD-10-CM

## 2023-05-23 DIAGNOSIS — N401 Enlarged prostate with lower urinary tract symptoms: Secondary | ICD-10-CM

## 2023-05-23 DIAGNOSIS — R351 Nocturia: Secondary | ICD-10-CM

## 2023-05-23 DIAGNOSIS — R739 Hyperglycemia, unspecified: Secondary | ICD-10-CM | POA: Diagnosis not present

## 2023-05-23 DIAGNOSIS — I4819 Other persistent atrial fibrillation: Secondary | ICD-10-CM

## 2023-05-23 DIAGNOSIS — I10 Essential (primary) hypertension: Secondary | ICD-10-CM

## 2023-05-23 DIAGNOSIS — I25118 Atherosclerotic heart disease of native coronary artery with other forms of angina pectoris: Secondary | ICD-10-CM | POA: Diagnosis not present

## 2023-05-23 DIAGNOSIS — E785 Hyperlipidemia, unspecified: Secondary | ICD-10-CM

## 2023-05-23 LAB — CBC
HCT: 44.4 % (ref 39.0–52.0)
Hemoglobin: 14.6 g/dL (ref 13.0–17.0)
MCHC: 32.9 g/dL (ref 30.0–36.0)
MCV: 91.2 fl (ref 78.0–100.0)
Platelets: 197 10*3/uL (ref 150.0–400.0)
RBC: 4.87 Mil/uL (ref 4.22–5.81)
RDW: 14.4 % (ref 11.5–15.5)
WBC: 7.4 10*3/uL (ref 4.0–10.5)

## 2023-05-23 LAB — TSH: TSH: 2.4 u[IU]/mL (ref 0.35–5.50)

## 2023-05-23 LAB — COMPREHENSIVE METABOLIC PANEL WITH GFR
ALT: 16 U/L (ref 0–53)
AST: 24 U/L (ref 0–37)
Albumin: 4.2 g/dL (ref 3.5–5.2)
Alkaline Phosphatase: 54 U/L (ref 39–117)
BUN: 17 mg/dL (ref 6–23)
CO2: 27 meq/L (ref 19–32)
Calcium: 9.4 mg/dL (ref 8.4–10.5)
Chloride: 104 meq/L (ref 96–112)
Creatinine, Ser: 0.73 mg/dL (ref 0.40–1.50)
GFR: 84.2 mL/min (ref >60.00)
Glucose, Bld: 89 mg/dL (ref 70–99)
Potassium: 4.1 meq/L (ref 3.5–5.1)
Sodium: 139 meq/L (ref 135–145)
Total Bilirubin: 1.2 mg/dL (ref 0.2–1.2)
Total Protein: 6.9 g/dL (ref 6.0–8.3)

## 2023-05-23 LAB — LIPID PANEL
Cholesterol: 114 mg/dL (ref 0–200)
HDL: 44.3 mg/dL (ref >39.00)
LDL Cholesterol: 56 mg/dL (ref 0–99)
NonHDL: 69.3
Total CHOL/HDL Ratio: 3
Triglycerides: 67 mg/dL (ref 0.0–149.0)
VLDL: 13.4 mg/dL (ref 0.0–40.0)

## 2023-05-23 LAB — HEMOGLOBIN A1C: Hgb A1c MFr Bld: 5.8 % (ref 4.6–6.5)

## 2023-05-23 NOTE — Assessment & Plan Note (Signed)
 Following with urology.  On finasteride and Flomax.

## 2023-05-23 NOTE — Progress Notes (Signed)
 Chief Complaint:  William Norman is a 83 y.o. male who presents today for his annual comprehensive physical exam.    Assessment/Plan:  Chronic Problems Addressed Today: Dyslipidemia Check lipids.  He is on Lipitor 20 mg daily.  Tolerating well.  Essential hypertension On atenolol 25 mg daily and losartan 25 mg daily.  CAD (coronary artery disease), native coronary artery Following with cardiology.  On Lipitor 20 mg daily.  Anticoagulated on Eliquis for A-fib.  BPH associated with nocturia Following with urology.  On finasteride and Flomax.  Persistent atrial fibrillation (HCC) Anticoagulated on Eliquis and rate controlled on atenolol 25 mg daily per cardiology.  Hyperglycemia Check lipids.  Preventative Healthcare: Check labs.  He did ask about colon cancer screening.  Due to his age would not recommend further colonoscopies at this point.  He is up-to-date on vaccines.  Patient Counseling(The following topics were reviewed and/or handout was given):  -Nutrition: Stressed importance of moderation in sodium/caffeine intake, saturated fat and cholesterol, caloric balance, sufficient intake of fresh fruits, vegetables, and fiber.  -Stressed the importance of regular exercise.   -Substance Abuse: Discussed cessation/primary prevention of tobacco, alcohol, or other drug use; driving or other dangerous activities under the influence; availability of treatment for abuse.   -Injury prevention: Discussed safety belts, safety helmets, smoke detector, smoking near bedding or upholstery.   -Sexuality: Discussed sexually transmitted diseases, partner selection, use of condoms, avoidance of unintended pregnancy and contraceptive alternatives.   -Dental health: Discussed importance of regular tooth brushing, flossing, and dental visits.  -Health maintenance and immunizations reviewed. Please refer to Health maintenance section.  Return to care in 1 year for next preventative visit.      Subjective:  HPI:  He has no acute complaints today. See Assessment / plan for status of chronic conditions.   Lifestyle Diet: Balanced. Plenty of fruits and vegetables.  Exercise: Limited.      01/26/2023   11:45 AM  Depression screen PHQ 2/9  Decreased Interest 1  Down, Depressed, Hopeless 1  PHQ - 2 Score 2  Altered sleeping 2  Tired, decreased energy 1  Change in appetite 0  Feeling bad or failure about yourself  1  Trouble concentrating 0  Moving slowly or fidgety/restless 2  Suicidal thoughts 0  PHQ-9 Score 8  Difficult doing work/chores Somewhat difficult    Health Maintenance Due  Topic Date Due   COVID-19 Vaccine (10 - Pfizer risk 2024-25 season) 04/14/2023     ROS: Per HPI, otherwise a complete review of systems was negative.   PMH:  The following were reviewed and entered/updated in epic: Past Medical History:  Diagnosis Date   BPH (benign prostatic hypertrophy)    BPH associated with nocturia 08/29/2013   BPH with obstruction/lower urinary tract symptoms 08/14/2019   CAD (coronary artery disease)    CAD (coronary artery disease), native coronary artery    PTCA of RCA 1986, PTCA of LAD 1992,  Negative treadmill Cardiolite 2011    Chronic dermatitis of hands 10/12/2015   Chronic venous insufficiency    Coagulation disorder (HCC) 10/26/2018   Diverticulitis    DVT (deep venous thrombosis) (HCC)    Dyslipidemia    Dysrhythmia    Eczema    Erectile dysfunction 07/11/2012   Essential hypertension    GERD 08/04/2006       GERD (gastroesophageal reflux disease)    Hyperglycemia 07/08/2019   Hyperlipidemia    Hypertension    Incomplete emptying of bladder 08/14/2019  Long term current use of anticoagulant therapy    Lumbar disc disease    Overweight (BMI 25.0-29.9) 06/18/2015   Persistent atrial fibrillation (HCC) 04/10/2018   Personal history of DVT (deep vein thrombosis)    Initially in 1998 with recurrence in 2002 now on chronic  warfarin    Soft tissue lesion of foot 08/27/2015   Stroke (HCC)    Urinary tract infection with hematuria 08/14/2019   Patient Active Problem List   Diagnosis Date Noted   Age-related cognitive decline 04/17/2023   Bilateral pseudophakia 04/17/2023   Chronic diastolic (congestive) heart failure (HCC) 04/17/2023   Chronic low back pain 04/17/2023   Disorder of prostate, unspecified 04/17/2023   Dry eye syndrome of bilateral lacrimal glands 04/17/2023   History of small bowel obstruction 04/17/2023   Intervertebral disc disorders with radiculopathy, lumbosacral region 04/17/2023   Localized swelling, mass and lump, left lower limb 04/17/2023   Neoplasm of uncertain behavior of skin 04/17/2023   Puckering of macula, left eye 04/17/2023   Retention of urine, unspecified 04/17/2023   Tremor 01/26/2023   UTI (urinary tract infection) 11/01/2022   SIRS (systemic inflammatory response syndrome) (HCC) 10/31/2022   Permanent atrial fibrillation (HCC) 03/29/2022   Secondary hypercoagulable state (HCC) 03/29/2022   History of CVA (cerebrovascular accident) 03/29/2022   Depression, major, single episode, mild (HCC) 08/24/2021   Osteoarthritis 11/20/2020   Constipation 10/02/2020   Protein-calorie malnutrition, severe 09/03/2020   SBO (small bowel obstruction) (HCC) 08/28/2020   AAA (abdominal aortic aneurysm) (HCC) 03/06/2020   History of ischemic stroke 02/16/2020   DDD (degenerative disc disease), lumbosacral 02/11/2020   Recurrent UTI 08/14/2019   Hyperglycemia 07/08/2019   Persistent atrial fibrillation (HCC) 04/10/2018   Chronic dermatitis of hands 10/12/2015   Stroke (HCC) 06/18/2015   BPH associated with nocturia 08/29/2013   CAD (coronary artery disease), native coronary artery    Long term current use of anticoagulant    Personal history of DVT (deep vein thrombosis)    Chronic venous insufficiency    Erectile dysfunction 07/11/2012   Lumbar radiculopathy    Dyslipidemia     Essential hypertension    Past Surgical History:  Procedure Laterality Date   APPLICATION OF WOUND VAC  09/02/2020   Procedure: APPLICATION OF WOUND VAC;  Surgeon: Manus Rudd, MD;  Location: [facility];  Service: General;;   CARDIAC CATHETERIZATION     CARPAL TUNNEL RELEASE  04/21/2011   Procedure: CARPAL TUNNEL RELEASE;  Surgeon: Tami Ribas, MD;  Location: [facility];  Service: Orthopedics;  Laterality: Left;   CARPAL TUNNEL RELEASE  05/23/2011   Procedure: CARPAL TUNNEL RELEASE;  Surgeon: Tami Ribas, MD;  Location: [facility];  Service: Orthopedics;  Laterality: Right;   COLON SURGERY  2010 and 2011 colostomy reversal   CORONARY ANGIOPLASTY     EYE SURGERY     repair of macular hole   IR RADIOLOGIST EVAL & MGMT  01/27/2023   LAPAROTOMY N/A 09/02/2020   Procedure: EXPLORATORY LAPAROTOMY, LYSIS OF ADHESIONS ,BOWEL RESECTION;  Surgeon: Manus Rudd, MD;  Location: [facility] OR;  Service: General;  Laterality: N/A;   LUMBAR LAMINECTOMY/DECOMPRESSION MICRODISCECTOMY Left 03/18/2021   Procedure: LAMINOTOMY, FORAMINOTOMY, LEFT LUMBAR THREE-FOUR;  Surgeon: Lisbeth Renshaw, MD;  Location: [facility];  Service: Neurosurgery;  Laterality: Left;   NASAL SEPTUM SURGERY     precutaneous transluminal coronary angioplasty     right hernia     TONSILLECTOMY AND ADENOIDECTOMY      Family  History  Problem Relation Age of Onset   Heart disease Mother    Atrial fibrillation Mother    Stroke Mother    Heart failure Mother    Colitis Mother    Mitral valve prolapse Mother    Heart disease Father    Heart attack Father    Atrial fibrillation Father    Heart failure Father    COPD Father    Varicose Veins Father    Osteoporosis Sister    Irritable bowel syndrome Sister    AAA (abdominal aortic aneurysm) Brother    Diverticulitis Brother     Medications- reviewed and updated Current Outpatient Medications  Medication Sig Dispense Refill   acetaminophen (TYLENOL) 500 MG  tablet Take 1,500 mg by mouth every morning.     apixaban (ELIQUIS) 5 MG TABS tablet Take 1 tablet (5 mg total) by mouth 2 (two) times daily. 60 tablet 11   atenolol (TENORMIN) 25 MG tablet Take 1 tablet (25 mg total) by mouth daily. 90 tablet 3   atorvastatin (LIPITOR) 40 MG tablet TAKE 1/2 TABLET BY MOUTH DAILY 45 tablet 3   Coenzyme Q10 (CO Q 10 PO) Take 1 capsule by mouth in the morning.     Cyanocobalamin (VITAMIN B-12) 1000 MCG SUBL Place 1 tablet (1,000 mcg total) under the tongue daily. 90 tablet 0   finasteride (PROSCAR) 5 MG tablet Take 1 tablet (5 mg total) by mouth daily. 90 tablet 3   furosemide (LASIX) 20 MG tablet TAKE 1 TABLET BY MOUTH EVERY DAY 90 tablet 0   hydroxypropyl methylcellulose / hypromellose (ISOPTO TEARS / GONIOVISC) 2.5 % ophthalmic solution Place 1 drop into both eyes in the morning.     losartan (COZAAR) 25 MG tablet TAKE 1 TABLET (25 MG TOTAL) BY MOUTH DAILY. 90 tablet 3   nitroGLYCERIN (NITROSTAT) 0.4 MG SL tablet Dissolve 1 tablet under the tongue every 5 minutes as needed (Patient taking differently: Place 0.4 mg under the tongue every 5 (five) minutes as needed for chest pain.) 90 tablet 1   tamsulosin (FLOMAX) 0.4 MG CAPS capsule TAKE 1 CAPSULE BY MOUTH EVERY DAY (Patient taking differently: Take 0.4 mg by mouth in the morning and at bedtime.) 90 capsule 1   triamcinolone (NASACORT) 55 MCG/ACT AERO nasal inhaler Place 1 spray into the nose daily. Start with 1 spray each side twice a day for 3 days, then reduce to daily until sinus symptoms are better. 1 each 2   No current facility-administered medications for this visit.    Allergies-reviewed and updated Allergies  Allergen Reactions   Nitrous Oxide Other (See Comments)    Pt reports he "passed out and almost died" after inhaling gas through mask at dentist office    Simvastatin Other (See Comments)    Muscle aches     Social History   Socioeconomic History   Marital status: Married    Spouse  name: Not on file   Number of children: 3   Years of education: 16   Highest education level: Some college, no degree  Occupational History   Occupation: [employer]  Tobacco Use   Smoking status: Former    Types: Cigarettes   Smokeless tobacco: Never  Vaping Use   Vaping status: Never Used  Substance and Sexual Activity   Alcohol use: Not Currently    Comment: once a month   Drug use: No   Sexual activity: Not on file  Other Topics Concern   Not on file  Social  History Narrative   Fun: Working outside in yard, cutting wood    Denies abuse and feels safe at home.    Right handed   Lives at home with wife, daughter, granddaughter, and son (periodically)   Social Drivers of Health   Financial Resource Strain: Low Risk  (08/24/2022)   Overall Financial Resource Strain (CARDIA)    Difficulty of Paying Living Expenses: Not hard at all  Food Insecurity: No Food Insecurity (10/31/2022)   Hunger Vital Sign    Worried About Running Out of Food in the Last Year: Never true    Ran Out of Food in the Last Year: Never true  Transportation Needs: No Transportation Needs (10/31/2022)   PRAPARE - Administrator, Civil Service (Medical): No    Lack of Transportation (Non-Medical): No  Physical Activity: Inactive (08/24/2022)   Exercise Vital Sign    Days of Exercise per Week: 0 days    Minutes of Exercise per Session: 10 min  Stress: Stress Concern Present (08/24/2022)   [employer] of Occupational Health - Occupational Stress Questionnaire    Feeling of Stress : To some extent  Social Connections: Moderately Isolated (08/24/2022)   Social Connection and Isolation Panel [NHANES]    Frequency of Communication with Friends and Family: More than three times a week    Frequency of Social Gatherings with Friends and Family: Once a week    Attends Religious Services: Never    Database administrator or Organizations: No    Attends Engineer, structural: Never    Marital  Status: Married        Objective:  Physical Exam: BP 126/60   Pulse 96   Temp 98.2 F (36.8 C) (Temporal)   Ht 6' (1.829 m)   Wt 217 lb 3.2 oz (98.5 kg)   SpO2 (!) 67%   BMI 29.46 kg/m   Body mass index is 29.46 kg/m. Wt Readings from Last 3 Encounters:  05/23/23 217 lb 3.2 oz (98.5 kg)  01/26/23 222 lb (100.7 kg)  11/04/22 220 lb 6 oz (100 kg)   Gen: NAD, resting comfortably HEENT: TMs normal bilaterally. OP clear. No thyromegaly noted.  CV: Irregular with no murmurs appreciated Pulm: NWOB, CTAB with no crackles, wheezes, or rhonchi GI: Normal bowel sounds present. Soft, Nontender, Nondistended. MSK: no edema, cyanosis, or clubbing noted Skin: warm, dry Neuro: CN2-12 grossly intact. Strength 5/5 in upper and lower extremities. Reflexes symmetric and intact bilaterally.  Psych: Normal affect and thought content     Arthella Headings M. Jimmey Chais, MD 05/23/2023 1:42 PM

## 2023-05-23 NOTE — Assessment & Plan Note (Signed)
Check lipids. He is on Lipitor 20 mg daily. Tolerating well.

## 2023-05-23 NOTE — Assessment & Plan Note (Signed)
 Check lipids

## 2023-05-23 NOTE — Patient Instructions (Signed)
 It was very nice to see you today!  We will check blood work.  Please check with your insurance about what type of colon cancer screening you need.  Please continue to work on diet and exercise.  Return in about 6 months (around 11/22/2023) for Follow Up.   Take care, Dr Jimmey Loyde  PLEASE NOTE:  If you had any lab tests, please let us know if you have not heard back within a few days. You may see your results on mychart before we have a chance to review them but we will give you a call once they are reviewed by [place].   If we ordered any referrals today, please let us know if you have not heard from their office within the next week.   If you had any urgent prescriptions sent in today, please check with the pharmacy within an hour of our visit to make sure the prescription was transmitted appropriately.   Please try these tips to maintain a healthy lifestyle:  Eat at least 3 REAL meals and 1-2 snacks per day.  Aim for no more than 5 hours between eating.  If you eat breakfast, please do so within one hour of getting up.   Each meal should contain half fruits/vegetables, one quarter protein, and one quarter carbs (no bigger than a computer mouse)  Cut down on sweet beverages. This includes juice, soda, and sweet tea.   Drink at least 1 glass of water with each meal and aim for at least 8 glasses per day  Exercise at least 150 minutes every week.    Preventive Care 64 Years and Older, Male Preventive care refers to lifestyle choices and visits with your health care provider that can promote health and wellness. Preventive care visits are also called wellness exams. What can I expect for my preventive care visit? Counseling During your preventive care visit, your health care provider may ask about your: Medical history, including: Past medical problems. Family medical history. History of falls. Current health, including: Emotional well-being. Home life and relationship  well-being. Sexual activity. Memory and ability to understand (cognition). Lifestyle, including: Alcohol, nicotine or tobacco, and drug use. Access to firearms. Diet, exercise, and sleep habits. Work and work Astronomer. Sunscreen use. Safety issues such as seatbelt and bike helmet use. Physical exam Your health care provider will check your: Height and weight. These may be used to calculate your BMI (body mass index). BMI is a measurement that tells if you are at a healthy weight. Waist circumference. This measures the distance around your waistline. This measurement also tells if you are at a healthy weight and may help predict your risk of certain diseases, such as type 2 diabetes and high blood pressure. Heart rate and blood pressure. Body temperature. Skin for abnormal spots. What immunizations do I need?  Vaccines are usually given at various ages, according to a schedule. Your health care provider will recommend vaccines for you based on your age, medical history, and lifestyle or other factors, such as travel or where you work. What tests do I need? Screening Your health care provider may recommend screening tests for certain conditions. This may include: Lipid and cholesterol levels. Diabetes screening. This is done by checking your blood sugar (glucose) after you have not eaten for a while (fasting). Hepatitis C test. Hepatitis B test. HIV (human immunodeficiency virus) test. STI (sexually transmitted infection) testing, if you are at risk. Lung cancer screening. Colorectal cancer screening. Prostate cancer screening. Abdominal aortic aneurysm (  AAA) screening. You may need this if you are a current or former smoker. Talk with your health care provider about your test results, treatment options, and if necessary, the need for more tests. Follow these instructions at home: Eating and drinking  Eat a diet that includes fresh fruits and vegetables, whole grains, lean  protein, and low-fat dairy products. Limit your intake of foods with high amounts of sugar, saturated fats, and salt. Take vitamin and mineral supplements as recommended by your health care provider. Do not drink alcohol if your health care provider tells you not to drink. If you drink alcohol: Limit how much you have to 0-2 drinks a day. Know how much alcohol is in your drink. In the [place] one drink equals one 12 oz bottle of beer (355 mL), one 5 oz glass of wine (148 mL), or one 1 oz glass of hard liquor (44 mL). Lifestyle Brush your teeth every morning and night with fluoride toothpaste. Floss one time each day. Exercise for at least 30 minutes 5 or more days each week. Do not use any products that contain nicotine or tobacco. These products include cigarettes, chewing tobacco, and vaping devices, such as e-cigarettes. If you need help quitting, ask your health care provider. Do not use drugs. If you are sexually active, practice safe sex. Use a condom or other form of protection to prevent STIs. Take aspirin only as told by your health care provider. Make sure that you understand how much to take and what form to take. Work with your health care provider to find out whether it is safe and beneficial for you to take aspirin daily. Ask your health care provider if you need to take a cholesterol-lowering medicine (statin). Find healthy ways to manage stress, such as: Meditation, yoga, or listening to music. Journaling. Talking to a trusted person. Spending time with friends and family. Safety Always wear your seat belt while driving or riding in a vehicle. Do not drive: If you have been drinking alcohol. Do not ride with someone who has been drinking. When you are tired or distracted. While texting. If you have been using any mind-altering substances or drugs. Wear a helmet and other protective equipment during sports activities. If you have firearms in your house, make sure you follow  all gun safety procedures. Minimize exposure to UV radiation to reduce your risk of skin cancer. What's next? Visit your health care provider once a year for an annual wellness visit. Ask your health care provider how often you should have your eyes and teeth checked. Stay up to date on all vaccines. This information is not intended to replace advice given to you by your health care provider. Make sure you discuss any questions you have with your health care provider. Document Revised: 07/29/2020 Document Reviewed: 07/29/2020 Elsevier Patient Education  2024 [employer].

## 2023-05-23 NOTE — Assessment & Plan Note (Signed)
 Following with cardiology.  On Lipitor 20 mg daily.  Anticoagulated on Eliquis for A-fib.

## 2023-05-23 NOTE — Assessment & Plan Note (Signed)
 Anticoagulated on Eliquis and rate controlled on atenolol 25 mg daily per cardiology.

## 2023-05-23 NOTE — Assessment & Plan Note (Signed)
 On atenolol 25 mg daily and losartan 25 mg daily.

## 2023-05-24 ENCOUNTER — Other Ambulatory Visit: Payer: Self-pay

## 2023-05-24 ENCOUNTER — Other Ambulatory Visit: Payer: Self-pay | Admitting: *Deleted

## 2023-05-24 DIAGNOSIS — K921 Melena: Secondary | ICD-10-CM

## 2023-05-24 DIAGNOSIS — Z1211 Encounter for screening for malignant neoplasm of colon: Secondary | ICD-10-CM

## 2023-05-24 NOTE — Telephone Encounter (Signed)
 We can order FOBT.  William Norman. [name], MD 05/24/2023 10:08 AM

## 2023-05-25 ENCOUNTER — Encounter: Payer: Self-pay | Admitting: Family Medicine

## 2023-05-25 NOTE — Progress Notes (Signed)
 Labs are all at goal.  He should keep up the great work and we can recheck again in 6 to 12 months.

## 2023-05-29 ENCOUNTER — Other Ambulatory Visit (INDEPENDENT_AMBULATORY_CARE_PROVIDER_SITE_OTHER)

## 2023-05-29 DIAGNOSIS — K921 Melena: Secondary | ICD-10-CM

## 2023-05-29 LAB — FECAL OCCULT BLOOD, IMMUNOCHEMICAL: Fecal Occult Bld: POSITIVE — AB

## 2023-05-30 ENCOUNTER — Encounter: Payer: Self-pay | Admitting: Family Medicine

## 2023-05-30 NOTE — Progress Notes (Signed)
 FOBT is positive. Recommend referral to gastroenterology.

## 2023-05-31 ENCOUNTER — Other Ambulatory Visit: Payer: Self-pay | Admitting: *Deleted

## 2023-05-31 DIAGNOSIS — Z8719 Personal history of other diseases of the digestive system: Secondary | ICD-10-CM

## 2023-06-06 ENCOUNTER — Other Ambulatory Visit: Payer: Self-pay

## 2023-07-17 ENCOUNTER — Encounter: Payer: Self-pay | Admitting: Family Medicine

## 2023-08-04 ENCOUNTER — Ambulatory Visit: Admitting: Family Medicine

## 2023-08-08 ENCOUNTER — Ambulatory Visit (INDEPENDENT_AMBULATORY_CARE_PROVIDER_SITE_OTHER): Admitting: Family Medicine

## 2023-08-08 ENCOUNTER — Encounter: Payer: Self-pay | Admitting: Family Medicine

## 2023-08-08 VITALS — BP 101/64 | HR 75 | Temp 97.2°F | Ht 72.0 in | Wt 213.4 lb

## 2023-08-08 DIAGNOSIS — M5416 Radiculopathy, lumbar region: Secondary | ICD-10-CM

## 2023-08-08 DIAGNOSIS — R351 Nocturia: Secondary | ICD-10-CM

## 2023-08-08 DIAGNOSIS — I10 Essential (primary) hypertension: Secondary | ICD-10-CM | POA: Diagnosis not present

## 2023-08-08 DIAGNOSIS — N401 Enlarged prostate with lower urinary tract symptoms: Secondary | ICD-10-CM | POA: Diagnosis not present

## 2023-08-08 MED ORDER — TAMSULOSIN HCL 0.4 MG PO CAPS: 0.4000 mg | ORAL_CAPSULE | Freq: Every day | ORAL | 1 refills | Status: DC

## 2023-08-08 NOTE — Patient Instructions (Signed)
 It was very nice to see you today!  I will refill your Flomax .  Please follow-up with Dr. Joane soon to discuss your neck and back pain.  Return if symptoms worsen or fail to improve.   Take care, Dr Kennyth  PLEASE NOTE:  If you had any lab tests, please let us  know if you have not heard back within a few days. You may see your results on mychart before we have a chance to review them but we will give you a call once they are reviewed by us .   If we ordered any referrals today, please let us  know if you have not heard from their office within the next week.   If you had any urgent prescriptions sent in today, please check with the pharmacy within an hour of our visit to make sure the prescription was transmitted appropriately.   Please try these tips to maintain a healthy lifestyle:  Eat at least 3 REAL meals and 1-2 snacks per day.  Aim for no more than 5 hours between eating.  If you eat breakfast, please do so within one hour of getting up.   Each meal should contain half fruits/vegetables, one quarter protein, and one quarter carbs (no bigger than a computer mouse)  Cut down on sweet beverages. This includes juice, soda, and sweet tea.   Drink at least 1 glass of water  with each meal and aim for at least 8 glasses per day  Exercise at least 150 minutes every week.

## 2023-08-08 NOTE — Assessment & Plan Note (Signed)
 At goal on atenolol  25 mg daily and losartan  25 mg daily. Apparently had a high reading at the [facility] a few weeks ago.  Discussed that it is normal for blood pressure to fluctuate.  He will continue to monitor at home and let us  know if persistently elevated.

## 2023-08-08 NOTE — Progress Notes (Signed)
   William Norman is a 83 y.o. male who presents today for an office visit.  Assessment/Plan:  Chronic Problems Addressed Today: BPH associated with nocturia We will take over prescription for his Flomax .  Will send this into the pharmacy today.  He has also on finasteride  5 mg daily.  Does not need refill on this today.  Lumbar radiculopathy Worse in the last few months.  No red flags.  Advised him to follow back up with sports medicine soon.  He will call to schedule appointment.  Essential hypertension At goal on atenolol  25 mg daily and losartan  25 mg daily. Apparently had a high reading at the [facility] a few weeks ago.  Discussed that it is normal for blood pressure to fluctuate.  He will continue to monitor at home and let us  know if persistently elevated.     Subjective:  HPI:  See Assessment / plan for status of chronic conditions.  Patient here today to discuss Flomax .  He has been on this for a while though this previously was filled by the [facility].  Recently changed insurance and has found that would be cheaper for him to get from a retail pharmacy and like for us  to take this over.  He has been tolerating well without any significant side effects.  He has also had ongoing issues with neck and back pain.  This has been a persistent issue for him for many years and he did have a lumbar laminectomy a couple of years ago.  Did well with this initially however over the last few months symptoms do seem to be worsening.  He saw sports medicine for this about a year ago as well.       Objective:  Physical Exam: BP 101/64   Pulse 75   Temp (!) 97.2 F (36.2 C) (Temporal)   Ht 6' (1.829 m)   Wt 213 lb 6.4 oz (96.8 kg)   SpO2 95%   BMI 28.94 kg/m   Gen: No acute distress, resting comfortably CV: Irregular rate and rhythm with no murmurs appreciated Pulm: Normal work of breathing, clear to auscultation bilaterally with no crackles, wheezes, or rhonchi Neuro: Grossly normal, moves  all extremities Psych: Normal affect and thought content      Vonnie Spagnolo M. Kennyth, MD 08/08/2023 2:06 PM

## 2023-08-08 NOTE — Assessment & Plan Note (Signed)
 Worse in the last few months.  No red flags.  Advised him to follow back up with sports medicine soon.  He will call to schedule appointment.

## 2023-08-08 NOTE — Assessment & Plan Note (Signed)
 We will take over prescription for his Flomax .  Will send this into the pharmacy today.  He has also on finasteride  5 mg daily.  Does not need refill on this today.

## 2023-08-09 ENCOUNTER — Telehealth: Payer: Self-pay | Admitting: Family Medicine

## 2023-08-09 NOTE — Telephone Encounter (Signed)
 Pt stated when he was getting this medication through the [place], it was increased to 2 x day. Now it is going through his other insurance. Dr Kennyth only ordered 90 pills for 1 x day.   RX: tamsulosin  (FLOMAX ) 0.4 MG CAPS capsule   PHARM:  [id number] [address] [place], [place] - [address] Phone: [phone]  Fax: [phone]      Please update

## 2023-08-09 NOTE — Telephone Encounter (Signed)
**Note De-identified  [place] Obfuscation** Please advise 

## 2023-08-10 ENCOUNTER — Other Ambulatory Visit: Payer: Self-pay | Admitting: *Deleted

## 2023-08-10 MED ORDER — TAMSULOSIN HCL 0.4 MG PO CAPS: 0.8000 mg | ORAL_CAPSULE | Freq: Every day | ORAL | 3 refills | Status: DC

## 2023-08-10 NOTE — Telephone Encounter (Signed)
 Ok to send in new prescription for Flomax  0.4 mg take 2 tablets daily dispense #180 with 3 refills.  Worth HERO. Kennyth, MD 08/10/2023 7:46 AM

## 2023-08-10 NOTE — Telephone Encounter (Signed)
 Rx [facility]  [facility]

## 2023-08-12 ENCOUNTER — Other Ambulatory Visit (HOSPITAL_BASED_OUTPATIENT_CLINIC_OR_DEPARTMENT_OTHER): Payer: Self-pay | Admitting: Cardiology

## 2023-08-12 DIAGNOSIS — I10 Essential (primary) hypertension: Secondary | ICD-10-CM

## 2023-08-13 ENCOUNTER — Other Ambulatory Visit: Payer: Self-pay | Admitting: Family Medicine

## 2023-08-16 DIAGNOSIS — R338 Other retention of urine: Secondary | ICD-10-CM | POA: Diagnosis not present

## 2023-08-16 DIAGNOSIS — N401 Enlarged prostate with lower urinary tract symptoms: Secondary | ICD-10-CM | POA: Diagnosis not present

## 2023-08-18 ENCOUNTER — Other Ambulatory Visit: Payer: Self-pay | Admitting: Family Medicine

## 2023-08-18 DIAGNOSIS — I10 Essential (primary) hypertension: Secondary | ICD-10-CM

## 2023-08-21 ENCOUNTER — Encounter: Payer: Self-pay | Admitting: Family Medicine

## 2023-08-21 ENCOUNTER — Ambulatory Visit: Admitting: Family Medicine

## 2023-08-21 VITALS — BP 122/82 | HR 74 | Ht 72.0 in | Wt 215.0 lb

## 2023-08-21 DIAGNOSIS — M545 Low back pain, unspecified: Secondary | ICD-10-CM | POA: Diagnosis not present

## 2023-08-21 DIAGNOSIS — M47816 Spondylosis without myelopathy or radiculopathy, lumbar region: Secondary | ICD-10-CM

## 2023-08-21 NOTE — Progress Notes (Signed)
   I, Leotis Batter, CMA acting as a scribe for Artist Lloyd, MD.  William Norman is a 83 y.o. male who presents to [facility] today for exacerbation of his LBP. Pt was last seen by Dr. Lloyd on 09/07/22 and lumbar facet injections were ordered.  Today, pt reports exacerbation of lower back sx. Doesn't remember getting much relief with back injection. Sx are bilateral. Intermittent radicular sx into the gluteal region and hips. Denies night disturbance.   Radiating pain: denies leg pain LE numbness/tingling: B LE, L>R LE weakness: L LE Aggravates: movement Treatments tried: Acetaminophen   Dx imaging: 07/10/21 L-spine MRI             06/07/21 L knee XR (done @ [facility]) 03/18/21 L-spine XR 12/20/20 L-spine MRI             02/26/20 L-spine XR  Pertinent review of systems: No fevers or chills  Relevant historical information: Atrial fibrillation history of stroke.   Exam:  BP 122/82   Pulse 74   Ht 6' (1.829 m)   Wt 215 lb (97.5 kg)   SpO2 97%   BMI 29.16 kg/m  General: Well Developed, well nourished, and in no acute distress.   MSK: L-spine: Normal. Nontender palpation spinal midline decreased lumbar motion.    Lab and Radiology Results    Assessment and Plan: 83 y.o. male with chronic low back pain.  Patient has chronic facet arthritis seen on lumbar spine MRI from Jul 10, 2021 and again on CT scan of the pelvis December 2024.  He had good results with left-sided facet injection at L4-L5 and L5-S1 July 2024.  Plan for same injection today left facet L4-5 and L5-S1.  If this does not work well enough would do contralateral right side facet injections L4-5 and L5-S1.   PDMP not reviewed this encounter. Orders Placed This Encounter  Procedures   DG FACET JT INJ L /S SINGLE LEVEL LEFT W/FL/CT    LEFT L4-5 and L5-S1    Standing Status:   Future    Expiration Date:   09/21/2023    Reason for Exam (SYMPTOM  OR DIAGNOSIS  REQUIRED):   low back pain    Preferred Imaging Location?:   [facility]   DG FACET JT INJ L /S  2ND LEVEL LEFT W/FL/CT    LEFT L4-5 and L5-S1    Standing Status:   Future    Expiration Date:   09/21/2023    Reason for Exam (SYMPTOM  OR DIAGNOSIS REQUIRED):   low back pain    Preferred Imaging Location?:   [facility]   No orders of the defined types were placed in this encounter.    Discussed warning signs or symptoms. Please see discharge instructions. Patient expresses understanding.   The above documentation has been reviewed and is accurate and complete Artist Lloyd, M.D.

## 2023-08-21 NOTE — Patient Instructions (Addendum)
 Thank you for coming in today.   Please call [facility]) at [phone] to schedule your spine injection.    Let me know how this goes  Reminder: Dr. Joane will be out of the office starting August 1st, for about 6 weeks

## 2023-08-23 ENCOUNTER — Ambulatory Visit

## 2023-08-23 VITALS — Ht 72.0 in | Wt 215.0 lb

## 2023-08-23 DIAGNOSIS — Z0000 Encounter for general adult medical examination without abnormal findings: Secondary | ICD-10-CM | POA: Diagnosis not present

## 2023-08-23 NOTE — Progress Notes (Signed)
 [place]   Subjective:   William Norman is a 83 y.o. who presents for a Medicare Wellness preventive visit.  As a reminder, Annual Wellness Visits don't include a physical exam, and some assessments may be limited, especially if this visit is performed virtually. We may recommend an in-person follow-up visit with your provider if needed.  Visit Complete: Virtual I connected with  William Norman on 08/23/23 by a audio enabled telemedicine application and verified that I am speaking with the correct person using two identifiers.  Patient Location: Home  Provider Location: Home Office  I discussed the limitations of evaluation and management by telemedicine. The patient expressed understanding and agreed to proceed.  Vital Signs: Because this visit was a virtual/telehealth visit, some criteria may be missing or patient reported. Any vitals not documented were not able to be obtained and vitals that have been documented are patient reported.  VideoDeclined- This patient declined Librarian, academic. Therefore the visit was completed with audio only.  Persons Participating in Visit: Patient.  AWV Questionnaire: No: Patient Medicare AWV questionnaire was not completed prior to this visit.  Cardiac Risk Factors include: advanced age (>68men, >52 women);dyslipidemia;hypertension;male gender     Objective:    Today's Vitals   08/23/23 1446  Weight: 215 lb (97.5 kg)  Height: 6' (1.829 m)  PainSc: 7    Body mass index is 29.16 kg/m.     08/23/2023    2:49 PM 11/04/2022   12:59 PM 10/31/2022    9:00 AM 10/31/2022    2:32 AM 08/15/2022    1:23 PM 12/22/2021    5:57 PM 08/06/2021    3:06 PM  Advanced Directives  Does Patient Have a Medical Advance Directive? Yes No No No No No No  Type of Estate agent of [place];Living will        Copy of Healthcare Power of Attorney in Chart? No - copy requested        Would patient like  information on creating a medical advance directive?   No - Patient declined  No - Patient declined  No - Patient declined    Current Medications (verified) Outpatient Encounter Medications as of 08/23/2023  Medication Sig   acetaminophen  (TYLENOL ) 500 MG tablet Take 1,500 mg by mouth every morning.   apixaban  (ELIQUIS ) 5 MG TABS tablet Take 1 tablet (5 mg total) by mouth 2 (two) times daily.   atenolol  (TENORMIN ) 25 MG tablet TAKE 1 TABLET (25 MG TOTAL) BY MOUTH DAILY.   atorvastatin  (LIPITOR) 40 MG tablet TAKE 1/2 TABLET BY MOUTH DAILY   Coenzyme Q10 (CO Q 10 PO) Take 1 capsule by mouth in the morning.   Cyanocobalamin  (VITAMIN B-12) 1000 MCG SUBL Place 1 tablet (1,000 mcg total) under the tongue daily.   finasteride  (PROSCAR ) 5 MG tablet Take 1 tablet (5 mg total) by mouth daily.   furosemide  (LASIX ) 20 MG tablet TAKE 1 TABLET BY MOUTH EVERY DAY   hydroxypropyl methylcellulose / hypromellose (ISOPTO TEARS / GONIOVISC) 2.5 % ophthalmic solution Place 1 drop into both eyes in the morning.   losartan  (COZAAR ) 25 MG tablet TAKE 1 TABLET (25 MG TOTAL) BY MOUTH DAILY.   nitroGLYCERIN  (NITROSTAT ) 0.4 MG SL tablet Dissolve 1 tablet under the tongue every 5 minutes as needed (Patient taking differently: Place 0.4 mg under the tongue every 5 (five) minutes as needed for chest pain.)   tamsulosin  (FLOMAX ) 0.4 MG CAPS capsule Take 2 capsules (0.8 mg total)  by mouth daily.   triamcinolone  (NASACORT ) 55 MCG/ACT AERO nasal inhaler Place 1 spray into the nose daily. Start with 1 spray each side twice a day for 3 days, then reduce to daily until sinus symptoms are better.   No facility-administered encounter medications on file as of 08/23/2023.    Allergies (verified) Nitrous oxide and Simvastatin   History: Past Medical History:  Diagnosis Date   BPH (benign prostatic hypertrophy)    BPH associated with nocturia 08/29/2013   BPH with obstruction/lower urinary tract symptoms 08/14/2019   CAD (coronary  artery disease)    CAD (coronary artery disease), native coronary artery    PTCA of RCA 1986, PTCA of LAD 1992,  Negative treadmill Cardiolite 2011    Chronic dermatitis of hands 10/12/2015   Chronic venous insufficiency    Coagulation disorder (HCC) 10/26/2018   Diverticulitis    DVT (deep venous thrombosis) (HCC)    Dyslipidemia    Dysrhythmia    Eczema    Erectile dysfunction 07/11/2012   Essential hypertension    GERD 08/04/2006       GERD (gastroesophageal reflux disease)    Hyperglycemia 07/08/2019   Hyperlipidemia    Hypertension    Incomplete emptying of bladder 08/14/2019   Long term current use of anticoagulant therapy    Lumbar disc disease    Overweight (BMI 25.0-29.9) 06/18/2015   Persistent atrial fibrillation (HCC) 04/10/2018   Personal history of DVT (deep vein thrombosis)    Initially in 1998 with recurrence in 2002 now on chronic warfarin    Soft tissue lesion of foot 08/27/2015   Stroke [facility])    Urinary tract infection with hematuria 08/14/2019   Past Surgical History:  Procedure Laterality Date   APPLICATION OF WOUND VAC  09/02/2020   Procedure: APPLICATION OF WOUND VAC;  Surgeon: Belinda Cough, MD;  Location: [facility] OR;  Service: General;;   CARDIAC CATHETERIZATION     CARPAL TUNNEL RELEASE  04/21/2011   Procedure: CARPAL TUNNEL RELEASE;  Surgeon: Franky JONELLE Curia, MD;  Location: [facility];  Service: Orthopedics;  Laterality: Left;   CARPAL TUNNEL RELEASE  05/23/2011   Procedure: CARPAL TUNNEL RELEASE;  Surgeon: Franky JONELLE Curia, MD;  Location: [facility];  Service: Orthopedics;  Laterality: Right;   COLON SURGERY  2010 and 2011 colostomy reversal   CORONARY ANGIOPLASTY     EYE SURGERY     repair of macular hole   IR RADIOLOGIST EVAL & MGMT  01/27/2023   LAPAROTOMY N/A 09/02/2020   Procedure: EXPLORATORY LAPAROTOMY, LYSIS OF ADHESIONS ,BOWEL RESECTION;  Surgeon: Belinda Cough, MD;  Location: [facility] OR;  Service: General;  Laterality: N/A;    LUMBAR LAMINECTOMY/DECOMPRESSION MICRODISCECTOMY Left 03/18/2021   Procedure: LAMINOTOMY, FORAMINOTOMY, LEFT LUMBAR THREE-FOUR;  Surgeon: Lanis Pupa, MD;  Location: [facility] OR;  Service: Neurosurgery;  Laterality: Left;   NASAL SEPTUM SURGERY     precutaneous transluminal coronary angioplasty     right hernia     TONSILLECTOMY AND ADENOIDECTOMY     Family History  Problem Relation Age of Onset   Heart disease Mother    Atrial fibrillation Mother    Stroke Mother    Heart failure Mother    Colitis Mother    Mitral valve prolapse Mother    Heart disease Father    Heart attack Father    Atrial fibrillation Father    Heart failure Father    COPD Father    Varicose Veins Father    Osteoporosis  Sister    Irritable bowel syndrome Sister    AAA (abdominal aortic aneurysm) Brother    Diverticulitis Brother    Social History   Socioeconomic History   Marital status: Married    Spouse name: Not on file   Number of children: 3   Years of education: 16   Highest education level: Some college, no degree  Occupational History   Occupation: [employer]  Tobacco Use   Smoking status: Former    Types: Cigarettes   Smokeless tobacco: Never  Vaping Use   Vaping status: Never Used  Substance and Sexual Activity   Alcohol use: Yes    Comment: once a month   Drug use: No   Sexual activity: Not on file  Other Topics Concern   Not on file  Social History Narrative   Fun: Working outside in yard, cutting wood    Denies abuse and feels safe at home.    Right handed   Lives at home with wife, daughter, granddaughter, and son (periodically)   Social Drivers of Health   Financial Resource Strain: Low Risk  (08/06/2023)   Overall Financial Resource Strain (CARDIA)    Difficulty of Paying Living Expenses: Not hard at all  Food Insecurity: No Food Insecurity (08/06/2023)   Hunger Vital Sign    Worried About Running Out of Food in the Last Year: Never true    Ran Out of Food in the Last Year:  Never true  Transportation Needs: No Transportation Needs (08/06/2023)   PRAPARE - Administrator, Civil Service (Medical): No    Lack of Transportation (Non-Medical): No  Physical Activity: Inactive (08/23/2023)   Exercise Vital Sign    Days of Exercise per Week: 0 days    Minutes of Exercise per Session: 0 min  Stress: Stress Concern Present (08/23/2023)   [employer] of Occupational Health - Occupational Stress Questionnaire    Feeling of Stress: To some extent  Social Connections: Moderately Isolated (08/23/2023)   Social Connection and Isolation Panel    Frequency of Communication with Friends and Family: More than three times a week    Frequency of Social Gatherings with Friends and Family: More than three times a week    Attends Religious Services: Never    Database administrator or Organizations: No    Attends Engineer, structural: Never    Marital Status: Married    Tobacco Counseling Counseling given: Not Answered    Clinical Intake:  Pre-visit preparation completed: Yes  Pain : 0-10 Pain Score: 7  Pain Type: Chronic pain Pain Location: Back Pain Descriptors / Indicators: Aching Pain Onset: More than a month ago Pain Frequency: Constant     BMI - recorded: 29.16 Nutritional Status: BMI 25 -29 Overweight Nutritional Risks: None Diabetes: No  Lab Results  Component Value Date   HGBA1C 5.8 05/23/2023   HGBA1C 5.9 09/06/2022   HGBA1C 5.9 08/24/2021     How often do you need to have someone help you when you read instructions, pamphlets, or other written materials from your doctor or pharmacy?: 1 - Never  Interpreter Needed?: No  Information entered by :: Ellouise Haws, LPN   Activities of Daily Living     08/23/2023    2:47 PM 10/31/2022    9:00 AM  In your present state of health, do you have any difficulty performing the following activities:  Hearing? 0 0  Vision? 0 0  Difficulty concentrating or making decisions? 0 0  Walking or climbing stairs? 0 0  Dressing or bathing? 0 0  Doing errands, shopping? 0 0  Preparing Food and eating ? N   Using the Toilet? N   In the past six months, have you accidently leaked urine? Y   Comment at times   Do you have problems with loss of bowel control? N   Managing your Medications? N   Managing your Finances? N   Housekeeping or managing your Housekeeping? N     Patient Care Team: Kennyth Worth HERO, MD as PCP - General (Family Medicine) Lonni Slain, MD as PCP - Cardiology (Cardiology) Blanca Elsie RAMAN, MD as Consulting Physician (Cardiology) Roz Anes, MD as Consulting Physician (Ophthalmology) Monetta Redell PARAS, MD as Consulting Physician (Cardiology) Janit Thresa HERO, DPM as Consulting Physician (Podiatry) Dow Maxwell, PT as Physical Therapist (Physical Therapy) Nicholaus Sherlean CROME, [facility] (Inactive) as Pharmacist (Pharmacist) Lanis Pupa, MD as Consulting Physician (Neurosurgery)  I have updated your Care Teams any recent Medical Services you may have received from other providers in the past year.     Assessment:   This is a routine wellness examination for William Norman.  Hearing/Vision screen Hearing Screening - Comments:: Pt denies any hearing issues  Vision Screening - Comments:: Wears rx glasses - [facility] [place]   Goals Addressed             This Visit's Progress    Patient Stated       Maintain health and activity        Depression Screen     08/23/2023    2:49 PM 08/08/2023    2:04 PM 01/26/2023   11:45 AM 09/06/2022    2:11 PM 08/15/2022    1:21 PM 03/10/2022   12:56 PM 01/04/2022   11:28 AM  PHQ 2/9 Scores  PHQ - 2 Score 1 1 2  0 1 0 2  PHQ- 9 Score 2 4 8  0 2  8    Fall Risk     08/23/2023    2:51 PM 08/08/2023    1:36 PM 01/26/2023   11:46 AM 09/06/2022    2:11 PM 08/08/2022    1:48 PM  Fall Risk   Falls in the past year? 0 0 0 0 0  Number falls in past yr: 0 0 0 0 0   Injury with Fall? 0 0 0 0 0  Risk for fall due to : Impaired balance/gait No Fall Risks No Fall Risks Impaired balance/gait;No Fall Risks Impaired vision;Impaired balance/gait;Impaired mobility  Follow up Falls prevention discussed    Falls prevention discussed    MEDICARE RISK AT HOME:  Medicare Risk at Home Any stairs in or around the home?: Yes If so, are there any without handrails?: No Home free of loose throw rugs in walkways, pet beds, electrical cords, etc?: Yes Adequate lighting in your home to reduce risk of falls?: Yes Life alert?: No Use of a cane, walker or w/c?: Yes Grab bars in the bathroom?: Yes Shower chair or bench in shower?: No Elevated toilet seat or a handicapped toilet?: No  TIMED UP AND GO:  Was the test performed?  No  Cognitive Function: 6CIT completed    05/03/2021   10:24 AM 07/13/2017    2:40 PM 06/09/2015   12:31 PM  MMSE - Mini Mental State Exam  Not completed:  -- --  Orientation to time 5    Orientation to Place 5  Registration 3    Attention/ Calculation 5    Recall 3    Language- name 2 objects 2    Language- repeat 1    Language- follow 3 step command 3    Language- read & follow direction 1    Write a sentence 1    Copy design 1    Total score 30          08/23/2023    2:52 PM 08/15/2022    1:26 PM 08/06/2021    3:08 PM 07/13/2017    2:36 PM  6CIT Screen  What Year? 0 points 0 points 0 points 0 points  What month? 0 points 0 points 0 points 0 points  What time? 0 points 0 points 0 points 0 points  Count back from 20 0 points 0 points 0 points 0 points  Months in reverse 0 points 0 points 0 points 0 points  Repeat phrase 0 points 4 points 0 points 0 points  Total Score 0 points 4 points 0 points 0 points    Immunizations Immunization History  Administered Date(s) Administered   Fluad Quad(high Dose 65+) 10/15/2018, 09/29/2021   Influenza Split 02/14/2005, 11/20/2007, 11/14/2009, 11/16/2012, 10/10/2013, 10/21/2014,  10/12/2015, 11/23/2017   Influenza Whole 02/14/2005, 11/20/2007, 11/14/2009   Influenza, High Dose Seasonal PF 10/21/2014, 10/12/2015, 11/23/2017, 10/15/2018, 11/06/2019, 09/28/2022   Influenza,inj,Quad PF,6+ Mos 10/10/2013   Influenza-Unspecified 02/21/2008, 11/16/2012, 10/10/2013, 10/21/2014, 10/12/2015, 11/23/2017, 11/16/2020, 10/15/2021, 10/23/2022   Moderna Covid-19 Fall Seasonal Vaccine 10yrs & older 11/17/2021, 01/07/2022, 10/12/2022   Novel Infuenza-h1n1-09 02/21/2008   PFIZER Comirnaty(Gray Top)Covid-19 Tri-Sucrose Vaccine 05/20/2020   PFIZER(Purple Top)SARS-COV-2 Vaccination 03/01/2019, 03/21/2019, 11/11/2019, 05/20/2020   PNEUMOCOCCAL CONJUGATE-20 03/20/2021   Pfizer Covid-19 Vaccine Bivalent Booster 65yrs & up 11/23/2020, 08/08/2021   Pneumococcal Conjugate-13 10/21/2014   Pneumococcal Polysaccharide-23 02/15/2003, 02/18/2008   Respiratory Syncytial Virus Vaccine,Recomb Aduvanted(Arexvy) 04/23/2022   Td 02/15/2004, 10/21/2014   Tdap 02/15/2004, 10/21/2014, 08/08/2023   Unspecified SARS-COV-2 Vaccination 11/17/2021   Zoster Recombinant(Shingrix) 04/15/2019, 06/17/2019   Zoster, Live 03/09/2010   Zoster, Unspecified 04/15/2019, 06/17/2019    Screening Tests Health Maintenance  Topic Date Due   COVID-19 Vaccine (10 - Pfizer risk 2024-25 season) 08/24/2023 (Originally 04/14/2023)   INFLUENZA VACCINE  09/15/2023   Medicare Annual Wellness (AWV)  08/22/2024   DTaP/Tdap/Td (6 - Td or Tdap) 08/07/2033   Pneumococcal Vaccine: 50+ Years  Completed   Zoster Vaccines- Shingrix  Completed   Hepatitis B Vaccines  Aged Out   HPV VACCINES  Aged Out   Meningococcal B Vaccine  Aged Out    Health Maintenance  There are no preventive care reminders to display for this patient.  Health Maintenance Items Addressed: See Nurse Notes at the end of this note  Additional Screening:  Vision Screening: Recommended annual ophthalmology exams for early detection of glaucoma and other  disorders of the eye. Would you like a referral to an eye doctor? No    Dental Screening: Recommended annual dental exams for proper oral hygiene  Community Resource Referral / Chronic Care Management: CRR required this visit?  No   CCM required this visit?  No   Plan:    I have personally reviewed and noted the following in the patient's chart:   Medical and social history Use of alcohol, tobacco or illicit drugs  Current medications and supplements including opioid prescriptions. Patient is not currently taking opioid prescriptions. Functional ability and status Nutritional status Physical activity Advanced directives List of other physicians Hospitalizations, surgeries, and  ER visits in previous 12 months Vitals Screenings to include cognitive, depression, and falls Referrals and appointments  In addition, I have reviewed and discussed with patient certain preventive protocols, quality metrics, and best practice recommendations. A written personalized care plan for preventive services as well as general preventive health recommendations were provided to patient.   Ellouise VEAR Haws, LPN   2/0/7974   After Visit Summary: (MyChart) Due to this being a telephonic visit, the after visit summary with patients personalized plan was offered to patient via MyChart   Notes: Nothing significant to report at this time.

## 2023-08-23 NOTE — Patient Instructions (Signed)
 Mr. William Norman , Thank you for taking time out of your busy schedule to complete your Annual Wellness Visit with me. I enjoyed our conversation and look forward to speaking with you again next year. I, as well as your care team,  appreciate your ongoing commitment to your health goals. Please review the following plan we discussed and let me know if I can assist you in the future. Your Game plan/ To Do List    Referrals: If you haven't heard from the office you've been referred to, please reach out to them at the phone provided.   Follow up Visits: Next Medicare AWV with our clinical staff: 08/27/24   Have you seen your provider in the last 6 months (3 months if uncontrolled diabetes)? Yes Next Office Visit with your provider: will follow up with Appt   Clinician Recommendations:  Aim for 30 minutes of exercise or brisk walking, 6-8 glasses of water , and 5 servings of fruits and vegetables each day.       This is a list of the screening recommended for you and due dates:  Health Maintenance  Topic Date Due   Medicare Annual Wellness Visit  08/15/2023   COVID-19 Vaccine (10 - Pfizer risk 2024-25 season) 08/24/2023*   Flu Shot  09/15/2023   DTaP/Tdap/Td vaccine (5 - Td or Tdap) 10/20/2024   Pneumococcal Vaccine for age over 49  Completed   Zoster (Shingles) Vaccine  Completed   Hepatitis B Vaccine  Aged Out   HPV Vaccine  Aged Out   Meningitis B Vaccine  Aged Out  *Topic was postponed. The date shown is not the original due date.    Advanced directives: (Copy Requested) Please bring a copy of your health care power of attorney and living will to the office to be added to your chart at your convenience. You can mail to [facility] [address] [place], [place] [address] or email to [email or web address] Advance Care Planning is important because it:  [x]  Makes sure you receive the medical care that is consistent with your values, goals, and preferences  [x]  It  provides guidance to your family and loved ones and reduces their decisional burden about whether or not they are making the right decisions based on your wishes.  Follow the link provided in your after visit summary or read over the paperwork we have mailed to you to help you started getting your Advance Directives in place. If you need assistance in completing these, please reach out to us  so that we can help you!  See attachments for Preventive Care and Fall Prevention Tips.

## 2023-08-31 ENCOUNTER — Encounter: Payer: Self-pay | Admitting: Family Medicine

## 2023-08-31 ENCOUNTER — Other Ambulatory Visit: Payer: Self-pay | Admitting: Urology

## 2023-08-31 DIAGNOSIS — N401 Enlarged prostate with lower urinary tract symptoms: Secondary | ICD-10-CM

## 2023-09-04 ENCOUNTER — Encounter: Payer: Self-pay | Admitting: Podiatry

## 2023-09-04 ENCOUNTER — Ambulatory Visit: Admitting: Podiatry

## 2023-09-04 VITALS — Ht 72.0 in | Wt 215.0 lb

## 2023-09-04 DIAGNOSIS — M79674 Pain in right toe(s): Secondary | ICD-10-CM

## 2023-09-04 DIAGNOSIS — B351 Tinea unguium: Secondary | ICD-10-CM

## 2023-09-04 DIAGNOSIS — M79675 Pain in left toe(s): Secondary | ICD-10-CM | POA: Diagnosis not present

## 2023-09-04 NOTE — Progress Notes (Signed)
   Chief Complaint  Patient presents with   Nail Problem    RFC    SUBJECTIVE Patient presents to office today complaining of elongated, thickened nails that cause pain while ambulating in shoes.  Patient is unable to trim their own nails.  Patient states that he was just into the office on 05/03/2022 with another physician and he was unsatisfied with the nail debridement.  Presenting today to have them reevaluated.   Past Medical History:  Diagnosis Date   BPH (benign prostatic hypertrophy)    BPH associated with nocturia 08/29/2013   BPH with obstruction/lower urinary tract symptoms 08/14/2019   CAD (coronary artery disease)    CAD (coronary artery disease), native coronary artery    PTCA of RCA 1986, PTCA of LAD 1992,  Negative treadmill Cardiolite 2011    Chronic dermatitis of hands 10/12/2015   Chronic venous insufficiency    Coagulation disorder (HCC) 10/26/2018   Diverticulitis    DVT (deep venous thrombosis) (HCC)    Dyslipidemia    Dysrhythmia    Eczema    Erectile dysfunction 07/11/2012   Essential hypertension    GERD 08/04/2006       GERD (gastroesophageal reflux disease)    Hyperglycemia 07/08/2019   Hyperlipidemia    Hypertension    Incomplete emptying of bladder 08/14/2019   Long term current use of anticoagulant therapy    Lumbar disc disease    Overweight (BMI 25.0-29.9) 06/18/2015   Persistent atrial fibrillation (HCC) 04/10/2018   Personal history of DVT (deep vein thrombosis)    Initially in 1998 with recurrence in 2002 now on chronic warfarin    Soft tissue lesion of foot 08/27/2015   Stroke (HCC)    Urinary tract infection with hematuria 08/14/2019    Allergies  Allergen Reactions   Nitrous Oxide Other (See Comments)    Pt reports he passed out and almost died after inhaling gas through mask at dentist office    Simvastatin Other (See Comments)    Muscle aches      OBJECTIVE General Patient is awake, alert, and oriented x 3 and in no  acute distress. Derm Skin is dry and supple bilateral. Negative open lesions or macerations. Remaining integument unremarkable. Nails are tender, long, thickened and dystrophic with subungual debris, consistent with onychomycosis, 1-5 bilateral. No signs of infection noted. Vasc  DP and PT pedal pulses palpable bilaterally. Temperature gradient within normal limits.  Neuro Epicritic and protective threshold sensation grossly intact bilaterally.  Musculoskeletal Exam No symptomatic pedal deformities noted bilateral. Muscular strength within normal limits.  ASSESSMENT 1.  Pain due to onychomycosis of toenails both  PLAN OF CARE 1. Patient evaluated today.  2. Instructed to maintain good pedal hygiene and foot care.  3. Mechanical debridement of nails 1-5 bilaterally performed using a nail nipper. Filed with dremel without incident.  4. Return to clinic in 3 mos.    Thresa EMERSON Sar, DPM [facility]  Dr. Thresa EMERSON Sar, DPM    [address] [place], [place] [address]                Office [phone]  Fax [phone]

## 2023-09-04 NOTE — Discharge Instructions (Signed)

## 2023-09-05 ENCOUNTER — Inpatient Hospital Stay: Admission: RE | Admit: 2023-09-05 | Source: Ambulatory Visit

## 2023-09-05 ENCOUNTER — Inpatient Hospital Stay
Admission: RE | Admit: 2023-09-05 | Discharge: 2023-09-05 | Disposition: A | Discharge status: Home / Self Care | Source: Ambulatory Visit | Attending: Family Medicine | Admitting: Family Medicine

## 2023-09-19 NOTE — Discharge Instructions (Signed)

## 2023-09-20 ENCOUNTER — Ambulatory Visit
Admission: RE | Admit: 2023-09-20 | Discharge: 2023-09-20 | Disposition: A | Discharge status: Home / Self Care | Source: Ambulatory Visit | Attending: Family Medicine | Admitting: Family Medicine

## 2023-09-20 DIAGNOSIS — M545 Low back pain, unspecified: Secondary | ICD-10-CM | POA: Diagnosis not present

## 2023-09-20 DIAGNOSIS — M47816 Spondylosis without myelopathy or radiculopathy, lumbar region: Secondary | ICD-10-CM

## 2023-09-20 MED ORDER — IOPAMIDOL (ISOVUE-M 200) INJECTION 41%
1.0000 mL | Freq: Once | INTRAMUSCULAR | Status: AC
  Administered 2023-09-20: 1 mL via INTRA_ARTICULAR

## 2023-09-20 MED ORDER — METHYLPREDNISOLONE ACETATE 40 MG/ML INJ SUSP (RADIOLOG
80.0000 mg | Freq: Once | INTRAMUSCULAR | Status: AC
  Administered 2023-09-20: 80 mg via INTRA_ARTICULAR

## 2023-10-04 ENCOUNTER — Ambulatory Visit
Admission: RE | Admit: 2023-10-04 | Discharge: 2023-10-04 | Disposition: A | Discharge status: Home / Self Care | Source: Ambulatory Visit | Attending: Urology | Admitting: Urology

## 2023-10-04 DIAGNOSIS — R338 Other retention of urine: Secondary | ICD-10-CM | POA: Diagnosis not present

## 2023-10-04 DIAGNOSIS — N401 Enlarged prostate with lower urinary tract symptoms: Secondary | ICD-10-CM

## 2023-10-04 HISTORY — PX: IR RADIOLOGIST EVAL & MGMT: IMG5224

## 2023-10-11 NOTE — Consult Note (Signed)
 Chief Complaint: BPH with LUTS  Referring Provider(s): Bell,Eugene D III    Patient Status: [facility]--Outpatient  History of Present Illness: William Norman is a 83 y.o. male With a longstanding history benign prostatic hypertrophy and lower urinary tract symptoms. He was previously seen by my partner Dr. Ami Bellman on 01/27/2023 for similar issues. The current underlying problem for the patient is the fact that on anticoagulation (Eliquis ) and the last time he came anticoagulation for procedure he had a stroke. Since that time the patient has been seen IR and urology and has made syndrome plans for undergoing procedures, however continues to change plans as he is very reluctant to stop his anticoagulation for fear of stroke. Patient now states that his symptoms have become so severe that he has decided he will need to take chance coming off of anticoagulation in order to be treated. IPSS score 20. The patient does perform straight cath in at night, however this is becoming more problematic and he thinks he is finally ready to have minimally invasive therapy in order to decrease the overall time needed to be off of anticoagulation.   The procedure, risks, benefits, and alternatives were discussed in detail with patient. Specifically prostatic artery embolization with risks of bleeding, ischemic trauma to the bladder, rectum, or venous, infection, or nontarget embolization. Verbal consent was obtained. Written consent will be obtained on the day of the procedure. The procedure will be performed under moderate sedation. The patient is in agreement with this plan.    Past Medical History:  Diagnosis Date   BPH (benign prostatic hypertrophy)    BPH associated with nocturia 08/29/2013   BPH with obstruction/lower urinary tract symptoms 08/14/2019   CAD (coronary artery disease)    CAD (coronary artery disease), native coronary artery    PTCA of RCA 1986, PTCA of LAD 1992,  Negative  treadmill Cardiolite 2011    Chronic dermatitis of hands 10/12/2015   Chronic venous insufficiency    Coagulation disorder (HCC) 10/26/2018   Diverticulitis    DVT (deep venous thrombosis) (HCC)    Dyslipidemia    Dysrhythmia    Eczema    Erectile dysfunction 07/11/2012   Essential hypertension    GERD 08/04/2006       GERD (gastroesophageal reflux disease)    Hyperglycemia 07/08/2019   Hyperlipidemia    Hypertension    Incomplete emptying of bladder 08/14/2019   Long term current use of anticoagulant therapy    Lumbar disc disease    Overweight (BMI 25.0-29.9) 06/18/2015   Persistent atrial fibrillation (HCC) 04/10/2018   Personal history of DVT (deep vein thrombosis)    Initially in 1998 with recurrence in 2002 now on chronic warfarin    Soft tissue lesion of foot 08/27/2015   Stroke [facility])    Urinary tract infection with hematuria 08/14/2019    Past Surgical History:  Procedure Laterality Date   APPLICATION OF WOUND VAC  09/02/2020   Procedure: APPLICATION OF WOUND VAC;  Surgeon: Belinda Cough, MD;  Location: [facility] OR;  Service: General;;   CARDIAC CATHETERIZATION     CARPAL TUNNEL RELEASE  04/21/2011   Procedure: CARPAL TUNNEL RELEASE;  Surgeon: Franky JONELLE Curia, MD;  Location: [facility];  Service: Orthopedics;  Laterality: Left;   CARPAL TUNNEL RELEASE  05/23/2011   Procedure: CARPAL TUNNEL RELEASE;  Surgeon: Franky JONELLE Curia, MD;  Location: [facility];  Service: Orthopedics;  Laterality: Right;   COLON SURGERY  2010 and 2011  colostomy reversal   CORONARY ANGIOPLASTY     EYE SURGERY     repair of macular hole   IR RADIOLOGIST EVAL & MGMT  01/27/2023   LAPAROTOMY N/A 09/02/2020   Procedure: EXPLORATORY LAPAROTOMY, LYSIS OF ADHESIONS ,BOWEL RESECTION;  Surgeon: Belinda Cough, MD;  Location: [facility] OR;  Service: General;  Laterality: N/A;   LUMBAR LAMINECTOMY/DECOMPRESSION MICRODISCECTOMY Left 03/18/2021   Procedure: LAMINOTOMY, FORAMINOTOMY, LEFT LUMBAR  THREE-FOUR;  Surgeon: Lanis Pupa, MD;  Location: [facility] OR;  Service: Neurosurgery;  Laterality: Left;   NASAL SEPTUM SURGERY     precutaneous transluminal coronary angioplasty     right hernia     TONSILLECTOMY AND ADENOIDECTOMY      Allergies: Nitrous oxide and Simvastatin  Medications: Prior to Admission medications   Medication Sig Start Date End Date Taking? Authorizing Provider  acetaminophen  (TYLENOL ) 500 MG tablet Take 1,500 mg by mouth every morning.    [provider]  apixaban  (ELIQUIS ) 5 MG TABS tablet Take 1 tablet (5 mg total) by mouth 2 (two) times daily. 03/20/21   Lanis Pupa, MD  atenolol  (TENORMIN ) 25 MG tablet TAKE 1 TABLET (25 MG TOTAL) BY MOUTH DAILY. 08/21/23   Kennyth Worth HERO, MD  atorvastatin  (LIPITOR) 40 MG tablet TAKE 1/2 TABLET BY MOUTH DAILY 08/14/23   Kennyth Worth HERO, MD  Coenzyme Q10 (CO Q 10 PO) Take 1 capsule by mouth in the morning.    [provider]  Cyanocobalamin  (VITAMIN B-12) 1000 MCG SUBL Place 1 tablet (1,000 mcg total) under the tongue daily. 07/24/20   Kennyth Worth HERO, MD  finasteride  (PROSCAR ) 5 MG tablet Take 1 tablet (5 mg total) by mouth daily. 01/26/23   Kennyth Worth HERO, MD  furosemide  (LASIX ) 20 MG tablet TAKE 1 TABLET BY MOUTH EVERY DAY 08/14/23   Lonni Slain, MD  hydroxypropyl methylcellulose / hypromellose (ISOPTO TEARS / GONIOVISC) 2.5 % ophthalmic solution Place 1 drop into both eyes in the morning.    [provider]  losartan  (COZAAR ) 25 MG tablet TAKE 1 TABLET (25 MG TOTAL) BY MOUTH DAILY. 05/22/23   Kennyth Worth HERO, MD  nitroGLYCERIN  (NITROSTAT ) 0.4 MG SL tablet Dissolve 1 tablet under the tongue every 5 minutes as needed Patient taking differently: Place 0.4 mg under the tongue every 5 (five) minutes as needed for chest pain. 03/09/21   Monetta Redell PARAS, MD  tamsulosin  (FLOMAX ) 0.4 MG CAPS capsule Take 2 capsules (0.8 mg total) by mouth daily. 08/10/23   Kennyth Worth HERO, MD  triamcinolone   (NASACORT ) 55 MCG/ACT AERO nasal inhaler Place 1 spray into the nose daily. Start with 1 spray each side twice a day for 3 days, then reduce to daily until sinus symptoms are better. 08/24/22   Lucius Krabbe, NP     Family History  Problem Relation Age of Onset   Heart disease Mother    Atrial fibrillation Mother    Stroke Mother    Heart failure Mother    Colitis Mother    Mitral valve prolapse Mother    Heart disease Father    Heart attack Father    Atrial fibrillation Father    Heart failure Father    COPD Father    Varicose Veins Father    Osteoporosis Sister    Irritable bowel syndrome Sister    AAA (abdominal aortic aneurysm) Brother    Diverticulitis Brother     Social History   Socioeconomic History   Marital status: Married    Spouse name: Not  on file   Number of children: 3   Years of education: 16   Highest education level: Some college, no degree  Occupational History   Occupation: [employer]  Tobacco Use   Smoking status: Former    Types: Cigarettes   Smokeless tobacco: Never  Vaping Use   Vaping status: Never Used  Substance and Sexual Activity   Alcohol use: Yes    Comment: once a month   Drug use: No   Sexual activity: Not on file  Other Topics Concern   Not on file  Social History Narrative   Fun: Working outside in yard, cutting wood    Denies abuse and feels safe at home.    Right handed   Lives at home with wife, daughter, granddaughter, and son (periodically)   Social Drivers of Health   Financial Resource Strain: Low Risk  (08/06/2023)   Overall Financial Resource Strain (CARDIA)    Difficulty of Paying Living Expenses: Not hard at all  Food Insecurity: No Food Insecurity (08/06/2023)   Hunger Vital Sign    Worried About Running Out of Food in the Last Year: Never true    Ran Out of Food in the Last Year: Never true  Transportation Needs: No Transportation Needs (08/06/2023)   PRAPARE - Administrator, Civil Service (Medical):  No    Lack of Transportation (Non-Medical): No  Physical Activity: Inactive (08/23/2023)   Exercise Vital Sign    Days of Exercise per Week: 0 days    Minutes of Exercise per Session: 0 min  Stress: Stress Concern Present (08/23/2023)   [employer] of Occupational Health - Occupational Stress Questionnaire    Feeling of Stress: To some extent  Social Connections: Moderately Isolated (08/23/2023)   Social Connection and Isolation Panel    Frequency of Communication with Friends and Family: More than three times a week    Frequency of Social Gatherings with Friends and Family: More than three times a week    Attends Religious Services: Never    Database administrator or Organizations: No    Attends Banker Meetings: Never    Marital Status: Married     Review of Systems: A 12 point ROS discussed and pertinent positives are indicated in the HPI above.  All other systems are negative.  Review of Systems  Genitourinary:  Positive for frequency and urgency.  All other systems reviewed and are negative.   Vital Signs: BP 114/69 (BP Location: Left Arm, Patient Position: Sitting, Cuff Size: Normal)   Pulse 83   Temp 97.9 F (36.6 C) (Oral)   Resp 18   SpO2 96%     Physical Exam HENT:     Head: Normocephalic and atraumatic.  Cardiovascular:     Rate and Rhythm: Normal rate and regular rhythm.     Heart sounds: Normal heart sounds.  Pulmonary:     Effort: Pulmonary effort is normal.     Breath sounds: Normal breath sounds.  Skin:    General: Skin is warm and dry.  Psychiatric:        Mood and Affect: Mood normal.        Behavior: Behavior normal.        Thought Content: Thought content normal.        Judgment: Judgment normal.     Imaging: DG FACET JT INJ L /S  2ND LEVEL LEFT W/FL/CT Result Date: 09/20/2023 CLINICAL DATA:  83 year old male with history of low back pain  status post multiple prior spinal injections. Request for left L4-L5 and left L5-S1 facet  joint injections. EXAM: DG FACET JT INJ L OR S SPINE SECOND LEVEL UNILATERAL; DG FACET JT INJ L OR S SPINE SINGLE LEVEL UNILATERAL PROCEDURE: The procedure, risks, benefits, and alternatives were explained to the patient. Questions regarding the procedure were encouraged and answered. The patient understands and consents to the procedure. Left L4-L5 FACET INJECTION: A posterior oblique approach was taken to the facet on the left at L4-L5 using a curved 22 gauge spinal needle. Intra-articular positioning was confirmed by injecting a small amount of Isovue -M 200. No vascular opacification is seen. Forty mg of Depo-Medrol  mixed with 2 mL 0.5 % bupivacaine  were instilled into the joint. The injection resulted in concordant pain. Right L5-S1 FACET INJECTION: A posterior oblique approach was taken to the facet on the right at L5-S1 using a curved 22 gauge spinal needle. Intra-articular positioning was confirmed by injecting a small amount of Isovue -M 200. No vascular opacification is seen. Forty mg of Depo-Medrol  mixed with 2 mL 0.5% bupivacaine  were instilled into the joint. The injection resulted in concordant pain. The procedure was well-tolerated. IMPRESSION: 1. Technically successful left L4-L5 facet injection #1. 2. Technically successful left L5-S1 facet injection #1. Ester Sides, MD [facility] Radiology Electronically Signed   By: Ester Sides M.D.   On: 09/20/2023 14:27   DG FACET JT INJ L /S SINGLE LEVEL LEFT W/FL/CT Result Date: 09/20/2023 CLINICAL DATA:  83 year old male with history of low back pain status post multiple prior spinal injections. Request for left L4-L5 and left L5-S1 facet joint injections. EXAM: DG FACET JT INJ L OR S SPINE SECOND LEVEL UNILATERAL; DG FACET JT INJ L OR S SPINE SINGLE LEVEL UNILATERAL PROCEDURE: The procedure, risks, benefits, and alternatives were explained to the patient. Questions regarding the procedure were encouraged  and answered. The patient understands and consents to the procedure. Left L4-L5 FACET INJECTION: A posterior oblique approach was taken to the facet on the left at L4-L5 using a curved 22 gauge spinal needle. Intra-articular positioning was confirmed by injecting a small amount of Isovue -M 200. No vascular opacification is seen. Forty mg of Depo-Medrol  mixed with 2 mL 0.5 % bupivacaine  were instilled into the joint. The injection resulted in concordant pain. Right L5-S1 FACET INJECTION: A posterior oblique approach was taken to the facet on the right at L5-S1 using a curved 22 gauge spinal needle. Intra-articular positioning was confirmed by injecting a small amount of Isovue -M 200. No vascular opacification is seen. Forty mg of Depo-Medrol  mixed with 2 mL 0.5% bupivacaine  were instilled into the joint. The injection resulted in concordant pain. The procedure was well-tolerated. IMPRESSION: 1. Technically successful left L4-L5 facet injection #1. 2. Technically successful left L5-S1 facet injection #1. Ester Sides, MD [facility] Radiology Electronically Signed   By: Ester Sides M.D.   On: 09/20/2023 14:27    Labs:  CBC: Recent Labs    10/31/22 0304 11/01/22 0703 05/23/23 1330  WBC 11.9* 8.3 7.4  HGB 15.0 13.9 14.6  HCT 45.8 41.9 44.4  PLT 144* 150 197.0    COAGS: Recent Labs    10/31/22 0304  INR 1.2  APTT 33    BMP: Recent Labs    10/31/22 0304 11/01/22 0703 11/02/22 0744 05/23/23 1330  NA 135 135 137 139  K 3.6 3.4* 3.7 4.1  CL 105 107 107 104  CO2 19* 22 22  27  GLUCOSE 143* 96 88 89  BUN 20 9 8 17   CALCIUM  8.8* 8.3* 8.9 9.4  CREATININE 1.00 0.78 0.71 0.73  GFRNONAA >60 >60 >60  --     LIVER FUNCTION TESTS: Recent Labs    10/31/22 0304 11/01/22 0703 05/23/23 1330  BILITOT 1.8* 0.9 1.2  AST 26 26 24   ALT 21 21 16   ALKPHOS 47 43 54  PROT 6.3* 5.6* 6.9  ALBUMIN 3.4* 2.7* 4.2    TUMOR MARKERS: No results for  input(s): AFPTM, CEA, CA199, CHROMGRNA in the last 8760 hours.  Assessment and Plan: Symptomatic benign prostatic hypertrophy in a patient interested in minimally invasive therapy. I will obtain a CTA pelvis with IV contrast to evaluate the anatomy of the prostatic artery and pelvic vasculature, obtain a BMP and CBC within 30 days prior to scheduling a prostatic artery embolization which will be performed likely at [facility] with moderate sedation to be scheduled after the CTA has been evaluated.   Electronically Signed: Cordella DELENA Banner, MD   10/11/2023, 9:52 AM     I spent a total of  30 Minutes   in face to face in clinical consultation, greater than 50% of which was counseling/coordinating care for prostatic hypertrophy with lower urinary tract symptoms decreasing quality of life.

## 2023-10-12 ENCOUNTER — Other Ambulatory Visit: Payer: Self-pay

## 2023-10-12 DIAGNOSIS — N401 Enlarged prostate with lower urinary tract symptoms: Secondary | ICD-10-CM

## 2023-10-13 ENCOUNTER — Ambulatory Visit
Admission: EM | Admit: 2023-10-13 | Discharge: 2023-10-13 | Disposition: A | Discharge status: Home / Self Care | Attending: Family Medicine | Admitting: Family Medicine

## 2023-10-13 ENCOUNTER — Encounter: Payer: Self-pay | Admitting: Emergency Medicine

## 2023-10-13 ENCOUNTER — Other Ambulatory Visit: Payer: Self-pay

## 2023-10-13 DIAGNOSIS — S0083XA Contusion of other part of head, initial encounter: Secondary | ICD-10-CM

## 2023-10-13 DIAGNOSIS — N401 Enlarged prostate with lower urinary tract symptoms: Secondary | ICD-10-CM

## 2023-10-13 NOTE — ED Triage Notes (Signed)
 Patient c/o bruising under right eye since yesterday.  No apparent injury.  Left eye some blurry vision and no visual disturbance in right eye.  Patient does not wear contacts.

## 2023-10-13 NOTE — Discharge Instructions (Signed)
 You have bruising to the face that could have come from a minor irritation. This is very easy to elicit when you are taking a medication like Eliquis . If you develop fever, pain, more swelling then that is a different issue and would like to see you back for that. For now, continue taking your Eliquis  and do not irritate this particular bruising as your body will resorb and process this.

## 2023-10-13 NOTE — ED Provider Notes (Signed)
 [facility]  Note:  This document was prepared using Conservation officer, historic buildings and may include unintentional dictation errors.  MRN: [id number] DOB: 1940-10-10  Subjective:   William Norman is a 83 y.o. male presenting for 1 day history of bruising to the right lower eyelid.  No fever, trauma, vision disturbance, drainage of pus or active bleeding.  Patient is on chronic anticoagulation with Eliquis .     No current facility-administered medications for this encounter.  Current Outpatient Medications:    acetaminophen  (TYLENOL ) 500 MG tablet, Take 1,500 mg by mouth every morning., Disp: , Rfl:    apixaban  (ELIQUIS ) 5 MG TABS tablet, Take 1 tablet (5 mg total) by mouth 2 (two) times daily., Disp: 60 tablet, Rfl: 11   atenolol  (TENORMIN ) 25 MG tablet, TAKE 1 TABLET (25 MG TOTAL) BY MOUTH DAILY., Disp: 90 tablet, Rfl: 1   atorvastatin  (LIPITOR) 40 MG tablet, TAKE 1/2 TABLET BY MOUTH DAILY, Disp: 45 tablet, Rfl: 3   Coenzyme Q10 (CO Q 10 PO), Take 1 capsule by mouth in the morning., Disp: , Rfl:    Cyanocobalamin  (VITAMIN B-12) 1000 MCG SUBL, Place 1 tablet (1,000 mcg total) under the tongue daily., Disp: 90 tablet, Rfl: 0   finasteride  (PROSCAR ) 5 MG tablet, Take 1 tablet (5 mg total) by mouth daily., Disp: 90 tablet, Rfl: 3   furosemide  (LASIX ) 20 MG tablet, TAKE 1 TABLET BY MOUTH EVERY DAY, Disp: 90 tablet, Rfl: 0   hydroxypropyl methylcellulose / hypromellose (ISOPTO TEARS / GONIOVISC) 2.5 % ophthalmic solution, Place 1 drop into both eyes in the morning., Disp: , Rfl:    losartan  (COZAAR ) 25 MG tablet, TAKE 1 TABLET (25 MG TOTAL) BY MOUTH DAILY., Disp: 90 tablet, Rfl: 3   nitroGLYCERIN  (NITROSTAT ) 0.4 MG SL tablet, Dissolve 1 tablet under the tongue every 5 minutes as needed (Patient taking differently: Place 0.4 mg under the tongue every 5 (five) minutes as needed for chest pain.), Disp: 90 tablet, Rfl: 1   tamsulosin  (FLOMAX ) 0.4 MG CAPS capsule,  Take 2 capsules (0.8 mg total) by mouth daily., Disp: 180 capsule, Rfl: 3   triamcinolone  (NASACORT ) 55 MCG/ACT AERO nasal inhaler, Place 1 spray into the nose daily. Start with 1 spray each side twice a day for 3 days, then reduce to daily until sinus symptoms are better., Disp: 1 each, Rfl: 2   Allergies  Allergen Reactions   Nitrous Oxide Other (See Comments)    Pt reports he passed out and almost died after inhaling gas through mask at dentist office    Simvastatin Other (See Comments)    Muscle aches     Past Medical History:  Diagnosis Date   BPH (benign prostatic hypertrophy)    BPH associated with nocturia 08/29/2013   BPH with obstruction/lower urinary tract symptoms 08/14/2019   CAD (coronary artery disease)    CAD (coronary artery disease), native coronary artery    PTCA of RCA 1986, PTCA of LAD 1992,  Negative treadmill Cardiolite 2011    Chronic dermatitis of hands 10/12/2015   Chronic venous insufficiency    Coagulation disorder (HCC) 10/26/2018   Diverticulitis    DVT (deep venous thrombosis) (HCC)    Dyslipidemia    Dysrhythmia    Eczema    Erectile dysfunction 07/11/2012   Essential hypertension    GERD 08/04/2006       GERD (gastroesophageal reflux disease)    Hyperglycemia 07/08/2019   Hyperlipidemia    Hypertension  Incomplete emptying of bladder 08/14/2019   Long term current use of anticoagulant therapy    Lumbar disc disease    Overweight (BMI 25.0-29.9) 06/18/2015   Persistent atrial fibrillation (HCC) 04/10/2018   Personal history of DVT (deep vein thrombosis)    Initially in 1998 with recurrence in 2002 now on chronic warfarin    Soft tissue lesion of foot 08/27/2015   Stroke [facility])    Urinary tract infection with hematuria 08/14/2019     Past Surgical History:  Procedure Laterality Date   APPLICATION OF WOUND VAC  09/02/2020   Procedure: APPLICATION OF WOUND VAC;  Surgeon: Belinda Cough, MD;  Location: [facility] OR;  Service: General;;    CARDIAC CATHETERIZATION     CARPAL TUNNEL RELEASE  04/21/2011   Procedure: CARPAL TUNNEL RELEASE;  Surgeon: Franky JONELLE Curia, MD;  Location: [facility];  Service: Orthopedics;  Laterality: Left;   CARPAL TUNNEL RELEASE  05/23/2011   Procedure: CARPAL TUNNEL RELEASE;  Surgeon: Franky JONELLE Curia, MD;  Location: [facility];  Service: Orthopedics;  Laterality: Right;   COLON SURGERY  2010 and 2011 colostomy reversal   CORONARY ANGIOPLASTY     EYE SURGERY     repair of macular hole   IR RADIOLOGIST EVAL & MGMT  01/27/2023   LAPAROTOMY N/A 09/02/2020   Procedure: EXPLORATORY LAPAROTOMY, LYSIS OF ADHESIONS ,BOWEL RESECTION;  Surgeon: Belinda Cough, MD;  Location: [facility] OR;  Service: General;  Laterality: N/A;   LUMBAR LAMINECTOMY/DECOMPRESSION MICRODISCECTOMY Left 03/18/2021   Procedure: LAMINOTOMY, FORAMINOTOMY, LEFT LUMBAR THREE-FOUR;  Surgeon: Lanis Pupa, MD;  Location: [facility] OR;  Service: Neurosurgery;  Laterality: Left;   NASAL SEPTUM SURGERY     precutaneous transluminal coronary angioplasty     right hernia     TONSILLECTOMY AND ADENOIDECTOMY      Family History  Problem Relation Age of Onset   Heart disease Mother    Atrial fibrillation Mother    Stroke Mother    Heart failure Mother    Colitis Mother    Mitral valve prolapse Mother    Heart disease Father    Heart attack Father    Atrial fibrillation Father    Heart failure Father    COPD Father    Varicose Veins Father    Osteoporosis Sister    Irritable bowel syndrome Sister    AAA (abdominal aortic aneurysm) Brother    Diverticulitis Brother     Social History   Tobacco Use   Smoking status: Former    Types: Cigarettes   Smokeless tobacco: Never  Vaping Use   Vaping status: Never Used  Substance Use Topics   Alcohol use: Yes    Comment: once a month   Drug use: No    ROS   Objective:   Vitals: BP 133/75 (BP Location: Left Arm)   Pulse 71   Temp 98.2 F (36.8 C) (Oral)   Resp 18    SpO2 94%   Physical Exam Constitutional:      General: He is not in acute distress.    Appearance: Normal appearance. He is well-developed and normal weight. He is not ill-appearing, toxic-appearing or diaphoretic.  HENT:     Head: Normocephalic and atraumatic.     Right Ear: External ear normal.     Left Ear: External ear normal.     Nose: Nose normal.     Mouth/Throat:     Pharynx: Oropharynx is clear.  Eyes:     General: Lids  are everted, no foreign bodies appreciated. No scleral icterus.       Right eye: No foreign body, discharge or hordeolum.        Left eye: No foreign body, discharge or hordeolum.     Extraocular Movements: Extraocular movements intact.     Conjunctiva/sclera:     Right eye: Right conjunctiva is not injected. No chemosis, exudate or hemorrhage.    Left eye: Left conjunctiva is not injected. No chemosis, exudate or hemorrhage.  Cardiovascular:     Rate and Rhythm: Normal rate.  Pulmonary:     Effort: Pulmonary effort is normal.  Musculoskeletal:     Cervical back: Normal range of motion.  Neurological:     Mental Status: He is alert and oriented to person, place, and time.  Psychiatric:        Mood and Affect: Mood normal.        Behavior: Behavior normal.        Thought Content: Thought content normal.        Judgment: Judgment normal.     Assessment and Plan :   PDMP not reviewed this encounter.  1. Traumatic ecchymosis of face, initial encounter    Suspect minor undifferentiated trauma to the right lower eyelid the precipitated the ecchymosis which resolved more likely as he is treated with Eliquis .  No signs of an ophthalmologic emergency.  Counseled patient on potential for adverse effects with medications prescribed/recommended today, ER and return-to-clinic precautions discussed, patient verbalized understanding.    Christopher Savannah, [place] 10/13/23 1752

## 2023-10-25 ENCOUNTER — Ambulatory Visit: Admitting: Family Medicine

## 2023-10-25 VITALS — BP 144/72 | HR 68 | Ht 72.0 in | Wt 213.0 lb

## 2023-10-25 DIAGNOSIS — M21372 Foot drop, left foot: Secondary | ICD-10-CM

## 2023-10-25 DIAGNOSIS — Z7409 Other reduced mobility: Secondary | ICD-10-CM | POA: Diagnosis not present

## 2023-10-25 DIAGNOSIS — R251 Tremor, unspecified: Secondary | ICD-10-CM | POA: Diagnosis not present

## 2023-10-25 MED ORDER — AMBULATORY NON FORMULARY MEDICATION: 0 refills | Status: AC

## 2023-10-25 NOTE — Progress Notes (Unsigned)
   LILLETTE Ileana Collet, PhD, LAT, ATC acting as a scribe for Artist Lloyd, MD.  William Norman is a 83 y.o. male who presents to [facility] today for hip pain and balance problems. Pt was last seen by Dr. Lloyd on LBP.  Today, pt c/o L hip pain and balance problems worsening. He notes difficulty w/ his balance. No falls noted. Pt locates pain to all over the hip joint.   He notes a tremor in his L UE and the L-side of his face. He says this has been doing it for awhile, now.  Of note, L foot drop continues, which seems to exacerbation the balance issues.   Pertinent review of systems: No fevers or chills  Relevant historical information: Chronic left foot drop due to lumbar spinal stenosis.  Does not have an AFO.   Exam:  BP (!) 144/72   Pulse 68   Ht 6' (1.829 m)   Wt 213 lb (96.6 kg)   SpO2 95%   BMI 28.89 kg/m  General: Well Developed, well nourished, and in no acute distress.   MSK: Left lower extremity strength is reduced left foot dorsiflexion significantly with the foot drop.  Neuro: Alert and oriented.  Tremor at rest involving left hand.  Cogwheeling and some rigidity is present.  Gait is broad-based and a bit shuffling with a foot drop gait.  En bloc turn.  Patient uses a cane to ambulate.       Assessment and Plan: 83 y.o. male with impaired gait tremor balance problem and foot drop.  Cause of impaired balance is multifactorial.  He does have a foot drop which is contribution to his symptoms.  The foot drop is a chronic ongoing issue.  We talked about AFOs before and he has not been interested.  At this point I do think it is necessary as it will improve his gait.  Will write a order for an AFO.  Additionally he has a worsening left-sided tremor that is concerning for Parkinson's disease.  He does have some cogwheeling and broad-based gait.  Plan to refer to neurology.  Lastly will refer to neuro PT for balance and gait  training.  Recommend using a walker at home at least for a while.   PDMP not reviewed this encounter. Orders Placed This Encounter  Procedures   Ambulatory referral to Neurology    Referral Priority:   Routine    Referral Type:   Consultation    Referral Reason:   Specialty Services Required    Requested Specialty:   Neurology    Number of Visits Requested:   1   Ambulatory Referral to Neuro Rehab    Referral Priority:   Routine    Referral Type:   Consultation    Number of Visits Requested:   1   Meds ordered this encounter  Medications   AMBULATORY NON FORMULARY MEDICATION    Sig: AFO- left Dispense 1 Dx code: F78.627 Use as needed    Dispense:  1 Device    Refill:  0     Discussed warning signs or symptoms. Please see discharge instructions. Patient expresses understanding.   The above documentation has been reviewed and is accurate and complete Artist Lloyd, M.D.

## 2023-10-25 NOTE — Patient Instructions (Addendum)
 Thank you for coming in today.   I've referred you to Neurology.  Let us  know if you don't hear from them in one week.   You should hear from the [facility] about getting the splint for in your shoe.  I've referred you to Neuro Rehab.  Let us  know if you don't hear from them in one week.

## 2023-10-26 DIAGNOSIS — M21372 Foot drop, left foot: Secondary | ICD-10-CM | POA: Insufficient documentation

## 2023-10-27 ENCOUNTER — Ambulatory Visit
Admission: RE | Admit: 2023-10-27 | Discharge: 2023-10-27 | Disposition: A | Discharge status: Home / Self Care | Source: Ambulatory Visit

## 2023-10-27 DIAGNOSIS — I745 Embolism and thrombosis of iliac artery: Secondary | ICD-10-CM | POA: Diagnosis not present

## 2023-10-27 DIAGNOSIS — N401 Enlarged prostate with lower urinary tract symptoms: Secondary | ICD-10-CM

## 2023-10-27 MED ORDER — IOPAMIDOL (ISOVUE-370) INJECTION 76%
100.0000 mL | Freq: Once | INTRAVENOUS | Status: AC | PRN
  Administered 2023-10-27: 100 mL via INTRAVENOUS

## 2023-10-31 ENCOUNTER — Encounter: Payer: Self-pay | Admitting: Family Medicine

## 2023-11-01 ENCOUNTER — Other Ambulatory Visit: Payer: Self-pay | Admitting: *Deleted

## 2023-11-01 MED ORDER — COVID-19 MRNA VACC (MODERNA) 50 MCG/0.5ML IM SUSP: 0.5000 mL | Freq: Once | INTRAMUSCULAR | 0 refills | Status: AC

## 2023-11-07 ENCOUNTER — Encounter: Payer: Self-pay | Admitting: Neurology

## 2023-11-07 ENCOUNTER — Other Ambulatory Visit (HOSPITAL_COMMUNITY): Payer: Self-pay

## 2023-11-07 DIAGNOSIS — N401 Enlarged prostate with lower urinary tract symptoms: Secondary | ICD-10-CM

## 2023-11-08 ENCOUNTER — Other Ambulatory Visit: Payer: Self-pay

## 2023-11-08 ENCOUNTER — Telehealth: Payer: Self-pay

## 2023-11-08 ENCOUNTER — Encounter: Payer: Self-pay | Admitting: Physical Therapy

## 2023-11-08 ENCOUNTER — Ambulatory Visit: Attending: Family Medicine | Admitting: Physical Therapy

## 2023-11-08 DIAGNOSIS — M6281 Muscle weakness (generalized): Secondary | ICD-10-CM | POA: Insufficient documentation

## 2023-11-08 DIAGNOSIS — R2681 Unsteadiness on feet: Secondary | ICD-10-CM | POA: Diagnosis not present

## 2023-11-08 DIAGNOSIS — R2689 Other abnormalities of gait and mobility: Secondary | ICD-10-CM | POA: Insufficient documentation

## 2023-11-08 NOTE — Telephone Encounter (Signed)
 Pharmacy please advise on holding Eliquis  for 2 days (4 doses) prior to  PROSTATIC ARTERY EMBOLIZATION  scheduled for TBD. Last labs (CBC and BMET) 10/13/2023. Thank you.

## 2023-11-08 NOTE — Telephone Encounter (Signed)
   Pre-operative Risk Assessment    Patient Name: William Norman  DOB: 02-Aug-1940 MRN: [id number]   Date of last office visit: 09/27/22 SHELDA BRUCKNER, MD Date of next office visit: NONE   Request for Surgical Clearance    Procedure:  PROSTATIC ARTERY EMBOLIZATION  Date of Surgery:  Clearance TBD                                Surgeon:  NOT INDICATED Surgeon's Group or Practice Name:  [facility] Phone number:  [phone] Fax number:  [phone]   Type of Clearance Requested:   - Medical  - Pharmacy:  Hold Apixaban  (Eliquis ) 4 DOSES BEFORE   Type of Anesthesia:  Not Indicated   Additional requests/questions:    Signed, Lucie DELENA Ku   11/08/2023, 11:52 AM

## 2023-11-08 NOTE — Telephone Encounter (Signed)
 Left message to call back and schedule an appt in office for preop clearance.

## 2023-11-08 NOTE — Telephone Encounter (Signed)
   Name: William Norman  DOB: [birth year]  MRN: [id number]  Primary Cardiologist: Shelda Bruckner, MD  Chart reviewed as part of pre-operative protocol coverage. Because of William Norman's past medical history and time since last visit, he will require a follow-up in-office visit in order to better assess preoperative cardiovascular risk.  Pre-op covering staff: - Please schedule appointment and call patient to inform them. If patient already had an upcoming appointment within acceptable timeframe, please add pre-op clearance to the appointment notes so provider is aware. - Please contact requesting surgeon's office via preferred method (i.e, phone, fax) to inform them of need for appointment prior to surgery.  This message will also be routed to pharmacy pool  for input on holding Eliquis  as requested below so that this information is available to the clearing provider at time of patient's appointment.   William Satterfield, NP  11/08/2023, 1:19 PM

## 2023-11-08 NOTE — Therapy (Signed)
 OUTPATIENT PHYSICAL THERAPY NEURO EVALUATION   Patient Name: William Norman MRN: [id number] DOB:02-May-1940, 83 y.o., male Today's Date: 11/09/2023   PCP: Kennyth Worth HERO, MD  REFERRING PROVIDER: Joane Artist RAMAN, MD   END OF SESSION:  PT End of Session - 11/09/23 1712     Visit Number 1    Number of Visits 13    Date for Recertification  12/22/23    Authorization Type [employer]    Progress Note Due on Visit 10    PT Start Time 1450    PT Stop Time 1530    PT Time Calculation (min) 40 min    Activity Tolerance Patient tolerated treatment well    Behavior During Therapy [facility] for tasks assessed/performed          Past Medical History:  Diagnosis Date   BPH (benign prostatic hypertrophy)    BPH associated with nocturia 08/29/2013   BPH with obstruction/lower urinary tract symptoms 08/14/2019   CAD (coronary artery disease)    CAD (coronary artery disease), native coronary artery    PTCA of RCA 1986, PTCA of LAD 1992,  Negative treadmill Cardiolite 2011    Chronic dermatitis of hands 10/12/2015   Chronic venous insufficiency    Coagulation disorder 10/26/2018   Diverticulitis    DVT (deep venous thrombosis) (HCC)    Dyslipidemia    Dysrhythmia    Eczema    Erectile dysfunction 07/11/2012   Essential hypertension    GERD 08/04/2006       GERD (gastroesophageal reflux disease)    Hyperglycemia 07/08/2019   Hyperlipidemia    Hypertension    Incomplete emptying of bladder 08/14/2019   Long term current use of anticoagulant therapy    Lumbar disc disease    Overweight (BMI 25.0-29.9) 06/18/2015   Persistent atrial fibrillation (HCC) 04/10/2018   Personal history of DVT (deep vein thrombosis)    Initially in 1998 with recurrence in 2002 now on chronic warfarin    Soft tissue lesion of foot 08/27/2015   Stroke [facility])    Urinary tract infection with hematuria 08/14/2019   Past Surgical History:  Procedure Laterality Date   APPLICATION OF WOUND VAC   09/02/2020   Procedure: APPLICATION OF WOUND VAC;  Surgeon: Belinda Cough, MD;  Location: [facility] OR;  Service: General;;   CARDIAC CATHETERIZATION     CARPAL TUNNEL RELEASE  04/21/2011   Procedure: CARPAL TUNNEL RELEASE;  Surgeon: Franky JONELLE Curia, MD;  Location: [facility];  Service: Orthopedics;  Laterality: Left;   CARPAL TUNNEL RELEASE  05/23/2011   Procedure: CARPAL TUNNEL RELEASE;  Surgeon: Franky JONELLE Curia, MD;  Location: [facility];  Service: Orthopedics;  Laterality: Right;   COLON SURGERY  2010 and 2011 colostomy reversal   CORONARY ANGIOPLASTY     EYE SURGERY     repair of macular hole   IR RADIOLOGIST EVAL & MGMT  01/27/2023   LAPAROTOMY N/A 09/02/2020   Procedure: EXPLORATORY LAPAROTOMY, LYSIS OF ADHESIONS ,BOWEL RESECTION;  Surgeon: Belinda Cough, MD;  Location: [facility] OR;  Service: General;  Laterality: N/A;   LUMBAR LAMINECTOMY/DECOMPRESSION MICRODISCECTOMY Left 03/18/2021   Procedure: LAMINOTOMY, FORAMINOTOMY, LEFT LUMBAR THREE-FOUR;  Surgeon: Lanis Pupa, MD;  Location: [facility] OR;  Service: Neurosurgery;  Laterality: Left;   NASAL SEPTUM SURGERY     precutaneous transluminal coronary angioplasty     right hernia     TONSILLECTOMY AND ADENOIDECTOMY     Patient Active Problem List   Diagnosis Date Noted  Left foot drop 10/26/2023   Age-related cognitive decline 04/17/2023   Bilateral pseudophakia 04/17/2023   Chronic diastolic (congestive) heart failure (HCC) 04/17/2023   Chronic low back pain 04/17/2023   Disorder of prostate, unspecified 04/17/2023   Dry eye syndrome of bilateral lacrimal glands 04/17/2023   History of small bowel obstruction 04/17/2023   Intervertebral disc disorders with radiculopathy, lumbosacral region 04/17/2023   Localized swelling, mass and lump, left lower limb 04/17/2023   Neoplasm of uncertain behavior of skin 04/17/2023   Puckering of macula, left eye 04/17/2023   Retention of urine, unspecified 04/17/2023   Tremor  01/26/2023   UTI (urinary tract infection) 11/01/2022   SIRS (systemic inflammatory response syndrome) (HCC) 10/31/2022   Permanent atrial fibrillation (HCC) 03/29/2022   Secondary hypercoagulable state 03/29/2022   History of CVA (cerebrovascular accident) 03/29/2022   Depression, major, single episode, mild 08/24/2021   Osteoarthritis 11/20/2020   Constipation 10/02/2020   Protein-calorie malnutrition, severe 09/03/2020   SBO (small bowel obstruction) (HCC) 08/28/2020   AAA (abdominal aortic aneurysm) 03/06/2020   History of ischemic stroke 02/16/2020   DDD (degenerative disc disease), lumbosacral 02/11/2020   Recurrent UTI 08/14/2019   Hyperglycemia 07/08/2019   Persistent atrial fibrillation (HCC) 04/10/2018   Chronic dermatitis of hands 10/12/2015   Stroke (HCC) 06/18/2015   BPH associated with nocturia 08/29/2013   CAD (coronary artery disease), native coronary artery    Long term current use of anticoagulant    Personal history of DVT (deep vein thrombosis)    Chronic venous insufficiency    Erectile dysfunction 07/11/2012   Lumbar radiculopathy    Dyslipidemia    Essential hypertension     ONSET DATE: 10/25/2023 (MD referral)  REFERRING DIAG:  M21.372 (ICD-10-CM) - Left foot drop  Z74.09 (ICD-10-CM) - Impaired functional mobility, balance, gait, and endurance  R25.1 (ICD-10-CM) - Tremor    THERAPY DIAG:  Unsteadiness on feet  Muscle weakness (generalized)  Other abnormalities of gait and mobility  Rationale for Evaluation and Treatment: Rehabilitation  SUBJECTIVE:                                                                                                                                                                                             SUBJECTIVE STATEMENT: It's my back, hip, memory. I have had at least one fall and tripped over door.  Fell onto the floor  and had a hard time getting up. Probably L foot drop has been going on for 2 years.   Referred for a brace appointment (on [employer]), coming up. Pt accompanied by: self  PERTINENT HISTORY: Chronic left foot drop  due to lumbar spinal stenosis. Does not have an AFO, LUE and L face tremor  PAIN:  Are you having pain? Yes: NPRS scale: 5/10 Pain location: low back  Pain description: not real bad Aggravating factors: Unsure Relieving factors: unsure  PRECAUTIONS: Fall  RED FLAGS: None   WEIGHT BEARING RESTRICTIONS: No  FALLS: Has patient fallen in last 6 months? Yes. Number of falls 1  LIVING ENVIRONMENT: Lives with: lives with their family and lives with their spouse Lives in: House/apartment Stairs: 1 step to enter; steps to basement with rails Has following equipment at home: Single point cane  PLOF: Independent and Leisure: enjoys yardwork  PATIENT GOALS: Agreeable to working on balance  OBJECTIVE:  Note: Objective measures were completed at Evaluation unless otherwise noted.  DIAGNOSTIC FINDINGS: NA for this episode  COGNITION: Overall cognitive status: reports some memory issues   SENSATION: Light touch: Impaired on LLE and distally on LLE  COORDINATION: Unable to tap LLE; equal (both slightly slowed heel to shin)  POSTURE: rounded shoulders, forward head, and resting tremor LUE and face  LOWER EXTREMITY ROM:     Active  Right Eval Left Eval  Hip flexion    Hip extension    Hip abduction    Hip adduction    Hip internal rotation    Hip external rotation    Knee flexion -20 -20  Knee extension    Ankle dorsiflexion +5 A/ROM A/ROM neutral P/ROM + 5  Ankle plantarflexion    Ankle inversion    Ankle eversion     (Blank rows = not tested)  LOWER EXTREMITY MMT:    MMT Right Eval Left Eval  Hip flexion 4 3+  Hip extension    Hip abduction    Hip adduction    Hip internal rotation    Hip external rotation    Knee flexion 4 3+  Knee extension 4 4  Ankle dorsiflexion 3+ 1  Ankle plantarflexion 3+ 3  Ankle inversion     Ankle eversion    (Blank rows = not tested)  TRANSFERS: Sit to stand: Modified independence  Assistive device utilized: BUE support     Stand to sit: Modified independence  Assistive device utilized: BUE support      GAIT: Findings: Gait Characteristics: step through pattern, decreased ankle dorsiflexion- Left, Left steppage, and poor foot clearance- Left, Distance walked: 50 ft, Assistive device utilized:cane, and Level of assistance: SBA  FUNCTIONAL TESTS:  5 times sit to stand: 26.46 sec with BUE support Timed up and go (TUG): 25.44 sec no cane, unsteadiness with turns 10 meter walk test: 16.6 sec = 1.98 ft/sec Berg Balance test:32/56 (Scores <45/56 indicate increased fall risk)  [facility] PT Assessment - 11/08/23 1512       Standardized Balance Assessment   Standardized Balance Assessment Berg Balance Test      Berg Balance Test   Sit to Stand Able to stand without using hands and stabilize independently    Standing Unsupported Able to stand 2 minutes with supervision    Sitting with Back Unsupported but Feet Supported on Floor or Stool Able to sit safely and securely 2 minutes    Stand to Sit Sits safely with minimal use of hands    Transfers Able to transfer safely, definite need of hands    Standing Unsupported with Eyes Closed Unable to keep eyes closed 3 seconds but stays steady    Standing Unsupported with Feet Together Able to place feet together independently and stand  for 1 minute with supervision    From Standing, Reach Forward with Outstretched Arm Can reach forward >12 cm safely (5)    From Standing Position, Pick up Object from Floor Able to pick up shoe, needs supervision    From Standing Position, Turn to Look Behind Over each Shoulder Needs supervision when turning    Turn 360 Degrees Needs close supervision or verbal cueing    Standing Unsupported, Alternately Place Feet on Step/Stool Needs assistance to keep from falling or unable to try    Standing Unsupported,  One Foot in Front Able to take small step independently and hold 30 seconds    Standing on One Leg Unable to try or needs assist to prevent fall    Total Score 32                                                                                                                                      TREATMENT DATE: 11/08/2023    PATIENT EDUCATION: Education details: Eval results, POC, initial HEP Person educated: Patient Education method: Explanation, Demonstration, Verbal cues, and Handouts Education comprehension: verbalized understanding, returned demonstration, and needs further education  HOME EXERCISE PROGRAM: Access Code: [email or web address] URL: [email or web address] Date: 11/08/2023 Prepared by: [facility] - Outpatient  Rehab - [facility]  Exercises - Standing Gastroc Stretch at Counter  - 1-2 x daily - 7 x weekly - 1 sets - 3 reps - 30 sec hold - Seated Hamstring stretch  - 1-2 x daily - 7 x weekly - 1 sets - 3 reps - 30 sec hold  GOALS: Goals reviewed with patient? Yes  SHORT TERM GOALS: Target date: 11/24/2023  Pt will be independent with HEP for improved balance, strength, gait. Baseline: Goal status: INITIAL  2.  Pt will improve 5x sit<>stand to less than or equal to 20 sec to demonstrate improved functional strength and transfer efficiency. Baseline: 26.46 sec Goal status: INITIAL  LONG TERM GOALS: Target date: 12/22/2023  Pt will be independent with progression of HEP for improved strength, balance, gait  . Baseline:  Goal status: INITIAL  2.  Pt will improve 5x sit<>stand to less than or equal to 15 sec to demonstrate improved functional strength and transfer efficiency. Baseline:  Goal status: INITIAL  3.  Pt will improve TUG score to less than or equal to 18 sec for decreased fall risk. Baseline: 25.44 sec Goal status: INITIAL  4.  Pt will improve Berg score to at least 45/56 to decrease fall risk. Baseline: 32/56 Goal status: INITIAL  5.   Pt will improve gait velocity to at least 2.3 ft/sec  for improved gait efficiency and safety. Baseline: 1.98 ft/sec Goal status: INITIAL  6.  Pt will verbalize understanding of fall prevention in home environment. Baseline:  Goal status: INITIAL  ASSESSMENT:  CLINICAL IMPRESSION: Patient is a 83 y.o. male who was seen today for physical therapy evaluation  and treatment for left foot drop/imbalance.  He reports having one fall in the past 6 months, hx of L foot drop (he is to have orthotic consult, per pt report).  He presents today with decreased functional strength, decreased flexibility, decreased balance, abnormal posture, LUE and L facial tremor (has been noted by Dr. Joane), decreased timing and coordination with gait.  He is at increased fall risk per Lars, TUG scores and demo limited community ambulator status per gait velocity.  He will benefit from skilled PT to address the above stated deficits to decrease fall risk and improve overall functional mobility and independence.   OBJECTIVE IMPAIRMENTS: Abnormal gait, decreased balance, decreased mobility, difficulty walking, decreased ROM, decreased strength, impaired flexibility, and postural dysfunction.   ACTIVITY LIMITATIONS: standing, transfers, and locomotion level  PARTICIPATION LIMITATIONS: community activity and yard work  PERSONAL FACTORS: 3+ comorbidities: see above are also affecting patient's functional outcome.   REHAB POTENTIAL: Good  CLINICAL DECISION MAKING: Evolving/moderate complexity  EVALUATION COMPLEXITY: Moderate  PLAN:  PT FREQUENCY: 2x/week  PT DURATION: 6 weeks plus eval  PLANNED INTERVENTIONS: 97750- Physical Performance Testing, 97110-Therapeutic exercises, 97530- Therapeutic activity, 97112- Neuromuscular re-education, 97535- Self Care, [id number]- Manual therapy, [phone]- Gait training, [phone]- Orthotic Initial, [phone]- Orthotic/Prosthetic subsequent, Patient/Family education, and Balance training  PLAN  FOR NEXT SESSION: Review initial HEP and progress for strength, balance; trial AFO (pt is to have AFO consult, so he may have one soon)   Zayley Arras W., PT 11/09/2023, 5:15 PM  [facility] [address], [address] [place], [place] [address] Phone # [phone] Fax # [phone]

## 2023-11-09 NOTE — Telephone Encounter (Signed)
Pt returning call to schedule pre op appt

## 2023-11-09 NOTE — Telephone Encounter (Signed)
 Appointment scheduled for 11/23/2023 @ 1:55 with [name], NP

## 2023-11-09 NOTE — Telephone Encounter (Signed)
 Tried to contact Dr. Eston nurse to see if she has availabilities, per patient request, if she does not, he is ok with scheduling with APP. Will wait for nurse to back to me.

## 2023-11-10 NOTE — Therapy (Signed)
 OUTPATIENT PHYSICAL THERAPY NEURO TREATMENT   Patient Name: William Norman MRN: [id number] DOB:1940-10-05, 83 y.o., male Today's Date: 11/13/2023   PCP: Kennyth Worth HERO, MD  REFERRING PROVIDER: Joane Artist RAMAN, MD   END OF SESSION:  PT End of Session - 11/13/23 1229     Visit Number 2    Number of Visits 13    Date for Recertification  12/22/23    Authorization Type [employer]    Progress Note Due on Visit 10    PT Start Time 1150    PT Stop Time 1228    PT Time Calculation (min) 38 min    Equipment Utilized During Treatment Gait belt    Activity Tolerance Patient tolerated treatment well    Behavior During Therapy WFL for tasks assessed/performed           Past Medical History:  Diagnosis Date   BPH (benign prostatic hypertrophy)    BPH associated with nocturia 08/29/2013   BPH with obstruction/lower urinary tract symptoms 08/14/2019   CAD (coronary artery disease)    CAD (coronary artery disease), native coronary artery    PTCA of RCA 1986, PTCA of LAD 1992,  Negative treadmill Cardiolite 2011    Chronic dermatitis of hands 10/12/2015   Chronic venous insufficiency    Coagulation disorder 10/26/2018   Diverticulitis    DVT (deep venous thrombosis) (HCC)    Dyslipidemia    Dysrhythmia    Eczema    Erectile dysfunction 07/11/2012   Essential hypertension    GERD 08/04/2006       GERD (gastroesophageal reflux disease)    Hyperglycemia 07/08/2019   Hyperlipidemia    Hypertension    Incomplete emptying of bladder 08/14/2019   Long term current use of anticoagulant therapy    Lumbar disc disease    Overweight (BMI 25.0-29.9) 06/18/2015   Persistent atrial fibrillation (HCC) 04/10/2018   Personal history of DVT (deep vein thrombosis)    Initially in 1998 with recurrence in 2002 now on chronic warfarin    Soft tissue lesion of foot 08/27/2015   Stroke [facility])    Urinary tract infection with hematuria 08/14/2019   Past Surgical History:  Procedure  Laterality Date   APPLICATION OF WOUND VAC  09/02/2020   Procedure: APPLICATION OF WOUND VAC;  Surgeon: Belinda Cough, MD;  Location: [facility] OR;  Service: General;;   CARDIAC CATHETERIZATION     CARPAL TUNNEL RELEASE  04/21/2011   Procedure: CARPAL TUNNEL RELEASE;  Surgeon: Franky JONELLE Curia, MD;  Location: [facility];  Service: Orthopedics;  Laterality: Left;   CARPAL TUNNEL RELEASE  05/23/2011   Procedure: CARPAL TUNNEL RELEASE;  Surgeon: Franky JONELLE Curia, MD;  Location: [facility];  Service: Orthopedics;  Laterality: Right;   COLON SURGERY  2010 and 2011 colostomy reversal   CORONARY ANGIOPLASTY     EYE SURGERY     repair of macular hole   IR RADIOLOGIST EVAL & MGMT  01/27/2023   LAPAROTOMY N/A 09/02/2020   Procedure: EXPLORATORY LAPAROTOMY, LYSIS OF ADHESIONS ,BOWEL RESECTION;  Surgeon: Belinda Cough, MD;  Location: [facility] OR;  Service: General;  Laterality: N/A;   LUMBAR LAMINECTOMY/DECOMPRESSION MICRODISCECTOMY Left 03/18/2021   Procedure: LAMINOTOMY, FORAMINOTOMY, LEFT LUMBAR THREE-FOUR;  Surgeon: Lanis Pupa, MD;  Location: [facility] OR;  Service: Neurosurgery;  Laterality: Left;   NASAL SEPTUM SURGERY     precutaneous transluminal coronary angioplasty     right hernia     TONSILLECTOMY AND ADENOIDECTOMY  Patient Active Problem List   Diagnosis Date Noted   Left foot drop 10/26/2023   Age-related cognitive decline 04/17/2023   Bilateral pseudophakia 04/17/2023   Chronic diastolic (congestive) heart failure (HCC) 04/17/2023   Chronic low back pain 04/17/2023   Disorder of prostate, unspecified 04/17/2023   Dry eye syndrome of bilateral lacrimal glands 04/17/2023   History of small bowel obstruction 04/17/2023   Intervertebral disc disorders with radiculopathy, lumbosacral region 04/17/2023   Localized swelling, mass and lump, left lower limb 04/17/2023   Neoplasm of uncertain behavior of skin 04/17/2023   Puckering of macula, left eye 04/17/2023   Retention of  urine, unspecified 04/17/2023   Tremor 01/26/2023   UTI (urinary tract infection) 11/01/2022   SIRS (systemic inflammatory response syndrome) (HCC) 10/31/2022   Permanent atrial fibrillation (HCC) 03/29/2022   Secondary hypercoagulable state 03/29/2022   History of CVA (cerebrovascular accident) 03/29/2022   Depression, major, single episode, mild 08/24/2021   Osteoarthritis 11/20/2020   Constipation 10/02/2020   Protein-calorie malnutrition, severe 09/03/2020   SBO (small bowel obstruction) (HCC) 08/28/2020   AAA (abdominal aortic aneurysm) 03/06/2020   History of ischemic stroke 02/16/2020   DDD (degenerative disc disease), lumbosacral 02/11/2020   Recurrent UTI 08/14/2019   Hyperglycemia 07/08/2019   Persistent atrial fibrillation (HCC) 04/10/2018   Chronic dermatitis of hands 10/12/2015   Stroke (HCC) 06/18/2015   BPH associated with nocturia 08/29/2013   CAD (coronary artery disease), native coronary artery    Long term current use of anticoagulant    Personal history of DVT (deep vein thrombosis)    Chronic venous insufficiency    Erectile dysfunction 07/11/2012   Lumbar radiculopathy    Dyslipidemia    Essential hypertension     ONSET DATE: 10/25/2023 (MD referral)  REFERRING DIAG:  M21.372 (ICD-10-CM) - Left foot drop  Z74.09 (ICD-10-CM) - Impaired functional mobility, balance, gait, and endurance  R25.1 (ICD-10-CM) - Tremor    THERAPY DIAG:  Unsteadiness on feet  Muscle weakness (generalized)  Other abnormalities of gait and mobility  Rationale for Evaluation and Treatment: Rehabilitation  SUBJECTIVE:                                                                                                                                                                                             SUBJECTIVE STATEMENT: Developed a slight pain in the R LB. Does not hurt at rest, only when walking. Denies recent falls.   Pt accompanied by: self  PERTINENT HISTORY:  Chronic left foot drop due to lumbar spinal stenosis. Does not have an AFO, LUE and L face tremor  PAIN:  Are  you having pain? Yes: NPRS scale: none at rest/10 Pain location: low back  Pain description: not real bad Aggravating factors: Unsure Relieving factors: unsure  PRECAUTIONS: Fall  RED FLAGS: None   WEIGHT BEARING RESTRICTIONS: No  FALLS: Has patient fallen in last 6 months? Yes. Number of falls 1  LIVING ENVIRONMENT: Lives with: lives with their family and lives with their spouse Lives in: House/apartment Stairs: 1 step to enter; steps to basement with rails Has following equipment at home: Single point cane  PLOF: Independent and Leisure: enjoys yardwork  PATIENT GOALS: Agreeable to working on balance  OBJECTIVE:       TODAY'S TREATMENT: 11/13/23 Activity Comments  Review of initial HEP: gastroc stretch 30 each sitting HS stretch 30 each  Cueing for proper positioning   romberg EO 2x30 Standing EC 2x30 Mild-mod sway especially to R and posterior with cues to rebalance wt shift. Good response to cues   heel raises 10x    alt backwards steps Weaned from single to no UE support; difficulty stepping L LE d/t foot drop; CGA-min A for safety   alt forward steps Weaned to no UE support; L foot sliding   Alt toe taps on TM  Weaned to no UE support         HOME EXERCISE PROGRAM Last updated: 11/13/23 Access Code: [email or web address] URL: [email or web address] Date: 11/13/2023 Prepared by: [facility] - Outpatient  Rehab - [facility]  Program Notes perform standing exercises in a corner or near handrail for safety  Exercises - Standing Gastroc Stretch at Counter  - 1-2 x daily - 7 x weekly - 1 sets - 3 reps - 30 sec hold - Seated Hamstring stretch  - 1-2 x daily - 7 x weekly - 1 sets - 3 reps - 30 sec hold - Standing Balance in Corner with Eyes Closed  - 1 x daily - 5 x weekly - 2-3 sets - 30 sec  hold - Standing Near Stance in Corner  - 1 x  daily - 5 x weekly - 2-3 sets - 30 sec  hold - Standing Toe Taps  - 1 x daily - 5 x weekly - 2 sets - 10 reps   PATIENT EDUCATION: Education details: ensured pt knows where to go for his orthotic appointment; HEP update with edu for safety  Person educated: Patient Education method: Explanation, Demonstration, Tactile cues, Verbal cues, and Handouts Education comprehension: verbalized understanding and returned demonstration     Note: Objective measures were completed at Evaluation unless otherwise noted.  DIAGNOSTIC FINDINGS: NA for this episode  COGNITION: Overall cognitive status: reports some memory issues   SENSATION: Light touch: Impaired on LLE and distally on LLE  COORDINATION: Unable to tap LLE; equal (both slightly slowed heel to shin)  POSTURE: rounded shoulders, forward head, and resting tremor LUE and face  LOWER EXTREMITY ROM:     Active  Right Eval Left Eval  Hip flexion    Hip extension    Hip abduction    Hip adduction    Hip internal rotation    Hip external rotation    Knee flexion -20 -20  Knee extension    Ankle dorsiflexion +5 A/ROM A/ROM neutral P/ROM + 5  Ankle plantarflexion    Ankle inversion    Ankle eversion     (Blank rows = not tested)  LOWER EXTREMITY MMT:    MMT Right Eval Left Eval  Hip flexion 4 3+  Hip extension    Hip  abduction    Hip adduction    Hip internal rotation    Hip external rotation    Knee flexion 4 3+  Knee extension 4 4  Ankle dorsiflexion 3+ 1  Ankle plantarflexion 3+ 3  Ankle inversion    Ankle eversion    (Blank rows = not tested)  TRANSFERS: Sit to stand: Modified independence  Assistive device utilized: BUE support     Stand to sit: Modified independence  Assistive device utilized: BUE support      GAIT: Findings: Gait Characteristics: step through pattern, decreased ankle dorsiflexion- Left, Left steppage, and poor foot clearance- Left, Distance walked: 50 ft, Assistive device  utilized:cane, and Level of assistance: SBA  FUNCTIONAL TESTS:  5 times sit to stand: 26.46 sec with BUE support Timed up and go (TUG): 25.44 sec no cane, unsteadiness with turns 10 meter walk test: 16.6 sec = 1.98 ft/sec Berg Balance test:32/56 (Scores <45/56 indicate increased fall risk)                                                                                                                                TREATMENT DATE: 11/08/2023    PATIENT EDUCATION: Education details: Eval results, POC, initial HEP Person educated: Patient Education method: Explanation, Demonstration, Verbal cues, and Handouts Education comprehension: verbalized understanding, returned demonstration, and needs further education  HOME EXERCISE PROGRAM: Access Code: [email or web address] URL: [email or web address] Date: 11/08/2023 Prepared by: [facility] - Outpatient  Rehab - [facility]  Exercises - Standing Gastroc Stretch at Counter  - 1-2 x daily - 7 x weekly - 1 sets - 3 reps - 30 sec hold - Seated Hamstring stretch  - 1-2 x daily - 7 x weekly - 1 sets - 3 reps - 30 sec hold  GOALS: Goals reviewed with patient? Yes  SHORT TERM GOALS: Target date: 11/24/2023  Pt will be independent with HEP for improved balance, strength, gait. Baseline: Goal status: IN PROGRESS  2.  Pt will improve 5x sit<>stand to less than or equal to 20 sec to demonstrate improved functional strength and transfer efficiency. Baseline: 26.46 sec Goal status: IN PROGRESS  LONG TERM GOALS: Target date: 12/22/2023  Pt will be independent with progression of HEP for improved strength, balance, gait  . Baseline:  Goal status: IN PROGRESS  2.  Pt will improve 5x sit<>stand to less than or equal to 15 sec to demonstrate improved functional strength and transfer efficiency. Baseline:  Goal status: IN PROGRESS  3.  Pt will improve TUG score to less than or equal to 18 sec for decreased fall risk. Baseline: 25.44  sec Goal status: IN PROGRESS  4.  Pt will improve Berg score to at least 45/56 to decrease fall risk. Baseline: 32/56 Goal status: IN PROGRESS  5.  Pt will improve gait velocity to at least 2.3 ft/sec  for improved gait efficiency and safety. Baseline: 1.98 ft/sec Goal status: IN PROGRESS  6.  Pt will verbalize understanding of fall prevention in home environment. Baseline:  Goal status: IN PROGRESS  ASSESSMENT:  CLINICAL IMPRESSION: Patient arrived to session with report of some LBP with activity. Reviewed HEP for max carryover and proceeded with balance activities. Patient with difficulty stepping L foot d/t anterior tibialis weakness, requiring steppage-like compensations. Good effort observed to use cueing provided to recover balance. Patient reported understanding of all edu provided and without complaints upon leaving.   OBJECTIVE IMPAIRMENTS: Abnormal gait, decreased balance, decreased mobility, difficulty walking, decreased ROM, decreased strength, impaired flexibility, and postural dysfunction.   ACTIVITY LIMITATIONS: standing, transfers, and locomotion level  PARTICIPATION LIMITATIONS: community activity and yard work  PERSONAL FACTORS: 3+ comorbidities: see above are also affecting patient's functional outcome.   REHAB POTENTIAL: Good  CLINICAL DECISION MAKING: Evolving/moderate complexity  EVALUATION COMPLEXITY: Moderate  PLAN:  PT FREQUENCY: 2x/week  PT DURATION: 6 weeks plus eval  PLANNED INTERVENTIONS: 97750- Physical Performance Testing, 97110-Therapeutic exercises, 97530- Therapeutic activity, 97112- Neuromuscular re-education, 97535- Self Care, [id number]- Manual therapy, [phone]- Gait training, [phone]- Orthotic Initial, [phone]- Orthotic/Prosthetic subsequent, Patient/Family education, and Balance training  PLAN FOR NEXT SESSION: Review HEP and progress for strength, balance; trial AFO (pt is to have AFO consult, so he may have one soon)   Louana Terrilyn Christians, PT, DPT 11/13/23 12:30 PM  [facility] [address] [place], [place] [address] Phone # [phone] Fax # [phone]

## 2023-11-11 ENCOUNTER — Other Ambulatory Visit (HOSPITAL_BASED_OUTPATIENT_CLINIC_OR_DEPARTMENT_OTHER): Payer: Self-pay | Admitting: Cardiology

## 2023-11-11 DIAGNOSIS — I10 Essential (primary) hypertension: Secondary | ICD-10-CM

## 2023-11-13 ENCOUNTER — Ambulatory Visit: Admitting: Physical Therapy

## 2023-11-13 ENCOUNTER — Encounter: Payer: Self-pay | Admitting: Physical Therapy

## 2023-11-13 DIAGNOSIS — M6281 Muscle weakness (generalized): Secondary | ICD-10-CM | POA: Diagnosis not present

## 2023-11-13 DIAGNOSIS — R2681 Unsteadiness on feet: Secondary | ICD-10-CM

## 2023-11-13 DIAGNOSIS — R2689 Other abnormalities of gait and mobility: Secondary | ICD-10-CM | POA: Diagnosis not present

## 2023-11-16 ENCOUNTER — Ambulatory Visit: Attending: Family Medicine

## 2023-11-16 DIAGNOSIS — M6281 Muscle weakness (generalized): Secondary | ICD-10-CM | POA: Diagnosis not present

## 2023-11-16 DIAGNOSIS — R2689 Other abnormalities of gait and mobility: Secondary | ICD-10-CM | POA: Insufficient documentation

## 2023-11-16 DIAGNOSIS — R2681 Unsteadiness on feet: Secondary | ICD-10-CM | POA: Diagnosis not present

## 2023-11-16 NOTE — Therapy (Signed)
 OUTPATIENT PHYSICAL THERAPY NEURO TREATMENT   Patient Name: William Norman MRN: [id number] DOB:08-15-1940, 83 y.o., male Today's Date: 11/16/2023   PCP: Kennyth Worth HERO, MD  REFERRING PROVIDER: Joane Artist RAMAN, MD   END OF SESSION:  PT End of Session - 11/16/23 1528     Visit Number 3    Number of Visits 13    Date for Recertification  12/22/23    Authorization Type [employer]    Progress Note Due on Visit 10    PT Start Time 1530    PT Stop Time 1615    PT Time Calculation (min) 45 min    Equipment Utilized During Treatment Gait belt    Activity Tolerance Patient tolerated treatment well    Behavior During Therapy [facility] for tasks assessed/performed           Past Medical History:  Diagnosis Date   BPH (benign prostatic hypertrophy)    BPH associated with nocturia 08/29/2013   BPH with obstruction/lower urinary tract symptoms 08/14/2019   CAD (coronary artery disease)    CAD (coronary artery disease), native coronary artery    PTCA of RCA 1986, PTCA of LAD 1992,  Negative treadmill Cardiolite 2011    Chronic dermatitis of hands 10/12/2015   Chronic venous insufficiency    Coagulation disorder 10/26/2018   Diverticulitis    DVT (deep venous thrombosis) (HCC)    Dyslipidemia    Dysrhythmia    Eczema    Erectile dysfunction 07/11/2012   Essential hypertension    GERD 08/04/2006       GERD (gastroesophageal reflux disease)    Hyperglycemia 07/08/2019   Hyperlipidemia    Hypertension    Incomplete emptying of bladder 08/14/2019   Long term current use of anticoagulant therapy    Lumbar disc disease    Overweight (BMI 25.0-29.9) 06/18/2015   Persistent atrial fibrillation (HCC) 04/10/2018   Personal history of DVT (deep vein thrombosis)    Initially in 1998 with recurrence in 2002 now on chronic warfarin    Soft tissue lesion of foot 08/27/2015   Stroke [facility])    Urinary tract infection with hematuria 08/14/2019   Past Surgical History:  Procedure  Laterality Date   APPLICATION OF WOUND VAC  09/02/2020   Procedure: APPLICATION OF WOUND VAC;  Surgeon: Belinda Cough, MD;  Location: [facility] OR;  Service: General;;   CARDIAC CATHETERIZATION     CARPAL TUNNEL RELEASE  04/21/2011   Procedure: CARPAL TUNNEL RELEASE;  Surgeon: Franky JONELLE Curia, MD;  Location: [facility];  Service: Orthopedics;  Laterality: Left;   CARPAL TUNNEL RELEASE  05/23/2011   Procedure: CARPAL TUNNEL RELEASE;  Surgeon: Franky JONELLE Curia, MD;  Location: [facility];  Service: Orthopedics;  Laterality: Right;   COLON SURGERY  2010 and 2011 colostomy reversal   CORONARY ANGIOPLASTY     EYE SURGERY     repair of macular hole   IR RADIOLOGIST EVAL & MGMT  01/27/2023   LAPAROTOMY N/A 09/02/2020   Procedure: EXPLORATORY LAPAROTOMY, LYSIS OF ADHESIONS ,BOWEL RESECTION;  Surgeon: Belinda Cough, MD;  Location: [facility] OR;  Service: General;  Laterality: N/A;   LUMBAR LAMINECTOMY/DECOMPRESSION MICRODISCECTOMY Left 03/18/2021   Procedure: LAMINOTOMY, FORAMINOTOMY, LEFT LUMBAR THREE-FOUR;  Surgeon: Lanis Pupa, MD;  Location: [facility] OR;  Service: Neurosurgery;  Laterality: Left;   NASAL SEPTUM SURGERY     precutaneous transluminal coronary angioplasty     right hernia     TONSILLECTOMY AND ADENOIDECTOMY  Patient Active Problem List   Diagnosis Date Noted   Left foot drop 10/26/2023   Age-related cognitive decline 04/17/2023   Bilateral pseudophakia 04/17/2023   Chronic diastolic (congestive) heart failure (HCC) 04/17/2023   Chronic low back pain 04/17/2023   Disorder of prostate, unspecified 04/17/2023   Dry eye syndrome of bilateral lacrimal glands 04/17/2023   History of small bowel obstruction 04/17/2023   Intervertebral disc disorders with radiculopathy, lumbosacral region 04/17/2023   Localized swelling, mass and lump, left lower limb 04/17/2023   Neoplasm of uncertain behavior of skin 04/17/2023   Puckering of macula, left eye 04/17/2023   Retention of  urine, unspecified 04/17/2023   Tremor 01/26/2023   UTI (urinary tract infection) 11/01/2022   SIRS (systemic inflammatory response syndrome) (HCC) 10/31/2022   Permanent atrial fibrillation (HCC) 03/29/2022   Secondary hypercoagulable state 03/29/2022   History of CVA (cerebrovascular accident) 03/29/2022   Depression, major, single episode, mild 08/24/2021   Osteoarthritis 11/20/2020   Constipation 10/02/2020   Protein-calorie malnutrition, severe 09/03/2020   SBO (small bowel obstruction) (HCC) 08/28/2020   AAA (abdominal aortic aneurysm) 03/06/2020   History of ischemic stroke 02/16/2020   DDD (degenerative disc disease), lumbosacral 02/11/2020   Recurrent UTI 08/14/2019   Hyperglycemia 07/08/2019   Persistent atrial fibrillation (HCC) 04/10/2018   Chronic dermatitis of hands 10/12/2015   Stroke (HCC) 06/18/2015   BPH associated with nocturia 08/29/2013   CAD (coronary artery disease), native coronary artery    Long term current use of anticoagulant    Personal history of DVT (deep vein thrombosis)    Chronic venous insufficiency    Erectile dysfunction 07/11/2012   Lumbar radiculopathy    Dyslipidemia    Essential hypertension     ONSET DATE: 10/25/2023 (MD referral)  REFERRING DIAG:  M21.372 (ICD-10-CM) - Left foot drop  Z74.09 (ICD-10-CM) - Impaired functional mobility, balance, gait, and endurance  R25.1 (ICD-10-CM) - Tremor    THERAPY DIAG:  Unsteadiness on feet  Muscle weakness (generalized)  Other abnormalities of gait and mobility  Rationale for Evaluation and Treatment: Rehabilitation  SUBJECTIVE:                                                                                                                                                                                             SUBJECTIVE STATEMENT: Still having some ongoing right side back pain  Pt accompanied by: self  PERTINENT HISTORY: Chronic left foot drop due to lumbar spinal stenosis.  Does not have an AFO, LUE and L face tremor  PAIN:  Are you having pain? Yes: NPRS scale: 7-810 Pain location:  right low  back  Pain description: not real bad Aggravating factors: Unsure Relieving factors: tylenol   PRECAUTIONS: Fall  RED FLAGS: None   WEIGHT BEARING RESTRICTIONS: No  FALLS: Has patient fallen in last 6 months? Yes. Number of falls 1  LIVING ENVIRONMENT: Lives with: lives with their family and lives with their spouse Lives in: House/apartment Stairs: 1 step to enter; steps to basement with rails Has following equipment at home: Single point cane  PLOF: Independent and Leisure: enjoys yardwork  PATIENT GOALS: Agreeable to working on balance  OBJECTIVE:    TODAY'S TREATMENT: 11/16/23 Activity Comments  Trials w/ AFO Left --improved gait mechanics  palpation Some tenderness along iliolumbar and right QL w/ palpable trigger point  Physioball roll out -minimal sensation to movement  Seated knee to chest -some tension to problem area  Manual therapy  -PIVM PA lumbar spine, soft tissue mobilization right QL and lumbar paraspinal. Mobilization with movement for lumbar extension  LTR x 2 min   SLR Pain to L5 w/ LE elevation          TODAY'S TREATMENT: 11/13/23 Activity Comments  Review of initial HEP: gastroc stretch 30 each sitting HS stretch 30 each  Cueing for proper positioning   romberg EO 2x30 Standing EC 2x30 Mild-mod sway especially to R and posterior with cues to rebalance wt shift. Good response to cues   heel raises 10x    alt backwards steps Weaned from single to no UE support; difficulty stepping L LE d/t foot drop; CGA-min A for safety   alt forward steps Weaned to no UE support; L foot sliding   Alt toe taps on TM  Weaned to no UE support         HOME EXERCISE PROGRAM Last updated: 11/13/23 Access Code: [email or web address] URL: [email or web address] Date: 11/13/2023 Prepared by: [facility] - Outpatient  Rehab - [facility]  Program Notes perform standing exercises in a corner or near handrail for safety  Exercises - Standing Gastroc Stretch at Counter  - 1-2 x daily - 7 x weekly - 1 sets - 3 reps - 30 sec hold - Seated Hamstring stretch  - 1-2 x daily - 7 x weekly - 1 sets - 3 reps - 30 sec hold - Standing Balance in Corner with Eyes Closed  - 1 x daily - 5 x weekly - 2-3 sets - 30 sec  hold - Standing Near Stance in Corner  - 1 x daily - 5 x weekly - 2-3 sets - 30 sec  hold - Standing Toe Taps  - 1 x daily - 5 x weekly - 2 sets - 10 reps   PATIENT EDUCATION: Education details: ensured pt knows where to go for his orthotic appointment; HEP update with edu for safety  Person educated: Patient Education method: Explanation, Demonstration, Tactile cues, Verbal cues, and Handouts Education comprehension: verbalized understanding and returned demonstration     Note: Objective measures were completed at Evaluation unless otherwise noted.  DIAGNOSTIC FINDINGS: NA for this episode  COGNITION: Overall cognitive status: reports some memory issues   SENSATION: Light touch: Impaired on LLE and distally on LLE  COORDINATION: Unable to tap LLE; equal (both slightly slowed heel to shin)  POSTURE: rounded shoulders, forward head, and resting tremor LUE and face  LOWER EXTREMITY ROM:     Active  Right Eval Left Eval  Hip flexion    Hip extension    Hip abduction    Hip adduction    Hip internal rotation  Hip external rotation    Knee flexion -20 -20  Knee extension    Ankle dorsiflexion +5 A/ROM A/ROM neutral P/ROM + 5  Ankle plantarflexion    Ankle inversion    Ankle eversion     (Blank rows = not tested)  LOWER EXTREMITY MMT:    MMT Right Eval Left Eval  Hip flexion 4 3+  Hip extension    Hip abduction    Hip adduction    Hip internal rotation    Hip external rotation    Knee flexion 4 3+  Knee extension 4 4  Ankle dorsiflexion 3+ 1  Ankle plantarflexion 3+ 3  Ankle  inversion    Ankle eversion    (Blank rows = not tested)  TRANSFERS: Sit to stand: Modified independence  Assistive device utilized: BUE support     Stand to sit: Modified independence  Assistive device utilized: BUE support      GAIT: Findings: Gait Characteristics: step through pattern, decreased ankle dorsiflexion- Left, Left steppage, and poor foot clearance- Left, Distance walked: 50 ft, Assistive device utilized:cane, and Level of assistance: SBA  FUNCTIONAL TESTS:  5 times sit to stand: 26.46 sec with BUE support Timed up and go (TUG): 25.44 sec no cane, unsteadiness with turns 10 meter walk test: 16.6 sec = 1.98 ft/sec Berg Balance test:32/56 (Scores <45/56 indicate increased fall risk)                                                                                                                                TREATMENT DATE: 11/08/2023    PATIENT EDUCATION: Education details: Eval results, POC, initial HEP Person educated: Patient Education method: Explanation, Demonstration, Verbal cues, and Handouts Education comprehension: verbalized understanding, returned demonstration, and needs further education  HOME EXERCISE PROGRAM: Access Code: [email or web address] URL: [email or web address] Date: 11/08/2023 Prepared by: [facility] - Outpatient  Rehab - [facility]  Exercises - Standing Gastroc Stretch at Counter  - 1-2 x daily - 7 x weekly - 1 sets - 3 reps - 30 sec hold - Seated Hamstring stretch  - 1-2 x daily - 7 x weekly - 1 sets - 3 reps - 30 sec hold  GOALS: Goals reviewed with patient? Yes  SHORT TERM GOALS: Target date: 11/24/2023  Pt will be independent with HEP for improved balance, strength, gait. Baseline: Goal status: IN PROGRESS  2.  Pt will improve 5x sit<>stand to less than or equal to 20 sec to demonstrate improved functional strength and transfer efficiency. Baseline: 26.46 sec Goal status: IN PROGRESS  LONG TERM GOALS: Target date:  12/22/2023  Pt will be independent with progression of HEP for improved strength, balance, gait  . Baseline:  Goal status: IN PROGRESS  2.  Pt will improve 5x sit<>stand to less than or equal to 15 sec to demonstrate improved functional strength and transfer efficiency. Baseline:  Goal status: IN PROGRESS  3.  Pt will improve TUG score  to less than or equal to 18 sec for decreased fall risk. Baseline: 25.44 sec Goal status: IN PROGRESS  4.  Pt will improve Berg score to at least 45/56 to decrease fall risk. Baseline: 32/56 Goal status: IN PROGRESS  5.  Pt will improve gait velocity to at least 2.3 ft/sec  for improved gait efficiency and safety. Baseline: 1.98 ft/sec Goal status: IN PROGRESS  6.  Pt will verbalize understanding of fall prevention in home environment. Baseline:  Goal status: IN PROGRESS  ASSESSMENT:  CLINICAL IMPRESSION: Trials of PLS AFO left ankle with improved gait mechanics able to eliminate steppage pattern and pt reports improved confidence with ambulation using orthotic. Pt notes ongoing right side low back pain with palpable trigger point/pain to right iliolumbar area and quadratus lumborum. Seated flexion movements without significant provocation. Manual therapy to improve extension-bias and pain reduction with grade 2 oscillations for extension mobility. Pain to Straight Leg Raise position to L5 of right lumbar. Instructed in gentle ROM movements to reduce pain for improved mobility. Reports decreased pain 4/10 end of session.   OBJECTIVE IMPAIRMENTS: Abnormal gait, decreased balance, decreased mobility, difficulty walking, decreased ROM, decreased strength, impaired flexibility, and postural dysfunction.   ACTIVITY LIMITATIONS: standing, transfers, and locomotion level  PARTICIPATION LIMITATIONS: community activity and yard work  PERSONAL FACTORS: 3+ comorbidities: see above are also affecting patient's functional outcome.   REHAB POTENTIAL:  Good  CLINICAL DECISION MAKING: Evolving/moderate complexity  EVALUATION COMPLEXITY: Moderate  PLAN:  PT FREQUENCY: 2x/week  PT DURATION: 6 weeks plus eval  PLANNED INTERVENTIONS: 97750- Physical Performance Testing, 97110-Therapeutic exercises, 97530- Therapeutic activity, 97112- Neuromuscular re-education, 97535- Self Care, [id number]- Manual therapy, [id number]- Gait training, [id number]- Orthotic Initial, [phone]- Orthotic/Prosthetic subsequent, Patient/Family education, and Balance training  PLAN FOR NEXT SESSION: Review HEP and progress for strength, balance; trial AFO (pt is to have AFO consult, so he may have one soon--reports Lanae reached out regarding need for paper work   4:15 PM, 11/16/23 M. Kelly Janayah Zavada, PT, DPT Physical Therapist- [place] Office Number: [phone] Fax # [phone] )

## 2023-11-17 NOTE — Therapy (Signed)
 OUTPATIENT PHYSICAL THERAPY NEURO TREATMENT   Patient Name: William Norman MRN: [id number] DOB:Oct 11, 1940, 83 y.o., male Today's Date: 11/20/2023   PCP: Kennyth Worth HERO, MD  REFERRING PROVIDER: Joane Artist RAMAN, MD   END OF SESSION:  PT End of Session - 11/20/23 1613     Visit Number 4    Number of Visits 13    Date for Recertification  12/22/23    Authorization Type [employer]    Progress Note Due on Visit 10    PT Start Time 1537    PT Stop Time 1616    PT Time Calculation (min) 39 min    Equipment Utilized During Treatment Gait belt    Activity Tolerance Patient tolerated treatment well    Behavior During Therapy WFL for tasks assessed/performed            Past Medical History:  Diagnosis Date   BPH (benign prostatic hypertrophy)    BPH associated with nocturia 08/29/2013   BPH with obstruction/lower urinary tract symptoms 08/14/2019   CAD (coronary artery disease)    CAD (coronary artery disease), native coronary artery    PTCA of RCA 1986, PTCA of LAD 1992,  Negative treadmill Cardiolite 2011    Chronic dermatitis of hands 10/12/2015   Chronic venous insufficiency    Coagulation disorder 10/26/2018   Diverticulitis    DVT (deep venous thrombosis) (HCC)    Dyslipidemia    Dysrhythmia    Eczema    Erectile dysfunction 07/11/2012   Essential hypertension    GERD 08/04/2006       GERD (gastroesophageal reflux disease)    Hyperglycemia 07/08/2019   Hyperlipidemia    Hypertension    Incomplete emptying of bladder 08/14/2019   Long term current use of anticoagulant therapy    Lumbar disc disease    Overweight (BMI 25.0-29.9) 06/18/2015   Persistent atrial fibrillation (HCC) 04/10/2018   Personal history of DVT (deep vein thrombosis)    Initially in 1998 with recurrence in 2002 now on chronic warfarin    Soft tissue lesion of foot 08/27/2015   Stroke [facility])    Urinary tract infection with hematuria 08/14/2019   Past Surgical History:   Procedure Laterality Date   APPLICATION OF WOUND VAC  09/02/2020   Procedure: APPLICATION OF WOUND VAC;  Surgeon: Belinda Cough, MD;  Location: [facility] OR;  Service: General;;   CARDIAC CATHETERIZATION     CARPAL TUNNEL RELEASE  04/21/2011   Procedure: CARPAL TUNNEL RELEASE;  Surgeon: Franky JONELLE Curia, MD;  Location: [facility];  Service: Orthopedics;  Laterality: Left;   CARPAL TUNNEL RELEASE  05/23/2011   Procedure: CARPAL TUNNEL RELEASE;  Surgeon: Franky JONELLE Curia, MD;  Location: [facility];  Service: Orthopedics;  Laterality: Right;   COLON SURGERY  2010 and 2011 colostomy reversal   CORONARY ANGIOPLASTY     EYE SURGERY     repair of macular hole   IR RADIOLOGIST EVAL & MGMT  01/27/2023   LAPAROTOMY N/A 09/02/2020   Procedure: EXPLORATORY LAPAROTOMY, LYSIS OF ADHESIONS ,BOWEL RESECTION;  Surgeon: Belinda Cough, MD;  Location: [facility] OR;  Service: General;  Laterality: N/A;   LUMBAR LAMINECTOMY/DECOMPRESSION MICRODISCECTOMY Left 03/18/2021   Procedure: LAMINOTOMY, FORAMINOTOMY, LEFT LUMBAR THREE-FOUR;  Surgeon: Lanis Pupa, MD;  Location: [facility] OR;  Service: Neurosurgery;  Laterality: Left;   NASAL SEPTUM SURGERY     precutaneous transluminal coronary angioplasty     right hernia     TONSILLECTOMY AND ADENOIDECTOMY  Patient Active Problem List   Diagnosis Date Noted   Left foot drop 10/26/2023   Age-related cognitive decline 04/17/2023   Bilateral pseudophakia 04/17/2023   Chronic diastolic (congestive) heart failure (HCC) 04/17/2023   Chronic low back pain 04/17/2023   Disorder of prostate, unspecified 04/17/2023   Dry eye syndrome of bilateral lacrimal glands 04/17/2023   History of small bowel obstruction 04/17/2023   Intervertebral disc disorders with radiculopathy, lumbosacral region 04/17/2023   Localized swelling, mass and lump, left lower limb 04/17/2023   Neoplasm of uncertain behavior of skin 04/17/2023   Puckering of macula, left eye 04/17/2023    Retention of urine, unspecified 04/17/2023   Tremor 01/26/2023   UTI (urinary tract infection) 11/01/2022   SIRS (systemic inflammatory response syndrome) (HCC) 10/31/2022   Permanent atrial fibrillation (HCC) 03/29/2022   Secondary hypercoagulable state 03/29/2022   History of CVA (cerebrovascular accident) 03/29/2022   Depression, major, single episode, mild 08/24/2021   Osteoarthritis 11/20/2020   Constipation 10/02/2020   Protein-calorie malnutrition, severe 09/03/2020   SBO (small bowel obstruction) (HCC) 08/28/2020   AAA (abdominal aortic aneurysm) 03/06/2020   History of ischemic stroke 02/16/2020   DDD (degenerative disc disease), lumbosacral 02/11/2020   Recurrent UTI 08/14/2019   Hyperglycemia 07/08/2019   Persistent atrial fibrillation (HCC) 04/10/2018   Chronic dermatitis of hands 10/12/2015   Stroke (HCC) 06/18/2015   BPH associated with nocturia 08/29/2013   CAD (coronary artery disease), native coronary artery    Long term current use of anticoagulant    Personal history of DVT (deep vein thrombosis)    Chronic venous insufficiency    Erectile dysfunction 07/11/2012   Lumbar radiculopathy    Dyslipidemia    Essential hypertension     ONSET DATE: 10/25/2023 (MD referral)  REFERRING DIAG:  M21.372 (ICD-10-CM) - Left foot drop  Z74.09 (ICD-10-CM) - Impaired functional mobility, balance, gait, and endurance  R25.1 (ICD-10-CM) - Tremor    THERAPY DIAG:  Unsteadiness on feet  Muscle weakness (generalized)  Other abnormalities of gait and mobility  Rationale for Evaluation and Treatment: Rehabilitation  SUBJECTIVE:                                                                                                                                                                                             SUBJECTIVE STATEMENT: Pt reports that he got a call from [place] about a foot brace in [place]. Reports that he did not have an appointment on [facility]. As he  suspected.   Pt accompanied by: self  PERTINENT HISTORY: Chronic left foot drop due to lumbar spinal stenosis. Does not have an  AFO, LUE and L face tremor  PAIN:  Are you having pain? Yes: NPRS scale: no/10 Pain location:  right low back  Pain description: not real bad Aggravating factors: Unsure Relieving factors: tylenol   PRECAUTIONS: Fall  RED FLAGS: None   WEIGHT BEARING RESTRICTIONS: No  FALLS: Has patient fallen in last 6 months? Yes. Number of falls 1  LIVING ENVIRONMENT: Lives with: lives with their family and lives with their spouse Lives in: House/apartment Stairs: 1 step to enter; steps to basement with rails Has following equipment at home: Single point cane  PLOF: Independent and Leisure: enjoys yardwork  PATIENT GOALS: Agreeable to working on balance  OBJECTIVE:     TODAY'S TREATMENT: 11/20/23 Activity Comments  Nustep L4 x 7 min UEs/LEs Dynamic warm up   alt toe taps on step 6 and 8 Sidestepping 2x1 min  staggered ant/pos wt shift  sidesteps onto foam multidirectional stepping to targets on floor step ups 6  In II bars. Occasional CGA-min A with toe taps. Weaned to fingertips with staggered wt shift. Was able to wean to 1 UE support with sidesteps. Profound L quad weakness with step ups and requires B UE support to complete      PATIENT EDUCATION: Education details: edu on process of obtaining an AFO and messaged Dr. Joane on pt's behalf to get more information on where pt should be getting his AFO Person educated: Patient Education method: Explanation Education comprehension: verbalized understanding    HOME EXERCISE PROGRAM Last updated: 11/13/23 Access Code: [email or web address] URL: [email or web address] Date: 11/13/2023 Prepared by: [facility] - Outpatient  Rehab - [facility]  Program Notes perform standing exercises in a corner or near handrail for safety  Exercises - Standing Gastroc Stretch at Counter  - 1-2 x daily  - 7 x weekly - 1 sets - 3 reps - 30 sec hold - Seated Hamstring stretch  - 1-2 x daily - 7 x weekly - 1 sets - 3 reps - 30 sec hold - Standing Balance in Corner with Eyes Closed  - 1 x daily - 5 x weekly - 2-3 sets - 30 sec  hold - Standing Near Stance in Corner  - 1 x daily - 5 x weekly - 2-3 sets - 30 sec  hold - Standing Toe Taps  - 1 x daily - 5 x weekly - 2 sets - 10 reps   PATIENT EDUCATION: Education details: ensured pt knows where to go for his orthotic appointment; HEP update with edu for safety  Person educated: Patient Education method: Explanation, Demonstration, Tactile cues, Verbal cues, and Handouts Education comprehension: verbalized understanding and returned demonstration     Note: Objective measures were completed at Evaluation unless otherwise noted.  DIAGNOSTIC FINDINGS: NA for this episode  COGNITION: Overall cognitive status: reports some memory issues   SENSATION: Light touch: Impaired on LLE and distally on LLE  COORDINATION: Unable to tap LLE; equal (both slightly slowed heel to shin)  POSTURE: rounded shoulders, forward head, and resting tremor LUE and face  LOWER EXTREMITY ROM:     Active  Right Eval Left Eval  Hip flexion    Hip extension    Hip abduction    Hip adduction    Hip internal rotation    Hip external rotation    Knee flexion -20 -20  Knee extension    Ankle dorsiflexion +5 A/ROM A/ROM neutral P/ROM + 5  Ankle plantarflexion    Ankle inversion    Ankle eversion     (  Blank rows = not tested)  LOWER EXTREMITY MMT:    MMT Right Eval Left Eval  Hip flexion 4 3+  Hip extension    Hip abduction    Hip adduction    Hip internal rotation    Hip external rotation    Knee flexion 4 3+  Knee extension 4 4  Ankle dorsiflexion 3+ 1  Ankle plantarflexion 3+ 3  Ankle inversion    Ankle eversion    (Blank rows = not tested)  TRANSFERS: Sit to stand: Modified independence  Assistive device utilized: BUE support     Stand  to sit: Modified independence  Assistive device utilized: BUE support      GAIT: Findings: Gait Characteristics: step through pattern, decreased ankle dorsiflexion- Left, Left steppage, and poor foot clearance- Left, Distance walked: 50 ft, Assistive device utilized:cane, and Level of assistance: SBA  FUNCTIONAL TESTS:  5 times sit to stand: 26.46 sec with BUE support Timed up and go (TUG): 25.44 sec no cane, unsteadiness with turns 10 meter walk test: 16.6 sec = 1.98 ft/sec Berg Balance test:32/56 (Scores <45/56 indicate increased fall risk)                                                                                                                                TREATMENT DATE: 11/08/2023    PATIENT EDUCATION: Education details: Eval results, POC, initial HEP Person educated: Patient Education method: Explanation, Demonstration, Verbal cues, and Handouts Education comprehension: verbalized understanding, returned demonstration, and needs further education  HOME EXERCISE PROGRAM: Access Code: [email or web address] URL: [email or web address] Date: 11/08/2023 Prepared by: [facility] - Outpatient  Rehab - [facility]  Exercises - Standing Gastroc Stretch at Counter  - 1-2 x daily - 7 x weekly - 1 sets - 3 reps - 30 sec hold - Seated Hamstring stretch  - 1-2 x daily - 7 x weekly - 1 sets - 3 reps - 30 sec hold  GOALS: Goals reviewed with patient? Yes  SHORT TERM GOALS: Target date: 11/24/2023  Pt will be independent with HEP for improved balance, strength, gait. Baseline: Goal status: IN PROGRESS  2.  Pt will improve 5x sit<>stand to less than or equal to 20 sec to demonstrate improved functional strength and transfer efficiency. Baseline: 26.46 sec Goal status: IN PROGRESS  LONG TERM GOALS: Target date: 12/22/2023  Pt will be independent with progression of HEP for improved strength, balance, gait  . Baseline:  Goal status: IN PROGRESS  2.  Pt will improve 5x  sit<>stand to less than or equal to 15 sec to demonstrate improved functional strength and transfer efficiency. Baseline:  Goal status: IN PROGRESS  3.  Pt will improve TUG score to less than or equal to 18 sec for decreased fall risk. Baseline: 25.44 sec Goal status: IN PROGRESS  4.  Pt will improve Berg score to at least 45/56 to decrease fall risk. Baseline: 32/56 Goal status: IN PROGRESS  5.  Pt will improve gait velocity to at least 2.3 ft/sec  for improved gait efficiency and safety. Baseline: 1.98 ft/sec Goal status: IN PROGRESS  6.  Pt will verbalize understanding of fall prevention in home environment. Baseline:  Goal status: IN PROGRESS  ASSESSMENT:  CLINICAL IMPRESSION: Patient arrived to session with questions about obtaining AFO. Assisted and provided edu on this. Proceeded with balance challenges with occasional imbalance evident with SLS tasks and profound L quad weakness is evident with step ups. Patient tolerated duration of session well and without complaints upon leaving.   OBJECTIVE IMPAIRMENTS: Abnormal gait, decreased balance, decreased mobility, difficulty walking, decreased ROM, decreased strength, impaired flexibility, and postural dysfunction.   ACTIVITY LIMITATIONS: standing, transfers, and locomotion level  PARTICIPATION LIMITATIONS: community activity and yard work  PERSONAL FACTORS: 3+ comorbidities: see above are also affecting patient's functional outcome.   REHAB POTENTIAL: Good  CLINICAL DECISION MAKING: Evolving/moderate complexity  EVALUATION COMPLEXITY: Moderate  PLAN:  PT FREQUENCY: 2x/week  PT DURATION: 6 weeks plus eval  PLANNED INTERVENTIONS: 97750- Physical Performance Testing, 97110-Therapeutic exercises, 97530- Therapeutic activity, 97112- Neuromuscular re-education, 97535- Self Care, 02859- Manual therapy, [phone]- Gait training, [phone]- Orthotic Initial, [phone]- Orthotic/Prosthetic subsequent, Patient/Family education, and Balance  training  PLAN FOR NEXT SESSION: Review HEP and progress for strength, balance; trial AFO, f/u on AFO order from Dr. Joane? Theresa messaged him 11/20/23 via staff message)    Louana Terrilyn Christians, PT, DPT 11/20/23 4:17 PM  [facility] at [facility] [address] [place], [place] [address] Phone # [phone] Fax # [phone]

## 2023-11-20 ENCOUNTER — Ambulatory Visit: Admitting: Physical Therapy

## 2023-11-20 ENCOUNTER — Encounter: Payer: Self-pay | Admitting: Physical Therapy

## 2023-11-20 DIAGNOSIS — R2681 Unsteadiness on feet: Secondary | ICD-10-CM | POA: Diagnosis not present

## 2023-11-20 DIAGNOSIS — M6281 Muscle weakness (generalized): Secondary | ICD-10-CM

## 2023-11-20 DIAGNOSIS — R2689 Other abnormalities of gait and mobility: Secondary | ICD-10-CM | POA: Diagnosis not present

## 2023-11-20 NOTE — Telephone Encounter (Signed)
 Patient with diagnosis of afib on Eliquis  for anticoagulation.    Procedure: PROSTATIC ARTERY EMBOLIZATION  Date of procedure: TBD   CHA2DS2-VASc Score = 6   This indicates a 9.7% annual risk of stroke. The patient's score is based upon: CHF History: 0 HTN History: 1 Diabetes History: 0 Stroke History: 2 Vascular Disease History: 1 Age Score: 2 Gender Score: 0      CrCl 104 ml/min Platelet count 197  Pt with DVT in 1998 and repeat in 2002 also with hx of TIA and possible CVA off of anticoagulation (only off one day). Per Dr. Lonni note on 09/27/22 would bridge for procedures.   Patient has not had an Afib/aflutter ablation or Watchman within the last 3 months or DCCV within the last 30 days   Per office protocol, patient can hold Eliquis  for 2 days prior to procedure.    Patient WILL need bridging with Lovenox (enoxaparin) around procedure.  Once procedure is scheduled, patient will need to let us  know the date, so we can coordinate bridge.  **This guidance is not considered finalized until pre-operative APP has relayed final recommendations.**

## 2023-11-22 ENCOUNTER — Other Ambulatory Visit (HOSPITAL_BASED_OUTPATIENT_CLINIC_OR_DEPARTMENT_OTHER): Payer: Self-pay | Admitting: Cardiology

## 2023-11-22 DIAGNOSIS — I10 Essential (primary) hypertension: Secondary | ICD-10-CM

## 2023-11-22 NOTE — Progress Notes (Signed)
 " Cardiology Office Note   Date:  11/23/2023  ID:  William Norman, William Norman Jul 20, 1940, MRN [id number] PCP: Kennyth Worth HERO, MD  [facility] Providers Cardiologist:  Shelda Bruckner, MD     PMH DVT TIA/CVA Permanent atrial fibrillation on chronic anticoagulation Hyperlipidemia Chronic HFpEF CAD Per notes from Dr. Blanca Cath in 1992 with PTCA to LAD Prior PTCA to RCA in 1986 Chronic venous insufficiency  Prior patient of Dr. Blanca, he was then seen by Dr. Monetta and transferred care to Dr. Bruckner 03/29/2022.  Echo from [facility] 02/16/2022 revealed LVEF 50 to 55%, global hypokinesis, mild LVH severe dilatation of left atrium, moderate aortic regurgitation, mild MR, mild TR. permanent atrial fibrillation, he is overall asymptomatic.  Prior history of stroke/TIA while off apixaban  for epidural injection.  He was active with raking leaves and pushing lawnmower.  Chronic LE edema felt to be secondary to venous insufficiency.  Last cardiology clinic visit was 09/27/2022 with Dr. Bruckner.  He was doing well overall with no concerning cardiac symptoms.  Straight caths twice a day, no hematuria.  Follow-up in 6 months was recommended.  History of Present Illness Discussed the use of AI scribe software for clinical note transcription with the patient, who gave verbal consent to proceed.  History of Present Illness William Norman is a very pleasant 83 year old male who presents for preoperative evaluation for prostate artery embolization. He reports prostate artery embolization as an alternative to surgical intervention for his prostate condition. He recalls a history of stroke after discontinuing anticoagulation in the past. He experiences tremor and is awaiting testing. He has a history of left foot drop, contributing to a fall in the past but not recently. He reports leg swelling, particularly in the left leg, which is slightly larger than the right. He denies  lightheadedness, dizziness, presyncope, syncope. He is physically active with walking around at home performing household chores, and managing stairs without chest pain, shortness of breath, orthopnea, PND, palpitations.  He does not have any cardiac concerns today.  ROS: See HPI  Studies Reviewed EKG Interpretation Date/Time:  Thursday November 23 2023 14:00:28 EDT Ventricular Rate:  73 PR Interval:    QRS Duration:  96 QT Interval:  392 QTC Calculation: 431 R Axis:   33  Text Interpretation: Atrial fibrillation When compared with ECG of 31-Oct-2022 02:51, PREVIOUS ECG IS PRESENT No acute changes Confirmed by Percy Browning [phone]) on 11/23/2023 2:07:36 PM     No results found for: LIPOA  Risk Assessment/Calculations  CHA2DS2-VASc Score = 7   This indicates a 11.2% annual risk of stroke. The patient's score is based upon: CHF History: 1 HTN History: 1 Diabetes History: 0 Stroke History: 2 Vascular Disease History: 1 Age Score: 2 Gender Score: 0            Physical Exam VS:  BP 128/74   Ht 6' (1.829 m)   Wt 212 lb (96.2 kg)   SpO2 97%   BMI 28.75 kg/m    Wt Readings from Last 3 Encounters:  11/23/23 212 lb (96.2 kg)  10/25/23 213 lb (96.6 kg)  09/04/23 215 lb (97.5 kg)    GEN: Well nourished, well developed in no acute distress NECK: No JVD; No carotid bruits CARDIAC: Irregular RR, no murmurs, rubs, gallops RESPIRATORY:  Clear to auscultation without rales, wheezing or rhonchi  ABDOMEN: Soft, non-tender, non-distended EXTREMITIES:  No edema; No deformity   Assessment & Plan Preoperative cardiovascular assessment  He is pending prostate artery  embolization. According to the Revised Cardiac Risk Index (RCRI), his Perioperative Risk of Major Cardiac Event is (%): 6.6. His Functional Capacity in METs is: 6.05 according to the Duke Activity Status Index (DASI). The patient is doing well from a cardiac perspective. Therefore, based on ACC/AHA guidelines, the  patient would be at acceptable risk for the planned procedure without further cardiovascular testing.  Reviewed guidance that he will need bridging with Lovenox around the time of the procedure and asked him to make certain he speaks with one of our pharmacist prior to holding his Eliquis .  I will forward clearance to requesting provider Per office protocol, patient can hold Eliquis  for 2 days prior to procedure.   Patient WILL need bridging with Lovenox (enoxaparin) around procedure. Once procedure is scheduled, patient will need to let us  know the date, so we can coordinate bridge.  Permanent atrial fibrillation on anticoagulation therapy History of stroke EKG reveals atrial fibrillation with well-controlled ventricular rate.  He is asymptomatic.  He denies bleeding concerns. History of stroke when off anticoagulation. Coordinate with the pharmacist for bridging anticoagulation therapy during times of interrupted OAC.  - Continue Eliquis  5 mg twice daily which is appropriate dose for stroke prevention for CHA2DS2-VASc score of 7 - Continue atenolol  for rate control -Advised ER precautions if he falls and strikes his head  CAD Hyperlipidemia LDL goal < 55 Remote history of stenting. He denies chest pain, dyspnea, or other symptoms concerning for angina.  No indication for further ischemic evaluation at this time. EKG without concerning ST/T abnormality.  Lipid panel from 05/23/2023 reveals well-controlled lipids with LDL 56.  Not on aspirin due to need for OAC - Continue losartan , atenolol , atorvastatin   Tremor He reports concern about persistent tremor. Is scheduled for evaluation soon.   Chronic left lower extremity edema   Chronic left foot drop with associated left lower extremity edema, slightly more pronounced in the left leg. He denies acute concerns. Is wearing leg compression stockings.  - Continue leg compression        Dispo: 6 months with Dr. Lonni or  APP  Signed, Rosaline Bane, NP-C "

## 2023-11-22 NOTE — Therapy (Signed)
 OUTPATIENT PHYSICAL THERAPY NEURO TREATMENT   Patient Name: William Norman MRN: [id number] DOB:[birth year], 83 y.o., male Today's Date: 11/23/2023   PCP: Kennyth Worth HERO, MD  REFERRING PROVIDER: Joane Artist RAMAN, MD   END OF SESSION:  PT End of Session - 11/23/23 1614     Visit Number 5    Number of Visits 13    Date for Recertification  12/22/23    Authorization Type [employer]    Progress Note Due on Visit 10    PT Start Time 1535    PT Stop Time 1614    PT Time Calculation (min) 39 min    Equipment Utilized During Treatment Gait belt    Activity Tolerance Patient tolerated treatment well    Behavior During Therapy WFL for tasks assessed/performed             Past Medical History:  Diagnosis Date   BPH (benign prostatic hypertrophy)    BPH associated with nocturia 08/29/2013   BPH with obstruction/lower urinary tract symptoms 08/14/2019   CAD (coronary artery disease)    CAD (coronary artery disease), native coronary artery    PTCA of RCA 1986, PTCA of LAD 1992,  Negative treadmill Cardiolite 2011    Chronic dermatitis of hands 10/12/2015   Chronic venous insufficiency    Coagulation disorder 10/26/2018   Diverticulitis    DVT (deep venous thrombosis) (HCC)    Dyslipidemia    Dysrhythmia    Eczema    Erectile dysfunction 07/11/2012   Essential hypertension    GERD 08/04/2006       GERD (gastroesophageal reflux disease)    Hyperglycemia 07/08/2019   Hyperlipidemia    Hypertension    Incomplete emptying of bladder 08/14/2019   Long term current use of anticoagulant therapy    Lumbar disc disease    Overweight (BMI 25.0-29.9) 06/18/2015   Persistent atrial fibrillation (HCC) 04/10/2018   Personal history of DVT (deep vein thrombosis)    Initially in 1998 with recurrence in 2002 now on chronic warfarin    Soft tissue lesion of foot 08/27/2015   Stroke [facility])    Urinary tract infection with hematuria 08/14/2019   Past Surgical History:   Procedure Laterality Date   APPLICATION OF WOUND VAC  09/02/2020   Procedure: APPLICATION OF WOUND VAC;  Surgeon: Belinda Cough, MD;  Location: [facility] OR;  Service: General;;   CARDIAC CATHETERIZATION     CARPAL TUNNEL RELEASE  04/21/2011   Procedure: CARPAL TUNNEL RELEASE;  Surgeon: Franky JONELLE Curia, MD;  Location: [facility];  Service: Orthopedics;  Laterality: Left;   CARPAL TUNNEL RELEASE  05/23/2011   Procedure: CARPAL TUNNEL RELEASE;  Surgeon: Franky JONELLE Curia, MD;  Location:  SURGERY CENTER;  Service: Orthopedics;  Laterality: Right;   COLON SURGERY  2010 and 2011 colostomy reversal   CORONARY ANGIOPLASTY     EYE SURGERY     repair of macular hole   IR RADIOLOGIST EVAL & MGMT  01/27/2023   LAPAROTOMY N/A 09/02/2020   Procedure: EXPLORATORY LAPAROTOMY, LYSIS OF ADHESIONS ,BOWEL RESECTION;  Surgeon: Belinda Cough, MD;  Location: [facility] OR;  Service: General;  Laterality: N/A;   LUMBAR LAMINECTOMY/DECOMPRESSION MICRODISCECTOMY Left 03/18/2021   Procedure: LAMINOTOMY, FORAMINOTOMY, LEFT LUMBAR THREE-FOUR;  Surgeon: Lanis Pupa, MD;  Location: [facility] OR;  Service: Neurosurgery;  Laterality: Left;   NASAL SEPTUM SURGERY     precutaneous transluminal coronary angioplasty     right hernia     TONSILLECTOMY AND ADENOIDECTOMY  Patient Active Problem List   Diagnosis Date Noted   Left foot drop 10/26/2023   Age-related cognitive decline 04/17/2023   Bilateral pseudophakia 04/17/2023   Chronic diastolic (congestive) heart failure (HCC) 04/17/2023   Chronic low back pain 04/17/2023   Disorder of prostate, unspecified 04/17/2023   Dry eye syndrome of bilateral lacrimal glands 04/17/2023   History of small bowel obstruction 04/17/2023   Intervertebral disc disorders with radiculopathy, lumbosacral region 04/17/2023   Localized swelling, mass and lump, left lower limb 04/17/2023   Neoplasm of uncertain behavior of skin 04/17/2023   Puckering of macula, left eye 04/17/2023    Retention of urine, unspecified 04/17/2023   Tremor 01/26/2023   UTI (urinary tract infection) 11/01/2022   SIRS (systemic inflammatory response syndrome) (HCC) 10/31/2022   Permanent atrial fibrillation (HCC) 03/29/2022   Secondary hypercoagulable state 03/29/2022   History of CVA (cerebrovascular accident) 03/29/2022   Depression, major, single episode, mild 08/24/2021   Osteoarthritis 11/20/2020   Constipation 10/02/2020   Protein-calorie malnutrition, severe 09/03/2020   SBO (small bowel obstruction) (HCC) 08/28/2020   AAA (abdominal aortic aneurysm) 03/06/2020   History of ischemic stroke 02/16/2020   DDD (degenerative disc disease), lumbosacral 02/11/2020   Recurrent UTI 08/14/2019   Hyperglycemia 07/08/2019   Persistent atrial fibrillation (HCC) 04/10/2018   Chronic dermatitis of hands 10/12/2015   Stroke (HCC) 06/18/2015   BPH associated with nocturia 08/29/2013   CAD (coronary artery disease), native coronary artery    Long term current use of anticoagulant    Personal history of DVT (deep vein thrombosis)    Chronic venous insufficiency    Erectile dysfunction 07/11/2012   Lumbar radiculopathy    Dyslipidemia    Essential hypertension     ONSET DATE: 10/25/2023 (MD referral)  REFERRING DIAG:  M21.372 (ICD-10-CM) - Left foot drop  Z74.09 (ICD-10-CM) - Impaired functional mobility, balance, gait, and endurance  R25.1 (ICD-10-CM) - Tremor    THERAPY DIAG:  Unsteadiness on feet  Muscle weakness (generalized)  Other abnormalities of gait and mobility  Rationale for Evaluation and Treatment: Rehabilitation  SUBJECTIVE:                                                                                                                                                                                             SUBJECTIVE STATEMENT: Questions about where he should get his AFO.  Pt accompanied by: self  PERTINENT HISTORY: Chronic left foot drop due to lumbar spinal  stenosis. Does not have an AFO, LUE and L face tremor  PAIN:  Are you having pain? Yes: NPRS scale: no/10 Pain location:  right low  back  Pain description: not real bad Aggravating factors: Unsure Relieving factors: tylenol   PRECAUTIONS: Fall  RED FLAGS: None   WEIGHT BEARING RESTRICTIONS: No  FALLS: Has patient fallen in last 6 months? Yes. Number of falls 1  LIVING ENVIRONMENT: Lives with: lives with their family and lives with their spouse Lives in: House/apartment Stairs: 1 step to enter; steps to basement with rails Has following equipment at home: Single point cane  PLOF: Independent and Leisure: enjoys yardwork  PATIENT GOALS: Agreeable to working on balance  OBJECTIVE:    TODAY'S TREATMENT: 11/23/23 Activity Comments  Nustep L5 x 6 min UEs/LEs  Dynamic warmup   step ups on foam Weaned to no UE support; 1 episode of mod A required to recover posterior LOB  6 step ups Often using B UE support in II bars; cues to look when you step, then stand up tall  backwards walking Cueing for long diagonal steps rather than narrow BOS with scissoring   sidestep over 1/2 foam roll Good stability and able to perform without UE support; required cueing for safe foot placement   Standing on incline/decline EO/EC Initial tendency for posterior LOB; good use of cues to push belly button forward     PATIENT EDUCATION: Education details: provided pt with [facility]'s address as he reports that he missed his orthotic appointment  Person educated: Patient Education method: Explanation, Demonstration, Tactile cues, Verbal cues, and Handouts Education comprehension: verbalized understanding and returned demonstration     HOME EXERCISE PROGRAM Last updated: 11/13/23 Access Code: [email or web address] URL: [email or web address] Date: 11/13/2023 Prepared by: [facility] - Outpatient  Rehab - [facility]  Program Notes perform standing exercises in a corner or near  handrail for safety  Exercises - Standing Gastroc Stretch at Counter  - 1-2 x daily - 7 x weekly - 1 sets - 3 reps - 30 sec hold - Seated Hamstring stretch  - 1-2 x daily - 7 x weekly - 1 sets - 3 reps - 30 sec hold - Standing Balance in Corner with Eyes Closed  - 1 x daily - 5 x weekly - 2-3 sets - 30 sec  hold - Standing Near Stance in Corner  - 1 x daily - 5 x weekly - 2-3 sets - 30 sec  hold - Standing Toe Taps  - 1 x daily - 5 x weekly - 2 sets - 10 reps    Note: Objective measures were completed at Evaluation unless otherwise noted.  DIAGNOSTIC FINDINGS: NA for this episode  COGNITION: Overall cognitive status: reports some memory issues   SENSATION: Light touch: Impaired on LLE and distally on LLE  COORDINATION: Unable to tap LLE; equal (both slightly slowed heel to shin)  POSTURE: rounded shoulders, forward head, and resting tremor LUE and face  LOWER EXTREMITY ROM:     Active  Right Eval Left Eval  Hip flexion    Hip extension    Hip abduction    Hip adduction    Hip internal rotation    Hip external rotation    Knee flexion -20 -20  Knee extension    Ankle dorsiflexion +5 A/ROM A/ROM neutral P/ROM + 5  Ankle plantarflexion    Ankle inversion    Ankle eversion     (Blank rows = not tested)  LOWER EXTREMITY MMT:    MMT Right Eval Left Eval  Hip flexion 4 3+  Hip extension    Hip abduction    Hip adduction  Hip internal rotation    Hip external rotation    Knee flexion 4 3+  Knee extension 4 4  Ankle dorsiflexion 3+ 1  Ankle plantarflexion 3+ 3  Ankle inversion    Ankle eversion    (Blank rows = not tested)  TRANSFERS: Sit to stand: Modified independence  Assistive device utilized: BUE support     Stand to sit: Modified independence  Assistive device utilized: BUE support      GAIT: Findings: Gait Characteristics: step through pattern, decreased ankle dorsiflexion- Left, Left steppage, and poor foot clearance- Left, Distance walked: 50  ft, Assistive device utilized:cane, and Level of assistance: SBA  FUNCTIONAL TESTS:  5 times sit to stand: 26.46 sec with BUE support Timed up and go (TUG): 25.44 sec no cane, unsteadiness with turns 10 meter walk test: 16.6 sec = 1.98 ft/sec Berg Balance test:32/56 (Scores <45/56 indicate increased fall risk)                                                                                                                                TREATMENT DATE: 11/08/2023    PATIENT EDUCATION: Education details: Eval results, POC, initial HEP Person educated: Patient Education method: Explanation, Demonstration, Verbal cues, and Handouts Education comprehension: verbalized understanding, returned demonstration, and needs further education  HOME EXERCISE PROGRAM: Access Code: [email or web address] URL: [email or web address] Date: 11/08/2023 Prepared by: [facility] - Outpatient  Rehab - [facility]  Exercises - Standing Gastroc Stretch at Counter  - 1-2 x daily - 7 x weekly - 1 sets - 3 reps - 30 sec hold - Seated Hamstring stretch  - 1-2 x daily - 7 x weekly - 1 sets - 3 reps - 30 sec hold  GOALS: Goals reviewed with patient? Yes  SHORT TERM GOALS: Target date: 11/24/2023  Pt will be independent with HEP for improved balance, strength, gait. Baseline: Goal status: IN PROGRESS  2.  Pt will improve 5x sit<>stand to less than or equal to 20 sec to demonstrate improved functional strength and transfer efficiency. Baseline: 26.46 sec Goal status: IN PROGRESS  LONG TERM GOALS: Target date: 12/22/2023  Pt will be independent with progression of HEP for improved strength, balance, gait  . Baseline:  Goal status: IN PROGRESS  2.  Pt will improve 5x sit<>stand to less than or equal to 15 sec to demonstrate improved functional strength and transfer efficiency. Baseline:  Goal status: IN PROGRESS  3.  Pt will improve TUG score to less than or equal to 18 sec for decreased fall  risk. Baseline: 25.44 sec Goal status: IN PROGRESS  4.  Pt will improve Berg score to at least 45/56 to decrease fall risk. Baseline: 32/56 Goal status: IN PROGRESS  5.  Pt will improve gait velocity to at least 2.3 ft/sec  for improved gait efficiency and safety. Baseline: 1.98 ft/sec Goal status: IN PROGRESS  6.  Pt will verbalize understanding of fall prevention in home  environment. Baseline:  Goal status: IN PROGRESS  ASSESSMENT:  CLINICAL IMPRESSION: Patient arrived to session with continued questions about where to get his AFO, thus directed pt to [facility] clinic as he reports missing an appointment here. Proceeded with dynamic balance challenges with steps, compliant surface, and multidirectional stepping. Patient required frequent cues to improve awareness of safe stepping placement and required cueing to correct posterior LOB on uneven surfaces. Good effort with sessions and no complaints upon leaving.   OBJECTIVE IMPAIRMENTS: Abnormal gait, decreased balance, decreased mobility, difficulty walking, decreased ROM, decreased strength, impaired flexibility, and postural dysfunction.   ACTIVITY LIMITATIONS: standing, transfers, and locomotion level  PARTICIPATION LIMITATIONS: community activity and yard work  PERSONAL FACTORS: 3+ comorbidities: see above are also affecting patient's functional outcome.   REHAB POTENTIAL: Good  CLINICAL DECISION MAKING: Evolving/moderate complexity  EVALUATION COMPLEXITY: Moderate  PLAN:  PT FREQUENCY: 2x/week  PT DURATION: 6 weeks plus eval  PLANNED INTERVENTIONS: 97750- Physical Performance Testing, 97110-Therapeutic exercises, 97530- Therapeutic activity, 97112- Neuromuscular re-education, 97535- Self Care, 02859- Manual therapy, [phone]- Gait training, [phone]- Orthotic Initial, [phone]- Orthotic/Prosthetic subsequent, Patient/Family education, and Balance training  PLAN FOR NEXT SESSION: balance on compliant surface;  trial AFO, f/u on  AFO order from Dr. Joane? Theresa messaged him 11/20/23 via staff message)    Louana Terrilyn Christians, PT, DPT 11/23/23 4:16 PM  [facility] at [facility] [address] [place], [place] [address] Phone # [phone] Fax # [phone]

## 2023-11-23 ENCOUNTER — Encounter: Payer: Self-pay | Admitting: Physical Therapy

## 2023-11-23 ENCOUNTER — Ambulatory Visit: Admitting: Physical Therapy

## 2023-11-23 ENCOUNTER — Encounter (HOSPITAL_BASED_OUTPATIENT_CLINIC_OR_DEPARTMENT_OTHER): Payer: Self-pay | Admitting: Nurse Practitioner

## 2023-11-23 ENCOUNTER — Ambulatory Visit (HOSPITAL_BASED_OUTPATIENT_CLINIC_OR_DEPARTMENT_OTHER): Admitting: Nurse Practitioner

## 2023-11-23 VITALS — BP 128/74 | Ht 72.0 in | Wt 212.0 lb

## 2023-11-23 DIAGNOSIS — E785 Hyperlipidemia, unspecified: Secondary | ICD-10-CM

## 2023-11-23 DIAGNOSIS — Z8673 Personal history of transient ischemic attack (TIA), and cerebral infarction without residual deficits: Secondary | ICD-10-CM

## 2023-11-23 DIAGNOSIS — I4821 Permanent atrial fibrillation: Secondary | ICD-10-CM | POA: Diagnosis not present

## 2023-11-23 DIAGNOSIS — Z7901 Long term (current) use of anticoagulants: Secondary | ICD-10-CM | POA: Diagnosis not present

## 2023-11-23 DIAGNOSIS — R2681 Unsteadiness on feet: Secondary | ICD-10-CM

## 2023-11-23 DIAGNOSIS — I872 Venous insufficiency (chronic) (peripheral): Secondary | ICD-10-CM | POA: Diagnosis not present

## 2023-11-23 DIAGNOSIS — Z01818 Encounter for other preprocedural examination: Secondary | ICD-10-CM

## 2023-11-23 DIAGNOSIS — R251 Tremor, unspecified: Secondary | ICD-10-CM | POA: Diagnosis not present

## 2023-11-23 DIAGNOSIS — I2510 Atherosclerotic heart disease of native coronary artery without angina pectoris: Secondary | ICD-10-CM | POA: Diagnosis not present

## 2023-11-23 DIAGNOSIS — R2689 Other abnormalities of gait and mobility: Secondary | ICD-10-CM | POA: Diagnosis not present

## 2023-11-23 DIAGNOSIS — M6281 Muscle weakness (generalized): Secondary | ICD-10-CM | POA: Diagnosis not present

## 2023-11-23 NOTE — Patient Instructions (Signed)
 Medication Instructions:   Your physician recommends that you continue on your current medications as directed. Please refer to the Current Medication list given to you today.   *If you need a refill on your cardiac medications before your next appointment, please call your pharmacy*  Lab Work:  None ordered.  If you have labs (blood work) drawn today and your tests are completely normal, you will receive your results only by: MyChart Message (if you have MyChart) OR A paper copy in the mail If you have any lab test that is abnormal or we need to change your treatment, we will call you to review the results.  Testing/Procedures:  None ordered.  Follow-Up: At [facility], you and your health needs are our priority.  As part of our continuing mission to provide you with exceptional heart care, our providers are all part of one team.  This team includes your primary Cardiologist (physician) and Advanced Practice Providers or APPs (Physician Assistants and Nurse Practitioners) who all work together to provide you with the care you need, when you need it.  Your next appointment:   6 month(s)  Provider:   Sheryle Donning, MD, Slater Duncan, NP, or Neomi Banks, NP    We recommend signing up for the patient portal called "MyChart".  Sign up information is provided on this After Visit Summary.  MyChart is used to connect with patients for Virtual Visits (Telemedicine).  Patients are able to view lab/test results, encounter notes, upcoming appointments, etc.  Non-urgent messages can be sent to your provider as well.   To learn more about what you can do with MyChart, go to [email or web address].   Other Instructions  Your physician wants you to follow-up in: 6 months.  You will receive a reminder letter in the mail two months in advance. If you don't receive a letter, please call our office to schedule the follow-up appointment.

## 2023-11-23 NOTE — Progress Notes (Signed)
 William Norman                                          MRN: [id number]   11/23/2023   The [facility] Quality Team Specialist reviewed this patient medical record for the purposes of chart review for care gap closure. The following were reviewed: chart review for care gap closure-controlling blood pressure. 140/78 at last [place] office visit.     [facility] Quality Team

## 2023-11-28 ENCOUNTER — Ambulatory Visit: Admitting: Physical Therapy

## 2023-11-28 ENCOUNTER — Encounter: Payer: Self-pay | Admitting: Physical Therapy

## 2023-11-28 DIAGNOSIS — M6281 Muscle weakness (generalized): Secondary | ICD-10-CM | POA: Diagnosis not present

## 2023-11-28 DIAGNOSIS — R2689 Other abnormalities of gait and mobility: Secondary | ICD-10-CM

## 2023-11-28 DIAGNOSIS — R2681 Unsteadiness on feet: Secondary | ICD-10-CM | POA: Diagnosis not present

## 2023-11-28 NOTE — Therapy (Signed)
 OUTPATIENT PHYSICAL THERAPY NEURO TREATMENT   Patient Name: William Norman MRN: [id number] DOB:[birth year], 83 y.o., male Today's Date: 11/28/2023   PCP: Kennyth Worth HERO, MD  REFERRING PROVIDER: Joane Artist RAMAN, MD   END OF SESSION:  PT End of Session - 11/28/23 1227     Visit Number 6    Number of Visits 13    Date for Recertification  12/22/23    Authorization Type [employer]    Progress Note Due on Visit 10    PT Start Time 1231    PT Stop Time 1313    PT Time Calculation (min) 42 min    Equipment Utilized During Treatment Gait belt    Activity Tolerance Patient tolerated treatment well    Behavior During Therapy WFL for tasks assessed/performed              Past Medical History:  Diagnosis Date   BPH (benign prostatic hypertrophy)    BPH associated with nocturia 08/29/2013   BPH with obstruction/lower urinary tract symptoms 08/14/2019   CAD (coronary artery disease)    CAD (coronary artery disease), native coronary artery    PTCA of RCA 1986, PTCA of LAD 1992,  Negative treadmill Cardiolite 2011    Chronic dermatitis of hands 10/12/2015   Chronic venous insufficiency    Coagulation disorder 10/26/2018   Diverticulitis    DVT (deep venous thrombosis) (HCC)    Dyslipidemia    Dysrhythmia    Eczema    Erectile dysfunction 07/11/2012   Essential hypertension    GERD 08/04/2006       GERD (gastroesophageal reflux disease)    Hyperglycemia 07/08/2019   Hyperlipidemia    Hypertension    Incomplete emptying of bladder 08/14/2019   Long term current use of anticoagulant therapy    Lumbar disc disease    Overweight (BMI 25.0-29.9) 06/18/2015   Persistent atrial fibrillation (HCC) 04/10/2018   Personal history of DVT (deep vein thrombosis)    Initially in 1998 with recurrence in 2002 now on chronic warfarin    Soft tissue lesion of foot 08/27/2015   Stroke [facility])    Urinary tract infection with hematuria 08/14/2019   Past Surgical History:   Procedure Laterality Date   APPLICATION OF WOUND VAC  09/02/2020   Procedure: APPLICATION OF WOUND VAC;  Surgeon: Belinda Cough, MD;  Location: [facility] OR;  Service: General;;   CARDIAC CATHETERIZATION     CARPAL TUNNEL RELEASE  04/21/2011   Procedure: CARPAL TUNNEL RELEASE;  Surgeon: Franky JONELLE Curia, MD;  Location: [facility];  Service: Orthopedics;  Laterality: Left;   CARPAL TUNNEL RELEASE  05/23/2011   Procedure: CARPAL TUNNEL RELEASE;  Surgeon: Franky JONELLE Curia, MD;  Location: [facility];  Service: Orthopedics;  Laterality: Right;   COLON SURGERY  2010 and 2011 colostomy reversal   CORONARY ANGIOPLASTY     EYE SURGERY     repair of macular hole   IR RADIOLOGIST EVAL & MGMT  01/27/2023   IR RADIOLOGIST EVAL & MGMT  10/04/2023   LAPAROTOMY N/A 09/02/2020   Procedure: EXPLORATORY LAPAROTOMY, LYSIS OF ADHESIONS ,BOWEL RESECTION;  Surgeon: Belinda Cough, MD;  Location: [facility] OR;  Service: General;  Laterality: N/A;   LUMBAR LAMINECTOMY/DECOMPRESSION MICRODISCECTOMY Left 03/18/2021   Procedure: LAMINOTOMY, FORAMINOTOMY, LEFT LUMBAR THREE-FOUR;  Surgeon: Lanis Pupa, MD;  Location: [facility] OR;  Service: Neurosurgery;  Laterality: Left;   NASAL SEPTUM SURGERY     precutaneous transluminal coronary angioplasty  right hernia     TONSILLECTOMY AND ADENOIDECTOMY     Patient Active Problem List   Diagnosis Date Noted   Left foot drop 10/26/2023   Age-related cognitive decline 04/17/2023   Bilateral pseudophakia 04/17/2023   Chronic diastolic (congestive) heart failure (HCC) 04/17/2023   Chronic low back pain 04/17/2023   Disorder of prostate, unspecified 04/17/2023   Dry eye syndrome of bilateral lacrimal glands 04/17/2023   History of small bowel obstruction 04/17/2023   Intervertebral disc disorders with radiculopathy, lumbosacral region 04/17/2023   Localized swelling, mass and lump, left lower limb 04/17/2023   Neoplasm of uncertain behavior of skin 04/17/2023    Puckering of macula, left eye 04/17/2023   Retention of urine, unspecified 04/17/2023   Tremor 01/26/2023   UTI (urinary tract infection) 11/01/2022   SIRS (systemic inflammatory response syndrome) (HCC) 10/31/2022   Permanent atrial fibrillation (HCC) 03/29/2022   Secondary hypercoagulable state 03/29/2022   History of CVA (cerebrovascular accident) 03/29/2022   Depression, major, single episode, mild 08/24/2021   Osteoarthritis 11/20/2020   Constipation 10/02/2020   Protein-calorie malnutrition, severe 09/03/2020   SBO (small bowel obstruction) (HCC) 08/28/2020   AAA (abdominal aortic aneurysm) 03/06/2020   History of ischemic stroke 02/16/2020   DDD (degenerative disc disease), lumbosacral 02/11/2020   Recurrent UTI 08/14/2019   Hyperglycemia 07/08/2019   Persistent atrial fibrillation (HCC) 04/10/2018   Chronic dermatitis of hands 10/12/2015   Stroke (HCC) 06/18/2015   BPH associated with nocturia 08/29/2013   CAD (coronary artery disease), native coronary artery    Long term current use of anticoagulant    Personal history of DVT (deep vein thrombosis)    Chronic venous insufficiency    Erectile dysfunction 07/11/2012   Lumbar radiculopathy    Dyslipidemia    Essential hypertension     ONSET DATE: 10/25/2023 (MD referral)  REFERRING DIAG:  M21.372 (ICD-10-CM) - Left foot drop  Z74.09 (ICD-10-CM) - Impaired functional mobility, balance, gait, and endurance  R25.1 (ICD-10-CM) - Tremor    THERAPY DIAG:  Unsteadiness on feet  Muscle weakness (generalized)  Other abnormalities of gait and mobility  Rationale for Evaluation and Treatment: Rehabilitation  SUBJECTIVE:                                                                                                                                                                                             SUBJECTIVE STATEMENT: Have appointment with [facility] on 11/4.  No falls, just balance is bothering me.  Pt  accompanied by: self  PERTINENT HISTORY: Chronic left foot drop due to lumbar spinal stenosis. Does not have an AFO, LUE  and L face tremor  PAIN:  Are you having pain? Yes: NPRS scale: no/10 Pain location:  right low back  Pain description: not real bad Aggravating factors: Unsure Relieving factors: tylenol   PRECAUTIONS: Fall  RED FLAGS: None   WEIGHT BEARING RESTRICTIONS: No  FALLS: Has patient fallen in last 6 months? Yes. Number of falls 1  LIVING ENVIRONMENT: Lives with: lives with their family and lives with their spouse Lives in: House/apartment Stairs: 1 step to enter; steps to basement with rails Has following equipment at home: Single point cane  PLOF: Independent and Leisure: enjoys yardwork  PATIENT GOALS: Agreeable to working on balance  OBJECTIVE:    TODAY'S TREATMENT: 11/28/2023 Activity Comments  NuStep, Level 5, x 8 minutes, 4 extremities  SPM >70-80 for dynamic warm up  5x sit to stand:  21.34 sec without UE support Compared to 26.46 sec with definite UEs  HEP review Cues for technique; pt reports he isn't doing them regularly  Step ups to 6 step, 10 reps each leg leading *Standing activities performed wearing clinic L AFO  Forward/back walking along the counter 4 reps   Side stepping along counter, 3 reps   Forward/back stepping, 10 reps   Gait with SPC, with clinic AFO trial on LLE, 85 ft x 4 reps Improved gait pattern with decreased steppage pattern and increased step length bilat.      PATIENT EDUCATION: Education details: Progress towards goals, POC, review of HEP and reviewed benefits of consistently performing   Person educated: Patient Education method: Explanation, Demonstration, Tactile cues, Verbal cues, and Handouts Education comprehension: verbalized understanding and returned demonstration     HOME EXERCISE PROGRAM Last updated: 11/13/23 Access Code: [email or web address] URL: [email or web address] Date:  11/13/2023 Prepared by: [facility] - Outpatient  Rehab - [facility]  Program Notes perform standing exercises in a corner or near handrail for safety  Exercises - Standing Gastroc Stretch at Counter  - 1-2 x daily - 7 x weekly - 1 sets - 3 reps - 30 sec hold - Seated Hamstring stretch  - 1-2 x daily - 7 x weekly - 1 sets - 3 reps - 30 sec hold - Standing Balance in Corner with Eyes Closed  - 1 x daily - 5 x weekly - 2-3 sets - 30 sec  hold - Standing Near Stance in Corner  - 1 x daily - 5 x weekly - 2-3 sets - 30 sec  hold - Standing Toe Taps  - 1 x daily - 5 x weekly - 2 sets - 10 reps    Note: Objective measures were completed at Evaluation unless otherwise noted.  DIAGNOSTIC FINDINGS: NA for this episode  COGNITION: Overall cognitive status: reports some memory issues   SENSATION: Light touch: Impaired on LLE and distally on LLE  COORDINATION: Unable to tap LLE; equal (both slightly slowed heel to shin)  POSTURE: rounded shoulders, forward head, and resting tremor LUE and face  LOWER EXTREMITY ROM:     Active  Right Eval Left Eval  Hip flexion    Hip extension    Hip abduction    Hip adduction    Hip internal rotation    Hip external rotation    Knee flexion -20 -20  Knee extension    Ankle dorsiflexion +5 A/ROM A/ROM neutral P/ROM + 5  Ankle plantarflexion    Ankle inversion    Ankle eversion     (Blank rows = not tested)  LOWER EXTREMITY MMT:  MMT Right Eval Left Eval  Hip flexion 4 3+  Hip extension    Hip abduction    Hip adduction    Hip internal rotation    Hip external rotation    Knee flexion 4 3+  Knee extension 4 4  Ankle dorsiflexion 3+ 1  Ankle plantarflexion 3+ 3  Ankle inversion    Ankle eversion    (Blank rows = not tested)  TRANSFERS: Sit to stand: Modified independence  Assistive device utilized: BUE support     Stand to sit: Modified independence  Assistive device utilized: BUE support      GAIT: Findings: Gait  Characteristics: step through pattern, decreased ankle dorsiflexion- Left, Left steppage, and poor foot clearance- Left, Distance walked: 50 ft, Assistive device utilized:cane, and Level of assistance: SBA  FUNCTIONAL TESTS:  5 times sit to stand: 26.46 sec with BUE support Timed up and go (TUG): 25.44 sec no cane, unsteadiness with turns 10 meter walk test: 16.6 sec = 1.98 ft/sec Berg Balance test:32/56 (Scores <45/56 indicate increased fall risk)                                                                                                                                TREATMENT DATE: 11/08/2023    PATIENT EDUCATION: Education details: Eval results, POC, initial HEP Person educated: Patient Education method: Explanation, Demonstration, Verbal cues, and Handouts Education comprehension: verbalized understanding, returned demonstration, and needs further education  HOME EXERCISE PROGRAM: Access Code: [email or web address] URL: [email or web address] Date: 11/08/2023 Prepared by: [facility] - Outpatient  Rehab - [facility]  Exercises - Standing Gastroc Stretch at Counter  - 1-2 x daily - 7 x weekly - 1 sets - 3 reps - 30 sec hold - Seated Hamstring stretch  - 1-2 x daily - 7 x weekly - 1 sets - 3 reps - 30 sec hold  GOALS: Goals reviewed with patient? Yes  SHORT TERM GOALS: Target date: 11/24/2023  Pt will be independent with HEP for improved balance, strength, gait. Baseline: Goal status: NOT MET 10/14/025  2.  Pt will improve 5x sit<>stand to less than or equal to 20 sec to demonstrate improved functional strength and transfer efficiency. Baseline: 26.46 sec>21.34 sec 11/28/2023 Goal status: PARTIALLY MET, 11/28/2023  LONG TERM GOALS: Target date: 12/22/2023  Pt will be independent with progression of HEP for improved strength, balance, gait  . Baseline:  Goal status: IN PROGRESS  2.  Pt will improve 5x sit<>stand to less than or equal to 15 sec to demonstrate  improved functional strength and transfer efficiency. Baseline:  Goal status: IN PROGRESS  3.  Pt will improve TUG score to less than or equal to 18 sec for decreased fall risk. Baseline: 25.44 sec Goal status: IN PROGRESS  4.  Pt will improve Berg score to at least 45/56 to decrease fall risk. Baseline: 32/56 Goal status: IN PROGRESS  5.  Pt will improve gait velocity to  at least 2.3 ft/sec  for improved gait efficiency and safety. Baseline: 1.98 ft/sec Goal status: IN PROGRESS  6.  Pt will verbalize understanding of fall prevention in home environment. Baseline:  Goal status: IN PROGRESS  ASSESSMENT:  CLINICAL IMPRESSION: Pt presents today with no new complaints. Skilled PT session focused on flexibility/strength warm up, + checking STGs + gait and balance activities with trial again of LLE walk-on AFO (clinic trial).  Reviewed HEP and pt needs cues for correct technique, reporting he is not doing these much at home; thus STG 1 not met.  Educated pt in rationale/benefit of consistent HEP performance.  STG 2 partially met, as he has improved 5x sit to stand score from >26 sec to 21.34 sec, just not to goal level.  Performed standing exercises with pt using trial LLE AFO, where he has improved foot clearance and stance time, overall gait pattern.  *He does note 11/4 appt at [facility] and 10/23 appt with Dr. Evonnie (to assess for Parkinson's per pt report).  He will continue to benefit from skilled PT towards goals for improved functional mobility and decreased fall risk.  OBJECTIVE IMPAIRMENTS: Abnormal gait, decreased balance, decreased mobility, difficulty walking, decreased ROM, decreased strength, impaired flexibility, and postural dysfunction.   ACTIVITY LIMITATIONS: standing, transfers, and locomotion level  PARTICIPATION LIMITATIONS: community activity and yard work  PERSONAL FACTORS: 3+ comorbidities: see above are also affecting patient's functional outcome.   REHAB POTENTIAL:  Good  CLINICAL DECISION MAKING: Evolving/moderate complexity  EVALUATION COMPLEXITY: Moderate  PLAN:  PT FREQUENCY: 2x/week  PT DURATION: 6 weeks plus eval  PLANNED INTERVENTIONS: 97750- Physical Performance Testing, 97110-Therapeutic exercises, 97530- Therapeutic activity, 97112- Neuromuscular re-education, 97535- Self Care, 02859- Manual therapy, [phone]- Gait training, [phone]- Orthotic Initial, [phone]- Orthotic/Prosthetic subsequent, Patient/Family education, and Balance training  PLAN FOR NEXT SESSION: Continue strength and balance; balance on compliant surface;  trial AFO, f/u on AFO order from Dr. Joane? Theresa messaged him 11/20/23 via staff message)    Greig Anon, PT 11/28/23 1:20 PM Phone: [phone] Fax: [phone]  [facility] at [facility] [address] [place], [place] [address] Phone # [phone] Fax # [phone]

## 2023-11-30 ENCOUNTER — Ambulatory Visit

## 2023-12-01 ENCOUNTER — Other Ambulatory Visit (HOSPITAL_BASED_OUTPATIENT_CLINIC_OR_DEPARTMENT_OTHER): Payer: Self-pay | Admitting: Cardiology

## 2023-12-01 DIAGNOSIS — I10 Essential (primary) hypertension: Secondary | ICD-10-CM

## 2023-12-04 ENCOUNTER — Ambulatory Visit

## 2023-12-04 DIAGNOSIS — R2681 Unsteadiness on feet: Secondary | ICD-10-CM | POA: Diagnosis not present

## 2023-12-04 DIAGNOSIS — R2689 Other abnormalities of gait and mobility: Secondary | ICD-10-CM | POA: Diagnosis not present

## 2023-12-04 DIAGNOSIS — M6281 Muscle weakness (generalized): Secondary | ICD-10-CM | POA: Diagnosis not present

## 2023-12-04 NOTE — Therapy (Signed)
 OUTPATIENT PHYSICAL THERAPY NEURO TREATMENT   Patient Name: William Norman MRN: [id number] DOB:1940/04/21, 83 y.o., male Today's Date: 12/04/2023   PCP: Kennyth Worth HERO, MD  REFERRING PROVIDER: Joane Artist RAMAN, MD   END OF SESSION:  PT End of Session - 12/04/23 1620     Visit Number 7    Number of Visits 13    Date for Recertification  12/22/23    Authorization Type [employer]    Progress Note Due on Visit 10    PT Start Time 1620    PT Stop Time 1700    PT Time Calculation (min) 40 min    Equipment Utilized During Treatment Gait belt    Activity Tolerance Patient tolerated treatment well    Behavior During Therapy WFL for tasks assessed/performed              Past Medical History:  Diagnosis Date   BPH (benign prostatic hypertrophy)    BPH associated with nocturia 08/29/2013   BPH with obstruction/lower urinary tract symptoms 08/14/2019   CAD (coronary artery disease)    CAD (coronary artery disease), native coronary artery    PTCA of RCA 1986, PTCA of LAD 1992,  Negative treadmill Cardiolite 2011    Chronic dermatitis of hands 10/12/2015   Chronic venous insufficiency    Coagulation disorder 10/26/2018   Diverticulitis    DVT (deep venous thrombosis) (HCC)    Dyslipidemia    Dysrhythmia    Eczema    Erectile dysfunction 07/11/2012   Essential hypertension    GERD 08/04/2006       GERD (gastroesophageal reflux disease)    Hyperglycemia 07/08/2019   Hyperlipidemia    Hypertension    Incomplete emptying of bladder 08/14/2019   Long term current use of anticoagulant therapy    Lumbar disc disease    Overweight (BMI 25.0-29.9) 06/18/2015   Persistent atrial fibrillation (HCC) 04/10/2018   Personal history of DVT (deep vein thrombosis)    Initially in 1998 with recurrence in 2002 now on chronic warfarin    Soft tissue lesion of foot 08/27/2015   Stroke [facility])    Urinary tract infection with hematuria 08/14/2019   Past Surgical History:   Procedure Laterality Date   APPLICATION OF WOUND VAC  09/02/2020   Procedure: APPLICATION OF WOUND VAC;  Surgeon: Belinda Cough, MD;  Location: [facility] OR;  Service: General;;   CARDIAC CATHETERIZATION     CARPAL TUNNEL RELEASE  04/21/2011   Procedure: CARPAL TUNNEL RELEASE;  Surgeon: Franky JONELLE Curia, MD;  Location: [facility];  Service: Orthopedics;  Laterality: Left;   CARPAL TUNNEL RELEASE  05/23/2011   Procedure: CARPAL TUNNEL RELEASE;  Surgeon: Franky JONELLE Curia, MD;  Location: [facility];  Service: Orthopedics;  Laterality: Right;   COLON SURGERY  2010 and 2011 colostomy reversal   CORONARY ANGIOPLASTY     EYE SURGERY     repair of macular hole   IR RADIOLOGIST EVAL & MGMT  01/27/2023   IR RADIOLOGIST EVAL & MGMT  10/04/2023   LAPAROTOMY N/A 09/02/2020   Procedure: EXPLORATORY LAPAROTOMY, LYSIS OF ADHESIONS ,BOWEL RESECTION;  Surgeon: Belinda Cough, MD;  Location: [facility] OR;  Service: General;  Laterality: N/A;   LUMBAR LAMINECTOMY/DECOMPRESSION MICRODISCECTOMY Left 03/18/2021   Procedure: LAMINOTOMY, FORAMINOTOMY, LEFT LUMBAR THREE-FOUR;  Surgeon: Lanis Pupa, MD;  Location: [facility] OR;  Service: Neurosurgery;  Laterality: Left;   NASAL SEPTUM SURGERY     precutaneous transluminal coronary angioplasty  right hernia     TONSILLECTOMY AND ADENOIDECTOMY     Patient Active Problem List   Diagnosis Date Noted   Left foot drop 10/26/2023   Age-related cognitive decline 04/17/2023   Bilateral pseudophakia 04/17/2023   Chronic diastolic (congestive) heart failure (HCC) 04/17/2023   Chronic low back pain 04/17/2023   Disorder of prostate, unspecified 04/17/2023   Dry eye syndrome of bilateral lacrimal glands 04/17/2023   History of small bowel obstruction 04/17/2023   Intervertebral disc disorders with radiculopathy, lumbosacral region 04/17/2023   Localized swelling, mass and lump, left lower limb 04/17/2023   Neoplasm of uncertain behavior of skin 04/17/2023    Puckering of macula, left eye 04/17/2023   Retention of urine, unspecified 04/17/2023   Tremor 01/26/2023   UTI (urinary tract infection) 11/01/2022   SIRS (systemic inflammatory response syndrome) (HCC) 10/31/2022   Permanent atrial fibrillation (HCC) 03/29/2022   Secondary hypercoagulable state 03/29/2022   History of CVA (cerebrovascular accident) 03/29/2022   Depression, major, single episode, mild 08/24/2021   Osteoarthritis 11/20/2020   Constipation 10/02/2020   Protein-calorie malnutrition, severe 09/03/2020   SBO (small bowel obstruction) (HCC) 08/28/2020   AAA (abdominal aortic aneurysm) 03/06/2020   History of ischemic stroke 02/16/2020   DDD (degenerative disc disease), lumbosacral 02/11/2020   Recurrent UTI 08/14/2019   Hyperglycemia 07/08/2019   Persistent atrial fibrillation (HCC) 04/10/2018   Chronic dermatitis of hands 10/12/2015   Stroke (HCC) 06/18/2015   BPH associated with nocturia 08/29/2013   CAD (coronary artery disease), native coronary artery    Long term current use of anticoagulant    Personal history of DVT (deep vein thrombosis)    Chronic venous insufficiency    Erectile dysfunction 07/11/2012   Lumbar radiculopathy    Dyslipidemia    Essential hypertension     ONSET DATE: 10/25/2023 (MD referral)  REFERRING DIAG:  M21.372 (ICD-10-CM) - Left foot drop  Z74.09 (ICD-10-CM) - Impaired functional mobility, balance, gait, and endurance  R25.1 (ICD-10-CM) - Tremor    THERAPY DIAG:  Unsteadiness on feet  Muscle weakness (generalized)  Other abnormalities of gait and mobility  Rationale for Evaluation and Treatment: Rehabilitation  SUBJECTIVE:                                                                                                                                                                                             SUBJECTIVE STATEMENT: Did a bit of yard work over the weekend and got the back aggravated. Met with [facility] and  expecting call in 1-2 weeks  Pt accompanied by: self  PERTINENT HISTORY: Chronic left foot drop due to lumbar  spinal stenosis. Does not have an AFO, LUE and L face tremor  PAIN:  Are you having pain? Yes: NPRS scale: 7/10 Pain location:  right low back  Pain description: aggravated Aggravating factors: Unsure Relieving factors: tylenol   PRECAUTIONS: Fall  RED FLAGS: None   WEIGHT BEARING RESTRICTIONS: No  FALLS: Has patient fallen in last 6 months? Yes. Number of falls 1  LIVING ENVIRONMENT: Lives with: lives with their family and lives with their spouse Lives in: House/apartment Stairs: 1 step to enter; steps to basement with rails Has following equipment at home: Single point cane  PLOF: Independent and Leisure: enjoys yardwork  PATIENT GOALS: Agreeable to working on balance  OBJECTIVE:   TODAY'S TREATMENT: 12/04/23 Activity Comments  NU-step level 5 x 8 min MHP to low back to reduce pain and improve activity tolerance.   Standing ankle DF 3x10 BUE support on rails, feet on slope (toes down), and mirror for feedback  Standing balance -feet apart EO/EC on foam 2x30 sec. Weight shift lateral, ant-post x 60 sec -alt toe taps x 45 sec: BUE support, single UE, no UE -forwards/backwards hurdles: BUE, single UE, no UE support  Gastroc stretch 3x60 sec On slantboard to reduce contracture/incr DF ROM for gait mechanics  Back pain 1-2/10 end of session         TODAY'S TREATMENT: 11/28/2023 Activity Comments  NuStep, Level 5, x 8 minutes, 4 extremities  SPM >70-80 for dynamic warm up  5x sit to stand:  21.34 sec without UE support Compared to 26.46 sec with definite UEs  HEP review Cues for technique; pt reports he isn't doing them regularly  Step ups to 6 step, 10 reps each leg leading *Standing activities performed wearing clinic L AFO  Forward/back walking along the counter 4 reps   Side stepping along counter, 3 reps   Forward/back stepping, 10 reps   Gait with SPC,  with clinic AFO trial on LLE, 85 ft x 4 reps Improved gait pattern with decreased steppage pattern and increased step length bilat.      PATIENT EDUCATION: Education details: Progress towards goals, POC, review of HEP and reviewed benefits of consistently performing   Person educated: Patient Education method: Explanation, Demonstration, Tactile cues, Verbal cues, and Handouts Education comprehension: verbalized understanding and returned demonstration     HOME EXERCISE PROGRAM Last updated: 11/13/23 Access Code: [email or web address] URL: [email or web address] Date: 11/13/2023 Prepared by: [facility] - Outpatient  Rehab - [facility]  Program Notes perform standing exercises in a corner or near handrail for safety  Exercises - Standing Gastroc Stretch at Counter  - 1-2 x daily - 7 x weekly - 1 sets - 3 reps - 30 sec hold - Seated Hamstring stretch  - 1-2 x daily - 7 x weekly - 1 sets - 3 reps - 30 sec hold - Standing Balance in Corner with Eyes Closed  - 1 x daily - 5 x weekly - 2-3 sets - 30 sec  hold - Standing Near Stance in Corner  - 1 x daily - 5 x weekly - 2-3 sets - 30 sec  hold - Standing Toe Taps  - 1 x daily - 5 x weekly - 2 sets - 10 reps    Note: Objective measures were completed at Evaluation unless otherwise noted.  DIAGNOSTIC FINDINGS: NA for this episode  COGNITION: Overall cognitive status: reports some memory issues   SENSATION: Light touch: Impaired on LLE and distally on LLE  COORDINATION: Unable to tap LLE;  equal (both slightly slowed heel to shin)  POSTURE: rounded shoulders, forward head, and resting tremor LUE and face  LOWER EXTREMITY ROM:     Active  Right Eval Left Eval  Hip flexion    Hip extension    Hip abduction    Hip adduction    Hip internal rotation    Hip external rotation    Knee flexion -20 -20  Knee extension    Ankle dorsiflexion +5 A/ROM A/ROM neutral P/ROM + 5  Ankle plantarflexion    Ankle inversion     Ankle eversion     (Blank rows = not tested)  LOWER EXTREMITY MMT:    MMT Right Eval Left Eval  Hip flexion 4 3+  Hip extension    Hip abduction    Hip adduction    Hip internal rotation    Hip external rotation    Knee flexion 4 3+  Knee extension 4 4  Ankle dorsiflexion 3+ 1  Ankle plantarflexion 3+ 3  Ankle inversion    Ankle eversion    (Blank rows = not tested)  TRANSFERS: Sit to stand: Modified independence  Assistive device utilized: BUE support     Stand to sit: Modified independence  Assistive device utilized: BUE support      GAIT: Findings: Gait Characteristics: step through pattern, decreased ankle dorsiflexion- Left, Left steppage, and poor foot clearance- Left, Distance walked: 50 ft, Assistive device utilized:cane, and Level of assistance: SBA  FUNCTIONAL TESTS:  5 times sit to stand: 26.46 sec with BUE support Timed up and go (TUG): 25.44 sec no cane, unsteadiness with turns 10 meter walk test: 16.6 sec = 1.98 ft/sec Berg Balance test:32/56 (Scores <45/56 indicate increased fall risk)                                                                                                                                TREATMENT DATE: 11/08/2023    PATIENT EDUCATION: Education details: Eval results, POC, initial HEP Person educated: Patient Education method: Explanation, Demonstration, Verbal cues, and Handouts Education comprehension: verbalized understanding, returned demonstration, and needs further education  HOME EXERCISE PROGRAM: Access Code: [email or web address] URL: [email or web address] Date: 11/08/2023 Prepared by: [facility] - Outpatient  Rehab - [facility]  Exercises - Standing Gastroc Stretch at Counter  - 1-2 x daily - 7 x weekly - 1 sets - 3 reps - 30 sec hold - Seated Hamstring stretch  - 1-2 x daily - 7 x weekly - 1 sets - 3 reps - 30 sec hold  GOALS: Goals reviewed with patient? Yes  SHORT TERM GOALS: Target date:  11/24/2023  Pt will be independent with HEP for improved balance, strength, gait. Baseline: Goal status: NOT MET 10/14/025  2.  Pt will improve 5x sit<>stand to less than or equal to 20 sec to demonstrate improved functional strength and transfer efficiency. Baseline: 26.46 sec>21.34 sec 11/28/2023 Goal status: PARTIALLY MET, 11/28/2023  LONG TERM  GOALS: Target date: 12/22/2023  Pt will be independent with progression of HEP for improved strength, balance, gait  . Baseline:  Goal status: IN PROGRESS  2.  Pt will improve 5x sit<>stand to less than or equal to 15 sec to demonstrate improved functional strength and transfer efficiency. Baseline:  Goal status: IN PROGRESS  3.  Pt will improve TUG score to less than or equal to 18 sec for decreased fall risk. Baseline: 25.44 sec Goal status: IN PROGRESS  4.  Pt will improve Berg score to at least 45/56 to decrease fall risk. Baseline: 32/56 Goal status: IN PROGRESS  5.  Pt will improve gait velocity to at least 2.3 ft/sec  for improved gait efficiency and safety. Baseline: 1.98 ft/sec Goal status: IN PROGRESS  6.  Pt will verbalize understanding of fall prevention in home environment. Baseline:  Goal status: IN PROGRESS  ASSESSMENT:  CLINICAL IMPRESSION: Pt reports increased LBP from performing yard work over weekend. Initiated with NU-step and moist heat to lumbar to improve activity tolerance and reduce back pain. Neuro re-ed activities to facilitate greater volitional control left ankle dorsiflexion w/ visual feedback followed by multisensory balance to facilitate ankle strategy/compensatory movements to overcome lack of ankle DF followed by dynamic balance activities to enhance foot clearance and single limb support to improve safety with negotiation of obstacles gradually reducing use of UE support and mirror for visual monitoring to reduce reliance on looking down to visualize feet performed to good tolerance and safety  awareness.  Reports reduced back pain end of session 1-2/10.  OBJECTIVE IMPAIRMENTS: Abnormal gait, decreased balance, decreased mobility, difficulty walking, decreased ROM, decreased strength, impaired flexibility, and postural dysfunction.   ACTIVITY LIMITATIONS: standing, transfers, and locomotion level  PARTICIPATION LIMITATIONS: community activity and yard work  PERSONAL FACTORS: 3+ comorbidities: see above are also affecting patient's functional outcome.   REHAB POTENTIAL: Good  CLINICAL DECISION MAKING: Evolving/moderate complexity  EVALUATION COMPLEXITY: Moderate  PLAN:  PT FREQUENCY: 2x/week  PT DURATION: 6 weeks plus eval  PLANNED INTERVENTIONS: 97750- Physical Performance Testing, 97110-Therapeutic exercises, 97530- Therapeutic activity, [id number]- Neuromuscular re-education, 97535- Self Care, [id number]- Manual therapy, [phone]- Gait training, [phone]- Orthotic Initial, [phone]- Orthotic/Prosthetic subsequent, Patient/Family education, and Balance training  PLAN FOR NEXT SESSION: Continue strength and balance; balance on compliant surface;  trial AFO, f/u on AFO order from Dr. Joane? Theresa messaged him 11/20/23 via staff message)    5:02 PM, 12/04/23 M. Kelly Elick Aguilera, PT, DPT Physical Therapist- [place] Office Number: [phone]

## 2023-12-05 ENCOUNTER — Ambulatory Visit: Admitting: Podiatry

## 2023-12-05 NOTE — Progress Notes (Unsigned)
 Assessment/Plan:  1.  Parkinsonism.  I suspect that this does represent idiopathic Parkinson's disease.  The patient has tremor, bradykinesia, rigidity and mild postural instability.  -We discussed the diagnosis as well as pathophysiology of the disease.  We discussed treatment options as well as prognostic indicators.  Patient education was provided.  -We decided to add carbidopa/levodopa 25/100.  1/2 tab tid x 1 wk, then 1/2 in am & noon & 1 at night for a week, then 1/2 in am &1 at noon &night for a week, then 1 po at 10 AM/2 PM/6 PM.  Risks, benefits, side effects and alternative therapies were discussed.  The opportunity to ask questions was given and they were answered to the best of my ability.  The patient expressed understanding and willingness to follow the outlined treatment protocols.  - Patient is already in physical therapy.  I did send his physical therapist of note about this new diagnosis.  -We discussed community resources in the area including patient support groups and community exercise programs for PD and pt education was provided to the patient.   2.  Foot drop secondary to lumbar radiculopathy  - Patient following with Evan Corey.  AFO was prescribed at his last visit.   Subjective:   William Norman was seen today in the movement disorders clinic for neurologic consultation at the request of Joane Artist RAMAN, MD.  The consultation is for the evaluation of tremor, gait instability and to rule out Parkinson's.  Patient does have known left foot drop due to lumbar spinal stenosis which contributes to his gait instability, but Dr. Joane noted left upper extremityrey rest tremor as well as left facial tremor and felt he needed further evaluation, for which the patient was sent here.  He also has been attending physical therapy with Greig Anon and those notes have been reviewed.  Specific Symptoms:  Tremor: Yes.  , LUE rest tremor x 6 months - 1 year Family hx of  similar:  No. Voice: higher pitched Sleep:   Vivid Dreams:  No.  Acting out dreams:  No. Wet Pillows: Yes.   Postural symptoms:  Yes.    Falls?  No. Bradykinesia symptoms: difficulty getting out of a chair; no shuffle but has to pick up the L foot and flop it due to foot drop x 6months to 1 year Loss of smell:  No. Loss of taste:  No. Urinary Incontinence:  has urgency and leakage associated with it; no need to wear depends Difficulty Swallowing:  No. Handwriting, micrographia: Yes.   Trouble with ADL's:  No.  Trouble buttoning clothing: No. Depression:  No. Memory changes:  No. Hallucinations:  No.  visual distortions: No. N/V:  No. Lightheaded:  No.  Syncope: No. Diplopia:  No. Prior exposure to reglan /antipsychotics: No.  Neuroimaging of the brain has previously been performed.  MRI was done in 2023 with atrophy and WMD   ALLERGIES:   Allergies  Allergen Reactions   Nitrous Oxide Other (See Comments)    Pt reports he passed out and almost died after inhaling gas through mask at dentist office    Simvastatin Other (See Comments)    Muscle aches     CURRENT MEDICATIONS:  Current Meds  Medication Sig   acetaminophen  (TYLENOL ) 500 MG tablet Take 1,500 mg by mouth every morning.   AMBULATORY NON FORMULARY MEDICATION AFO- left Dispense 1 Dx code: F78.627 Use as needed   apixaban  (ELIQUIS ) 5 MG TABS tablet Take 1 tablet (  5 mg total) by mouth 2 (two) times daily.   atenolol  (TENORMIN ) 25 MG tablet TAKE 1 TABLET (25 MG TOTAL) BY MOUTH DAILY.   atorvastatin  (LIPITOR) 40 MG tablet TAKE 1/2 TABLET BY MOUTH DAILY   Coenzyme Q10 (CO Q 10 PO) Take 1 capsule by mouth in the morning.   Cyanocobalamin  (VITAMIN B-12) 1000 MCG SUBL Place 1 tablet (1,000 mcg total) under the tongue daily.   finasteride  (PROSCAR ) 5 MG tablet Take 1 tablet (5 mg total) by mouth daily.   furosemide  (LASIX ) 20 MG tablet TAKE 1 TABLET BY MOUTH EVERY DAY   hydroxypropyl methylcellulose /  hypromellose (ISOPTO TEARS / GONIOVISC) 2.5 % ophthalmic solution Place 1 drop into both eyes in the morning.   losartan  (COZAAR ) 25 MG tablet TAKE 1 TABLET (25 MG TOTAL) BY MOUTH DAILY.   nitroGLYCERIN  (NITROSTAT ) 0.4 MG SL tablet Dissolve 1 tablet under the tongue every 5 minutes as needed   tamsulosin  (FLOMAX ) 0.4 MG CAPS capsule Take 2 capsules (0.8 mg total) by mouth daily.   triamcinolone  (NASACORT ) 55 MCG/ACT AERO nasal inhaler Place 1 spray into the nose daily. Start with 1 spray each side twice a day for 3 days, then reduce to daily until sinus symptoms are better.     Objective:   VITALS:   Vitals:   12/07/23 1322  BP: 124/76  Pulse: 74  SpO2: 98%  Weight: 214 lb 12.8 oz (97.4 kg)  Height: 6' (1.829 m)    GEN:  The patient appears stated age and is in NAD. HEENT:  Normocephalic, atraumatic.  The mucous membranes are moist. The superficial temporal arteries are without ropiness or tenderness. CV:  irreg irreg Lungs:  CTAB Neck/HEME:  There are no carotid bruits bilaterally.  Neurological examination:  Orientation: The patient is alert and oriented x3.  Cranial nerves: There is good facial symmetry. Extraocular muscles are intact. The visual fields are full to confrontational testing. The speech is fluent and clear. Soft palate rises symmetrically and there is no tongue deviation. Hearing is intact to conversational tone. Sensation: Sensation is intact to light and pinprick throughout (facial, trunk, extremities). Vibration is decreased distally. There is no extinction with double simultaneous stimulation. There is no sensory dermatomal level identified. Motor: Strength is 5/5 in the bilateral upper and lower extremities with the exception of 0/5 at ankle dorsiflexion on the left.   Shoulder shrug is equal and symmetric.  There is no pronator drift. Deep tendon reflexes: Deep tendon reflexes are 0-1/4 at the bilateral biceps, triceps, brachioradialis, patella and achilles.  Plantar responses are downgoing bilaterally.  Movement examination: Tone: There is mild increased tone in the LUE Abnormal movements: there is mild to mod LUE rest tremor; there is some occasional head tremor in the no direction Coordination:  There is mild decremation with RAM's, with finger taps and  alternation of supination/pronation of the forearm on the L.  All other RAMs are normal bilaterally with the exception of toe taps on the L (because he cannot due to that due to foot drop) Gait and Station: The patient pushes off to arise. The patient's stride length is good but he has a marching gait on the L due to foot drop.  No shuffling.   I have reviewed and interpreted the following labs independently   Chemistry      Component Value Date/Time   NA 139 05/23/2023 1330   NA 141 11/17/2020 1437   K 4.1 05/23/2023 1330   CL 104 05/23/2023  1330   CO2 27 05/23/2023 1330   BUN 17 05/23/2023 1330   BUN 16 11/17/2020 1437   CREATININE 0.73 05/23/2023 1330   CREATININE 0.87 10/02/2020 1626      Component Value Date/Time   CALCIUM  9.4 05/23/2023 1330   ALKPHOS 54 05/23/2023 1330   AST 24 05/23/2023 1330   ALT 16 05/23/2023 1330   BILITOT 1.2 05/23/2023 1330   BILITOT 0.9 12/03/2019 1509      Lab Results  Component Value Date   TSH 2.40 05/23/2023   Lab Results  Component Value Date   WBC 7.4 05/23/2023   HGB 14.6 05/23/2023   HCT 44.4 05/23/2023   MCV 91.2 05/23/2023   PLT 197.0 05/23/2023     Total time spent on today's visit was 60 minutes, including both face-to-face time and nonface-to-face time.  Time included that spent on review of records (prior notes available to me/labs/imaging if pertinent), discussing treatment and goals, answering patient's questions and coordinating care.  Cc:  Kennyth Worth HERO, MD

## 2023-12-06 ENCOUNTER — Ambulatory Visit: Admitting: Podiatry

## 2023-12-06 ENCOUNTER — Encounter: Payer: Self-pay | Admitting: Podiatry

## 2023-12-06 VITALS — Ht 72.0 in | Wt 212.0 lb

## 2023-12-06 DIAGNOSIS — M79675 Pain in left toe(s): Secondary | ICD-10-CM | POA: Diagnosis not present

## 2023-12-06 DIAGNOSIS — B351 Tinea unguium: Secondary | ICD-10-CM | POA: Diagnosis not present

## 2023-12-06 DIAGNOSIS — M79674 Pain in right toe(s): Secondary | ICD-10-CM | POA: Diagnosis not present

## 2023-12-06 NOTE — Progress Notes (Signed)
   Chief Complaint  Patient presents with   Nail Problem    Pt is here for RFC.    SUBJECTIVE Patient presents to office today complaining of elongated, thickened nails that cause pain while ambulating in shoes.  Patient is unable to trim their own nails.  Patient states that he was just into the office on 05/03/2022 with another physician and he was unsatisfied with the nail debridement.  Presenting today to have them reevaluated.   Past Medical History:  Diagnosis Date   BPH (benign prostatic hypertrophy)    BPH associated with nocturia 08/29/2013   BPH with obstruction/lower urinary tract symptoms 08/14/2019   CAD (coronary artery disease)    CAD (coronary artery disease), native coronary artery    PTCA of RCA 1986, PTCA of LAD 1992,  Negative treadmill Cardiolite 2011    Chronic dermatitis of hands 10/12/2015   Chronic venous insufficiency    Coagulation disorder 10/26/2018   Diverticulitis    DVT (deep venous thrombosis) (HCC)    Dyslipidemia    Dysrhythmia    Eczema    Erectile dysfunction 07/11/2012   Essential hypertension    GERD 08/04/2006       GERD (gastroesophageal reflux disease)    Hyperglycemia 07/08/2019   Hyperlipidemia    Hypertension    Incomplete emptying of bladder 08/14/2019   Long term current use of anticoagulant therapy    Lumbar disc disease    Overweight (BMI 25.0-29.9) 06/18/2015   Persistent atrial fibrillation (HCC) 04/10/2018   Personal history of DVT (deep vein thrombosis)    Initially in 1998 with recurrence in 2002 now on chronic warfarin    Soft tissue lesion of foot 08/27/2015   Stroke (HCC)    Urinary tract infection with hematuria 08/14/2019    Allergies  Allergen Reactions   Nitrous Oxide Other (See Comments)    Pt reports he passed out and almost died after inhaling gas through mask at dentist office    Simvastatin Other (See Comments)    Muscle aches      OBJECTIVE General Patient is awake, alert, and oriented x 3 and  in no acute distress. Derm Skin is dry and supple bilateral. Negative open lesions or macerations. Remaining integument unremarkable. Nails are tender, long, thickened and dystrophic with subungual debris, consistent with onychomycosis, 1-5 bilateral. No signs of infection noted. Vasc  DP and PT pedal pulses palpable bilaterally. Temperature gradient within normal limits.  Neuro Epicritic and protective threshold sensation grossly intact bilaterally.  Musculoskeletal Exam No symptomatic pedal deformities noted bilateral. Muscular strength within normal limits.  ASSESSMENT 1.  Pain due to onychomycosis of toenails both  PLAN OF CARE 1. Patient evaluated today.  2. Instructed to maintain good pedal hygiene and foot care.  3. Mechanical debridement of nails 1-5 bilaterally performed using a nail nipper. Filed with dremel without incident.  4. Return to clinic in 3 mos. W/ Dr. Loreda or Gaynel Thresa EMERSON Janit, DPM [facility]  Dr. Thresa EMERSON Janit, DPM    [address] [place], [place] [address]                Office [phone]  Fax [phone]

## 2023-12-07 ENCOUNTER — Encounter: Payer: Self-pay | Admitting: Neurology

## 2023-12-07 ENCOUNTER — Ambulatory Visit: Admitting: Neurology

## 2023-12-07 VITALS — BP 124/76 | HR 74 | Ht 72.0 in | Wt 214.8 lb

## 2023-12-07 DIAGNOSIS — G20A1 Parkinson's disease without dyskinesia, without mention of fluctuations: Secondary | ICD-10-CM | POA: Diagnosis not present

## 2023-12-07 DIAGNOSIS — M5416 Radiculopathy, lumbar region: Secondary | ICD-10-CM

## 2023-12-07 MED ORDER — CARBIDOPA-LEVODOPA 25-100 MG PO TABS: 1.0000 | ORAL_TABLET | Freq: Three times a day (TID) | ORAL | 1 refills | Status: AC

## 2023-12-07 NOTE — Patient Instructions (Signed)
 Start Carbidopa Levodopa as follows: Take 1/2 tablet three times daily, at least 30 minutes before meals (approximately 10am/2pm/6pm), for one week Then take 1/2 tablet in the morning, 1/2 tablet in the afternoon at 2pm, 1 tablet in the evening at 6pm, at least 30 minutes before meals, for one week Then take 1/2 tablet in the morning, 1 tablet in the afternoon, 1 tablet in the evening, at least 30 minutes before meals, for one week Then take 1 tablet three times daily at 10am/2pm/6pm, at least 30 minutes before meals   As a reminder, carbidopa/levodopa can be taken at the same time as a carbohydrate, but we like to have you take your pill either 30 minutes before a protein source or 1 hour after as protein can interfere with carbidopa/levodopa absorption.   The physicians and staff at [facility] are committed to providing excellent care. You may receive a survey requesting feedback about your experience at our office. We strive to receive very good responses to the survey questions. If you feel that your experience would prevent you from giving the office a very good  response, please contact our office to try to remedy the situation. We may be reached at [phone]. Thank you for taking the time out of your busy day to complete the survey.

## 2023-12-08 ENCOUNTER — Ambulatory Visit

## 2023-12-08 DIAGNOSIS — R2681 Unsteadiness on feet: Secondary | ICD-10-CM | POA: Diagnosis not present

## 2023-12-08 DIAGNOSIS — M6281 Muscle weakness (generalized): Secondary | ICD-10-CM | POA: Diagnosis not present

## 2023-12-08 DIAGNOSIS — R2689 Other abnormalities of gait and mobility: Secondary | ICD-10-CM

## 2023-12-08 NOTE — Therapy (Signed)
 OUTPATIENT PHYSICAL THERAPY NEURO TREATMENT   Patient Name: William Norman MRN: [id number] DOB:January 11, 1941, 83 y.o., male Today's Date: 12/08/2023   PCP: Kennyth Worth HERO, MD  REFERRING PROVIDER: Joane Artist RAMAN, MD   END OF SESSION:  PT End of Session - 12/08/23 1113     Visit Number 8    Number of Visits 13    Date for Recertification  12/22/23    Authorization Type [employer]    Progress Note Due on Visit 10    PT Start Time 1110    PT Stop Time 1155    PT Time Calculation (min) 45 min    Equipment Utilized During Treatment Gait belt    Activity Tolerance Patient tolerated treatment well    Behavior During Therapy WFL for tasks assessed/performed              Past Medical History:  Diagnosis Date   BPH (benign prostatic hypertrophy)    BPH associated with nocturia 08/29/2013   BPH with obstruction/lower urinary tract symptoms 08/14/2019   CAD (coronary artery disease)    CAD (coronary artery disease), native coronary artery    PTCA of RCA 1986, PTCA of LAD 1992,  Negative treadmill Cardiolite 2011    Chronic dermatitis of hands 10/12/2015   Chronic venous insufficiency    Coagulation disorder 10/26/2018   Diverticulitis    DVT (deep venous thrombosis) (HCC)    Dyslipidemia    Dysrhythmia    Eczema    Erectile dysfunction 07/11/2012   Essential hypertension    GERD 08/04/2006       GERD (gastroesophageal reflux disease)    Hyperglycemia 07/08/2019   Hyperlipidemia    Hypertension    Incomplete emptying of bladder 08/14/2019   Long term current use of anticoagulant therapy    Lumbar disc disease    Overweight (BMI 25.0-29.9) 06/18/2015   Persistent atrial fibrillation (HCC) 04/10/2018   Personal history of DVT (deep vein thrombosis)    Initially in 1998 with recurrence in 2002 now on chronic warfarin    Soft tissue lesion of foot 08/27/2015   Stroke [facility])    Urinary tract infection with hematuria 08/14/2019   Past Surgical History:   Procedure Laterality Date   APPLICATION OF WOUND VAC  09/02/2020   Procedure: APPLICATION OF WOUND VAC;  Surgeon: Belinda Cough, MD;  Location: [facility] OR;  Service: General;;   CARDIAC CATHETERIZATION     CARPAL TUNNEL RELEASE  04/21/2011   Procedure: CARPAL TUNNEL RELEASE;  Surgeon: Franky JONELLE Curia, MD;  Location: [facility];  Service: Orthopedics;  Laterality: Left;   CARPAL TUNNEL RELEASE  05/23/2011   Procedure: CARPAL TUNNEL RELEASE;  Surgeon: Franky JONELLE Curia, MD;  Location: [facility];  Service: Orthopedics;  Laterality: Right;   COLON SURGERY  2010 and 2011 colostomy reversal   CORONARY ANGIOPLASTY     EYE SURGERY     repair of macular hole   IR RADIOLOGIST EVAL & MGMT  01/27/2023   IR RADIOLOGIST EVAL & MGMT  10/04/2023   LAPAROTOMY N/A 09/02/2020   Procedure: EXPLORATORY LAPAROTOMY, LYSIS OF ADHESIONS ,BOWEL RESECTION;  Surgeon: Belinda Cough, MD;  Location: [facility] OR;  Service: General;  Laterality: N/A;   LUMBAR LAMINECTOMY/DECOMPRESSION MICRODISCECTOMY Left 03/18/2021   Procedure: LAMINOTOMY, FORAMINOTOMY, LEFT LUMBAR THREE-FOUR;  Surgeon: Lanis Pupa, MD;  Location: [facility] OR;  Service: Neurosurgery;  Laterality: Left;   NASAL SEPTUM SURGERY     precutaneous transluminal coronary angioplasty  right hernia     TONSILLECTOMY AND ADENOIDECTOMY     Patient Active Problem List   Diagnosis Date Noted   Parkinson's disease without dyskinesia or fluctuating manifestations (HCC) 12/07/2023   Left foot drop 10/26/2023   Age-related cognitive decline 04/17/2023   Bilateral pseudophakia 04/17/2023   Chronic diastolic (congestive) heart failure (HCC) 04/17/2023   Chronic low back pain 04/17/2023   Disorder of prostate, unspecified 04/17/2023   Dry eye syndrome of bilateral lacrimal glands 04/17/2023   History of small bowel obstruction 04/17/2023   Intervertebral disc disorders with radiculopathy, lumbosacral region 04/17/2023   Localized swelling, mass and lump,  left lower limb 04/17/2023   Neoplasm of uncertain behavior of skin 04/17/2023   Puckering of macula, left eye 04/17/2023   Retention of urine, unspecified 04/17/2023   Tremor 01/26/2023   UTI (urinary tract infection) 11/01/2022   SIRS (systemic inflammatory response syndrome) (HCC) 10/31/2022   Permanent atrial fibrillation (HCC) 03/29/2022   Secondary hypercoagulable state 03/29/2022   History of CVA (cerebrovascular accident) 03/29/2022   Depression, major, single episode, mild 08/24/2021   Osteoarthritis 11/20/2020   Constipation 10/02/2020   Protein-calorie malnutrition, severe 09/03/2020   SBO (small bowel obstruction) (HCC) 08/28/2020   AAA (abdominal aortic aneurysm) 03/06/2020   History of ischemic stroke 02/16/2020   DDD (degenerative disc disease), lumbosacral 02/11/2020   Recurrent UTI 08/14/2019   Hyperglycemia 07/08/2019   Persistent atrial fibrillation (HCC) 04/10/2018   Chronic dermatitis of hands 10/12/2015   Stroke (HCC) 06/18/2015   BPH associated with nocturia 08/29/2013   CAD (coronary artery disease), native coronary artery    Long term current use of anticoagulant    Personal history of DVT (deep vein thrombosis)    Chronic venous insufficiency    Erectile dysfunction 07/11/2012   Lumbar radiculopathy    Dyslipidemia    Essential hypertension     ONSET DATE: 10/25/2023 (MD referral)  REFERRING DIAG:  M21.372 (ICD-10-CM) - Left foot drop  Z74.09 (ICD-10-CM) - Impaired functional mobility, balance, gait, and endurance  R25.1 (ICD-10-CM) - Tremor    THERAPY DIAG:  Unsteadiness on feet  Muscle weakness (generalized)  Other abnormalities of gait and mobility  Rationale for Evaluation and Treatment: Rehabilitation  SUBJECTIVE:                                                                                                                                                                                             SUBJECTIVE STATEMENT: Back is  feeling better. Had a visit with Dr. Evonnie and diagnosed w/ PD.  Took my first dose of carbidopa-levadopa  Pt accompanied by: self  PERTINENT HISTORY: Chronic left foot drop due to lumbar spinal stenosis. Does not have an AFO, LUE and L face tremor  PAIN:  Are you having pain? Yes: NPRS scale: 7/10 Pain location:  right low back  Pain description: aggravated Aggravating factors: Unsure Relieving factors: tylenol   PRECAUTIONS: Fall  RED FLAGS: None   WEIGHT BEARING RESTRICTIONS: No  FALLS: Has patient fallen in last 6 months? Yes. Number of falls 1  LIVING ENVIRONMENT: Lives with: lives with their family and lives with their spouse Lives in: House/apartment Stairs: 1 step to enter; steps to basement with rails Has following equipment at home: Single point cane  PLOF: Independent and Leisure: enjoys yardwork  PATIENT GOALS: Agreeable to working on balance  OBJECTIVE:   TODAY'S TREATMENT: 12/08/23 Activity Comments  NU-step level 4 x 8 min Maintaining 70+ SPM  Standing ankle DF 2x10 BUE support on rails, feet on slope (toes down), and mirror for feedback  Standing PF 2x10 BUE support  Static multisenosry balance To facilitaet postural control and righting reactions  Pt education Regarding exercise methods for those with PD        TODAY'S TREATMENT: 12/04/23 Activity Comments  NU-step level 5 x 8 min MHP to low back to reduce pain and improve activity tolerance.   Standing ankle DF 3x10 BUE support on rails, feet on slope (toes down), and mirror for feedback  Standing balance -feet apart EO/EC on foam 2x30 sec. Weight shift lateral, ant-post x 60 sec -alt toe taps x 45 sec: BUE support, single UE, no UE -forwards/backwards hurdles: BUE, single UE, no UE support  Gastroc stretch 3x60 sec On slantboard to reduce contracture/incr DF ROM for gait mechanics  Back pain 1-2/10 end of session         TODAY'S TREATMENT: 11/28/2023 Activity Comments  NuStep, Level 5, x 8  minutes, 4 extremities  SPM >70-80 for dynamic warm up  5x sit to stand:  21.34 sec without UE support Compared to 26.46 sec with definite UEs  HEP review Cues for technique; pt reports he isn't doing them regularly  Step ups to 6 step, 10 reps each leg leading *Standing activities performed wearing clinic L AFO  Forward/back walking along the counter 4 reps   Side stepping along counter, 3 reps   Forward/back stepping, 10 reps   Gait with SPC, with clinic AFO trial on LLE, 85 ft x 4 reps Improved gait pattern with decreased steppage pattern and increased step length bilat.      PATIENT EDUCATION: Education details: Progress towards goals, POC, review of HEP and reviewed benefits of consistently performing   Person educated: Patient Education method: Explanation, Demonstration, Tactile cues, Verbal cues, and Handouts Education comprehension: verbalized understanding and returned demonstration     HOME EXERCISE PROGRAM Last updated: 11/13/23 Access Code: [email or web address] URL: [email or web address] Date: 11/13/2023 Prepared by: [facility] - Outpatient  Rehab - [facility]  Program Notes perform standing exercises in a corner or near handrail for safety  Exercises - Standing Gastroc Stretch at Counter  - 1-2 x daily - 7 x weekly - 1 sets - 3 reps - 30 sec hold - Seated Hamstring stretch  - 1-2 x daily - 7 x weekly - 1 sets - 3 reps - 30 sec hold - Standing Balance in Corner with Eyes Closed  - 1 x daily - 5 x weekly - 2-3 sets - 30 sec  hold - Standing Near Stance in Corner  - 1 x daily - 5  x weekly - 2-3 sets - 30 sec  hold - Standing Toe Taps  - 1 x daily - 5 x weekly - 2 sets - 10 reps    Note: Objective measures were completed at Evaluation unless otherwise noted.  DIAGNOSTIC FINDINGS: NA for this episode  COGNITION: Overall cognitive status: reports some memory issues   SENSATION: Light touch: Impaired on LLE and distally on LLE  COORDINATION: Unable to  tap LLE; equal (both slightly slowed heel to shin)  POSTURE: rounded shoulders, forward head, and resting tremor LUE and face  LOWER EXTREMITY ROM:     Active  Right Eval Left Eval  Hip flexion    Hip extension    Hip abduction    Hip adduction    Hip internal rotation    Hip external rotation    Knee flexion -20 -20  Knee extension    Ankle dorsiflexion +5 A/ROM A/ROM neutral P/ROM + 5  Ankle plantarflexion    Ankle inversion    Ankle eversion     (Blank rows = not tested)  LOWER EXTREMITY MMT:    MMT Right Eval Left Eval  Hip flexion 4 3+  Hip extension    Hip abduction    Hip adduction    Hip internal rotation    Hip external rotation    Knee flexion 4 3+  Knee extension 4 4  Ankle dorsiflexion 3+ 1  Ankle plantarflexion 3+ 3  Ankle inversion    Ankle eversion    (Blank rows = not tested)  TRANSFERS: Sit to stand: Modified independence  Assistive device utilized: BUE support     Stand to sit: Modified independence  Assistive device utilized: BUE support      GAIT: Findings: Gait Characteristics: step through pattern, decreased ankle dorsiflexion- Left, Left steppage, and poor foot clearance- Left, Distance walked: 50 ft, Assistive device utilized:cane, and Level of assistance: SBA  FUNCTIONAL TESTS:  5 times sit to stand: 26.46 sec with BUE support Timed up and go (TUG): 25.44 sec no cane, unsteadiness with turns 10 meter walk test: 16.6 sec = 1.98 ft/sec Berg Balance test:32/56 (Scores <45/56 indicate increased fall risk)                                                                                                                                TREATMENT DATE: 11/08/2023    PATIENT EDUCATION: Education details: Eval results, POC, initial HEP Person educated: Patient Education method: Explanation, Demonstration, Verbal cues, and Handouts Education comprehension: verbalized understanding, returned demonstration, and needs further education  HOME  EXERCISE PROGRAM: Access Code: [email or web address] URL: [email or web address] Date: 11/08/2023 Prepared by: [facility] - Outpatient  Rehab - [facility]  Exercises - Standing Gastroc Stretch at Counter  - 1-2 x daily - 7 x weekly - 1 sets - 3 reps - 30 sec hold - Seated Hamstring stretch  - 1-2 x daily - 7 x weekly - 1 sets -  3 reps - 30 sec hold  GOALS: Goals reviewed with patient? Yes  SHORT TERM GOALS: Target date: 11/24/2023  Pt will be independent with HEP for improved balance, strength, gait. Baseline: Goal status: NOT MET 10/14/025  2.  Pt will improve 5x sit<>stand to less than or equal to 20 sec to demonstrate improved functional strength and transfer efficiency. Baseline: 26.46 sec>21.34 sec 11/28/2023 Goal status: PARTIALLY MET, 11/28/2023  LONG TERM GOALS: Target date: 12/22/2023  Pt will be independent with progression of HEP for improved strength, balance, gait  . Baseline:  Goal status: IN PROGRESS  2.  Pt will improve 5x sit<>stand to less than or equal to 15 sec to demonstrate improved functional strength and transfer efficiency. Baseline:  Goal status: IN PROGRESS  3.  Pt will improve TUG score to less than or equal to 18 sec for decreased fall risk. Baseline: 25.44 sec Goal status: IN PROGRESS  4.  Pt will improve Berg score to at least 45/56 to decrease fall risk. Baseline: 32/56 Goal status: IN PROGRESS  5.  Pt will improve gait velocity to at least 2.3 ft/sec  for improved gait efficiency and safety. Baseline: 1.98 ft/sec Goal status: IN PROGRESS  6.  Pt will verbalize understanding of fall prevention in home environment. Baseline:  Goal status: IN PROGRESS  ASSESSMENT:  CLINICAL IMPRESSION: Continued with training for improved ankle DF control and facilitation with marginal movement to left ankle DF and notably limited AROM on right ankle as well. Balance activities to promote proprioception and postural control under multisensory  conditions with considerable difficulty with eyes closed conditions requiring tactile cues for correction.  Pt reports recent PD dx and has questions regarding mode of exercise in addressing this.  Provided with education and summary of best practices and relevant intensity metrics to guide exercise intervention for those w/ PD and encouraged to f/u with questions and guidance for this topic  OBJECTIVE IMPAIRMENTS: Abnormal gait, decreased balance, decreased mobility, difficulty walking, decreased ROM, decreased strength, impaired flexibility, and postural dysfunction.   ACTIVITY LIMITATIONS: standing, transfers, and locomotion level  PARTICIPATION LIMITATIONS: community activity and yard work  PERSONAL FACTORS: 3+ comorbidities: see above are also affecting patient's functional outcome.   REHAB POTENTIAL: Good  CLINICAL DECISION MAKING: Evolving/moderate complexity  EVALUATION COMPLEXITY: Moderate  PLAN:  PT FREQUENCY: 2x/week  PT DURATION: 6 weeks plus eval  PLANNED INTERVENTIONS: 97750- Physical Performance Testing, 97110-Therapeutic exercises, 97530- Therapeutic activity, [id number]- Neuromuscular re-education, 97535- Self Care, 02859- Manual therapy, [phone]- Gait training, [phone]- Orthotic Initial, [phone]- Orthotic/Prosthetic subsequent, Patient/Family education, and Balance training  PLAN FOR NEXT SESSION: Continue strength and balance; balance on compliant surface;  trial AFO, f/u on AFO order from Dr. Joane? Theresa messaged him 11/20/23 via staff message)    11:15 AM, 12/08/23 M. Kelly Jenner Rosier, PT, DPT Physical Therapist- [place] Office Number: [phone]

## 2023-12-08 NOTE — Telephone Encounter (Signed)
 Procedure date for bridge

## 2023-12-12 ENCOUNTER — Ambulatory Visit

## 2023-12-12 DIAGNOSIS — M6281 Muscle weakness (generalized): Secondary | ICD-10-CM

## 2023-12-12 DIAGNOSIS — R2689 Other abnormalities of gait and mobility: Secondary | ICD-10-CM | POA: Diagnosis not present

## 2023-12-12 DIAGNOSIS — R2681 Unsteadiness on feet: Secondary | ICD-10-CM | POA: Diagnosis not present

## 2023-12-12 NOTE — Therapy (Signed)
 OUTPATIENT PHYSICAL THERAPY NEURO TREATMENT   Patient Name: William Norman MRN: [id number] DOB:August 29, 1940, 83 y.o., male Today's Date: 12/12/2023   PCP: Kennyth Worth HERO, MD  REFERRING PROVIDER: Joane Artist RAMAN, MD   END OF SESSION:  PT End of Session - 12/12/23 1457     Visit Number 9    Number of Visits 13    Date for Recertification  12/22/23    Authorization Type [employer]    Progress Note Due on Visit 10    PT Start Time 1445    PT Stop Time 1530    PT Time Calculation (min) 45 min    Equipment Utilized During Treatment Gait belt    Activity Tolerance Patient tolerated treatment well    Behavior During Therapy [facility] for tasks assessed/performed              Past Medical History:  Diagnosis Date   BPH (benign prostatic hypertrophy)    BPH associated with nocturia 08/29/2013   BPH with obstruction/lower urinary tract symptoms 08/14/2019   CAD (coronary artery disease)    CAD (coronary artery disease), native coronary artery    PTCA of RCA 1986, PTCA of LAD 1992,  Negative treadmill Cardiolite 2011    Chronic dermatitis of hands 10/12/2015   Chronic venous insufficiency    Coagulation disorder 10/26/2018   Diverticulitis    DVT (deep venous thrombosis) (HCC)    Dyslipidemia    Dysrhythmia    Eczema    Erectile dysfunction 07/11/2012   Essential hypertension    GERD 08/04/2006       GERD (gastroesophageal reflux disease)    Hyperglycemia 07/08/2019   Hyperlipidemia    Hypertension    Incomplete emptying of bladder 08/14/2019   Long term current use of anticoagulant therapy    Lumbar disc disease    Overweight (BMI 25.0-29.9) 06/18/2015   Persistent atrial fibrillation (HCC) 04/10/2018   Personal history of DVT (deep vein thrombosis)    Initially in 1998 with recurrence in 2002 now on chronic warfarin    Soft tissue lesion of foot 08/27/2015   Stroke [facility])    Urinary tract infection with hematuria 08/14/2019   Past Surgical History:   Procedure Laterality Date   APPLICATION OF WOUND VAC  09/02/2020   Procedure: APPLICATION OF WOUND VAC;  Surgeon: Belinda Cough, MD;  Location: [facility] OR;  Service: General;;   CARDIAC CATHETERIZATION     CARPAL TUNNEL RELEASE  04/21/2011   Procedure: CARPAL TUNNEL RELEASE;  Surgeon: Franky JONELLE Curia, MD;  Location: [facility];  Service: Orthopedics;  Laterality: Left;   CARPAL TUNNEL RELEASE  05/23/2011   Procedure: CARPAL TUNNEL RELEASE;  Surgeon: Franky JONELLE Curia, MD;  Location: [facility];  Service: Orthopedics;  Laterality: Right;   COLON SURGERY  2010 and 2011 colostomy reversal   CORONARY ANGIOPLASTY     EYE SURGERY     repair of macular hole   IR RADIOLOGIST EVAL & MGMT  01/27/2023   IR RADIOLOGIST EVAL & MGMT  10/04/2023   LAPAROTOMY N/A 09/02/2020   Procedure: EXPLORATORY LAPAROTOMY, LYSIS OF ADHESIONS ,BOWEL RESECTION;  Surgeon: Belinda Cough, MD;  Location: [facility] OR;  Service: General;  Laterality: N/A;   LUMBAR LAMINECTOMY/DECOMPRESSION MICRODISCECTOMY Left 03/18/2021   Procedure: LAMINOTOMY, FORAMINOTOMY, LEFT LUMBAR THREE-FOUR;  Surgeon: Lanis Pupa, MD;  Location: [facility] OR;  Service: Neurosurgery;  Laterality: Left;   NASAL SEPTUM SURGERY     precutaneous transluminal coronary angioplasty  right hernia     TONSILLECTOMY AND ADENOIDECTOMY     Patient Active Problem List   Diagnosis Date Noted   Parkinson's disease without dyskinesia or fluctuating manifestations (HCC) 12/07/2023   Left foot drop 10/26/2023   Age-related cognitive decline 04/17/2023   Bilateral pseudophakia 04/17/2023   Chronic diastolic (congestive) heart failure (HCC) 04/17/2023   Chronic low back pain 04/17/2023   Disorder of prostate, unspecified 04/17/2023   Dry eye syndrome of bilateral lacrimal glands 04/17/2023   History of small bowel obstruction 04/17/2023   Intervertebral disc disorders with radiculopathy, lumbosacral region 04/17/2023   Localized swelling, mass and lump,  left lower limb 04/17/2023   Neoplasm of uncertain behavior of skin 04/17/2023   Puckering of macula, left eye 04/17/2023   Retention of urine, unspecified 04/17/2023   Tremor 01/26/2023   UTI (urinary tract infection) 11/01/2022   SIRS (systemic inflammatory response syndrome) (HCC) 10/31/2022   Permanent atrial fibrillation (HCC) 03/29/2022   Secondary hypercoagulable state 03/29/2022   History of CVA (cerebrovascular accident) 03/29/2022   Depression, major, single episode, mild 08/24/2021   Osteoarthritis 11/20/2020   Constipation 10/02/2020   Protein-calorie malnutrition, severe 09/03/2020   SBO (small bowel obstruction) (HCC) 08/28/2020   AAA (abdominal aortic aneurysm) 03/06/2020   History of ischemic stroke 02/16/2020   DDD (degenerative disc disease), lumbosacral 02/11/2020   Recurrent UTI 08/14/2019   Hyperglycemia 07/08/2019   Persistent atrial fibrillation (HCC) 04/10/2018   Chronic dermatitis of hands 10/12/2015   Stroke (HCC) 06/18/2015   BPH associated with nocturia 08/29/2013   CAD (coronary artery disease), native coronary artery    Long term current use of anticoagulant    Personal history of DVT (deep vein thrombosis)    Chronic venous insufficiency    Erectile dysfunction 07/11/2012   Lumbar radiculopathy    Dyslipidemia    Essential hypertension     ONSET DATE: 10/25/2023 (MD referral)  REFERRING DIAG:  M21.372 (ICD-10-CM) - Left foot drop  Z74.09 (ICD-10-CM) - Impaired functional mobility, balance, gait, and endurance  R25.1 (ICD-10-CM) - Tremor    THERAPY DIAG:  Unsteadiness on feet  Muscle weakness (generalized)  Other abnormalities of gait and mobility  Rationale for Evaluation and Treatment: Rehabilitation  SUBJECTIVE:                                                                                                                                                                                             SUBJECTIVE STATEMENT: Figured out  I'm still a member at the [employer] and started back for exercise  Pt accompanied by: self  PERTINENT HISTORY: Chronic left foot drop  due to lumbar spinal stenosis. Does not have an AFO, LUE and L face tremor  PAIN:  Are you having pain? Yes: NPRS scale: 7/10 Pain location:  right low back  Pain description: aggravated Aggravating factors: Unsure Relieving factors: tylenol   PRECAUTIONS: Fall  RED FLAGS: None   WEIGHT BEARING RESTRICTIONS: No  FALLS: Has patient fallen in last 6 months? Yes. Number of falls 1  LIVING ENVIRONMENT: Lives with: lives with their family and lives with their spouse Lives in: House/apartment Stairs: 1 step to enter; steps to basement with rails Has following equipment at home: Single point cane  PLOF: Independent and Leisure: enjoys yardwork  PATIENT GOALS: Agreeable to working on balance  OBJECTIVE:   TODAY'S TREATMENT: 12/12/23 Activity Comments  Standing DF 3x10 BUE support  Forwards tandem/backwards walk x 2 min // bars light UE support  Sidestep x 2 min minimal UE support   Static multisensory balance On firm/foam Weight shift Head movements  Alt stair taps x 2 min 5#, 8 box for improved hip flexor endurance/strength for foot clearance  Hip abd 2x10 5#  Standing hamstring curls 2x10 5#  Gastroc stretch 3x60 sec On slantboard      PATIENT EDUCATION: Education details: Progress towards goals, POC, review of HEP and reviewed benefits of consistently performing   Person educated: Patient Education method: Explanation, Demonstration, Tactile cues, Verbal cues, and Handouts Education comprehension: verbalized understanding and returned demonstration     HOME EXERCISE PROGRAM Last updated: 11/13/23 Access Code: [email or web address] URL: [email or web address] Date: 11/13/2023 Prepared by: [facility] - Outpatient  Rehab - [facility]  Program Notes perform standing exercises in a corner or near handrail for  safety  Exercises - Standing Gastroc Stretch at Counter  - 1-2 x daily - 7 x weekly - 1 sets - 3 reps - 30 sec hold - Seated Hamstring stretch  - 1-2 x daily - 7 x weekly - 1 sets - 3 reps - 30 sec hold - Standing Balance in Corner with Eyes Closed  - 1 x daily - 5 x weekly - 2-3 sets - 30 sec  hold - Standing Near Stance in Corner  - 1 x daily - 5 x weekly - 2-3 sets - 30 sec  hold - Standing Toe Taps  - 1 x daily - 5 x weekly - 2 sets - 10 reps    Note: Objective measures were completed at Evaluation unless otherwise noted.  DIAGNOSTIC FINDINGS: NA for this episode  COGNITION: Overall cognitive status: reports some memory issues   SENSATION: Light touch: Impaired on LLE and distally on LLE  COORDINATION: Unable to tap LLE; equal (both slightly slowed heel to shin)  POSTURE: rounded shoulders, forward head, and resting tremor LUE and face  LOWER EXTREMITY ROM:     Active  Right Eval Left Eval  Hip flexion    Hip extension    Hip abduction    Hip adduction    Hip internal rotation    Hip external rotation    Knee flexion -20 -20  Knee extension    Ankle dorsiflexion +5 A/ROM A/ROM neutral P/ROM + 5  Ankle plantarflexion    Ankle inversion    Ankle eversion     (Blank rows = not tested)  LOWER EXTREMITY MMT:    MMT Right Eval Left Eval  Hip flexion 4 3+  Hip extension    Hip abduction    Hip adduction    Hip internal rotation    Hip external  rotation    Knee flexion 4 3+  Knee extension 4 4  Ankle dorsiflexion 3+ 1  Ankle plantarflexion 3+ 3  Ankle inversion    Ankle eversion    (Blank rows = not tested)  TRANSFERS: Sit to stand: Modified independence  Assistive device utilized: BUE support     Stand to sit: Modified independence  Assistive device utilized: BUE support      GAIT: Findings: Gait Characteristics: step through pattern, decreased ankle dorsiflexion- Left, Left steppage, and poor foot clearance- Left, Distance walked: 50 ft, Assistive  device utilized:cane, and Level of assistance: SBA  FUNCTIONAL TESTS:  5 times sit to stand: 26.46 sec with BUE support Timed up and go (TUG): 25.44 sec no cane, unsteadiness with turns 10 meter walk test: 16.6 sec = 1.98 ft/sec Berg Balance test:32/56 (Scores <45/56 indicate increased fall risk)                                                                                                                                TREATMENT DATE: 11/08/2023    PATIENT EDUCATION: Education details: Eval results, POC, initial HEP Person educated: Patient Education method: Explanation, Demonstration, Verbal cues, and Handouts Education comprehension: verbalized understanding, returned demonstration, and needs further education  HOME EXERCISE PROGRAM: Access Code: [email or web address] URL: [email or web address] Date: 11/08/2023 Prepared by: [facility] - Outpatient  Rehab - [facility]  Exercises - Standing Gastroc Stretch at Counter  - 1-2 x daily - 7 x weekly - 1 sets - 3 reps - 30 sec hold - Seated Hamstring stretch  - 1-2 x daily - 7 x weekly - 1 sets - 3 reps - 30 sec hold  GOALS: Goals reviewed with patient? Yes  SHORT TERM GOALS: Target date: 11/24/2023  Pt will be independent with HEP for improved balance, strength, gait. Baseline: Goal status: NOT MET 10/14/025  2.  Pt will improve 5x sit<>stand to less than or equal to 20 sec to demonstrate improved functional strength and transfer efficiency. Baseline: 26.46 sec>21.34 sec 11/28/2023 Goal status: PARTIALLY MET, 11/28/2023  LONG TERM GOALS: Target date: 12/22/2023  Pt will be independent with progression of HEP for improved strength, balance, gait  . Baseline:  Goal status: IN PROGRESS  2.  Pt will improve 5x sit<>stand to less than or equal to 15 sec to demonstrate improved functional strength and transfer efficiency. Baseline:  Goal status: IN PROGRESS  3.  Pt will improve TUG score to less than or equal to 18 sec  for decreased fall risk. Baseline: 25.44 sec Goal status: IN PROGRESS  4.  Pt will improve Berg score to at least 45/56 to decrease fall risk. Baseline: 32/56 Goal status: IN PROGRESS  5.  Pt will improve gait velocity to at least 2.3 ft/sec  for improved gait efficiency and safety. Baseline: 1.98 ft/sec Goal status: IN PROGRESS  6.  Pt will verbalize understanding of fall prevention in home environment. Baseline:  Goal  status: IN PROGRESS  ASSESSMENT:  CLINICAL IMPRESSION: Continued with training for improved ankle DF control and facilitation with marginal movement to left ankle DF and notably limited AROM on right ankle as well. Balance activities to promote proprioception and postural control under multisensory conditions with considerable difficulty with eyes closed conditions requiring tactile cues for correction.  LE PRE to improve strength/recruitment for improve endurance for ambulation w/ altered gait mechanics with considerable difficulty with left hip flexion (march) w/ resistance.  Gastroc stretching to improve ankle DF for plantigrade foot posture  OBJECTIVE IMPAIRMENTS: Abnormal gait, decreased balance, decreased mobility, difficulty walking, decreased ROM, decreased strength, impaired flexibility, and postural dysfunction.   ACTIVITY LIMITATIONS: standing, transfers, and locomotion level  PARTICIPATION LIMITATIONS: community activity and yard work  PERSONAL FACTORS: 3+ comorbidities: see above are also affecting patient's functional outcome.   REHAB POTENTIAL: Good  CLINICAL DECISION MAKING: Evolving/moderate complexity  EVALUATION COMPLEXITY: Moderate  PLAN:  PT FREQUENCY: 2x/week  PT DURATION: 6 weeks plus eval  PLANNED INTERVENTIONS: 97750- Physical Performance Testing, 97110-Therapeutic exercises, 97530- Therapeutic activity, [id number]- Neuromuscular re-education, 97535- Self Care, [id number]- Manual therapy, [phone]- Gait training, [phone]- Orthotic Initial, [phone]-  Orthotic/Prosthetic subsequent, Patient/Family education, and Balance training  PLAN FOR NEXT SESSION: Continue strength and balance; balance on compliant surface;  trial AFO, f/u on AFO order from Dr. Joane? Theresa messaged him 11/20/23 via staff message)    2:58 PM, 12/12/23 M. Kelly Etoy Mcdonnell, PT, DPT Physical Therapist- [place] Office Number: [phone]

## 2023-12-14 ENCOUNTER — Ambulatory Visit: Admitting: Physical Therapy

## 2023-12-18 ENCOUNTER — Ambulatory Visit: Attending: Family Medicine

## 2023-12-18 DIAGNOSIS — M6281 Muscle weakness (generalized): Secondary | ICD-10-CM | POA: Diagnosis not present

## 2023-12-18 DIAGNOSIS — R2689 Other abnormalities of gait and mobility: Secondary | ICD-10-CM | POA: Diagnosis not present

## 2023-12-18 DIAGNOSIS — R2681 Unsteadiness on feet: Secondary | ICD-10-CM | POA: Diagnosis not present

## 2023-12-18 NOTE — Therapy (Signed)
 OUTPATIENT PHYSICAL THERAPY NEURO TREATMENT and Progress Note   Patient Name: William Norman MRN: [id number] DOB:03-21-1940, 83 y.o., male Today's Date: 12/18/2023   PCP: Kennyth Worth HERO, MD  REFERRING PROVIDER: Joane Artist RAMAN, MD   Progress Note Reporting Period 11/08/23 to 12/18/23  See note below for Objective Data and Assessment of Progress/Goals.      END OF SESSION:  PT End of Session - 12/18/23 1447     Visit Number 10    Number of Visits 13    Date for Recertification  12/22/23    Authorization Type [employer]    Progress Note Due on Visit 10    PT Start Time 1445    PT Stop Time 1530    PT Time Calculation (min) 45 min    Equipment Utilized During Treatment Gait belt    Activity Tolerance Patient tolerated treatment well    Behavior During Therapy WFL for tasks assessed/performed              Past Medical History:  Diagnosis Date   BPH (benign prostatic hypertrophy)    BPH associated with nocturia 08/29/2013   BPH with obstruction/lower urinary tract symptoms 08/14/2019   CAD (coronary artery disease)    CAD (coronary artery disease), native coronary artery    PTCA of RCA 1986, PTCA of LAD 1992,  Negative treadmill Cardiolite 2011    Chronic dermatitis of hands 10/12/2015   Chronic venous insufficiency    Coagulation disorder 10/26/2018   Diverticulitis    DVT (deep venous thrombosis) (HCC)    Dyslipidemia    Dysrhythmia    Eczema    Erectile dysfunction 07/11/2012   Essential hypertension    GERD 08/04/2006       GERD (gastroesophageal reflux disease)    Hyperglycemia 07/08/2019   Hyperlipidemia    Hypertension    Incomplete emptying of bladder 08/14/2019   Long term current use of anticoagulant therapy    Lumbar disc disease    Overweight (BMI 25.0-29.9) 06/18/2015   Persistent atrial fibrillation (HCC) 04/10/2018   Personal history of DVT (deep vein thrombosis)    Initially in 1998 with recurrence in 2002 now on chronic  warfarin    Soft tissue lesion of foot 08/27/2015   Stroke [facility])    Urinary tract infection with hematuria 08/14/2019   Past Surgical History:  Procedure Laterality Date   APPLICATION OF WOUND VAC  09/02/2020   Procedure: APPLICATION OF WOUND VAC;  Surgeon: Belinda Cough, MD;  Location: [facility] OR;  Service: General;;   CARDIAC CATHETERIZATION     CARPAL TUNNEL RELEASE  04/21/2011   Procedure: CARPAL TUNNEL RELEASE;  Surgeon: Franky JONELLE Curia, MD;  Location: [facility];  Service: Orthopedics;  Laterality: Left;   CARPAL TUNNEL RELEASE  05/23/2011   Procedure: CARPAL TUNNEL RELEASE;  Surgeon: Franky JONELLE Curia, MD;  Location: [facility];  Service: Orthopedics;  Laterality: Right;   COLON SURGERY  2010 and 2011 colostomy reversal   CORONARY ANGIOPLASTY     EYE SURGERY     repair of macular hole   IR RADIOLOGIST EVAL & MGMT  01/27/2023   IR RADIOLOGIST EVAL & MGMT  10/04/2023   LAPAROTOMY N/A 09/02/2020   Procedure: EXPLORATORY LAPAROTOMY, LYSIS OF ADHESIONS ,BOWEL RESECTION;  Surgeon: Belinda Cough, MD;  Location: [facility] OR;  Service: General;  Laterality: N/A;   LUMBAR LAMINECTOMY/DECOMPRESSION MICRODISCECTOMY Left 03/18/2021   Procedure: LAMINOTOMY, FORAMINOTOMY, LEFT LUMBAR THREE-FOUR;  Surgeon: Lanis Pupa, MD;  Location: [facility] OR;  Service: Neurosurgery;  Laterality: Left;   NASAL SEPTUM SURGERY     precutaneous transluminal coronary angioplasty     right hernia     TONSILLECTOMY AND ADENOIDECTOMY     Patient Active Problem List   Diagnosis Date Noted   Parkinson's disease without dyskinesia or fluctuating manifestations (HCC) 12/07/2023   Left foot drop 10/26/2023   Age-related cognitive decline 04/17/2023   Bilateral pseudophakia 04/17/2023   Chronic diastolic (congestive) heart failure (HCC) 04/17/2023   Chronic low back pain 04/17/2023   Disorder of prostate, unspecified 04/17/2023   Dry eye syndrome of bilateral lacrimal glands 04/17/2023   History of small  bowel obstruction 04/17/2023   Intervertebral disc disorders with radiculopathy, lumbosacral region 04/17/2023   Localized swelling, mass and lump, left lower limb 04/17/2023   Neoplasm of uncertain behavior of skin 04/17/2023   Puckering of macula, left eye 04/17/2023   Retention of urine, unspecified 04/17/2023   Tremor 01/26/2023   UTI (urinary tract infection) 11/01/2022   SIRS (systemic inflammatory response syndrome) (HCC) 10/31/2022   Permanent atrial fibrillation (HCC) 03/29/2022   Secondary hypercoagulable state 03/29/2022   History of CVA (cerebrovascular accident) 03/29/2022   Depression, major, single episode, mild 08/24/2021   Osteoarthritis 11/20/2020   Constipation 10/02/2020   Protein-calorie malnutrition, severe 09/03/2020   SBO (small bowel obstruction) (HCC) 08/28/2020   AAA (abdominal aortic aneurysm) 03/06/2020   History of ischemic stroke 02/16/2020   DDD (degenerative disc disease), lumbosacral 02/11/2020   Recurrent UTI 08/14/2019   Hyperglycemia 07/08/2019   Persistent atrial fibrillation (HCC) 04/10/2018   Chronic dermatitis of hands 10/12/2015   Stroke (HCC) 06/18/2015   BPH associated with nocturia 08/29/2013   CAD (coronary artery disease), native coronary artery    Long term current use of anticoagulant    Personal history of DVT (deep vein thrombosis)    Chronic venous insufficiency    Erectile dysfunction 07/11/2012   Lumbar radiculopathy    Dyslipidemia    Essential hypertension     ONSET DATE: 10/25/2023 (MD referral)  REFERRING DIAG:  M21.372 (ICD-10-CM) - Left foot drop  Z74.09 (ICD-10-CM) - Impaired functional mobility, balance, gait, and endurance  R25.1 (ICD-10-CM) - Tremor    THERAPY DIAG:  Unsteadiness on feet  Muscle weakness (generalized)  Other abnormalities of gait and mobility  Rationale for Evaluation and Treatment: Rehabilitation  SUBJECTIVE:                                                                                                                                                                                              SUBJECTIVE STATEMENT: Reports  consistent exercise at [facility]  Pt accompanied by: self  PERTINENT HISTORY: Chronic left foot drop due to lumbar spinal stenosis. Does not have an AFO, LUE and L face tremor  PAIN:  Are you having pain? Yes: NPRS scale: 2-3/10 Pain location:  right low back  Pain description: aggravated Aggravating factors: Unsure Relieving factors: tylenol   PRECAUTIONS: Fall  RED FLAGS: None   WEIGHT BEARING RESTRICTIONS: No  FALLS: Has patient fallen in last 6 months? Yes. Number of falls 1  LIVING ENVIRONMENT: Lives with: lives with their family and lives with their spouse Lives in: House/apartment Stairs: 1 step to enter; steps to basement with rails Has following equipment at home: Single point cane  PLOF: Independent and Leisure: enjoys yardwork  PATIENT GOALS: Agreeable to working on balance  OBJECTIVE:   TODAY'S TREATMENT: 12/18/23 Activity Comments  5xSTS 20 sec  TUG test 16 sec w/out AD 15 sec w/ cane  Berg Balance 44/56  10 meter walk = 16.22 sec w/ cane 2.0 ft/sec  Static multisensory balance   Gastroc stretch 2x2 min On slantboard   Pt education: regarding clinic's Parkinson's screening service for PT/OT/ST Verbalized understanding and would like to be included for this service     TODAY'S TREATMENT: 12/12/23 Activity Comments  Standing DF 3x10 BUE support  Forwards tandem/backwards walk x 2 min // bars light UE support  Sidestep x 2 min minimal UE support   Static multisensory balance On firm/foam Weight shift Head movements  Alt stair taps x 2 min 5#, 8 box for improved hip flexor endurance/strength for foot clearance  Hip abd 2x10 5#  Standing hamstring curls 2x10 5#  Gastroc stretch 3x60 sec On slantboard     [facility] PT Assessment - 12/18/23 0001       Berg Balance Test   Sit to Stand Able to stand without using hands  and stabilize independently    Standing Unsupported Able to stand safely 2 minutes    Sitting with Back Unsupported but Feet Supported on Floor or Stool Able to sit safely and securely 2 minutes    Stand to Sit Sits safely with minimal use of hands    Transfers Able to transfer safely, minor use of hands    Standing Unsupported with Eyes Closed Able to stand 10 seconds safely    Standing Unsupported with Feet Together Able to place feet together independently and stand 1 minute safely    From Standing, Reach Forward with Outstretched Arm Can reach forward >12 cm safely (5)    From Standing Position, Pick up Object from Floor Able to pick up shoe safely and easily    From Standing Position, Turn to Look Behind Over each Shoulder Turn sideways only but maintains balance    Turn 360 Degrees Able to turn 360 degrees safely but slowly    Standing Unsupported, Alternately Place Feet on Step/Stool Able to complete 4 steps without aid or supervision    Standing Unsupported, One Foot in Front Able to take small step independently and hold 30 seconds    Standing on One Leg Tries to lift leg/unable to hold 3 seconds but remains standing independently    Total Score 44           PATIENT EDUCATION: Education details: Progress towards goals, POC, review of HEP and reviewed benefits of consistently performing   Person educated: Patient Education method: Explanation, Demonstration, Tactile cues, Verbal cues, and Handouts Education comprehension: verbalized understanding and returned demonstration  HOME EXERCISE PROGRAM Last updated: 11/13/23 Access Code: [email or web address] URL: [email or web address] Date: 11/13/2023 Prepared by: [facility] - Outpatient  Rehab - [facility]  Program Notes perform standing exercises in a corner or near handrail for safety  Exercises - Standing Gastroc Stretch at Counter  - 1-2 x daily - 7 x weekly - 1 sets - 3 reps - 30 sec hold - Seated Hamstring  stretch  - 1-2 x daily - 7 x weekly - 1 sets - 3 reps - 30 sec hold - Standing Balance in Corner with Eyes Closed  - 1 x daily - 5 x weekly - 2-3 sets - 30 sec  hold - Standing Near Stance in Corner  - 1 x daily - 5 x weekly - 2-3 sets - 30 sec  hold - Standing Toe Taps  - 1 x daily - 5 x weekly - 2 sets - 10 reps    Note: Objective measures were completed at Evaluation unless otherwise noted.  DIAGNOSTIC FINDINGS: NA for this episode  COGNITION: Overall cognitive status: reports some memory issues   SENSATION: Light touch: Impaired on LLE and distally on LLE  COORDINATION: Unable to tap LLE; equal (both slightly slowed heel to shin)  POSTURE: rounded shoulders, forward head, and resting tremor LUE and face  LOWER EXTREMITY ROM:     Active  Right Eval Left Eval  Hip flexion    Hip extension    Hip abduction    Hip adduction    Hip internal rotation    Hip external rotation    Knee flexion -20 -20  Knee extension    Ankle dorsiflexion +5 A/ROM A/ROM neutral P/ROM + 5  Ankle plantarflexion    Ankle inversion    Ankle eversion     (Blank rows = not tested)  LOWER EXTREMITY MMT:    MMT Right Eval Left Eval  Hip flexion 4 3+  Hip extension    Hip abduction    Hip adduction    Hip internal rotation    Hip external rotation    Knee flexion 4 3+  Knee extension 4 4  Ankle dorsiflexion 3+ 1  Ankle plantarflexion 3+ 3  Ankle inversion    Ankle eversion    (Blank rows = not tested)  TRANSFERS: Sit to stand: Modified independence  Assistive device utilized: BUE support     Stand to sit: Modified independence  Assistive device utilized: BUE support      GAIT: Findings: Gait Characteristics: step through pattern, decreased ankle dorsiflexion- Left, Left steppage, and poor foot clearance- Left, Distance walked: 50 ft, Assistive device utilized:cane, and Level of assistance: SBA  FUNCTIONAL TESTS:  5 times sit to stand: 26.46 sec with BUE support Timed up and go  (TUG): 25.44 sec no cane, unsteadiness with turns 10 meter walk test: 16.6 sec = 1.98 ft/sec Berg Balance test:32/56 (Scores <45/56 indicate increased fall risk)  TREATMENT DATE: 11/08/2023    PATIENT EDUCATION: Education details: Eval results, POC, initial HEP Person educated: Patient Education method: Explanation, Demonstration, Verbal cues, and Handouts Education comprehension: verbalized understanding, returned demonstration, and needs further education  HOME EXERCISE PROGRAM: Access Code: [email or web address] URL: [email or web address] Date: 11/08/2023 Prepared by: [facility] - Outpatient  Rehab - [facility]  Exercises - Standing Gastroc Stretch at Counter  - 1-2 x daily - 7 x weekly - 1 sets - 3 reps - 30 sec hold - Seated Hamstring stretch  - 1-2 x daily - 7 x weekly - 1 sets - 3 reps - 30 sec hold  GOALS: Goals reviewed with patient? Yes  SHORT TERM GOALS: Target date: 11/24/2023  Pt will be independent with HEP for improved balance, strength, gait. Baseline: Goal status: NOT MET 10/14/025  2.  Pt will improve 5x sit<>stand to less than or equal to 20 sec to demonstrate improved functional strength and transfer efficiency. Baseline: 26.46 sec>21.34 sec;  Goal status: NOT MET  LONG TERM GOALS: Target date: 12/22/2023  Pt will be independent with progression of HEP for improved strength, balance, gait  . Baseline:  Goal status: IN PROGRESS  2.  Pt will improve 5x sit<>stand to less than or equal to 15 sec to demonstrate improved functional strength and transfer efficiency. Baseline: 20 sec Goal status: IN PROGRESS 12/18/23  3.  Pt will improve TUG score to less than or equal to 18 sec for decreased fall risk. Baseline: 25.44 sec; 16 sec Goal status: MET  4.  Pt will improve Berg score to at least 45/56 to decrease fall  risk. Baseline: 32/56; 44/56 Goal status: IN PROGRESS 12/18/23  5.  Pt will improve gait velocity to at least 2.3 ft/sec  for improved gait efficiency and safety. Baseline: 1.98 ft/sec; 2.0 ft/sec Goal status: IN PROGRESS 11/3  6.  Pt will verbalize understanding of fall prevention in home environment. Baseline:  Goal status: IN PROGRESS  ASSESSMENT:  CLINICAL IMPRESSION: Progress note details complete with improved performance 5xSTS from baseline. TUG test LTG met and improved Berg Balance Test score from baseline 32 to 44/56 but still indicating high risk for falls.  Pt has not yet been able to obtain left AFO for his foot drop but is awaiting call-back from clinic.  Pt reports consistent exercise routine at gym in past 2 weeks where he is prioritizing more vigorous cardiovascular exercise and progressing strength training.  Discussed tenets of our clinic's Parkinson's screening service and amenable to this service.  OBJECTIVE IMPAIRMENTS: Abnormal gait, decreased balance, decreased mobility, difficulty walking, decreased ROM, decreased strength, impaired flexibility, and postural dysfunction.   ACTIVITY LIMITATIONS: standing, transfers, and locomotion level  PARTICIPATION LIMITATIONS: community activity and yard work  PERSONAL FACTORS: 3+ comorbidities: see above are also affecting patient's functional outcome.   REHAB POTENTIAL: Good  CLINICAL DECISION MAKING: Evolving/moderate complexity  EVALUATION COMPLEXITY: Moderate  PLAN:  PT FREQUENCY: 2x/week  PT DURATION: 6 weeks plus eval  PLANNED INTERVENTIONS: 97750- Physical Performance Testing, 97110-Therapeutic exercises, 97530- Therapeutic activity, [id number]- Neuromuscular re-education, 97535- Self Care, [id number]- Manual therapy, [id number]- Gait training, [phone]- Orthotic Initial, [phone]- Orthotic/Prosthetic subsequent, Patient/Family education, and Balance training  PLAN FOR NEXT SESSION: Anticipate D/C to HEP/gym routine  Set-up for  Parkinson's screen in 6 months  TODAY'S TREATMENT: 12/18/23 Activity Comments  5xSTS 20 sec  TUG test  16 sec w/out AD 15 sec w/ cane  Berg Balance 44/56  10 meter walk = 16.22 sec w/ cane 2.0  ft/sec  -----no AFO present at this time           2:48 PM, 12/18/23 M. Kelly Hyun Marsalis, PT, DPT Physical Therapist- [place] Office Number: [phone]

## 2023-12-19 ENCOUNTER — Ambulatory Visit: Admitting: Neurology

## 2023-12-20 ENCOUNTER — Ambulatory Visit

## 2023-12-20 DIAGNOSIS — M6281 Muscle weakness (generalized): Secondary | ICD-10-CM | POA: Diagnosis not present

## 2023-12-20 DIAGNOSIS — R2689 Other abnormalities of gait and mobility: Secondary | ICD-10-CM

## 2023-12-20 DIAGNOSIS — R2681 Unsteadiness on feet: Secondary | ICD-10-CM | POA: Diagnosis not present

## 2023-12-20 NOTE — Therapy (Signed)
 OUTPATIENT PHYSICAL THERAPY NEURO TREATMENT and D/C Summary  Patient Name: William Norman MRN: [id number] DOB:09/03/1940, 83 y.o., male Today's Date: 12/20/2023   PCP: Kennyth Worth HERO, MD  REFERRING PROVIDER: Joane Artist RAMAN, MD    PHYSICAL THERAPY DISCHARGE SUMMARY  Visits from Start of Care: 11  Current functional level related to goals / functional outcomes: Progressed with POC details, did not meet outcome measure goals, but significant improvements since start of care   Remaining deficits: Left foot drop, has not yet received AFO. High risk for falls   Education / Equipment: HEP and gym-based exercise recommendations   Patient agrees to discharge. Patient goals were partially met. Patient is being discharged due to being pleased with the current functional level.   END OF SESSION:  PT End of Session - 12/20/23 1451     Visit Number 11    Number of Visits 13    Date for Recertification  12/22/23    Authorization Type [employer]    Progress Note Due on Visit 20    PT Start Time 1445    PT Stop Time 1530    PT Time Calculation (min) 45 min    Equipment Utilized During Treatment Gait belt    Activity Tolerance Patient tolerated treatment well    Behavior During Therapy WFL for tasks assessed/performed              Past Medical History:  Diagnosis Date   BPH (benign prostatic hypertrophy)    BPH associated with nocturia 08/29/2013   BPH with obstruction/lower urinary tract symptoms 08/14/2019   CAD (coronary artery disease)    CAD (coronary artery disease), native coronary artery    PTCA of RCA 1986, PTCA of LAD 1992,  Negative treadmill Cardiolite 2011    Chronic dermatitis of hands 10/12/2015   Chronic venous insufficiency    Coagulation disorder 10/26/2018   Diverticulitis    DVT (deep venous thrombosis) (HCC)    Dyslipidemia    Dysrhythmia    Eczema    Erectile dysfunction 07/11/2012   Essential hypertension    GERD 08/04/2006        GERD (gastroesophageal reflux disease)    Hyperglycemia 07/08/2019   Hyperlipidemia    Hypertension    Incomplete emptying of bladder 08/14/2019   Long term current use of anticoagulant therapy    Lumbar disc disease    Overweight (BMI 25.0-29.9) 06/18/2015   Persistent atrial fibrillation (HCC) 04/10/2018   Personal history of DVT (deep vein thrombosis)    Initially in 1998 with recurrence in 2002 now on chronic warfarin    Soft tissue lesion of foot 08/27/2015   Stroke [facility])    Urinary tract infection with hematuria 08/14/2019   Past Surgical History:  Procedure Laterality Date   APPLICATION OF WOUND VAC  09/02/2020   Procedure: APPLICATION OF WOUND VAC;  Surgeon: Belinda Cough, MD;  Location: [facility] OR;  Service: General;;   CARDIAC CATHETERIZATION     CARPAL TUNNEL RELEASE  04/21/2011   Procedure: CARPAL TUNNEL RELEASE;  Surgeon: Franky JONELLE Curia, MD;  Location: [facility];  Service: Orthopedics;  Laterality: Left;   CARPAL TUNNEL RELEASE  05/23/2011   Procedure: CARPAL TUNNEL RELEASE;  Surgeon: Franky JONELLE Curia, MD;  Location: [facility];  Service: Orthopedics;  Laterality: Right;   COLON SURGERY  2010 and 2011 colostomy reversal   CORONARY ANGIOPLASTY     EYE SURGERY     repair of macular hole  IR RADIOLOGIST EVAL & MGMT  01/27/2023   IR RADIOLOGIST EVAL & MGMT  10/04/2023   LAPAROTOMY N/A 09/02/2020   Procedure: EXPLORATORY LAPAROTOMY, LYSIS OF ADHESIONS ,BOWEL RESECTION;  Surgeon: Belinda Cough, MD;  Location: [facility] OR;  Service: General;  Laterality: N/A;   LUMBAR LAMINECTOMY/DECOMPRESSION MICRODISCECTOMY Left 03/18/2021   Procedure: LAMINOTOMY, FORAMINOTOMY, LEFT LUMBAR THREE-FOUR;  Surgeon: Lanis Pupa, MD;  Location: [facility] OR;  Service: Neurosurgery;  Laterality: Left;   NASAL SEPTUM SURGERY     precutaneous transluminal coronary angioplasty     right hernia     TONSILLECTOMY AND ADENOIDECTOMY     Patient Active Problem List   Diagnosis Date Noted    Parkinson's disease without dyskinesia or fluctuating manifestations (HCC) 12/07/2023   Left foot drop 10/26/2023   Age-related cognitive decline 04/17/2023   Bilateral pseudophakia 04/17/2023   Chronic diastolic (congestive) heart failure (HCC) 04/17/2023   Chronic low back pain 04/17/2023   Disorder of prostate, unspecified 04/17/2023   Dry eye syndrome of bilateral lacrimal glands 04/17/2023   History of small bowel obstruction 04/17/2023   Intervertebral disc disorders with radiculopathy, lumbosacral region 04/17/2023   Localized swelling, mass and lump, left lower limb 04/17/2023   Neoplasm of uncertain behavior of skin 04/17/2023   Puckering of macula, left eye 04/17/2023   Retention of urine, unspecified 04/17/2023   Tremor 01/26/2023   UTI (urinary tract infection) 11/01/2022   SIRS (systemic inflammatory response syndrome) (HCC) 10/31/2022   Permanent atrial fibrillation (HCC) 03/29/2022   Secondary hypercoagulable state 03/29/2022   History of CVA (cerebrovascular accident) 03/29/2022   Depression, major, single episode, mild 08/24/2021   Osteoarthritis 11/20/2020   Constipation 10/02/2020   Protein-calorie malnutrition, severe 09/03/2020   SBO (small bowel obstruction) (HCC) 08/28/2020   AAA (abdominal aortic aneurysm) 03/06/2020   History of ischemic stroke 02/16/2020   DDD (degenerative disc disease), lumbosacral 02/11/2020   Recurrent UTI 08/14/2019   Hyperglycemia 07/08/2019   Persistent atrial fibrillation (HCC) 04/10/2018   Chronic dermatitis of hands 10/12/2015   Stroke (HCC) 06/18/2015   BPH associated with nocturia 08/29/2013   CAD (coronary artery disease), native coronary artery    Long term current use of anticoagulant    Personal history of DVT (deep vein thrombosis)    Chronic venous insufficiency    Erectile dysfunction 07/11/2012   Lumbar radiculopathy    Dyslipidemia    Essential hypertension     ONSET DATE: 10/25/2023 (MD referral)  REFERRING  DIAG:  M21.372 (ICD-10-CM) - Left foot drop  Z74.09 (ICD-10-CM) - Impaired functional mobility, balance, gait, and endurance  R25.1 (ICD-10-CM) - Tremor    THERAPY DIAG:  Unsteadiness on feet  Muscle weakness (generalized)  Other abnormalities of gait and mobility  Rationale for Evaluation and Treatment: Rehabilitation  SUBJECTIVE:  SUBJECTIVE STATEMENT: Reports consistent exercise at [facility]  Pt accompanied by: self  PERTINENT HISTORY: Chronic left foot drop due to lumbar spinal stenosis. Does not have an AFO, LUE and L face tremor  PAIN:  Are you having pain? Yes: NPRS scale: 2-3/10 Pain location:  right low back  Pain description: aggravated Aggravating factors: Unsure Relieving factors: tylenol   PRECAUTIONS: Fall  RED FLAGS: None   WEIGHT BEARING RESTRICTIONS: No  FALLS: Has patient fallen in last 6 months? Yes. Number of falls 1  LIVING ENVIRONMENT: Lives with: lives with their family and lives with their spouse Lives in: House/apartment Stairs: 1 step to enter; steps to basement with rails Has following equipment at home: Single point cane  PLOF: Independent and Leisure: enjoys yardwork  PATIENT GOALS: Agreeable to working on balance  OBJECTIVE:   TODAY'S TREATMENT: 12/20/23 Activity Comments  Dynamic balance activities x 5 min   Static balance activities x 5 min   HEP review   POC performance and review   Gait training trials w/ AFO Instruction in placement, position, use of device, and techqniues for self-assessment to ensure no skin breakdown/irritation.         TODAY'S TREATMENT: 12/18/23 Activity Comments  5xSTS 20 sec  TUG test 16 sec w/out AD 15 sec w/ cane  Berg Balance 44/56  10 meter walk = 16.22 sec w/ cane 2.0 ft/sec  Static multisensory balance    Gastroc stretch 2x2 min On slantboard   Pt education: regarding clinic's Parkinson's screening service for PT/OT/ST Verbalized understanding and would like to be included for this service          PATIENT EDUCATION: Education details: Education/discussion regarding PD screen at tis clinic in 6 months Person educated: Patient Education method: Explanation, Demonstration, Tactile cues, Verbal cues, and Handouts Education comprehension: verbalized understanding and returned demonstration     HOME EXERCISE PROGRAM Last updated: 11/13/23 Access Code: [email or web address] URL: [email or web address] Date: 11/13/2023 Prepared by: [facility] - Outpatient  Rehab - [facility]  Program Notes perform standing exercises in a corner or near handrail for safety  Exercises - Standing Gastroc Stretch at Counter  - 1-2 x daily - 7 x weekly - 1 sets - 3 reps - 30 sec hold - Seated Hamstring stretch  - 1-2 x daily - 7 x weekly - 1 sets - 3 reps - 30 sec hold - Standing Balance in Corner with Eyes Closed  - 1 x daily - 5 x weekly - 2-3 sets - 30 sec  hold - Standing Near Stance in Corner  - 1 x daily - 5 x weekly - 2-3 sets - 30 sec  hold - Standing Toe Taps  - 1 x daily - 5 x weekly - 2 sets - 10 reps    Note: Objective measures were completed at Evaluation unless otherwise noted.  DIAGNOSTIC FINDINGS: NA for this episode  COGNITION: Overall cognitive status: reports some memory issues   SENSATION: Light touch: Impaired on LLE and distally on LLE  COORDINATION: Unable to tap LLE; equal (both slightly slowed heel to shin)  POSTURE: rounded shoulders, forward head, and resting tremor LUE and face  LOWER EXTREMITY ROM:     Active  Right Eval Left Eval  Hip flexion    Hip extension    Hip abduction    Hip adduction    Hip internal rotation    Hip external rotation    Knee flexion -20 -20  Knee extension    Ankle dorsiflexion +  5 A/ROM A/ROM neutral P/ROM + 5  Ankle  plantarflexion    Ankle inversion    Ankle eversion     (Blank rows = not tested)  LOWER EXTREMITY MMT:    MMT Right Eval Left Eval  Hip flexion 4 3+  Hip extension    Hip abduction    Hip adduction    Hip internal rotation    Hip external rotation    Knee flexion 4 3+  Knee extension 4 4  Ankle dorsiflexion 3+ 1  Ankle plantarflexion 3+ 3  Ankle inversion    Ankle eversion    (Blank rows = not tested)  TRANSFERS: Sit to stand: Modified independence  Assistive device utilized: BUE support     Stand to sit: Modified independence  Assistive device utilized: BUE support      GAIT: Findings: Gait Characteristics: step through pattern, decreased ankle dorsiflexion- Left, Left steppage, and poor foot clearance- Left, Distance walked: 50 ft, Assistive device utilized:cane, and Level of assistance: SBA  FUNCTIONAL TESTS:  5 times sit to stand: 26.46 sec with BUE support Timed up and go (TUG): 25.44 sec no cane, unsteadiness with turns 10 meter walk test: 16.6 sec = 1.98 ft/sec Berg Balance test:32/56 (Scores <45/56 indicate increased fall risk)                                                                                                                                TREATMENT DATE: 11/08/2023    PATIENT EDUCATION: Education details: Eval results, POC, initial HEP Person educated: Patient Education method: Explanation, Demonstration, Verbal cues, and Handouts Education comprehension: verbalized understanding, returned demonstration, and needs further education  HOME EXERCISE PROGRAM: Access Code: [email or web address] URL: [email or web address] Date: 11/08/2023 Prepared by: [facility] - Outpatient  Rehab - [facility]  Exercises - Standing Gastroc Stretch at Counter  - 1-2 x daily - 7 x weekly - 1 sets - 3 reps - 30 sec hold - Seated Hamstring stretch  - 1-2 x daily - 7 x weekly - 1 sets - 3 reps - 30 sec hold  GOALS: Goals reviewed with patient? Yes  SHORT  TERM GOALS: Target date: 11/24/2023  Pt will be independent with HEP for improved balance, strength, gait. Baseline: Goal status: NOT MET 10/14/025  2.  Pt will improve 5x sit<>stand to less than or equal to 20 sec to demonstrate improved functional strength and transfer efficiency. Baseline: 26.46 sec>21.34 sec;  Goal status: NOT MET  LONG TERM GOALS: Target date: 12/22/2023  Pt will be independent with progression of HEP for improved strength, balance, gait  . Baseline:  Goal status: MET  2.  Pt will improve 5x sit<>stand to less than or equal to 15 sec to demonstrate improved functional strength and transfer efficiency. Baseline: 20 sec Goal status: NOT MET  3.  Pt will improve TUG score to less than or equal to 18 sec for decreased fall risk. Baseline: 25.44 sec;  16 sec Goal status: MET  4.  Pt will improve Berg score to at least 45/56 to decrease fall risk. Baseline: 32/56; 44/56 Goal status: NOT MET  5.  Pt will improve gait velocity to at least 2.3 ft/sec  for improved gait efficiency and safety. Baseline: 1.98 ft/sec; 2.0 ft/sec Goal status: NOT MET  6.  Pt will verbalize understanding of fall prevention in home environment. Baseline:  Goal status: IMET  ASSESSMENT:  CLINICAL IMPRESSION: Instructed in balance activities to facilitate postural control and proprioceptive awareness to improve safety with ADL.  Difficulty with postural perturbations to posterior with LOB and limited righting reactions.  POC details performed and reviewed with outcome measures revealing high risk for falls but significantly improved from start of care baseline.  Pt has not yet obtained AFO for his left foot drop but will have this in the near future.  Good proficiency to HEP and has since start attending [employer] almost daily for cardiovascular exercise.  Pt confident in self-management at this time and will DC to HEP/gym routine.  OBJECTIVE IMPAIRMENTS: Abnormal gait, decreased balance,  decreased mobility, difficulty walking, decreased ROM, decreased strength, impaired flexibility, and postural dysfunction.   ACTIVITY LIMITATIONS: standing, transfers, and locomotion level  PARTICIPATION LIMITATIONS: community activity and yard work  PERSONAL FACTORS: 3+ comorbidities: see above are also affecting patient's functional outcome.   REHAB POTENTIAL: Good  CLINICAL DECISION MAKING: Evolving/moderate complexity  EVALUATION COMPLEXITY: Moderate  PLAN:  PT FREQUENCY: 2x/week  PT DURATION: 6 weeks plus eval  PLANNED INTERVENTIONS: 97750- Physical Performance Testing, 97110-Therapeutic exercises, 97530- Therapeutic activity, [id number]- Neuromuscular re-education, 97535- Self Care, [id number]- Manual therapy, [id number]- Gait training, [phone]- Orthotic Initial, [phone]- Orthotic/Prosthetic subsequent, Patient/Family education, and Balance training  PLAN FOR NEXT SESSION: Anticipate D/C to HEP/gym routine  Set-up for Parkinson's screen in 6 months  TODAY'S TREATMENT: 12/18/23 Activity Comments  5xSTS 20 sec  TUG test  16 sec w/out AD 15 sec w/ cane  Berg Balance 44/56  10 meter walk = 16.22 sec w/ cane 2.0 ft/sec  -----no AFO present at this time           2:52 PM, 12/20/23 M. Kelly Dave Mergen, PT, DPT Physical Therapist- [place] Office Number: [phone]

## 2023-12-25 ENCOUNTER — Ambulatory Visit

## 2023-12-27 ENCOUNTER — Ambulatory Visit

## 2024-02-09 ENCOUNTER — Other Ambulatory Visit: Payer: Self-pay | Admitting: Family Medicine

## 2024-02-09 DIAGNOSIS — I10 Essential (primary) hypertension: Secondary | ICD-10-CM

## 2024-02-12 ENCOUNTER — Telehealth: Payer: Self-pay | Admitting: Family Medicine

## 2024-02-12 ENCOUNTER — Other Ambulatory Visit: Payer: Self-pay | Admitting: Family Medicine

## 2024-02-12 NOTE — Telephone Encounter (Signed)
 Patient came in stating he was used to taking the flomax  0.4 mg two tablets once daily. Patient states when he got refill, he saw the instructions were for once daily. Patient is requesting more insight as to why the dosage instructions have changed.

## 2024-02-12 NOTE — Telephone Encounter (Signed)
 Please advise

## 2024-02-13 NOTE — Telephone Encounter (Signed)
 Looks like the pharmacy changed the prescription. Please send in new prescription if needed for 2 capsules daily.

## 2024-02-14 NOTE — Telephone Encounter (Signed)
 Left detailed message on personal voicemail that Dr. Kennyth sent correct dose for Tamsulosin  to the pharmacy. Any questions call office.

## 2024-03-01 ENCOUNTER — Telehealth: Payer: Self-pay

## 2024-03-01 NOTE — Telephone Encounter (Signed)
 Pt walks in office waiting advise was seen at [facility] and they called him and stated that he has infection in urine and started him on abx not for sure which one but very powerful believes its bactrium.  And told him to stop his losartan  His provider is not here spoke with dr wendolyn about this and she stated that she could not see any of his levels in chart and that should could not go against what the [place] [facility] said and for him to do what they said the patient understood well.

## 2024-03-19 ENCOUNTER — Ambulatory Visit: Admitting: Podiatry

## 2024-06-06 ENCOUNTER — Ambulatory Visit: Admitting: Neurology

## 2024-07-25 ENCOUNTER — Ambulatory Visit

## 2024-07-25 ENCOUNTER — Ambulatory Visit: Attending: Family Medicine | Admitting: Occupational Therapy

## 2024-08-27 ENCOUNTER — Ambulatory Visit
